# Patient Record
Sex: Female | Born: 1950
Health system: Southern US, Community
[De-identification: ages and names within clinical notes are randomized; demographics above are authoritative.]

## PROBLEM LIST (undated history)

## (undated) ENCOUNTER — Emergency Department (HOSPITAL_COMMUNITY): Admission: EM | Payer: Medicare PPO | Source: Home / Self Care

## (undated) DIAGNOSIS — R51 Headache: Secondary | ICD-10-CM

## (undated) DIAGNOSIS — I639 Cerebral infarction, unspecified: Secondary | ICD-10-CM

## (undated) DIAGNOSIS — I499 Cardiac arrhythmia, unspecified: Secondary | ICD-10-CM

## (undated) DIAGNOSIS — K219 Gastro-esophageal reflux disease without esophagitis: Secondary | ICD-10-CM

## (undated) DIAGNOSIS — N186 End stage renal disease: Secondary | ICD-10-CM

## (undated) DIAGNOSIS — K5900 Constipation, unspecified: Secondary | ICD-10-CM

## (undated) DIAGNOSIS — I10 Essential (primary) hypertension: Secondary | ICD-10-CM

## (undated) DIAGNOSIS — E785 Hyperlipidemia, unspecified: Secondary | ICD-10-CM

## (undated) DIAGNOSIS — N189 Chronic kidney disease, unspecified: Secondary | ICD-10-CM

## (undated) DIAGNOSIS — R519 Headache, unspecified: Secondary | ICD-10-CM

## (undated) DIAGNOSIS — E119 Type 2 diabetes mellitus without complications: Secondary | ICD-10-CM

## (undated) DIAGNOSIS — M1990 Unspecified osteoarthritis, unspecified site: Secondary | ICD-10-CM

## (undated) HISTORY — DX: Hyperlipidemia, unspecified: E78.5

## (undated) HISTORY — DX: Gastro-esophageal reflux disease without esophagitis: K21.9

## (undated) HISTORY — PX: ABDOMINAL HYSTERECTOMY: SHX81

## (undated) HISTORY — DX: Essential (primary) hypertension: I10

## (undated) HISTORY — DX: Type 2 diabetes mellitus without complications: E11.9

---

## 2003-11-24 HISTORY — PX: CATARACT EXTRACTION: SUR2

## 2008-05-04 ENCOUNTER — Ambulatory Visit: Payer: Self-pay | Admitting: Cardiology

## 2008-06-27 ENCOUNTER — Ambulatory Visit: Payer: Self-pay | Admitting: Cardiology

## 2008-07-04 ENCOUNTER — Ambulatory Visit: Payer: Self-pay | Admitting: Cardiology

## 2008-07-23 ENCOUNTER — Ambulatory Visit: Payer: Self-pay

## 2008-07-23 ENCOUNTER — Encounter: Payer: Self-pay | Admitting: Cardiology

## 2008-07-31 ENCOUNTER — Ambulatory Visit: Payer: Self-pay | Admitting: Cardiology

## 2008-08-07 ENCOUNTER — Inpatient Hospital Stay (HOSPITAL_BASED_OUTPATIENT_CLINIC_OR_DEPARTMENT_OTHER): Admission: RE | Admit: 2008-08-07 | Discharge: 2008-08-07 | Payer: Self-pay | Admitting: Cardiology

## 2008-08-07 ENCOUNTER — Ambulatory Visit: Payer: Self-pay | Admitting: Cardiology

## 2011-04-07 NOTE — Assessment & Plan Note (Signed)
[place] OFFICE NOTE   NAME:MCCORKLEConchita, [name]                     MRN:          [id number]  DATE:06/27/2008                            DOB:          11-21-1951    PRIMARY CARDIOLOGIST:  Ernestine Mcmurray, MD, [facility]   REASON FOR VISIT:  Scheduled followup.   [name] returns to our clinic, after an initial referral to Dr.  Dannielle Burn this past June.  She was referred for further evaluation of chest  discomfort and dyspnea, in the setting of multiple cardiac risk factors,  with no prior history of heart disease.   Initial recommendation was to pursue a noninvasive workup consisting of  an exercise stress Cardiolite, as well as a 2-D echocardiogram.  Medications were adjusted with addition of Coreg for aggressive blood  pressure control.  Dr. Dannielle Burn also noted renal insufficiency with a  creatinine of 1.75, and suggested future evaluation with a CO2  angiogram, during a possible cardiac catheterization, to rule out renal  artery stenosis.   Unfortunately, [name] was not able to proceed with the recommended  tests.  She reports that she was told by this hospital that she would  have to present with $2000 prior to undergoing the procedures.  Given  her inability to afford this payment up front, she declined the testing.   Clinically, [name] does continue to have occasional chest  discomfort.  This seems to be most pronounced when she is doing  household chores, consisting of cleaning and vacuuming.  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  She has not had any in the past few  weeks or so.  She denies any symptoms at rest.   CURRENT MEDICATIONS:  1. Carvedilol 3.125 b.i.d.  2. Aspirin 325 daily.  3. Pravastatin 20 at bedtime.  4. Hydrochlorothiazide 25 daily.  5. Benazepril 40 daily.  6. Glipizide 10 b.i.d.  7.  Amlodipine 5 daily.  8. Insulin 70/30 40 units b.i.d.   PHYSICAL EXAMINATION:  VITAL SIGNS:  Blood pressure to 200/110  initially, repeat systolic Q000111Q.  Pulse 89, weight 194.  GENERAL:  A 60 year old female, obese, sitting upright, in no distress.  HEENT:  Normocephalic, atraumatic.  NECK:  Palpable carotid pulse without bruits; unable to assess JVD  secondary to neck girth.  LUNGS:  Clear to auscultation in all fields.  HEART:  RRR.  (S1S2).  Positive S4, a soft grade [id number] systolic ejection  murmur at the base.  No diastolic blow.  No rubs.  ABDOMEN:  Protuberant, nontender, intact bowel sounds.  EXTREMITIES:  Palpable bilateral femoral pulse without bruits; intact  distal pulses with no significant edema.  NEURO:  No focal deficits.   IMPRESSION:  1. Exertional angina pectoris.      a.     Worrisome for significant underlying coronary artery       disease.  2. Hypertension, uncontrolled.  3. Insulin-dependent diabetes mellitus.  4. Dyslipidemia.  5. Status post resting sinus tachycardia, improved.  6. Renal  insufficiency.  7. Systolic murmur.   PLAN:  1. Continue aggressive blood pressure management with uptitration of      Coreg to 6.25 b.i.d., and uptitration of Norvasc to 10 daily.  2. Add Imdur 30 daily for antianginal benefit.  3. Reschedule 2-D echo for assessment of LVF and exclusion of      significant valvular disease.  4. Schedule renal ultrasound with Dopplers, to be performed in our [place], to exclude renal artery stenosis.  We prefer to do this      noninvasive study, as opposed to a CO2 angiogram in conjunction      with a possible cardiac catheterization in the near future.  5. Following stabilization of blood pressure, and rule out of      significant renal artery stenosis, then plan is to proceed with a      diagnostic coronary angiogram and possible percutaneous      intervention.  The patient does, in fact, present with symptoms      worrisome  for significant underlying CAD.  The risks/benefits of      such a procedure were discussed with the patient, who would be      willing to proceed.  Prior to scheduling this, however, we will      have the patient return for early followup, including a nurse's      visit in 1 week for a repeat BP/pulse check.  We will then arrange      for the patient to return to our clinic to follow up with myself      and Dr. Dannielle Burn in 1 month, for discussion and timing of recommended      cardiac catheterization.     Mannie Stabile, PA-C  Electronically Signed      Satira Sark, MD  Electronically Signed   GS/MedQ  DD: 06/27/2008  DT: 06/28/2008  Job #: [id number]   cc:   Consuello Masse

## 2011-04-07 NOTE — Assessment & Plan Note (Signed)
[place] OFFICE NOTE   NAME:[name]                     MRN:          [id number]  DATE:07/31/2008                            DOB:          1950/11/24    [name] is a very pleasant 60 year old female with past medical  history of hypertension, diabetes, strong family history, and  hyperlipidemia.  She has been seen by Dr. Dannielle Burn recently for chest  pain.  A noninvasive workup was scheduled, but was not performed  initially.  She was seen by Mannie Stabile on June 27, 2008, with  continuing episodes of chest pain.  Note, she does have renal  insufficiency and was concerned about this and she was also concerned  about the cause previously.  Gene did order an echocardiogram, which was  performed on July 23, 2008.  Her LV function was normal.  There was no  significant valvular disease noted.  There was mild left atrial  enlargement.  He also ordered renal Dopplers to exclude renal artery  stenosis.  Her kidney size was normal, and there was no significant  renal artery stenosis.  Since then, she has continued to have occasional  chest pain.  The pain is in the left breast area described as a  pressure.  It does not radiate.  There is no associated nausea,  vomiting, shortness of breath, or diaphoresis.  It typically occurs with  more extreme activities, but not with routine activities.  It does not  typically occur with rest.  Last episode was approximately 1 week ago.  It lasts in 5-10 minutes and resolves.  There has also been some  adjustment of her present medications.  The plan previously has been to  adjust her medical regimen and then proceed with cardiac  catheterization.   PRESENT MEDICATIONS:  1. Insulin 70/30, 40 units in the morning and 40 units in the evening.  2. Glipizide 10 mg p.o. b.i.d.  3. Benazepril 40 mg p.o. daily.  4. Hydrochlorothiazide 25 mg p.o. daily.  5. Pravachol 20  mg p.o. daily.  6. Aspirin 325 mg p.o. daily.  7. Amlodipine 10 mg p.o. daily.  8. Carvedilol 6.25 mg p.o. b.i.d.  9. Imdur 30 mg p.o. daily.   PHYSICAL EXAMINATION:  VITAL SIGNS:  Blood pressure 130/81 and her pulse  is 86.  She weighs 192 pounds.  HEENT:  Normal.  NECK:  Supple.  CHEST:  Clear.  CARDIOVASCULAR:  Regular rate and rhythm.  ABDOMEN:  No tenderness.  EXTREMITIES:  No edema.   DIAGNOSES:  1. Chest pain concerning for angina - [name] continues to have      occasional chest pain.  This typically occurs with more extreme      activities, but not at rest.  Note, her previous creatinine in      early August was 1.7.  We will plan to proceed with cardiac      catheterization.  The risks and benefits have been discussed and      she agrees to proceed.  I will hold her hydrochlorothiazide the day  after procedure and the day prior to the procedure.  We will hold      her ACE inhibitor the day of the procedure as well.  We will plan      to bring her to the [facility] lab at approximately 6 o'clock in the      morning of the procedure and begin normal saline.  Now, we will not      proceed with an left ventriculogram as previous echo showed normal      left ventricular function.  We will need to repeat her BMET the day      after her procedure.  We will make further recommendations once we      know her anatomy.  2. Hypertension - Her blood pressure is adequately control and we will      continue her present medications.  3. Diabetes mellitus - This will be managed per primary care      physician.  4. Hyperlipidemia - She will continue on her statin.  This needs to be      followed and goal LDL should be less than 70.  5. Renal insufficiency - As per chest pain concerning for angina, we      will need to follow this closely following her procedure.   We will see her back in approximately 4 weeks.  In the interim, she will  have her cardiac catheterization.      Denice Bors Stanford Breed, MD, [facility]  Electronically Signed    BSC/MedQ  DD: 07/31/2008  DT: 08/01/2008  Job #: [id number]   cc:   Consuello Masse

## 2011-04-07 NOTE — Assessment & Plan Note (Signed)
[place] CARDIOLOGY OFFICE NOTE   NAME:MCCORKLEPhilicia, [name]                     MRN:          [id number]  DATE:05/04/2008                            DOB:          Mar 05, 1951    REFERRING PHYSICIAN:  Consuello Masse   REASON FOR CONSULTATION:  Evaluation of episodes of right arm tightness  and shortness of breath and chest pain.   HISTORY OF PRESENT ILLNESS:  The patient is a very pleasant 60 year old  female with multiple cardiac risk factors including hypertension,  diabetes mellitus, strong family history, and dyslipidemia.  The  patient's blood pressure of note remains uncontrolled.  The patient  states today she is feeling well, but last Sunday she had an episode  where she awoke at 3:00 a.m. with sensation of feeling sick.  She stated  she felt bad throughout the whole day until later that evening.  Her  symptoms predominantly included sudden onset of sensation of nausea  associated with dizziness and heavy substernal chest pain with pain  radiating to the right arm.  There was also accompanying dyspnea.  She  still did go to work that day and had intermittent right arm tightness.  Later that night, eventually her symptoms resolved.  She then saw Dr.  Quintin Alto as an outpatient who urged her to have her see [name] in  consultation.  Her EKG in the office today is within normal limits, but  does show a sinus tachycardia, but also the patient state that she has  felt for quite some time that her heart has been going fast.  She does  get exhausted upon minimal exertion.  She complains of pain in both legs  upon exertion, some improvement at rest, but also occurring at rest.   ALLERGIES:  No known drug allergies.   PAST MEDICAL HISTORY:  See problem list below.   SOCIAL HISTORY:  The patient does not smoke or drink.  She lives in  [place], [place].   FAMILY HISTORY:  Notable for mother died from dysphagia and father from  cancer.  She has three sisters, which are in good health.   REVIEW OF SYSTEMS:  As per HPI.  The patient denies currently any  nausea, vomiting, fever, chills, melena, hematochezia, dysuria, or  frequency, palpitations or syncope.  The remainder of the review of  systems is negative.   PHYSICAL EXAMINATION:  VITAL SIGNS:  Blood pressure 171/97 with heart  rate of 111 beats per minute and weight is 196 pounds.  NECK:  Normal carotid upstroke and no carotid bruits.  Neck is supple.  There is no thyromegaly.  HEENT:  Oropharynx and nasopharynx are within normal limits.  LYMPHATICS:  No lymphadenopathy in the neck, axilla, or groin.  LUNGS:  Clear breath sounds bilaterally.  HEART:  Regular rate and rhythm with normal S1 and S2.  No murmurs,  rubs, or gallops, but tachycardic.  ABDOMEN:  Soft and nontender with no rebound or guarding.  Good bowel  sounds.  EXTREMITIES:  No cyanosis, clubbing, or edema.  NEURO:  The patient is alert and oriented, grossly nonfocal.  Dorsalis  pedis and posterior tibialis pulses appear to be intact bilaterally.   LIST OF MEDICATIONS:  1. Insulin 70/30, 40 units in the a.m. and 40 units in the p.m.  2. Amlodipine 5 mg p.o. daily.  3. Glipizide 10 mg p.o. b.i.d.  4. Benazepril 40 mg p.o. daily.  5. Hydrochlorothiazide 25 mg p.o. daily.  6. Pravastatin 20 mg p.o. daily.  7. Aspirin 325 mg p.o. daily.   PROBLEM LIST:  1. Dyspnea and chest pain.      a.     Rule out ischemic heart disease.      b.     Rule out hypertensive heart disease.  2. Hypertension, poorly controlled.  3. Resting tachycardia.  4. Rule out hyperthyroidism.  5. Diabetes mellitus.  6. Dyslipidemia on lipid-lowering therapy.   PLAN:  1. I do share Dr. Edythe Lynn concern that the patient could have      underlying coronary artery disease, as she has a coronary artery      disease equivalent in the setting of diabetes and other multiple      cardiac risk factors.  2. We will proceed  initially with an exercise Cardiolite stress study      and if positive, the patient will need a cardiac catheterization.  3. Further modification of risk factors is necessary, particularly      with attention to blood pressure control.  I added Coreg to the      patient's medical regimen, which should be rapidly uptitrated for      control of heart rate and hypertension.  4. The patient may need evaluation of renal artery stenosis      particularly in light of her creatinine of 1.75.  However, I did      not order any Doppler studies, but will hold off on this because      the patient still may require cardiac catheterization and renal      artery scan then be assessed at that time with a CO2 angiogram.  5. The patient reports pain in the lower extremity, but I do not think      she has claudication.  Her pulses are palpable.  Again, we will      defer any further evaluation, pending a decision whether we need to      proceed with cardiac catheterization.  6. I gave the patient a prescription of nitroglycerin p.r.n.  In case,      she has another episode as described above, she knows now to come      to the emergency room.     Ernestine Mcmurray, MD,FACC  Electronically Signed    GED/MedQ  DD: 05/04/2008  DT: 05/04/2008  Job #: [id number]   cc:   Consuello Masse

## 2011-04-07 NOTE — Cardiovascular Report (Signed)
[name], AO            ACCOUNT NO.:  [id number]   MEDICAL RECORD NO.:  [id number]          PATIENT TYPE:  OIB   LOCATION:  1967                         FACILITY:  [facility]   PHYSICIAN:  Minus Breeding, MD, FACCDATE OF BIRTH:  08-14-1951   DATE OF PROCEDURE:  08/07/2008  DATE OF DISCHARGE:  08/07/2008                            CARDIAC CATHETERIZATION   PRIMARY CARE PHYSICIAN:  Dr. Consuello Masse.   REASON FOR PRESENTATION:  The patient with coronary artery disease and  chest discomfort suggestive of unstable angina.   PROCEDURE NOTE:  Left heart catheterization was performed via the right  femoral artery.  The artery was cannulated using anterior wall puncture.  A #4-French arterial sheath was inserted via the modified Seldinger  technique.  Preformed Judkins and pigtail catheter were utilized.  The  patient tolerated the procedure well and left the lab in stable  condition.   RESULTS:  Hemodynamics; LV 124/22, A1 31/98.  Coronaries; the left main was normal and was long.  The LAD was normal.  There were 2 small diagonals that looked normal.  The circumflex in the  AV groove was normal.  The ramus intermediate was large and normal.  An  obtuse marginal 1 was large and normal.  The right coronary artery was a  dominant vessel.  There was a moderate-sized PDA, which was normal.  There was a posterolateral, which was moderate sized and normal.  The  left ventricle was not injected secondary to renal insufficiency.  We  did cross for pressures.   CONCLUSION:  Normal coronary arteries.   PLAN:  No further cardiac workup is suggested.  The patient will follow  with Dr. Quintin Alto and Dr. Stanford Breed.      Minus Breeding, MD, [facility]  Electronically Signed     JH/MEDQ  D:  11/08/2008  T:  11/08/2008  Job:  [id number]   cc:   Consuello Masse

## 2012-03-17 DIAGNOSIS — I43 Cardiomyopathy in diseases classified elsewhere: Secondary | ICD-10-CM | POA: Diagnosis not present

## 2012-03-17 DIAGNOSIS — IMO0001 Reserved for inherently not codable concepts without codable children: Secondary | ICD-10-CM | POA: Diagnosis not present

## 2012-03-17 DIAGNOSIS — E7800 Pure hypercholesterolemia, unspecified: Secondary | ICD-10-CM | POA: Diagnosis not present

## 2012-03-17 DIAGNOSIS — I10 Essential (primary) hypertension: Secondary | ICD-10-CM | POA: Diagnosis not present

## 2012-03-17 DIAGNOSIS — N189 Chronic kidney disease, unspecified: Secondary | ICD-10-CM | POA: Diagnosis not present

## 2012-03-18 DIAGNOSIS — E7800 Pure hypercholesterolemia, unspecified: Secondary | ICD-10-CM | POA: Diagnosis not present

## 2012-07-21 DIAGNOSIS — E7800 Pure hypercholesterolemia, unspecified: Secondary | ICD-10-CM | POA: Diagnosis not present

## 2012-07-21 DIAGNOSIS — I10 Essential (primary) hypertension: Secondary | ICD-10-CM | POA: Diagnosis not present

## 2012-07-21 DIAGNOSIS — N189 Chronic kidney disease, unspecified: Secondary | ICD-10-CM | POA: Diagnosis not present

## 2012-07-21 DIAGNOSIS — IMO0001 Reserved for inherently not codable concepts without codable children: Secondary | ICD-10-CM | POA: Diagnosis not present

## 2012-07-22 DIAGNOSIS — IMO0001 Reserved for inherently not codable concepts without codable children: Secondary | ICD-10-CM | POA: Diagnosis not present

## 2013-05-29 DIAGNOSIS — R404 Transient alteration of awareness: Secondary | ICD-10-CM | POA: Diagnosis not present

## 2013-05-29 DIAGNOSIS — I10 Essential (primary) hypertension: Secondary | ICD-10-CM | POA: Diagnosis not present

## 2013-05-29 DIAGNOSIS — Z794 Long term (current) use of insulin: Secondary | ICD-10-CM | POA: Diagnosis not present

## 2013-05-29 DIAGNOSIS — N289 Disorder of kidney and ureter, unspecified: Secondary | ICD-10-CM | POA: Diagnosis not present

## 2013-05-29 DIAGNOSIS — E1169 Type 2 diabetes mellitus with other specified complication: Secondary | ICD-10-CM | POA: Diagnosis not present

## 2013-07-27 DIAGNOSIS — IMO0001 Reserved for inherently not codable concepts without codable children: Secondary | ICD-10-CM | POA: Diagnosis not present

## 2013-07-27 DIAGNOSIS — I43 Cardiomyopathy in diseases classified elsewhere: Secondary | ICD-10-CM | POA: Diagnosis not present

## 2013-07-27 DIAGNOSIS — N189 Chronic kidney disease, unspecified: Secondary | ICD-10-CM | POA: Diagnosis not present

## 2013-07-27 DIAGNOSIS — Z23 Encounter for immunization: Secondary | ICD-10-CM | POA: Diagnosis not present

## 2013-07-27 DIAGNOSIS — I10 Essential (primary) hypertension: Secondary | ICD-10-CM | POA: Diagnosis not present

## 2013-07-27 DIAGNOSIS — E7800 Pure hypercholesterolemia, unspecified: Secondary | ICD-10-CM | POA: Diagnosis not present

## 2013-07-28 DIAGNOSIS — IMO0001 Reserved for inherently not codable concepts without codable children: Secondary | ICD-10-CM | POA: Diagnosis not present

## 2013-07-28 DIAGNOSIS — E119 Type 2 diabetes mellitus without complications: Secondary | ICD-10-CM | POA: Diagnosis not present

## 2013-07-28 DIAGNOSIS — N189 Chronic kidney disease, unspecified: Secondary | ICD-10-CM | POA: Diagnosis not present

## 2013-07-28 DIAGNOSIS — I10 Essential (primary) hypertension: Secondary | ICD-10-CM | POA: Diagnosis not present

## 2013-07-28 DIAGNOSIS — E7800 Pure hypercholesterolemia, unspecified: Secondary | ICD-10-CM | POA: Diagnosis not present

## 2013-11-21 DIAGNOSIS — E7800 Pure hypercholesterolemia, unspecified: Secondary | ICD-10-CM | POA: Diagnosis not present

## 2013-11-21 DIAGNOSIS — I43 Cardiomyopathy in diseases classified elsewhere: Secondary | ICD-10-CM | POA: Diagnosis not present

## 2013-11-21 DIAGNOSIS — IMO0001 Reserved for inherently not codable concepts without codable children: Secondary | ICD-10-CM | POA: Diagnosis not present

## 2013-11-21 DIAGNOSIS — I10 Essential (primary) hypertension: Secondary | ICD-10-CM | POA: Diagnosis not present

## 2013-11-21 DIAGNOSIS — N189 Chronic kidney disease, unspecified: Secondary | ICD-10-CM | POA: Diagnosis not present

## 2013-11-28 DIAGNOSIS — E7800 Pure hypercholesterolemia, unspecified: Secondary | ICD-10-CM | POA: Diagnosis not present

## 2013-11-28 DIAGNOSIS — I43 Cardiomyopathy in diseases classified elsewhere: Secondary | ICD-10-CM | POA: Diagnosis not present

## 2013-11-28 DIAGNOSIS — I10 Essential (primary) hypertension: Secondary | ICD-10-CM | POA: Diagnosis not present

## 2013-11-28 DIAGNOSIS — IMO0001 Reserved for inherently not codable concepts without codable children: Secondary | ICD-10-CM | POA: Diagnosis not present

## 2013-11-28 DIAGNOSIS — N189 Chronic kidney disease, unspecified: Secondary | ICD-10-CM | POA: Diagnosis not present

## 2014-03-23 DIAGNOSIS — N189 Chronic kidney disease, unspecified: Secondary | ICD-10-CM | POA: Diagnosis not present

## 2014-03-23 DIAGNOSIS — IMO0001 Reserved for inherently not codable concepts without codable children: Secondary | ICD-10-CM | POA: Diagnosis not present

## 2014-03-23 DIAGNOSIS — I10 Essential (primary) hypertension: Secondary | ICD-10-CM | POA: Diagnosis not present

## 2014-03-23 DIAGNOSIS — E7800 Pure hypercholesterolemia, unspecified: Secondary | ICD-10-CM | POA: Diagnosis not present

## 2014-03-29 DIAGNOSIS — E7800 Pure hypercholesterolemia, unspecified: Secondary | ICD-10-CM | POA: Diagnosis not present

## 2014-03-29 DIAGNOSIS — I10 Essential (primary) hypertension: Secondary | ICD-10-CM | POA: Diagnosis not present

## 2014-03-29 DIAGNOSIS — IMO0001 Reserved for inherently not codable concepts without codable children: Secondary | ICD-10-CM | POA: Diagnosis not present

## 2014-03-29 DIAGNOSIS — N189 Chronic kidney disease, unspecified: Secondary | ICD-10-CM | POA: Diagnosis not present

## 2014-03-29 DIAGNOSIS — I43 Cardiomyopathy in diseases classified elsewhere: Secondary | ICD-10-CM | POA: Diagnosis not present

## 2014-07-23 DIAGNOSIS — IMO0001 Reserved for inherently not codable concepts without codable children: Secondary | ICD-10-CM | POA: Diagnosis not present

## 2014-07-23 DIAGNOSIS — E7800 Pure hypercholesterolemia, unspecified: Secondary | ICD-10-CM | POA: Diagnosis not present

## 2014-07-23 DIAGNOSIS — I43 Cardiomyopathy in diseases classified elsewhere: Secondary | ICD-10-CM | POA: Diagnosis not present

## 2014-07-23 DIAGNOSIS — I10 Essential (primary) hypertension: Secondary | ICD-10-CM | POA: Diagnosis not present

## 2014-07-23 DIAGNOSIS — N189 Chronic kidney disease, unspecified: Secondary | ICD-10-CM | POA: Diagnosis not present

## 2014-07-31 DIAGNOSIS — I43 Cardiomyopathy in diseases classified elsewhere: Secondary | ICD-10-CM | POA: Diagnosis not present

## 2014-07-31 DIAGNOSIS — N189 Chronic kidney disease, unspecified: Secondary | ICD-10-CM | POA: Diagnosis not present

## 2014-07-31 DIAGNOSIS — I10 Essential (primary) hypertension: Secondary | ICD-10-CM | POA: Diagnosis not present

## 2014-07-31 DIAGNOSIS — E7800 Pure hypercholesterolemia, unspecified: Secondary | ICD-10-CM | POA: Diagnosis not present

## 2014-07-31 DIAGNOSIS — IMO0001 Reserved for inherently not codable concepts without codable children: Secondary | ICD-10-CM | POA: Diagnosis not present

## 2014-09-25 DIAGNOSIS — S161XXA Strain of muscle, fascia and tendon at neck level, initial encounter: Secondary | ICD-10-CM | POA: Diagnosis not present

## 2014-11-19 DIAGNOSIS — E780 Pure hypercholesterolemia: Secondary | ICD-10-CM | POA: Diagnosis not present

## 2014-11-19 DIAGNOSIS — E1165 Type 2 diabetes mellitus with hyperglycemia: Secondary | ICD-10-CM | POA: Diagnosis not present

## 2014-11-19 DIAGNOSIS — I10 Essential (primary) hypertension: Secondary | ICD-10-CM | POA: Diagnosis not present

## 2014-11-28 DIAGNOSIS — Z1389 Encounter for screening for other disorder: Secondary | ICD-10-CM | POA: Diagnosis not present

## 2014-11-28 DIAGNOSIS — E780 Pure hypercholesterolemia: Secondary | ICD-10-CM | POA: Diagnosis not present

## 2014-11-28 DIAGNOSIS — I43 Cardiomyopathy in diseases classified elsewhere: Secondary | ICD-10-CM | POA: Diagnosis not present

## 2014-11-28 DIAGNOSIS — I10 Essential (primary) hypertension: Secondary | ICD-10-CM | POA: Diagnosis not present

## 2014-11-28 DIAGNOSIS — N184 Chronic kidney disease, stage 4 (severe): Secondary | ICD-10-CM | POA: Diagnosis not present

## 2014-11-28 DIAGNOSIS — E1165 Type 2 diabetes mellitus with hyperglycemia: Secondary | ICD-10-CM | POA: Diagnosis not present

## 2015-04-01 DIAGNOSIS — I10 Essential (primary) hypertension: Secondary | ICD-10-CM | POA: Diagnosis not present

## 2015-04-01 DIAGNOSIS — E780 Pure hypercholesterolemia: Secondary | ICD-10-CM | POA: Diagnosis not present

## 2015-04-01 DIAGNOSIS — E1165 Type 2 diabetes mellitus with hyperglycemia: Secondary | ICD-10-CM | POA: Diagnosis not present

## 2015-04-01 DIAGNOSIS — N184 Chronic kidney disease, stage 4 (severe): Secondary | ICD-10-CM | POA: Diagnosis not present

## 2015-04-04 DIAGNOSIS — I10 Essential (primary) hypertension: Secondary | ICD-10-CM | POA: Diagnosis not present

## 2015-04-04 DIAGNOSIS — E780 Pure hypercholesterolemia: Secondary | ICD-10-CM | POA: Diagnosis not present

## 2015-04-04 DIAGNOSIS — N184 Chronic kidney disease, stage 4 (severe): Secondary | ICD-10-CM | POA: Diagnosis not present

## 2015-04-04 DIAGNOSIS — E1165 Type 2 diabetes mellitus with hyperglycemia: Secondary | ICD-10-CM | POA: Diagnosis not present

## 2015-04-04 DIAGNOSIS — Z23 Encounter for immunization: Secondary | ICD-10-CM | POA: Diagnosis not present

## 2015-04-04 DIAGNOSIS — I43 Cardiomyopathy in diseases classified elsewhere: Secondary | ICD-10-CM | POA: Diagnosis not present

## 2015-08-06 DIAGNOSIS — I10 Essential (primary) hypertension: Secondary | ICD-10-CM | POA: Diagnosis not present

## 2015-08-06 DIAGNOSIS — N184 Chronic kidney disease, stage 4 (severe): Secondary | ICD-10-CM | POA: Diagnosis not present

## 2015-08-06 DIAGNOSIS — E1165 Type 2 diabetes mellitus with hyperglycemia: Secondary | ICD-10-CM | POA: Diagnosis not present

## 2015-08-06 DIAGNOSIS — E780 Pure hypercholesterolemia: Secondary | ICD-10-CM | POA: Diagnosis not present

## 2015-08-13 DIAGNOSIS — I10 Essential (primary) hypertension: Secondary | ICD-10-CM | POA: Diagnosis not present

## 2015-08-13 DIAGNOSIS — E1122 Type 2 diabetes mellitus with diabetic chronic kidney disease: Secondary | ICD-10-CM | POA: Diagnosis not present

## 2015-08-13 DIAGNOSIS — N184 Chronic kidney disease, stage 4 (severe): Secondary | ICD-10-CM | POA: Diagnosis not present

## 2015-08-13 DIAGNOSIS — E1165 Type 2 diabetes mellitus with hyperglycemia: Secondary | ICD-10-CM | POA: Diagnosis not present

## 2015-08-13 DIAGNOSIS — I43 Cardiomyopathy in diseases classified elsewhere: Secondary | ICD-10-CM | POA: Diagnosis not present

## 2015-08-13 DIAGNOSIS — K210 Gastro-esophageal reflux disease with esophagitis: Secondary | ICD-10-CM | POA: Diagnosis not present

## 2015-08-13 DIAGNOSIS — H6983 Other specified disorders of Eustachian tube, bilateral: Secondary | ICD-10-CM | POA: Diagnosis not present

## 2015-08-13 DIAGNOSIS — E780 Pure hypercholesterolemia: Secondary | ICD-10-CM | POA: Diagnosis not present

## 2015-08-13 DIAGNOSIS — R1314 Dysphagia, pharyngoesophageal phase: Secondary | ICD-10-CM | POA: Diagnosis not present

## 2015-09-12 DIAGNOSIS — Z23 Encounter for immunization: Secondary | ICD-10-CM | POA: Diagnosis not present

## 2015-10-14 DIAGNOSIS — R1310 Dysphagia, unspecified: Secondary | ICD-10-CM | POA: Diagnosis not present

## 2015-11-11 DIAGNOSIS — R1310 Dysphagia, unspecified: Secondary | ICD-10-CM | POA: Diagnosis not present

## 2015-11-13 DIAGNOSIS — M1990 Unspecified osteoarthritis, unspecified site: Secondary | ICD-10-CM | POA: Diagnosis not present

## 2015-11-13 DIAGNOSIS — Z7982 Long term (current) use of aspirin: Secondary | ICD-10-CM | POA: Diagnosis not present

## 2015-11-13 DIAGNOSIS — T189XXA Foreign body of alimentary tract, part unspecified, initial encounter: Secondary | ICD-10-CM | POA: Diagnosis not present

## 2015-11-13 DIAGNOSIS — K449 Diaphragmatic hernia without obstruction or gangrene: Secondary | ICD-10-CM | POA: Diagnosis not present

## 2015-11-13 DIAGNOSIS — K222 Esophageal obstruction: Secondary | ICD-10-CM | POA: Diagnosis not present

## 2015-11-13 DIAGNOSIS — Z8249 Family history of ischemic heart disease and other diseases of the circulatory system: Secondary | ICD-10-CM | POA: Diagnosis not present

## 2015-11-13 DIAGNOSIS — G629 Polyneuropathy, unspecified: Secondary | ICD-10-CM | POA: Diagnosis not present

## 2015-11-13 DIAGNOSIS — I10 Essential (primary) hypertension: Secondary | ICD-10-CM | POA: Diagnosis not present

## 2015-11-13 DIAGNOSIS — K219 Gastro-esophageal reflux disease without esophagitis: Secondary | ICD-10-CM | POA: Diagnosis not present

## 2015-11-13 DIAGNOSIS — Z79899 Other long term (current) drug therapy: Secondary | ICD-10-CM | POA: Diagnosis not present

## 2015-11-13 DIAGNOSIS — E119 Type 2 diabetes mellitus without complications: Secondary | ICD-10-CM | POA: Diagnosis not present

## 2015-11-13 DIAGNOSIS — R1310 Dysphagia, unspecified: Secondary | ICD-10-CM | POA: Diagnosis not present

## 2015-11-13 DIAGNOSIS — E785 Hyperlipidemia, unspecified: Secondary | ICD-10-CM | POA: Diagnosis not present

## 2015-11-13 DIAGNOSIS — Z794 Long term (current) use of insulin: Secondary | ICD-10-CM | POA: Diagnosis not present

## 2015-11-13 DIAGNOSIS — Z833 Family history of diabetes mellitus: Secondary | ICD-10-CM | POA: Diagnosis not present

## 2015-11-13 DIAGNOSIS — Z7951 Long term (current) use of inhaled steroids: Secondary | ICD-10-CM | POA: Diagnosis not present

## 2018-03-03 DIAGNOSIS — N184 Chronic kidney disease, stage 4 (severe): Secondary | ICD-10-CM | POA: Diagnosis present

## 2018-03-03 DIAGNOSIS — I129 Hypertensive chronic kidney disease with stage 1 through stage 4 chronic kidney disease, or unspecified chronic kidney disease: Secondary | ICD-10-CM | POA: Diagnosis not present

## 2018-03-03 DIAGNOSIS — N189 Chronic kidney disease, unspecified: Secondary | ICD-10-CM | POA: Diagnosis not present

## 2018-03-03 DIAGNOSIS — E11649 Type 2 diabetes mellitus with hypoglycemia without coma: Secondary | ICD-10-CM | POA: Diagnosis not present

## 2018-03-03 DIAGNOSIS — N179 Acute kidney failure, unspecified: Secondary | ICD-10-CM | POA: Diagnosis not present

## 2018-03-03 DIAGNOSIS — Z794 Long term (current) use of insulin: Secondary | ICD-10-CM | POA: Diagnosis not present

## 2018-03-03 DIAGNOSIS — K219 Gastro-esophageal reflux disease without esophagitis: Secondary | ICD-10-CM | POA: Diagnosis not present

## 2018-03-03 DIAGNOSIS — R531 Weakness: Secondary | ICD-10-CM | POA: Diagnosis present

## 2018-03-03 DIAGNOSIS — R4182 Altered mental status, unspecified: Secondary | ICD-10-CM | POA: Diagnosis not present

## 2018-03-03 DIAGNOSIS — D649 Anemia, unspecified: Secondary | ICD-10-CM | POA: Diagnosis not present

## 2018-03-03 DIAGNOSIS — R809 Proteinuria, unspecified: Secondary | ICD-10-CM | POA: Diagnosis not present

## 2018-03-03 DIAGNOSIS — I10 Essential (primary) hypertension: Secondary | ICD-10-CM | POA: Diagnosis not present

## 2018-03-03 DIAGNOSIS — R42 Dizziness and giddiness: Secondary | ICD-10-CM | POA: Diagnosis not present

## 2018-03-03 DIAGNOSIS — M1990 Unspecified osteoarthritis, unspecified site: Secondary | ICD-10-CM | POA: Diagnosis present

## 2018-03-03 DIAGNOSIS — E1122 Type 2 diabetes mellitus with diabetic chronic kidney disease: Secondary | ICD-10-CM | POA: Diagnosis not present

## 2018-03-03 DIAGNOSIS — E785 Hyperlipidemia, unspecified: Secondary | ICD-10-CM | POA: Diagnosis present

## 2018-03-03 DIAGNOSIS — E1129 Type 2 diabetes mellitus with other diabetic kidney complication: Secondary | ICD-10-CM | POA: Diagnosis not present

## 2018-03-03 DIAGNOSIS — E1140 Type 2 diabetes mellitus with diabetic neuropathy, unspecified: Secondary | ICD-10-CM | POA: Diagnosis present

## 2018-03-03 DIAGNOSIS — Z79899 Other long term (current) drug therapy: Secondary | ICD-10-CM | POA: Diagnosis not present

## 2018-03-03 DIAGNOSIS — Z7982 Long term (current) use of aspirin: Secondary | ICD-10-CM | POA: Diagnosis not present

## 2018-03-03 DIAGNOSIS — I160 Hypertensive urgency: Secondary | ICD-10-CM | POA: Diagnosis present

## 2018-03-28 DIAGNOSIS — R809 Proteinuria, unspecified: Secondary | ICD-10-CM | POA: Diagnosis not present

## 2018-03-28 DIAGNOSIS — Z79899 Other long term (current) drug therapy: Secondary | ICD-10-CM | POA: Diagnosis not present

## 2018-03-28 DIAGNOSIS — E559 Vitamin D deficiency, unspecified: Secondary | ICD-10-CM | POA: Diagnosis not present

## 2018-03-28 DIAGNOSIS — N183 Chronic kidney disease, stage 3 (moderate): Secondary | ICD-10-CM | POA: Diagnosis not present

## 2018-03-28 DIAGNOSIS — D509 Iron deficiency anemia, unspecified: Secondary | ICD-10-CM | POA: Diagnosis not present

## 2018-03-29 DIAGNOSIS — R809 Proteinuria, unspecified: Secondary | ICD-10-CM | POA: Diagnosis not present

## 2018-03-29 DIAGNOSIS — M9080 Osteopathy in diseases classified elsewhere, unspecified site: Secondary | ICD-10-CM | POA: Diagnosis not present

## 2018-03-29 DIAGNOSIS — D631 Anemia in chronic kidney disease: Secondary | ICD-10-CM | POA: Diagnosis not present

## 2018-03-29 DIAGNOSIS — N185 Chronic kidney disease, stage 5: Secondary | ICD-10-CM | POA: Diagnosis not present

## 2018-03-29 DIAGNOSIS — E889 Metabolic disorder, unspecified: Secondary | ICD-10-CM | POA: Diagnosis not present

## 2018-03-29 DIAGNOSIS — E1121 Type 2 diabetes mellitus with diabetic nephropathy: Secondary | ICD-10-CM | POA: Diagnosis not present

## 2018-03-29 DIAGNOSIS — I10 Essential (primary) hypertension: Secondary | ICD-10-CM | POA: Diagnosis not present

## 2018-03-29 DIAGNOSIS — E875 Hyperkalemia: Secondary | ICD-10-CM | POA: Diagnosis not present

## 2018-04-03 DIAGNOSIS — I10 Essential (primary) hypertension: Secondary | ICD-10-CM | POA: Insufficient documentation

## 2018-04-03 DIAGNOSIS — E1121 Type 2 diabetes mellitus with diabetic nephropathy: Secondary | ICD-10-CM | POA: Insufficient documentation

## 2018-04-03 DIAGNOSIS — D631 Anemia in chronic kidney disease: Secondary | ICD-10-CM | POA: Insufficient documentation

## 2018-04-03 DIAGNOSIS — N189 Chronic kidney disease, unspecified: Secondary | ICD-10-CM | POA: Insufficient documentation

## 2018-04-06 DIAGNOSIS — E1122 Type 2 diabetes mellitus with diabetic chronic kidney disease: Secondary | ICD-10-CM | POA: Diagnosis not present

## 2018-04-06 DIAGNOSIS — E1165 Type 2 diabetes mellitus with hyperglycemia: Secondary | ICD-10-CM | POA: Diagnosis not present

## 2018-04-06 DIAGNOSIS — I10 Essential (primary) hypertension: Secondary | ICD-10-CM | POA: Diagnosis not present

## 2018-04-06 DIAGNOSIS — N184 Chronic kidney disease, stage 4 (severe): Secondary | ICD-10-CM | POA: Diagnosis not present

## 2018-04-06 DIAGNOSIS — Z6827 Body mass index (BMI) 27.0-27.9, adult: Secondary | ICD-10-CM | POA: Diagnosis not present

## 2018-04-11 DIAGNOSIS — N183 Chronic kidney disease, stage 3 (moderate): Secondary | ICD-10-CM | POA: Diagnosis not present

## 2018-04-11 DIAGNOSIS — D509 Iron deficiency anemia, unspecified: Secondary | ICD-10-CM | POA: Diagnosis not present

## 2018-04-11 DIAGNOSIS — R809 Proteinuria, unspecified: Secondary | ICD-10-CM | POA: Diagnosis not present

## 2018-04-11 DIAGNOSIS — E559 Vitamin D deficiency, unspecified: Secondary | ICD-10-CM | POA: Diagnosis not present

## 2018-04-11 DIAGNOSIS — Z79899 Other long term (current) drug therapy: Secondary | ICD-10-CM | POA: Diagnosis not present

## 2018-04-12 DIAGNOSIS — E1121 Type 2 diabetes mellitus with diabetic nephropathy: Secondary | ICD-10-CM | POA: Diagnosis not present

## 2018-04-12 DIAGNOSIS — D631 Anemia in chronic kidney disease: Secondary | ICD-10-CM | POA: Diagnosis not present

## 2018-04-12 DIAGNOSIS — R809 Proteinuria, unspecified: Secondary | ICD-10-CM | POA: Diagnosis not present

## 2018-04-12 DIAGNOSIS — E875 Hyperkalemia: Secondary | ICD-10-CM | POA: Diagnosis not present

## 2018-04-12 DIAGNOSIS — E889 Metabolic disorder, unspecified: Secondary | ICD-10-CM | POA: Diagnosis not present

## 2018-04-12 DIAGNOSIS — I10 Essential (primary) hypertension: Secondary | ICD-10-CM | POA: Diagnosis not present

## 2018-04-12 DIAGNOSIS — M9080 Osteopathy in diseases classified elsewhere, unspecified site: Secondary | ICD-10-CM | POA: Diagnosis not present

## 2018-04-12 DIAGNOSIS — N185 Chronic kidney disease, stage 5: Secondary | ICD-10-CM | POA: Diagnosis not present

## 2018-04-13 DIAGNOSIS — E875 Hyperkalemia: Secondary | ICD-10-CM | POA: Diagnosis not present

## 2018-05-03 ENCOUNTER — Other Ambulatory Visit: Payer: Self-pay

## 2018-05-03 DIAGNOSIS — N185 Chronic kidney disease, stage 5: Secondary | ICD-10-CM

## 2018-05-24 DIAGNOSIS — D638 Anemia in other chronic diseases classified elsewhere: Secondary | ICD-10-CM | POA: Diagnosis not present

## 2018-05-24 DIAGNOSIS — N185 Chronic kidney disease, stage 5: Secondary | ICD-10-CM | POA: Diagnosis not present

## 2018-05-24 DIAGNOSIS — E875 Hyperkalemia: Secondary | ICD-10-CM | POA: Diagnosis not present

## 2018-05-24 DIAGNOSIS — E889 Metabolic disorder, unspecified: Secondary | ICD-10-CM | POA: Diagnosis not present

## 2018-05-24 DIAGNOSIS — M9080 Osteopathy in diseases classified elsewhere, unspecified site: Secondary | ICD-10-CM | POA: Diagnosis not present

## 2018-06-07 ENCOUNTER — Ambulatory Visit (HOSPITAL_COMMUNITY)
Admission: RE | Admit: 2018-06-07 | Discharge: 2018-06-07 | Disposition: A | Discharge status: Home / Self Care | Payer: Medicare PPO | Source: Ambulatory Visit | Attending: Vascular Surgery | Admitting: Vascular Surgery

## 2018-06-07 ENCOUNTER — Ambulatory Visit (INDEPENDENT_AMBULATORY_CARE_PROVIDER_SITE_OTHER)
Admission: RE | Admit: 2018-06-07 | Discharge: 2018-06-07 | Disposition: A | Discharge status: Home / Self Care | Payer: Medicare PPO | Source: Ambulatory Visit | Attending: Vascular Surgery | Admitting: Vascular Surgery

## 2018-06-07 ENCOUNTER — Other Ambulatory Visit: Payer: Self-pay

## 2018-06-07 ENCOUNTER — Other Ambulatory Visit: Payer: Self-pay | Admitting: *Deleted

## 2018-06-07 ENCOUNTER — Encounter: Payer: Self-pay | Admitting: Vascular Surgery

## 2018-06-07 ENCOUNTER — Encounter: Payer: Self-pay | Admitting: *Deleted

## 2018-06-07 ENCOUNTER — Ambulatory Visit: Payer: Medicare PPO | Admitting: Vascular Surgery

## 2018-06-07 VITALS — BP 186/112 | HR 97 | Temp 97.9°F | Resp 16 | Ht 67.0 in | Wt 154.0 lb

## 2018-06-07 DIAGNOSIS — N185 Chronic kidney disease, stage 5: Secondary | ICD-10-CM

## 2018-06-07 DIAGNOSIS — Z01818 Encounter for other preprocedural examination: Secondary | ICD-10-CM | POA: Diagnosis not present

## 2018-06-07 DIAGNOSIS — I70208 Unspecified atherosclerosis of native arteries of extremities, other extremity: Secondary | ICD-10-CM | POA: Insufficient documentation

## 2018-06-07 DIAGNOSIS — I82713 Chronic embolism and thrombosis of superficial veins of upper extremity, bilateral: Secondary | ICD-10-CM | POA: Insufficient documentation

## 2018-06-07 DIAGNOSIS — I120 Hypertensive chronic kidney disease with stage 5 chronic kidney disease or end stage renal disease: Secondary | ICD-10-CM | POA: Insufficient documentation

## 2018-06-07 NOTE — Progress Notes (Signed)
Vascular and Vein Specialist of [facility]  Patient name: [name] MRN: [id number] DOB: May 15, 1951 Sex: female  REASON FOR CONSULT: Gus access for hemodialysis  HPI: [name] is a 67 y.o. female, who is here today for discussion of access for hemodialysis.  She is not currently on hemodialysis but is approaching need for this.  She is here today with her sister-in-law.  She has had no prior access placement and does not have a pacemaker.  She is right-handed.  She does not have any uremic symptoms currently.  Past Medical History:  Diagnosis Date  . Diabetes mellitus without complication ([place])   . GERD (gastroesophageal reflux disease)   . Hyperlipidemia   . Hypertension     History reviewed. No pertinent family history.  SOCIAL HISTORY: Social History   Socioeconomic History  . Marital status: Single    Spouse name: Not on file  . Number of children: Not on file  . Years of education: Not on file  . Highest education level: Not on file  Occupational History  . Not on file  Social Needs  . Financial resource strain: Not on file  . Food insecurity:    Worry: Not on file    Inability: Not on file  . Transportation needs:    Medical: Not on file    Non-medical: Not on file  Tobacco Use  . Smoking status: Never Smoker  . Smokeless tobacco: Never Used  Substance and Sexual Activity  . Alcohol use: Never    Frequency: Never  . Drug use: Never  . Sexual activity: Not on file  Lifestyle  . Physical activity:    Days per week: Not on file    Minutes per session: Not on file  . Stress: Not on file  Relationships  . Social connections:    Talks on phone: Not on file    Gets together: Not on file    Attends religious service: Not on file    Active member of club or organization: Not on file    Attends meetings of clubs or organizations: Not on file    Relationship status: Not on file  . Intimate partner violence:   Fear of current or ex partner: Not on file    Emotionally abused: Not on file    Physically abused: Not on file    Forced sexual activity: Not on file  Other Topics Concern  . Not on file  Social History Narrative  . Not on file    No Known Allergies  Current Outpatient Medications  Medication Sig Dispense Refill  . amlodipine-atorvastatin (CADUET) 10-10 MG tablet Take 1 tablet by mouth daily.    Marland Kitchen aspirin EC 81 MG tablet Take 81 mg by mouth daily.    . calcitRIOL (ROCALTROL) 0.25 MCG capsule Take 0.25 mcg by mouth daily.    . calcium-vitamin D (OSCAL WITH D) 500-200 MG-UNIT tablet Take 1 tablet by mouth 2 (two) times daily.    . fluticasone (FLONASE) 50 MCG/ACT nasal spray Place into both nostrils once as needed for allergies or rhinitis.    Marland Kitchen insulin NPH-regular Human (NOVOLIN 70/30) (70-30) 100 UNIT/ML injection Inject 40 Units into the skin at bedtime.    . isosorbide mononitrate (IMDUR) 30 MG 24 hr tablet Take 30 mg by mouth daily.    . pantoprazole (PROTONIX) 40 MG tablet Take 40 mg by mouth daily.    . VELTASSA 8.4 g packet Take by mouth daily.     No  current facility-administered medications for this visit.     REVIEW OF SYSTEMS:  [X]  denotes positive finding, [ ]  denotes negative finding Cardiac  Comments:  Chest pain or chest pressure: x   Shortness of breath upon exertion: x   Short of breath when lying flat:    Irregular heart rhythm:        Vascular    Pain in calf, thigh, or hip brought on by ambulation: x   Pain in feet at night that wakes you up from your sleep:     Blood clot in your veins:    Leg swelling:         Pulmonary    Oxygen at home:    Productive cough:     Wheezing:         Neurologic    Sudden weakness in arms or legs:  x   Sudden numbness in arms or legs:     Sudden onset of difficulty speaking or slurred speech:    Temporary loss of vision in one eye:     Problems with dizziness:         Gastrointestinal    Blood in stool:       Vomited blood:         Genitourinary    Burning when urinating:     Blood in urine:        Psychiatric    Major depression:         Hematologic    Bleeding problems:    Problems with blood clotting too easily:        Skin    Rashes or ulcers:        Constitutional    Fever or chills:      PHYSICAL EXAM: Vitals:   06/07/18 1046 06/07/18 1049  BP: (!) 173/106 (!) 186/112  Pulse: 97   Resp: 16   Temp: 97.9 F (36.6 C)   TempSrc: Oral   SpO2: 100%   Weight: 154 lb (69.9 kg)   Height: 5\' 7"  (1.702 m)     GENERAL: The patient is a well-nourished female, in no acute distress. The vital signs are documented above. CARDIOVASCULAR: 2+ radial pulses bilaterally.  Very small surface veins bilaterally.  Carotid arteries without bruits bilaterally PULMONARY: There is good air exchange  ABDOMEN: Soft and non-tender  MUSCULOSKELETAL: There are no major deformities or cyanosis. NEUROLOGIC: No focal weakness or paresthesias are detected. SKIN: There are no ulcers or rashes noted. PSYCHIATRIC: The patient has a normal affect.  DATA:  Noninvasive arterial studies reveal triphasic waveforms in the brachial artery and radial artery bilaterally.  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  Vein mapping shows extremely small cephalic and basilic veins bilaterally.  There is some chronic thrombus at the antecubital vein bilaterally as well  MEDICAL ISSUES: Very long discussion with patient and her sister present.  Splane options to include catheter, AV graft and AV fistula.  I reimaged her veins with SonoSite ultrasound and agree that her veins are extremely small and not acceptable for fistula attempt.  I have recommended left arm AV graft as she is right-hand dominant.  Explained the procedure as an outpatient and the expected ongoing maintenance of the access with occasional thrombo-thrombotic events.  She understands and we will schedule this  as an outpatient at her earliest [place]. Cyncere Sontag, MD FACS [facility] Specialists of [facility] Tel [phone])  [phone] Pager [phone]

## 2018-06-07 NOTE — H&P (View-Only) (Signed)
Vascular and Vein Specialist of [facility]  Patient name: [name] MRN: [id number] DOB: March 01, 1951 Sex: female  REASON FOR CONSULT: Gus access for hemodialysis  HPI: [name] is a 67 y.o. female, who is here today for discussion of access for hemodialysis.  She is not currently on hemodialysis but is approaching need for this.  She is here today with her sister-in-law.  She has had no prior access placement and does not have a pacemaker.  She is right-handed.  She does not have any uremic symptoms currently.  Past Medical History:  Diagnosis Date  . Diabetes mellitus without complication ([place])   . GERD (gastroesophageal reflux disease)   . Hyperlipidemia   . Hypertension     History reviewed. No pertinent family history.  SOCIAL HISTORY: Social History   Socioeconomic History  . Marital status: Single    Spouse name: Not on file  . Number of children: Not on file  . Years of education: Not on file  . Highest education level: Not on file  Occupational History  . Not on file  Social Needs  . Financial resource strain: Not on file  . Food insecurity:    Worry: Not on file    Inability: Not on file  . Transportation needs:    Medical: Not on file    Non-medical: Not on file  Tobacco Use  . Smoking status: Never Smoker  . Smokeless tobacco: Never Used  Substance and Sexual Activity  . Alcohol use: Never    Frequency: Never  . Drug use: Never  . Sexual activity: Not on file  Lifestyle  . Physical activity:    Days per week: Not on file    Minutes per session: Not on file  . Stress: Not on file  Relationships  . Social connections:    Talks on phone: Not on file    Gets together: Not on file    Attends religious service: Not on file    Active member of club or organization: Not on file    Attends meetings of clubs or organizations: Not on file    Relationship status: Not on file  . Intimate partner violence:   Fear of current or ex partner: Not on file    Emotionally abused: Not on file    Physically abused: Not on file    Forced sexual activity: Not on file  Other Topics Concern  . Not on file  Social History Narrative  . Not on file    No Known Allergies  Current Outpatient Medications  Medication Sig Dispense Refill  . amlodipine-atorvastatin (CADUET) 10-10 MG tablet Take 1 tablet by mouth daily.    Marland Kitchen aspirin EC 81 MG tablet Take 81 mg by mouth daily.    . calcitRIOL (ROCALTROL) 0.25 MCG capsule Take 0.25 mcg by mouth daily.    . calcium-vitamin D (OSCAL WITH D) 500-200 MG-UNIT tablet Take 1 tablet by mouth 2 (two) times daily.    . fluticasone (FLONASE) 50 MCG/ACT nasal spray Place into both nostrils once as needed for allergies or rhinitis.    Marland Kitchen insulin NPH-regular Human (NOVOLIN 70/30) (70-30) 100 UNIT/ML injection Inject 40 Units into the skin at bedtime.    . isosorbide mononitrate (IMDUR) 30 MG 24 hr tablet Take 30 mg by mouth daily.    . pantoprazole (PROTONIX) 40 MG tablet Take 40 mg by mouth daily.    . VELTASSA 8.4 g packet Take by mouth daily.     No  current facility-administered medications for this visit.     REVIEW OF SYSTEMS:  [X]  denotes positive finding, [ ]  denotes negative finding Cardiac  Comments:  Chest pain or chest pressure: x   Shortness of breath upon exertion: x   Short of breath when lying flat:    Irregular heart rhythm:        Vascular    Pain in calf, thigh, or hip brought on by ambulation: x   Pain in feet at night that wakes you up from your sleep:     Blood clot in your veins:    Leg swelling:         Pulmonary    Oxygen at home:    Productive cough:     Wheezing:         Neurologic    Sudden weakness in arms or legs:  x   Sudden numbness in arms or legs:     Sudden onset of difficulty speaking or slurred speech:    Temporary loss of vision in one eye:     Problems with dizziness:         Gastrointestinal    Blood in stool:       Vomited blood:         Genitourinary    Burning when urinating:     Blood in urine:        Psychiatric    Major depression:         Hematologic    Bleeding problems:    Problems with blood clotting too easily:        Skin    Rashes or ulcers:        Constitutional    Fever or chills:      PHYSICAL EXAM: Vitals:   06/07/18 1046 06/07/18 1049  BP: (!) 173/106 (!) 186/112  Pulse: 97   Resp: 16   Temp: 97.9 F (36.6 C)   TempSrc: Oral   SpO2: 100%   Weight: 154 lb (69.9 kg)   Height: 5\' 7"  (1.702 m)     GENERAL: The patient is a well-nourished female, in no acute distress. The vital signs are documented above. CARDIOVASCULAR: 2+ radial pulses bilaterally.  Very small surface veins bilaterally.  Carotid arteries without bruits bilaterally PULMONARY: There is good air exchange  ABDOMEN: Soft and non-tender  MUSCULOSKELETAL: There are no major deformities or cyanosis. NEUROLOGIC: No focal weakness or paresthesias are detected. SKIN: There are no ulcers or rashes noted. PSYCHIATRIC: The patient has a normal affect.  DATA:  Noninvasive arterial studies reveal triphasic waveforms in the brachial artery and radial artery bilaterally.  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  Vein mapping shows extremely small cephalic and basilic veins bilaterally.  There is some chronic thrombus at the antecubital vein bilaterally as well  MEDICAL ISSUES: Very long discussion with patient and her sister present.  Splane options to include catheter, AV graft and AV fistula.  I reimaged her veins with SonoSite ultrasound and agree that her veins are extremely small and not acceptable for fistula attempt.  I have recommended left arm AV graft as she is right-hand dominant.  Explained the procedure as an outpatient and the expected ongoing maintenance of the access with occasional thrombo-thrombotic events.  She understands and we will schedule this  as an outpatient at her earliest [place]. Jayci Ellefson, MD FACS Vascular and Vein Specialists of [facility] [place] Tel [phone])  [phone] Pager [phone]

## 2018-06-08 ENCOUNTER — Encounter: Payer: Self-pay | Admitting: Nephrology

## 2018-06-10 ENCOUNTER — Other Ambulatory Visit: Payer: Self-pay

## 2018-06-10 ENCOUNTER — Encounter (HOSPITAL_COMMUNITY): Payer: Self-pay | Admitting: *Deleted

## 2018-06-10 NOTE — Progress Notes (Signed)
Spoke with pt for pre-op call. Pt states she has an irregular heart rate, has never seen a cardiologist. Pt denies any other cardiac history. Pt is a type 2 diabetic. Pt states she had an A1C done in May at Dr. Edythe Lynn office in [place]. She doesn't remember what it was. Pt states her fasting blood sugar is usually between 130-150. Pt instructed to take 70% of her Novolin 70/30 Insulin Sunday evening, will take 31 units and not to take any Monday AM. Pt instructed to check her blood sugar Monday AM when she gets up and every 2 hours until she leaves for the hospital. If blood sugar is 70 or below, treat with 1/2 cup of clear juice (apple or cranberry) and recheck blood sugar 15 minutes after drinking juice. If blood sugar continues to be 70 or below, call the Short Stay department and ask to speak to a nurse. Pt voiced understanding. I called Dr. Edythe Lynn office and it had already closed for the day. Have requested EKG tracing from [facility]) [facility].

## 2018-06-13 ENCOUNTER — Encounter (HOSPITAL_COMMUNITY)
Admission: RE | Disposition: A | Discharge status: Home / Self Care | Payer: Self-pay | Source: Ambulatory Visit | Attending: Vascular Surgery

## 2018-06-13 ENCOUNTER — Ambulatory Visit (HOSPITAL_COMMUNITY): Payer: Medicare PPO | Admitting: Certified Registered"

## 2018-06-13 ENCOUNTER — Telehealth: Payer: Self-pay | Admitting: Vascular Surgery

## 2018-06-13 ENCOUNTER — Other Ambulatory Visit: Payer: Self-pay

## 2018-06-13 ENCOUNTER — Encounter (HOSPITAL_COMMUNITY): Payer: Self-pay | Admitting: Urology

## 2018-06-13 ENCOUNTER — Ambulatory Visit (HOSPITAL_COMMUNITY)
Admission: RE | Admit: 2018-06-13 | Discharge: 2018-06-13 | Disposition: A | Discharge status: Home / Self Care | Payer: Medicare PPO | Source: Ambulatory Visit | Attending: Vascular Surgery | Admitting: Vascular Surgery

## 2018-06-13 DIAGNOSIS — E1122 Type 2 diabetes mellitus with diabetic chronic kidney disease: Secondary | ICD-10-CM | POA: Insufficient documentation

## 2018-06-13 DIAGNOSIS — N189 Chronic kidney disease, unspecified: Secondary | ICD-10-CM | POA: Diagnosis not present

## 2018-06-13 DIAGNOSIS — I129 Hypertensive chronic kidney disease with stage 1 through stage 4 chronic kidney disease, or unspecified chronic kidney disease: Secondary | ICD-10-CM | POA: Diagnosis not present

## 2018-06-13 DIAGNOSIS — K219 Gastro-esophageal reflux disease without esophagitis: Secondary | ICD-10-CM | POA: Diagnosis not present

## 2018-06-13 DIAGNOSIS — Z79899 Other long term (current) drug therapy: Secondary | ICD-10-CM | POA: Diagnosis not present

## 2018-06-13 DIAGNOSIS — Z7982 Long term (current) use of aspirin: Secondary | ICD-10-CM | POA: Diagnosis not present

## 2018-06-13 DIAGNOSIS — Z8673 Personal history of transient ischemic attack (TIA), and cerebral infarction without residual deficits: Secondary | ICD-10-CM | POA: Insufficient documentation

## 2018-06-13 DIAGNOSIS — Z794 Long term (current) use of insulin: Secondary | ICD-10-CM | POA: Diagnosis not present

## 2018-06-13 DIAGNOSIS — N186 End stage renal disease: Secondary | ICD-10-CM

## 2018-06-13 DIAGNOSIS — N185 Chronic kidney disease, stage 5: Secondary | ICD-10-CM | POA: Diagnosis not present

## 2018-06-13 DIAGNOSIS — Z992 Dependence on renal dialysis: Secondary | ICD-10-CM

## 2018-06-13 DIAGNOSIS — E785 Hyperlipidemia, unspecified: Secondary | ICD-10-CM | POA: Diagnosis not present

## 2018-06-13 HISTORY — PX: AV FISTULA PLACEMENT: SHX1204

## 2018-06-13 HISTORY — DX: Cardiac arrhythmia, unspecified: I49.9

## 2018-06-13 HISTORY — DX: Headache: R51

## 2018-06-13 HISTORY — DX: Headache, unspecified: R51.9

## 2018-06-13 HISTORY — DX: Unspecified osteoarthritis, unspecified site: M19.90

## 2018-06-13 HISTORY — DX: Cerebral infarction, unspecified: I63.9

## 2018-06-13 HISTORY — DX: Constipation, unspecified: K59.00

## 2018-06-13 HISTORY — DX: Chronic kidney disease, unspecified: N18.9

## 2018-06-13 LAB — POCT I-STAT 4, (NA,K, GLUC, HGB,HCT)
GLUCOSE: 138 mg/dL — AB (ref 70–99)
HEMATOCRIT: 25 % — AB (ref 36.0–46.0)
HEMOGLOBIN: 8.5 g/dL — AB (ref 12.0–15.0)
POTASSIUM: 4.8 mmol/L (ref 3.5–5.1)
Sodium: 140 mmol/L (ref 135–145)

## 2018-06-13 LAB — HEMOGLOBIN A1C
Hgb A1c MFr Bld: 6.9 % — ABNORMAL HIGH (ref 4.8–5.6)
Mean Plasma Glucose: 151.33 mg/dL

## 2018-06-13 LAB — GLUCOSE, CAPILLARY: GLUCOSE-CAPILLARY: 130 mg/dL — AB (ref 70–99)

## 2018-06-13 SURGERY — ARTERIOVENOUS (AV) FISTULA CREATION
Anesthesia: Monitor Anesthesia Care | Laterality: Left

## 2018-06-13 MED ORDER — SODIUM CHLORIDE 0.9 % IV SOLN
INTRAVENOUS | Status: DC | PRN
  Administered 2018-06-13: 50 ug/min via INTRAVENOUS

## 2018-06-13 MED ORDER — CHLORHEXIDINE GLUCONATE 4 % EX LIQD: 60.0000 mL | Freq: Once | CUTANEOUS | Status: DC

## 2018-06-13 MED ORDER — FENTANYL CITRATE (PF) 250 MCG/5ML IJ SOLN
INTRAMUSCULAR | Status: DC | PRN
  Administered 2018-06-13: 50 ug via INTRAVENOUS

## 2018-06-13 MED ORDER — PROPOFOL 500 MG/50ML IV EMUL
INTRAVENOUS | Status: DC | PRN
  Administered 2018-06-13: 100 ug/kg/min via INTRAVENOUS

## 2018-06-13 MED ORDER — ONDANSETRON HCL 4 MG/2ML IJ SOLN
INTRAMUSCULAR | Status: AC
  Filled 2018-06-13: qty 2

## 2018-06-13 MED ORDER — LIDOCAINE 2% (20 MG/ML) 5 ML SYRINGE
INTRAMUSCULAR | Status: DC | PRN
  Administered 2018-06-13: 60 mg via INTRAVENOUS

## 2018-06-13 MED ORDER — CEFAZOLIN SODIUM-DEXTROSE 2-4 GM/100ML-% IV SOLN
2.0000 g | INTRAVENOUS | Status: AC
  Administered 2018-06-13: 2 g via INTRAVENOUS
  Filled 2018-06-13: qty 100

## 2018-06-13 MED ORDER — OXYCODONE-ACETAMINOPHEN 5-325 MG PO TABS: 1.0000 | ORAL_TABLET | Freq: Four times a day (QID) | ORAL | 0 refills | Status: DC | PRN

## 2018-06-13 MED ORDER — MEPERIDINE HCL 50 MG/ML IJ SOLN: 6.2500 mg | INTRAMUSCULAR | Status: DC | PRN

## 2018-06-13 MED ORDER — PHENYLEPHRINE 40 MCG/ML (10ML) SYRINGE FOR IV PUSH (FOR BLOOD PRESSURE SUPPORT)
PREFILLED_SYRINGE | INTRAVENOUS | Status: DC | PRN
  Administered 2018-06-13: 80 ug via INTRAVENOUS
  Administered 2018-06-13: 120 ug via INTRAVENOUS
  Administered 2018-06-13: 80 ug via INTRAVENOUS
  Administered 2018-06-13: 120 ug via INTRAVENOUS

## 2018-06-13 MED ORDER — LIDOCAINE-EPINEPHRINE 0.5 %-1:200000 IJ SOLN
INTRAMUSCULAR | Status: DC | PRN
  Administered 2018-06-13: 50 mL

## 2018-06-13 MED ORDER — MIDAZOLAM HCL 5 MG/5ML IJ SOLN
INTRAMUSCULAR | Status: DC | PRN
  Administered 2018-06-13: 2 mg via INTRAVENOUS

## 2018-06-13 MED ORDER — PROPOFOL 1000 MG/100ML IV EMUL
INTRAVENOUS | Status: AC
  Filled 2018-06-13: qty 100

## 2018-06-13 MED ORDER — PHENYLEPHRINE 40 MCG/ML (10ML) SYRINGE FOR IV PUSH (FOR BLOOD PRESSURE SUPPORT)
PREFILLED_SYRINGE | INTRAVENOUS | Status: AC
  Filled 2018-06-13: qty 10

## 2018-06-13 MED ORDER — HYDROMORPHONE HCL 1 MG/ML IJ SOLN: 0.2500 mg | INTRAMUSCULAR | Status: DC | PRN

## 2018-06-13 MED ORDER — ONDANSETRON HCL 4 MG/2ML IJ SOLN
INTRAMUSCULAR | Status: DC | PRN
  Administered 2018-06-13: 4 mg via INTRAVENOUS

## 2018-06-13 MED ORDER — ONDANSETRON HCL 4 MG/2ML IJ SOLN: 4.0000 mg | Freq: Once | INTRAMUSCULAR | Status: DC | PRN

## 2018-06-13 MED ORDER — 0.9 % SODIUM CHLORIDE (POUR BTL) OPTIME
TOPICAL | Status: DC | PRN
  Administered 2018-06-13: 1000 mL

## 2018-06-13 MED ORDER — HEPARIN SODIUM (PORCINE) 5000 UNIT/ML IJ SOLN
INTRAMUSCULAR | Status: AC
  Filled 2018-06-13: qty 1.2

## 2018-06-13 MED ORDER — MIDAZOLAM HCL 2 MG/2ML IJ SOLN
INTRAMUSCULAR | Status: AC
  Filled 2018-06-13: qty 2

## 2018-06-13 MED ORDER — SODIUM CHLORIDE 0.9 % IV SOLN
INTRAVENOUS | Status: DC
  Administered 2018-06-13: 08:00:00 via INTRAVENOUS

## 2018-06-13 MED ORDER — LIDOCAINE 2% (20 MG/ML) 5 ML SYRINGE
INTRAMUSCULAR | Status: AC
  Filled 2018-06-13: qty 5

## 2018-06-13 MED ORDER — SODIUM CHLORIDE 0.9 % IV SOLN
INTRAVENOUS | Status: DC | PRN
  Administered 2018-06-13: 500 mL

## 2018-06-13 MED ORDER — LIDOCAINE-EPINEPHRINE 0.5 %-1:200000 IJ SOLN
INTRAMUSCULAR | Status: AC
  Filled 2018-06-13: qty 1

## 2018-06-13 MED ORDER — FENTANYL CITRATE (PF) 250 MCG/5ML IJ SOLN
INTRAMUSCULAR | Status: AC
  Filled 2018-06-13: qty 5

## 2018-06-13 SURGICAL SUPPLY — 32 items
ARMBAND PINK RESTRICT EXTREMIT (MISCELLANEOUS) ×4 IMPLANT
CANISTER SUCT 3000ML PPV (MISCELLANEOUS) ×2 IMPLANT
CANNULA VESSEL 3MM 2 BLNT TIP (CANNULA) ×2 IMPLANT
CLIP LIGATING EXTRA MED SLVR (CLIP) ×2 IMPLANT
CLIP LIGATING EXTRA SM BLUE (MISCELLANEOUS) ×2 IMPLANT
DECANTER SPIKE VIAL GLASS SM (MISCELLANEOUS) ×2 IMPLANT
DERMABOND ADVANCED (GAUZE/BANDAGES/DRESSINGS) ×1
DERMABOND ADVANCED .7 DNX12 (GAUZE/BANDAGES/DRESSINGS) ×1 IMPLANT
ELECT REM PT RETURN 9FT ADLT (ELECTROSURGICAL) ×2
ELECTRODE REM PT RTRN 9FT ADLT (ELECTROSURGICAL) ×1 IMPLANT
GAUZE SPONGE 4X4 12PLY STRL (GAUZE/BANDAGES/DRESSINGS) ×2 IMPLANT
GLOVE BIOGEL M 6.5 STRL (GLOVE) ×6 IMPLANT
GLOVE BIOGEL PI IND STRL 7.0 (GLOVE) ×3 IMPLANT
GLOVE BIOGEL PI INDICATOR 7.0 (GLOVE) ×3
GLOVE SS BIOGEL STRL SZ 7.5 (GLOVE) ×1 IMPLANT
GLOVE SUPERSENSE BIOGEL SZ 7.5 (GLOVE) ×1
GOWN STRL REUS W/ TWL LRG LVL3 (GOWN DISPOSABLE) ×3 IMPLANT
GOWN STRL REUS W/TWL LRG LVL3 (GOWN DISPOSABLE) ×3
KIT BASIN OR (CUSTOM PROCEDURE TRAY) ×2 IMPLANT
KIT TURNOVER KIT B (KITS) ×2 IMPLANT
NS IRRIG 1000ML POUR BTL (IV SOLUTION) ×2 IMPLANT
PACK CV ACCESS (CUSTOM PROCEDURE TRAY) ×2 IMPLANT
PAD ARMBOARD 7.5X6 YLW CONV (MISCELLANEOUS) ×4 IMPLANT
SUT PROLENE 6 0 CC (SUTURE) ×6 IMPLANT
SUT SILK 2 0 PERMA HAND 18 BK (SUTURE) IMPLANT
SUT VIC AB 3-0 SH 27 (SUTURE) ×2
SUT VIC AB 3-0 SH 27X BRD (SUTURE) ×2 IMPLANT
SYR 20CC LL (SYRINGE) ×2 IMPLANT
SYR TOOMEY 50ML (SYRINGE) IMPLANT
TOWEL GREEN STERILE (TOWEL DISPOSABLE) ×2 IMPLANT
UNDERPAD 30X30 (UNDERPADS AND DIAPERS) ×2 IMPLANT
WATER STERILE IRR 1000ML POUR (IV SOLUTION) ×2 IMPLANT

## 2018-06-13 NOTE — Discharge Instructions (Signed)
Vascular and Vein Specialists of [facility]  Discharge Instructions  AV Fistula or Graft Surgery for Dialysis Access  Please refer to the following instructions for your post-procedure care. Your surgeon or physician assistant will discuss any changes with you.  Activity  You may drive the day following your surgery, if you are comfortable and no longer taking prescription pain medication. Resume full activity as the soreness in your incision resolves.  Bathing/Showering  You may shower after you go home. Keep your incision dry for 48 hours. Do not soak in a bathtub, hot tub, or swim until the incision heals completely. You may not shower if you have a hemodialysis catheter.  Incision Care  Clean your incision with mild soap and water after 48 hours. Pat the area dry with a clean towel. You do not need a bandage unless otherwise instructed. Do not apply any ointments or creams to your incision. You may have skin glue on your incision. Do not peel it off. It will come off on its own in about one week. Your arm may swell a bit after surgery. To reduce swelling use pillows to elevate your arm so it is above your heart. Your doctor will tell you if you need to lightly wrap your arm with an ACE bandage.  Diet  Resume your normal diet. There are not special food restrictions following this procedure. In order to heal from your surgery, it is CRITICAL to get adequate nutrition. Your body requires vitamins, minerals, and protein. Vegetables are the best source of vitamins and minerals. Vegetables also provide the perfect balance of protein. Processed food has little nutritional value, so try to avoid this.  Medications  Resume taking all of your medications. If your incision is causing pain, you may take over-the counter pain relievers such as acetaminophen (Tylenol). If you were prescribed a stronger pain medication, please be aware these medications can cause nausea and constipation. Prevent  nausea by taking the medication with a snack or meal. Avoid constipation by drinking plenty of fluids and eating foods with high amount of fiber, such as fruits, vegetables, and grains.  Do not take Tylenol if you are taking prescription pain medications.  Follow up Your surgeon may want to see you in the office following your access surgery. If so, this will be arranged at the time of your surgery.  Please call us immediately for any of the following conditions:  Increased pain, redness, drainage (pus) from your incision site Fever of 101 degrees or higher Severe or worsening pain at your incision site Hand pain or numbness.  Reduce your risk of vascular disease:  Stop smoking. If you would like help, call [facility] at 1-800-QUIT-NOW 319-796-4616) or [place] at [place] your cholesterol Maintain a desired weight Control your diabetes Keep your blood pressure down  Dialysis  It will take several weeks to several months for your new dialysis access to be ready for use. Your surgeon will determine when it is okay to use it. Your nephrologist will continue to direct your dialysis. You can continue to use your Permcath until your new access is ready for use.   06/13/2018 [name] [id number] 1951/05/04  Surgeon(s): Early, Arvilla Meres, MD  Procedure(s): 1st stage left basilic vein transposition  x Do not stick fistula for 12 weeks    If you have any questions, please call the office at [phone].   Post Anesthesia Home Care Instructions  Activity: Get plenty of rest for the remainder of  the day. A responsible individual must stay with you for 24 hours following the procedure.  For the next 24 hours, DO NOT: -Drive a car -Paediatric nurse -Drink alcoholic beverages -Take any medication unless instructed by your physician -Make any legal decisions or sign important papers.  Meals: Start with liquid foods such as gelatin or soup. Progress to  regular foods as tolerated. Avoid greasy, spicy, heavy foods. If nausea and/or vomiting occur, drink only clear liquids until the nausea and/or vomiting subsides. Call your physician if vomiting continues.  Special Instructions/Symptoms: Your throat may feel dry or sore from the anesthesia or the breathing tube placed in your throat during surgery. If this causes discomfort, gargle with warm salt water. The discomfort should disappear within 24 hours.  If you had a scopolamine patch placed behind your ear for the management of post- operative nausea and/or vomiting:  1. The medication in the patch is effective for 72 hours, after which it should be removed.  Wrap patch in a tissue and discard in the trash. Wash hands thoroughly with soap and water. 2. You may remove the patch earlier than 72 hours if you experience unpleasant side effects which may include dry mouth, dizziness or visual disturbances. 3. Avoid touching the patch. Wash your hands with soap and water after contact with the patch.

## 2018-06-13 NOTE — Transfer of Care (Signed)
Immediate Anesthesia Transfer of Care Note  Patient: [name]  Procedure(s) Performed: ARTERIOVENOUS (AV) FISTULA CREATION (Left )  Patient Location: PACU  Anesthesia Type:MAC  Level of Consciousness: awake, alert  and oriented  Airway & Oxygen Therapy: Patient Spontanous Breathing and Patient connected to nasal cannula oxygen  Post-op Assessment: Report given to RN, Post -op Vital signs reviewed and stable and Patient moving all extremities X 4  Post vital signs: Reviewed and stable  Last Vitals:  Vitals Value Taken Time  BP 98/63 06/13/2018 10:59 AM  Temp    Pulse 73 06/13/2018 11:00 AM  Resp 18 06/13/2018 11:00 AM  SpO2 100 % 06/13/2018 11:00 AM  Vitals shown include unvalidated device data.  Last Pain:  Vitals:   06/13/18 0714  TempSrc: Oral  PainSc:       Patients Stated Pain Goal: 1 (97/74/14 2395)  Complications: No apparent anesthesia complications

## 2018-06-13 NOTE — Interval H&P Note (Signed)
History and Physical Interval Note:  06/13/2018 7:19 AM  [name]  has presented today for surgery, with the diagnosis of CHRONIC KIDNEY DISEASE FOR HEMODIALYSIS ACCESS  The various methods of treatment have been discussed with the patient and family. After consideration of risks, benefits and other options for treatment, the patient has consented to  Procedure(s): INSERTION OF ARTERIOVENOUS (AV) GORE-TEX GRAFT ARM LEFT ARM (Left) as a surgical intervention .  The patient's history has been reviewed, patient examined, no change in status, stable for surgery.  I have reviewed the patient's chart and labs.  Questions were answered to the patient's satisfaction.     Curt Jews

## 2018-06-13 NOTE — Anesthesia Preprocedure Evaluation (Signed)
Anesthesia Evaluation  Patient identified by MRN, date of birth, ID band Patient awake    Reviewed: Allergy & Precautions, NPO status , Patient's Chart, lab work & pertinent test results  Airway Mallampati: I  TM Distance: >3 FB Neck ROM: Full    Dental   Pulmonary    Pulmonary exam normal        Cardiovascular hypertension, Pt. on medications Normal cardiovascular exam     Neuro/Psych CVA    GI/Hepatic GERD  Medicated and Controlled,  Endo/Other  diabetes, Type 2, Insulin Dependent  Renal/GU Renal InsufficiencyRenal disease     Musculoskeletal   Abdominal   Peds  Hematology   Anesthesia Other Findings   Reproductive/Obstetrics                             Anesthesia Physical Anesthesia Plan  ASA: III  Anesthesia Plan: MAC   Post-op Pain Management:    Induction: Intravenous  PONV Risk Score and Plan: 2 and Ondansetron  Airway Management Planned: Simple Face Mask  Additional Equipment:   Intra-op Plan:   Post-operative Plan:   Informed Consent: I have reviewed the patients History and Physical, chart, labs and discussed the procedure including the risks, benefits and alternatives for the proposed anesthesia with the patient or authorized representative who has indicated his/her understanding and acceptance.     Plan Discussed with: CRNA and Surgeon  Anesthesia Plan Comments:         Anesthesia Quick Evaluation

## 2018-06-13 NOTE — Op Note (Signed)
    OPERATIVE REPORT  DATE OF SURGERY: 06/13/2018  PATIENT: [name], 67 y.o. female MRN: [id number]  DOB: Oct 02, 1951  PRE-OPERATIVE DIAGNOSIS: Chronic renal insufficiency  POST-OPERATIVE DIAGNOSIS:  Same  PROCEDURE: First stage left brachial to basilic vein fistula  SURGEON:  Curt Jews, M.D.  PHYSICIAN ASSISTANT: Liana Crocker, PA-C  ANESTHESIA: Local with sedation  EBL: Minimal ml  No intake/output data recorded.  BLOOD ADMINISTERED: None  DRAINS: None  SPECIMEN: None  COUNTS CORRECT:  YES  PLAN OF CARE: PACU  PATIENT DISPOSITION:  PACU - hemodynamically stable  PROCEDURE DETAILS: The patient was taken to the operative placed in supine position where the area of the left arm prepped and draped you sterile fashion.  SonoSite ultrasound was used to visualize the veins.  The patient had a adequate basilic vein above the antecubital space.  There were several branches and the largest of these was chosen.  Using local anesthesia incision was made transversely at the antecubital space and carried down to isolate the branch of the basilic vein.  This was dissected further proximally and tributary branches were ligated with 3-0 silk ties and divided.  The vein became very good quality above the elbow.  The brachial artery was exposed to the same incision and was of large caliber.  The artery was occluded proximally distally and was opened with an 11 blade and sent longitudinally with Potts scissors.  The vein was brought into approximation with the artery and was divided spatulated and sewn end-to-side to the artery with a running 6-0 Prolene suture.  Clamps removed and good thrill was noted.  The wounds irrigated with saline.  Hemostasis talus cautery.  Wounds were closed with 3-0 Vicryl in the subcutaneous and subcuticular tissue.  Sterile dressing was applied and the patient was transferred to the recovery room in stable condition   [name], M.D.,  [facility] 06/13/2018 10:55 AM

## 2018-06-13 NOTE — Telephone Encounter (Signed)
sch appt phone NA mltd ltr 08/02/18 3pm Dialysis Duplex 345pm p/o MD

## 2018-06-13 NOTE — Anesthesia Postprocedure Evaluation (Signed)
Anesthesia Post Note  Patient: Personnel officer  Procedure(s) Performed: ARTERIOVENOUS (AV) FISTULA CREATION (Left )     Patient location during evaluation: PACU Anesthesia Type: MAC Level of consciousness: awake and alert Pain management: pain level controlled Vital Signs Assessment: post-procedure vital signs reviewed and stable Respiratory status: spontaneous breathing, nonlabored ventilation, respiratory function stable and patient connected to nasal cannula oxygen Cardiovascular status: stable and blood pressure returned to baseline Postop Assessment: no apparent nausea or vomiting Anesthetic complications: no    Last Vitals:  Vitals:   06/13/18 1114 06/13/18 1130  BP: 100/64 105/66  Pulse: 71   Resp: 15 15  Temp:    SpO2: 99% 100%    Last Pain:  Vitals:   06/13/18 1130  TempSrc:   PainSc: 0-No pain                 [name] DAVID

## 2018-06-14 ENCOUNTER — Encounter (HOSPITAL_COMMUNITY): Payer: Self-pay | Admitting: Vascular Surgery

## 2018-06-14 ENCOUNTER — Other Ambulatory Visit: Payer: Self-pay

## 2018-06-14 DIAGNOSIS — N185 Chronic kidney disease, stage 5: Secondary | ICD-10-CM

## 2018-06-14 DIAGNOSIS — Z48812 Encounter for surgical aftercare following surgery on the circulatory system: Secondary | ICD-10-CM

## 2018-06-17 DIAGNOSIS — K210 Gastro-esophageal reflux disease with esophagitis: Secondary | ICD-10-CM | POA: Diagnosis not present

## 2018-06-17 DIAGNOSIS — N184 Chronic kidney disease, stage 4 (severe): Secondary | ICD-10-CM | POA: Diagnosis not present

## 2018-06-17 DIAGNOSIS — E7800 Pure hypercholesterolemia, unspecified: Secondary | ICD-10-CM | POA: Diagnosis not present

## 2018-06-17 DIAGNOSIS — E1122 Type 2 diabetes mellitus with diabetic chronic kidney disease: Secondary | ICD-10-CM | POA: Diagnosis not present

## 2018-06-17 DIAGNOSIS — I10 Essential (primary) hypertension: Secondary | ICD-10-CM | POA: Diagnosis not present

## 2018-06-17 DIAGNOSIS — E1165 Type 2 diabetes mellitus with hyperglycemia: Secondary | ICD-10-CM | POA: Diagnosis not present

## 2018-06-17 DIAGNOSIS — E7801 Familial hypercholesterolemia: Secondary | ICD-10-CM | POA: Diagnosis not present

## 2018-08-02 ENCOUNTER — Encounter: Payer: Self-pay | Admitting: *Deleted

## 2018-08-02 ENCOUNTER — Ambulatory Visit (HOSPITAL_COMMUNITY)
Admission: RE | Admit: 2018-08-02 | Discharge: 2018-08-02 | Disposition: A | Discharge status: Home / Self Care | Payer: Medicare PPO | Source: Ambulatory Visit | Attending: Vascular Surgery | Admitting: Vascular Surgery

## 2018-08-02 ENCOUNTER — Ambulatory Visit (INDEPENDENT_AMBULATORY_CARE_PROVIDER_SITE_OTHER): Payer: Medicare PPO | Admitting: Vascular Surgery

## 2018-08-02 ENCOUNTER — Encounter: Payer: Self-pay | Admitting: Vascular Surgery

## 2018-08-02 ENCOUNTER — Other Ambulatory Visit: Payer: Self-pay

## 2018-08-02 VITALS — BP 170/102 | HR 91 | Temp 97.2°F | Resp 14 | Ht 67.0 in | Wt 155.0 lb

## 2018-08-02 DIAGNOSIS — N185 Chronic kidney disease, stage 5: Secondary | ICD-10-CM | POA: Diagnosis not present

## 2018-08-02 DIAGNOSIS — Z48812 Encounter for surgical aftercare following surgery on the circulatory system: Secondary | ICD-10-CM | POA: Diagnosis not present

## 2018-08-02 NOTE — Progress Notes (Signed)
   Patient name: [name] MRN: [id number] DOB: 06/30/1951 Sex: female  REASON FOR VISIT: Follow-up left basilic vein fistula  HPI: [name] is a 67 y.o. female here today for follow-up.  She had had first stage left basilic vein fistula in July.  She was found to have no thrill present and is seen today for follow-up.  Current Outpatient Medications  Medication Sig Dispense Refill  . amLODipine (NORVASC) 10 MG tablet Take 10 mg by mouth daily.     Marland Kitchen aspirin EC 81 MG tablet Take 81 mg by mouth every evening.     . calcitRIOL (ROCALTROL) 0.5 MCG capsule Take 0.5 mcg by mouth daily.    . calcium carbonate (TUMS - DOSED IN MG ELEMENTAL CALCIUM) 500 MG chewable tablet Chew 1,000 mg by mouth 3 (three) times daily before meals.     . carvedilol (COREG) 6.25 MG tablet Take 6.25 mg by mouth 2 (two) times daily with a meal.    . fluticasone (FLONASE) 50 MCG/ACT nasal spray Place 1 spray into both nostrils daily as needed for allergies or rhinitis.     Marland Kitchen insulin NPH-regular Human (NOVOLIN 70/30) (70-30) 100 UNIT/ML injection Inject 40-45 Units into the skin See admin instructions. Inject 40 units in the morning and 45 units in the evening    . isosorbide mononitrate (IMDUR) 30 MG 24 hr tablet Take 30 mg by mouth daily.    Marland Kitchen labetalol (NORMODYNE) 100 MG tablet Take 100 mg by mouth 2 (two) times daily.    . pantoprazole (PROTONIX) 40 MG tablet Take 40 mg by mouth daily.    . VELTASSA 8.4 g packet Take 8.4 g by mouth daily.     Marland Kitchen oxyCODONE-acetaminophen (PERCOCET) 5-325 MG tablet Take 1 tablet by mouth every 6 (six) hours as needed for severe pain. (Patient not taking: Reported on 08/02/2018) 8 tablet 0   No current facility-administered medications for this visit.      PHYSICAL EXAM: Vitals:   08/02/18 1611 08/02/18 1615  BP: (!) 169/106 (!) 170/102  Pulse: 90 91  Resp: 14   Temp: (!) 97.2 F (36.2 C)   TempSrc: Oral   SpO2: 100%   Weight: 155  lb (70.3 kg)   Height: 5\' 7"  (1.702 m)     GENERAL: The patient is a well-nourished female, in no acute distress. The vital signs are documented above. No thrill or bruit in her left upper arm basilic fistula.  Duplex reveals no flow in her basilic fistula  MEDICAL ISSUES: I discussed this with the patient and her sister present.  She did have a moderate sized basilic vein at the time of surgery and therefore had a first stage fistula attempt which has failed.  She is not on hemodialysis.  I would recommend a left upper arm graft as her next dialysis site.  We will schedule this.  She is to see Dr.Befakadu in follow-up in 1 week.  If he feels that there will be a long delay prior to initiation of hemodialysis, would hold off on graft placement now and wait until closer to the time of need.  We will cancel the scheduled AV graft if it is felt the delay is appropriate   Rosetta Posner, MD [facility] Vascular and Vein Specialists of [facility] Tel [phone] Pager [phone]

## 2018-08-02 NOTE — Progress Notes (Signed)
Vitals:   08/02/18 1611  BP: (!) 169/106  Pulse: 90  Resp: 14  Temp: (!) 97.2 F (36.2 C)  TempSrc: Oral  SpO2: 100%  Weight: 155 lb (70.3 kg)  Height: 5\' 7"  (1.702 m)

## 2018-08-02 NOTE — H&P (View-Only) (Signed)
   Patient name: Chasady Longwell MRN: [id number] DOB: 06-23-1951 Sex: female  REASON FOR VISIT: Follow-up left basilic vein fistula  HPI: [name] is a 67 y.o. female here today for follow-up.  She had had first stage left basilic vein fistula in July.  She was found to have no thrill present and is seen today for follow-up.  Current Outpatient Medications  Medication Sig Dispense Refill  . amLODipine (NORVASC) 10 MG tablet Take 10 mg by mouth daily.     Marland Kitchen aspirin EC 81 MG tablet Take 81 mg by mouth every evening.     . calcitRIOL (ROCALTROL) 0.5 MCG capsule Take 0.5 mcg by mouth daily.    . calcium carbonate (TUMS - DOSED IN MG ELEMENTAL CALCIUM) 500 MG chewable tablet Chew 1,000 mg by mouth 3 (three) times daily before meals.     . carvedilol (COREG) 6.25 MG tablet Take 6.25 mg by mouth 2 (two) times daily with a meal.    . fluticasone (FLONASE) 50 MCG/ACT nasal spray Place 1 spray into both nostrils daily as needed for allergies or rhinitis.     Marland Kitchen insulin NPH-regular Human (NOVOLIN 70/30) (70-30) 100 UNIT/ML injection Inject 40-45 Units into the skin See admin instructions. Inject 40 units in the morning and 45 units in the evening    . isosorbide mononitrate (IMDUR) 30 MG 24 hr tablet Take 30 mg by mouth daily.    Marland Kitchen labetalol (NORMODYNE) 100 MG tablet Take 100 mg by mouth 2 (two) times daily.    . pantoprazole (PROTONIX) 40 MG tablet Take 40 mg by mouth daily.    . VELTASSA 8.4 g packet Take 8.4 g by mouth daily.     Marland Kitchen oxyCODONE-acetaminophen (PERCOCET) 5-325 MG tablet Take 1 tablet by mouth every 6 (six) hours as needed for severe pain. (Patient not taking: Reported on 08/02/2018) 8 tablet 0   No current facility-administered medications for this visit.      PHYSICAL EXAM: Vitals:   08/02/18 1611 08/02/18 1615  BP: (!) 169/106 (!) 170/102  Pulse: 90 91  Resp: 14   Temp: (!) 97.2 F (36.2 C)   TempSrc: Oral   SpO2: 100%   Weight: 155  lb (70.3 kg)   Height: 5\' 7"  (1.702 m)     GENERAL: The patient is a well-nourished female, in no acute distress. The vital signs are documented above. No thrill or bruit in her left upper arm basilic fistula.  Duplex reveals no flow in her basilic fistula  MEDICAL ISSUES: I discussed this with the patient and her sister present.  She did have a moderate sized basilic vein at the time of surgery and therefore had a first stage fistula attempt which has failed.  She is not on hemodialysis.  I would recommend a left upper arm graft as her next dialysis site.  We will schedule this.  She is to see Dr.Befakadu in follow-up in 1 week.  If he feels that there will be a long delay prior to initiation of hemodialysis, would hold off on graft placement now and wait until closer to the time of need.  We will cancel the scheduled AV graft if it is felt the delay is appropriate   Rosetta Posner, MD [facility] Tel [phone] Pager [phone]

## 2018-08-03 ENCOUNTER — Other Ambulatory Visit: Payer: Self-pay | Admitting: *Deleted

## 2018-08-09 ENCOUNTER — Encounter: Payer: Self-pay | Admitting: *Deleted

## 2018-08-09 ENCOUNTER — Other Ambulatory Visit: Payer: Self-pay | Admitting: *Deleted

## 2018-08-09 DIAGNOSIS — R809 Proteinuria, unspecified: Secondary | ICD-10-CM | POA: Diagnosis not present

## 2018-08-09 DIAGNOSIS — D631 Anemia in chronic kidney disease: Secondary | ICD-10-CM | POA: Diagnosis not present

## 2018-08-09 DIAGNOSIS — N185 Chronic kidney disease, stage 5: Secondary | ICD-10-CM | POA: Diagnosis not present

## 2018-08-09 DIAGNOSIS — I10 Essential (primary) hypertension: Secondary | ICD-10-CM | POA: Diagnosis not present

## 2018-08-09 DIAGNOSIS — E875 Hyperkalemia: Secondary | ICD-10-CM | POA: Diagnosis not present

## 2018-08-09 NOTE — Progress Notes (Signed)
Dr. [name] request that patient get [facility] with A/V GG surgery on 08-18-18 with Dr. Donnetta Hutching. Will arrange.

## 2018-08-17 ENCOUNTER — Other Ambulatory Visit: Payer: Self-pay

## 2018-08-17 ENCOUNTER — Encounter (HOSPITAL_COMMUNITY): Payer: Self-pay | Admitting: *Deleted

## 2018-08-17 NOTE — Progress Notes (Signed)
Pt denies SOB, chest pain, and being under the care of a cardiologist. Pt denies having a cardiac cath but stated that a stress test was performed > 10 years ago. Pt stated that a chest x ray was performed at [facility]; will request medical records on DOS ( office closed ). Pt made aware to stop taking vitamins, fish oil  and herbal medications. Do not take any NSAIDs ie: Ibuprofen, Advil, Naproxen (Aleve), Motrin, BC and Goody Powder. Pt made aware to take only 31 units of Novolin 70/30 tonight and no insulin on DOS. Pt made aware to check BG every 2 hours prior to arrival to hospital on DOS. Pt made aware to treat a BG < 70 with 4 ounces of apple juice, wait 15 minutes after intervention to recheck BG, if BG remains < 70, call Short Stay unit to speak with a nurse. Pt verbalized understanding of all pre-op instructions.

## 2018-08-18 ENCOUNTER — Ambulatory Visit (HOSPITAL_COMMUNITY): Payer: Medicare PPO

## 2018-08-18 ENCOUNTER — Ambulatory Visit (HOSPITAL_COMMUNITY): Payer: Medicare PPO | Admitting: Anesthesiology

## 2018-08-18 ENCOUNTER — Encounter (HOSPITAL_COMMUNITY)
Admission: RE | Disposition: A | Discharge status: Home / Self Care | Payer: Self-pay | Source: Ambulatory Visit | Attending: Vascular Surgery

## 2018-08-18 ENCOUNTER — Encounter (HOSPITAL_COMMUNITY): Payer: Self-pay | Admitting: Anesthesiology

## 2018-08-18 ENCOUNTER — Ambulatory Visit (HOSPITAL_COMMUNITY)
Admission: RE | Admit: 2018-08-18 | Discharge: 2018-08-18 | Disposition: A | Discharge status: Home / Self Care | Payer: Medicare PPO | Source: Ambulatory Visit | Attending: Vascular Surgery | Admitting: Vascular Surgery

## 2018-08-18 DIAGNOSIS — E785 Hyperlipidemia, unspecified: Secondary | ICD-10-CM | POA: Diagnosis not present

## 2018-08-18 DIAGNOSIS — I120 Hypertensive chronic kidney disease with stage 5 chronic kidney disease or end stage renal disease: Secondary | ICD-10-CM | POA: Diagnosis not present

## 2018-08-18 DIAGNOSIS — Z992 Dependence on renal dialysis: Secondary | ICD-10-CM | POA: Diagnosis not present

## 2018-08-18 DIAGNOSIS — Z7982 Long term (current) use of aspirin: Secondary | ICD-10-CM | POA: Diagnosis not present

## 2018-08-18 DIAGNOSIS — Z794 Long term (current) use of insulin: Secondary | ICD-10-CM | POA: Diagnosis not present

## 2018-08-18 DIAGNOSIS — Z8673 Personal history of transient ischemic attack (TIA), and cerebral infarction without residual deficits: Secondary | ICD-10-CM | POA: Insufficient documentation

## 2018-08-18 DIAGNOSIS — E1122 Type 2 diabetes mellitus with diabetic chronic kidney disease: Secondary | ICD-10-CM | POA: Diagnosis not present

## 2018-08-18 DIAGNOSIS — R51 Headache: Secondary | ICD-10-CM | POA: Insufficient documentation

## 2018-08-18 DIAGNOSIS — M1990 Unspecified osteoarthritis, unspecified site: Secondary | ICD-10-CM | POA: Insufficient documentation

## 2018-08-18 DIAGNOSIS — Z9889 Other specified postprocedural states: Secondary | ICD-10-CM

## 2018-08-18 DIAGNOSIS — N186 End stage renal disease: Secondary | ICD-10-CM | POA: Diagnosis not present

## 2018-08-18 DIAGNOSIS — T82898A Other specified complication of vascular prosthetic devices, implants and grafts, initial encounter: Secondary | ICD-10-CM | POA: Insufficient documentation

## 2018-08-18 DIAGNOSIS — Z79899 Other long term (current) drug therapy: Secondary | ICD-10-CM | POA: Diagnosis not present

## 2018-08-18 DIAGNOSIS — Z419 Encounter for procedure for purposes other than remedying health state, unspecified: Secondary | ICD-10-CM

## 2018-08-18 DIAGNOSIS — X58XXXA Exposure to other specified factors, initial encounter: Secondary | ICD-10-CM | POA: Diagnosis not present

## 2018-08-18 DIAGNOSIS — N185 Chronic kidney disease, stage 5: Secondary | ICD-10-CM | POA: Diagnosis not present

## 2018-08-18 HISTORY — PX: INSERTION OF DIALYSIS CATHETER: SHX1324

## 2018-08-18 HISTORY — PX: AV FISTULA PLACEMENT: SHX1204

## 2018-08-18 LAB — POCT I-STAT 4, (NA,K, GLUC, HGB,HCT)
Glucose, Bld: 87 mg/dL (ref 70–99)
HEMATOCRIT: 27 % — AB (ref 36.0–46.0)
HEMOGLOBIN: 9.2 g/dL — AB (ref 12.0–15.0)
Potassium: 5 mmol/L (ref 3.5–5.1)
Sodium: 142 mmol/L (ref 135–145)

## 2018-08-18 LAB — GLUCOSE, CAPILLARY
GLUCOSE-CAPILLARY: 121 mg/dL — AB (ref 70–99)
Glucose-Capillary: 75 mg/dL (ref 70–99)
Glucose-Capillary: 113 mg/dL — ABNORMAL HIGH (ref 70–99)
Glucose-Capillary: 126 mg/dL — ABNORMAL HIGH (ref 70–99)

## 2018-08-18 SURGERY — INSERTION OF ARTERIOVENOUS (AV) GORE-TEX GRAFT ARM
Anesthesia: Monitor Anesthesia Care | Site: Arm Lower

## 2018-08-18 MED ORDER — MIDAZOLAM HCL 2 MG/2ML IJ SOLN
INTRAMUSCULAR | Status: AC
  Filled 2018-08-18: qty 2

## 2018-08-18 MED ORDER — ONDANSETRON HCL 4 MG/2ML IJ SOLN
INTRAMUSCULAR | Status: DC | PRN
  Administered 2018-08-18: 4 mg via INTRAVENOUS

## 2018-08-18 MED ORDER — CHLORHEXIDINE GLUCONATE 4 % EX LIQD: 60.0000 mL | Freq: Once | CUTANEOUS | Status: DC

## 2018-08-18 MED ORDER — LIDOCAINE 2% (20 MG/ML) 5 ML SYRINGE
INTRAMUSCULAR | Status: DC | PRN
  Administered 2018-08-18: 80 mg via INTRAVENOUS

## 2018-08-18 MED ORDER — LIDOCAINE-EPINEPHRINE 0.5 %-1:200000 IJ SOLN
INTRAMUSCULAR | Status: AC
  Filled 2018-08-18: qty 1

## 2018-08-18 MED ORDER — GLYCOPYRROLATE 0.2 MG/ML IJ SOLN
INTRAMUSCULAR | Status: DC | PRN
  Administered 2018-08-18 (×2): 0.1 mg via INTRAVENOUS

## 2018-08-18 MED ORDER — ONDANSETRON HCL 4 MG/2ML IJ SOLN
INTRAMUSCULAR | Status: AC
  Filled 2018-08-18: qty 2

## 2018-08-18 MED ORDER — CEFAZOLIN SODIUM-DEXTROSE 2-4 GM/100ML-% IV SOLN
2.0000 g | INTRAVENOUS | Status: AC
  Administered 2018-08-18: 2 g via INTRAVENOUS
  Filled 2018-08-18: qty 100

## 2018-08-18 MED ORDER — FENTANYL CITRATE (PF) 250 MCG/5ML IJ SOLN
INTRAMUSCULAR | Status: AC
  Filled 2018-08-18: qty 5

## 2018-08-18 MED ORDER — EPHEDRINE SULFATE-NACL 50-0.9 MG/10ML-% IV SOSY
PREFILLED_SYRINGE | INTRAVENOUS | Status: DC | PRN
  Administered 2018-08-18: 10 mg via INTRAVENOUS
  Administered 2018-08-18: 15 mg via INTRAVENOUS

## 2018-08-18 MED ORDER — KETAMINE HCL 50 MG/5ML IJ SOSY
PREFILLED_SYRINGE | INTRAMUSCULAR | Status: AC
  Filled 2018-08-18: qty 5

## 2018-08-18 MED ORDER — DEXTROSE 50 % IV SOLN
INTRAVENOUS | Status: AC
  Administered 2018-08-18: 25 mL via INTRAVENOUS
  Filled 2018-08-18: qty 50

## 2018-08-18 MED ORDER — SODIUM CHLORIDE 0.9 % IV SOLN
INTRAVENOUS | Status: AC
  Filled 2018-08-18: qty 1.2

## 2018-08-18 MED ORDER — 0.9 % SODIUM CHLORIDE (POUR BTL) OPTIME
TOPICAL | Status: DC | PRN
  Administered 2018-08-18: 1000 mL

## 2018-08-18 MED ORDER — SODIUM CHLORIDE 0.9 % IV SOLN
INTRAVENOUS | Status: DC | PRN
  Administered 2018-08-18: 09:00:00

## 2018-08-18 MED ORDER — HEPARIN SODIUM (PORCINE) 1000 UNIT/ML IJ SOLN
INTRAMUSCULAR | Status: DC | PRN
  Administered 2018-08-18: 3400 [IU]

## 2018-08-18 MED ORDER — DEXTROSE 50 % IV SOLN
25.0000 mL | Freq: Once | INTRAVENOUS | Status: AC
  Administered 2018-08-18: 25 mL via INTRAVENOUS
  Filled 2018-08-18: qty 50

## 2018-08-18 MED ORDER — FENTANYL CITRATE (PF) 250 MCG/5ML IJ SOLN
INTRAMUSCULAR | Status: DC | PRN
  Administered 2018-08-18: 50 ug via INTRAVENOUS
  Administered 2018-08-18: 25 ug via INTRAVENOUS

## 2018-08-18 MED ORDER — PROPOFOL 1000 MG/100ML IV EMUL
INTRAVENOUS | Status: AC
  Filled 2018-08-18: qty 100

## 2018-08-18 MED ORDER — GLYCOPYRROLATE PF 0.2 MG/ML IJ SOSY
PREFILLED_SYRINGE | INTRAMUSCULAR | Status: AC
  Filled 2018-08-18: qty 1

## 2018-08-18 MED ORDER — PHENYLEPHRINE 40 MCG/ML (10ML) SYRINGE FOR IV PUSH (FOR BLOOD PRESSURE SUPPORT)
PREFILLED_SYRINGE | INTRAVENOUS | Status: DC | PRN
  Administered 2018-08-18 (×2): 80 ug via INTRAVENOUS
  Administered 2018-08-18: 120 ug via INTRAVENOUS
  Administered 2018-08-18: 80 ug via INTRAVENOUS

## 2018-08-18 MED ORDER — HEPARIN SODIUM (PORCINE) 1000 UNIT/ML IJ SOLN
INTRAMUSCULAR | Status: AC
  Filled 2018-08-18: qty 1

## 2018-08-18 MED ORDER — PROPOFOL 10 MG/ML IV BOLUS
INTRAVENOUS | Status: DC | PRN
  Administered 2018-08-18: 50 mg via INTRAVENOUS

## 2018-08-18 MED ORDER — PROPOFOL 500 MG/50ML IV EMUL
INTRAVENOUS | Status: DC | PRN
  Administered 2018-08-18: 60 ug/kg/min via INTRAVENOUS

## 2018-08-18 MED ORDER — SODIUM CHLORIDE 0.9 % IV SOLN
INTRAVENOUS | Status: DC
  Administered 2018-08-18 (×2): via INTRAVENOUS

## 2018-08-18 MED ORDER — SODIUM CHLORIDE 0.9 % IV SOLN
INTRAVENOUS | Status: DC | PRN
  Administered 2018-08-18: 50 ug/min via INTRAVENOUS

## 2018-08-18 MED ORDER — LIDOCAINE 2% (20 MG/ML) 5 ML SYRINGE
INTRAMUSCULAR | Status: AC
  Filled 2018-08-18: qty 5

## 2018-08-18 MED ORDER — SODIUM CHLORIDE 0.9 % IV SOLN: INTRAVENOUS | Status: DC

## 2018-08-18 MED ORDER — FENTANYL CITRATE (PF) 100 MCG/2ML IJ SOLN: 25.0000 ug | INTRAMUSCULAR | Status: DC | PRN

## 2018-08-18 MED ORDER — LIDOCAINE-EPINEPHRINE 0.5 %-1:200000 IJ SOLN
INTRAMUSCULAR | Status: DC | PRN
  Administered 2018-08-18: 50 mL

## 2018-08-18 MED ORDER — KETAMINE HCL 10 MG/ML IJ SOLN
INTRAMUSCULAR | Status: DC | PRN
  Administered 2018-08-18: 15 mg via INTRAVENOUS

## 2018-08-18 MED ORDER — MIDAZOLAM HCL 5 MG/5ML IJ SOLN
INTRAMUSCULAR | Status: DC | PRN
  Administered 2018-08-18 (×2): 1 mg via INTRAVENOUS

## 2018-08-18 MED ORDER — OXYCODONE-ACETAMINOPHEN 5-325 MG PO TABS: 1.0000 | ORAL_TABLET | Freq: Four times a day (QID) | ORAL | 0 refills | Status: DC | PRN

## 2018-08-18 SURGICAL SUPPLY — 57 items
ARMBAND PINK RESTRICT EXTREMIT (MISCELLANEOUS) ×3 IMPLANT
BAG DECANTER FOR FLEXI CONT (MISCELLANEOUS) IMPLANT
BIOPATCH RED 1 DISK 7.0 (GAUZE/BANDAGES/DRESSINGS) ×3 IMPLANT
CANISTER SUCT 3000ML PPV (MISCELLANEOUS) ×3 IMPLANT
CANNULA VESSEL 3MM 2 BLNT TIP (CANNULA) ×3 IMPLANT
CATH PALINDROME RT-P 15FX23CM (CATHETERS) ×6 IMPLANT
CLIP LIGATING EXTRA MED SLVR (CLIP) ×3 IMPLANT
CLIP LIGATING EXTRA SM BLUE (MISCELLANEOUS) ×3 IMPLANT
COVER PROBE W GEL 5X96 (DRAPES) ×6 IMPLANT
COVER SURGICAL LIGHT HANDLE (MISCELLANEOUS) ×3 IMPLANT
DECANTER SPIKE VIAL GLASS SM (MISCELLANEOUS) IMPLANT
DERMABOND ADVANCED (GAUZE/BANDAGES/DRESSINGS) ×2
DERMABOND ADVANCED .7 DNX12 (GAUZE/BANDAGES/DRESSINGS) ×4 IMPLANT
DRAPE C-ARM 42X72 X-RAY (DRAPES) ×3 IMPLANT
DRAPE CHEST BREAST 15X10 FENES (DRAPES) ×3 IMPLANT
DRAPE HALF SHEET 40X57 (DRAPES) ×3 IMPLANT
ELECT REM PT RETURN 9FT ADLT (ELECTROSURGICAL) ×3
ELECTRODE REM PT RTRN 9FT ADLT (ELECTROSURGICAL) ×2 IMPLANT
GAUZE 4X4 16PLY RFD (DISPOSABLE) IMPLANT
GAUZE SPONGE 4X4 12PLY STRL (GAUZE/BANDAGES/DRESSINGS) IMPLANT
GLOVE BIO SURGEON STRL SZ7.5 (GLOVE) ×6 IMPLANT
GLOVE BIOGEL PI IND STRL 6.5 (GLOVE) ×4 IMPLANT
GLOVE BIOGEL PI IND STRL 7.0 (GLOVE) ×4 IMPLANT
GLOVE BIOGEL PI IND STRL 7.5 (GLOVE) ×6 IMPLANT
GLOVE BIOGEL PI INDICATOR 6.5 (GLOVE) ×2
GLOVE BIOGEL PI INDICATOR 7.0 (GLOVE) ×2
GLOVE BIOGEL PI INDICATOR 7.5 (GLOVE) ×3
GLOVE ECLIPSE 7.0 STRL STRAW (GLOVE) ×3 IMPLANT
GLOVE ECLIPSE STER SZ 5.5 (GLOVE) ×6 IMPLANT
GLOVE SS BIOGEL STRL SZ 7.5 (GLOVE) ×4 IMPLANT
GLOVE SUPERSENSE BIOGEL SZ 7.5 (GLOVE) ×2
GOWN STRL REUS W/ TWL LRG LVL3 (GOWN DISPOSABLE) ×12 IMPLANT
GOWN STRL REUS W/TWL LRG LVL3 (GOWN DISPOSABLE) ×6
GRAFT GORETEX STRT 4-7X45 (Vascular Products) ×3 IMPLANT
KIT BASIN OR (CUSTOM PROCEDURE TRAY) ×3 IMPLANT
KIT TURNOVER KIT B (KITS) ×3 IMPLANT
NEEDLE 18GX1X1/2 (RX/OR ONLY) (NEEDLE) ×3 IMPLANT
NEEDLE 22X1 1/2 (OR ONLY) (NEEDLE) IMPLANT
NEEDLE HYPO 25GX1X1/2 BEV (NEEDLE) ×3 IMPLANT
NS IRRIG 1000ML POUR BTL (IV SOLUTION) ×3 IMPLANT
PACK CV ACCESS (CUSTOM PROCEDURE TRAY) ×3 IMPLANT
PAD ARMBOARD 7.5X6 YLW CONV (MISCELLANEOUS) ×6 IMPLANT
SOAP 2 % CHG 4 OZ (WOUND CARE) ×3 IMPLANT
SUT ETHILON 3 0 PS 1 (SUTURE) ×3 IMPLANT
SUT PROLENE 6 0 CC (SUTURE) ×6 IMPLANT
SUT SILK 2 0 PERMA HAND 18 BK (SUTURE) IMPLANT
SUT VIC AB 3-0 SH 27 (SUTURE) ×2
SUT VIC AB 3-0 SH 27X BRD (SUTURE) ×4 IMPLANT
SUT VICRYL 4-0 PS2 18IN ABS (SUTURE) ×3 IMPLANT
SYR 10ML LL (SYRINGE) ×3 IMPLANT
SYR 20CC LL (SYRINGE) ×3 IMPLANT
SYR 5ML LL (SYRINGE) ×6 IMPLANT
SYR CONTROL 10ML LL (SYRINGE) IMPLANT
TOWEL GREEN STERILE (TOWEL DISPOSABLE) ×3 IMPLANT
TOWEL GREEN STERILE FF (TOWEL DISPOSABLE) ×3 IMPLANT
UNDERPAD 30X30 (UNDERPADS AND DIAPERS) ×3 IMPLANT
WATER STERILE IRR 1000ML POUR (IV SOLUTION) ×3 IMPLANT

## 2018-08-18 NOTE — Op Note (Signed)
    OPERATIVE REPORT  DATE OF SURGERY: 08/18/2018  PATIENT: [name], 67 y.o. female MRN: [id number]  DOB: [birth year]  PRE-OPERATIVE DIAGNOSIS: End-stage renal disease  POST-OPERATIVE DIAGNOSIS:  Same  PROCEDURE: #1 right IJ tunneled hemodialysis catheter with SonoSite visualization, #2 left forearm loop AV Gore-Tex graft  SURGEON:  Curt Jews, M.D.  PHYSICIAN ASSISTANT: Laurence Slate, PA-C  ANESTHESIA: Local with sedation  EBL: per anesthesia record  Total I/O In: -  Out: 20 [Blood:20]  BLOOD ADMINISTERED: none  DRAINS: none  SPECIMEN: none  COUNTS CORRECT:  YES  PATIENT DISPOSITION:  PACU - hemodynamically stable  PROCEDURE DETAILS: Patient was taken up in place to measure the area of the left and right neck were prepped draped you sterile fashion.  Using SonoSite visualization the right internal jugular vein was entered with an 18-gauge needle and a guidewire was passed centrally.  This was confirmed with fluoroscopy.  Dilator and peel-away sheath was passed over the guidewire and the dilator and guidewire removed.  A 23 cm catheter was positioned through the peel-away sheath.  This was positioned level distal right atrium.  The catheter was brought through a subcutaneous tunnel through a separate stab incision and the 2 lm ports were attached.  Both lumens flushed and aspirated easily were locked 1000/cc heparin.  Catheter secured to skin with a 3-0 nylon stitch and the entry site was closed with a 4 sub-particular Vicryl stitch  Next attention was turned to the left arm.  This prepped and draped you sterile fashion.  Vision was made at the antecubital space and could not isolate the brachial artery.  Patient also had a good caliber brachial vein at this position.  A separate incision was made over the distal forearm in a loop configuration tunnel was created.  A 4 x 7 mm taper graft was brought through the tunnel.  The brachial artery was occluded proximally  distally and a small arteriotomy was created.  The 4 mm portion of the graft was slightly spatulated and sewn end-to-side to the artery with a running 6-0 Prolene suture.  This anastomosis tested and found to be adequate.  The graft was flushed with heparinized saline and reoccluded.  Next the brachial vein was occluded proximally distally and was opened with 11 blade sent lost 20 with Potts scissors.  The graft was cut to the appropriate length and was spatulated and sewn end-to-side to the vein with a running 6-0 Prolene suture.  Clamps removed and good thrill was noted.  Patient maintain a radial pulse which was augmented when the graft was occluded.  The wounds irrigated with saline.  Hemostasis with cautery.  Wounds were closed with 3-0 Vicryl in the subcutaneous and subcuticular tissue.  Sterile dressing was applied.   [name], M.D., [facility] 08/18/2018 11:41 AM

## 2018-08-18 NOTE — Interval H&P Note (Signed)
History and Physical Interval Note:  08/18/2018 8:02 AM  [name]  has presented today for surgery, with the diagnosis of CHRONIC KIDNEY DISEASE FOR HEMODIALYSIS ACCESS  The various methods of treatment have been discussed with the patient and family. After consideration of risks, benefits and other options for treatment, the patient has consented to  Procedure(s): INSERTION OF ARTERIOVENOUS (AV) GORE-TEX GRAFT LEFT ARM (Left) INSERTION OF DIALYSIS CATHETER (N/A) as a surgical intervention .  The patient's history has been reviewed, patient examined, no change in status, stable for surgery.  I have reviewed the patient's chart and labs.  Questions were answered to the patient's satisfaction.     Curt Jews

## 2018-08-18 NOTE — Anesthesia Preprocedure Evaluation (Addendum)
Anesthesia Evaluation  Patient identified by MRN, date of birth, ID band Patient awake    Reviewed: Allergy & Precautions, NPO status , Patient's Chart, lab work & pertinent test results  Airway Mallampati: II  TM Distance: >3 FB Neck ROM: Full    Dental  (+) Edentulous Upper   Pulmonary neg pulmonary ROS,    breath sounds clear to auscultation       Cardiovascular hypertension, Pt. on medications and Pt. on home beta blockers  Rhythm:Regular Rate:Normal     Neuro/Psych  Headaches, CVA    GI/Hepatic GERD  Medicated,  Endo/Other  diabetes, Type 2, Insulin Dependent  Renal/GU ESRFRenal disease     Musculoskeletal  (+) Arthritis ,   Abdominal Normal abdominal exam  (+)   Peds  Hematology   Anesthesia Other Findings - HLD  Reproductive/Obstetrics                            Lab Results  Component Value Date   HGB 8.5 (L) 06/13/2018   HCT 25.0 (L) 06/13/2018   No results found for: INR, PROTIME  EKG: normal sinus rhythm.  Anesthesia Physical Anesthesia Plan  ASA: III  Anesthesia Plan: MAC   Post-op Pain Management:    Induction: Intravenous  PONV Risk Score and Plan: 3 and Ondansetron, Propofol infusion and Midazolam  Airway Management Planned: Simple Face Mask  Additional Equipment: None  Intra-op Plan:   Post-operative Plan:   Informed Consent: I have reviewed the patients History and Physical, chart, labs and discussed the procedure including the risks, benefits and alternatives for the proposed anesthesia with the patient or authorized representative who has indicated his/her understanding and acceptance.   Dental advisory given  Plan Discussed with: CRNA  Anesthesia Plan Comments:        Anesthesia Quick Evaluation

## 2018-08-18 NOTE — Discharge Instructions (Signed)
° °  [facility] [place] ° °Discharge Instructions ° °AV Fistula or Graft Surgery for Dialysis Access ° °Please refer to the following instructions for your post-procedure care. Your surgeon or physician assistant will discuss any changes with you. ° °Activity ° °You may drive the day following your surgery, if you are comfortable and no longer taking prescription pain medication. Resume full activity as the soreness in your incision resolves. ° °Bathing/Showering ° °You may shower after you go home. Keep your incision dry for 48 hours. Do not soak in a bathtub, hot tub, or swim until the incision heals completely. You may not shower if you have a hemodialysis catheter. ° °Incision Care ° °Clean your incision with mild soap and water after 48 hours. Pat the area dry with a clean towel. You do not need a bandage unless otherwise instructed. Do not apply any ointments or creams to your incision. You may have skin glue on your incision. Do not peel it off. It will come off on its own in about one week. Your arm may swell a bit after surgery. To reduce swelling use pillows to elevate your arm so it is above your heart. Your doctor will tell you if you need to lightly wrap your arm with an ACE bandage. ° °Diet ° °Resume your normal diet. There are not special food restrictions following this procedure. In order to heal from your surgery, it is CRITICAL to get adequate nutrition. Your body requires vitamins, minerals, and protein. Vegetables are the best source of vitamins and minerals. Vegetables also provide the perfect balance of protein. Processed food has little nutritional value, so try to avoid this. ° °Medications ° °Resume taking all of your medications. If your incision is causing pain, you may take over-the counter pain relievers such as acetaminophen (Tylenol). If you were prescribed a stronger pain medication, please be aware these medications can cause nausea and constipation. Prevent  nausea by taking the medication with a snack or meal. Avoid constipation by drinking plenty of fluids and eating foods with high amount of fiber, such as fruits, vegetables, and grains. Do not take Tylenol if you are taking prescription pain medications. ° ° ° ° °Follow up °Your surgeon may want to see you in the office following your access surgery. If so, this will be arranged at the time of your surgery. ° °Please call us immediately for any of the following conditions: ° °Increased pain, redness, drainage (pus) from your incision site °Fever of 101 degrees or higher °Severe or worsening pain at your incision site °Hand pain or numbness. ° °Reduce your risk of vascular disease: ° °Stop smoking. If you would like help, call [facility] at 1-800-QUIT-NOW (1-800-784-8669) or [place] at 336-586-4000 ° °Manage your cholesterol °Maintain a desired weight °Control your diabetes °Keep your blood pressure down ° °Dialysis ° °It will take several weeks to several months for your new dialysis access to be ready for use. Your surgeon will determine when it is OK to use it. Your nephrologist will continue to direct your dialysis. You can continue to use your Permcath until your new access is ready for use. ° °If you have any questions, please call the office at [phone]. ° °

## 2018-08-18 NOTE — Transfer of Care (Signed)
Immediate Anesthesia Transfer of Care Note  Patient: [name]  Procedure(s) Performed: INSERTION OF 4-7MM X 45CM ARTERIOVENOUS (AV) GORE-TEX GRAFT LEFT  FOREARM (Left Arm Lower) INSERTION OF DIALYSIS CATHETER (N/A )  Patient Location: PACU  Anesthesia Type:MAC  Level of Consciousness: awake, alert  and oriented  Airway & Oxygen Therapy: Patient Spontanous Breathing and Patient connected to nasal cannula oxygen  Post-op Assessment: Report given to RN and Post -op Vital signs reviewed and stable  Post vital signs: Reviewed and stable  Last Vitals:  Vitals Value Taken Time  BP 111/63 08/18/2018 11:32 AM  Temp    Pulse 86 08/18/2018 11:34 AM  Resp 21 08/18/2018 11:34 AM  SpO2 100 % 08/18/2018 11:34 AM  Vitals shown include unvalidated device data.  Last Pain:  Vitals:   08/18/18 0808  PainSc: 0-No pain      Patients Stated Pain Goal: 0 (01/60/10 9323)  Complications: No apparent anesthesia complications

## 2018-08-18 NOTE — Anesthesia Postprocedure Evaluation (Signed)
Anesthesia Post Note  Patient: Personnel officer  Procedure(s) Performed: INSERTION OF 4-7MM X 45CM ARTERIOVENOUS (AV) GORE-TEX GRAFT LEFT  FOREARM (Left Arm Lower) INSERTION OF DIALYSIS CATHETER (N/A )     Patient location during evaluation: PACU Anesthesia Type: MAC Level of consciousness: awake and alert Pain management: pain level controlled Vital Signs Assessment: post-procedure vital signs reviewed and stable Respiratory status: spontaneous breathing, nonlabored ventilation, respiratory function stable and patient connected to nasal cannula oxygen Cardiovascular status: stable and blood pressure returned to baseline Postop Assessment: no apparent nausea or vomiting Anesthetic complications: no    Last Vitals:  Vitals:   08/18/18 1210 08/18/18 1220  BP: 102/71 107/60  Pulse: 87 60  Resp: 14   Temp: (!) 36.2 C   SpO2: 100% 100%    Last Pain:  Vitals:   08/18/18 1220  PainSc: 0-No pain                 Effie Berkshire

## 2018-08-18 NOTE — Anesthesia Procedure Notes (Signed)
Procedure Name: MAC Date/Time: 08/18/2018 9:59 AM Performed by: Teressa Lower., CRNA Pre-anesthesia Checklist: Patient identified, Emergency Drugs available, Suction available, Patient being monitored and Timeout performed Patient Re-evaluated:Patient Re-evaluated prior to induction Oxygen Delivery Method: Simple face mask

## 2018-08-19 ENCOUNTER — Encounter (HOSPITAL_COMMUNITY): Payer: Self-pay | Admitting: Vascular Surgery

## 2018-08-24 DIAGNOSIS — Z23 Encounter for immunization: Secondary | ICD-10-CM | POA: Diagnosis not present

## 2018-08-24 DIAGNOSIS — Z992 Dependence on renal dialysis: Secondary | ICD-10-CM | POA: Diagnosis not present

## 2018-08-24 DIAGNOSIS — N186 End stage renal disease: Secondary | ICD-10-CM | POA: Diagnosis not present

## 2018-08-26 DIAGNOSIS — Z23 Encounter for immunization: Secondary | ICD-10-CM | POA: Diagnosis not present

## 2018-08-26 DIAGNOSIS — Z992 Dependence on renal dialysis: Secondary | ICD-10-CM | POA: Diagnosis not present

## 2018-08-26 DIAGNOSIS — N186 End stage renal disease: Secondary | ICD-10-CM | POA: Diagnosis not present

## 2018-08-29 DIAGNOSIS — N186 End stage renal disease: Secondary | ICD-10-CM | POA: Diagnosis not present

## 2018-08-29 DIAGNOSIS — Z23 Encounter for immunization: Secondary | ICD-10-CM | POA: Diagnosis not present

## 2018-08-29 DIAGNOSIS — Z992 Dependence on renal dialysis: Secondary | ICD-10-CM | POA: Diagnosis not present

## 2018-08-31 DIAGNOSIS — Z992 Dependence on renal dialysis: Secondary | ICD-10-CM | POA: Diagnosis not present

## 2018-08-31 DIAGNOSIS — N186 End stage renal disease: Secondary | ICD-10-CM | POA: Diagnosis not present

## 2018-08-31 DIAGNOSIS — Z23 Encounter for immunization: Secondary | ICD-10-CM | POA: Diagnosis not present

## 2018-09-02 DIAGNOSIS — N186 End stage renal disease: Secondary | ICD-10-CM | POA: Diagnosis not present

## 2018-09-02 DIAGNOSIS — Z992 Dependence on renal dialysis: Secondary | ICD-10-CM | POA: Diagnosis not present

## 2018-09-02 DIAGNOSIS — Z23 Encounter for immunization: Secondary | ICD-10-CM | POA: Diagnosis not present

## 2018-09-05 DIAGNOSIS — N186 End stage renal disease: Secondary | ICD-10-CM | POA: Diagnosis not present

## 2018-09-05 DIAGNOSIS — Z992 Dependence on renal dialysis: Secondary | ICD-10-CM | POA: Diagnosis not present

## 2018-09-05 DIAGNOSIS — Z23 Encounter for immunization: Secondary | ICD-10-CM | POA: Diagnosis not present

## 2018-09-07 DIAGNOSIS — N186 End stage renal disease: Secondary | ICD-10-CM | POA: Diagnosis not present

## 2018-09-07 DIAGNOSIS — Z23 Encounter for immunization: Secondary | ICD-10-CM | POA: Diagnosis not present

## 2018-09-07 DIAGNOSIS — Z992 Dependence on renal dialysis: Secondary | ICD-10-CM | POA: Diagnosis not present

## 2018-09-09 DIAGNOSIS — Z77098 Contact with and (suspected) exposure to other hazardous, chiefly nonmedicinal, chemicals: Secondary | ICD-10-CM | POA: Diagnosis not present

## 2018-09-09 DIAGNOSIS — N186 End stage renal disease: Secondary | ICD-10-CM | POA: Diagnosis not present

## 2018-09-09 DIAGNOSIS — E785 Hyperlipidemia, unspecified: Secondary | ICD-10-CM | POA: Diagnosis not present

## 2018-09-09 DIAGNOSIS — Z7982 Long term (current) use of aspirin: Secondary | ICD-10-CM | POA: Diagnosis not present

## 2018-09-09 DIAGNOSIS — Z23 Encounter for immunization: Secondary | ICD-10-CM | POA: Diagnosis not present

## 2018-09-09 DIAGNOSIS — W19XXXA Unspecified fall, initial encounter: Secondary | ICD-10-CM | POA: Diagnosis not present

## 2018-09-09 DIAGNOSIS — Z79899 Other long term (current) drug therapy: Secondary | ICD-10-CM | POA: Diagnosis not present

## 2018-09-09 DIAGNOSIS — R55 Syncope and collapse: Secondary | ICD-10-CM | POA: Diagnosis not present

## 2018-09-09 DIAGNOSIS — R42 Dizziness and giddiness: Secondary | ICD-10-CM | POA: Diagnosis not present

## 2018-09-09 DIAGNOSIS — I10 Essential (primary) hypertension: Secondary | ICD-10-CM | POA: Diagnosis not present

## 2018-09-09 DIAGNOSIS — Z794 Long term (current) use of insulin: Secondary | ICD-10-CM | POA: Diagnosis not present

## 2018-09-09 DIAGNOSIS — E876 Hypokalemia: Secondary | ICD-10-CM | POA: Diagnosis not present

## 2018-09-09 DIAGNOSIS — E119 Type 2 diabetes mellitus without complications: Secondary | ICD-10-CM | POA: Diagnosis not present

## 2018-09-09 DIAGNOSIS — S0990XA Unspecified injury of head, initial encounter: Secondary | ICD-10-CM | POA: Diagnosis not present

## 2018-09-09 DIAGNOSIS — Z992 Dependence on renal dialysis: Secondary | ICD-10-CM | POA: Diagnosis not present

## 2018-09-12 DIAGNOSIS — N186 End stage renal disease: Secondary | ICD-10-CM | POA: Diagnosis not present

## 2018-09-12 DIAGNOSIS — Z23 Encounter for immunization: Secondary | ICD-10-CM | POA: Diagnosis not present

## 2018-09-12 DIAGNOSIS — Z992 Dependence on renal dialysis: Secondary | ICD-10-CM | POA: Diagnosis not present

## 2018-09-14 DIAGNOSIS — Z23 Encounter for immunization: Secondary | ICD-10-CM | POA: Diagnosis not present

## 2018-09-14 DIAGNOSIS — Z992 Dependence on renal dialysis: Secondary | ICD-10-CM | POA: Diagnosis not present

## 2018-09-14 DIAGNOSIS — N186 End stage renal disease: Secondary | ICD-10-CM | POA: Diagnosis not present

## 2018-09-16 DIAGNOSIS — Z23 Encounter for immunization: Secondary | ICD-10-CM | POA: Diagnosis not present

## 2018-09-16 DIAGNOSIS — Z992 Dependence on renal dialysis: Secondary | ICD-10-CM | POA: Diagnosis not present

## 2018-09-16 DIAGNOSIS — N186 End stage renal disease: Secondary | ICD-10-CM | POA: Diagnosis not present

## 2018-09-19 DIAGNOSIS — N186 End stage renal disease: Secondary | ICD-10-CM | POA: Diagnosis not present

## 2018-09-19 DIAGNOSIS — Z992 Dependence on renal dialysis: Secondary | ICD-10-CM | POA: Diagnosis not present

## 2018-09-19 DIAGNOSIS — Z23 Encounter for immunization: Secondary | ICD-10-CM | POA: Diagnosis not present

## 2018-09-20 DIAGNOSIS — N184 Chronic kidney disease, stage 4 (severe): Secondary | ICD-10-CM | POA: Diagnosis not present

## 2018-09-20 DIAGNOSIS — E1122 Type 2 diabetes mellitus with diabetic chronic kidney disease: Secondary | ICD-10-CM | POA: Diagnosis not present

## 2018-09-20 DIAGNOSIS — Z7982 Long term (current) use of aspirin: Secondary | ICD-10-CM | POA: Diagnosis not present

## 2018-09-20 DIAGNOSIS — I129 Hypertensive chronic kidney disease with stage 1 through stage 4 chronic kidney disease, or unspecified chronic kidney disease: Secondary | ICD-10-CM | POA: Diagnosis not present

## 2018-09-20 DIAGNOSIS — M545 Low back pain: Secondary | ICD-10-CM | POA: Diagnosis not present

## 2018-09-20 DIAGNOSIS — I209 Angina pectoris, unspecified: Secondary | ICD-10-CM | POA: Diagnosis not present

## 2018-09-20 DIAGNOSIS — Z6824 Body mass index (BMI) 24.0-24.9, adult: Secondary | ICD-10-CM | POA: Diagnosis not present

## 2018-09-20 DIAGNOSIS — E875 Hyperkalemia: Secondary | ICD-10-CM | POA: Diagnosis not present

## 2018-09-20 DIAGNOSIS — K219 Gastro-esophageal reflux disease without esophagitis: Secondary | ICD-10-CM | POA: Diagnosis not present

## 2018-09-21 DIAGNOSIS — N186 End stage renal disease: Secondary | ICD-10-CM | POA: Diagnosis not present

## 2018-09-21 DIAGNOSIS — Z992 Dependence on renal dialysis: Secondary | ICD-10-CM | POA: Diagnosis not present

## 2018-09-21 DIAGNOSIS — Z23 Encounter for immunization: Secondary | ICD-10-CM | POA: Diagnosis not present

## 2018-09-22 DIAGNOSIS — Z992 Dependence on renal dialysis: Secondary | ICD-10-CM | POA: Diagnosis not present

## 2018-09-22 DIAGNOSIS — N186 End stage renal disease: Secondary | ICD-10-CM | POA: Diagnosis not present

## 2018-09-23 DIAGNOSIS — N186 End stage renal disease: Secondary | ICD-10-CM | POA: Diagnosis not present

## 2018-09-23 DIAGNOSIS — Z23 Encounter for immunization: Secondary | ICD-10-CM | POA: Diagnosis not present

## 2018-09-23 DIAGNOSIS — Z992 Dependence on renal dialysis: Secondary | ICD-10-CM | POA: Diagnosis not present

## 2018-09-26 ENCOUNTER — Other Ambulatory Visit: Payer: Self-pay

## 2018-09-26 DIAGNOSIS — N185 Chronic kidney disease, stage 5: Secondary | ICD-10-CM

## 2018-09-26 DIAGNOSIS — N186 End stage renal disease: Secondary | ICD-10-CM | POA: Diagnosis not present

## 2018-09-26 DIAGNOSIS — Z992 Dependence on renal dialysis: Secondary | ICD-10-CM | POA: Diagnosis not present

## 2018-09-26 DIAGNOSIS — Z23 Encounter for immunization: Secondary | ICD-10-CM | POA: Diagnosis not present

## 2018-09-27 ENCOUNTER — Other Ambulatory Visit: Payer: Self-pay | Admitting: *Deleted

## 2018-09-27 ENCOUNTER — Ambulatory Visit (INDEPENDENT_AMBULATORY_CARE_PROVIDER_SITE_OTHER)
Admission: RE | Admit: 2018-09-27 | Discharge: 2018-09-27 | Disposition: A | Discharge status: Home / Self Care | Payer: Medicare PPO | Source: Ambulatory Visit | Attending: Family | Admitting: Family

## 2018-09-27 ENCOUNTER — Ambulatory Visit (HOSPITAL_COMMUNITY)
Admission: RE | Admit: 2018-09-27 | Discharge: 2018-09-27 | Disposition: A | Discharge status: Home / Self Care | Payer: Medicare PPO | Source: Ambulatory Visit | Attending: Family | Admitting: Family

## 2018-09-27 ENCOUNTER — Other Ambulatory Visit: Payer: Self-pay

## 2018-09-27 ENCOUNTER — Encounter: Payer: Self-pay | Admitting: *Deleted

## 2018-09-27 ENCOUNTER — Encounter: Payer: Self-pay | Admitting: Family

## 2018-09-27 ENCOUNTER — Ambulatory Visit (INDEPENDENT_AMBULATORY_CARE_PROVIDER_SITE_OTHER): Payer: Self-pay | Admitting: Family

## 2018-09-27 VITALS — BP 152/87 | HR 97 | Temp 97.7°F | Resp 16 | Ht 67.0 in | Wt 150.0 lb

## 2018-09-27 DIAGNOSIS — T82868A Thrombosis of vascular prosthetic devices, implants and grafts, initial encounter: Secondary | ICD-10-CM

## 2018-09-27 DIAGNOSIS — N185 Chronic kidney disease, stage 5: Secondary | ICD-10-CM

## 2018-09-27 DIAGNOSIS — Z992 Dependence on renal dialysis: Secondary | ICD-10-CM

## 2018-09-27 DIAGNOSIS — N186 End stage renal disease: Secondary | ICD-10-CM

## 2018-09-27 NOTE — Progress Notes (Signed)
CC: no bruit x several days at left forearm loop graft  History of Present Illness  [name] is a 67 y.o. (1951/09/26) female who is s/p right IJ tunneled hemodialysis catheter with SonoSite visualization and left forearm loop AV Gore-Tex graft placement on 08-18-18 by Dr. Donnetta Romero.   She returns today, referred by Dr. Lowanda Romero with report of no bruit.  Pt denies any problems with her left UE.  She dialyzes MWF via [name] at right side of chest.    Past Medical History:  Diagnosis Date  . Arthritis    hands  . Chronic kidney disease    Stage 4  . Constipation   . Diabetes mellitus without complication ([name])   . GERD (gastroesophageal reflux disease)   . Headache   . Hyperlipidemia   . Hypertension   . Irregular heart rate   . Stroke [name])    TIA 10 years ago    Social History Social History   Tobacco Use  . Smoking status: Never Smoker  . Smokeless tobacco: Never Used  Substance Use Topics  . Alcohol use: Never    Frequency: Never  . Drug use: Never    Family History Family History  Problem Relation Age of Onset  . Heart murmur Mother   . Heart failure Mother   . Diabetes Mother   . Cirrhosis Father   . Alcoholism Father     Surgical History Past Surgical History:  Procedure Laterality Date  . ABDOMINAL HYSTERECTOMY    . AV FISTULA PLACEMENT Left 06/13/2018   Procedure: ARTERIOVENOUS (AV) FISTULA CREATION;  Surgeon: Rosetta Posner, MD;  Location: [name];  Service: Vascular;  Laterality: Left;  . AV FISTULA PLACEMENT Left 08/18/2018   Procedure: INSERTION OF 4-7MM X 45CM ARTERIOVENOUS (AV) GORE-TEX GRAFT LEFT  FOREARM;  Surgeon: Rosetta Posner, MD;  Location: [name];  Service: Vascular;  Laterality: Left;  . INSERTION OF DIALYSIS CATHETER N/A 08/18/2018   Procedure: INSERTION OF DIALYSIS CATHETER;  Surgeon: Rosetta Posner, MD;  Location: [name];  Service: Vascular;  Laterality: N/A;    No Known Allergies  Current Outpatient Medications  Medication Sig  Dispense Refill  . amLODipine (NORVASC) 10 MG tablet Take 10 mg by mouth daily.     Marland Kitchen aspirin EC 81 MG tablet Take 81 mg by mouth every evening.     . calcitRIOL (ROCALTROL) 0.5 MCG capsule Take 0.5 mcg by mouth daily.    . calcium carbonate (TUMS - DOSED IN MG ELEMENTAL CALCIUM) 500 MG chewable tablet Chew 1,000 mg by mouth 3 (three) times daily before meals.     . carvedilol (COREG) 6.25 MG tablet Take 6.25 mg by mouth 2 (two) times daily with a meal.    . fluticasone (FLONASE) 50 MCG/ACT nasal spray Place 1 spray into both nostrils daily as needed for allergies Romero rhinitis.     Marland Kitchen insulin NPH-regular Human (NOVOLIN 70/30) (70-30) 100 UNIT/ML injection Inject 40-45 Units into the skin See admin instructions. Inject 40 units in the morning and 45 units in the evening    . isosorbide mononitrate (IMDUR) 30 MG 24 hr tablet Take 30 mg by mouth daily.    Marland Kitchen labetalol (NORMODYNE) 100 MG tablet Take 100 mg by mouth 2 (two) times daily.    Marland Kitchen labetalol (NORMODYNE) 100 MG tablet TAKE ONE TABLET BY MOUTH TWICE DAILY.    . pantoprazole (PROTONIX) 40 MG tablet Take 40 mg by mouth daily.    [name]  8.4 g packet Take 8.4 g by mouth daily.     Marland Kitchen oxyCODONE-acetaminophen (PERCOCET/ROXICET) 5-325 MG tablet Take 1 tablet by mouth every 6 (six) hours as needed. (Patient not taking: Reported on 09/27/2018) 10 tablet 0   No current facility-administered medications for this visit.      REVIEW OF SYSTEMS: see HPI for pertinent positives and negatives    PHYSICAL EXAMINATION:  Vitals:   09/27/18 1316  BP: (!) 152/87  Pulse: 97  Resp: 16  Temp: 97.7 F (36.5 C)  TempSrc: Oral  SpO2: 100%  Weight: 150 lb (68 kg)  Height: 5\' 7"  (1.702 m)   Body mass index is 23.49 kg/m.  General: The patient appears her stated age.   HEENT:  No gross abnormalities Pulmonary: Respirations are non-labored Abdomen: Soft and non-tender Musculoskeletal: There are no major deformities.   Neurologic: No focal weakness Romero  paresthesias are detected Skin: There are no ulcer Romero rashes noted. Psychiatric: The patient has normal affect. Cardiovascular: There is a regular rate and rhythm without significant murmur appreciated.  2+ palpable bilateral radial pulses.  TDC at right side of chest. No palpable thrill at left forearm AV graft, pulse auscultated over AV graft, no bruit.    Medical Decision Making  [name] is a 67 y.o. female who is s/p right IJ tunneled hemodialysis catheter with SonoSite visualization and left forearm loop AV Gore-Tex graft placement on 08-18-18 by Dr. Donnetta Romero.   She returns today, referred by Dr. Lowanda Romero with report of no bruit.  Pt denies any problems with her left UE.  She dialyzes MWF via [name] at right side of chest.   Dr. Donnetta Romero spoke with and examined pt.  AC fossa, chronic thrombus. Will schedule for AV graft thrombectomy vs AV graft revision to upper left arm.    Clemon Chambers, RN, MSN, FNP-C [facility] [place] Office: [phone]  09/27/2018, 1:48 PM  Clinic MD: [facility] [place]

## 2018-09-28 ENCOUNTER — Other Ambulatory Visit: Payer: Self-pay

## 2018-09-28 ENCOUNTER — Encounter (HOSPITAL_COMMUNITY): Payer: Self-pay | Admitting: *Deleted

## 2018-09-28 DIAGNOSIS — N186 End stage renal disease: Secondary | ICD-10-CM | POA: Diagnosis not present

## 2018-09-28 DIAGNOSIS — Z23 Encounter for immunization: Secondary | ICD-10-CM | POA: Diagnosis not present

## 2018-09-28 DIAGNOSIS — Z992 Dependence on renal dialysis: Secondary | ICD-10-CM | POA: Diagnosis not present

## 2018-09-28 NOTE — Progress Notes (Signed)
Pt denies SOB, chest pain, and being under the care of a cardiologist. Pt denies having a cardiac cath but stated that a stress test was performed > 10 years ago. Pt made aware to stop taking vitamins, fish oil  and herbal medications. Do not take any NSAIDs ie: Ibuprofen, Advil, Naproxen (Aleve), Motrin, BC and Goody Powder. Pt made aware to take only 31 units of Novolin 70/30 tonight and no insulin on DOS. Pt made aware to check BG every 2 hours prior to arrival to hospital on DOS. Pt made aware to treat a BG < 70 with 4 ounces of apple juice, wait 15 minutes after intervention to recheck BG, if BG remains < 70, call Short Stay unit to speak with a nurse. Pt verbalized understanding of all pre-op instructions.

## 2018-09-29 ENCOUNTER — Ambulatory Visit (HOSPITAL_COMMUNITY): Payer: Medicare PPO | Admitting: Anesthesiology

## 2018-09-29 ENCOUNTER — Ambulatory Visit (HOSPITAL_COMMUNITY)
Admission: RE | Admit: 2018-09-29 | Discharge: 2018-09-29 | Disposition: A | Discharge status: Home / Self Care | Payer: Medicare PPO | Source: Ambulatory Visit | Attending: Vascular Surgery | Admitting: Vascular Surgery

## 2018-09-29 ENCOUNTER — Encounter (HOSPITAL_COMMUNITY): Payer: Self-pay | Admitting: General Practice

## 2018-09-29 ENCOUNTER — Encounter (HOSPITAL_COMMUNITY)
Admission: RE | Disposition: A | Discharge status: Home / Self Care | Payer: Self-pay | Source: Ambulatory Visit | Attending: Vascular Surgery

## 2018-09-29 DIAGNOSIS — I120 Hypertensive chronic kidney disease with stage 5 chronic kidney disease or end stage renal disease: Secondary | ICD-10-CM | POA: Diagnosis not present

## 2018-09-29 DIAGNOSIS — N186 End stage renal disease: Secondary | ICD-10-CM | POA: Diagnosis not present

## 2018-09-29 DIAGNOSIS — E1122 Type 2 diabetes mellitus with diabetic chronic kidney disease: Secondary | ICD-10-CM | POA: Diagnosis not present

## 2018-09-29 DIAGNOSIS — Z8673 Personal history of transient ischemic attack (TIA), and cerebral infarction without residual deficits: Secondary | ICD-10-CM | POA: Diagnosis not present

## 2018-09-29 DIAGNOSIS — Z992 Dependence on renal dialysis: Secondary | ICD-10-CM | POA: Diagnosis not present

## 2018-09-29 DIAGNOSIS — T82868A Thrombosis of vascular prosthetic devices, implants and grafts, initial encounter: Secondary | ICD-10-CM | POA: Diagnosis not present

## 2018-09-29 DIAGNOSIS — K219 Gastro-esophageal reflux disease without esophagitis: Secondary | ICD-10-CM | POA: Diagnosis not present

## 2018-09-29 DIAGNOSIS — Z794 Long term (current) use of insulin: Secondary | ICD-10-CM | POA: Insufficient documentation

## 2018-09-29 DIAGNOSIS — N185 Chronic kidney disease, stage 5: Secondary | ICD-10-CM | POA: Diagnosis not present

## 2018-09-29 HISTORY — PX: THROMBECTOMY AND REVISION OF ARTERIOVENTOUS (AV) GORETEX  GRAFT: SHX6120

## 2018-09-29 LAB — GLUCOSE, CAPILLARY
GLUCOSE-CAPILLARY: 59 mg/dL — AB (ref 70–99)
GLUCOSE-CAPILLARY: 117 mg/dL — AB (ref 70–99)
GLUCOSE-CAPILLARY: 79 mg/dL (ref 70–99)
Glucose-Capillary: 91 mg/dL (ref 70–99)
Glucose-Capillary: 76 mg/dL (ref 70–99)

## 2018-09-29 LAB — POCT I-STAT 4, (NA,K, GLUC, HGB,HCT)
Glucose, Bld: 66 mg/dL — ABNORMAL LOW (ref 70–99)
HCT: 30 % — ABNORMAL LOW (ref 36.0–46.0)
HEMOGLOBIN: 10.2 g/dL — AB (ref 12.0–15.0)
Potassium: 3.3 mmol/L — ABNORMAL LOW (ref 3.5–5.1)
Sodium: 143 mmol/L (ref 135–145)

## 2018-09-29 SURGERY — THROMBECTOMY AND REVISION OF ARTERIOVENTOUS (AV) GORETEX  GRAFT
Anesthesia: General | Site: Arm Upper | Laterality: Left

## 2018-09-29 MED ORDER — DEXTROSE 50 % IV SOLN
25.0000 mL | Freq: Once | INTRAVENOUS | Status: AC
  Administered 2018-09-29: 25 mL via INTRAVENOUS

## 2018-09-29 MED ORDER — MEPERIDINE HCL 50 MG/ML IJ SOLN: 6.2500 mg | INTRAMUSCULAR | Status: DC | PRN

## 2018-09-29 MED ORDER — DEXAMETHASONE SODIUM PHOSPHATE 10 MG/ML IJ SOLN
INTRAMUSCULAR | Status: DC | PRN
  Administered 2018-09-29: 5 mg via INTRAVENOUS

## 2018-09-29 MED ORDER — PROMETHAZINE HCL 25 MG/ML IJ SOLN: 6.2500 mg | INTRAMUSCULAR | Status: DC | PRN

## 2018-09-29 MED ORDER — CEFAZOLIN SODIUM-DEXTROSE 2-4 GM/100ML-% IV SOLN
INTRAVENOUS | Status: AC
  Filled 2018-09-29: qty 100

## 2018-09-29 MED ORDER — OXYCODONE-ACETAMINOPHEN 5-325 MG PO TABS: 1.0000 | ORAL_TABLET | Freq: Four times a day (QID) | ORAL | 0 refills | Status: DC | PRN

## 2018-09-29 MED ORDER — FENTANYL CITRATE (PF) 250 MCG/5ML IJ SOLN
INTRAMUSCULAR | Status: AC
  Filled 2018-09-29: qty 5

## 2018-09-29 MED ORDER — ONDANSETRON HCL 4 MG/2ML IJ SOLN
INTRAMUSCULAR | Status: DC | PRN
  Administered 2018-09-29: 4 mg via INTRAVENOUS

## 2018-09-29 MED ORDER — FENTANYL CITRATE (PF) 100 MCG/2ML IJ SOLN: 25.0000 ug | INTRAMUSCULAR | Status: DC | PRN

## 2018-09-29 MED ORDER — ONDANSETRON HCL 4 MG/2ML IJ SOLN
INTRAMUSCULAR | Status: AC
  Filled 2018-09-29: qty 2

## 2018-09-29 MED ORDER — DEXAMETHASONE SODIUM PHOSPHATE 10 MG/ML IJ SOLN
INTRAMUSCULAR | Status: AC
  Filled 2018-09-29: qty 1

## 2018-09-29 MED ORDER — LIDOCAINE-EPINEPHRINE (PF) 1 %-1:200000 IJ SOLN
INTRAMUSCULAR | Status: AC
  Filled 2018-09-29: qty 30

## 2018-09-29 MED ORDER — SODIUM CHLORIDE 0.9 % IV SOLN
INTRAVENOUS | Status: AC
  Filled 2018-09-29: qty 1.2

## 2018-09-29 MED ORDER — HEMOSTATIC AGENTS (NO CHARGE) OPTIME
TOPICAL | Status: DC | PRN
  Administered 2018-09-29: 1 via TOPICAL

## 2018-09-29 MED ORDER — PROPOFOL 10 MG/ML IV BOLUS
INTRAVENOUS | Status: AC
  Filled 2018-09-29: qty 20

## 2018-09-29 MED ORDER — 0.9 % SODIUM CHLORIDE (POUR BTL) OPTIME
TOPICAL | Status: DC | PRN
  Administered 2018-09-29: 1000 mL

## 2018-09-29 MED ORDER — EPHEDRINE 5 MG/ML INJ
INTRAVENOUS | Status: AC
  Filled 2018-09-29: qty 10

## 2018-09-29 MED ORDER — LIDOCAINE HCL (PF) 1 % IJ SOLN
INTRAMUSCULAR | Status: AC
  Filled 2018-09-29: qty 30

## 2018-09-29 MED ORDER — PHENYLEPHRINE 40 MCG/ML (10ML) SYRINGE FOR IV PUSH (FOR BLOOD PRESSURE SUPPORT)
PREFILLED_SYRINGE | INTRAVENOUS | Status: AC
  Filled 2018-09-29: qty 10

## 2018-09-29 MED ORDER — CEFAZOLIN SODIUM-DEXTROSE 2-4 GM/100ML-% IV SOLN
2.0000 g | INTRAVENOUS | Status: AC
  Administered 2018-09-29: 2 g via INTRAVENOUS

## 2018-09-29 MED ORDER — SODIUM CHLORIDE 0.9 % IV SOLN
INTRAVENOUS | Status: DC | PRN
  Administered 2018-09-29: 500 mL

## 2018-09-29 MED ORDER — MIDAZOLAM HCL 2 MG/2ML IJ SOLN: 0.5000 mg | Freq: Once | INTRAMUSCULAR | Status: DC | PRN

## 2018-09-29 MED ORDER — SODIUM CHLORIDE 0.9 % IV SOLN
INTRAVENOUS | Status: DC
  Administered 2018-09-29: 09:00:00 via INTRAVENOUS

## 2018-09-29 MED ORDER — LIDOCAINE 2% (20 MG/ML) 5 ML SYRINGE
INTRAMUSCULAR | Status: DC | PRN
  Administered 2018-09-29: 40 mg via INTRAVENOUS

## 2018-09-29 MED ORDER — PHENYLEPHRINE 40 MCG/ML (10ML) SYRINGE FOR IV PUSH (FOR BLOOD PRESSURE SUPPORT)
PREFILLED_SYRINGE | INTRAVENOUS | Status: DC | PRN
  Administered 2018-09-29 (×2): 120 ug via INTRAVENOUS
  Administered 2018-09-29 (×2): 80 ug via INTRAVENOUS

## 2018-09-29 MED ORDER — PROPOFOL 10 MG/ML IV BOLUS
INTRAVENOUS | Status: DC | PRN
  Administered 2018-09-29: 150 mg via INTRAVENOUS

## 2018-09-29 MED ORDER — LIDOCAINE 2% (20 MG/ML) 5 ML SYRINGE
INTRAMUSCULAR | Status: AC
  Filled 2018-09-29: qty 5

## 2018-09-29 MED ORDER — EPHEDRINE SULFATE-NACL 50-0.9 MG/10ML-% IV SOSY
PREFILLED_SYRINGE | INTRAVENOUS | Status: DC | PRN
  Administered 2018-09-29 (×4): 10 mg via INTRAVENOUS

## 2018-09-29 MED ORDER — DEXTROSE 50 % IV SOLN
INTRAVENOUS | Status: AC
  Filled 2018-09-29: qty 50

## 2018-09-29 MED ORDER — FENTANYL CITRATE (PF) 100 MCG/2ML IJ SOLN
INTRAMUSCULAR | Status: DC | PRN
  Administered 2018-09-29: 25 ug via INTRAVENOUS
  Administered 2018-09-29 (×2): 50 ug via INTRAVENOUS
  Administered 2018-09-29: 25 ug via INTRAVENOUS

## 2018-09-29 MED ORDER — MIDAZOLAM HCL 2 MG/2ML IJ SOLN
INTRAMUSCULAR | Status: AC
  Filled 2018-09-29: qty 2

## 2018-09-29 SURGICAL SUPPLY — 33 items
ARMBAND PINK RESTRICT EXTREMIT (MISCELLANEOUS) ×4 IMPLANT
CANISTER SUCT 3000ML PPV (MISCELLANEOUS) ×2 IMPLANT
CATH EMB 4FR 80CM (CATHETERS) ×2 IMPLANT
CLIP VESOCCLUDE MED 6/CT (CLIP) ×2 IMPLANT
CLIP VESOCCLUDE SM WIDE 6/CT (CLIP) ×2 IMPLANT
COVER WAND RF STERILE (DRAPES) ×2 IMPLANT
DERMABOND ADVANCED (GAUZE/BANDAGES/DRESSINGS) ×2
DERMABOND ADVANCED .7 DNX12 (GAUZE/BANDAGES/DRESSINGS) ×2 IMPLANT
ELECT REM PT RETURN 9FT ADLT (ELECTROSURGICAL) ×2
ELECTRODE REM PT RTRN 9FT ADLT (ELECTROSURGICAL) ×1 IMPLANT
GLOVE BIO SURGEON STRL SZ7.5 (GLOVE) ×2 IMPLANT
GOWN STRL REUS W/ TWL LRG LVL3 (GOWN DISPOSABLE) ×2 IMPLANT
GOWN STRL REUS W/ TWL XL LVL3 (GOWN DISPOSABLE) ×1 IMPLANT
GOWN STRL REUS W/TWL LRG LVL3 (GOWN DISPOSABLE) ×2
GOWN STRL REUS W/TWL XL LVL3 (GOWN DISPOSABLE) ×1
GRAFT GORETEX STRT 4-7X45 (Vascular Products) ×2 IMPLANT
HEMOSTAT SNOW SURGICEL 2X4 (HEMOSTASIS) ×2 IMPLANT
INSERT FOGARTY SM (MISCELLANEOUS) ×2 IMPLANT
KIT BASIN OR (CUSTOM PROCEDURE TRAY) ×2 IMPLANT
KIT TURNOVER KIT B (KITS) ×2 IMPLANT
NS IRRIG 1000ML POUR BTL (IV SOLUTION) ×2 IMPLANT
PACK CV ACCESS (CUSTOM PROCEDURE TRAY) ×2 IMPLANT
PAD ARMBOARD 7.5X6 YLW CONV (MISCELLANEOUS) ×4 IMPLANT
SUT GORETEX 6.0 TH-9 30 IN (SUTURE) ×2 IMPLANT
SUT GORETEX CV-6TTC-13 36IN (SUTURE) ×2 IMPLANT
SUT MNCRL AB 4-0 PS2 18 (SUTURE) ×2 IMPLANT
SUT PROLENE 5 0 C 1 24 (SUTURE) ×2 IMPLANT
SUT PROLENE 6 0 BV (SUTURE) ×2 IMPLANT
SUT VIC AB 3-0 SH 27 (SUTURE) ×2
SUT VIC AB 3-0 SH 27X BRD (SUTURE) ×2 IMPLANT
TOWEL GREEN STERILE (TOWEL DISPOSABLE) ×2 IMPLANT
UNDERPAD 30X30 (UNDERPADS AND DIAPERS) ×2 IMPLANT
WATER STERILE IRR 1000ML POUR (IV SOLUTION) ×2 IMPLANT

## 2018-09-29 NOTE — Transfer of Care (Signed)
Immediate Anesthesia Transfer of Care Note  Patient: [name]  Procedure(s) Performed: INSERTION OF ARTERIOVENTOUS (AV) GORETEX  GRAFT ARM (Left Arm Upper)  Patient Location: PACU  Anesthesia Type:General  Level of Consciousness: awake, oriented and drowsy  Airway & Oxygen Therapy: Patient Spontanous Breathing and Patient connected to nasal cannula oxygen  Post-op Assessment: Report given to RN and Post -op Vital signs reviewed and stable  Post vital signs: Reviewed and stable  Last Vitals:  Vitals Value Taken Time  BP 131/72 09/29/2018 11:42 AM  Temp    Pulse 87 09/29/2018 11:44 AM  Resp 15 09/29/2018 11:44 AM  SpO2 100 % 09/29/2018 11:44 AM  Vitals shown include unvalidated device data.  Last Pain:  Vitals:   09/29/18 0835  TempSrc:   PainSc: 0-No pain      Patients Stated Pain Goal: 2 (35/39/12 2583)  Complications: No apparent anesthesia complications

## 2018-09-29 NOTE — Anesthesia Procedure Notes (Signed)
Procedure Name: LMA Insertion Date/Time: 09/29/2018 10:33 AM Performed by: Trinna Post., CRNA Pre-anesthesia Checklist: Patient identified, Emergency Drugs available, Suction available, Patient being monitored and Timeout performed Patient Re-evaluated:Patient Re-evaluated prior to induction Oxygen Delivery Method: Circle system utilized Preoxygenation: Pre-oxygenation with 100% oxygen Induction Type: IV induction Ventilation: Mask ventilation without difficulty LMA: LMA inserted LMA Size: 4.0 Number of attempts: 1 Placement Confirmation: positive ETCO2 and breath sounds checked- equal and bilateral Tube secured with: Tape Dental Injury: Teeth and Oropharynx as per pre-operative assessment

## 2018-09-29 NOTE — H&P (Signed)
   History and Physical Update  The patient was interviewed and re-examined.  The patient's previous History and Physical has been reviewed and is unchanged from recent office visit. Plan for left arm av graft revision vs new graft.   Madisan Bice C. Donzetta Matters, MD [facility] [place] Office: [phone] Pager: [phone]   09/29/2018, 8:29 AM

## 2018-09-29 NOTE — Progress Notes (Signed)
Hypoglycemic Event  CBG: 59  Treatment: D50 IV 25 mL  Symptoms: None  Follow-up CBG: Time: 0848 CBG Result: 117  Possible Reasons for Event: Inadequate meal intake due to surgery  Comments/MD notified: notified Dr. Glennon Mac.  Received verbal order for 1/2 amp of D50.  Will continue to monitor patient.     [name] R

## 2018-09-29 NOTE — Discharge Instructions (Signed)
° °  [facility] ° °Discharge Instructions ° °AV Fistula or Graft Surgery for Dialysis Access ° °Please refer to the following instructions for your post-procedure care. Your surgeon or physician assistant will discuss any changes with you. ° °Activity ° °You may drive the day following your surgery, if you are comfortable and no longer taking prescription pain medication. Resume full activity as the soreness in your incision resolves. ° °Bathing/Showering ° °You may shower after you go home. Keep your incision dry for 48 hours. Do not soak in a bathtub, hot tub, or swim until the incision heals completely. You may not shower if you have a hemodialysis catheter. ° °Incision Care ° °Clean your incision with mild soap and water after 48 hours. Pat the area dry with a clean towel. You do not need a bandage unless otherwise instructed. Do not apply any ointments or creams to your incision. You may have skin glue on your incision. Do not peel it off. It will come off on its own in about one week. Your arm may swell a bit after surgery. To reduce swelling use pillows to elevate your arm so it is above your heart. Your doctor will tell you if you need to lightly wrap your arm with an ACE bandage. ° °Diet ° °Resume your normal diet. There are not special food restrictions following this procedure. In order to heal from your surgery, it is CRITICAL to get adequate nutrition. Your body requires vitamins, minerals, and protein. Vegetables are the best source of vitamins and minerals. Vegetables also provide the perfect balance of protein. Processed food has little nutritional value, so try to avoid this. ° °Medications ° °Resume taking all of your medications. If your incision is causing pain, you may take over-the counter pain relievers such as acetaminophen (Tylenol). If you were prescribed a stronger pain medication, please be aware these medications can cause nausea and constipation. Prevent  nausea by taking the medication with a snack or meal. Avoid constipation by drinking plenty of fluids and eating foods with high amount of fiber, such as fruits, vegetables, and grains. Do not take Tylenol if you are taking prescription pain medications. ° ° ° ° °Follow up °Your surgeon may want to see you in the office following your access surgery. If so, this will be arranged at the time of your surgery. ° °Please call us immediately for any of the following conditions: ° °Increased pain, redness, drainage (pus) from your incision site °Fever of 101 degrees or higher °Severe or worsening pain at your incision site °Hand pain or numbness. ° °Reduce your risk of vascular disease: ° °Stop smoking. If you would like help, call [facility] at 1-800-QUIT-NOW (1-800-784-8669) or [place] at 336-586-4000 ° °Manage your cholesterol °Maintain a desired weight °Control your diabetes °Keep your blood pressure down ° °Dialysis ° °It will take several weeks to several months for your new dialysis access to be ready for use. Your surgeon will determine when it is OK to use it. Your nephrologist will continue to direct your dialysis. You can continue to use your Permcath until your new access is ready for use. ° °If you have any questions, please call the office at [phone]. ° °

## 2018-09-29 NOTE — Anesthesia Postprocedure Evaluation (Signed)
Anesthesia Post Note  Patient: Personnel officer  Procedure(s) Performed: INSERTION OF ARTERIOVENTOUS (AV) GORETEX  GRAFT ARM (Left Arm Upper)     Patient location during evaluation: PACU Anesthesia Type: General Level of consciousness: awake and alert, oriented and patient cooperative Pain management: pain level controlled Vital Signs Assessment: post-procedure vital signs reviewed and stable Respiratory status: spontaneous breathing, nonlabored ventilation and respiratory function stable Cardiovascular status: blood pressure returned to baseline and stable Postop Assessment: no apparent nausea or vomiting Anesthetic complications: no    Last Vitals:  Vitals:   09/29/18 1215 09/29/18 1229  BP: 131/75 (!) 141/79  Pulse: 80 83  Resp: 16 16  Temp:    SpO2: 99% 97%    Last Pain:  Vitals:   09/29/18 1229  TempSrc:   PainSc: 0-No pain                 Yehuda Printup,E. Laurice Iglesia

## 2018-09-29 NOTE — Op Note (Signed)
    Patient name: [name] MRN: [id number] DOB: 03-09-1951 Sex: female  09/29/2018 Pre-operative Diagnosis: End-stage renal disease Post-operative diagnosis:  Same Surgeon:  Erlene Quan C. Donzetta Matters, MD Assistant: Laurence Slate, PA Procedure Performed: Left arm brachial to axillary AV graft with 4-7 mm PTFE  Indications: 67 year old female with history of end-stage renal disease on dialysis via catheter.  She recently underwent placement of a left arm AV graft and a forearm loop configuration.  That is subsequent thrombosed she is now indicated for possible thrombectomy versus conversion to upper arm graft.  Findings: Previous graft was thrombosed.  I did not see any robust brachial veins for revision there was a large axillary vein and at completion of the brachial to axillary vein AV graft there was a strong thrill in the graft as well as a palpable radial pulse the wrist.   Procedure:  The patient was identified in the holding area and taken to the operating room where she is placed supine operative table and LMA anesthesia was induced.  She was sterilely prepped draped the left upper extremity usual fashion antibiotics were administered and a timeout was called.  We began using ultrasound to evaluate the brachial veins which appeared quite diminutive relative to the axillary vein.  With this we made a longitudinal incision overlying the axillary vein where we had marked dissected down and marked the vein for orientation.  We then made an incision over lying the palpable brachial pulse above the antecubitum dissected down placed a vessel loop around this.  A 4-7 mm graft was tunneled.  We then clamped the vein proximally distally opened longitudinally.  The graft was beveled trimmed to size and sewn end-to-side with 5-0 Prolene suture.  Upon completion we then flushed through the graft.  Graft was then trimmed to the size at the arterial end.  This was not beveled was 4 mm in diameter.  The artery was  clamped distally proximally opened longitudinally flushed with heparinized saline both directions.  The graft was then sewn end-to-side with 6-0 Prolene suture.  Prior to the completion of the anastomosis we allowed flushing all directions.  After that we had a strong thrill in the graft confirmed with Doppler the runoff vein.  There is also palpable radial pulse also confirmed with Doppler.  Satisfied with irrigated both wounds obtained hemostasis closed in layers with Vicryl and Monocryl.  Dermabond was placed at the level of the skin.  She was then awakened from anesthesia having tolerated the procedure well without immediate complication.  All counts were correct at completion.  EBL 25 cc.    Arianna Delsanto C. Donzetta Matters, MD [facility] Office: [phone] Pager: [phone]

## 2018-09-29 NOTE — Anesthesia Preprocedure Evaluation (Signed)
Anesthesia Evaluation  Patient identified by MRN, date of birth, ID band Patient awake    Reviewed: Allergy & Precautions, NPO status , Patient's Chart, lab work & pertinent test results  History of Anesthesia Complications Negative for: history of anesthetic complications  Airway Mallampati: II  TM Distance: >3 FB Neck ROM: Full    Dental  (+) Edentulous Upper, Poor Dentition, Missing, Dental Advisory Given   Pulmonary neg pulmonary ROS,    breath sounds clear to auscultation       Cardiovascular hypertension, Pt. on medications (-) angina Rhythm:Regular Rate:Normal  '09 ECHO: EF 55-60%, valves OK   Neuro/Psych  Headaches, CVA, No Residual Symptoms    GI/Hepatic Neg liver ROS, GERD  Medicated and Controlled,  Endo/Other  diabetes (glu 117), Insulin Dependent  Renal/GU Dialysis and ESRFRenal disease (MWF, K+ 3.3)     Musculoskeletal   Abdominal   Peds  Hematology   Anesthesia Other Findings   Reproductive/Obstetrics                             Anesthesia Physical Anesthesia Plan  ASA: III  Anesthesia Plan: General   Post-op Pain Management:    Induction: Intravenous  PONV Risk Score and Plan: 3 and Ondansetron, Dexamethasone and Treatment may vary due to age or medical condition  Airway Management Planned: LMA  Additional Equipment:   Intra-op Plan:   Post-operative Plan:   Informed Consent: I have reviewed the patients History and Physical, chart, labs and discussed the procedure including the risks, benefits and alternatives for the proposed anesthesia with the patient or authorized representative who has indicated his/her understanding and acceptance.   Dental advisory given  Plan Discussed with: CRNA and Surgeon  Anesthesia Plan Comments: (Plan routine monitors, GA- LMA OK)        Anesthesia Quick Evaluation

## 2018-09-30 ENCOUNTER — Encounter (HOSPITAL_COMMUNITY): Payer: Self-pay | Admitting: Vascular Surgery

## 2018-09-30 DIAGNOSIS — Z23 Encounter for immunization: Secondary | ICD-10-CM | POA: Diagnosis not present

## 2018-09-30 DIAGNOSIS — Z992 Dependence on renal dialysis: Secondary | ICD-10-CM | POA: Diagnosis not present

## 2018-09-30 DIAGNOSIS — N186 End stage renal disease: Secondary | ICD-10-CM | POA: Diagnosis not present

## 2018-10-03 DIAGNOSIS — T68XXXA Hypothermia, initial encounter: Secondary | ICD-10-CM | POA: Diagnosis not present

## 2018-10-03 DIAGNOSIS — N186 End stage renal disease: Secondary | ICD-10-CM | POA: Diagnosis not present

## 2018-10-03 DIAGNOSIS — I120 Hypertensive chronic kidney disease with stage 5 chronic kidney disease or end stage renal disease: Secondary | ICD-10-CM | POA: Diagnosis not present

## 2018-10-03 DIAGNOSIS — Z992 Dependence on renal dialysis: Secondary | ICD-10-CM | POA: Diagnosis not present

## 2018-10-03 DIAGNOSIS — R404 Transient alteration of awareness: Secondary | ICD-10-CM | POA: Diagnosis not present

## 2018-10-03 DIAGNOSIS — Z7982 Long term (current) use of aspirin: Secondary | ICD-10-CM | POA: Diagnosis not present

## 2018-10-03 DIAGNOSIS — R4020 Unspecified coma: Secondary | ICD-10-CM | POA: Diagnosis not present

## 2018-10-03 DIAGNOSIS — E162 Hypoglycemia, unspecified: Secondary | ICD-10-CM | POA: Diagnosis not present

## 2018-10-03 DIAGNOSIS — Z23 Encounter for immunization: Secondary | ICD-10-CM | POA: Diagnosis not present

## 2018-10-03 DIAGNOSIS — D631 Anemia in chronic kidney disease: Secondary | ICD-10-CM | POA: Diagnosis not present

## 2018-10-03 DIAGNOSIS — E1122 Type 2 diabetes mellitus with diabetic chronic kidney disease: Secondary | ICD-10-CM | POA: Diagnosis not present

## 2018-10-03 DIAGNOSIS — E11649 Type 2 diabetes mellitus with hypoglycemia without coma: Secondary | ICD-10-CM | POA: Diagnosis not present

## 2018-10-03 DIAGNOSIS — Z79899 Other long term (current) drug therapy: Secondary | ICD-10-CM | POA: Diagnosis not present

## 2018-10-03 DIAGNOSIS — I10 Essential (primary) hypertension: Secondary | ICD-10-CM | POA: Diagnosis not present

## 2018-10-03 DIAGNOSIS — E161 Other hypoglycemia: Secondary | ICD-10-CM | POA: Diagnosis not present

## 2018-10-05 DIAGNOSIS — Z992 Dependence on renal dialysis: Secondary | ICD-10-CM | POA: Diagnosis not present

## 2018-10-05 DIAGNOSIS — Z23 Encounter for immunization: Secondary | ICD-10-CM | POA: Diagnosis not present

## 2018-10-05 DIAGNOSIS — N186 End stage renal disease: Secondary | ICD-10-CM | POA: Diagnosis not present

## 2018-10-07 DIAGNOSIS — Z992 Dependence on renal dialysis: Secondary | ICD-10-CM | POA: Diagnosis not present

## 2018-10-07 DIAGNOSIS — Z23 Encounter for immunization: Secondary | ICD-10-CM | POA: Diagnosis not present

## 2018-10-07 DIAGNOSIS — N186 End stage renal disease: Secondary | ICD-10-CM | POA: Diagnosis not present

## 2018-10-10 DIAGNOSIS — I10 Essential (primary) hypertension: Secondary | ICD-10-CM | POA: Diagnosis not present

## 2018-10-10 DIAGNOSIS — E1122 Type 2 diabetes mellitus with diabetic chronic kidney disease: Secondary | ICD-10-CM | POA: Diagnosis not present

## 2018-10-10 DIAGNOSIS — Z23 Encounter for immunization: Secondary | ICD-10-CM | POA: Diagnosis not present

## 2018-10-10 DIAGNOSIS — Z6824 Body mass index (BMI) 24.0-24.9, adult: Secondary | ICD-10-CM | POA: Diagnosis not present

## 2018-10-10 DIAGNOSIS — E162 Hypoglycemia, unspecified: Secondary | ICD-10-CM | POA: Diagnosis not present

## 2018-10-10 DIAGNOSIS — I43 Cardiomyopathy in diseases classified elsewhere: Secondary | ICD-10-CM | POA: Diagnosis not present

## 2018-10-10 DIAGNOSIS — Z992 Dependence on renal dialysis: Secondary | ICD-10-CM | POA: Diagnosis not present

## 2018-10-10 DIAGNOSIS — N186 End stage renal disease: Secondary | ICD-10-CM | POA: Diagnosis not present

## 2018-10-12 DIAGNOSIS — N186 End stage renal disease: Secondary | ICD-10-CM | POA: Diagnosis not present

## 2018-10-12 DIAGNOSIS — Z992 Dependence on renal dialysis: Secondary | ICD-10-CM | POA: Diagnosis not present

## 2018-10-12 DIAGNOSIS — Z23 Encounter for immunization: Secondary | ICD-10-CM | POA: Diagnosis not present

## 2018-10-14 DIAGNOSIS — Z23 Encounter for immunization: Secondary | ICD-10-CM | POA: Diagnosis not present

## 2018-10-14 DIAGNOSIS — Z992 Dependence on renal dialysis: Secondary | ICD-10-CM | POA: Diagnosis not present

## 2018-10-14 DIAGNOSIS — N186 End stage renal disease: Secondary | ICD-10-CM | POA: Diagnosis not present

## 2018-10-17 DIAGNOSIS — Z992 Dependence on renal dialysis: Secondary | ICD-10-CM | POA: Diagnosis not present

## 2018-10-17 DIAGNOSIS — N186 End stage renal disease: Secondary | ICD-10-CM | POA: Diagnosis not present

## 2018-10-17 DIAGNOSIS — Z23 Encounter for immunization: Secondary | ICD-10-CM | POA: Diagnosis not present

## 2018-10-19 DIAGNOSIS — Z23 Encounter for immunization: Secondary | ICD-10-CM | POA: Diagnosis not present

## 2018-10-19 DIAGNOSIS — N186 End stage renal disease: Secondary | ICD-10-CM | POA: Diagnosis not present

## 2018-10-19 DIAGNOSIS — Z992 Dependence on renal dialysis: Secondary | ICD-10-CM | POA: Diagnosis not present

## 2018-10-21 DIAGNOSIS — Z23 Encounter for immunization: Secondary | ICD-10-CM | POA: Diagnosis not present

## 2018-10-21 DIAGNOSIS — N186 End stage renal disease: Secondary | ICD-10-CM | POA: Diagnosis not present

## 2018-10-21 DIAGNOSIS — Z992 Dependence on renal dialysis: Secondary | ICD-10-CM | POA: Diagnosis not present

## 2018-10-22 DIAGNOSIS — Z992 Dependence on renal dialysis: Secondary | ICD-10-CM | POA: Diagnosis not present

## 2018-10-22 DIAGNOSIS — N186 End stage renal disease: Secondary | ICD-10-CM | POA: Diagnosis not present

## 2018-10-24 DIAGNOSIS — N186 End stage renal disease: Secondary | ICD-10-CM | POA: Diagnosis not present

## 2018-10-24 DIAGNOSIS — Z23 Encounter for immunization: Secondary | ICD-10-CM | POA: Diagnosis not present

## 2018-10-24 DIAGNOSIS — Z992 Dependence on renal dialysis: Secondary | ICD-10-CM | POA: Diagnosis not present

## 2018-10-26 ENCOUNTER — Telehealth: Payer: Self-pay | Admitting: *Deleted

## 2018-10-26 ENCOUNTER — Encounter: Payer: Self-pay | Admitting: *Deleted

## 2018-10-26 DIAGNOSIS — Z23 Encounter for immunization: Secondary | ICD-10-CM | POA: Diagnosis not present

## 2018-10-26 DIAGNOSIS — N186 End stage renal disease: Secondary | ICD-10-CM | POA: Diagnosis not present

## 2018-10-26 DIAGNOSIS — Z992 Dependence on renal dialysis: Secondary | ICD-10-CM | POA: Diagnosis not present

## 2018-10-26 NOTE — Progress Notes (Signed)
Request for shuntogram of A/V graft left arm due to poor access flow per [place]. Schedule 10/27/18 with Dr. Carlis Abbott per Zigmund Daniel.

## 2018-10-26 NOTE — Telephone Encounter (Signed)
Spoke with [name]  At [facility]. To review pre-procedure instructions as per letter with patient.Joy confirmed fax received.

## 2018-10-27 ENCOUNTER — Ambulatory Visit (HOSPITAL_COMMUNITY)
Admission: RE | Admit: 2018-10-27 | Discharge: 2018-10-27 | Disposition: A | Discharge status: Home / Self Care | Payer: Medicare PPO | Attending: Vascular Surgery | Admitting: Vascular Surgery

## 2018-10-27 ENCOUNTER — Encounter (HOSPITAL_COMMUNITY): Payer: Self-pay | Admitting: Vascular Surgery

## 2018-10-27 ENCOUNTER — Encounter (HOSPITAL_COMMUNITY)
Admission: RE | Disposition: A | Discharge status: Home / Self Care | Payer: Self-pay | Source: Home / Self Care | Attending: Vascular Surgery

## 2018-10-27 ENCOUNTER — Other Ambulatory Visit: Payer: Self-pay

## 2018-10-27 DIAGNOSIS — Z794 Long term (current) use of insulin: Secondary | ICD-10-CM | POA: Diagnosis not present

## 2018-10-27 DIAGNOSIS — E1122 Type 2 diabetes mellitus with diabetic chronic kidney disease: Secondary | ICD-10-CM | POA: Insufficient documentation

## 2018-10-27 DIAGNOSIS — Z833 Family history of diabetes mellitus: Secondary | ICD-10-CM | POA: Diagnosis not present

## 2018-10-27 DIAGNOSIS — T82868A Thrombosis of vascular prosthetic devices, implants and grafts, initial encounter: Secondary | ICD-10-CM | POA: Insufficient documentation

## 2018-10-27 DIAGNOSIS — Z992 Dependence on renal dialysis: Secondary | ICD-10-CM | POA: Insufficient documentation

## 2018-10-27 DIAGNOSIS — Z79899 Other long term (current) drug therapy: Secondary | ICD-10-CM | POA: Diagnosis not present

## 2018-10-27 DIAGNOSIS — Z8673 Personal history of transient ischemic attack (TIA), and cerebral infarction without residual deficits: Secondary | ICD-10-CM | POA: Insufficient documentation

## 2018-10-27 DIAGNOSIS — Y832 Surgical operation with anastomosis, bypass or graft as the cause of abnormal reaction of the patient, or of later complication, without mention of misadventure at the time of the procedure: Secondary | ICD-10-CM | POA: Diagnosis not present

## 2018-10-27 DIAGNOSIS — N186 End stage renal disease: Secondary | ICD-10-CM | POA: Insufficient documentation

## 2018-10-27 DIAGNOSIS — I120 Hypertensive chronic kidney disease with stage 5 chronic kidney disease or end stage renal disease: Secondary | ICD-10-CM | POA: Insufficient documentation

## 2018-10-27 DIAGNOSIS — Z7982 Long term (current) use of aspirin: Secondary | ICD-10-CM | POA: Insufficient documentation

## 2018-10-27 DIAGNOSIS — Z8249 Family history of ischemic heart disease and other diseases of the circulatory system: Secondary | ICD-10-CM | POA: Diagnosis not present

## 2018-10-27 DIAGNOSIS — K219 Gastro-esophageal reflux disease without esophagitis: Secondary | ICD-10-CM | POA: Insufficient documentation

## 2018-10-27 DIAGNOSIS — Z9889 Other specified postprocedural states: Secondary | ICD-10-CM | POA: Insufficient documentation

## 2018-10-27 DIAGNOSIS — M1990 Unspecified osteoarthritis, unspecified site: Secondary | ICD-10-CM | POA: Diagnosis not present

## 2018-10-27 DIAGNOSIS — E785 Hyperlipidemia, unspecified: Secondary | ICD-10-CM | POA: Insufficient documentation

## 2018-10-27 HISTORY — PX: VENOGRAM: SHX5497

## 2018-10-27 LAB — GLUCOSE, CAPILLARY: Glucose-Capillary: 144 mg/dL — ABNORMAL HIGH (ref 70–99)

## 2018-10-27 LAB — POCT I-STAT, CHEM 8
BUN: 19 mg/dL (ref 8–23)
Calcium, Ion: 0.79 mmol/L — CL (ref 1.15–1.40)
Chloride: 107 mmol/L (ref 98–111)
Creatinine, Ser: 3.7 mg/dL — ABNORMAL HIGH (ref 0.44–1.00)
Glucose, Bld: 160 mg/dL — ABNORMAL HIGH (ref 70–99)
HCT: 33 % — ABNORMAL LOW (ref 36.0–46.0)
Hemoglobin: 11.2 g/dL — ABNORMAL LOW (ref 12.0–15.0)
Potassium: 4 mmol/L (ref 3.5–5.1)
Sodium: 138 mmol/L (ref 135–145)
TCO2: 27 mmol/L (ref 22–32)

## 2018-10-27 SURGERY — VENOGRAM

## 2018-10-27 MED ORDER — SODIUM CHLORIDE 0.9% FLUSH: 3.0000 mL | INTRAVENOUS | Status: DC | PRN

## 2018-10-27 MED ORDER — IODIXANOL 320 MG/ML IV SOLN
INTRAVENOUS | Status: DC | PRN
  Administered 2018-10-27: 60 mL via INTRAVENOUS

## 2018-10-27 SURGICAL SUPPLY — 2 items
STOPCOCK MORSE 400PSI 3WAY (MISCELLANEOUS) ×3 IMPLANT
TUBING CIL FLEX 10 FLL-RA (TUBING) ×3 IMPLANT

## 2018-10-27 NOTE — Op Note (Signed)
    OPERATIVE NOTE   PROCEDURE: 1. Bilateral upper extremity venogram  PRE-OPERATIVE DIAGNOSIS: Thrombosed left brachial artery to axillary vein AV graft  POST-OPERATIVE DIAGNOSIS: same as above   SURGEON: Marty Heck, MD  ANESTHESIA: local  ESTIMATED BLOOD LOSS: 5 cc  FINDING(S): 1. The left upper extremity brach ax graft is thrombosed.  Contrast in the left upper extremity was very sluggish to empty from the left arm and became very stagnant in the central venous system.  Had difficulty identifying contrast emptying into the right atrium definitively although no overt stenosis was identified once contrast reached the axillary vein and further centrally.  There were no superficial usable veins in the left arm. 2. The right upper extremity venous system emptied much more briskly even in the setting of a right IJ tunneled catheter.  There was no evidence of central stenosis.  The brachial veins were stenosed in the mid upper arm but the proximal brachial vein and axillary vein were widely patent and no overt other central stenosis.  SPECIMEN(S):  None  INDICATIONS: [name] is a 67 y.o. female who  presents with thrombosed left brachial artery to axillary vein graft.  Patient was initially scheduled for left upper extremity fistula gram.  In holding it was noted that the patient has a thrombosed graft after 3 weeks and this is the second graft she is thrombosed in the left arm after 3 weeks each.  We subsequent recommended a venogram to further evaluate other options.  The patient is aware the risks include but are not limited to: bleeding, infection, thrombosis of the cannulated access, and possible anaphylactic reaction to the contrast.  The patient is aware of the risks of the procedure and elects to proceed forward.  DESCRIPTION: After full informed written consent was obtained, the patient was brought back to the angiography suite and placed supine upon the  angiography table.  The patient was connected to monitoring equipment.  Standard IVs were obtained in bilateral upper extremities.  Initially a left upper extremity venogram was obtained including evaluating the central venous system.  We then performed right upper extremity venogram also evaluating the central venous system the setting of a right IJ tunnel catheter.  Should be noted that contrast was a very sluggish emptying from the left arm and into the central system we could not safely identify contrast emptying into the right atrium from the left system even after multiple injections.  Contrast is much more brisk from the right upper extremity.  There are no usable superficial veins in either upper extremity.  COMPLICATIONS: None  CONDITION: Stable  Plan: Would favor right upper extremity graft.  Both the patient's left upper arm graft and left forearm graft have thrombosed after only 3 weeks and contrast was very sluggish emptying from the left upper extremity suggesting a severe outflow problem.  We did not identify a definitive central stenosis from the left but did have trouble confirming contrast was completely emptying into the right atrium from the left arm suggesting an underlying issue.  Marty Heck, MD [facility] [place] Office: [phone] Pager: [phone]  10/27/2018 8:03 AM

## 2018-10-27 NOTE — Discharge Instructions (Signed)
Venogram, Care After °This sheet gives you information about how to care for yourself after your procedure. Your health care provider may also give you more specific instructions. If you have problems or questions, contact your health care provider. °What can I expect after the procedure? °After the procedure, it is common to have: °· Bruising or mild discomfort in the area where the IV was inserted (insertion site). ° °Follow these instructions at home: °Eating and drinking °· Follow instructions from your health care provider about eating or drinking restrictions. °· Drink a lot of fluids for the first several days after the procedure, as directed by your health care provider. This helps to wash (flush) the contrast out of your body. Examples of healthy fluids include water or low-calorie drinks. °General instructions °· Check your IV insertion area every day for signs of infection. Check for: °? Redness, swelling, or pain. °? Fluid or blood. °? Warmth. °? Pus or a bad smell. °· Take over-the-counter and prescription medicines only as told by your health care provider. °· Rest and return to your normal activities as told by your health care provider. Ask your health care provider what activities are safe for you. °· Do not drive for 24 hours if you were given a medicine to help you relax (sedative), or until your health care provider approves. °· Keep all follow-up visits as told by your health care provider. This is important. °Contact a health care provider if: °· Your skin becomes itchy or you develop a rash or hives. °· You have a fever that does not get better with medicine. °· You feel nauseous. °· You vomit. °· You have redness, swelling, or pain around the insertion site. °· You have fluid or blood coming from the insertion site. °· Your insertion area feels warm to the touch. °· You have pus or a bad smell coming from the insertion site. °Get help right away if: °· You have difficulty breathing or  shortness of breath. °· You develop chest pain. °· You faint. °· You feel very dizzy. °These symptoms may represent a serious problem that is an emergency. Do not wait to see if the symptoms will go away. Get medical help right away. Call your local emergency services (911 in the [place] Do not drive yourself to the hospital. °Summary °· After your procedure, it is common to have bruising or mild discomfort in the area where the IV was inserted. °· You should check your IV insertion area every day for signs of infection. °· Take over-the-counter and prescription medicines only as told by your health care provider. °· You should drink a lot of fluids for the first several days after the procedure to help flush the contrast from your body. °This information is not intended to replace advice given to you by your health care provider. Make sure you discuss any questions you have with your health care provider. °Document Released: 08/30/2013 Document Revised: 10/03/2016 Document Reviewed: 10/03/2016 °Elsevier Interactive Patient Education © 2017 [employer]vier Inc. ° °

## 2018-10-27 NOTE — H&P (Signed)
H&P    History of Present Illness: This is a 67 y.o. female with ESRD and left brach ax graft placed by Dr. Donzetta Matters in early November that presents for fistulogram.  Unfortunately she states graft has had no flow since early week/weekend.  Has RIJ tunneled catheter for dialysis.    Past Medical History:  Diagnosis Date  . Arthritis    hands  . Chronic kidney disease    Stage 4  . Constipation   . Diabetes mellitus without complication ([place])   . GERD (gastroesophageal reflux disease)   . Headache   . Hyperlipidemia   . Hypertension   . Irregular heart rate   . Stroke [facility])    TIA 10 years ago    Past Surgical History:  Procedure Laterality Date  . ABDOMINAL HYSTERECTOMY    . AV FISTULA PLACEMENT Left 06/13/2018   Procedure: ARTERIOVENOUS (AV) FISTULA CREATION;  Surgeon: Rosetta Posner, MD;  Location: [facility] OR;  Service: Vascular;  Laterality: Left;  . AV FISTULA PLACEMENT Left 08/18/2018   Procedure: INSERTION OF 4-7MM X 45CM ARTERIOVENOUS (AV) GORE-TEX GRAFT LEFT  FOREARM;  Surgeon: Rosetta Posner, MD;  Location: [place];  Service: Vascular;  Laterality: Left;  . INSERTION OF DIALYSIS CATHETER N/A 08/18/2018   Procedure: INSERTION OF DIALYSIS CATHETER;  Surgeon: Rosetta Posner, MD;  Location: [place];  Service: Vascular;  Laterality: N/A;  . THROMBECTOMY AND REVISION OF ARTERIOVENTOUS (AV) GORETEX  GRAFT Left 09/29/2018   Procedure: INSERTION OF ARTERIOVENTOUS (AV) GORETEX  GRAFT ARM;  Surgeon: Waynetta Sandy, MD;  Location: [place];  Service: Vascular;  Laterality: Left;    No Known Allergies  Prior to Admission medications   Medication Sig Start Date End Date Taking? Authorizing Provider  amLODipine (NORVASC) 10 MG tablet Take 10 mg by mouth daily.    Yes [provider]  aspirin EC 81 MG tablet Take 81 mg by mouth daily.    Yes [provider]  calcium carbonate (TUMS - DOSED IN MG ELEMENTAL CALCIUM) 500 MG chewable tablet Chew 1 tablet by mouth daily at 12  noon.    Yes [provider]  fluticasone (FLONASE) 50 MCG/ACT nasal spray Place 1 spray into both nostrils daily as needed for allergies or rhinitis.    Yes [provider]  insulin NPH-regular Human (NOVOLIN 70/30) (70-30) 100 UNIT/ML injection Inject 40-45 Units into the skin See admin instructions. Inject 40 units in the morning and 45 units in the evening   Yes [provider]  isosorbide mononitrate (IMDUR) 30 MG 24 hr tablet Take 30 mg by mouth daily.   Yes [provider]  labetalol (NORMODYNE) 100 MG tablet Take 100 mg by mouth 2 (two) times daily.   Yes [provider]  oxyCODONE-acetaminophen (PERCOCET/ROXICET) 5-325 MG tablet Take 1 tablet by mouth every 6 (six) hours as needed. 09/29/18  Yes Ulyses Amor, PA-C  pantoprazole (PROTONIX) 40 MG tablet Take 40 mg by mouth daily.   Yes [provider]  VELTASSA 8.4 g packet Take 8.4 g by mouth daily.  06/01/18  Yes [provider]    Social History   Socioeconomic History  . Marital status: Single    Spouse name: Not on file  . Number of children: Not on file  . Years of education: Not on file  . Highest education level: Not on file  Occupational History  . Not on file  Social Needs  . Financial resource strain: Not  on file  . Food insecurity:    Worry: Not on file    Inability: Not on file  . Transportation needs:    Medical: Not on file    Non-medical: Not on file  Tobacco Use  . Smoking status: Never Smoker  . Smokeless tobacco: Never Used  Substance and Sexual Activity  . Alcohol use: Never    Frequency: Never  . Drug use: Never  . Sexual activity: Not on file  Lifestyle  . Physical activity:    Days per week: Not on file    Minutes per session: Not on file  . Stress: Not on file  Relationships  . Social connections:    Talks on phone: Not on file    Gets together: Not on file    Attends religious service: Not on file    Active member of club or  organization: Not on file    Attends meetings of clubs or organizations: Not on file    Relationship status: Not on file  . Intimate partner violence:    Fear of current or ex partner: Not on file    Emotionally abused: Not on file    Physically abused: Not on file    Forced sexual activity: Not on file  Other Topics Concern  . Not on file  Social History Narrative  . Not on file     Family History  Problem Relation Age of Onset  . Heart murmur Mother   . Heart failure Mother   . Diabetes Mother   . Cirrhosis Father   . Alcoholism Father     ROS: [x]  Positive   [ ]  Negative   [ ]  All sytems reviewed and are negative  Cardiovascular: []  chest pain/pressure []  palpitations []  SOB lying flat []  DOE []  pain in legs while walking []  pain in legs at rest []  pain in legs at night []  non-healing ulcers []  hx of DVT []  swelling in legs  Pulmonary: []  productive cough []  asthma/wheezing []  home O2  Neurologic: []  weakness in []  arms []  legs []  numbness in []  arms []  legs []  hx of CVA []  mini stroke [] difficulty speaking or slurred speech []  temporary loss of vision in one eye []  dizziness  Hematologic: []  hx of cancer []  bleeding problems []  problems with blood clotting easily  Endocrine:   []  diabetes []  thyroid disease  GI []  vomiting blood []  blood in stool  GU: []  CKD/renal failure []  HD--[]  M/W/F or []  T/T/S []  burning with urination []  blood in urine  Psychiatric: []  anxiety []  depression  Musculoskeletal: []  arthritis []  joint pain  Integumentary: []  rashes []  ulcers  Constitutional: []  fever []  chills   Physical Examination  Vitals:   10/27/18 0540  BP: (!) 144/88  Pulse: 89  Resp: 18  Temp: (!) 97.4 F (36.3 C)  SpO2: 93%   Body mass index is 23.96 kg/m.  General:  WDWN in NAD Gait: Not observed HENT: WNL, normocephalic Pulmonary: normal non-labored breathing, without Rales, rhonchi,  wheezing Cardiac: regular,  without  Murmurs, rubs or gallops;  Abdomen: soft, NT/ND, no masses Vascular Exam/Pulses: Palpable left radial pulse, thrombosed left forearm loop graft and upper arm graft Extremities: No ischemic changes to left hand  CBC    Component Value Date/Time   HGB 11.2 (L) 10/27/2018 0619   HCT 33.0 (L) 10/27/2018 0619    BMET    Component Value Date/Time   NA 138 10/27/2018 0619   K 4.0  10/27/2018 0619   CL 107 10/27/2018 0619   GLUCOSE 160 (H) 10/27/2018 0619   BUN 19 10/27/2018 0619   CREATININE 3.70 (H) 10/27/2018 0619    COAGS: No results found for: INR, PROTIME   Non-Invasive Vascular Imaging:   None   ASSESSMENT/PLAN: This is a 67 y.o. female with thrombosed left brach ax graft placed about 3 weeks ago by Dr. Donzetta Matters.  Plan for fistulogram today due to poor flow.  Unfortunately graft thrombosed and has been down since early week/weekend.  Will plan for bilateral upper extremity venogram given multiple graft failures in left arm and makes plans moving forward.   Marty Heck, MD [facility] [place] Office: [phone] Pager: [place]

## 2018-10-28 ENCOUNTER — Other Ambulatory Visit: Payer: Self-pay | Admitting: *Deleted

## 2018-10-28 DIAGNOSIS — Z992 Dependence on renal dialysis: Secondary | ICD-10-CM | POA: Diagnosis not present

## 2018-10-28 DIAGNOSIS — Z23 Encounter for immunization: Secondary | ICD-10-CM | POA: Diagnosis not present

## 2018-10-28 DIAGNOSIS — N186 End stage renal disease: Secondary | ICD-10-CM | POA: Diagnosis not present

## 2018-10-31 DIAGNOSIS — N186 End stage renal disease: Secondary | ICD-10-CM | POA: Diagnosis not present

## 2018-10-31 DIAGNOSIS — Z992 Dependence on renal dialysis: Secondary | ICD-10-CM | POA: Diagnosis not present

## 2018-10-31 DIAGNOSIS — Z23 Encounter for immunization: Secondary | ICD-10-CM | POA: Diagnosis not present

## 2018-11-02 DIAGNOSIS — Z992 Dependence on renal dialysis: Secondary | ICD-10-CM | POA: Diagnosis not present

## 2018-11-02 DIAGNOSIS — N186 End stage renal disease: Secondary | ICD-10-CM | POA: Diagnosis not present

## 2018-11-02 DIAGNOSIS — Z23 Encounter for immunization: Secondary | ICD-10-CM | POA: Diagnosis not present

## 2018-11-04 DIAGNOSIS — N186 End stage renal disease: Secondary | ICD-10-CM | POA: Diagnosis not present

## 2018-11-04 DIAGNOSIS — Z992 Dependence on renal dialysis: Secondary | ICD-10-CM | POA: Diagnosis not present

## 2018-11-04 DIAGNOSIS — Z23 Encounter for immunization: Secondary | ICD-10-CM | POA: Diagnosis not present

## 2018-11-07 ENCOUNTER — Other Ambulatory Visit: Payer: Self-pay | Admitting: *Deleted

## 2018-11-07 ENCOUNTER — Other Ambulatory Visit: Payer: Self-pay

## 2018-11-07 DIAGNOSIS — Z992 Dependence on renal dialysis: Secondary | ICD-10-CM | POA: Diagnosis not present

## 2018-11-07 DIAGNOSIS — Z23 Encounter for immunization: Secondary | ICD-10-CM | POA: Diagnosis not present

## 2018-11-07 DIAGNOSIS — N186 End stage renal disease: Secondary | ICD-10-CM | POA: Diagnosis not present

## 2018-11-09 DIAGNOSIS — Z23 Encounter for immunization: Secondary | ICD-10-CM | POA: Diagnosis not present

## 2018-11-09 DIAGNOSIS — Z992 Dependence on renal dialysis: Secondary | ICD-10-CM | POA: Diagnosis not present

## 2018-11-09 DIAGNOSIS — N186 End stage renal disease: Secondary | ICD-10-CM | POA: Diagnosis not present

## 2018-11-11 DIAGNOSIS — N186 End stage renal disease: Secondary | ICD-10-CM | POA: Diagnosis not present

## 2018-11-11 DIAGNOSIS — Z992 Dependence on renal dialysis: Secondary | ICD-10-CM | POA: Diagnosis not present

## 2018-11-11 DIAGNOSIS — Z23 Encounter for immunization: Secondary | ICD-10-CM | POA: Diagnosis not present

## 2018-11-13 DIAGNOSIS — Z992 Dependence on renal dialysis: Secondary | ICD-10-CM | POA: Diagnosis not present

## 2018-11-13 DIAGNOSIS — Z23 Encounter for immunization: Secondary | ICD-10-CM | POA: Diagnosis not present

## 2018-11-13 DIAGNOSIS — N186 End stage renal disease: Secondary | ICD-10-CM | POA: Diagnosis not present

## 2018-11-14 ENCOUNTER — Telehealth: Payer: Self-pay

## 2018-11-14 NOTE — Telephone Encounter (Signed)
Spoke to patient to confirm surgery date/time. Informed her on where to go and to remain NPO after midnight with a high protein snack the night before surgery. Pt. Verbalized understanding and had no further questions at this time.

## 2018-11-15 DIAGNOSIS — Z992 Dependence on renal dialysis: Secondary | ICD-10-CM | POA: Diagnosis not present

## 2018-11-15 DIAGNOSIS — N186 End stage renal disease: Secondary | ICD-10-CM | POA: Diagnosis not present

## 2018-11-15 DIAGNOSIS — Z23 Encounter for immunization: Secondary | ICD-10-CM | POA: Diagnosis not present

## 2018-11-18 DIAGNOSIS — E119 Type 2 diabetes mellitus without complications: Secondary | ICD-10-CM | POA: Diagnosis not present

## 2018-11-18 DIAGNOSIS — Z794 Long term (current) use of insulin: Secondary | ICD-10-CM | POA: Diagnosis not present

## 2018-11-18 DIAGNOSIS — Z992 Dependence on renal dialysis: Secondary | ICD-10-CM | POA: Diagnosis not present

## 2018-11-18 DIAGNOSIS — Z23 Encounter for immunization: Secondary | ICD-10-CM | POA: Diagnosis not present

## 2018-11-18 DIAGNOSIS — N186 End stage renal disease: Secondary | ICD-10-CM | POA: Diagnosis not present

## 2018-11-21 DIAGNOSIS — Z23 Encounter for immunization: Secondary | ICD-10-CM | POA: Diagnosis not present

## 2018-11-21 DIAGNOSIS — N186 End stage renal disease: Secondary | ICD-10-CM | POA: Diagnosis not present

## 2018-11-21 DIAGNOSIS — Z992 Dependence on renal dialysis: Secondary | ICD-10-CM | POA: Diagnosis not present

## 2018-11-22 DIAGNOSIS — N186 End stage renal disease: Secondary | ICD-10-CM | POA: Diagnosis not present

## 2018-11-22 DIAGNOSIS — Z992 Dependence on renal dialysis: Secondary | ICD-10-CM | POA: Diagnosis not present

## 2018-11-23 DIAGNOSIS — Z992 Dependence on renal dialysis: Secondary | ICD-10-CM | POA: Diagnosis not present

## 2018-11-23 DIAGNOSIS — N186 End stage renal disease: Secondary | ICD-10-CM | POA: Diagnosis not present

## 2018-11-25 DIAGNOSIS — N186 End stage renal disease: Secondary | ICD-10-CM | POA: Diagnosis not present

## 2018-11-25 DIAGNOSIS — Z992 Dependence on renal dialysis: Secondary | ICD-10-CM | POA: Diagnosis not present

## 2018-11-28 DIAGNOSIS — N186 End stage renal disease: Secondary | ICD-10-CM | POA: Diagnosis not present

## 2018-11-28 DIAGNOSIS — Z992 Dependence on renal dialysis: Secondary | ICD-10-CM | POA: Diagnosis not present

## 2018-11-29 ENCOUNTER — Other Ambulatory Visit: Payer: Self-pay | Admitting: *Deleted

## 2018-11-29 ENCOUNTER — Encounter (HOSPITAL_COMMUNITY): Payer: Self-pay | Admitting: *Deleted

## 2018-11-30 DIAGNOSIS — Z992 Dependence on renal dialysis: Secondary | ICD-10-CM | POA: Diagnosis not present

## 2018-11-30 DIAGNOSIS — N186 End stage renal disease: Secondary | ICD-10-CM | POA: Diagnosis not present

## 2018-12-02 ENCOUNTER — Other Ambulatory Visit (HOSPITAL_COMMUNITY): Payer: Self-pay | Admitting: Nephrology

## 2018-12-02 DIAGNOSIS — Z992 Dependence on renal dialysis: Secondary | ICD-10-CM | POA: Diagnosis not present

## 2018-12-02 DIAGNOSIS — N186 End stage renal disease: Secondary | ICD-10-CM | POA: Diagnosis not present

## 2018-12-05 ENCOUNTER — Other Ambulatory Visit: Payer: Self-pay | Admitting: Radiology

## 2018-12-05 DIAGNOSIS — N186 End stage renal disease: Secondary | ICD-10-CM | POA: Diagnosis not present

## 2018-12-05 DIAGNOSIS — Z992 Dependence on renal dialysis: Secondary | ICD-10-CM | POA: Diagnosis not present

## 2018-12-05 MED ORDER — DEXTROSE 5 % IV SOLN: 2.0000 g | INTRAVENOUS | Status: AC

## 2018-12-05 MED ORDER — SODIUM CHLORIDE 0.9 % IV SOLN
INTRAVENOUS | Status: DC
  Administered 2019-05-16: 12:00:00 via INTRAVENOUS

## 2018-12-06 ENCOUNTER — Ambulatory Visit (HOSPITAL_COMMUNITY)
Admission: RE | Admit: 2018-12-06 | Discharge: 2018-12-06 | Disposition: A | Discharge status: Home / Self Care | Payer: Medicare PPO | Source: Ambulatory Visit | Attending: Nephrology | Admitting: Nephrology

## 2018-12-06 ENCOUNTER — Encounter (HOSPITAL_COMMUNITY): Payer: Self-pay

## 2018-12-06 ENCOUNTER — Other Ambulatory Visit (HOSPITAL_COMMUNITY): Payer: Self-pay | Admitting: Nephrology

## 2018-12-06 ENCOUNTER — Other Ambulatory Visit: Payer: Self-pay

## 2018-12-06 DIAGNOSIS — K219 Gastro-esophageal reflux disease without esophagitis: Secondary | ICD-10-CM | POA: Diagnosis not present

## 2018-12-06 DIAGNOSIS — E1122 Type 2 diabetes mellitus with diabetic chronic kidney disease: Secondary | ICD-10-CM | POA: Diagnosis not present

## 2018-12-06 DIAGNOSIS — Z79899 Other long term (current) drug therapy: Secondary | ICD-10-CM | POA: Insufficient documentation

## 2018-12-06 DIAGNOSIS — Z794 Long term (current) use of insulin: Secondary | ICD-10-CM | POA: Insufficient documentation

## 2018-12-06 DIAGNOSIS — N186 End stage renal disease: Secondary | ICD-10-CM | POA: Diagnosis not present

## 2018-12-06 DIAGNOSIS — Y832 Surgical operation with anastomosis, bypass or graft as the cause of abnormal reaction of the patient, or of later complication, without mention of misadventure at the time of the procedure: Secondary | ICD-10-CM | POA: Insufficient documentation

## 2018-12-06 DIAGNOSIS — T8241XA Breakdown (mechanical) of vascular dialysis catheter, initial encounter: Secondary | ICD-10-CM | POA: Insufficient documentation

## 2018-12-06 DIAGNOSIS — Z992 Dependence on renal dialysis: Secondary | ICD-10-CM

## 2018-12-06 DIAGNOSIS — I120 Hypertensive chronic kidney disease with stage 5 chronic kidney disease or end stage renal disease: Secondary | ICD-10-CM | POA: Diagnosis not present

## 2018-12-06 DIAGNOSIS — E785 Hyperlipidemia, unspecified: Secondary | ICD-10-CM | POA: Diagnosis not present

## 2018-12-06 DIAGNOSIS — T82898A Other specified complication of vascular prosthetic devices, implants and grafts, initial encounter: Secondary | ICD-10-CM | POA: Diagnosis not present

## 2018-12-06 DIAGNOSIS — Z8673 Personal history of transient ischemic attack (TIA), and cerebral infarction without residual deficits: Secondary | ICD-10-CM | POA: Diagnosis not present

## 2018-12-06 HISTORY — PX: IR FLUORO GUIDE CV LINE RIGHT: IMG2283

## 2018-12-06 HISTORY — PX: IR PTA ADDL CENTRAL DIALYSIS SEG THRU DIALY CIRCUIT RIGHT: IMG6121

## 2018-12-06 LAB — GLUCOSE, CAPILLARY: GLUCOSE-CAPILLARY: 135 mg/dL — AB (ref 70–99)

## 2018-12-06 MED ORDER — LIDOCAINE HCL 1 % IJ SOLN
INTRAMUSCULAR | Status: AC
  Filled 2018-12-06: qty 20

## 2018-12-06 MED ORDER — IOPAMIDOL (ISOVUE-300) INJECTION 61%
INTRAVENOUS | Status: AC
  Administered 2018-12-06: 5 mL
  Filled 2018-12-06: qty 50

## 2018-12-06 MED ORDER — HEPARIN SODIUM (PORCINE) 1000 UNIT/ML IJ SOLN
INTRAMUSCULAR | Status: AC
  Administered 2018-12-06: 3800 [IU]
  Filled 2018-12-06: qty 1

## 2018-12-06 MED ORDER — SODIUM CHLORIDE 0.9 % IV SOLN: INTRAVENOUS | Status: DC

## 2018-12-06 MED ORDER — CEFAZOLIN SODIUM-DEXTROSE 2-4 GM/100ML-% IV SOLN
INTRAVENOUS | Status: AC
  Filled 2018-12-06: qty 100

## 2018-12-06 MED ORDER — CHLORHEXIDINE GLUCONATE 4 % EX LIQD
CUTANEOUS | Status: AC
  Filled 2018-12-06: qty 15

## 2018-12-06 MED ORDER — LIDOCAINE HCL (PF) 1 % IJ SOLN
INTRAMUSCULAR | Status: AC | PRN
  Administered 2018-12-06: 10 mL

## 2018-12-06 MED ORDER — CEFAZOLIN SODIUM-DEXTROSE 2-4 GM/100ML-% IV SOLN
2.0000 g | INTRAVENOUS | Status: AC
  Administered 2018-12-06: 2 g via INTRAVENOUS

## 2018-12-06 NOTE — Procedures (Signed)
  Procedure: Exchange R IJ tunneled HD cath, Venous PTA   EBL:   minimal Complications:  none immediate  See full dictation in [facility].  Dillard Cannon MD Main # [phone] Pager  [phone]

## 2018-12-06 NOTE — H&P (Signed)
Chief Complaint: Patient was seen in consultation today for exchange of tunneled HD catheter at the request of [place]  Referring Physician(s): Fran Lowes  Supervising Physician: Arne Cleveland  Patient Status: [facility] - Out-pt  History of Present Illness: [name] is a 68 y.o. female with ESRD. She gets HD on M-W-F via (R)IJ TDC that was placed by [facility] in 9/19. She has also had multiple (L)UE AVF/AVG that have failed and therefore has had to continue using the [facility]. Recently she reports some issues with flow pressures during treatment. She is referred for image guided exchange. PMHx, meds, labs, imaging, allergies reviewed. Feels well, no recent fevers, chills, illness. Has been NPO today as directed. Family at bedside.   Past Medical History:  Diagnosis Date  . Arthritis    hands  . Chronic kidney disease    Stage 5 Dialysis M/W/F  . Constipation   . Diabetes mellitus without complication ([place])   . GERD (gastroesophageal reflux disease)   . Headache   . Hyperlipidemia   . Hypertension   . Irregular heart rate   . Stroke [facility])    TIA 10 years ago    Past Surgical History:  Procedure Laterality Date  . ABDOMINAL HYSTERECTOMY    . AV FISTULA PLACEMENT Left 06/13/2018   Procedure: ARTERIOVENOUS (AV) FISTULA CREATION;  Surgeon: Rosetta Posner, MD;  Location: [facility] OR;  Service: Vascular;  Laterality: Left;  . AV FISTULA PLACEMENT Left 08/18/2018   Procedure: INSERTION OF 4-7MM X 45CM ARTERIOVENOUS (AV) GORE-TEX GRAFT LEFT  FOREARM;  Surgeon: Rosetta Posner, MD;  Location: [place];  Service: Vascular;  Laterality: Left;  . INSERTION OF DIALYSIS CATHETER N/A 08/18/2018   Procedure: INSERTION OF DIALYSIS CATHETER;  Surgeon: Rosetta Posner, MD;  Location: [place];  Service: Vascular;  Laterality: N/A;  . THROMBECTOMY AND REVISION OF ARTERIOVENTOUS (AV) GORETEX  GRAFT Left 09/29/2018   Procedure: INSERTION OF ARTERIOVENTOUS (AV) GORETEX  GRAFT ARM;  Surgeon: Waynetta Sandy, MD;  Location: [place];  Service: Vascular;  Laterality: Left;  Marland Kitchen VENOGRAM  10/27/2018   Procedure: Venogram;  Surgeon: Marty Heck, MD;  Location: [place] [facility];  Service: Cardiovascular;;  bilateral arm    Allergies: Patient has no known allergies.  Medications: Prior to Admission medications   Medication Sig Start Date End Date Taking? Authorizing Provider  amLODipine (NORVASC) 10 MG tablet Take 10 mg by mouth daily.     [provider]  aspirin EC 81 MG tablet Take 81 mg by mouth daily.     [provider]  calcium carbonate (TUMS - DOSED IN MG ELEMENTAL CALCIUM) 500 MG chewable tablet Chew 1 tablet by mouth daily at 12 noon.     [provider]  fluticasone (FLONASE) 50 MCG/ACT nasal spray Place 1 spray into both nostrils daily as needed for allergies or rhinitis.     [provider]  insulin NPH-regular Human (NOVOLIN 70/30) (70-30) 100 UNIT/ML injection Inject 40-45 Units into the skin See admin instructions. Inject 40 units in the morning and 45 units in the evening    [provider]  isosorbide mononitrate (IMDUR) 30 MG 24 hr tablet Take 30 mg by mouth daily.    [provider]  labetalol (NORMODYNE) 100 MG tablet Take 100 mg by mouth 2 (two) times daily.    [provider]  oxyCODONE-acetaminophen (PERCOCET/ROXICET) 5-325 MG tablet Take 1 tablet by mouth every 6 (six) hours as needed. 09/29/18  Laurence Slate M, PA-C  pantoprazole (PROTONIX) 40 MG tablet Take 40 mg by mouth daily.    [provider]  VELTASSA 8.4 g packet Take 8.4 g by mouth daily.  06/01/18   [provider]     Family History  Problem Relation Age of Onset  . Heart murmur Mother   . Heart failure Mother   . Diabetes Mother   . Cirrhosis Father   . Alcoholism Father     Social History   Socioeconomic History  . Marital status: Single    Spouse name: Not on file  . Number of children: Not on  file  . Years of education: Not on file  . Highest education level: Not on file  Occupational History  . Not on file  Social Needs  . Financial resource strain: Not on file  . Food insecurity:    Worry: Not on file    Inability: Not on file  . Transportation needs:    Medical: Not on file    Non-medical: Not on file  Tobacco Use  . Smoking status: Never Smoker  . Smokeless tobacco: Never Used  Substance and Sexual Activity  . Alcohol use: Never    Frequency: Never  . Drug use: Never  . Sexual activity: Not on file  Lifestyle  . Physical activity:    Days per week: Not on file    Minutes per session: Not on file  . Stress: Not on file  Relationships  . Social connections:    Talks on phone: Not on file    Gets together: Not on file    Attends religious service: Not on file    Active member of club or organization: Not on file    Attends meetings of clubs or organizations: Not on file    Relationship status: Not on file  Other Topics Concern  . Not on file  Social History Narrative  . Not on file     Review of Systems: A 12 point ROS discussed and pertinent positives are indicated in the HPI above.  All other systems are negative.  Review of Systems  Vital Signs: BP (!) 144/88   Pulse 91   Temp 98.3 F (36.8 C)   Resp 18   Ht 5\' 7"  (1.702 m)   Wt 71.2 kg   SpO2 99%   BMI 24.59 kg/m   Physical Exam Constitutional:      Appearance: Normal appearance.  HENT:     Mouth/Throat:     Mouth: Mucous membranes are moist.     Pharynx: Oropharynx is clear.  Neck:     Comments: (R)IJ TDC intact, site clean Cardiovascular:     Rate and Rhythm: Normal rate and regular rhythm.     Heart sounds: Normal heart sounds.  Pulmonary:     Effort: Pulmonary effort is normal. No respiratory distress.     Breath sounds: Normal breath sounds.  Skin:    General: Skin is warm and dry.  Neurological:     General: No focal deficit present.     Mental Status: She is alert and  oriented to person, place, and time.  Psychiatric:        Mood and Affect: Mood normal.        Judgment: Judgment normal.     Imaging: No results found.  Labs:  CBC: Recent Labs    06/13/18 0742 08/18/18 0821 09/29/18 0825 10/27/18 0619  HGB 8.5* 9.2* 10.2* 11.2*  HCT 25.0* 27.0*  30.0* 33.0*    COAGS: No results for input(s): INR, APTT in the last 8760 hours.  BMP: Recent Labs    06/13/18 0742 08/18/18 0821 09/29/18 0825 10/27/18 0619  NA 140 142 143 138  K 4.8 5.0 3.3* 4.0  CL  --   --   --  107  GLUCOSE 138* 87 66* 160*  BUN  --   --   --  19  CREATININE  --   --   --  3.70*    Assessment and Plan: ESRD Dysfunctional (R)IJ TDC Plan for exchange. D/w pt that sedation not usually necessary, but since she has been NPO and she has a ride, we could offer some sedation if needed. Risks and benefits of catheter exchange was discussed with the patient including, but not limited to bleeding, infection, fibrin sheath development and need for additional procedures.  All of the patient's questions were answered, patient is agreeable to proceed. Consent signed and in chart.     Thank you for this interesting consult.  I greatly enjoyed meeting [name] and look forward to participating in their care.  A copy of this report was sent to the requesting provider on this date.  Electronically Signed: Ascencion Dike, PA-C 12/06/2018, 10:39 AM   I spent a total of 20 minutes in face to face in clinical consultation, greater than 50% of which was counseling/coordinating care for Permcath exchange

## 2018-12-07 DIAGNOSIS — N186 End stage renal disease: Secondary | ICD-10-CM | POA: Diagnosis not present

## 2018-12-07 DIAGNOSIS — Z992 Dependence on renal dialysis: Secondary | ICD-10-CM | POA: Diagnosis not present

## 2018-12-08 ENCOUNTER — Encounter (HOSPITAL_COMMUNITY): Payer: Self-pay

## 2018-12-09 DIAGNOSIS — Z992 Dependence on renal dialysis: Secondary | ICD-10-CM | POA: Diagnosis not present

## 2018-12-09 DIAGNOSIS — N186 End stage renal disease: Secondary | ICD-10-CM | POA: Diagnosis not present

## 2018-12-12 DIAGNOSIS — N186 End stage renal disease: Secondary | ICD-10-CM | POA: Diagnosis not present

## 2018-12-12 DIAGNOSIS — Z992 Dependence on renal dialysis: Secondary | ICD-10-CM | POA: Diagnosis not present

## 2018-12-14 ENCOUNTER — Telehealth: Payer: Self-pay | Admitting: *Deleted

## 2018-12-14 DIAGNOSIS — Z992 Dependence on renal dialysis: Secondary | ICD-10-CM | POA: Diagnosis not present

## 2018-12-14 DIAGNOSIS — N186 End stage renal disease: Secondary | ICD-10-CM | POA: Diagnosis not present

## 2018-12-14 NOTE — Telephone Encounter (Signed)
Received a call from patient to cancel her RUE AVG placement scheduled for tomorrow with Dr. Carlis Abbott. Patient left message that she wanted to cancel due to "having BP taken in right arm is only option". When I tried to call her back at [phone], she did not answer and had no voicemail that was set up. I then called her HD center [facility] [phone]) and spoke to [place] who said the patient told her something about not being able to take future BP readings in her right arm. [name] told patient to discuss with Dr. Lowanda Foster. This is the 5th time this patient has canceled this surgery; I will place her paperwork in my pending file.

## 2018-12-15 ENCOUNTER — Encounter (HOSPITAL_COMMUNITY): Admission: RE | Payer: Self-pay | Source: Home / Self Care

## 2018-12-15 ENCOUNTER — Ambulatory Visit (HOSPITAL_COMMUNITY): Admission: RE | Admit: 2018-12-15 | Payer: Medicare PPO | Source: Home / Self Care | Admitting: Vascular Surgery

## 2018-12-15 SURGERY — INSERTION OF ARTERIOVENOUS (AV) GORE-TEX GRAFT ARM
Anesthesia: Choice | Laterality: Right

## 2018-12-16 DIAGNOSIS — Z992 Dependence on renal dialysis: Secondary | ICD-10-CM | POA: Diagnosis not present

## 2018-12-16 DIAGNOSIS — N186 End stage renal disease: Secondary | ICD-10-CM | POA: Diagnosis not present

## 2018-12-19 DIAGNOSIS — Z992 Dependence on renal dialysis: Secondary | ICD-10-CM | POA: Diagnosis not present

## 2018-12-19 DIAGNOSIS — N186 End stage renal disease: Secondary | ICD-10-CM | POA: Diagnosis not present

## 2018-12-21 DIAGNOSIS — N186 End stage renal disease: Secondary | ICD-10-CM | POA: Diagnosis not present

## 2018-12-21 DIAGNOSIS — Z992 Dependence on renal dialysis: Secondary | ICD-10-CM | POA: Diagnosis not present

## 2018-12-23 DIAGNOSIS — Z992 Dependence on renal dialysis: Secondary | ICD-10-CM | POA: Diagnosis not present

## 2018-12-23 DIAGNOSIS — N186 End stage renal disease: Secondary | ICD-10-CM | POA: Diagnosis not present

## 2018-12-26 DIAGNOSIS — Z992 Dependence on renal dialysis: Secondary | ICD-10-CM | POA: Diagnosis not present

## 2018-12-26 DIAGNOSIS — N186 End stage renal disease: Secondary | ICD-10-CM | POA: Diagnosis not present

## 2018-12-28 DIAGNOSIS — N186 End stage renal disease: Secondary | ICD-10-CM | POA: Diagnosis not present

## 2018-12-28 DIAGNOSIS — Z992 Dependence on renal dialysis: Secondary | ICD-10-CM | POA: Diagnosis not present

## 2018-12-30 DIAGNOSIS — N186 End stage renal disease: Secondary | ICD-10-CM | POA: Diagnosis not present

## 2018-12-30 DIAGNOSIS — Z992 Dependence on renal dialysis: Secondary | ICD-10-CM | POA: Diagnosis not present

## 2019-01-02 DIAGNOSIS — Z992 Dependence on renal dialysis: Secondary | ICD-10-CM | POA: Diagnosis not present

## 2019-01-02 DIAGNOSIS — N186 End stage renal disease: Secondary | ICD-10-CM | POA: Diagnosis not present

## 2019-01-04 DIAGNOSIS — N186 End stage renal disease: Secondary | ICD-10-CM | POA: Diagnosis not present

## 2019-01-04 DIAGNOSIS — Z992 Dependence on renal dialysis: Secondary | ICD-10-CM | POA: Diagnosis not present

## 2019-01-06 DIAGNOSIS — Z992 Dependence on renal dialysis: Secondary | ICD-10-CM | POA: Diagnosis not present

## 2019-01-06 DIAGNOSIS — N186 End stage renal disease: Secondary | ICD-10-CM | POA: Diagnosis not present

## 2019-01-09 DIAGNOSIS — N186 End stage renal disease: Secondary | ICD-10-CM | POA: Diagnosis not present

## 2019-01-09 DIAGNOSIS — Z992 Dependence on renal dialysis: Secondary | ICD-10-CM | POA: Diagnosis not present

## 2019-01-11 DIAGNOSIS — Z992 Dependence on renal dialysis: Secondary | ICD-10-CM | POA: Diagnosis not present

## 2019-01-11 DIAGNOSIS — N186 End stage renal disease: Secondary | ICD-10-CM | POA: Diagnosis not present

## 2019-01-13 DIAGNOSIS — N186 End stage renal disease: Secondary | ICD-10-CM | POA: Diagnosis not present

## 2019-01-13 DIAGNOSIS — Z992 Dependence on renal dialysis: Secondary | ICD-10-CM | POA: Diagnosis not present

## 2019-01-16 DIAGNOSIS — Z992 Dependence on renal dialysis: Secondary | ICD-10-CM | POA: Diagnosis not present

## 2019-01-16 DIAGNOSIS — N186 End stage renal disease: Secondary | ICD-10-CM | POA: Diagnosis not present

## 2019-01-18 DIAGNOSIS — Z992 Dependence on renal dialysis: Secondary | ICD-10-CM | POA: Diagnosis not present

## 2019-01-18 DIAGNOSIS — N186 End stage renal disease: Secondary | ICD-10-CM | POA: Diagnosis not present

## 2019-01-20 DIAGNOSIS — N186 End stage renal disease: Secondary | ICD-10-CM | POA: Diagnosis not present

## 2019-01-20 DIAGNOSIS — Z992 Dependence on renal dialysis: Secondary | ICD-10-CM | POA: Diagnosis not present

## 2019-01-21 DIAGNOSIS — Z992 Dependence on renal dialysis: Secondary | ICD-10-CM | POA: Diagnosis not present

## 2019-01-21 DIAGNOSIS — N186 End stage renal disease: Secondary | ICD-10-CM | POA: Diagnosis not present

## 2019-01-23 DIAGNOSIS — N186 End stage renal disease: Secondary | ICD-10-CM | POA: Diagnosis not present

## 2019-01-23 DIAGNOSIS — Z992 Dependence on renal dialysis: Secondary | ICD-10-CM | POA: Diagnosis not present

## 2019-01-24 ENCOUNTER — Encounter: Payer: Self-pay | Admitting: Vascular Surgery

## 2019-01-25 DIAGNOSIS — Z992 Dependence on renal dialysis: Secondary | ICD-10-CM | POA: Diagnosis not present

## 2019-01-25 DIAGNOSIS — N186 End stage renal disease: Secondary | ICD-10-CM | POA: Diagnosis not present

## 2019-01-27 DIAGNOSIS — Z992 Dependence on renal dialysis: Secondary | ICD-10-CM | POA: Diagnosis not present

## 2019-01-27 DIAGNOSIS — N186 End stage renal disease: Secondary | ICD-10-CM | POA: Diagnosis not present

## 2019-01-30 DIAGNOSIS — N186 End stage renal disease: Secondary | ICD-10-CM | POA: Diagnosis not present

## 2019-01-30 DIAGNOSIS — Z992 Dependence on renal dialysis: Secondary | ICD-10-CM | POA: Diagnosis not present

## 2019-02-01 DIAGNOSIS — N186 End stage renal disease: Secondary | ICD-10-CM | POA: Diagnosis not present

## 2019-02-01 DIAGNOSIS — Z992 Dependence on renal dialysis: Secondary | ICD-10-CM | POA: Diagnosis not present

## 2019-02-03 DIAGNOSIS — Z992 Dependence on renal dialysis: Secondary | ICD-10-CM | POA: Diagnosis not present

## 2019-02-03 DIAGNOSIS — N186 End stage renal disease: Secondary | ICD-10-CM | POA: Diagnosis not present

## 2019-02-06 DIAGNOSIS — N186 End stage renal disease: Secondary | ICD-10-CM | POA: Diagnosis not present

## 2019-02-06 DIAGNOSIS — Z992 Dependence on renal dialysis: Secondary | ICD-10-CM | POA: Diagnosis not present

## 2019-02-08 DIAGNOSIS — N186 End stage renal disease: Secondary | ICD-10-CM | POA: Diagnosis not present

## 2019-02-08 DIAGNOSIS — Z992 Dependence on renal dialysis: Secondary | ICD-10-CM | POA: Diagnosis not present

## 2019-02-10 DIAGNOSIS — N186 End stage renal disease: Secondary | ICD-10-CM | POA: Diagnosis not present

## 2019-02-10 DIAGNOSIS — Z992 Dependence on renal dialysis: Secondary | ICD-10-CM | POA: Diagnosis not present

## 2019-02-13 DIAGNOSIS — N186 End stage renal disease: Secondary | ICD-10-CM | POA: Diagnosis not present

## 2019-02-13 DIAGNOSIS — Z992 Dependence on renal dialysis: Secondary | ICD-10-CM | POA: Diagnosis not present

## 2019-02-13 DIAGNOSIS — E119 Type 2 diabetes mellitus without complications: Secondary | ICD-10-CM | POA: Diagnosis not present

## 2019-02-13 DIAGNOSIS — Z794 Long term (current) use of insulin: Secondary | ICD-10-CM | POA: Diagnosis not present

## 2019-02-15 DIAGNOSIS — N186 End stage renal disease: Secondary | ICD-10-CM | POA: Diagnosis not present

## 2019-02-15 DIAGNOSIS — Z992 Dependence on renal dialysis: Secondary | ICD-10-CM | POA: Diagnosis not present

## 2019-02-17 DIAGNOSIS — Z992 Dependence on renal dialysis: Secondary | ICD-10-CM | POA: Diagnosis not present

## 2019-02-17 DIAGNOSIS — N186 End stage renal disease: Secondary | ICD-10-CM | POA: Diagnosis not present

## 2019-02-20 ENCOUNTER — Other Ambulatory Visit: Payer: Self-pay

## 2019-02-20 ENCOUNTER — Encounter: Payer: Medicare PPO | Admitting: Family

## 2019-02-20 ENCOUNTER — Encounter (HOSPITAL_COMMUNITY): Payer: Medicare PPO

## 2019-02-20 ENCOUNTER — Ambulatory Visit (HOSPITAL_COMMUNITY)
Admission: RE | Admit: 2019-02-20 | Discharge: 2019-02-20 | Disposition: A | Discharge status: Home / Self Care | Payer: Medicare PPO | Source: Ambulatory Visit | Attending: Family | Admitting: Family

## 2019-02-20 ENCOUNTER — Ambulatory Visit: Payer: Medicare PPO | Admitting: Family

## 2019-02-20 ENCOUNTER — Encounter (HOSPITAL_COMMUNITY): Payer: Self-pay

## 2019-02-20 ENCOUNTER — Encounter: Payer: Self-pay | Admitting: Family

## 2019-02-20 VITALS — BP 161/100 | HR 104 | Temp 98.2°F | Resp 14 | Ht 67.0 in | Wt 155.0 lb

## 2019-02-20 DIAGNOSIS — N186 End stage renal disease: Secondary | ICD-10-CM

## 2019-02-20 DIAGNOSIS — N185 Chronic kidney disease, stage 5: Secondary | ICD-10-CM

## 2019-02-20 DIAGNOSIS — T82868A Thrombosis of vascular prosthetic devices, implants and grafts, initial encounter: Secondary | ICD-10-CM | POA: Diagnosis not present

## 2019-02-20 DIAGNOSIS — Z992 Dependence on renal dialysis: Secondary | ICD-10-CM | POA: Diagnosis not present

## 2019-02-20 NOTE — Progress Notes (Signed)
CC: need for blood pressures to be checked in left arm, s/p thrombosed AV grafts in left forearm and left upper arm, legs are very painful with blood pressure checks in legs, had TDC in place    History of Present Illness  [name] is a 68 y.o. (October 23, 1951) female who is s/p bilateral upper extremity venogram on 10-27-18 by Dr. Carlis Abbott for thrombosed left brachial artery to axillary vein AV graft.  FINDING(S): 1. The left upper extremity brach ax graft is thrombosed.  Contrast in the left upper extremity was very sluggish to empty from the left arm and became very stagnant in the central venous system.  Had difficulty identifying contrast emptying into the right atrium definitively although no overt stenosis was identified once contrast reached the axillary vein and further centrally.  There were no superficial usable veins in the left arm. 2. The right upper extremity venous system emptied much more briskly even in the setting of a right IJ tunneled catheter.  There was no evidence of central stenosis.  The brachial veins were stenosed in the mid upper arm but the proximal brachial vein and axillary vein were widely patent and no overt other central stenosis.  Plan: Would favor right upper extremity graft.  Both the patient's left upper arm graft and left forearm graft have thrombosed after only 3 weeks and contrast was very sluggish emptying from the left upper extremity suggesting a severe outflow problem.  We did not identify a definitive central stenosis from the left but did have trouble confirming contrast was completely emptying into the right atrium from the left arm suggesting an underlying issue.   Before the venogram she was s/p Left arm brachial to axillary AV graft with 4-7 mm PTFE on 09-29-18 by Dr. Donzetta Matters. She had previously and recently underwent placement of a left arm AV graft and a forearm loop configuration.  That had subsequent thrombosed.  On 12-14-18, triage nurse for [facility],  Diana Flake RN, received a call from patient to cancel her RUE AVG placement scheduled for the next day with Dr. Carlis Abbott. Patient left message that she wanted to cancel due to "having BP taken in right arm is only option". When nurse tried to call her back at [phone], she did not answer and had no voicemail that was set up. Zigmund Daniel then called her HD center [facility] [place] Rom[facility] [phone]) and spoke to [place] who said the patient told her something about not being able to take future BP readings in her right arm. [name] told patient to discuss with Dr. Lowanda Foster. This is the 5th time this patient has canceled this surgery;  her paperwork to be placed in pending file.  She dialyzes MWF via [place] at right side of chest, at [facility] in [place].     Pt returns today, it seems, at the request of Dr. [name], to ligate left arm AVG.  And pt has the question as to whether we may now check her blood pressure in her left arm since her legs hurt too much when blood pressures are checked in her legs, states her legs hurt so much after blood pressures are checked in her legs, that she cannot move for a while.  She states her legs, only her thighs, hurt all the time, but worse when blood pressures are checked in her lower legs.   Pt states indicates that her blood pressure is labile, too high, and also has orthostatic hypotension.   She reports pain in her thighs with  walking, denies pain in her calves or feet with walking, denies non healing wounds in her feet or legs.   Pt states that she has never had a dialysis access placed in her right arm. She is right hand dominant.    Past Medical History:  Diagnosis Date  . Arthritis    hands  . Chronic kidney disease    Stage 5 Dialysis M/W/F  . Constipation   . Diabetes mellitus without complication ([place])   . GERD (gastroesophageal reflux disease)   . Headache   . Hyperlipidemia   . Hypertension   . Irregular heart rate   . Stroke [facility])    TIA 10 years ago     Social History Social History   Tobacco Use  . Smoking status: Never Smoker  . Smokeless tobacco: Never Used  Substance Use Topics  . Alcohol use: Never    Frequency: Never  . Drug use: Never    Family History Family History  Problem Relation Age of Onset  . Heart murmur Mother   . Heart failure Mother   . Diabetes Mother   . Cirrhosis Father   . Alcoholism Father     Surgical History Past Surgical History:  Procedure Laterality Date  . ABDOMINAL HYSTERECTOMY    . AV FISTULA PLACEMENT Left 06/13/2018   Procedure: ARTERIOVENOUS (AV) FISTULA CREATION;  Surgeon: Rosetta Posner, MD;  Location: [facility] OR;  Service: Vascular;  Laterality: Left;  . AV FISTULA PLACEMENT Left 08/18/2018   Procedure: INSERTION OF 4-7MM X 45CM ARTERIOVENOUS (AV) GORE-TEX GRAFT LEFT  FOREARM;  Surgeon: Rosetta Posner, MD;  Location: [place];  Service: Vascular;  Laterality: Left;  . INSERTION OF DIALYSIS CATHETER N/A 08/18/2018   Procedure: INSERTION OF DIALYSIS CATHETER;  Surgeon: Rosetta Posner, MD;  Location: [place];  Service: Vascular;  Laterality: N/A;  . IR FLUORO GUIDE CV LINE RIGHT  12/06/2018  . IR PTA ADDL CENTRAL DIALYSIS SEG THRU DIALY CIRCUIT RIGHT Right 12/06/2018  . THROMBECTOMY AND REVISION OF ARTERIOVENTOUS (AV) GORETEX  GRAFT Left 09/29/2018   Procedure: INSERTION OF ARTERIOVENTOUS (AV) GORETEX  GRAFT ARM;  Surgeon: Waynetta Sandy, MD;  Location: [place];  Service: Vascular;  Laterality: Left;  Marland Kitchen VENOGRAM  10/27/2018   Procedure: Venogram;  Surgeon: Marty Heck, MD;  Location: [place] CV [facility];  Service: Cardiovascular;;  bilateral arm    No Known Allergies  Current Outpatient Medications  Medication Sig Dispense Refill  . aspirin EC 81 MG tablet Take 81 mg by mouth daily.     . calcium carbonate (TUMS - DOSED IN MG ELEMENTAL CALCIUM) 500 MG chewable tablet Chew 1 tablet by mouth daily at 12 noon.     . carvedilol (COREG) 6.25 MG tablet Take 6.25 mg by mouth 2 (two) times  daily.    . fluticasone (FLONASE) 50 MCG/ACT nasal spray Place 1 spray into both nostrils daily as needed for allergies or rhinitis.     Marland Kitchen insulin NPH-regular Human (NOVOLIN 70/30) (70-30) 100 UNIT/ML injection Inject 40-45 Units into the skin See admin instructions. Inject 40 units in the morning and 45 units in the evening    . isosorbide mononitrate (IMDUR) 30 MG 24 hr tablet Take 30 mg by mouth daily.    Marland Kitchen amLODipine (NORVASC) 10 MG tablet Take 10 mg by mouth daily.     Marland Kitchen labetalol (NORMODYNE) 100 MG tablet Take 100 mg by mouth 2 (two) times daily.    Marland Kitchen oxyCODONE-acetaminophen (PERCOCET/ROXICET) 5-325  MG tablet Take 1 tablet by mouth every 6 (six) hours as needed. (Patient not taking: Reported on 02/20/2019) 10 tablet 0  . pantoprazole (PROTONIX) 40 MG tablet Take 40 mg by mouth daily.    . VELTASSA 8.4 g packet Take 8.4 g by mouth daily.      No current facility-administered medications for this visit.    Facility-Administered Medications Ordered in Other Visits  Medication Dose Route Frequency Provider Last Rate Last Dose  . 0.9 %  sodium chloride infusion   Intravenous Continuous Monia Sabal, PA-C         REVIEW OF SYSTEMS: see HPI for pertinent positives and negatives    PHYSICAL EXAMINATION:  Vitals:   02/20/19 1020  BP: (!) 161/100  Pulse: (!) 104  Resp: 14  Temp: 98.2 F (36.8 C)  TempSrc: Oral  SpO2: 100%  Weight: 155 lb (70.3 kg)  Height: 5\' 7"  (1.702 m)   Body mass index is 24.28 kg/m.  General: The patient appears her stated age.   HEENT:  No gross abnormalities Pulmonary: Respirations are non-labored, limited air movement in all fields, no rales, rhonchi, or wheezes.  Abdomen: Soft and non-tender with normal bowel sounds.  Musculoskeletal: There are no major deformities.   Neurologic: No focal weakness or paresthesias are detected, Skin: There are no ulcer or rashes noted, no signs of ischemia in hands or feet. Psychiatric: The patient has normal affect.  Cardiovascular: There is a regular rate, tachycardic at 110/minute, with significant murmur appreciated. Bilateral radial pulses are 2+ palpable. Left upper arm and left lower arm AV grafts with no palpable thrill, no audible bruit.  Bilateral DP pulses are 1-2+ palpable. Bilateral femoral pulses are 2+ palpable. Right popliteal pulse is 1+ palpable, left popliteal pulse is faintly palpable (palpated through pt jeans). TDC at right IJ.     Medical Decision Making  Esmee Fallaw is a 68 y.o. female who is s/p thrombosed left upper arm and left forearm recent AV graft placements. She is currently dialyzed via [place] in her right IJ. She has need for a permanent access.  She has never had an access in her right arm.   I spoke with Dr. Trula Slade re pt HPI, physical exam results, and he had already reviewed pt venogram results and documented history.  Pt is concerned that she needs blood pressure checked in her left arm since legs are so painful, and since blood pressures cannot be checked in her right arm after AV graft is placed.  Since her left arm AV grafts are thrombosed, as confirmed on several studies, and on physical exam with not bruits and no thrills, there is no need for ligation of her AVG in the left arm, since they are thrombosed.  Therefore blood pressures may be freely checked in her left arm.   Will schedule pt for placement of right arm AV graft, pending scheduling of OR time due to pandemic COVID 19 restrictions.   Clemon Chambers, RN, MSN, FNP-C [facility] Office: [phone]  02/20/2019, 10:44 AM  Clinic MD: Trula Slade

## 2019-02-20 NOTE — H&P (View-Only) (Signed)
CC: need for blood pressures to be checked in left arm, s/p thrombosed AV grafts in left forearm and left upper arm, legs are very painful with blood pressure checks in legs, had TDC in place    History of Present Illness  [name] is a 68 y.o. (10/01/1951) female who is s/p bilateral upper extremity venogram on 10-27-18 by Dr. Carlis Abbott for thrombosed left brachial artery to axillary vein AV graft.  FINDING(S): 1. The left upper extremity brach ax graft is thrombosed.  Contrast in the left upper extremity was very sluggish to empty from the left arm and became very stagnant in the central venous system.  Had difficulty identifying contrast emptying into the right atrium definitively although no overt stenosis was identified once contrast reached the axillary vein and further centrally.  There were no superficial usable veins in the left arm. 2. The right upper extremity venous system emptied much more briskly even in the setting of a right IJ tunneled catheter.  There was no evidence of central stenosis.  The brachial veins were stenosed in the mid upper arm but the proximal brachial vein and axillary vein were widely patent and no overt other central stenosis.  Plan: Would favor right upper extremity graft.  Both the patient's left upper arm graft and left forearm graft have thrombosed after only 3 weeks and contrast was very sluggish emptying from the left upper extremity suggesting a severe outflow problem.  We did not identify a definitive central stenosis from the left but did have trouble confirming contrast was completely emptying into the right atrium from the left arm suggesting an underlying issue.   Before the venogram she was s/p Left arm brachial to axillary AV graft with 4-7 mm PTFE on 09-29-18 by Dr. Donzetta Matters. She had previously and recently underwent placement of a left arm AV graft and a forearm loop configuration.  That had subsequent thrombosed.  On 12-14-18, triage nurse for [facility],  [name] RN, received a call from patient to cancel her RUE AVG placement scheduled for the next day with Dr. Carlis Abbott. Patient left message that she wanted to cancel due to "having BP taken in right arm is only option". When nurse tried to call her back at [phone], she did not answer and had no voicemail that was set up. Zigmund Daniel then called her HD center [facility] [phone]) and spoke to [place] who said the patient told her something about not being able to take future BP readings in her right arm. [name] told patient to discuss with Dr. Lowanda Foster. This is the 5th time this patient has canceled this surgery;  her paperwork to be placed in pending file.  She dialyzes MWF via [place] at right side of chest, at [facility] in [place].     Pt returns today, it seems, at the request of Dr. [name], to ligate left arm AVG.  And pt has the question as to whether we may now check her blood pressure in her left arm since her legs hurt too much when blood pressures are checked in her legs, states her legs hurt so much after blood pressures are checked in her legs, that she cannot move for a while.  She states her legs, only her thighs, hurt all the time, but worse when blood pressures are checked in her lower legs.   Pt states indicates that her blood pressure is labile, too high, and also has orthostatic hypotension.   She reports pain in her thighs with  walking, denies pain in her calves or feet with walking, denies non healing wounds in her feet or legs.   Pt states that she has never had a dialysis access placed in her right arm. She is right hand dominant.    Past Medical History:  Diagnosis Date  . Arthritis    hands  . Chronic kidney disease    Stage 5 Dialysis M/W/F  . Constipation   . Diabetes mellitus without complication ([place])   . GERD (gastroesophageal reflux disease)   . Headache   . Hyperlipidemia   . Hypertension   . Irregular heart rate   . Stroke [facility])    TIA 10 years ago     Social History Social History   Tobacco Use  . Smoking status: Never Smoker  . Smokeless tobacco: Never Used  Substance Use Topics  . Alcohol use: Never    Frequency: Never  . Drug use: Never    Family History Family History  Problem Relation Age of Onset  . Heart murmur Mother   . Heart failure Mother   . Diabetes Mother   . Cirrhosis Father   . Alcoholism Father     Surgical History Past Surgical History:  Procedure Laterality Date  . ABDOMINAL HYSTERECTOMY    . AV FISTULA PLACEMENT Left 06/13/2018   Procedure: ARTERIOVENOUS (AV) FISTULA CREATION;  Surgeon: Rosetta Posner, MD;  Location: [facility] OR;  Service: Vascular;  Laterality: Left;  . AV FISTULA PLACEMENT Left 08/18/2018   Procedure: INSERTION OF 4-7MM X 45CM ARTERIOVENOUS (AV) GORE-TEX GRAFT LEFT  FOREARM;  Surgeon: Rosetta Posner, MD;  Location: [place];  Service: Vascular;  Laterality: Left;  . INSERTION OF DIALYSIS CATHETER N/A 08/18/2018   Procedure: INSERTION OF DIALYSIS CATHETER;  Surgeon: Rosetta Posner, MD;  Location: [place];  Service: Vascular;  Laterality: N/A;  . IR FLUORO GUIDE CV LINE RIGHT  12/06/2018  . IR PTA ADDL CENTRAL DIALYSIS SEG THRU DIALY CIRCUIT RIGHT Right 12/06/2018  . THROMBECTOMY AND REVISION OF ARTERIOVENTOUS (AV) GORETEX  GRAFT Left 09/29/2018   Procedure: INSERTION OF ARTERIOVENTOUS (AV) GORETEX  GRAFT ARM;  Surgeon: Waynetta Sandy, MD;  Location: [place];  Service: Vascular;  Laterality: Left;  Marland Kitchen VENOGRAM  10/27/2018   Procedure: Venogram;  Surgeon: Marty Heck, MD;  Location: [place] CV LAB;  Service: Cardiovascular;;  bilateral arm    No Known Allergies  Current Outpatient Medications  Medication Sig Dispense Refill  . aspirin EC 81 MG tablet Take 81 mg by mouth daily.     . calcium carbonate (TUMS - DOSED IN MG ELEMENTAL CALCIUM) 500 MG chewable tablet Chew 1 tablet by mouth daily at 12 noon.     . carvedilol (COREG) 6.25 MG tablet Take 6.25 mg by mouth 2 (two) times  daily.    . fluticasone (FLONASE) 50 MCG/ACT nasal spray Place 1 spray into both nostrils daily as needed for allergies or rhinitis.     Marland Kitchen insulin NPH-regular Human (NOVOLIN 70/30) (70-30) 100 UNIT/ML injection Inject 40-45 Units into the skin See admin instructions. Inject 40 units in the morning and 45 units in the evening    . isosorbide mononitrate (IMDUR) 30 MG 24 hr tablet Take 30 mg by mouth daily.    Marland Kitchen amLODipine (NORVASC) 10 MG tablet Take 10 mg by mouth daily.     Marland Kitchen labetalol (NORMODYNE) 100 MG tablet Take 100 mg by mouth 2 (two) times daily.    Marland Kitchen oxyCODONE-acetaminophen (PERCOCET/ROXICET) 5-325  MG tablet Take 1 tablet by mouth every 6 (six) hours as needed. (Patient not taking: Reported on 02/20/2019) 10 tablet 0  . pantoprazole (PROTONIX) 40 MG tablet Take 40 mg by mouth daily.    . VELTASSA 8.4 g packet Take 8.4 g by mouth daily.      No current facility-administered medications for this visit.    Facility-Administered Medications Ordered in Other Visits  Medication Dose Route Frequency Provider Last Rate Last Dose  . 0.9 %  sodium chloride infusion   Intravenous Continuous Monia Sabal, PA-C         REVIEW OF SYSTEMS: see HPI for pertinent positives and negatives    PHYSICAL EXAMINATION:  Vitals:   02/20/19 1020  BP: (!) 161/100  Pulse: (!) 104  Resp: 14  Temp: 98.2 F (36.8 C)  TempSrc: Oral  SpO2: 100%  Weight: 155 lb (70.3 kg)  Height: 5\' 7"  (1.702 m)   Body mass index is 24.28 kg/m.  General: The patient appears her stated age.   HEENT:  No gross abnormalities Pulmonary: Respirations are non-labored, limited air movement in all fields, no rales, rhonchi, or wheezes.  Abdomen: Soft and non-tender with normal bowel sounds.  Musculoskeletal: There are no major deformities.   Neurologic: No focal weakness or paresthesias are detected, Skin: There are no ulcer or rashes noted, no signs of ischemia in hands or feet. Psychiatric: The patient has normal affect.  Cardiovascular: There is a regular rate, tachycardic at 110/minute, with significant murmur appreciated. Bilateral radial pulses are 2+ palpable. Left upper arm and left lower arm AV grafts with no palpable thrill, no audible bruit.  Bilateral DP pulses are 1-2+ palpable. Bilateral femoral pulses are 2+ palpable. Right popliteal pulse is 1+ palpable, left popliteal pulse is faintly palpable (palpated through pt jeans). TDC at right IJ.     Medical Decision Making  Kristopher Delk is a 68 y.o. female who is s/p thrombosed left upper arm and left forearm recent AV graft placements. She is currently dialyzed via [place] in her right IJ. She has need for a permanent access.  She has never had an access in her right arm.   I spoke with Dr. Trula Slade re pt HPI, physical exam results, and he had already reviewed pt venogram results and documented history.  Pt is concerned that she needs blood pressure checked in her left arm since legs are so painful, and since blood pressures cannot be checked in her right arm after AV graft is placed.  Since her left arm AV grafts are thrombosed, as confirmed on several studies, and on physical exam with not bruits and no thrills, there is no need for ligation of her AVG in the left arm, since they are thrombosed.  Therefore blood pressures may be freely checked in her left arm.   Will schedule pt for placement of right arm AV graft, pending scheduling of OR time due to pandemic COVID 19 restrictions.   Clemon Chambers, RN, MSN, FNP-C [facility] [place] Office: [phone]  02/20/2019, 10:44 AM  Clinic MD: Trula Slade

## 2019-02-21 ENCOUNTER — Encounter: Payer: Medicare PPO | Admitting: Family

## 2019-02-21 ENCOUNTER — Inpatient Hospital Stay (HOSPITAL_COMMUNITY): Admission: RE | Admit: 2019-02-21 | Payer: Medicare PPO | Source: Ambulatory Visit

## 2019-02-22 DIAGNOSIS — Z23 Encounter for immunization: Secondary | ICD-10-CM | POA: Diagnosis not present

## 2019-02-22 DIAGNOSIS — N186 End stage renal disease: Secondary | ICD-10-CM | POA: Diagnosis not present

## 2019-02-22 DIAGNOSIS — Z992 Dependence on renal dialysis: Secondary | ICD-10-CM | POA: Diagnosis not present

## 2019-02-24 DIAGNOSIS — Z23 Encounter for immunization: Secondary | ICD-10-CM | POA: Diagnosis not present

## 2019-02-24 DIAGNOSIS — N186 End stage renal disease: Secondary | ICD-10-CM | POA: Diagnosis not present

## 2019-02-24 DIAGNOSIS — Z992 Dependence on renal dialysis: Secondary | ICD-10-CM | POA: Diagnosis not present

## 2019-02-27 DIAGNOSIS — N186 End stage renal disease: Secondary | ICD-10-CM | POA: Diagnosis not present

## 2019-02-27 DIAGNOSIS — Z23 Encounter for immunization: Secondary | ICD-10-CM | POA: Diagnosis not present

## 2019-02-27 DIAGNOSIS — Z992 Dependence on renal dialysis: Secondary | ICD-10-CM | POA: Diagnosis not present

## 2019-02-28 ENCOUNTER — Other Ambulatory Visit: Payer: Self-pay | Admitting: Surgery

## 2019-02-28 ENCOUNTER — Encounter: Payer: Self-pay | Admitting: Surgery

## 2019-03-01 DIAGNOSIS — Z992 Dependence on renal dialysis: Secondary | ICD-10-CM | POA: Diagnosis not present

## 2019-03-01 DIAGNOSIS — Z23 Encounter for immunization: Secondary | ICD-10-CM | POA: Diagnosis not present

## 2019-03-01 DIAGNOSIS — N186 End stage renal disease: Secondary | ICD-10-CM | POA: Diagnosis not present

## 2019-03-03 DIAGNOSIS — Z23 Encounter for immunization: Secondary | ICD-10-CM | POA: Diagnosis not present

## 2019-03-03 DIAGNOSIS — N186 End stage renal disease: Secondary | ICD-10-CM | POA: Diagnosis not present

## 2019-03-03 DIAGNOSIS — Z992 Dependence on renal dialysis: Secondary | ICD-10-CM | POA: Diagnosis not present

## 2019-03-06 ENCOUNTER — Telehealth: Payer: Self-pay | Admitting: *Deleted

## 2019-03-06 DIAGNOSIS — Z23 Encounter for immunization: Secondary | ICD-10-CM | POA: Diagnosis not present

## 2019-03-06 DIAGNOSIS — Z992 Dependence on renal dialysis: Secondary | ICD-10-CM | POA: Diagnosis not present

## 2019-03-06 DIAGNOSIS — N186 End stage renal disease: Secondary | ICD-10-CM | POA: Diagnosis not present

## 2019-03-06 NOTE — Anesthesia Preprocedure Evaluation (Addendum)
Anesthesia Evaluation  Patient identified by MRN, date of birth, ID band Patient awake    Reviewed: Allergy & Precautions, H&P , NPO status , Patient's Chart, lab work & pertinent test results, reviewed documented beta blocker date and time   Airway Mallampati: II  TM Distance: >3 FB Neck ROM: Full    Dental no notable dental hx. (+) Edentulous Upper, Partial Lower, Dental Advisory Given   Pulmonary neg pulmonary ROS,    Pulmonary exam normal breath sounds clear to auscultation       Cardiovascular Exercise Tolerance: Good hypertension, Pt. on medications and Pt. on home beta blockers  Rhythm:Regular Rate:Normal     Neuro/Psych  Headaches, CVA, No Residual Symptoms negative psych ROS   GI/Hepatic Neg liver ROS, GERD  Controlled,  Endo/Other  diabetes, Insulin Dependent  Renal/GU ESRF and DialysisRenal disease  negative genitourinary   Musculoskeletal  (+) Arthritis , Osteoarthritis,    Abdominal   Peds  Hematology negative hematology ROS (+)   Anesthesia Other Findings   Reproductive/Obstetrics negative OB ROS                            Anesthesia Physical Anesthesia Plan  ASA: III  Anesthesia Plan: General   Post-op Pain Management:    Induction: Intravenous  PONV Risk Score and Plan: 4 or greater and Ondansetron, Dexamethasone and Midazolam  Airway Management Planned: Oral ETT  Additional Equipment:   Intra-op Plan:   Post-operative Plan: Extubation in OR  Informed Consent: I have reviewed the patients History and Physical, chart, labs and discussed the procedure including the risks, benefits and alternatives for the proposed anesthesia with the patient or authorized representative who has indicated his/her understanding and acceptance.     Dental advisory given  Plan Discussed with: CRNA  Anesthesia Plan Comments:         Anesthesia Quick Evaluation

## 2019-03-06 NOTE — Progress Notes (Signed)
Attempted to call pt for pre-op call. No answer and she has no voicemail available. Tried calling her emergency contact person and her number was disconnected.  There is a note from the nurse at [facility] that pt knows the time of arrival and other instructions.

## 2019-03-06 NOTE — Telephone Encounter (Signed)
Call to patient with time change of 03/07/2019 procedure. [facility]. No other change in pre-op instructions. Verbalized understanding.

## 2019-03-07 ENCOUNTER — Encounter (HOSPITAL_COMMUNITY): Payer: Self-pay | Admitting: *Deleted

## 2019-03-07 ENCOUNTER — Ambulatory Visit (HOSPITAL_COMMUNITY): Payer: Medicare PPO | Admitting: Anesthesiology

## 2019-03-07 ENCOUNTER — Telehealth: Payer: Self-pay | Admitting: Surgery

## 2019-03-07 ENCOUNTER — Encounter (HOSPITAL_COMMUNITY)
Admission: RE | Disposition: A | Discharge status: Home / Self Care | Payer: Self-pay | Source: Home / Self Care | Attending: Surgery

## 2019-03-07 ENCOUNTER — Ambulatory Visit (HOSPITAL_COMMUNITY)
Admission: RE | Admit: 2019-03-07 | Discharge: 2019-03-07 | Disposition: A | Discharge status: Home / Self Care | Payer: Medicare PPO | Attending: Surgery | Admitting: Surgery

## 2019-03-07 ENCOUNTER — Other Ambulatory Visit: Payer: Self-pay

## 2019-03-07 DIAGNOSIS — E1122 Type 2 diabetes mellitus with diabetic chronic kidney disease: Secondary | ICD-10-CM | POA: Diagnosis not present

## 2019-03-07 DIAGNOSIS — T82868A Thrombosis of vascular prosthetic devices, implants and grafts, initial encounter: Secondary | ICD-10-CM | POA: Insufficient documentation

## 2019-03-07 DIAGNOSIS — Z8673 Personal history of transient ischemic attack (TIA), and cerebral infarction without residual deficits: Secondary | ICD-10-CM | POA: Insufficient documentation

## 2019-03-07 DIAGNOSIS — I70208 Unspecified atherosclerosis of native arteries of extremities, other extremity: Secondary | ICD-10-CM | POA: Insufficient documentation

## 2019-03-07 DIAGNOSIS — N185 Chronic kidney disease, stage 5: Secondary | ICD-10-CM | POA: Diagnosis not present

## 2019-03-07 DIAGNOSIS — Z7982 Long term (current) use of aspirin: Secondary | ICD-10-CM | POA: Insufficient documentation

## 2019-03-07 DIAGNOSIS — E1151 Type 2 diabetes mellitus with diabetic peripheral angiopathy without gangrene: Secondary | ICD-10-CM | POA: Diagnosis not present

## 2019-03-07 DIAGNOSIS — I120 Hypertensive chronic kidney disease with stage 5 chronic kidney disease or end stage renal disease: Secondary | ICD-10-CM | POA: Diagnosis not present

## 2019-03-07 DIAGNOSIS — N186 End stage renal disease: Secondary | ICD-10-CM | POA: Diagnosis not present

## 2019-03-07 DIAGNOSIS — Y832 Surgical operation with anastomosis, bypass or graft as the cause of abnormal reaction of the patient, or of later complication, without mention of misadventure at the time of the procedure: Secondary | ICD-10-CM | POA: Diagnosis not present

## 2019-03-07 DIAGNOSIS — Z7951 Long term (current) use of inhaled steroids: Secondary | ICD-10-CM | POA: Insufficient documentation

## 2019-03-07 DIAGNOSIS — Z794 Long term (current) use of insulin: Secondary | ICD-10-CM | POA: Insufficient documentation

## 2019-03-07 DIAGNOSIS — Z79899 Other long term (current) drug therapy: Secondary | ICD-10-CM | POA: Diagnosis not present

## 2019-03-07 DIAGNOSIS — Z992 Dependence on renal dialysis: Secondary | ICD-10-CM | POA: Insufficient documentation

## 2019-03-07 HISTORY — PX: BASCILIC VEIN TRANSPOSITION: SHX5742

## 2019-03-07 LAB — GLUCOSE, CAPILLARY
Glucose-Capillary: 113 mg/dL — ABNORMAL HIGH (ref 70–99)
Glucose-Capillary: 110 mg/dL — ABNORMAL HIGH (ref 70–99)

## 2019-03-07 LAB — POCT I-STAT 4, (NA,K, GLUC, HGB,HCT)
Glucose, Bld: 125 mg/dL — ABNORMAL HIGH (ref 70–99)
HCT: 42 % (ref 36.0–46.0)
Hemoglobin: 14.3 g/dL (ref 12.0–15.0)
Potassium: 4.4 mmol/L (ref 3.5–5.1)
Sodium: 138 mmol/L (ref 135–145)

## 2019-03-07 SURGERY — TRANSPOSITION, VEIN, BASILIC
Anesthesia: General | Laterality: Right

## 2019-03-07 MED ORDER — ROCURONIUM BROMIDE 50 MG/5ML IV SOSY
PREFILLED_SYRINGE | INTRAVENOUS | Status: AC
  Filled 2019-03-07: qty 5

## 2019-03-07 MED ORDER — FENTANYL CITRATE (PF) 250 MCG/5ML IJ SOLN
INTRAMUSCULAR | Status: DC | PRN
  Administered 2019-03-07: 100 ug via INTRAVENOUS
  Administered 2019-03-07 (×3): 50 ug via INTRAVENOUS

## 2019-03-07 MED ORDER — ONDANSETRON HCL 4 MG/2ML IJ SOLN
INTRAMUSCULAR | Status: AC
  Filled 2019-03-07: qty 2

## 2019-03-07 MED ORDER — SUCCINYLCHOLINE CHLORIDE 20 MG/ML IJ SOLN
INTRAMUSCULAR | Status: DC | PRN
  Administered 2019-03-07: 80 mg via INTRAVENOUS

## 2019-03-07 MED ORDER — FENTANYL CITRATE (PF) 250 MCG/5ML IJ SOLN
INTRAMUSCULAR | Status: AC
  Filled 2019-03-07: qty 5

## 2019-03-07 MED ORDER — PROPOFOL 10 MG/ML IV BOLUS
INTRAVENOUS | Status: AC
  Filled 2019-03-07: qty 20

## 2019-03-07 MED ORDER — HEPARIN SODIUM (PORCINE) 1000 UNIT/ML IJ SOLN
INTRAMUSCULAR | Status: DC | PRN
  Administered 2019-03-07: 3000 [IU] via INTRAVENOUS

## 2019-03-07 MED ORDER — LIDOCAINE HCL (CARDIAC) PF 100 MG/5ML IV SOSY
PREFILLED_SYRINGE | INTRAVENOUS | Status: DC | PRN
  Administered 2019-03-07: 60 mg via INTRAVENOUS

## 2019-03-07 MED ORDER — MIDAZOLAM HCL 5 MG/5ML IJ SOLN
INTRAMUSCULAR | Status: DC | PRN
  Administered 2019-03-07: 1 mg via INTRAVENOUS

## 2019-03-07 MED ORDER — LIDOCAINE 2% (20 MG/ML) 5 ML SYRINGE
INTRAMUSCULAR | Status: AC
  Filled 2019-03-07: qty 5

## 2019-03-07 MED ORDER — OXYCODONE-ACETAMINOPHEN 5-325 MG PO TABS
1.0000 | ORAL_TABLET | Freq: Once | ORAL | Status: AC
  Administered 2019-03-07: 1 via ORAL

## 2019-03-07 MED ORDER — CEFAZOLIN SODIUM-DEXTROSE 2-4 GM/100ML-% IV SOLN
2.0000 g | INTRAVENOUS | Status: AC
  Administered 2019-03-07: 2 g via INTRAVENOUS
  Filled 2019-03-07: qty 100

## 2019-03-07 MED ORDER — ALBUMIN HUMAN 5 % IV SOLN
INTRAVENOUS | Status: DC | PRN
  Administered 2019-03-07: 11:00:00 via INTRAVENOUS

## 2019-03-07 MED ORDER — SODIUM CHLORIDE 0.9 % IV SOLN
INTRAVENOUS | Status: DC
  Administered 2019-03-07 (×2): via INTRAVENOUS

## 2019-03-07 MED ORDER — HYDROMORPHONE HCL 1 MG/ML IJ SOLN: 0.2500 mg | INTRAMUSCULAR | Status: DC | PRN

## 2019-03-07 MED ORDER — DEXAMETHASONE SODIUM PHOSPHATE 10 MG/ML IJ SOLN
INTRAMUSCULAR | Status: DC | PRN
  Administered 2019-03-07: 10 mg via INTRAVENOUS

## 2019-03-07 MED ORDER — PHENYLEPHRINE HCL (PRESSORS) 10 MG/ML IV SOLN
INTRAVENOUS | Status: DC | PRN
  Administered 2019-03-07: 80 ug via INTRAVENOUS

## 2019-03-07 MED ORDER — GLYCOPYRROLATE PF 0.2 MG/ML IJ SOSY
PREFILLED_SYRINGE | INTRAMUSCULAR | Status: AC
  Filled 2019-03-07: qty 1

## 2019-03-07 MED ORDER — ONDANSETRON HCL 4 MG/2ML IJ SOLN
INTRAMUSCULAR | Status: DC | PRN
  Administered 2019-03-07: 4 mg via INTRAVENOUS

## 2019-03-07 MED ORDER — PHENYLEPHRINE 40 MCG/ML (10ML) SYRINGE FOR IV PUSH (FOR BLOOD PRESSURE SUPPORT)
PREFILLED_SYRINGE | INTRAVENOUS | Status: AC
  Filled 2019-03-07: qty 10

## 2019-03-07 MED ORDER — OXYCODONE-ACETAMINOPHEN 5-325 MG PO TABS: 1.0000 | ORAL_TABLET | Freq: Four times a day (QID) | ORAL | 0 refills | Status: DC | PRN

## 2019-03-07 MED ORDER — OXYCODONE-ACETAMINOPHEN 5-325 MG PO TABS
ORAL_TABLET | ORAL | Status: AC
  Filled 2019-03-07: qty 1

## 2019-03-07 MED ORDER — PROTAMINE SULFATE 10 MG/ML IV SOLN
INTRAVENOUS | Status: DC | PRN
  Administered 2019-03-07: 25 mg via INTRAVENOUS

## 2019-03-07 MED ORDER — SODIUM CHLORIDE 0.9 % IV SOLN
INTRAVENOUS | Status: AC
  Filled 2019-03-07: qty 1.2

## 2019-03-07 MED ORDER — 0.9 % SODIUM CHLORIDE (POUR BTL) OPTIME
TOPICAL | Status: DC | PRN
  Administered 2019-03-07: 11:00:00 1000 mL

## 2019-03-07 MED ORDER — SODIUM CHLORIDE 0.9 % IV SOLN
INTRAVENOUS | Status: DC | PRN
  Administered 2019-03-07: 11:00:00

## 2019-03-07 MED ORDER — SODIUM CHLORIDE 0.9 % IV SOLN
INTRAVENOUS | Status: DC | PRN
  Administered 2019-03-07: 20 ug/min via INTRAVENOUS

## 2019-03-07 MED ORDER — ACETAMINOPHEN 500 MG PO TABS
1000.0000 mg | ORAL_TABLET | Freq: Once | ORAL | Status: AC
  Administered 2019-03-07: 1000 mg via ORAL
  Filled 2019-03-07: qty 2

## 2019-03-07 MED ORDER — DEXAMETHASONE SODIUM PHOSPHATE 10 MG/ML IJ SOLN
INTRAMUSCULAR | Status: AC
  Filled 2019-03-07: qty 2

## 2019-03-07 MED ORDER — PROPOFOL 10 MG/ML IV BOLUS
INTRAVENOUS | Status: DC | PRN
  Administered 2019-03-07: 100 mg via INTRAVENOUS

## 2019-03-07 MED ORDER — MIDAZOLAM HCL 2 MG/2ML IJ SOLN
INTRAMUSCULAR | Status: AC
  Filled 2019-03-07: qty 2

## 2019-03-07 MED ORDER — SUCCINYLCHOLINE CHLORIDE 200 MG/10ML IV SOSY
PREFILLED_SYRINGE | INTRAVENOUS | Status: AC
  Filled 2019-03-07: qty 10

## 2019-03-07 MED ORDER — EPHEDRINE 5 MG/ML INJ
INTRAVENOUS | Status: AC
  Filled 2019-03-07: qty 10

## 2019-03-07 SURGICAL SUPPLY — 30 items
ARMBAND PINK RESTRICT EXTREMIT (MISCELLANEOUS) ×4 IMPLANT
CANISTER SUCT 3000ML PPV (MISCELLANEOUS) ×2 IMPLANT
CLIP VESOCCLUDE MED 6/CT (CLIP) ×2 IMPLANT
CLIP VESOCCLUDE SM WIDE 6/CT (CLIP) ×2 IMPLANT
COVER WAND RF STERILE (DRAPES) IMPLANT
DERMABOND ADVANCED (GAUZE/BANDAGES/DRESSINGS) ×1
DERMABOND ADVANCED .7 DNX12 (GAUZE/BANDAGES/DRESSINGS) ×1 IMPLANT
ELECT REM PT RETURN 9FT ADLT (ELECTROSURGICAL) ×2
ELECTRODE REM PT RTRN 9FT ADLT (ELECTROSURGICAL) ×1 IMPLANT
GLOVE BIOGEL PI IND STRL 7.5 (GLOVE) ×1 IMPLANT
GLOVE BIOGEL PI INDICATOR 7.5 (GLOVE) ×1
GLOVE SURG SS PI 7.5 STRL IVOR (GLOVE) ×2 IMPLANT
GOWN STRL REUS W/ TWL LRG LVL3 (GOWN DISPOSABLE) ×2 IMPLANT
GOWN STRL REUS W/ TWL XL LVL3 (GOWN DISPOSABLE) ×1 IMPLANT
GOWN STRL REUS W/TWL LRG LVL3 (GOWN DISPOSABLE) ×2
GOWN STRL REUS W/TWL XL LVL3 (GOWN DISPOSABLE) ×1
HEMOSTAT SNOW SURGICEL 2X4 (HEMOSTASIS) ×2 IMPLANT
KIT BASIN OR (CUSTOM PROCEDURE TRAY) ×2 IMPLANT
KIT TURNOVER KIT B (KITS) ×2 IMPLANT
NS IRRIG 1000ML POUR BTL (IV SOLUTION) ×2 IMPLANT
PACK CV ACCESS (CUSTOM PROCEDURE TRAY) ×2 IMPLANT
PAD ARMBOARD 7.5X6 YLW CONV (MISCELLANEOUS) ×4 IMPLANT
SUT PROLENE 6 0 BV (SUTURE) ×4 IMPLANT
SUT VIC AB 3-0 SH 27 (SUTURE) ×2
SUT VIC AB 3-0 SH 27X BRD (SUTURE) ×2 IMPLANT
SUT VICRYL 4-0 PS2 18IN ABS (SUTURE) ×4 IMPLANT
SYR TOOMEY 50ML (SYRINGE) IMPLANT
TOWEL GREEN STERILE (TOWEL DISPOSABLE) ×2 IMPLANT
UNDERPAD 30X30 (UNDERPADS AND DIAPERS) ×2 IMPLANT
WATER STERILE IRR 1000ML POUR (IV SOLUTION) ×2 IMPLANT

## 2019-03-07 NOTE — Telephone Encounter (Signed)
-----   Message from Gabriel Earing, [place] sent at 03/07/2019 11:12 AM EDT ----- S/p right 1st stage BVT 03/07/2019.  F/u with Dr. Trula Slade in 6 weeks with duplex.  Thanks

## 2019-03-07 NOTE — Anesthesia Postprocedure Evaluation (Signed)
Anesthesia Post Note  Patient: [name]  Procedure(s) Performed: First Stage Bascilic Vein Transposition Right Arm (Right )     Patient location during evaluation: PACU Anesthesia Type: General Level of consciousness: awake and alert Pain management: pain level controlled Vital Signs Assessment: post-procedure vital signs reviewed and stable Respiratory status: spontaneous breathing, nonlabored ventilation and respiratory function stable Cardiovascular status: blood pressure returned to baseline and stable Postop Assessment: no apparent nausea or vomiting Anesthetic complications: no    Last Vitals:  Vitals:   03/07/19 1217 03/07/19 1230  BP: (!) 142/74   Pulse: 83 81  Resp: 14 17  Temp:  (!) 36.2 C  SpO2: 96% 97%    Last Pain:  Vitals:   03/07/19 1230  TempSrc:   PainSc: 0-No pain                 [name],[name]

## 2019-03-07 NOTE — Telephone Encounter (Signed)
sch appt phone NA mld ltr 04/10/2019 10am Dialysis Duplex 1115am p/o MD

## 2019-03-07 NOTE — Interval H&P Note (Signed)
History and Physical Interval Note:  03/07/2019 7:29 AM  [name]  has presented today for surgery, with the diagnosis of CHRONIC KIDNEY DISEASE STAGE FIVE.  The various methods of treatment have been discussed with the patient and family. After consideration of risks, benefits and other options for treatment, the patient has consented to  Procedure(s): ARTERIOVENOUS GRAFT ON RIGHT ARM (Right) as a surgical intervention.  The patient's history has been reviewed, patient examined, no change in status, stable for surgery.  I have reviewed the patient's chart and labs.  Questions were answered to the patient's satisfaction.     Annamarie Major

## 2019-03-07 NOTE — Anesthesia Procedure Notes (Signed)
Procedure Name: Intubation Date/Time: 03/07/2019 10:01 AM Performed by: Shirlyn Goltz, CRNA Pre-anesthesia Checklist: Patient identified, Emergency Drugs available, Patient being monitored and Suction available Patient Re-evaluated:Patient Re-evaluated prior to induction Oxygen Delivery Method: Circle system utilized Preoxygenation: Pre-oxygenation with 100% oxygen Induction Type: IV induction and Rapid sequence Laryngoscope Size: Mac and 3 Grade View: Grade I Tube type: Oral Tube size: 7.0 mm Number of attempts: 1 Airway Equipment and Method: Stylet Placement Confirmation: ETT inserted through vocal cords under direct vision,  breath sounds checked- equal and bilateral and positive ETCO2 Secured at: 20 cm Tube secured with: Tape Dental Injury: Teeth and Oropharynx as per pre-operative assessment

## 2019-03-07 NOTE — Discharge Instructions (Signed)
° °  [facility]  Discharge Instructions  AV Fistula or Graft Surgery for Dialysis Access  Please refer to the following instructions for your post-procedure care. Your surgeon or physician assistant will discuss any changes with you.  Activity  You may drive the day following your surgery, if you are comfortable and no longer taking prescription pain medication. Resume full activity as the soreness in your incision resolves.  Bathing/Showering  You may shower after you go home. Keep your incision dry for 48 hours. Do not soak in a bathtub, hot tub, or swim until the incision heals completely. You may not shower if you have a hemodialysis catheter.  Incision Care  Clean your incision with mild soap and water after 48 hours. Pat the area dry with a clean towel. You do not need a bandage unless otherwise instructed. Do not apply any ointments or creams to your incision. You may have skin glue on your incision. Do not peel it off. It will come off on its own in about one week. Your arm may swell a bit after surgery. To reduce swelling use pillows to elevate your arm so it is above your heart. Your doctor will tell you if you need to lightly wrap your arm with an ACE bandage.  Diet  Resume your normal diet. There are not special food restrictions following this procedure. In order to heal from your surgery, it is CRITICAL to get adequate nutrition. Your body requires vitamins, minerals, and protein. Vegetables are the best source of vitamins and minerals. Vegetables also provide the perfect balance of protein. Processed food has little nutritional value, so try to avoid this.  Medications  Resume taking all of your medications. If your incision is causing pain, you may take over-the counter pain relievers such as acetaminophen (Tylenol). If you were prescribed a stronger pain medication, please be aware these medications can cause nausea and constipation. Prevent  nausea by taking the medication with a snack or meal. Avoid constipation by drinking plenty of fluids and eating foods with high amount of fiber, such as fruits, vegetables, and grains.  Do not take Tylenol if you are taking prescription pain medications.  Follow up Your surgeon may want to see you in the office following your access surgery. If so, this will be arranged at the time of your surgery.  Please call us immediately for any of the following conditions:  Increased pain, redness, drainage (pus) from your incision site Fever of 101 degrees or higher Severe or worsening pain at your incision site Hand pain or numbness.  Reduce your risk of vascular disease:  Stop smoking. If you would like help, call [facility] at 1-800-QUIT-NOW 519-789-4588) or [place] at [place] your cholesterol Maintain a desired weight Control your diabetes Keep your blood pressure down  Dialysis  It will take several weeks to several months for your new dialysis access to be ready for use. Your surgeon will determine when it is okay to use it. Your nephrologist will continue to direct your dialysis. You can continue to use your Permcath until your new access is ready for use.   03/07/2019 Corlette Ciano [id number] 04-18-1951  Surgeon(s): Serafina Mitchell, MD  Procedure(s): First Stage Basilic Vein Transposition Right Arm  x Do not stick fistula for 12 weeks    If you have any questions, please call the office at [phone].

## 2019-03-07 NOTE — Transfer of Care (Signed)
Immediate Anesthesia Transfer of Care Note  Patient: [name]  Procedure(s) Performed: First Stage Bascilic Vein Transposition Right Arm (Right )  Patient Location: PACU  Anesthesia Type:General  Level of Consciousness: awake, alert , oriented and patient cooperative  Airway & Oxygen Therapy: Patient Spontanous Breathing and Patient connected to face mask oxygen  Post-op Assessment: Report given to RN and Post -op Vital signs reviewed and stable  Post vital signs: Reviewed and stable  Last Vitals:  Vitals Value Taken Time  BP 139/71 03/07/2019 11:32 AM  Temp    Pulse 83 03/07/2019 11:32 AM  Resp 12 03/07/2019 11:32 AM  SpO2 100 % 03/07/2019 11:32 AM  Vitals shown include unvalidated device data.  Last Pain:  Vitals:   03/07/19 0751  TempSrc:   PainSc: 0-No pain      Patients Stated Pain Goal: 7 (30/73/54 3014)  Complications: No apparent anesthesia complications

## 2019-03-07 NOTE — Op Note (Signed)
    Patient name: [name] MRN: [id number] DOB: 1951/09/12 Sex: female  03/07/2019 Pre-operative Diagnosis: ESRD Post-operative diagnosis:  Same Surgeon:  Annamarie Major Assistants:  Leontine Locket Procedure:   First stage right basilic vein fistula creation Anesthesia: General Blood Loss: Minimal Specimens: None  Findings: The patient had a healthy appearing basilic vein on ultrasound until first days basilic vein fistula was created.  Brachial artery was heavily calcified  Indications: The patient has failed access in the left arm.  Preoperative venogram suggested no central stenosis on the right.  She comes in today for right-sided access.  I did discuss with the patient in the holding area that if she has a vein suitable for fistula creation that we would preferentially use her vein.  Procedure:  The patient was identified in the holding area and taken to [place] [facility]  The patient was then placed supine on the table. general anesthesia was administered.  The patient was prepped and draped in the usual sterile fashion.  A time out was called and antibiotics were administered.  Ultrasound was used to evaluate the veins in the upper arm.  She did have a 3-4 mm basilic vein.  A oblique incision was made at the antecubital crease.  I first dissected out the brachial artery.  This was circumferentially calcified and measured about 4 mm.  I then dissected out the basilic vein.  This was actually a healthy appearing vein.  It was marked for orientation.  Side branches were ligated between silk ties.  The vein was then transected distally.  It distended nicely to about a 4 mm vein.  Next, the patient was fully heparinized.  After the heparin circulated, the brachial artery was occluded with vascular clamps.  #11 blade was used to make a arteriotomy which extended longitudinally with Potts scissors.  The vein was then cut the appropriate length and spatulated to fit size the arteriotomy.  A running  anastomosis was created 6-0 Prolene.  Just prior to completion the appropriate flushing maneuvers were performed and the anastomosis was completed.  I inspected the course of the vein to make sure there were no kinks.  There was an excellent thrill within the vein.  There was also a brisk bi/triphasic radial artery Doppler signal.  25 mg of protamine were used to reverse the heparin.  Once hemostasis was satisfactory, the incision was closed with 2 layers of Vicryl followed by Dermabond.  There are no immediate complications.   Disposition: To PACU stable.   Theotis Burrow, M.D., [facility] [place] Office: [phone] Pager:  [phone]

## 2019-03-08 ENCOUNTER — Encounter (HOSPITAL_COMMUNITY): Payer: Self-pay | Admitting: Surgery

## 2019-03-08 DIAGNOSIS — N186 End stage renal disease: Secondary | ICD-10-CM | POA: Diagnosis not present

## 2019-03-08 DIAGNOSIS — Z23 Encounter for immunization: Secondary | ICD-10-CM | POA: Diagnosis not present

## 2019-03-08 DIAGNOSIS — Z992 Dependence on renal dialysis: Secondary | ICD-10-CM | POA: Diagnosis not present

## 2019-03-10 ENCOUNTER — Telehealth: Payer: Self-pay

## 2019-03-10 DIAGNOSIS — Z23 Encounter for immunization: Secondary | ICD-10-CM | POA: Diagnosis not present

## 2019-03-10 DIAGNOSIS — N186 End stage renal disease: Secondary | ICD-10-CM | POA: Diagnosis not present

## 2019-03-10 DIAGNOSIS — Z992 Dependence on renal dialysis: Secondary | ICD-10-CM | POA: Diagnosis not present

## 2019-03-10 NOTE — Telephone Encounter (Signed)
Erasmo Downer at the [facility] called to state that patient was there today and they listened to her AVF and there was no bruit or thrill. She had just had it placed on the 14th of this month and they found that concerning and wanted to know what we should do.   Spoke with Dr Donzetta Matters and given patient has catheter that can be used for her dialysis, pt has been scheduled to follow up in the office for UE art and Vein mapping as well as a visit.   Erasmo Downer was notified of the plan.  York Cerise, CMA

## 2019-03-13 DIAGNOSIS — Z23 Encounter for immunization: Secondary | ICD-10-CM | POA: Diagnosis not present

## 2019-03-13 DIAGNOSIS — N186 End stage renal disease: Secondary | ICD-10-CM | POA: Diagnosis not present

## 2019-03-13 DIAGNOSIS — Z992 Dependence on renal dialysis: Secondary | ICD-10-CM | POA: Diagnosis not present

## 2019-03-15 DIAGNOSIS — N186 End stage renal disease: Secondary | ICD-10-CM | POA: Diagnosis not present

## 2019-03-15 DIAGNOSIS — Z23 Encounter for immunization: Secondary | ICD-10-CM | POA: Diagnosis not present

## 2019-03-15 DIAGNOSIS — Z992 Dependence on renal dialysis: Secondary | ICD-10-CM | POA: Diagnosis not present

## 2019-03-17 DIAGNOSIS — N186 End stage renal disease: Secondary | ICD-10-CM | POA: Diagnosis not present

## 2019-03-17 DIAGNOSIS — Z23 Encounter for immunization: Secondary | ICD-10-CM | POA: Diagnosis not present

## 2019-03-17 DIAGNOSIS — Z992 Dependence on renal dialysis: Secondary | ICD-10-CM | POA: Diagnosis not present

## 2019-03-20 ENCOUNTER — Encounter (HOSPITAL_COMMUNITY): Payer: Self-pay | Admitting: Surgery

## 2019-03-20 ENCOUNTER — Other Ambulatory Visit: Payer: Self-pay

## 2019-03-20 ENCOUNTER — Encounter (HOSPITAL_COMMUNITY): Payer: Medicare PPO

## 2019-03-20 ENCOUNTER — Ambulatory Visit: Payer: Medicare PPO | Admitting: Family

## 2019-03-20 ENCOUNTER — Other Ambulatory Visit (HOSPITAL_COMMUNITY): Payer: Medicare PPO

## 2019-03-20 DIAGNOSIS — Z992 Dependence on renal dialysis: Secondary | ICD-10-CM | POA: Diagnosis not present

## 2019-03-20 DIAGNOSIS — N186 End stage renal disease: Secondary | ICD-10-CM | POA: Diagnosis not present

## 2019-03-20 DIAGNOSIS — N185 Chronic kidney disease, stage 5: Secondary | ICD-10-CM

## 2019-03-20 DIAGNOSIS — Z23 Encounter for immunization: Secondary | ICD-10-CM | POA: Diagnosis not present

## 2019-03-21 ENCOUNTER — Other Ambulatory Visit (HOSPITAL_COMMUNITY): Payer: Medicare PPO

## 2019-03-21 ENCOUNTER — Other Ambulatory Visit: Payer: Self-pay

## 2019-03-21 ENCOUNTER — Encounter (HOSPITAL_COMMUNITY): Payer: Medicare PPO

## 2019-03-21 ENCOUNTER — Ambulatory Visit: Payer: Medicare PPO | Admitting: Family

## 2019-03-21 DIAGNOSIS — N185 Chronic kidney disease, stage 5: Secondary | ICD-10-CM

## 2019-03-22 ENCOUNTER — Telehealth (HOSPITAL_COMMUNITY): Payer: Self-pay | Admitting: Rehabilitation

## 2019-03-22 DIAGNOSIS — N186 End stage renal disease: Secondary | ICD-10-CM | POA: Diagnosis not present

## 2019-03-22 DIAGNOSIS — Z23 Encounter for immunization: Secondary | ICD-10-CM | POA: Diagnosis not present

## 2019-03-22 DIAGNOSIS — Z992 Dependence on renal dialysis: Secondary | ICD-10-CM | POA: Diagnosis not present

## 2019-03-22 NOTE — Telephone Encounter (Signed)
The above patient or their representative was contacted and gave the following answers to these questions:         Do you have any of the following symptoms? No  Fever                    Cough                   Shortness of breath  Do  you have any of the following other symptoms? No   muscle pain         vomiting,        diarrhea        rash         weakness        red eye        abdominal pain         bruising          bruising or bleeding              joint pain           severe headache    Have you been in contact with someone who was or has been sick in the past 2 weeks? No  Yes                 Unsure                         Unable to assess   Does the person that you were in contact with have any of the following symptoms?   Cough         shortness of breath           muscle pain         vomiting,            diarrhea            rash            weakness           fever            red eye           abdominal pain           bruising  or  bleeding                joint pain                severe headache               Have you  or someone you have been in contact with traveled internationally in th last month? No        If yes, which countries?   Have you  or someone you have been in contact with traveled outside [place] in th last month? No         If yes, which state and city?   COMMENTS OR ACTION PLAN FOR THIS PATIENT:          

## 2019-03-23 ENCOUNTER — Other Ambulatory Visit (HOSPITAL_COMMUNITY): Payer: Medicare PPO

## 2019-03-23 ENCOUNTER — Other Ambulatory Visit: Payer: Self-pay

## 2019-03-23 ENCOUNTER — Ambulatory Visit (HOSPITAL_COMMUNITY)
Admission: RE | Admit: 2019-03-23 | Discharge: 2019-03-23 | Disposition: A | Discharge status: Home / Self Care | Payer: Medicare PPO | Source: Ambulatory Visit | Attending: Family | Admitting: Family

## 2019-03-23 ENCOUNTER — Ambulatory Visit (INDEPENDENT_AMBULATORY_CARE_PROVIDER_SITE_OTHER): Payer: Self-pay | Admitting: Family

## 2019-03-23 ENCOUNTER — Ambulatory Visit: Payer: Medicare PPO | Admitting: Family

## 2019-03-23 ENCOUNTER — Ambulatory Visit (INDEPENDENT_AMBULATORY_CARE_PROVIDER_SITE_OTHER)
Admission: RE | Admit: 2019-03-23 | Discharge: 2019-03-23 | Disposition: A | Discharge status: Home / Self Care | Payer: Medicare PPO | Source: Ambulatory Visit | Attending: Family | Admitting: Family

## 2019-03-23 ENCOUNTER — Other Ambulatory Visit: Payer: Self-pay | Admitting: *Deleted

## 2019-03-23 ENCOUNTER — Encounter (HOSPITAL_COMMUNITY): Payer: Medicare PPO

## 2019-03-23 ENCOUNTER — Encounter: Payer: Self-pay | Admitting: *Deleted

## 2019-03-23 ENCOUNTER — Encounter: Payer: Self-pay | Admitting: Family

## 2019-03-23 VITALS — BP 131/83 | HR 94 | Temp 98.3°F | Resp 14 | Ht 67.0 in | Wt 157.0 lb

## 2019-03-23 DIAGNOSIS — Z992 Dependence on renal dialysis: Secondary | ICD-10-CM | POA: Diagnosis not present

## 2019-03-23 DIAGNOSIS — N186 End stage renal disease: Secondary | ICD-10-CM

## 2019-03-23 DIAGNOSIS — N185 Chronic kidney disease, stage 5: Secondary | ICD-10-CM | POA: Diagnosis not present

## 2019-03-23 DIAGNOSIS — T82898A Other specified complication of vascular prosthetic devices, implants and grafts, initial encounter: Secondary | ICD-10-CM

## 2019-03-23 NOTE — Progress Notes (Signed)
CC: silent right UE AVF s/p first stage right basilic vein fistula creation on 03-07-19, hx thrombosed accesses in left UE  History of Present Illness  [name] is a 68 y.o. ([birth year]) female who is s/p first stage right basilic vein fistula creation on 03-07-19 by Dr. Trula Slade.  The patient had a healthy appearing basilic vein on ultrasound.Brachial artery was heavily calcified The patient had failed access in the left arm.  Preoperative venogram suggested no central stenosis on the right.   Pt returns today after phone call received from Erasmo Downer at the [facility] on 03-10-19 to state that patient was there that day and they listened to her AVF and there was no bruit or thrill. She had just had it placed on the 14th of this month and they found that concerning and wanted to know what we should do.  Dr. Donzetta Matters weighed in, and given patient has catheter that can be used for her dialysis, pt scheduled to follow up in the office for UE art and Vein mapping as well as a visit.   She denies any steal type sx's in either upper extremity. She does report "a throbbing tingling" in her left forearm, along a vein that she feels is in a vein; this started 1-2 days ago, is improving with the application of a heating pad or hot water. She denies any dyspnea.   Bilateral upper extremity venogram on 10-27-18 by Dr. Carlis Abbott for thrombosed left brachial arterytoaxillary vein AV graft.  FINDING(S): 1. The left upper extremity brachax graft is thrombosed. Contrast in the left upper extremity was very sluggish to empty from the left arm and became very stagnant in the central venous system. Had difficulty identifying contrast emptying into the right atrium definitivelyalthough no overt stenosis was identified once contrast reached the axillaryveinand further centrally. There were no superficial usable veins in the left arm. 2. The right upper extremity venous system emptiedmuch more briskly even in  the setting of a right IJ tunneledcatheter. There was no evidence of central stenosis. The brachial veinswere stenosed in the mid upper arm but the proximal brachial vein and axillary vein were widely patent and no overt other central stenosis.  Plan:Would favor right upper extremity graft. Both the patient's left upper arm graft and left forearm graft havethrombosed after only 3 weeks and contrast was very sluggish emptying from the left upper extremity suggesting a severe outflow problem. We did not identify adefinitive central stenosis from the left but did have trouble confirming contrast was completely emptying into the right atrium from the left armsuggesting an underlying issue.   Before the venogram she was s/p Left arm brachial to axillary AV graft with 4-7 mm PTFE on 09-29-18 by Dr. Donzetta Matters. She had previously underwent placement of a left arm AV graft and a forearm loop configuration. That had subsequent thrombosed.  She dialyzes MWF via [place] at right side of chest, at [facility] in [place].    She reports pain in her knees with walking and with a change in weather, denies pain in her calves or feet with walking, denies non healing wounds in her feet or legs.   She is right hand dominant.    Past Medical History:  Diagnosis Date  . Arthritis    hands  . Chronic kidney disease    Stage 5 Dialysis M/W/F  . Constipation   . Diabetes mellitus without complication ([place])   . GERD (gastroesophageal reflux disease)   . Headache   .  Hyperlipidemia   . Hypertension   . Irregular heart rate   . Stroke [facility])    TIA 10 years ago    Social History Social History   Tobacco Use  . Smoking status: Never Smoker  . Smokeless tobacco: Never Used  Substance Use Topics  . Alcohol use: Never    Frequency: Never  . Drug use: Never    Family History Family History  Problem Relation Age of Onset  . Heart murmur Mother   . Heart failure Mother   . Diabetes Mother   . Cirrhosis  Father   . Alcoholism Father     Surgical History Past Surgical History:  Procedure Laterality Date  . ABDOMINAL HYSTERECTOMY    . AV FISTULA PLACEMENT Left 06/13/2018   Procedure: ARTERIOVENOUS (AV) FISTULA CREATION;  Surgeon: Rosetta Posner, MD;  Location: [facility] OR;  Service: Vascular;  Laterality: Left;  . AV FISTULA PLACEMENT Left 08/18/2018   Procedure: INSERTION OF 4-7MM X 45CM ARTERIOVENOUS (AV) GORE-TEX GRAFT LEFT  FOREARM;  Surgeon: Rosetta Posner, MD;  Location: [place];  Service: Vascular;  Laterality: Left;  . BASCILIC VEIN TRANSPOSITION Right 03/07/2019   Procedure: First Stage Bascilic Vein Transposition Right Arm;  Surgeon: Serafina Mitchell, MD;  Location: [place];  Service: Vascular;  Laterality: Right;  . INSERTION OF DIALYSIS CATHETER N/A 08/18/2018   Procedure: INSERTION OF DIALYSIS CATHETER;  Surgeon: Rosetta Posner, MD;  Location: [place];  Service: Vascular;  Laterality: N/A;  . IR FLUORO GUIDE CV LINE RIGHT  12/06/2018  . IR PTA ADDL CENTRAL DIALYSIS SEG THRU DIALY CIRCUIT RIGHT Right 12/06/2018  . THROMBECTOMY AND REVISION OF ARTERIOVENTOUS (AV) GORETEX  GRAFT Left 09/29/2018   Procedure: INSERTION OF ARTERIOVENTOUS (AV) GORETEX  GRAFT ARM;  Surgeon: Waynetta Sandy, MD;  Location: [place];  Service: Vascular;  Laterality: Left;  Marland Kitchen VENOGRAM  10/27/2018   Procedure: Venogram;  Surgeon: Marty Heck, MD;  Location: [place] CV LAB;  Service: Cardiovascular;;  bilateral arm    No Known Allergies  Current Outpatient Medications  Medication Sig Dispense Refill  . amLODipine (NORVASC) 10 MG tablet Take 10 mg by mouth daily at 3 pm. In the afternoon.    Marland Kitchen aspirin EC 81 MG tablet Take 81 mg by mouth daily.     . calcium carbonate (TUMS - DOSED IN MG ELEMENTAL CALCIUM) 500 MG chewable tablet Chew 1-2 tablets by mouth 2 (two) times daily as needed (acid reflux/indigestion.).     Marland Kitchen carvedilol (COREG) 6.25 MG tablet Take 6.25 mg by mouth 2 (two) times daily.    .  fluticasone (FLONASE) 50 MCG/ACT nasal spray Place 1 spray into both nostrils daily as needed for allergies or rhinitis.     Marland Kitchen insulin NPH-regular Human (NOVOLIN 70/30) (70-30) 100 UNIT/ML injection Inject 12-20 Units into the skin See admin instructions. Use twice daily per sliding scale: Blood sugar less than 150=12 units & Blood sugar 200 or more=20 units    . isosorbide mononitrate (IMDUR) 30 MG 24 hr tablet Take 30 mg by mouth daily at 3 pm. In the afternoon.    Marland Kitchen oxyCODONE-acetaminophen (PERCOCET/ROXICET) 5-325 MG tablet Take 1 tablet by mouth every 6 (six) hours as needed. (Patient not taking: Reported on 03/23/2019) 6 tablet 0   No current facility-administered medications for this visit.    Facility-Administered Medications Ordered in Other Visits  Medication Dose Route Frequency Provider Last Rate Last Dose  . 0.9 %  sodium chloride  infusion   Intravenous Continuous Monia Sabal, PA-C         REVIEW OF SYSTEMS: see HPI for pertinent positives and negatives    PHYSICAL EXAMINATION:  Vitals:   03/23/19 0914  BP: 131/83  Pulse: 94  Resp: 14  Temp: 98.3 F (36.8 C)  TempSrc: Oral  SpO2: 98%  Weight: 157 lb (71.2 kg)  Height: 5\' 7"  (1.702 m)   Body mass index is 24.59 kg/m.  General: The patient appears her stated age.   HEENT:  No gross abnormalities Pulmonary: Respirations are non-labored, CTAB, no rales, rhonchi, or wheezes. Good air movement in all fields.  Abdomen: Soft and non-tender with normal bowel sounds. Musculoskeletal: There are no major deformities.   Neurologic: No focal weakness or paresthesias are detected Skin: There are no ulcer or rashes noted. Psychiatric: The patient has normal affect. Cardiovascular: There is a regular rate and rhythm without significant murmur appreciated.  Bilateral radial pulses are 2+ palpable. Right IJ TDC noted.   At right upper arm AVF: no thrill palpated, no bruit auscultated. Unable to palpate a pulse at right upper  arm. Incision at right [facility] is well healed, some surgical glue remaining, mild soft swelling that is non tender, no erythema, no drainage, no fluctuance.     Non-Invasive Vascular Imaging  Bilateral arm Access Duplex  (Date: 03/23/2019):   Right Pre-Dialysis Findings: +-----------------------+----------+--------------------+---------+--------+ Location               PSV (cm/s)Intralum. Diam. (cm)Waveform Comments +-----------------------+----------+--------------------+---------+--------+ Brachial Antecub. fossa55        0.47                triphasic         +-----------------------+----------+--------------------+---------+--------+ Radial Art at Wrist    56        0.26                triphasic         +-----------------------+----------+--------------------+---------+--------+ Ulnar Art at Wrist     41        0.13                biphasic          +-----------------------+----------+--------------------+---------+--------+  +------------+----------------+ [facility] Test[facility] procedures +------------+----------------+  Left Pre-Dialysis Findings: +-----------------------+----------+--------------------+----------+---------+ Location               PSV (cm/s)Intralum. Diam. (cm)Waveform  Comments  +-----------------------+----------+--------------------+----------+---------+ Brachial Antecub. fossa67        0.60                triphasic           +-----------------------+----------+--------------------+----------+---------+ Radial Art at Wrist    68        0.26 / .21          triphasic           +-----------------------+----------+--------------------+----------+---------+ Ulnar Art at Wrist     22                            monophasicToo small +-----------------------+----------+--------------------+----------+---------+  +------------+----------------+ [facility] Testprior procedures +------------+----------------+ Summary:    Measurements above.   RUE Vein Mapping  (Date: 03/23/2019):  +-----------------+-------------+----------+-------------------+ Right Cephalic   Diameter (cm)Depth (cm)     Findings       +-----------------+-------------+----------+-------------------+ Shoulder             0.29                                   +-----------------+-------------+----------+-------------------+  Prox upper arm       0.32                                   +-----------------+-------------+----------+-------------------+ Mid upper arm     0.12 / 0.30                               +-----------------+-------------+----------+-------------------+ Dist upper arm       0.17                                   +-----------------+-------------+----------+-------------------+ Antecubital fossa    0.40               partial compression +-----------------+-------------+----------+-------------------+ Prox forearm         0.24                                   +-----------------+-------------+----------+-------------------+ Mid forearm          0.20                                   +-----------------+-------------+----------+-------------------+ Dist forearm         0.18                                   +-----------------+-------------+----------+-------------------+ Wrist             0.19 / 0.21                               +-----------------+-------------+----------+-------------------+  +-----------------+-------------+----------+--------------+ Right Basilic    Diameter (cm)Depth (cm)   Findings    +-----------------+-------------+----------+--------------+ Prox upper arm                          not visualized +-----------------+-------------+----------+--------------+ Mid upper arm     0.29 / 0.19               joins      +-----------------+-------------+----------+--------------+ Dist upper arm       0.14                               +-----------------+-------------+----------+--------------+ Antecubital fossa                       not visualized +-----------------+-------------+----------+--------------+ Prox forearm                            not visualized +-----------------+-------------+----------+--------------+ Summary: Right: Measurements above.    Medical Decision Making  [name] is a 68 y.o. female who is s/p  first stage right basilic vein fistula creation on 03-07-19 by Dr. Trula Slade.    She is also s/p thrombosed left upper arm and left forearm recent AV graft placements. She is currently dialyzed via [place] in her right IJ.   I spoke with Dr. Oneida Alar re pt HPI, physical exam results, and duplex results from today reviewed.  Dr. Oneida Alar spoke with and examined pt.  Her right arm AVF is occluded.  Will schedule right UE arteriogram on non HD day next week; pending results of this, plan right UE AV graft.   Since her left arm AV grafts are thrombosed, as confirmed on several studies, and on physical exam with not bruits and no thrills, there is no need for ligation of her AVG in the left arm, since they are thrombosed.  Therefore blood pressures may be freely checked in her left arm.  Clemon Chambers, RN, MSN, FNP-C [facility] [place] Office: [phone]  03/23/2019, 9:40 AM  Clinic MD: Oneida Alar

## 2019-03-24 DIAGNOSIS — N186 End stage renal disease: Secondary | ICD-10-CM | POA: Diagnosis not present

## 2019-03-24 DIAGNOSIS — Z992 Dependence on renal dialysis: Secondary | ICD-10-CM | POA: Diagnosis not present

## 2019-03-27 DIAGNOSIS — Z992 Dependence on renal dialysis: Secondary | ICD-10-CM | POA: Diagnosis not present

## 2019-03-27 DIAGNOSIS — N186 End stage renal disease: Secondary | ICD-10-CM | POA: Diagnosis not present

## 2019-03-28 ENCOUNTER — Other Ambulatory Visit: Payer: Self-pay

## 2019-03-28 ENCOUNTER — Ambulatory Visit (HOSPITAL_COMMUNITY)
Admission: RE | Admit: 2019-03-28 | Discharge: 2019-03-28 | Disposition: A | Discharge status: Home / Self Care | Payer: Medicare PPO | Attending: Vascular Surgery | Admitting: Vascular Surgery

## 2019-03-28 ENCOUNTER — Encounter (HOSPITAL_COMMUNITY)
Admission: RE | Disposition: A | Discharge status: Home / Self Care | Payer: Self-pay | Source: Home / Self Care | Attending: Vascular Surgery

## 2019-03-28 ENCOUNTER — Other Ambulatory Visit: Payer: Self-pay | Admitting: *Deleted

## 2019-03-28 ENCOUNTER — Encounter: Payer: Self-pay | Admitting: Vascular Surgery

## 2019-03-28 ENCOUNTER — Encounter (HOSPITAL_COMMUNITY): Payer: Self-pay | Admitting: Vascular Surgery

## 2019-03-28 ENCOUNTER — Other Ambulatory Visit: Payer: Self-pay | Admitting: Vascular Surgery

## 2019-03-28 DIAGNOSIS — K219 Gastro-esophageal reflux disease without esophagitis: Secondary | ICD-10-CM | POA: Diagnosis not present

## 2019-03-28 DIAGNOSIS — Z90710 Acquired absence of both cervix and uterus: Secondary | ICD-10-CM | POA: Diagnosis not present

## 2019-03-28 DIAGNOSIS — Y832 Surgical operation with anastomosis, bypass or graft as the cause of abnormal reaction of the patient, or of later complication, without mention of misadventure at the time of the procedure: Secondary | ICD-10-CM | POA: Diagnosis not present

## 2019-03-28 DIAGNOSIS — M19041 Primary osteoarthritis, right hand: Secondary | ICD-10-CM | POA: Insufficient documentation

## 2019-03-28 DIAGNOSIS — E1122 Type 2 diabetes mellitus with diabetic chronic kidney disease: Secondary | ICD-10-CM | POA: Insufficient documentation

## 2019-03-28 DIAGNOSIS — Q2549 Other congenital malformations of aorta: Secondary | ICD-10-CM | POA: Insufficient documentation

## 2019-03-28 DIAGNOSIS — Z8249 Family history of ischemic heart disease and other diseases of the circulatory system: Secondary | ICD-10-CM | POA: Insufficient documentation

## 2019-03-28 DIAGNOSIS — T82898A Other specified complication of vascular prosthetic devices, implants and grafts, initial encounter: Secondary | ICD-10-CM | POA: Diagnosis not present

## 2019-03-28 DIAGNOSIS — E785 Hyperlipidemia, unspecified: Secondary | ICD-10-CM | POA: Insufficient documentation

## 2019-03-28 DIAGNOSIS — Z79899 Other long term (current) drug therapy: Secondary | ICD-10-CM | POA: Diagnosis not present

## 2019-03-28 DIAGNOSIS — Z992 Dependence on renal dialysis: Secondary | ICD-10-CM | POA: Insufficient documentation

## 2019-03-28 DIAGNOSIS — N186 End stage renal disease: Secondary | ICD-10-CM | POA: Diagnosis not present

## 2019-03-28 DIAGNOSIS — Z794 Long term (current) use of insulin: Secondary | ICD-10-CM | POA: Insufficient documentation

## 2019-03-28 DIAGNOSIS — Z8673 Personal history of transient ischemic attack (TIA), and cerebral infarction without residual deficits: Secondary | ICD-10-CM | POA: Insufficient documentation

## 2019-03-28 DIAGNOSIS — M19042 Primary osteoarthritis, left hand: Secondary | ICD-10-CM | POA: Insufficient documentation

## 2019-03-28 DIAGNOSIS — Z833 Family history of diabetes mellitus: Secondary | ICD-10-CM | POA: Diagnosis not present

## 2019-03-28 DIAGNOSIS — Z7982 Long term (current) use of aspirin: Secondary | ICD-10-CM | POA: Insufficient documentation

## 2019-03-28 DIAGNOSIS — I120 Hypertensive chronic kidney disease with stage 5 chronic kidney disease or end stage renal disease: Secondary | ICD-10-CM | POA: Insufficient documentation

## 2019-03-28 HISTORY — PX: UPPER EXTREMITY ANGIOGRAPHY: CATH118270

## 2019-03-28 HISTORY — PX: AORTIC ARCH ANGIOGRAPHY: CATH118224

## 2019-03-28 LAB — POCT I-STAT 4, (NA,K, GLUC, HGB,HCT)
Glucose, Bld: 119 mg/dL — ABNORMAL HIGH (ref 70–99)
Glucose, Bld: 118 mg/dL — ABNORMAL HIGH (ref 70–99)
HCT: 40 % (ref 36.0–46.0)
HCT: 41 % (ref 36.0–46.0)
Hemoglobin: 13.6 g/dL (ref 12.0–15.0)
Hemoglobin: 13.9 g/dL (ref 12.0–15.0)
Potassium: 5.2 mmol/L — ABNORMAL HIGH (ref 3.5–5.1)
Potassium: 6.5 mmol/L (ref 3.5–5.1)
Sodium: 139 mmol/L (ref 135–145)
Sodium: 132 mmol/L — ABNORMAL LOW (ref 135–145)

## 2019-03-28 SURGERY — UPPER EXTREMITY ANGIOGRAPHY
Anesthesia: LOCAL | Laterality: Right

## 2019-03-28 MED ORDER — SODIUM CHLORIDE 0.9% FLUSH: 3.0000 mL | INTRAVENOUS | Status: DC | PRN

## 2019-03-28 MED ORDER — SODIUM CHLORIDE 0.9 % IV SOLN: 250.0000 mL | INTRAVENOUS | Status: DC | PRN

## 2019-03-28 MED ORDER — HYDRALAZINE HCL 20 MG/ML IJ SOLN: 5.0000 mg | INTRAMUSCULAR | Status: DC | PRN

## 2019-03-28 MED ORDER — ONDANSETRON HCL 4 MG/2ML IJ SOLN: 4.0000 mg | Freq: Four times a day (QID) | INTRAMUSCULAR | Status: DC | PRN

## 2019-03-28 MED ORDER — LIDOCAINE HCL (PF) 1 % IJ SOLN
INTRAMUSCULAR | Status: DC | PRN
  Administered 2019-03-28: 20 mL

## 2019-03-28 MED ORDER — SODIUM CHLORIDE 0.9% FLUSH: 3.0000 mL | Freq: Two times a day (BID) | INTRAVENOUS | Status: DC

## 2019-03-28 MED ORDER — ACETAMINOPHEN 325 MG PO TABS: 650.0000 mg | ORAL_TABLET | ORAL | Status: DC | PRN

## 2019-03-28 MED ORDER — LABETALOL HCL 5 MG/ML IV SOLN: 10.0000 mg | INTRAVENOUS | Status: DC | PRN

## 2019-03-28 MED ORDER — IODIXANOL 320 MG/ML IV SOLN
INTRAVENOUS | Status: DC | PRN
  Administered 2019-03-28: 45 mL via INTRA_ARTERIAL

## 2019-03-28 MED ORDER — HEPARIN (PORCINE) IN NACL 1000-0.9 UT/500ML-% IV SOLN
INTRAVENOUS | Status: DC | PRN
  Administered 2019-03-28 (×2): 500 mL

## 2019-03-28 SURGICAL SUPPLY — 12 items
CATH ANGIO 5F BER2 100CM (CATHETERS) ×3 IMPLANT
CATH ANGIO 5F PIGTAIL 100CM (CATHETERS) ×3 IMPLANT
CLOSURE MYNX CONTROL 5F (Vascular Products) ×3 IMPLANT
GUIDEWIRE ANGLED .035X260CM (WIRE) ×3 IMPLANT
KIT MICROPUNCTURE NIT STIFF (SHEATH) ×3 IMPLANT
KIT PV (KITS) ×3 IMPLANT
SHEATH PINNACLE 5F 10CM (SHEATH) ×3 IMPLANT
SHEATH PROBE COVER 6X72 (BAG) ×3 IMPLANT
SYR MEDRAD MARK V 150ML (SYRINGE) ×3 IMPLANT
TRANSDUCER W/STOPCOCK (MISCELLANEOUS) ×3 IMPLANT
TRAY PV CATH (CUSTOM PROCEDURE TRAY) ×3 IMPLANT
WIRE BENTSON .035X145CM (WIRE) ×3 IMPLANT

## 2019-03-28 NOTE — H&P (Signed)
   History and Physical Update  The patient was interviewed and re-examined.  The patient's previous History and Physical has been reviewed and is unchanged from recent office visit. Plan for right upper extremity angiogram to plan future hd access.   Lizette Pazos C. Donzetta Matters, MD [facility] [place] Office: [phone] Pager: [phone]   03/28/2019, 7:14 AM

## 2019-03-28 NOTE — Discharge Instructions (Signed)
Femoral Site Care °This sheet gives you information about how to care for yourself after your procedure. Your health care provider may also give you more specific instructions. If you have problems or questions, contact your health care provider. °What can I expect after the procedure? °After the procedure, it is common to have: °· Bruising that usually fades within 1-2 weeks. °· Tenderness at the site. °Follow these instructions at home: °Wound care °· Follow instructions from your health care provider about how to take care of your insertion site. Make sure you: °? Wash your hands with soap and water before you change your bandage (dressing). If soap and water are not available, use hand sanitizer. °? Change your dressing as told by your health care provider. °? Leave stitches (sutures), skin glue, or adhesive strips in place. These skin closures may need to stay in place for 2 weeks or longer. If adhesive strip edges start to loosen and curl up, you may trim the loose edges. Do not remove adhesive strips completely unless your health care provider tells you to do that. °· Do not take baths, swim, or use a hot tub until your health care provider approves. °· You may shower 24-48 hours after the procedure or as told by your health care provider. °? Gently wash the site with plain soap and water. °? Pat the area dry with a clean towel. °? Do not rub the site. This may cause bleeding. °· Do not apply powder or lotion to the site. Keep the site clean and dry. °· Check your femoral site every day for signs of infection. Check for: °? Redness, swelling, or pain. °? Fluid or blood. °? Warmth. °? Pus or a bad smell. °Activity °· For the first 2-3 days after your procedure, or as long as directed: °? Avoid climbing stairs as much as possible. °? Do not squat. °· Do not lift anything that is heavier than 10 lb (4.5 kg), or the limit that you are told, until your health care provider says that it is safe. °· Rest as  directed. °? Avoid sitting for a long time without moving. Get up to take short walks every 1-2 hours. °· Do not drive for 24 hours if you were given a medicine to help you relax (sedative). °General instructions °· Take over-the-counter and prescription medicines only as told by your health care provider. °· Keep all follow-up visits as told by your health care provider. This is important. °Contact a health care provider if you have: °· A fever or chills. °· You have redness, swelling, or pain around your insertion site. °Get help right away if: °· The catheter insertion area swells very fast. °· You pass out. °· You suddenly start to sweat or your skin gets clammy. °· The catheter insertion area is bleeding, and the bleeding does not stop when you hold steady pressure on the area. °· The area near or just beyond the catheter insertion site becomes pale, cool, tingly, or numb. °These symptoms may represent a serious problem that is an emergency. Do not wait to see if the symptoms will go away. Get medical help right away. Call your local emergency services (911 in the [place] Do not drive yourself to the hospital. °Summary °· After the procedure, it is common to have bruising that usually fades within 1-2 weeks. °· Check your femoral site every day for signs of infection. °· Do not lift anything that is heavier than 10 lb (4.5 kg), or the   limit that you are told, until your health care provider says that it is safe. °This information is not intended to replace advice given to you by your health care provider. Make sure you discuss any questions you have with your health care provider. °Document Released: 07/13/2014 Document Revised: 11/22/2017 Document Reviewed: 11/22/2017 °Elsevier Interactive Patient Education © 2019 [employer] Inc[employer] ° °

## 2019-03-28 NOTE — Op Note (Signed)
    Patient name: [name] MRN: [id number] DOB: 1951/08/04 Sex: female  03/28/2019 Pre-operative Diagnosis: End-stage renal disease Post-operative diagnosis:  Same Surgeon:  Erlene Quan C. Donzetta Matters, MD Procedure Performed: 1.  Ultrasound-guided cannulation right common femoral artery 2.  Arch aortogram 3.  Selection of right axillary artery and right upper extremity angiogram 4.  Minx device closure right common femoral artery  Indications: 68 year old female with end-stage renal disease currently on dialysis via right IJ catheter.  She has had multiple failed accesses in her bilateral upper extremities.  Most recently a basilic vein fistula worked for only 2 days despite appearing to be a suitable vein with patent central vasculature.  She is now indicated for angiogram of her right upper extremity to plan future access.  Findings: Patient appears to have a bovine arch which is nondiseased.  There appears to be no subclavian or axillary disease to the level of the elbow.  She has runoff to the hand only via the radial artery which is heavily diseased.  Patient will be planned for right upper arm AV graft possibly with brachial artery or axillary artery anastomosis.   Procedure:  The patient was identified in the holding area and taken to room 8.  The patient was then placed supine on the table and prepped and draped in the usual sterile fashion.  A time out was called.  Ultrasound was used to evaluate the right common femoral artery which was noted to be patent free of disease.  The area was anesthetized 1% lidocaine.  Was cannulated micropuncture needle followed by wire sheath.  This was done under direct ultrasound visualization and an image was saved the permanent record.  B[place]on wire was placed followed by 5 [place] sheath.  We placed the pigtail catheter in the arch of the aorta and performed arch aortogram.  A bare catheter was then used to select the subclavian artery along with a Glidewire.   Performed right upper extremity angiography.  Catheter was removed over wire.  Minx device was deployed in the right common femoral artery.  She tolerated procedure without immediate complication.  Contrast: 45cc   Minas Bonser C. Donzetta Matters, MD [facility] [place] Office: [phone] Pager: [phone]

## 2019-03-28 NOTE — Progress Notes (Signed)
D/c instructions given to sister, Nene, via telephone due to [place] visitor restrictions. No questions at this time and Nene verbalized understanding of instructions

## 2019-03-28 NOTE — Progress Notes (Signed)
No bleeding or hematoma noted after ambulation 

## 2019-03-29 DIAGNOSIS — Z992 Dependence on renal dialysis: Secondary | ICD-10-CM | POA: Diagnosis not present

## 2019-03-29 DIAGNOSIS — N186 End stage renal disease: Secondary | ICD-10-CM | POA: Diagnosis not present

## 2019-03-31 ENCOUNTER — Other Ambulatory Visit: Payer: Self-pay | Admitting: *Deleted

## 2019-03-31 DIAGNOSIS — N186 End stage renal disease: Secondary | ICD-10-CM | POA: Diagnosis not present

## 2019-03-31 DIAGNOSIS — Z992 Dependence on renal dialysis: Secondary | ICD-10-CM | POA: Diagnosis not present

## 2019-04-03 DIAGNOSIS — N186 End stage renal disease: Secondary | ICD-10-CM | POA: Diagnosis not present

## 2019-04-03 DIAGNOSIS — Z992 Dependence on renal dialysis: Secondary | ICD-10-CM | POA: Diagnosis not present

## 2019-04-04 ENCOUNTER — Other Ambulatory Visit: Payer: Self-pay

## 2019-04-04 ENCOUNTER — Encounter (HOSPITAL_COMMUNITY): Payer: Self-pay | Admitting: *Deleted

## 2019-04-04 NOTE — Progress Notes (Signed)
Ms Mooers denies chest pain or shortness of breath. Patient denies that she or her family has experienced any of the following: Cough Fever >100.4 Runny Nose Sore Throat Difficulty breathing/ shortness of breath Travel in past 14 days.  I instructed patient to take 70% of scheduled 70/30 Insulinon Wednesday evening. I instructed patient to nottake any 70/30 Insulin the day of surgery. I instructed patient to check CBG after awaking and every 2 hours until arrival  to the hospital.  I Instructed patient if CBG is less than 70 to drink  1/2 cup of a clear juice. Recheck CBG in 15 minutes then call pre- op desk at [phone] for further instructions.

## 2019-04-05 ENCOUNTER — Other Ambulatory Visit (HOSPITAL_COMMUNITY)
Admission: RE | Admit: 2019-04-05 | Discharge: 2019-04-05 | Disposition: A | Discharge status: Home / Self Care | Payer: Medicare PPO | Source: Ambulatory Visit | Attending: Vascular Surgery | Admitting: Vascular Surgery

## 2019-04-05 DIAGNOSIS — Z1159 Encounter for screening for other viral diseases: Secondary | ICD-10-CM | POA: Diagnosis not present

## 2019-04-05 DIAGNOSIS — N186 End stage renal disease: Secondary | ICD-10-CM | POA: Diagnosis not present

## 2019-04-05 DIAGNOSIS — Z992 Dependence on renal dialysis: Secondary | ICD-10-CM | POA: Diagnosis not present

## 2019-04-05 LAB — SARS CORONAVIRUS 2 BY RT PCR (HOSPITAL ORDER, PERFORMED IN ~~LOC~~ HOSPITAL LAB): SARS Coronavirus 2: NEGATIVE

## 2019-04-06 ENCOUNTER — Encounter (HOSPITAL_COMMUNITY)
Admission: RE | Disposition: A | Discharge status: Home / Self Care | Payer: Self-pay | Source: Home / Self Care | Attending: Vascular Surgery

## 2019-04-06 ENCOUNTER — Ambulatory Visit (HOSPITAL_COMMUNITY): Payer: Medicare PPO | Admitting: Anesthesiology

## 2019-04-06 ENCOUNTER — Other Ambulatory Visit: Payer: Self-pay

## 2019-04-06 ENCOUNTER — Ambulatory Visit (HOSPITAL_COMMUNITY)
Admission: RE | Admit: 2019-04-06 | Discharge: 2019-04-06 | Disposition: A | Discharge status: Home / Self Care | Payer: Medicare PPO | Attending: Vascular Surgery | Admitting: Vascular Surgery

## 2019-04-06 ENCOUNTER — Encounter (HOSPITAL_COMMUNITY): Payer: Self-pay

## 2019-04-06 DIAGNOSIS — Z7982 Long term (current) use of aspirin: Secondary | ICD-10-CM | POA: Diagnosis not present

## 2019-04-06 DIAGNOSIS — N185 Chronic kidney disease, stage 5: Secondary | ICD-10-CM | POA: Diagnosis not present

## 2019-04-06 DIAGNOSIS — Z7951 Long term (current) use of inhaled steroids: Secondary | ICD-10-CM | POA: Diagnosis not present

## 2019-04-06 DIAGNOSIS — Z8673 Personal history of transient ischemic attack (TIA), and cerebral infarction without residual deficits: Secondary | ICD-10-CM | POA: Diagnosis not present

## 2019-04-06 DIAGNOSIS — M1990 Unspecified osteoarthritis, unspecified site: Secondary | ICD-10-CM | POA: Insufficient documentation

## 2019-04-06 DIAGNOSIS — Z992 Dependence on renal dialysis: Secondary | ICD-10-CM | POA: Insufficient documentation

## 2019-04-06 DIAGNOSIS — K219 Gastro-esophageal reflux disease without esophagitis: Secondary | ICD-10-CM | POA: Diagnosis not present

## 2019-04-06 DIAGNOSIS — E1122 Type 2 diabetes mellitus with diabetic chronic kidney disease: Secondary | ICD-10-CM | POA: Diagnosis not present

## 2019-04-06 DIAGNOSIS — Z79899 Other long term (current) drug therapy: Secondary | ICD-10-CM | POA: Insufficient documentation

## 2019-04-06 DIAGNOSIS — N186 End stage renal disease: Secondary | ICD-10-CM | POA: Diagnosis not present

## 2019-04-06 DIAGNOSIS — E785 Hyperlipidemia, unspecified: Secondary | ICD-10-CM | POA: Insufficient documentation

## 2019-04-06 DIAGNOSIS — Z794 Long term (current) use of insulin: Secondary | ICD-10-CM | POA: Diagnosis not present

## 2019-04-06 DIAGNOSIS — I120 Hypertensive chronic kidney disease with stage 5 chronic kidney disease or end stage renal disease: Secondary | ICD-10-CM | POA: Insufficient documentation

## 2019-04-06 HISTORY — DX: End stage renal disease: N18.6

## 2019-04-06 HISTORY — PX: AV FISTULA PLACEMENT: SHX1204

## 2019-04-06 LAB — BASIC METABOLIC PANEL
Anion gap: 13 (ref 5–15)
BUN: 33 mg/dL — ABNORMAL HIGH (ref 8–23)
CO2: 24 mmol/L (ref 22–32)
Calcium: 8.6 mg/dL — ABNORMAL LOW (ref 8.9–10.3)
Chloride: 101 mmol/L (ref 98–111)
Creatinine, Ser: 5.45 mg/dL — ABNORMAL HIGH (ref 0.44–1.00)
GFR calc Af Amer: 9 mL/min — ABNORMAL LOW (ref >60)
GFR calc non Af Amer: 7 mL/min — ABNORMAL LOW (ref >60)
Glucose, Bld: 138 mg/dL — ABNORMAL HIGH (ref 70–99)
Potassium: 4.9 mmol/L (ref 3.5–5.1)
Sodium: 138 mmol/L (ref 135–145)

## 2019-04-06 LAB — GLUCOSE, CAPILLARY
Glucose-Capillary: 130 mg/dL — ABNORMAL HIGH (ref 70–99)
Glucose-Capillary: 123 mg/dL — ABNORMAL HIGH (ref 70–99)

## 2019-04-06 SURGERY — INSERTION OF ARTERIOVENOUS (AV) GORE-TEX GRAFT ARM
Anesthesia: Choice | Site: Arm Upper | Laterality: Right

## 2019-04-06 MED ORDER — MEPERIDINE HCL 25 MG/ML IJ SOLN: 6.2500 mg | INTRAMUSCULAR | Status: DC | PRN

## 2019-04-06 MED ORDER — PROPOFOL 500 MG/50ML IV EMUL
INTRAVENOUS | Status: DC | PRN
  Administered 2019-04-06: 75 ug/kg/min via INTRAVENOUS

## 2019-04-06 MED ORDER — FENTANYL CITRATE (PF) 250 MCG/5ML IJ SOLN
INTRAMUSCULAR | Status: AC
  Filled 2019-04-06: qty 5

## 2019-04-06 MED ORDER — FENTANYL CITRATE (PF) 100 MCG/2ML IJ SOLN: 25.0000 ug | INTRAMUSCULAR | Status: DC | PRN

## 2019-04-06 MED ORDER — MIDAZOLAM HCL 2 MG/2ML IJ SOLN
INTRAMUSCULAR | Status: AC
  Filled 2019-04-06: qty 2

## 2019-04-06 MED ORDER — PROPOFOL 10 MG/ML IV BOLUS
INTRAVENOUS | Status: AC
  Filled 2019-04-06: qty 20

## 2019-04-06 MED ORDER — MIDAZOLAM HCL 5 MG/5ML IJ SOLN
INTRAMUSCULAR | Status: DC | PRN
  Administered 2019-04-06: 1 mg via INTRAVENOUS

## 2019-04-06 MED ORDER — CEFAZOLIN SODIUM-DEXTROSE 2-4 GM/100ML-% IV SOLN
2.0000 g | INTRAVENOUS | Status: AC
  Administered 2019-04-06: 2 g via INTRAVENOUS
  Filled 2019-04-06: qty 100

## 2019-04-06 MED ORDER — PROPOFOL 10 MG/ML IV BOLUS
INTRAVENOUS | Status: DC | PRN
  Administered 2019-04-06 (×2): 20 mg via INTRAVENOUS

## 2019-04-06 MED ORDER — FENTANYL CITRATE (PF) 100 MCG/2ML IJ SOLN
INTRAMUSCULAR | Status: DC | PRN
  Administered 2019-04-06 (×2): 50 ug via INTRAVENOUS

## 2019-04-06 MED ORDER — 0.9 % SODIUM CHLORIDE (POUR BTL) OPTIME
TOPICAL | Status: DC | PRN
  Administered 2019-04-06: 09:00:00 1000 mL

## 2019-04-06 MED ORDER — LIDOCAINE HCL (PF) 1 % IJ SOLN
INTRAMUSCULAR | Status: AC
  Filled 2019-04-06: qty 30

## 2019-04-06 MED ORDER — LIDOCAINE-EPINEPHRINE (PF) 1 %-1:200000 IJ SOLN
INTRAMUSCULAR | Status: DC | PRN
  Administered 2019-04-06: 21 mL

## 2019-04-06 MED ORDER — EPHEDRINE SULFATE-NACL 50-0.9 MG/10ML-% IV SOSY
PREFILLED_SYRINGE | INTRAVENOUS | Status: DC | PRN
  Administered 2019-04-06: 15 mg via INTRAVENOUS

## 2019-04-06 MED ORDER — OXYCODONE-ACETAMINOPHEN 5-325 MG PO TABS: 1.0000 | ORAL_TABLET | Freq: Four times a day (QID) | ORAL | 0 refills | Status: DC | PRN

## 2019-04-06 MED ORDER — SODIUM CHLORIDE 0.9 % IV SOLN
INTRAVENOUS | Status: DC | PRN
  Administered 2019-04-06: 09:00:00

## 2019-04-06 MED ORDER — SODIUM CHLORIDE 0.9 % IV SOLN
INTRAVENOUS | Status: DC
  Administered 2019-04-06: 08:00:00 via INTRAVENOUS

## 2019-04-06 MED ORDER — SODIUM CHLORIDE 0.9 % IV SOLN
INTRAVENOUS | Status: AC
  Filled 2019-04-06: qty 1.2

## 2019-04-06 MED ORDER — PROMETHAZINE HCL 25 MG/ML IJ SOLN: 6.2500 mg | INTRAMUSCULAR | Status: DC | PRN

## 2019-04-06 SURGICAL SUPPLY — 38 items
ARMBAND PINK RESTRICT EXTREMIT (MISCELLANEOUS) ×4 IMPLANT
CANISTER SUCT 3000ML PPV (MISCELLANEOUS) ×2 IMPLANT
CLIP VESOCCLUDE MED 6/CT (CLIP) ×2 IMPLANT
CLIP VESOCCLUDE SM WIDE 6/CT (CLIP) ×2 IMPLANT
COVER WAND RF STERILE (DRAPES) ×2 IMPLANT
DERMABOND ADHESIVE PROPEN (GAUZE/BANDAGES/DRESSINGS) ×1
DERMABOND ADVANCED (GAUZE/BANDAGES/DRESSINGS) ×1
DERMABOND ADVANCED .7 DNX12 (GAUZE/BANDAGES/DRESSINGS) ×1 IMPLANT
DERMABOND ADVANCED .7 DNX6 (GAUZE/BANDAGES/DRESSINGS) ×1 IMPLANT
ELECT REM PT RETURN 9FT ADLT (ELECTROSURGICAL) ×2
ELECTRODE REM PT RTRN 9FT ADLT (ELECTROSURGICAL) ×1 IMPLANT
GLOVE BIO SURGEON STRL SZ7.5 (GLOVE) ×2 IMPLANT
GLOVE BIOGEL PI IND STRL 6.5 (GLOVE) ×1 IMPLANT
GLOVE BIOGEL PI IND STRL 7.5 (GLOVE) ×1 IMPLANT
GLOVE BIOGEL PI INDICATOR 6.5 (GLOVE) ×1
GLOVE BIOGEL PI INDICATOR 7.5 (GLOVE) ×1
GOWN STRL NON-REIN LRG LVL3 (GOWN DISPOSABLE) ×2 IMPLANT
GOWN STRL REUS W/ TWL LRG LVL3 (GOWN DISPOSABLE) ×2 IMPLANT
GOWN STRL REUS W/ TWL XL LVL3 (GOWN DISPOSABLE) ×1 IMPLANT
GOWN STRL REUS W/TWL LRG LVL3 (GOWN DISPOSABLE) ×2
GOWN STRL REUS W/TWL XL LVL3 (GOWN DISPOSABLE) ×1
GRAFT GORETEX STRT 4-7X45 (Vascular Products) ×2 IMPLANT
HEMOSTAT SNOW SURGICEL 2X4 (HEMOSTASIS) IMPLANT
INSERT FOGARTY SM (MISCELLANEOUS) ×2 IMPLANT
KIT BASIN OR (CUSTOM PROCEDURE TRAY) ×2 IMPLANT
KIT TURNOVER KIT B (KITS) ×2 IMPLANT
NS IRRIG 1000ML POUR BTL (IV SOLUTION) ×2 IMPLANT
PACK CV ACCESS (CUSTOM PROCEDURE TRAY) ×2 IMPLANT
PAD ARMBOARD 7.5X6 YLW CONV (MISCELLANEOUS) ×4 IMPLANT
SUT MNCRL AB 4-0 PS2 18 (SUTURE) IMPLANT
SUT PROLENE 6 0 BV (SUTURE) IMPLANT
SUT SILK 2 0 SH (SUTURE) IMPLANT
SUT VIC AB 3-0 SH 27 (SUTURE) ×2
SUT VIC AB 3-0 SH 27X BRD (SUTURE) ×2 IMPLANT
SYR TOOMEY 50ML (SYRINGE) IMPLANT
TOWEL GREEN STERILE (TOWEL DISPOSABLE) ×2 IMPLANT
UNDERPAD 30X30 (UNDERPADS AND DIAPERS) ×2 IMPLANT
WATER STERILE IRR 1000ML POUR (IV SOLUTION) ×2 IMPLANT

## 2019-04-06 NOTE — H&P (Signed)
HP    History of Present Illness: This is a 68 y.o. female previous right bvt has failed. More recently undergone upper extremity angiogram.  Past Medical History:  Diagnosis Date  . Arthritis    hands  . Constipation   . Diabetes mellitus without complication (HCC)    Type II  . ESRD (end stage renal disease) ([place])     M/W/F- Hemodialysis  . GERD (gastroesophageal reflux disease)   . Headache   . Hyperlipidemia   . Hypertension   . Irregular heart rate   . Stroke [facility])    TIA - approx 2010- no residual    Past Surgical History:  Procedure Laterality Date  . ABDOMINAL HYSTERECTOMY    . AORTIC ARCH ANGIOGRAPHY N/A 03/28/2019   Procedure: AORTIC ARCH ANGIOGRAPHY;  Surgeon: Waynetta Sandy, MD;  Location: [place] CV LAB;  Service: Cardiovascular;  Laterality: N/A;  . AV FISTULA PLACEMENT Left 06/13/2018   Procedure: ARTERIOVENOUS (AV) FISTULA CREATION;  Surgeon: Rosetta Posner, MD;  Location: [facility] OR;  Service: Vascular;  Laterality: Left;  . AV FISTULA PLACEMENT Left 08/18/2018   Procedure: INSERTION OF 4-7MM X 45CM ARTERIOVENOUS (AV) GORE-TEX GRAFT LEFT  FOREARM;  Surgeon: Rosetta Posner, MD;  Location: [place];  Service: Vascular;  Laterality: Left;  . BASCILIC VEIN TRANSPOSITION Right 03/07/2019   Procedure: First Stage Bascilic Vein Transposition Right Arm;  Surgeon: Serafina Mitchell, MD;  Location: [place];  Service: Vascular;  Laterality: Right;  . CATARACT EXTRACTION Right 2005  . INSERTION OF DIALYSIS CATHETER N/A 08/18/2018   Procedure: INSERTION OF DIALYSIS CATHETER;  Surgeon: Rosetta Posner, MD;  Location: [place];  Service: Vascular;  Laterality: N/A;  . IR FLUORO GUIDE CV LINE RIGHT  12/06/2018  . IR PTA ADDL CENTRAL DIALYSIS SEG THRU DIALY CIRCUIT RIGHT Right 12/06/2018  . THROMBECTOMY AND REVISION OF ARTERIOVENTOUS (AV) GORETEX  GRAFT Left 09/29/2018   Procedure: INSERTION OF ARTERIOVENTOUS (AV) GORETEX  GRAFT ARM;  Surgeon: Waynetta Sandy, MD;  Location:  [place];  Service: Vascular;  Laterality: Left;  . UPPER EXTREMITY ANGIOGRAPHY Right 03/28/2019   Procedure: UPPER EXTREMITY ANGIOGRAPHY;  Surgeon: Waynetta Sandy, MD;  Location: [place] [facility] LAB;  Service: Cardiovascular;  Laterality: Right;  . VENOGRAM  10/27/2018   Procedure: Venogram;  Surgeon: Marty Heck, MD;  Location: [place] [facility];  Service: Cardiovascular;;  bilateral arm    No Known Allergies  Prior to Admission medications   Medication Sig Start Date End Date Taking? Authorizing Provider  amLODipine (NORVASC) 10 MG tablet Take 10 mg by mouth daily at 3 pm. In the afternoon.   Yes [provider]  aspirin EC 81 MG tablet Take 81 mg by mouth daily.    Yes [provider]  calcium carbonate (TUMS - DOSED IN MG ELEMENTAL CALCIUM) 500 MG chewable tablet Chew 1 tablet by mouth 2 (two) times daily.    Yes [provider]  carvedilol (COREG) 6.25 MG tablet Take 6.25 mg by mouth 2 (two) times daily. 02/01/19  Yes [provider]  fluticasone (FLONASE) 50 MCG/ACT nasal spray Place 1 spray into both nostrils daily as needed for allergies or rhinitis.    Yes [provider]  insulin NPH-regular Human (NOVOLIN 70/30) (70-30) 100 UNIT/ML injection Inject 12-20 Units into the skin See admin instructions. Use twice daily per sliding scale: Blood sugar less than 150=12 units & Blood sugar 200 or more=20 units   Yes [provider]  isosorbide mononitrate (IMDUR) 30 MG 24 hr tablet Take 30 mg by mouth daily at 3 pm. In the afternoon.   Yes [provider]  oxyCODONE-acetaminophen (PERCOCET/ROXICET) 5-325 MG tablet Take 1 tablet by mouth every 6 (six) hours as needed. Patient not taking: Reported on 03/23/2019 03/07/19   Gabriel Earing, PA-C    Social History   Socioeconomic History  . Marital status: Single    Spouse name: Not on file  . Number of children: Not on file  . Years of education: Not on file  .  Highest education level: Not on file  Occupational History  . Not on file  Social Needs  . Financial resource strain: Not on file  . Food insecurity:    Worry: Not on file    Inability: Not on file  . Transportation needs:    Medical: Not on file    Non-medical: Not on file  Tobacco Use  . Smoking status: Never Smoker  . Smokeless tobacco: Never Used  Substance and Sexual Activity  . Alcohol use: Never    Frequency: Never  . Drug use: Never  . Sexual activity: Not on file  Lifestyle  . Physical activity:    Days per week: Not on file    Minutes per session: Not on file  . Stress: Not on file  Relationships  . Social connections:    Talks on phone: Not on file    Gets together: Not on file    Attends religious service: Not on file    Active member of club or organization: Not on file    Attends meetings of clubs or organizations: Not on file    Relationship status: Not on file  . Intimate partner violence:    Fear of current or ex partner: Not on file    Emotionally abused: Not on file    Physically abused: Not on file    Forced sexual activity: Not on file  Other Topics Concern  . Not on file  Social History Narrative  . Not on file    Family History  Problem Relation Age of Onset  . Heart murmur Mother   . Heart failure Mother   . Diabetes Mother   . Cirrhosis Father   . Alcoholism Father     ROS: No complaints today   Physical Examination  Vitals:   04/06/19 0728  BP: (!) 150/84  Pulse: 82  Resp: 17  Temp: 98.7 F (37.1 C)  SpO2: 100%   Body mass index is 24.75 kg/m.  General:  nad Pulmonary: normal non-labored breathing Cardiac:palpable right radial and brachial pulses Extremities: healing right upper arm incision Neurologic: A&O X 3  CBC    Component Value Date/Time   HGB 12.9 04/06/2019 0748   HCT 38.0 04/06/2019 0748    BMET    Component Value Date/Time   NA 135 04/06/2019 0748   K 6.6 (HH) 04/06/2019 0748   CL 107  10/27/2018 0619   GLUCOSE 134 (H) 04/06/2019 0748   BUN 19 10/27/2018 0619   CREATININE 3.70 (H) 10/27/2018 0619    COAGS: No results found for: INR, PROTIME    ASSESSMENT/PLAN: This is a 68 y.o. female with esrd. Plan for right upper arm av graft today in OR. I have discussed risks and benefits and she agrees to proceed.    C. Donzetta Matters, MD [facility] [place] Office: [phone] Pager: [phone]

## 2019-04-06 NOTE — Progress Notes (Addendum)
BMP drawn: K+ 4.9  Dr. Lissa Hoard notified

## 2019-04-06 NOTE — Op Note (Signed)
° ° °  Patient name: [name] MRN: [id number] DOB: 1951/04/30 Sex: female  04/06/2019 Pre-operative Diagnosis: End-stage renal disease Post-operative diagnosis:  Same Surgeon:  Erlene Quan C. Donzetta Matters, MD Assistant: Leontine Locket, PA Procedure Performed:  Right upper extremity brachial to axillary 4-7 mm PTFE graft  Indications: 68 year old female with previous failed access procedures.  Most recently she had a right brachial to basilic vein fistula that failed.  She is currently on dialysis via catheter.  She has undergone right upper extremity angiogram without any fixable lesions.  She is now indicated for right upper extremity graft.  Findings: The right brachial artery was calcified but there was an area and soft for clamping.  Axillary vein was large amenable for anastomosis.  At completion there was a strong thrill in the graft palpable radial pulse at the wrist.   Procedure:  The patient was identified in the holding area and taken to the operating room was placed supine operative MAC anesthesia induced.  She was sterilely prepped in her right upper extremity given antibiotics and a timeout was called.  We began using ultrasound to identify her brachial artery which did appear to be soft enough at least to attempt anastomosis.  We also identified her axillary vein.  The skin and soft tissue overlying these 2 vessels was anesthetized 1% lidocaine plain as well as a tunnel tract.  We then made incision overlying the brachial artery dissected this out it did appear soft enough for anastomosis.  We then made an incision in the axilla dissected down identified the axillary vein placed a vessel loop around this.  A 4-7 mm graft was then tunneled between the 2.  Axillary vein was clamped distally proximally opened longitudinally.  The graft was trimmed to size and spatulated and sewn end-to-side with 5-0 Prolene suture.  On completion we then flushed through the graft.  Turned our attention towards the  arterial anastomosis.  The graft was trimmed to size probably about 5 mm at that level.  The artery was clamped distally and proximally and opened longitudinally flushed with heparinized saline both directions.  The graft was then sewn end-to-side with 6-0 Prolene suture.  This time prior to the anastomosis well flushing in all directions and irrigated with heparinized saline.  Upon completion there was a strong thrill in the graft.  There is palpable radial pulse.  Both these were confirmed with Doppler.  Satisfied we obtain hemostasis in the wounds.  We irrigated with saline closed in layers Vicryl Monocryl.  Dermabond placed to level skin.  She is awake and anesthesia having tolerated procedure without immediate complication.  Counts were correct at completion.  EBL: 50 cc   Marya Lowden C. Donzetta Matters, MD [facility] [place] Office: [phone] Pager: [phone]

## 2019-04-06 NOTE — Progress Notes (Signed)
Istat drawn  K+ 6.6  Dr. Lissa Hoard notified, draw BMP to repeat labs. Pt hooked up to monitor.   Possible cancellation, per Dr. Lissa Hoard.

## 2019-04-06 NOTE — Anesthesia Preprocedure Evaluation (Signed)
Anesthesia Evaluation  Patient identified by MRN, date of birth, ID band Patient awake    Reviewed: Allergy & Precautions, H&P , NPO status , Patient's Chart, lab work & pertinent test results, reviewed documented beta blocker date and time   Airway Mallampati: II  TM Distance: >3 FB Neck ROM: Full    Dental no notable dental hx. (+) Edentulous Upper, Partial Lower, Dental Advisory Given   Pulmonary neg pulmonary ROS,    Pulmonary exam normal breath sounds clear to auscultation       Cardiovascular Exercise Tolerance: Good hypertension, Pt. on medications and Pt. on home beta blockers  Rhythm:Regular Rate:Normal     Neuro/Psych  Headaches, CVA, No Residual Symptoms negative psych ROS   GI/Hepatic Neg liver ROS, GERD  Controlled,  Endo/Other  diabetes, Insulin Dependent  Renal/GU ESRF and DialysisRenal disease  negative genitourinary   Musculoskeletal  (+) Arthritis , Osteoarthritis,    Abdominal   Peds  Hematology negative hematology ROS (+)   Anesthesia Other Findings   Reproductive/Obstetrics negative OB ROS                             Anesthesia Physical  Anesthesia Plan  ASA: III  Anesthesia Plan:    Post-op Pain Management:    Induction: Intravenous  PONV Risk Score and Plan: 4 or greater and Ondansetron, Dexamethasone and Midazolam  Airway Management Planned:   Additional Equipment:   Intra-op Plan:   Post-operative Plan:   Informed Consent: I have reviewed the patients History and Physical, chart, labs and discussed the procedure including the risks, benefits and alternatives for the proposed anesthesia with the patient or authorized representative who has indicated his/her understanding and acceptance.     Dental advisory given  Plan Discussed with: CRNA  Anesthesia Plan Comments:         Anesthesia Quick Evaluation

## 2019-04-06 NOTE — Transfer of Care (Signed)
Immediate Anesthesia Transfer of Care Note  Patient: [name]  Procedure(s) Performed: INSERTION OF ARTERIOVENOUS (AV) GORE-TEX GRAFT RIGHT UPPER ARM (Right Arm Upper)  Patient Location: PACU  Anesthesia Type:MAC  Level of Consciousness: drowsy and patient cooperative  Airway & Oxygen Therapy: Patient Spontanous Breathing and Patient connected to nasal cannula oxygen  Post-op Assessment: Report given to RN, Post -op Vital signs reviewed and stable and Patient moving all extremities  Post vital signs: Reviewed and stable  Last Vitals:  Vitals Value Taken Time  BP 119/75 04/06/2019 10:00 AM  Temp    Pulse 100 04/06/2019 10:02 AM  Resp 22 04/06/2019 10:02 AM  SpO2 100 % 04/06/2019 10:02 AM  Vitals shown include unvalidated device data.  Last Pain:  Vitals:   04/06/19 0750  TempSrc:   PainSc: 0-No pain         Complications: No apparent anesthesia complications

## 2019-04-06 NOTE — Discharge Instructions (Signed)
° °  [facility]  Discharge Instructions  AV Fistula or Graft Surgery for Dialysis Access  Please refer to the following instructions for your post-procedure care. Your surgeon or physician assistant will discuss any changes with you.  Activity  You may drive the day following your surgery, if you are comfortable and no longer taking prescription pain medication. Resume full activity as the soreness in your incision resolves.  Bathing/Showering  You may shower after you go home. Keep your incision dry for 48 hours. Do not soak in a bathtub, hot tub, or swim until the incision heals completely. You may not shower if you have a hemodialysis catheter.  Incision Care  Clean your incision with mild soap and water after 48 hours. Pat the area dry with a clean towel. You do not need a bandage unless otherwise instructed. Do not apply any ointments or creams to your incision. You may have skin glue on your incision. Do not peel it off. It will come off on its own in about one week. Your arm may swell a bit after surgery. To reduce swelling use pillows to elevate your arm so it is above your heart. Your doctor will tell you if you need to lightly wrap your arm with an ACE bandage.  Diet  Resume your normal diet. There are not special food restrictions following this procedure. In order to heal from your surgery, it is CRITICAL to get adequate nutrition. Your body requires vitamins, minerals, and protein. Vegetables are the best source of vitamins and minerals. Vegetables also provide the perfect balance of protein. Processed food has little nutritional value, so try to avoid this.  Medications  Resume taking all of your medications. If your incision is causing pain, you may take over-the counter pain relievers such as acetaminophen (Tylenol). If you were prescribed a stronger pain medication, please be aware these medications can cause nausea and constipation. Prevent  nausea by taking the medication with a snack or meal. Avoid constipation by drinking plenty of fluids and eating foods with high amount of fiber, such as fruits, vegetables, and grains.  Do not take Tylenol if you are taking prescription pain medications.  Follow up Your surgeon may want to see you in the office following your access surgery. If so, this will be arranged at the time of your surgery.  Please call us immediately for any of the following conditions:  Increased pain, redness, drainage (pus) from your incision site Fever of 101 degrees or higher Severe or worsening pain at your incision site Hand pain or numbness.  Reduce your risk of vascular disease:  Stop smoking. If you would like help, call [facility] at 1-800-QUIT-NOW 959 847 4217) or [place] at [place] your cholesterol Maintain a desired weight Control your diabetes Keep your blood pressure down  Dialysis  It will take several weeks to several months for your new dialysis access to be ready for use. Your surgeon will determine when it is okay to use it. Your nephrologist will continue to direct your dialysis. You can continue to use your Permcath until your new access is ready for use.   04/06/2019 [name] [id number] 05-24-1951  Surgeon(s): Waynetta Sandy, MD  Procedure(s): INSERTION OF ARTERIOVENOUS (AV) GORE-TEX GRAFT RIGHT UPPER ARM  x Do not stick graft for 4 weeks    If you have any questions, please call the office at [phone].

## 2019-04-07 ENCOUNTER — Telehealth (HOSPITAL_COMMUNITY): Payer: Self-pay | Admitting: *Deleted

## 2019-04-07 ENCOUNTER — Encounter (HOSPITAL_COMMUNITY): Payer: Self-pay | Admitting: Vascular Surgery

## 2019-04-07 DIAGNOSIS — N186 End stage renal disease: Secondary | ICD-10-CM | POA: Diagnosis not present

## 2019-04-07 DIAGNOSIS — Z992 Dependence on renal dialysis: Secondary | ICD-10-CM | POA: Diagnosis not present

## 2019-04-07 NOTE — Telephone Encounter (Signed)
The above patient or their representative was contacted and gave the following answers to these questions:         Do you have any of the following symptoms?  Fever                    Cough                   Shortness of breath  Do  you have any of the following other symptoms?    muscle pain         vomiting,        diarrhea        rash         weakness        red eye        abdominal pain         bruising          bruising or bleeding              joint pain           severe headache    Have you been in contact with someone who was or has been sick in the past 2 weeks?  Yes                 Unsure                         Unable to assess   Does the person that you were in contact with have any of the following symptoms?   Cough         shortness of breath           muscle pain         vomiting,            diarrhea            rash            weakness           fever            red eye           abdominal pain           bruising  or  bleeding                joint pain                severe headache               Have you  or someone you have been in contact with traveled internationally in th last month?         If yes, which countries?   Have you  or someone you have been in contact with traveled outside [place] in th last month?         If yes, which state and city?   COMMENTS OR ACTION PLAN FOR THIS PATIENT:

## 2019-04-10 ENCOUNTER — Ambulatory Visit (INDEPENDENT_AMBULATORY_CARE_PROVIDER_SITE_OTHER): Payer: Self-pay | Admitting: Surgery

## 2019-04-10 ENCOUNTER — Encounter: Payer: Self-pay | Admitting: Surgery

## 2019-04-10 ENCOUNTER — Ambulatory Visit (HOSPITAL_COMMUNITY)
Admission: RE | Admit: 2019-04-10 | Discharge: 2019-04-10 | Disposition: A | Discharge status: Home / Self Care | Payer: Medicare PPO | Source: Ambulatory Visit | Attending: Surgery | Admitting: Surgery

## 2019-04-10 ENCOUNTER — Other Ambulatory Visit: Payer: Self-pay | Admitting: *Deleted

## 2019-04-10 ENCOUNTER — Other Ambulatory Visit: Payer: Self-pay

## 2019-04-10 ENCOUNTER — Encounter: Payer: Self-pay | Admitting: *Deleted

## 2019-04-10 VITALS — BP 127/89 | HR 96 | Temp 98.0°F | Resp 18 | Ht 67.0 in | Wt 158.7 lb

## 2019-04-10 DIAGNOSIS — N185 Chronic kidney disease, stage 5: Secondary | ICD-10-CM | POA: Insufficient documentation

## 2019-04-10 DIAGNOSIS — Z992 Dependence on renal dialysis: Secondary | ICD-10-CM | POA: Diagnosis not present

## 2019-04-10 DIAGNOSIS — N186 End stage renal disease: Secondary | ICD-10-CM | POA: Diagnosis not present

## 2019-04-10 LAB — POCT I-STAT 4, (NA,K, GLUC, HGB,HCT)
Glucose, Bld: 134 mg/dL — ABNORMAL HIGH (ref 70–99)
HCT: 38 % (ref 36.0–46.0)
Hemoglobin: 12.9 g/dL (ref 12.0–15.0)
Potassium: 6.6 mmol/L (ref 3.5–5.1)
Sodium: 135 mmol/L (ref 135–145)

## 2019-04-10 NOTE — H&P (View-Only) (Signed)
Patient name: [name] MRN: [id number] DOB: 10/09/1951 Sex: female  REASON FOR VISIT:     post op  HISTORY OF PRESENT ILLNESS:   [name] is a 68 y.o. female who has recently undergone access procedures in her right arm but have occluded.  She also had occlusions of multiple procedures in her left arm.  She is currently dialyzing through a catheter.  She has had a venogram as well as arterial evaluation with no obvious etiology for her occlusions.  She dialyzes on Monday Wednesday Friday  CURRENT MEDICATIONS:    Current Outpatient Medications  Medication Sig Dispense Refill  . amLODipine (NORVASC) 10 MG tablet Take 10 mg by mouth daily at 3 pm. In the afternoon.    Marland Kitchen aspirin EC 81 MG tablet Take 81 mg by mouth daily.     . calcium carbonate (TUMS - DOSED IN MG ELEMENTAL CALCIUM) 500 MG chewable tablet Chew 1 tablet by mouth 2 (two) times daily.     . carvedilol (COREG) 6.25 MG tablet Take 6.25 mg by mouth 2 (two) times daily.    . fluticasone (FLONASE) 50 MCG/ACT nasal spray Place 1 spray into both nostrils daily as needed for allergies or rhinitis.     Marland Kitchen insulin NPH-regular Human (NOVOLIN 70/30) (70-30) 100 UNIT/ML injection Inject 12-20 Units into the skin See admin instructions. Use twice daily per sliding scale: Blood sugar less than 150=12 units & Blood sugar 200 or more=20 units    . isosorbide mononitrate (IMDUR) 30 MG 24 hr tablet Take 30 mg by mouth daily at 3 pm. In the afternoon.    . multivitamin (RENA-VIT) TABS tablet Take 1 tablet by mouth See admin instructions.    Marland Kitchen oxyCODONE-acetaminophen (PERCOCET/ROXICET) 5-325 MG tablet Take 1 tablet by mouth every 6 (six) hours as needed. 20 tablet 0   No current facility-administered medications for this visit.    Facility-Administered Medications Ordered in Other Visits  Medication Dose Route Frequency Provider Last Rate Last Dose  . 0.9 %  sodium chloride infusion   Intravenous  Continuous Monia Sabal, PA-C        REVIEW OF SYSTEMS:   [X]  denotes positive finding, [ ]  denotes negative finding Cardiac  Comments:  Chest pain or chest pressure:    Shortness of breath upon exertion:    Short of breath when lying flat:    Irregular heart rhythm:    Constitutional    Fever or chills:      PHYSICAL EXAM:   Vitals:   04/10/19 1038  BP: 127/89  Pulse: 96  Resp: 18  Temp: 98 F (36.7 C)  SpO2: 98%  Weight: 72 kg  Height: 5\' 7"  (1.702 m)    GENERAL: The patient is a well-nourished female, in no acute distress. The vital signs are documented above. CARDIOVASCULAR: There is a regular rate and rhythm. PULMONARY: Non-labored respirations Occluded right arm access  STUDIES:   Right upper arm graft is occluded   MEDICAL ISSUES:   Given the challenges the patient has had with access, I am unsure as to the etiology.  Venogram shows the venous system to be widely patent on the right as well as angiographic evaluation of the arteries on the right which showed no stenosis.  I have recommended that we go back to the operating room for a redo right upper arm graft.  This time I would use a bovine graft.  I will also place the patient on 2.5 mg of Coumadin after  the procedure  Leia Alf, MD, FACS [facility] [phone] Pager [phone]

## 2019-04-10 NOTE — Progress Notes (Signed)
Patient agreed to self quarantine until surgery on 04/13/2019. Reviewed quarantine measures.

## 2019-04-10 NOTE — Progress Notes (Signed)
Patient name: [name] MRN: [id number] DOB: 1951/07/26 Sex: female  REASON FOR VISIT:     post op  HISTORY OF PRESENT ILLNESS:   [name] is a 68 y.o. female who has recently undergone access procedures in her right arm but have occluded.  She also had occlusions of multiple procedures in her left arm.  She is currently dialyzing through a catheter.  She has had a venogram as well as arterial evaluation with no obvious etiology for her occlusions.  She dialyzes on Monday Wednesday Friday  CURRENT MEDICATIONS:    Current Outpatient Medications  Medication Sig Dispense Refill  . amLODipine (NORVASC) 10 MG tablet Take 10 mg by mouth daily at 3 pm. In the afternoon.    Marland Kitchen aspirin EC 81 MG tablet Take 81 mg by mouth daily.     . calcium carbonate (TUMS - DOSED IN MG ELEMENTAL CALCIUM) 500 MG chewable tablet Chew 1 tablet by mouth 2 (two) times daily.     . carvedilol (COREG) 6.25 MG tablet Take 6.25 mg by mouth 2 (two) times daily.    . fluticasone (FLONASE) 50 MCG/ACT nasal spray Place 1 spray into both nostrils daily as needed for allergies or rhinitis.     Marland Kitchen insulin NPH-regular Human (NOVOLIN 70/30) (70-30) 100 UNIT/ML injection Inject 12-20 Units into the skin See admin instructions. Use twice daily per sliding scale: Blood sugar less than 150=12 units & Blood sugar 200 or more=20 units    . isosorbide mononitrate (IMDUR) 30 MG 24 hr tablet Take 30 mg by mouth daily at 3 pm. In the afternoon.    . multivitamin (RENA-VIT) TABS tablet Take 1 tablet by mouth See admin instructions.    Marland Kitchen oxyCODONE-acetaminophen (PERCOCET/ROXICET) 5-325 MG tablet Take 1 tablet by mouth every 6 (six) hours as needed. 20 tablet 0   No current facility-administered medications for this visit.    Facility-Administered Medications Ordered in Other Visits  Medication Dose Route Frequency Provider Last Rate Last Dose  . 0.9 %  sodium chloride infusion   Intravenous  Continuous Monia Sabal, PA-C        REVIEW OF SYSTEMS:   [X]  denotes positive finding, [ ]  denotes negative finding Cardiac  Comments:  Chest pain or chest pressure:    Shortness of breath upon exertion:    Short of breath when lying flat:    Irregular heart rhythm:    Constitutional    Fever or chills:      PHYSICAL EXAM:   Vitals:   04/10/19 1038  BP: 127/89  Pulse: 96  Resp: 18  Temp: 98 F (36.7 C)  SpO2: 98%  Weight: 72 kg  Height: 5\' 7"  (1.702 m)    GENERAL: The patient is a well-nourished female, in no acute distress. The vital signs are documented above. CARDIOVASCULAR: There is a regular rate and rhythm. PULMONARY: Non-labored respirations Occluded right arm access  STUDIES:   Right upper arm graft is occluded   MEDICAL ISSUES:   Given the challenges the patient has had with access, I am unsure as to the etiology.  Venogram shows the venous system to be widely patent on the right as well as angiographic evaluation of the arteries on the right which showed no stenosis.  I have recommended that we go back to the operating room for a redo right upper arm graft.  This time I would use a bovine graft.  I will also place the patient on 2.5 mg of Coumadin after  the procedure  Leia Alf, MD, FACS [facility] [phone] Pager [phone]

## 2019-04-12 ENCOUNTER — Other Ambulatory Visit: Payer: Self-pay

## 2019-04-12 ENCOUNTER — Encounter (HOSPITAL_COMMUNITY): Payer: Self-pay | Admitting: *Deleted

## 2019-04-12 DIAGNOSIS — N186 End stage renal disease: Secondary | ICD-10-CM | POA: Diagnosis not present

## 2019-04-12 DIAGNOSIS — Z992 Dependence on renal dialysis: Secondary | ICD-10-CM | POA: Diagnosis not present

## 2019-04-12 NOTE — Progress Notes (Signed)
Pt denies SOB, chest pain, and being under the care of a cardiologist.Pt denies having a cardiac cath but stated that a stress test was performed > 10 years ago. Pt made aware to stop taking vitamins, fish oiland herbal medications. Do not take any NSAIDs ie: Ibuprofen, Advil, Naproxen (Aleve), Motrin, BC and Goody Powder. Pt made aware to take only 70%  of Novolin 70/30 tonight (8 units for BG < 150 or 14 u for BG > 200) and no insulin on DOS.Pt made aware to check BG every 2 hours prior to arrival to hospital on DOS. Pt made aware to treat a BG < 70 with 4 ounces of apple juice, wait 15 minutes after intervention to recheck BG, if BG remains < 70, call Short Stay unit to speak with a nurse.Pt verbalized understanding of all pre-op instructions.  Pt denies that she and family members tested positive for COVID-19. Pt denies that she and family members experienced the following symptoms:  Cough yes/no: No Fever (>100.51F)  yes/no: No Runny nose yes/no: No Sore throat yes/no: No Difficulty breathing/shortness of breath  yes/no: No  Have you or a family member traveled in the last 14 days and where? yes/no: No  Pt reminded that hospital visitation restrictions are in effect and the importance of the restrictions.    Pt verbalized understanding of all pre-op instructions.

## 2019-04-13 ENCOUNTER — Encounter (HOSPITAL_COMMUNITY): Payer: Self-pay

## 2019-04-13 ENCOUNTER — Ambulatory Visit (HOSPITAL_COMMUNITY): Payer: Medicare PPO | Admitting: Anesthesiology

## 2019-04-13 ENCOUNTER — Encounter (HOSPITAL_COMMUNITY)
Admission: RE | Disposition: A | Discharge status: Home / Self Care | Payer: Self-pay | Source: Home / Self Care | Attending: Surgery

## 2019-04-13 ENCOUNTER — Ambulatory Visit (HOSPITAL_COMMUNITY)
Admission: RE | Admit: 2019-04-13 | Discharge: 2019-04-13 | Disposition: A | Discharge status: Home / Self Care | Payer: Medicare PPO | Attending: Surgery | Admitting: Surgery

## 2019-04-13 ENCOUNTER — Other Ambulatory Visit: Payer: Self-pay

## 2019-04-13 DIAGNOSIS — Z992 Dependence on renal dialysis: Secondary | ICD-10-CM | POA: Insufficient documentation

## 2019-04-13 DIAGNOSIS — L02818 Cutaneous abscess of other sites: Secondary | ICD-10-CM | POA: Insufficient documentation

## 2019-04-13 DIAGNOSIS — M1990 Unspecified osteoarthritis, unspecified site: Secondary | ICD-10-CM | POA: Diagnosis not present

## 2019-04-13 DIAGNOSIS — N186 End stage renal disease: Secondary | ICD-10-CM | POA: Diagnosis not present

## 2019-04-13 DIAGNOSIS — Z1159 Encounter for screening for other viral diseases: Secondary | ICD-10-CM | POA: Diagnosis not present

## 2019-04-13 DIAGNOSIS — Z79899 Other long term (current) drug therapy: Secondary | ICD-10-CM | POA: Insufficient documentation

## 2019-04-13 DIAGNOSIS — E1122 Type 2 diabetes mellitus with diabetic chronic kidney disease: Secondary | ICD-10-CM | POA: Insufficient documentation

## 2019-04-13 DIAGNOSIS — Z794 Long term (current) use of insulin: Secondary | ICD-10-CM | POA: Insufficient documentation

## 2019-04-13 DIAGNOSIS — T82898A Other specified complication of vascular prosthetic devices, implants and grafts, initial encounter: Secondary | ICD-10-CM | POA: Diagnosis not present

## 2019-04-13 DIAGNOSIS — I120 Hypertensive chronic kidney disease with stage 5 chronic kidney disease or end stage renal disease: Secondary | ICD-10-CM | POA: Insufficient documentation

## 2019-04-13 DIAGNOSIS — N185 Chronic kidney disease, stage 5: Secondary | ICD-10-CM | POA: Diagnosis not present

## 2019-04-13 DIAGNOSIS — Z7982 Long term (current) use of aspirin: Secondary | ICD-10-CM | POA: Diagnosis not present

## 2019-04-13 HISTORY — PX: AV FISTULA PLACEMENT: SHX1204

## 2019-04-13 LAB — POCT I-STAT 4, (NA,K, GLUC, HGB,HCT)
Glucose, Bld: 99 mg/dL (ref 70–99)
HCT: 32 % — ABNORMAL LOW (ref 36.0–46.0)
Hemoglobin: 10.9 g/dL — ABNORMAL LOW (ref 12.0–15.0)
Potassium: 3.9 mmol/L (ref 3.5–5.1)
Sodium: 136 mmol/L (ref 135–145)

## 2019-04-13 LAB — GLUCOSE, CAPILLARY
Glucose-Capillary: 97 mg/dL (ref 70–99)
Glucose-Capillary: 92 mg/dL (ref 70–99)
Glucose-Capillary: 117 mg/dL — ABNORMAL HIGH (ref 70–99)

## 2019-04-13 LAB — SARS CORONAVIRUS 2 BY RT PCR (HOSPITAL ORDER, PERFORMED IN ~~LOC~~ HOSPITAL LAB): SARS Coronavirus 2: NEGATIVE

## 2019-04-13 SURGERY — INSERTION OF ARTERIOVENOUS (AV) GORE-TEX GRAFT ARM
Anesthesia: Monitor Anesthesia Care | Site: Arm Lower | Laterality: Right

## 2019-04-13 MED ORDER — CHLORHEXIDINE GLUCONATE 4 % EX LIQD: 60.0000 mL | Freq: Once | CUTANEOUS | Status: DC

## 2019-04-13 MED ORDER — SODIUM CHLORIDE 0.9 % IV SOLN
INTRAVENOUS | Status: DC
  Administered 2019-04-13: 12:00:00 via INTRAVENOUS

## 2019-04-13 MED ORDER — HEPARIN SODIUM (PORCINE) 1000 UNIT/ML IJ SOLN
INTRAMUSCULAR | Status: DC | PRN
  Administered 2019-04-13: 3000 [IU] via INTRAVENOUS

## 2019-04-13 MED ORDER — ONDANSETRON HCL 4 MG/2ML IJ SOLN
INTRAMUSCULAR | Status: DC | PRN
  Administered 2019-04-13: 4 mg via INTRAVENOUS

## 2019-04-13 MED ORDER — PROPOFOL 10 MG/ML IV BOLUS
INTRAVENOUS | Status: DC | PRN
  Administered 2019-04-13: 120 mg via INTRAVENOUS

## 2019-04-13 MED ORDER — FENTANYL CITRATE (PF) 100 MCG/2ML IJ SOLN
25.0000 ug | INTRAMUSCULAR | Status: DC | PRN
  Administered 2019-04-13: 18:00:00 50 ug via INTRAVENOUS

## 2019-04-13 MED ORDER — SODIUM CHLORIDE 0.9 % IV SOLN
INTRAVENOUS | Status: AC
  Filled 2019-04-13: qty 1.2

## 2019-04-13 MED ORDER — HEMOSTATIC AGENTS (NO CHARGE) OPTIME
TOPICAL | Status: DC | PRN
  Administered 2019-04-13: 1 via TOPICAL

## 2019-04-13 MED ORDER — CLOPIDOGREL BISULFATE 75 MG PO TABS: 75.0000 mg | ORAL_TABLET | Freq: Every day | ORAL | 11 refills | Status: DC

## 2019-04-13 MED ORDER — ONDANSETRON HCL 4 MG/2ML IJ SOLN: 4.0000 mg | Freq: Once | INTRAMUSCULAR | Status: DC | PRN

## 2019-04-13 MED ORDER — MIDAZOLAM HCL 5 MG/5ML IJ SOLN
INTRAMUSCULAR | Status: DC | PRN
  Administered 2019-04-13: 1 mg via INTRAVENOUS

## 2019-04-13 MED ORDER — FENTANYL CITRATE (PF) 250 MCG/5ML IJ SOLN
INTRAMUSCULAR | Status: DC | PRN
  Administered 2019-04-13 (×3): 50 ug via INTRAVENOUS

## 2019-04-13 MED ORDER — DEXAMETHASONE SODIUM PHOSPHATE 10 MG/ML IJ SOLN
INTRAMUSCULAR | Status: AC
  Filled 2019-04-13: qty 2

## 2019-04-13 MED ORDER — ONDANSETRON HCL 4 MG/2ML IJ SOLN
INTRAMUSCULAR | Status: AC
  Filled 2019-04-13: qty 4

## 2019-04-13 MED ORDER — OXYCODONE HCL 5 MG PO TABS
ORAL_TABLET | ORAL | Status: AC
  Filled 2019-04-13: qty 1

## 2019-04-13 MED ORDER — OXYCODONE HCL 5 MG PO TABS
5.0000 mg | ORAL_TABLET | Freq: Once | ORAL | Status: AC | PRN
  Administered 2019-04-13: 5 mg via ORAL

## 2019-04-13 MED ORDER — CEFAZOLIN SODIUM-DEXTROSE 2-4 GM/100ML-% IV SOLN
2.0000 g | INTRAVENOUS | Status: AC
  Administered 2019-04-13: 2 g via INTRAVENOUS
  Filled 2019-04-13: qty 100

## 2019-04-13 MED ORDER — SODIUM CHLORIDE 0.9 % IV SOLN
INTRAVENOUS | Status: DC | PRN
  Administered 2019-04-13: 500 mL

## 2019-04-13 MED ORDER — FENTANYL CITRATE (PF) 250 MCG/5ML IJ SOLN
INTRAMUSCULAR | Status: AC
  Filled 2019-04-13: qty 5

## 2019-04-13 MED ORDER — 0.9 % SODIUM CHLORIDE (POUR BTL) OPTIME
TOPICAL | Status: DC | PRN
  Administered 2019-04-13: 1000 mL

## 2019-04-13 MED ORDER — EPHEDRINE 5 MG/ML INJ
INTRAVENOUS | Status: AC
  Filled 2019-04-13: qty 10

## 2019-04-13 MED ORDER — DEXAMETHASONE SODIUM PHOSPHATE 10 MG/ML IJ SOLN
INTRAMUSCULAR | Status: DC | PRN
  Administered 2019-04-13: 5 mg via INTRAVENOUS

## 2019-04-13 MED ORDER — SUCCINYLCHOLINE CHLORIDE 200 MG/10ML IV SOSY
PREFILLED_SYRINGE | INTRAVENOUS | Status: AC
  Filled 2019-04-13: qty 10

## 2019-04-13 MED ORDER — PROPOFOL 10 MG/ML IV BOLUS
INTRAVENOUS | Status: AC
  Filled 2019-04-13: qty 20

## 2019-04-13 MED ORDER — SODIUM CHLORIDE 0.9 % IV SOLN
INTRAVENOUS | Status: DC | PRN
  Administered 2019-04-13: 25 ug/min via INTRAVENOUS

## 2019-04-13 MED ORDER — LIDOCAINE-EPINEPHRINE (PF) 1 %-1:200000 IJ SOLN
INTRAMUSCULAR | Status: AC
  Filled 2019-04-13: qty 30

## 2019-04-13 MED ORDER — MIDAZOLAM HCL 2 MG/2ML IJ SOLN
INTRAMUSCULAR | Status: AC
  Filled 2019-04-13: qty 2

## 2019-04-13 MED ORDER — FENTANYL CITRATE (PF) 100 MCG/2ML IJ SOLN
INTRAMUSCULAR | Status: AC
  Filled 2019-04-13: qty 2

## 2019-04-13 MED ORDER — OXYCODONE HCL 5 MG/5ML PO SOLN: 5.0000 mg | Freq: Once | ORAL | Status: AC | PRN

## 2019-04-13 MED ORDER — LIDOCAINE HCL (CARDIAC) PF 100 MG/5ML IV SOSY
PREFILLED_SYRINGE | INTRAVENOUS | Status: DC | PRN
  Administered 2019-04-13: 60 mg via INTRAVENOUS

## 2019-04-13 MED ORDER — OXYCODONE-ACETAMINOPHEN 5-325 MG PO TABS: 1.0000 | ORAL_TABLET | Freq: Four times a day (QID) | ORAL | 0 refills | Status: DC | PRN

## 2019-04-13 SURGICAL SUPPLY — 40 items
ARMBAND PINK RESTRICT EXTREMIT (MISCELLANEOUS) ×2 IMPLANT
CANISTER SUCT 3000ML PPV (MISCELLANEOUS) ×2 IMPLANT
CATH EMB 4FR 80CM (CATHETERS) ×2 IMPLANT
CLIP VESOCCLUDE MED 6/CT (CLIP) ×2 IMPLANT
CLIP VESOCCLUDE SM WIDE 6/CT (CLIP) ×2 IMPLANT
COVER PROBE W GEL 5X96 (DRAPES) ×2 IMPLANT
COVER WAND RF STERILE (DRAPES) ×2 IMPLANT
DERMABOND ADVANCED (GAUZE/BANDAGES/DRESSINGS) ×2
DERMABOND ADVANCED .7 DNX12 (GAUZE/BANDAGES/DRESSINGS) ×2 IMPLANT
DRSG COVADERM 4X6 (GAUZE/BANDAGES/DRESSINGS) IMPLANT
DRSG TEGADERM 4X4.75 (GAUZE/BANDAGES/DRESSINGS) ×2 IMPLANT
ELECT REM PT RETURN 9FT ADLT (ELECTROSURGICAL) ×2
ELECTRODE REM PT RTRN 9FT ADLT (ELECTROSURGICAL) ×1 IMPLANT
GAUZE SPONGE 4X4 12PLY STRL LF (GAUZE/BANDAGES/DRESSINGS) ×2 IMPLANT
GLOVE BIOGEL PI IND STRL 7.5 (GLOVE) ×2 IMPLANT
GLOVE BIOGEL PI INDICATOR 7.5 (GLOVE) ×2
GLOVE ECLIPSE 7.0 STRL STRAW (GLOVE) ×2 IMPLANT
GLOVE SURG SS PI 7.5 STRL IVOR (GLOVE) ×2 IMPLANT
GOWN STRL REUS W/ TWL LRG LVL3 (GOWN DISPOSABLE) ×2 IMPLANT
GOWN STRL REUS W/ TWL XL LVL3 (GOWN DISPOSABLE) ×1 IMPLANT
GOWN STRL REUS W/TWL LRG LVL3 (GOWN DISPOSABLE) ×2
GOWN STRL REUS W/TWL XL LVL3 (GOWN DISPOSABLE) ×1
GRAFT COLLAGEN VASCULAR 7X40 (Vascular Products) ×2 IMPLANT
HEMOSTAT SNOW SURGICEL 2X4 (HEMOSTASIS) ×2 IMPLANT
KIT BASIN OR (CUSTOM PROCEDURE TRAY) ×2 IMPLANT
KIT TURNOVER KIT B (KITS) ×2 IMPLANT
NS IRRIG 1000ML POUR BTL (IV SOLUTION) ×2 IMPLANT
PACK CV ACCESS (CUSTOM PROCEDURE TRAY) ×2 IMPLANT
PAD ARMBOARD 7.5X6 YLW CONV (MISCELLANEOUS) ×4 IMPLANT
SPONGE LAP 18X18 RF (DISPOSABLE) ×2 IMPLANT
SUT ETHILON 3 0 PS 1 (SUTURE) ×2 IMPLANT
SUT PROLENE 5 0 C 1 24 (SUTURE) ×2 IMPLANT
SUT PROLENE 6 0 BV (SUTURE) ×4 IMPLANT
SUT VIC AB 3-0 SH 27 (SUTURE) ×2
SUT VIC AB 3-0 SH 27X BRD (SUTURE) ×2 IMPLANT
SUT VICRYL 4-0 PS2 18IN ABS (SUTURE) IMPLANT
SYR TOOMEY 50ML (SYRINGE) IMPLANT
TOWEL GREEN STERILE (TOWEL DISPOSABLE) ×2 IMPLANT
UNDERPAD 30X30 (UNDERPADS AND DIAPERS) ×2 IMPLANT
WATER STERILE IRR 1000ML POUR (IV SOLUTION) ×2 IMPLANT

## 2019-04-13 NOTE — Anesthesia Procedure Notes (Signed)
Procedure Name: LMA Insertion Date/Time: 04/13/2019 3:56 PM Performed by: Mariea Clonts, CRNA Pre-anesthesia Checklist: Patient identified, Emergency Drugs available, Suction available and Patient being monitored Patient Re-evaluated:Patient Re-evaluated prior to induction Oxygen Delivery Method: Circle System Utilized Preoxygenation: Pre-oxygenation with 100% oxygen Induction Type: IV induction Ventilation: Mask ventilation without difficulty LMA: LMA inserted LMA Size: 4.0 Number of attempts: 1 Airway Equipment and Method: Bite block Placement Confirmation: positive ETCO2 Tube secured with: Tape Dental Injury: Teeth and Oropharynx as per pre-operative assessment

## 2019-04-13 NOTE — Anesthesia Preprocedure Evaluation (Addendum)
Anesthesia Evaluation  Patient identified by MRN, date of birth, ID band Patient awake    Reviewed: Allergy & Precautions, NPO status , Patient's Chart, lab work & pertinent test results, reviewed documented beta blocker date and time   History of Anesthesia Complications Negative for: history of anesthetic complications  Airway Mallampati: II  TM Distance: >3 FB Neck ROM: Full    Dental  (+) Dental Advisory Given, Partial Upper   Pulmonary neg pulmonary ROS,    breath sounds clear to auscultation       Cardiovascular hypertension, Pt. on medications and Pt. on home beta blockers  Rhythm:Regular Rate:Normal     Neuro/Psych  Headaches, CVA, No Residual Symptoms negative psych ROS   GI/Hepatic Neg liver ROS, GERD  Medicated and Controlled,  Endo/Other  diabetes, Insulin Dependent  Renal/GU ESRF and DialysisRenal disease     Musculoskeletal  (+) Arthritis ,   Abdominal   Peds  Hematology negative hematology ROS (+)   Anesthesia Other Findings   Reproductive/Obstetrics                            Anesthesia Physical Anesthesia Plan  ASA: III  Anesthesia Plan: General   Post-op Pain Management:    Induction: Intravenous  PONV Risk Score and Plan: 3 and Treatment may vary due to age or medical condition, Ondansetron and Dexamethasone  Airway Management Planned: LMA  Additional Equipment: None  Intra-op Plan:   Post-operative Plan: Extubation in OR  Informed Consent: I have reviewed the patients History and Physical, chart, labs and discussed the procedure including the risks, benefits and alternatives for the proposed anesthesia with the patient or authorized representative who has indicated his/her understanding and acceptance.     Dental advisory given  Plan Discussed with: CRNA, Anesthesiologist and Surgeon  Anesthesia Plan Comments:       Anesthesia Quick  Evaluation

## 2019-04-13 NOTE — Interval H&P Note (Signed)
History and Physical Interval Note:  04/13/2019 9:45 PM  [name]  has presented today for surgery, with the diagnosis of END STAGE RENAL DISEASE FOR HEMODIALYSIS ACCESS.  The various methods of treatment have been discussed with the patient and family. After consideration of risks, benefits and other options for treatment, the patient has consented to  Procedure(s): INSERTION OF ARTERIOVENOUS (AV) BOVINE  ARTEGRAFT GRAFT RIGHT UPPER EXTREMITY (Right) as a surgical intervention.  The patient's history has been reviewed, patient examined, no change in status, stable for surgery.  I have reviewed the patient's chart and labs.  Questions were answered to the patient's satisfaction.     Annamarie Major

## 2019-04-13 NOTE — Op Note (Signed)
° ° °  Patient name: [name] MRN: [id number] DOB: [birth year] Sex: female  04/13/2019 Pre-operative Diagnosis: ESRD Post-operative diagnosis:  Same Surgeon:  Annamarie Major Assistants:  Laurence Slate Procedure:   #1: Placement of a new right upper extremity dialysis graft (6 mm Artegraft)   #2: Incision and drainage of incisional abscess (2 cm incision) Anesthesia: General Blood Loss: Minimal Specimens: None  Findings: The previous Gore-Tex graft was removed.  The venous anastomosis was end to end  Indications: The patient has had multiple early failures of dialysis grafts in both arms.  She is undergone venography as well as arterial angiography which did not show any explanation for premature failure.  This time, I have elected to use a Artegraft rather than Gore-Tex and in addition we will start her on Plavix, thinking that she may have some underlying coagulopathy.  Procedure:  The patient was identified in the holding area and taken to [address]  The patient was then placed supine on the table. general anesthesia was administered.  The patient was prepped and draped in the usual sterile fashion.  A time out was called and antibiotics were administered.  The patient's previous incision at the antecubital crease as well as the axilla were opened with 11 blade.  Sharp dissection was used to dissect out the brachial artery which was encircled proximally and distally to the existing Gore-Tex graft with Vesseloops.  In the axilla I dissected out the venous anastomosis as well.  A 6 mm Artegraft was then prepared on the back table.  The patient was given systemic heparinization.  After the heparin circulated, the brachial artery was occluded with vascular clamps.  I then removed the Gore-Tex to artery anastomosis with an 11 blade.  I then beveled the Artegraft and performed a end-to-side anastomosis with running 6-0 Prolene.  The appropriate flushing maneuvers were performed and the clamps were  released.  There was excellent flow through the Artegraft.  I then removed the previous Gore-Tex graft and brought the Artegraft through the same tunnel.  I then ligated the vein distally and then removed the Gore-Tex graft, by transecting the vein.  The vein was about 8 mm at this level.  I passed a Fogarty catheter centrally.  It went without resistance.  There was no thrombus evacuated.  The graft was then cut to the appropriate length and a end to end anastomosis was created with 5-0 Prolene.  Once anastomosis was completed the clamps were released.  There was an excellent thrill within the graft and she had a palpable radial pulse.  Protamine was then administered.  The wound was irrigated and hemostasis was achieved.  The incisions were closed with a layer of 3-0 and 4-0 Vicryl suture and Dermabond was placed.  I then probed her previous incision from her basilic vein fistula which appeared to have some fullness.  I did evacuate a small amount of purulent drainage.  The skin was then opened for a distance of about 2 cm.  There was no residual purulence.  I irrigated the wound and loosely reapproximated the skin edges with interrupted 3-0 nylon suture.   Disposition: To PACU stable   V. Annamarie Major, M.D., [facility] [place] Office: [phone] Pager:  [phone]

## 2019-04-13 NOTE — Transfer of Care (Signed)
Immediate Anesthesia Transfer of Care Note  Patient: [name]  Procedure(s) Performed: INSERTION OF ARTERIOVENOUS (AV) BOVINE  ARTEGRAFT GRAFT RIGHT UPPER EXTREMITY (Right Arm Lower)  Patient Location: PACU  Anesthesia Type:General  Level of Consciousness: drowsy and patient cooperative  Airway & Oxygen Therapy: Patient Spontanous Breathing and Patient connected to nasal cannula oxygen  Post-op Assessment: Report given to RN, Post -op Vital signs reviewed and stable and Patient moving all extremities  Post vital signs: Reviewed and stable  Last Vitals:  Vitals Value Taken Time  BP 126/70 04/13/2019  5:39 PM  Temp    Pulse 83 04/13/2019  5:39 PM  Resp 10 04/13/2019  5:39 PM  SpO2 100 % 04/13/2019  5:39 PM  Vitals shown include unvalidated device data.  Last Pain:  Vitals:   04/13/19 1209  TempSrc:   PainSc: 0-No pain      Patients Stated Pain Goal: 4 (26/33/35 4562)  Complications: No apparent anesthesia complications

## 2019-04-14 ENCOUNTER — Encounter (HOSPITAL_COMMUNITY): Payer: Self-pay | Admitting: Surgery

## 2019-04-14 DIAGNOSIS — N186 End stage renal disease: Secondary | ICD-10-CM | POA: Diagnosis not present

## 2019-04-14 DIAGNOSIS — Z992 Dependence on renal dialysis: Secondary | ICD-10-CM | POA: Diagnosis not present

## 2019-04-14 NOTE — Anesthesia Postprocedure Evaluation (Signed)
Anesthesia Post Note  Patient: Personnel officer  Procedure(s) Performed: INSERTION OF ARTERIOVENOUS (AV) BOVINE  ARTEGRAFT GRAFT RIGHT UPPER EXTREMITY (Right Arm Lower)     Patient location during evaluation: PACU Anesthesia Type: General Level of consciousness: awake and alert Pain management: pain level controlled Vital Signs Assessment: post-procedure vital signs reviewed and stable Respiratory status: spontaneous breathing, nonlabored ventilation and respiratory function stable Cardiovascular status: blood pressure returned to baseline and stable Postop Assessment: no apparent nausea or vomiting Anesthetic complications: no    Last Vitals:  Vitals:   04/13/19 1815 04/13/19 1828  BP:  126/74  Pulse: 77   Resp: 16   Temp:  (!) 36.4 C  SpO2: 97%     Last Pain:  Vitals:   04/13/19 1828  TempSrc:   PainSc: [place]

## 2019-04-17 DIAGNOSIS — N186 End stage renal disease: Secondary | ICD-10-CM | POA: Diagnosis not present

## 2019-04-17 DIAGNOSIS — Z992 Dependence on renal dialysis: Secondary | ICD-10-CM | POA: Diagnosis not present

## 2019-04-17 NOTE — Anesthesia Postprocedure Evaluation (Signed)
Anesthesia Post Note  Patient: [name]  Procedure(s) Performed: INSERTION OF ARTERIOVENOUS (AV) GORE-TEX GRAFT RIGHT UPPER ARM (Right Arm Upper)     Patient location during evaluation: PACU Anesthesia Type: MAC Level of consciousness: awake and alert Pain management: pain level controlled Vital Signs Assessment: post-procedure vital signs reviewed and stable Respiratory status: spontaneous breathing Cardiovascular status: stable Anesthetic complications: no    Last Vitals:  Vitals:   04/06/19 1030 04/06/19 1040  BP: 132/80 129/76  Pulse: 92 94  Resp: 18 18  Temp:  36.4 C  SpO2: 100% 99%    Last Pain:  Vitals:   04/06/19 1040  TempSrc:   PainSc: 0-No pain                 Nolon Nations

## 2019-04-19 DIAGNOSIS — Z992 Dependence on renal dialysis: Secondary | ICD-10-CM | POA: Diagnosis not present

## 2019-04-19 DIAGNOSIS — N186 End stage renal disease: Secondary | ICD-10-CM | POA: Diagnosis not present

## 2019-04-22 DIAGNOSIS — Z992 Dependence on renal dialysis: Secondary | ICD-10-CM | POA: Diagnosis not present

## 2019-04-22 DIAGNOSIS — N186 End stage renal disease: Secondary | ICD-10-CM | POA: Diagnosis not present

## 2019-04-23 DIAGNOSIS — Z992 Dependence on renal dialysis: Secondary | ICD-10-CM | POA: Diagnosis not present

## 2019-04-23 DIAGNOSIS — N186 End stage renal disease: Secondary | ICD-10-CM | POA: Diagnosis not present

## 2019-04-24 DIAGNOSIS — Z298 Encounter for other specified prophylactic measures: Secondary | ICD-10-CM | POA: Diagnosis not present

## 2019-04-24 DIAGNOSIS — N186 End stage renal disease: Secondary | ICD-10-CM | POA: Diagnosis not present

## 2019-04-24 DIAGNOSIS — Z992 Dependence on renal dialysis: Secondary | ICD-10-CM | POA: Diagnosis not present

## 2019-04-26 DIAGNOSIS — Z298 Encounter for other specified prophylactic measures: Secondary | ICD-10-CM | POA: Diagnosis not present

## 2019-04-26 DIAGNOSIS — N186 End stage renal disease: Secondary | ICD-10-CM | POA: Diagnosis not present

## 2019-04-26 DIAGNOSIS — Z992 Dependence on renal dialysis: Secondary | ICD-10-CM | POA: Diagnosis not present

## 2019-04-28 DIAGNOSIS — Z992 Dependence on renal dialysis: Secondary | ICD-10-CM | POA: Diagnosis not present

## 2019-04-28 DIAGNOSIS — N186 End stage renal disease: Secondary | ICD-10-CM | POA: Diagnosis not present

## 2019-04-28 DIAGNOSIS — Z298 Encounter for other specified prophylactic measures: Secondary | ICD-10-CM | POA: Diagnosis not present

## 2019-05-01 ENCOUNTER — Encounter: Payer: Self-pay | Admitting: Surgery

## 2019-05-01 ENCOUNTER — Ambulatory Visit (INDEPENDENT_AMBULATORY_CARE_PROVIDER_SITE_OTHER): Payer: Self-pay | Admitting: Surgery

## 2019-05-01 ENCOUNTER — Other Ambulatory Visit: Payer: Self-pay

## 2019-05-01 VITALS — BP 130/70 | HR 97 | Temp 98.3°F | Resp 14 | Ht 67.0 in | Wt 155.0 lb

## 2019-05-01 DIAGNOSIS — Z992 Dependence on renal dialysis: Secondary | ICD-10-CM

## 2019-05-01 DIAGNOSIS — N186 End stage renal disease: Secondary | ICD-10-CM | POA: Diagnosis not present

## 2019-05-01 DIAGNOSIS — Z298 Encounter for other specified prophylactic measures: Secondary | ICD-10-CM | POA: Diagnosis not present

## 2019-05-01 NOTE — Progress Notes (Signed)
   Patient name: [name] MRN: [id number] DOB: 13-Jun-1951 Sex: female  REASON FOR VISIT:     post op  HISTORY OF PRESENT ILLNESS:   [name] is a 68 y.o. female with end-stage renal disease.  She has had multiple access procedures in her arms which have occluded prematurely.  She underwent a venogram that showed the venous system on the right to be widely patent.  Arterial evaluation was also normal.  Most recently on 04/13/2019 a 6 mm right upper extremity Artegraft was placed.  She was also started on Plavix.  CURRENT MEDICATIONS:    Current Outpatient Medications  Medication Sig Dispense Refill  . amLODipine (NORVASC) 10 MG tablet Take 10 mg by mouth daily after lunch.     Marland Kitchen aspirin EC 81 MG tablet Take 81 mg by mouth daily after lunch.     . calcium carbonate (TUMS - DOSED IN MG ELEMENTAL CALCIUM) 500 MG chewable tablet Chew 1 tablet by mouth 2 (two) times daily. With lunch & supper    . carvedilol (COREG) 6.25 MG tablet Take 6.25 mg by mouth 2 (two) times daily.    . clopidogrel (PLAVIX) 75 MG tablet Take 1 tablet (75 mg total) by mouth daily. 30 tablet 11  . fluticasone (FLONASE) 50 MCG/ACT nasal spray Place 1 spray into both nostrils daily as needed for allergies or rhinitis.     Marland Kitchen insulin NPH-regular Human (NOVOLIN 70/30) (70-30) 100 UNIT/ML injection Inject 12-20 Units into the skin See admin instructions. Use twice daily per sliding scale: Blood sugar less than 150=12 units & Blood sugar 200 or more=20 units    . isosorbide mononitrate (IMDUR) 30 MG 24 hr tablet Take 30 mg by mouth daily after lunch.     . multivitamin (RENA-VIT) TABS tablet Take 1 tablet by mouth daily after lunch.     . oxyCODONE-acetaminophen (PERCOCET/ROXICET) 5-325 MG tablet Take 1 tablet by mouth every 6 (six) hours as needed. 20 tablet 0   No current facility-administered medications for this visit.    Facility-Administered Medications Ordered in Other  Visits  Medication Dose Route Frequency Provider Last Rate Last Dose  . 0.9 %  sodium chloride infusion   Intravenous Continuous Monia Sabal, PA-C        REVIEW OF SYSTEMS:   [X]  denotes positive finding, [ ]  denotes negative finding Cardiac  Comments:  Chest pain or chest pressure:    Shortness of breath upon exertion:    Short of breath when lying flat:    Irregular heart rhythm:    Constitutional    Fever or chills:      PHYSICAL EXAM:   There were no vitals filed for this visit.  GENERAL: The patient is a well-nourished female, in no acute distress. The vital signs are documented above. CARDIOVASCULAR: There is a regular rate and rhythm. PULMONARY: Non-labored respirations Excellent thrill in right UE AVGG Incisions healed  STUDIES:   none   MEDICAL ISSUES:   Graft will be ready for use on June 21,2020 Continue Plavix F/u prn  Leia Alf, MD, FACS [facility] [phone] Pager [phone]

## 2019-05-01 NOTE — H&P (View-Only) (Signed)
   Patient name: Deijah Spikes MRN: [id number] DOB: 07-26-1951 Sex: female  REASON FOR VISIT:     post op  HISTORY OF PRESENT ILLNESS:   Eda Magnussen is a 68 y.o. female with end-stage renal disease.  She has had multiple access procedures in her arms which have occluded prematurely.  She underwent a venogram that showed the venous system on the right to be widely patent.  Arterial evaluation was also normal.  Most recently on 04/13/2019 a 6 mm right upper extremity Artegraft was placed.  She was also started on Plavix.  CURRENT MEDICATIONS:    Current Outpatient Medications  Medication Sig Dispense Refill  . amLODipine (NORVASC) 10 MG tablet Take 10 mg by mouth daily after lunch.     Marland Kitchen aspirin EC 81 MG tablet Take 81 mg by mouth daily after lunch.     . calcium carbonate (TUMS - DOSED IN MG ELEMENTAL CALCIUM) 500 MG chewable tablet Chew 1 tablet by mouth 2 (two) times daily. With lunch & supper    . carvedilol (COREG) 6.25 MG tablet Take 6.25 mg by mouth 2 (two) times daily.    . clopidogrel (PLAVIX) 75 MG tablet Take 1 tablet (75 mg total) by mouth daily. 30 tablet 11  . fluticasone (FLONASE) 50 MCG/ACT nasal spray Place 1 spray into both nostrils daily as needed for allergies or rhinitis.     Marland Kitchen insulin NPH-regular Human (NOVOLIN 70/30) (70-30) 100 UNIT/ML injection Inject 12-20 Units into the skin See admin instructions. Use twice daily per sliding scale: Blood sugar less than 150=12 units & Blood sugar 200 or more=20 units    . isosorbide mononitrate (IMDUR) 30 MG 24 hr tablet Take 30 mg by mouth daily after lunch.     . multivitamin (RENA-VIT) TABS tablet Take 1 tablet by mouth daily after lunch.     . oxyCODONE-acetaminophen (PERCOCET/ROXICET) 5-325 MG tablet Take 1 tablet by mouth every 6 (six) hours as needed. 20 tablet 0   No current facility-administered medications for this visit.    Facility-Administered Medications Ordered in Other  Visits  Medication Dose Route Frequency Provider Last Rate Last Dose  . 0.9 %  sodium chloride infusion   Intravenous Continuous Monia Sabal, PA-C        REVIEW OF SYSTEMS:   [X]  denotes positive finding, [ ]  denotes negative finding Cardiac  Comments:  Chest pain or chest pressure:    Shortness of breath upon exertion:    Short of breath when lying flat:    Irregular heart rhythm:    Constitutional    Fever or chills:      PHYSICAL EXAM:   There were no vitals filed for this visit.  GENERAL: The patient is a well-nourished female, in no acute distress. The vital signs are documented above. CARDIOVASCULAR: There is a regular rate and rhythm. PULMONARY: Non-labored respirations Excellent thrill in right UE AVGG Incisions healed  STUDIES:   none   MEDICAL ISSUES:   Graft will be ready for use on June 21,2020 Continue Plavix F/u prn  Leia Alf, MD, FACS [facility] [phone] Pager [phone]

## 2019-05-03 DIAGNOSIS — Z298 Encounter for other specified prophylactic measures: Secondary | ICD-10-CM | POA: Diagnosis not present

## 2019-05-03 DIAGNOSIS — Z992 Dependence on renal dialysis: Secondary | ICD-10-CM | POA: Diagnosis not present

## 2019-05-03 DIAGNOSIS — N186 End stage renal disease: Secondary | ICD-10-CM | POA: Diagnosis not present

## 2019-05-05 DIAGNOSIS — Z992 Dependence on renal dialysis: Secondary | ICD-10-CM | POA: Diagnosis not present

## 2019-05-05 DIAGNOSIS — Z298 Encounter for other specified prophylactic measures: Secondary | ICD-10-CM | POA: Diagnosis not present

## 2019-05-05 DIAGNOSIS — N186 End stage renal disease: Secondary | ICD-10-CM | POA: Diagnosis not present

## 2019-05-08 DIAGNOSIS — N186 End stage renal disease: Secondary | ICD-10-CM | POA: Diagnosis not present

## 2019-05-08 DIAGNOSIS — Z298 Encounter for other specified prophylactic measures: Secondary | ICD-10-CM | POA: Diagnosis not present

## 2019-05-08 DIAGNOSIS — Z992 Dependence on renal dialysis: Secondary | ICD-10-CM | POA: Diagnosis not present

## 2019-05-10 DIAGNOSIS — Z992 Dependence on renal dialysis: Secondary | ICD-10-CM | POA: Diagnosis not present

## 2019-05-10 DIAGNOSIS — N186 End stage renal disease: Secondary | ICD-10-CM | POA: Diagnosis not present

## 2019-05-10 DIAGNOSIS — Z298 Encounter for other specified prophylactic measures: Secondary | ICD-10-CM | POA: Diagnosis not present

## 2019-05-12 DIAGNOSIS — Z298 Encounter for other specified prophylactic measures: Secondary | ICD-10-CM | POA: Diagnosis not present

## 2019-05-12 DIAGNOSIS — Z992 Dependence on renal dialysis: Secondary | ICD-10-CM | POA: Diagnosis not present

## 2019-05-12 DIAGNOSIS — N186 End stage renal disease: Secondary | ICD-10-CM | POA: Diagnosis not present

## 2019-05-15 ENCOUNTER — Other Ambulatory Visit: Payer: Self-pay

## 2019-05-15 ENCOUNTER — Other Ambulatory Visit: Payer: Self-pay | Admitting: *Deleted

## 2019-05-15 ENCOUNTER — Telehealth: Payer: Self-pay | Admitting: *Deleted

## 2019-05-15 ENCOUNTER — Ambulatory Visit (INDEPENDENT_AMBULATORY_CARE_PROVIDER_SITE_OTHER): Payer: Self-pay | Admitting: Physician Assistant

## 2019-05-15 ENCOUNTER — Ambulatory Visit (INDEPENDENT_AMBULATORY_CARE_PROVIDER_SITE_OTHER)
Admission: RE | Admit: 2019-05-15 | Discharge: 2019-05-15 | Disposition: A | Discharge status: Home / Self Care | Payer: Medicare PPO | Source: Ambulatory Visit | Attending: Family | Admitting: Family

## 2019-05-15 VITALS — BP 156/91 | HR 103 | Temp 100.1°F | Resp 12 | Ht 67.0 in | Wt 155.0 lb

## 2019-05-15 DIAGNOSIS — Z298 Encounter for other specified prophylactic measures: Secondary | ICD-10-CM | POA: Diagnosis not present

## 2019-05-15 DIAGNOSIS — Z833 Family history of diabetes mellitus: Secondary | ICD-10-CM | POA: Diagnosis not present

## 2019-05-15 DIAGNOSIS — E785 Hyperlipidemia, unspecified: Secondary | ICD-10-CM | POA: Diagnosis present

## 2019-05-15 DIAGNOSIS — Z794 Long term (current) use of insulin: Secondary | ICD-10-CM | POA: Diagnosis not present

## 2019-05-15 DIAGNOSIS — Z1159 Encounter for screening for other viral diseases: Secondary | ICD-10-CM | POA: Diagnosis not present

## 2019-05-15 DIAGNOSIS — T827XXA Infection and inflammatory reaction due to other cardiac and vascular devices, implants and grafts, initial encounter: Secondary | ICD-10-CM | POA: Diagnosis present

## 2019-05-15 DIAGNOSIS — M19041 Primary osteoarthritis, right hand: Secondary | ICD-10-CM | POA: Diagnosis present

## 2019-05-15 DIAGNOSIS — Z90710 Acquired absence of both cervix and uterus: Secondary | ICD-10-CM | POA: Diagnosis not present

## 2019-05-15 DIAGNOSIS — D62 Acute posthemorrhagic anemia: Secondary | ICD-10-CM | POA: Diagnosis not present

## 2019-05-15 DIAGNOSIS — Z992 Dependence on renal dialysis: Secondary | ICD-10-CM | POA: Diagnosis not present

## 2019-05-15 DIAGNOSIS — T82898A Other specified complication of vascular prosthetic devices, implants and grafts, initial encounter: Secondary | ICD-10-CM | POA: Diagnosis not present

## 2019-05-15 DIAGNOSIS — K219 Gastro-esophageal reflux disease without esophagitis: Secondary | ICD-10-CM | POA: Diagnosis present

## 2019-05-15 DIAGNOSIS — N186 End stage renal disease: Secondary | ICD-10-CM

## 2019-05-15 DIAGNOSIS — M19042 Primary osteoarthritis, left hand: Secondary | ICD-10-CM | POA: Diagnosis present

## 2019-05-15 DIAGNOSIS — D631 Anemia in chronic kidney disease: Secondary | ICD-10-CM | POA: Diagnosis present

## 2019-05-15 DIAGNOSIS — Z8673 Personal history of transient ischemic attack (TIA), and cerebral infarction without residual deficits: Secondary | ICD-10-CM | POA: Diagnosis not present

## 2019-05-15 DIAGNOSIS — Z811 Family history of alcohol abuse and dependence: Secondary | ICD-10-CM | POA: Diagnosis not present

## 2019-05-15 DIAGNOSIS — Z9841 Cataract extraction status, right eye: Secondary | ICD-10-CM | POA: Diagnosis not present

## 2019-05-15 DIAGNOSIS — Z79891 Long term (current) use of opiate analgesic: Secondary | ICD-10-CM | POA: Diagnosis not present

## 2019-05-15 DIAGNOSIS — E1122 Type 2 diabetes mellitus with diabetic chronic kidney disease: Secondary | ICD-10-CM | POA: Diagnosis present

## 2019-05-15 DIAGNOSIS — N250 Renal osteodystrophy: Secondary | ICD-10-CM | POA: Diagnosis present

## 2019-05-15 DIAGNOSIS — E119 Type 2 diabetes mellitus without complications: Secondary | ICD-10-CM | POA: Diagnosis not present

## 2019-05-15 DIAGNOSIS — Z7982 Long term (current) use of aspirin: Secondary | ICD-10-CM | POA: Diagnosis not present

## 2019-05-15 DIAGNOSIS — Z8249 Family history of ischemic heart disease and other diseases of the circulatory system: Secondary | ICD-10-CM | POA: Diagnosis not present

## 2019-05-15 DIAGNOSIS — Y832 Surgical operation with anastomosis, bypass or graft as the cause of abnormal reaction of the patient, or of later complication, without mention of misadventure at the time of the procedure: Secondary | ICD-10-CM | POA: Diagnosis present

## 2019-05-15 DIAGNOSIS — Z79899 Other long term (current) drug therapy: Secondary | ICD-10-CM | POA: Diagnosis not present

## 2019-05-15 DIAGNOSIS — I120 Hypertensive chronic kidney disease with stage 5 chronic kidney disease or end stage renal disease: Secondary | ICD-10-CM | POA: Diagnosis present

## 2019-05-15 DIAGNOSIS — Z7902 Long term (current) use of antithrombotics/antiplatelets: Secondary | ICD-10-CM | POA: Diagnosis not present

## 2019-05-15 NOTE — Telephone Encounter (Signed)
Received call from Ramiro Harvest, PA this morning; concerned about graft being inflammed and extremely red. Will work in with our PA this afternoon for wound check. She had Artegraft placed on 04-13-2019 by Dr. Trula Slade.

## 2019-05-15 NOTE — Progress Notes (Signed)
Established Dialysis Access   History of Present Illness   [name] is a 68 y.o. ([birth year]) female who presents with pain and swelling in right arm.  She is 4 weeks status post right arm AV graft placement with Artegraft by Dr. Trula Slade.  Prior to that she has had several dialysis access procedures in bilateral upper extremities.  She states over the past week she is noticed the area near her antecubitum incision has become larger, more painful, warm to touch, and red.  She denies any fevers, chills, nausea/vomiting.  She is dialyzing via right IJ tunneled dialysis catheter on a Monday Wednesday Friday schedule.  She is taking 2.5 mg of Coumadin daily which was prescribed by Dr. Trula Slade due to several failed dialysis access surgeries in the past.  Current Outpatient Medications  Medication Sig Dispense Refill  . amLODipine (NORVASC) 10 MG tablet Take 10 mg by mouth daily after lunch.     Marland Kitchen aspirin EC 81 MG tablet Take 81 mg by mouth daily after lunch.     . calcium carbonate (TUMS - DOSED IN MG ELEMENTAL CALCIUM) 500 MG chewable tablet Chew 1 tablet by mouth 2 (two) times daily. With lunch & supper    . carvedilol (COREG) 6.25 MG tablet Take 6.25 mg by mouth 2 (two) times daily.    . clopidogrel (PLAVIX) 75 MG tablet Take 1 tablet (75 mg total) by mouth daily. 30 tablet 11  . fluticasone (FLONASE) 50 MCG/ACT nasal spray Place 1 spray into both nostrils daily as needed for allergies or rhinitis.     Marland Kitchen insulin NPH-regular Human (NOVOLIN 70/30) (70-30) 100 UNIT/ML injection Inject 12-20 Units into the skin See admin instructions. Use twice daily per sliding scale: Blood sugar less than 150=12 units & Blood sugar 200 or more=20 units    . isosorbide mononitrate (IMDUR) 30 MG 24 hr tablet Take 30 mg by mouth daily after lunch.     . multivitamin (RENA-VIT) TABS tablet Take 1 tablet by mouth daily after lunch.     . oxyCODONE-acetaminophen (PERCOCET/ROXICET) 5-325 MG tablet Take 1 tablet  by mouth every 6 (six) hours as needed. 20 tablet 0   No current facility-administered medications for this visit.    Facility-Administered Medications Ordered in Other Visits  Medication Dose Route Frequency Provider Last Rate Last Dose  . 0.9 %  sodium chloride infusion   Intravenous Continuous Monia Sabal, PA-C        On ROS today: 10 system ROS is negative unless otherwise noted in HPI   Physical Examination   Vitals:   05/15/19 1439  BP: (!) 156/91  Pulse: (!) 103  Resp: 12  Temp: 100.1 F (37.8 C)  TempSrc: Temporal  SpO2: 98%  Weight: 155 lb (70.3 kg)  Height: 5\' 7"  (1.702 m)   Body mass index is 24.28 kg/m.  General Alert, O x 3, WD, NAD  Pulmonary Sym exp, good B air movt, CTA B  Cardiac RRR, Nl S1, S2  Vascular Vessel Right Left  Radial Palpable Palpable  Brachial Palpable Palpable  Ulnar Not palpable Not palpable    Musculo- skeletal  pulsatile 3 x 3 cm mass right antecubitum with surrounding erythema and overlying compromised tissue, no drainage from incision, palpable right radial pulse, unable to palpate flow through right arm AV graft  Neurologic A&O; CN grossly intact     Non-invasive Vascular Imaging   right Arm Access Duplex  (05/15/19):   Occluded right arm AV graft with  3 x 3 cm pseudoaneurysm    Medical Decision Making   [name] is a 68 y.o. female who presents with ESRD requiring hemodialysis.    Right arm dialysis duplex demonstrates an occluded right arm AV graft with a large 3 x 3 cm pseudoaneurysm at the arterial anastomosis  There is concern for spontaneous rupture as the overlying skin already appears to be compromised  Plan will be for removal of Artegraft and prior PTFE graft in right arm by Dr. Trula Slade tomorrow 05/16/2019  The patient will be admitted postoperatively  She will hold her Coumadin this evening  She has agreed to proceed with the above-mentioned surgery tomorrow   Dagoberto Ligas PA-C [facility] [place] Office: [phone]  Clinic MD: Dr. Trula Slade

## 2019-05-16 ENCOUNTER — Inpatient Hospital Stay (HOSPITAL_COMMUNITY): Payer: Medicare PPO | Admitting: Registered Nurse

## 2019-05-16 ENCOUNTER — Other Ambulatory Visit: Payer: Self-pay

## 2019-05-16 ENCOUNTER — Inpatient Hospital Stay (HOSPITAL_COMMUNITY)
Admission: RE | Admit: 2019-05-16 | Discharge: 2019-05-17 | DRG: 252 | Disposition: A | Discharge status: Home / Self Care | Payer: Medicare PPO | Attending: Surgery | Admitting: Surgery

## 2019-05-16 ENCOUNTER — Encounter (HOSPITAL_COMMUNITY): Payer: Self-pay

## 2019-05-16 ENCOUNTER — Inpatient Hospital Stay (HOSPITAL_COMMUNITY)
Admission: RE | Disposition: A | Discharge status: Home / Self Care | Payer: Self-pay | Source: Home / Self Care | Attending: Surgery

## 2019-05-16 DIAGNOSIS — E785 Hyperlipidemia, unspecified: Secondary | ICD-10-CM | POA: Diagnosis present

## 2019-05-16 DIAGNOSIS — Z811 Family history of alcohol abuse and dependence: Secondary | ICD-10-CM | POA: Diagnosis not present

## 2019-05-16 DIAGNOSIS — M19041 Primary osteoarthritis, right hand: Secondary | ICD-10-CM | POA: Diagnosis present

## 2019-05-16 DIAGNOSIS — Z7902 Long term (current) use of antithrombotics/antiplatelets: Secondary | ICD-10-CM | POA: Diagnosis not present

## 2019-05-16 DIAGNOSIS — Z79891 Long term (current) use of opiate analgesic: Secondary | ICD-10-CM

## 2019-05-16 DIAGNOSIS — Z8249 Family history of ischemic heart disease and other diseases of the circulatory system: Secondary | ICD-10-CM | POA: Diagnosis not present

## 2019-05-16 DIAGNOSIS — Z79899 Other long term (current) drug therapy: Secondary | ICD-10-CM | POA: Diagnosis not present

## 2019-05-16 DIAGNOSIS — Y832 Surgical operation with anastomosis, bypass or graft as the cause of abnormal reaction of the patient, or of later complication, without mention of misadventure at the time of the procedure: Secondary | ICD-10-CM | POA: Diagnosis present

## 2019-05-16 DIAGNOSIS — Z9841 Cataract extraction status, right eye: Secondary | ICD-10-CM

## 2019-05-16 DIAGNOSIS — Z992 Dependence on renal dialysis: Secondary | ICD-10-CM

## 2019-05-16 DIAGNOSIS — I728 Aneurysm of other specified arteries: Secondary | ICD-10-CM | POA: Diagnosis present

## 2019-05-16 DIAGNOSIS — Z90710 Acquired absence of both cervix and uterus: Secondary | ICD-10-CM

## 2019-05-16 DIAGNOSIS — Z7982 Long term (current) use of aspirin: Secondary | ICD-10-CM | POA: Diagnosis not present

## 2019-05-16 DIAGNOSIS — M19042 Primary osteoarthritis, left hand: Secondary | ICD-10-CM | POA: Diagnosis present

## 2019-05-16 DIAGNOSIS — Z1159 Encounter for screening for other viral diseases: Secondary | ICD-10-CM | POA: Diagnosis not present

## 2019-05-16 DIAGNOSIS — Z794 Long term (current) use of insulin: Secondary | ICD-10-CM | POA: Diagnosis not present

## 2019-05-16 DIAGNOSIS — E1122 Type 2 diabetes mellitus with diabetic chronic kidney disease: Secondary | ICD-10-CM | POA: Diagnosis present

## 2019-05-16 DIAGNOSIS — D631 Anemia in chronic kidney disease: Secondary | ICD-10-CM | POA: Diagnosis present

## 2019-05-16 DIAGNOSIS — N250 Renal osteodystrophy: Secondary | ICD-10-CM | POA: Diagnosis present

## 2019-05-16 DIAGNOSIS — N186 End stage renal disease: Secondary | ICD-10-CM | POA: Diagnosis present

## 2019-05-16 DIAGNOSIS — T82898A Other specified complication of vascular prosthetic devices, implants and grafts, initial encounter: Secondary | ICD-10-CM | POA: Diagnosis not present

## 2019-05-16 DIAGNOSIS — I120 Hypertensive chronic kidney disease with stage 5 chronic kidney disease or end stage renal disease: Secondary | ICD-10-CM | POA: Diagnosis present

## 2019-05-16 DIAGNOSIS — Z833 Family history of diabetes mellitus: Secondary | ICD-10-CM | POA: Diagnosis not present

## 2019-05-16 DIAGNOSIS — D62 Acute posthemorrhagic anemia: Secondary | ICD-10-CM | POA: Diagnosis not present

## 2019-05-16 DIAGNOSIS — I4891 Unspecified atrial fibrillation: Secondary | ICD-10-CM | POA: Diagnosis present

## 2019-05-16 DIAGNOSIS — T827XXA Infection and inflammatory reaction due to other cardiac and vascular devices, implants and grafts, initial encounter: Secondary | ICD-10-CM | POA: Diagnosis present

## 2019-05-16 DIAGNOSIS — Z8673 Personal history of transient ischemic attack (TIA), and cerebral infarction without residual deficits: Secondary | ICD-10-CM | POA: Diagnosis not present

## 2019-05-16 DIAGNOSIS — K219 Gastro-esophageal reflux disease without esophagitis: Secondary | ICD-10-CM | POA: Diagnosis present

## 2019-05-16 HISTORY — PX: AVGG REMOVAL: SHX5153

## 2019-05-16 LAB — PROTIME-INR
INR: 1.3 — ABNORMAL HIGH (ref 0.8–1.2)
Prothrombin Time: 16.3 seconds — ABNORMAL HIGH (ref 11.4–15.2)

## 2019-05-16 LAB — GLUCOSE, CAPILLARY
Glucose-Capillary: 145 mg/dL — ABNORMAL HIGH (ref 70–99)
Glucose-Capillary: 125 mg/dL — ABNORMAL HIGH (ref 70–99)
Glucose-Capillary: 108 mg/dL — ABNORMAL HIGH (ref 70–99)
Glucose-Capillary: 166 mg/dL — ABNORMAL HIGH (ref 70–99)

## 2019-05-16 LAB — POCT I-STAT 4, (NA,K, GLUC, HGB,HCT)
Glucose, Bld: 146 mg/dL — ABNORMAL HIGH (ref 70–99)
HCT: 29 % — ABNORMAL LOW (ref 36.0–46.0)
Hemoglobin: 9.9 g/dL — ABNORMAL LOW (ref 12.0–15.0)
Potassium: 2.8 mmol/L — ABNORMAL LOW (ref 3.5–5.1)
Sodium: 139 mmol/L (ref 135–145)

## 2019-05-16 LAB — SARS CORONAVIRUS 2 BY RT PCR (HOSPITAL ORDER, PERFORMED IN ~~LOC~~ HOSPITAL LAB): SARS Coronavirus 2: NEGATIVE

## 2019-05-16 LAB — HEMOGLOBIN A1C
Hgb A1c MFr Bld: 7.5 % — ABNORMAL HIGH (ref 4.8–5.6)
Mean Plasma Glucose: 168.55 mg/dL

## 2019-05-16 SURGERY — REMOVAL OF ARTERIOVENOUS GORETEX GRAFT (AVGG)
Anesthesia: General | Site: Arm Upper | Laterality: Right

## 2019-05-16 MED ORDER — LIDOCAINE 2% (20 MG/ML) 5 ML SYRINGE
INTRAMUSCULAR | Status: DC | PRN
  Administered 2019-05-16: 60 mg via INTRAVENOUS

## 2019-05-16 MED ORDER — SODIUM CHLORIDE 0.9 % IV SOLN
INTRAVENOUS | Status: DC | PRN
  Administered 2019-05-16: 35 ug/min via INTRAVENOUS

## 2019-05-16 MED ORDER — CARVEDILOL 6.25 MG PO TABS
6.2500 mg | ORAL_TABLET | Freq: Two times a day (BID) | ORAL | Status: DC
  Administered 2019-05-16 – 2019-05-17 (×2): 6.25 mg via ORAL
  Filled 2019-05-16 (×3): qty 1

## 2019-05-16 MED ORDER — AMLODIPINE BESYLATE 10 MG PO TABS
10.0000 mg | ORAL_TABLET | Freq: Every day | ORAL | Status: DC
  Administered 2019-05-16 – 2019-05-17 (×2): 10 mg via ORAL
  Filled 2019-05-16 (×2): qty 1

## 2019-05-16 MED ORDER — FENTANYL CITRATE (PF) 100 MCG/2ML IJ SOLN: 25.0000 ug | INTRAMUSCULAR | Status: DC | PRN

## 2019-05-16 MED ORDER — PHENOL 1.4 % MT LIQD: 1.0000 | OROMUCOSAL | Status: DC | PRN

## 2019-05-16 MED ORDER — ACETAMINOPHEN 325 MG RE SUPP: 325.0000 mg | RECTAL | Status: DC | PRN

## 2019-05-16 MED ORDER — INSULIN ASPART 100 UNIT/ML ~~LOC~~ SOLN: 0.0000 [IU] | Freq: Three times a day (TID) | SUBCUTANEOUS | Status: DC

## 2019-05-16 MED ORDER — PROPOFOL 500 MG/50ML IV EMUL
INTRAVENOUS | Status: DC | PRN
  Administered 2019-05-16: 75 ug/kg/min via INTRAVENOUS

## 2019-05-16 MED ORDER — METOPROLOL TARTRATE 5 MG/5ML IV SOLN: 2.0000 mg | INTRAVENOUS | Status: DC | PRN

## 2019-05-16 MED ORDER — PAPAVERINE HCL 30 MG/ML IJ SOLN
INTRAMUSCULAR | Status: AC
  Filled 2019-05-16: qty 2

## 2019-05-16 MED ORDER — EPHEDRINE SULFATE-NACL 50-0.9 MG/10ML-% IV SOSY
PREFILLED_SYRINGE | INTRAVENOUS | Status: DC | PRN
  Administered 2019-05-16 (×2): 10 mg via INTRAVENOUS

## 2019-05-16 MED ORDER — CARVEDILOL 3.125 MG PO TABS
6.2500 mg | ORAL_TABLET | Freq: Once | ORAL | Status: AC
  Administered 2019-05-16: 6.25 mg via ORAL
  Filled 2019-05-16: qty 2

## 2019-05-16 MED ORDER — SODIUM CHLORIDE 0.9 % IV SOLN
INTRAVENOUS | Status: DC
  Administered 2019-05-16: 11:00:00 via INTRAVENOUS

## 2019-05-16 MED ORDER — LABETALOL HCL 5 MG/ML IV SOLN: 10.0000 mg | INTRAVENOUS | Status: DC | PRN

## 2019-05-16 MED ORDER — CALCIUM CARBONATE ANTACID 500 MG PO CHEW
1.0000 | CHEWABLE_TABLET | Freq: Two times a day (BID) | ORAL | Status: DC
  Administered 2019-05-16 – 2019-05-17 (×3): 200 mg via ORAL
  Filled 2019-05-16 (×3): qty 1

## 2019-05-16 MED ORDER — ONDANSETRON HCL 4 MG/2ML IJ SOLN
INTRAMUSCULAR | Status: DC | PRN
  Administered 2019-05-16: 4 mg via INTRAVENOUS

## 2019-05-16 MED ORDER — CEFAZOLIN SODIUM-DEXTROSE 2-4 GM/100ML-% IV SOLN
2.0000 g | INTRAVENOUS | Status: AC
  Administered 2019-05-16: 2 g via INTRAVENOUS

## 2019-05-16 MED ORDER — PROPOFOL 10 MG/ML IV BOLUS
INTRAVENOUS | Status: DC | PRN
  Administered 2019-05-16: 100 mg via INTRAVENOUS
  Administered 2019-05-16 (×2): 20 mg via INTRAVENOUS

## 2019-05-16 MED ORDER — PHENYLEPHRINE 40 MCG/ML (10ML) SYRINGE FOR IV PUSH (FOR BLOOD PRESSURE SUPPORT)
PREFILLED_SYRINGE | INTRAVENOUS | Status: DC | PRN
  Administered 2019-05-16 (×3): 120 ug via INTRAVENOUS

## 2019-05-16 MED ORDER — SODIUM CHLORIDE 0.9 % IV SOLN
INTRAVENOUS | Status: DC | PRN
  Administered 2019-05-16: 500 mL

## 2019-05-16 MED ORDER — CHLORHEXIDINE GLUCONATE CLOTH 2 % EX PADS
6.0000 | MEDICATED_PAD | Freq: Every day | CUTANEOUS | Status: DC
  Administered 2019-05-17: 6 via TOPICAL

## 2019-05-16 MED ORDER — ONDANSETRON HCL 4 MG/2ML IJ SOLN
INTRAMUSCULAR | Status: AC
  Filled 2019-05-16: qty 2

## 2019-05-16 MED ORDER — ASPIRIN EC 81 MG PO TBEC
81.0000 mg | DELAYED_RELEASE_TABLET | Freq: Every day | ORAL | Status: DC
  Administered 2019-05-16 – 2019-05-17 (×2): 81 mg via ORAL
  Filled 2019-05-16 (×2): qty 1

## 2019-05-16 MED ORDER — OXYCODONE-ACETAMINOPHEN 5-325 MG PO TABS: 1.0000 | ORAL_TABLET | ORAL | Status: DC | PRN

## 2019-05-16 MED ORDER — PROTAMINE SULFATE 10 MG/ML IV SOLN
INTRAVENOUS | Status: DC | PRN
  Administered 2019-05-16: 40 mg via INTRAVENOUS

## 2019-05-16 MED ORDER — SODIUM CHLORIDE 0.9 % IV SOLN
INTRAVENOUS | Status: AC
  Filled 2019-05-16: qty 1.2

## 2019-05-16 MED ORDER — FENTANYL CITRATE (PF) 250 MCG/5ML IJ SOLN
INTRAMUSCULAR | Status: AC
  Filled 2019-05-16: qty 5

## 2019-05-16 MED ORDER — ACETAMINOPHEN 325 MG PO TABS: 325.0000 mg | ORAL_TABLET | ORAL | Status: DC | PRN

## 2019-05-16 MED ORDER — FENTANYL CITRATE (PF) 100 MCG/2ML IJ SOLN
INTRAMUSCULAR | Status: DC | PRN
  Administered 2019-05-16: 50 ug via INTRAVENOUS
  Administered 2019-05-16: 25 ug via INTRAVENOUS
  Administered 2019-05-16: 50 ug via INTRAVENOUS
  Administered 2019-05-16: 25 ug via INTRAVENOUS

## 2019-05-16 MED ORDER — THROMBIN (RECOMBINANT) 20000 UNITS EX SOLR
CUTANEOUS | Status: AC
  Filled 2019-05-16: qty 20000

## 2019-05-16 MED ORDER — HEPARIN SODIUM (PORCINE) 1000 UNIT/ML IJ SOLN
INTRAMUSCULAR | Status: DC | PRN
  Administered 2019-05-16: 7000 [IU] via INTRAVENOUS

## 2019-05-16 MED ORDER — PHENYLEPHRINE 40 MCG/ML (10ML) SYRINGE FOR IV PUSH (FOR BLOOD PRESSURE SUPPORT)
PREFILLED_SYRINGE | INTRAVENOUS | Status: AC
  Filled 2019-05-16: qty 10

## 2019-05-16 MED ORDER — LIDOCAINE HCL (PF) 1 % IJ SOLN
INTRAMUSCULAR | Status: AC
  Filled 2019-05-16: qty 30

## 2019-05-16 MED ORDER — BACITRACIN ZINC 500 UNIT/GM EX OINT
TOPICAL_OINTMENT | CUTANEOUS | Status: AC
  Filled 2019-05-16: qty 28.35

## 2019-05-16 MED ORDER — PROTAMINE SULFATE 10 MG/ML IV SOLN
INTRAVENOUS | Status: AC
  Filled 2019-05-16: qty 5

## 2019-05-16 MED ORDER — LIDOCAINE-EPINEPHRINE (PF) 1 %-1:200000 IJ SOLN
INTRAMUSCULAR | Status: AC
  Filled 2019-05-16: qty 30

## 2019-05-16 MED ORDER — OXYCODONE HCL 5 MG/5ML PO SOLN: 5.0000 mg | Freq: Once | ORAL | Status: DC | PRN

## 2019-05-16 MED ORDER — ALUM & MAG HYDROXIDE-SIMETH 200-200-20 MG/5ML PO SUSP: 15.0000 mL | ORAL | Status: DC | PRN

## 2019-05-16 MED ORDER — LIDOCAINE 2% (20 MG/ML) 5 ML SYRINGE
INTRAMUSCULAR | Status: AC
  Filled 2019-05-16: qty 5

## 2019-05-16 MED ORDER — HEPARIN SODIUM (PORCINE) 1000 UNIT/ML IJ SOLN
INTRAMUSCULAR | Status: AC
  Filled 2019-05-16: qty 1

## 2019-05-16 MED ORDER — SODIUM CHLORIDE 0.9 % IV SOLN
INTRAVENOUS | Status: AC
  Filled 2019-05-16: qty 500000

## 2019-05-16 MED ORDER — HYDRALAZINE HCL 20 MG/ML IJ SOLN: 5.0000 mg | INTRAMUSCULAR | Status: DC | PRN

## 2019-05-16 MED ORDER — OXYCODONE HCL 5 MG PO TABS: 5.0000 mg | ORAL_TABLET | Freq: Once | ORAL | Status: DC | PRN

## 2019-05-16 MED ORDER — LIDOCAINE HCL 1 % IJ SOLN
INTRAMUSCULAR | Status: DC | PRN
  Administered 2019-05-16: 10 mL

## 2019-05-16 MED ORDER — 0.9 % SODIUM CHLORIDE (POUR BTL) OPTIME
TOPICAL | Status: DC | PRN
  Administered 2019-05-16: 1000 mL

## 2019-05-16 MED ORDER — ISOSORBIDE MONONITRATE ER 30 MG PO TB24
30.0000 mg | ORAL_TABLET | Freq: Every day | ORAL | Status: DC
  Administered 2019-05-16 – 2019-05-17 (×2): 30 mg via ORAL
  Filled 2019-05-16 (×2): qty 1

## 2019-05-16 MED ORDER — POTASSIUM CHLORIDE 10 MEQ/100ML IV SOLN
10.0000 meq | INTRAVENOUS | Status: AC
  Administered 2019-05-16 (×2): 10 meq via INTRAVENOUS
  Filled 2019-05-16: qty 100

## 2019-05-16 MED ORDER — ONDANSETRON HCL 4 MG/2ML IJ SOLN: 4.0000 mg | Freq: Four times a day (QID) | INTRAMUSCULAR | Status: DC | PRN

## 2019-05-16 MED ORDER — MORPHINE SULFATE (PF) 2 MG/ML IV SOLN: 2.0000 mg | INTRAVENOUS | Status: DC | PRN

## 2019-05-16 MED ORDER — GUAIFENESIN-DM 100-10 MG/5ML PO SYRP: 15.0000 mL | ORAL_SOLUTION | ORAL | Status: DC | PRN

## 2019-05-16 MED ORDER — CEFAZOLIN SODIUM-DEXTROSE 2-4 GM/100ML-% IV SOLN
INTRAVENOUS | Status: AC
  Filled 2019-05-16: qty 100

## 2019-05-16 MED ORDER — THROMBIN 20000 UNITS EX SOLR
OROMUCOSAL | Status: DC | PRN
  Administered 2019-05-16: 20 mL via TOPICAL

## 2019-05-16 MED ORDER — PANTOPRAZOLE SODIUM 40 MG PO TBEC
40.0000 mg | DELAYED_RELEASE_TABLET | Freq: Every day | ORAL | Status: DC
  Administered 2019-05-16 – 2019-05-17 (×2): 40 mg via ORAL
  Filled 2019-05-16 (×2): qty 1

## 2019-05-16 MED ORDER — CEFAZOLIN SODIUM-DEXTROSE 2-4 GM/100ML-% IV SOLN
2.0000 g | Freq: Once | INTRAVENOUS | Status: AC
  Administered 2019-05-17: 2 g via INTRAVENOUS
  Filled 2019-05-16: qty 100

## 2019-05-16 MED ORDER — ONDANSETRON HCL 4 MG/2ML IJ SOLN: 4.0000 mg | Freq: Once | INTRAMUSCULAR | Status: DC | PRN

## 2019-05-16 SURGICAL SUPPLY — 54 items
BANDAGE ACE 4X5 VEL STRL LF (GAUZE/BANDAGES/DRESSINGS) ×1 IMPLANT
BANDAGE ELASTIC 4 VELCRO ST LF (GAUZE/BANDAGES/DRESSINGS) ×1 IMPLANT
BLADE SURG 15 STRL LF DISP TIS (BLADE) IMPLANT
BLADE SURG 15 STRL SS (BLADE) ×1
BNDG ESMARK 4X9 LF (GAUZE/BANDAGES/DRESSINGS) ×1 IMPLANT
CANISTER SUCT 3000ML PPV (MISCELLANEOUS) ×2 IMPLANT
CANNULA VESSEL 3MM 2 BLNT TIP (CANNULA) ×2 IMPLANT
CATH EMB 3FR 40CM (CATHETERS) ×1 IMPLANT
CLIP VESOCCLUDE MED 6/CT (CLIP) ×2 IMPLANT
CLIP VESOCCLUDE SM WIDE 6/CT (CLIP) ×2 IMPLANT
COVER WAND RF STERILE (DRAPES) ×1 IMPLANT
CUFF TOURN SGL QUICK 18X4 (TOURNIQUET CUFF) ×1 IMPLANT
DECANTER SPIKE VIAL GLASS SM (MISCELLANEOUS) ×2 IMPLANT
DERMABOND ADVANCED (GAUZE/BANDAGES/DRESSINGS) ×1
DERMABOND ADVANCED .7 DNX12 (GAUZE/BANDAGES/DRESSINGS) ×1 IMPLANT
DRSG COVADERM 4X6 (GAUZE/BANDAGES/DRESSINGS) ×3 IMPLANT
ELECT REM PT RETURN 9FT ADLT (ELECTROSURGICAL) ×2
ELECTRODE REM PT RTRN 9FT ADLT (ELECTROSURGICAL) ×1 IMPLANT
GAUZE 4X4 16PLY RFD (DISPOSABLE) ×1 IMPLANT
GAUZE SPONGE 4X4 12PLY STRL (GAUZE/BANDAGES/DRESSINGS) ×1 IMPLANT
GLOVE BIOGEL PI IND STRL 6 (GLOVE) IMPLANT
GLOVE BIOGEL PI IND STRL 7.5 (GLOVE) ×1 IMPLANT
GLOVE BIOGEL PI INDICATOR 6 (GLOVE) ×1
GLOVE BIOGEL PI INDICATOR 7.5 (GLOVE)
GLOVE INDICATOR 8.0 STRL GRN (GLOVE) ×1 IMPLANT
GLOVE SURG SS PI 7.5 STRL IVOR (GLOVE) ×1 IMPLANT
GOWN STRL REUS W/ TWL LRG LVL3 (GOWN DISPOSABLE) ×2 IMPLANT
GOWN STRL REUS W/ TWL XL LVL3 (GOWN DISPOSABLE) ×1 IMPLANT
GOWN STRL REUS W/TWL LRG LVL3 (GOWN DISPOSABLE) ×3
GOWN STRL REUS W/TWL XL LVL3 (GOWN DISPOSABLE)
HEMOSTAT SNOW SURGICEL 2X4 (HEMOSTASIS) IMPLANT
KIT BASIN OR (CUSTOM PROCEDURE TRAY) ×2 IMPLANT
KIT TURNOVER KIT B (KITS) ×2 IMPLANT
NDL HYPO 25GX1X1/2 BEV (NEEDLE) ×1 IMPLANT
NEEDLE HYPO 25GX1X1/2 BEV (NEEDLE) IMPLANT
NS IRRIG 1000ML POUR BTL (IV SOLUTION) ×2 IMPLANT
PACK CV ACCESS (CUSTOM PROCEDURE TRAY) ×2 IMPLANT
PAD ARMBOARD 7.5X6 YLW CONV (MISCELLANEOUS) ×4 IMPLANT
PAD CAST 4YDX4 CTTN HI CHSV (CAST SUPPLIES) IMPLANT
PADDING CAST COTTON 4X4 STRL (CAST SUPPLIES) ×1
SPONGE LAP 18X18 RF (DISPOSABLE) ×1 IMPLANT
SPONGE SURGIFOAM ABS GEL 100 (HEMOSTASIS) ×1 IMPLANT
SUT ETHILON 3 0 PS 1 (SUTURE) ×3 IMPLANT
SUT PROLENE 5 0 C 1 24 (SUTURE) ×2 IMPLANT
SUT PROLENE 6 0 BV (SUTURE) ×4 IMPLANT
SUT VIC AB 3-0 SH 27 (SUTURE) ×2
SUT VIC AB 3-0 SH 27X BRD (SUTURE) ×1 IMPLANT
SUT VICRYL 4-0 PS2 18IN ABS (SUTURE) ×2 IMPLANT
SWAB CULTURE LIQ STUART DBL (MISCELLANEOUS) ×1 IMPLANT
SWAB CULTURE LIQUID MINI MALE (MISCELLANEOUS) ×1 IMPLANT
SYR TB 1ML LUER SLIP (SYRINGE) ×1 IMPLANT
TOWEL GREEN STERILE (TOWEL DISPOSABLE) ×2 IMPLANT
UNDERPAD 30X30 (UNDERPADS AND DIAPERS) ×2 IMPLANT
WATER STERILE IRR 1000ML POUR (IV SOLUTION) ×2 IMPLANT

## 2019-05-16 NOTE — Anesthesia Preprocedure Evaluation (Signed)
Anesthesia Evaluation  Patient identified by MRN, date of birth, ID band Patient awake    Reviewed: Allergy & Precautions, NPO status , Patient's Chart, lab work & pertinent test results, reviewed documented beta blocker date and time   History of Anesthesia Complications Negative for: history of anesthetic complications  Airway Mallampati: II  TM Distance: >3 FB Neck ROM: Full    Dental  (+) Dental Advisory Given, Partial Upper   Pulmonary neg pulmonary ROS,    Pulmonary exam normal        Cardiovascular hypertension, Pt. on medications and Pt. on home beta blockers Normal cardiovascular exam     Neuro/Psych  Headaches, CVA, No Residual Symptoms negative psych ROS   GI/Hepatic Neg liver ROS, GERD  Medicated and Controlled,  Endo/Other  diabetes, Insulin Dependent  Renal/GU ESRF and DialysisRenal disease     Musculoskeletal  (+) Arthritis ,   Abdominal   Peds  Hematology negative hematology ROS (+)   Anesthesia Other Findings   Reproductive/Obstetrics                             Anesthesia Physical  Anesthesia Plan  ASA: IV  Anesthesia Plan: MAC   Post-op Pain Management:    Induction: Intravenous  PONV Risk Score and Plan: 2 and Treatment may vary due to age or medical condition, Ondansetron and Propofol infusion  Airway Management Planned: Natural Airway, Nasal Cannula and Simple Face Mask  Additional Equipment: None  Intra-op Plan:   Post-operative Plan: Extubation in OR  Informed Consent: I have reviewed the patients History and Physical, chart, labs and discussed the procedure including the risks, benefits and alternatives for the proposed anesthesia with the patient or authorized representative who has indicated his/her understanding and acceptance.     Dental advisory given  Plan Discussed with:   Anesthesia Plan Comments:         Anesthesia Quick  Evaluation

## 2019-05-16 NOTE — Interval H&P Note (Signed)
History and Physical Interval Note:  05/16/2019 10:10 AM  [name]  has presented today for surgery, with the diagnosis of INFECTED RIGHT ARM GRAFT.  The various methods of treatment have been discussed with the patient and family. After consideration of risks, benefits and other options for treatment, the patient has consented to  Procedure(s): REMOVAL OF ARTERIOVENOUS GORETEX GRAFT ([place]) RIGHT ARM (Right) as a surgical intervention.  The patient's history has been reviewed, patient examined, no change in status, stable for surgery.  I have reviewed the patient's chart and labs.  Questions were answered to the patient's satisfaction.     Deitra Mayo

## 2019-05-16 NOTE — Anesthesia Procedure Notes (Signed)
Procedure Name: MAC Date/Time: 05/16/2019 11:43 AM Performed by: Trinna Post., CRNA Pre-anesthesia Checklist: Patient identified, Emergency Drugs available, Suction available, Patient being monitored and Timeout performed Patient Re-evaluated:Patient Re-evaluated prior to induction Oxygen Delivery Method: Simple face mask Preoxygenation: Pre-oxygenation with 100% oxygen Induction Type: IV induction Placement Confirmation: positive ETCO2

## 2019-05-16 NOTE — Transfer of Care (Signed)
Immediate Anesthesia Transfer of Care Note  Patient: [name]  Procedure(s) Performed: REMOVAL OF ARTERIOVENOUS GORETEX GRAFT ([place]) RIGHT ARM (Right Arm Upper)  Patient Location: PACU  Anesthesia Type:General  Level of Consciousness: awake, alert  and oriented  Airway & Oxygen Therapy: Patient Spontanous Breathing and Patient connected to face mask oxygen  Post-op Assessment: Report given to RN and Post -op Vital signs reviewed and stable  Post vital signs: Reviewed and stable  Last Vitals:  Vitals Value Taken Time  BP 101/66 05/16/19 1405  Temp    Pulse 73 05/16/19 1407  Resp 30 05/16/19 1407  SpO2 97 % 05/16/19 1407  Vitals shown include unvalidated device data.  Last Pain:  Vitals:   05/16/19 1017  TempSrc:   PainSc: 0-No pain         Complications: No apparent anesthesia complications

## 2019-05-16 NOTE — Progress Notes (Signed)
Renal Navigator notified OP HD clinic/Davita Butler of patient's admission and negative COVID 19 rapid test result to provide continuity of care and safety.  Alphonzo Cruise, [place] Renal Navigator [phone]

## 2019-05-16 NOTE — Progress Notes (Signed)
Pt transferred to 4E-20 from PACU via bed. CHG bath given. Tele applied, CCMD notified. Pt oriented to call light, bed, and room. Call bell within reach. VSS. Will continue to monitor.  Amanda Cockayne, RN

## 2019-05-16 NOTE — Progress Notes (Signed)
Dr. Christella Hartigan notified patient's K+ was 2.8, Hgb 9.9 on Istat.  Received order to administer 2 runs of potassium.  Also notified Dr. Christella Hartigan that patient did not take coreg PTA.  Received order to give patient's home dose of 6.25mg  coreg.  Dr. Scot Dock notified of patient's labs.

## 2019-05-16 NOTE — Anesthesia Procedure Notes (Signed)
Procedure Name: LMA Insertion Date/Time: 05/16/2019 12:20 PM Performed by: Trinna Post., CRNA Pre-anesthesia Checklist: Patient identified, Emergency Drugs available, Suction available, Patient being monitored and Timeout performed Patient Re-evaluated:Patient Re-evaluated prior to induction Oxygen Delivery Method: Circle system utilized Preoxygenation: Pre-oxygenation with 100% oxygen Induction Type: IV induction LMA: LMA inserted LMA Size: 4.0 Number of attempts: 1 Placement Confirmation: positive ETCO2 and breath sounds checked- equal and bilateral Tube secured with: Tape Dental Injury: Teeth and Oropharynx as per pre-operative assessment

## 2019-05-16 NOTE — Interval H&P Note (Signed)
History and Physical Interval Note:  05/16/2019 10:10 AM  [place] Fun[place]  has presented today for surgery, with the diagnosis of INFECTED RIGHT ARM GRAFT.  The various methods of treatment have been discussed with the patient and family. After consideration of risks, benefits and other options for treatment, the patient has consented to  Procedure(s): REMOVAL OF ARTERIOVENOUS GORETEX GRAFT ([place]) RIGHT ARM (Right) as a surgical intervention.  The patient's history has been reviewed, patient examined, no change in status, stable for surgery.  I have reviewed the patient's chart and labs.  Questions were answered to the patient's satisfaction.     Deitra Mayo

## 2019-05-16 NOTE — Consult Note (Signed)
Reason for Consult: ESRD Referring Physician:  Dr. Scot Dock  Chief Complaint: Infected AVG  Assessment/Plan: 1. ESRD - Will plan on HD in the AM to resume MWF regimen. No acute indication for HD today. 2. Renal osteodystrophy - currently on TUMS 1 tab BID (lunch and dinner); will check a phos to facilitate management. 3. HTN - well controlled. Restart CCP and BBlr (home regimen). 4. CASHD 5. DM 6. Disrupted Artegraft w/ possible infection removed in its entirety by Dr. Scot Dock on 05/16/2019.    HPI: [name] is an 68 y.o. female 100mm RUA ARtegraft placed on 04/13/2019 after numerous failed accesses. She had a pusatile mass overlying the arterial anastomosis with associated pseudoaneurysm and overlying cellulitis. She was taken to the OR by Dr. Scot Dock who was able to repair the disrupted arterial anastomosis with a harvested axillary vein patch + removal of the Artegraft in its entirety. Pt has a medical history significant for DM HTN Afib CVA and ESRD currently dialyzing MWF w/ Dr. Carson Myrtle at [facility]. Her last treatment was on Monday and she reports no issues on dialysis. She denies dyspnea, CP, fever, chills, sore throat.    ROS Pertinent items are noted in HPI.  Chemistry and CBC: Creatinine, Ser  Date/Time Value Ref Range Status  04/06/2019 08:04 AM 5.45 (H) 0.44 - 1.00 mg/dL Final  10/27/2018 06:19 AM 3.70 (H) 0.44 - 1.00 mg/dL Final   Recent Labs  Lab 05/16/19 1014  NA 139  K 2.8*  GLUCOSE 146*   Recent Labs  Lab 05/16/19 1014  HGB 9.9*  HCT 29.0*   Liver Function Tests: No results for input(s): AST, ALT, ALKPHOS, BILITOT, PROT, ALBUMIN in the last 168 hours. No results for input(s): LIPASE, AMYLASE in the last 168 hours. No results for input(s): AMMONIA in the last 168 hours. Cardiac Enzymes: No results for input(s): CKTOTAL, CKMB, CKMBINDEX, TROPONINI in the last 168 hours. Iron Studies: No results for input(s): IRON, TIBC, TRANSFERRIN, FERRITIN in  the last 72 hours. PT/INR: @LABRCNTIP (inr:5)  Xrays/Other Studies: ) Results for orders placed or performed during the hospital encounter of 05/16/19 (from the past 48 hour(s))  Glucose, capillary     Status: Abnormal   Collection Time: 05/16/19  9:27 AM  Result Value Ref Range   Glucose-Capillary 145 (H) 70 - 99 mg/dL  SARS Coronavirus 2 (CEPHEID - Performed in [place] hospital lab), Hosp Order     Status: None   Collection Time: 05/16/19 10:00 AM   Specimen: Nasopharyngeal Swab  Result Value Ref Range   SARS Coronavirus 2 NEGATIVE NEGATIVE    Comment: (NOTE) If result is NEGATIVE SARS-CoV-2 target nucleic acids are NOT DETECTED. The SARS-CoV-2 RNA is generally detectable in upper and lower  respiratory specimens during the acute phase of infection. The lowest  concentration of SARS-CoV-2 viral copies this assay can detect is 250  copies / mL. A negative result does not preclude SARS-CoV-2 infection  and should not be used as the sole basis for treatment or other  patient management decisions.  A negative result may occur with  improper specimen collection / handling, submission of specimen other  than nasopharyngeal swab, presence of viral mutation(s) within the  areas targeted by this assay, and inadequate number of viral copies  (<250 copies / mL). A negative result must be combined with clinical  observations, patient history, and epidemiological information. If result is POSITIVE SARS-CoV-2 target nucleic acids are DETECTED. The SARS-CoV-2 RNA is generally detectable in upper and lower  respiratory specimens dur ing the acute phase of infection.  Positive  results are indicative of active infection with SARS-CoV-2.  Clinical  correlation with patient history and other diagnostic information is  necessary to determine patient infection status.  Positive results do  not rule out bacterial infection or co-infection with other viruses. If result is PRESUMPTIVE  POSTIVE SARS-CoV-2 nucleic acids MAY BE PRESENT.   A presumptive positive result was obtained on the submitted specimen  and confirmed on repeat testing.  While 2019 novel coronavirus  (SARS-CoV-2) nucleic acids may be present in the submitted sample  additional confirmatory testing may be necessary for epidemiological  and / or clinical management purposes  to differentiate between  SARS-CoV-2 and other Sarbecovirus currently known to infect humans.  If clinically indicated additional testing with an alternate test  methodology [phone]) is advised. The SARS-CoV-2 RNA is generally  detectable in upper and lower respiratory sp ecimens during the acute  phase of infection. The expected result is Negative. Fact Sheet for Patients:  StrictlyIdeas.no Fact Sheet for Healthcare Providers: BankingDealers.co.za This test is not yet approved or cleared by the [place] FDA and has been authorized for detection and/or diagnosis of SARS-CoV-2 by FDA under an Emergency Use Authorization (EUA).  This EUA will remain in effect (meaning this test can be used) for the duration of the COVID-19 declaration under Section 564(b)(1) of the Act, 21 U.S.C. section 360bbb-3(b)(1), unless the authorization is terminated or revoked sooner. Performed at [facility], [place] [address] [place], [place] [address]   I-STAT 4, (NA,K, GLUC, HGB,HCT)     Status: Abnormal   Collection Time: 05/16/19 10:14 AM  Result Value Ref Range   Sodium 139 135 - 145 mmol/L   Potassium 2.8 (L) 3.5 - 5.1 mmol/L   Glucose, Bld 146 (H) 70 - 99 mg/dL   HCT 29.0 (L) 36.0 - 46.0 %   Hemoglobin 9.9 (L) 12.0 - 15.0 g/dL  Glucose, capillary     Status: Abnormal   Collection Time: 05/16/19  2:07 PM  Result Value Ref Range   Glucose-Capillary 125 (H) 70 - 99 mg/dL   Vas US Duplex Dialysis Access (avf,avg)  Result Date: 05/15/2019 DIALYSIS ACCESS Reason for Exam: Infected access.  Access Site: Right Upper Extremity. Access Type: Upper arm straight graft. History: 04/13/2019: Placement of a new right upper extremity dialysis graft (6          mm Artegraft). Performing Technologist: Burley Saver RVT  Examination Guidelines: A complete evaluation includes B-mode imaging, spectral Doppler, color Doppler, and power Doppler as needed of all accessible portions of each vessel. Unilateral testing is considered an integral part of a complete examination. Limited examinations for reoccurring indications may be performed as noted.  Findings:    Summary: 3.18cm x 2.91 cm pseudoaneurysm noted at the distal upper arm anastomosis. Graft appears thrombosed from the anastomosis to the proximal axillary vein.  *See table(s) above for measurements and observations.  Diagnosing physician: Harold Barban MD Electronically signed by Harold Barban MD on 05/15/2019 at 4:33:00 PM.   --------------------------------------------------------------------------------   Final     PMH:   Past Medical History:  Diagnosis Date  . Arthritis    hands  . Constipation   . Diabetes mellitus without complication (HCC)    Type II  . ESRD (end stage renal disease) ([place])     M/W/F- Hemodialysis  . GERD (gastroesophageal reflux disease)   . Headache   . Hyperlipidemia   . Hypertension   .  Irregular heart rate   . Stroke [facility])    TIA - approx 2010- no residual    PSH:   Past Surgical History:  Procedure Laterality Date  . ABDOMINAL HYSTERECTOMY    . AORTIC ARCH ANGIOGRAPHY N/A 03/28/2019   Procedure: AORTIC ARCH ANGIOGRAPHY;  Surgeon: Waynetta Sandy, MD;  Location: [place] [facility];  Service: Cardiovascular;  Laterality: N/A;  . AV FISTULA PLACEMENT Left 06/13/2018   Procedure: ARTERIOVENOUS (AV) FISTULA CREATION;  Surgeon: Rosetta Posner, MD;  Location: [place];  Service: Vascular;  Laterality: Left;  . AV FISTULA PLACEMENT Left 08/18/2018   Procedure: INSERTION OF 4-7MM X 45CM ARTERIOVENOUS (AV)  GORE-TEX GRAFT LEFT  FOREARM;  Surgeon: Rosetta Posner, MD;  Location: [place];  Service: Vascular;  Laterality: Left;  . AV FISTULA PLACEMENT Right 04/06/2019   Procedure: INSERTION OF ARTERIOVENOUS (AV) GORE-TEX GRAFT RIGHT UPPER ARM;  Surgeon: Waynetta Sandy, MD;  Location: [place];  Service: Vascular;  Laterality: Right;  . AV FISTULA PLACEMENT Right 04/13/2019   Procedure: INSERTION OF ARTERIOVENOUS (AV) BOVINE  ARTEGRAFT GRAFT RIGHT UPPER EXTREMITY;  Surgeon: Serafina Mitchell, MD;  Location: [place];  Service: Vascular;  Laterality: Right;  . BASCILIC VEIN TRANSPOSITION Right 03/07/2019   Procedure: First Stage Bascilic Vein Transposition Right Arm;  Surgeon: Serafina Mitchell, MD;  Location: [place];  Service: Vascular;  Laterality: Right;  . CATARACT EXTRACTION Right 2005  . INSERTION OF DIALYSIS CATHETER N/A 08/18/2018   Procedure: INSERTION OF DIALYSIS CATHETER;  Surgeon: Rosetta Posner, MD;  Location: [place];  Service: Vascular;  Laterality: N/A;  . IR FLUORO GUIDE CV LINE RIGHT  12/06/2018  . IR PTA ADDL CENTRAL DIALYSIS SEG THRU DIALY CIRCUIT RIGHT Right 12/06/2018  . THROMBECTOMY AND REVISION OF ARTERIOVENTOUS (AV) GORETEX  GRAFT Left 09/29/2018   Procedure: INSERTION OF ARTERIOVENTOUS (AV) GORETEX  GRAFT ARM;  Surgeon: Waynetta Sandy, MD;  Location: [place];  Service: Vascular;  Laterality: Left;  . UPPER EXTREMITY ANGIOGRAPHY Right 03/28/2019   Procedure: UPPER EXTREMITY ANGIOGRAPHY;  Surgeon: Waynetta Sandy, MD;  Location: [place] [facility];  Service: Cardiovascular;  Laterality: Right;  . VENOGRAM  10/27/2018   Procedure: Venogram;  Surgeon: Marty Heck, MD;  Location: [place] [facility];  Service: Cardiovascular;;  bilateral arm    Allergies: No Known Allergies  Medications:   Prior to Admission medications   Medication Sig Start Date End Date Taking? Authorizing Provider  amLODipine (NORVASC) 10 MG tablet Take 10 mg by mouth daily after lunch.    Yes  [provider]  aspirin EC 81 MG tablet Take 81 mg by mouth daily after lunch.    Yes [provider]  calcium carbonate (TUMS - DOSED IN MG ELEMENTAL CALCIUM) 500 MG chewable tablet Chew 1 tablet by mouth 2 (two) times daily. With lunch & supper   Yes [provider]  carvedilol (COREG) 6.25 MG tablet Take 6.25 mg by mouth 2 (two) times daily. 02/01/19  Yes [provider]  clopidogrel (PLAVIX) 75 MG tablet Take 1 tablet (75 mg total) by mouth daily. 04/13/19  Yes Serafina Mitchell, MD  fluticasone (FLONASE) 50 MCG/ACT nasal spray Place 1 spray into both nostrils daily as needed for allergies or rhinitis.    Yes [provider]  insulin NPH-regular Human (NOVOLIN 70/30) (70-30) 100 UNIT/ML injection Inject 12-20 Units into the skin See admin instructions. Use twice daily per sliding scale: Blood sugar less  than 150=12 units & Blood sugar 200 or more=20 units   Yes [provider]  isosorbide mononitrate (IMDUR) 30 MG 24 hr tablet Take 30 mg by mouth daily after lunch.    Yes [provider]  multivitamin (RENA-VIT) TABS tablet Take 1 tablet by mouth daily after lunch.  03/31/19  Yes [provider]  oxyCODONE-acetaminophen (PERCOCET/ROXICET) 5-325 MG tablet Take 1 tablet by mouth every 6 (six) hours as needed. Patient taking differently: Take 1 tablet by mouth every 6 (six) hours as needed (pain).  04/13/19  Yes Ulyses Amor, PA-C    Discontinued Meds:   Medications Discontinued During This Encounter  Medication Reason  . Surgifoam 1 Gm with Thrombin 20,000 units (20 ml) topical solution Patient Discharge  . lidocaine (XYLOCAINE) 1 % (with pres) injection Patient Discharge  . heparin 6,000 Units in sodium chloride 0.9 % 500 mL irrigation Patient Discharge  . 0.9 % irrigation (POUR BTL) Patient Discharge  . 0.9 %  sodium chloride infusion   . oxyCODONE (Oxy IR/ROXICODONE) immediate release tablet 5 mg Patient Transfer  .  oxyCODONE (ROXICODONE) 5 MG/5ML solution 5 mg Patient Transfer  . fentaNYL (SUBLIMAZE) injection 25-50 mcg Patient Transfer  . ondansetron (ZOFRAN) injection 4 mg Patient Transfer    Social History:  reports that she has never smoked. She has never used smokeless tobacco. She reports that she does not drink alcohol or use drugs.  Family History:   Family History  Problem Relation Age of Onset  . Heart murmur Mother   . Heart failure Mother   . Diabetes Mother   . Cirrhosis Father   . Alcoholism Father     Blood pressure (!) 117/57, pulse 79, temperature 97.6 F (36.4 C), temperature source Oral, resp. rate (!) 27, height 5\' 7"  (1.702 m), weight 71.2 kg, SpO2 97 %. General appearance: alert, cooperative and appears stated age Head: Normocephalic, without obvious abnormality, atraumatic Eyes: negative Neck: no adenopathy, no carotid bruit, no JVD, supple, symmetrical, trachea midline and thyroid not enlarged, symmetric, no tenderness/mass/nodules Back: symmetric, no curvature. ROM normal. No CVA tenderness. Resp: clear to auscultation bilaterally Chest wall: no tenderness, Rt chest wall IJ TC Cardio: S1, S2 normal GI: soft, non-tender; bowel sounds normal; no masses,  no organomegaly Extremities: extremities normal, atraumatic, no cyanosis or edema Pulses: 2+ and symmetric Skin: Skin color, texture, turgor normal. No rashes or lesions Lymph nodes: Cervical, supraclavicular, and axillary nodes normal. Neurologic: Grossly normal Access: RIJ TC, no bruit in RUA       Dewell Monnier, Hunt Oris, MD 05/16/2019, 3:12 PM

## 2019-05-16 NOTE — Op Note (Signed)
NAME: [name]    MRN: [id number] DOB: Jun 15, 1951    DATE OF OPERATION: 05/16/2019  PREOP DIAGNOSIS:    Infected right upper arm AV graft  POSTOP DIAGNOSIS:    Same  PROCEDURE:    Removal of infected right upper arm AV graft Vein patch angioplasty right brachial artery  SURGEON: Judeth Cornfield. Scot Dock, MD, FACS  ASSIST: Arlee Muslim, PA  ANESTHESIA: General  EBL: Minimal  INDICATIONS:    [name] is a 68 y.o. female who is had multiple previous failed access attempts.  Most recently she had a right upper arm Artegraft placed by Dr. Annamarie Major.  She presented with a pulsatile mass overlying the proximal anastomosis and some cellulitis to suggest infection.  She presents for removal of the graft and to address the pseudoaneurysm proximally.  FINDINGS:   The arterial anastomosis was disrupted.  This was repaired with a vein patch using vein from the axillary vein.  The entire Artegraft was removed.  TECHNIQUE:   The patient was taken to the operating room and initially started with local with sedation.  However ultimately she required general anesthesia.  The right arm was prepped and draped in usual sterile fashion.  Longitudinal incision was made beneath the axilla.  And here through scar tissue I was able to identify the axillary vein above the anastomosis.  The graft was identified at the area of the anastomosis this this was densely scarred here.  The graft was divided here.  Once I had gotten further proximal access to the high axillary vein this was clamped with a Satinsky clamp and then the vein removed.  The vein was oversewn with a running 5-0 Prolene suture.  I then harvested the vein from the venous anastomosis.  This was saved and heparinized saline.  Patient had been heparinized.  Tourniquet was placed on the upper arm.  The arm was exsanguinated with an Esmarch bandage.  The tourniquet was inflated to 250 mmHg.  Under tourniquet control I opened  the area of swelling and entered the pseudoaneurysm.  Large amount of clot was removed.  In the depths of the wound the arterial anastomosis could be identified.  The graft to come off the anastomosis completely.  Technically the artery was deep but I was able to expose it well enough that I could sew a vein patch.  Segment of vein was taken from the axillary vein.  The anastomosis was sewn with continuous 6-0 Prolene suture.  Of note I did pass a 3 Fogarty catheter in both directions prior to completing anastomosis and there was no clot retrieved.  The anastomosis was completed.  At that point there was a palpable radial pulse on the right.  Hemostasis was obtained in the wound and the patient heparin was partially reversed.  Of note the patient had been on Coumadin.  Made an incision midway up the arm and the graft here was identified.  Through this incision and the 2 other incisions I was able to remove the graft entirely.   Hemostasis was obtained in the wounds.  The incision in the mid upper arm was closed with interrupted 3 oh nylons.  The axillary incision was closed with a deep layer of 3-0 Vicryl and the skin closed with interrupted 3 oh nylons.  Incision where the pseudoaneurysm was was closed with a deep layer of 3-0 Vicryl.  There was a large amount of dead space.  The skin was closed with interrupted 3-0 nylons.  A  4 inch Ace was applied.  Patient tolerated the procedure well was transferred to the recovery room in stable condition.  All needle and sponge counts were correct correct.  Deitra Mayo, MD, FACS [facility]  DATE OF DICTATION:   05/16/2019

## 2019-05-17 ENCOUNTER — Encounter (HOSPITAL_COMMUNITY): Payer: Self-pay | Admitting: Vascular Surgery

## 2019-05-17 LAB — CBC
HCT: 23.3 % — ABNORMAL LOW (ref 36.0–46.0)
Hemoglobin: 7.1 g/dL — ABNORMAL LOW (ref 12.0–15.0)
MCH: 26.6 pg (ref 26.0–34.0)
MCHC: 30.5 g/dL (ref 30.0–36.0)
MCV: 87.3 fL (ref 80.0–100.0)
Platelets: 195 10*3/uL (ref 150–400)
RBC: 2.67 MIL/uL — ABNORMAL LOW (ref 3.87–5.11)
RDW: 15.4 % (ref 11.5–15.5)
WBC: 8.9 10*3/uL (ref 4.0–10.5)
nRBC: 0 % (ref 0.0–0.2)

## 2019-05-17 LAB — GLUCOSE, CAPILLARY
Glucose-Capillary: 138 mg/dL — ABNORMAL HIGH (ref 70–99)
Glucose-Capillary: 94 mg/dL (ref 70–99)

## 2019-05-17 LAB — PREPARE RBC (CROSSMATCH)

## 2019-05-17 LAB — BASIC METABOLIC PANEL
Anion gap: 12 (ref 5–15)
BUN: 17 mg/dL (ref 8–23)
CO2: 24 mmol/L (ref 22–32)
Calcium: 7.5 mg/dL — ABNORMAL LOW (ref 8.9–10.3)
Chloride: 102 mmol/L (ref 98–111)
Creatinine, Ser: 5.81 mg/dL — ABNORMAL HIGH (ref 0.44–1.00)
GFR calc Af Amer: 8 mL/min — ABNORMAL LOW (ref >60)
GFR calc non Af Amer: 7 mL/min — ABNORMAL LOW (ref >60)
Glucose, Bld: 146 mg/dL — ABNORMAL HIGH (ref 70–99)
Potassium: 3.2 mmol/L — ABNORMAL LOW (ref 3.5–5.1)
Sodium: 138 mmol/L (ref 135–145)

## 2019-05-17 LAB — ABO/RH: ABO/RH(D): A POS

## 2019-05-17 LAB — HEPATITIS B SURFACE ANTIGEN: Hepatitis B Surface Ag: NEGATIVE

## 2019-05-17 LAB — ACID FAST SMEAR (AFB, MYCOBACTERIA): Acid Fast Smear: NEGATIVE

## 2019-05-17 LAB — PHOSPHORUS: Phosphorus: 3.7 mg/dL (ref 2.5–4.6)

## 2019-05-17 MED ORDER — LIDOCAINE-PRILOCAINE 2.5-2.5 % EX CREA: 1.0000 "application " | TOPICAL_CREAM | CUTANEOUS | Status: DC | PRN

## 2019-05-17 MED ORDER — HEPARIN SODIUM (PORCINE) 1000 UNIT/ML DIALYSIS: 1000.0000 [IU] | INTRAMUSCULAR | Status: DC | PRN

## 2019-05-17 MED ORDER — OXYCODONE-ACETAMINOPHEN 5-325 MG PO TABS: 1.0000 | ORAL_TABLET | Freq: Four times a day (QID) | ORAL | 0 refills | Status: DC | PRN

## 2019-05-17 MED ORDER — SODIUM CHLORIDE 0.9% IV SOLUTION
Freq: Once | INTRAVENOUS | Status: AC
  Administered 2019-05-17: 12:00:00 via INTRAVENOUS

## 2019-05-17 MED ORDER — SODIUM CHLORIDE 0.9 % IV SOLN: 100.0000 mL | INTRAVENOUS | Status: DC | PRN

## 2019-05-17 MED ORDER — SODIUM CHLORIDE 0.9% IV SOLUTION: Freq: Once | INTRAVENOUS | Status: AC

## 2019-05-17 MED ORDER — LIDOCAINE HCL (PF) 1 % IJ SOLN: 5.0000 mL | INTRAMUSCULAR | Status: DC | PRN

## 2019-05-17 MED ORDER — PENTAFLUOROPROP-TETRAFLUOROETH EX AERO: 1.0000 "application " | INHALATION_SPRAY | CUTANEOUS | Status: DC | PRN

## 2019-05-17 MED ORDER — HEPARIN SODIUM (PORCINE) 1000 UNIT/ML IJ SOLN
INTRAMUSCULAR | Status: AC
  Filled 2019-05-17: qty 2

## 2019-05-17 MED ORDER — ALTEPLASE 2 MG IJ SOLR: 2.0000 mg | Freq: Once | INTRAMUSCULAR | Status: DC | PRN

## 2019-05-17 MED ORDER — SULFAMETHOXAZOLE-TRIMETHOPRIM 400-80 MG PO TABS: 1.0000 | ORAL_TABLET | Freq: Every day | ORAL | 0 refills | Status: DC

## 2019-05-17 MED FILL — Thrombin (Recombinant) For Soln 20000 Unit: CUTANEOUS | Qty: 1 | Status: AC

## 2019-05-17 NOTE — Anesthesia Postprocedure Evaluation (Signed)
Anesthesia Post Note  Patient: Personnel officer  Procedure(s) Performed: REMOVAL OF ARTERIOVENOUS GORETEX GRAFT ([place]) RIGHT ARM (Right Arm Upper)     Patient location during evaluation: PACU Anesthesia Type: General Level of consciousness: awake and alert Pain management: pain level controlled Vital Signs Assessment: post-procedure vital signs reviewed and stable Respiratory status: spontaneous breathing, nonlabored ventilation and respiratory function stable Cardiovascular status: blood pressure returned to baseline and stable Postop Assessment: no apparent nausea or vomiting Anesthetic complications: no    Last Vitals:  Vitals:   05/17/19 1200 05/17/19 1238  BP: (!) 113/50 (!) 123/45  Pulse: 93 82  Resp: (!) 21 (!) 29  Temp: 37.3 C 37.3 C  SpO2: 96% 98%    Last Pain:  Vitals:   05/17/19 1238  TempSrc: Oral  PainSc:                  Lidia Collum

## 2019-05-17 NOTE — Progress Notes (Signed)
   VASCULAR SURGERY ASSESSMENT & PLAN:   POD 1: S/P REMOVAL OF RIGHT UPPER ARM INFECTED ARTEGRAFT: I removed her Ace bandage as this was on very tight.  She is currently on dialysis since I did not want to change her dressings which have a small amount of drainage on it.  She has a palpable right radial pulse.  She has moderate swelling in the right upper arm and we will need to keep an eye on that.   SUBJECTIVE:   Just got on dialysis.  PHYSICAL EXAM:   Vitals:   05/17/19 0659 05/17/19 0700 05/17/19 0730 05/17/19 0800  BP: (!) 145/62 (!) 138/59 (!) 118/54 (!) 118/55  Pulse: 84 84 84 82  Resp:      Temp:      TempSrc:      SpO2:      Weight:      Height:       Palpable right radial pulse. Moderate swelling right upper arm. She has a small amount of drainage on her dressings but these will have to be changed later as she is currently on dialysis.  LABS:   Lab Results  Component Value Date   WBC 8.9 05/17/2019   HGB 7.1 (L) 05/17/2019   HCT 23.3 (L) 05/17/2019   MCV 87.3 05/17/2019   PLT 195 05/17/2019   Lab Results  Component Value Date   CREATININE 5.81 (H) 05/17/2019   Lab Results  Component Value Date   INR 1.3 (H) 05/16/2019   CBG (last 3)  Recent Labs    05/16/19 1606 05/16/19 2138 05/17/19 0617  GLUCAP 108* 166* 138*    PROBLEM LIST:    Active Problems:   ESRD on dialysis ([place])   CURRENT MEDS:   . amLODipine  10 mg Oral QPC lunch  . aspirin EC  81 mg Oral QPC lunch  . calcium carbonate  1 tablet Oral BID  . carvedilol  6.25 mg Oral BID  . Chlorhexidine Gluconate Cloth  6 each Topical Q0600  . insulin aspart  0-15 Units Subcutaneous TID WC  . isosorbide mononitrate  30 mg Oral QPC lunch  . pantoprazole  40 mg Oral Daily    Deitra Mayo Beeper: [phone] Office: [phone] 05/17/2019

## 2019-05-17 NOTE — Progress Notes (Signed)
[facility] Progress Note  Name: [name] MRN: [id number] DOB: 10/10/1951  Chief Complaint:  Had graft resected   Subjective:  Seen on HD with BP 134/55 and HR 78.  Tolerating UF.  States that she will be going home later today.   Review of systems:  Denies shortness of breath or chest pain Denies n/v  Background;  HPI: [name] is an 68 y.o. female 45mm RUA ARtegraft placed on 04/13/2019 after numerous failed accesses. She had a pusatile mass overlying the arterial anastomosis with associated pseudoaneurysm and overlying cellulitis. She was taken to the OR by Dr. Scot Dock who was able to repair the disrupted arterial anastomosis with a harvested axillary vein patch + removal of the Artegraft in its entirety. Pt has a medical history significant for DM HTN Afib CVA and ESRD currently dialyzing MWF w/ Dr. Carson Myrtle at [facility]. Her last treatment was on Monday and she reports no issues on dialysis. She denies dyspnea, CP, fever, chills, sore throat.    Intake/Output Summary (Last 24 hours) at 05/17/2019 0903 Last data filed at 05/16/2019 1327 Gross per 24 hour  Intake 500 ml  Output 150 ml  Net 350 ml    Vitals:  Vitals:   05/17/19 0700 05/17/19 0730 05/17/19 0800 05/17/19 0830  BP: (!) 138/59 (!) 118/54 (!) 118/55 (!) 108/44  Pulse: 84 84 82 83  Resp:      Temp:      TempSrc:      SpO2:      Weight:      Height:         Physical Exam:  General adult female in bed in no acute distress HEENT normocephalic atraumatic extraocular movements intact sclera anicteric Neck supple trachea midline Lungs clear to auscultation bilaterally normal work of breathing at rest  Heart regular rate and rhythm no rubs or gallops appreciated Abdomen soft nontender nondistended Extremities no edema; RUE bandaged  Psych normal mood and affect Access right chest tunneled catheter   Medications reviewed   Labs:  BMP Latest Ref Rng & Units 05/17/2019 05/16/2019  04/13/2019  Glucose 70 - 99 mg/dL 146(H) 146(H) 99  BUN 8 - 23 mg/dL 17 - -  Creatinine 0.44 - 1.00 mg/dL 5.81(H) - -  Sodium 135 - 145 mmol/L 138 139 136  Potassium 3.5 - 5.1 mmol/L 3.2(L) 2.8(L) 3.9  Chloride 98 - 111 mmol/L 102 - -  CO2 22 - 32 mmol/L 24 - -  Calcium 8.9 - 10.3 mg/dL 7.5(L) - -     Assessment/Plan:   1. ESRD - HD today per MWF regimen.  High K bath.  2. Renal osteodystrophy - currently on TUMS 1 tab BID (lunch and dinner); renal profile in AM if she remains  3. HTN - well controlled  4. CASHD 5. DM - per primary team  6. Disrupted Artegraft w/ possible infection removed in its entirety by Dr. Scot Dock on 05/16/2019. 7. Anemia of CKD with acute post-op blood loss - would transfuse 1 unit PRBC's prior to discharge today     Claudia Desanctis, MD 05/17/2019 9:03 AM

## 2019-05-17 NOTE — Progress Notes (Signed)
Dressing changed after dialysis.  Incsions intact with minimal drainage  Plan for d/c today with 7 d abx  [facility]

## 2019-05-17 NOTE — Progress Notes (Signed)
Renal Navigator received notification from [facility] clinic that patient is a Therapist, music patient, not a [place] patient.  [name],  Renal Navigator [phone]

## 2019-05-17 NOTE — Discharge Summary (Signed)
Discharge Summary    [name] [birth year] 68 y.o. female  [id number]  Admission Date: 05/16/2019  Discharge Date: 05/17/2019  Physician: Serafina Mitchell, MD  Admission Diagnosis: INFECTED RIGHT ARM GRAFT   HPI:   This is a 68 y.o. female with end-stage renal disease.  She has had multiple access procedures in her arms which have occluded prematurely.  She underwent a venogram that showed the venous system on the right to be widely patent.  Arterial evaluation was also normal.  Most recently on 04/13/2019 a 6 mm right upper extremity Artegraft was placed.  She was also started on Plavix.  Hospital Course:  The patient was admitted to the hospital and taken to the operating room on 05/16/2019 and underwent: Removal of infected right upper arm AV graft Vein patch angioplasty right brachial artery    Findings: The arterial anastomosis was disrupted.  This was repaired with a vein patch using vein from the axillary vein.  The entire Artegraft was removed.  The pt tolerated the procedure well and was transported to the PACU in good condition.   By POD 1, pt doing well.  Dressing changed and incisions look good. ABD and ace wrap applied to arm.  Pt received dialysis and one unit PRBC's prior to discharge.    The remainder of the hospital course consisted of increasing mobilization and increasing intake of solids without difficulty.  CBC    Component Value Date/Time   WBC 8.9 05/17/2019 0440   RBC 2.67 (L) 05/17/2019 0440   HGB 7.1 (L) 05/17/2019 0440   HCT 23.3 (L) 05/17/2019 0440   PLT 195 05/17/2019 0440   MCV 87.3 05/17/2019 0440   MCH 26.6 05/17/2019 0440   MCHC 30.5 05/17/2019 0440   RDW 15.4 05/17/2019 0440    BMET    Component Value Date/Time   NA 138 05/17/2019 0440   K 3.2 (L) 05/17/2019 0440   CL 102 05/17/2019 0440   CO2 24 05/17/2019 0440   GLUCOSE 146 (H) 05/17/2019 0440   BUN 17 05/17/2019 0440   CREATININE 5.81 (H) 05/17/2019 0440   CALCIUM  7.5 (L) 05/17/2019 0440   GFRNONAA 7 (L) 05/17/2019 0440   GFRAA 8 (L) 05/17/2019 0440      Discharge Instructions    Discharge patient   Complete by: As directed    Discharge home after pt receives prbc's.   Discharge disposition: 01-Home or Self Care   Discharge patient date: 05/17/2019      Discharge Diagnosis:  INFECTED RIGHT ARM GRAFT  Secondary Diagnosis: Patient Active Problem List   Diagnosis Date Noted  . ESRD on dialysis [facility]) 05/15/2019   Past Medical History:  Diagnosis Date  . Arthritis    hands  . Constipation   . Diabetes mellitus without complication (HCC)    Type II  . ESRD (end stage renal disease) ([place])     M/W/F- Hemodialysis  . GERD (gastroesophageal reflux disease)   . Headache   . Hyperlipidemia   . Hypertension   . Irregular heart rate   . Stroke [facility])    TIA - approx 2010- no residual     Allergies as of 05/17/2019   No Known Allergies     Medication List    TAKE these medications   amLODipine 10 MG tablet Commonly known as: NORVASC Take 10 mg by mouth daily after lunch.   aspirin EC 81 MG tablet Take 81 mg by mouth daily after lunch.   calcium carbonate 500  MG chewable tablet Commonly known as: TUMS - dosed in mg elemental calcium Chew 1 tablet by mouth 2 (two) times daily. With lunch & supper   carvedilol 6.25 MG tablet Commonly known as: COREG Take 6.25 mg by mouth 2 (two) times daily.   clopidogrel 75 MG tablet Commonly known as: Plavix Take 1 tablet (75 mg total) by mouth daily.   fluticasone 50 MCG/ACT nasal spray Commonly known as: FLONASE Place 1 spray into both nostrils daily as needed for allergies or rhinitis.   insulin NPH-regular Human (70-30) 100 UNIT/ML injection Inject 12-20 Units into the skin See admin instructions. Use twice daily per sliding scale: Blood sugar less than 150=12 units & Blood sugar 200 or more=20 units   isosorbide mononitrate 30 MG 24 hr tablet Commonly known as: IMDUR Take 30 mg  by mouth daily after lunch.   multivitamin Tabs tablet Take 1 tablet by mouth daily after lunch.   oxyCODONE-acetaminophen 5-325 MG tablet Commonly known as: PERCOCET/ROXICET Take 1 tablet by mouth every 6 (six) hours as needed (pain).   sulfamethoxazole-trimethoprim 400-80 MG tablet Commonly known as: Bactrim Take 1 tablet by mouth daily. On dialysis days, take after your dialysis treatment.       Prescriptions given: 1.  Roxicet #20 No Refill 2.  Bactrim 400/80 one daily x 2 weeks (on dialysis days, take after HD) #14 NR  Instructions:   [facility]  Discharge Instructions  AV Fistula or Graft Surgery for Dialysis Access  Please refer to the following instructions for your post-procedure care. Your surgeon or physician assistant will discuss any changes with you.  Activity  You may drive the day following your surgery, if you are comfortable and no longer taking prescription pain medication. Resume full activity as the soreness in your incision resolves.  Bathing/Showering  You may shower after you go home. Keep your incision dry for 48 hours. Do not soak in a bathtub, hot tub, or swim until the incision heals completely. You may not shower if you have a hemodialysis catheter.  Incision Care  Clean your incision with mild soap and water after 48 hours. Pat the area dry with a clean towel. You do not need a bandage unless otherwise instructed. Do not apply any ointments or creams to your incision. You may have skin glue on your incision. Do not peel it off. It will come off on its own in about one week. Your arm may swell a bit after surgery. To reduce swelling use pillows to elevate your arm so it is above your heart. Your doctor will tell you if you need to lightly wrap your arm with an ACE bandage.  Diet  Resume your normal diet. There are not special food restrictions following this procedure. In order to heal from your surgery, it is  CRITICAL to get adequate nutrition. Your body requires vitamins, minerals, and protein. Vegetables are the best source of vitamins and minerals. Vegetables also provide the perfect balance of protein. Processed food has little nutritional value, so try to avoid this.  Medications  Resume taking all of your medications. If your incision is causing pain, you may take over-the counter pain relievers such as acetaminophen (Tylenol). If you were prescribed a stronger pain medication, please be aware these medications can cause nausea and constipation. Prevent nausea by taking the medication with a snack or meal. Avoid constipation by drinking plenty of fluids and eating foods with high amount of fiber, such as fruits,  vegetables, and grains.  Do not take Tylenol if you are taking prescription pain medications.  Follow up Your surgeon may want to see you in the office following your access surgery. If so, this will be arranged at the time of your surgery.  Please call us immediately for any of the following conditions:  Increased pain, redness, drainage (pus) from your incision site Fever of 101 degrees or higher Severe or worsening pain at your incision site Hand pain or numbness.  Reduce your risk of vascular disease:  Stop smoking. If you would like help, call [facility] at 1-800-QUIT-NOW 346-370-9069) or [place] at [place] your cholesterol Maintain a desired weight Control your diabetes Keep your blood pressure down  Dialysis  It will take several weeks to several months for your new dialysis access to be ready for use. Your surgeon will determine when it is okay to use it. Your nephrologist will continue to direct your dialysis. You can continue to use your Permcath until your new access is ready for use.   05/17/2019 [name] [id number] [birth year]  Surgeon(s): Angelia Mould, MD  Procedure(s): REMOVAL OF ARTERIOVENOUS GORETEX GRAFT ([place])  RIGHT ARM   If you have any questions, please call the office at [phone].   Disposition: home  Patient's condition: is Good  Follow up: 1. Dr. Trula Slade in 2 weeks   Leontine Locket, PA-C [facility] [phone] 05/17/2019  1:25 PM

## 2019-05-18 LAB — TYPE AND SCREEN
ABO/RH(D): A POS
Antibody Screen: NEGATIVE
Unit division: 0

## 2019-05-18 LAB — BPAM RBC
Blood Product Expiration Date: 202007122359
ISSUE DATE / TIME: 202006241209
Unit Type and Rh: 6200

## 2019-05-19 DIAGNOSIS — Z992 Dependence on renal dialysis: Secondary | ICD-10-CM | POA: Diagnosis not present

## 2019-05-19 DIAGNOSIS — N186 End stage renal disease: Secondary | ICD-10-CM | POA: Diagnosis not present

## 2019-05-19 DIAGNOSIS — Z298 Encounter for other specified prophylactic measures: Secondary | ICD-10-CM | POA: Diagnosis not present

## 2019-05-21 LAB — AEROBIC/ANAEROBIC CULTURE W GRAM STAIN (SURGICAL/DEEP WOUND)

## 2019-05-22 DIAGNOSIS — Z992 Dependence on renal dialysis: Secondary | ICD-10-CM | POA: Diagnosis not present

## 2019-05-22 DIAGNOSIS — N186 End stage renal disease: Secondary | ICD-10-CM | POA: Diagnosis not present

## 2019-05-22 DIAGNOSIS — Z298 Encounter for other specified prophylactic measures: Secondary | ICD-10-CM | POA: Diagnosis not present

## 2019-05-23 DIAGNOSIS — N186 End stage renal disease: Secondary | ICD-10-CM | POA: Diagnosis not present

## 2019-05-23 DIAGNOSIS — Z992 Dependence on renal dialysis: Secondary | ICD-10-CM | POA: Diagnosis not present

## 2019-05-24 DIAGNOSIS — Z992 Dependence on renal dialysis: Secondary | ICD-10-CM | POA: Diagnosis not present

## 2019-05-24 DIAGNOSIS — N186 End stage renal disease: Secondary | ICD-10-CM | POA: Diagnosis not present

## 2019-05-26 DIAGNOSIS — N186 End stage renal disease: Secondary | ICD-10-CM | POA: Diagnosis not present

## 2019-05-26 DIAGNOSIS — Z992 Dependence on renal dialysis: Secondary | ICD-10-CM | POA: Diagnosis not present

## 2019-05-29 DIAGNOSIS — N186 End stage renal disease: Secondary | ICD-10-CM | POA: Diagnosis not present

## 2019-05-29 DIAGNOSIS — Z992 Dependence on renal dialysis: Secondary | ICD-10-CM | POA: Diagnosis not present

## 2019-05-31 DIAGNOSIS — N186 End stage renal disease: Secondary | ICD-10-CM | POA: Diagnosis not present

## 2019-05-31 DIAGNOSIS — Z992 Dependence on renal dialysis: Secondary | ICD-10-CM | POA: Diagnosis not present

## 2019-06-02 DIAGNOSIS — N186 End stage renal disease: Secondary | ICD-10-CM | POA: Diagnosis not present

## 2019-06-02 DIAGNOSIS — Z992 Dependence on renal dialysis: Secondary | ICD-10-CM | POA: Diagnosis not present

## 2019-06-05 DIAGNOSIS — Z992 Dependence on renal dialysis: Secondary | ICD-10-CM | POA: Diagnosis not present

## 2019-06-05 DIAGNOSIS — N186 End stage renal disease: Secondary | ICD-10-CM | POA: Diagnosis not present

## 2019-06-07 DIAGNOSIS — Z992 Dependence on renal dialysis: Secondary | ICD-10-CM | POA: Diagnosis not present

## 2019-06-07 DIAGNOSIS — N186 End stage renal disease: Secondary | ICD-10-CM | POA: Diagnosis not present

## 2019-06-09 DIAGNOSIS — Z992 Dependence on renal dialysis: Secondary | ICD-10-CM | POA: Diagnosis not present

## 2019-06-09 DIAGNOSIS — N186 End stage renal disease: Secondary | ICD-10-CM | POA: Diagnosis not present

## 2019-06-12 DIAGNOSIS — Z992 Dependence on renal dialysis: Secondary | ICD-10-CM | POA: Diagnosis not present

## 2019-06-12 DIAGNOSIS — N186 End stage renal disease: Secondary | ICD-10-CM | POA: Diagnosis not present

## 2019-06-14 ENCOUNTER — Encounter: Payer: Self-pay | Admitting: Vascular Surgery

## 2019-06-14 ENCOUNTER — Encounter: Payer: Self-pay | Admitting: *Deleted

## 2019-06-14 ENCOUNTER — Other Ambulatory Visit: Payer: Self-pay

## 2019-06-14 ENCOUNTER — Ambulatory Visit (INDEPENDENT_AMBULATORY_CARE_PROVIDER_SITE_OTHER): Payer: Self-pay | Admitting: Vascular Surgery

## 2019-06-14 VITALS — BP 177/107 | HR 110 | Temp 97.8°F | Resp 20 | Ht 67.0 in | Wt 147.1 lb

## 2019-06-14 DIAGNOSIS — Z48812 Encounter for surgical aftercare following surgery on the circulatory system: Secondary | ICD-10-CM

## 2019-06-14 DIAGNOSIS — N186 End stage renal disease: Secondary | ICD-10-CM | POA: Diagnosis not present

## 2019-06-14 DIAGNOSIS — Z992 Dependence on renal dialysis: Secondary | ICD-10-CM | POA: Diagnosis not present
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
## 2019-06-16 DIAGNOSIS — Z992 Dependence on renal dialysis: Secondary | ICD-10-CM | POA: Diagnosis not present

## 2019-06-16 DIAGNOSIS — N186 End stage renal disease: Secondary | ICD-10-CM | POA: Diagnosis not present

## 2019-06-19 DIAGNOSIS — Z992 Dependence on renal dialysis: Secondary | ICD-10-CM | POA: Diagnosis not present

## 2019-06-19 DIAGNOSIS — N186 End stage renal disease: Secondary | ICD-10-CM | POA: Diagnosis not present

## 2019-06-21 DIAGNOSIS — Z992 Dependence on renal dialysis: Secondary | ICD-10-CM | POA: Diagnosis not present

## 2019-06-21 DIAGNOSIS — N186 End stage renal disease: Secondary | ICD-10-CM | POA: Diagnosis not present

## 2019-06-23 DIAGNOSIS — N186 End stage renal disease: Secondary | ICD-10-CM | POA: Diagnosis not present

## 2019-06-23 DIAGNOSIS — Z992 Dependence on renal dialysis: Secondary | ICD-10-CM | POA: Diagnosis not present

## 2019-06-26 DIAGNOSIS — Z992 Dependence on renal dialysis: Secondary | ICD-10-CM | POA: Diagnosis not present

## 2019-06-26 DIAGNOSIS — N186 End stage renal disease: Secondary | ICD-10-CM | POA: Diagnosis not present

## 2019-06-28 DIAGNOSIS — N186 End stage renal disease: Secondary | ICD-10-CM | POA: Diagnosis not present

## 2019-06-28 DIAGNOSIS — Z992 Dependence on renal dialysis: Secondary | ICD-10-CM | POA: Diagnosis not present

## 2019-06-28 LAB — ACID FAST CULTURE WITH REFLEXED SENSITIVITIES (MYCOBACTERIA): Acid Fast Culture: NEGATIVE

## 2019-06-30 DIAGNOSIS — Z992 Dependence on renal dialysis: Secondary | ICD-10-CM | POA: Diagnosis not present

## 2019-06-30 DIAGNOSIS — N186 End stage renal disease: Secondary | ICD-10-CM | POA: Diagnosis not present

## 2019-07-03 DIAGNOSIS — N186 End stage renal disease: Secondary | ICD-10-CM | POA: Diagnosis not present

## 2019-07-03 DIAGNOSIS — Z992 Dependence on renal dialysis: Secondary | ICD-10-CM | POA: Diagnosis not present

## 2019-07-05 DIAGNOSIS — Z992 Dependence on renal dialysis: Secondary | ICD-10-CM | POA: Diagnosis not present

## 2019-07-05 DIAGNOSIS — N186 End stage renal disease: Secondary | ICD-10-CM | POA: Diagnosis not present

## 2019-07-07 DIAGNOSIS — Z992 Dependence on renal dialysis: Secondary | ICD-10-CM | POA: Diagnosis not present

## 2019-07-07 DIAGNOSIS — N186 End stage renal disease: Secondary | ICD-10-CM | POA: Diagnosis not present

## 2019-07-09 DIAGNOSIS — E785 Hyperlipidemia, unspecified: Secondary | ICD-10-CM | POA: Diagnosis not present

## 2019-07-09 DIAGNOSIS — R000 Tachycardia, unspecified: Secondary | ICD-10-CM | POA: Diagnosis not present

## 2019-07-09 DIAGNOSIS — E1140 Type 2 diabetes mellitus with diabetic neuropathy, unspecified: Secondary | ICD-10-CM | POA: Diagnosis not present

## 2019-07-09 DIAGNOSIS — I10 Essential (primary) hypertension: Secondary | ICD-10-CM | POA: Diagnosis not present

## 2019-07-09 DIAGNOSIS — K219 Gastro-esophageal reflux disease without esophagitis: Secondary | ICD-10-CM | POA: Diagnosis not present

## 2019-07-09 DIAGNOSIS — M5441 Lumbago with sciatica, right side: Secondary | ICD-10-CM | POA: Diagnosis not present

## 2019-07-09 DIAGNOSIS — Z794 Long term (current) use of insulin: Secondary | ICD-10-CM | POA: Diagnosis not present

## 2019-07-09 DIAGNOSIS — K5900 Constipation, unspecified: Secondary | ICD-10-CM | POA: Diagnosis not present

## 2019-07-09 DIAGNOSIS — Z992 Dependence on renal dialysis: Secondary | ICD-10-CM | POA: Diagnosis not present

## 2019-07-09 DIAGNOSIS — M545 Low back pain: Secondary | ICD-10-CM | POA: Diagnosis not present

## 2019-07-10 DIAGNOSIS — N186 End stage renal disease: Secondary | ICD-10-CM | POA: Diagnosis not present

## 2019-07-10 DIAGNOSIS — Z992 Dependence on renal dialysis: Secondary | ICD-10-CM | POA: Diagnosis not present

## 2019-07-12 DIAGNOSIS — Z992 Dependence on renal dialysis: Secondary | ICD-10-CM | POA: Diagnosis not present

## 2019-07-12 DIAGNOSIS — N186 End stage renal disease: Secondary | ICD-10-CM | POA: Diagnosis not present

## 2019-07-13 DIAGNOSIS — M545 Low back pain: Secondary | ICD-10-CM | POA: Diagnosis not present

## 2019-07-13 DIAGNOSIS — Z6823 Body mass index (BMI) 23.0-23.9, adult: Secondary | ICD-10-CM | POA: Diagnosis not present

## 2019-07-14 ENCOUNTER — Other Ambulatory Visit: Payer: Self-pay

## 2019-07-14 DIAGNOSIS — N186 End stage renal disease: Secondary | ICD-10-CM

## 2019-07-14 DIAGNOSIS — Z992 Dependence on renal dialysis: Secondary | ICD-10-CM

## 2019-07-14 DIAGNOSIS — I739 Peripheral vascular disease, unspecified: Secondary | ICD-10-CM

## 2019-07-19 ENCOUNTER — Emergency Department (HOSPITAL_COMMUNITY)
Admission: EM | Admit: 2019-07-19 | Discharge: 2019-07-19 | Disposition: A | Discharge status: Home / Self Care | Payer: Medicare PPO | Source: Home / Self Care | Attending: Emergency Medicine | Admitting: Emergency Medicine

## 2019-07-19 ENCOUNTER — Other Ambulatory Visit: Payer: Self-pay

## 2019-07-19 ENCOUNTER — Emergency Department (HOSPITAL_COMMUNITY): Payer: Medicare PPO

## 2019-07-19 ENCOUNTER — Encounter (HOSPITAL_COMMUNITY): Payer: Self-pay

## 2019-07-19 ENCOUNTER — Encounter (HOSPITAL_COMMUNITY): Payer: Medicare PPO

## 2019-07-19 ENCOUNTER — Ambulatory Visit: Payer: Medicare PPO | Admitting: Vascular Surgery

## 2019-07-19 DIAGNOSIS — R0602 Shortness of breath: Secondary | ICD-10-CM | POA: Diagnosis not present

## 2019-07-19 DIAGNOSIS — R443 Hallucinations, unspecified: Secondary | ICD-10-CM | POA: Diagnosis present

## 2019-07-19 DIAGNOSIS — Z79899 Other long term (current) drug therapy: Secondary | ICD-10-CM | POA: Insufficient documentation

## 2019-07-19 DIAGNOSIS — Z20828 Contact with and (suspected) exposure to other viral communicable diseases: Secondary | ICD-10-CM | POA: Diagnosis present

## 2019-07-19 DIAGNOSIS — M4640 Discitis, unspecified, site unspecified: Secondary | ICD-10-CM | POA: Diagnosis present

## 2019-07-19 DIAGNOSIS — Z452 Encounter for adjustment and management of vascular access device: Secondary | ICD-10-CM | POA: Diagnosis not present

## 2019-07-19 DIAGNOSIS — B9689 Other specified bacterial agents as the cause of diseases classified elsewhere: Secondary | ICD-10-CM | POA: Diagnosis present

## 2019-07-19 DIAGNOSIS — Z794 Long term (current) use of insulin: Secondary | ICD-10-CM | POA: Diagnosis not present

## 2019-07-19 DIAGNOSIS — E785 Hyperlipidemia, unspecified: Secondary | ICD-10-CM | POA: Diagnosis present

## 2019-07-19 DIAGNOSIS — G9341 Metabolic encephalopathy: Secondary | ICD-10-CM | POA: Diagnosis present

## 2019-07-19 DIAGNOSIS — R404 Transient alteration of awareness: Secondary | ICD-10-CM

## 2019-07-19 DIAGNOSIS — E1121 Type 2 diabetes mellitus with diabetic nephropathy: Secondary | ICD-10-CM | POA: Diagnosis not present

## 2019-07-19 DIAGNOSIS — K449 Diaphragmatic hernia without obstruction or gangrene: Secondary | ICD-10-CM | POA: Insufficient documentation

## 2019-07-19 DIAGNOSIS — K219 Gastro-esophageal reflux disease without esophagitis: Secondary | ICD-10-CM | POA: Diagnosis present

## 2019-07-19 DIAGNOSIS — Z23 Encounter for immunization: Secondary | ICD-10-CM | POA: Diagnosis present

## 2019-07-19 DIAGNOSIS — R7881 Bacteremia: Secondary | ICD-10-CM | POA: Diagnosis present

## 2019-07-19 DIAGNOSIS — M1990 Unspecified osteoarthritis, unspecified site: Secondary | ICD-10-CM | POA: Diagnosis present

## 2019-07-19 DIAGNOSIS — M4626 Osteomyelitis of vertebra, lumbar region: Secondary | ICD-10-CM | POA: Diagnosis present

## 2019-07-19 DIAGNOSIS — E1122 Type 2 diabetes mellitus with diabetic chronic kidney disease: Secondary | ICD-10-CM | POA: Diagnosis present

## 2019-07-19 DIAGNOSIS — M48061 Spinal stenosis, lumbar region without neurogenic claudication: Secondary | ICD-10-CM | POA: Diagnosis not present

## 2019-07-19 DIAGNOSIS — F0390 Unspecified dementia without behavioral disturbance: Secondary | ICD-10-CM | POA: Diagnosis present

## 2019-07-19 DIAGNOSIS — D631 Anemia in chronic kidney disease: Secondary | ICD-10-CM | POA: Diagnosis not present

## 2019-07-19 DIAGNOSIS — I10 Essential (primary) hypertension: Secondary | ICD-10-CM | POA: Diagnosis not present

## 2019-07-19 DIAGNOSIS — R6520 Severe sepsis without septic shock: Secondary | ICD-10-CM | POA: Diagnosis present

## 2019-07-19 DIAGNOSIS — I313 Pericardial effusion (noninflammatory): Secondary | ICD-10-CM | POA: Diagnosis not present

## 2019-07-19 DIAGNOSIS — K5900 Constipation, unspecified: Secondary | ICD-10-CM | POA: Diagnosis present

## 2019-07-19 DIAGNOSIS — I120 Hypertensive chronic kidney disease with stage 5 chronic kidney disease or end stage renal disease: Secondary | ICD-10-CM | POA: Diagnosis present

## 2019-07-19 DIAGNOSIS — S3991XA Unspecified injury of abdomen, initial encounter: Secondary | ICD-10-CM | POA: Diagnosis not present

## 2019-07-19 DIAGNOSIS — R4182 Altered mental status, unspecified: Secondary | ICD-10-CM | POA: Diagnosis not present

## 2019-07-19 DIAGNOSIS — Z992 Dependence on renal dialysis: Secondary | ICD-10-CM | POA: Diagnosis not present

## 2019-07-19 DIAGNOSIS — J9811 Atelectasis: Secondary | ICD-10-CM | POA: Diagnosis not present

## 2019-07-19 DIAGNOSIS — A4152 Sepsis due to Pseudomonas: Secondary | ICD-10-CM | POA: Diagnosis present

## 2019-07-19 DIAGNOSIS — R000 Tachycardia, unspecified: Secondary | ICD-10-CM | POA: Diagnosis not present

## 2019-07-19 DIAGNOSIS — G9340 Encephalopathy, unspecified: Secondary | ICD-10-CM | POA: Diagnosis present

## 2019-07-19 DIAGNOSIS — R109 Unspecified abdominal pain: Secondary | ICD-10-CM | POA: Diagnosis not present

## 2019-07-19 DIAGNOSIS — M5441 Lumbago with sciatica, right side: Secondary | ICD-10-CM | POA: Diagnosis not present

## 2019-07-19 DIAGNOSIS — A419 Sepsis, unspecified organism: Secondary | ICD-10-CM | POA: Diagnosis not present

## 2019-07-19 DIAGNOSIS — Z8673 Personal history of transient ischemic attack (TIA), and cerebral infarction without residual deficits: Secondary | ICD-10-CM | POA: Diagnosis not present

## 2019-07-19 DIAGNOSIS — D649 Anemia, unspecified: Secondary | ICD-10-CM | POA: Diagnosis present

## 2019-07-19 DIAGNOSIS — E875 Hyperkalemia: Secondary | ICD-10-CM | POA: Diagnosis present

## 2019-07-19 DIAGNOSIS — E1169 Type 2 diabetes mellitus with other specified complication: Secondary | ICD-10-CM | POA: Diagnosis present

## 2019-07-19 DIAGNOSIS — N250 Renal osteodystrophy: Secondary | ICD-10-CM | POA: Diagnosis present

## 2019-07-19 DIAGNOSIS — N186 End stage renal disease: Secondary | ICD-10-CM | POA: Diagnosis present

## 2019-07-19 LAB — COMPREHENSIVE METABOLIC PANEL
ALT: 20 U/L (ref 0–44)
AST: 31 U/L (ref 15–41)
Albumin: 2.9 g/dL — ABNORMAL LOW (ref 3.5–5.0)
Alkaline Phosphatase: 104 U/L (ref 38–126)
Anion gap: 11 (ref 5–15)
BUN: 28 mg/dL — ABNORMAL HIGH (ref 8–23)
CO2: 25 mmol/L (ref 22–32)
Calcium: 8.1 mg/dL — ABNORMAL LOW (ref 8.9–10.3)
Chloride: 100 mmol/L (ref 98–111)
Creatinine, Ser: 4.44 mg/dL — ABNORMAL HIGH (ref 0.44–1.00)
GFR calc Af Amer: 11 mL/min — ABNORMAL LOW (ref >60)
GFR calc non Af Amer: 10 mL/min — ABNORMAL LOW (ref >60)
Glucose, Bld: 145 mg/dL — ABNORMAL HIGH (ref 70–99)
Potassium: 4.3 mmol/L (ref 3.5–5.1)
Sodium: 136 mmol/L (ref 135–145)
Total Bilirubin: 0.6 mg/dL (ref 0.3–1.2)
Total Protein: 7.4 g/dL (ref 6.5–8.1)

## 2019-07-19 LAB — PROTIME-INR
INR: 1.1 (ref 0.8–1.2)
Prothrombin Time: 13.6 seconds (ref 11.4–15.2)

## 2019-07-19 LAB — URINALYSIS, ROUTINE W REFLEX MICROSCOPIC
Bilirubin Urine: NEGATIVE
Glucose, UA: NEGATIVE mg/dL
Ketones, ur: NEGATIVE mg/dL
Leukocytes,Ua: NEGATIVE
Nitrite: NEGATIVE
Protein, ur: 100 mg/dL — AB
Specific Gravity, Urine: 1.027 (ref 1.005–1.030)
pH: 5 (ref 5.0–8.0)

## 2019-07-19 LAB — CBC WITH DIFFERENTIAL/PLATELET
Abs Immature Granulocytes: 0.27 10*3/uL — ABNORMAL HIGH (ref 0.00–0.07)
Basophils Absolute: 0.1 10*3/uL (ref 0.0–0.1)
Basophils Relative: 0 %
Eosinophils Absolute: 0.1 10*3/uL (ref 0.0–0.5)
Eosinophils Relative: 1 %
HCT: 37.5 % (ref 36.0–46.0)
Hemoglobin: 11.5 g/dL — ABNORMAL LOW (ref 12.0–15.0)
Immature Granulocytes: 1 %
Lymphocytes Relative: 4 %
Lymphs Abs: 0.8 10*3/uL (ref 0.7–4.0)
MCH: 27.6 pg (ref 26.0–34.0)
MCHC: 30.7 g/dL (ref 30.0–36.0)
MCV: 90.1 fL (ref 80.0–100.0)
Monocytes Absolute: 0.9 10*3/uL (ref 0.1–1.0)
Monocytes Relative: 4 %
Neutro Abs: 20.7 10*3/uL — ABNORMAL HIGH (ref 1.7–7.7)
Neutrophils Relative %: 90 %
Platelets: 213 10*3/uL (ref 150–400)
RBC: 4.16 MIL/uL (ref 3.87–5.11)
RDW: 15.6 % — ABNORMAL HIGH (ref 11.5–15.5)
WBC: 22.8 10*3/uL — ABNORMAL HIGH (ref 4.0–10.5)
nRBC: 0 % (ref 0.0–0.2)

## 2019-07-19 LAB — CBG MONITORING, ED: Glucose-Capillary: 148 mg/dL — ABNORMAL HIGH (ref 70–99)

## 2019-07-19 LAB — ETHANOL: Alcohol, Ethyl (B): <10 mg/dL (ref <10)

## 2019-07-19 LAB — RAPID URINE DRUG SCREEN, HOSP PERFORMED
Amphetamines: NOT DETECTED
Barbiturates: NOT DETECTED
Benzodiazepines: NOT DETECTED
Cocaine: NOT DETECTED
Opiates: NOT DETECTED
Tetrahydrocannabinol: NOT DETECTED

## 2019-07-19 LAB — MAGNESIUM: Magnesium: 1.6 mg/dL — ABNORMAL LOW (ref 1.7–2.4)

## 2019-07-19 LAB — LACTIC ACID, PLASMA: Lactic Acid, Venous: 1.2 mmol/L (ref 0.5–1.9)

## 2019-07-19 LAB — TSH: TSH: 1.028 u[IU]/mL (ref 0.350–4.500)

## 2019-07-19 NOTE — ED Triage Notes (Signed)
Sister reports that pt drove herself to dialysis this morning aprrox 5 am and she seemed fine. Pt returned for dialysis approx 9am and sat in car for 2 hours. When Pt made her way to house sister reports she was "loopy" wide eyes" and not acting right.. Pt reports that she couldn't get herself together  reports that she fell a couple weeks and is complaining of lower back pain into right hip and down leg

## 2019-07-19 NOTE — Discharge Instructions (Addendum)
There is not enough evidence at this time to say that you have an infection in your vertebrae of your back.  I discussed the case with the infectious disease doctor on call.  We have drawn blood cultures and you will be notified if they are positive for an infection.  Return here for any concerns.

## 2019-07-19 NOTE — ED Provider Notes (Signed)
Emergency Department Provider Note   I have reviewed the triage vital signs and the nursing notes.   HISTORY  Chief Complaint Altered Mental Status   HPI [name] is a 68 y.o. female with PMH of DM, HLD, HTN, and ESRD presents to the emergency department for evaluation of confusion.  Patient was last normal at 5 AM.  She spoke with her younger sister by phone who reports she seemed normal at that time.  Patient states that she went to dialysis and nearly completed her full treatment.  She states she did have to stop several minutes early because of lower back pain.  She describes pain in the middle of her back and radiating down her right leg.  She denies any abdominal or flank discomfort.  No urinary symptoms (patient does still make urine).  Denies fevers.  No numbness or weakness in the legs.  Denies chest pain or shortness of breath.  Patient states that she drove home after dialysis but "got confused" and apparently sat in her car, parked in her front lawn, for approximately 2 hours.  When family spoke with her they felt like she was "not acting right" and so drove her to the ED.   Past Medical History:  Diagnosis Date   Arthritis    hands   Constipation    Diabetes mellitus without complication ([place])    Type II   ESRD (end stage renal disease) ([place])     M/W/F- Hemodialysis   GERD (gastroesophageal reflux disease)    Headache    Hyperlipidemia    Hypertension    Irregular heart rate    Stroke ([place])    TIA - approx 2010- no residual    Patient Active Problem List   Diagnosis Date Noted   ESRD on dialysis ([place]) 05/15/2019   Anemia of chronic renal failure 04/03/2018   Essential hypertension 04/03/2018   Type 2 diabetes mellitus with diabetic nephropathy ([place]) 04/03/2018    Past Surgical History:  Procedure Laterality Date   ABDOMINAL HYSTERECTOMY     AORTIC ARCH ANGIOGRAPHY N/A 03/28/2019   Procedure: AORTIC ARCH ANGIOGRAPHY;  Surgeon: Waynetta Sandy, MD;  Location: [facility];  Service: Cardiovascular;  Laterality: N/A;   AV FISTULA PLACEMENT Left 06/13/2018   Procedure: ARTERIOVENOUS (AV) FISTULA CREATION;  Surgeon: Rosetta Posner, MD;  Location: [facility] [place];  Service: Vascular;  Laterality: Left;   AV FISTULA PLACEMENT Left 08/18/2018   Procedure: INSERTION OF 4-7MM X 45CM ARTERIOVENOUS (AV) GORE-TEX GRAFT LEFT  FOREARM;  Surgeon: Rosetta Posner, MD;  Location: [facility];  Service: Vascular;  Laterality: Left;   AV FISTULA PLACEMENT Right 04/06/2019   Procedure: INSERTION OF ARTERIOVENOUS (AV) GORE-TEX GRAFT RIGHT UPPER ARM;  Surgeon: Waynetta Sandy, MD;  Location: [facility];  Service: Vascular;  Laterality: Right;   AV FISTULA PLACEMENT Right 04/13/2019   Procedure: INSERTION OF ARTERIOVENOUS (AV) BOVINE  ARTEGRAFT GRAFT RIGHT UPPER EXTREMITY;  Surgeon: Serafina Mitchell, MD;  Location: [facility];  Service: Vascular;  Laterality: Right;   [place] REMOVAL Right 05/16/2019   Procedure: REMOVAL OF ARTERIOVENOUS GORETEX GRAFT ([place]) RIGHT ARM;  Surgeon: Angelia Mould, MD;  Location: [facility];  Service: Vascular;  Laterality: Right;   [place] Right 03/07/2019   Procedure: First Stage Bascilic Vein Transposition Right Arm;  Surgeon: Serafina Mitchell, MD;  Location: [facility];  Service: Vascular;  Laterality: Right;   CATARACT EXTRACTION Right 2005   INSERTION OF DIALYSIS  CATHETER N/A 08/18/2018   Procedure: INSERTION OF DIALYSIS CATHETER;  Surgeon: Rosetta Posner, MD;  [facility];  Service: Vascular;  Laterality: N/A;   IR FLUORO GUIDE CV LINE RIGHT  12/06/2018   IR PTA ADDL CENTRAL DIALYSIS SEG THRU DIALY CIRCUIT RIGHT Right 12/06/2018   THROMBECTOMY AND REVISION OF ARTERIOVENTOUS (AV) GORETEX  GRAFT Left 09/29/2018   Procedure: INSERTION OF ARTERIOVENTOUS (AV) GORETEX  GRAFT ARM;  Surgeon: Waynetta Sandy, MD;  Location: [facility];  Service: Vascular;  Laterality: Left;   UPPER EXTREMITY  ANGIOGRAPHY Right 03/28/2019   Procedure: UPPER EXTREMITY ANGIOGRAPHY;  Surgeon: Waynetta Sandy, MD;  Location: [facility];  Service: Cardiovascular;  Laterality: Right;   VENOGRAM  10/27/2018   Procedure: Venogram;  Surgeon: Marty Heck, MD;  Location: [facility];  Service: Cardiovascular;;  bilateral arm    Allergies Patient has no known allergies.  Family History  Problem Relation Age of Onset   Heart murmur Mother    Heart failure Mother    Diabetes Mother    Cirrhosis Father    Alcoholism Father     Social History Social History   Tobacco Use   Smoking status: Never Smoker   Smokeless tobacco: Never Used  Substance Use Topics   Alcohol use: Never    Frequency: Never   Drug use: Never    Review of Systems  Constitutional: No fever/chills. Feeling "out of it."  Eyes: No visual changes. ENT: No sore throat. Cardiovascular: Denies chest pain. Respiratory: Denies shortness of breath. Gastrointestinal: No abdominal pain.  No nausea, no vomiting.  No diarrhea.  No constipation. Genitourinary: Negative for dysuria. Musculoskeletal: Positive for back pain radiating down the right leg.  Skin: Negative for rash. Neurological: Negative for headaches, focal weakness or numbness.  10-point ROS otherwise negative.  ____________________________________________   PHYSICAL EXAM:  VITAL SIGNS: ED Triage Vitals  Enc Vitals Group     BP 07/19/19 1156 112/81     Pulse Rate 07/19/19 1156 (!) 119     Resp 07/19/19 1156 20     Temp 07/19/19 1156 98.4 F (36.9 C)     Temp Source 07/19/19 1156 Oral     SpO2 07/19/19 1156 98 %     Weight 07/19/19 1156 148 lb (67.1 kg)     Height 07/19/19 1156 5\' 7"  (1.702 m)   Constitutional: Alert. Able to provide a full history. Confused at times regarding the events of the AM. No acute distress.  Eyes: Conjunctivae are normal. PERRL.  Head: Atraumatic. Nose: No  congestion/rhinnorhea. Mouth/Throat: Mucous membranes are moist.  Neck: No stridor.   Cardiovascular: Tachycardia. Good peripheral circulation. Grossly normal heart sounds.   Respiratory: Normal respiratory effort.  No retractions. Lungs CTAB. Gastrointestinal: Soft and nontender. No distention.  Musculoskeletal: No lower extremity tenderness nor edema. No gross deformities of extremities. Neurologic:  Normal speech and language. No gross focal neurologic deficits are appreciated. Normal CN exam 2-12. No pronator drift. Normal sensation in the upper and lower extremities.  Skin:  Skin is warm, dry and intact. No rash noted.   ____________________________________________   LABS (all labs ordered are listed, but only abnormal results are displayed)  Labs Reviewed  COMPREHENSIVE METABOLIC PANEL - Abnormal; Notable for the following components:      Result Value   Glucose, Bld 145 (*)    BUN 28 (*)    Creatinine, Ser 4.44 (*)    Calcium 8.1 (*)    Albumin  2.9 (*)    GFR calc non Af Amer 10 (*)    GFR calc Af Amer 11 (*)    All other components within normal limits  URINALYSIS, ROUTINE W REFLEX MICROSCOPIC - Abnormal; Notable for the following components:   APPearance CLOUDY (*)    Hgb urine dipstick SMALL (*)    Protein, ur 100 (*)    Bacteria, UA RARE (*)    All other components within normal limits  MAGNESIUM - Abnormal; Notable for the following components:   Magnesium 1.6 (*)    All other components within normal limits  CBC WITH DIFFERENTIAL/PLATELET - Abnormal; Notable for the following components:   WBC 22.8 (*)    Hemoglobin 11.5 (*)    RDW 15.6 (*)    Neutro Abs 20.7 (*)    Abs Immature Granulocytes 0.27 (*)    All other components within normal limits  CULTURE, BLOOD (ROUTINE X 2)  CULTURE, BLOOD (ROUTINE X 2)  URINE CULTURE  LACTIC ACID, PLASMA  PROTIME-INR  TSH  ETHANOL  RAPID URINE DRUG SCREEN, HOSP PERFORMED  CBC WITH DIFFERENTIAL/PLATELET    ____________________________________________  EKG   EKG Interpretation  Date/Time:    Ventricular Rate:    PR Interval:    QRS Duration:   QT Interval:    QTC Calculation:   R Axis:     Text Interpretation:         ____________________________________________  RADIOLOGY  Dg Chest 2 View  Result Date: 07/19/2019 CLINICAL DATA:  Sepsis EXAM: CHEST - 2 VIEW COMPARISON:  August 18, 2018 FINDINGS: There is no edema or consolidation. Heart is upper normal in size with pulmonary vascularity normal. There is aortic atherosclerosis. No adenopathy. Central catheter tip is at the cavoatrial junction. No pneumothorax. No bone lesions. IMPRESSION: No edema or consolidation. Stable cardiac silhouette. No adenopathy. Central catheter tip at cavoatrial junction. No pneumothorax. Aortic Atherosclerosis (ICD10-I70.0). Electronically Signed   By: Lowella Grip III M.D.   On: 07/19/2019 14:05   Ct Head Wo Contrast  Result Date: 07/19/2019 CLINICAL DATA:  68 year old female with altered mental status after dialysis this morning. EXAM: CT HEAD WITHOUT CONTRAST TECHNIQUE: Contiguous axial images were obtained from the base of the skull through the vertex without intravenous contrast. COMPARISON:  Brain MRI 03/03/2018.  Head CT 08/22/2010. FINDINGS: Brain: Generalized cerebral volume loss since the 2011 CT. No ventriculomegaly. No midline shift, mass effect, or evidence of intracranial mass lesion. No acute intracranial hemorrhage identified. Stable small chronic infarcts in the left cerebellum and left thalamus. No definite cortical encephalomalacia. No cortically based acute infarct identified. Vascular: Extensive Calcified atherosclerosis at the skull base. No suspicious intracranial vascular hyperdensity. Skull: No acute osseous abnormality identified. Sinuses/Orbits: Visualized paranasal sinuses and mastoids are stable and well pneumatized. Other: Stable orbit and scalp soft tissues. IMPRESSION:  No acute intracranial abnormality identified. Chronic small vessel disease appears stable since the 2019 MRI. Electronically Signed   By: Genevie Ann M.D.   On: 07/19/2019 14:04   Mr Brain Wo Contrast  Result Date: 07/19/2019 CLINICAL DATA:  68 year old female with altered mental status after dialysis this morning. Back pain on the right radiating to the hip and down the right leg since this morning. Fall several weeks ago. EXAM: MRI HEAD WITHOUT CONTRAST TECHNIQUE: Multiplanar, multiecho pulse sequences of the brain and surrounding structures were obtained without intravenous contrast. COMPARISON:  Head CT and CT Abdomen and Pelvis earlier today. [facility] Brain MRI 03/03/2018. FINDINGS: Brain: No restricted  diffusion to suggest acute infarction. No midline shift, mass effect, evidence of mass lesion, ventriculomegaly, extra-axial collection or acute intracranial hemorrhage. Cervicomedullary junction and pituitary are within normal limits. Small chronic infarcts in the left cerebellum and left thalamus are stable. Widespread increased perivascular spaces are stable. Mild for age cerebral white matter T2 and FLAIR hyperintensity. No cortical encephalomalacia. No chronic blood products. Vascular: Major intracranial vascular flow voids are stable. Skull and upper cervical spine: Generalized decreased T1 marrow signal, probably related to renal osteodystrophy in this clinical setting. Otherwise negative visible cervical spine. Sinuses/Orbits: Stable, negative. Other: Mastoids remain clear. Grossly normal visible internal auditory structures. Scalp and face soft tissues appear negative. IMPRESSION: No acute intracranial abnormality. Stable chronic small vessel disease since 2019, primarily in the left thalamus and cerebellum. Electronically Signed   By: Genevie Ann M.D.   On: 07/19/2019 17:11   Mr Lumbar Spine Wo Contrast  Result Date: 07/19/2019 CLINICAL DATA:  68 year old female with altered mental  status after dialysis this morning. Back pain on the right radiating to the hip and down the right leg since this morning. Fall several weeks ago. EXAM: MRI LUMBAR SPINE WITHOUT CONTRAST TECHNIQUE: Multiplanar, multisequence MR imaging of the lumbar spine was performed. No intravenous contrast was administered. COMPARISON:  Head CT and CT Abdomen and Pelvis earlier today. [facility] Brain MRI 03/03/2018. FINDINGS: Segmentation:  Normal on the comparison CT. Alignment: Stable straightening of lumbar lordosis as seen by CT earlier today. Vertebrae: Patchy marrow edema in both the L3 and L4 vertebral bodies. The pedicles and posterior elements appear spared. And there appear to be defects about the L3-L4 disc, probably allowing for herniation of disc material into the bodies. There is faint associated edema in the medial psoas muscles at this level (more so the right series 16, image 3), but no other regional soft tissue inflammation. Bone marrow signal is highly heterogeneous throughout the remaining visible spine and pelvis, with geographic areas of decreased T1 marrow signal throughout, but no associated marrow edema. The visible SI joints appear intact. Conus medullaris and cauda equina: Conus extends to the L1-L2 level. No lower spinal cord or conus signal abnormality. Paraspinal and other soft tissues: Stable from the earlier CT. A degree of renal atrophy is noted. Disc levels: Mild for age disc degeneration. There is disc bulging at L2-L3 through L5-S1. There is mild multifactorial spinal stenosis at L3-L4 when combined with posterior element hypertrophy and endplate spurring. And there is similar multifactorial moderate bilateral L3 through L5 neural foraminal stenosis. IMPRESSION: 1. Abnormal L3 and L4 vertebral bodies and intervening disc as seen by CT. Although Infectious Discitis Osteomyelitis is possible, this could also be the sequelae of hemodialysis related spondyloarthropathy and/or  intravertebral disc herniations (Schmorl's nodes). Blood cultures today are pending, and if negative recommend a short interval repeat MRI without contrast (e.g. 3-4 weeks) to document stability. 2. Markedly heterogeneous marrow signal throughout the visible spine and pelvis, but absent marrow edema except #1. Favor renal osteodystrophy. 3. Degenerative mild spinal stenosis at L3-L4, and moderate bilateral L3 through L5 neural foraminal stenosis. Electronically Signed   By: Genevie Ann M.D.   On: 07/19/2019 17:29   Ct Renal Stone Study  Result Date: 07/19/2019 CLINICAL DATA:  Flank pain, also recent fall EXAM: CT ABDOMEN AND PELVIS WITHOUT CONTRAST TECHNIQUE: Multidetector CT imaging of the abdomen and pelvis was performed following the standard protocol without IV contrast. COMPARISON:  None FINDINGS: Lower chest: Dependent ground-glass opacity in the lung  bases likely reflective of atelectasis. Borderline cardiomegaly with trace pericardial effusion. Extensive atherosclerotic calcification of the coronary arteries. Calcification of the aortic leaflets. A dialysis catheter tip is partially visualized at the level of the right atrium. Hepatobiliary: No focal liver abnormality is seen. No gallstones, gallbladder wall thickening, or biliary dilatation. Pancreas: Unremarkable. No pancreatic ductal dilatation or surrounding inflammatory changes. Spleen: Normal in size without focal abnormality. Adrenals/Urinary Tract: Normal appearance of the adrenal glands. Both kidneys appear atrophic. A 14 13 mm fluid attenuation cysts is seen in the interpolar region right kidney. A smaller 5 mm hyperattenuating exophytic, potentially cystic lesion arising from the left upper pole. Similar 3 mm hyperattenuating lesion is seen in the posterior right upper pole. The smaller lesion is indeterminate on this noncontrast enhanced study. No visible urolithiasis or hydronephrosis. Mild left urothelial thickening at the renal pelvis is  noted. Bladder is decompressed with circumferential wall thickening though this is somewhat greater than expected with mild perivesicular haze. Stomach/Bowel: Moderate hiatal hernia. Distal stomach and duodenum are unremarkable. No small bowel dilatation or wall thickening. There is borderline dilation up to 8 mm of a fluid-filled appendix seen in the right lower quadrant without significant periappendiceal fat stranding or pericecal inflammation. Scattered colonic diverticula without focal pericolonic inflammation to suggest diverticulitis. Vascular/Lymphatic: Extensive atherosclerotic calcification is seen of the aorta and branch vessels. Evaluation for luminal stenosis is limited in absence of contrast media. No suspicious or enlarged lymph nodes in the included lymphatic chains. Reproductive: Uterus is surgically absent. No concerning adnexal lesions. Other: No abdominopelvic free fluid or free gas. No bowel containing hernias. Postsurgical changes of the anterior abdomen. Musculoskeletal: There is diffusely includes sclerosis of the L3 and L4 vertebral bodies with multilobulated lytic appearing lesions involving centered upon the disc space which also demonstrates central hypoattenuation. Please soft tissue thickening anteriorly at this level as well. Concerning lead there is an additional lucent lesion within the left sacral ala though this could reflect a separate process as the appearance is discs similar and is likely more compatible with irregular marrow heterogeneity in the setting of dialysis dependence. IMPRESSION: 1. Sclerosis of L3 and 4 with irregular lucencies in the end plates and hypoattenuation of the intervertebral disc space with adjacent hazy stranding. Findings are concerning for discitis/osteomyelitis. Recommend evaluation with a noncontrast MRI of the lumbar spine for further evaluation given patient's renal compromise. 2. Presence of an additional lucent lesion in the left sacral ala  however the appearance is dissimilar to the aforementioned lucency within L3 and L4. Finding could reflect irregular marrow heterogeneity in the setting of dialysis dependence the malignancy is excluded. Will be better evaluated on MR as well. 3. Mild left urothelial thickening at the left renal pelvis and questionable thickening of the urinary bladder, nonspecific, but can be seen with ascending infection. Correlate with urinalysis. 4. Borderline dilation of a fluid-filled appendix up to 8 mm without significant periappendiceal fat stranding or pericecal inflammation. Most likely normal for this patient unless additional clinical findings are present to suggest appendicitis. 5. Moderate hiatal hernia. 6. Two subcentimeter, hyperattenuating, potentially cystic lesion arises from the upper pole of both kidneys. Consider further evaluation with nonemergent renal ultrasound for further evaluation. 7. Aortic Atherosclerosis (ICD10-I70.0). These results and recommendations were called by telephone at the time of interpretation on 07/19/2019 at 2:25 pm to Dr. Nanda Quinton , who verbally acknowledged these results. Electronically Signed   By: Lovena Le M.D.   On: 07/19/2019 14:26    ____________________________________________  PROCEDURES  Procedure(s) performed:   Procedures  none ____________________________________________   INITIAL IMPRESSION / ASSESSMENT AND PLAN / ED COURSE  Pertinent labs & imaging results that were available during my care of the patient were reviewed by me and considered in my medical decision making (see chart for details).   Patient presents to the emergency department with altered mental status.  Last seen normal at 5 AM.  Patient is without focal neurologic deficits on my exam.  Patient more encephalopathic (mild) and confused regarding events of the morning.  Afebrile.  No headache.  Lower suspicion for infectious etiology.  No indication to activate code stroke. Plan  for broad w/u for AMS at this time.   02:13 PM  Spoke with radiologist regarding the CT renal scan.  Read is limited due to lack of contrast but patient does have some findings in the lumbar spine and S1.  Unclear if this represents malignancy versus discitis.  Given the patient's altered mental status I favor possible discitis.  I will send the patient for MRI of the lumbar spine without contrast to include S1 as well. If needed, can send back for contrast but will start without with ESRD per discussion with neuro radiology.  Given the AMS will also obtain MR brain w/o contrast to evaluate for CVA. Labs pending. Updated patient and family at bedside. Family report that the patient is acting more like herself and appears to have returned to her normal self.   Care transferred to Dr. Lacinda Axon pending MRI and remaining labs.  ____________________________________________  FINAL CLINICAL IMPRESSION(S) / ED DIAGNOSES  Final diagnoses:  Transient alteration of awareness  Acute midline low back pain with right-sided sciatica    Note:  This document was prepared using Dragon voice recognition software and may include unintentional dictation errors.  Nanda Quinton, MD Emergency Medicine    Latice Waitman, [facility], MD 07/19/19 [phone]

## 2019-07-19 NOTE — ED Provider Notes (Signed)
Discussed MRI lumbar spine results with infectious disease doctor on call.  Recommend waiting results for blood cultures.  These findings were discussed with the patient and her sister.  They understand that blood cultures are pending and that she can return anytime for further evaluation.  Nontoxic-appearing at discharge.   Nat Christen, MD 07/19/19 1946

## 2019-07-19 NOTE — ED Notes (Signed)
Pt transported to MRI 

## 2019-07-20 ENCOUNTER — Other Ambulatory Visit: Payer: Self-pay

## 2019-07-20 ENCOUNTER — Inpatient Hospital Stay (HOSPITAL_COMMUNITY)
Admission: EM | Admit: 2019-07-20 | Discharge: 2019-07-26 | DRG: 871 | Disposition: A | Discharge status: Home / Self Care | Payer: Medicare PPO | Attending: Family Medicine | Admitting: Family Medicine

## 2019-07-20 ENCOUNTER — Telehealth (HOSPITAL_BASED_OUTPATIENT_CLINIC_OR_DEPARTMENT_OTHER): Payer: Self-pay | Admitting: *Deleted

## 2019-07-20 ENCOUNTER — Emergency Department (HOSPITAL_COMMUNITY): Payer: Medicare PPO

## 2019-07-20 ENCOUNTER — Encounter (HOSPITAL_COMMUNITY): Payer: Self-pay | Admitting: Emergency Medicine

## 2019-07-20 DIAGNOSIS — R7881 Bacteremia: Secondary | ICD-10-CM

## 2019-07-20 DIAGNOSIS — B9689 Other specified bacterial agents as the cause of diseases classified elsewhere: Secondary | ICD-10-CM | POA: Diagnosis present

## 2019-07-20 DIAGNOSIS — Z992 Dependence on renal dialysis: Secondary | ICD-10-CM | POA: Diagnosis not present

## 2019-07-20 DIAGNOSIS — N186 End stage renal disease: Secondary | ICD-10-CM

## 2019-07-20 DIAGNOSIS — Z23 Encounter for immunization: Secondary | ICD-10-CM | POA: Diagnosis present

## 2019-07-20 DIAGNOSIS — E785 Hyperlipidemia, unspecified: Secondary | ICD-10-CM | POA: Diagnosis present

## 2019-07-20 DIAGNOSIS — A4152 Sepsis due to Pseudomonas: Secondary | ICD-10-CM | POA: Diagnosis present

## 2019-07-20 DIAGNOSIS — I10 Essential (primary) hypertension: Secondary | ICD-10-CM | POA: Diagnosis present

## 2019-07-20 DIAGNOSIS — E1169 Type 2 diabetes mellitus with other specified complication: Secondary | ICD-10-CM | POA: Diagnosis present

## 2019-07-20 DIAGNOSIS — Z794 Long term (current) use of insulin: Secondary | ICD-10-CM

## 2019-07-20 DIAGNOSIS — N250 Renal osteodystrophy: Secondary | ICD-10-CM | POA: Diagnosis present

## 2019-07-20 DIAGNOSIS — E1122 Type 2 diabetes mellitus with diabetic chronic kidney disease: Secondary | ICD-10-CM | POA: Diagnosis present

## 2019-07-20 DIAGNOSIS — G9341 Metabolic encephalopathy: Secondary | ICD-10-CM | POA: Diagnosis present

## 2019-07-20 DIAGNOSIS — E875 Hyperkalemia: Secondary | ICD-10-CM | POA: Diagnosis present

## 2019-07-20 DIAGNOSIS — I313 Pericardial effusion (noninflammatory): Secondary | ICD-10-CM | POA: Diagnosis not present

## 2019-07-20 DIAGNOSIS — M1990 Unspecified osteoarthritis, unspecified site: Secondary | ICD-10-CM | POA: Diagnosis present

## 2019-07-20 DIAGNOSIS — R443 Hallucinations, unspecified: Secondary | ICD-10-CM | POA: Diagnosis present

## 2019-07-20 DIAGNOSIS — E1121 Type 2 diabetes mellitus with diabetic nephropathy: Secondary | ICD-10-CM | POA: Diagnosis present

## 2019-07-20 DIAGNOSIS — F0390 Unspecified dementia without behavioral disturbance: Secondary | ICD-10-CM | POA: Diagnosis present

## 2019-07-20 DIAGNOSIS — Z7901 Long term (current) use of anticoagulants: Secondary | ICD-10-CM

## 2019-07-20 DIAGNOSIS — Z8249 Family history of ischemic heart disease and other diseases of the circulatory system: Secondary | ICD-10-CM

## 2019-07-20 DIAGNOSIS — M4640 Discitis, unspecified, site unspecified: Secondary | ICD-10-CM | POA: Diagnosis present

## 2019-07-20 DIAGNOSIS — K219 Gastro-esophageal reflux disease without esophagitis: Secondary | ICD-10-CM | POA: Diagnosis present

## 2019-07-20 DIAGNOSIS — K5900 Constipation, unspecified: Secondary | ICD-10-CM | POA: Diagnosis present

## 2019-07-20 DIAGNOSIS — Z7952 Long term (current) use of systemic steroids: Secondary | ICD-10-CM

## 2019-07-20 DIAGNOSIS — Z7982 Long term (current) use of aspirin: Secondary | ICD-10-CM

## 2019-07-20 DIAGNOSIS — Z20828 Contact with and (suspected) exposure to other viral communicable diseases: Secondary | ICD-10-CM | POA: Diagnosis present

## 2019-07-20 DIAGNOSIS — Z811 Family history of alcohol abuse and dependence: Secondary | ICD-10-CM

## 2019-07-20 DIAGNOSIS — B965 Pseudomonas (aeruginosa) (mallei) (pseudomallei) as the cause of diseases classified elsewhere: Secondary | ICD-10-CM | POA: Diagnosis present

## 2019-07-20 DIAGNOSIS — Z79899 Other long term (current) drug therapy: Secondary | ICD-10-CM

## 2019-07-20 DIAGNOSIS — I120 Hypertensive chronic kidney disease with stage 5 chronic kidney disease or end stage renal disease: Secondary | ICD-10-CM | POA: Diagnosis present

## 2019-07-20 DIAGNOSIS — Z8673 Personal history of transient ischemic attack (TIA), and cerebral infarction without residual deficits: Secondary | ICD-10-CM

## 2019-07-20 DIAGNOSIS — Z90710 Acquired absence of both cervix and uterus: Secondary | ICD-10-CM

## 2019-07-20 DIAGNOSIS — Z7902 Long term (current) use of antithrombotics/antiplatelets: Secondary | ICD-10-CM

## 2019-07-20 DIAGNOSIS — G9340 Encephalopathy, unspecified: Secondary | ICD-10-CM | POA: Diagnosis present

## 2019-07-20 DIAGNOSIS — M4626 Osteomyelitis of vertebra, lumbar region: Secondary | ICD-10-CM | POA: Diagnosis present

## 2019-07-20 DIAGNOSIS — Z833 Family history of diabetes mellitus: Secondary | ICD-10-CM

## 2019-07-20 DIAGNOSIS — R6520 Severe sepsis without septic shock: Secondary | ICD-10-CM | POA: Diagnosis present

## 2019-07-20 DIAGNOSIS — D649 Anemia, unspecified: Secondary | ICD-10-CM | POA: Diagnosis present

## 2019-07-20 DIAGNOSIS — Z791 Long term (current) use of non-steroidal anti-inflammatories (NSAID): Secondary | ICD-10-CM

## 2019-07-20 LAB — GLUCOSE, CAPILLARY
Glucose-Capillary: 107 mg/dL — ABNORMAL HIGH (ref 70–99)
Glucose-Capillary: 142 mg/dL — ABNORMAL HIGH (ref 70–99)

## 2019-07-20 LAB — URINALYSIS, ROUTINE W REFLEX MICROSCOPIC
Bilirubin Urine: NEGATIVE
Glucose, UA: NEGATIVE mg/dL
Ketones, ur: NEGATIVE mg/dL
Leukocytes,Ua: NEGATIVE
Nitrite: NEGATIVE
Protein, ur: >=300 mg/dL — AB
Specific Gravity, Urine: 1.012 (ref 1.005–1.030)
pH: 8 (ref 5.0–8.0)

## 2019-07-20 LAB — COMPREHENSIVE METABOLIC PANEL
ALT: 16 U/L (ref 0–44)
AST: 16 U/L (ref 15–41)
Albumin: 3.1 g/dL — ABNORMAL LOW (ref 3.5–5.0)
Alkaline Phosphatase: 116 U/L (ref 38–126)
Anion gap: 11 (ref 5–15)
BUN: 42 mg/dL — ABNORMAL HIGH (ref 8–23)
CO2: 28 mmol/L (ref 22–32)
Calcium: 8.5 mg/dL — ABNORMAL LOW (ref 8.9–10.3)
Chloride: 98 mmol/L (ref 98–111)
Creatinine, Ser: 6.39 mg/dL — ABNORMAL HIGH (ref 0.44–1.00)
GFR calc Af Amer: 7 mL/min — ABNORMAL LOW (ref >60)
GFR calc non Af Amer: 6 mL/min — ABNORMAL LOW (ref >60)
Glucose, Bld: 152 mg/dL — ABNORMAL HIGH (ref 70–99)
Potassium: 4.9 mmol/L (ref 3.5–5.1)
Sodium: 137 mmol/L (ref 135–145)
Total Bilirubin: 0.5 mg/dL (ref 0.3–1.2)
Total Protein: 8.3 g/dL — ABNORMAL HIGH (ref 6.5–8.1)

## 2019-07-20 LAB — CBC WITH DIFFERENTIAL/PLATELET
Abs Immature Granulocytes: 0.14 10*3/uL — ABNORMAL HIGH (ref 0.00–0.07)
Basophils Absolute: 0 10*3/uL (ref 0.0–0.1)
Basophils Relative: 0 %
Eosinophils Absolute: 0.1 10*3/uL (ref 0.0–0.5)
Eosinophils Relative: 0 %
HCT: 41.4 % (ref 36.0–46.0)
Hemoglobin: 12.2 g/dL (ref 12.0–15.0)
Immature Granulocytes: 1 %
Lymphocytes Relative: 11 %
Lymphs Abs: 2.2 10*3/uL (ref 0.7–4.0)
MCH: 27.2 pg (ref 26.0–34.0)
MCHC: 29.5 g/dL — ABNORMAL LOW (ref 30.0–36.0)
MCV: 92.2 fL (ref 80.0–100.0)
Monocytes Absolute: 1.3 10*3/uL — ABNORMAL HIGH (ref 0.1–1.0)
Monocytes Relative: 7 %
Neutro Abs: 15.4 10*3/uL — ABNORMAL HIGH (ref 1.7–7.7)
Neutrophils Relative %: 81 %
Platelets: 203 10*3/uL (ref 150–400)
RBC: 4.49 MIL/uL (ref 3.87–5.11)
RDW: 15.9 % — ABNORMAL HIGH (ref 11.5–15.5)
WBC: 19.1 10*3/uL — ABNORMAL HIGH (ref 4.0–10.5)
nRBC: 0 % (ref 0.0–0.2)

## 2019-07-20 LAB — BLOOD CULTURE ID PANEL (REFLEXED)

## 2019-07-20 LAB — PROTIME-INR
INR: 1 (ref 0.8–1.2)
Prothrombin Time: 13.4 seconds (ref 11.4–15.2)

## 2019-07-20 LAB — URINE CULTURE
Culture: <10000 — AB
Special Requests: NORMAL

## 2019-07-20 LAB — SARS CORONAVIRUS 2 BY RT PCR (HOSPITAL ORDER, PERFORMED IN ~~LOC~~ HOSPITAL LAB): SARS Coronavirus 2: NEGATIVE

## 2019-07-20 LAB — SEDIMENTATION RATE: Sed Rate: 75 mm/hr — ABNORMAL HIGH (ref 0–22)

## 2019-07-20 MED ORDER — ACETAMINOPHEN 650 MG RE SUPP: 650.0000 mg | Freq: Four times a day (QID) | RECTAL | Status: DC | PRN

## 2019-07-20 MED ORDER — SODIUM CHLORIDE 0.9 % IV SOLN
2.0000 g | Freq: Once | INTRAVENOUS | Status: AC
  Administered 2019-07-20: 15:00:00 2 g via INTRAVENOUS
  Filled 2019-07-20: qty 2

## 2019-07-20 MED ORDER — SODIUM CHLORIDE 0.9 % IV SOLN: 1.0000 g | INTRAVENOUS | Status: DC

## 2019-07-20 MED ORDER — AMLODIPINE BESYLATE 5 MG PO TABS
10.0000 mg | ORAL_TABLET | Freq: Every day | ORAL | Status: DC
  Administered 2019-07-23 – 2019-07-26 (×3): 10 mg via ORAL
  Filled 2019-07-20 (×3): qty 2

## 2019-07-20 MED ORDER — ASPIRIN EC 81 MG PO TBEC
81.0000 mg | DELAYED_RELEASE_TABLET | Freq: Every day | ORAL | Status: DC
  Administered 2019-07-21 – 2019-07-26 (×4): 81 mg via ORAL
  Filled 2019-07-20 (×4): qty 1

## 2019-07-20 MED ORDER — FLUTICASONE PROPIONATE 50 MCG/ACT NA SUSP: 1.0000 | Freq: Every day | NASAL | Status: DC | PRN

## 2019-07-20 MED ORDER — SODIUM CHLORIDE 0.9 % IV SOLN: 250.0000 mL | INTRAVENOUS | Status: DC | PRN

## 2019-07-20 MED ORDER — ACETAMINOPHEN 325 MG PO TABS
650.0000 mg | ORAL_TABLET | Freq: Four times a day (QID) | ORAL | Status: DC | PRN
  Administered 2019-07-20 – 2019-07-21 (×2): 650 mg via ORAL
  Filled 2019-07-20 (×2): qty 2

## 2019-07-20 MED ORDER — HYDRALAZINE HCL 20 MG/ML IJ SOLN
INTRAMUSCULAR | Status: AC
  Administered 2019-07-20: 17:00:00 10 mg via INTRAVENOUS
  Filled 2019-07-20: qty 1

## 2019-07-20 MED ORDER — RENA-VITE PO TABS
1.0000 | ORAL_TABLET | Freq: Every day | ORAL | Status: DC
  Administered 2019-07-23 – 2019-07-24 (×2): 1 via ORAL
  Filled 2019-07-20 (×3): qty 1

## 2019-07-20 MED ORDER — CARVEDILOL 3.125 MG PO TABS
6.2500 mg | ORAL_TABLET | Freq: Two times a day (BID) | ORAL | Status: DC
  Administered 2019-07-20 – 2019-07-22 (×3): 6.25 mg via ORAL
  Filled 2019-07-20 (×3): qty 2

## 2019-07-20 MED ORDER — SODIUM CHLORIDE 0.9 % IV SOLN: 2.0000 g | INTRAVENOUS | Status: DC

## 2019-07-20 MED ORDER — SODIUM CHLORIDE 0.9% FLUSH
3.0000 mL | Freq: Two times a day (BID) | INTRAVENOUS | Status: DC
  Administered 2019-07-20 – 2019-07-26 (×12): 3 mL via INTRAVENOUS

## 2019-07-20 MED ORDER — INSULIN ASPART 100 UNIT/ML ~~LOC~~ SOLN
0.0000 [IU] | Freq: Every day | SUBCUTANEOUS | Status: DC
  Administered 2019-07-24: 08:00:00 2 [IU] via SUBCUTANEOUS

## 2019-07-20 MED ORDER — PNEUMOCOCCAL VAC POLYVALENT 25 MCG/0.5ML IJ INJ
0.5000 mL | INJECTION | INTRAMUSCULAR | Status: AC
  Administered 2019-07-21: 13:00:00 0.5 mL via INTRAMUSCULAR
  Filled 2019-07-20: qty 0.5

## 2019-07-20 MED ORDER — HEPARIN SODIUM (PORCINE) 5000 UNIT/ML IJ SOLN
5000.0000 [IU] | Freq: Three times a day (TID) | INTRAMUSCULAR | Status: AC
  Administered 2019-07-20 – 2019-07-24 (×10): 5000 [IU] via SUBCUTANEOUS
  Filled 2019-07-20 (×10): qty 1

## 2019-07-20 MED ORDER — SODIUM CHLORIDE 0.9% FLUSH
3.0000 mL | INTRAVENOUS | Status: DC | PRN
  Administered 2019-07-24: 10:00:00 3 mL via INTRAVENOUS
  Filled 2019-07-20: qty 3

## 2019-07-20 MED ORDER — ISOSORBIDE MONONITRATE ER 60 MG PO TB24
30.0000 mg | ORAL_TABLET | Freq: Every day | ORAL | Status: DC
  Administered 2019-07-23 – 2019-07-26 (×3): 30 mg via ORAL
  Filled 2019-07-20 (×3): qty 1

## 2019-07-20 MED ORDER — HYDRALAZINE HCL 20 MG/ML IJ SOLN
10.0000 mg | Freq: Four times a day (QID) | INTRAMUSCULAR | Status: DC | PRN
  Administered 2019-07-20 – 2019-07-21 (×3): 10 mg via INTRAVENOUS
  Filled 2019-07-20 (×3): qty 1

## 2019-07-20 MED ORDER — NEPRO/CARBSTEADY PO LIQD
237.0000 mL | Freq: Two times a day (BID) | ORAL | Status: DC
  Administered 2019-07-23 (×2): 237 mL via ORAL

## 2019-07-20 MED ORDER — CLOPIDOGREL BISULFATE 75 MG PO TABS
75.0000 mg | ORAL_TABLET | Freq: Every day | ORAL | Status: DC
  Administered 2019-07-20 – 2019-07-24 (×4): 75 mg via ORAL
  Filled 2019-07-20 (×4): qty 1

## 2019-07-20 MED ORDER — NEPRO/CARBSTEADY PO LIQD: 237.0000 mL | Freq: Two times a day (BID) | ORAL | Status: DC

## 2019-07-20 MED ORDER — CALCIUM CARBONATE ANTACID 500 MG PO CHEW
1.0000 | CHEWABLE_TABLET | Freq: Two times a day (BID) | ORAL | Status: DC
  Administered 2019-07-20 – 2019-07-26 (×10): 200 mg via ORAL
  Filled 2019-07-20 (×11): qty 1

## 2019-07-20 MED ORDER — INSULIN ASPART 100 UNIT/ML ~~LOC~~ SOLN
0.0000 [IU] | Freq: Three times a day (TID) | SUBCUTANEOUS | Status: DC
  Administered 2019-07-21 (×2): 2 [IU] via SUBCUTANEOUS
  Administered 2019-07-21: 08:00:00 1 [IU] via SUBCUTANEOUS
  Administered 2019-07-23: 09:00:00 2 [IU] via SUBCUTANEOUS
  Administered 2019-07-23: 17:00:00 5 [IU] via SUBCUTANEOUS
  Administered 2019-07-23: 12:00:00 2 [IU] via SUBCUTANEOUS
  Administered 2019-07-24: 17:00:00 1 [IU] via SUBCUTANEOUS
  Administered 2019-07-24 – 2019-07-25 (×3): 2 [IU] via SUBCUTANEOUS
  Administered 2019-07-26: 1 [IU] via SUBCUTANEOUS
  Administered 2019-07-26: 2 [IU] via SUBCUTANEOUS

## 2019-07-20 NOTE — ED Triage Notes (Signed)
Patient states she was contacted by someone from the hospital and told to come back to the ED for abnormal lab.

## 2019-07-20 NOTE — ED Triage Notes (Signed)
Gram negative rods seen on blood culture. Patient instructed to return to ED for eval.

## 2019-07-20 NOTE — Progress Notes (Signed)
Pharmacy Antibiotic Note  [name] is a 68 y.o. female admitted on 07/20/2019 with gram negative rod bacteremia.  Pharmacy has been consulted for Cefepime dosing.  Plan: Cefepime 2000 mg IV every M-W-F with HD  Monitor labs, c/s, and patient improvement.  Height: 5\' 7"  (170.2 cm) Weight: 148 lb (67.1 kg) IBW/kg (Calculated) : 61.6  Temp (24hrs), Avg:98.2 F (36.8 C), Min:98.2 F (36.8 C), Max:98.2 F (36.8 C)  Recent Labs  Lab 07/19/19 1336 07/19/19 1454 07/20/19 1445  WBC  --  22.8* 19.1*  CREATININE 4.44*  --  6.39*  LATICACIDVEN 1.2  --   --     Estimated Creatinine Clearance: 8.2 mL/min (A) (by C-G formula based on SCr of 6.39 mg/dL (H)).    No Known Allergies  Antimicrobials this admission: Cefepime 8/27 >>     Dose adjustments this admission: Cefepime  Microbiology results: 8/26 BCx: gram neg rods 8/27 BCx: pending  Thank you for allowing pharmacy to be a part of this patient's care.  Ramond Craver 07/20/2019 4:19 PM

## 2019-07-20 NOTE — ED Provider Notes (Signed)
[facility] EMERGENCY DEPARTMENT Provider Note   CSN: [id number] Arrival date & time: 07/20/19  1251     History   Chief Complaint Chief Complaint  Patient presents with   Abnormal Lab    HPI [name] is a 68 y.o. female with a past medical history of ESRD on dialysis MWF, GERD, prior stroke, hypertension, type 2 diabetes returns to ED for positive blood cultures.  Patient was seen and evaluated yesterday for altered mental status.  Work-up revealed possible discitis seen on lumbar MRI.  Blood cultures were collected yesterday, she was told after consultation with infectious disease that we should wait on blood culture results before treating her.  Patient states that she is continued back pain in her lower back for the past 2 weeks.  She has not missed any dialysis sessions.  She reports shortness of breath which usually resolves with dialysis.  Denies any fever, cough although she does note having night sweats for the past several weeks.  Denies chest pain, leg swelling, prior back surgeries, numbness in arms or legs.     HPI  Past Medical History:  Diagnosis Date   Arthritis    hands   Constipation    Diabetes mellitus without complication ([place])    Type II   ESRD (end stage renal disease) ([place])     M/W/F- Hemodialysis   GERD (gastroesophageal reflux disease)    Headache    Hyperlipidemia    Hypertension    Irregular heart rate    Stroke ([place])    TIA - approx 2010- no residual    Patient Active Problem List   Diagnosis Date Noted   ESRD on dialysis ([place]) 05/15/2019   Anemia of chronic renal failure 04/03/2018   Essential hypertension 04/03/2018   Type 2 diabetes mellitus with diabetic nephropathy ([place]) 04/03/2018    Past Surgical History:  Procedure Laterality Date   ABDOMINAL HYSTERECTOMY     AORTIC ARCH ANGIOGRAPHY N/A 03/28/2019   Procedure: AORTIC ARCH ANGIOGRAPHY;  Surgeon: Waynetta Sandy, MD;  Location: [place] [facility];   Service: Cardiovascular;  Laterality: N/A;   AV FISTULA PLACEMENT Left 06/13/2018   Procedure: ARTERIOVENOUS (AV) FISTULA CREATION;  Surgeon: Rosetta Posner, MD;  Location: [place];  Service: Vascular;  Laterality: Left;   AV FISTULA PLACEMENT Left 08/18/2018   Procedure: INSERTION OF 4-7MM X 45CM ARTERIOVENOUS (AV) GORE-TEX GRAFT LEFT  FOREARM;  Surgeon: Rosetta Posner, MD;  Location: [place];  Service: Vascular;  Laterality: Left;   AV FISTULA PLACEMENT Right 04/06/2019   Procedure: INSERTION OF ARTERIOVENOUS (AV) GORE-TEX GRAFT RIGHT UPPER ARM;  Surgeon: Waynetta Sandy, MD;  Location: [place];  Service: Vascular;  Laterality: Right;   AV FISTULA PLACEMENT Right 04/13/2019   Procedure: INSERTION OF ARTERIOVENOUS (AV) BOVINE  ARTEGRAFT GRAFT RIGHT UPPER EXTREMITY;  Surgeon: Serafina Mitchell, MD;  Location: [place];  Service: Vascular;  Laterality: Right;   [place] REMOVAL Right 05/16/2019   Procedure: REMOVAL OF ARTERIOVENOUS GORETEX GRAFT ([place]) RIGHT ARM;  Surgeon: Angelia Mould, MD;  Location: [place];  Service: Vascular;  Laterality: Right;   [place] Right 03/07/2019   Procedure: First Stage Bascilic Vein Transposition Right Arm;  Surgeon: Serafina Mitchell, MD;  Location: [place];  Service: Vascular;  Laterality: Right;   CATARACT EXTRACTION Right 2005   INSERTION OF DIALYSIS CATHETER N/A 08/18/2018   Procedure: INSERTION OF DIALYSIS CATHETER;  Surgeon: Rosetta Posner, MD;  Location: [place];  Service: Vascular;  Laterality: N/A;   IR FLUORO GUIDE CV LINE RIGHT  12/06/2018   IR PTA ADDL CENTRAL DIALYSIS SEG THRU DIALY CIRCUIT RIGHT Right 12/06/2018   THROMBECTOMY AND REVISION OF ARTERIOVENTOUS (AV) GORETEX  GRAFT Left 09/29/2018   Procedure: INSERTION OF ARTERIOVENTOUS (AV) GORETEX  GRAFT ARM;  Surgeon: Waynetta Sandy, MD;  Location: [place];  Service: Vascular;  Laterality: Left;   UPPER EXTREMITY ANGIOGRAPHY Right 03/28/2019   Procedure: UPPER EXTREMITY  ANGIOGRAPHY;  Surgeon: Waynetta Sandy, MD;  Location: [place] [facility];  Service: Cardiovascular;  Laterality: Right;   VENOGRAM  10/27/2018   Procedure: Venogram;  Surgeon: Marty Heck, MD;  Location: [place] [facility];  Service: Cardiovascular;;  bilateral arm     OB History    Gravida  1   Para  1   Term  1   Preterm      AB      Living        SAB      TAB      Ectopic      Multiple      Live Births               Home Medications    Prior to Admission medications   Medication Sig Start Date End Date Taking? Authorizing Provider  amLODipine (NORVASC) 10 MG tablet Take 10 mg by mouth daily after lunch.    Yes [provider]  aspirin EC 81 MG tablet Take 81 mg by mouth daily after lunch.    Yes [provider]  calcium carbonate (TUMS - DOSED IN MG ELEMENTAL CALCIUM) 500 MG chewable tablet Chew 1 tablet by mouth 2 (two) times daily. With lunch & supper   Yes [provider]  carvedilol (COREG) 6.25 MG tablet Take 6.25 mg by mouth 2 (two) times daily. 02/01/19  Yes [provider]  clopidogrel (PLAVIX) 75 MG tablet Take 1 tablet (75 mg total) by mouth daily. 04/13/19  Yes Serafina Mitchell, MD  fluticasone (FLONASE) 50 MCG/ACT nasal spray Place 1 spray into both nostrils daily as needed for allergies or rhinitis.    Yes [provider]  insulin NPH-regular Human (NOVOLIN 70/30) (70-30) 100 UNIT/ML injection Inject 10-20 Units into the skin See admin instructions. Use twice daily per sliding scale: Blood sugar less than 150=12 units & Blood sugar 200 or more=20 units   Yes [provider]  isosorbide mononitrate (IMDUR) 30 MG 24 hr tablet Take 30 mg by mouth daily after lunch.    Yes [provider]  multivitamin (RENA-VIT) TABS tablet Take 1 tablet by mouth daily after lunch.  03/31/19  Yes [provider]  predniSONE (DELTASONE) 10 MG tablet Take 10-20 mg by mouth See admin  instructions. Starting on 07/13/2019 take 20mg  for 5 days, then take 10mg  until all taken as directed.   Yes [provider]  sulfamethoxazole-trimethoprim (BACTRIM) 400-80 MG tablet Take 1 tablet by mouth daily. On dialysis days, take after your dialysis treatment. Patient not taking: Reported on 07/20/2019 05/17/19   Gabriel Earing, PA-C    Family History Family History  Problem Relation Age of Onset   Heart murmur Mother    Heart failure Mother    Diabetes Mother    Cirrhosis Father    Alcoholism Father     Social History Social History   Tobacco Use   Smoking status: Never Smoker   Smokeless tobacco: Never Used  Substance  Use Topics   Alcohol use: Never    Frequency: Never   Drug use: Never     Allergies   Patient has no known allergies.   Review of Systems Review of Systems  Constitutional: Negative for appetite change, chills and fever.  HENT: Negative for ear pain, rhinorrhea, sneezing and sore throat.   Eyes: Negative for photophobia and visual disturbance.  Respiratory: Negative for cough, chest tightness, shortness of breath and wheezing.   Cardiovascular: Negative for chest pain and palpitations.  Gastrointestinal: Negative for abdominal pain, blood in stool, constipation, diarrhea, nausea and vomiting.  Genitourinary: Negative for dysuria, hematuria and urgency.  Musculoskeletal: Positive for back pain. Negative for myalgias.  Skin: Negative for rash.  Neurological: Negative for dizziness, weakness and light-headedness.     Physical Exam Updated Vital Signs BP (!) 176/126 (BP Location: Right Arm)    Pulse (!) 101    Temp 98.2 F (36.8 C) (Oral)    Ht 5\' 7"  (1.702 m)    Wt 67.1 kg    SpO2 97%    BMI 23.18 kg/m   Physical Exam Vitals signs and nursing note reviewed.  Constitutional:      General: She is not in acute distress.    Appearance: She is well-developed.  HENT:     Head: Normocephalic and atraumatic.     Nose: Nose  normal.  Eyes:     General: No scleral icterus.       Right eye: No discharge.        Left eye: No discharge.     Conjunctiva/sclera: Conjunctivae normal.  Neck:     Musculoskeletal: Normal range of motion and neck supple.  Cardiovascular:     Rate and Rhythm: Normal rate and regular rhythm.     Heart sounds: Normal heart sounds. No murmur. No friction rub. No gallop.   Pulmonary:     Effort: Pulmonary effort is normal. No respiratory distress.     Breath sounds: Normal breath sounds.  Abdominal:     General: Bowel sounds are normal. There is no distension.     Palpations: Abdomen is soft.     Tenderness: There is no abdominal tenderness. There is no guarding.  Musculoskeletal: Normal range of motion.        General: Tenderness present.  Skin:    General: Skin is warm and dry.     Findings: No rash.  Neurological:     General: No focal deficit present.     Mental Status: She is alert and oriented to person, place, and time.     Cranial Nerves: No cranial nerve deficit.     Motor: No weakness or abnormal muscle tone.     Coordination: Coordination normal.      ED Treatments / Results  Labs (all labs ordered are listed, but only abnormal results are displayed) Labs Reviewed  COMPREHENSIVE METABOLIC PANEL - Abnormal; Notable for the following components:      Result Value   Glucose, Bld 152 (*)    BUN 42 (*)    Creatinine, Ser 6.39 (*)    Calcium 8.5 (*)    Total Protein 8.3 (*)    Albumin 3.1 (*)    GFR calc non Af Amer 6 (*)    GFR calc Af Amer 7 (*)    All other components within normal limits  CBC WITH DIFFERENTIAL/PLATELET - Abnormal; Notable for the following components:   WBC 19.1 (*)    MCHC 29.5 (*)  RDW 15.9 (*)    Neutro Abs 15.4 (*)    Monocytes Absolute 1.3 (*)    Abs Immature Granulocytes 0.14 (*)    All other components within normal limits  CULTURE, BLOOD (ROUTINE X 2)  CULTURE, BLOOD (ROUTINE X 2)  SARS CORONAVIRUS 2 (TAT 6-12 HRS)    PROTIME-INR  URINALYSIS, ROUTINE W REFLEX MICROSCOPIC    EKG None  Radiology Dg Chest 2 View  Result Date: 07/20/2019 CLINICAL DATA:  Abnormal labs, suspected sepsis. Sob. EXAM: CHEST - 2 VIEW COMPARISON:  Chest radiograph 07/19/2019, 08/18/2018 FINDINGS: Stable cardiomediastinal contours. Atherosclerotic calcification in the aortic arch. Stable position of a right central venous catheter. Possible developing infiltrate in the right lower lobe. No pneumothorax or pleural effusion. No acute abnormality in the visualized skeleton or upper abdomen. IMPRESSION: Possible developing infiltrate in the right lower lobe. Electronically Signed   By: Audie Pinto M.D.   On: 07/20/2019 13:42   Dg Chest 2 View  Result Date: 07/19/2019 CLINICAL DATA:  Sepsis EXAM: CHEST - 2 VIEW COMPARISON:  August 18, 2018 FINDINGS: There is no edema or consolidation. Heart is upper normal in size with pulmonary vascularity normal. There is aortic atherosclerosis. No adenopathy. Central catheter tip is at the cavoatrial junction. No pneumothorax. No bone lesions. IMPRESSION: No edema or consolidation. Stable cardiac silhouette. No adenopathy. Central catheter tip at cavoatrial junction. No pneumothorax. Aortic Atherosclerosis (ICD10-I70.0). Electronically Signed   By: Lowella Grip III M.D.   On: 07/19/2019 14:05   Ct Head Wo Contrast  Result Date: 07/19/2019 CLINICAL DATA:  68 year old female with altered mental status after dialysis this morning. EXAM: CT HEAD WITHOUT CONTRAST TECHNIQUE: Contiguous axial images were obtained from the base of the skull through the vertex without intravenous contrast. COMPARISON:  Brain MRI 03/03/2018.  Head CT 08/22/2010. FINDINGS: Brain: Generalized cerebral volume loss since the 2011 CT. No ventriculomegaly. No midline shift, mass effect, or evidence of intracranial mass lesion. No acute intracranial hemorrhage identified. Stable small chronic infarcts in the left cerebellum  and left thalamus. No definite cortical encephalomalacia. No cortically based acute infarct identified. Vascular: Extensive Calcified atherosclerosis at the skull base. No suspicious intracranial vascular hyperdensity. Skull: No acute osseous abnormality identified. Sinuses/Orbits: Visualized paranasal sinuses and mastoids are stable and well pneumatized. Other: Stable orbit and scalp soft tissues. IMPRESSION: No acute intracranial abnormality identified. Chronic small vessel disease appears stable since the 2019 MRI. Electronically Signed   By: Genevie Ann M.D.   On: 07/19/2019 14:04   Mr Brain Wo Contrast  Result Date: 07/19/2019 CLINICAL DATA:  68 year old female with altered mental status after dialysis this morning. Back pain on the right radiating to the hip and down the right leg since this morning. Fall several weeks ago. EXAM: MRI HEAD WITHOUT CONTRAST TECHNIQUE: Multiplanar, multiecho pulse sequences of the brain and surrounding structures were obtained without intravenous contrast. COMPARISON:  Head CT and CT Abdomen and Pelvis earlier today. [facility] Brain MRI 03/03/2018. FINDINGS: Brain: No restricted diffusion to suggest acute infarction. No midline shift, mass effect, evidence of mass lesion, ventriculomegaly, extra-axial collection or acute intracranial hemorrhage. Cervicomedullary junction and pituitary are within normal limits. Small chronic infarcts in the left cerebellum and left thalamus are stable. Widespread increased perivascular spaces are stable. Mild for age cerebral white matter T2 and FLAIR hyperintensity. No cortical encephalomalacia. No chronic blood products. Vascular: Major intracranial vascular flow voids are stable. Skull and upper cervical spine: Generalized decreased T1 marrow signal, probably related to  renal osteodystrophy in this clinical setting. Otherwise negative visible cervical spine. Sinuses/Orbits: Stable, negative. Other: Mastoids remain clear. Grossly  normal visible internal auditory structures. Scalp and face soft tissues appear negative. IMPRESSION: No acute intracranial abnormality. Stable chronic small vessel disease since 2019, primarily in the left thalamus and cerebellum. Electronically Signed   By: Genevie Ann M.D.   On: 07/19/2019 17:11   Mr Lumbar Spine Wo Contrast  Result Date: 07/19/2019 CLINICAL DATA:  68 year old female with altered mental status after dialysis this morning. Back pain on the right radiating to the hip and down the right leg since this morning. Fall several weeks ago. EXAM: MRI LUMBAR SPINE WITHOUT CONTRAST TECHNIQUE: Multiplanar, multisequence MR imaging of the lumbar spine was performed. No intravenous contrast was administered. COMPARISON:  Head CT and CT Abdomen and Pelvis earlier today. [facility] Brain MRI 03/03/2018. FINDINGS: Segmentation:  Normal on the comparison CT. Alignment: Stable straightening of lumbar lordosis as seen by CT earlier today. Vertebrae: Patchy marrow edema in both the L3 and L4 vertebral bodies. The pedicles and posterior elements appear spared. And there appear to be defects about the L3-L4 disc, probably allowing for herniation of disc material into the bodies. There is faint associated edema in the medial psoas muscles at this level (more so the right series 16, image 3), but no other regional soft tissue inflammation. Bone marrow signal is highly heterogeneous throughout the remaining visible spine and pelvis, with geographic areas of decreased T1 marrow signal throughout, but no associated marrow edema. The visible SI joints appear intact. Conus medullaris and cauda equina: Conus extends to the L1-L2 level. No lower spinal cord or conus signal abnormality. Paraspinal and other soft tissues: Stable from the earlier CT. A degree of renal atrophy is noted. Disc levels: Mild for age disc degeneration. There is disc bulging at L2-L3 through L5-S1. There is mild multifactorial spinal stenosis  at L3-L4 when combined with posterior element hypertrophy and endplate spurring. And there is similar multifactorial moderate bilateral L3 through L5 neural foraminal stenosis. IMPRESSION: 1. Abnormal L3 and L4 vertebral bodies and intervening disc as seen by CT. Although Infectious Discitis Osteomyelitis is possible, this could also be the sequelae of hemodialysis related spondyloarthropathy and/or intravertebral disc herniations (Schmorl's nodes). Blood cultures today are pending, and if negative recommend a short interval repeat MRI without contrast (e.g. 3-4 weeks) to document stability. 2. Markedly heterogeneous marrow signal throughout the visible spine and pelvis, but absent marrow edema except #1. Favor renal osteodystrophy. 3. Degenerative mild spinal stenosis at L3-L4, and moderate bilateral L3 through L5 neural foraminal stenosis. Electronically Signed   By: Genevie Ann M.D.   On: 07/19/2019 17:29   Ct Renal Stone Study  Result Date: 07/19/2019 CLINICAL DATA:  Flank pain, also recent fall EXAM: CT ABDOMEN AND PELVIS WITHOUT CONTRAST TECHNIQUE: Multidetector CT imaging of the abdomen and pelvis was performed following the standard protocol without IV contrast. COMPARISON:  None FINDINGS: Lower chest: Dependent ground-glass opacity in the lung bases likely reflective of atelectasis. Borderline cardiomegaly with trace pericardial effusion. Extensive atherosclerotic calcification of the coronary arteries. Calcification of the aortic leaflets. A dialysis catheter tip is partially visualized at the level of the right atrium. Hepatobiliary: No focal liver abnormality is seen. No gallstones, gallbladder wall thickening, or biliary dilatation. Pancreas: Unremarkable. No pancreatic ductal dilatation or surrounding inflammatory changes. Spleen: Normal in size without focal abnormality. Adrenals/Urinary Tract: Normal appearance of the adrenal glands. Both kidneys appear atrophic. A 14 13 mm  fluid attenuation cysts is  seen in the interpolar region right kidney. A smaller 5 mm hyperattenuating exophytic, potentially cystic lesion arising from the left upper pole. Similar 3 mm hyperattenuating lesion is seen in the posterior right upper pole. The smaller lesion is indeterminate on this noncontrast enhanced study. No visible urolithiasis or hydronephrosis. Mild left urothelial thickening at the renal pelvis is noted. Bladder is decompressed with circumferential wall thickening though this is somewhat greater than expected with mild perivesicular haze. Stomach/Bowel: Moderate hiatal hernia. Distal stomach and duodenum are unremarkable. No small bowel dilatation or wall thickening. There is borderline dilation up to 8 mm of a fluid-filled appendix seen in the right lower quadrant without significant periappendiceal fat stranding or pericecal inflammation. Scattered colonic diverticula without focal pericolonic inflammation to suggest diverticulitis. Vascular/Lymphatic: Extensive atherosclerotic calcification is seen of the aorta and branch vessels. Evaluation for luminal stenosis is limited in absence of contrast media. No suspicious or enlarged lymph nodes in the included lymphatic chains. Reproductive: Uterus is surgically absent. No concerning adnexal lesions. Other: No abdominopelvic free fluid or free gas. No bowel containing hernias. Postsurgical changes of the anterior abdomen. Musculoskeletal: There is diffusely includes sclerosis of the L3 and L4 vertebral bodies with multilobulated lytic appearing lesions involving centered upon the disc space which also demonstrates central hypoattenuation. Please soft tissue thickening anteriorly at this level as well. Concerning lead there is an additional lucent lesion within the left sacral ala though this could reflect a separate process as the appearance is discs similar and is likely more compatible with irregular marrow heterogeneity in the setting of dialysis dependence.  IMPRESSION: 1. Sclerosis of L3 and 4 with irregular lucencies in the end plates and hypoattenuation of the intervertebral disc space with adjacent hazy stranding. Findings are concerning for discitis/osteomyelitis. Recommend evaluation with a noncontrast MRI of the lumbar spine for further evaluation given patient's renal compromise. 2. Presence of an additional lucent lesion in the left sacral ala however the appearance is dissimilar to the aforementioned lucency within L3 and L4. Finding could reflect irregular marrow heterogeneity in the setting of dialysis dependence the malignancy is excluded. Will be better evaluated on MR as well. 3. Mild left urothelial thickening at the left renal pelvis and questionable thickening of the urinary bladder, nonspecific, but can be seen with ascending infection. Correlate with urinalysis. 4. Borderline dilation of a fluid-filled appendix up to 8 mm without significant periappendiceal fat stranding or pericecal inflammation. Most likely normal for this patient unless additional clinical findings are present to suggest appendicitis. 5. Moderate hiatal hernia. 6. Two subcentimeter, hyperattenuating, potentially cystic lesion arises from the upper pole of both kidneys. Consider further evaluation with nonemergent renal ultrasound for further evaluation. 7. Aortic Atherosclerosis (ICD10-I70.0). These results and recommendations were called by telephone at the time of interpretation on 07/19/2019 at 2:25 pm to Dr. Nanda Quinton , who verbally acknowledged these results. Electronically Signed   By: Lovena Le M.D.   On: 07/19/2019 14:26    Procedures Procedures (including critical care time)  Medications Ordered in ED Medications  ceFEPIme (MAXIPIME) 2 g in sodium chloride 0.9 % 100 mL IVPB (2 g Intravenous New Bag/Given 07/20/19 1526)     Initial Impression / Assessment and Plan / ED Course  I have reviewed the triage vital signs and the nursing notes.  Pertinent labs &  imaging results that were available during my care of the patient were reviewed by me and considered in my medical decision making (see chart  for details).  Clinical Course as of Jul 19 1537  Thu Jul 20, 2019  1428 Spoke to Dr. Megan Salon of infectious disease.  He was able to review the imaging and culture results.  Due to her history of ESRD and prior cultures, feel it is appropriate to put her on cefepime until susceptibilities result.   [HK]    Clinical Course User Index [HK] Delia Heady, PA-C       68 year old female with a past medical history of ESRD on dialysis MWF, presents to ED for positive blood cultures.  Patient seen and evaluated yesterday for altered mental status, suspicious lumbar MRI showing possible discitis osteomyelitis of L3 and L4.  Patient was told by infectious disease consult that she should wait until blood cultures result.  Blood cultures returned today showing Gram negative rods.  Patient appears overall well just complaining of back pain for the past 2 weeks.  She is hemodynamically stable, in no acute distress.  Lab work is pending but will start cefepime per infectious disease consult. Will admit to hospitalist.  Final Clinical Impressions(s) / ED Diagnoses   Final diagnoses:  Bacteremia    ED Discharge Orders    None       Delia Heady, PA-C 07/20/19 1539    Long, [facility], MD 07/20/19 2018

## 2019-07-20 NOTE — H&P (Signed)
TRH H&P    Patient Demographics:    [name], is a 68 y.o. female  MRN: [id number]  DOB - [birth year]  Admit Date - 07/20/2019  Referring MD/NP/PA:  Delia Heady  Outpatient Primary MD for the patient is Sasser, Silvestre Moment, MD [facility] - nephrology  Patient coming from:  home  Chief complaint- bacteremia   HPI:    [name]  is a 68 y.o. female, w hypertension, hyperlipidemia, Dm2 w ESRD on HD (M,W, F), h/o CVA, Jerrye Bushy, apparently presents due to being called by ER for bacteremia, (gram negative rod), on blood culture 07/19/2019.  Pt was seen yesterday for AMS. Appears wnl today.    MRI Lumbar spine 07/19/2019 IMPRESSION: 1. Abnormal L3 and L4 vertebral bodies and intervening disc as seen by CT. Although Infectious Discitis Osteomyelitis is possible, this could also be the sequelae of hemodialysis related spondyloarthropathy and/or intravertebral disc herniations (Schmorl's nodes). Blood cultures today are pending, and if negative recommend a short interval repeat MRI without contrast (e.g. 3-4 weeks) to document stability. 2. Markedly heterogeneous marrow signal throughout the visible spine and pelvis, but absent marrow edema except #1. Favor renal osteodystrophy. 3. Degenerative mild spinal stenosis at L3-L4, and moderate bilateral L3 through L5 neural foraminal stenosis.  In ED,  T 98.2, P 101, repeat pulse 100, Bp 176/126 (pt states has not taken bp medication today), Pox 97% on RA Wt 67.1 kg  Wbc 19.1, hgb 12.2, Plt 203 Na 137, K 4.9, Bun 42, Creatinine 6.39 Ast 16, Alt 16 Glucose 152 IN 1.0  Blood culture x2 pending  ESR pending  Ed spoke with ID who recommended cefepime per ED.    Pt will be admitted for bacteremia.  Gram negative rods. ? Osteomyelitis     Review of systems:    In addition to the HPI above,  No Fever-chills, No Headache, No changes with Vision or  hearing, No problems swallowing food or Liquids, No Chest pain, Cough or Shortness of Breath, No Abdominal pain, No Nausea or Vomiting, bowel movements are regular, No Blood in stool or Urine, No dysuria, No new skin rashes or bruises, No new joints pains-aches,  No new weakness, tingling, numbness in any extremity, No recent weight gain or loss, No polyuria, polydypsia or polyphagia, No significant Mental Stressors.  All other systems reviewed and are negative.    Past History of the following :    Past Medical History:  Diagnosis Date   Arthritis    hands   Constipation    Diabetes mellitus without complication ([place])    Type II   ESRD (end stage renal disease) ([place])     M/W/F- Hemodialysis   GERD (gastroesophageal reflux disease)    Headache    Hyperlipidemia    Hypertension    Irregular heart rate    Stroke ([place])    TIA - approx 2010- no residual      Past Surgical History:  Procedure Laterality Date   ABDOMINAL HYSTERECTOMY     AORTIC ARCH ANGIOGRAPHY N/A 03/28/2019  Procedure: AORTIC ARCH ANGIOGRAPHY;  Surgeon: Waynetta Sandy, MD;  Location: [place] [facility];  Service: Cardiovascular;  Laterality: N/A;   AV FISTULA PLACEMENT Left 06/13/2018   Procedure: ARTERIOVENOUS (AV) FISTULA CREATION;  Surgeon: Rosetta Posner, MD;  Location: [place];  Service: Vascular;  Laterality: Left;   AV FISTULA PLACEMENT Left 08/18/2018   Procedure: INSERTION OF 4-7MM X 45CM ARTERIOVENOUS (AV) GORE-TEX GRAFT LEFT  FOREARM;  Surgeon: Rosetta Posner, MD;  Location: [place];  Service: Vascular;  Laterality: Left;   AV FISTULA PLACEMENT Right 04/06/2019   Procedure: INSERTION OF ARTERIOVENOUS (AV) GORE-TEX GRAFT RIGHT UPPER ARM;  Surgeon: Waynetta Sandy, MD;  Location: [place];  Service: Vascular;  Laterality: Right;   AV FISTULA PLACEMENT Right 04/13/2019   Procedure: INSERTION OF ARTERIOVENOUS (AV) BOVINE  ARTEGRAFT GRAFT RIGHT UPPER EXTREMITY;  Surgeon:  Serafina Mitchell, MD;  Location: [place];  Service: Vascular;  Laterality: Right;   [place] REMOVAL Right 05/16/2019   Procedure: REMOVAL OF ARTERIOVENOUS GORETEX GRAFT ([place]) RIGHT ARM;  Surgeon: Angelia Mould, MD;  Location: [place];  Service: Vascular;  Laterality: Right;   [place] Right 03/07/2019   Procedure: First Stage Bascilic Vein Transposition Right Arm;  Surgeon: Serafina Mitchell, MD;  Location: [place];  Service: Vascular;  Laterality: Right;   CATARACT EXTRACTION Right 2005   INSERTION OF DIALYSIS CATHETER N/A 08/18/2018   Procedure: INSERTION OF DIALYSIS CATHETER;  Surgeon: Rosetta Posner, MD;  Location: [place];  Service: Vascular;  Laterality: N/A;   IR FLUORO GUIDE CV LINE RIGHT  12/06/2018   IR PTA ADDL CENTRAL DIALYSIS SEG THRU DIALY CIRCUIT RIGHT Right 12/06/2018   THROMBECTOMY AND REVISION OF ARTERIOVENTOUS (AV) GORETEX  GRAFT Left 09/29/2018   Procedure: INSERTION OF ARTERIOVENTOUS (AV) GORETEX  GRAFT ARM;  Surgeon: Waynetta Sandy, MD;  Location: [place];  Service: Vascular;  Laterality: Left;   UPPER EXTREMITY ANGIOGRAPHY Right 03/28/2019   Procedure: UPPER EXTREMITY ANGIOGRAPHY;  Surgeon: Waynetta Sandy, MD;  Location: [place] [facility];  Service: Cardiovascular;  Laterality: Right;   VENOGRAM  10/27/2018   Procedure: Venogram;  Surgeon: Marty Heck, MD;  Location: [place] [facility];  Service: Cardiovascular;;  bilateral arm      Social History:      Social History   Tobacco Use   Smoking status: Never Smoker   Smokeless tobacco: Never Used  Substance Use Topics   Alcohol use: Never    Frequency: Never       Family History :     Family History  Problem Relation Age of Onset   Heart murmur Mother    Heart failure Mother    Diabetes Mother    Cirrhosis Father    Alcoholism Father        Home Medications:   Prior to Admission medications   Medication Sig Start Date End Date Taking?  Authorizing Provider  amLODipine (NORVASC) 10 MG tablet Take 10 mg by mouth daily after lunch.    Yes [provider]  aspirin EC 81 MG tablet Take 81 mg by mouth daily after lunch.    Yes [provider]  calcium carbonate (TUMS - DOSED IN MG ELEMENTAL CALCIUM) 500 MG chewable tablet Chew 1 tablet by mouth 2 (two) times daily. With lunch & supper   Yes [provider]  carvedilol (COREG) 6.25 MG tablet Take 6.25 mg by mouth 2 (two) times daily. 02/01/19  Yes [provider]  clopidogrel (PLAVIX) 75 MG tablet Take 1 tablet (75 mg total) by mouth daily. 04/13/19  Yes Serafina Mitchell, MD  fluticasone (FLONASE) 50 MCG/ACT nasal spray Place 1 spray into both nostrils daily as needed for allergies or rhinitis.    Yes [provider]  insulin NPH-regular Human (NOVOLIN 70/30) (70-30) 100 UNIT/ML injection Inject 10-20 Units into the skin See admin instructions. Use twice daily per sliding scale: Blood sugar less than 150=12 units & Blood sugar 200 or more=20 units   Yes [provider]  isosorbide mononitrate (IMDUR) 30 MG 24 hr tablet Take 30 mg by mouth daily after lunch.    Yes [provider]  multivitamin (RENA-VIT) TABS tablet Take 1 tablet by mouth daily after lunch.  03/31/19  Yes [provider]  predniSONE (DELTASONE) 10 MG tablet Take 10-20 mg by mouth See admin instructions. Starting on 07/13/2019 take 48m for 5 days, then take 156muntil all taken as directed.   Yes [provider]  sulfamethoxazole-trimethoprim (BACTRIM) 400-80 MG tablet Take 1 tablet by mouth daily. On dialysis days, take after your dialysis treatment. Patient not taking: Reported on 07/20/2019 05/17/19   RhGabriel EaringPA-C     Allergies:    No Known Allergies   Physical Exam:   Vitals  Blood pressure (!) 158/97, pulse (!) 107, temperature 98 F (36.7 C), temperature source Oral, resp. rate 20, height '5\' 7"'  (1.702 m), weight 67.1 kg,  SpO2 99 %.  1.  General: axoxo3  2. Psychiatric: euthymic  3. Neurologic: cn2-12 intact, reflexes 2+ symmetric, diffuse with downgoing toes bilaterally, motor 5/5 in all 4 ext  4. HEENMT:  Anicteric, negative for conjuctival hemorrhage, pupils 1.40m20mymmetric, direct, consensual, near intact Neck: no jvd  5. Respiratory : CTAB  6. Cardiovascular : rrr s1, s2, no m/g/r  7. Gastrointestinal:  Abd: soft, nt, nd, +bs  8. Skin:  Ext: no c/c/e, no rash, no janeway, no osler  9.Musculoskeletal:  Good ROM,      Data Review:    CBC Recent Labs  Lab 07/19/19 1454 07/20/19 1445  WBC 22.8* 19.1*  HGB 11.5* 12.2  HCT 37.5 41.4  PLT 213 203  MCV 90.1 92.2  MCH 27.6 27.2  MCHC 30.7 29.5*  RDW 15.6* 15.9*  LYMPHSABS 0.8 2.2  MONOABS 0.9 1.3*  EOSABS 0.1 0.1  BASOSABS 0.1 0.0   ------------------------------------------------------------------------------------------------------------------  Results for orders placed or performed during the hospital encounter of 07/20/19 (from the past 48 hour(s))  Comprehensive metabolic panel     Status: Abnormal   Collection Time: 07/20/19  2:45 PM  Result Value Ref Range   Sodium 137 135 - 145 mmol/L   Potassium 4.9 3.5 - 5.1 mmol/L   Chloride 98 98 - 111 mmol/L   CO2 28 22 - 32 mmol/L   Glucose, Bld 152 (H) 70 - 99 mg/dL   BUN 42 (H) 8 - 23 mg/dL   Creatinine, Ser 6.39 (H) 0.44 - 1.00 mg/dL   Calcium 8.5 (L) 8.9 - 10.3 mg/dL   Total Protein 8.3 (H) 6.5 - 8.1 g/dL   Albumin 3.1 (L) 3.5 - 5.0 g/dL   AST 16 15 - 41 U/L   ALT 16 0 - 44 U/L   Alkaline Phosphatase 116 38 - 126 U/L   Total Bilirubin 0.5 0.3 - 1.2 mg/dL   GFR calc non Af Amer 6 (L) >60 mL/min   GFR calc Af Amer 7 (L) >60 mL/min  Anion gap 11 5 - 15    Comment: Performed at [facility], [address] [place], [place] [address]  CBC with Differential     Status: Abnormal   Collection Time: 07/20/19  2:45 PM  Result Value Ref Range   WBC 19.1 (H) 4.0 - 10.5  K/uL   RBC 4.49 3.87 - 5.11 MIL/uL   Hemoglobin 12.2 12.0 - 15.0 g/dL   HCT 41.4 36.0 - 46.0 %   MCV 92.2 80.0 - 100.0 fL   MCH 27.2 26.0 - 34.0 pg   MCHC 29.5 (L) 30.0 - 36.0 g/dL   RDW 15.9 (H) 11.5 - 15.5 %   Platelets 203 150 - 400 K/uL   nRBC 0.0 0.0 - 0.2 %   Neutrophils Relative % 81 %   Neutro Abs 15.4 (H) 1.7 - 7.7 K/uL   Lymphocytes Relative 11 %   Lymphs Abs 2.2 0.7 - 4.0 K/uL   Monocytes Relative 7 %   Monocytes Absolute 1.3 (H) 0.1 - 1.0 K/uL   Eosinophils Relative 0 %   Eosinophils Absolute 0.1 0.0 - 0.5 K/uL   Basophils Relative 0 %   Basophils Absolute 0.0 0.0 - 0.1 K/uL   Immature Granulocytes 1 %   Abs Immature Granulocytes 0.14 (H) 0.00 - 0.07 K/uL    Comment: Performed at [facility], [address], [place], [place] [address]  Protime-INR     Status: None   Collection Time: 07/20/19  2:45 PM  Result Value Ref Range   Prothrombin Time 13.4 11.4 - 15.2 seconds   INR 1.0 0.8 - 1.2    Comment: (NOTE) INR goal varies based on device and disease states. Performed at [facility], [address] [place], [place] [address]   Culture, blood (Routine x 2)     Status: None (Preliminary result)   Collection Time: 07/20/19  2:45 PM   Specimen: BLOOD LEFT HAND  Result Value Ref Range   Specimen Description BLOOD LEFT HAND    Special Requests      BOTTLES DRAWN AEROBIC ONLY Blood Culture adequate volume Performed at [facility], [address] [place], [place] [address]    Culture PENDING    Report Status PENDING   Culture, blood (Routine x 2)     Status: None (Preliminary result)   Collection Time: 07/20/19  2:45 PM   Specimen: BLOOD RIGHT HAND  Result Value Ref Range   Specimen Description BLOOD RIGHT HAND    Special Requests      BOTTLES DRAWN AEROBIC ONLY Blood Culture adequate volume Performed at [facility], [address], [place],  [address]    Culture PENDING    Report Status PENDING     Chemistries  Recent Labs  Lab 07/19/19 1336  07/20/19 1445  NA 136 137  K 4.3 4.9  CL 100 98  CO2 25 28  GLUCOSE 145* 152*  BUN 28* 42*  CREATININE 4.44* 6.39*  CALCIUM 8.1* 8.5*  MG 1.6*  --   AST 31 16  ALT 20 16  ALKPHOS 104 116  BILITOT 0.6 0.5   ------------------------------------------------------------------------------------------------------------------  ------------------------------------------------------------------------------------------------------------------ GFR: Estimated Creatinine Clearance: 8.2 mL/min (A) (by C-G formula based on SCr of 6.39 mg/dL (H)). Liver Function Tests: Recent Labs  Lab 07/19/19 1336 07/20/19 1445  AST 31 16  ALT 20 16  ALKPHOS 104 116  BILITOT 0.6 0.5  PROT 7.4 8.3*  ALBUMIN 2.9* 3.1*   No results for input(s): LIPASE, AMYLASE in the last 168  hours. No results for input(s): AMMONIA in the last 168 hours. Coagulation Profile: Recent Labs  Lab 07/19/19 1336 07/20/19 1445  INR 1.1 1.0   Cardiac Enzymes: No results for input(s): CKTOTAL, CKMB, CKMBINDEX, TROPONINI in the last 168 hours. BNP (last 3 results) No results for input(s): PROBNP in the last 8760 hours. HbA1C: No results for input(s): HGBA1C in the last 72 hours. CBG: Recent Labs  Lab 07/19/19 1941  GLUCAP 148*   Lipid Profile: No results for input(s): CHOL, HDL, LDLCALC, TRIG, CHOLHDL, LDLDIRECT in the last 72 hours. Thyroid Function Tests: Recent Labs    07/19/19 1336  TSH 1.028   Anemia Panel: No results for input(s): VITAMINB12, FOLATE, FERRITIN, TIBC, IRON, RETICCTPCT in the last 72 hours.  --------------------------------------------------------------------------------------------------------------- Urine analysis:    Component Value Date/Time   COLORURINE YELLOW 07/19/2019 1417   APPEARANCEUR CLOUDY (A) 07/19/2019 1417   LABSPEC 1.027 07/19/2019 1417   PHURINE 5.0 07/19/2019 1417   GLUCOSEU NEGATIVE 07/19/2019 1417   HGBUR SMALL (A) 07/19/2019 1417   BILIRUBINUR NEGATIVE  07/19/2019 1417   KETONESUR NEGATIVE 07/19/2019 1417   PROTEINUR 100 (A) 07/19/2019 1417   NITRITE NEGATIVE 07/19/2019 1417   LEUKOCYTESUR NEGATIVE 07/19/2019 1417      Imaging Results:    Dg Chest 2 View  Result Date: 07/20/2019 CLINICAL DATA:  Abnormal labs, suspected sepsis. Sob. EXAM: CHEST - 2 VIEW COMPARISON:  Chest radiograph 07/19/2019, 08/18/2018 FINDINGS: Stable cardiomediastinal contours. Atherosclerotic calcification in the aortic arch. Stable position of a right central venous catheter. Possible developing infiltrate in the right lower lobe. No pneumothorax or pleural effusion. No acute abnormality in the visualized skeleton or upper abdomen. IMPRESSION: Possible developing infiltrate in the right lower lobe. Electronically Signed   By: Audie Pinto M.D.   On: 07/20/2019 13:42   Dg Chest 2 View  Result Date: 07/19/2019 CLINICAL DATA:  Sepsis EXAM: CHEST - 2 VIEW COMPARISON:  August 18, 2018 FINDINGS: There is no edema or consolidation. Heart is upper normal in size with pulmonary vascularity normal. There is aortic atherosclerosis. No adenopathy. Central catheter tip is at the cavoatrial junction. No pneumothorax. No bone lesions. IMPRESSION: No edema or consolidation. Stable cardiac silhouette. No adenopathy. Central catheter tip at cavoatrial junction. No pneumothorax. Aortic Atherosclerosis (ICD10-I70.0). Electronically Signed   By: Lowella Grip III M.D.   On: 07/19/2019 14:05   Ct Head Wo Contrast  Result Date: 07/19/2019 CLINICAL DATA:  68 year old female with altered mental status after dialysis this morning. EXAM: CT HEAD WITHOUT CONTRAST TECHNIQUE: Contiguous axial images were obtained from the base of the skull through the vertex without intravenous contrast. COMPARISON:  Brain MRI 03/03/2018.  Head CT 08/22/2010. FINDINGS: Brain: Generalized cerebral volume loss since the 2011 CT. No ventriculomegaly. No midline shift, mass effect, or evidence of intracranial  mass lesion. No acute intracranial hemorrhage identified. Stable small chronic infarcts in the left cerebellum and left thalamus. No definite cortical encephalomalacia. No cortically based acute infarct identified. Vascular: Extensive Calcified atherosclerosis at the skull base. No suspicious intracranial vascular hyperdensity. Skull: No acute osseous abnormality identified. Sinuses/Orbits: Visualized paranasal sinuses and mastoids are stable and well pneumatized. Other: Stable orbit and scalp soft tissues. IMPRESSION: No acute intracranial abnormality identified. Chronic small vessel disease appears stable since the 2019 MRI. Electronically Signed   By: Genevie Ann M.D.   On: 07/19/2019 14:04   Mr Brain Wo Contrast  Result Date: 07/19/2019 CLINICAL DATA:  68 year old female with altered mental status after dialysis this morning.  Back pain on the right radiating to the hip and down the right leg since this morning. Fall several weeks ago. EXAM: MRI HEAD WITHOUT CONTRAST TECHNIQUE: Multiplanar, multiecho pulse sequences of the brain and surrounding structures were obtained without intravenous contrast. COMPARISON:  Head CT and CT Abdomen and Pelvis earlier today. [facility] Brain MRI 03/03/2018. FINDINGS: Brain: No restricted diffusion to suggest acute infarction. No midline shift, mass effect, evidence of mass lesion, ventriculomegaly, extra-axial collection or acute intracranial hemorrhage. Cervicomedullary junction and pituitary are within normal limits. Small chronic infarcts in the left cerebellum and left thalamus are stable. Widespread increased perivascular spaces are stable. Mild for age cerebral white matter T2 and FLAIR hyperintensity. No cortical encephalomalacia. No chronic blood products. Vascular: Major intracranial vascular flow voids are stable. Skull and upper cervical spine: Generalized decreased T1 marrow signal, probably related to renal osteodystrophy in this clinical setting.  Otherwise negative visible cervical spine. Sinuses/Orbits: Stable, negative. Other: Mastoids remain clear. Grossly normal visible internal auditory structures. Scalp and face soft tissues appear negative. IMPRESSION: No acute intracranial abnormality. Stable chronic small vessel disease since 2019, primarily in the left thalamus and cerebellum. Electronically Signed   By: Genevie Ann M.D.   On: 07/19/2019 17:11   Mr Lumbar Spine Wo Contrast  Result Date: 07/19/2019 CLINICAL DATA:  68 year old female with altered mental status after dialysis this morning. Back pain on the right radiating to the hip and down the right leg since this morning. Fall several weeks ago. EXAM: MRI LUMBAR SPINE WITHOUT CONTRAST TECHNIQUE: Multiplanar, multisequence MR imaging of the lumbar spine was performed. No intravenous contrast was administered. COMPARISON:  Head CT and CT Abdomen and Pelvis earlier today. [facility] Brain MRI 03/03/2018. FINDINGS: Segmentation:  Normal on the comparison CT. Alignment: Stable straightening of lumbar lordosis as seen by CT earlier today. Vertebrae: Patchy marrow edema in both the L3 and L4 vertebral bodies. The pedicles and posterior elements appear spared. And there appear to be defects about the L3-L4 disc, probably allowing for herniation of disc material into the bodies. There is faint associated edema in the medial psoas muscles at this level (more so the right series 16, image 3), but no other regional soft tissue inflammation. Bone marrow signal is highly heterogeneous throughout the remaining visible spine and pelvis, with geographic areas of decreased T1 marrow signal throughout, but no associated marrow edema. The visible SI joints appear intact. Conus medullaris and cauda equina: Conus extends to the L1-L2 level. No lower spinal cord or conus signal abnormality. Paraspinal and other soft tissues: Stable from the earlier CT. A degree of renal atrophy is noted. Disc levels: Mild  for age disc degeneration. There is disc bulging at L2-L3 through L5-S1. There is mild multifactorial spinal stenosis at L3-L4 when combined with posterior element hypertrophy and endplate spurring. And there is similar multifactorial moderate bilateral L3 through L5 neural foraminal stenosis. IMPRESSION: 1. Abnormal L3 and L4 vertebral bodies and intervening disc as seen by CT. Although Infectious Discitis Osteomyelitis is possible, this could also be the sequelae of hemodialysis related spondyloarthropathy and/or intravertebral disc herniations (Schmorl's nodes). Blood cultures today are pending, and if negative recommend a short interval repeat MRI without contrast (e.g. 3-4 weeks) to document stability. 2. Markedly heterogeneous marrow signal throughout the visible spine and pelvis, but absent marrow edema except #1. Favor renal osteodystrophy. 3. Degenerative mild spinal stenosis at L3-L4, and moderate bilateral L3 through L5 neural foraminal stenosis. Electronically Signed   By: Lemmie Evens  [place] Crane M.D.   On: 07/19/2019 17:29   Ct Renal Stone Study  Result Date: 07/19/2019 CLINICAL DATA:  Flank pain, also recent fall EXAM: CT ABDOMEN AND PELVIS WITHOUT CONTRAST TECHNIQUE: Multidetector CT imaging of the abdomen and pelvis was performed following the standard protocol without IV contrast. COMPARISON:  None FINDINGS: Lower chest: Dependent ground-glass opacity in the lung bases likely reflective of atelectasis. Borderline cardiomegaly with trace pericardial effusion. Extensive atherosclerotic calcification of the coronary arteries. Calcification of the aortic leaflets. A dialysis catheter tip is partially visualized at the level of the right atrium. Hepatobiliary: No focal liver abnormality is seen. No gallstones, gallbladder wall thickening, or biliary dilatation. Pancreas: Unremarkable. No pancreatic ductal dilatation or surrounding inflammatory changes. Spleen: Normal in size without focal abnormality.  Adrenals/Urinary Tract: Normal appearance of the adrenal glands. Both kidneys appear atrophic. A 14 13 mm fluid attenuation cysts is seen in the interpolar region right kidney. A smaller 5 mm hyperattenuating exophytic, potentially cystic lesion arising from the left upper pole. Similar 3 mm hyperattenuating lesion is seen in the posterior right upper pole. The smaller lesion is indeterminate on this noncontrast enhanced study. No visible urolithiasis or hydronephrosis. Mild left urothelial thickening at the renal pelvis is noted. Bladder is decompressed with circumferential wall thickening though this is somewhat greater than expected with mild perivesicular haze. Stomach/Bowel: Moderate hiatal hernia. Distal stomach and duodenum are unremarkable. No small bowel dilatation or wall thickening. There is borderline dilation up to 8 mm of a fluid-filled appendix seen in the right lower quadrant without significant periappendiceal fat stranding or pericecal inflammation. Scattered colonic diverticula without focal pericolonic inflammation to suggest diverticulitis. Vascular/Lymphatic: Extensive atherosclerotic calcification is seen of the aorta and branch vessels. Evaluation for luminal stenosis is limited in absence of contrast media. No suspicious or enlarged lymph nodes in the included lymphatic chains. Reproductive: Uterus is surgically absent. No concerning adnexal lesions. Other: No abdominopelvic free fluid or free gas. No bowel containing hernias. Postsurgical changes of the anterior abdomen. Musculoskeletal: There is diffusely includes sclerosis of the L3 and L4 vertebral bodies with multilobulated lytic appearing lesions involving centered upon the disc space which also demonstrates central hypoattenuation. Please soft tissue thickening anteriorly at this level as well. Concerning lead there is an additional lucent lesion within the left sacral ala though this could reflect a separate process as the appearance  is discs similar and is likely more compatible with irregular marrow heterogeneity in the setting of dialysis dependence. IMPRESSION: 1. Sclerosis of L3 and 4 with irregular lucencies in the end plates and hypoattenuation of the intervertebral disc space with adjacent hazy stranding. Findings are concerning for discitis/osteomyelitis. Recommend evaluation with a noncontrast MRI of the lumbar spine for further evaluation given patient's renal compromise. 2. Presence of an additional lucent lesion in the left sacral ala however the appearance is dissimilar to the aforementioned lucency within L3 and L4. Finding could reflect irregular marrow heterogeneity in the setting of dialysis dependence the malignancy is excluded. Will be better evaluated on MR as well. 3. Mild left urothelial thickening at the left renal pelvis and questionable thickening of the urinary bladder, nonspecific, but can be seen with ascending infection. Correlate with urinalysis. 4. Borderline dilation of a fluid-filled appendix up to 8 mm without significant periappendiceal fat stranding or pericecal inflammation. Most likely normal for this patient unless additional clinical findings are present to suggest appendicitis. 5. Moderate hiatal hernia. 6. Two subcentimeter, hyperattenuating, potentially cystic lesion arises from the upper pole of  both kidneys. Consider further evaluation with nonemergent renal ultrasound for further evaluation. 7. Aortic Atherosclerosis (ICD10-I70.0). These results and recommendations were called by telephone at the time of interpretation on 07/19/2019 at 2:25 pm to Dr. Nanda Quinton , who verbally acknowledged these results. Electronically Signed   By: Lovena Le M.D.   On: 07/19/2019 14:26      Assessment & Plan:    Principal Problem:   Bacteremia Active Problems:   ESRD on dialysis [facility])   Essential hypertension   Type 2 diabetes mellitus with diabetic nephropathy (HCC)  Bacteremia (gram negative rods)   ? osteomyelitis L spine Blood culture x2 ESR Cefepime pharmacy to dose  Tachycardia Check TSH Check cardiac echo  Hypertension  Cont Norvasc 84m po qday Cont Carvedilol 6.268mpo bid Cont Imdur 3035mo qday  Dm2 fsbs ac and qhs, ISS  H/o stroke Cont Aspirin Cont Plavix Check lipid, unclear why not on statin  ESRD on HD (M, W, F) Cont Renavit Nephrology consult placed in computer  DVT Prophylaxis-   Heparin, SCDs   AM Labs Ordered, also please review Full Orders  Family Communication: Admission, patients condition and plan of care including tests being ordered have been discussed with the patient who indicate understanding and agree with the plan and Code Status.  Code Status:  FULL CODE, notified [facility]   Admission status: Inpatient: Based on patients clinical presentation and evaluation of above clinical data, I have made determination that patient meets Inpatient criteria at this time.  Pt has bacteremia, and possibly osteomyelitis of the L1-3 vertebra, pt will require culture results to tailor abx, pt will require prolonged abx treatment,  Pt will require > 2 nites stay and inpatient status  Time spent in minutes :  70   JamJani GravelD on 07/20/2019 at 5:34 PM

## 2019-07-21 ENCOUNTER — Inpatient Hospital Stay (HOSPITAL_COMMUNITY): Payer: Medicare PPO

## 2019-07-21 LAB — COMPREHENSIVE METABOLIC PANEL
ALT: 16 U/L (ref 0–44)
AST: 25 U/L (ref 15–41)
Albumin: 3.1 g/dL — ABNORMAL LOW (ref 3.5–5.0)
Alkaline Phosphatase: 116 U/L (ref 38–126)
Anion gap: 17 — ABNORMAL HIGH (ref 5–15)
BUN: 49 mg/dL — ABNORMAL HIGH (ref 8–23)
CO2: 20 mmol/L — ABNORMAL LOW (ref 22–32)
Calcium: 8.7 mg/dL — ABNORMAL LOW (ref 8.9–10.3)
Chloride: 101 mmol/L (ref 98–111)
Creatinine, Ser: 7 mg/dL — ABNORMAL HIGH (ref 0.44–1.00)
GFR calc Af Amer: 6 mL/min — ABNORMAL LOW (ref >60)
GFR calc non Af Amer: 6 mL/min — ABNORMAL LOW (ref >60)
Glucose, Bld: 144 mg/dL — ABNORMAL HIGH (ref 70–99)
Potassium: 5.6 mmol/L — ABNORMAL HIGH (ref 3.5–5.1)
Sodium: 138 mmol/L (ref 135–145)
Total Bilirubin: 0.6 mg/dL (ref 0.3–1.2)
Total Protein: 8 g/dL (ref 6.5–8.1)

## 2019-07-21 LAB — GLUCOSE, CAPILLARY
Glucose-Capillary: 136 mg/dL — ABNORMAL HIGH (ref 70–99)
Glucose-Capillary: 180 mg/dL — ABNORMAL HIGH (ref 70–99)
Glucose-Capillary: 176 mg/dL — ABNORMAL HIGH (ref 70–99)
Glucose-Capillary: 118 mg/dL — ABNORMAL HIGH (ref 70–99)

## 2019-07-21 LAB — CBC
HCT: 44.2 % (ref 36.0–46.0)
Hemoglobin: 13.5 g/dL (ref 12.0–15.0)
MCH: 27.2 pg (ref 26.0–34.0)
MCHC: 30.5 g/dL (ref 30.0–36.0)
MCV: 88.9 fL (ref 80.0–100.0)
Platelets: 230 K/uL (ref 150–400)
RBC: 4.97 MIL/uL (ref 3.87–5.11)
RDW: 15.5 % (ref 11.5–15.5)
WBC: 17.4 K/uL — ABNORMAL HIGH (ref 4.0–10.5)
nRBC: 0 % (ref 0.0–0.2)

## 2019-07-21 LAB — HIV ANTIBODY (ROUTINE TESTING W REFLEX): HIV Screen 4th Generation wRfx: NONREACTIVE

## 2019-07-21 MED ORDER — PROMETHAZINE HCL 25 MG/ML IJ SOLN
12.5000 mg | Freq: Four times a day (QID) | INTRAMUSCULAR | Status: DC | PRN
  Administered 2019-07-21: 18:00:00 12.5 mg via INTRAVENOUS
  Filled 2019-07-21: qty 1

## 2019-07-21 MED ORDER — CHLORHEXIDINE GLUCONATE CLOTH 2 % EX PADS
6.0000 | MEDICATED_PAD | Freq: Every day | CUTANEOUS | Status: DC
  Administered 2019-07-21 – 2019-07-26 (×5): 6 via TOPICAL

## 2019-07-21 MED ORDER — MORPHINE SULFATE (PF) 2 MG/ML IV SOLN
2.0000 mg | Freq: Once | INTRAVENOUS | Status: AC
  Administered 2019-07-21: 2 mg via INTRAVENOUS
  Filled 2019-07-21: qty 1

## 2019-07-21 MED ORDER — ALUM & MAG HYDROXIDE-SIMETH 200-200-20 MG/5ML PO SUSP
30.0000 mL | ORAL | Status: DC | PRN
  Administered 2019-07-21: 02:00:00 30 mL via ORAL
  Filled 2019-07-21: qty 30

## 2019-07-21 MED ORDER — ONDANSETRON HCL 4 MG/2ML IJ SOLN
4.0000 mg | Freq: Four times a day (QID) | INTRAMUSCULAR | Status: DC | PRN
  Administered 2019-07-21: 09:00:00 4 mg via INTRAVENOUS
  Filled 2019-07-21: qty 2

## 2019-07-21 MED ORDER — HYDROMORPHONE HCL 1 MG/ML IJ SOLN
0.5000 mg | INTRAMUSCULAR | Status: DC | PRN
  Administered 2019-07-21 – 2019-07-26 (×5): 0.5 mg via INTRAVENOUS
  Filled 2019-07-21 (×5): qty 0.5

## 2019-07-21 MED ORDER — LABETALOL HCL 5 MG/ML IV SOLN
10.0000 mg | Freq: Once | INTRAVENOUS | Status: AC
  Administered 2019-07-21: 09:00:00 10 mg via INTRAVENOUS
  Filled 2019-07-21: qty 4

## 2019-07-21 NOTE — Consult Note (Signed)
Reason for Consult: ESRD Referring Physician:  Dr. Jani Gravel  Chief Complaint: Bacteremia  Assessment/Plan: 1. ESRD - Plan on HD today and then will resume MWF regimen; she is on as the 3rd pt to be HD today. She usually dialyzes at [facility] with a EDW listed as 65.5kg and the last treatment was on Wed where she left at 66kg. Usually receives Heparin 1400 unit bolus and then 700/hr with RIJ TC (very long tunnel tract). Should get this changed out with bacteremia; best case scenario is to  Remove, leave cath free for a few days  and then place a new catheter in a different site. Will refer to VIR for the new catheter vs exchange depending on what sites she has for a tunneled catheter. 2. Renal osteodystrophy - can resume binders when she's eating but likely not anytime soon; she's nauseous and overall not feeling well. 3. Anemia - at goal. 4. Discitis with osteomyelitis + pseudomonas bacteremia - on Cefepime pending sensitivities.  Dr. Joelyn Oms and Dr. Justin Mend are oncall this weekend; HD orders written for Monday. Please call with any questions o/w pt will be seen again on Monday.    HPI: [name] is an 68 y.o. female ESRD with recent visit to the ED 2 days ago for AMS with lower back pain radiating to her right leg. MRI spine at that time showed abnormal L3 and L4 vertebral bodies and intervening disc as seen by CT appeared to be problematic at which time diagnosis of infectious discitis osteomyelitis was entertained vs sequelae of hemodialysis related spondyloarthropathy and/or intravertebral disc herniations (Schmorl's nodes). Blood cultures were sent and grew GNR with ID rec of  a short interval repeat MRI without contrast (e.g. 3-4 weeks) to document stability + initiating Cefepime. Blood cultures drawn on 8/26 grew out Pseudomonas Aeruginosa and pt admitted for treatment + evaluation.   Review of Systems  All other systems reviewed and are negative.  Pertinent items are noted in  HPI.  Chemistry and CBC: Creatinine, Ser  Date/Time Value Ref Range Status  07/21/2019 08:19 AM 7.00 (H) 0.44 - 1.00 mg/dL Final  07/20/2019 02:45 PM 6.39 (H) 0.44 - 1.00 mg/dL Final  07/19/2019 01:36 PM 4.44 (H) 0.44 - 1.00 mg/dL Final  05/17/2019 04:40 AM 5.81 (H) 0.44 - 1.00 mg/dL Final  04/06/2019 08:04 AM 5.45 (H) 0.44 - 1.00 mg/dL Final  10/27/2018 06:19 AM 3.70 (H) 0.44 - 1.00 mg/dL Final   Recent Labs  Lab 07/19/19 1336 07/20/19 1445 07/21/19 0819  NA 136 137 138  K 4.3 4.9 5.6*  CL 100 98 101  CO2 25 28 20*  GLUCOSE 145* 152* 144*  BUN 28* 42* 49*  CREATININE 4.44* 6.39* 7.00*  CALCIUM 8.1* 8.5* 8.7*   Recent Labs  Lab 07/19/19 1454 07/20/19 1445 07/21/19 0819  WBC 22.8* 19.1* 17.4*  NEUTROABS 20.7* 15.4*  --   HGB 11.5* 12.2 13.5  HCT 37.5 41.4 44.2  MCV 90.1 92.2 88.9  PLT 213 203 230   Liver Function Tests: Recent Labs  Lab 07/19/19 1336 07/20/19 1445 07/21/19 0819  AST 31 16 25   ALT 20 16 16   ALKPHOS 104 116 116  BILITOT 0.6 0.5 0.6  PROT 7.4 8.3* 8.0  ALBUMIN 2.9* 3.1* 3.1*   No results for input(s): LIPASE, AMYLASE in the last 168 hours. No results for input(s): AMMONIA in the last 168 hours. Cardiac Enzymes: No results for input(s): CKTOTAL, CKMB, CKMBINDEX, TROPONINI in the last 168 hours. Iron Studies:  No results for input(s): IRON, TIBC, TRANSFERRIN, FERRITIN in the last 72 hours. PT/INR: @LABRCNTIP (inr:5)  Xrays/Other Studies: ) Results for orders placed or performed during the hospital encounter of 07/20/19 (from the past 48 hour(s))  Comprehensive metabolic panel     Status: Abnormal   Collection Time: 07/20/19  2:45 PM  Result Value Ref Range   Sodium 137 135 - 145 mmol/L   Potassium 4.9 3.5 - 5.1 mmol/L   Chloride 98 98 - 111 mmol/L   CO2 28 22 - 32 mmol/L   Glucose, Bld 152 (H) 70 - 99 mg/dL   BUN 42 (H) 8 - 23 mg/dL   Creatinine, Ser 6.39 (H) 0.44 - 1.00 mg/dL   Calcium 8.5 (L) 8.9 - 10.3 mg/dL   Total Protein 8.3 (H)  6.5 - 8.1 g/dL   Albumin 3.1 (L) 3.5 - 5.0 g/dL   AST 16 15 - 41 U/L   ALT 16 0 - 44 U/L   Alkaline Phosphatase 116 38 - 126 U/L   Total Bilirubin 0.5 0.3 - 1.2 mg/dL   GFR calc non Af Amer 6 (L) >60 mL/min   GFR calc Af Amer 7 (L) >60 mL/min   Anion gap 11 5 - 15    Comment: Performed at [facility], [address] [place], [place] [address]  CBC with Differential     Status: Abnormal   Collection Time: 07/20/19  2:45 PM  Result Value Ref Range   WBC 19.1 (H) 4.0 - 10.5 K/uL   RBC 4.49 3.87 - 5.11 MIL/uL   Hemoglobin 12.2 12.0 - 15.0 g/dL   HCT 41.4 36.0 - 46.0 %   MCV 92.2 80.0 - 100.0 fL   MCH 27.2 26.0 - 34.0 pg   MCHC 29.5 (L) 30.0 - 36.0 g/dL   RDW 15.9 (H) 11.5 - 15.5 %   Platelets 203 150 - 400 K/uL   nRBC 0.0 0.0 - 0.2 %   Neutrophils Relative % 81 %   Neutro Abs 15.4 (H) 1.7 - 7.7 K/uL   Lymphocytes Relative 11 %   Lymphs Abs 2.2 0.7 - 4.0 K/uL   Monocytes Relative 7 %   Monocytes Absolute 1.3 (H) 0.1 - 1.0 K/uL   Eosinophils Relative 0 %   Eosinophils Absolute 0.1 0.0 - 0.5 K/uL   Basophils Relative 0 %   Basophils Absolute 0.0 0.0 - 0.1 K/uL   Immature Granulocytes 1 %   Abs Immature Granulocytes 0.14 (H) 0.00 - 0.07 K/uL    Comment: Performed at [facility], [address], [place], [place] [address]  Protime-INR     Status: None   Collection Time: 07/20/19  2:45 PM  Result Value Ref Range   Prothrombin Time 13.4 11.4 - 15.2 seconds   INR 1.0 0.8 - 1.2    Comment: (NOTE) INR goal varies based on device and disease states. Performed at [facility], [address] [place], [place] [address]   Culture, blood (Routine x 2)     Status: None (Preliminary result)   Collection Time: 07/20/19  2:45 PM   Specimen: BLOOD LEFT HAND  Result Value Ref Range   Specimen Description BLOOD LEFT HAND    Special Requests      BOTTLES DRAWN AEROBIC ONLY Blood Culture adequate volume   Culture      NO GROWTH < 24 HOURS Performed at [facility], [address]  [place],  [address]    Report Status PENDING   Culture, blood (Routine  x 2)     Status: None (Preliminary result)   Collection Time: 07/20/19  2:45 PM   Specimen: BLOOD RIGHT HAND  Result Value Ref Range   Specimen Description BLOOD RIGHT HAND    Special Requests      BOTTLES DRAWN AEROBIC ONLY Blood Culture adequate volume   Culture      NO GROWTH < 24 HOURS Performed at [facility], [address] [place],  [address]    Report Status PENDING   HIV antibody (Routine Testing)     Status: None   Collection Time: 07/20/19  2:45 PM  Result Value Ref Range   HIV Screen 4th Generation wRfx Non Reactive Non Reactive    Comment: (NOTE) Performed At: [facility] [address] [place], [place] [id number] Rush Farmer MD [id number]   SARS Coronavirus 2 [facility] order, Performed in [facility] hospital lab) Nasopharyngeal Nasopharyngeal Swab     Status: None   Collection Time: 07/20/19  5:11 PM   Specimen: Nasopharyngeal Swab  Result Value Ref Range   SARS Coronavirus 2 NEGATIVE NEGATIVE    Comment: (NOTE) If result is NEGATIVE SARS-CoV-2 target nucleic acids are NOT DETECTED. The SARS-CoV-2 RNA is generally detectable in upper and lower  respiratory specimens during the acute phase of infection. The lowest  concentration of SARS-CoV-2 viral copies this assay can detect is 250  copies / mL. A negative result does not preclude SARS-CoV-2 infection  and should not be used as the sole basis for treatment or other  patient management decisions.  A negative result may occur with  improper specimen collection / handling, submission of specimen other  than nasopharyngeal swab, presence of viral mutation(s) within the  areas targeted by this assay, and inadequate number of viral copies  (<250 copies / mL). A negative result must be combined with clinical  observations, patient history, and epidemiological information. If result is POSITIVE SARS-CoV-2 target nucleic  acids are DETECTED. The SARS-CoV-2 RNA is generally detectable in upper and lower  respiratory specimens dur ing the acute phase of infection.  Positive  results are indicative of active infection with SARS-CoV-2.  Clinical  correlation with patient history and other diagnostic information is  necessary to determine patient infection status.  Positive results do  not rule out bacterial infection or co-infection with other viruses. If result is PRESUMPTIVE POSTIVE SARS-CoV-2 nucleic acids MAY BE PRESENT.   A presumptive positive result was obtained on the submitted specimen  and confirmed on repeat testing.  While 2019 novel coronavirus  (SARS-CoV-2) nucleic acids may be present in the submitted sample  additional confirmatory testing may be necessary for epidemiological  and / or clinical management purposes  to differentiate between  SARS-CoV-2 and other Sarbecovirus currently known to infect humans.  If clinically indicated additional testing with an alternate test  methodology [phone]) is advised. The SARS-CoV-2 RNA is generally  detectable in upper and lower respiratory sp ecimens during the acute  phase of infection. The expected result is Negative. Fact Sheet for Patients:  StrictlyIdeas.no Fact Sheet for Healthcare Providers: BankingDealers.co.za This test is not yet approved or cleared by the [place] FDA and has been authorized for detection and/or diagnosis of SARS-CoV-2 by FDA under an Emergency Use Authorization (EUA).  This EUA will remain in effect (meaning this test can be used) for the duration of the COVID-19 declaration under Section 564(b)(1) of the Act, 21 U.S.C. section 360bbb-3(b)(1), unless the authorization is terminated or revoked sooner. Performed at  [facility], [address], [place], [place] [address]   Sedimentation rate     Status: Abnormal   Collection Time: 07/20/19  6:01 PM  Result Value Ref  Range   Sed Rate 75 (H) 0 - 22 mm/hr    Comment: Performed at [facility], [address] [place], [place] [address]  Glucose, capillary     Status: Abnormal   Collection Time: 07/20/19  6:12 PM  Result Value Ref Range   Glucose-Capillary 107 (H) 70 - 99 mg/dL  Glucose, capillary     Status: Abnormal   Collection Time: 07/20/19  8:41 PM  Result Value Ref Range   Glucose-Capillary 142 (H) 70 - 99 mg/dL   Comment 1 Notify RN    Comment 2 Document in Chart   Urinalysis, Routine w reflex microscopic     Status: Abnormal   Collection Time: 07/20/19  9:00 PM  Result Value Ref Range   Color, Urine YELLOW YELLOW   APPearance CLEAR CLEAR   Specific Gravity, Urine 1.012 1.005 - 1.030   pH 8.0 5.0 - 8.0   Glucose, UA NEGATIVE NEGATIVE mg/dL   Hgb urine dipstick SMALL (A) NEGATIVE   Bilirubin Urine NEGATIVE NEGATIVE   Ketones, ur NEGATIVE NEGATIVE mg/dL   Protein, ur >=300 (A) NEGATIVE mg/dL   Nitrite NEGATIVE NEGATIVE   Leukocytes,Ua NEGATIVE NEGATIVE   RBC / HPF 11-20 0 - 5 RBC/hpf   WBC, UA 0-5 0 - 5 WBC/hpf   Bacteria, UA RARE (A) NONE SEEN   Squamous Epithelial / LPF 0-5 0 - 5   Mucus PRESENT     Comment: Performed at [facility], [address], [place], [place] [address]  Glucose, capillary     Status: Abnormal   Collection Time: 07/21/19  7:24 AM  Result Value Ref Range   Glucose-Capillary 136 (H) 70 - 99 mg/dL  Comprehensive metabolic panel     Status: Abnormal   Collection Time: 07/21/19  8:19 AM  Result Value Ref Range   Sodium 138 135 - 145 mmol/L   Potassium 5.6 (H) 3.5 - 5.1 mmol/L   Chloride 101 98 - 111 mmol/L   CO2 20 (L) 22 - 32 mmol/L   Glucose, Bld 144 (H) 70 - 99 mg/dL   BUN 49 (H) 8 - 23 mg/dL   Creatinine, Ser 7.00 (H) 0.44 - 1.00 mg/dL   Calcium 8.7 (L) 8.9 - 10.3 mg/dL   Total Protein 8.0 6.5 - 8.1 g/dL   Albumin 3.1 (L) 3.5 - 5.0 g/dL   AST 25 15 - 41 U/L   ALT 16 0 - 44 U/L   Alkaline Phosphatase 116 38 - 126 U/L   Total Bilirubin 0.6 0.3 - 1.2  mg/dL   GFR calc non Af Amer 6 (L) >60 mL/min   GFR calc Af Amer 6 (L) >60 mL/min   Anion gap 17 (H) 5 - 15    Comment: Performed at [facility], [address], [place],  [address]  CBC     Status: Abnormal   Collection Time: 07/21/19  8:19 AM  Result Value Ref Range   WBC 17.4 (H) 4.0 - 10.5 K/uL   RBC 4.97 3.87 - 5.11 MIL/uL   Hemoglobin 13.5 12.0 - 15.0 g/dL   HCT 44.2 36.0 - 46.0 %   MCV 88.9 80.0 - 100.0 fL   MCH 27.2 26.0 - 34.0 pg   MCHC 30.5 30.0 - 36.0 g/dL   RDW 15.5 11.5 - 15.5 %  Platelets 230 150 - 400 K/uL   nRBC 0.0 0.0 - 0.2 %    Comment: Performed at [facility], [address], [place], [place] [address]   Dg Chest 2 View  Result Date: 07/20/2019 CLINICAL DATA:  Abnormal labs, suspected sepsis. Sob. EXAM: CHEST - 2 VIEW COMPARISON:  Chest radiograph 07/19/2019, 08/18/2018 FINDINGS: Stable cardiomediastinal contours. Atherosclerotic calcification in the aortic arch. Stable position of a right central venous catheter. Possible developing infiltrate in the right lower lobe. No pneumothorax or pleural effusion. No acute abnormality in the visualized skeleton or upper abdomen. IMPRESSION: Possible developing infiltrate in the right lower lobe. Electronically Signed   By: Audie Pinto M.D.   On: 07/20/2019 13:42   Dg Chest 2 View  Result Date: 07/19/2019 CLINICAL DATA:  Sepsis EXAM: CHEST - 2 VIEW COMPARISON:  August 18, 2018 FINDINGS: There is no edema or consolidation. Heart is upper normal in size with pulmonary vascularity normal. There is aortic atherosclerosis. No adenopathy. Central catheter tip is at the cavoatrial junction. No pneumothorax. No bone lesions. IMPRESSION: No edema or consolidation. Stable cardiac silhouette. No adenopathy. Central catheter tip at cavoatrial junction. No pneumothorax. Aortic Atherosclerosis (ICD10-I70.0). Electronically Signed   By: Lowella Grip III M.D.   On: 07/19/2019 14:05   Ct Head Wo Contrast  Result Date:  07/19/2019 CLINICAL DATA:  68 year old female with altered mental status after dialysis this morning. EXAM: CT HEAD WITHOUT CONTRAST TECHNIQUE: Contiguous axial images were obtained from the base of the skull through the vertex without intravenous contrast. COMPARISON:  Brain MRI 03/03/2018.  Head CT 08/22/2010. FINDINGS: Brain: Generalized cerebral volume loss since the 2011 CT. No ventriculomegaly. No midline shift, mass effect, or evidence of intracranial mass lesion. No acute intracranial hemorrhage identified. Stable small chronic infarcts in the left cerebellum and left thalamus. No definite cortical encephalomalacia. No cortically based acute infarct identified. Vascular: Extensive Calcified atherosclerosis at the skull base. No suspicious intracranial vascular hyperdensity. Skull: No acute osseous abnormality identified. Sinuses/Orbits: Visualized paranasal sinuses and mastoids are stable and well pneumatized. Other: Stable orbit and scalp soft tissues. IMPRESSION: No acute intracranial abnormality identified. Chronic small vessel disease appears stable since the 2019 MRI. Electronically Signed   By: Genevie Ann M.D.   On: 07/19/2019 14:04   Mr Brain Wo Contrast  Result Date: 07/19/2019 CLINICAL DATA:  68 year old female with altered mental status after dialysis this morning. Back pain on the right radiating to the hip and down the right leg since this morning. Fall several weeks ago. EXAM: MRI HEAD WITHOUT CONTRAST TECHNIQUE: Multiplanar, multiecho pulse sequences of the brain and surrounding structures were obtained without intravenous contrast. COMPARISON:  Head CT and CT Abdomen and Pelvis earlier today. [facility] Brain MRI 03/03/2018. FINDINGS: Brain: No restricted diffusion to suggest acute infarction. No midline shift, mass effect, evidence of mass lesion, ventriculomegaly, extra-axial collection or acute intracranial hemorrhage. Cervicomedullary junction and pituitary are within normal  limits. Small chronic infarcts in the left cerebellum and left thalamus are stable. Widespread increased perivascular spaces are stable. Mild for age cerebral white matter T2 and FLAIR hyperintensity. No cortical encephalomalacia. No chronic blood products. Vascular: Major intracranial vascular flow voids are stable. Skull and upper cervical spine: Generalized decreased T1 marrow signal, probably related to renal osteodystrophy in this clinical setting. Otherwise negative visible cervical spine. Sinuses/Orbits: Stable, negative. Other: Mastoids remain clear. Grossly normal visible internal auditory structures. Scalp and face soft tissues appear negative. IMPRESSION: No acute intracranial abnormality. Stable chronic  small vessel disease since 2019, primarily in the left thalamus and cerebellum. Electronically Signed   By: Genevie Ann M.D.   On: 07/19/2019 17:11   Mr Lumbar Spine Wo Contrast  Result Date: 07/19/2019 CLINICAL DATA:  68 year old female with altered mental status after dialysis this morning. Back pain on the right radiating to the hip and down the right leg since this morning. Fall several weeks ago. EXAM: MRI LUMBAR SPINE WITHOUT CONTRAST TECHNIQUE: Multiplanar, multisequence MR imaging of the lumbar spine was performed. No intravenous contrast was administered. COMPARISON:  Head CT and CT Abdomen and Pelvis earlier today. [facility] Brain MRI 03/03/2018. FINDINGS: Segmentation:  Normal on the comparison CT. Alignment: Stable straightening of lumbar lordosis as seen by CT earlier today. Vertebrae: Patchy marrow edema in both the L3 and L4 vertebral bodies. The pedicles and posterior elements appear spared. And there appear to be defects about the L3-L4 disc, probably allowing for herniation of disc material into the bodies. There is faint associated edema in the medial psoas muscles at this level (more so the right series 16, image 3), but no other regional soft tissue inflammation. Bone  marrow signal is highly heterogeneous throughout the remaining visible spine and pelvis, with geographic areas of decreased T1 marrow signal throughout, but no associated marrow edema. The visible SI joints appear intact. Conus medullaris and cauda equina: Conus extends to the L1-L2 level. No lower spinal cord or conus signal abnormality. Paraspinal and other soft tissues: Stable from the earlier CT. A degree of renal atrophy is noted. Disc levels: Mild for age disc degeneration. There is disc bulging at L2-L3 through L5-S1. There is mild multifactorial spinal stenosis at L3-L4 when combined with posterior element hypertrophy and endplate spurring. And there is similar multifactorial moderate bilateral L3 through L5 neural foraminal stenosis. IMPRESSION: 1. Abnormal L3 and L4 vertebral bodies and intervening disc as seen by CT. Although Infectious Discitis Osteomyelitis is possible, this could also be the sequelae of hemodialysis related spondyloarthropathy and/or intravertebral disc herniations (Schmorl's nodes). Blood cultures today are pending, and if negative recommend a short interval repeat MRI without contrast (e.g. 3-4 weeks) to document stability. 2. Markedly heterogeneous marrow signal throughout the visible spine and pelvis, but absent marrow edema except #1. Favor renal osteodystrophy. 3. Degenerative mild spinal stenosis at L3-L4, and moderate bilateral L3 through L5 neural foraminal stenosis. Electronically Signed   By: Genevie Ann M.D.   On: 07/19/2019 17:29   Ct Renal Stone Study  Result Date: 07/19/2019 CLINICAL DATA:  Flank pain, also recent fall EXAM: CT ABDOMEN AND PELVIS WITHOUT CONTRAST TECHNIQUE: Multidetector CT imaging of the abdomen and pelvis was performed following the standard protocol without IV contrast. COMPARISON:  None FINDINGS: Lower chest: Dependent ground-glass opacity in the lung bases likely reflective of atelectasis. Borderline cardiomegaly with trace pericardial effusion.  Extensive atherosclerotic calcification of the coronary arteries. Calcification of the aortic leaflets. A dialysis catheter tip is partially visualized at the level of the right atrium. Hepatobiliary: No focal liver abnormality is seen. No gallstones, gallbladder wall thickening, or biliary dilatation. Pancreas: Unremarkable. No pancreatic ductal dilatation or surrounding inflammatory changes. Spleen: Normal in size without focal abnormality. Adrenals/Urinary Tract: Normal appearance of the adrenal glands. Both kidneys appear atrophic. A 14 13 mm fluid attenuation cysts is seen in the interpolar region right kidney. A smaller 5 mm hyperattenuating exophytic, potentially cystic lesion arising from the left upper pole. Similar 3 mm hyperattenuating lesion is seen in the posterior right upper  pole. The smaller lesion is indeterminate on this noncontrast enhanced study. No visible urolithiasis or hydronephrosis. Mild left urothelial thickening at the renal pelvis is noted. Bladder is decompressed with circumferential wall thickening though this is somewhat greater than expected with mild perivesicular haze. Stomach/Bowel: Moderate hiatal hernia. Distal stomach and duodenum are unremarkable. No small bowel dilatation or wall thickening. There is borderline dilation up to 8 mm of a fluid-filled appendix seen in the right lower quadrant without significant periappendiceal fat stranding or pericecal inflammation. Scattered colonic diverticula without focal pericolonic inflammation to suggest diverticulitis. Vascular/Lymphatic: Extensive atherosclerotic calcification is seen of the aorta and branch vessels. Evaluation for luminal stenosis is limited in absence of contrast media. No suspicious or enlarged lymph nodes in the included lymphatic chains. Reproductive: Uterus is surgically absent. No concerning adnexal lesions. Other: No abdominopelvic free fluid or free gas. No bowel containing hernias. Postsurgical changes of  the anterior abdomen. Musculoskeletal: There is diffusely includes sclerosis of the L3 and L4 vertebral bodies with multilobulated lytic appearing lesions involving centered upon the disc space which also demonstrates central hypoattenuation. Please soft tissue thickening anteriorly at this level as well. Concerning lead there is an additional lucent lesion within the left sacral ala though this could reflect a separate process as the appearance is discs similar and is likely more compatible with irregular marrow heterogeneity in the setting of dialysis dependence. IMPRESSION: 1. Sclerosis of L3 and 4 with irregular lucencies in the end plates and hypoattenuation of the intervertebral disc space with adjacent hazy stranding. Findings are concerning for discitis/osteomyelitis. Recommend evaluation with a noncontrast MRI of the lumbar spine for further evaluation given patient's renal compromise. 2. Presence of an additional lucent lesion in the left sacral ala however the appearance is dissimilar to the aforementioned lucency within L3 and L4. Finding could reflect irregular marrow heterogeneity in the setting of dialysis dependence the malignancy is excluded. Will be better evaluated on MR as well. 3. Mild left urothelial thickening at the left renal pelvis and questionable thickening of the urinary bladder, nonspecific, but can be seen with ascending infection. Correlate with urinalysis. 4. Borderline dilation of a fluid-filled appendix up to 8 mm without significant periappendiceal fat stranding or pericecal inflammation. Most likely normal for this patient unless additional clinical findings are present to suggest appendicitis. 5. Moderate hiatal hernia. 6. Two subcentimeter, hyperattenuating, potentially cystic lesion arises from the upper pole of both kidneys. Consider further evaluation with nonemergent renal ultrasound for further evaluation. 7. Aortic Atherosclerosis (ICD10-I70.0). These results and  recommendations were called by telephone at the time of interpretation on 07/19/2019 at 2:25 pm to Dr. Nanda Quinton , who verbally acknowledged these results. Electronically Signed   By: Lovena Le M.D.   On: 07/19/2019 14:26    PMH:   Past Medical History:  Diagnosis Date  . Arthritis    hands  . Constipation   . Diabetes mellitus without complication (HCC)    Type II  . ESRD (end stage renal disease) ([place])     M/W/F- Hemodialysis  . GERD (gastroesophageal reflux disease)   . Headache   . Hyperlipidemia   . Hypertension   . Irregular heart rate   . Stroke [facility])    TIA - approx 2010- no residual    PSH:   Past Surgical History:  Procedure Laterality Date  . ABDOMINAL HYSTERECTOMY    . AORTIC ARCH ANGIOGRAPHY N/A 03/28/2019   Procedure: AORTIC ARCH ANGIOGRAPHY;  Surgeon: Waynetta Sandy, MD;  Location:  [place] [facility];  Service: Cardiovascular;  Laterality: N/A;  . AV FISTULA PLACEMENT Left 06/13/2018   Procedure: ARTERIOVENOUS (AV) FISTULA CREATION;  Surgeon: Rosetta Posner, MD;  Location: [place];  Service: Vascular;  Laterality: Left;  . AV FISTULA PLACEMENT Left 08/18/2018   Procedure: INSERTION OF 4-7MM X 45CM ARTERIOVENOUS (AV) GORE-TEX GRAFT LEFT  FOREARM;  Surgeon: Rosetta Posner, MD;  Location: [place];  Service: Vascular;  Laterality: Left;  . AV FISTULA PLACEMENT Right 04/06/2019   Procedure: INSERTION OF ARTERIOVENOUS (AV) GORE-TEX GRAFT RIGHT UPPER ARM;  Surgeon: Waynetta Sandy, MD;  Location: [place];  Service: Vascular;  Laterality: Right;  . AV FISTULA PLACEMENT Right 04/13/2019   Procedure: INSERTION OF ARTERIOVENOUS (AV) BOVINE  ARTEGRAFT GRAFT RIGHT UPPER EXTREMITY;  Surgeon: Serafina Mitchell, MD;  Location: [place];  Service: Vascular;  Laterality: Right;  . [place] REMOVAL Right 05/16/2019   Procedure: REMOVAL OF ARTERIOVENOUS GORETEX GRAFT ([place]) RIGHT ARM;  Surgeon: Angelia Mould, MD;  Location: [place];  Service: Vascular;  Laterality: Right;  .  BASCILIC VEIN TRANSPOSITION Right 03/07/2019   Procedure: First Stage Bascilic Vein Transposition Right Arm;  Surgeon: Serafina Mitchell, MD;  Location: [place];  Service: Vascular;  Laterality: Right;  . CATARACT EXTRACTION Right 2005  . INSERTION OF DIALYSIS CATHETER N/A 08/18/2018   Procedure: INSERTION OF DIALYSIS CATHETER;  Surgeon: Rosetta Posner, MD;  Location: [place];  Service: Vascular;  Laterality: N/A;  . IR FLUORO GUIDE CV LINE RIGHT  12/06/2018  . IR PTA ADDL CENTRAL DIALYSIS SEG THRU DIALY CIRCUIT RIGHT Right 12/06/2018  . THROMBECTOMY AND REVISION OF ARTERIOVENTOUS (AV) GORETEX  GRAFT Left 09/29/2018   Procedure: INSERTION OF ARTERIOVENTOUS (AV) GORETEX  GRAFT ARM;  Surgeon: Waynetta Sandy, MD;  Location: [place];  Service: Vascular;  Laterality: Left;  . UPPER EXTREMITY ANGIOGRAPHY Right 03/28/2019   Procedure: UPPER EXTREMITY ANGIOGRAPHY;  Surgeon: Waynetta Sandy, MD;  Location: [place] [facility];  Service: Cardiovascular;  Laterality: Right;  . VENOGRAM  10/27/2018   Procedure: Venogram;  Surgeon: Marty Heck, MD;  Location: [place] [facility];  Service: Cardiovascular;;  bilateral arm    Allergies: No Known Allergies  Medications:   Prior to Admission medications   Medication Sig Start Date End Date Taking? Authorizing Provider  amLODipine (NORVASC) 10 MG tablet Take 10 mg by mouth daily after lunch.    Yes [provider]  aspirin EC 81 MG tablet Take 81 mg by mouth daily after lunch.    Yes [provider]  calcium carbonate (TUMS - DOSED IN MG ELEMENTAL CALCIUM) 500 MG chewable tablet Chew 1 tablet by mouth 2 (two) times daily. With lunch & supper   Yes [provider]  carvedilol (COREG) 6.25 MG tablet Take 6.25 mg by mouth 2 (two) times daily. 02/01/19  Yes [provider]  clopidogrel (PLAVIX) 75 MG tablet Take 1 tablet (75 mg total) by mouth daily. 04/13/19  Yes Serafina Mitchell, MD  fluticasone (FLONASE) 50 MCG/ACT  nasal spray Place 1 spray into both nostrils daily as needed for allergies or rhinitis.    Yes [provider]  insulin NPH-regular Human (NOVOLIN 70/30) (70-30) 100 UNIT/ML injection Inject 10-20 Units into the skin See admin instructions. Use twice daily per sliding scale: Blood sugar less than 150=12 units & Blood sugar 200 or more=20 units   Yes [provider]  isosorbide mononitrate (IMDUR) 30 MG 24 hr  tablet Take 30 mg by mouth daily after lunch.    Yes [provider]  multivitamin (RENA-VIT) TABS tablet Take 1 tablet by mouth daily after lunch.  03/31/19  Yes [provider]  predniSONE (DELTASONE) 10 MG tablet Take 10-20 mg by mouth See admin instructions. Starting on 07/13/2019 take 20mg  for 5 days, then take 10mg  until all taken as directed.   Yes [provider]  sulfamethoxazole-trimethoprim (BACTRIM) 400-80 MG tablet Take 1 tablet by mouth daily. On dialysis days, take after your dialysis treatment. Patient not taking: Reported on 07/20/2019 05/17/19   Gabriel Earing, PA-C    Discontinued Meds:   Medications Discontinued During This Encounter  Medication Reason  . ceFEPIme (MAXIPIME) 1 g in sodium chloride 0.9 % 100 mL IVPB   . oxyCODONE-acetaminophen (PERCOCET/ROXICET) 5-325 MG tablet Completed Course  . feeding supplement (NEPRO CARB STEADY) liquid 237 mL     Social History:  reports that she has never smoked. She has never used smokeless tobacco. She reports that she does not drink alcohol or use drugs.  Family History:   Family History  Problem Relation Age of Onset  . Heart murmur Mother   . Heart failure Mother   . Diabetes Mother   . Cirrhosis Father   . Alcoholism Father     Blood pressure 135/89, pulse 93, temperature 98.2 F (36.8 C), temperature source Oral, resp. rate 18, height 5\' 7"  (1.702 m), weight 64.3 kg, SpO2 100 %. General appearance: alert and appears stated age Head: Normocephalic, without obvious  abnormality, atraumatic Eyes: negative Neck: no adenopathy, no carotid bruit, supple, symmetrical, trachea midline and thyroid not enlarged, symmetric, no tenderness/mass/nodules Back: Tender in lumbar spine Resp: clear to auscultation bilaterally Chest wall: no tenderness Cardio: Tachy GI: soft, non-tender; bowel sounds normal; no masses,  no organomegaly Extremities: edema None Pulses: 2+ and symmetric Skin: Skin color, texture, turgor normal. No rashes or lesions Access: RIJ TC with very long tunnel       Gissele Narducci, Hunt Oris, MD 07/21/2019, 11:10 AM

## 2019-07-21 NOTE — Procedures (Signed)
    NEPHROLOGY NURSING NOTE:  Per Dr. Pearson Grippe, hemodialysis treatment rescheduled for 07/22/19 due to high patient census on 07/21/19.   Rockwell Alexandria, RN

## 2019-07-21 NOTE — Progress Notes (Signed)
Checked patient twice this morning, constant drive heaves and vomiting. 3rd attempt patient was not vomiting, started test patient started vomiting. Will try again tomorrow, spoke with nurse.

## 2019-07-21 NOTE — Plan of Care (Signed)

## 2019-07-21 NOTE — Progress Notes (Signed)
High blood pressure this AM.  PRN Hydralazine given.  Will notify the on coming nurse so the pressure can be rechecked.

## 2019-07-21 NOTE — Progress Notes (Signed)
PROGRESS NOTE    Nikesha Kwasny  ZOX:[id number] DOB: [birth year] DOA: 07/20/2019 PCP: Manon Hilding, MD   Brief Narrative:  Per HPI: Dorthey Depace  is a 68 y.o. female, w hypertension, hyperlipidemia, Dm2 w ESRD on HD (M,W, F), h/o CVA, Jerrye Bushy, apparently presents due to being called by ER for bacteremia, (gram negative rod), on blood culture 07/19/2019.  Pt was seen yesterday for AMS. Appears wnl today.    8/28: Patient was admitted with gram-negative rod bacteremia which now is confirmed to be Pseudomonas bacteremia with concern for infectious discitis/osteomyelitis at L3/L4.  She has been started on cefepime with sensitivities still pending.  Nephrology has evaluated patient with plans for hemodialysis later today.  She was struggling with hypertension earlier this morning that improved with use of labetalol and continues to have some ongoing nausea with poor response to Zofran.  Patient will likely require new catheter placement versus exchange soon.  Assessment & Plan:   Principal Problem:   Bacteremia Active Problems:   ESRD on dialysis [facility])   Essential hypertension   Type 2 diabetes mellitus with diabetic nephropathy (HCC)   Pseudomonas bacteremia with discitis/osteomyelitis of L3/L4 -Sensitivities currently pending -Continue empirically on cefepime and consult further with ID once sensitivities returned -ESR elevation noted  Persistent nausea/vomiting -Zofran ordered as needed -We will add Phenergan to assist with improvement in symptoms  Tachycardia-improved -TSH 1.028 -2D echocardiogram pending  Severe hypertension -Continue on Norvasc, carvedilol, and Imdur -Given labetalol earlier with improvement -Hydralazine ordered as needed  Type 2 diabetes -SSI  History of prior CVA -Continue aspirin and Plavix -Lipid panel pending, uncertain why patient is not on statin  ESRD on HD M/W/F with hyperkalemia -Continue Rena-Vite -Appreciate nephrology evaluation  with plans for hemodialysis later today -Recheck BMP in a.m.   DVT prophylaxis: Heparin Code Status: Full Family Communication: None at bedside Disposition Plan: Continue on IV antibiotics and plan for hemodialysis today.  Will need catheter exchange soon and potentially require transfer for monitoring by ID as well as replacement/exchange of catheter.  Potentially may transfer to [facility] tomorrow.   Consultants:   Nephrology  Procedures:   None  Antimicrobials:  Anti-infectives (From admission, onward)   Start     Dose/Rate Route Frequency Ordered Stop   07/21/19 1600  ceFEPIme (MAXIPIME) 2 g in sodium chloride 0.9 % 100 mL IVPB     2 g 200 mL/hr over 30 Minutes Intravenous Once per day on Mon Wed Fri 07/20/19 1618     07/20/19 1500  ceFEPIme (MAXIPIME) 1 g in sodium chloride 0.9 % 100 mL IVPB  Status:  Discontinued     1 g 200 mL/hr over 30 Minutes Intravenous Every 24 hours 07/20/19 1430 07/20/19 1432   07/20/19 1500  ceFEPIme (MAXIPIME) 2 g in sodium chloride 0.9 % 100 mL IVPB     2 g 200 mL/hr over 30 Minutes Intravenous  Once 07/20/19 1432 07/20/19 1659       Subjective: Patient seen and evaluated today with ongoing dry heaving and nausea noted.  She is complaining of low back pain as well and has significant hypertension.  Objective: Vitals:   07/20/19 2217 07/21/19 0339 07/21/19 0629 07/21/19 0855  BP: (!) 142/83  (!) 192/111 135/89  Pulse: (!) 104  (!) 105 93  Resp:   18   Temp:   98.2 F (36.8 C)   TempSrc:   Oral   SpO2: 98%  100%   Weight:  64.3 kg  Height:        Intake/Output Summary (Last 24 hours) at 07/21/2019 1312 Last data filed at 07/21/2019 0906 Gross per 24 hour  Intake 240 ml  Output 1 ml  Net 239 ml   Filed Weights   07/20/19 1312 07/21/19 0339  Weight: 67.1 kg 64.3 kg    Examination:  General exam: Appears anxious and agitated, complaining of back pain and nausea Respiratory system: Clear to auscultation. Respiratory effort  normal. Cardiovascular system: S1 & S2 heard, RRR. No JVD, murmurs, rubs, gallops or clicks. No pedal edema. Gastrointestinal system: Abdomen is nondistended, soft and nontender. No organomegaly or masses felt. Normal bowel sounds heard. Central nervous system: Alert and awake Extremities: No significant edema Skin: No rashes, lesions or ulcers Psychiatry: Patient is distressed    Data Reviewed: I have personally reviewed following labs and imaging studies  CBC: Recent Labs  Lab 07/19/19 1454 07/20/19 1445 07/21/19 0819  WBC 22.8* 19.1* 17.4*  NEUTROABS 20.7* 15.4*  --   HGB 11.5* 12.2 13.5  HCT 37.5 41.4 44.2  MCV 90.1 92.2 88.9  PLT 213 203 884   Basic Metabolic Panel: Recent Labs  Lab 07/19/19 1336 07/20/19 1445 07/21/19 0819  NA 136 137 138  K 4.3 4.9 5.6*  CL 100 98 101  CO2 25 28 20*  GLUCOSE 145* 152* 144*  BUN 28* 42* 49*  CREATININE 4.44* 6.39* 7.00*  CALCIUM 8.1* 8.5* 8.7*  MG 1.6*  --   --    GFR: Estimated Creatinine Clearance: 7.5 mL/min (A) (by C-G formula based on SCr of 7 mg/dL (H)). Liver Function Tests: Recent Labs  Lab 07/19/19 1336 07/20/19 1445 07/21/19 0819  AST _0 ALT _1 ALKPHOS 104 116 116  BILITOT 0.6 0.5 0.6  PROT 7.4 8.3* 8.0  ALBUMIN 2.9* 3.1* 3.1*   No results for input(s): LIPASE, AMYLASE in the last 168 hours. No results for input(s): AMMONIA in the last 168 hours. Coagulation Profile: Recent Labs  Lab 07/19/19 1336 07/20/19 1445  INR 1.1 1.0   Cardiac Enzymes: No results for input(s): CKTOTAL, CKMB, CKMBINDEX, TROPONINI in the last 168 hours. BNP (last 3 results) No results for input(s): PROBNP in the last 8760 hours. HbA1C: No results for input(s): HGBA1C in the last 72 hours. CBG: Recent Labs  Lab 07/19/19 1941 07/20/19 1812 07/20/19 2041 07/21/19 0724 07/21/19 1116  GLUCAP 148* 107* 142* 136* 180*   Lipid Profile: No results for input(s): CHOL, HDL, LDLCALC, TRIG, CHOLHDL, LDLDIRECT in  the last 72 hours. Thyroid Function Tests: Recent Labs    07/19/19 1336  TSH 1.028   Anemia Panel: No results for input(s): VITAMINB12, FOLATE, FERRITIN, TIBC, IRON, RETICCTPCT in the last 72 hours. Sepsis Labs: Recent Labs  Lab 07/19/19 1336  LATICACIDVEN 1.2    Recent Results (from the past 240 hour(s))  Urine culture     Status: Abnormal   Collection Time: 07/19/19 12:19 PM   Specimen: Urine, Catheterized  Result Value Ref Range Status   Specimen Description   Final    URINE, CATHETERIZED Performed at [facility], [address] [place], [place] [address]    Special Requests   Final    Normal Performed at [facility], [address] [place], [place] [address]    Culture (A)  Final    <10,000 COLONIES/mL INSIGNIFICANT GROWTH Performed at [place] [address] [place], [place] [address]    Report Status 07/20/2019 FINAL  Final  Culture, blood (Routine x 2)     Status: Abnormal (Preliminary result)   Collection Time: 07/19/19  1:36 PM   Specimen: BLOOD RIGHT ARM  Result Value Ref Range Status   Specimen Description   Final    BLOOD RIGHT ARM Performed at [facility], [address], [place], [place] [address]    Special Requests   Final    BOTTLES DRAWN AEROBIC ONLY Blood Culture adequate volume Performed at [facility], [address] [place], [place] [address]    Culture  Setup Time   Final    GRAM NEGATIVE RODS AEROBIC BOTTLE ONLY Gram Stain Report Called to,Read Back By and Verified With: HARTLEY,A_0  @ 1100 BY MATTHEWS, B 8.27.2020 [facility] Organism ID to follow CRITICAL RESULT CALLED TO, READ BACK BY AND VERIFIED WITH: Marissa Nestle RN 07/20/19 1905    Culture (A)  Final    PSEUDOMONAS AERUGINOSA SUSCEPTIBILITIES TO FOLLOW Performed at [facility], [place] [address], [place], [place] [address]    Report Status PENDING  Incomplete  Culture, blood (Routine x 2)     Status: None (Preliminary result)   Collection Time: 07/19/19   1:36 PM   Specimen: BLOOD RIGHT HAND  Result Value Ref Range Status   Specimen Description   Final    BLOOD RIGHT HAND Performed at [facility], [address], [place], [place] [address]    Special Requests   Final    AEROBIC BOTTLE ONLY Blood Culture adequate volume Performed at [facility], [address] [place], [place] [address]    Culture  Setup Time   Final    AEROBIC BOTTLE ONLY GRAM NEGATIVE RODS CRITICAL VALUE NOTED.  VALUE IS CONSISTENT WITH PREVIOUSLY REPORTED AND CALLED VALUE. Performed at [facility], [place] [address], [place],  [address]    Culture GRAM NEGATIVE RODS  Final   Report Status PENDING  Incomplete  Blood Culture ID Panel (Reflexed)     Status: Abnormal   Collection Time: 07/19/19  1:36 PM  Result Value Ref Range Status   Enterococcus species NOT DETECTED NOT DETECTED Final   Listeria monocytogenes NOT DETECTED NOT DETECTED Final   Staphylococcus species NOT DETECTED NOT DETECTED Final   Staphylococcus aureus (BCID) NOT DETECTED NOT DETECTED Final   Streptococcus species NOT DETECTED NOT DETECTED Final   Streptococcus agalactiae NOT DETECTED NOT DETECTED Final   Streptococcus pneumoniae NOT DETECTED NOT DETECTED Final   Streptococcus pyogenes NOT DETECTED NOT DETECTED Final   Acinetobacter baumannii NOT DETECTED NOT DETECTED Final   Enterobacteriaceae species NOT DETECTED NOT DETECTED Final   Enterobacter cloacae complex NOT DETECTED NOT DETECTED Final   Escherichia coli NOT DETECTED NOT DETECTED Final   Klebsiella oxytoca NOT DETECTED NOT DETECTED Final   Klebsiella pneumoniae NOT DETECTED NOT DETECTED Final   Proteus species NOT DETECTED NOT DETECTED Final   Serratia marcescens NOT DETECTED NOT DETECTED Final   Carbapenem resistance NOT DETECTED NOT DETECTED Final   Haemophilus influenzae NOT DETECTED NOT DETECTED Final   Neisseria meningitidis NOT DETECTED NOT DETECTED Final   Pseudomonas aeruginosa DETECTED (A) NOT DETECTED Final     Comment: CRITICAL RESULT CALLED TO, READ BACK BY AND VERIFIED WITH: Marissa Nestle RN 07/20/19 1905 JDW    Candida albicans NOT DETECTED NOT DETECTED Final   Candida glabrata NOT DETECTED NOT DETECTED Final   Candida krusei NOT DETECTED NOT DETECTED Final   Candida parapsilosis NOT DETECTED NOT DETECTED Final  Candida tropicalis NOT DETECTED NOT DETECTED Final    Comment: Performed at [facility], [place] [address], [place], [place] [address]  Culture, blood (Routine x 2)     Status: None (Preliminary result)   Collection Time: 07/20/19  2:45 PM   Specimen: BLOOD LEFT HAND  Result Value Ref Range Status   Specimen Description BLOOD LEFT HAND  Final   Special Requests   Final    BOTTLES DRAWN AEROBIC ONLY Blood Culture adequate volume   Culture   Final    NO GROWTH < 24 HOURS Performed at [facility] [address] [place], [place] [address]    Report Status PENDING  Incomplete  Culture, blood (Routine x 2)     Status: None (Preliminary result)   Collection Time: 07/20/19  2:45 PM   Specimen: BLOOD RIGHT HAND  Result Value Ref Range Status   Specimen Description BLOOD RIGHT HAND  Final   Special Requests   Final    BOTTLES DRAWN AEROBIC ONLY Blood Culture adequate volume   Culture   Final    NO GROWTH < 24 HOURS Performed at [facility], [address] [place], [place] [address]    Report Status PENDING  Incomplete  SARS Coronavirus 2 [facility] order, Performed in [place] hospital lab) Nasopharyngeal Nasopharyngeal Swab     Status: None   Collection Time: 07/20/19  5:11 PM   Specimen: Nasopharyngeal Swab  Result Value Ref Range Status   SARS Coronavirus 2 NEGATIVE NEGATIVE Final    Comment: (NOTE) If result is NEGATIVE SARS-CoV-2 target nucleic acids are NOT DETECTED. The SARS-CoV-2 RNA is generally detectable in upper and lower  respiratory specimens during the acute phase of infection. The lowest  concentration of SARS-CoV-2 viral copies this assay can detect is 250   copies / mL. A negative result does not preclude SARS-CoV-2 infection  and should not be used as the sole basis for treatment or other  patient management decisions.  A negative result may occur with  improper specimen collection / handling, submission of specimen other  than nasopharyngeal swab, presence of viral mutation(s) within the  areas targeted by this assay, and inadequate number of viral copies  (<250 copies / mL). A negative result must be combined with clinical  observations, patient history, and epidemiological information. If result is POSITIVE SARS-CoV-2 target nucleic acids are DETECTED. The SARS-CoV-2 RNA is generally detectable in upper and lower  respiratory specimens dur ing the acute phase of infection.  Positive  results are indicative of active infection with SARS-CoV-2.  Clinical  correlation with patient history and other diagnostic information is  necessary to determine patient infection status.  Positive results do  not rule out bacterial infection or co-infection with other viruses. If result is PRESUMPTIVE POSTIVE SARS-CoV-2 nucleic acids MAY BE PRESENT.   A presumptive positive result was obtained on the submitted specimen  and confirmed on repeat testing.  While 2019 novel coronavirus  (SARS-CoV-2) nucleic acids may be present in the submitted sample  additional confirmatory testing may be necessary for epidemiological  and / or clinical management purposes  to differentiate between  SARS-CoV-2 and other Sarbecovirus currently known to infect humans.  If clinically indicated additional testing with an alternate test  methodology [phone]) is advised. The SARS-CoV-2 RNA is generally  detectable in upper and lower respiratory sp ecimens during the acute  phase of infection. The expected result is Negative. Fact Sheet for Patients:  StrictlyIdeas.no Fact Sheet for Healthcare  Providers: BankingDealers.co.za This test is not yet approved or cleared by the [place] and has been authorized for detection and/or diagnosis of SARS-CoV-2 by FDA under an Emergency Use Authorization (EUA).  This EUA will remain in effect (meaning this test can be used) for the duration of the COVID-19 declaration under Section 564(b)(1) of the Act, 21 U.S.C. section 360bbb-3(b)(1), unless the authorization is terminated or revoked sooner. Performed at [facility], [address], [place], [place] [address]          Radiology Studies: Dg Chest 2 View  Result Date: 07/20/2019 CLINICAL DATA:  Abnormal labs, suspected sepsis. Sob. EXAM: CHEST - 2 VIEW COMPARISON:  Chest radiograph 07/19/2019, 08/18/2018 FINDINGS: Stable cardiomediastinal contours. Atherosclerotic calcification in the aortic arch. Stable position of a right central venous catheter. Possible developing infiltrate in the right lower lobe. No pneumothorax or pleural effusion. No acute abnormality in the visualized skeleton or upper abdomen. IMPRESSION: Possible developing infiltrate in the right lower lobe. Electronically Signed   By: Audie Pinto M.D.   On: 07/20/2019 13:42   Dg Chest 2 View  Result Date: 07/19/2019 CLINICAL DATA:  Sepsis EXAM: CHEST - 2 VIEW COMPARISON:  August 18, 2018 FINDINGS: There is no edema or consolidation. Heart is upper normal in size with pulmonary vascularity normal. There is aortic atherosclerosis. No adenopathy. Central catheter tip is at the cavoatrial junction. No pneumothorax. No bone lesions. IMPRESSION: No edema or consolidation. Stable cardiac silhouette. No adenopathy. Central catheter tip at cavoatrial junction. No pneumothorax. Aortic Atherosclerosis (ICD10-I70.0). Electronically Signed   By: Lowella Grip III M.D.   On: 07/19/2019 14:05   Ct Head Wo Contrast  Result Date: 07/19/2019 CLINICAL DATA:  68 year old female with altered mental status  after dialysis this morning. EXAM: CT HEAD WITHOUT CONTRAST TECHNIQUE: Contiguous axial images were obtained from the base of the skull through the vertex without intravenous contrast. COMPARISON:  Brain MRI 03/03/2018.  Head CT 08/22/2010. FINDINGS: Brain: Generalized cerebral volume loss since the 2011 CT. No ventriculomegaly. No midline shift, mass effect, or evidence of intracranial mass lesion. No acute intracranial hemorrhage identified. Stable small chronic infarcts in the left cerebellum and left thalamus. No definite cortical encephalomalacia. No cortically based acute infarct identified. Vascular: Extensive Calcified atherosclerosis at the skull base. No suspicious intracranial vascular hyperdensity. Skull: No acute osseous abnormality identified. Sinuses/Orbits: Visualized paranasal sinuses and mastoids are stable and well pneumatized. Other: Stable orbit and scalp soft tissues. IMPRESSION: No acute intracranial abnormality identified. Chronic small vessel disease appears stable since the 2019 MRI. Electronically Signed   By: Genevie Ann M.D.   On: 07/19/2019 14:04   Mr Brain Wo Contrast  Result Date: 07/19/2019 CLINICAL DATA:  68 year old female with altered mental status after dialysis this morning. Back pain on the right radiating to the hip and down the right leg since this morning. Fall several weeks ago. EXAM: MRI HEAD WITHOUT CONTRAST TECHNIQUE: Multiplanar, multiecho pulse sequences of the brain and surrounding structures were obtained without intravenous contrast. COMPARISON:  Head CT and CT Abdomen and Pelvis earlier today. [facility] Brain MRI 03/03/2018. FINDINGS: Brain: No restricted diffusion to suggest acute infarction. No midline shift, mass effect, evidence of mass lesion, ventriculomegaly, extra-axial collection or acute intracranial hemorrhage. Cervicomedullary junction and pituitary are within normal limits. Small chronic infarcts in the left cerebellum and left thalamus  are stable. Widespread increased perivascular spaces are stable. Mild for age cerebral white matter T2 and FLAIR hyperintensity. No cortical encephalomalacia. No  chronic blood products. Vascular: Major intracranial vascular flow voids are stable. Skull and upper cervical spine: Generalized decreased T1 marrow signal, probably related to renal osteodystrophy in this clinical setting. Otherwise negative visible cervical spine. Sinuses/Orbits: Stable, negative. Other: Mastoids remain clear. Grossly normal visible internal auditory structures. Scalp and face soft tissues appear negative. IMPRESSION: No acute intracranial abnormality. Stable chronic small vessel disease since 2019, primarily in the left thalamus and cerebellum. Electronically Signed   By: Genevie Ann M.D.   On: 07/19/2019 17:11   Mr Lumbar Spine Wo Contrast  Result Date: 07/19/2019 CLINICAL DATA:  68 year old female with altered mental status after dialysis this morning. Back pain on the right radiating to the hip and down the right leg since this morning. Fall several weeks ago. EXAM: MRI LUMBAR SPINE WITHOUT CONTRAST TECHNIQUE: Multiplanar, multisequence MR imaging of the lumbar spine was performed. No intravenous contrast was administered. COMPARISON:  Head CT and CT Abdomen and Pelvis earlier today. [facility] Brain MRI 03/03/2018. FINDINGS: Segmentation:  Normal on the comparison CT. Alignment: Stable straightening of lumbar lordosis as seen by CT earlier today. Vertebrae: Patchy marrow edema in both the L3 and L4 vertebral bodies. The pedicles and posterior elements appear spared. And there appear to be defects about the L3-L4 disc, probably allowing for herniation of disc material into the bodies. There is faint associated edema in the medial psoas muscles at this level (more so the right series 16, image 3), but no other regional soft tissue inflammation. Bone marrow signal is highly heterogeneous throughout the remaining visible  spine and pelvis, with geographic areas of decreased T1 marrow signal throughout, but no associated marrow edema. The visible SI joints appear intact. Conus medullaris and cauda equina: Conus extends to the L1-L2 level. No lower spinal cord or conus signal abnormality. Paraspinal and other soft tissues: Stable from the earlier CT. A degree of renal atrophy is noted. Disc levels: Mild for age disc degeneration. There is disc bulging at L2-L3 through L5-S1. There is mild multifactorial spinal stenosis at L3-L4 when combined with posterior element hypertrophy and endplate spurring. And there is similar multifactorial moderate bilateral L3 through L5 neural foraminal stenosis. IMPRESSION: 1. Abnormal L3 and L4 vertebral bodies and intervening disc as seen by CT. Although Infectious Discitis Osteomyelitis is possible, this could also be the sequelae of hemodialysis related spondyloarthropathy and/or intravertebral disc herniations (Schmorl's nodes). Blood cultures today are pending, and if negative recommend a short interval repeat MRI without contrast (e.g. 3-4 weeks) to document stability. 2. Markedly heterogeneous marrow signal throughout the visible spine and pelvis, but absent marrow edema except #1. Favor renal osteodystrophy. 3. Degenerative mild spinal stenosis at L3-L4, and moderate bilateral L3 through L5 neural foraminal stenosis. Electronically Signed   By: Genevie Ann M.D.   On: 07/19/2019 17:29   Ct Renal Stone Study  Result Date: 07/19/2019 CLINICAL DATA:  Flank pain, also recent fall EXAM: CT ABDOMEN AND PELVIS WITHOUT CONTRAST TECHNIQUE: Multidetector CT imaging of the abdomen and pelvis was performed following the standard protocol without IV contrast. COMPARISON:  None FINDINGS: Lower chest: Dependent ground-glass opacity in the lung bases likely reflective of atelectasis. Borderline cardiomegaly with trace pericardial effusion. Extensive atherosclerotic calcification of the coronary arteries.  Calcification of the aortic leaflets. A dialysis catheter tip is partially visualized at the level of the right atrium. Hepatobiliary: No focal liver abnormality is seen. No gallstones, gallbladder wall thickening, or biliary dilatation. Pancreas: Unremarkable. No pancreatic ductal dilatation or surrounding inflammatory  changes. Spleen: Normal in size without focal abnormality. Adrenals/Urinary Tract: Normal appearance of the adrenal glands. Both kidneys appear atrophic. A 14 13 mm fluid attenuation cysts is seen in the interpolar region right kidney. A smaller 5 mm hyperattenuating exophytic, potentially cystic lesion arising from the left upper pole. Similar 3 mm hyperattenuating lesion is seen in the posterior right upper pole. The smaller lesion is indeterminate on this noncontrast enhanced study. No visible urolithiasis or hydronephrosis. Mild left urothelial thickening at the renal pelvis is noted. Bladder is decompressed with circumferential wall thickening though this is somewhat greater than expected with mild perivesicular haze. Stomach/Bowel: Moderate hiatal hernia. Distal stomach and duodenum are unremarkable. No small bowel dilatation or wall thickening. There is borderline dilation up to 8 mm of a fluid-filled appendix seen in the right lower quadrant without significant periappendiceal fat stranding or pericecal inflammation. Scattered colonic diverticula without focal pericolonic inflammation to suggest diverticulitis. Vascular/Lymphatic: Extensive atherosclerotic calcification is seen of the aorta and branch vessels. Evaluation for luminal stenosis is limited in absence of contrast media. No suspicious or enlarged lymph nodes in the included lymphatic chains. Reproductive: Uterus is surgically absent. No concerning adnexal lesions. Other: No abdominopelvic free fluid or free gas. No bowel containing hernias. Postsurgical changes of the anterior abdomen. Musculoskeletal: There is diffusely includes  sclerosis of the L3 and L4 vertebral bodies with multilobulated lytic appearing lesions involving centered upon the disc space which also demonstrates central hypoattenuation. Please soft tissue thickening anteriorly at this level as well. Concerning lead there is an additional lucent lesion within the left sacral ala though this could reflect a separate process as the appearance is discs similar and is likely more compatible with irregular marrow heterogeneity in the setting of dialysis dependence. IMPRESSION: 1. Sclerosis of L3 and 4 with irregular lucencies in the end plates and hypoattenuation of the intervertebral disc space with adjacent hazy stranding. Findings are concerning for discitis/osteomyelitis. Recommend evaluation with a noncontrast MRI of the lumbar spine for further evaluation given patient's renal compromise. 2. Presence of an additional lucent lesion in the left sacral ala however the appearance is dissimilar to the aforementioned lucency within L3 and L4. Finding could reflect irregular marrow heterogeneity in the setting of dialysis dependence the malignancy is excluded. Will be better evaluated on MR as well. 3. Mild left urothelial thickening at the left renal pelvis and questionable thickening of the urinary bladder, nonspecific, but can be seen with ascending infection. Correlate with urinalysis. 4. Borderline dilation of a fluid-filled appendix up to 8 mm without significant periappendiceal fat stranding or pericecal inflammation. Most likely normal for this patient unless additional clinical findings are present to suggest appendicitis. 5. Moderate hiatal hernia. 6. Two subcentimeter, hyperattenuating, potentially cystic lesion arises from the upper pole of both kidneys. Consider further evaluation with nonemergent renal ultrasound for further evaluation. 7. Aortic Atherosclerosis (ICD10-I70.0). These results and recommendations were called by telephone at the time of interpretation on  07/19/2019 at 2:25 pm to Dr. Nanda Quinton , who verbally acknowledged these results. Electronically Signed   By: Lovena Le M.D.   On: 07/19/2019 14:26        Scheduled Meds:  amLODipine  10 mg Oral QPC lunch   aspirin EC  81 mg Oral QPC lunch   calcium carbonate  1 tablet Oral BID   carvedilol  6.25 mg Oral BID   Chlorhexidine Gluconate Cloth  6 each Topical Q0600   clopidogrel  75 mg Oral Daily   feeding  supplement (NEPRO CARB STEADY)  237 mL Oral BID BM   heparin  5,000 Units Subcutaneous Q8H   insulin aspart  0-5 Units Subcutaneous QHS   insulin aspart  0-9 Units Subcutaneous TID WC   isosorbide mononitrate  30 mg Oral QPC lunch   multivitamin  1 tablet Oral QPC lunch   sodium chloride flush  3 mL Intravenous Q12H   Continuous Infusions:  sodium chloride     ceFEPime (MAXIPIME) IV       LOS: 1 day    Time spent: 30 minutes     Darleen Crocker, DO [facility] Pager [phone]  If 7PM-7AM, please contact night-coverage [email or web address] Password [email or web address] 07/21/2019, 1:12 PM

## 2019-07-21 NOTE — Progress Notes (Signed)
Initial Nutrition Assessment  DOCUMENTATION CODES:      INTERVENTION:  Nepro Shake po BID daily, each supplement provides 425 kcal and 19 grams protein    NUTRITION DIAGNOSIS:   Increased nutrient needs related to chronic illness(ESRD/HD) as evidenced by estimated needs.    GOAL:  Pt to meet >/= 90% of their estimated nutrition needs     MONITOR:  PO intake, Supplement acceptance, Labs, Weight trends(Tolerance of oral intake)   REASON FOR ASSESSMENT:   Malnutrition Screening Tool    ASSESSMENT: patient is a 68 yo female with ESRD/HD, GERD, DM-2, Constipation and Hypertension. Presents with altered mental status, c/o back pain.   Home diet is Renal. Her appetite is very poor currently. Upon RD arrival patient is complaining of feeling nauseated and vomiting. Her breakfast tray is here and untouched and lunch is on the way. She is unable to provide details of usual intake at this time due to her acute illness.   Patient dry wt in the community unknown. Hospital records show downward trend from May to August 7.4 kg (10%) which is significant for time indicated.     Medications reviewed and include: TUMS, Insulin, Rena-vite.   Labs: BMP Latest Ref Rng & Units 07/21/2019 07/20/2019 07/19/2019  Glucose 70 - 99 mg/dL 144(H) 152(H) 145(H)  BUN 8 - 23 mg/dL 49(H) 42(H) 28(H)  Creatinine 0.44 - 1.00 mg/dL 7.00(H) 6.39(H) 4.44(H)  Sodium 135 - 145 mmol/L 138 137 136  Potassium 3.5 - 5.1 mmol/L 5.6(H) 4.9 4.3  Chloride 98 - 111 mmol/L 101 98 100  CO2 22 - 32 mmol/L 20(L) 28 25  Calcium 8.9 - 10.3 mg/dL 8.7(L) 8.5(L) 8.1(L)     NUTRITION - FOCUSED PHYSICAL EXAM:  Unable to complete Nutrition-Focused physical exam at this time.  Patient is vomiting during visit.    Diet Order:   Diet Order            Diet renal/carb modified with fluid restriction Diet-HS Snack? Nothing; Fluid restriction: 1200 mL Fluid; Room service appropriate? Yes; Fluid consistency: Thin  Diet effective now               EDUCATION NEEDS: not addressed today  Not appropriate for education at this time Skin:  Skin Assessment: Reviewed RN Assessment  Last BM:  8/27  Height:   Ht Readings from Last 1 Encounters:  07/20/19 5\' 7"  (1.702 m)    Weight:   Wt Readings from Last 1 Encounters:  07/21/19 64.3 kg    Ideal Body Weight:  61 kg  BMI:  Body mass index is 22.2 kg/m.  Estimated Nutritional Needs:   Kcal:  1900-2000 (~30 kcal/kg/bw)  Protein:  83-90 gr (1.3-1.4 gr/kg/bw)  Fluid:  1200 ml daily   Colman Cater MS,RD,CSG,LDN Office: [phone] Pager: [phone]

## 2019-07-22 ENCOUNTER — Inpatient Hospital Stay (HOSPITAL_COMMUNITY): Payer: Medicare PPO

## 2019-07-22 DIAGNOSIS — I313 Pericardial effusion (noninflammatory): Secondary | ICD-10-CM

## 2019-07-22 LAB — CULTURE, BLOOD (ROUTINE X 2)
Special Requests: ADEQUATE
Special Requests: ADEQUATE

## 2019-07-22 LAB — BASIC METABOLIC PANEL
Anion gap: 17 — ABNORMAL HIGH (ref 5–15)
BUN: 58 mg/dL — ABNORMAL HIGH (ref 8–23)
CO2: 23 mmol/L (ref 22–32)
Calcium: 8.9 mg/dL (ref 8.9–10.3)
Chloride: 100 mmol/L (ref 98–111)
Creatinine, Ser: 8.33 mg/dL — ABNORMAL HIGH (ref 0.44–1.00)
GFR calc Af Amer: 5 mL/min — ABNORMAL LOW (ref >60)
GFR calc non Af Amer: 4 mL/min — ABNORMAL LOW (ref >60)
Glucose, Bld: 104 mg/dL — ABNORMAL HIGH (ref 70–99)
Potassium: 5.1 mmol/L (ref 3.5–5.1)
Sodium: 140 mmol/L (ref 135–145)

## 2019-07-22 LAB — GLUCOSE, CAPILLARY
Glucose-Capillary: 106 mg/dL — ABNORMAL HIGH (ref 70–99)
Glucose-Capillary: 127 mg/dL — ABNORMAL HIGH (ref 70–99)
Glucose-Capillary: 119 mg/dL — ABNORMAL HIGH (ref 70–99)

## 2019-07-22 LAB — CBC
HCT: 42.6 % (ref 36.0–46.0)
Hemoglobin: 12.7 g/dL (ref 12.0–15.0)
MCH: 27.5 pg (ref 26.0–34.0)
MCHC: 29.8 g/dL — ABNORMAL LOW (ref 30.0–36.0)
MCV: 92.2 fL (ref 80.0–100.0)
Platelets: 233 10*3/uL (ref 150–400)
RBC: 4.62 MIL/uL (ref 3.87–5.11)
RDW: 15.9 % — ABNORMAL HIGH (ref 11.5–15.5)
WBC: 14.3 10*3/uL — ABNORMAL HIGH (ref 4.0–10.5)
nRBC: 0 % (ref 0.0–0.2)

## 2019-07-22 LAB — ECHOCARDIOGRAM COMPLETE
Height: 67 in
Weight: 2211.65 oz

## 2019-07-22 MED ORDER — SODIUM CHLORIDE 0.9 % IV SOLN: 100.0000 mL | INTRAVENOUS | Status: DC | PRN

## 2019-07-22 MED ORDER — HALOPERIDOL LACTATE 5 MG/ML IJ SOLN
2.0000 mg | Freq: Four times a day (QID) | INTRAMUSCULAR | Status: DC | PRN
  Administered 2019-07-22: 12:00:00 1 mg via INTRAVENOUS
  Filled 2019-07-22: qty 1

## 2019-07-22 MED ORDER — LABETALOL HCL 5 MG/ML IV SOLN
10.0000 mg | Freq: Once | INTRAVENOUS | Status: AC
  Administered 2019-07-22: 16:00:00 5 mg via INTRAVENOUS
  Filled 2019-07-22: qty 4

## 2019-07-22 MED ORDER — HALOPERIDOL LACTATE 5 MG/ML IJ SOLN
1.0000 mg | Freq: Four times a day (QID) | INTRAMUSCULAR | Status: DC | PRN
  Administered 2019-07-22: 10:00:00 1 mg via INTRAVENOUS
  Filled 2019-07-22: qty 1

## 2019-07-22 MED ORDER — ALTEPLASE 2 MG IJ SOLR
2.0000 mg | Freq: Once | INTRAMUSCULAR | Status: AC | PRN
  Administered 2019-07-22: 15:00:00 4 mg

## 2019-07-22 MED ORDER — LEVETIRACETAM IN NACL 500 MG/100ML IV SOLN
500.0000 mg | INTRAVENOUS | Status: DC
  Filled 2019-07-22: qty 100

## 2019-07-22 MED ORDER — HEPARIN (PORCINE) 25000 UT/250ML-% IV SOLN
INTRAVENOUS | Status: AC
  Filled 2019-07-22: qty 1000

## 2019-07-22 MED ORDER — HEPARIN SODIUM (PORCINE) 1000 UNIT/ML DIALYSIS: 1000.0000 [IU] | INTRAMUSCULAR | Status: DC | PRN

## 2019-07-22 MED ORDER — SODIUM CHLORIDE 0.9 % IV SOLN: 2.0000 g | Freq: Once | INTRAVENOUS | Status: DC

## 2019-07-22 MED ORDER — HALOPERIDOL LACTATE 5 MG/ML IJ SOLN: 2.0000 mg | Freq: Four times a day (QID) | INTRAMUSCULAR | Status: DC | PRN

## 2019-07-22 MED ORDER — HEPARIN SODIUM (PORCINE) 1000 UNIT/ML DIALYSIS
3800.0000 [IU] | INTRAMUSCULAR | Status: DC | PRN
  Administered 2019-07-25: 3800 [IU] via INTRAVENOUS_CENTRAL
  Filled 2019-07-22 (×2): qty 4

## 2019-07-22 NOTE — Progress Notes (Signed)
Admit: 07/20/2019 LOS: 2  31F ESRD [facility] MWF via [facility] with pseudomal bacteremia and likely OM  Subjective:  . HD postponed to today .  Some confusion overnight . PsA is pansensitive, on cefepime  08/28 0701 - 08/29 0700 In: 480 [P.O.:480] Out: -   Filed Weights   07/20/19 1312 07/21/19 0339 07/22/19 0523  Weight: 67.1 kg 64.3 kg 62.7 kg    Scheduled Meds: . amLODipine  10 mg Oral QPC lunch  . aspirin EC  81 mg Oral QPC lunch  . calcium carbonate  1 tablet Oral BID  . carvedilol  6.25 mg Oral BID  . Chlorhexidine Gluconate Cloth  6 each Topical Q0600  . clopidogrel  75 mg Oral Daily  . feeding supplement (NEPRO CARB STEADY)  237 mL Oral BID BM  . heparin  5,000 Units Subcutaneous Q8H  . insulin aspart  0-5 Units Subcutaneous QHS  . insulin aspart  0-9 Units Subcutaneous TID WC  . isosorbide mononitrate  30 mg Oral QPC lunch  . multivitamin  1 tablet Oral QPC lunch  . sodium chloride flush  3 mL Intravenous Q12H   Continuous Infusions: . sodium chloride    . ceFEPime (MAXIPIME) IV    . ceFEPime (MAXIPIME) IV     PRN Meds:.sodium chloride, acetaminophen **OR** acetaminophen, alum & mag hydroxide-simeth, fluticasone, haloperidol lactate, hydrALAZINE, HYDROmorphone (DILAUDID) injection, ondansetron (ZOFRAN) IV, promethazine, sodium chloride flush  Current Labs: reviewed    Physical Exam:  Blood pressure (!) 177/92, pulse (!) 101, temperature 99.1 F (37.3 C), temperature source Oral, resp. rate 16, height 5\' 7"  (1.702 m), weight 62.7 kg, SpO2 98 %. RRR nl s1s2 R IJ TDC C/D/I No LEE CTAB S/nt/nd  A 1. ESRD MWF [facility] via [facility] [place] 2. Pseudomonal Bacteremia, pan sensitive on cefepime 3. ? L3 and L4 diskitis / OM 4. AMS 5. Anemia, Hb stable 6. Hyperkalemia, mild 7. CKDBMD  P . HD today, back on schedule next week . Needs HD cath removed post HD for holiday -- have d/w Dr. Arnoldo Morale who will address today or tomorrow . Medication Issues; o Preferred  narcotic agents for pain control are hydromorphone, fentanyl, and methadone. Morphine should not be used.  o Baclofen should be avoided o Avoid oral sodium phosphate and magnesium citrate based laxatives / bowel preps    Pearson Grippe MD 07/22/2019, 12:05 PM  Recent Labs  Lab 07/20/19 1445 07/21/19 0819 07/22/19 0617  NA 137 138 140  K 4.9 5.6* 5.1  CL 98 101 100  CO2 28 20* 23  GLUCOSE 152* 144* 104*  BUN 42* 49* 58*  CREATININE 6.39* 7.00* 8.33*  CALCIUM 8.5* 8.7* 8.9   Recent Labs  Lab 07/19/19 1454 07/20/19 1445 07/21/19 0819 07/22/19 0617  WBC 22.8* 19.1* 17.4* 14.3*  NEUTROABS 20.7* 15.4*  --   --   HGB 11.5* 12.2 13.5 12.7  HCT 37.5 41.4 44.2 42.6  MCV 90.1 92.2 88.9 92.2  PLT 213 203 230 233

## 2019-07-22 NOTE — Progress Notes (Signed)
PROGRESS NOTE    [name]  UEK:[id number] DOB: 05/03/1951 DOA: 07/20/2019 PCP: Manon Hilding, MD   Brief Narrative:  Per HPI: [name] a67 y.o.female,w hypertension, hyperlipidemia, Dm2 w ESRD on HD (M,W, F), h/o CVA, Jerrye Bushy, apparently presents due to being called by ER for bacteremia, (gram negative rod), on blood culture 07/19/2019. Pt was seen yesterday for AMS. Appears wnl today.   8/28: Patient was admitted with gram-negative rod bacteremia which now is confirmed to be Pseudomonas bacteremia with concern for infectious discitis/osteomyelitis at L3/L4.  She has been started on cefepime with sensitivities still pending.  Nephrology has evaluated patient with plans for hemodialysis later today.  She was struggling with hypertension earlier this morning that improved with use of labetalol and continues to have some ongoing nausea with poor response to Zofran.  Patient will likely require new catheter placement versus exchange soon.  8/29: Patient noted to have periods of agitation this morning with some hallucinations for which Haldol has been given.  She maintains a persistent tachycardia.  Plans for hemodialysis today per nephrology and likely catheter removal in a.m.  Continue on cefepime as patient is pansensitive.  2D echocardiogram without vegetations noted.  Assessment & Plan:   Principal Problem:   Bacteremia Active Problems:   ESRD on dialysis [facility])   Essential hypertension   Type 2 diabetes mellitus with diabetic nephropathy (HCC)   Pseudomonas bacteremia with discitis/osteomyelitis of L3/L4 -Noted to be pansensitive -Continue empirically on cefepime and will plan to discuss further with ID tomorrow -ESR elevation noted on admission  Acute encephalopathy with hallucinations secondary to dementia -Haldol ordered as needed for agitation -Continue to monitor carefully  Persistent nausea/vomiting-improved -Zofran ordered as needed -We will add  Phenergan to assist with improvement in symptoms  Tachycardia -TSH 1.028 -2D echocardiogram with no valvular vegetations or acute abnormalities -We will try 1 dose of labetalol today prior to dialysis  Severe hypertension -Continue on Norvasc, carvedilol, and Imdur -Given labetalol earlier with improvement -Hydralazine ordered as needed  Type 2 diabetes -SSI  History of prior CVA -Continue aspirin and Plavix -Lipid panel pending, uncertain why patient is not on statin  ESRD on HD M/W/F with hyperkalemia-improved -Continue Rena-Vite -Appreciate nephrology evaluation with plans for hemodialysis later today -Recheck BMP in a.m. -Plan to remove temporary dialysis catheter tomorrow per nephrology and general surgery   DVT prophylaxis: Heparin Code Status: Full Family Communication:  Sister at bedside Disposition Plan: Continue on IV antibiotics and plan for hemodialysis today.  Will need catheter removal soon and general surgery aware for potential removal in a.m.   Consultants:   Nephrology  General surgery  Procedures:   None  Antimicrobials:  Anti-infectives (From admission, onward)   Start     Dose/Rate Route Frequency Ordered Stop   07/22/19 1600  ceFEPIme (MAXIPIME) 2 g in sodium chloride 0.9 % 100 mL IVPB     2 g 200 mL/hr over 30 Minutes Intravenous Once in dialysis 07/22/19 1044     07/21/19 1600  ceFEPIme (MAXIPIME) 2 g in sodium chloride 0.9 % 100 mL IVPB     2 g 200 mL/hr over 30 Minutes Intravenous Once per day on Mon Wed Fri 07/20/19 1618     07/20/19 1500  ceFEPIme (MAXIPIME) 1 g in sodium chloride 0.9 % 100 mL IVPB  Status:  Discontinued     1 g 200 mL/hr over 30 Minutes Intravenous Every 24 hours 07/20/19 1430 07/20/19 1432   07/20/19 1500  ceFEPIme (MAXIPIME) 2  g in sodium chloride 0.9 % 100 mL IVPB     2 g 200 mL/hr over 30 Minutes Intravenous  Once 07/20/19 1432 07/20/19 1659       Subjective: Patient seen and evaluated today with  some ongoing agitation and hallucinations noted this morning.  She has remained somewhat tachycardic throughout the day.  Objective: Vitals:   07/21/19 2110 07/22/19 0523 07/22/19 0824 07/22/19 1430  BP: (!) 146/87 (!) 177/92  (!) 166/99  Pulse: 95 (!) 101  (!) 133  Resp: _0 Temp: 97.8 F (36.6 C) 99.1 F (37.3 C)  98.3 F (36.8 C)  TempSrc: Oral Oral  Oral  SpO2: 99% 99% 98% 97%  Weight:  62.7 kg  62.7 kg  Height:        Intake/Output Summary (Last 24 hours) at 07/22/2019 1515 Last data filed at 07/22/2019 1500 Gross per 24 hour  Intake 60 ml  Output -  Net 60 ml   Filed Weights   07/21/19 0339 07/22/19 0523 07/22/19 1430  Weight: 64.3 kg 62.7 kg 62.7 kg    Examination:  General exam: Appears minimally agitated and anxious Respiratory system: Clear to auscultation. Respiratory effort normal. Cardiovascular system: S1 & S2 heard, RRR, tachycardic. No JVD, murmurs, rubs, gallops or clicks. No pedal edema. Gastrointestinal system: Abdomen is nondistended, soft and nontender. No organomegaly or masses felt. Normal bowel sounds heard. Central nervous system: Alert and and in minimal distress Extremities: Symmetric 5 x 5 power. Skin: No rashes, lesions or ulcers Psychiatry: Cannot be assessed given patient condition.    Data Reviewed: I have personally reviewed following labs and imaging studies  CBC: Recent Labs  Lab 07/19/19 1454 07/20/19 1445 07/21/19 0819 07/22/19 0617  WBC 22.8* 19.1* 17.4* 14.3*  NEUTROABS 20.7* 15.4*  --   --   HGB 11.5* 12.2 13.5 12.7  HCT 37.5 41.4 44.2 42.6  MCV 90.1 92.2 88.9 92.2  PLT 213 203 230 850   Basic Metabolic Panel: Recent Labs  Lab 07/19/19 1336 07/20/19 1445 07/21/19 0819 07/22/19 0617  NA 136 137 138 140  K 4.3 4.9 5.6* 5.1  CL 100 98 101 100  CO2 25 28 20* 23  GLUCOSE 145* 152* 144* 104*  BUN 28* 42* 49* 58*  CREATININE 4.44* 6.39* 7.00* 8.33*  CALCIUM 8.1* 8.5* 8.7* 8.9  MG 1.6*  --   --   --     GFR: Estimated Creatinine Clearance: 6.3 mL/min (A) (by C-G formula based on SCr of 8.33 mg/dL (H)). Liver Function Tests: Recent Labs  Lab 07/19/19 1336 07/20/19 1445 07/21/19 0819  AST _1 ALT _2 ALKPHOS 104 116 116  BILITOT 0.6 0.5 0.6  PROT 7.4 8.3* 8.0  ALBUMIN 2.9* 3.1* 3.1*   No results for input(s): LIPASE, AMYLASE in the last 168 hours. No results for input(s): AMMONIA in the last 168 hours. Coagulation Profile: Recent Labs  Lab 07/19/19 1336 07/20/19 1445  INR 1.1 1.0   Cardiac Enzymes: No results for input(s): CKTOTAL, CKMB, CKMBINDEX, TROPONINI in the last 168 hours. BNP (last 3 results) No results for input(s): PROBNP in the last 8760 hours. HbA1C: No results for input(s): HGBA1C in the last 72 hours. CBG: Recent Labs  Lab 07/21/19 1116 07/21/19 1615 07/21/19 2111 07/22/19 0749 07/22/19 1118  GLUCAP 180* 176* 118* 106* 127*   Lipid Profile: No results for input(s): CHOL, HDL, LDLCALC, TRIG, CHOLHDL, LDLDIRECT in the last 72 hours. Thyroid Function  Tests: No results for input(s): TSH, T4TOTAL, FREET4, T3FREE, THYROIDAB in the last 72 hours. Anemia Panel: No results for input(s): VITAMINB12, FOLATE, FERRITIN, TIBC, IRON, RETICCTPCT in the last 72 hours. Sepsis Labs: Recent Labs  Lab 07/19/19 1336  LATICACIDVEN 1.2    Recent Results (from the past 240 hour(s))  Urine culture     Status: Abnormal   Collection Time: 07/19/19 12:19 PM   Specimen: Urine, Catheterized  Result Value Ref Range Status   Specimen Description   Final    URINE, CATHETERIZED Performed at [facility], [address] [place], [place] [address]    Special Requests   Final    Normal Performed at [facility], [address] [place], [place] [address]    Culture (A)  Final    <10,000 COLONIES/mL INSIGNIFICANT GROWTH Performed at [facility] [address], [place], [place] [address]    Report Status 07/20/2019 FINAL  Final  Culture, blood (Routine  x 2)     Status: Abnormal   Collection Time: 07/19/19  1:36 PM   Specimen: BLOOD RIGHT ARM  Result Value Ref Range Status   Specimen Description   Final    BLOOD RIGHT ARM Performed at [facility], [address], [place], [place] [address]    Special Requests   Final    BOTTLES DRAWN AEROBIC ONLY Blood Culture adequate volume Performed at [facility], [address], [place], [place] [address]    Culture  Setup Time   Final    GRAM NEGATIVE RODS AEROBIC BOTTLE ONLY Gram Stain Report Called to,Read Back By and Verified With: HARTLEY,A_0  @ 1100 BY MATTHEWS, B 8.27.2020 [facility] Organism ID to follow CRITICAL RESULT CALLED TO, READ BACK BY AND VERIFIED WITH: Marissa Nestle RN 07/20/19 1905 Performed at [facility], [place] [address] [place], [place] [address]    Culture PSEUDOMONAS AERUGINOSA (A)  Final   Report Status 07/22/2019 FINAL  Final   Organism ID, Bacteria PSEUDOMONAS AERUGINOSA  Final      Susceptibility   Pseudomonas aeruginosa - MIC*    CEFTAZIDIME 2 SENSITIVE Sensitive     CIPROFLOXACIN <=0.25 SENSITIVE Sensitive     GENTAMICIN <=1 SENSITIVE Sensitive     IMIPENEM 2 SENSITIVE Sensitive     PIP/TAZO <=4 SENSITIVE Sensitive     CEFEPIME <=1 SENSITIVE Sensitive     * PSEUDOMONAS AERUGINOSA  Culture, blood (Routine x 2)     Status: Abnormal   Collection Time: 07/19/19  1:36 PM   Specimen: BLOOD RIGHT HAND  Result Value Ref Range Status   Specimen Description   Final    BLOOD RIGHT HAND Performed at [facility], [address] [place], [place] [address]    Special Requests   Final    AEROBIC BOTTLE ONLY Blood Culture adequate volume Performed at [facility], [address] [place], [place] [address]    Culture  Setup Time   Final    AEROBIC BOTTLE ONLY GRAM NEGATIVE RODS CRITICAL VALUE NOTED.  VALUE IS CONSISTENT WITH PREVIOUSLY REPORTED AND CALLED VALUE.    Culture (A)  Final    PSEUDOMONAS AERUGINOSA SUSCEPTIBILITIES PERFORMED ON PREVIOUS  CULTURE WITHIN THE LAST 5 DAYS. Performed at [facility], [place] [address], [place], [place] [address]    Report Status 07/22/2019 FINAL  Final  Blood Culture ID Panel (Reflexed)     Status: Abnormal   Collection Time: 07/19/19  1:36 PM  Result Value Ref Range Status  Enterococcus species NOT DETECTED NOT DETECTED Final   Listeria monocytogenes NOT DETECTED NOT DETECTED Final   Staphylococcus species NOT DETECTED NOT DETECTED Final   Staphylococcus aureus (BCID) NOT DETECTED NOT DETECTED Final   Streptococcus species NOT DETECTED NOT DETECTED Final   Streptococcus agalactiae NOT DETECTED NOT DETECTED Final   Streptococcus pneumoniae NOT DETECTED NOT DETECTED Final   Streptococcus pyogenes NOT DETECTED NOT DETECTED Final   Acinetobacter baumannii NOT DETECTED NOT DETECTED Final   Enterobacteriaceae species NOT DETECTED NOT DETECTED Final   Enterobacter cloacae complex NOT DETECTED NOT DETECTED Final   Escherichia coli NOT DETECTED NOT DETECTED Final   Klebsiella oxytoca NOT DETECTED NOT DETECTED Final   Klebsiella pneumoniae NOT DETECTED NOT DETECTED Final   Proteus species NOT DETECTED NOT DETECTED Final   Serratia marcescens NOT DETECTED NOT DETECTED Final   Carbapenem resistance NOT DETECTED NOT DETECTED Final   Haemophilus influenzae NOT DETECTED NOT DETECTED Final   Neisseria meningitidis NOT DETECTED NOT DETECTED Final   Pseudomonas aeruginosa DETECTED (A) NOT DETECTED Final    Comment: CRITICAL RESULT CALLED TO, READ BACK BY AND VERIFIED WITH: Marissa Nestle RN 07/20/19 1905 JDW    Candida albicans NOT DETECTED NOT DETECTED Final   Candida glabrata NOT DETECTED NOT DETECTED Final   Candida krusei NOT DETECTED NOT DETECTED Final   Candida parapsilosis NOT DETECTED NOT DETECTED Final   Candida tropicalis NOT DETECTED NOT DETECTED Final    Comment: Performed at [facility], [address] [place], [place] [address]  Culture, blood (Routine x 2)     Status: None  (Preliminary result)   Collection Time: 07/20/19  2:45 PM   Specimen: BLOOD LEFT HAND  Result Value Ref Range Status   Specimen Description BLOOD LEFT HAND  Final   Special Requests   Final    BOTTLES DRAWN AEROBIC ONLY Blood Culture adequate volume   Culture   Final    NO GROWTH 2 DAYS Performed at [facility], [address] [place], [place] [address]    Report Status PENDING  Incomplete  Culture, blood (Routine x 2)     Status: None (Preliminary result)   Collection Time: 07/20/19  2:45 PM   Specimen: BLOOD RIGHT HAND  Result Value Ref Range Status   Specimen Description BLOOD RIGHT HAND  Final   Special Requests   Final    BOTTLES DRAWN AEROBIC ONLY Blood Culture adequate volume   Culture   Final    NO GROWTH 2 DAYS Performed at [facility], [address] [place] [address] [place], [place] [address]    Report Status PENDING  Incomplete  SARS Coronavirus 2 [facility] order, Performed in [place] hospital lab) Nasopharyngeal Nasopharyngeal Swab     Status: None   Collection Time: 07/20/19  5:11 PM   Specimen: Nasopharyngeal Swab  Result Value Ref Range Status   SARS Coronavirus 2 NEGATIVE NEGATIVE Final    Comment: (NOTE) If result is NEGATIVE SARS-CoV-2 target nucleic acids are NOT DETECTED. The SARS-CoV-2 RNA is generally detectable in upper and lower  respiratory specimens during the acute phase of infection. The lowest  concentration of SARS-CoV-2 viral copies this assay can detect is 250  copies / mL. A negative result does not preclude SARS-CoV-2 infection  and should not be used as the sole basis for treatment or other  patient management decisions.  A negative result may occur with  improper specimen collection / handling, submission of specimen other  than nasopharyngeal swab, presence of  viral mutation(s) within the  areas targeted by this assay, and inadequate number of viral copies  (<250 copies / mL). A negative result must be combined with clinical  observations, patient  history, and epidemiological information. If result is POSITIVE SARS-CoV-2 target nucleic acids are DETECTED. The SARS-CoV-2 RNA is generally detectable in upper and lower  respiratory specimens dur ing the acute phase of infection.  Positive  results are indicative of active infection with SARS-CoV-2.  Clinical  correlation with patient history and other diagnostic information is  necessary to determine patient infection status.  Positive results do  not rule out bacterial infection or co-infection with other viruses. If result is PRESUMPTIVE POSTIVE SARS-CoV-2 nucleic acids MAY BE PRESENT.   A presumptive positive result was obtained on the submitted specimen  and confirmed on repeat testing.  While 2019 novel coronavirus  (SARS-CoV-2) nucleic acids may be present in the submitted sample  additional confirmatory testing may be necessary for epidemiological  and / or clinical management purposes  to differentiate between  SARS-CoV-2 and other Sarbecovirus currently known to infect humans.  If clinically indicated additional testing with an alternate test  methodology [phone]) is advised. The SARS-CoV-2 RNA is generally  detectable in upper and lower respiratory sp ecimens during the acute  phase of infection. The expected result is Negative. Fact Sheet for Patients:  StrictlyIdeas.no Fact Sheet for Healthcare Providers: BankingDealers.co.za This test is not yet approved or cleared by the [place] FDA and has been authorized for detection and/or diagnosis of SARS-CoV-2 by FDA under an Emergency Use Authorization (EUA).  This EUA will remain in effect (meaning this test can be used) for the duration of the COVID-19 declaration under Section 564(b)(1) of the Act, 21 U.S.C. section 360bbb-3(b)(1), unless the authorization is terminated or revoked sooner. Performed at [facility], [address], [place], [place] [address]           Radiology Studies: No results found.      Scheduled Meds: . amLODipine  10 mg Oral QPC lunch  . aspirin EC  81 mg Oral QPC lunch  . calcium carbonate  1 tablet Oral BID  . carvedilol  6.25 mg Oral BID  . Chlorhexidine Gluconate Cloth  6 each Topical Q0600  . clopidogrel  75 mg Oral Daily  . feeding supplement (NEPRO CARB STEADY)  237 mL Oral BID BM  . heparin  5,000 Units Subcutaneous Q8H  . insulin aspart  0-5 Units Subcutaneous QHS  . insulin aspart  0-9 Units Subcutaneous TID WC  . isosorbide mononitrate  30 mg Oral QPC lunch  . labetalol  10 mg Intravenous Once  . multivitamin  1 tablet Oral QPC lunch  . sodium chloride flush  3 mL Intravenous Q12H   Continuous Infusions: . sodium chloride    . sodium chloride    . sodium chloride    . ceFEPime (MAXIPIME) IV    . ceFEPime (MAXIPIME) IV       LOS: 2 days    Time spent: 30 minutes    Shaliah Wann Darleen Crocker, DO [facility] Pager [phone]  If 7PM-7AM, please contact night-coverage [email or web address] Password [email or web address] 07/22/2019, 3:15 PM

## 2019-07-22 NOTE — Progress Notes (Signed)
*  PRELIMINARY RESULTS* Echocardiogram 2D Echocardiogram has been performed.  [name] 07/22/2019, 12:41 PM

## 2019-07-22 NOTE — Procedures (Signed)
    HEMODIALYSIS TREATMENT NOTE:  Pt confused and restless--not her baseline, according to her sister and son.  Received Haldol at noon for agitation.  Son states onset of "dementia" was quite sudden after HD on 8/26.  Pt normally performs ADLs and drives self to appointments.  Today, speech is nonsensical and repetitive.  She is unable to keep eye contact and has a blank stare.  Catheter occluded pre-HD.  Alteplase instilled x 60 minutes.    ST 130s sustained x 60 minutes during activase instillation.  After d/w Dr. Manuella Ghazi, Labetalol '5mg'$  IVP given.  UF, then, limited by hypotension. Pt unable to report adverse symptoms. UF was suspended for SBP <110.  NS bolus given twice.  Max Qb 335 with lines reversed.    Goal NOT met: UF interrupted x40 min for hypotension.  1.3 liters removed.  All blood was returned.   Rockwell Alexandria, RN

## 2019-07-23 ENCOUNTER — Telehealth: Payer: Self-pay | Admitting: Emergency Medicine

## 2019-07-23 LAB — BASIC METABOLIC PANEL
Anion gap: 22 — ABNORMAL HIGH (ref 5–15)
BUN: 27 mg/dL — ABNORMAL HIGH (ref 8–23)
CO2: 20 mmol/L — ABNORMAL LOW (ref 22–32)
Calcium: 9 mg/dL (ref 8.9–10.3)
Chloride: 95 mmol/L — ABNORMAL LOW (ref 98–111)
Creatinine, Ser: 4.97 mg/dL — ABNORMAL HIGH (ref 0.44–1.00)
GFR calc Af Amer: 10 mL/min — ABNORMAL LOW (ref >60)
GFR calc non Af Amer: 8 mL/min — ABNORMAL LOW (ref >60)
Glucose, Bld: 171 mg/dL — ABNORMAL HIGH (ref 70–99)
Potassium: 4.8 mmol/L (ref 3.5–5.1)
Sodium: 137 mmol/L (ref 135–145)

## 2019-07-23 LAB — CBC
HCT: 46.6 % — ABNORMAL HIGH (ref 36.0–46.0)
Hemoglobin: 13.5 g/dL (ref 12.0–15.0)
MCH: 26.4 pg (ref 26.0–34.0)
MCHC: 29 g/dL — ABNORMAL LOW (ref 30.0–36.0)
MCV: 91 fL (ref 80.0–100.0)
Platelets: 277 10*3/uL (ref 150–400)
RBC: 5.12 MIL/uL — ABNORMAL HIGH (ref 3.87–5.11)
RDW: 16.3 % — ABNORMAL HIGH (ref 11.5–15.5)
WBC: 15.3 10*3/uL — ABNORMAL HIGH (ref 4.0–10.5)
nRBC: 0 % (ref 0.0–0.2)

## 2019-07-23 LAB — GLUCOSE, CAPILLARY
Glucose-Capillary: 168 mg/dL — ABNORMAL HIGH (ref 70–99)
Glucose-Capillary: 193 mg/dL — ABNORMAL HIGH (ref 70–99)
Glucose-Capillary: 274 mg/dL — ABNORMAL HIGH (ref 70–99)
Glucose-Capillary: 104 mg/dL — ABNORMAL HIGH (ref 70–99)

## 2019-07-23 MED ORDER — SODIUM CHLORIDE 0.9 % IV SOLN
1.0000 g | Freq: Every day | INTRAVENOUS | Status: DC
  Administered 2019-07-23: 12:00:00 1 g via INTRAVENOUS
  Filled 2019-07-23 (×2): qty 1

## 2019-07-23 MED ORDER — LEVETIRACETAM IN NACL 500 MG/100ML IV SOLN
500.0000 mg | INTRAVENOUS | Status: DC
  Administered 2019-07-23 – 2019-07-24 (×2): 500 mg via INTRAVENOUS
  Filled 2019-07-23: qty 100

## 2019-07-23 MED ORDER — CARVEDILOL 12.5 MG PO TABS
12.5000 mg | ORAL_TABLET | Freq: Two times a day (BID) | ORAL | Status: DC
  Administered 2019-07-23 – 2019-07-24 (×3): 12.5 mg via ORAL
  Filled 2019-07-23 (×3): qty 1

## 2019-07-23 NOTE — Progress Notes (Signed)
Pharmacy Antibiotic Note  [name] is a 68 y.o. female admitted on 07/20/2019 with gram negative rod bacteremia.  Pharmacy has been consulted for Cefepime dosing.   Plan: Change cefepime to 1000 mg IV every 24 hours. Patient did not receive 2000 mg dose 8/29 due to HD stopped. Monitor labs, c/s, and patient improvement.  Height: 5\' 7"  (170.2 cm) Weight: 132 lb 15 oz (60.3 kg) IBW/kg (Calculated) : 61.6  Temp (24hrs), Avg:98.5 F (36.9 C), Min:98.2 F (36.8 C), Max:98.9 F (37.2 C)  Recent Labs  Lab 07/19/19 1336 07/19/19 1454 07/20/19 1445 07/21/19 0819 07/22/19 0617 07/23/19 0621  WBC  --  22.8* 19.1* 17.4* 14.3* 15.3*  CREATININE 4.44*  --  6.39* 7.00* 8.33* 4.97*  LATICACIDVEN 1.2  --   --   --   --   --     Estimated Creatinine Clearance: 10.3 mL/min (A) (by C-G formula based on SCr of 4.97 mg/dL (H)).    No Known Allergies  Antimicrobials this admission: Cefepime 8/27 >>     Dose adjustments this admission: Cefepime  Microbiology results: 8/26 BCx: gram neg rods 8/27 BCx: ngtd   Thank you for allowing pharmacy to be a part of this patient's care.  Ramond Craver 07/23/2019 12:10 PM

## 2019-07-23 NOTE — Consult Note (Signed)
Requested to remove permacath due to Pseudomonas bacteremia.  RIJ Permacath removed at bedside without difficulty.  Pressure held.  Catheter tip sent for culture.  Patient tolerated the procedure very well.

## 2019-07-23 NOTE — Progress Notes (Addendum)
PROGRESS NOTE    [name]  TDV:[id number] DOB: 03/10/1951 DOA: 07/20/2019 PCP: Manon Hilding, MD   Brief Narrative:  Per HPI: [name] a67 y.o.female,w hypertension, hyperlipidemia, Dm2 w ESRD on HD (M,W, F), h/o CVA, Jerrye Bushy, apparently presents due to being called by ER for bacteremia, (gram negative rod), on blood culture 07/19/2019. Pt was seen yesterday for AMS. Appears wnl today.  8/28:Patient was admitted with gram-negative rod bacteremia which now is confirmed to be Pseudomonas bacteremia with concern for infectious discitis/osteomyelitis at L3/L4. She has been started on cefepime with sensitivities still pending. Nephrology has evaluated patient with plans for hemodialysis later today. She was struggling with hypertension earlier this morning that improved with use of labetalol and continues to have some ongoing nausea with poor response to Zofran. Patient will likely require new catheter placement versus exchange soon.  8/29: Patient noted to have periods of agitation this morning with some hallucinations for which Haldol has been given.  She maintains a persistent tachycardia.  Plans for hemodialysis today per nephrology and likely catheter removal in a.m.  Continue on cefepime as patient is pansensitive.  2D echocardiogram without vegetations noted.  8/30: Patient has had brief hemodialysis on 8/29 which had to be discontinued early on account of low blood pressure readings.  She appears improved and mentation this morning and is more pleasant and less agitated, however she still having ongoing hallucinations.  EEG pending and patient will be started on Keppra this morning empirically.  Assessment & Plan:   Principal Problem:   Bacteremia Active Problems:   ESRD on dialysis [facility])   Essential hypertension   Type 2 diabetes mellitus with diabetic nephropathy (HCC)   Sepsis secondary to Pseudomonas bacteremia with discitis/osteomyelitis of L3/L4  -Noted to be pansensitive -Repeat blood cultures with no growth in the last 2 days -Continue empirically on cefepime and will plan to discuss further with ID tomorrow -Catheter removed by general surgery on 8/30 -ESR elevation noted on admission  Acute encephalopathy with hallucinations likely secondary to dementia in the setting of sepsis -Haldol ordered as needed for agitation -Started on Keppra empirically today and will check EEG to ensure no seizure activity -Continue to monitor carefully  Persistent nausea/vomiting- resolved -Zofran ordered as needed -Order vanilla Ensure at bedside to help with nutrition -We will add Phenergan to assist with improvement in symptoms  Tachycardia-persistent -TSH 1.028 -2D echocardiogram with no valvular vegetations or acute abnormalities -Likely related to ongoing hallucinations -Does not appear to have alcohol use or benzodiazepine use at home -Coreg increased to 12.5 mg twice daily  Severe hypertension-related to ongoing encephalopathy -Continue on Norvasc, carvedilol, and Imdur -Given labetalol earlier with improvement -Hydralazine ordered as needed  Type 2 diabetes -SSI  History of prior CVA -Continue aspirin and Plavix -Lipid panel pending, uncertain why patient is not on statin  ESRD on HD M/W/F with hyperkalemia-improved -Continue Rena-Vite -Appreciate nephrology evaluation with plans for further hemodialysis in the next 1 to 2 days -We will need replacement catheter prior to repeat hemodialysis   DVT prophylaxis:Heparin Code Status:Full Family Communication: Sister at bedside Disposition Plan:Continue on IV antibiotics and plan for hemodialysis today.  Will need catheter removal soon and general surgery aware for potential removal in a.m.   Consultants:  Nephrology  General surgery  Procedures:  None  Antimicrobials:   None   Subjective: Patient seen and evaluated today with ongoing  hallucinations.  She cannot complete all of her dialysis yesterday due to some hypotension.  She appears  to overall be slightly improved from yesterday.  She has had her dialysis catheter removed earlier.  Objective: Vitals:   07/22/19 2120 07/22/19 2121 07/23/19 0500 07/23/19 0536  BP: (!) 164/108 (!) 171/104  (!) 179/93  Pulse: (!) 127 (!) 126  (!) 118  Resp: (!) 22   (!) 22  Temp: 98.9 F (37.2 C)   98.2 F (36.8 C)  TempSrc: Oral   Oral  SpO2: 100%   99%  Weight:   60.3 kg   Height:        Intake/Output Summary (Last 24 hours) at 07/23/2019 1143 Last data filed at 07/22/2019 1930 Gross per 24 hour  Intake 60 ml  Output 1367 ml  Net -1307 ml   Filed Weights   07/22/19 0523 07/22/19 1430 07/23/19 0500  Weight: 62.7 kg 62.7 kg 60.3 kg    Examination:  General exam: Appears calm and comfortable, hallucinating Respiratory system: Clear to auscultation. Respiratory effort normal. Cardiovascular system: S1 & S2 heard, RRR. No JVD, murmurs, rubs, gallops or clicks. No pedal edema. Gastrointestinal system: Abdomen is nondistended, soft and nontender. No organomegaly or masses felt. Normal bowel sounds heard. Central nervous system: Actively hallucinating Extremities: Symmetric 5 x 5 power. Skin: No rashes, lesions or ulcers Psychiatry: Appears pleasant, but actively hallucinating    Data Reviewed: I have personally reviewed following labs and imaging studies  CBC: Recent Labs  Lab 07/19/19 1454 07/20/19 1445 07/21/19 0819 07/22/19 0617 07/23/19 0621  WBC 22.8* 19.1* 17.4* 14.3* 15.3*  NEUTROABS 20.7* 15.4*  --   --   --   HGB 11.5* 12.2 13.5 12.7 13.5  HCT 37.5 41.4 44.2 42.6 46.6*  MCV 90.1 92.2 88.9 92.2 91.0  PLT 213 203 230 233 191   Basic Metabolic Panel: Recent Labs  Lab 07/19/19 1336 07/20/19 1445 07/21/19 0819 07/22/19 0617 07/23/19 0621  NA 136 137 138 140 137  K 4.3 4.9 5.6* 5.1 4.8  CL 100 98 101 100 95*  CO2 25 28 20* 23 20*  GLUCOSE 145*  152* 144* 104* 171*  BUN 28* 42* 49* 58* 27*  CREATININE 4.44* 6.39* 7.00* 8.33* 4.97*  CALCIUM 8.1* 8.5* 8.7* 8.9 9.0  MG 1.6*  --   --   --   --    GFR: Estimated Creatinine Clearance: 10.3 mL/min (A) (by C-G formula based on SCr of 4.97 mg/dL (H)). Liver Function Tests: Recent Labs  Lab 07/19/19 1336 07/20/19 1445 07/21/19 0819  AST '31 16 25  ' ALT '20 16 16  ' ALKPHOS 104 116 116  BILITOT 0.6 0.5 0.6  PROT 7.4 8.3* 8.0  ALBUMIN 2.9* 3.1* 3.1*   No results for input(s): LIPASE, AMYLASE in the last 168 hours. No results for input(s): AMMONIA in the last 168 hours. Coagulation Profile: Recent Labs  Lab 07/19/19 1336 07/20/19 1445  INR 1.1 1.0   Cardiac Enzymes: No results for input(s): CKTOTAL, CKMB, CKMBINDEX, TROPONINI in the last 168 hours. BNP (last 3 results) No results for input(s): PROBNP in the last 8760 hours. HbA1C: No results for input(s): HGBA1C in the last 72 hours. CBG: Recent Labs  Lab 07/22/19 0749 07/22/19 1118 07/22/19 2126 07/23/19 0730 07/23/19 1124  GLUCAP 106* 127* 119* 168* 193*   Lipid Profile: No results for input(s): CHOL, HDL, LDLCALC, TRIG, CHOLHDL, LDLDIRECT in the last 72 hours. Thyroid Function Tests: No results for input(s): TSH, T4TOTAL, FREET4, T3FREE, THYROIDAB in the last 72 hours. Anemia Panel: No results for input(s): VITAMINB12, FOLATE, FERRITIN, TIBC,  IRON, RETICCTPCT in the last 72 hours. Sepsis Labs: Recent Labs  Lab 07/19/19 1336  LATICACIDVEN 1.2    Recent Results (from the past 240 hour(s))  Urine culture     Status: Abnormal   Collection Time: 07/19/19 12:19 PM   Specimen: Urine, Catheterized  Result Value Ref Range Status   Specimen Description   Final    URINE, CATHETERIZED Performed at [facility], [address] [place], [place] [address]    Special Requests   Final    Normal Performed at [facility], [address], [place], [place] [address]    Culture (A)  Final    <10,000 COLONIES/mL  INSIGNIFICANT GROWTH Performed at [place] [address], [place], [place] [address]    Report Status 07/20/2019 FINAL  Final  Culture, blood (Routine x 2)     Status: Abnormal   Collection Time: 07/19/19  1:36 PM   Specimen: BLOOD RIGHT ARM  Result Value Ref Range Status   Specimen Description   Final    BLOOD RIGHT ARM Performed at [facility], [address] [place], [place] [address]    Special Requests   Final    BOTTLES DRAWN AEROBIC ONLY Blood Culture adequate volume Performed at [facility], [address], [place], [place] [address]    Culture  Setup Time   Final    GRAM NEGATIVE RODS AEROBIC BOTTLE ONLY Gram Stain Report Called to,Read Back By and Verified With: HARTLEY,A'@[facility]'  @ 1100 BY MATTHEWS, B 8.27.2020 [facility] Organism ID to follow CRITICAL RESULT CALLED TO, READ BACK BY AND VERIFIED WITH: Marissa Nestle RN 07/20/19 1905 Performed at [facility], [place] [address] [place], [place] [address]    Culture PSEUDOMONAS AERUGINOSA (A)  Final   Report Status 07/22/2019 FINAL  Final   Organism ID, Bacteria PSEUDOMONAS AERUGINOSA  Final      Susceptibility   Pseudomonas aeruginosa - MIC*    CEFTAZIDIME 2 SENSITIVE Sensitive     CIPROFLOXACIN <=0.25 SENSITIVE Sensitive     GENTAMICIN <=1 SENSITIVE Sensitive     IMIPENEM 2 SENSITIVE Sensitive     PIP/TAZO <=4 SENSITIVE Sensitive     CEFEPIME <=1 SENSITIVE Sensitive     * PSEUDOMONAS AERUGINOSA  Culture, blood (Routine x 2)     Status: Abnormal   Collection Time: 07/19/19  1:36 PM   Specimen: BLOOD RIGHT HAND  Result Value Ref Range Status   Specimen Description   Final    BLOOD RIGHT HAND Performed at [facility], [address] [place], [place] [address]    Special Requests   Final    AEROBIC BOTTLE ONLY Blood Culture adequate volume Performed at [facility], [address], [place], [place] [address]    Culture  Setup Time   Final    AEROBIC BOTTLE ONLY GRAM NEGATIVE RODS CRITICAL VALUE  NOTED.  VALUE IS CONSISTENT WITH PREVIOUSLY REPORTED AND CALLED VALUE.    Culture (A)  Final    PSEUDOMONAS AERUGINOSA SUSCEPTIBILITIES PERFORMED ON PREVIOUS CULTURE WITHIN THE LAST 5 DAYS. Performed at [facility], [place] [address], [place],  [address]    Report Status 07/22/2019 FINAL  Final  Blood Culture ID Panel (Reflexed)     Status: Abnormal   Collection Time: 07/19/19  1:36 PM  Result Value Ref Range Status   Enterococcus species NOT DETECTED NOT DETECTED Final   Listeria monocytogenes NOT DETECTED NOT DETECTED Final   Staphylococcus species NOT DETECTED NOT  DETECTED Final   Staphylococcus aureus (BCID) NOT DETECTED NOT DETECTED Final   Streptococcus species NOT DETECTED NOT DETECTED Final   Streptococcus agalactiae NOT DETECTED NOT DETECTED Final   Streptococcus pneumoniae NOT DETECTED NOT DETECTED Final   Streptococcus pyogenes NOT DETECTED NOT DETECTED Final   Acinetobacter baumannii NOT DETECTED NOT DETECTED Final   Enterobacteriaceae species NOT DETECTED NOT DETECTED Final   Enterobacter cloacae complex NOT DETECTED NOT DETECTED Final   Escherichia coli NOT DETECTED NOT DETECTED Final   Klebsiella oxytoca NOT DETECTED NOT DETECTED Final   Klebsiella pneumoniae NOT DETECTED NOT DETECTED Final   Proteus species NOT DETECTED NOT DETECTED Final   Serratia marcescens NOT DETECTED NOT DETECTED Final   Carbapenem resistance NOT DETECTED NOT DETECTED Final   Haemophilus influenzae NOT DETECTED NOT DETECTED Final   Neisseria meningitidis NOT DETECTED NOT DETECTED Final   Pseudomonas aeruginosa DETECTED (A) NOT DETECTED Final    Comment: CRITICAL RESULT CALLED TO, READ BACK BY AND VERIFIED WITH: Marissa Nestle RN 07/20/19 1905 JDW    Candida albicans NOT DETECTED NOT DETECTED Final   Candida glabrata NOT DETECTED NOT DETECTED Final   Candida krusei NOT DETECTED NOT DETECTED Final   Candida parapsilosis NOT DETECTED NOT DETECTED Final   Candida tropicalis NOT DETECTED  NOT DETECTED Final    Comment: Performed at [facility], [place]. [address], [place], [place] [address]  Culture, blood (Routine x 2)     Status: None (Preliminary result)   Collection Time: 07/20/19  2:45 PM   Specimen: BLOOD LEFT HAND  Result Value Ref Range Status   Specimen Description BLOOD LEFT HAND  Final   Special Requests   Final    BOTTLES DRAWN AEROBIC ONLY Blood Culture adequate volume   Culture   Final    NO GROWTH 2 DAYS Performed at [facility], [address] [place], [place] [address]    Report Status PENDING  Incomplete  Culture, blood (Routine x 2)     Status: None (Preliminary result)   Collection Time: 07/20/19  2:45 PM   Specimen: BLOOD RIGHT HAND  Result Value Ref Range Status   Specimen Description BLOOD RIGHT HAND  Final   Special Requests   Final    BOTTLES DRAWN AEROBIC ONLY Blood Culture adequate volume   Culture   Final    NO GROWTH 2 DAYS Performed at [facility], [address], [place], [place] [address]    Report Status PENDING  Incomplete  SARS Coronavirus 2 [facility] order, Performed in [place] hospital lab) Nasopharyngeal Nasopharyngeal Swab     Status: None   Collection Time: 07/20/19  5:11 PM   Specimen: Nasopharyngeal Swab  Result Value Ref Range Status   SARS Coronavirus 2 NEGATIVE NEGATIVE Final    Comment: (NOTE) If result is NEGATIVE SARS-CoV-2 target nucleic acids are NOT DETECTED. The SARS-CoV-2 RNA is generally detectable in upper and lower  respiratory specimens during the acute phase of infection. The lowest  concentration of SARS-CoV-2 viral copies this assay can detect is 250  copies / mL. A negative result does not preclude SARS-CoV-2 infection  and should not be used as the sole basis for treatment or other  patient management decisions.  A negative result may occur with  improper specimen collection / handling, submission of specimen other  than nasopharyngeal swab, presence of viral mutation(s) within the  areas  targeted by this assay, and inadequate number of viral copies  (<250 copies / mL). A  negative result must be combined with clinical  observations, patient history, and epidemiological information. If result is POSITIVE SARS-CoV-2 target nucleic acids are DETECTED. The SARS-CoV-2 RNA is generally detectable in upper and lower  respiratory specimens dur ing the acute phase of infection.  Positive  results are indicative of active infection with SARS-CoV-2.  Clinical  correlation with patient history and other diagnostic information is  necessary to determine patient infection status.  Positive results do  not rule out bacterial infection or co-infection with other viruses. If result is PRESUMPTIVE POSTIVE SARS-CoV-2 nucleic acids MAY BE PRESENT.   A presumptive positive result was obtained on the submitted specimen  and confirmed on repeat testing.  While 2019 novel coronavirus  (SARS-CoV-2) nucleic acids may be present in the submitted sample  additional confirmatory testing may be necessary for epidemiological  and / or clinical management purposes  to differentiate between  SARS-CoV-2 and other Sarbecovirus currently known to infect humans.  If clinically indicated additional testing with an alternate test  methodology [phone]) is advised. The SARS-CoV-2 RNA is generally  detectable in upper and lower respiratory sp ecimens during the acute  phase of infection. The expected result is Negative. Fact Sheet for Patients:  StrictlyIdeas.no Fact Sheet for Healthcare Providers: BankingDealers.co.za This test is not yet approved or cleared by the [place] FDA and has been authorized for detection and/or diagnosis of SARS-CoV-2 by FDA under an Emergency Use Authorization (EUA).  This EUA will remain in effect (meaning this test can be used) for the duration of the COVID-19 declaration under Section 564(b)(1) of the Act, 21 U.S.C. section  360bbb-3(b)(1), unless the authorization is terminated or revoked sooner. Performed at [facility], [address], [place], [place] [address]          Radiology Studies: No results found.      Scheduled Meds: . amLODipine  10 mg Oral QPC lunch  . aspirin EC  81 mg Oral QPC lunch  . calcium carbonate  1 tablet Oral BID  . carvedilol  12.5 mg Oral BID  . Chlorhexidine Gluconate Cloth  6 each Topical Q0600  . clopidogrel  75 mg Oral Daily  . feeding supplement (NEPRO CARB STEADY)  237 mL Oral BID BM  . heparin  5,000 Units Subcutaneous Q8H  . insulin aspart  0-5 Units Subcutaneous QHS  . insulin aspart  0-9 Units Subcutaneous TID WC  . isosorbide mononitrate  30 mg Oral QPC lunch  . multivitamin  1 tablet Oral QPC lunch  . sodium chloride flush  3 mL Intravenous Q12H   Continuous Infusions: . sodium chloride    . sodium chloride    . sodium chloride    . ceFEPime (MAXIPIME) IV    . levETIRAcetam 500 mg (07/23/19 0836)     LOS: 3 days    Time spent: 30 minutes    Korena Nass Darleen Crocker, DO [facility] Pager [phone]  If 7PM-7AM, please contact night-coverage [email or web address] Password [email or web address] 07/23/2019, 11:43 AM

## 2019-07-24 ENCOUNTER — Inpatient Hospital Stay (HOSPITAL_COMMUNITY)
Admit: 2019-07-24 | Discharge: 2019-07-24 | Disposition: A | Discharge status: Home / Self Care | Payer: Medicare PPO | Attending: Internal Medicine | Admitting: Internal Medicine

## 2019-07-24 DIAGNOSIS — Z992 Dependence on renal dialysis: Secondary | ICD-10-CM | POA: Diagnosis not present

## 2019-07-24 DIAGNOSIS — N186 End stage renal disease: Secondary | ICD-10-CM | POA: Diagnosis not present

## 2019-07-24 LAB — CBC
HCT: 47 % — ABNORMAL HIGH (ref 36.0–46.0)
Hemoglobin: 13.9 g/dL (ref 12.0–15.0)
MCH: 27.3 pg (ref 26.0–34.0)
MCHC: 29.6 g/dL — ABNORMAL LOW (ref 30.0–36.0)
MCV: 92.2 fL (ref 80.0–100.0)
Platelets: 250 10*3/uL (ref 150–400)
RBC: 5.1 MIL/uL (ref 3.87–5.11)
RDW: 16.1 % — ABNORMAL HIGH (ref 11.5–15.5)
WBC: 11.5 10*3/uL — ABNORMAL HIGH (ref 4.0–10.5)
nRBC: 0 % (ref 0.0–0.2)

## 2019-07-24 LAB — BASIC METABOLIC PANEL
Anion gap: 14 (ref 5–15)
BUN: 47 mg/dL — ABNORMAL HIGH (ref 8–23)
CO2: 26 mmol/L (ref 22–32)
Calcium: 8.8 mg/dL — ABNORMAL LOW (ref 8.9–10.3)
Chloride: 100 mmol/L (ref 98–111)
Creatinine, Ser: 7.08 mg/dL — ABNORMAL HIGH (ref 0.44–1.00)
GFR calc Af Amer: 6 mL/min — ABNORMAL LOW (ref >60)
GFR calc non Af Amer: 5 mL/min — ABNORMAL LOW (ref >60)
Glucose, Bld: 129 mg/dL — ABNORMAL HIGH (ref 70–99)
Potassium: 4.5 mmol/L (ref 3.5–5.1)
Sodium: 140 mmol/L (ref 135–145)

## 2019-07-24 LAB — GLUCOSE, CAPILLARY
Glucose-Capillary: 154 mg/dL — ABNORMAL HIGH (ref 70–99)
Glucose-Capillary: 135 mg/dL — ABNORMAL HIGH (ref 70–99)
Glucose-Capillary: 194 mg/dL — ABNORMAL HIGH (ref 70–99)

## 2019-07-24 MED ORDER — CARVEDILOL 3.125 MG PO TABS
6.2500 mg | ORAL_TABLET | Freq: Two times a day (BID) | ORAL | Status: DC
  Administered 2019-07-25 – 2019-07-26 (×2): 6.25 mg via ORAL
  Filled 2019-07-24 (×2): qty 2

## 2019-07-24 MED ORDER — SODIUM CHLORIDE 0.9 % IV SOLN
1.0000 g | INTRAVENOUS | Status: DC
  Filled 2019-07-24 (×3): qty 1

## 2019-07-24 MED ORDER — CLOPIDOGREL BISULFATE 75 MG PO TABS
75.0000 mg | ORAL_TABLET | Freq: Every day | ORAL | Status: DC
  Administered 2019-07-26: 09:00:00 75 mg via ORAL
  Filled 2019-07-24 (×2): qty 1

## 2019-07-24 MED ORDER — CHLORHEXIDINE GLUCONATE CLOTH 2 % EX PADS: 6.0000 | MEDICATED_PAD | Freq: Once | CUTANEOUS | Status: AC

## 2019-07-24 MED ORDER — CHLORHEXIDINE GLUCONATE CLOTH 2 % EX PADS
6.0000 | MEDICATED_PAD | Freq: Once | CUTANEOUS | Status: AC
  Administered 2019-07-24: 23:00:00 6 via TOPICAL

## 2019-07-24 NOTE — H&P (View-Only) (Signed)
[facility] Consult  Reason for Consult: Dialysis catheter access Referring Physician:  Dr. Marval Regal   Chief Complaint    Abnormal Lab      HPI: [name] is a 68 y.o. female with a history of HTN, HLD, CVA, ESRD on HD MWF via prior right IJ tunneled catheter who presented to the hospital a few days ago with bacteremia with pseudomonas possibly from osteomyelitis of the lumbar spine.  She had her prior dialysis catheter placed by Dr. Donnetta Hutching 09/2018 and a fistula performed on the left arm. The fistulas have been revised and continue to clot, and the patient was placed on Plavix to aid with this issue. She has not had dialysis via a fistula due to these issues, and had a venogram with vascular 10/2018 with no overt central stenosis but a stagnant system on the left, and a brisk open system on the right.  She is being evaluated for possible future right AVF and also reported to be considering peritoneal dialysis per her son, [name].   The patient has been confused during her admission, and [name] is her POA.  She is oriented today.    Past Medical History:  Diagnosis Date  . Arthritis    hands  . Constipation   . Diabetes mellitus without complication (HCC)    Type II  . ESRD (end stage renal disease) ([place])     M/W/F- Hemodialysis  . GERD (gastroesophageal reflux disease)   . Headache   . Hyperlipidemia   . Hypertension   . Irregular heart rate   . Stroke [facility])    TIA - approx 2010- no residual    Past Surgical History:  Procedure Laterality Date  . ABDOMINAL HYSTERECTOMY    . AORTIC ARCH ANGIOGRAPHY N/A 03/28/2019   Procedure: AORTIC ARCH ANGIOGRAPHY;  Surgeon: Waynetta Sandy, MD;  Location: [place] [facility];  Service: Cardiovascular;  Laterality: N/A;  . AV FISTULA PLACEMENT Left 06/13/2018   Procedure: ARTERIOVENOUS (AV) FISTULA CREATION;  Surgeon: Rosetta Posner, MD;  Location: [place];  Service: Vascular;  Laterality: Left;  . AV FISTULA  PLACEMENT Left 08/18/2018   Procedure: INSERTION OF 4-7MM X 45CM ARTERIOVENOUS (AV) GORE-TEX GRAFT LEFT  FOREARM;  Surgeon: Rosetta Posner, MD;  Location: [place];  Service: Vascular;  Laterality: Left;  . AV FISTULA PLACEMENT Right 04/06/2019   Procedure: INSERTION OF ARTERIOVENOUS (AV) GORE-TEX GRAFT RIGHT UPPER ARM;  Surgeon: Waynetta Sandy, MD;  Location: [place];  Service: Vascular;  Laterality: Right;  . AV FISTULA PLACEMENT Right 04/13/2019   Procedure: INSERTION OF ARTERIOVENOUS (AV) BOVINE  ARTEGRAFT GRAFT RIGHT UPPER EXTREMITY;  Surgeon: Serafina Mitchell, MD;  Location: [place];  Service: Vascular;  Laterality: Right;  . Day REMOVAL Right 05/16/2019   Procedure: REMOVAL OF ARTERIOVENOUS GORETEX GRAFT ([place]) RIGHT ARM;  Surgeon: Angelia Mould, MD;  Location: [place];  Service: Vascular;  Laterality: Right;  . BASCILIC VEIN TRANSPOSITION Right 03/07/2019   Procedure: First Stage Bascilic Vein Transposition Right Arm;  Surgeon: Serafina Mitchell, MD;  Location: [place];  Service: Vascular;  Laterality: Right;  . CATARACT EXTRACTION Right 2005  . INSERTION OF DIALYSIS CATHETER N/A 08/18/2018   Procedure: INSERTION OF DIALYSIS CATHETER;  Surgeon: Rosetta Posner, MD;  Location: [place];  Service: Vascular;  Laterality: N/A;  . IR FLUORO GUIDE CV LINE RIGHT  12/06/2018  . IR PTA ADDL CENTRAL DIALYSIS SEG THRU DIALY CIRCUIT RIGHT Right 12/06/2018  . THROMBECTOMY AND  REVISION OF ARTERIOVENTOUS (AV) GORETEX  GRAFT Left 09/29/2018   Procedure: INSERTION OF ARTERIOVENTOUS (AV) GORETEX  GRAFT ARM;  Surgeon: Waynetta Sandy, MD;  Location: [place];  Service: Vascular;  Laterality: Left;  . UPPER EXTREMITY ANGIOGRAPHY Right 03/28/2019   Procedure: UPPER EXTREMITY ANGIOGRAPHY;  Surgeon: Waynetta Sandy, MD;  Location: [place] [facility];  Service: Cardiovascular;  Laterality: Right;  . VENOGRAM  10/27/2018   Procedure: Venogram;  Surgeon: Marty Heck, MD;  Location: [place] [facility]  [facility];  Service: Cardiovascular;;  bilateral arm    Family History  Problem Relation Age of Onset  . Heart murmur Mother   . Heart failure Mother   . Diabetes Mother   . Cirrhosis Father   . Alcoholism Father     Social History   Tobacco Use  . Smoking status: Never Smoker  . Smokeless tobacco: Never Used  Substance Use Topics  . Alcohol use: Never    Frequency: Never  . Drug use: Never    Medications:  I have reviewed the patient's current medications. Prior to Admission:  Medications Prior to Admission  Medication Sig Dispense Refill Last Dose  . amLODipine (NORVASC) 10 MG tablet Take 10 mg by mouth daily after lunch.    07/19/2019 at Unknown time  . aspirin EC 81 MG tablet Take 81 mg by mouth daily after lunch.    07/19/2019 at Unknown time  . calcium carbonate (TUMS - DOSED IN MG ELEMENTAL CALCIUM) 500 MG chewable tablet Chew 1 tablet by mouth 2 (two) times daily. With lunch & supper   07/19/2019 at Unknown time  . carvedilol (COREG) 6.25 MG tablet Take 6.25 mg by mouth 2 (two) times daily.   07/19/2019 at 2030  . clopidogrel (PLAVIX) 75 MG tablet Take 1 tablet (75 mg total) by mouth daily. 30 tablet 11 07/19/2019 at 2030  . fluticasone (FLONASE) 50 MCG/ACT nasal spray Place 1 spray into both nostrils daily as needed for allergies or rhinitis.    Past Week at Unknown time  . insulin NPH-regular Human (NOVOLIN 70/30) (70-30) 100 UNIT/ML injection Inject 10-20 Units into the skin See admin instructions. Use twice daily per sliding scale: Blood sugar less than 150=12 units & Blood sugar 200 or more=20 units   07/19/2019 at Unknown time  . isosorbide mononitrate (IMDUR) 30 MG 24 hr tablet Take 30 mg by mouth daily after lunch.    07/19/2019 at Unknown time  . multivitamin (RENA-VIT) TABS tablet Take 1 tablet by mouth daily after lunch.    07/19/2019 at Unknown time  . predniSONE (DELTASONE) 10 MG tablet Take 10-20 mg by mouth See admin instructions. Starting on 07/13/2019 take 20mg  for 5  days, then take 10mg  until all taken as directed.   07/18/2019  . sulfamethoxazole-trimethoprim (BACTRIM) 400-80 MG tablet Take 1 tablet by mouth daily. On dialysis days, take after your dialysis treatment. (Patient not taking: Reported on 07/20/2019) 14 tablet 0 Not Taking at Unknown time   Scheduled: . amLODipine  10 mg Oral QPC lunch  . aspirin EC  81 mg Oral QPC lunch  . calcium carbonate  1 tablet Oral BID  . carvedilol  6.25 mg Oral BID  . Chlorhexidine Gluconate Cloth  6 each Topical Q0600  . [START ON 07/25/2019] clopidogrel  75 mg Oral Daily  . feeding supplement (NEPRO CARB STEADY)  237 mL Oral BID BM  . heparin  5,000 Units Subcutaneous Q8H  . insulin aspart  0-5 Units Subcutaneous QHS  .  insulin aspart  0-9 Units Subcutaneous TID WC  . isosorbide mononitrate  30 mg Oral QPC lunch  . multivitamin  1 tablet Oral QPC lunch  . sodium chloride flush  3 mL Intravenous Q12H   Continuous: . sodium chloride    . sodium chloride    . sodium chloride    . cefTAZidime (FORTAZ)  IV    . levETIRAcetam 500 mg (07/24/19 1007)   [id number] chloride, sodium chloride, sodium chloride, acetaminophen **OR** acetaminophen, alum & mag hydroxide-simeth, fluticasone, haloperidol lactate, heparin, hydrALAZINE, HYDROmorphone (DILAUDID) injection, ondansetron (ZOFRAN) IV, promethazine, sodium chloride flush  No Known Allergies   ROS:  A comprehensive review of systems was negative except for: Musculoskeletal: positive for back pain  Blood pressure 122/84, pulse 79, temperature 98.3 F (36.8 C), temperature source Oral, resp. rate 18, height 5\' 7"  (1.702 m), weight 60.8 kg, SpO2 100 %. Physical Exam Vitals signs reviewed.  Constitutional:      Appearance: Normal appearance.  HENT:     Head: Normocephalic.     Nose: Nose normal.     Mouth/Throat:     Mouth: Mucous membranes are moist.  Eyes:     Extraocular Movements: Extraocular movements intact.  Neck:     Musculoskeletal: Normal range of  motion.  Cardiovascular:     Rate and Rhythm: Normal rate and regular rhythm.  Pulmonary:     Effort: Pulmonary effort is normal.     Breath sounds: Normal breath sounds.  Chest:     Comments: Bandage from prior RIJ tunneled line in place Abdominal:     General: There is no distension.     Palpations: Abdomen is soft.     Tenderness: There is no abdominal tenderness.  Musculoskeletal: Normal range of motion.        General: No swelling.  Skin:    General: Skin is warm and dry.  Neurological:     General: No focal deficit present.     Mental Status: She is alert and oriented to person, place, and time.  Psychiatric:        Mood and Affect: Mood normal.        Behavior: Behavior normal.        Thought Content: Thought content normal.     Results: Results for orders placed or performed during the hospital encounter of 07/20/19 (from the past 48 hour(s))  Glucose, capillary     Status: Abnormal   Collection Time: 07/22/19 11:18 AM  Result Value Ref Range   Glucose-Capillary 127 (H) 70 - 99 mg/dL  Glucose, capillary     Status: Abnormal   Collection Time: 07/22/19  9:26 PM  Result Value Ref Range   Glucose-Capillary 119 (H) 70 - 99 mg/dL   Comment 1 Notify RN    Comment 2 Document in Chart   Basic metabolic panel     Status: Abnormal   Collection Time: 07/23/19  6:21 AM  Result Value Ref Range   Sodium 137 135 - 145 mmol/L   Potassium 4.8 3.5 - 5.1 mmol/L   Chloride 95 (L) 98 - 111 mmol/L   CO2 20 (L) 22 - 32 mmol/L   Glucose, Bld 171 (H) 70 - 99 mg/dL   BUN 27 (H) 8 - 23 mg/dL   Creatinine, Ser 4.97 (H) 0.44 - 1.00 mg/dL   Calcium 9.0 8.9 - 10.3 mg/dL   GFR calc non Af Amer 8 (L) >60 mL/min   GFR calc Af Amer 10 (L) >60 mL/min  Anion gap 22 (H) 5 - 15    Comment: Performed at [facility], [address] [place], [place] [address]  CBC     Status: Abnormal   Collection Time: 07/23/19  6:21 AM  Result Value Ref Range   WBC 15.3 (H) 4.0 - 10.5 K/uL   RBC 5.12 (H)  3.87 - 5.11 MIL/uL   Hemoglobin 13.5 12.0 - 15.0 g/dL   HCT 46.6 (H) 36.0 - 46.0 %   MCV 91.0 80.0 - 100.0 fL   MCH 26.4 26.0 - 34.0 pg   MCHC 29.0 (L) 30.0 - 36.0 g/dL   RDW 16.3 (H) 11.5 - 15.5 %   Platelets 277 150 - 400 K/uL   nRBC 0.0 0.0 - 0.2 %    Comment: Performed at [facility], [address] [place], [place] [address]  Glucose, capillary     Status: Abnormal   Collection Time: 07/23/19  7:30 AM  Result Value Ref Range   Glucose-Capillary 168 (H) 70 - 99 mg/dL  Aerobic Culture (superficial specimen)     Status: None (Preliminary result)   Collection Time: 07/23/19  9:31 AM   Specimen: Catheter Tip  Result Value Ref Range   Specimen Description      CATH TIP Performed at [facility], [address] [place], [place] [address]    Special Requests      Immunocompromised Performed at [facility], [address], [place], [place] [address]    Gram Stain      NO WBC SEEN NO ORGANISMS SEEN Performed at [facility], [place] [address] [place], [place] [address]    Culture PENDING    Report Status PENDING   Glucose, capillary     Status: Abnormal   Collection Time: 07/23/19 11:24 AM  Result Value Ref Range   Glucose-Capillary 193 (H) 70 - 99 mg/dL  Glucose, capillary     Status: Abnormal   Collection Time: 07/23/19  4:15 PM  Result Value Ref Range   Glucose-Capillary 274 (H) 70 - 99 mg/dL  Glucose, capillary     Status: Abnormal   Collection Time: 07/23/19 10:51 PM  Result Value Ref Range   Glucose-Capillary 104 (H) 70 - 99 mg/dL   Comment 1 Notify RN    Comment 2 Document in Chart   Basic metabolic panel     Status: Abnormal   Collection Time: 07/24/19  5:22 AM  Result Value Ref Range   Sodium 140 135 - 145 mmol/L   Potassium 4.5 3.5 - 5.1 mmol/L   Chloride 100 98 - 111 mmol/L   CO2 26 22 - 32 mmol/L   Glucose, Bld 129 (H) 70 - 99 mg/dL   BUN 47 (H) 8 - 23 mg/dL   Creatinine, Ser 7.08 (H) 0.44 - 1.00 mg/dL   Calcium 8.8 (L) 8.9 - 10.3 mg/dL   GFR  calc non Af Amer 5 (L) >60 mL/min   GFR calc Af Amer 6 (L) >60 mL/min   Anion gap 14 5 - 15    Comment: Performed at [facility], [address] [place], [place] [address]  CBC     Status: Abnormal   Collection Time: 07/24/19  5:22 AM  Result Value Ref Range   WBC 11.5 (H) 4.0 - 10.5 K/uL   RBC 5.10 3.87 - 5.11 MIL/uL   Hemoglobin 13.9 12.0 - 15.0 g/dL   HCT 47.0 (H) 36.0 - 46.0 %   MCV 92.2 80.0 - 100.0 fL  MCH 27.3 26.0 - 34.0 pg   MCHC 29.6 (L) 30.0 - 36.0 g/dL   RDW 16.1 (H) 11.5 - 15.5 %   Platelets 250 150 - 400 K/uL   nRBC 0.0 0.0 - 0.2 %    Comment: Performed at [facility], [address], [place], [place] [address]  Glucose, capillary     Status: Abnormal   Collection Time: 07/24/19  8:04 AM  Result Value Ref Range   Glucose-Capillary 154 (H) 70 - 99 mg/dL    Personally reviewed CXR 8/27 with possible atelectasis/ infiltrate, line in place   EKG with sinus rhythm 8/27   Assessment & Plan:  Pamala Hitson is a 68 y.o. female with end stage renal disease in the need of dialysis access for longterm dialysis. She has had prior left AVF and AVG that have clotted, and is on Plavix due to this issue. She needs a new line and I have been asked to place a tunneled catheter. I discussed the risk of the catheter with her and her son, and the risk of additional bleeding, hematoma due to the Plavix.  We discussed the concern for possible narrowing/ stenosis on the left and will plan for a right sided catheter. We discussed the risk of infection, and the risk of injury to the other vascular structures, especially given her prior procedures on the neck on the right.  Discussed option of femoral line and holding Plavix, but risk of infection, still risk of bleeding, and delay in definitive care with this plan. The son would like to proceed with the tunneled catheter.   -Tunneled dialysis catheter - will do right sided -NPO midnight -Receiving bacteremia treatment now  -Discussed with  Dr. Marval Regal and Dr. Manuella Ghazi.   All questions were answered to the satisfaction of the patient and family.   Virl Cagey 07/24/2019, 11:00 AM

## 2019-07-24 NOTE — Progress Notes (Signed)
Pharmacy Antibiotic Note  [name] is a 68 y.o. female admitted on 07/20/2019 with gram negative rod bacteremia.  Pharmacy has been consulted for Ceftazidime dosing.   Plan: Ceftazidime 1000 mg IV every 24 hours. Can consider change to ceftazidime 2000 mg IV every MWF w/ HD on discharge. Monitor labs, c/s, and patient improvement.   Height: 5\' 7"  (170.2 cm) Weight: 134 lb 0.6 oz (60.8 kg) IBW/kg (Calculated) : 61.6  Temp (24hrs), Avg:97.8 F (36.6 C), Min:97.5 F (36.4 C), Max:98.3 F (36.8 C)  Recent Labs  Lab 07/19/19 1336  07/20/19 1445 07/21/19 0819 07/22/19 0617 07/23/19 0621 07/24/19 0522  WBC  --    < > 19.1* 17.4* 14.3* 15.3* 11.5*  CREATININE 4.44*  --  6.39* 7.00* 8.33* 4.97* 7.08*  LATICACIDVEN 1.2  --   --   --   --   --   --    < > = values in this interval not displayed.    Estimated Creatinine Clearance: 7.3 mL/min (A) (by C-G formula based on SCr of 7.08 mg/dL (H)).    No Known Allergies  Antimicrobials this admission: Cefepime 8/27 >> 8/31 Ceftazidime 8/31 >>   Dose adjustments this admission: Cefepime/ceftazidime  Microbiology results: 8/26 BCx: pseudomonas 8/27 BCx: ngtd   Thank you for allowing pharmacy to be a part of this patient's care.  Ramond Craver 07/24/2019 11:17 AM

## 2019-07-24 NOTE — TOC Initial Note (Signed)
Transition of Care [facility]) - Initial/Assessment Note    Patient Details  Name: [name] MRN: [id number] Date of Birth: 05/28/1951  [facility] CM/SW Contact:    Shade Flood, LCSW Phone Number: 07/24/2019, 11:45 AM  Clinical Narrative:                  Pt admitted from home. She receives outpatient HD. Pt established with a PCP and obtains medications without difficulty.   Per MD, pt will need IV cefapime at dc. She should be able to get this with her dialysis per MD. Pt to get a new tunnel catheter tomorrow at [facility] and then return to [facility].   TOC will follow and assist as needed.  Expected Discharge Plan: Home/Self Care Barriers to Discharge: Continued Medical Work up   Patient Goals and CMS Choice        Expected Discharge Plan and Services Expected Discharge Plan: Home/Self Care       Living arrangements for the past 2 months: Single Family Home                                      Prior Living Arrangements/Services Living arrangements for the past 2 months: Single Family Home Lives with:: Self Patient language and need for interpreter reviewed:: Yes Do you feel safe going back to the place where you live?: Yes      Need for Family Participation in Patient Care: No (Comment) Care giver support system in place?: Yes (comment)   Criminal Activity/Legal Involvement Pertinent to Current Situation/Hospitalization: No - Comment as needed  Activities of Daily Living Home Assistive Devices/Equipment: None ADL Screening (condition at time of admission) Patient's cognitive ability adequate to safely complete daily activities?: Yes Is the patient deaf or have difficulty hearing?: No Does the patient have difficulty seeing, even when wearing glasses/contacts?: No Does the patient have difficulty concentrating, remembering, or making decisions?: No Patient able to express need for assistance with ADLs?: Yes Does the patient have difficulty  dressing or bathing?: No Independently performs ADLs?: Yes (appropriate for developmental age) Does the patient have difficulty walking or climbing stairs?: Yes Weakness of Legs: None Weakness of Arms/Hands: None  Permission Sought/Granted                  Emotional Assessment Appearance:: Appears stated age Attitude/Demeanor/Rapport: Engaged Affect (typically observed): Pleasant Orientation: : Oriented to Self, Oriented to Place, Oriented to  Time, Oriented to Situation Alcohol / Substance Use: Not Applicable Psych Involvement: No (comment)  Admission diagnosis:  Bacteremia [R78.81] Patient Active Problem List   Diagnosis Date Noted  . Bacteremia 07/20/2019  . ESRD on dialysis ([place]) 05/15/2019  . Anemia of chronic renal failure 04/03/2018  . Essential hypertension 04/03/2018  . Type 2 diabetes mellitus with diabetic nephropathy ([place]) 04/03/2018   PCP:  Manon Hilding, MD Pharmacy:   [place], [place] [address] Phone: [phone] Fax: [phone]     Social Determinants of Health (SDOH) Interventions    Readmission Risk Interventions Readmission Risk Prevention Plan 07/24/2019  Transportation Screening Complete  Palliative Care Screening Not Applicable  Medication Review (RN Care Manager) Complete  Some recent data might be hidden

## 2019-07-24 NOTE — Progress Notes (Signed)
Telemetry called patient is having missed beats 1.5 sec or less.  MD made aware

## 2019-07-24 NOTE — Progress Notes (Addendum)
Patient ID: [name], female   DOB: [birth year], 68 y.o.   MRN: [id number]   Pt is scheduled at [facility] IR for tunneled dialysis catheter placement 07/25/19. Orders in place for pt to be at [facility] tomorrow via ambulance (RN to schedule ambulance) Pt will return to [facility] after procedure  Pt is on Plavix-- discussing with MD She has no access now.  Addendum IR placement has been canceled per Dr Marval Regal Pt is for [facility] tomorrow

## 2019-07-24 NOTE — Care Management Important Message (Signed)
Important Message  Patient Details  Name: [name] MRN: [id number] Date of Birth: Aug 02, 1951   Medicare Important Message Given:  Yes     Tommy Medal 07/24/2019, 3:31 PM

## 2019-07-24 NOTE — Progress Notes (Signed)
PROGRESS NOTE    Jayce Boyko  NWG:[id number] DOB: [birth year] DOA: 07/20/2019 PCP: Manon Hilding, MD   Brief Narrative:  Per HPI: [name] a67 y.o.female,w hypertension, hyperlipidemia, Dm2 w ESRD on HD (M,W, F), h/o CVA, Jerrye Bushy, apparently presents due to being called by ER for bacteremia, (gram negative rod), on blood culture 07/19/2019. Pt was seen yesterday for AMS. Appears wnl today.  8/28:Patient was admitted with gram-negative rod bacteremia which now is confirmed to be Pseudomonas bacteremia with concern for infectious discitis/osteomyelitis at L3/L4. She has been started on cefepime with sensitivities still pending. Nephrology has evaluated patient with plans for hemodialysis later today. She was struggling with hypertension earlier this morning that improved with use of labetalol and continues to have some ongoing nausea with poor response to Zofran. Patient will likely require new catheter placement versus exchange soon.  8/29:Patient noted to have periods of agitation this morning with some hallucinations for which Haldol has been given. She maintains a persistent tachycardia. Plans for hemodialysis today per nephrology and likely catheter removal in a.m. Continue on cefepime as patient is pansensitive. 2D echocardiogram without vegetations noted.  8/30: Patient has had brief hemodialysis on 8/29 which had to be discontinued early on account of low blood pressure readings.  She appears improved and mentation this morning and is more pleasant and less agitated, however she still having ongoing hallucinations.  EEG pending and patient will be started on Keppra this morning empirically.  8/31: Patient is overall much improved this morning.  EEG still pending.  Plans for placement of tunneled dialysis catheter in a.m. per general surgery.  Discussed case with ID who recommends 6 weeks of IV antibiotics with ceftazidime after hemodialysis sessions.  Likely  can be discharged in a.m. if stable.   Assessment & Plan:   Principal Problem:   Bacteremia Active Problems:   ESRD on dialysis [facility])   Essential hypertension   Type 2 diabetes mellitus with diabetic nephropathy (HCC)   Sepsis secondary to Pseudomonas bacteremia with discitis/osteomyelitis of L3/L4-improving -Noted to be pansensitive -Repeat blood cultures with no growth in the last 4 days -Switched to ceftazidime per ID recommendations which patient can continue after dialysis in outpatient setting-will need 6 weeks of IV antibiotics and will follow up with ID outpatient -Will need repeat lumbar MRI in 3 to 4 weeks to document stability -Catheter removed by general surgery on 8/30, catheter tip with no growth noted -ESR elevation notedon admission  Acute encephalopathy with hallucinations likely secondary to dementia in the setting of sepsis-resolved -Haldol ordered as needed for agitation -Started on Keppra empirically today and will check EEG to ensure no seizure activity prior to discharge -Continue to monitor carefully  Persistent nausea/vomiting- resolved -Zofran ordered as needed -Appetite is improving this morning -Order vanilla Ensure at bedside to help with nutrition -We will add Phenergan to assist with improvement in symptoms  Tachycardia- resolved -TSH 1.028 -2D echocardiogramwith no valvular vegetations or acute abnormalities -Likely related to ongoing hallucinations -Does not appear to have alcohol use or benzodiazepine use at home -Coreg increased to 12.5 mg twice daily  Severe hypertension- resolved -Continue on Norvasc, carvedilol, and Imdur -Given labetalol earlier with improvement -Hydralazine ordered as needed -Coreg increased yesterday and will decrease back to 6.25 twice daily  Type 2 diabetes-stable -SSI  History of prior CVA -Continue aspirin and Plavix -Lipid panel pending, uncertain why patient is not on statin  ESRD on HD M/W/F  with hyperkalemia-improved -Continue Rena-Vite -Appreciate nephrology evaluation with plans for  further hemodialysis in the next 1 to 2 days -We will need replacement catheter prior to repeat hemodialysis   DVT prophylaxis:Heparin Code Status:Full Family Communication:With family members on 8/30 Disposition Plan:Continue on IV antibiotics and switch to ceftazidime.  Plan for tunneled dialysis catheter placement in a.m. and resumption of hemodialysis thereafter.  EEG pending.   Consultants:  Nephrology  General surgery  Procedures:  None  Antimicrobials:  Anti-infectives (From admission, onward)   Start     Dose/Rate Route Frequency Ordered Stop   07/23/19 1200  ceFEPIme (MAXIPIME) 1 g in sodium chloride 0.9 % 100 mL IVPB  Status:  Discontinued     1 g 200 mL/hr over 30 Minutes Intravenous Daily 07/23/19 0859 07/24/19 1046   07/22/19 1600  ceFEPIme (MAXIPIME) 2 g in sodium chloride 0.9 % 100 mL IVPB  Status:  Discontinued     2 g 200 mL/hr over 30 Minutes Intravenous Once in dialysis 07/22/19 1044 07/23/19 0859   07/21/19 1600  ceFEPIme (MAXIPIME) 2 g in sodium chloride 0.9 % 100 mL IVPB  Status:  Discontinued     2 g 200 mL/hr over 30 Minutes Intravenous Once per day on Mon Wed Fri 07/20/19 1618 07/23/19 0859   07/20/19 1500  ceFEPIme (MAXIPIME) 1 g in sodium chloride 0.9 % 100 mL IVPB  Status:  Discontinued     1 g 200 mL/hr over 30 Minutes Intravenous Every 24 hours 07/20/19 1430 07/20/19 1432   07/20/19 1500  ceFEPIme (MAXIPIME) 2 g in sodium chloride 0.9 % 100 mL IVPB     2 g 200 mL/hr over 30 Minutes Intravenous  Once 07/20/19 1432 07/20/19 1659       Subjective: Patient seen and evaluated today with no new acute complaints or concerns. No acute concerns or events noted overnight.  She is alert and oriented this morning without any further hallucinations or agitations noted.  She is overall doing much better.  Objective: Vitals:   07/23/19 2107  07/23/19 2108 07/24/19 0500 07/24/19 0622  BP: (!) 89/71 (!) 89/71  119/89  Pulse: 92 93  81  Resp:  18  18  Temp: 97.6 F (36.4 C) 97.6 F (36.4 C)  (!) 97.5 F (36.4 C)  TempSrc: Oral Oral  Oral  SpO2: 100% 96%  99%  Weight:   60.8 kg   Height:        Intake/Output Summary (Last 24 hours) at 07/24/2019 1047 Last data filed at 07/23/2019 1700 Gross per 24 hour  Intake 189.9 ml  Output 500 ml  Net -310.1 ml   Filed Weights   07/22/19 1430 07/23/19 0500 07/24/19 0500  Weight: 62.7 kg 60.3 kg 60.8 kg    Examination:  General exam: Appears calm and comfortable  Respiratory system: Clear to auscultation. Respiratory effort normal. Cardiovascular system: S1 & S2 heard, RRR. No JVD, murmurs, rubs, gallops or clicks. No pedal edema. Gastrointestinal system: Abdomen is nondistended, soft and nontender. No organomegaly or masses felt. Normal bowel sounds heard. Central nervous system: Alert and oriented. No focal neurological deficits. Extremities: Symmetric 5 x 5 power. Skin: No rashes, lesions or ulcers Psychiatry: Judgement and insight appear normal. Mood & affect appropriate.     Data Reviewed: I have personally reviewed following labs and imaging studies  CBC: Recent Labs  Lab 07/19/19 1454 07/20/19 1445 07/21/19 0819 07/22/19 0617 07/23/19 0621 07/24/19 0522  WBC 22.8* 19.1* 17.4* 14.3* 15.3* 11.5*  NEUTROABS 20.7* 15.4*  --   --   --   --  HGB 11.5* 12.2 13.5 12.7 13.5 13.9  HCT 37.5 41.4 44.2 42.6 46.6* 47.0*  MCV 90.1 92.2 88.9 92.2 91.0 92.2  PLT 213 203 230 233 277 578   Basic Metabolic Panel: Recent Labs  Lab 07/19/19 1336 07/20/19 1445 07/21/19 0819 07/22/19 0617 07/23/19 0621 07/24/19 0522  NA 136 137 138 140 137 140  K 4.3 4.9 5.6* 5.1 4.8 4.5  CL 100 98 101 100 95* 100  CO2 25 28 20* 23 20* 26  GLUCOSE 145* 152* 144* 104* 171* 129*  BUN 28* 42* 49* 58* 27* 47*  CREATININE 4.44* 6.39* 7.00* 8.33* 4.97* 7.08*  CALCIUM 8.1* 8.5* 8.7* 8.9 9.0  8.8*  MG 1.6*  --   --   --   --   --    GFR: Estimated Creatinine Clearance: 7.3 mL/min (A) (by C-G formula based on SCr of 7.08 mg/dL (H)). Liver Function Tests: Recent Labs  Lab 07/19/19 1336 07/20/19 1445 07/21/19 0819  AST _0 ALT _1 ALKPHOS 104 116 116  BILITOT 0.6 0.5 0.6  PROT 7.4 8.3* 8.0  ALBUMIN 2.9* 3.1* 3.1*   No results for input(s): LIPASE, AMYLASE in the last 168 hours. No results for input(s): AMMONIA in the last 168 hours. Coagulation Profile: Recent Labs  Lab 07/19/19 1336 07/20/19 1445  INR 1.1 1.0   Cardiac Enzymes: No results for input(s): CKTOTAL, CKMB, CKMBINDEX, TROPONINI in the last 168 hours. BNP (last 3 results) No results for input(s): PROBNP in the last 8760 hours. HbA1C: No results for input(s): HGBA1C in the last 72 hours. CBG: Recent Labs  Lab 07/23/19 0730 07/23/19 1124 07/23/19 1615 07/23/19 2251 07/24/19 0804  GLUCAP 168* 193* 274* 104* 154*   Lipid Profile: No results for input(s): CHOL, HDL, LDLCALC, TRIG, CHOLHDL, LDLDIRECT in the last 72 hours. Thyroid Function Tests: No results for input(s): TSH, T4TOTAL, FREET4, T3FREE, THYROIDAB in the last 72 hours. Anemia Panel: No results for input(s): VITAMINB12, FOLATE, FERRITIN, TIBC, IRON, RETICCTPCT in the last 72 hours. Sepsis Labs: Recent Labs  Lab 07/19/19 1336  LATICACIDVEN 1.2    Recent Results (from the past 240 hour(s))  Urine culture     Status: Abnormal   Collection Time: 07/19/19 12:19 PM   Specimen: Urine, Catheterized  Result Value Ref Range Status   Specimen Description   Final    URINE, CATHETERIZED Performed at [facility], [address], [place], [place] [address]    Special Requests   Final    Normal Performed at [facility], [address] [place], [place] [address]    Culture (A)  Final    <10,000 COLONIES/mL INSIGNIFICANT GROWTH Performed at [facility] [address], [place], [place] [address]    Report Status  07/20/2019 FINAL  Final  Culture, blood (Routine x 2)     Status: Abnormal   Collection Time: 07/19/19  1:36 PM   Specimen: BLOOD RIGHT ARM  Result Value Ref Range Status   Specimen Description   Final    BLOOD RIGHT ARM Performed at [facility], [address], [place], [place] [address]    Special Requests   Final    BOTTLES DRAWN AEROBIC ONLY Blood Culture adequate volume Performed at [facility], [address], [place],  [address]    Culture  Setup Time   Final    GRAM NEGATIVE RODS AEROBIC BOTTLE ONLY Gram Stain Report Called to,Read Back By and Verified With: HARTLEY,A_2  @ 1100  BY MATTHEWS, B 8.27.2020 [facility] Organism ID to follow CRITICAL RESULT CALLED TO, READ BACK BY AND VERIFIED WITH: Marissa Nestle RN 07/20/19 1905 Performed at [facility], [place] [address] [place], [place] [address]    Culture PSEUDOMONAS AERUGINOSA (A)  Final   Report Status 07/22/2019 FINAL  Final   Organism ID, Bacteria PSEUDOMONAS AERUGINOSA  Final      Susceptibility   Pseudomonas aeruginosa - MIC*    CEFTAZIDIME 2 SENSITIVE Sensitive     CIPROFLOXACIN <=0.25 SENSITIVE Sensitive     GENTAMICIN <=1 SENSITIVE Sensitive     IMIPENEM 2 SENSITIVE Sensitive     PIP/TAZO <=4 SENSITIVE Sensitive     CEFEPIME <=1 SENSITIVE Sensitive     * PSEUDOMONAS AERUGINOSA  Culture, blood (Routine x 2)     Status: Abnormal   Collection Time: 07/19/19  1:36 PM   Specimen: BLOOD RIGHT HAND  Result Value Ref Range Status   Specimen Description   Final    BLOOD RIGHT HAND Performed at [facility], [address] [place], [place] [address]    Special Requests   Final    AEROBIC BOTTLE ONLY Blood Culture adequate volume Performed at [facility], [address] [place], [place] [address]    Culture  Setup Time   Final    AEROBIC BOTTLE ONLY GRAM NEGATIVE RODS CRITICAL VALUE NOTED.  VALUE IS CONSISTENT WITH PREVIOUSLY REPORTED AND CALLED VALUE.    Culture (A)  Final    PSEUDOMONAS  AERUGINOSA SUSCEPTIBILITIES PERFORMED ON PREVIOUS CULTURE WITHIN THE LAST 5 DAYS. Performed at [facility], [place] [address] [place], [place] [address]    Report Status 07/22/2019 FINAL  Final  Blood Culture ID Panel (Reflexed)     Status: Abnormal   Collection Time: 07/19/19  1:36 PM  Result Value Ref Range Status   Enterococcus species NOT DETECTED NOT DETECTED Final   Listeria monocytogenes NOT DETECTED NOT DETECTED Final   Staphylococcus species NOT DETECTED NOT DETECTED Final   Staphylococcus aureus (BCID) NOT DETECTED NOT DETECTED Final   Streptococcus species NOT DETECTED NOT DETECTED Final   Streptococcus agalactiae NOT DETECTED NOT DETECTED Final   Streptococcus pneumoniae NOT DETECTED NOT DETECTED Final   Streptococcus pyogenes NOT DETECTED NOT DETECTED Final   Acinetobacter baumannii NOT DETECTED NOT DETECTED Final   Enterobacteriaceae species NOT DETECTED NOT DETECTED Final   Enterobacter cloacae complex NOT DETECTED NOT DETECTED Final   Escherichia coli NOT DETECTED NOT DETECTED Final   Klebsiella oxytoca NOT DETECTED NOT DETECTED Final   Klebsiella pneumoniae NOT DETECTED NOT DETECTED Final   Proteus species NOT DETECTED NOT DETECTED Final   Serratia marcescens NOT DETECTED NOT DETECTED Final   Carbapenem resistance NOT DETECTED NOT DETECTED Final   Haemophilus influenzae NOT DETECTED NOT DETECTED Final   Neisseria meningitidis NOT DETECTED NOT DETECTED Final   Pseudomonas aeruginosa DETECTED (A) NOT DETECTED Final    Comment: CRITICAL RESULT CALLED TO, READ BACK BY AND VERIFIED WITH: Marissa Nestle RN 07/20/19 1905 JDW    Candida albicans NOT DETECTED NOT DETECTED Final   Candida glabrata NOT DETECTED NOT DETECTED Final   Candida krusei NOT DETECTED NOT DETECTED Final   Candida parapsilosis NOT DETECTED NOT DETECTED Final   Candida tropicalis NOT DETECTED NOT DETECTED Final    Comment: Performed at [facility], [address] [place], [place] [address]    Culture, blood (Routine x 2)     Status: None (Preliminary result)  Collection Time: 07/20/19  2:45 PM   Specimen: BLOOD LEFT HAND  Result Value Ref Range Status   Specimen Description BLOOD LEFT HAND  Final   Special Requests   Final    BOTTLES DRAWN AEROBIC ONLY Blood Culture adequate volume   Culture   Final    NO GROWTH 4 DAYS Performed at [facility], [address] [place], [place] [address]    Report Status PENDING  Incomplete  Culture, blood (Routine x 2)     Status: None (Preliminary result)   Collection Time: 07/20/19  2:45 PM   Specimen: BLOOD RIGHT HAND  Result Value Ref Range Status   Specimen Description BLOOD RIGHT HAND  Final   Special Requests   Final    BOTTLES DRAWN AEROBIC ONLY Blood Culture adequate volume   Culture   Final    NO GROWTH 4 DAYS Performed at [facility], [address] [place], [place] [address]    Report Status PENDING  Incomplete  SARS Coronavirus 2 [facility] order, Performed in [place] hospital lab) Nasopharyngeal Nasopharyngeal Swab     Status: None   Collection Time: 07/20/19  5:11 PM   Specimen: Nasopharyngeal Swab  Result Value Ref Range Status   SARS Coronavirus 2 NEGATIVE NEGATIVE Final    Comment: (NOTE) If result is NEGATIVE SARS-CoV-2 target nucleic acids are NOT DETECTED. The SARS-CoV-2 RNA is generally detectable in upper and lower  respiratory specimens during the acute phase of infection. The lowest  concentration of SARS-CoV-2 viral copies this assay can detect is 250  copies / mL. A negative result does not preclude SARS-CoV-2 infection  and should not be used as the sole basis for treatment or other  patient management decisions.  A negative result may occur with  improper specimen collection / handling, submission of specimen other  than nasopharyngeal swab, presence of viral mutation(s) within the  areas targeted by this assay, and inadequate number of viral copies  (<250 copies / mL). A negative result must be  combined with clinical  observations, patient history, and epidemiological information. If result is POSITIVE SARS-CoV-2 target nucleic acids are DETECTED. The SARS-CoV-2 RNA is generally detectable in upper and lower  respiratory specimens dur ing the acute phase of infection.  Positive  results are indicative of active infection with SARS-CoV-2.  Clinical  correlation with patient history and other diagnostic information is  necessary to determine patient infection status.  Positive results do  not rule out bacterial infection or co-infection with other viruses. If result is PRESUMPTIVE POSTIVE SARS-CoV-2 nucleic acids MAY BE PRESENT.   A presumptive positive result was obtained on the submitted specimen  and confirmed on repeat testing.  While 2019 novel coronavirus  (SARS-CoV-2) nucleic acids may be present in the submitted sample  additional confirmatory testing may be necessary for epidemiological  and / or clinical management purposes  to differentiate between  SARS-CoV-2 and other Sarbecovirus currently known to infect humans.  If clinically indicated additional testing with an alternate test  methodology [phone]) is advised. The SARS-CoV-2 RNA is generally  detectable in upper and lower respiratory sp ecimens during the acute  phase of infection. The expected result is Negative. Fact Sheet for Patients:  StrictlyIdeas.no Fact Sheet for Healthcare Providers: BankingDealers.co.za This test is not yet approved or cleared by the [place] FDA and has been authorized for detection and/or diagnosis of SARS-CoV-2 by FDA under an Emergency Use Authorization (EUA).  This EUA will remain in effect (meaning this  test can be used) for the duration of the COVID-19 declaration under Section 564(b)(1) of the Act, 21 U.S.C. section 360bbb-3(b)(1), unless the authorization is terminated or revoked sooner. Performed at [facility],  [address], [place], [place] [address]   Aerobic Culture (superficial specimen)     Status: None (Preliminary result)   Collection Time: 07/23/19  9:31 AM   Specimen: Catheter Tip  Result Value Ref Range Status   Specimen Description   Final    CATH TIP Performed at [facility], [address], [place], [place] [address]    Special Requests   Final    Immunocompromised Performed at [facility], [address], [place], [place] [address]    Gram Stain   Final    NO WBC SEEN NO ORGANISMS SEEN Performed at [facility], [place] [address], [place], [place] [address]    Culture PENDING  Incomplete   Report Status PENDING  Incomplete         Radiology Studies: No results found.      Scheduled Meds:  amLODipine  10 mg Oral QPC lunch   aspirin EC  81 mg Oral QPC lunch   calcium carbonate  1 tablet Oral BID   carvedilol  12.5 mg Oral BID   Chlorhexidine Gluconate Cloth  6 each Topical Q0600   clopidogrel  75 mg Oral Daily   feeding supplement (NEPRO CARB STEADY)  237 mL Oral BID BM   heparin  5,000 Units Subcutaneous Q8H   insulin aspart  0-5 Units Subcutaneous QHS   insulin aspart  0-9 Units Subcutaneous TID WC   isosorbide mononitrate  30 mg Oral QPC lunch   multivitamin  1 tablet Oral QPC lunch   sodium chloride flush  3 mL Intravenous Q12H   Continuous Infusions:  sodium chloride     sodium chloride     sodium chloride     levETIRAcetam 500 mg (07/24/19 1007)     LOS: 4 days    Time spent: 30 minutes    Leng Montesdeoca Darleen Crocker, DO [facility] Pager [phone]  If 7PM-7AM, please contact night-coverage [email or web address] Password [email or web address] 07/24/2019, 10:47 AM

## 2019-07-24 NOTE — Procedures (Addendum)
Patient Name: [name]  MRN: [id number]  Epilepsy Attending: Lora Havens  Referring Physician/Provider: Dr Heath Lark Date: 07/24/2019 Duration: 23.17 mins  Patient history: 68yo F with ams. EEG to evaluate for seizures.   Level of alertness: awake  AEDs during EEG study: keppra  Technical aspects: This EEG study was done with scalp electrodes positioned according to the 10-20 International system of electrode placement. Electrical activity was acquired at a sampling rate of 500Hz  and reviewed with a high frequency filter of 70Hz  and a low frequency filter of 1Hz . EEG data were recorded continuously and digitally stored.   DESCRIPTION: EEG showed continuous generalized 3-5Hz  theta-delta slowing as well as intermittent rhythmic slowing. No clear posterior dominant rhythm was seen. Hyperventilation and photic stimulation were not performed.  IMPRESSION: This study is suggestive of mild to moderate encephalopathy, non specific to etiology. No seizures or definite epileptiform discharges were seen throughout the recording.  Kimori Tartaglia [name]

## 2019-07-24 NOTE — Progress Notes (Addendum)
Patient ID: [name], female   DOB: Aug 16, 1951, 68 y.o.   MRN: [id number] S: More awake and alert this am.  No complaints O:BP 119/89 (BP Location: Left Arm)   Pulse 81   Temp (!) 97.5 F (36.4 C) (Oral)   Resp 18   Ht 5\' 7"  (1.702 m)   Wt 60.8 kg   SpO2 99%   BMI 20.99 kg/m   Intake/Output Summary (Last 24 hours) at 07/24/2019 [id number] Last data filed at 07/23/2019 1700 Gross per 24 hour  Intake 189.9 ml  Output 500 ml  Net -310.1 ml   Intake/Output: I/O last 3 completed shifts: In: 189.9 [IV Piggyback:189.9] Out: 1867 [Urine:500; [id number]  Intake/Output this shift:  No intake/output data recorded. Weight change: -1.9 kg Gen: NAD CVS: no rub Resp: cta Abd: +BS, soft, NT/ND Ext:no edema  Recent Labs  Lab 07/19/19 1336 07/20/19 1445 07/21/19 0819 07/22/19 0617 07/23/19 0621 07/24/19 0522  NA 136 137 138 140 137 140  K 4.3 4.9 5.6* 5.1 4.8 4.5  CL 100 98 101 100 95* 100  CO2 25 28 20* 23 20* 26  GLUCOSE 145* 152* 144* 104* 171* 129*  BUN 28* 42* 49* 58* 27* 47*  CREATININE 4.44* 6.39* 7.00* 8.33* 4.97* 7.08*  ALBUMIN 2.9* 3.1* 3.1*  --   --   --   CALCIUM 8.1* 8.5* 8.7* 8.9 9.0 8.8*  AST 31 16 25   --   --   --   ALT 20 16 16   --   --   --    Liver Function Tests: Recent Labs  Lab 07/19/19 1336 07/20/19 1445 07/21/19 0819  AST 31 16 25   ALT 20 16 16   ALKPHOS 104 116 116  BILITOT 0.6 0.5 0.6  PROT 7.4 8.3* 8.0  ALBUMIN 2.9* 3.1* 3.1*   No results for input(s): LIPASE, AMYLASE in the last 168 hours. No results for input(s): AMMONIA in the last 168 hours. CBC: Recent Labs  Lab 07/19/19 1454 07/20/19 1445 07/21/19 0819 07/22/19 0617 07/23/19 0621 07/24/19 0522  WBC 22.8* 19.1* 17.4* 14.3* 15.3* 11.5*  NEUTROABS 20.7* 15.4*  --   --   --   --   HGB 11.5* 12.2 13.5 12.7 13.5 13.9  HCT 37.5 41.4 44.2 42.6 46.6* 47.0*  MCV 90.1 92.2 88.9 92.2 91.0 92.2  PLT 213 203 230 233 277 250   Cardiac Enzymes: No results for input(s): CKTOTAL, CKMB,  CKMBINDEX, TROPONINI in the last 168 hours. CBG: Recent Labs  Lab 07/23/19 0730 07/23/19 1124 07/23/19 1615 07/23/19 2251 07/24/19 0804  GLUCAP 168* 193* 274* 104* 154*    Iron Studies: No results for input(s): IRON, TIBC, TRANSFERRIN, FERRITIN in the last 72 hours. Studies/Results: No results found. Marland Kitchen amLODipine  10 mg Oral QPC lunch  . aspirin EC  81 mg Oral QPC lunch  . calcium carbonate  1 tablet Oral BID  . carvedilol  12.5 mg Oral BID  . Chlorhexidine Gluconate Cloth  6 each Topical Q0600  . clopidogrel  75 mg Oral Daily  . feeding supplement (NEPRO CARB STEADY)  237 mL Oral BID BM  . heparin  5,000 Units Subcutaneous Q8H  . insulin aspart  0-5 Units Subcutaneous QHS  . insulin aspart  0-9 Units Subcutaneous TID WC  . isosorbide mononitrate  30 mg Oral QPC lunch  . multivitamin  1 tablet Oral QPC lunch  . sodium chloride flush  3 mL Intravenous Q12H    BMET    Component Value  Date/Time   NA 140 07/24/2019 0522   K 4.5 07/24/2019 0522   CL 100 07/24/2019 0522   CO2 26 07/24/2019 0522   GLUCOSE 129 (H) 07/24/2019 0522   BUN 47 (H) 07/24/2019 0522   CREATININE 7.08 (H) 07/24/2019 0522   CALCIUM 8.8 (L) 07/24/2019 0522   GFRNONAA 5 (L) 07/24/2019 0522   GFRAA 6 (L) 07/24/2019 0522   CBC    Component Value Date/Time   WBC 11.5 (H) 07/24/2019 0522   RBC 5.10 07/24/2019 0522   HGB 13.9 07/24/2019 0522   HCT 47.0 (H) 07/24/2019 0522   PLT 250 07/24/2019 0522   MCV 92.2 07/24/2019 0522   MCH 27.3 07/24/2019 0522   MCHC 29.6 (L) 07/24/2019 0522   RDW 16.1 (H) 07/24/2019 0522   LYMPHSABS 2.2 07/20/2019 1445   MONOABS 1.3 (H) 07/20/2019 1445   EOSABS 0.1 07/20/2019 1445   BASOSABS 0.0 07/20/2019 1445     Assessment/Plan:  1. Pseudomonas bacteremia- On cefepime and TDC removed 07/23/19.  Tip culture negative.  Still with back pain and concern for discitis/osteo of L3/L4.  At time of discharge will need to change abx to [facility] 2 grams with HD as an  outpatient 2. Vascular access- TDC removed 07/23/19.  Will consult Surgery to place [facility] tomorrow and plan for HD afterwards for line holiday.  3. ESRD- normally MWF, off schedule due to hospitalization and last HD 07/22/19.  Will plan for HD tomorrow after [facility] placement 4. Possible discitis of L3/L4- on abx.  For repeat MRI in 3-4 weeks to document stability.  5. Acute metabolic encephalopathy- improved with IV abx  Donetta Potts, MD [facility] [phone]

## 2019-07-24 NOTE — Consult Note (Signed)
[facility] Consult  Reason for Consult: Dialysis catheter access Referring Physician:  Dr. Marval Regal   Chief Complaint    Abnormal Lab      HPI: [name] is a 68 y.o. female with a history of HTN, HLD, CVA, ESRD on HD MWF via prior right IJ tunneled catheter who presented to the hospital a few days ago with bacteremia with pseudomonas possibly from osteomyelitis of the lumbar spine.  She had her prior dialysis catheter placed by Dr. Donnetta Hutching 09/2018 and a fistula performed on the left arm. The fistulas have been revised and continue to clot, and the patient was placed on Plavix to aid with this issue. She has not had dialysis via a fistula due to these issues, and had a venogram with vascular 10/2018 with no overt central stenosis but a stagnant system on the left, and a brisk open system on the right.  She is being evaluated for possible future right AVF and also reported to be considering peritoneal dialysis per her son, [name].   The patient has been confused during her admission, and [name] is her POA.  She is oriented today.    Past Medical History:  Diagnosis Date  . Arthritis    hands  . Constipation   . Diabetes mellitus without complication (HCC)    Type II  . ESRD (end stage renal disease) ([place])     M/W/F- Hemodialysis  . GERD (gastroesophageal reflux disease)   . Headache   . Hyperlipidemia   . Hypertension   . Irregular heart rate   . Stroke [facility])    TIA - approx 2010- no residual    Past Surgical History:  Procedure Laterality Date  . ABDOMINAL HYSTERECTOMY    . AORTIC ARCH ANGIOGRAPHY N/A 03/28/2019   Procedure: AORTIC ARCH ANGIOGRAPHY;  Surgeon: Waynetta Sandy, MD;  Location: [place] CV LAB;  Service: Cardiovascular;  Laterality: N/A;  . AV FISTULA PLACEMENT Left 06/13/2018   Procedure: ARTERIOVENOUS (AV) FISTULA CREATION;  Surgeon: Rosetta Posner, MD;  Location: [place];  Service: Vascular;  Laterality: Left;  . AV FISTULA  PLACEMENT Left 08/18/2018   Procedure: INSERTION OF 4-7MM X 45CM ARTERIOVENOUS (AV) GORE-TEX GRAFT LEFT  FOREARM;  Surgeon: Rosetta Posner, MD;  Location: [place];  Service: Vascular;  Laterality: Left;  . AV FISTULA PLACEMENT Right 04/06/2019   Procedure: INSERTION OF ARTERIOVENOUS (AV) GORE-TEX GRAFT RIGHT UPPER ARM;  Surgeon: Waynetta Sandy, MD;  Location: [place];  Service: Vascular;  Laterality: Right;  . AV FISTULA PLACEMENT Right 04/13/2019   Procedure: INSERTION OF ARTERIOVENOUS (AV) BOVINE  ARTEGRAFT GRAFT RIGHT UPPER EXTREMITY;  Surgeon: Serafina Mitchell, MD;  Location: [place];  Service: Vascular;  Laterality: Right;  . [place] REMOVAL Right 05/16/2019   Procedure: REMOVAL OF ARTERIOVENOUS GORETEX GRAFT ([place]) RIGHT ARM;  Surgeon: Angelia Mould, MD;  Location: [place];  Service: Vascular;  Laterality: Right;  . BASCILIC VEIN TRANSPOSITION Right 03/07/2019   Procedure: First Stage Bascilic Vein Transposition Right Arm;  Surgeon: Serafina Mitchell, MD;  Location: [place];  Service: Vascular;  Laterality: Right;  . CATARACT EXTRACTION Right 2005  . INSERTION OF DIALYSIS CATHETER N/A 08/18/2018   Procedure: INSERTION OF DIALYSIS CATHETER;  Surgeon: Rosetta Posner, MD;  Location: [place];  Service: Vascular;  Laterality: N/A;  . IR FLUORO GUIDE CV LINE RIGHT  12/06/2018  . IR PTA ADDL CENTRAL DIALYSIS SEG THRU DIALY CIRCUIT RIGHT Right 12/06/2018  . THROMBECTOMY AND  REVISION OF ARTERIOVENTOUS (AV) GORETEX  GRAFT Left 09/29/2018   Procedure: INSERTION OF ARTERIOVENTOUS (AV) GORETEX  GRAFT ARM;  Surgeon: Waynetta Sandy, MD;  Location: [place];  Service: Vascular;  Laterality: Left;  . UPPER EXTREMITY ANGIOGRAPHY Right 03/28/2019   Procedure: UPPER EXTREMITY ANGIOGRAPHY;  Surgeon: Waynetta Sandy, MD;  Location: [place] CV LAB;  Service: Cardiovascular;  Laterality: Right;  . VENOGRAM  10/27/2018   Procedure: Venogram;  Surgeon: Marty Heck, MD;  Location: [place] CV  LAB;  Service: Cardiovascular;;  bilateral arm    Family History  Problem Relation Age of Onset  . Heart murmur Mother   . Heart failure Mother   . Diabetes Mother   . Cirrhosis Father   . Alcoholism Father     Social History   Tobacco Use  . Smoking status: Never Smoker  . Smokeless tobacco: Never Used  Substance Use Topics  . Alcohol use: Never    Frequency: Never  . Drug use: Never    Medications:  I have reviewed the patient's current medications. Prior to Admission:  Medications Prior to Admission  Medication Sig Dispense Refill Last Dose  . amLODipine (NORVASC) 10 MG tablet Take 10 mg by mouth daily after lunch.    07/19/2019 at Unknown time  . aspirin EC 81 MG tablet Take 81 mg by mouth daily after lunch.    07/19/2019 at Unknown time  . calcium carbonate (TUMS - DOSED IN MG ELEMENTAL CALCIUM) 500 MG chewable tablet Chew 1 tablet by mouth 2 (two) times daily. With lunch & supper   07/19/2019 at Unknown time  . carvedilol (COREG) 6.25 MG tablet Take 6.25 mg by mouth 2 (two) times daily.   07/19/2019 at 2030  . clopidogrel (PLAVIX) 75 MG tablet Take 1 tablet (75 mg total) by mouth daily. 30 tablet 11 07/19/2019 at 2030  . fluticasone (FLONASE) 50 MCG/ACT nasal spray Place 1 spray into both nostrils daily as needed for allergies or rhinitis.    Past Week at Unknown time  . insulin NPH-regular Human (NOVOLIN 70/30) (70-30) 100 UNIT/ML injection Inject 10-20 Units into the skin See admin instructions. Use twice daily per sliding scale: Blood sugar less than 150=12 units & Blood sugar 200 or more=20 units   07/19/2019 at Unknown time  . isosorbide mononitrate (IMDUR) 30 MG 24 hr tablet Take 30 mg by mouth daily after lunch.    07/19/2019 at Unknown time  . multivitamin (RENA-VIT) TABS tablet Take 1 tablet by mouth daily after lunch.    07/19/2019 at Unknown time  . predniSONE (DELTASONE) 10 MG tablet Take 10-20 mg by mouth See admin instructions. Starting on 07/13/2019 take 20mg  for 5  days, then take 10mg  until all taken as directed.   07/18/2019  . sulfamethoxazole-trimethoprim (BACTRIM) 400-80 MG tablet Take 1 tablet by mouth daily. On dialysis days, take after your dialysis treatment. (Patient not taking: Reported on 07/20/2019) 14 tablet 0 Not Taking at Unknown time   Scheduled: . amLODipine  10 mg Oral QPC lunch  . aspirin EC  81 mg Oral QPC lunch  . calcium carbonate  1 tablet Oral BID  . carvedilol  6.25 mg Oral BID  . Chlorhexidine Gluconate Cloth  6 each Topical Q0600  . [START ON 07/25/2019] clopidogrel  75 mg Oral Daily  . feeding supplement (NEPRO CARB STEADY)  237 mL Oral BID BM  . heparin  5,000 Units Subcutaneous Q8H  . insulin aspart  0-5 Units Subcutaneous QHS  .  insulin aspart  0-9 Units Subcutaneous TID WC  . isosorbide mononitrate  30 mg Oral QPC lunch  . multivitamin  1 tablet Oral QPC lunch  . sodium chloride flush  3 mL Intravenous Q12H   Continuous: . sodium chloride    . sodium chloride    . sodium chloride    . cefTAZidime (FORTAZ)  IV    . levETIRAcetam 500 mg (07/24/19 1007)   [id number] chloride, sodium chloride, sodium chloride, acetaminophen **OR** acetaminophen, alum & mag hydroxide-simeth, fluticasone, haloperidol lactate, heparin, hydrALAZINE, HYDROmorphone (DILAUDID) injection, ondansetron (ZOFRAN) IV, promethazine, sodium chloride flush  No Known Allergies   ROS:  A comprehensive review of systems was negative except for: Musculoskeletal: positive for back pain  Blood pressure 122/84, pulse 79, temperature 98.3 F (36.8 C), temperature source Oral, resp. rate 18, height 5\' 7"  (1.702 m), weight 60.8 kg, SpO2 100 %. Physical Exam Vitals signs reviewed.  Constitutional:      Appearance: Normal appearance.  HENT:     Head: Normocephalic.     Nose: Nose normal.     Mouth/Throat:     Mouth: Mucous membranes are moist.  Eyes:     Extraocular Movements: Extraocular movements intact.  Neck:     Musculoskeletal: Normal range of  motion.  Cardiovascular:     Rate and Rhythm: Normal rate and regular rhythm.  Pulmonary:     Effort: Pulmonary effort is normal.     Breath sounds: Normal breath sounds.  Chest:     Comments: Bandage from prior RIJ tunneled line in place Abdominal:     General: There is no distension.     Palpations: Abdomen is soft.     Tenderness: There is no abdominal tenderness.  Musculoskeletal: Normal range of motion.        General: No swelling.  Skin:    General: Skin is warm and dry.  Neurological:     General: No focal deficit present.     Mental Status: She is alert and oriented to person, place, and time.  Psychiatric:        Mood and Affect: Mood normal.        Behavior: Behavior normal.        Thought Content: Thought content normal.     Results: Results for orders placed or performed during the hospital encounter of 07/20/19 (from the past 48 hour(s))  Glucose, capillary     Status: Abnormal   Collection Time: 07/22/19 11:18 AM  Result Value Ref Range   Glucose-Capillary 127 (H) 70 - 99 mg/dL  Glucose, capillary     Status: Abnormal   Collection Time: 07/22/19  9:26 PM  Result Value Ref Range   Glucose-Capillary 119 (H) 70 - 99 mg/dL   Comment 1 Notify RN    Comment 2 Document in Chart   Basic metabolic panel     Status: Abnormal   Collection Time: 07/23/19  6:21 AM  Result Value Ref Range   Sodium 137 135 - 145 mmol/L   Potassium 4.8 3.5 - 5.1 mmol/L   Chloride 95 (L) 98 - 111 mmol/L   CO2 20 (L) 22 - 32 mmol/L   Glucose, Bld 171 (H) 70 - 99 mg/dL   BUN 27 (H) 8 - 23 mg/dL   Creatinine, Ser 4.97 (H) 0.44 - 1.00 mg/dL   Calcium 9.0 8.9 - 10.3 mg/dL   GFR calc non Af Amer 8 (L) >60 mL/min   GFR calc Af Amer 10 (L) >60 mL/min  Anion gap 22 (H) 5 - 15    Comment: Performed at [facility], [address] [place], [place] [address]  CBC     Status: Abnormal   Collection Time: 07/23/19  6:21 AM  Result Value Ref Range   WBC 15.3 (H) 4.0 - 10.5 K/uL   RBC 5.12 (H)  3.87 - 5.11 MIL/uL   Hemoglobin 13.5 12.0 - 15.0 g/dL   HCT 46.6 (H) 36.0 - 46.0 %   MCV 91.0 80.0 - 100.0 fL   MCH 26.4 26.0 - 34.0 pg   MCHC 29.0 (L) 30.0 - 36.0 g/dL   RDW 16.3 (H) 11.5 - 15.5 %   Platelets 277 150 - 400 K/uL   nRBC 0.0 0.0 - 0.2 %    Comment: Performed at [facility], [address] [place], [place] [address]  Glucose, capillary     Status: Abnormal   Collection Time: 07/23/19  7:30 AM  Result Value Ref Range   Glucose-Capillary 168 (H) 70 - 99 mg/dL  Aerobic Culture (superficial specimen)     Status: None (Preliminary result)   Collection Time: 07/23/19  9:31 AM   Specimen: Catheter Tip  Result Value Ref Range   Specimen Description      CATH TIP Performed at [facility], [address], [place], [place] [address]    Special Requests      Immunocompromised Performed at [facility], [address] [place], [place] [address]    Gram Stain      NO WBC SEEN NO ORGANISMS SEEN Performed at [facility], [place] [address], [place], [place] [address]    Culture PENDING    Report Status PENDING   Glucose, capillary     Status: Abnormal   Collection Time: 07/23/19 11:24 AM  Result Value Ref Range   Glucose-Capillary 193 (H) 70 - 99 mg/dL  Glucose, capillary     Status: Abnormal   Collection Time: 07/23/19  4:15 PM  Result Value Ref Range   Glucose-Capillary 274 (H) 70 - 99 mg/dL  Glucose, capillary     Status: Abnormal   Collection Time: 07/23/19 10:51 PM  Result Value Ref Range   Glucose-Capillary 104 (H) 70 - 99 mg/dL   Comment 1 Notify RN    Comment 2 Document in Chart   Basic metabolic panel     Status: Abnormal   Collection Time: 07/24/19  5:22 AM  Result Value Ref Range   Sodium 140 135 - 145 mmol/L   Potassium 4.5 3.5 - 5.1 mmol/L   Chloride 100 98 - 111 mmol/L   CO2 26 22 - 32 mmol/L   Glucose, Bld 129 (H) 70 - 99 mg/dL   BUN 47 (H) 8 - 23 mg/dL   Creatinine, Ser 7.08 (H) 0.44 - 1.00 mg/dL   Calcium 8.8 (L) 8.9 - 10.3 mg/dL   GFR  calc non Af Amer 5 (L) >60 mL/min   GFR calc Af Amer 6 (L) >60 mL/min   Anion gap 14 5 - 15    Comment: Performed at [facility], [address] [place], [place] [address]  CBC     Status: Abnormal   Collection Time: 07/24/19  5:22 AM  Result Value Ref Range   WBC 11.5 (H) 4.0 - 10.5 K/uL   RBC 5.10 3.87 - 5.11 MIL/uL   Hemoglobin 13.9 12.0 - 15.0 g/dL   HCT 47.0 (H) 36.0 - 46.0 %   MCV 92.2 80.0 - 100.0 fL  MCH 27.3 26.0 - 34.0 pg   MCHC 29.6 (L) 30.0 - 36.0 g/dL   RDW 16.1 (H) 11.5 - 15.5 %   Platelets 250 150 - 400 K/uL   nRBC 0.0 0.0 - 0.2 %    Comment: Performed at [facility], [address], [place], [place] [address]  Glucose, capillary     Status: Abnormal   Collection Time: 07/24/19  8:04 AM  Result Value Ref Range   Glucose-Capillary 154 (H) 70 - 99 mg/dL    Personally reviewed CXR 8/27 with possible atelectasis/ infiltrate, line in place   EKG with sinus rhythm 8/27   Assessment & Plan:  Isabelita Bottari is a 68 y.o. female with end stage renal disease in the need of dialysis access for longterm dialysis. She has had prior left AVF and AVG that have clotted, and is on Plavix due to this issue. She needs a new line and I have been asked to place a tunneled catheter. I discussed the risk of the catheter with her and her son, and the risk of additional bleeding, hematoma due to the Plavix.  We discussed the concern for possible narrowing/ stenosis on the left and will plan for a right sided catheter. We discussed the risk of infection, and the risk of injury to the other vascular structures, especially given her prior procedures on the neck on the right.  Discussed option of femoral line and holding Plavix, but risk of infection, still risk of bleeding, and delay in definitive care with this plan. The son would like to proceed with the tunneled catheter.   -Tunneled dialysis catheter - will do right sided -NPO midnight -Receiving bacteremia treatment now  -Discussed with  Dr. Marval Regal and Dr. Manuella Ghazi.   All questions were answered to the satisfaction of the patient and family.   Virl Cagey 07/24/2019, 11:00 AM

## 2019-07-24 NOTE — Progress Notes (Signed)
EEG Completed; Results Pending  

## 2019-07-25 ENCOUNTER — Inpatient Hospital Stay (HOSPITAL_COMMUNITY): Payer: Medicare PPO | Admitting: Anesthesiology

## 2019-07-25 ENCOUNTER — Other Ambulatory Visit: Payer: Self-pay

## 2019-07-25 ENCOUNTER — Inpatient Hospital Stay (HOSPITAL_COMMUNITY): Payer: Medicare PPO

## 2019-07-25 ENCOUNTER — Encounter (HOSPITAL_COMMUNITY)
Admission: EM | Disposition: A | Discharge status: Home / Self Care | Payer: Self-pay | Source: Home / Self Care | Attending: Internal Medicine

## 2019-07-25 ENCOUNTER — Encounter (HOSPITAL_COMMUNITY): Payer: Self-pay

## 2019-07-25 HISTORY — PX: INSERTION OF DIALYSIS CATHETER: SHX1324

## 2019-07-25 LAB — CBC
HCT: 39.4 % (ref 36.0–46.0)
Hemoglobin: 11.8 g/dL — ABNORMAL LOW (ref 12.0–15.0)
MCH: 27 pg (ref 26.0–34.0)
MCHC: 29.9 g/dL — ABNORMAL LOW (ref 30.0–36.0)
MCV: 90.2 fL (ref 80.0–100.0)
Platelets: 250 10*3/uL (ref 150–400)
RBC: 4.37 MIL/uL (ref 3.87–5.11)
RDW: 15.6 % — ABNORMAL HIGH (ref 11.5–15.5)
WBC: 9 10*3/uL (ref 4.0–10.5)
nRBC: 0 % (ref 0.0–0.2)

## 2019-07-25 LAB — CULTURE, BLOOD (ROUTINE X 2)
Culture: NO GROWTH
Culture: NO GROWTH
Special Requests: ADEQUATE
Special Requests: ADEQUATE

## 2019-07-25 LAB — BASIC METABOLIC PANEL
Anion gap: 16 — ABNORMAL HIGH (ref 5–15)
BUN: 61 mg/dL — ABNORMAL HIGH (ref 8–23)
CO2: 23 mmol/L (ref 22–32)
Calcium: 8.4 mg/dL — ABNORMAL LOW (ref 8.9–10.3)
Chloride: 95 mmol/L — ABNORMAL LOW (ref 98–111)
Creatinine, Ser: 8.34 mg/dL — ABNORMAL HIGH (ref 0.44–1.00)
GFR calc Af Amer: 5 mL/min — ABNORMAL LOW (ref >60)
GFR calc non Af Amer: 4 mL/min — ABNORMAL LOW (ref >60)
Glucose, Bld: 128 mg/dL — ABNORMAL HIGH (ref 70–99)
Potassium: 4.7 mmol/L (ref 3.5–5.1)
Sodium: 134 mmol/L — ABNORMAL LOW (ref 135–145)

## 2019-07-25 LAB — SURGICAL PCR SCREEN
MRSA, PCR: NEGATIVE
Staphylococcus aureus: NEGATIVE

## 2019-07-25 LAB — GLUCOSE, CAPILLARY
Glucose-Capillary: 169 mg/dL — ABNORMAL HIGH (ref 70–99)
Glucose-Capillary: 128 mg/dL — ABNORMAL HIGH (ref 70–99)
Glucose-Capillary: 117 mg/dL — ABNORMAL HIGH (ref 70–99)
Glucose-Capillary: 149 mg/dL — ABNORMAL HIGH (ref 70–99)

## 2019-07-25 SURGERY — INSERTION OF DIALYSIS CATHETER
Anesthesia: Monitor Anesthesia Care | Laterality: Right

## 2019-07-25 MED ORDER — MUPIROCIN 2 % EX OINT
1.0000 "application " | TOPICAL_OINTMENT | Freq: Two times a day (BID) | CUTANEOUS | Status: DC
  Administered 2019-07-25 – 2019-07-26 (×3): 1 via NASAL
  Filled 2019-07-25: qty 22

## 2019-07-25 MED ORDER — PROPOFOL 10 MG/ML IV BOLUS
INTRAVENOUS | Status: AC
  Filled 2019-07-25: qty 40

## 2019-07-25 MED ORDER — HEPARIN 1000 UNIT/ML FOR PERITONEAL DIALYSIS
INTRAMUSCULAR | Status: DC | PRN
  Administered 2019-07-25: 4000 [IU] via INTRAVENOUS

## 2019-07-25 MED ORDER — SODIUM CHLORIDE 0.9 % IV SOLN
2.0000 g | Freq: Once | INTRAVENOUS | Status: AC
  Administered 2019-07-25: 20:00:00 2 g via INTRAVENOUS
  Filled 2019-07-25: qty 2

## 2019-07-25 MED ORDER — PHENYLEPHRINE HCL (PRESSORS) 10 MG/ML IV SOLN
INTRAVENOUS | Status: DC | PRN
  Administered 2019-07-25: 80 ug via INTRAVENOUS

## 2019-07-25 MED ORDER — LACTATED RINGERS IV SOLN
INTRAVENOUS | Status: DC
  Administered 2019-07-25: 12:00:00 via INTRAVENOUS

## 2019-07-25 MED ORDER — SODIUM CHLORIDE 0.9 % IV SOLN
2.0000 g | INTRAVENOUS | Status: DC
  Filled 2019-07-25: qty 2

## 2019-07-25 MED ORDER — LIDOCAINE 2% (20 MG/ML) 5 ML SYRINGE
INTRAMUSCULAR | Status: AC
  Filled 2019-07-25: qty 5

## 2019-07-25 MED ORDER — SODIUM CHLORIDE (PF) 0.9 % IJ SOLN
INTRAMUSCULAR | Status: DC | PRN
  Administered 2019-07-25: 500 mL

## 2019-07-25 MED ORDER — LIDOCAINE HCL (PF) 1 % IJ SOLN
INTRAMUSCULAR | Status: DC | PRN
  Administered 2019-07-25: 13.5 mL
  Administered 2019-07-25: 2.5 mL

## 2019-07-25 MED ORDER — PROPOFOL 500 MG/50ML IV EMUL
INTRAVENOUS | Status: DC | PRN
  Administered 2019-07-25: 75 ug/kg/min via INTRAVENOUS

## 2019-07-25 MED ORDER — HYDROCODONE-ACETAMINOPHEN 7.5-325 MG PO TABS: 1.0000 | ORAL_TABLET | Freq: Once | ORAL | Status: DC | PRN

## 2019-07-25 MED ORDER — NEPRO/CARBSTEADY PO LIQD: 237.0000 mL | Freq: Two times a day (BID) | ORAL | 0 refills | Status: AC

## 2019-07-25 MED ORDER — MIDAZOLAM HCL 2 MG/2ML IJ SOLN: 0.5000 mg | Freq: Once | INTRAMUSCULAR | Status: DC | PRN

## 2019-07-25 MED ORDER — CEFTAZIDIME IV (FOR PTA / DISCHARGE USE ONLY): 2.0000 g | INTRAVENOUS | 0 refills | Status: AC

## 2019-07-25 MED ORDER — LIDOCAINE HCL (PF) 1 % IJ SOLN
INTRAMUSCULAR | Status: AC
  Filled 2019-07-25: qty 30

## 2019-07-25 MED ORDER — HEPARIN SODIUM (PORCINE) 1000 UNIT/ML DIALYSIS
20.0000 [IU]/kg | INTRAMUSCULAR | Status: DC | PRN
  Filled 2019-07-25: qty 2

## 2019-07-25 SURGICAL SUPPLY — 48 items
APPLICATOR CHLORAPREP 10.5 ORG (MISCELLANEOUS) ×1 IMPLANT
BAG DECANTER FOR FLEXI CONT (MISCELLANEOUS) ×2 IMPLANT
BIOPATCH RED 1 DISK 7.0 (GAUZE/BANDAGES/DRESSINGS) ×2 IMPLANT
CATH PALINDROME-P 23CM W/VT (CATHETERS) ×1 IMPLANT
COVER LIGHT HANDLE STERIS (MISCELLANEOUS) ×4 IMPLANT
COVER PROBE U/S 5X48 (MISCELLANEOUS) ×2 IMPLANT
COVER WAND RF STERILE (DRAPES) ×2 IMPLANT
DECANTER SPIKE VIAL GLASS SM (MISCELLANEOUS) ×4 IMPLANT
DERMABOND ADVANCED (GAUZE/BANDAGES/DRESSINGS) ×1
DERMABOND ADVANCED .7 DNX12 (GAUZE/BANDAGES/DRESSINGS) ×1 IMPLANT
DRAPE C-ARM FOLDED MOBILE STRL (DRAPES) ×2 IMPLANT
DRAPE CHEST BREAST 15X10 FENES (DRAPES) ×2 IMPLANT
DRAPE HALF SHEET 40X57 (DRAPES) ×1 IMPLANT
DRSG SORBAVIEW 3.5X5-5/16 MED (GAUZE/BANDAGES/DRESSINGS) ×2 IMPLANT
ELECT REM PT RETURN 9FT ADLT (ELECTROSURGICAL) ×2
ELECTRODE REM PT RTRN 9FT ADLT (ELECTROSURGICAL) ×1 IMPLANT
GAUZE 4X4 16PLY RFD (DISPOSABLE) ×2 IMPLANT
GEL ULTRASOUND 20GR AQUASONIC (MISCELLANEOUS) ×2 IMPLANT
GLOVE BIO SURGEON STRL SZ 6.5 (GLOVE) ×2 IMPLANT
GLOVE BIOGEL PI IND STRL 6.5 (GLOVE) ×1 IMPLANT
GLOVE BIOGEL PI IND STRL 7.0 (GLOVE) ×2 IMPLANT
GLOVE BIOGEL PI INDICATOR 6.5 (GLOVE) ×1
GLOVE BIOGEL PI INDICATOR 7.0 (GLOVE) ×2
GLOVE ECLIPSE 6.5 STRL STRAW (GLOVE) ×1 IMPLANT
GOWN STRL REUS W/TWL LRG LVL3 (GOWN DISPOSABLE) ×4 IMPLANT
IV CONNECTOR ONE LINK NDLESS (IV SETS) IMPLANT
IV NS 500ML (IV SOLUTION) ×1
IV NS 500ML BAXH (IV SOLUTION) ×1 IMPLANT
KIT BLADEGUARD II DBL (SET/KITS/TRAYS/PACK) ×2 IMPLANT
KIT TURNOVER KIT A (KITS) ×2 IMPLANT
MARKER SKIN DUAL TIP RULER LAB (MISCELLANEOUS) ×2 IMPLANT
NDL HYPO 18GX1.5 BLUNT FILL (NEEDLE) ×1 IMPLANT
NDL HYPO 25X1 1.5 SAFETY (NEEDLE) ×1 IMPLANT
NEEDLE HYPO 18GX1.5 BLUNT FILL (NEEDLE) ×2 IMPLANT
NEEDLE HYPO 25X1 1.5 SAFETY (NEEDLE) ×2 IMPLANT
PACK BASIC III (CUSTOM PROCEDURE TRAY) ×1
PACK SRG BSC III STRL LF ECLPS (CUSTOM PROCEDURE TRAY) ×1 IMPLANT
PAD ARMBOARD 7.5X6 YLW CONV (MISCELLANEOUS) ×2 IMPLANT
PENCIL HANDSWITCHING (ELECTRODE) ×2 IMPLANT
SET BASIN LINEN APH (SET/KITS/TRAYS/PACK) ×2 IMPLANT
SUT MNCRL AB 4-0 PS2 18 (SUTURE) ×2 IMPLANT
SUT SILK 2 0 FSL 18 (SUTURE) ×2 IMPLANT
SUT VIC AB 3-0 SH 27 (SUTURE) ×1
SUT VIC AB 3-0 SH 27X BRD (SUTURE) IMPLANT
SYR 10ML LL (SYRINGE) ×2 IMPLANT
SYR 20ML LL LF (SYRINGE) ×1 IMPLANT
SYR 5ML LL (SYRINGE) ×2 IMPLANT
SYR CONTROL 10ML LL (SYRINGE) ×2 IMPLANT

## 2019-07-25 NOTE — Progress Notes (Signed)
Patient ID: [name], female   DOB: 10-May-1951, 68 y.o.   MRN: [id number] S: She feels well today and denies any back pain. O:BP 126/82 (BP Location: Left Arm)   Pulse 80   Temp 98.3 F (36.8 C) (Oral)   Resp 16   Ht 5\' 7"  (1.702 m)   Wt 63.3 kg   SpO2 99%   BMI 21.86 kg/m   Intake/Output Summary (Last 24 hours) at 07/25/2019 1028 Last data filed at 07/25/2019 0500 Gross per 24 hour  Intake -  Output 0 ml  Net 0 ml   Intake/Output: No intake/output data recorded.  Intake/Output this shift:  No intake/output data recorded. Weight change: 2.5 kg Gen:NAD CVS: no rub Resp: cta Abd: +BS, soft, NT/ND Ext: no edema  Recent Labs  Lab 07/19/19 1336 07/20/19 1445 07/21/19 0819 07/22/19 0617 07/23/19 0621 07/24/19 0522 07/25/19 0407  NA 136 137 138 140 137 140 134*  K 4.3 4.9 5.6* 5.1 4.8 4.5 4.7  CL 100 98 101 100 95* 100 95*  CO2 25 28 20* 23 20* 26 23  GLUCOSE 145* 152* 144* 104* 171* 129* 128*  BUN 28* 42* 49* 58* 27* 47* 61*  CREATININE 4.44* 6.39* 7.00* 8.33* 4.97* 7.08* 8.34*  ALBUMIN 2.9* 3.1* 3.1*  --   --   --   --   CALCIUM 8.1* 8.5* 8.7* 8.9 9.0 8.8* 8.4*  AST 31 16 25   --   --   --   --   ALT 20 16 16   --   --   --   --    Liver Function Tests: Recent Labs  Lab 07/19/19 1336 07/20/19 1445 07/21/19 0819  AST 31 16 25   ALT 20 16 16   ALKPHOS 104 116 116  BILITOT 0.6 0.5 0.6  PROT 7.4 8.3* 8.0  ALBUMIN 2.9* 3.1* 3.1*   No results for input(s): LIPASE, AMYLASE in the last 168 hours. No results for input(s): AMMONIA in the last 168 hours. CBC: Recent Labs  Lab 07/19/19 1454 07/20/19 1445 07/21/19 0819 07/22/19 0617 07/23/19 0621 07/24/19 0522 07/25/19 0407  WBC 22.8* 19.1* 17.4* 14.3* 15.3* 11.5* 9.0  NEUTROABS 20.7* 15.4*  --   --   --   --   --   HGB 11.5* 12.2 13.5 12.7 13.5 13.9 11.8*  HCT 37.5 41.4 44.2 42.6 46.6* 47.0* 39.4  MCV 90.1 92.2 88.9 92.2 91.0 92.2 90.2  PLT 213 203 230 233 277 250 250   Cardiac Enzymes: No results  for input(s): CKTOTAL, CKMB, CKMBINDEX, TROPONINI in the last 168 hours. CBG: Recent Labs  Lab 07/23/19 2251 07/24/19 0804 07/24/19 1627 07/24/19 2123 07/25/19 0741  GLUCAP 104* 154* 135* 194* 169*    Iron Studies: No results for input(s): IRON, TIBC, TRANSFERRIN, FERRITIN in the last 72 hours. Studies/Results: No results found. Marland Kitchen amLODipine  10 mg Oral QPC lunch  . aspirin EC  81 mg Oral QPC lunch  . calcium carbonate  1 tablet Oral BID  . carvedilol  6.25 mg Oral BID  . Chlorhexidine Gluconate Cloth  6 each Topical Q0600  . clopidogrel  75 mg Oral Daily  . feeding supplement (NEPRO CARB STEADY)  237 mL Oral BID BM  . insulin aspart  0-5 Units Subcutaneous QHS  . insulin aspart  0-9 Units Subcutaneous TID WC  . isosorbide mononitrate  30 mg Oral QPC lunch  . multivitamin  1 tablet Oral QPC lunch  . mupirocin ointment  1 application Nasal BID  .  sodium chloride flush  3 mL Intravenous Q12H    BMET    Component Value Date/Time   NA 134 (L) 07/25/2019 0407   K 4.7 07/25/2019 0407   CL 95 (L) 07/25/2019 0407   CO2 23 07/25/2019 0407   GLUCOSE 128 (H) 07/25/2019 0407   BUN 61 (H) 07/25/2019 0407   CREATININE 8.34 (H) 07/25/2019 0407   CALCIUM 8.4 (L) 07/25/2019 0407   GFRNONAA 4 (L) 07/25/2019 0407   GFRAA 5 (L) 07/25/2019 0407   CBC    Component Value Date/Time   WBC 9.0 07/25/2019 0407   RBC 4.37 07/25/2019 0407   HGB 11.8 (L) 07/25/2019 0407   HCT 39.4 07/25/2019 0407   PLT 250 07/25/2019 0407   MCV 90.2 07/25/2019 0407   MCH 27.0 07/25/2019 0407   MCHC 29.9 (L) 07/25/2019 0407   RDW 15.6 (H) 07/25/2019 0407   LYMPHSABS 2.2 07/20/2019 1445   MONOABS 1.3 (H) 07/20/2019 1445   EOSABS 0.1 07/20/2019 1445   BASOSABS 0.0 07/20/2019 1445    Assessment/Plan:  1. Pseudomonas bacteremia- On cefepime and TDC removed 07/23/19.  Tip culture negative.  Per ID she is to receive total of 6 weeks of IV antibiotics due to concern for discitis/osteo of L3/L4.  At time of  discharge will need to change abx to [facility] 2 grams with HD as an outpatient to complete the 6 week course.  WBC improving, afebrile, and back pain improved. 2. Vascular access- TDC removed 07/23/19.  Appreciate Dr. Constance Haw assistance with [facility] placement today.  Will plan for HD afterwards.  3. ESRD- normally MWF, off schedule due to hospitalization and last HD 07/22/19.  Will plan for HD today after [facility] placement and can get back on schedule tomorrow either here or as an outpatient.  [facility] 2 grams with HD qMWF for total of 6 weeks as above 4. Possible discitis of L3/L4- on abx.  For repeat MRI in 3-4 weeks to document stability and agree with prolonged course of IV Abx.  5. Acute metabolic encephalopathy- improving.  Neuro following and await recommendations. 6. Disposition- possible discharge to home today after HD if remains stable and she is able to ambulate on her own.  If she stays will plan for HD tomorrow to get back on schedule  Donetta Potts, MD [facility] [phone]

## 2019-07-25 NOTE — Interval H&P Note (Signed)
History and Physical Interval Note:  07/25/2019 12:28 PM  [name]  has presented today for surgery, with the diagnosis of end stage renal disease.  The various methods of treatment have been discussed with the patient and family. After consideration of risks, benefits and other options for treatment, the patient has consented to  Procedure(s): INSERTION OF DIALYSIS CATHETER (Right) as a surgical intervention.  The patient's history has been reviewed, patient examined, no change in status, stable for surgery.  I have reviewed the patient's chart and labs.  Questions were answered to the patient's satisfaction.    Right IJ found with [name] in Preop. Appears open and compressible. Discussed risk with patient of bleeding which is higher with Plavix, infection, pneumothorax, need for groin line temporarily and another attempt at tunneled line at [facility] if unsuccessful.   Virl Cagey

## 2019-07-25 NOTE — Transfer of Care (Signed)
Immediate Anesthesia Transfer of Care Note  Patient: [name]  Procedure(s) Performed: INSERTION OF DIALYSIS CATHETER (Right )  Patient Location: PACU  Anesthesia Type:MAC  Level of Consciousness: awake, alert , oriented and patient cooperative  Airway & Oxygen Therapy: Patient Spontanous Breathing and Patient connected to nasal cannula oxygen  Post-op Assessment: Report given to RN and Post -op Vital signs reviewed and stable  Post vital signs: Reviewed and stable  Last Vitals:  Vitals Value Taken Time  BP 103/72 07/25/19 1350  Temp    Pulse 86 07/25/19 1354  Resp 21 07/25/19 1354  SpO2 98 % 07/25/19 1354  Vitals shown include unvalidated device data.  Last Pain:  Vitals:   07/25/19 1121  TempSrc:   PainSc: 0-No pain      Patients Stated Pain Goal: 0 (21/22/48 2500)  Complications: No apparent anesthesia complications

## 2019-07-25 NOTE — Op Note (Addendum)
Operative Note 07/25/19   Preoperative Diagnosis: End Stage Renal Disease    Postoperative Diagnosis: Same   Procedure(s) Performed: Tunneled Dialysis Catheter Placement, Right Internal Jugular    Surgeon: Lanell Matar. Constance Haw, MD   Assistants: No qualified resident was available   Anesthesia: Monitored anesthesia care   Anesthesiologist: Lenice Llamas, MD    Specimens: None   Estimated Blood Loss: Minimal   Fluoroscopy time: 24 seconds   Blood Replacement: None    Complications: None    Operative Findings: Open jugular vein on right with some superficial scarring of the subcutaneous tissue   Indications: Ms. Mockus was admitted with pseudomonas bacteremia and had her tunneled right internal jugular catheter removed due to the bacteremia.  The patient was on a line holiday for several days. She still needs dialysis access. She has had failed left arm fistulas and grafts, and possible central stenosis on the left after prior venogram 10/2018. We discussed the placement of a right internal jugular tunneled catheter and the risk of bleeding, which was higher given the use of Plavix, the risk of infection, pneumothorax, or injury to vessels. She and her son opted to proceed with replacement of the right.     Procedure: The patient was brought into the operating room and monitored anesthesia care was induced.   The right chest and neck was prepped and draped in the usual sterile fashion.  Preoperative antibiotics were being given scheduled for the bacteremia and this was not re-dosed given the ESRD.   An Ultrasound was used to verify that the right internal jugular vein was patent.  One percent lidocaine was used for local anesthesia.  The patient was measured and a 23 cm Palindrome dual lumen dialysis catheter was used (this was the prior size she had from 07/2018).The needles advanced into the right internal jugular vein using the Seldinger technique without difficulty.  A guidewire was  then advanced into the right atrium under fluoroscopic guidance.  Ectopia was not noted.  The wire was secured.  An incision was made over the right chest and the catheter was tunneled to the neck.  The ultrasound again confirmed the wire was going into the vein only. Dilators were used over the wire to dilate the track.  An introducer and peel-away sheath were placed over the guidewire. The catheter was then inserted through the peel-away sheath and the peel-away sheath was removed.  A spot film was performed to confirm the position.  The catheter drew back and flushed easily. The lumens were packed with heparin. Hemostats were used to position the catheter in the neck incision. The neck incision was closed with 4-0 Monocryl and Dermabond. The catheter was secured with 2-0 silk suture and a sterile Biopatch and dressing was applied.  Hemostasis was confirmed.     All tape and needle counts were correct at the end of the procedure. The patient was transferred to PACU in stable condition. A chest x-ray will be performed at that time.  Curlene Labrum, MD [facility] [address] [place], [place] [address] [phone] (office)

## 2019-07-25 NOTE — Evaluation (Signed)
Physical Therapy Evaluation Patient Details Name: [name] MRN: [id number] DOB: 1951/02/20 Today's Date: 07/25/2019   History of Present Illness  [name]  is a 68 y.o. female, w hypertension, hyperlipidemia, Dm2 w ESRD on HD (M,W, F), h/o CVA, [name], apparently presents due to being called by ER for bacteremia, (gram negative rod), on blood culture 07/19/2019.  Pt was seen yesterday for AMS. Appears wnl today.    Clinical Impression  Patient presents slightly lethargic, limited use of LUE due to s/p procedure, unsteady on feet with frequent near loss of balance, had to use RW for safety, patient tends to veer to the left requiring frequent verbal/tactile cueing to prevent bumping into objects and making turns using RW.  Patient tolerated sitting up in chair after therapy with her son present in room.  Patient's son states he wants to take her home and will be able to take care of her and feels she will be doing a lot better after anesthesia wears off - RN/SW notified.  Patient will benefit from continued physical therapy in hospital and recommended venue below to increase strength, balance, endurance for safe ADLs and gait.    Follow Up Recommendations Home health PT;Supervision for mobility/OOB;Supervision/Assistance - 24 hour    Equipment Recommendations  Rolling walker with 5" wheels    Recommendations for Other Services       Precautions / Restrictions Precautions Precautions: Fall Restrictions Weight Bearing Restrictions: No      Mobility  Bed Mobility Overal bed mobility: Needs Assistance Bed Mobility: Supine to Sit     Supine to sit: Min guard;Min assist     General bed mobility comments: slow labored movement with limited use of LUE  Transfers Overall transfer level: Needs assistance Equipment used: None;Rolling walker (2 wheeled) Transfers: Sit to/from [facility] Sit to Stand: Min assist;Mod assist Stand pivot transfers: Min  assist;Mod assist       General transfer comment: requires much verbal tactile cueing to safely transfer to chair, limited use of LUE  Ambulation/Gait Ambulation/Gait assistance: Min assist;Mod assist Gait Distance (Feet): 35 Feet Assistive device: Rolling walker (2 wheeled) Gait Pattern/deviations: Decreased step length - right;Decreased step length - left;Decreased stride length Gait velocity: slow   General Gait Details: patient unsteady on feet and required the use of RW for safety, able to grip RW with left hand, but drifts to the left when walking and requires assistance to help turn RW around during ambulation  Stairs            Wheelchair Mobility    Modified Rankin (Stroke Patients Only)       Balance Overall balance assessment: Needs assistance Sitting-balance support: Feet supported;Bilateral upper extremity supported Sitting balance-Leahy Scale: Fair Sitting balance - Comments: seated at bedside   Standing balance support: During functional activity;No upper extremity supported Standing balance-Leahy Scale: Poor Standing balance comment: fair using RW                             Pertinent Vitals/Pain Pain Assessment: No/denies pain    Home Living Family/patient expects to be discharged to:: Private residence Living Arrangements: Other relatives Available Help at Discharge: Family Type of Home: House Home Access: Stairs to enter Entrance Stairs-Rails: Right;Left;Can reach both Technical brewer of Steps: 3 Home Layout: One level Home Equipment: Cane - single point      Prior Function Level of Independence: Independent  Comments: household and short distanced community ambulator, drives     Journalist, newspaper        Extremity/Trunk Assessment   Upper Extremity Assessment Upper Extremity Assessment: Overall WFL for tasks assessed;LUE deficits/detail LUE Deficits / Details: grossly -3/5, except hand grip grossly 4/5,  unable to lift arm against gravity, arm very cold to touch    Lower Extremity Assessment Lower Extremity Assessment: Generalized weakness    Cervical / Trunk Assessment Cervical / Trunk Assessment: Normal  Communication   Communication: No difficulties  Cognition Arousal/Alertness: Awake/alert Behavior During Therapy: WFL for tasks assessed/performed Overall Cognitive Status: Within Functional Limits for tasks assessed                                 General Comments: slightly lethargic possibly due to anesthesia after procedure today      General Comments      Exercises     Assessment/Plan    PT Assessment Patient needs continued PT services  PT Problem List Decreased strength;Decreased activity tolerance;Decreased balance;Decreased mobility       PT Treatment Interventions Gait training;Balance training;Stair training;Functional mobility training;Therapeutic activities;Therapeutic exercise;Patient/family education    PT Goals (Current goals can be found in the Care Plan section)  Acute Rehab PT Goals Patient Stated Goal: return home with family to assist PT Goal Formulation: With patient/family Time For Goal Achievement: 07/27/19 Potential to Achieve Goals: Good    Frequency Min 3X/week   Barriers to discharge        Co-evaluation               AM-PAC PT "6 Clicks" Mobility  Outcome Measure Help needed turning from your back to your side while in a flat bed without using bedrails?: A Little Help needed moving from lying on your back to sitting on the side of a flat bed without using bedrails?: A Little Help needed moving to and from a bed to a chair (including a wheelchair)?: A Lot Help needed standing up from a chair using your arms (e.g., wheelchair or bedside chair)?: A Little Help needed to walk in hospital room?: A Lot Help needed climbing 3-5 steps with a railing? : A Lot 6 Click Score: 15    End of Session Equipment Utilized  During Treatment: Gait belt Activity Tolerance: Patient tolerated treatment well;Patient limited by fatigue;Patient limited by lethargy Patient left: in chair;with call bell/phone within reach;with family/visitor present Nurse Communication: Mobility status PT Visit Diagnosis: Unsteadiness on feet (R26.81);Other abnormalities of gait and mobility (R26.89);Muscle weakness (generalized) (M62.81)    Time: [id number] PT Time Calculation (min) (ACUTE ONLY): 23 min   Charges:   PT Evaluation $PT Eval Moderate Complexity: 1 Mod PT Treatments $Therapeutic Activity: 23-37 mins        4:04 PM, 07/25/19 Lonell Grandchild, MPT Physical Therapist with [facility] [phone] office [phone] mobile phone

## 2019-07-25 NOTE — Progress Notes (Signed)
PHARMACY CONSULT NOTE FOR:  OUTPATIENT  PARENTERAL ANTIBIOTIC THERAPY (OPAT)  Indication: Pseudomonas Bacteremia Regimen: Ceftazidime 2gm IV after HD on MWF End date: 09/05/19  IV antibiotic discharge orders are pended. To discharging provider:  please sign these orders via discharge navigator,  Select New Orders & click on the button choice - Manage This Unsigned Work.     Thank you for allowing pharmacy to be a part of this patient's care.  Isac Sarna, BS Vena Austria, BCPS Clinical Pharmacist Pager [phone] 07/25/2019, 11:06 AM

## 2019-07-25 NOTE — Progress Notes (Signed)
CXR with line in good position and no pneumothorax.  Curlene Labrum, MD

## 2019-07-25 NOTE — Plan of Care (Signed)
  Problem: Acute Rehab PT Goals(only PT should resolve) Goal: Pt Will Go Supine/Side To Sit Outcome: Progressing Flowsheets (Taken 07/25/2019 1606) Pt will go Supine/Side to Sit: with modified independence Goal: Patient Will Transfer Sit To/From Stand Outcome: Progressing Flowsheets (Taken 07/25/2019 1606) Patient will transfer sit to/from stand: with supervision Goal: Pt Will Transfer Bed To Chair/Chair To Bed Outcome: Progressing Flowsheets (Taken 07/25/2019 1606) Pt will Transfer Bed to Chair/Chair to Bed: with supervision Goal: Pt Will Ambulate Outcome: Progressing Flowsheets (Taken 07/25/2019 1606) Pt will Ambulate:  50 feet  with min guard assist  with rolling walker  with cane   4:06 PM, 07/25/19 Lonell Grandchild, MPT Physical Therapist with [facility] [phone] office [phone] mobile phone

## 2019-07-25 NOTE — Anesthesia Preprocedure Evaluation (Signed)
Anesthesia Evaluation  Patient identified by MRN, date of birth, ID band Patient awake    Reviewed: Allergy & Precautions, NPO status , Patient's Chart, lab work & pertinent test results  Airway Mallampati: I  TM Distance: >3 FB Neck ROM: Full    Dental no notable dental hx. (+) Upper Dentures   Pulmonary neg pulmonary ROS,    Pulmonary exam normal breath sounds clear to auscultation       Cardiovascular Exercise Tolerance: Good hypertension, Pt. on medications negative cardio ROS Normal cardiovascular examI Rhythm:Regular Rate:Normal     Neuro/Psych  Headaches, CVA, No Residual Symptoms negative psych ROS   GI/Hepatic Neg liver ROS, GERD  Medicated and Controlled,  Endo/Other  negative endocrine ROSdiabetes  Renal/GU ESRF and DialysisRenal diseaseESRD  HD M,W, F  Labs ok this am  Here for Dialysis port today   negative genitourinary   Musculoskeletal  (+) Arthritis , Osteoarthritis,    Abdominal   Peds negative pediatric ROS (+)  Hematology negative hematology ROS (+) anemia ,   Anesthesia Other Findings   Reproductive/Obstetrics negative OB ROS                             Anesthesia Physical Anesthesia Plan  ASA: IV  Anesthesia Plan: MAC   Post-op Pain Management:    Induction: Intravenous  PONV Risk Score and Plan: 2 and TIVA and Propofol infusion  Airway Management Planned: Nasal Cannula and Simple Face Mask  Additional Equipment:   Intra-op Plan:   Post-operative Plan:   Informed Consent: I have reviewed the patients History and Physical, chart, labs and discussed the procedure including the risks, benefits and alternatives for the proposed anesthesia with the patient or authorized representative who has indicated his/her understanding and acceptance.     Dental advisory given  Plan Discussed with: CRNA  Anesthesia Plan Comments: (Plan Full PPE use  MAC as  tolerated GA vs GETA as needed d/w pt-WTP with same )        Anesthesia Quick Evaluation

## 2019-07-25 NOTE — Progress Notes (Signed)
[facility]  Notified [name], Son, that procedure went well and awaiting CXR. Will let HD RN know as well.  Curlene Labrum, MD [facility] [address] [place], [place] [address] [phone] (office)

## 2019-07-25 NOTE — TOC Transition Note (Signed)
Transition of Care [facility]) - CM/SW Discharge Note   Patient Details  Name: [name] MRN: [id number] Date of Birth: 1951-08-10  [facility]) CM/SW Contact:  Ihor Gully, LCSW Phone Number: 07/25/2019, 5:12 PM   Clinical Narrative:    Message left for PCP office in an attempt to schedule follow up visit.  HH ordered through [employer] [facility]. Orders and discharge information faxed. RW ordered through [facility]. Orders and discharge information faxed.  Discharge summary faxed to [facility] in [place] and RN at center was informed of patient's need for IV abx.  LCSW signing off.      Barriers to Discharge: Continued Medical Work up   Patient Goals and CMS Choice        Discharge Placement                       Discharge Plan and Services                                     Social Determinants of Health (SDOH) Interventions     Readmission Risk Interventions Readmission Risk Prevention Plan 07/24/2019  Transportation Screening Complete  Palliative Care Screening Not Applicable  Medication Review (RN Care Manager) Complete  Some recent data might be hidden

## 2019-07-25 NOTE — Discharge Summary (Signed)
Physician Discharge Summary  [name] MAU:[id number] DOB: 23-Nov-1951 DOA: 07/20/2019  PCP: Manon Hilding, MD  Admit date: 07/20/2019  Discharge date: 07/25/2019  Admitted From:Home  Disposition:  Home  Recommendations for Outpatient Follow-up:  1. Follow up with PCP in 1-2 weeks 2. Continue hemodialysis per routine schedule 3. Continue on Fortaz 2 g with each hemodialysis session until 09/05/2019 for total 6-week course of treatment as recommended by ID.  Will need close follow-up with ID as scheduled 4. Repeat lumbar MRI in 3 to 4 weeks to ensure stability and reevaluation of discitis  Home Health: We will set up with PT  Equipment/Devices: None  Discharge Condition: Stable  CODE STATUS: Full  Diet recommendation: Heart Healthy/carb modified  Brief/Interim Summary: Per HPI: [name] a68 y.o.female,w hypertension, hyperlipidemia, Dm2 w ESRD on HD (M,W, F), h/o CVA, Jerrye Bushy, apparently presents due to being called by ER for bacteremia, (gram negative rod), on blood culture 07/19/2019. Pt was seen yesterday for AMS. Appears wnl today.  8/28:Patient was admitted with gram-negative rod bacteremia which now is confirmed to be Pseudomonas bacteremia with concern for infectious discitis/osteomyelitis at L3/L4. She has been started on cefepime with sensitivities still pending. Nephrology has evaluated patient with plans for hemodialysis later today. She was struggling with hypertension earlier this morning that improved with use of labetalol and continues to have some ongoing nausea with poor response to Zofran. Patient will likely require new catheter placement versus exchange soon.  8/29:Patient noted to have periods of agitation this morning with some hallucinations for which Haldol has been given. She maintains a persistent tachycardia. Plans for hemodialysis today per nephrology and likely catheter removal in a.m. Continue on cefepime as patient is  pansensitive. 2D echocardiogram without vegetations noted.  8/30: Patient has had brief hemodialysis on 8/29 which had to be discontinued early on account of low blood pressure readings. She appears improved and mentation this morning and is more pleasant and less agitated, however she still having ongoing hallucinations. EEG pending and patient will be started on Keppra this morning empirically.  8/31: Patient is overall much improved this morning.  EEG still pending.  Plans for placement of tunneled dialysis catheter in a.m. per general surgery.  Discussed case with ID who recommends 6 weeks of IV antibiotics with ceftazidime after hemodialysis sessions.  Likely can be discharged in a.m. if stable.  9/1: Patient is overall much improved.  EEG with no signs of seizure activity and Keppra has been discontinued.  She is back to her baseline level of mentation and has undergone tunneled hemodialysis catheter placement of a right IJ with general surgery.  She is to remain on ceftazidime as prescribed after each hemodialysis session for a total of 6 weeks and have repeat MRI of the lumbar spine in 3 to 4 weeks.  She will have close follow-up with ID as well.  Resume usual hemodialysis sessions as prior.  No other acute events noted throughout the course of this admission.  Will have PT evaluation and set up for home health physical therapy as needed.  Discharge Diagnoses:  Principal Problem:   Bacteremia Active Problems:   ESRD needing dialysis [facility])   Essential hypertension   Type 2 diabetes mellitus with diabetic nephropathy ([place])  Principal discharge diagnosis: Sepsis secondary to Pseudomonas bacteremia with discitis/osteomyelitis of L3/L4.  Discharge Instructions  Discharge Instructions    Diet - low sodium heart healthy   Complete by: As directed    Home infusion instructions [place] May  follow [place] Dosing Protocol; May administer Cathflo as needed to maintain patency  of vascular access device.; [place]ushing of vascular access device: per [facility] Protocol: 0.9% NaCl pre/post medica...   Complete by: As directed    Instructions: May follow [place] Dosing Protocol   Instructions: May administer Cathflo as needed to maintain patency of vascular access device.   Instructions: [place]ushing of vascular access device: per [facility] Protocol: 0.9% NaCl pre/post medication administration and prn patency; Heparin 100 u/ml, 57m for implanted ports and Heparin 10u/ml, 545mfor all other central venous catheters.   Instructions: May follow [facility] Anaphylaxis Protocol for First Dose Administration in the home: 0.9% NaCl at 25-50 ml/hr to maintain IV access for protocol meds. Epinephrine 0.3 ml IV/IM PRN and Benadryl 25-50 IV/IM PRN s/s of anaphylaxis.   Instructions: [facility] Coordinator (RN) to assist per patient IV care needs in the home PRN.   Increase activity slowly   Complete by: As directed      Allergies as of 07/25/2019   No Known Allergies     Medication List    STOP taking these medications   predniSONE 10 MG tablet Commonly known as: DELTASONE   sulfamethoxazole-trimethoprim 400-80 MG tablet Commonly known as: Bactrim     TAKE these medications   amLODipine 10 MG tablet Commonly known as: NORVASC Take 10 mg by mouth daily after lunch.   aspirin EC 81 MG tablet Take 81 mg by mouth daily after lunch.   calcium carbonate 500 MG chewable tablet Commonly known as: TUMS - dosed in mg elemental calcium Chew 1 tablet by mouth 2 (two) times daily. With lunch & supper   carvedilol 6.25 MG tablet Commonly known as: COREG Take 6.25 mg by mouth 2 (two) times daily.   cefTAZidime  IVPB Commonly known as: FORTAZ Inject 2 g into the vein every Monday, Wednesday, and Friday with hemodialysis. Indication:  Pseudomonas Bacteremia Last Day of Therapy:  09/04/19 Labs - Once weekly:  CBC/D and BMP, Labs - Every other week:  ESR and CRP Start taking on:  July 26, 2019   clopidogrel 75 MG tablet Commonly known as: Plavix Take 1 tablet (75 mg total) by mouth daily.   feeding supplement (NEPRO CARB STEADY) Liqd Take 237 mLs by mouth 2 (two) times daily between meals.   fluticasone 50 MCG/ACT nasal spray Commonly known as: FLONASE Place 1 spray into both nostrils daily as needed for allergies or rhinitis.   insulin NPH-regular Human (70-30) 100 UNIT/ML injection Inject 10-20 Units into the skin See admin instructions. Use twice daily per sliding scale: Blood sugar less than 150=12 units & Blood sugar 200 or more=20 units   isosorbide mononitrate 30 MG 24 hr tablet Commonly known as: IMDUR Take 30 mg by mouth daily after lunch.   multivitamin Tabs tablet Take 1 tablet by mouth daily after lunch.            Home Infusion Instuctions  (From admission, onward)         Start     Ordered   07/25/19 0000  Home infusion instructions [facility] May follow [facility] Protocol; May administer Cathflo as needed to maintain patency of vascular access device.; Flushing of vascular access device: [facility]: 0.9% NaCl pre/post medica...    Question Answer Comment  Instructions [place] follow [facility] Protocol   Instructions May administer Cathflo as needed to maintain patency of vascular access device.   Instructions [place]ushing  of vascular access device: per [facility] Protocol: 0.9% NaCl pre/post medication administration and prn patency; Heparin 100 u/ml, 36m for implanted ports and Heparin 10u/ml, 558mfor all other central venous catheters.   Instructions May follow [facility] Anaphylaxis Protocol for First Dose Administration in the home: 0.9% NaCl at 25-50 ml/hr to maintain IV access for protocol meds. Epinephrine 0.3 ml IV/IM PRN and Benadryl 25-50 IV/IM PRN s/s of anaphylaxis.   Instructions [facility] Infusion Coordinator (RN) to assist per patient IV care needs in the home PRN.      07/25/19 1104          Follow-up Information    BrVirl CageyMD.   Specialty: General Surgery Why: As needed; [facility] will care for your line and any issues that arise.   Contact information: [address]eLinna HoffCTrinity Medical [phone]        SaQuintin AltoaSilvestre MomentMD Follow up in 1 week(s).   Specialty: Family Medicine Contact information: [address] [phone]        SnCarlyle BasquesMD Follow up in 4 week(s).   Specialty: Infectious Diseases Contact information: [address] [phone]        No Known Allergies  Consultations:  Nephrology  ID-Dr. SnBaxter Flatteryn phone  General surgery   Procedures/Studies: Dg Chest 2 View  Result Date: 07/20/2019 CLINICAL DATA:  Abnormal labs, suspected sepsis. Sob. EXAM: CHEST - 2 VIEW COMPARISON:  Chest radiograph 07/19/2019, 08/18/2018 FINDINGS: Stable cardiomediastinal contours. Atherosclerotic calcification in the aortic arch. Stable position of a right central venous catheter. Possible developing infiltrate in the right lower lobe. No pneumothorax or pleural effusion. No acute abnormality in the visualized skeleton or upper abdomen. IMPRESSION: Possible developing infiltrate in the right lower lobe. Electronically Signed   By: NaAudie Pinto.D.   On: 07/20/2019 13:42   Dg Chest 2 View  Result Date: 07/19/2019 CLINICAL DATA:  Sepsis EXAM: CHEST - 2 VIEW COMPARISON:  August 18, 2018 FINDINGS: There is no edema or consolidation. Heart is upper normal in size with pulmonary vascularity normal. There is aortic atherosclerosis. No adenopathy. Central catheter tip is at the cavoatrial junction. No pneumothorax. No bone lesions. IMPRESSION: No edema or consolidation. Stable cardiac silhouette. No adenopathy. Central catheter tip at cavoatrial junction. No pneumothorax. Aortic Atherosclerosis (ICD10-I70.0). Electronically Signed   By: WiLowella GripII M.D.    On: 07/19/2019 14:05   Ct Head Wo Contrast  Result Date: 07/19/2019 CLINICAL DATA:  89ear old female with altered mental status after dialysis this morning. EXAM: CT HEAD WITHOUT CONTRAST TECHNIQUE: Contiguous axial images were obtained from the base of the skull through the vertex without intravenous contrast. COMPARISON:  Brain MRI 03/03/2018.  Head CT 08/22/2010. FINDINGS: Brain: Generalized cerebral volume loss since the 2011 CT. No ventriculomegaly. No midline shift, mass effect, or evidence of intracranial mass lesion. No acute intracranial hemorrhage identified. Stable small chronic infarcts in the left cerebellum and left thalamus. No definite cortical encephalomalacia. No cortically based acute infarct identified. Vascular: Extensive Calcified atherosclerosis at the skull base. No suspicious intracranial vascular hyperdensity. Skull: No acute osseous abnormality identified. Sinuses/Orbits: Visualized paranasal sinuses and mastoids are stable and well pneumatized. Other: Stable orbit and scalp soft tissues. IMPRESSION: No acute intracranial abnormality identified. Chronic small vessel disease appears stable since the 2019 MRI. Electronically Signed   By: H Genevie Ann.D.   On: 07/19/2019 14:04  Mr Brain 72 Contrast  Result Date: 07/19/2019 CLINICAL DATA:  68 year old female with altered mental status after dialysis this morning. Back pain on the right radiating to the hip and down the right leg since this morning. Fall several weeks ago. EXAM: MRI HEAD WITHOUT CONTRAST TECHNIQUE: Multiplanar, multiecho pulse sequences of the brain and surrounding structures were obtained without intravenous contrast. COMPARISON:  Head CT and CT Abdomen and Pelvis earlier today. [facility] Brain MRI 03/03/2018. FINDINGS: Brain: No restricted diffusion to suggest acute infarction. No midline shift, mass effect, evidence of mass lesion, ventriculomegaly, extra-axial collection or acute intracranial  hemorrhage. Cervicomedullary junction and pituitary are within normal limits. Small chronic infarcts in the left cerebellum and left thalamus are stable. Widespread increased perivascular spaces are stable. Mild for age cerebral white matter T2 and FLAIR hyperintensity. No cortical encephalomalacia. No chronic blood products. Vascular: Major intracranial vascular flow voids are stable. Skull and upper cervical spine: Generalized decreased T1 marrow signal, probably related to renal osteodystrophy in this clinical setting. Otherwise negative visible cervical spine. Sinuses/Orbits: Stable, negative. Other: Mastoids remain clear. Grossly normal visible internal auditory structures. Scalp and face soft tissues appear negative. IMPRESSION: No acute intracranial abnormality. Stable chronic small vessel disease since 2019, primarily in the left thalamus and cerebellum. Electronically Signed   By: Genevie Ann M.D.   On: 07/19/2019 17:11   Mr Lumbar Spine Wo Contrast  Result Date: 07/19/2019 CLINICAL DATA:  68 year old female with altered mental status after dialysis this morning. Back pain on the right radiating to the hip and down the right leg since this morning. Fall several weeks ago. EXAM: MRI LUMBAR SPINE WITHOUT CONTRAST TECHNIQUE: Multiplanar, multisequence MR imaging of the lumbar spine was performed. No intravenous contrast was administered. COMPARISON:  Head CT and CT Abdomen and Pelvis earlier today. [facility] Brain MRI 03/03/2018. FINDINGS: Segmentation:  Normal on the comparison CT. Alignment: Stable straightening of lumbar lordosis as seen by CT earlier today. Vertebrae: Patchy marrow edema in both the L3 and L4 vertebral bodies. The pedicles and posterior elements appear spared. And there appear to be defects about the L3-L4 disc, probably allowing for herniation of disc material into the bodies. There is faint associated edema in the medial psoas muscles at this level (more so the right series  16, image 3), but no other regional soft tissue inflammation. Bone marrow signal is highly heterogeneous throughout the remaining visible spine and pelvis, with geographic areas of decreased T1 marrow signal throughout, but no associated marrow edema. The visible SI joints appear intact. Conus medullaris and cauda equina: Conus extends to the L1-L2 level. No lower spinal cord or conus signal abnormality. Paraspinal and other soft tissues: Stable from the earlier CT. A degree of renal atrophy is noted. Disc levels: Mild for age disc degeneration. There is disc bulging at L2-L3 through L5-S1. There is mild multifactorial spinal stenosis at L3-L4 when combined with posterior element hypertrophy and endplate spurring. And there is similar multifactorial moderate bilateral L3 through L5 neural foraminal stenosis. IMPRESSION: 1. Abnormal L3 and L4 vertebral bodies and intervening disc as seen by CT. Although Infectious Discitis Osteomyelitis is possible, this could also be the sequelae of hemodialysis related spondyloarthropathy and/or intravertebral disc herniations (Schmorl's nodes). Blood cultures today are pending, and if negative recommend a short interval repeat MRI without contrast (e.g. 3-4 weeks) to document stability. 2. Markedly heterogeneous marrow signal throughout the visible spine and pelvis, but absent marrow edema except #1. Favor renal osteodystrophy. 3.  Degenerative mild spinal stenosis at L3-L4, and moderate bilateral L3 through L5 neural foraminal stenosis. Electronically Signed   By: Genevie Ann M.D.   On: 07/19/2019 17:29   Dg Chest Port 1 View  Result Date: 07/25/2019 CLINICAL DATA:  Hemodialysis catheter insertion EXAM: PORTABLE CHEST 1 VIEW COMPARISON:  07/20/2019 FINDINGS: Right jugular central venous catheter tip at the cavoatrial junction unchanged in position. No pneumothorax. Mild left lower lobe atelectasis has developed in the interval. No edema or effusion. Right lung clear IMPRESSION:  Central venous catheter tip at the cavoatrial junction unchanged. Interval development of mild left lower lobe atelectasis.  No edema. Electronically Signed   By: Franchot Gallo M.D.   On: 07/25/2019 14:14   Dg C-arm 1-60 Min-no Report  Result Date: 07/25/2019 Fluoroscopy was utilized by the requesting physician.  No radiographic interpretation.   Ct Renal Stone Study  Result Date: 07/19/2019 CLINICAL DATA:  Flank pain, also recent fall EXAM: CT ABDOMEN AND PELVIS WITHOUT CONTRAST TECHNIQUE: Multidetector CT imaging of the abdomen and pelvis was performed following the standard protocol without IV contrast. COMPARISON:  None FINDINGS: Lower chest: Dependent ground-glass opacity in the lung bases likely reflective of atelectasis. Borderline cardiomegaly with trace pericardial effusion. Extensive atherosclerotic calcification of the coronary arteries. Calcification of the aortic leaflets. A dialysis catheter tip is partially visualized at the level of the right atrium. Hepatobiliary: No focal liver abnormality is seen. No gallstones, gallbladder wall thickening, or biliary dilatation. Pancreas: Unremarkable. No pancreatic ductal dilatation or surrounding inflammatory changes. Spleen: Normal in size without focal abnormality. Adrenals/Urinary Tract: Normal appearance of the adrenal glands. Both kidneys appear atrophic. A 14 13 mm fluid attenuation cysts is seen in the interpolar region right kidney. A smaller 5 mm hyperattenuating exophytic, potentially cystic lesion arising from the left upper pole. Similar 3 mm hyperattenuating lesion is seen in the posterior right upper pole. The smaller lesion is indeterminate on this noncontrast enhanced study. No visible urolithiasis or hydronephrosis. Mild left urothelial thickening at the renal pelvis is noted. Bladder is decompressed with circumferential wall thickening though this is somewhat greater than expected with mild perivesicular haze. Stomach/Bowel: Moderate  hiatal hernia. Distal stomach and duodenum are unremarkable. No small bowel dilatation or wall thickening. There is borderline dilation up to 8 mm of a fluid-filled appendix seen in the right lower quadrant without significant periappendiceal fat stranding or pericecal inflammation. Scattered colonic diverticula without focal pericolonic inflammation to suggest diverticulitis. Vascular/Lymphatic: Extensive atherosclerotic calcification is seen of the aorta and branch vessels. Evaluation for luminal stenosis is limited in absence of contrast media. No suspicious or enlarged lymph nodes in the included lymphatic chains. Reproductive: Uterus is surgically absent. No concerning adnexal lesions. Other: No abdominopelvic free fluid or free gas. No bowel containing hernias. Postsurgical changes of the anterior abdomen. Musculoskeletal: There is diffusely includes sclerosis of the L3 and L4 vertebral bodies with multilobulated lytic appearing lesions involving centered upon the disc space which also demonstrates central hypoattenuation. Please soft tissue thickening anteriorly at this level as well. Concerning lead there is an additional lucent lesion within the left sacral ala though this could reflect a separate process as the appearance is discs similar and is likely more compatible with irregular marrow heterogeneity in the setting of dialysis dependence. IMPRESSION: 1. Sclerosis of L3 and 4 with irregular lucencies in the end plates and hypoattenuation of the intervertebral disc space with adjacent hazy stranding. Findings are concerning for discitis/osteomyelitis. Recommend evaluation with a noncontrast MRI of the  lumbar spine for further evaluation given patient's renal compromise. 2. Presence of an additional lucent lesion in the left sacral ala however the appearance is dissimilar to the aforementioned lucency within L3 and L4. Finding could reflect irregular marrow heterogeneity in the setting of dialysis  dependence the malignancy is excluded. Will be better evaluated on MR as well. 3. Mild left urothelial thickening at the left renal pelvis and questionable thickening of the urinary bladder, nonspecific, but can be seen with ascending infection. Correlate with urinalysis. 4. Borderline dilation of a fluid-filled appendix up to 8 mm without significant periappendiceal fat stranding or pericecal inflammation. Most likely normal for this patient unless additional clinical findings are present to suggest appendicitis. 5. Moderate hiatal hernia. 6. Two subcentimeter, hyperattenuating, potentially cystic lesion arises from the upper pole of both kidneys. Consider further evaluation with nonemergent renal ultrasound for further evaluation. 7. Aortic Atherosclerosis (ICD10-I70.0). These results and recommendations were called by telephone at the time of interpretation on 07/19/2019 at 2:25 pm to Dr. Nanda Quinton , who verbally acknowledged these results. Electronically Signed   By: Lovena Le M.D.   On: 07/19/2019 14:26     Discharge Exam: Vitals:   07/25/19 1415 07/25/19 1439  BP: 135/85 (!) 142/84  Pulse: 84   Resp: 19 18  Temp: 98.2 F (36.8 C) 98.2 F (36.8 C)  SpO2: 96% 100%   Vitals:   07/25/19 1350 07/25/19 1400 07/25/19 1415 07/25/19 1439  BP: 103/72 125/90 135/85 (!) 142/84  Pulse: 86 63 84   Resp: '16 20 19 18  ' Temp: 98.3 F (36.8 C)  98.2 F (36.8 C) 98.2 F (36.8 C)  TempSrc:   Oral Oral  SpO2: 96% 100% 96% 100%  Weight:      Height:        General: Pt is alert, awake, not in acute distress Cardiovascular: RRR, S1/S2 +, no rubs, no gallops Respiratory: CTA bilaterally, no wheezing, no rhonchi Abdominal: Soft, NT, ND, bowel sounds + Extremities: no edema, no cyanosis    The results of significant diagnostics from this hospitalization (including imaging, microbiology, ancillary and laboratory) are listed below for reference.     Microbiology: Recent Results (from the past  240 hour(s))  Urine culture     Status: Abnormal   Collection Time: 07/19/19 12:19 PM   Specimen: Urine, Catheterized  Result Value Ref Range Status   Specimen Description   Final    URINE, CATHETERIZED Performed at [facility], [address] [place], [place] [address]    Special Requests   Final    Normal Performed at [facility], [address], [place], [place] [address]    Culture (A)  Final    <10,000 COLONIES/mL INSIGNIFICANT GROWTH Performed at [place] [address] [place], [place] [address]    Report Status 07/20/2019 FINAL  Final  Culture, blood (Routine x 2)     Status: Abnormal   Collection Time: 07/19/19  1:36 PM   Specimen: BLOOD RIGHT ARM  Result Value Ref Range Status   Specimen Description   Final    BLOOD RIGHT ARM Performed at [facility], [address] [place], [place] [address]    Special Requests   Final    BOTTLES DRAWN AEROBIC ONLY Blood Culture adequate volume Performed at [facility], [address] [place], [place] [address]    Culture  Setup Time   Final    GRAM NEGATIVE RODS AEROBIC BOTTLE ONLY Gram Stain Report Called to,Read  Back By and Verified With: HARTLEY,A'@[facility]'  @ 1100 BY MATTHEWS, B 8.27.2020 [facility] Organism ID to follow CRITICAL RESULT CALLED TO, READ BACK BY AND VERIFIED WITH: Marissa Nestle RN 07/20/19 1905 Performed at [facility], [place] [address], [place], [place] [address]    Culture PSEUDOMONAS AERUGINOSA (A)  Final   Report Status 07/22/2019 FINAL  Final   Organism ID, Bacteria PSEUDOMONAS AERUGINOSA  Final      Susceptibility   Pseudomonas aeruginosa - MIC*    CEFTAZIDIME 2 SENSITIVE Sensitive     CIPROFLOXACIN <=0.25 SENSITIVE Sensitive     GENTAMICIN <=1 SENSITIVE Sensitive     IMIPENEM 2 SENSITIVE Sensitive     PIP/TAZO <=4 SENSITIVE Sensitive     CEFEPIME <=1 SENSITIVE Sensitive     * PSEUDOMONAS AERUGINOSA  Culture, blood (Routine x 2)     Status: Abnormal   Collection Time: 07/19/19   1:36 PM   Specimen: BLOOD RIGHT HAND  Result Value Ref Range Status   Specimen Description   Final    BLOOD RIGHT HAND Performed at [facility], [address], [place], [place] [address]    Special Requests   Final    AEROBIC BOTTLE ONLY Blood Culture adequate volume Performed at [facility], [address], [place], [place] [address]    Culture  Setup Time   Final    AEROBIC BOTTLE ONLY GRAM NEGATIVE RODS CRITICAL VALUE NOTED.  VALUE IS CONSISTENT WITH PREVIOUSLY REPORTED AND CALLED VALUE.    Culture (A)  Final    PSEUDOMONAS AERUGINOSA SUSCEPTIBILITIES PERFORMED ON PREVIOUS CULTURE WITHIN THE LAST 5 DAYS. Performed at [facility], [place] [address] [place], [place] [address]    Report Status 07/22/2019 FINAL  Final  Blood Culture ID Panel (Reflexed)     Status: Abnormal   Collection Time: 07/19/19  1:36 PM  Result Value Ref Range Status   Enterococcus species NOT DETECTED NOT DETECTED Final   Listeria monocytogenes NOT DETECTED NOT DETECTED Final   Staphylococcus species NOT DETECTED NOT DETECTED Final   Staphylococcus aureus (BCID) NOT DETECTED NOT DETECTED Final   Streptococcus species NOT DETECTED NOT DETECTED Final   Streptococcus agalactiae NOT DETECTED NOT DETECTED Final   Streptococcus pneumoniae NOT DETECTED NOT DETECTED Final   Streptococcus pyogenes NOT DETECTED NOT DETECTED Final   Acinetobacter baumannii NOT DETECTED NOT DETECTED Final   Enterobacteriaceae species NOT DETECTED NOT DETECTED Final   Enterobacter cloacae complex NOT DETECTED NOT DETECTED Final   Escherichia coli NOT DETECTED NOT DETECTED Final   Klebsiella oxytoca NOT DETECTED NOT DETECTED Final   Klebsiella pneumoniae NOT DETECTED NOT DETECTED Final   Proteus species NOT DETECTED NOT DETECTED Final   Serratia marcescens NOT DETECTED NOT DETECTED Final   Carbapenem resistance NOT DETECTED NOT DETECTED Final   Haemophilus influenzae NOT DETECTED NOT DETECTED Final   Neisseria meningitidis  NOT DETECTED NOT DETECTED Final   Pseudomonas aeruginosa DETECTED (A) NOT DETECTED Final    Comment: CRITICAL RESULT CALLED TO, READ BACK BY AND VERIFIED WITH: Marissa Nestle RN 07/20/19 1905 JDW    Candida albicans NOT DETECTED NOT DETECTED Final   Candida glabrata NOT DETECTED NOT DETECTED Final   Candida krusei NOT DETECTED NOT DETECTED Final   Candida parapsilosis NOT DETECTED NOT DETECTED Final   Candida tropicalis NOT DETECTED NOT DETECTED Final    Comment: Performed at [facility], [address], [place], [place] [address]  Culture, blood (Routine x 2)  Status: None   Collection Time: 07/20/19  2:45 PM   Specimen: BLOOD LEFT HAND  Result Value Ref Range Status   Specimen Description BLOOD LEFT HAND  Final   Special Requests   Final    BOTTLES DRAWN AEROBIC ONLY Blood Culture adequate volume   Culture   Final    NO GROWTH 5 DAYS Performed at [facility], [address], [place], [place] [address]    Report Status 07/25/2019 FINAL  Final  Culture, blood (Routine x 2)     Status: None   Collection Time: 07/20/19  2:45 PM   Specimen: BLOOD RIGHT HAND  Result Value Ref Range Status   Specimen Description BLOOD RIGHT HAND  Final   Special Requests   Final    BOTTLES DRAWN AEROBIC ONLY Blood Culture adequate volume   Culture   Final    NO GROWTH 5 DAYS Performed at [facility], [address], [place], [place] [address]    Report Status 07/25/2019 FINAL  Final  SARS Coronavirus 2 [facility] order, Performed in [facility] hospital lab) Nasopharyngeal Nasopharyngeal Swab     Status: None   Collection Time: 07/20/19  5:11 PM   Specimen: Nasopharyngeal Swab  Result Value Ref Range Status   SARS Coronavirus 2 NEGATIVE NEGATIVE Final    Comment: (NOTE) If result is NEGATIVE SARS-CoV-2 target nucleic acids are NOT DETECTED. The SARS-CoV-2 RNA is generally detectable in upper and lower  respiratory specimens during the acute phase of infection. The lowest  concentration of  SARS-CoV-2 viral copies this assay can detect is 250  copies / mL. A negative result does not preclude SARS-CoV-2 infection  and should not be used as the sole basis for treatment or other  patient management decisions.  A negative result may occur with  improper specimen collection / handling, submission of specimen other  than nasopharyngeal swab, presence of viral mutation(s) within the  areas targeted by this assay, and inadequate number of viral copies  (<250 copies / mL). A negative result must be combined with clinical  observations, patient history, and epidemiological information. If result is POSITIVE SARS-CoV-2 target nucleic acids are DETECTED. The SARS-CoV-2 RNA is generally detectable in upper and lower  respiratory specimens dur ing the acute phase of infection.  Positive  results are indicative of active infection with SARS-CoV-2.  Clinical  correlation with patient history and other diagnostic information is  necessary to determine patient infection status.  Positive results do  not rule out bacterial infection or co-infection with other viruses. If result is PRESUMPTIVE POSTIVE SARS-CoV-2 nucleic acids MAY BE PRESENT.   A presumptive positive result was obtained on the submitted specimen  and confirmed on repeat testing.  While 2019 novel coronavirus  (SARS-CoV-2) nucleic acids may be present in the submitted sample  additional confirmatory testing may be necessary for epidemiological  and / or clinical management purposes  to differentiate between  SARS-CoV-2 and other Sarbecovirus currently known to infect humans.  If clinically indicated additional testing with an alternate test  methodology [phone]) is advised. The SARS-CoV-2 RNA is generally  detectable in upper and lower respiratory sp ecimens during the acute  phase of infection. The expected result is Negative. Fact Sheet for Patients:  StrictlyIdeas.no Fact Sheet for Healthcare  Providers: BankingDealers.co.za This test is not yet approved or cleared by the [place] FDA and has been authorized for detection and/or diagnosis of SARS-CoV-2 by FDA under an Emergency Use Authorization (EUA).  This EUA will  remain in effect (meaning this test can be used) for the duration of the COVID-19 declaration under Section 564(b)(1) of the Act, 21 U.S.C. section 360bbb-3(b)(1), unless the authorization is terminated or revoked sooner. Performed at [facility], [address], [place], [place] [address]   Aerobic Culture (superficial specimen)     Status: None (Preliminary result)   Collection Time: 07/23/19  9:31 AM   Specimen: Catheter Tip  Result Value Ref Range Status   Specimen Description   Final    CATH TIP Performed at [facility], [address], [place], [place] [address]    Special Requests   Final    Immunocompromised Performed at [facility], [address] [place], [place] [address]    Gram Stain NO WBC SEEN NO ORGANISMS SEEN   Final   Culture   Final    RARE PSEUDOMONAS AERUGINOSA SUSCEPTIBILITIES TO FOLLOW Performed at [facility], [place] [address] [place], [place] [address]    Report Status PENDING  Incomplete  Surgical PCR screen     Status: None   Collection Time: 07/25/19  5:47 AM   Specimen: Nasal Mucosa; Nasal Swab  Result Value Ref Range Status   MRSA, PCR NEGATIVE NEGATIVE Final   Staphylococcus aureus NEGATIVE NEGATIVE Final    Comment: (NOTE) The Xpert SA Assay (FDA approved for NASAL specimens in patients 41 years of age and older), is one component of a comprehensive surveillance program. It is not intended to diagnose infection nor to guide or monitor treatment. Performed at [facility], [address] [place] [address] [place], [place] [address]      Labs: BNP (last 3 results) No results for input(s): BNP in the last 8760 hours. Basic Metabolic Panel: Recent Labs  Lab 07/19/19 1336  07/21/19 0819  07/22/19 0617 07/23/19 0621 07/24/19 0522 07/25/19 0407  NA 136   < > 138 140 137 140 134*  K 4.3   < > 5.6* 5.1 4.8 4.5 4.7  CL 100   < > 101 100 95* 100 95*  CO2 25   < > 20* 23 20* 26 23  GLUCOSE 145*   < > 144* 104* 171* 129* 128*  BUN 28*   < > 49* 58* 27* 47* 61*  CREATININE 4.44*   < > 7.00* 8.33* 4.97* 7.08* 8.34*  CALCIUM 8.1*   < > 8.7* 8.9 9.0 8.8* 8.4*  MG 1.6*  --   --   --   --   --   --    < > = values in this interval not displayed.   Liver Function Tests: Recent Labs  Lab 07/19/19 1336 07/20/19 1445 07/21/19 0819  AST '31 16 25  ' ALT '20 16 16  ' ALKPHOS 104 116 116  BILITOT 0.6 0.5 0.6  PROT 7.4 8.3* 8.0  ALBUMIN 2.9* 3.1* 3.1*   No results for input(s): LIPASE, AMYLASE in the last 168 hours. No results for input(s): AMMONIA in the last 168 hours. CBC: Recent Labs  Lab 07/19/19 1454 07/20/19 1445 07/21/19 0819 07/22/19 0617 07/23/19 0621 07/24/19 0522 07/25/19 0407  WBC 22.8* 19.1* 17.4* 14.3* 15.3* 11.5* 9.0  NEUTROABS 20.7* 15.4*  --   --   --   --   --   HGB 11.5* 12.2 13.5 12.7 13.5 13.9 11.8*  HCT 37.5 41.4 44.2 42.6 46.6* 47.0* 39.4  MCV 90.1 92.2 88.9 92.2 91.0 92.2 90.2  PLT 213 203 230 233 277 250 250   Cardiac Enzymes: No results for  input(s): CKTOTAL, CKMB, CKMBINDEX, TROPONINI in the last 168 hours. BNP: Invalid input(s): POCBNP CBG: Recent Labs  Lab 07/24/19 0804 07/24/19 1627 07/24/19 2123 07/25/19 0741 07/25/19 1136  GLUCAP 154* 135* 194* 169* 128*   D-Dimer No results for input(s): DDIMER in the last 72 hours. Hgb A1c No results for input(s): HGBA1C in the last 72 hours. Lipid Profile No results for input(s): CHOL, HDL, LDLCALC, TRIG, CHOLHDL, LDLDIRECT in the last 72 hours. Thyroid function studies No results for input(s): TSH, T4TOTAL, T3FREE, THYROIDAB in the last 72 hours.  Invalid input(s): FREET3 Anemia work up No results for input(s): VITAMINB12, FOLATE, FERRITIN, TIBC, IRON, RETICCTPCT in the last 72  hours. Urinalysis    Component Value Date/Time   COLORURINE YELLOW 07/20/2019 2100   APPEARANCEUR CLEAR 07/20/2019 2100   LABSPEC 1.012 07/20/2019 2100   PHURINE 8.0 07/20/2019 2100   GLUCOSEU NEGATIVE 07/20/2019 2100   HGBUR SMALL (A) 07/20/2019 2100   BILIRUBINUR NEGATIVE 07/20/2019 2100   KETONESUR NEGATIVE 07/20/2019 2100   PROTEINUR >=300 (A) 07/20/2019 2100   NITRITE NEGATIVE 07/20/2019 2100   LEUKOCYTESUR NEGATIVE 07/20/2019 2100   Sepsis Labs Invalid input(s): PROCALCITONIN,  WBC,  LACTICIDVEN Microbiology Recent Results (from the past 240 hour(s))  Urine culture     Status: Abnormal   Collection Time: 07/19/19 12:19 PM   Specimen: Urine, Catheterized  Result Value Ref Range Status   Specimen Description   Final    URINE, CATHETERIZED Performed at [facility], [address] [place], [place] [address]    Special Requests   Final    Normal Performed at [facility], [address], [place], [place] [address]    Culture (A)  Final    <10,000 COLONIES/mL INSIGNIFICANT GROWTH Performed at [facility], [place] [address] [place], [place] [address]    Report Status 07/20/2019 FINAL  Final  Culture, blood (Routine x 2)     Status: Abnormal   Collection Time: 07/19/19  1:36 PM   Specimen: BLOOD RIGHT ARM  Result Value Ref Range Status   Specimen Description   Final    BLOOD RIGHT ARM Performed at [facility], [address], [place], [place] [address]    Special Requests   Final    BOTTLES DRAWN AEROBIC ONLY Blood Culture adequate volume Performed at [facility], [address], [place], [place] [address]    Culture  Setup Time   Final    GRAM NEGATIVE RODS AEROBIC BOTTLE ONLY Gram Stain Report Called to,Read Back By and Verified With: HARTLEY,A'@[facility]'  @ 1100 BY MATTHEWS, B 8.27.2020 [facility] Organism ID to follow CRITICAL RESULT CALLED TO, READ BACK BY AND VERIFIED WITH: Marissa Nestle RN 07/20/19 1905 Performed at [facility], [place] [address],  [place], [place] [address]    Culture PSEUDOMONAS AERUGINOSA (A)  Final   Report Status 07/22/2019 FINAL  Final   Organism ID, Bacteria PSEUDOMONAS AERUGINOSA  Final      Susceptibility   Pseudomonas aeruginosa - MIC*    CEFTAZIDIME 2 SENSITIVE Sensitive     CIPROFLOXACIN <=0.25 SENSITIVE Sensitive     GENTAMICIN <=1 SENSITIVE Sensitive     IMIPENEM 2 SENSITIVE Sensitive     PIP/TAZO <=4 SENSITIVE Sensitive     CEFEPIME <=1 SENSITIVE Sensitive     * PSEUDOMONAS AERUGINOSA  Culture, blood (Routine x 2)     Status: Abnormal   Collection Time: 07/19/19  1:36 PM   Specimen: BLOOD RIGHT HAND  Result Value Ref  Range Status   Specimen Description   Final    BLOOD RIGHT HAND Performed at [facility], [address], [place], [place] [address]    Special Requests   Final    AEROBIC BOTTLE ONLY Blood Culture adequate volume Performed at [facility], [address], [place], [place] [address]    Culture  Setup Time   Final    AEROBIC BOTTLE ONLY GRAM NEGATIVE RODS CRITICAL VALUE NOTED.  VALUE IS CONSISTENT WITH PREVIOUSLY REPORTED AND CALLED VALUE.    Culture (A)  Final    PSEUDOMONAS AERUGINOSA SUSCEPTIBILITIES PERFORMED ON PREVIOUS CULTURE WITHIN THE LAST 5 DAYS. Performed at [facility], [place] [address], [place], [place] [address]    Report Status 07/22/2019 FINAL  Final  Blood Culture ID Panel (Reflexed)     Status: Abnormal   Collection Time: 07/19/19  1:36 PM  Result Value Ref Range Status   Enterococcus species NOT DETECTED NOT DETECTED Final   Listeria monocytogenes NOT DETECTED NOT DETECTED Final   Staphylococcus species NOT DETECTED NOT DETECTED Final   Staphylococcus aureus (BCID) NOT DETECTED NOT DETECTED Final   Streptococcus species NOT DETECTED NOT DETECTED Final   Streptococcus agalactiae NOT DETECTED NOT DETECTED Final   Streptococcus pneumoniae NOT DETECTED NOT DETECTED Final   Streptococcus pyogenes NOT DETECTED NOT DETECTED Final   Acinetobacter baumannii NOT  DETECTED NOT DETECTED Final   Enterobacteriaceae species NOT DETECTED NOT DETECTED Final   Enterobacter cloacae complex NOT DETECTED NOT DETECTED Final   Escherichia coli NOT DETECTED NOT DETECTED Final   Klebsiella oxytoca NOT DETECTED NOT DETECTED Final   Klebsiella pneumoniae NOT DETECTED NOT DETECTED Final   Proteus species NOT DETECTED NOT DETECTED Final   Serratia marcescens NOT DETECTED NOT DETECTED Final   Carbapenem resistance NOT DETECTED NOT DETECTED Final   Haemophilus influenzae NOT DETECTED NOT DETECTED Final   Neisseria meningitidis NOT DETECTED NOT DETECTED Final   Pseudomonas aeruginosa DETECTED (A) NOT DETECTED Final    Comment: CRITICAL RESULT CALLED TO, READ BACK BY AND VERIFIED WITH: Marissa Nestle RN 07/20/19 1905 JDW    Candida albicans NOT DETECTED NOT DETECTED Final   Candida glabrata NOT DETECTED NOT DETECTED Final   Candida krusei NOT DETECTED NOT DETECTED Final   Candida parapsilosis NOT DETECTED NOT DETECTED Final   Candida tropicalis NOT DETECTED NOT DETECTED Final    Comment: Performed at [facility], [address] [place], [place] [address]  Culture, blood (Routine x 2)     Status: None   Collection Time: 07/20/19  2:45 PM   Specimen: BLOOD LEFT HAND  Result Value Ref Range Status   Specimen Description BLOOD LEFT HAND  Final   Special Requests   Final    BOTTLES DRAWN AEROBIC ONLY Blood Culture adequate volume   Culture   Final    NO GROWTH 5 DAYS Performed at [facility], [address], [place], [place] [address]    Report Status 07/25/2019 FINAL  Final  Culture, blood (Routine x 2)     Status: None   Collection Time: 07/20/19  2:45 PM   Specimen: BLOOD RIGHT HAND  Result Value Ref Range Status   Specimen Description BLOOD RIGHT HAND  Final   Special Requests   Final    BOTTLES DRAWN AEROBIC ONLY Blood Culture adequate volume   Culture   Final    NO GROWTH 5 DAYS Performed at [facility], [address] [place], [place] [address]  Report Status 07/25/2019 FINAL  Final  SARS Coronavirus 2 [facility] order, Performed in [facility] hospital lab) Nasopharyngeal Nasopharyngeal Swab     Status: None   Collection Time: 07/20/19  5:11 PM   Specimen: Nasopharyngeal Swab  Result Value Ref Range Status   SARS Coronavirus 2 NEGATIVE NEGATIVE Final    Comment: (NOTE) If result is NEGATIVE SARS-CoV-2 target nucleic acids are NOT DETECTED. The SARS-CoV-2 RNA is generally detectable in upper and lower  respiratory specimens during the acute phase of infection. The lowest  concentration of SARS-CoV-2 viral copies this assay can detect is 250  copies / mL. A negative result does not preclude SARS-CoV-2 infection  and should not be used as the sole basis for treatment or other  patient management decisions.  A negative result may occur with  improper specimen collection / handling, submission of specimen other  than nasopharyngeal swab, presence of viral mutation(s) within the  areas targeted by this assay, and inadequate number of viral copies  (<250 copies / mL). A negative result must be combined with clinical  observations, patient history, and epidemiological information. If result is POSITIVE SARS-CoV-2 target nucleic acids are DETECTED. The SARS-CoV-2 RNA is generally detectable in upper and lower  respiratory specimens dur ing the acute phase of infection.  Positive  results are indicative of active infection with SARS-CoV-2.  Clinical  correlation with patient history and other diagnostic information is  necessary to determine patient infection status.  Positive results do  not rule out bacterial infection or co-infection with other viruses. If result is PRESUMPTIVE POSTIVE SARS-CoV-2 nucleic acids MAY BE PRESENT.   A presumptive positive result was obtained on the submitted specimen  and confirmed on repeat testing.  While 2019 novel coronavirus  (SARS-CoV-2) nucleic acids may be present in the submitted sample   additional confirmatory testing may be necessary for epidemiological  and / or clinical management purposes  to differentiate between  SARS-CoV-2 and other Sarbecovirus currently known to infect humans.  If clinically indicated additional testing with an alternate test  methodology [phone]) is advised. The SARS-CoV-2 RNA is generally  detectable in upper and lower respiratory sp ecimens during the acute  phase of infection. The expected result is Negative. Fact Sheet for Patients:  StrictlyIdeas.no Fact Sheet for Healthcare Providers: BankingDealers.co.za This test is not yet approved or cleared by the [place] FDA and has been authorized for detection and/or diagnosis of SARS-CoV-2 by FDA under an Emergency Use Authorization (EUA).  This EUA will remain in effect (meaning this test can be used) for the duration of the COVID-19 declaration under Section 564(b)(1) of the Act, 21 U.S.C. section 360bbb-3(b)(1), unless the authorization is terminated or revoked sooner. Performed at [facility], [address], [place], [place] [address]   Aerobic Culture (superficial specimen)     Status: None (Preliminary result)   Collection Time: 07/23/19  9:31 AM   Specimen: Catheter Tip  Result Value Ref Range Status   Specimen Description   Final    CATH TIP Performed at [facility], [address], [place], [place] [address]    Special Requests   Final    Immunocompromised Performed at [facility], [address], [place], [place] [address]    Gram Stain NO WBC SEEN NO ORGANISMS SEEN   Final   Culture   Final    RARE PSEUDOMONAS AERUGINOSA SUSCEPTIBILITIES TO FOLLOW Performed at [facility], [place] [address], [place], [place] [address]  Report Status PENDING  Incomplete  Surgical PCR screen     Status: None   Collection Time: 07/25/19  5:47 AM   Specimen: Nasal Mucosa; Nasal Swab  Result Value Ref Range Status   MRSA, PCR  NEGATIVE NEGATIVE Final   Staphylococcus aureus NEGATIVE NEGATIVE Final    Comment: (NOTE) The Xpert SA Assay (FDA approved for NASAL specimens in patients 23 years of age and older), is one component of a comprehensive surveillance program. It is not intended to diagnose infection nor to guide or monitor treatment. Performed at [facility], [address] [place], [place] [address]      Time coordinating discharge: 40 minutes  SIGNED:   Rodena Goldmann, DO [facility] 07/25/2019, 2:53 PM  If 7PM-7AM, please contact night-coverage [email or web address] Password [email or web address]

## 2019-07-26 ENCOUNTER — Encounter (HOSPITAL_COMMUNITY): Payer: Self-pay | Admitting: General Surgery

## 2019-07-26 DIAGNOSIS — E1121 Type 2 diabetes mellitus with diabetic nephropathy: Secondary | ICD-10-CM

## 2019-07-26 DIAGNOSIS — Z992 Dependence on renal dialysis: Secondary | ICD-10-CM

## 2019-07-26 DIAGNOSIS — R7881 Bacteremia: Secondary | ICD-10-CM

## 2019-07-26 DIAGNOSIS — N186 End stage renal disease: Secondary | ICD-10-CM

## 2019-07-26 DIAGNOSIS — Z794 Long term (current) use of insulin: Secondary | ICD-10-CM

## 2019-07-26 DIAGNOSIS — I10 Essential (primary) hypertension: Secondary | ICD-10-CM

## 2019-07-26 LAB — AEROBIC CULTURE W GRAM STAIN (SUPERFICIAL SPECIMEN): Gram Stain: NONE SEEN

## 2019-07-26 LAB — GLUCOSE, CAPILLARY
Glucose-Capillary: 153 mg/dL — ABNORMAL HIGH (ref 70–99)
Glucose-Capillary: 157 mg/dL — ABNORMAL HIGH (ref 70–99)

## 2019-07-26 LAB — HEPATITIS B SURFACE ANTIGEN: Hepatitis B Surface Ag: NEGATIVE

## 2019-07-26 NOTE — Progress Notes (Signed)
Patient ID: [name], female   DOB: 08-16-1951, 68 y.o.   MRN: [id number] S: Feels better today, no back pain.  Received HD late last night O:BP 131/80 (BP Location: Left Arm)   Pulse 84   Temp 98.8 F (37.1 C) (Oral)   Resp 16   Ht 5\' 7"  (1.702 m)   Wt 65 kg   SpO2 100%   BMI 22.44 kg/m   Intake/Output Summary (Last 24 hours) at 07/26/2019 1045 Last data filed at 07/26/2019 [id number] Gross per 24 hour  Intake 340 ml  Output 1002 ml  Net -662 ml   Intake/Output: I/O last 3 completed shifts: In: 340 [P.O.:240; I.V.:100] Out: 1002 [Other:1000; Blood:2]  Intake/Output this shift:  No intake/output data recorded. Weight change: 0 kg Gen: NAD CVS: no ru b Resp: cta Abd:+BS, soft, NT/ND Ext: no edema  Recent Labs  Lab 07/19/19 1336 07/20/19 1445 07/21/19 0819 07/22/19 0617 07/23/19 0621 07/24/19 0522 07/25/19 0407  NA 136 137 138 140 137 140 134*  K 4.3 4.9 5.6* 5.1 4.8 4.5 4.7  CL 100 98 101 100 95* 100 95*  CO2 25 28 20* 23 20* 26 23  GLUCOSE 145* 152* 144* 104* 171* 129* 128*  BUN 28* 42* 49* 58* 27* 47* 61*  CREATININE 4.44* 6.39* 7.00* 8.33* 4.97* 7.08* 8.34*  ALBUMIN 2.9* 3.1* 3.1*  --   --   --   --   CALCIUM 8.1* 8.5* 8.7* 8.9 9.0 8.8* 8.4*  AST 31 16 25   --   --   --   --   ALT 20 16 16   --   --   --   --    Liver Function Tests: Recent Labs  Lab 07/19/19 1336 07/20/19 1445 07/21/19 0819  AST 31 16 25   ALT 20 16 16   ALKPHOS 104 116 116  BILITOT 0.6 0.5 0.6  PROT 7.4 8.3* 8.0  ALBUMIN 2.9* 3.1* 3.1*   No results for input(s): LIPASE, AMYLASE in the last 168 hours. No results for input(s): AMMONIA in the last 168 hours. CBC: Recent Labs  Lab 07/19/19 1454 07/20/19 1445 07/21/19 0819 07/22/19 0617 07/23/19 0621 07/24/19 0522 07/25/19 0407  WBC 22.8* 19.1* 17.4* 14.3* 15.3* 11.5* 9.0  NEUTROABS 20.7* 15.4*  --   --   --   --   --   HGB 11.5* 12.2 13.5 12.7 13.5 13.9 11.8*  HCT 37.5 41.4 44.2 42.6 46.6* 47.0* 39.4  MCV 90.1 92.2 88.9 92.2  91.0 92.2 90.2  PLT 213 203 230 233 277 250 250   Cardiac Enzymes: No results for input(s): CKTOTAL, CKMB, CKMBINDEX, TROPONINI in the last 168 hours. CBG: Recent Labs  Lab 07/25/19 0741 07/25/19 1136 07/25/19 1625 07/25/19 2114 07/26/19 0732  GLUCAP 169* 128* 117* 149* 153*    Iron Studies: No results for input(s): IRON, TIBC, TRANSFERRIN, FERRITIN in the last 72 hours. Studies/Results: Dg Chest Port 1 View  Result Date: 07/25/2019 CLINICAL DATA:  Hemodialysis catheter insertion EXAM: PORTABLE CHEST 1 VIEW COMPARISON:  07/20/2019 FINDINGS: Right jugular central venous catheter tip at the cavoatrial junction unchanged in position. No pneumothorax. Mild left lower lobe atelectasis has developed in the interval. No edema or effusion. Right lung clear IMPRESSION: Central venous catheter tip at the cavoatrial junction unchanged. Interval development of mild left lower lobe atelectasis.  No edema. Electronically Signed   By: Franchot Gallo M.D.   On: 07/25/2019 14:14   Dg C-arm 1-60 Min-no Report  Result Date: 07/25/2019  Fluoroscopy was utilized by the requesting physician.  No radiographic interpretation.   Marland Kitchen amLODipine  10 mg Oral QPC lunch  . aspirin EC  81 mg Oral QPC lunch  . calcium carbonate  1 tablet Oral BID  . carvedilol  6.25 mg Oral BID  . Chlorhexidine Gluconate Cloth  6 each Topical Q0600  . clopidogrel  75 mg Oral Daily  . feeding supplement (NEPRO CARB STEADY)  237 mL Oral BID BM  . insulin aspart  0-5 Units Subcutaneous QHS  . insulin aspart  0-9 Units Subcutaneous TID WC  . isosorbide mononitrate  30 mg Oral QPC lunch  . multivitamin  1 tablet Oral QPC lunch  . mupirocin ointment  1 application Nasal BID  . sodium chloride flush  3 mL Intravenous Q12H    BMET    Component Value Date/Time   NA 134 (L) 07/25/2019 0407   K 4.7 07/25/2019 0407   CL 95 (L) 07/25/2019 0407   CO2 23 07/25/2019 0407   GLUCOSE 128 (H) 07/25/2019 0407   BUN 61 (H) 07/25/2019 0407    CREATININE 8.34 (H) 07/25/2019 0407   CALCIUM 8.4 (L) 07/25/2019 0407   GFRNONAA 4 (L) 07/25/2019 0407   GFRAA 5 (L) 07/25/2019 0407   CBC    Component Value Date/Time   WBC 9.0 07/25/2019 0407   RBC 4.37 07/25/2019 0407   HGB 11.8 (L) 07/25/2019 0407   HCT 39.4 07/25/2019 0407   PLT 250 07/25/2019 0407   MCV 90.2 07/25/2019 0407   MCH 27.0 07/25/2019 0407   MCHC 29.9 (L) 07/25/2019 0407   RDW 15.6 (H) 07/25/2019 0407   LYMPHSABS 2.2 07/20/2019 1445   MONOABS 1.3 (H) 07/20/2019 1445   EOSABS 0.1 07/20/2019 1445   BASOSABS 0.0 07/20/2019 1445     Assessment/Plan:  1. Pseudomonas bacteremia- On cefepime and TDC removed8/30/20. Tip culture negative. Per ID she is to receive total of 6 weeks of IV antibiotics due to concern for discitis/osteo of L3/L4. At time of discharge will need to change abx to [facility] 2 grams with HD as an outpatient to complete the 6 week course.  WBC improving, afebrile, and back pain improved. 2. Vascular access- TDC removed 07/23/19. Appreciate Dr. Constance Haw assistance with [facility] placement 07/25/19. 3. ESRD- normally MWF, off schedule due to hospitalization.  Had HD 07/22/19 and again on 07/25/19. Will plan for discharge today and can follow up at her outpatient center for her regularly scheduled HD on Friday.  I spoke with Ramiro Harvest, PA-C regarding 6 weeks of fortaz and he will arrange for outpatient abx.   4. Possible discitis of L3/L4- on abx. For repeat MRI in 3-4 weeks to document stability and agree with prolonged course of IV Abx.  5. Acute metabolic encephalopathy- improving.  Neuro following and await recommendations. 6. Disposition- stable for discharge today and f/u with outpatient HD on Friday.   Donetta Potts, MD [employer] [phone]

## 2019-07-26 NOTE — Progress Notes (Signed)
07/26/2019 10:33 AM  Pt examined and feels much better and stable for discharge.    Murvin Natal MD

## 2019-07-26 NOTE — Anesthesia Postprocedure Evaluation (Signed)
Anesthesia Post Note  Patient: Personnel officer  Procedure(s) Performed: INSERTION OF DIALYSIS CATHETER RIGHT INTERNAL JUGULAR (TUNNELED) (Right )  Patient location during evaluation: PACU Anesthesia Type: MAC Level of consciousness: oriented and awake and alert Pain management: pain level controlled Vital Signs Assessment: post-procedure vital signs reviewed and stable Respiratory status: spontaneous breathing Cardiovascular status: stable Postop Assessment: no apparent nausea or vomiting Anesthetic complications: no     Last Vitals:  Vitals:   07/25/19 2112 07/26/19 0447  BP: 115/76 131/80  Pulse: 99 84  Resp: 16 16  Temp: 37.1 C 37.1 C  SpO2: 100% 100%    Last Pain:  Vitals:   07/26/19 0447  TempSrc: Oral  PainSc:                  [name]

## 2019-07-26 NOTE — Care Management Important Message (Signed)
Important Message  Patient Details  Name: [name] MRN: [id number] Date of Birth: 01-Jul-1951   Medicare Important Message Given:  Yes     Tommy Medal 07/26/2019, 11:25 AM

## 2019-07-26 NOTE — Progress Notes (Signed)
IV removed, d/c instructions reviewed with patient and son. Verbalized understating. Patient transported to private vehicle via wheelchair.

## 2019-07-26 NOTE — Discharge Instructions (Signed)
Bacteremia, Adult Bacteremia is the presence of bacteria in the blood. When bacteria enter the bloodstream, they can cause a life-threatening reaction called sepsis, which is a medical emergency. Bacteremia can spread to other parts of the body, including the heart, joints, and brain. What are the causes? This condition is caused by bacteria that get into the blood.  Bacteria can enter the blood: ? From a skin infection or injury, such as a burn or a cut. ? From a lung infection (pneumonia). ? From an infection in your stomach or intestines (gastrointestinal infection). ? From an infection in your bladder or urinary system (urinary tract infection). ? During a dental or medical procedure. ? From bleeding gums. ? When a bacterial infection in another part of your body spreads to your blood. ? Through an unclean (contaminated) needle. What increases the risk? This condition is more likely to develop in children, the elderly, and people who:  Have a long-term (chronic) disease or condition like diabetes or chronic kidney failure.  Have an artificial joint or heart valve.  Have heart valve disease.  Have a tube inserted to treat a medical condition, such as a urinary catheter or IV.  Have a weak disease-fighting system (immune system).  Inject illegal drugs.  Have been hospitalized for more than 10 days in a row. What are the signs or symptoms? Symptoms of this condition include:  Fever.  Chills.  Fast heartbeat.  Shortness of breath.  Dizziness.  Weakness.  Confusion.  Nausea or vomiting.  Diarrhea.  Low blood pressure.  Decreased urine output. Bacteremia that has spread to other parts of the body may cause symptoms in those areas. In some cases, there are no symptoms. How is this diagnosed? This condition may be diagnosed with a physical exam and tests, such as:  A complete blood count (CBC). This test checks for signs of infection.  Blood cultures. These  check for bacteria in your blood.  Tests of any tubes that you have had inserted. These tests check for a source of infection.  Urine tests, including urine cultures. These check for bacteria in the urine that could be a source of infection.  Imaging tests, such as an X-ray, CT scan, MRI, or heart ultrasound. These check for a source of infection in other parts of your body, such as your lungs, heart valves, or joints. How is this treated? This condition is usually treated in the hospital. Treatment may involve:  Antibiotic medicines. These may be given by mouth (orally) or directly into your blood through an IV (infusion through your vein). ? Depending on the source of infection, you may need antibiotics for several weeks. ? At first, you may be given an antibiotic to kill most types of blood bacteria (broad-spectrum antibiotic). If your test results show that a certain kind of bacteria is causing the problem, you may be given a different antibiotic to kill that specific bacteria.  IV fluids.  Removing any catheter or device that could be a source of infection.  Blood pressure and breathing support, if needed.  Surgery to control the source or the spread of infection, such as surgery to remove an infected device, abscess, or tissue.  Having follow-up visits for medicines, blood tests, and further evaluation. Follow these instructions at home: Medicines  Take over-the-counter and prescription medicines only as told by your health care provider.  If you were prescribed an antibiotic medicine, take it as told by your health care provider. Do not stop taking the   antibiotic even if you start to feel better. General instructions   Rest as needed. Ask your health care provider when you may return to normal activities.  Drink enough fluid to keep your urine pale yellow.  Do not use any products that contain nicotine or tobacco, such as cigarettes and e-cigarettes. If you need help  quitting, ask your health care provider.  Keep all follow-up visits as told by your health care provider. This is important. How is this prevented?   Wash your hands regularly with soap and water. If soap and water are not available, use hand sanitizer.  You should wash your hands: ? After using the toilet or changing a diaper. ? Before preparing, cooking, or serving food. ? While caring for a sick person or while visiting someone in a hospital. ? Before and after changing bandages (dressings) over wounds.  Clean any scrapes or cuts with soap and water and cover them with clean dressings.  Get vaccinations as recommended by your health care provider.  Practice good oral hygiene. Brush your teeth two times a day, and floss regularly.  Take good care of your skin. This includes bathing and moisturizing on a regular basis. Get help right away if you have:  Pain.  A fever or chills.  Trouble breathing.  A fast heart rate.  Skin that is blotchy, pale, or clammy.  Confusion.  Weakness.  Lack of energy (lethargy) or unusual sleepiness.  Diarrhea.  New symptoms that develop after treatment has started. These symptoms may represent a serious problem that is an emergency. Do not wait to see if the symptoms will go away. Get medical help right away. Call your local emergency services (911 in the [place] Do not drive yourself to the hospital. Summary  Bacteremia is the presence of bacteria in the blood. When bacteria enter the bloodstream, they can cause a life-threatening reaction called sepsis.  Some symptoms of bacteremia include fever, chills, shortness of breath, confusion, nausea or vomiting, and diarrhea.  Tests may be done to find the source of infection that led to bacteremia. These tests may include blood tests, urine tests, and imaging tests.  Bacteremia is usually treated with antibiotic medicines in the hospital.  Get help right away if you have any new symptoms  that develop after treatment has started. This information is not intended to replace advice given to you by your health care provider. Make sure you discuss any questions you have with your health care provider. Document Released: 08/23/2006 Document Revised: 03/21/2018 Document Reviewed: 03/21/2018 Elsevier Patient Education  2020 [employer]  

## 2019-07-26 NOTE — Clinical Social Work Note (Signed)
Notified Christy at [facility] that patient did not discharge yesterday due to finishing her dialysis after 8 p.m. Advised that patient will discharge today. Dialysis Center already has patient's antibiotic. Advised that they will do her next treatment on Friday.      Mattison Golay, Clydene Pugh, LCSW

## 2019-07-27 DIAGNOSIS — Z992 Dependence on renal dialysis: Secondary | ICD-10-CM | POA: Diagnosis not present

## 2019-07-27 DIAGNOSIS — N186 End stage renal disease: Secondary | ICD-10-CM | POA: Diagnosis not present

## 2019-07-27 DIAGNOSIS — M48061 Spinal stenosis, lumbar region without neurogenic claudication: Secondary | ICD-10-CM | POA: Diagnosis not present

## 2019-07-27 DIAGNOSIS — Z8673 Personal history of transient ischemic attack (TIA), and cerebral infarction without residual deficits: Secondary | ICD-10-CM | POA: Diagnosis not present

## 2019-07-27 DIAGNOSIS — E1122 Type 2 diabetes mellitus with diabetic chronic kidney disease: Secondary | ICD-10-CM | POA: Diagnosis not present

## 2019-07-27 DIAGNOSIS — I10 Essential (primary) hypertension: Secondary | ICD-10-CM | POA: Diagnosis not present

## 2019-07-27 DIAGNOSIS — R5381 Other malaise: Secondary | ICD-10-CM | POA: Diagnosis not present

## 2019-07-28 DIAGNOSIS — Z23 Encounter for immunization: Secondary | ICD-10-CM | POA: Diagnosis not present

## 2019-07-28 DIAGNOSIS — R7881 Bacteremia: Secondary | ICD-10-CM | POA: Diagnosis not present

## 2019-07-28 DIAGNOSIS — B9689 Other specified bacterial agents as the cause of diseases classified elsewhere: Secondary | ICD-10-CM | POA: Diagnosis not present

## 2019-07-28 DIAGNOSIS — Z992 Dependence on renal dialysis: Secondary | ICD-10-CM | POA: Diagnosis not present

## 2019-07-28 DIAGNOSIS — N186 End stage renal disease: Secondary | ICD-10-CM | POA: Diagnosis not present

## 2019-07-31 DIAGNOSIS — Z23 Encounter for immunization: Secondary | ICD-10-CM | POA: Diagnosis not present

## 2019-07-31 DIAGNOSIS — Z992 Dependence on renal dialysis: Secondary | ICD-10-CM | POA: Diagnosis not present

## 2019-07-31 DIAGNOSIS — B9689 Other specified bacterial agents as the cause of diseases classified elsewhere: Secondary | ICD-10-CM | POA: Diagnosis not present

## 2019-07-31 DIAGNOSIS — R7881 Bacteremia: Secondary | ICD-10-CM | POA: Diagnosis not present

## 2019-07-31 DIAGNOSIS — N186 End stage renal disease: Secondary | ICD-10-CM | POA: Diagnosis not present

## 2019-08-02 DIAGNOSIS — R7881 Bacteremia: Secondary | ICD-10-CM | POA: Diagnosis not present

## 2019-08-02 DIAGNOSIS — Z992 Dependence on renal dialysis: Secondary | ICD-10-CM | POA: Diagnosis not present

## 2019-08-02 DIAGNOSIS — B9689 Other specified bacterial agents as the cause of diseases classified elsewhere: Secondary | ICD-10-CM | POA: Diagnosis not present

## 2019-08-02 DIAGNOSIS — Z23 Encounter for immunization: Secondary | ICD-10-CM | POA: Diagnosis not present

## 2019-08-02 DIAGNOSIS — N186 End stage renal disease: Secondary | ICD-10-CM | POA: Diagnosis not present

## 2019-08-03 DIAGNOSIS — Z8673 Personal history of transient ischemic attack (TIA), and cerebral infarction without residual deficits: Secondary | ICD-10-CM | POA: Diagnosis not present

## 2019-08-03 DIAGNOSIS — M48061 Spinal stenosis, lumbar region without neurogenic claudication: Secondary | ICD-10-CM | POA: Diagnosis not present

## 2019-08-03 DIAGNOSIS — I10 Essential (primary) hypertension: Secondary | ICD-10-CM | POA: Diagnosis not present

## 2019-08-03 DIAGNOSIS — Z992 Dependence on renal dialysis: Secondary | ICD-10-CM | POA: Diagnosis not present

## 2019-08-03 DIAGNOSIS — N186 End stage renal disease: Secondary | ICD-10-CM | POA: Diagnosis not present

## 2019-08-03 DIAGNOSIS — E1122 Type 2 diabetes mellitus with diabetic chronic kidney disease: Secondary | ICD-10-CM | POA: Diagnosis not present

## 2019-08-04 DIAGNOSIS — R7881 Bacteremia: Secondary | ICD-10-CM | POA: Diagnosis not present

## 2019-08-04 DIAGNOSIS — Z992 Dependence on renal dialysis: Secondary | ICD-10-CM | POA: Diagnosis not present

## 2019-08-04 DIAGNOSIS — N186 End stage renal disease: Secondary | ICD-10-CM | POA: Diagnosis not present

## 2019-08-04 DIAGNOSIS — B9689 Other specified bacterial agents as the cause of diseases classified elsewhere: Secondary | ICD-10-CM | POA: Diagnosis not present

## 2019-08-04 DIAGNOSIS — Z23 Encounter for immunization: Secondary | ICD-10-CM | POA: Diagnosis not present

## 2019-08-07 DIAGNOSIS — B9689 Other specified bacterial agents as the cause of diseases classified elsewhere: Secondary | ICD-10-CM | POA: Diagnosis not present

## 2019-08-07 DIAGNOSIS — N184 Chronic kidney disease, stage 4 (severe): Secondary | ICD-10-CM | POA: Diagnosis not present

## 2019-08-07 DIAGNOSIS — R7881 Bacteremia: Secondary | ICD-10-CM | POA: Diagnosis not present

## 2019-08-07 DIAGNOSIS — Z992 Dependence on renal dialysis: Secondary | ICD-10-CM | POA: Diagnosis not present

## 2019-08-07 DIAGNOSIS — E7800 Pure hypercholesterolemia, unspecified: Secondary | ICD-10-CM | POA: Diagnosis not present

## 2019-08-07 DIAGNOSIS — Z23 Encounter for immunization: Secondary | ICD-10-CM | POA: Diagnosis not present

## 2019-08-07 DIAGNOSIS — E1122 Type 2 diabetes mellitus with diabetic chronic kidney disease: Secondary | ICD-10-CM | POA: Diagnosis not present

## 2019-08-07 DIAGNOSIS — K210 Gastro-esophageal reflux disease with esophagitis: Secondary | ICD-10-CM | POA: Diagnosis not present

## 2019-08-07 DIAGNOSIS — D638 Anemia in other chronic diseases classified elsewhere: Secondary | ICD-10-CM | POA: Diagnosis not present

## 2019-08-07 DIAGNOSIS — N186 End stage renal disease: Secondary | ICD-10-CM | POA: Diagnosis not present

## 2019-08-07 DIAGNOSIS — I10 Essential (primary) hypertension: Secondary | ICD-10-CM | POA: Diagnosis not present

## 2019-08-08 ENCOUNTER — Other Ambulatory Visit: Payer: Self-pay | Admitting: Family Medicine

## 2019-08-08 ENCOUNTER — Other Ambulatory Visit (HOSPITAL_COMMUNITY): Payer: Self-pay | Admitting: Family Medicine

## 2019-08-08 DIAGNOSIS — M545 Low back pain, unspecified: Secondary | ICD-10-CM

## 2019-08-09 DIAGNOSIS — N186 End stage renal disease: Secondary | ICD-10-CM | POA: Diagnosis not present

## 2019-08-09 DIAGNOSIS — Z23 Encounter for immunization: Secondary | ICD-10-CM | POA: Diagnosis not present

## 2019-08-09 DIAGNOSIS — R7881 Bacteremia: Secondary | ICD-10-CM | POA: Diagnosis not present

## 2019-08-09 DIAGNOSIS — B9689 Other specified bacterial agents as the cause of diseases classified elsewhere: Secondary | ICD-10-CM | POA: Diagnosis not present

## 2019-08-09 DIAGNOSIS — Z992 Dependence on renal dialysis: Secondary | ICD-10-CM | POA: Diagnosis not present

## 2019-08-10 DIAGNOSIS — Z8673 Personal history of transient ischemic attack (TIA), and cerebral infarction without residual deficits: Secondary | ICD-10-CM | POA: Diagnosis not present

## 2019-08-10 DIAGNOSIS — M48061 Spinal stenosis, lumbar region without neurogenic claudication: Secondary | ICD-10-CM | POA: Diagnosis not present

## 2019-08-10 DIAGNOSIS — E1122 Type 2 diabetes mellitus with diabetic chronic kidney disease: Secondary | ICD-10-CM | POA: Diagnosis not present

## 2019-08-10 DIAGNOSIS — N186 End stage renal disease: Secondary | ICD-10-CM | POA: Diagnosis not present

## 2019-08-10 DIAGNOSIS — Z992 Dependence on renal dialysis: Secondary | ICD-10-CM | POA: Diagnosis not present

## 2019-08-10 DIAGNOSIS — I10 Essential (primary) hypertension: Secondary | ICD-10-CM | POA: Diagnosis not present

## 2019-08-11 DIAGNOSIS — Z23 Encounter for immunization: Secondary | ICD-10-CM | POA: Diagnosis not present

## 2019-08-11 DIAGNOSIS — B9689 Other specified bacterial agents as the cause of diseases classified elsewhere: Secondary | ICD-10-CM | POA: Diagnosis not present

## 2019-08-11 DIAGNOSIS — R7881 Bacteremia: Secondary | ICD-10-CM | POA: Diagnosis not present

## 2019-08-11 DIAGNOSIS — N186 End stage renal disease: Secondary | ICD-10-CM | POA: Diagnosis not present

## 2019-08-11 DIAGNOSIS — Z992 Dependence on renal dialysis: Secondary | ICD-10-CM | POA: Diagnosis not present

## 2019-08-12 DIAGNOSIS — M48061 Spinal stenosis, lumbar region without neurogenic claudication: Secondary | ICD-10-CM | POA: Diagnosis not present

## 2019-08-12 DIAGNOSIS — E1122 Type 2 diabetes mellitus with diabetic chronic kidney disease: Secondary | ICD-10-CM | POA: Diagnosis not present

## 2019-08-12 DIAGNOSIS — I10 Essential (primary) hypertension: Secondary | ICD-10-CM | POA: Diagnosis not present

## 2019-08-12 DIAGNOSIS — N186 End stage renal disease: Secondary | ICD-10-CM | POA: Diagnosis not present

## 2019-08-12 DIAGNOSIS — Z992 Dependence on renal dialysis: Secondary | ICD-10-CM | POA: Diagnosis not present

## 2019-08-12 DIAGNOSIS — Z8673 Personal history of transient ischemic attack (TIA), and cerebral infarction without residual deficits: Secondary | ICD-10-CM | POA: Diagnosis not present

## 2019-08-14 DIAGNOSIS — N186 End stage renal disease: Secondary | ICD-10-CM | POA: Diagnosis not present

## 2019-08-14 DIAGNOSIS — B9689 Other specified bacterial agents as the cause of diseases classified elsewhere: Secondary | ICD-10-CM | POA: Diagnosis not present

## 2019-08-14 DIAGNOSIS — Z23 Encounter for immunization: Secondary | ICD-10-CM | POA: Diagnosis not present

## 2019-08-14 DIAGNOSIS — R7881 Bacteremia: Secondary | ICD-10-CM | POA: Diagnosis not present

## 2019-08-14 DIAGNOSIS — Z992 Dependence on renal dialysis: Secondary | ICD-10-CM | POA: Diagnosis not present

## 2019-08-16 DIAGNOSIS — Z992 Dependence on renal dialysis: Secondary | ICD-10-CM | POA: Diagnosis not present

## 2019-08-16 DIAGNOSIS — B9689 Other specified bacterial agents as the cause of diseases classified elsewhere: Secondary | ICD-10-CM | POA: Diagnosis not present

## 2019-08-16 DIAGNOSIS — N186 End stage renal disease: Secondary | ICD-10-CM | POA: Diagnosis not present

## 2019-08-16 DIAGNOSIS — Z23 Encounter for immunization: Secondary | ICD-10-CM | POA: Diagnosis not present

## 2019-08-16 DIAGNOSIS — R7881 Bacteremia: Secondary | ICD-10-CM | POA: Diagnosis not present

## 2019-08-17 DIAGNOSIS — M48061 Spinal stenosis, lumbar region without neurogenic claudication: Secondary | ICD-10-CM | POA: Diagnosis not present

## 2019-08-17 DIAGNOSIS — Z8673 Personal history of transient ischemic attack (TIA), and cerebral infarction without residual deficits: Secondary | ICD-10-CM | POA: Diagnosis not present

## 2019-08-17 DIAGNOSIS — N186 End stage renal disease: Secondary | ICD-10-CM | POA: Diagnosis not present

## 2019-08-17 DIAGNOSIS — I10 Essential (primary) hypertension: Secondary | ICD-10-CM | POA: Diagnosis not present

## 2019-08-17 DIAGNOSIS — E1122 Type 2 diabetes mellitus with diabetic chronic kidney disease: Secondary | ICD-10-CM | POA: Diagnosis not present

## 2019-08-17 DIAGNOSIS — Z992 Dependence on renal dialysis: Secondary | ICD-10-CM | POA: Diagnosis not present

## 2019-08-18 DIAGNOSIS — E119 Type 2 diabetes mellitus without complications: Secondary | ICD-10-CM | POA: Diagnosis not present

## 2019-08-18 DIAGNOSIS — Z23 Encounter for immunization: Secondary | ICD-10-CM | POA: Diagnosis not present

## 2019-08-18 DIAGNOSIS — Z992 Dependence on renal dialysis: Secondary | ICD-10-CM | POA: Diagnosis not present

## 2019-08-18 DIAGNOSIS — B9689 Other specified bacterial agents as the cause of diseases classified elsewhere: Secondary | ICD-10-CM | POA: Diagnosis not present

## 2019-08-18 DIAGNOSIS — N186 End stage renal disease: Secondary | ICD-10-CM | POA: Diagnosis not present

## 2019-08-18 DIAGNOSIS — Z794 Long term (current) use of insulin: Secondary | ICD-10-CM | POA: Diagnosis not present

## 2019-08-18 DIAGNOSIS — R7881 Bacteremia: Secondary | ICD-10-CM | POA: Diagnosis not present

## 2019-08-21 DIAGNOSIS — N186 End stage renal disease: Secondary | ICD-10-CM | POA: Diagnosis not present

## 2019-08-21 DIAGNOSIS — Z23 Encounter for immunization: Secondary | ICD-10-CM | POA: Diagnosis not present

## 2019-08-21 DIAGNOSIS — R7881 Bacteremia: Secondary | ICD-10-CM | POA: Diagnosis not present

## 2019-08-21 DIAGNOSIS — Z992 Dependence on renal dialysis: Secondary | ICD-10-CM | POA: Diagnosis not present

## 2019-08-21 DIAGNOSIS — B9689 Other specified bacterial agents as the cause of diseases classified elsewhere: Secondary | ICD-10-CM | POA: Diagnosis not present

## 2019-08-23 ENCOUNTER — Other Ambulatory Visit: Payer: Self-pay

## 2019-08-23 ENCOUNTER — Ambulatory Visit (HOSPITAL_COMMUNITY)
Admission: RE | Admit: 2019-08-23 | Discharge: 2019-08-23 | Disposition: A | Discharge status: Home / Self Care | Payer: Medicare PPO | Source: Ambulatory Visit | Attending: Family Medicine | Admitting: Family Medicine

## 2019-08-23 DIAGNOSIS — Z23 Encounter for immunization: Secondary | ICD-10-CM | POA: Diagnosis not present

## 2019-08-23 DIAGNOSIS — N186 End stage renal disease: Secondary | ICD-10-CM | POA: Diagnosis not present

## 2019-08-23 DIAGNOSIS — R7881 Bacteremia: Secondary | ICD-10-CM | POA: Diagnosis not present

## 2019-08-23 DIAGNOSIS — M545 Low back pain, unspecified: Secondary | ICD-10-CM

## 2019-08-23 DIAGNOSIS — B9689 Other specified bacterial agents as the cause of diseases classified elsewhere: Secondary | ICD-10-CM | POA: Diagnosis not present

## 2019-08-23 DIAGNOSIS — Z992 Dependence on renal dialysis: Secondary | ICD-10-CM | POA: Diagnosis not present

## 2019-08-23 DIAGNOSIS — M48061 Spinal stenosis, lumbar region without neurogenic claudication: Secondary | ICD-10-CM | POA: Diagnosis not present

## 2019-08-23 DIAGNOSIS — M549 Dorsalgia, unspecified: Secondary | ICD-10-CM | POA: Diagnosis not present

## 2019-08-24 DIAGNOSIS — Z8673 Personal history of transient ischemic attack (TIA), and cerebral infarction without residual deficits: Secondary | ICD-10-CM | POA: Diagnosis not present

## 2019-08-24 DIAGNOSIS — E1165 Type 2 diabetes mellitus with hyperglycemia: Secondary | ICD-10-CM | POA: Diagnosis not present

## 2019-08-24 DIAGNOSIS — N186 End stage renal disease: Secondary | ICD-10-CM | POA: Diagnosis not present

## 2019-08-24 DIAGNOSIS — R7881 Bacteremia: Secondary | ICD-10-CM | POA: Diagnosis not present

## 2019-08-24 DIAGNOSIS — M4640 Discitis, unspecified, site unspecified: Secondary | ICD-10-CM | POA: Diagnosis not present

## 2019-08-24 DIAGNOSIS — Z992 Dependence on renal dialysis: Secondary | ICD-10-CM | POA: Diagnosis not present

## 2019-08-24 DIAGNOSIS — E1122 Type 2 diabetes mellitus with diabetic chronic kidney disease: Secondary | ICD-10-CM | POA: Diagnosis not present

## 2019-08-24 DIAGNOSIS — M48061 Spinal stenosis, lumbar region without neurogenic claudication: Secondary | ICD-10-CM | POA: Diagnosis not present

## 2019-08-24 DIAGNOSIS — I10 Essential (primary) hypertension: Secondary | ICD-10-CM | POA: Diagnosis not present

## 2019-08-25 DIAGNOSIS — R7881 Bacteremia: Secondary | ICD-10-CM | POA: Diagnosis not present

## 2019-08-25 DIAGNOSIS — Z992 Dependence on renal dialysis: Secondary | ICD-10-CM | POA: Diagnosis not present

## 2019-08-25 DIAGNOSIS — B9689 Other specified bacterial agents as the cause of diseases classified elsewhere: Secondary | ICD-10-CM | POA: Diagnosis not present

## 2019-08-25 DIAGNOSIS — N186 End stage renal disease: Secondary | ICD-10-CM | POA: Diagnosis not present

## 2019-08-28 DIAGNOSIS — N186 End stage renal disease: Secondary | ICD-10-CM | POA: Diagnosis not present

## 2019-08-28 DIAGNOSIS — R7881 Bacteremia: Secondary | ICD-10-CM | POA: Diagnosis not present

## 2019-08-28 DIAGNOSIS — B9689 Other specified bacterial agents as the cause of diseases classified elsewhere: Secondary | ICD-10-CM | POA: Diagnosis not present

## 2019-08-28 DIAGNOSIS — Z992 Dependence on renal dialysis: Secondary | ICD-10-CM | POA: Diagnosis not present

## 2019-08-29 DIAGNOSIS — I120 Hypertensive chronic kidney disease with stage 5 chronic kidney disease or end stage renal disease: Secondary | ICD-10-CM | POA: Diagnosis not present

## 2019-08-29 DIAGNOSIS — I209 Angina pectoris, unspecified: Secondary | ICD-10-CM | POA: Diagnosis not present

## 2019-08-29 DIAGNOSIS — Z6820 Body mass index (BMI) 20.0-20.9, adult: Secondary | ICD-10-CM | POA: Diagnosis not present

## 2019-08-29 DIAGNOSIS — Z794 Long term (current) use of insulin: Secondary | ICD-10-CM | POA: Diagnosis not present

## 2019-08-29 DIAGNOSIS — E1122 Type 2 diabetes mellitus with diabetic chronic kidney disease: Secondary | ICD-10-CM | POA: Diagnosis not present

## 2019-08-29 DIAGNOSIS — N186 End stage renal disease: Secondary | ICD-10-CM | POA: Diagnosis not present

## 2019-08-29 DIAGNOSIS — Z7902 Long term (current) use of antithrombotics/antiplatelets: Secondary | ICD-10-CM | POA: Diagnosis not present

## 2019-08-29 DIAGNOSIS — D631 Anemia in chronic kidney disease: Secondary | ICD-10-CM | POA: Diagnosis not present

## 2019-08-29 DIAGNOSIS — E785 Hyperlipidemia, unspecified: Secondary | ICD-10-CM | POA: Diagnosis not present

## 2019-08-30 DIAGNOSIS — N186 End stage renal disease: Secondary | ICD-10-CM | POA: Diagnosis not present

## 2019-08-30 DIAGNOSIS — B9689 Other specified bacterial agents as the cause of diseases classified elsewhere: Secondary | ICD-10-CM | POA: Diagnosis not present

## 2019-08-30 DIAGNOSIS — R7881 Bacteremia: Secondary | ICD-10-CM | POA: Diagnosis not present

## 2019-08-30 DIAGNOSIS — Z992 Dependence on renal dialysis: Secondary | ICD-10-CM | POA: Diagnosis not present

## 2019-09-01 DIAGNOSIS — R7881 Bacteremia: Secondary | ICD-10-CM | POA: Diagnosis not present

## 2019-09-01 DIAGNOSIS — N186 End stage renal disease: Secondary | ICD-10-CM | POA: Diagnosis not present

## 2019-09-01 DIAGNOSIS — B9689 Other specified bacterial agents as the cause of diseases classified elsewhere: Secondary | ICD-10-CM | POA: Diagnosis not present

## 2019-09-01 DIAGNOSIS — Z992 Dependence on renal dialysis: Secondary | ICD-10-CM | POA: Diagnosis not present

## 2019-09-04 DIAGNOSIS — R7881 Bacteremia: Secondary | ICD-10-CM | POA: Diagnosis not present

## 2019-09-04 DIAGNOSIS — B9689 Other specified bacterial agents as the cause of diseases classified elsewhere: Secondary | ICD-10-CM | POA: Diagnosis not present

## 2019-09-04 DIAGNOSIS — N186 End stage renal disease: Secondary | ICD-10-CM | POA: Diagnosis not present

## 2019-09-04 DIAGNOSIS — Z992 Dependence on renal dialysis: Secondary | ICD-10-CM | POA: Diagnosis not present

## 2019-09-06 DIAGNOSIS — N186 End stage renal disease: Secondary | ICD-10-CM | POA: Diagnosis not present

## 2019-09-06 DIAGNOSIS — R7881 Bacteremia: Secondary | ICD-10-CM | POA: Diagnosis not present

## 2019-09-06 DIAGNOSIS — Z992 Dependence on renal dialysis: Secondary | ICD-10-CM | POA: Diagnosis not present

## 2019-09-06 DIAGNOSIS — B9689 Other specified bacterial agents as the cause of diseases classified elsewhere: Secondary | ICD-10-CM | POA: Diagnosis not present

## 2019-09-09 ENCOUNTER — Inpatient Hospital Stay (HOSPITAL_COMMUNITY)
Admission: EM | Admit: 2019-09-09 | Discharge: 2019-09-12 | DRG: 682 | Disposition: A | Discharge status: Home / Self Care | Payer: Medicare PPO | Attending: Family Medicine | Admitting: Family Medicine

## 2019-09-09 ENCOUNTER — Emergency Department (HOSPITAL_COMMUNITY): Payer: Medicare PPO

## 2019-09-09 ENCOUNTER — Other Ambulatory Visit: Payer: Self-pay

## 2019-09-09 ENCOUNTER — Encounter (HOSPITAL_COMMUNITY): Payer: Self-pay

## 2019-09-09 DIAGNOSIS — Z8249 Family history of ischemic heart disease and other diseases of the circulatory system: Secondary | ICD-10-CM

## 2019-09-09 DIAGNOSIS — Z794 Long term (current) use of insulin: Secondary | ICD-10-CM

## 2019-09-09 DIAGNOSIS — G9341 Metabolic encephalopathy: Secondary | ICD-10-CM | POA: Diagnosis present

## 2019-09-09 DIAGNOSIS — Z79899 Other long term (current) drug therapy: Secondary | ICD-10-CM

## 2019-09-09 DIAGNOSIS — Z833 Family history of diabetes mellitus: Secondary | ICD-10-CM | POA: Diagnosis not present

## 2019-09-09 DIAGNOSIS — E785 Hyperlipidemia, unspecified: Secondary | ICD-10-CM | POA: Diagnosis present

## 2019-09-09 DIAGNOSIS — Z90710 Acquired absence of both cervix and uterus: Secondary | ICD-10-CM

## 2019-09-09 DIAGNOSIS — E8889 Other specified metabolic disorders: Secondary | ICD-10-CM | POA: Diagnosis present

## 2019-09-09 DIAGNOSIS — N2581 Secondary hyperparathyroidism of renal origin: Secondary | ICD-10-CM | POA: Diagnosis present

## 2019-09-09 DIAGNOSIS — Z8673 Personal history of transient ischemic attack (TIA), and cerebral infarction without residual deficits: Secondary | ICD-10-CM

## 2019-09-09 DIAGNOSIS — M19041 Primary osteoarthritis, right hand: Secondary | ICD-10-CM | POA: Diagnosis present

## 2019-09-09 DIAGNOSIS — Z992 Dependence on renal dialysis: Secondary | ICD-10-CM | POA: Diagnosis not present

## 2019-09-09 DIAGNOSIS — N186 End stage renal disease: Secondary | ICD-10-CM

## 2019-09-09 DIAGNOSIS — M19042 Primary osteoarthritis, left hand: Secondary | ICD-10-CM | POA: Diagnosis present

## 2019-09-09 DIAGNOSIS — Z20828 Contact with and (suspected) exposure to other viral communicable diseases: Secondary | ICD-10-CM | POA: Diagnosis present

## 2019-09-09 DIAGNOSIS — R1110 Vomiting, unspecified: Secondary | ICD-10-CM | POA: Diagnosis not present

## 2019-09-09 DIAGNOSIS — Z9115 Patient's noncompliance with renal dialysis: Secondary | ICD-10-CM

## 2019-09-09 DIAGNOSIS — R112 Nausea with vomiting, unspecified: Secondary | ICD-10-CM | POA: Diagnosis present

## 2019-09-09 DIAGNOSIS — E1122 Type 2 diabetes mellitus with diabetic chronic kidney disease: Secondary | ICD-10-CM | POA: Diagnosis present

## 2019-09-09 DIAGNOSIS — E875 Hyperkalemia: Secondary | ICD-10-CM | POA: Diagnosis present

## 2019-09-09 DIAGNOSIS — R000 Tachycardia, unspecified: Secondary | ICD-10-CM | POA: Diagnosis not present

## 2019-09-09 DIAGNOSIS — I120 Hypertensive chronic kidney disease with stage 5 chronic kidney disease or end stage renal disease: Principal | ICD-10-CM | POA: Diagnosis present

## 2019-09-09 DIAGNOSIS — Z7982 Long term (current) use of aspirin: Secondary | ICD-10-CM | POA: Diagnosis not present

## 2019-09-09 DIAGNOSIS — R410 Disorientation, unspecified: Secondary | ICD-10-CM | POA: Diagnosis not present

## 2019-09-09 DIAGNOSIS — D631 Anemia in chronic kidney disease: Secondary | ICD-10-CM | POA: Diagnosis present

## 2019-09-09 DIAGNOSIS — K219 Gastro-esophageal reflux disease without esophagitis: Secondary | ICD-10-CM | POA: Diagnosis present

## 2019-09-09 DIAGNOSIS — Z7902 Long term (current) use of antithrombotics/antiplatelets: Secondary | ICD-10-CM

## 2019-09-09 DIAGNOSIS — K529 Noninfective gastroenteritis and colitis, unspecified: Secondary | ICD-10-CM | POA: Diagnosis present

## 2019-09-09 DIAGNOSIS — R739 Hyperglycemia, unspecified: Secondary | ICD-10-CM | POA: Diagnosis not present

## 2019-09-09 DIAGNOSIS — R29818 Other symptoms and signs involving the nervous system: Secondary | ICD-10-CM | POA: Diagnosis not present

## 2019-09-09 DIAGNOSIS — R197 Diarrhea, unspecified: Secondary | ICD-10-CM | POA: Diagnosis not present

## 2019-09-09 LAB — CBG MONITORING, ED: Glucose-Capillary: 259 mg/dL — ABNORMAL HIGH (ref 70–99)

## 2019-09-09 MED ORDER — IOHEXOL 300 MG/ML  SOLN
100.0000 mL | Freq: Once | INTRAMUSCULAR | Status: AC | PRN
  Administered 2019-09-10: 100 mL via INTRAVENOUS

## 2019-09-09 MED ORDER — SODIUM CHLORIDE 0.9 % IV SOLN: INTRAVENOUS | Status: DC

## 2019-09-09 MED ORDER — ONDANSETRON HCL 4 MG/2ML IJ SOLN
4.0000 mg | Freq: Once | INTRAMUSCULAR | Status: AC
  Administered 2019-09-10: 4 mg via INTRAVENOUS
  Filled 2019-09-09: qty 2

## 2019-09-09 NOTE — ED Triage Notes (Signed)
Pt brought in by son who states that her sugar was low. 104 on their glucometer. Said she ate some chinese to bring sugar up. and now she's vomiting. Son said that her sugar usually 140.

## 2019-09-09 NOTE — ED Notes (Signed)
Dialysis M-W-F. Missed appointment Friday. Because [birth year] was her [birth year]

## 2019-09-09 NOTE — ED Notes (Signed)
Pt receives dialysis on MWF and did not go yesterday. Pt started vomiting today.

## 2019-09-09 NOTE — ED Provider Notes (Addendum)
[facility] EMERGENCY DEPARTMENT Provider Note   CSN: [id number] Arrival date & time: 09/09/19  2044     History   Chief Complaint Chief Complaint  Patient presents with   Emesis    HPI [name] is a 68 y.o. female.     Patient is a dialysis patient.  Normally dialyzed Monday Wednesdays and Fridays.  Missed on Friday because it was her birthday.  Today patient's blood sugar got low at home family tested it it was like 104 110.  Patient then was given some [place] food to bring her sugar up and then she started with vomiting and then diarrhea no blood on either 1.  Patient stated her blood sugar is usually 140.  Patient also has crampy abdominal pain.  Patient's been on dialysis for over a year.  Patient was feeling fine earlier in the day.  Patient denying any shortness of breath or fevers.     Past Medical History:  Diagnosis Date   Arthritis    hands   Constipation    Diabetes mellitus without complication ([place])    Type II   ESRD (end stage renal disease) ([place])     M/W/F- Hemodialysis   GERD (gastroesophageal reflux disease)    Headache    Hyperlipidemia    Hypertension    Irregular heart rate    Stroke ([place])    TIA - approx 2010- no residual    Patient Active Problem List   Diagnosis Date Noted   Bacteremia 07/20/2019   ESRD needing dialysis ([place]) 05/15/2019   Anemia of chronic renal failure 04/03/2018   Essential hypertension 04/03/2018   Type 2 diabetes mellitus with diabetic nephropathy ([place]) 04/03/2018    Past Surgical History:  Procedure Laterality Date   ABDOMINAL HYSTERECTOMY     AORTIC ARCH ANGIOGRAPHY N/A 03/28/2019   Procedure: AORTIC ARCH ANGIOGRAPHY;  Surgeon: Waynetta Sandy, MD;  Location: [place] [facility];  Service: Cardiovascular;  Laterality: N/A;   AV FISTULA PLACEMENT Left 06/13/2018   Procedure: ARTERIOVENOUS (AV) FISTULA CREATION;  Surgeon: Rosetta Posner, MD;  Location: [place];  Service: Vascular;   Laterality: Left;   AV FISTULA PLACEMENT Left 08/18/2018   Procedure: INSERTION OF 4-7MM X 45CM ARTERIOVENOUS (AV) GORE-TEX GRAFT LEFT  FOREARM;  Surgeon: Rosetta Posner, MD;  Location: [place];  Service: Vascular;  Laterality: Left;   AV FISTULA PLACEMENT Right 04/06/2019   Procedure: INSERTION OF ARTERIOVENOUS (AV) GORE-TEX GRAFT RIGHT UPPER ARM;  Surgeon: Waynetta Sandy, MD;  Location: [place];  Service: Vascular;  Laterality: Right;   AV FISTULA PLACEMENT Right 04/13/2019   Procedure: INSERTION OF ARTERIOVENOUS (AV) BOVINE  ARTEGRAFT GRAFT RIGHT UPPER EXTREMITY;  Surgeon: Serafina Mitchell, MD;  Location: [place];  Service: Vascular;  Laterality: Right;   [place] REMOVAL Right 05/16/2019   Procedure: REMOVAL OF ARTERIOVENOUS GORETEX GRAFT ([place]) RIGHT ARM;  Surgeon: Angelia Mould, MD;  Location: [place];  Service: Vascular;  Laterality: Right;   [place] Right 03/07/2019   Procedure: First Stage Bascilic Vein Transposition Right Arm;  Surgeon: Serafina Mitchell, MD;  Location: [place];  Service: Vascular;  Laterality: Right;   CATARACT EXTRACTION Right 2005   INSERTION OF DIALYSIS CATHETER N/A 08/18/2018   Procedure: INSERTION OF DIALYSIS CATHETER;  Surgeon: Rosetta Posner, MD;  [facility];  Service: Vascular;  Laterality: N/A;   INSERTION OF DIALYSIS CATHETER Right 07/25/2019   Procedure: INSERTION OF DIALYSIS CATHETER RIGHT INTERNAL JUGULAR (TUNNELED);  Surgeon: Virl Cagey, MD;  Location: [facility];  Service: General;  Laterality: Right;   IR FLUORO GUIDE CV LINE RIGHT  12/06/2018   IR PTA ADDL CENTRAL DIALYSIS SEG THRU DIALY CIRCUIT RIGHT Right 12/06/2018   THROMBECTOMY AND REVISION OF ARTERIOVENTOUS (AV) GORETEX  GRAFT Left 09/29/2018   Procedure: INSERTION OF ARTERIOVENTOUS (AV) GORETEX  GRAFT ARM;  Surgeon: Waynetta Sandy, MD;  Location: [place];  Service: Vascular;  Laterality: Left;   UPPER EXTREMITY ANGIOGRAPHY Right 03/28/2019   Procedure:  UPPER EXTREMITY ANGIOGRAPHY;  Surgeon: Waynetta Sandy, MD;  Location: [place] [facility];  Service: Cardiovascular;  Laterality: Right;   VENOGRAM  10/27/2018   Procedure: Venogram;  Surgeon: Marty Heck, MD;  Location: [place] [facility];  Service: Cardiovascular;;  bilateral arm     OB History    Gravida  1   Para  1   Term  1   Preterm      AB      Living        SAB      TAB      Ectopic      Multiple      Live Births               Home Medications    Prior to Admission medications   Medication Sig Start Date End Date Taking? Authorizing Provider  amLODipine (NORVASC) 10 MG tablet Take 10 mg by mouth daily after lunch.    Yes [provider]  aspirin EC 81 MG tablet Take 81 mg by mouth daily after lunch.    Yes [provider]  calcium carbonate (TUMS - DOSED IN MG ELEMENTAL CALCIUM) 500 MG chewable tablet Chew 1 tablet by mouth 2 (two) times daily. With lunch & supper   Yes [provider]  carvedilol (COREG) 6.25 MG tablet Take 6.25 mg by mouth 2 (two) times daily. 02/01/19  Yes [provider]  clopidogrel (PLAVIX) 75 MG tablet Take 1 tablet (75 mg total) by mouth daily. 04/13/19  Yes Serafina Mitchell, MD  fluticasone (FLONASE) 50 MCG/ACT nasal spray Place 1 spray into both nostrils daily as needed for allergies or rhinitis.    Yes [provider]  insulin NPH-regular Human (NOVOLIN 70/30) (70-30) 100 UNIT/ML injection Inject 10-20 Units into the skin See admin instructions. Use twice daily per sliding scale: Blood sugar less than 150=12 units & Blood sugar 200 or more=20 units   Yes [provider]  multivitamin (RENA-VIT) TABS tablet Take 1 tablet by mouth daily after lunch.  03/31/19  Yes [provider]  isosorbide mononitrate (IMDUR) 30 MG 24 hr tablet Take 30 mg by mouth daily after lunch.     [provider]    Family History Family History  Problem Relation Age of  Onset   Heart murmur Mother    Heart failure Mother    Diabetes Mother    Cirrhosis Father    Alcoholism Father     Social History Social History   Tobacco Use   Smoking status: Never Smoker   Smokeless tobacco: Never Used  Substance Use Topics   Alcohol use: Never    Frequency: Never   Drug use: Never     Allergies   Patient has no known allergies.   Review of Systems Review of Systems  Constitutional: Negative for chills and fever.  HENT: Negative for congestion, rhinorrhea and sore throat.   Eyes: Negative for visual  disturbance.  Respiratory: Negative for cough and shortness of breath.   Cardiovascular: Negative for chest pain and leg swelling.  Gastrointestinal: Positive for abdominal pain, diarrhea, nausea and vomiting. Negative for blood in stool.  Musculoskeletal: Negative for back pain and neck pain.  Skin: Negative for rash.  Neurological: Negative for dizziness, light-headedness and headaches.  Hematological: Bruises/bleeds easily.  Psychiatric/Behavioral: Negative for confusion.     Physical Exam Updated Vital Signs BP (!) 184/105    Pulse (!) 129    Temp 98.4 F (36.9 C)    Resp 15    Ht 1.702 m (5\' 7" )    Wt 63.5 kg    SpO2 97%    BMI 21.93 kg/m   Physical Exam Vitals signs and nursing note reviewed.  Constitutional:      General: She is not in acute distress.    Appearance: Normal appearance. She is well-developed. She is ill-appearing.  HENT:     Head: Normocephalic and atraumatic.  Eyes:     Extraocular Movements: Extraocular movements intact.     Conjunctiva/sclera: Conjunctivae normal.     Pupils: Pupils are equal, round, and reactive to light.  Neck:     Musculoskeletal: Normal range of motion and neck supple.  Cardiovascular:     Rate and Rhythm: Normal rate and regular rhythm.     Heart sounds: No murmur.  Pulmonary:     Effort: Pulmonary effort is normal. No respiratory distress.     Breath sounds: Normal breath sounds.       Comments: Patient's dialysis catheter right anterior chest area. Abdominal:     Palpations: Abdomen is soft.     Tenderness: There is no abdominal tenderness.  Musculoskeletal: Normal range of motion.  Skin:    General: Skin is warm and dry.  Neurological:     General: No focal deficit present.     Mental Status: She is alert and oriented to person, place, and time.      ED Treatments / Results  Labs (all labs ordered are listed, but only abnormal results are displayed) Labs Reviewed  CBG MONITORING, ED - Abnormal; Notable for the following components:      Result Value   Glucose-Capillary 259 (*)    All other components within normal limits  SARS CORONAVIRUS 2 (TAT 6-24 HRS)  COMPREHENSIVE METABOLIC PANEL  LIPASE, BLOOD  CBC WITH DIFFERENTIAL/PLATELET    EKG EKG Interpretation  Date/Time:  Saturday September 09 2019 21:06:18 EDT Ventricular Rate:  126 PR Interval:    QRS Duration: 75 QT Interval:  314 QTC Calculation: 455 R Axis:   72 Text Interpretation:  Sinus tachycardia Anteroseptal infarct, old Confirmed by Fredia Sorrow [phone]) on 09/09/2019 10:12:54 PM   Radiology Dg Chest Port 1 View  Result Date: 09/09/2019 CLINICAL DATA:  68 year old female missed dialysis this week. Hyperglycemia and vomiting. EXAM: PORTABLE CHEST 1 VIEW COMPARISON:  Portable chest 07/25/2019 and earlier. FINDINGS: Portable AP upright view at 2247 hours. Larger lung volumes. Stable right chest tunneled type dual lumen dialysis catheter. Stable cardiac size and mediastinal contours. Cardiac size at the upper limits of normal. Visualized tracheal air column is within normal limits. Allowing for portable technique the lungs are clear. Paucity of bowel gas in the upper abdomen. No acute osseous abnormality identified. IMPRESSION: Improved lung volumes.  No acute cardiopulmonary abnormality. Electronically Signed   By: Genevie Ann M.D.   On: 09/09/2019 22:55    Procedures Procedures  (including critical care time)  Medications Ordered  in ED Medications  0.9 %  sodium chloride infusion (has no administration in time range)  ondansetron (ZOFRAN) injection 4 mg (has no administration in time range)  iohexol (OMNIPAQUE) 300 MG/ML solution 100 mL (has no administration in time range)     Initial Impression / Assessment and Plan / ED Course  I have reviewed the triage vital signs and the nursing notes.  Pertinent labs & imaging results that were available during my care of the patient were reviewed by me and considered in my medical decision making (see chart for details).        Patient symptoms suggestive of acute onset gastroenteritis.  She is a dialysis patient.  Normally dialyzed Monday Wednesdays Fridays.  Missed Friday.  This was acute onset here tonight.  The family reported a little bit of low blood sugars but it was never below 110.  She had [place] food and then the vomiting and diarrhea started no blood in either 1.  Patient is got a CT scan of the abdomen this will do direct disposition.  Labs are still pending.  Chest x-ray without any acute findings no evidence of any significant pulmonary edema.  Patient's oxygen saturations here are 97% on room air.  Patient with a little tachycardia with heart rate around 115.  Afebrile.  Blood pressure slightly elevated 180s.  Final Clinical Impressions(s) / ED Diagnoses   Final diagnoses:  Nausea vomiting and diarrhea  End-stage renal disease on hemodialysis [facility])    ED Discharge Orders    None       Fredia Sorrow, MD 09/10/19 [place]    Fredia Sorrow, MD 09/10/19 0004

## 2019-09-10 ENCOUNTER — Inpatient Hospital Stay (HOSPITAL_COMMUNITY): Payer: Medicare PPO

## 2019-09-10 DIAGNOSIS — M19042 Primary osteoarthritis, left hand: Secondary | ICD-10-CM | POA: Diagnosis present

## 2019-09-10 DIAGNOSIS — R112 Nausea with vomiting, unspecified: Secondary | ICD-10-CM | POA: Diagnosis present

## 2019-09-10 DIAGNOSIS — Z8249 Family history of ischemic heart disease and other diseases of the circulatory system: Secondary | ICD-10-CM | POA: Diagnosis not present

## 2019-09-10 DIAGNOSIS — N186 End stage renal disease: Secondary | ICD-10-CM

## 2019-09-10 DIAGNOSIS — Z8673 Personal history of transient ischemic attack (TIA), and cerebral infarction without residual deficits: Secondary | ICD-10-CM | POA: Diagnosis not present

## 2019-09-10 DIAGNOSIS — Z7902 Long term (current) use of antithrombotics/antiplatelets: Secondary | ICD-10-CM | POA: Diagnosis not present

## 2019-09-10 DIAGNOSIS — M19041 Primary osteoarthritis, right hand: Secondary | ICD-10-CM | POA: Diagnosis present

## 2019-09-10 DIAGNOSIS — E875 Hyperkalemia: Secondary | ICD-10-CM | POA: Diagnosis present

## 2019-09-10 DIAGNOSIS — Z79899 Other long term (current) drug therapy: Secondary | ICD-10-CM | POA: Diagnosis not present

## 2019-09-10 DIAGNOSIS — K219 Gastro-esophageal reflux disease without esophagitis: Secondary | ICD-10-CM | POA: Diagnosis present

## 2019-09-10 DIAGNOSIS — I120 Hypertensive chronic kidney disease with stage 5 chronic kidney disease or end stage renal disease: Secondary | ICD-10-CM | POA: Diagnosis present

## 2019-09-10 DIAGNOSIS — Z833 Family history of diabetes mellitus: Secondary | ICD-10-CM | POA: Diagnosis not present

## 2019-09-10 DIAGNOSIS — Z992 Dependence on renal dialysis: Secondary | ICD-10-CM | POA: Diagnosis not present

## 2019-09-10 DIAGNOSIS — Z20828 Contact with and (suspected) exposure to other viral communicable diseases: Secondary | ICD-10-CM | POA: Diagnosis present

## 2019-09-10 DIAGNOSIS — E8889 Other specified metabolic disorders: Secondary | ICD-10-CM | POA: Diagnosis present

## 2019-09-10 DIAGNOSIS — N2581 Secondary hyperparathyroidism of renal origin: Secondary | ICD-10-CM | POA: Diagnosis present

## 2019-09-10 DIAGNOSIS — E1122 Type 2 diabetes mellitus with diabetic chronic kidney disease: Secondary | ICD-10-CM | POA: Diagnosis present

## 2019-09-10 DIAGNOSIS — Z9115 Patient's noncompliance with renal dialysis: Secondary | ICD-10-CM | POA: Diagnosis not present

## 2019-09-10 DIAGNOSIS — R197 Diarrhea, unspecified: Secondary | ICD-10-CM

## 2019-09-10 DIAGNOSIS — Z7982 Long term (current) use of aspirin: Secondary | ICD-10-CM | POA: Diagnosis not present

## 2019-09-10 DIAGNOSIS — D631 Anemia in chronic kidney disease: Secondary | ICD-10-CM | POA: Diagnosis present

## 2019-09-10 DIAGNOSIS — E785 Hyperlipidemia, unspecified: Secondary | ICD-10-CM | POA: Diagnosis present

## 2019-09-10 DIAGNOSIS — Z794 Long term (current) use of insulin: Secondary | ICD-10-CM | POA: Diagnosis not present

## 2019-09-10 DIAGNOSIS — Z90710 Acquired absence of both cervix and uterus: Secondary | ICD-10-CM | POA: Diagnosis not present

## 2019-09-10 DIAGNOSIS — G9341 Metabolic encephalopathy: Secondary | ICD-10-CM | POA: Diagnosis present

## 2019-09-10 DIAGNOSIS — K529 Noninfective gastroenteritis and colitis, unspecified: Secondary | ICD-10-CM | POA: Diagnosis present

## 2019-09-10 LAB — CBC
HCT: 39 % (ref 36.0–46.0)
Hemoglobin: 11.8 g/dL — ABNORMAL LOW (ref 12.0–15.0)
MCH: 27.3 pg (ref 26.0–34.0)
MCHC: 30.3 g/dL (ref 30.0–36.0)
MCV: 90.3 fL (ref 80.0–100.0)
Platelets: 237 10*3/uL (ref 150–400)
RBC: 4.32 MIL/uL (ref 3.87–5.11)
RDW: 17.6 % — ABNORMAL HIGH (ref 11.5–15.5)
WBC: 7.5 10*3/uL (ref 4.0–10.5)
nRBC: 0 % (ref 0.0–0.2)

## 2019-09-10 LAB — COMPREHENSIVE METABOLIC PANEL
ALT: 21 U/L (ref 0–44)
ALT: 27 U/L (ref 0–44)
AST: 17 U/L (ref 15–41)
AST: 26 U/L (ref 15–41)
Albumin: 2.9 g/dL — ABNORMAL LOW (ref 3.5–5.0)
Albumin: 3.1 g/dL — ABNORMAL LOW (ref 3.5–5.0)
Alkaline Phosphatase: 106 U/L (ref 38–126)
Alkaline Phosphatase: 92 U/L (ref 38–126)
Anion gap: 14 (ref 5–15)
Anion gap: 16 — ABNORMAL HIGH (ref 5–15)
BUN: 55 mg/dL — ABNORMAL HIGH (ref 8–23)
BUN: 58 mg/dL — ABNORMAL HIGH (ref 8–23)
CO2: 21 mmol/L — ABNORMAL LOW (ref 22–32)
CO2: 23 mmol/L (ref 22–32)
Calcium: 8.7 mg/dL — ABNORMAL LOW (ref 8.9–10.3)
Calcium: 8.8 mg/dL — ABNORMAL LOW (ref 8.9–10.3)
Chloride: 102 mmol/L (ref 98–111)
Chloride: 102 mmol/L (ref 98–111)
Creatinine, Ser: 9.66 mg/dL — ABNORMAL HIGH (ref 0.44–1.00)
Creatinine, Ser: 9.95 mg/dL — ABNORMAL HIGH (ref 0.44–1.00)
GFR calc Af Amer: 4 mL/min — ABNORMAL LOW (ref >60)
GFR calc Af Amer: 4 mL/min — ABNORMAL LOW (ref >60)
GFR calc non Af Amer: 4 mL/min — ABNORMAL LOW (ref >60)
GFR calc non Af Amer: 4 mL/min — ABNORMAL LOW (ref >60)
Glucose, Bld: 142 mg/dL — ABNORMAL HIGH (ref 70–99)
Glucose, Bld: 260 mg/dL — ABNORMAL HIGH (ref 70–99)
Potassium: 6.1 mmol/L — ABNORMAL HIGH (ref 3.5–5.1)
Potassium: 6.4 mmol/L (ref 3.5–5.1)
Sodium: 139 mmol/L (ref 135–145)
Sodium: 139 mmol/L (ref 135–145)
Total Bilirubin: 0.2 mg/dL — ABNORMAL LOW (ref 0.3–1.2)
Total Bilirubin: 0.3 mg/dL (ref 0.3–1.2)
Total Protein: 7.5 g/dL (ref 6.5–8.1)
Total Protein: 8.7 g/dL — ABNORMAL HIGH (ref 6.5–8.1)

## 2019-09-10 LAB — CBC WITH DIFFERENTIAL/PLATELET
Abs Immature Granulocytes: 0.05 10*3/uL (ref 0.00–0.07)
Basophils Absolute: 0 10*3/uL (ref 0.0–0.1)
Basophils Relative: 0 %
Eosinophils Absolute: 0.1 10*3/uL (ref 0.0–0.5)
Eosinophils Relative: 1 %
HCT: 41 % (ref 36.0–46.0)
Hemoglobin: 12.3 g/dL (ref 12.0–15.0)
Immature Granulocytes: 1 %
Lymphocytes Relative: 9 %
Lymphs Abs: 0.9 10*3/uL (ref 0.7–4.0)
MCH: 26.9 pg (ref 26.0–34.0)
MCHC: 30 g/dL (ref 30.0–36.0)
MCV: 89.5 fL (ref 80.0–100.0)
Monocytes Absolute: 0.4 10*3/uL (ref 0.1–1.0)
Monocytes Relative: 4 %
Neutro Abs: 9.1 10*3/uL — ABNORMAL HIGH (ref 1.7–7.7)
Neutrophils Relative %: 85 %
Platelets: ADEQUATE 10*3/uL (ref 150–400)
RBC: 4.58 MIL/uL (ref 3.87–5.11)
RDW: 17.7 % — ABNORMAL HIGH (ref 11.5–15.5)
WBC: 10.5 10*3/uL (ref 4.0–10.5)
nRBC: 0 % (ref 0.0–0.2)

## 2019-09-10 LAB — GLUCOSE, CAPILLARY
Glucose-Capillary: 129 mg/dL — ABNORMAL HIGH (ref 70–99)
Glucose-Capillary: 74 mg/dL (ref 70–99)
Glucose-Capillary: 109 mg/dL — ABNORMAL HIGH (ref 70–99)
Glucose-Capillary: 122 mg/dL — ABNORMAL HIGH (ref 70–99)

## 2019-09-10 LAB — LIPASE, BLOOD: Lipase: 41 U/L (ref 11–51)

## 2019-09-10 LAB — SARS CORONAVIRUS 2 (TAT 6-24 HRS): SARS Coronavirus 2: NEGATIVE

## 2019-09-10 LAB — MRSA PCR SCREENING: MRSA by PCR: NEGATIVE

## 2019-09-10 LAB — HEPATITIS B SURFACE ANTIGEN: Hepatitis B Surface Ag: NONREACTIVE

## 2019-09-10 MED ORDER — SODIUM CHLORIDE 0.9% FLUSH: 3.0000 mL | INTRAVENOUS | Status: DC | PRN

## 2019-09-10 MED ORDER — ASPIRIN EC 81 MG PO TBEC
81.0000 mg | DELAYED_RELEASE_TABLET | Freq: Every day | ORAL | Status: DC
  Filled 2019-09-10: qty 1

## 2019-09-10 MED ORDER — CARVEDILOL 3.125 MG PO TABS
6.2500 mg | ORAL_TABLET | Freq: Two times a day (BID) | ORAL | Status: DC
  Administered 2019-09-10 – 2019-09-12 (×5): 6.25 mg via ORAL
  Filled 2019-09-10 (×5): qty 2

## 2019-09-10 MED ORDER — FLUTICASONE PROPIONATE 50 MCG/ACT NA SUSP: 1.0000 | Freq: Every day | NASAL | Status: DC | PRN

## 2019-09-10 MED ORDER — HEPARIN SODIUM (PORCINE) 1000 UNIT/ML DIALYSIS: 1000.0000 [IU] | INTRAMUSCULAR | Status: DC | PRN

## 2019-09-10 MED ORDER — CHLORHEXIDINE GLUCONATE CLOTH 2 % EX PADS
6.0000 | MEDICATED_PAD | Freq: Every day | CUTANEOUS | Status: DC
  Administered 2019-09-10 – 2019-09-11 (×2): 6 via TOPICAL

## 2019-09-10 MED ORDER — ACETAMINOPHEN 325 MG PO TABS
650.0000 mg | ORAL_TABLET | Freq: Four times a day (QID) | ORAL | Status: DC | PRN
  Administered 2019-09-11 (×2): 650 mg via ORAL
  Filled 2019-09-10 (×2): qty 2

## 2019-09-10 MED ORDER — ONDANSETRON HCL 4 MG PO TABS: 4.0000 mg | ORAL_TABLET | Freq: Four times a day (QID) | ORAL | Status: DC | PRN

## 2019-09-10 MED ORDER — AMLODIPINE BESYLATE 5 MG PO TABS
10.0000 mg | ORAL_TABLET | Freq: Every day | ORAL | Status: DC
  Filled 2019-09-10 (×2): qty 2

## 2019-09-10 MED ORDER — SODIUM CHLORIDE 0.9% FLUSH
3.0000 mL | Freq: Two times a day (BID) | INTRAVENOUS | Status: DC
  Administered 2019-09-10 – 2019-09-11 (×4): 3 mL via INTRAVENOUS

## 2019-09-10 MED ORDER — HEPARIN SODIUM (PORCINE) 5000 UNIT/ML IJ SOLN
5000.0000 [IU] | Freq: Three times a day (TID) | INTRAMUSCULAR | Status: DC
  Administered 2019-09-10 – 2019-09-11 (×5): 5000 [IU] via SUBCUTANEOUS
  Filled 2019-09-10 (×6): qty 1

## 2019-09-10 MED ORDER — INSULIN ASPART 100 UNIT/ML ~~LOC~~ SOLN
0.0000 [IU] | Freq: Three times a day (TID) | SUBCUTANEOUS | Status: DC
  Administered 2019-09-10: 1 [IU] via SUBCUTANEOUS

## 2019-09-10 MED ORDER — CALCIUM GLUCONATE-NACL 1-0.675 GM/50ML-% IV SOLN
1.0000 g | Freq: Once | INTRAVENOUS | Status: AC
  Administered 2019-09-10: 04:00:00 1000 mg via INTRAVENOUS
  Filled 2019-09-10: qty 50

## 2019-09-10 MED ORDER — CLOPIDOGREL BISULFATE 75 MG PO TABS
75.0000 mg | ORAL_TABLET | Freq: Every day | ORAL | Status: DC
  Administered 2019-09-10 – 2019-09-12 (×3): 75 mg via ORAL
  Filled 2019-09-10 (×3): qty 1

## 2019-09-10 MED ORDER — CHLORHEXIDINE GLUCONATE CLOTH 2 % EX PADS: 6.0000 | MEDICATED_PAD | Freq: Every day | CUTANEOUS | Status: DC

## 2019-09-10 MED ORDER — HEPARIN SODIUM (PORCINE) 1000 UNIT/ML DIALYSIS: 20.0000 [IU]/kg | INTRAMUSCULAR | Status: DC | PRN

## 2019-09-10 MED ORDER — CALCIUM GLUCONATE-NACL 1-0.675 GM/50ML-% IV SOLN
INTRAVENOUS | Status: AC
  Filled 2019-09-10: qty 50

## 2019-09-10 MED ORDER — SODIUM ZIRCONIUM CYCLOSILICATE 5 G PO PACK
10.0000 g | PACK | ORAL | Status: AC
  Administered 2019-09-10: 03:00:00 10 g via ORAL
  Filled 2019-09-10: qty 2

## 2019-09-10 MED ORDER — SODIUM POLYSTYRENE SULFONATE 15 GM/60ML PO SUSP
45.0000 g | Freq: Once | ORAL | Status: AC
  Administered 2019-09-10: 45 g via ORAL
  Filled 2019-09-10: qty 180

## 2019-09-10 MED ORDER — ONDANSETRON HCL 4 MG/2ML IJ SOLN
4.0000 mg | Freq: Four times a day (QID) | INTRAMUSCULAR | Status: DC | PRN
  Administered 2019-09-10 – 2019-09-11 (×2): 4 mg via INTRAVENOUS
  Filled 2019-09-10 (×2): qty 2

## 2019-09-10 MED ORDER — SODIUM CHLORIDE 0.9 % IV SOLN
1.0000 g | Freq: Once | INTRAVENOUS | Status: DC
  Filled 2019-09-10: qty 10

## 2019-09-10 MED ORDER — SODIUM CHLORIDE 0.9 % IV SOLN: 100.0000 mL | INTRAVENOUS | Status: DC | PRN

## 2019-09-10 MED ORDER — SODIUM CHLORIDE 0.9 % IV SOLN: 250.0000 mL | INTRAVENOUS | Status: DC | PRN

## 2019-09-10 MED ORDER — ACETAMINOPHEN 650 MG RE SUPP: 650.0000 mg | Freq: Four times a day (QID) | RECTAL | Status: DC | PRN

## 2019-09-10 MED ORDER — LABETALOL HCL 5 MG/ML IV SOLN: 10.0000 mg | INTRAVENOUS | Status: DC | PRN

## 2019-09-10 MED ORDER — ALTEPLASE 2 MG IJ SOLR
2.0000 mg | Freq: Once | INTRAMUSCULAR | Status: AC | PRN
  Administered 2019-09-11: 4 mg
  Filled 2019-09-10: qty 2

## 2019-09-10 MED ORDER — HYDRALAZINE HCL 20 MG/ML IJ SOLN
5.0000 mg | INTRAMUSCULAR | Status: DC | PRN
  Administered 2019-09-10: 5 mg via INTRAVENOUS
  Filled 2019-09-10: qty 1

## 2019-09-10 MED ORDER — CHLORHEXIDINE GLUCONATE CLOTH 2 % EX PADS
6.0000 | MEDICATED_PAD | Freq: Every day | CUTANEOUS | Status: DC
  Administered 2019-09-11: 6 via TOPICAL

## 2019-09-10 NOTE — Progress Notes (Signed)
CRITICAL VALUE ALERT  Critical Value:  Potassium 6.4  Date & Time Notied:  09/10/2019 0816  Provider Notified: Dr. Wynetta Emery  Orders Received/Actions taken: awaiting further orders

## 2019-09-10 NOTE — Progress Notes (Signed)
ASSUMPTION OF CARE NOTE   09/10/2019 10:55 AM  [name] was seen and examined.  The H&P by the admitting provider, orders, imaging was reviewed. Dr. Jonnie Finner was consulted and said patient to have HD at [facility] today.  Her K remains elevated.  I ordered a dose of kayexalate to be given this morning.  Continue to follow on telemetry.    Vitals:   09/10/19 0556 09/10/19 0807  BP: (!) 180/97   Pulse: (!) 116   Resp: 18   Temp:    SpO2: 93% 94%    Results for orders placed or performed during the hospital encounter of 09/09/19  MRSA PCR Screening   Specimen: Nasopharyngeal  Result Value Ref Range   MRSA by PCR NEGATIVE NEGATIVE  Comprehensive metabolic panel  Result Value Ref Range   Sodium 139 135 - 145 mmol/L   Potassium 6.1 (H) 3.5 - 5.1 mmol/L   Chloride 102 98 - 111 mmol/L   CO2 21 (L) 22 - 32 mmol/L   Glucose, Bld 260 (H) 70 - 99 mg/dL   BUN 55 (H) 8 - 23 mg/dL   Creatinine, Ser 9.66 (H) 0.44 - 1.00 mg/dL   Calcium 8.8 (L) 8.9 - 10.3 mg/dL   Total Protein 8.7 (H) 6.5 - 8.1 g/dL   Albumin 3.1 (L) 3.5 - 5.0 g/dL   AST 26 15 - 41 U/L   ALT 27 0 - 44 U/L   Alkaline Phosphatase 106 38 - 126 U/L   Total Bilirubin 0.2 (L) 0.3 - 1.2 mg/dL   GFR calc non Af Amer 4 (L) >60 mL/min   GFR calc Af Amer 4 (L) >60 mL/min   Anion gap 16 (H) 5 - 15  Lipase, blood  Result Value Ref Range   Lipase 41 11 - 51 U/L  CBC with Differential/Platelet  Result Value Ref Range   WBC 10.5 4.0 - 10.5 K/uL   RBC 4.58 3.87 - 5.11 MIL/uL   Hemoglobin 12.3 12.0 - 15.0 g/dL   HCT 41.0 36.0 - 46.0 %   MCV 89.5 80.0 - 100.0 fL   MCH 26.9 26.0 - 34.0 pg   MCHC 30.0 30.0 - 36.0 g/dL   RDW 17.7 (H) 11.5 - 15.5 %   Platelets  150 - 400 K/uL    PLATELET CLUMPS NOTED ON SMEAR, COUNT APPEARS ADEQUATE   nRBC 0.0 0.0 - 0.2 %   Neutrophils Relative % 85 %   Neutro Abs 9.1 (H) 1.7 - 7.7 K/uL   Lymphocytes Relative 9 %   Lymphs Abs 0.9 0.7 - 4.0 K/uL   Monocytes Relative 4 %   Monocytes Absolute 0.4  0.1 - 1.0 K/uL   Eosinophils Relative 1 %   Eosinophils Absolute 0.1 0.0 - 0.5 K/uL   Basophils Relative 0 %   Basophils Absolute 0.0 0.0 - 0.1 K/uL   Immature Granulocytes 1 %   Abs Immature Granulocytes 0.05 0.00 - 0.07 K/uL  CBC  Result Value Ref Range   WBC 7.5 4.0 - 10.5 K/uL   RBC 4.32 3.87 - 5.11 MIL/uL   Hemoglobin 11.8 (L) 12.0 - 15.0 g/dL   HCT 39.0 36.0 - 46.0 %   MCV 90.3 80.0 - 100.0 fL   MCH 27.3 26.0 - 34.0 pg   MCHC 30.3 30.0 - 36.0 g/dL   RDW 17.6 (H) 11.5 - 15.5 %   Platelets 237 150 - 400 K/uL   nRBC 0.0 0.0 - 0.2 %  Comprehensive metabolic panel  Result Value Ref Range   Sodium 139 135 - 145 mmol/L   Potassium 6.4 (HH) 3.5 - 5.1 mmol/L   Chloride 102 98 - 111 mmol/L   CO2 23 22 - 32 mmol/L   Glucose, Bld 142 (H) 70 - 99 mg/dL   BUN 58 (H) 8 - 23 mg/dL   Creatinine, Ser 9.95 (H) 0.44 - 1.00 mg/dL   Calcium 8.7 (L) 8.9 - 10.3 mg/dL   Total Protein 7.5 6.5 - 8.1 g/dL   Albumin 2.9 (L) 3.5 - 5.0 g/dL   AST 17 15 - 41 U/L   ALT 21 0 - 44 U/L   Alkaline Phosphatase 92 38 - 126 U/L   Total Bilirubin 0.3 0.3 - 1.2 mg/dL   GFR calc non Af Amer 4 (L) >60 mL/min   GFR calc Af Amer 4 (L) >60 mL/min   Anion gap 14 5 - 15  Glucose, capillary  Result Value Ref Range   Glucose-Capillary 129 (H) 70 - 99 mg/dL   Comment 1 Notify RN    Comment 2 Document in Chart   CBG monitoring, ED  Result Value Ref Range   Glucose-Capillary 259 (H) 70 - 99 mg/dL     Murvin Natal, MD [facility]   09/09/2019  8:49 PM How to contact the [facility] Attending or Consulting provider [place] or covering provider during after hours 7P -7A, for this patient?  1. Check the care team in [facility] and look for a) attending/consulting [facility] provider listed and b) the [facility] team listed 2. Log into [email or web address] and use [place]'s universal password to access. If you do not have the password, please contact the hospital operator. 3. Locate the [facility] provider you are looking for under [facility] and page to a number that you can be directly reached. 4. If you still have difficulty reaching the provider, please page the [facility] (Director on Call) for the Hospitalists listed on amion for assistance.

## 2019-09-10 NOTE — Progress Notes (Signed)
Patient is very sleepy at this time follows command and answer appropirately.

## 2019-09-10 NOTE — Progress Notes (Signed)
Patient sent from ED to floor with BP=179/105, P=110, R=20, O2=98% RA  Once on floor BO=197/115, P=111, R=18, O2=92% RA, given PRN 5mg  Apresoline waited 30 minutes rechecked vitals BP=180/97, P=116, R=18, O2= 93% RA.  Noftified Dr. Darrick Meigs was given order to give patient's 10 am dose of coreg.  Medication given will continue to monitor patient for further changes.

## 2019-09-10 NOTE — ED Provider Notes (Signed)
Patient signed out to me by Dr. Bobby Rumpf.  Patient seen with acute onset of nausea and vomiting after eating [place] food.  Patient has had persistent nausea and vomiting only partially controlled with meds here in the ER.  Patient is a dialysis patient, normally Monday Wednesday Friday, but skipped this Friday because it was her birthday.  Patient does not appear to be volume overloaded but does have an elevated potassium.  She is not due for dialysis for more than 24 hours, will not be able to be discharged.  Discussed with Dr. Burnett Sheng, on-call for nephrology.  Patient okay to be admitted at [facility], administers [place] twice daily and will be dialyzed in the morning.  CT results reviewed.  Question of possible colitis as well as cystitis.  Patient does continue to make urine 2-3 times a day.  Will perform urinalysis and urine culture.  Patient has been on [place] for the last 6 weeks secondary to a Pseudomonas bacteremia, therefore doubt urinary infection.   Orpah Greek, MD 09/10/19 [phone]

## 2019-09-10 NOTE — H&P (Signed)
TRH H&P    Patient Demographics:    [name], is a 68 y.o. female  MRN: [id number]  DOB - 10/21/1951  Admit Date - 09/09/2019  Referring MD/NP/PA: Dr. Adriana Mccallum  Outpatient Primary MD for the patient is Sasser, Silvestre Moment, MD  Patient coming from: Home  Chief complaint-nausea and vomiting   HPI:    [name]  is a 68 y.o. female, with history of ESRD on hemodialysis Monday Wednesday Friday, diabetes mellitus type 2, hypertension, stroke, Pseudomonas bacteremia with concern for infectious discitis/osteomyelitis at L3-L4, completed 6 weeks of antibiotics on Monday, 09/04/2019, came to ED today with complaints of nausea and vomiting.  As per family member, patient blood glucose was 110 which was low for her so they fed her [place] food after that patient started vomiting.  She had very minimal diarrhea.  In the ED CT scan of the abdomen showed equivocal colitis.  Patient denies abdominal pain.  Patient missed her dialysis on Friday as it was her birthday. Denies chest pain or shortness of breath. Denies fever chills She has been coughing occasionally  Lab work in the ED showed potassium 6.1, creatinine 9.66.  Nephrology, Dr. Delena Bali was consulted and he recommended observation for dialysis in a.m.  Patient received calcium gluconate 1 g IV, Lokelma in the ED.    Review of systems:    In addition to the HPI above,    All other systems reviewed and are negative.    Past History of the following :    Past Medical History:  Diagnosis Date   Arthritis    hands   Constipation    Diabetes mellitus without complication ([place])    Type II   ESRD (end stage renal disease) ([place])     M/W/F- Hemodialysis   GERD (gastroesophageal reflux disease)    Headache    Hyperlipidemia    Hypertension    Irregular heart rate    Stroke ([place])    TIA - approx 2010- no residual      Past  Surgical History:  Procedure Laterality Date   ABDOMINAL HYSTERECTOMY     AORTIC ARCH ANGIOGRAPHY N/A 03/28/2019   Procedure: AORTIC ARCH ANGIOGRAPHY;  Surgeon: Waynetta Sandy, MD;  Location: [facility];  Service: Cardiovascular;  Laterality: N/A;   AV FISTULA PLACEMENT Left 06/13/2018   Procedure: ARTERIOVENOUS (AV) FISTULA CREATION;  Surgeon: Rosetta Posner, MD;  Location: [facility];  Service: Vascular;  Laterality: Left;   AV FISTULA PLACEMENT Left 08/18/2018   Procedure: INSERTION OF 4-7MM X 45CM ARTERIOVENOUS (AV) GORE-TEX GRAFT LEFT  FOREARM;  Surgeon: Rosetta Posner, MD;  Location: [facility];  Service: Vascular;  Laterality: Left;   AV FISTULA PLACEMENT Right 04/06/2019   Procedure: INSERTION OF ARTERIOVENOUS (AV) GORE-TEX GRAFT RIGHT UPPER ARM;  Surgeon: Waynetta Sandy, MD;  Location: [facility];  Service: Vascular;  Laterality: Right;   AV FISTULA PLACEMENT Right 04/13/2019   Procedure: INSERTION OF ARTERIOVENOUS (AV) BOVINE  ARTEGRAFT GRAFT RIGHT UPPER EXTREMITY;  Surgeon: Serafina Mitchell, MD;  Location: [facility];  Service: Vascular;  Laterality: Right;   [place] Right 05/16/2019   Procedure: REMOVAL OF ARTERIOVENOUS GORETEX GRAFT ([place]) RIGHT ARM;  Surgeon: Angelia Mould, MD;  Location: [facility];  Service: Vascular;  Laterality: Right;   [place] Right 03/07/2019   Procedure: First Stage Bascilic Vein Transposition Right Arm;  Surgeon: Serafina Mitchell, MD;  Location: [facility];  Service: Vascular;  Laterality: Right;   CATARACT EXTRACTION Right 2005   INSERTION OF DIALYSIS CATHETER N/A 08/18/2018   Procedure: INSERTION OF DIALYSIS CATHETER;  Surgeon: Rosetta Posner, MD;  Location: [facility];  Service: Vascular;  Laterality: N/A;   INSERTION OF DIALYSIS CATHETER Right 07/25/2019   Procedure: INSERTION OF DIALYSIS CATHETER RIGHT INTERNAL JUGULAR (TUNNELED);  Surgeon: Virl Cagey, MD;  Location: [facility];  Service: General;  Laterality: Right;    IR FLUORO GUIDE CV LINE RIGHT  12/06/2018   IR PTA ADDL CENTRAL DIALYSIS SEG THRU DIALY CIRCUIT RIGHT Right 12/06/2018   THROMBECTOMY AND REVISION OF ARTERIOVENTOUS (AV) GORETEX  GRAFT Left 09/29/2018   Procedure: INSERTION OF ARTERIOVENTOUS (AV) GORETEX  GRAFT ARM;  Surgeon: Waynetta Sandy, MD;  Location: [facility];  Service: Vascular;  Laterality: Left;   UPPER EXTREMITY ANGIOGRAPHY Right 03/28/2019   Procedure: UPPER EXTREMITY ANGIOGRAPHY;  Surgeon: Waynetta Sandy, MD;  Location: [facility];  Service: Cardiovascular;  Laterality: Right;   VENOGRAM  10/27/2018   Procedure: Venogram;  Surgeon: Marty Heck, MD;  Location: [facility];  Service: Cardiovascular;;  bilateral arm      Social History:      Social History   Tobacco Use   Smoking status: Never Smoker   Smokeless tobacco: Never Used  Substance Use Topics   Alcohol use: Never    Frequency: Never       Family History :     Family History  Problem Relation Age of Onset   Heart murmur Mother    Heart failure Mother    Diabetes Mother    Cirrhosis Father    Alcoholism Father       Home Medications:   Prior to Admission medications   Medication Sig Start Date End Date Taking? Authorizing Provider  amLODipine (NORVASC) 10 MG tablet Take 10 mg by mouth daily after lunch.    Yes [provider]  aspirin EC 81 MG tablet Take 81 mg by mouth daily after lunch.    Yes [provider]  calcium carbonate (TUMS - DOSED IN MG ELEMENTAL CALCIUM) 500 MG chewable tablet Chew 1 tablet by mouth 2 (two) times daily. With lunch & supper   Yes [provider]  carvedilol (COREG) 6.25 MG tablet Take 6.25 mg by mouth 2 (two) times daily. 02/01/19  Yes [provider]  clopidogrel (PLAVIX) 75 MG tablet Take 1 tablet (75 mg total) by mouth daily. 04/13/19  Yes Serafina Mitchell, MD  fluticasone (FLONASE) 50 MCG/ACT nasal spray Place 1 spray into both nostrils  daily as needed for allergies or rhinitis.    Yes [provider]  insulin NPH-regular Human (NOVOLIN 70/30) (70-30) 100 UNIT/ML injection Inject 10-20 Units into the skin See admin instructions. Use twice daily per sliding scale: Blood sugar less than 150=12 units & Blood sugar 200 or more=20 units   Yes [provider]  multivitamin (RENA-VIT) TABS tablet Take 1 tablet by mouth daily after lunch.  03/31/19  Yes [provider]  isosorbide mononitrate (IMDUR)  30 MG 24 hr tablet Take 30 mg by mouth daily after lunch.     [provider]     Allergies:    No Known Allergies   Physical Exam:   Vitals  Blood pressure (!) 178/100, pulse (!) 114, temperature 98.4 F (36.9 C), resp. rate (!) 21, height 5\' 7"  (1.702 m), weight 63.5 kg, SpO2 96 %.  1.  General: Appears in no acute distress  2. Psychiatric: Alert, oriented x3, intact insight and judgment  3. Neurologic: Cranial nerves II to XII grossly intact, motor strength 5/5 in all extremities  4. HEENMT:  Atraumatic normocephalic, extraocular muscles intact  5. Respiratory : Clear to auscultation bilaterally  6. Cardiovascular : S1-S2, regular, no murmur auscultated  7. Gastrointestinal:  Abdomen is soft, nontender, no organomegaly      Data Review:    CBC Recent Labs  Lab 09/10/19 0015  WBC 10.5  HGB 12.3  HCT 41.0  PLT PLATELET CLUMPS NOTED ON SMEAR, COUNT APPEARS ADEQUATE  MCV 89.5  MCH 26.9  MCHC 30.0  RDW 17.7*  LYMPHSABS 0.9  MONOABS 0.4  EOSABS 0.1  BASOSABS 0.0   ------------------------------------------------------------------------------------------------------------------  Results for orders placed or performed during the hospital encounter of 09/09/19 (from the past 48 hour(s))  CBG monitoring, ED     Status: Abnormal   Collection Time: 09/09/19  9:03 PM  Result Value Ref Range   Glucose-Capillary 259 (H) 70 - 99 mg/dL  Comprehensive metabolic panel      Status: Abnormal   Collection Time: 09/10/19 12:15 AM  Result Value Ref Range   Sodium 139 135 - 145 mmol/L   Potassium 6.1 (H) 3.5 - 5.1 mmol/L   Chloride 102 98 - 111 mmol/L   CO2 21 (L) 22 - 32 mmol/L   Glucose, Bld 260 (H) 70 - 99 mg/dL   BUN 55 (H) 8 - 23 mg/dL   Creatinine, Ser 9.66 (H) 0.44 - 1.00 mg/dL   Calcium 8.8 (L) 8.9 - 10.3 mg/dL   Total Protein 8.7 (H) 6.5 - 8.1 g/dL   Albumin 3.1 (L) 3.5 - 5.0 g/dL   AST 26 15 - 41 U/L   ALT 27 0 - 44 U/L   Alkaline Phosphatase 106 38 - 126 U/L   Total Bilirubin 0.2 (L) 0.3 - 1.2 mg/dL   GFR calc non Af Amer 4 (L) >60 mL/min   GFR calc Af Amer 4 (L) >60 mL/min   Anion gap 16 (H) 5 - 15    Comment: Performed at [facility], [address], [place], [place] [address]  Lipase, blood     Status: None   Collection Time: 09/10/19 12:15 AM  Result Value Ref Range   Lipase 41 11 - 51 U/L    Comment: Performed at [facility], [address], [place], [place] [address]  CBC with Differential/Platelet     Status: Abnormal   Collection Time: 09/10/19 12:15 AM  Result Value Ref Range   WBC 10.5 4.0 - 10.5 K/uL   RBC 4.58 3.87 - 5.11 MIL/uL   Hemoglobin 12.3 12.0 - 15.0 g/dL   HCT 41.0 36.0 - 46.0 %   MCV 89.5 80.0 - 100.0 fL   MCH 26.9 26.0 - 34.0 pg   MCHC 30.0 30.0 - 36.0 g/dL   RDW 17.7 (H) 11.5 - 15.5 %   Platelets  150 - 400 K/uL    PLATELET CLUMPS NOTED ON SMEAR, COUNT APPEARS ADEQUATE   nRBC 0.0 0.0 -  0.2 %   Neutrophils Relative % 85 %   Neutro Abs 9.1 (H) 1.7 - 7.7 K/uL   Lymphocytes Relative 9 %   Lymphs Abs 0.9 0.7 - 4.0 K/uL   Monocytes Relative 4 %   Monocytes Absolute 0.4 0.1 - 1.0 K/uL   Eosinophils Relative 1 %   Eosinophils Absolute 0.1 0.0 - 0.5 K/uL   Basophils Relative 0 %   Basophils Absolute 0.0 0.0 - 0.1 K/uL   Immature Granulocytes 1 %   Abs Immature Granulocytes 0.05 0.00 - 0.07 K/uL    Comment: Performed at [facility], [address] [place], [place] [address]    Chemistries  Recent Labs  Lab  09/10/19 0015  NA 139  K 6.1*  CL 102  CO2 21*  GLUCOSE 260*  BUN 55*  CREATININE 9.66*  CALCIUM 8.8*  AST 26  ALT 27  ALKPHOS 106  BILITOT 0.2*   ------------------------------------------------------------------------------------------------------------------  ------------------------------------------------------------------------------------------------------------------ GFR: Estimated Creatinine Clearance: 5.3 mL/min (A) (by C-G formula based on SCr of 9.66 mg/dL (H)). Liver Function Tests: Recent Labs  Lab 09/10/19 0015  AST 26  ALT 27  ALKPHOS 106  BILITOT 0.2*  PROT 8.7*  ALBUMIN 3.1*   Recent Labs  Lab 09/10/19 0015  LIPASE 41   CBG: Recent Labs  Lab 09/09/19 2103  GLUCAP 259*    --------------------------------------------------------------------------------------------------------------- Urine analysis:    Component Value Date/Time   COLORURINE YELLOW 07/20/2019 2100   APPEARANCEUR CLEAR 07/20/2019 2100   LABSPEC 1.012 07/20/2019 2100   PHURINE 8.0 07/20/2019 2100   GLUCOSEU NEGATIVE 07/20/2019 2100   HGBUR SMALL (A) 07/20/2019 2100   BILIRUBINUR NEGATIVE 07/20/2019 2100   KETONESUR NEGATIVE 07/20/2019 2100   PROTEINUR >=300 (A) 07/20/2019 2100   NITRITE NEGATIVE 07/20/2019 2100   LEUKOCYTESUR NEGATIVE 07/20/2019 2100      Imaging Results:    Ct Abdomen Pelvis W Contrast  Result Date: 09/10/2019 CLINICAL DATA:  Acute abdominal pain. Nausea and vomiting. Missed dialysis yesterday. EXAM: CT ABDOMEN AND PELVIS WITH CONTRAST TECHNIQUE: Multidetector CT imaging of the abdomen and pelvis was performed using the standard protocol following bolus administration of intravenous contrast. CONTRAST:  115mL OMNIPAQUE IOHEXOL 300 MG/ML  SOLN COMPARISON:  CT 07/19/2019 FINDINGS: Lower chest: Cardiomegaly. Mitral annulus calcifications. Small pericardial effusion. Wall thickening of the distal esophagus. No pleural fluid. Hepatobiliary: No focal liver  abnormality is seen. No gallstones, gallbladder wall thickening, or biliary dilatation. Pancreas: No ductal dilatation or inflammation. Spleen: Normal in size without focal abnormality. Adrenals/Urinary Tract: No adrenal nodule. Bilateral renal parenchymal thinning. Small cysts as well as low-density lesions in both kidneys that are too small to accurately characterize. No hydronephrosis. Mild enhancement about the right renal pelvis, series 2, image 40. previous left proximal ureteral thickening has improved. Urinary bladder is partially distended with mild bladder wall thickening. Absent renal excretion on delayed phase imaging consistent with chronic renal disease. Stomach/Bowel: Small to moderate hiatal hernia with mild distal esophageal wall thickening. Stomach is otherwise decompressed which limits assessment. No small bowel obstruction or inflammatory change. Unchanged appearance of the pending suggest mildly dilated 8 mm, but no focal periappendiceal inflammation or fat stranding. Equivocal colonic thickening of the hepatic flexure, transverse, and descending colon versus nondistention. No definite pericolonic inflammation. Distal colonic diverticulosis without diverticulitis. Vascular/Lymphatic: Advanced aortic and branch atherosclerosis. No aneurysm. Patent portal vein. No enlarged lymph nodes in the abdomen or pelvis. Reproductive: Status post hysterectomy. No adnexal masses. Other: No free air, free fluid, or intra-abdominal fluid  collection. Musculoskeletal: Lucencies involving inferior endplate of L3 and superior endplate of L4 have slightly progressed from prior CT, however have been evaluated by MRI in the interim. IMPRESSION: 1. Equivocal colonic thickening/colitis of the hepatic flexure, transverse, and descending colon versus nondistention. 2. Small to moderate hiatal hernia with distal esophageal wall thickening, suggesting reflux or esophagitis. 3. Improved left ureteral thickening from prior  exam, however there is now enhancement to the right renal pelvis in addition to mild bladder wall thickening. Ascending urinary tract infection is considered. 4. Similar-appearing appendix which is mildly dilated and fluid-filled but without periappendiceal inflammation, likely reflecting patient's baseline. 5. Lucencies involving L3 and L4 have slightly progressed from prior CT, have been evaluated by MRI in the interim in felt to represent either large Schmorl's nodes or dialysis related embolized spondyloarthropathy. Aortic Atherosclerosis (ICD10-I70.0). Electronically Signed   By: Keith Rake M.D.   On: 09/10/2019 01:22   Dg Chest Port 1 View  Result Date: 09/09/2019 CLINICAL DATA:  68 year old female missed dialysis this week. Hyperglycemia and vomiting. EXAM: PORTABLE CHEST 1 VIEW COMPARISON:  Portable chest 07/25/2019 and earlier. FINDINGS: Portable AP upright view at 2247 hours. Larger lung volumes. Stable right chest tunneled type dual lumen dialysis catheter. Stable cardiac size and mediastinal contours. Cardiac size at the upper limits of normal. Visualized tracheal air column is within normal limits. Allowing for portable technique the lungs are clear. Paucity of bowel gas in the upper abdomen. No acute osseous abnormality identified. IMPRESSION: Improved lung volumes.  No acute cardiopulmonary abnormality. Electronically Signed   By: Genevie Ann M.D.   On: 09/09/2019 22:55    My personal review of EKG: Rhythm NSR, no ST changes.   Assessment & Plan:    Active Problems:   Hyperkalemia   1. Nausea vomiting-started after eating [place] food at home.  CT abdomen and pelvis showed equivocal colonic wall thickening, patient has completed 6 weeks of Fortaz for Pseudomonas bacteremia.  Would avoid giving any further antibiotics.  If patient starts having diarrhea, C. difficile PCR has been ordered.  Continue Zofran as needed for nausea and vomiting.  Vomiting has resolved at this time.  Will  withhold IV fluids, patient to undergo dialysis in a.m.  2. Diabetes mellitus type 2-we will start sliding scale insulin with NovoLog.  3. Hyperkalemia-potassium is 6.1, patient received calcium gluconate in the ED.  Also 1 dose of Lokelma given.  Plan for dialysis in a.m.  Nephrology was consulted by ED physician.  4. Recent Pseudomonas bacteremia-treated with Tressie Ellis for 6 weeks, completed treatment on 09/04/2019.  5. ESRD on hemodialysis-Monday Wednesday Friday.  Missed her dialysis on Friday as it was her birthday.  Dialysis planned for tomorrow morning.    DVT Prophylaxis-Heparin  AM Labs Ordered, also please review Full Orders  Family Communication: Admission, patients condition and plan of care including tests being ordered have been discussed with the patient who indicate understanding and agree with the plan and Code Status.  Code Status: Full code  Admission status: Observation: Based on patients clinical presentation and evaluation of above clinical data, I have made determination that patient will need less than 2 midnight stay in the hospital.  Time spent in minutes : 60 minutes   Oswald Hillock M.D on 09/10/2019 at 3:42 AM

## 2019-09-10 NOTE — Consult Note (Addendum)
[facility] Renal Consultation Note    Indication for Consultation:  Management of ESRD/hemodialysis; anemia, hypertension/volume and secondary hyperparathyroidism  HPI: [name] is a 68 y.o. female.  [name] has a PMH sig for DM, HTN, CVA, and ESRD on HD every MWF but missed HD on Friday because it was her birthday.  She did well until yesterday afternoon when she started having some N/V/D associated with crampy abdominal pain.  She presented to [facility] ED and labs revealed K of 6.1.  She was given lokelma and IV calcium and admitted for observation for presumed gastroenteritis.  Today her K was 6.4 and we were consulted to provide dialysis and manage her ESRD related disease processes.  Past Medical History:  Diagnosis Date  . Arthritis    hands  . Constipation   . Diabetes mellitus without complication (HCC)    Type II  . ESRD (end stage renal disease) ([place])     M/W/F- Hemodialysis  . GERD (gastroesophageal reflux disease)   . Headache   . Hyperlipidemia   . Hypertension   . Irregular heart rate   . Stroke [facility])    TIA - approx 2010- no residual   Past Surgical History:  Procedure Laterality Date  . ABDOMINAL HYSTERECTOMY    . AORTIC ARCH ANGIOGRAPHY N/A 03/28/2019   Procedure: AORTIC ARCH ANGIOGRAPHY;  Surgeon: Waynetta Sandy, MD;  Location: [place] [facility];  Service: Cardiovascular;  Laterality: N/A;  . AV FISTULA PLACEMENT Left 06/13/2018   Procedure: ARTERIOVENOUS (AV) FISTULA CREATION;  Surgeon: Rosetta Posner, MD;  Location: [place];  Service: Vascular;  Laterality: Left;  . AV FISTULA PLACEMENT Left 08/18/2018   Procedure: INSERTION OF 4-7MM X 45CM ARTERIOVENOUS (AV) GORE-TEX GRAFT LEFT  FOREARM;  Surgeon: Rosetta Posner, MD;  Location: [place];  Service: Vascular;  Laterality: Left;  . AV FISTULA PLACEMENT Right 04/06/2019   Procedure: INSERTION OF ARTERIOVENOUS (AV) GORE-TEX GRAFT RIGHT UPPER ARM;  Surgeon: Waynetta Sandy, MD;   Location: [place];  Service: Vascular;  Laterality: Right;  . AV FISTULA PLACEMENT Right 04/13/2019   Procedure: INSERTION OF ARTERIOVENOUS (AV) BOVINE  ARTEGRAFT GRAFT RIGHT UPPER EXTREMITY;  Surgeon: Serafina Mitchell, MD;  Location: [place];  Service: Vascular;  Laterality: Right;  . [place] REMOVAL Right 05/16/2019   Procedure: REMOVAL OF ARTERIOVENOUS GORETEX GRAFT ([place]) RIGHT ARM;  Surgeon: Angelia Mould, MD;  Location: [place];  Service: Vascular;  Laterality: Right;  . BASCILIC VEIN TRANSPOSITION Right 03/07/2019   Procedure: First Stage Bascilic Vein Transposition Right Arm;  Surgeon: Serafina Mitchell, MD;  Location: [place];  Service: Vascular;  Laterality: Right;  . CATARACT EXTRACTION Right 2005  . INSERTION OF DIALYSIS CATHETER N/A 08/18/2018   Procedure: INSERTION OF DIALYSIS CATHETER;  Surgeon: Rosetta Posner, MD;  Location: [place];  Service: Vascular;  Laterality: N/A;  . INSERTION OF DIALYSIS CATHETER Right 07/25/2019   Procedure: INSERTION OF DIALYSIS CATHETER RIGHT INTERNAL JUGULAR (TUNNELED);  Surgeon: Virl Cagey, MD;  Location: [facility];  Service: General;  Laterality: Right;  . IR FLUORO GUIDE CV LINE RIGHT  12/06/2018  . IR PTA ADDL CENTRAL DIALYSIS SEG THRU DIALY CIRCUIT RIGHT Right 12/06/2018  . THROMBECTOMY AND REVISION OF ARTERIOVENTOUS (AV) GORETEX  GRAFT Left 09/29/2018   Procedure: INSERTION OF ARTERIOVENTOUS (AV) GORETEX  GRAFT ARM;  Surgeon: Waynetta Sandy, MD;  Location: [place];  Service: Vascular;  Laterality: Left;  . UPPER EXTREMITY ANGIOGRAPHY Right 03/28/2019  Procedure: UPPER EXTREMITY ANGIOGRAPHY;  Surgeon: Waynetta Sandy, MD;  Location: [place] [facility];  Service: Cardiovascular;  Laterality: Right;  . VENOGRAM  10/27/2018   Procedure: Venogram;  Surgeon: Marty Heck, MD;  Location: [place] [facility];  Service: Cardiovascular;;  bilateral arm   Family History:   Family History  Problem Relation Age of Onset  . Heart murmur  Mother   . Heart failure Mother   . Diabetes Mother   . Cirrhosis Father   . Alcoholism Father    Social History:  reports that she has never smoked. She has never used smokeless tobacco. She reports that she does not drink alcohol or use drugs. No Known Allergies Prior to Admission medications   Medication Sig Start Date End Date Taking? Authorizing Provider  amLODipine (NORVASC) 10 MG tablet Take 10 mg by mouth daily after lunch.    Yes [provider]  aspirin EC 81 MG tablet Take 81 mg by mouth daily after lunch.    Yes [provider]  calcium carbonate (TUMS - DOSED IN MG ELEMENTAL CALCIUM) 500 MG chewable tablet Chew 1 tablet by mouth 2 (two) times daily. With lunch & supper   Yes [provider]  carvedilol (COREG) 6.25 MG tablet Take 6.25 mg by mouth 2 (two) times daily. 02/01/19  Yes [provider]  clopidogrel (PLAVIX) 75 MG tablet Take 1 tablet (75 mg total) by mouth daily. 04/13/19  Yes Serafina Mitchell, MD  fluticasone (FLONASE) 50 MCG/ACT nasal spray Place 1 spray into both nostrils daily as needed for allergies or rhinitis.    Yes [provider]  insulin NPH-regular Human (NOVOLIN 70/30) (70-30) 100 UNIT/ML injection Inject 10-20 Units into the skin See admin instructions. Use twice daily per sliding scale: Blood sugar less than 150=12 units & Blood sugar 200 or more=20 units   Yes [provider]  multivitamin (RENA-VIT) TABS tablet Take 1 tablet by mouth daily after lunch.  03/31/19  Yes [provider]  isosorbide mononitrate (IMDUR) 30 MG 24 hr tablet Take 30 mg by mouth daily after lunch.     [provider]   Current Facility-Administered Medications  Medication Dose Route Frequency Provider Last Rate Last Dose  . 0.9 %  sodium chloride infusion  250 mL Intravenous PRN Oswald Hillock, MD      . acetaminophen (TYLENOL) tablet 650 mg  650 mg Oral Q6H PRN Oswald Hillock, MD       Or  . acetaminophen  (TYLENOL) suppository 650 mg  650 mg Rectal Q6H PRN Oswald Hillock, MD      . amLODipine (NORVASC) tablet 10 mg  10 mg Oral QPC lunch Oswald Hillock, MD      . aspirin EC tablet 81 mg  81 mg Oral QPC lunch [place], Marge Duncans, MD      . carvedilol (COREG) tablet 6.25 mg  6.25 mg Oral BID Oswald Hillock, MD   6.25 mg at 09/10/19 [id number]  . Chlorhexidine Gluconate Cloth 2 % PADS 6 each  6 each Topical Daily Oswald Hillock, MD   6 each at 09/10/19 [phone]  . Chlorhexidine Gluconate Cloth 2 % PADS 6 each  6 each Topical Q0600 Loyd Salvador, Broadus John, MD      . Chlorhexidine Gluconate Cloth 2 % PADS 6 each  6 each Topical Q0600 Donato Heinz, MD      . clopidogrel (PLAVIX) tablet 75 mg  75 mg Oral Daily [place],  Marge Duncans, MD   75 mg at 09/10/19 0901  . fluticasone (FLONASE) 50 MCG/ACT nasal spray 1 spray  1 spray Each Nare Daily PRN Oswald Hillock, MD      . heparin injection 5,000 Units  5,000 Units Subcutaneous Q8H Oswald Hillock, MD   5,000 Units at 09/10/19 0511  . insulin aspart (novoLOG) injection 0-9 Units  0-9 Units Subcutaneous TID WC Oswald Hillock, MD   1 Units at 09/10/19 0900  . labetalol (NORMODYNE) injection 10 mg  10 mg Intravenous Q2H PRN Johnson, Clanford L, MD      . ondansetron (ZOFRAN) tablet 4 mg  4 mg Oral Q6H PRN Oswald Hillock, MD       Or  . ondansetron (ZOFRAN) injection 4 mg  4 mg Intravenous Q6H PRN Darrick Meigs, Marge Duncans, MD      . sodium chloride flush (NS) 0.9 % injection 3 mL  3 mL Intravenous Q12H Oswald Hillock, MD   3 mL at 09/10/19 0901  . sodium chloride flush (NS) 0.9 % injection 3 mL  3 mL Intravenous PRN Darrick Meigs Marge Duncans, MD       Facility-Administered Medications Ordered in Other Encounters  Medication Dose Route Frequency Provider Last Rate Last Dose  . 0.9 %  sodium chloride infusion   Intravenous Continuous Monia Sabal, PA-C       Labs: Basic Metabolic Panel: Recent Labs  Lab 09/10/19 0015 09/10/19 0705  NA 139 139  K 6.1* 6.4*  CL 102 102  CO2 21* 23  GLUCOSE 260* 142*  BUN  55* 58*  CREATININE 9.66* 9.95*  CALCIUM 8.8* 8.7*   Liver Function Tests: Recent Labs  Lab 09/10/19 0015 09/10/19 0705  AST 26 17  ALT 27 21  ALKPHOS 106 92  BILITOT 0.2* 0.3  PROT 8.7* 7.5  ALBUMIN 3.1* 2.9*   Recent Labs  Lab 09/10/19 0015  LIPASE 41   No results for input(s): AMMONIA in the last 168 hours. CBC: Recent Labs  Lab 09/10/19 0015 09/10/19 0705  WBC 10.5 7.5  NEUTROABS 9.1*  --   HGB 12.3 11.8*  HCT 41.0 39.0  MCV 89.5 90.3  PLT PLATELET CLUMPS NOTED ON SMEAR, COUNT APPEARS ADEQUATE 237   Cardiac Enzymes: No results for input(s): CKTOTAL, CKMB, CKMBINDEX, TROPONINI in the last 168 hours. CBG: Recent Labs  Lab 09/09/19 2103 09/10/19 0741 09/10/19 1118  GLUCAP 259* 129* 74   Iron Studies: No results for input(s): IRON, TIBC, TRANSFERRIN, FERRITIN in the last 72 hours. Studies/Results: Ct Abdomen Pelvis W Contrast  Result Date: 09/10/2019 CLINICAL DATA:  Acute abdominal pain. Nausea and vomiting. Missed dialysis yesterday. EXAM: CT ABDOMEN AND PELVIS WITH CONTRAST TECHNIQUE: Multidetector CT imaging of the abdomen and pelvis was performed using the standard protocol following bolus administration of intravenous contrast. CONTRAST:  171mL OMNIPAQUE IOHEXOL 300 MG/ML  SOLN COMPARISON:  CT 07/19/2019 FINDINGS: Lower chest: Cardiomegaly. Mitral annulus calcifications. Small pericardial effusion. Wall thickening of the distal esophagus. No pleural fluid. Hepatobiliary: No focal liver abnormality is seen. No gallstones, gallbladder wall thickening, or biliary dilatation. Pancreas: No ductal dilatation or inflammation. Spleen: Normal in size without focal abnormality. Adrenals/Urinary Tract: No adrenal nodule. Bilateral renal parenchymal thinning. Small cysts as well as low-density lesions in both kidneys that are too small to accurately characterize. No hydronephrosis. Mild enhancement about the right renal pelvis, series 2, image 40. previous left proximal  ureteral thickening has improved. Urinary bladder is partially distended with mild bladder  wall thickening. Absent renal excretion on delayed phase imaging consistent with chronic renal disease. Stomach/Bowel: Small to moderate hiatal hernia with mild distal esophageal wall thickening. Stomach is otherwise decompressed which limits assessment. No small bowel obstruction or inflammatory change. Unchanged appearance of the pending suggest mildly dilated 8 mm, but no focal periappendiceal inflammation or fat stranding. Equivocal colonic thickening of the hepatic flexure, transverse, and descending colon versus nondistention. No definite pericolonic inflammation. Distal colonic diverticulosis without diverticulitis. Vascular/Lymphatic: Advanced aortic and branch atherosclerosis. No aneurysm. Patent portal vein. No enlarged lymph nodes in the abdomen or pelvis. Reproductive: Status post hysterectomy. No adnexal masses. Other: No free air, free fluid, or intra-abdominal fluid collection. Musculoskeletal: Lucencies involving inferior endplate of L3 and superior endplate of L4 have slightly progressed from prior CT, however have been evaluated by MRI in the interim. IMPRESSION: 1. Equivocal colonic thickening/colitis of the hepatic flexure, transverse, and descending colon versus nondistention. 2. Small to moderate hiatal hernia with distal esophageal wall thickening, suggesting reflux or esophagitis. 3. Improved left ureteral thickening from prior exam, however there is now enhancement to the right renal pelvis in addition to mild bladder wall thickening. Ascending urinary tract infection is considered. 4. Similar-appearing appendix which is mildly dilated and fluid-filled but without periappendiceal inflammation, likely reflecting patient's baseline. 5. Lucencies involving L3 and L4 have slightly progressed from prior CT, have been evaluated by MRI in the interim in felt to represent either large Schmorl's nodes or  dialysis related embolized spondyloarthropathy. Aortic Atherosclerosis (ICD10-I70.0). Electronically Signed   By: Keith Rake M.D.   On: 09/10/2019 01:22   Dg Chest Port 1 View  Result Date: 09/09/2019 CLINICAL DATA:  68 year old female missed dialysis this week. Hyperglycemia and vomiting. EXAM: PORTABLE CHEST 1 VIEW COMPARISON:  Portable chest 07/25/2019 and earlier. FINDINGS: Portable AP upright view at 2247 hours. Larger lung volumes. Stable right chest tunneled type dual lumen dialysis catheter. Stable cardiac size and mediastinal contours. Cardiac size at the upper limits of normal. Visualized tracheal air column is within normal limits. Allowing for portable technique the lungs are clear. Paucity of bowel gas in the upper abdomen. No acute osseous abnormality identified. IMPRESSION: Improved lung volumes.  No acute cardiopulmonary abnormality. Electronically Signed   By: Genevie Ann M.D.   On: 09/09/2019 22:55    ROS: Pertinent items are noted in HPI. Physical Exam: Vitals:   09/10/19 0445 09/10/19 0508 09/10/19 0556 09/10/19 0807  BP: (!) 179/105 (!) 197/112 (!) 180/97   Pulse: (!) 110 (!) 111 (!) 116   Resp:  18 18   Temp:  99.2 F (37.3 C)    TempSrc:  Oral    SpO2: 98% 92% 93% 94%  Weight:  65.7 kg    Height:  5\' 7"  (1.702 m)        Weight change:   Intake/Output Summary (Last 24 hours) at 09/10/2019 1140 Last data filed at 09/10/2019 0438 Gross per 24 hour  Intake 43.98 ml  Output -  Net 43.98 ml   BP (!) 180/97 (BP Location: Right Arm)   Pulse (!) 116   Temp 99.2 F (37.3 C) (Oral)   Resp 18   Ht 5\' 7"  (1.702 m)   Wt 65.7 kg   SpO2 94%   BMI 22.69 kg/m  General appearance: fatigued and no distress Head: Normocephalic, without obvious abnormality, atraumatic Resp: clear to auscultation bilaterally Cardio: regular rate and rhythm, S1, S2 normal, no murmur, click, rub or gallop GI: soft, non-tender; bowel sounds  normal; no masses,  no  organomegaly Extremities: extremities normal, atraumatic, no cyanosis or edema Dialysis Access: [facility]  Dialysis Orders: Center: [place]  on MWF . EDW 64 kg Time 3.5 hrs. Access R[facility]  Assessment/Plan: 1.  Hyperkalemia- due to noncompliance with HD.  Will plan for HD today to help correct potassium.  Will follow K level tomorrow if she remains an inpatient.  2.  ESRD -  Stressed compliance with HD 3.  Hypertension/volume  - bp elevated, only slightly above edw.  Will challenge and follow 4.  Anemia  -  stable 5.  Metabolic bone disease -   Check labs and continue with outpatient meds 6.  Nutrition - renal diet 7. N/V/D- per primary svc.  She did have a history of pseudomonas bacteremia from diskitis +/- HD catheter in September and had last dose of abx on 09/04/19.  Will check blood cultures on HD and see if she needs longer abx course  Donetta Potts, MD [place] Pager [phone] 09/10/2019, 11:40 AM

## 2019-09-10 NOTE — Procedures (Addendum)
    HEMODIALYSIS TREATMENT NOTE:  Pt lethargic pre-treatment but responsive to voice and able to follow all commands.  She slept but was arouseable during the first two hours of dialysis. She was able to respond "I'm okay" to queries regarding her tolerance of ultrafiltration. UF rate was adjusted and interrupted in response to declining BP.  Lowest BP 116/57.    At 1604, she was awake but restless and fidgety, turning in ways that occluded catheter flow.  She was unable to follow simple requests and she was non-verbal.  BP 150/102, glucose 109.  Able to move all extremities, but not able to fully participate in neurological assessment. Primary nurse confirmed that patient had been more alert and responsive this morning.  Rapid response nurses and Dr. Wynetta Emery came to patient's bedside.  A code stroke was called at 60, dialysis was stopped/all blood was returned, and patient was transported for stat head CT.  Dr. Marval Regal was notified.   Patient's son arrived as patient was returning from CT and was informed of treatment events.  Patient was more alert but drowsy.        Total run time: 2 hours, 45 minutes with first 30 minutes on low-K bath. Net UF: 978 cc Rockwell Alexandria, RN

## 2019-09-10 NOTE — Significant Event (Signed)
Rapid Response Event Note  Overview:  Called to room by primary nurse to evaluate pt change of mental status.      Initial Focused Assessment: Pt very drowsy, unable to follow commands, answers questions but slow to respond, pupils equal, 3 in diameter, sluggish. CBG 109. Pt receiving dialysis which had started at 1345.   Per dialysis RN, pt had answered appropriately, but did seem sleepy at start of therapy. RN reported pt elevated bo at start of dialysis and during therapy bp did decrease.     Interventions: MD notified to come to bedside. Code stroke called. Pt transported to [place] (if not transferred): Neurochecks ordered by MD.  Event Summary:   at      at          [facility]

## 2019-09-10 NOTE — Significant Event (Signed)
09/10/2019 5:14 PM  Rapid Response Event   Called to see patient due to concerns of acute mental status changes during the last hour of HD session.  Pt was able to complete about 2.5 hours of HD treatment.  She progressively became more lethargic during her session today.  She became more lethargic and would not follow commands.   I found her at the bedside to be lethargic but responsive.  Her speech was normal and her neuro exam was unchanged from this morning when I examined her at bedside.  She was moving all extremities.  She was intermittently following commands.  Code stroke was called and CT brain without acute findings.  Will continue to monitor her and add neuro checks to treatment plan. I suspect she is having metabolic encephalopathy from missing HD treatments and needs time to get back to her baseline.  Follow closely. Aspiration precautions.    Critical care time spent 35 minutes   Murvin Natal MD  How to contact the [facility] Attending or Consulting provider [place] or covering provider during after hours [place], for this patient?  1. Check the care team in [facility] and look for a) attending/consulting TR[facility] provider listed and b) the [facility] team listed 2. Log into [email or web address] and use [place]'s universal password to access. If you do not have the password, please contact the hospital operator. 3. Locate the [facility] provider you are looking for under [facility] and page to a number that you can be directly reached. 4. If you still have difficulty reaching the provider, please page the [facility] (Director on Call) for the Hospitalists listed on amion for assistance.

## 2019-09-11 LAB — RENAL FUNCTION PANEL
Albumin: 2.7 g/dL — ABNORMAL LOW (ref 3.5–5.0)
Anion gap: 17 — ABNORMAL HIGH (ref 5–15)
BUN: 29 mg/dL — ABNORMAL HIGH (ref 8–23)
CO2: 26 mmol/L (ref 22–32)
Calcium: 8.6 mg/dL — ABNORMAL LOW (ref 8.9–10.3)
Chloride: 95 mmol/L — ABNORMAL LOW (ref 98–111)
Creatinine, Ser: 7.15 mg/dL — ABNORMAL HIGH (ref 0.44–1.00)
GFR calc Af Amer: 6 mL/min — ABNORMAL LOW (ref >60)
GFR calc non Af Amer: 5 mL/min — ABNORMAL LOW (ref >60)
Glucose, Bld: 90 mg/dL (ref 70–99)
Phosphorus: 5.4 mg/dL — ABNORMAL HIGH (ref 2.5–4.6)
Potassium: 5.4 mmol/L — ABNORMAL HIGH (ref 3.5–5.1)
Sodium: 138 mmol/L (ref 135–145)

## 2019-09-11 LAB — GLUCOSE, CAPILLARY
Glucose-Capillary: 97 mg/dL (ref 70–99)
Glucose-Capillary: 98 mg/dL (ref 70–99)
Glucose-Capillary: 99 mg/dL (ref 70–99)
Glucose-Capillary: 90 mg/dL (ref 70–99)
Glucose-Capillary: 94 mg/dL (ref 70–99)

## 2019-09-11 LAB — HEMOGLOBIN A1C
Hgb A1c MFr Bld: 6.7 % — ABNORMAL HIGH (ref 4.8–5.6)
Mean Plasma Glucose: 146 mg/dL

## 2019-09-11 MED ORDER — ALTEPLASE 2 MG IJ SOLR: 2.0000 mg | Freq: Once | INTRAMUSCULAR | Status: DC | PRN

## 2019-09-11 MED ORDER — CHLORHEXIDINE GLUCONATE CLOTH 2 % EX PADS
6.0000 | MEDICATED_PAD | Freq: Every day | CUTANEOUS | Status: DC
  Administered 2019-09-11: 6 via TOPICAL

## 2019-09-11 NOTE — Procedures (Signed)
    HEMODIALYSIS TREATMENT NOTE:  3 hour heparin-free dialysis completed via right IJ tunneled catheter.  Unable to tolerate UF due to unstable blood pressures.  Had syncopal episode yesterday with HD when SBP decreased to 116.  BP high at start of treatment (160/108) steadily declined then dropped from 154/98 to 129/76 in 15 minutes with a gentle 1 liter goal.  UF was stopped and BP remained soft until last 30 minutes of treatment.  Net UF 166cc.  All blood was returned.  Rockwell Alexandria, RN

## 2019-09-11 NOTE — Progress Notes (Signed)
[facility] ROUNDING NOTE   Subjective:   End-stage renal disease 68 year old lady with history of heart diabetes hypertension CVA she is generally Monday Wednesday Friday dialysis although did receive the treatment 09/10/2019 for hyperkalemia and a potassium of 6.1.  During her dialysis treatment there was an acute mental status change where she became increasingly lethargic hemodialysis was terminated code stroke called CT scan showed no acute findings.  Blood pressure 142/54 pulse 89 temperature 99.4 O2 sats 96% room air  Sodium 138 potassium 5.4 chloride 95 CO2 26 BUN 29 creatinine 7.15 glucose 90 calcium 8.6 phosphorus 5.4 albumin 2.7  Dialysis 09/10/2019 with 878 cc removed   Amlodipine 10 mg daily aspirin 81 mg daily Coreg 6.25 mg twice daily    Objective:  Vital signs in last 24 hours:  Temp:  [98.9 F (37.2 C)-99.4 F (37.4 C)] 99.4 F (37.4 C) (10/19 0409) Pulse Rate:  [68-112] 89 (10/19 0409) Resp:  [16-20] 20 (10/19 0409) BP: (98-200)/(54-107) 142/54 (10/19 0409) SpO2:  [95 %-99 %] 96 % (10/19 0747) Weight:  [65.8 kg] 65.8 kg (10/18 1330)  Weight change: 2.296 kg Filed Weights   09/09/19 2054 09/10/19 0508 09/10/19 1330  Weight: 63.5 kg 65.7 kg 65.8 kg    Intake/Output: I/O last 3 completed shifts: In: 67 [IV Piggyback:44] Out: 878 [Other:878]   Intake/Output this shift:  Total I/O In: 240 [P.O.:240] Out: -   General appearance: fatigued and no distress Head: Normocephalic, without obvious abnormality, atraumatic Resp: clear to auscultation bilaterally Cardio: regular rate and rhythm, S1, S2 normal, no murmur, click, rub or gallop GI: soft, non-tender; bowel sounds normal; no masses,  no organomegaly Extremities: extremities normal, atraumatic, no cyanosis or edema Dialysis Access: [facility]   Basic Metabolic Panel: Recent Labs  Lab 09/10/19 0015 09/10/19 0705 09/11/19 0608  NA 139 139 138  K 6.1* 6.4* 5.4*  CL 102 102 95*  CO2  21* 23 26  GLUCOSE 260* 142* 90  BUN 55* 58* 29*  CREATININE 9.66* 9.95* 7.15*  CALCIUM 8.8* 8.7* 8.6*  PHOS  --   --  5.4*    Liver Function Tests: Recent Labs  Lab 09/10/19 0015 09/10/19 0705 09/11/19 0608  AST 26 17  --   ALT 27 21  --   ALKPHOS 106 92  --   BILITOT 0.2* 0.3  --   PROT 8.7* 7.5  --   ALBUMIN 3.1* 2.9* 2.7*   Recent Labs  Lab 09/10/19 0015  LIPASE 41   No results for input(s): AMMONIA in the last 168 hours.  CBC: Recent Labs  Lab 09/10/19 0015 09/10/19 0705  WBC 10.5 7.5  NEUTROABS 9.1*  --   HGB 12.3 11.8*  HCT 41.0 39.0  MCV 89.5 90.3  PLT PLATELET CLUMPS NOTED ON SMEAR, COUNT APPEARS ADEQUATE 237    Cardiac Enzymes: No results for input(s): CKTOTAL, CKMB, CKMBINDEX, TROPONINI in the last 168 hours.  BNP: Invalid input(s): POCBNP  CBG: Recent Labs  Lab 09/10/19 1118 09/10/19 1615 09/10/19 2109 09/11/19 0315 09/11/19 0724  GLUCAP 74 109* 122* 97 98    Microbiology: Results for orders placed or performed during the hospital encounter of 09/09/19  SARS CORONAVIRUS 2 (TAT 6-24 HRS) Nasopharyngeal Nasopharyngeal Swab     Status: None   Collection Time: 09/10/19 12:17 AM   Specimen: Nasopharyngeal Swab  Result Value Ref Range Status   SARS Coronavirus 2 NEGATIVE NEGATIVE Final    Comment: (NOTE) SARS-CoV-2 target nucleic acids are NOT DETECTED.  The SARS-CoV-2 RNA is generally detectable in upper and lower respiratory specimens during the acute phase of infection. Negative results do not preclude SARS-CoV-2 infection, do not rule out co-infections with other pathogens, and should not be used as the sole basis for treatment or other patient management decisions. Negative results must be combined with clinical observations, patient history, and epidemiological information. The expected result is Negative. Fact Sheet for Patients: SugarRoll.be Fact Sheet for Healthcare  Providers: https://www.woods-mathews.com/ This test is not yet approved or cleared by the [place] FDA and  has been authorized for detection and/or diagnosis of SARS-CoV-2 by FDA under an Emergency Use Authorization (EUA). This EUA will remain  in effect (meaning this test can be used) for the duration of the COVID-19 declaration under Section 56 4(b)(1) of the Act, 21 U.S.C. section 360bbb-3(b)(1), unless the authorization is terminated or revoked sooner. Performed at [facility], [place] [address], [place], [place] [address]   MRSA PCR Screening     Status: None   Collection Time: 09/10/19  5:25 AM   Specimen: Nasopharyngeal  Result Value Ref Range Status   MRSA by PCR NEGATIVE NEGATIVE Final    Comment:        The GeneXpert MRSA Assay (FDA approved for NASAL specimens only), is one component of a comprehensive MRSA colonization surveillance program. It is not intended to diagnose MRSA infection nor to guide or monitor treatment for MRSA infections. Performed at [facility], [address] [place], [place] [address]   Culture, blood (Routine X 2) w Reflex to ID Panel     Status: None (Preliminary result)   Collection Time: 09/10/19  1:30 PM   Specimen: BLOOD  Result Value Ref Range Status   Specimen Description BLOOD  Final   Special Requests NONE  Final   Culture   Final    NO GROWTH < 24 HOURS Performed at [facility], [address] [place], [place] [address]    Report Status PENDING  Incomplete  Culture, blood (Routine X 2) w Reflex to ID Panel     Status: None (Preliminary result)   Collection Time: 09/10/19  1:50 PM   Specimen: BLOOD  Result Value Ref Range Status   Specimen Description BLOOD  Final   Special Requests NONE  Final   Culture   Final    NO GROWTH < 24 HOURS Performed at [facility], [address], [place], [place] [address]    Report Status PENDING  Incomplete    Coagulation Studies: No results for input(s): LABPROT,  INR in the last 72 hours.  Urinalysis: No results for input(s): COLORURINE, LABSPEC, PHURINE, GLUCOSEU, HGBUR, BILIRUBINUR, KETONESUR, PROTEINUR, UROBILINOGEN, NITRITE, LEUKOCYTESUR in the last 72 hours.  Invalid input(s): APPERANCEUR    Imaging: Ct Abdomen Pelvis W Contrast  Result Date: 09/10/2019 CLINICAL DATA:  Acute abdominal pain. Nausea and vomiting. Missed dialysis yesterday. EXAM: CT ABDOMEN AND PELVIS WITH CONTRAST TECHNIQUE: Multidetector CT imaging of the abdomen and pelvis was performed using the standard protocol following bolus administration of intravenous contrast. CONTRAST:  181mL OMNIPAQUE IOHEXOL 300 MG/ML  SOLN COMPARISON:  CT 07/19/2019 FINDINGS: Lower chest: Cardiomegaly. Mitral annulus calcifications. Small pericardial effusion. Wall thickening of the distal esophagus. No pleural fluid. Hepatobiliary: No focal liver abnormality is seen. No gallstones, gallbladder wall thickening, or biliary dilatation. Pancreas: No ductal dilatation or inflammation. Spleen: Normal in size without focal abnormality. Adrenals/Urinary Tract: No adrenal nodule. Bilateral renal parenchymal thinning. Small cysts as well as low-density lesions  in both kidneys that are too small to accurately characterize. No hydronephrosis. Mild enhancement about the right renal pelvis, series 2, image 40. previous left proximal ureteral thickening has improved. Urinary bladder is partially distended with mild bladder wall thickening. Absent renal excretion on delayed phase imaging consistent with chronic renal disease. Stomach/Bowel: Small to moderate hiatal hernia with mild distal esophageal wall thickening. Stomach is otherwise decompressed which limits assessment. No small bowel obstruction or inflammatory change. Unchanged appearance of the pending suggest mildly dilated 8 mm, but no focal periappendiceal inflammation or fat stranding. Equivocal colonic thickening of the hepatic flexure, transverse, and descending  colon versus nondistention. No definite pericolonic inflammation. Distal colonic diverticulosis without diverticulitis. Vascular/Lymphatic: Advanced aortic and branch atherosclerosis. No aneurysm. Patent portal vein. No enlarged lymph nodes in the abdomen or pelvis. Reproductive: Status post hysterectomy. No adnexal masses. Other: No free air, free fluid, or intra-abdominal fluid collection. Musculoskeletal: Lucencies involving inferior endplate of L3 and superior endplate of L4 have slightly progressed from prior CT, however have been evaluated by MRI in the interim. IMPRESSION: 1. Equivocal colonic thickening/colitis of the hepatic flexure, transverse, and descending colon versus nondistention. 2. Small to moderate hiatal hernia with distal esophageal wall thickening, suggesting reflux or esophagitis. 3. Improved left ureteral thickening from prior exam, however there is now enhancement to the right renal pelvis in addition to mild bladder wall thickening. Ascending urinary tract infection is considered. 4. Similar-appearing appendix which is mildly dilated and fluid-filled but without periappendiceal inflammation, likely reflecting patient's baseline. 5. Lucencies involving L3 and L4 have slightly progressed from prior CT, have been evaluated by MRI in the interim in felt to represent either large Schmorl's nodes or dialysis related embolized spondyloarthropathy. Aortic Atherosclerosis (ICD10-I70.0). Electronically Signed   By: Keith Rake M.D.   On: 09/10/2019 01:22   Dg Chest Port 1 View  Result Date: 09/09/2019 CLINICAL DATA:  68 year old female missed dialysis this week. Hyperglycemia and vomiting. EXAM: PORTABLE CHEST 1 VIEW COMPARISON:  Portable chest 07/25/2019 and earlier. FINDINGS: Portable AP upright view at 2247 hours. Larger lung volumes. Stable right chest tunneled type dual lumen dialysis catheter. Stable cardiac size and mediastinal contours. Cardiac size at the upper limits of normal.  Visualized tracheal air column is within normal limits. Allowing for portable technique the lungs are clear. Paucity of bowel gas in the upper abdomen. No acute osseous abnormality identified. IMPRESSION: Improved lung volumes.  No acute cardiopulmonary abnormality. Electronically Signed   By: Genevie Ann M.D.   On: 09/09/2019 22:55   Ct Head Code Stroke Wo Contrast  Result Date: 09/10/2019 CLINICAL DATA:  Code stroke. Acute onset of confusion, lethargy, and not responding to commands. Hyperkalemia. EXAM: CT HEAD WITHOUT CONTRAST TECHNIQUE: Contiguous axial images were obtained from the base of the skull through the vertex without intravenous contrast. COMPARISON:  CT head without contrast 07/19/2019 FINDINGS: Brain: Diffuse periventricular and subcortical white matter disease is stable. No acute infarct, hemorrhage, or mass lesion is present. Remote infarct of the left thalamus is stable the brainstem and cerebellum are within normal limits. The ventricles are of normal size. No significant extraaxial fluid collection is present. Vascular: Atherosclerotic changes are present within the cavernous internal carotid arteries bilaterally. There is no hyperdense vessel. Skull: Calvarium is intact. No focal lytic or blastic lesions are present. Sinuses/Orbits: The paranasal sinuses and mastoid air cells are clear. Right lens replacement is present. Globes and orbits are otherwise within normal limits. ASPECTS [facility] Stroke Program Early CT Score) -  Ganglionic level infarction (caudate, lentiform nuclei, internal capsule, insula, M1-M3 cortex): 7/7 - Supraganglionic infarction (M4-M6 cortex): 3/3 Total score (0-10 with 10 being normal): 10/10 IMPRESSION: 1. Stable atrophy and diffuse white matter disease. This likely reflects the sequela of chronic microvascular ischemia. 2. No acute intracranial abnormality or significant interval change. *ASPECTS is 10/10 * These results were called by telephone at the time of  interpretation on 09/10/2019 at 4:56 pm to provider [facility] , who verbally acknowledged these results. Electronically Signed   By: San Morelle M.D.   On: 09/10/2019 16:58     Medications:   . sodium chloride    . sodium chloride    . sodium chloride     . amLODipine  10 mg Oral QPC lunch  . aspirin EC  81 mg Oral QPC lunch  . carvedilol  6.25 mg Oral BID  . Chlorhexidine Gluconate Cloth  6 each Topical Daily  . Chlorhexidine Gluconate Cloth  6 each Topical Q0600  . Chlorhexidine Gluconate Cloth  6 each Topical Q0600  . clopidogrel  75 mg Oral Daily  . heparin injection (subcutaneous)  5,000 Units Subcutaneous Q8H  . insulin aspart  0-9 Units Subcutaneous TID WC  . sodium chloride flush  3 mL Intravenous Q12H   sodium chloride, sodium chloride, sodium chloride, acetaminophen **OR** acetaminophen, alteplase, fluticasone, heparin, heparin, labetalol, ondansetron **OR** ondansetron (ZOFRAN) IV, sodium chloride flush  Assessment/ Plan:  1.  Hyperkalemia- due to noncompliance with HD.  Will plan for HD today to help correct potassium.  Will follow K level tomorrow if she remains an inpatient.  2.  ESRD -  Stressed compliance with HD.  She appears to be usually Monday Wednesday Friday.  Will dialyze 09/11/2019 3.  Hypertension/volume  - bp elevated, only slightly above edw.  Will challenge and follow 4.  Anemia  -  stable 5.  Metabolic bone disease -   Check labs and continue with outpatient meds 6.  Nutrition - renal diet 7. History of discitis and Pseudomonas bacteremia.  Following blood cultures   LOS: 1 Sherril Croon @TODAY @9 :41 AM

## 2019-09-11 NOTE — TOC Initial Note (Signed)
Transition of Care [facility]) - Initial/Assessment Note    Patient Details  Name: [name] MRN: [id number] Date of Birth: [birth year]  Transition of Care [facility]) CM/SW Contact:    Kia Stavros, Chauncey [place], RN Phone Number: 09/11/2019, 11:24 AM  Clinical Narrative:      Patient presents with hyperkalemia, ? Missed HD session. From home, independent. High risk readmission score due to active RX's and ED visits (3 out of 6 months with two hospital admissions). Patient was admitted in June for removal of infected RUA graft and again in August for bacteremia.   She lives in [place]. She has HD sessions at [facility] in [place].           Expected Discharge Plan: Home/Self Care Barriers to Discharge: Continued Medical Work up Expected Discharge Plan and Services Expected Discharge Plan: Home/Self Care   Discharge Planning Services: CM Consult   Living arrangements for the past 2 months: Single Family Home                   Prior Living Arrangements/Services Living arrangements for the past 2 months: Single Family Home Lives with:: Self                   Activities of Daily Living Home Assistive Devices/Equipment: Environmental consultant (specify type) ADL Screening (condition at time of admission) Patient's cognitive ability adequate to safely complete daily activities?: Yes Is the patient deaf or have difficulty hearing?: No Does the patient have difficulty seeing, even when wearing glasses/contacts?: Yes Does the patient have difficulty concentrating, remembering, or making decisions?: No Patient able to express need for assistance with ADLs?: Yes Does the patient have difficulty dressing or bathing?: No Independently performs ADLs?: Yes (appropriate for developmental age) Does the patient have difficulty walking or climbing stairs?: No Weakness of Legs: None Weakness of Arms/Hands: None  Admission diagnosis:  End-stage renal disease on hemodialysis ([place]) [N18.6, Z99.2] Nausea vomiting and  diarrhea [R11.2, R19.7] Patient Active Problem List   Diagnosis Date Noted  . Hyperkalemia 09/10/2019  . End-stage renal disease on hemodialysis ([place])   . Nausea vomiting and diarrhea   . Bacteremia 07/20/2019  . ESRD needing dialysis ([place]) 05/15/2019  . Anemia of chronic renal failure 04/03/2018  . Essential hypertension 04/03/2018  . Type 2 diabetes mellitus with diabetic nephropathy ([place]) 04/03/2018   PCP:  Manon Hilding, MD Pharmacy:   [facility], [place] [address] Phone: [phone] Fax: [phone]     Social Determinants of Health ([place]) Interventions    Readmission Risk Interventions Readmission Risk Prevention Plan 09/11/2019 07/25/2019 07/24/2019  Transportation Screening Complete - Complete  PCP or Specialist Appt within 3-5 Days Complete Not Complete -  Not Complete comments - Message left for patient PCP office requesting follow up appointment. Return call not received. -  [place] or Home Care Consult Complete - -  Social Work Consult for [place] Planning/Counseling Complete - -  Palliative Care Screening Not Applicable - Not Applicable  Medication Review (RN Care Manager) Complete - Complete  Some recent data might be hidden

## 2019-09-11 NOTE — Progress Notes (Signed)
PROGRESS NOTE    [name]  [id number]  DOB: 29-Jul-1951  DOA: 09/09/2019 PCP: Manon Hilding, MD   Brief Admission Hx: 68 y/o female on MWF HD presented with AMS after missing HD on Friday 10/16 and presented with hyperkalemia and metabolic encephalopathy.   MDM/Assessment & Plan:   1. Metabolic encephalopathy likely secondary to uremia after missing HD on 09/08/2019.  Patient is being treated with hemodialysis.  She received HD on 09/10/2019 and is due to receive additional HD today to stay on schedule.  Appreciate nephrology assistance. 2. Type 2 diabetes mellitus-blood sugars are being monitored with frequent CBG testing and supplemental sliding scale insulin ordered. 3. Hyperkalemia-patient has been treated with Lokelma calcium gluconate and Kayexalate.  She had HD 09/10/2019 and will have HD today.  Recheck renal function panel in a.m. 4. Recent Pseudomonas bacteremia-she was treated with 6 weeks of Fortaz and repeat blood cultures done 09/10/2019 no growth to date. 5. ESRD - pt received HD 10/18 and 10/19.     DVT prophylaxis: subcut heparin Code Status: Full  Family Communication: daughter telephone  Disposition Plan: continue inpatient treatment   Consultants:  Nephrology   Procedures:    Antimicrobials:     Subjective: Pt less encephalopathic today appears to be approaching baseline  Objective: Vitals:   09/11/19 0008 09/11/19 0409 09/11/19 0747 09/11/19 0800  BP: (!) 126/97 (!) 142/54  (!) 110/95  Pulse: 96 89  (!) 101  Resp: 18 20    Temp: 99.3 F (37.4 C) 99.4 F (37.4 C)  98.5 F (36.9 C)  TempSrc: Oral Oral  Oral  SpO2: 99% 98% 96% 98%  Weight:      Height:        Intake/Output Summary (Last 24 hours) at 09/11/2019 1320 Last data filed at 09/11/2019 0900 Gross per 24 hour  Intake 240 ml  Output 878 ml  Net -638 ml   Filed Weights   09/09/19 2054 09/10/19 0508 09/10/19 1330  Weight: 63.5 kg 65.7 kg 65.8 kg   REVIEW OF  SYSTEMS  UTO due to confusion  Exam:  General exam: chronically ill appearing female sitting up in bed, more alert today and able to answer questions.  NAD. Cooperative for exam.  Respiratory system: No increased work of breathing. Cardiovascular system: normal S1 & S2 heard.  Gastrointestinal system: Abdomen is nondistended, soft and nontender. Normal bowel sounds heard. Central nervous system: Alert, slightly confused no focal neurological deficits. Extremities: no cyanosis.  Data Reviewed: Basic Metabolic Panel: Recent Labs  Lab 09/10/19 0015 09/10/19 0705 09/11/19 0608  NA 139 139 138  K 6.1* 6.4* 5.4*  CL 102 102 95*  CO2 21* 23 26  GLUCOSE 260* 142* 90  BUN 55* 58* 29*  CREATININE 9.66* 9.95* 7.15*  CALCIUM 8.8* 8.7* 8.6*  PHOS  --   --  5.4*   Liver Function Tests: Recent Labs  Lab 09/10/19 0015 09/10/19 0705 09/11/19 0608  AST 26 17  --   ALT 27 21  --   ALKPHOS 106 92  --   BILITOT 0.2* 0.3  --   PROT 8.7* 7.5  --   ALBUMIN 3.1* 2.9* 2.7*   Recent Labs  Lab 09/10/19 0015  LIPASE 41   No results for input(s): AMMONIA in the last 168 hours. CBC: Recent Labs  Lab 09/10/19 0015 09/10/19 0705  WBC 10.5 7.5  NEUTROABS 9.1*  --   HGB 12.3 11.8*  HCT 41.0 39.0  MCV 89.5 90.3  PLT PLATELET CLUMPS NOTED ON SMEAR, COUNT APPEARS ADEQUATE 237   Cardiac Enzymes: No results for input(s): CKTOTAL, CKMB, CKMBINDEX, TROPONINI in the last 168 hours. CBG (last 3)  Recent Labs    09/11/19 0315 09/11/19 0724 09/11/19 1111  GLUCAP 97 98 99   Recent Results (from the past 240 hour(s))  SARS CORONAVIRUS 2 (TAT 6-24 HRS) Nasopharyngeal Nasopharyngeal Swab     Status: None   Collection Time: 09/10/19 12:17 AM   Specimen: Nasopharyngeal Swab  Result Value Ref Range Status   SARS Coronavirus 2 NEGATIVE NEGATIVE Final    Comment: (NOTE) SARS-CoV-2 target nucleic acids are NOT DETECTED. The SARS-CoV-2 RNA is generally detectable in upper and lower respiratory  specimens during the acute phase of infection. Negative results do not preclude SARS-CoV-2 infection, do not rule out co-infections with other pathogens, and should not be used as the sole basis for treatment or other patient management decisions. Negative results must be combined with clinical observations, patient history, and epidemiological information. The expected result is Negative. Fact Sheet for Patients: SugarRoll.be Fact Sheet for Healthcare Providers: https://www.woods-mathews.com/ This test is not yet approved or cleared by the [place] FDA and  has been authorized for detection and/or diagnosis of SARS-CoV-2 by FDA under an Emergency Use Authorization (EUA). This EUA will remain  in effect (meaning this test can be used) for the duration of the COVID-19 declaration under Section 56 4(b)(1) of the Act, 21 U.S.C. section 360bbb-3(b)(1), unless the authorization is terminated or revoked sooner. Performed at [facility], [place] [address], [place], [place] [address]   MRSA PCR Screening     Status: None   Collection Time: 09/10/19  5:25 AM   Specimen: Nasopharyngeal  Result Value Ref Range Status   MRSA by PCR NEGATIVE NEGATIVE Final    Comment:        The GeneXpert MRSA Assay (FDA approved for NASAL specimens only), is one component of a comprehensive MRSA colonization surveillance program. It is not intended to diagnose MRSA infection nor to guide or monitor treatment for MRSA infections. Performed at [facility], [address], [place], [place] [address]   Culture, blood (Routine X 2) w Reflex to ID Panel     Status: None (Preliminary result)   Collection Time: 09/10/19  1:30 PM   Specimen: BLOOD  Result Value Ref Range Status   Specimen Description BLOOD  Final   Special Requests NONE  Final   Culture   Final    NO GROWTH < 24 HOURS Performed at [facility], [address] [place], [place] [address]     Report Status PENDING  Incomplete  Culture, blood (Routine X 2) w Reflex to ID Panel     Status: None (Preliminary result)   Collection Time: 09/10/19  1:50 PM   Specimen: BLOOD  Result Value Ref Range Status   Specimen Description BLOOD  Final   Special Requests NONE  Final   Culture   Final    NO GROWTH < 24 HOURS Performed at [facility], [address], [place], [place] [address]    Report Status PENDING  Incomplete     Studies: Ct Abdomen Pelvis W Contrast  Result Date: 09/10/2019 CLINICAL DATA:  Acute abdominal pain. Nausea and vomiting. Missed dialysis yesterday. EXAM: CT ABDOMEN AND PELVIS WITH CONTRAST TECHNIQUE: Multidetector CT imaging of the abdomen and pelvis was performed using the standard protocol following bolus administration of intravenous contrast. CONTRAST:  19mL OMNIPAQUE IOHEXOL 300 MG/ML  SOLN COMPARISON:  CT 07/19/2019 FINDINGS: Lower chest: Cardiomegaly. Mitral annulus calcifications. Small pericardial effusion. Wall thickening of the distal esophagus. No pleural fluid. Hepatobiliary: No focal liver abnormality is seen. No gallstones, gallbladder wall thickening, or biliary dilatation. Pancreas: No ductal dilatation or inflammation. Spleen: Normal in size without focal abnormality. Adrenals/Urinary Tract: No adrenal nodule. Bilateral renal parenchymal thinning. Small cysts as well as low-density lesions in both kidneys that are too small to accurately characterize. No hydronephrosis. Mild enhancement about the right renal pelvis, series 2, image 40. previous left proximal ureteral thickening has improved. Urinary bladder is partially distended with mild bladder wall thickening. Absent renal excretion on delayed phase imaging consistent with chronic renal disease. Stomach/Bowel: Small to moderate hiatal hernia with mild distal esophageal wall thickening. Stomach is otherwise decompressed which limits assessment. No small bowel obstruction or inflammatory change. Unchanged  appearance of the pending suggest mildly dilated 8 mm, but no focal periappendiceal inflammation or fat stranding. Equivocal colonic thickening of the hepatic flexure, transverse, and descending colon versus nondistention. No definite pericolonic inflammation. Distal colonic diverticulosis without diverticulitis. Vascular/Lymphatic: Advanced aortic and branch atherosclerosis. No aneurysm. Patent portal vein. No enlarged lymph nodes in the abdomen or pelvis. Reproductive: Status post hysterectomy. No adnexal masses. Other: No free air, free fluid, or intra-abdominal fluid collection. Musculoskeletal: Lucencies involving inferior endplate of L3 and superior endplate of L4 have slightly progressed from prior CT, however have been evaluated by MRI in the interim. IMPRESSION: 1. Equivocal colonic thickening/colitis of the hepatic flexure, transverse, and descending colon versus nondistention. 2. Small to moderate hiatal hernia with distal esophageal wall thickening, suggesting reflux or esophagitis. 3. Improved left ureteral thickening from prior exam, however there is now enhancement to the right renal pelvis in addition to mild bladder wall thickening. Ascending urinary tract infection is considered. 4. Similar-appearing appendix which is mildly dilated and fluid-filled but without periappendiceal inflammation, likely reflecting patient's baseline. 5. Lucencies involving L3 and L4 have slightly progressed from prior CT, have been evaluated by MRI in the interim in felt to represent either large Schmorl's nodes or dialysis related embolized spondyloarthropathy. Aortic Atherosclerosis (ICD10-I70.0). Electronically Signed   By: Keith Rake M.D.   On: 09/10/2019 01:22   Dg Chest Port 1 View  Result Date: 09/09/2019 CLINICAL DATA:  68 year old female missed dialysis this week. Hyperglycemia and vomiting. EXAM: PORTABLE CHEST 1 VIEW COMPARISON:  Portable chest 07/25/2019 and earlier. FINDINGS: Portable AP upright  view at 2247 hours. Larger lung volumes. Stable right chest tunneled type dual lumen dialysis catheter. Stable cardiac size and mediastinal contours. Cardiac size at the upper limits of normal. Visualized tracheal air column is within normal limits. Allowing for portable technique the lungs are clear. Paucity of bowel gas in the upper abdomen. No acute osseous abnormality identified. IMPRESSION: Improved lung volumes.  No acute cardiopulmonary abnormality. Electronically Signed   By: Genevie Ann M.D.   On: 09/09/2019 22:55   Ct Head Code Stroke Wo Contrast  Result Date: 09/10/2019 CLINICAL DATA:  Code stroke. Acute onset of confusion, lethargy, and not responding to commands. Hyperkalemia. EXAM: CT HEAD WITHOUT CONTRAST TECHNIQUE: Contiguous axial images were obtained from the base of the skull through the vertex without intravenous contrast. COMPARISON:  CT head without contrast 07/19/2019 FINDINGS: Brain: Diffuse periventricular and subcortical white matter disease is stable. No acute infarct, hemorrhage, or mass lesion is present. Remote infarct of the left thalamus is stable the brainstem and cerebellum are within normal limits. The ventricles are of normal  size. No significant extraaxial fluid collection is present. Vascular: Atherosclerotic changes are present within the cavernous internal carotid arteries bilaterally. There is no hyperdense vessel. Skull: Calvarium is intact. No focal lytic or blastic lesions are present. Sinuses/Orbits: The paranasal sinuses and mastoid air cells are clear. Right lens replacement is present. Globes and orbits are otherwise within normal limits. ASPECTS [facility] Stroke Program Early CT Score) - Ganglionic level infarction (caudate, lentiform nuclei, internal capsule, insula, M1-M3 cortex): 7/7 - Supraganglionic infarction (M4-M6 cortex): 3/3 Total score (0-10 with 10 being normal): 10/10 IMPRESSION: 1. Stable atrophy and diffuse white matter disease. This likely reflects the  sequela of chronic microvascular ischemia. 2. No acute intracranial abnormality or significant interval change. *ASPECTS is 10/10 * These results were called by telephone at the time of interpretation on 09/10/2019 at 4:56 pm to provider [facility] , who verbally acknowledged these results. Electronically Signed   By: San Morelle M.D.   On: 09/10/2019 16:58     Scheduled Meds:  amLODipine  10 mg Oral QPC lunch   aspirin EC  81 mg Oral QPC lunch   carvedilol  6.25 mg Oral BID   Chlorhexidine Gluconate Cloth  6 each Topical Daily   Chlorhexidine Gluconate Cloth  6 each Topical Q0600   Chlorhexidine Gluconate Cloth  6 each Topical Q0600   Chlorhexidine Gluconate Cloth  6 each Topical Q0600   clopidogrel  75 mg Oral Daily   heparin injection (subcutaneous)  5,000 Units Subcutaneous Q8H   insulin aspart  0-9 Units Subcutaneous TID WC   sodium chloride flush  3 mL Intravenous Q12H   Continuous Infusions:  sodium chloride     sodium chloride     sodium chloride      Active Problems:   Hyperkalemia   End-stage renal disease on hemodialysis (HCC)   Nausea vomiting and diarrhea   Time spent:   Irwin Brakeman, MD [facility] 09/11/2019, 1:20 PM    LOS: 1 day  How to contact the [facility] Attending or Consulting provider [place] or covering provider during after hours [place], for this patient?  1. Check the care team in [facility] and look for a) attending/consulting TR[facility] provider listed and b) the [facility] team listed 2. Log into [email or web address] and use [facility]'s universal password to access. If you do not have the password, please contact the hospital operator. 3. Locate the [facility] provider you are looking for under [facility] and page to a number that you can be directly reached. 4. If you still have difficulty reaching the provider, please page the [facility] (Director on Call) for the Hospitalists listed on amion for assistance.

## 2019-09-12 ENCOUNTER — Other Ambulatory Visit: Payer: Self-pay

## 2019-09-12 DIAGNOSIS — N186 End stage renal disease: Secondary | ICD-10-CM

## 2019-09-12 DIAGNOSIS — Z992 Dependence on renal dialysis: Secondary | ICD-10-CM

## 2019-09-12 LAB — RENAL FUNCTION PANEL
Albumin: 3.1 g/dL — ABNORMAL LOW (ref 3.5–5.0)
Anion gap: 17 — ABNORMAL HIGH (ref 5–15)
BUN: 20 mg/dL (ref 8–23)
CO2: 27 mmol/L (ref 22–32)
Calcium: 8.9 mg/dL (ref 8.9–10.3)
Chloride: 90 mmol/L — ABNORMAL LOW (ref 98–111)
Creatinine, Ser: 5.44 mg/dL — ABNORMAL HIGH (ref 0.44–1.00)
GFR calc Af Amer: 9 mL/min — ABNORMAL LOW (ref >60)
GFR calc non Af Amer: 7 mL/min — ABNORMAL LOW (ref >60)
Glucose, Bld: 89 mg/dL (ref 70–99)
Phosphorus: 5.9 mg/dL — ABNORMAL HIGH (ref 2.5–4.6)
Potassium: 4 mmol/L (ref 3.5–5.1)
Sodium: 134 mmol/L — ABNORMAL LOW (ref 135–145)

## 2019-09-12 LAB — GLUCOSE, CAPILLARY
Glucose-Capillary: 78 mg/dL (ref 70–99)
Glucose-Capillary: 76 mg/dL (ref 70–99)

## 2019-09-12 MED ORDER — CHLORHEXIDINE GLUCONATE CLOTH 2 % EX PADS
6.0000 | MEDICATED_PAD | Freq: Every day | CUTANEOUS | Status: DC
  Administered 2019-09-12: 6 via TOPICAL

## 2019-09-12 NOTE — Progress Notes (Signed)
Discharge teaching complete. Patient verbalize understanding. IV removed. 2 x 2 gauze applied. Patient tolerated well. Transported home by son via family vehicle.

## 2019-09-12 NOTE — TOC Transition Note (Signed)
Transition of Care [facility]) - CM/SW Discharge Note   Patient Details  Name: [name] MRN: [id number] Date of Birth: 03-22-1951  Transition of Care [facility]) CM/SW Contact:  Veldon Wager, Chauncey Reading, RN Phone Number: 09/12/2019, 10:42 AM   Clinical Narrative:   Patient discharging today. Reports no issues with HD transportation, states she did drive herself, but now her son takes her. No issues with obtaining medications.   F/u appt scheduled.     Final next level of care: Home/Self Care Barriers to Discharge: No Barriers Identified   Discharge Plan and Services   Discharge Planning Services: CM Consult                      HH Arranged: Patient Refused [facility]          Social Determinants of Health (SDOH) Interventions     Readmission Risk Interventions Readmission Risk Prevention Plan 09/12/2019 09/11/2019 07/25/2019  Transportation Screening - Complete -  PCP or Specialist Appt within 3-5 Days - Complete Not Complete  Not Complete comments - - Message left for patient PCP office requesting follow up appointment. Return call not received.  [place] or Home Care Consult Not Complete Complete -  HRI or Home Care Consult comments patient declined - -  Social Work Consult for [place] Planning/Counseling - Complete -  Palliative Care Screening - Not Applicable -  Medication Review (RN Care Manager) - Complete -  Some recent data might be hidden

## 2019-09-12 NOTE — Discharge Instructions (Signed)
Please resume regular HD schedule tomorrow.  Please do NOT miss any HD treatments.   IMPORTANT INFORMATION: PAY CLOSE ATTENTION   PHYSICIAN DISCHARGE INSTRUCTIONS  Follow with Primary care provider  Sasser, Silvestre Moment, MD  and other consultants as instructed by your Hospitalist Physician  [place] IF SYMPTOMS COME BACK, WORSEN OR NEW PROBLEM DEVELOPS   Please note: You were cared for by a hospitalist during your hospital stay. Every effort will be made to forward records to your primary care provider.  You can request that your primary care provider send for your hospital records if they have not received them.  Once you are discharged, your primary care physician will handle any further medical issues. Please note that NO REFILLS for any discharge medications will be authorized once you are discharged, as it is imperative that you return to your primary care physician (or establish a relationship with a primary care physician if you do not have one) for your post hospital discharge needs so that they can reassess your need for medications and monitor your lab values.  Please get a complete blood count and chemistry panel checked by your Primary MD at your next visit, and again as instructed by your Primary MD.  Get Medicines reviewed and adjusted: Please take all your medications with you for your next visit with your Primary MD  Laboratory/radiological data: Please request your Primary MD to go over all hospital tests and procedure/radiological results at the follow up, please ask your primary care provider to get all Hospital records sent to his/her office.  In some cases, they will be blood work, cultures and biopsy results pending at the time of your discharge. Please request that your primary care provider follow up on these results.  If you are diabetic, please bring your blood sugar readings with you to your follow up appointment with primary care.     Please call and make your follow up appointments as soon as possible.    Also Note the following: If you experience worsening of your admission symptoms, develop shortness of breath, life threatening emergency, suicidal or homicidal thoughts you must seek medical attention immediately by calling 911 or calling your MD immediately  if symptoms less severe.  You must read complete instructions/literature along with all the possible adverse reactions/side effects for all the Medicines you take and that have been prescribed to you. Take any new Medicines after you have completely understood and accpet all the possible adverse reactions/side effects.   Do not drive when taking Pain medications or sleeping medications (Benzodiazepines)  Do not take more than prescribed Pain, Sleep and Anxiety Medications. It is not advisable to combine anxiety,sleep and pain medications without talking with your primary care practitioner  Special Instructions: If you have smoked or chewed Tobacco  in the last 2 yrs please stop smoking, stop any regular Alcohol  and or any Recreational drug use.  Wear Seat belts while driving.  Do not drive if taking any narcotic, mind altering or controlled substances or recreational drugs or alcohol.

## 2019-09-12 NOTE — Discharge Summary (Signed)
Physician Discharge Summary  [name] [id number] DOB: 29-Oct-1951 DOA: 09/09/2019  PCP: Manon Hilding, MD  Admit date: 09/09/2019 Discharge date: 09/12/2019  Admitted From:  Home  Disposition: Home   Recommendations for Outpatient Follow-up:  1. Resume regular HD schedule tomorrow  2. Follow up with PCP as scheduled 3. Follow up with Dr. Baxter Flattery ID on 11/3 as scheduled   Discharge Condition: STABLE   CODE STATUS: FULL    Brief Hospitalization Summary: Please see all hospital notes, images, labs for full details of the hospitalization. HPI Dr. Darrick Meigs:   [name]  is a 68 y.o. female, with history of ESRD on hemodialysis Monday Wednesday Friday, diabetes mellitus type 2, hypertension, stroke, Pseudomonas bacteremia with concern for infectious discitis/osteomyelitis at L3-L4, completed 6 weeks of antibiotics on Monday, 09/04/2019, came to ED today with complaints of nausea and vomiting.  As per family member, patient blood glucose was 110 which was low for her so they fed her [place] food after that patient started vomiting.  She had very minimal diarrhea.  In the ED CT scan of the abdomen showed equivocal colitis.  Patient denies abdominal pain.  Patient missed her dialysis on Friday as it was her birthday. Denies chest pain or shortness of breath. Denies fever chills She has been coughing occasionally  Lab work in the ED showed potassium 6.1, creatinine 9.66.  Nephrology, Dr. Delena Bali was consulted and he recommended observation for dialysis in a.m.  Patient received calcium gluconate 1 g IV, Lokelma in the ED.  Brief Admission Hx: 68 y/o female on MWF HD presented with AMS after missing HD on Friday 10/16 and presented with hyperkalemia and metabolic encephalopathy.   MDM/Assessment & Plan:   1. Metabolic encephalopathy RESOLVED NOW, thought secondary to uremia after missing HD on 09/08/2019.  Patient was treated with hemodialysis and improved.  She received HD on  09/10/2019 and 09/11/19.   Appreciate nephrology assistance. 2. Type 2 diabetes mellitus-blood sugars are being monitored with frequent CBG testing and supplemental sliding scale insulin ordered.  Resume home treatment regimen at discharge.  3. Hyperkalemia-Resolved now.  Patient has been treated with Lokelma, calcium gluconate and Kayexalate.  She had HD 09/10/2019 and 09/11/19.   4. Recent Pseudomonas bacteremia-she was treated with 6 weeks of Fortaz and repeat blood cultures done 09/10/2019 no growth to date.  Pt has follow up with ID 11/3 with Dr. Baxter Flattery.  5. ESRD - pt received HD 10/18 and 10/19.  Resume regular HD scheduled 10/21.    DVT prophylaxis: subcut heparin Code Status: Full  Family Communication: daughter telephone  Disposition Plan: Home  Consultants:  Nephrology  Discharge Diagnoses:  Active Problems:   Hyperkalemia   End-stage renal disease on hemodialysis (HCC)   Nausea vomiting and diarrhea   Discharge Instructions:  Allergies as of 09/12/2019   No Known Allergies     Medication List    TAKE these medications   amLODipine 10 MG tablet Commonly known as: NORVASC Take 10 mg by mouth daily after lunch.   aspirin EC 81 MG tablet Take 81 mg by mouth daily after lunch.   calcium carbonate 500 MG chewable tablet Commonly known as: TUMS - dosed in mg elemental calcium Chew 1 tablet by mouth 2 (two) times daily. With lunch & supper   carvedilol 6.25 MG tablet Commonly known as: COREG Take 6.25 mg by mouth 2 (two) times daily.   clopidogrel 75 MG tablet Commonly known as: Plavix Take 1 tablet (75 mg total) by  mouth daily.   fluticasone 50 MCG/ACT nasal spray Commonly known as: FLONASE Place 1 spray into both nostrils daily as needed for allergies or rhinitis.   insulin NPH-regular Human (70-30) 100 UNIT/ML injection Inject 10-20 Units into the skin See admin instructions. Use twice daily per sliding scale: Blood sugar less than 150=12 units & Blood  sugar 200 or more=20 units   isosorbide mononitrate 30 MG 24 hr tablet Commonly known as: IMDUR Take 30 mg by mouth daily after lunch.   multivitamin Tabs tablet Take 1 tablet by mouth daily after lunch.      Follow-up Information    [facility] up on 09/19/2019.   Why: Please follow up with Dr. Quintin Alto on Tuesday, October 27th at 11:30am. Contact information: [place] [address] [phone]          No Known Allergies Allergies as of 09/12/2019   No Known Allergies     Medication List    TAKE these medications   amLODipine 10 MG tablet Commonly known as: NORVASC Take 10 mg by mouth daily after lunch.   aspirin EC 81 MG tablet Take 81 mg by mouth daily after lunch.   calcium carbonate 500 MG chewable tablet Commonly known as: TUMS - dosed in mg elemental calcium Chew 1 tablet by mouth 2 (two) times daily. With lunch & supper   carvedilol 6.25 MG tablet Commonly known as: COREG Take 6.25 mg by mouth 2 (two) times daily.   clopidogrel 75 MG tablet Commonly known as: Plavix Take 1 tablet (75 mg total) by mouth daily.   fluticasone 50 MCG/ACT nasal spray Commonly known as: FLONASE Place 1 spray into both nostrils daily as needed for allergies or rhinitis.   insulin NPH-regular Human (70-30) 100 UNIT/ML injection Inject 10-20 Units into the skin See admin instructions. Use twice daily per sliding scale: Blood sugar less than 150=12 units & Blood sugar 200 or more=20 units   isosorbide mononitrate 30 MG 24 hr tablet Commonly known as: IMDUR Take 30 mg by mouth daily after lunch.   multivitamin Tabs tablet Take 1 tablet by mouth daily after lunch.       Procedures/Studies: Mr Lumbar Spine Wo Contrast  Result Date: 08/23/2019 CLINICAL DATA:  Persistent back pain.  Follow-up prior abnormal MRI EXAM: MRI LUMBAR SPINE WITHOUT CONTRAST TECHNIQUE: Multiplanar, multisequence MR imaging of the lumbar spine was performed. No  intravenous contrast was administered. COMPARISON:  MRI 07/19/2019 FINDINGS: Segmentation: There are five lumbar type vertebral bodies. The last full intervertebral disc space is labeled L5-S1. This correlates with the prior MRI. Alignment:  Normal. Vertebrae: Diffuse abnormal marrow signal, unchanged when compared to prior study. Findings most likely due to renal osteodystrophy. Stable changes at L3 and L4, likely Schmorl's nodes or possibly hemodialysis related spondyloarthropathy. No disc space narrowing, endplate destructive changes or epidural abscess to suggest this is discitis/osteomyelitis. Conus medullaris and cauda equina: Conus extends to the L1 level. Conus and cauda equina appear normal. Paraspinal and other soft tissues: No significant findings. Disc levels: Stable mild spinal and lateral recess stenosis at L3-4 due to bulging annulus. No significant disc protrusions or foraminal stenosis. IMPRESSION: 1. Stable diffuse marrow signal abnormality likely due to renal osteodystrophy. 2. Stable changes at L3 and L4, likely large Schmorl's nodes or dialysis related amyloid spondyloarthropathy. No findings to suggest discitis/osteomyelitis. Electronically Signed   By: Marijo Sanes M.D.   On: 08/23/2019 16:16   Ct Abdomen Pelvis W Contrast  Result Date: 09/10/2019 CLINICAL DATA:  Acute abdominal pain. Nausea and vomiting. Missed dialysis yesterday. EXAM: CT ABDOMEN AND PELVIS WITH CONTRAST TECHNIQUE: Multidetector CT imaging of the abdomen and pelvis was performed using the standard protocol following bolus administration of intravenous contrast. CONTRAST:  178mL OMNIPAQUE IOHEXOL 300 MG/ML  SOLN COMPARISON:  CT 07/19/2019 FINDINGS: Lower chest: Cardiomegaly. Mitral annulus calcifications. Small pericardial effusion. Wall thickening of the distal esophagus. No pleural fluid. Hepatobiliary: No focal liver abnormality is seen. No gallstones, gallbladder wall thickening, or biliary dilatation. Pancreas: No  ductal dilatation or inflammation. Spleen: Normal in size without focal abnormality. Adrenals/Urinary Tract: No adrenal nodule. Bilateral renal parenchymal thinning. Small cysts as well as low-density lesions in both kidneys that are too small to accurately characterize. No hydronephrosis. Mild enhancement about the right renal pelvis, series 2, image 40. previous left proximal ureteral thickening has improved. Urinary bladder is partially distended with mild bladder wall thickening. Absent renal excretion on delayed phase imaging consistent with chronic renal disease. Stomach/Bowel: Small to moderate hiatal hernia with mild distal esophageal wall thickening. Stomach is otherwise decompressed which limits assessment. No small bowel obstruction or inflammatory change. Unchanged appearance of the pending suggest mildly dilated 8 mm, but no focal periappendiceal inflammation or fat stranding. Equivocal colonic thickening of the hepatic flexure, transverse, and descending colon versus nondistention. No definite pericolonic inflammation. Distal colonic diverticulosis without diverticulitis. Vascular/Lymphatic: Advanced aortic and branch atherosclerosis. No aneurysm. Patent portal vein. No enlarged lymph nodes in the abdomen or pelvis. Reproductive: Status post hysterectomy. No adnexal masses. Other: No free air, free fluid, or intra-abdominal fluid collection. Musculoskeletal: Lucencies involving inferior endplate of L3 and superior endplate of L4 have slightly progressed from prior CT, however have been evaluated by MRI in the interim. IMPRESSION: 1. Equivocal colonic thickening/colitis of the hepatic flexure, transverse, and descending colon versus nondistention. 2. Small to moderate hiatal hernia with distal esophageal wall thickening, suggesting reflux or esophagitis. 3. Improved left ureteral thickening from prior exam, however there is now enhancement to the right renal pelvis in addition to mild bladder wall  thickening. Ascending urinary tract infection is considered. 4. Similar-appearing appendix which is mildly dilated and fluid-filled but without periappendiceal inflammation, likely reflecting patient's baseline. 5. Lucencies involving L3 and L4 have slightly progressed from prior CT, have been evaluated by MRI in the interim in felt to represent either large Schmorl's nodes or dialysis related embolized spondyloarthropathy. Aortic Atherosclerosis (ICD10-I70.0). Electronically Signed   By: Keith Rake M.D.   On: 09/10/2019 01:22   Dg Chest Port 1 View  Result Date: 09/09/2019 CLINICAL DATA:  68 year old female missed dialysis this week. Hyperglycemia and vomiting. EXAM: PORTABLE CHEST 1 VIEW COMPARISON:  Portable chest 07/25/2019 and earlier. FINDINGS: Portable AP upright view at 2247 hours. Larger lung volumes. Stable right chest tunneled type dual lumen dialysis catheter. Stable cardiac size and mediastinal contours. Cardiac size at the upper limits of normal. Visualized tracheal air column is within normal limits. Allowing for portable technique the lungs are clear. Paucity of bowel gas in the upper abdomen. No acute osseous abnormality identified. IMPRESSION: Improved lung volumes.  No acute cardiopulmonary abnormality. Electronically Signed   By: Genevie Ann M.D.   On: 09/09/2019 22:55   Ct Head Code Stroke Wo Contrast  Result Date: 09/10/2019 CLINICAL DATA:  Code stroke. Acute onset of confusion, lethargy, and not responding to commands. Hyperkalemia. EXAM: CT HEAD WITHOUT CONTRAST TECHNIQUE: Contiguous axial images were obtained from the base of the skull through the vertex  without intravenous contrast. COMPARISON:  CT head without contrast 07/19/2019 FINDINGS: Brain: Diffuse periventricular and subcortical white matter disease is stable. No acute infarct, hemorrhage, or mass lesion is present. Remote infarct of the left thalamus is stable the brainstem and cerebellum are within normal limits. The  ventricles are of normal size. No significant extraaxial fluid collection is present. Vascular: Atherosclerotic changes are present within the cavernous internal carotid arteries bilaterally. There is no hyperdense vessel. Skull: Calvarium is intact. No focal lytic or blastic lesions are present. Sinuses/Orbits: The paranasal sinuses and mastoid air cells are clear. Right lens replacement is present. Globes and orbits are otherwise within normal limits. ASPECTS [facility] Stroke Program Early CT Score) - Ganglionic level infarction (caudate, lentiform nuclei, internal capsule, insula, M1-M3 cortex): 7/7 - Supraganglionic infarction (M4-M6 cortex): 3/3 Total score (0-10 with 10 being normal): 10/10 IMPRESSION: 1. Stable atrophy and diffuse white matter disease. This likely reflects the sequela of chronic microvascular ischemia. 2. No acute intracranial abnormality or significant interval change. *ASPECTS is 10/10 * These results were called by telephone at the time of interpretation on 09/10/2019 at 4:56 pm to provider [facility] , who verbally acknowledged these results. Electronically Signed   By: San Morelle M.D.   On: 09/10/2019 16:58      Subjective: Pt sitting up in bed eating breakfast, says she is feeling a lot better.  She wants to go home.   Discharge Exam: Vitals:   09/12/19 0121 09/12/19 0534  BP: (!) 143/48 140/75  Pulse: 79 83  Resp: 18 18  Temp: 99.1 F (37.3 C) 98.2 F (36.8 C)  SpO2: 97% 99%   Vitals:   09/11/19 2137 09/12/19 0121 09/12/19 0534 09/12/19 0618  BP: (!) 150/77 (!) 143/48 140/75   Pulse: 94 79 83   Resp: 20 18 18    Temp: 99.1 F (37.3 C) 99.1 F (37.3 C) 98.2 F (36.8 C)   TempSrc: Oral Oral Oral   SpO2: 98% 97% 99%   Weight:    59.6 kg  Height:        General exam: chronically ill appearing female, alert today and able to answer questions.  NAD. Cooperative for exam.  Respiratory system: No increased work of breathing. Cardiovascular  system: normal S1 & S2 heard.  Gastrointestinal system: Abdomen is nondistended, soft and nontender. Normal bowel sounds heard. Central nervous system: Alert, oriented, no focal neurological deficits. Extremities: no cyanosis.   The results of significant diagnostics from this hospitalization (including imaging, microbiology, ancillary and laboratory) are listed below for reference.     Microbiology: Recent Results (from the past 240 hour(s))  SARS CORONAVIRUS 2 (TAT 6-24 HRS) Nasopharyngeal Nasopharyngeal Swab     Status: None   Collection Time: 09/10/19 12:17 AM   Specimen: Nasopharyngeal Swab  Result Value Ref Range Status   SARS Coronavirus 2 NEGATIVE NEGATIVE Final    Comment: (NOTE) SARS-CoV-2 target nucleic acids are NOT DETECTED. The SARS-CoV-2 RNA is generally detectable in upper and lower respiratory specimens during the acute phase of infection. Negative results do not preclude SARS-CoV-2 infection, do not rule out co-infections with other pathogens, and should not be used as the sole basis for treatment or other patient management decisions. Negative results must be combined with clinical observations, patient history, and epidemiological information. The expected result is Negative. Fact Sheet for Patients: SugarRoll.be Fact Sheet for Healthcare Providers: https://www.woods-mathews.com/ This test is not yet approved or cleared by the [place] FDA and  has been authorized for  detection and/or diagnosis of SARS-CoV-2 by FDA under an Emergency Use Authorization (EUA). This EUA will remain  in effect (meaning this test can be used) for the duration of the COVID-19 declaration under Section 56 4(b)(1) of the Act, 21 U.S.C. section 360bbb-3(b)(1), unless the authorization is terminated or revoked sooner. Performed at [facility], [place] [address], [place], [place] [address]   MRSA PCR Screening     Status: None    Collection Time: 09/10/19  5:25 AM   Specimen: Nasopharyngeal  Result Value Ref Range Status   MRSA by PCR NEGATIVE NEGATIVE Final    Comment:        The GeneXpert MRSA Assay (FDA approved for NASAL specimens only), is one component of a comprehensive MRSA colonization surveillance program. It is not intended to diagnose MRSA infection nor to guide or monitor treatment for MRSA infections. Performed at [facility], [address] [place], [place] [address]   Culture, blood (Routine X 2) w Reflex to ID Panel     Status: None (Preliminary result)   Collection Time: 09/10/19  1:30 PM   Specimen: BLOOD  Result Value Ref Range Status   Specimen Description BLOOD DRAWN BY DIALYSIS RIGHT CHEST CATHETER  Final   Special Requests   Final    BOTTLES DRAWN AEROBIC AND ANAEROBIC Blood Culture adequate volume   Culture   Final    NO GROWTH 2 DAYS Performed at [facility], [address], [place], [place] [address]    Report Status PENDING  Incomplete  Culture, blood (Routine X 2) w Reflex to ID Panel     Status: None (Preliminary result)   Collection Time: 09/10/19  1:50 PM   Specimen: BLOOD  Result Value Ref Range Status   Specimen Description BLOOD DRAWN BY DIALYSIS HEMODIALYSIS CATHETER  Final   Special Requests   Final    BOTTLES DRAWN AEROBIC AND ANAEROBIC Blood Culture adequate volume   Culture   Final    NO GROWTH 2 DAYS Performed at [facility], [address], [place],  [address]    Report Status PENDING  Incomplete     Labs: BNP (last 3 results) No results for input(s): BNP in the last 8760 hours. Basic Metabolic Panel: Recent Labs  Lab 09/10/19 0015 09/10/19 0705 09/11/19 0608 09/12/19 0604  NA 139 139 138 134*  K 6.1* 6.4* 5.4* 4.0  CL 102 102 95* 90*  CO2 21* 23 26 27   GLUCOSE 260* 142* 90 89  BUN 55* 58* 29* 20  CREATININE 9.66* 9.95* 7.15* 5.44*  CALCIUM 8.8* 8.7* 8.6* 8.9  PHOS  --   --  5.4* 5.9*   Liver Function Tests: Recent Labs  Lab  09/10/19 0015 09/10/19 0705 09/11/19 0608 09/12/19 0604  AST 26 17  --   --   ALT 27 21  --   --   ALKPHOS 106 92  --   --   BILITOT 0.2* 0.3  --   --   PROT 8.7* 7.5  --   --   ALBUMIN 3.1* 2.9* 2.7* 3.1*   Recent Labs  Lab 09/10/19 0015  LIPASE 41   No results for input(s): AMMONIA in the last 168 hours. CBC: Recent Labs  Lab 09/10/19 0015 09/10/19 0705  WBC 10.5 7.5  NEUTROABS 9.1*  --   HGB 12.3 11.8*  HCT 41.0 39.0  MCV 89.5 90.3  PLT PLATELET CLUMPS NOTED ON SMEAR, COUNT APPEARS ADEQUATE 237   Cardiac Enzymes: No  results for input(s): CKTOTAL, CKMB, CKMBINDEX, TROPONINI in the last 168 hours. BNP: Invalid input(s): POCBNP CBG: Recent Labs  Lab 09/11/19 1111 09/11/19 1602 09/11/19 2102 09/12/19 0307 09/12/19 0802  GLUCAP 99 90 94 78 76   D-Dimer No results for input(s): DDIMER in the last 72 hours. Hgb A1c Recent Labs    09/10/19 0705  HGBA1C 6.7*   Lipid Profile No results for input(s): CHOL, HDL, LDLCALC, TRIG, CHOLHDL, LDLDIRECT in the last 72 hours. Thyroid function studies No results for input(s): TSH, T4TOTAL, T3FREE, THYROIDAB in the last 72 hours.  Invalid input(s): FREET3 Anemia work up No results for input(s): VITAMINB12, FOLATE, FERRITIN, TIBC, IRON, RETICCTPCT in the last 72 hours. Urinalysis    Component Value Date/Time   COLORURINE YELLOW 07/20/2019 2100   APPEARANCEUR CLEAR 07/20/2019 2100   LABSPEC 1.012 07/20/2019 2100   PHURINE 8.0 07/20/2019 2100   GLUCOSEU NEGATIVE 07/20/2019 2100   HGBUR SMALL (A) 07/20/2019 2100   BILIRUBINUR NEGATIVE 07/20/2019 2100   KETONESUR NEGATIVE 07/20/2019 2100   PROTEINUR >=300 (A) 07/20/2019 2100   NITRITE NEGATIVE 07/20/2019 2100   LEUKOCYTESUR NEGATIVE 07/20/2019 2100   Sepsis Labs Invalid input(s): PROCALCITONIN,  WBC,  LACTICIDVEN Microbiology Recent Results (from the past 240 hour(s))  SARS CORONAVIRUS 2 (TAT 6-24 HRS) Nasopharyngeal Nasopharyngeal Swab     Status: None    Collection Time: 09/10/19 12:17 AM   Specimen: Nasopharyngeal Swab  Result Value Ref Range Status   SARS Coronavirus 2 NEGATIVE NEGATIVE Final    Comment: (NOTE) SARS-CoV-2 target nucleic acids are NOT DETECTED. The SARS-CoV-2 RNA is generally detectable in upper and lower respiratory specimens during the acute phase of infection. Negative results do not preclude SARS-CoV-2 infection, do not rule out co-infections with other pathogens, and should not be used as the sole basis for treatment or other patient management decisions. Negative results must be combined with clinical observations, patient history, and epidemiological information. The expected result is Negative. Fact Sheet for Patients: SugarRoll.be Fact Sheet for Healthcare Providers: https://www.woods-mathews.com/ This test is not yet approved or cleared by the [place] FDA and  has been authorized for detection and/or diagnosis of SARS-CoV-2 by FDA under an Emergency Use Authorization (EUA). This EUA will remain  in effect (meaning this test can be used) for the duration of the COVID-19 declaration under Section 56 4(b)(1) of the Act, 21 U.S.C. section 360bbb-3(b)(1), unless the authorization is terminated or revoked sooner. Performed at [facility], [place] [address] [place], [place] [address]   MRSA PCR Screening     Status: None   Collection Time: 09/10/19  5:25 AM   Specimen: Nasopharyngeal  Result Value Ref Range Status   MRSA by PCR NEGATIVE NEGATIVE Final    Comment:        The GeneXpert MRSA Assay (FDA approved for NASAL specimens only), is one component of a comprehensive MRSA colonization surveillance program. It is not intended to diagnose MRSA infection nor to guide or monitor treatment for MRSA infections. Performed at [facility], [address], [place],  [address]   Culture, blood (Routine X 2) w Reflex to ID Panel     Status: None  (Preliminary result)   Collection Time: 09/10/19  1:30 PM   Specimen: BLOOD  Result Value Ref Range Status   Specimen Description BLOOD DRAWN BY DIALYSIS RIGHT CHEST CATHETER  Final   Special Requests   Final    BOTTLES DRAWN AEROBIC AND ANAEROBIC Blood Culture adequate volume  Culture   Final    NO GROWTH 2 DAYS Performed at [facility], [address] [place], [place] [address]    Report Status PENDING  Incomplete  Culture, blood (Routine X 2) w Reflex to ID Panel     Status: None (Preliminary result)   Collection Time: 09/10/19  1:50 PM   Specimen: BLOOD  Result Value Ref Range Status   Specimen Description BLOOD DRAWN BY DIALYSIS HEMODIALYSIS CATHETER  Final   Special Requests   Final    BOTTLES DRAWN AEROBIC AND ANAEROBIC Blood Culture adequate volume   Culture   Final    NO GROWTH 2 DAYS Performed at [facility], [address], [place], [place] [address]    Report Status PENDING  Incomplete    Time coordinating discharge: 31 minutes   SIGNED:  Irwin Brakeman, MD  [facility] 09/12/2019, 8:44 AM How to contact the [facility] Attending or Consulting provider [place] or covering provider during after hours [place], for this patient?  1. Check the care team in [facility] and look for a) attending/consulting [facility] provider listed and b) the [facility] team listed 2. Log into [email or web address] and use [place] universal password to access. If you do not have the password, please contact the hospital operator. 3. Locate the [facility] provider you are looking for under [facility] and page to a number that you can be directly reached. 4. If you still have difficulty reaching the provider, please page the [facility] (Director on Call) for the Hospitalists listed on amion for assistance.

## 2019-09-12 NOTE — Progress Notes (Signed)
[facility] ROUNDING NOTE   Subjective:   End-stage renal disease 68 year old lady with a history of heart disease, hypertension history of CVA receives dialysis Monday Wednesday Friday received treatment 09/10/2019 for hyperkalemia potassium 6.1.  She also received dialysis on a regular day 09/12/2019.  Net ultrafiltration of 166 cc soft blood pressure until the last 30 minutes of treatment.  Access through right IJ tunneled catheter  Blood pressure 140/75 pulse 83 temperature 98.2 O2 sats 99% room air  Sodium 134 potassium 4 chloride 90 CO2 27 BUN 20 creatinine 5.44 glucose 89 phosphorus 5.9 albumin 3.1.  WBC 15.3 hemoglobin 13.5 platelets 266  Amlodipine 10 mg daily, aspirin 81 mg a carvedilol 6.25 mg twice daily, Plavix 75 mg daily, insulin sliding scale  CT scan head stable atrophy diffuse white matter disease no intracranial abnormality  CT scan abdomen equivocal colonic thickening along the hepatic flexure, transverse and descending colon versus nondistention.  Small hiatal hernia left ureteral thickening 6 mL dilated appendix, lucencies L3 and L4.   Objective:  Vital signs in last 24 hours:  Temp:  [98.1 F (36.7 C)-99.3 F (37.4 C)] 98.2 F (36.8 C) (10/20 0534) Pulse Rate:  [51-103] 83 (10/20 0534) Resp:  [16-20] 18 (10/20 0534) BP: (113-171)/(48-110) 140/75 (10/20 0534) SpO2:  [97 %-99 %] 99 % (10/20 0534) Weight:  [59.6 kg-63.1 kg] 59.6 kg (10/20 0618)  Weight change: -2.7 kg Filed Weights   09/10/19 1330 09/11/19 1500 09/12/19 0618  Weight: 65.8 kg 63.1 kg 59.6 kg    Intake/Output: I/O last 3 completed shifts: In: 480 [P.O.:480] Out: 166 [Other:166]   Intake/Output this shift:  No intake/output data recorded.  CVS- RRR RS- CTA ABD- BS present soft non-distended EXT- no edema   Basic Metabolic Panel: Recent Labs  Lab 09/10/19 0015 09/10/19 0705 09/11/19 0608 09/12/19 0604  NA 139 139 138 134*  K 6.1* 6.4* 5.4* 4.0  CL 102 102 95*  90*  CO2 21* 23 26 27   GLUCOSE 260* 142* 90 89  BUN 55* 58* 29* 20  CREATININE 9.66* 9.95* 7.15* 5.44*  CALCIUM 8.8* 8.7* 8.6* 8.9  PHOS  --   --  5.4* 5.9*    Liver Function Tests: Recent Labs  Lab 09/10/19 0015 09/10/19 0705 09/11/19 0608 09/12/19 0604  AST 26 17  --   --   ALT 27 21  --   --   ALKPHOS 106 92  --   --   BILITOT 0.2* 0.3  --   --   PROT 8.7* 7.5  --   --   ALBUMIN 3.1* 2.9* 2.7* 3.1*   Recent Labs  Lab 09/10/19 0015  LIPASE 41   No results for input(s): AMMONIA in the last 168 hours.  CBC: Recent Labs  Lab 09/10/19 0015 09/10/19 0705  WBC 10.5 7.5  NEUTROABS 9.1*  --   HGB 12.3 11.8*  HCT 41.0 39.0  MCV 89.5 90.3  PLT PLATELET CLUMPS NOTED ON SMEAR, COUNT APPEARS ADEQUATE 237    Cardiac Enzymes: No results for input(s): CKTOTAL, CKMB, CKMBINDEX, TROPONINI in the last 168 hours.  BNP: Invalid input(s): POCBNP  CBG: Recent Labs  Lab 09/11/19 1111 09/11/19 1602 09/11/19 2102 09/12/19 0307 09/12/19 0802  GLUCAP 99 90 94 78 76    Microbiology: Results for orders placed or performed during the hospital encounter of 09/09/19  SARS CORONAVIRUS 2 (TAT 6-24 HRS) Nasopharyngeal Nasopharyngeal Swab     Status: None   Collection Time: 09/10/19 12:17 AM  Specimen: Nasopharyngeal Swab  Result Value Ref Range Status   SARS Coronavirus 2 NEGATIVE NEGATIVE Final    Comment: (NOTE) SARS-CoV-2 target nucleic acids are NOT DETECTED. The SARS-CoV-2 RNA is generally detectable in upper and lower respiratory specimens during the acute phase of infection. Negative results do not preclude SARS-CoV-2 infection, do not rule out co-infections with other pathogens, and should not be used as the sole basis for treatment or other patient management decisions. Negative results must be combined with clinical observations, patient history, and epidemiological information. The expected result is Negative. Fact Sheet for  Patients: SugarRoll.be Fact Sheet for Healthcare Providers: https://www.woods-mathews.com/ This test is not yet approved or cleared by the [place] FDA and  has been authorized for detection and/or diagnosis of SARS-CoV-2 by FDA under an Emergency Use Authorization (EUA). This EUA will remain  in effect (meaning this test can be used) for the duration of the COVID-19 declaration under Section 56 4(b)(1) of the Act, 21 U.S.C. section 360bbb-3(b)(1), unless the authorization is terminated or revoked sooner. Performed at [facility], [place] [address] [place], [place] [address]   MRSA PCR Screening     Status: None   Collection Time: 09/10/19  5:25 AM   Specimen: Nasopharyngeal  Result Value Ref Range Status   MRSA by PCR NEGATIVE NEGATIVE Final    Comment:        The GeneXpert MRSA Assay (FDA approved for NASAL specimens only), is one component of a comprehensive MRSA colonization surveillance program. It is not intended to diagnose MRSA infection nor to guide or monitor treatment for MRSA infections. Performed at [facility], [address] [place], [place] [address]   Culture, blood (Routine X 2) w Reflex to ID Panel     Status: None (Preliminary result)   Collection Time: 09/10/19  1:30 PM   Specimen: BLOOD  Result Value Ref Range Status   Specimen Description BLOOD DRAWN BY DIALYSIS RIGHT CHEST CATHETER  Final   Special Requests   Final    BOTTLES DRAWN AEROBIC AND ANAEROBIC Blood Culture adequate volume   Culture   Final    NO GROWTH 2 DAYS Performed at [facility], [address], [place], [place] [address]    Report Status PENDING  Incomplete  Culture, blood (Routine X 2) w Reflex to ID Panel     Status: None (Preliminary result)   Collection Time: 09/10/19  1:50 PM   Specimen: BLOOD  Result Value Ref Range Status   Specimen Description BLOOD DRAWN BY DIALYSIS HEMODIALYSIS CATHETER  Final   Special Requests    Final    BOTTLES DRAWN AEROBIC AND ANAEROBIC Blood Culture adequate volume   Culture   Final    NO GROWTH 2 DAYS Performed at [facility], [address] [place],  [address]    Report Status PENDING  Incomplete    Coagulation Studies: No results for input(s): LABPROT, INR in the last 72 hours.  Urinalysis: No results for input(s): COLORURINE, LABSPEC, PHURINE, GLUCOSEU, HGBUR, BILIRUBINUR, KETONESUR, PROTEINUR, UROBILINOGEN, NITRITE, LEUKOCYTESUR in the last 72 hours.  Invalid input(s): APPERANCEUR    Imaging: Ct Head Code Stroke Wo Contrast  Result Date: 09/10/2019 CLINICAL DATA:  Code stroke. Acute onset of confusion, lethargy, and not responding to commands. Hyperkalemia. EXAM: CT HEAD WITHOUT CONTRAST TECHNIQUE: Contiguous axial images were obtained from the base of the skull through the vertex without intravenous contrast. COMPARISON:  CT head without contrast 07/19/2019 FINDINGS: Brain: Diffuse periventricular and subcortical white  matter disease is stable. No acute infarct, hemorrhage, or mass lesion is present. Remote infarct of the left thalamus is stable the brainstem and cerebellum are within normal limits. The ventricles are of normal size. No significant extraaxial fluid collection is present. Vascular: Atherosclerotic changes are present within the cavernous internal carotid arteries bilaterally. There is no hyperdense vessel. Skull: Calvarium is intact. No focal lytic or blastic lesions are present. Sinuses/Orbits: The paranasal sinuses and mastoid air cells are clear. Right lens replacement is present. Globes and orbits are otherwise within normal limits. ASPECTS [facility] Stroke Program Early CT Score) - Ganglionic level infarction (caudate, lentiform nuclei, internal capsule, insula, M1-M3 cortex): 7/7 - Supraganglionic infarction (M4-M6 cortex): 3/3 Total score (0-10 with 10 being normal): 10/10 IMPRESSION: 1. Stable atrophy and diffuse white matter disease. This  likely reflects the sequela of chronic microvascular ischemia. 2. No acute intracranial abnormality or significant interval change. *ASPECTS is 10/10 * These results were called by telephone at the time of interpretation on 09/10/2019 at 4:56 pm to provider [facility] , who verbally acknowledged these results. Electronically Signed   By: San Morelle M.D.   On: 09/10/2019 16:58     Medications:   . sodium chloride    . sodium chloride    . sodium chloride     . amLODipine  10 mg Oral QPC lunch  . aspirin EC  81 mg Oral QPC lunch  . carvedilol  6.25 mg Oral BID  . Chlorhexidine Gluconate Cloth  6 each Topical Daily  . Chlorhexidine Gluconate Cloth  6 each Topical Q0600  . Chlorhexidine Gluconate Cloth  6 each Topical Q0600  . Chlorhexidine Gluconate Cloth  6 each Topical Q0600  . clopidogrel  75 mg Oral Daily  . heparin injection (subcutaneous)  5,000 Units Subcutaneous Q8H  . insulin aspart  0-9 Units Subcutaneous TID WC  . sodium chloride flush  3 mL Intravenous Q12H   sodium chloride, sodium chloride, sodium chloride, acetaminophen **OR** acetaminophen, alteplase, fluticasone, heparin, heparin, labetalol, ondansetron **OR** ondansetron (ZOFRAN) IV, sodium chloride flush  Assessment/ Plan:  1. Hyperkalemia- due to noncompliance with HD. Will plan for HD today to help correct potassium. Will follow K level tomorrow if she remains an inpatient. 2. ESRD- Stressed compliance with HD.  She appears to be usually Monday Wednesday Friday.    Tolerated dialysis without any 160 cc of ultrafiltration 09/11/2019.  Next dialysis treatment be 09/13/2019 3. Hypertension/volume- bp elevated, only slightly above edw. Will challenge and follow 4. Anemia- stable 5. Metabolic bone disease- Check labs and continue with outpatient meds 6. Nutrition- renal diet 7. History of discitis and Pseudomonas bacteremia.  Blood cultures negative 8. Metabolic encephalopathy most  likely related to poor compliance with dialysis.    LOS: [place] @TODAY @9 :01 AM

## 2019-09-13 DIAGNOSIS — R7881 Bacteremia: Secondary | ICD-10-CM | POA: Diagnosis not present

## 2019-09-13 DIAGNOSIS — B9689 Other specified bacterial agents as the cause of diseases classified elsewhere: Secondary | ICD-10-CM | POA: Diagnosis not present

## 2019-09-13 DIAGNOSIS — N186 End stage renal disease: Secondary | ICD-10-CM | POA: Diagnosis not present

## 2019-09-13 DIAGNOSIS — Z4902 Encounter for fitting and adjustment of peritoneal dialysis catheter: Secondary | ICD-10-CM | POA: Diagnosis not present

## 2019-09-13 DIAGNOSIS — Z992 Dependence on renal dialysis: Secondary | ICD-10-CM | POA: Diagnosis not present

## 2019-09-14 ENCOUNTER — Other Ambulatory Visit: Payer: Self-pay

## 2019-09-14 NOTE — Patient Outreach (Signed)
[facility]) Care Management  09/14/2019  Jian Lemarr 12/14/1950 [id number]     Transition of Care Referral  Referral Date:09/12/2019 Referral Source: [facility] Liaison  Date of Admission: 09/09/2019 Diagnosis: ESRD Date of Discharge: 09/12/2019 Facility: [place]: [facility]    Outreach attempt # 1 to patient. Recording stating "call can not be completed as dialed." No alternate numbers to attempt for patient.     Plan: RN CM will make outreach attempt to patient within 3-4 business days. RN CM will send unsuccessful outreach letter to patient.   Enzo Montgomery, RN,BSN,CCM [place] Management Telephonic Care Management Coordinator Direct Phone: [phone] Toll Free: [phone] Fax: [phone]

## 2019-09-15 ENCOUNTER — Other Ambulatory Visit: Payer: Self-pay

## 2019-09-15 DIAGNOSIS — B9689 Other specified bacterial agents as the cause of diseases classified elsewhere: Secondary | ICD-10-CM | POA: Diagnosis not present

## 2019-09-15 DIAGNOSIS — N186 End stage renal disease: Secondary | ICD-10-CM | POA: Diagnosis not present

## 2019-09-15 DIAGNOSIS — R7881 Bacteremia: Secondary | ICD-10-CM | POA: Diagnosis not present

## 2019-09-15 DIAGNOSIS — Z992 Dependence on renal dialysis: Secondary | ICD-10-CM | POA: Diagnosis not present

## 2019-09-15 LAB — CULTURE, BLOOD (ROUTINE X 2)
Culture: NO GROWTH
Culture: NO GROWTH
Special Requests: ADEQUATE
Special Requests: ADEQUATE

## 2019-09-15 NOTE — Patient Outreach (Signed)
[facility]) Care Management  09/15/2019  [name] [birth year] [id number]   Transition of Care Referral  Referral Date:09/12/2019 Referral Source: [facility] Liaison  Date of Admission: 09/09/2019 Diagnosis: ESRD Date of Discharge: 09/12/2019 Facility: [place]: [facility]    Outreach attempt #2 to patient. No answer after multiple rings and voicemail not set up.     Plan: RN CM will make outreach attempt to patient within 3-4 business days.  Enzo Montgomery, RN,BSN,CCM [place] Management Telephonic Care Management Coordinator Direct Phone: [phone] Toll Free: [phone] Fax: [phone]

## 2019-09-18 DIAGNOSIS — N186 End stage renal disease: Secondary | ICD-10-CM | POA: Diagnosis not present

## 2019-09-18 DIAGNOSIS — R7881 Bacteremia: Secondary | ICD-10-CM | POA: Diagnosis not present

## 2019-09-18 DIAGNOSIS — Z992 Dependence on renal dialysis: Secondary | ICD-10-CM | POA: Diagnosis not present

## 2019-09-18 DIAGNOSIS — B9689 Other specified bacterial agents as the cause of diseases classified elsewhere: Secondary | ICD-10-CM | POA: Diagnosis not present

## 2019-09-18 NOTE — Progress Notes (Signed)
Code Stroke CT times 1634 call time 1639 beeper time 1646 exam started 1648 exam finished 1648 images sent to [place] exam completed in [facility] called

## 2019-09-19 ENCOUNTER — Other Ambulatory Visit: Payer: Self-pay

## 2019-09-19 NOTE — Patient Outreach (Signed)
[facility]) Care Management  09/19/2019  [name] 20-Nov-1951 [id number]    Transition of Care Referral  Referral Date:09/12/2019 Referral [facility] Liaison Date of Admission:09/09/2019 Diagnosis:ESRD Date of Discharge:09/12/2019 [place] Medicare   Outreach attempt #3 to patient. No answer after multiple rings and unable to leave message.      Plan: RN CM will close case if no response from letter mailed to patient.   Enzo Montgomery, RN,BSN,CCM [place] Management Telephonic Care Management Coordinator Direct Phone: [phone] Toll Free: [phone] Fax: [phone]

## 2019-09-20 ENCOUNTER — Ambulatory Visit: Payer: Self-pay

## 2019-09-20 DIAGNOSIS — R7881 Bacteremia: Secondary | ICD-10-CM | POA: Diagnosis not present

## 2019-09-20 DIAGNOSIS — N186 End stage renal disease: Secondary | ICD-10-CM | POA: Diagnosis not present

## 2019-09-20 DIAGNOSIS — Z992 Dependence on renal dialysis: Secondary | ICD-10-CM | POA: Diagnosis not present

## 2019-09-20 DIAGNOSIS — B9689 Other specified bacterial agents as the cause of diseases classified elsewhere: Secondary | ICD-10-CM | POA: Diagnosis not present

## 2019-09-22 DIAGNOSIS — N186 End stage renal disease: Secondary | ICD-10-CM | POA: Diagnosis not present

## 2019-09-22 DIAGNOSIS — B9689 Other specified bacterial agents as the cause of diseases classified elsewhere: Secondary | ICD-10-CM | POA: Diagnosis not present

## 2019-09-22 DIAGNOSIS — Z992 Dependence on renal dialysis: Secondary | ICD-10-CM | POA: Diagnosis not present

## 2019-09-22 DIAGNOSIS — R7881 Bacteremia: Secondary | ICD-10-CM | POA: Diagnosis not present

## 2019-09-23 DIAGNOSIS — N186 End stage renal disease: Secondary | ICD-10-CM | POA: Diagnosis not present

## 2019-09-23 DIAGNOSIS — Z992 Dependence on renal dialysis: Secondary | ICD-10-CM | POA: Diagnosis not present

## 2019-09-25 DIAGNOSIS — N186 End stage renal disease: Secondary | ICD-10-CM | POA: Diagnosis not present

## 2019-09-25 DIAGNOSIS — Z992 Dependence on renal dialysis: Secondary | ICD-10-CM | POA: Diagnosis not present

## 2019-09-26 ENCOUNTER — Encounter: Payer: Self-pay | Admitting: Internal Medicine

## 2019-09-26 ENCOUNTER — Ambulatory Visit: Payer: Medicare PPO | Admitting: Internal Medicine

## 2019-09-26 ENCOUNTER — Other Ambulatory Visit: Payer: Self-pay

## 2019-09-26 VITALS — BP 189/112 | HR 105 | Temp 99.0°F | Wt 139.0 lb

## 2019-09-26 DIAGNOSIS — A498 Other bacterial infections of unspecified site: Secondary | ICD-10-CM | POA: Diagnosis not present

## 2019-09-26 DIAGNOSIS — M4640 Discitis, unspecified, site unspecified: Secondary | ICD-10-CM | POA: Diagnosis not present

## 2019-09-26 DIAGNOSIS — M5430 Sciatica, unspecified side: Secondary | ICD-10-CM

## 2019-09-26 MED ORDER — GABAPENTIN 100 MG PO CAPS: 100.0000 mg | ORAL_CAPSULE | Freq: Every day | ORAL | 2 refills | Status: DC

## 2019-09-26 NOTE — Progress Notes (Signed)
Patient ID: [name], female   DOB: [birth year], 68 y.o.   MRN: [id number]  HPI 68yo F with ESRD on HD, recently treated for prolonged course of iv abtx iwht fortaz for pseudomonal bacteremia +/- early discitis. At that time had back pain with mri not suggesting significant disease. No signs of osteomyelitis  1. Abnormal L3 and L4 vertebral bodies and intervening disc as seen by CT. Although Infectious Discitis Osteomyelitis is possible, this could also be the sequelae of hemodialysis related spondyloarthropathy and/or intravertebral disc herniations (Schmorl's nodes). Blood cultures today are pending, and if negative recommend a short interval repeat MRI without contrast (e.g. 3-4 weeks) to document stability. 2. Markedly heterogeneous marrow signal throughout the visible spine and pelvis, but absent marrow edema except #1. Favor renal osteodystrophy. 3. Degenerative mild spinal stenosis at L3-L4, and moderate bilateral L3 through L5 neural foraminal stenosis.  Sounds like right that radiates down legs  Outpatient Encounter Medications as of 09/26/2019  Medication Sig  . Accu-Chek Softclix Lancets lancets   . Alcohol Swabs (B-D SINGLE USE SWABS REGULAR) PADS   . amLODipine (NORVASC) 10 MG tablet Take 10 mg by mouth daily after lunch.   Marland Kitchen aspirin EC 81 MG tablet Take 81 mg by mouth daily after lunch.   . calcium carbonate (TUMS - DOSED IN MG ELEMENTAL CALCIUM) 500 MG chewable tablet Chew 1 tablet by mouth 2 (two) times daily. With lunch & supper  . carvedilol (COREG) 6.25 MG tablet Take 6.25 mg by mouth 2 (two) times daily.  . clopidogrel (PLAVIX) 75 MG tablet Take 1 tablet (75 mg total) by mouth daily.  . fluticasone (FLONASE) 50 MCG/ACT nasal spray Place 1 spray into both nostrils daily as needed for allergies or rhinitis.   Marland Kitchen insulin NPH-regular Human (NOVOLIN 70/30) (70-30) 100 UNIT/ML injection Inject 10-20 Units into the skin See admin instructions. Use twice daily  per sliding scale: Blood sugar less than 150=12 units & Blood sugar 200 or more=20 units  . isosorbide mononitrate (IMDUR) 30 MG 24 hr tablet Take 30 mg by mouth daily after lunch.   . multivitamin (RENA-VIT) TABS tablet Take 1 tablet by mouth daily after lunch.    Facility-Administered Encounter Medications as of 09/26/2019  Medication  . 0.9 %  sodium chloride infusion     Patient Active Problem List   Diagnosis Date Noted  . Hyperkalemia 09/10/2019  . End-stage renal disease on hemodialysis ([place])   . Nausea vomiting and diarrhea   . Bacteremia 07/20/2019  . ESRD needing dialysis ([place]) 05/15/2019  . Anemia of chronic renal failure 04/03/2018  . Essential hypertension 04/03/2018  . Type 2 diabetes mellitus with diabetic nephropathy ([place]) 04/03/2018     Health Maintenance Due  Topic Date Due  . Hepatitis C Screening  1951/05/24  . FOOT EXAM  09/07/1960  . OPHTHALMOLOGY EXAM  09/07/1960  . URINE MICROALBUMIN  09/07/1960  . TETANUS/TDAP  09/07/1969  . MAMMOGRAM  09/07/2000  . COLONOSCOPY  09/07/2000  . DEXA SCAN  09/08/2015  . INFLUENZA VACCINE  06/24/2019    Social History   Tobacco Use  . Smoking status: Never Smoker  . Smokeless tobacco: Never Used  Substance Use Topics  . Alcohol use: Never    Frequency: Never  . Drug use: Never  family history includes Alcoholism in her father; Cirrhosis in her father; Diabetes in her mother; Heart failure in her mother; Heart murmur in her mother. Review of Systems 12 point ros is negative except  for low back pain radiating to legs Physical Exam   BP (!) 189/112   Pulse (!) 105   Temp 99 F (37.2 C) (Oral)   Wt 139 lb (63 kg)   BMI 21.77 kg/m   Physical Exam  Constitutional:  oriented to person, place, and time. appears well-developed and well-nourished. No distress.  HENT: [place]/AT, PERRLA, no scleral icterus Mouth/Throat: Oropharynx is clear and moist. No oropharyngeal exudate.  Cardiovascular: Normal rate, regular rhythm  and normal heart sounds. Exam reveals no gallop and no friction rub.  No murmur heard.  Pulmonary/Chest: Effort normal and breath sounds normal. No respiratory distress.  has no wheezes.  Neck = supple, no nuchal rigidity Abdominal: Soft. Bowel sounds are normal.  exhibits no distension. There is no tenderness.  Lymphadenopathy: no cervical adenopathy. No axillary adenopathy Neurological: alert and oriented to person, place, and time.  Skin: Skin is warm and dry. No rash noted. No erythema.  Psychiatric: a normal mood and affect.  behavior is normal.   CBC Lab Results  Component Value Date   WBC 7.5 09/10/2019   RBC 4.32 09/10/2019   HGB 11.8 (L) 09/10/2019   HCT 39.0 09/10/2019   PLT 237 09/10/2019   MCV 90.3 09/10/2019   MCH 27.3 09/10/2019   MCHC 30.3 09/10/2019   RDW 17.6 (H) 09/10/2019   LYMPHSABS 0.9 09/10/2019   MONOABS 0.4 09/10/2019   EOSABS 0.1 09/10/2019    BMET Lab Results  Component Value Date   NA 134 (L) 09/12/2019   K 4.0 09/12/2019   CL 90 (L) 09/12/2019   CO2 27 09/12/2019   GLUCOSE 89 09/12/2019   BUN 20 09/12/2019   CREATININE 5.44 (H) 09/12/2019   CALCIUM 8.9 09/12/2019   GFRNONAA 7 (L) 09/12/2019   GFRAA 9 (L) 09/12/2019      Assessment and Plan  Imaging doesn't suggest osteomyelitis. Hx sounds possibly like sciatica but not described on her imaging  Will do a trial on gabapentin 100mg    Sees pcp in 4-6 wk

## 2019-09-27 DIAGNOSIS — Z992 Dependence on renal dialysis: Secondary | ICD-10-CM | POA: Diagnosis not present

## 2019-09-27 DIAGNOSIS — N186 End stage renal disease: Secondary | ICD-10-CM | POA: Diagnosis not present

## 2019-09-28 ENCOUNTER — Other Ambulatory Visit: Payer: Self-pay

## 2019-09-28 NOTE — Patient Outreach (Signed)
[facility]) Care Management  09/28/2019  [name] [birth year] [id number]   Transition of Care Referral  Referral Date:09/12/2019 Referral [facility] Liaison Date of Admission:09/09/2019 Diagnosis:ESRD Date of Discharge:09/12/2019 [place] Medicare   Multiple attempts to establish contact with patient without success. No response from letter mailed to patient. Case is being closed at this time.    Plan: RN CM will close case at this time.   Enzo Montgomery, RN,BSN,CCM [place] Management Telephonic Care Management Coordinator Direct Phone: [phone] Toll Free: [phone] Fax: [phone]

## 2019-09-29 DIAGNOSIS — Z992 Dependence on renal dialysis: Secondary | ICD-10-CM | POA: Diagnosis not present

## 2019-09-29 DIAGNOSIS — N186 End stage renal disease: Secondary | ICD-10-CM | POA: Diagnosis not present

## 2019-10-02 DIAGNOSIS — N186 End stage renal disease: Secondary | ICD-10-CM | POA: Diagnosis not present

## 2019-10-02 DIAGNOSIS — Z992 Dependence on renal dialysis: Secondary | ICD-10-CM | POA: Diagnosis not present

## 2019-10-04 ENCOUNTER — Telehealth: Payer: Self-pay | Admitting: *Deleted

## 2019-10-04 DIAGNOSIS — N186 End stage renal disease: Secondary | ICD-10-CM | POA: Diagnosis not present

## 2019-10-04 DIAGNOSIS — Z992 Dependence on renal dialysis: Secondary | ICD-10-CM | POA: Diagnosis not present

## 2019-10-04 NOTE — Telephone Encounter (Signed)
Patient sister called to report that she is still having sever pain and the Gabapentin is not working. She wants to know if she should be seen here again. She gave Bethena Roys [phone] as her call back. Advised patient that since we are not treating the patient for an infection and she is not following up her she should contact her PCP or Ortho if she is still having pain.

## 2019-10-06 DIAGNOSIS — N186 End stage renal disease: Secondary | ICD-10-CM | POA: Diagnosis not present

## 2019-10-06 DIAGNOSIS — Z992 Dependence on renal dialysis: Secondary | ICD-10-CM | POA: Diagnosis not present

## 2019-10-09 DIAGNOSIS — M5430 Sciatica, unspecified side: Secondary | ICD-10-CM | POA: Diagnosis not present

## 2019-10-09 DIAGNOSIS — M545 Low back pain: Secondary | ICD-10-CM | POA: Diagnosis not present

## 2019-10-09 DIAGNOSIS — Z992 Dependence on renal dialysis: Secondary | ICD-10-CM | POA: Diagnosis not present

## 2019-10-09 DIAGNOSIS — N186 End stage renal disease: Secondary | ICD-10-CM | POA: Diagnosis not present

## 2019-10-09 DIAGNOSIS — E1122 Type 2 diabetes mellitus with diabetic chronic kidney disease: Secondary | ICD-10-CM | POA: Diagnosis not present

## 2019-10-11 DIAGNOSIS — N186 End stage renal disease: Secondary | ICD-10-CM | POA: Diagnosis not present

## 2019-10-11 DIAGNOSIS — Z992 Dependence on renal dialysis: Secondary | ICD-10-CM | POA: Diagnosis not present

## 2019-10-12 DIAGNOSIS — A084 Viral intestinal infection, unspecified: Secondary | ICD-10-CM | POA: Diagnosis not present

## 2019-10-13 DIAGNOSIS — N186 End stage renal disease: Secondary | ICD-10-CM | POA: Diagnosis not present

## 2019-10-13 DIAGNOSIS — Z992 Dependence on renal dialysis: Secondary | ICD-10-CM | POA: Diagnosis not present

## 2019-10-16 DIAGNOSIS — Z992 Dependence on renal dialysis: Secondary | ICD-10-CM | POA: Diagnosis not present

## 2019-10-16 DIAGNOSIS — N186 End stage renal disease: Secondary | ICD-10-CM | POA: Diagnosis not present

## 2019-10-18 DIAGNOSIS — N186 End stage renal disease: Secondary | ICD-10-CM | POA: Diagnosis not present

## 2019-10-18 DIAGNOSIS — Z992 Dependence on renal dialysis: Secondary | ICD-10-CM | POA: Diagnosis not present

## 2019-10-20 ENCOUNTER — Inpatient Hospital Stay (HOSPITAL_COMMUNITY): Payer: Medicare PPO

## 2019-10-20 ENCOUNTER — Emergency Department (HOSPITAL_COMMUNITY): Payer: Medicare PPO

## 2019-10-20 ENCOUNTER — Inpatient Hospital Stay (HOSPITAL_COMMUNITY)
Admission: EM | Admit: 2019-10-20 | Discharge: 2019-11-01 | DRG: 064 | Disposition: A | Discharge status: Rehabilitation Facility | Payer: Medicare PPO | Attending: Internal Medicine | Admitting: Internal Medicine

## 2019-10-20 ENCOUNTER — Other Ambulatory Visit: Payer: Self-pay

## 2019-10-20 DIAGNOSIS — K7290 Hepatic failure, unspecified without coma: Secondary | ICD-10-CM | POA: Diagnosis not present

## 2019-10-20 DIAGNOSIS — I953 Hypotension of hemodialysis: Secondary | ICD-10-CM | POA: Diagnosis not present

## 2019-10-20 DIAGNOSIS — I82621 Acute embolism and thrombosis of deep veins of right upper extremity: Secondary | ICD-10-CM | POA: Diagnosis not present

## 2019-10-20 DIAGNOSIS — G061 Intraspinal abscess and granuloma: Secondary | ICD-10-CM | POA: Diagnosis not present

## 2019-10-20 DIAGNOSIS — M4626 Osteomyelitis of vertebra, lumbar region: Secondary | ICD-10-CM | POA: Diagnosis not present

## 2019-10-20 DIAGNOSIS — R531 Weakness: Secondary | ICD-10-CM | POA: Diagnosis not present

## 2019-10-20 DIAGNOSIS — R600 Localized edema: Secondary | ICD-10-CM | POA: Diagnosis not present

## 2019-10-20 DIAGNOSIS — M7062 Trochanteric bursitis, left hip: Secondary | ICD-10-CM | POA: Diagnosis not present

## 2019-10-20 DIAGNOSIS — Z833 Family history of diabetes mellitus: Secondary | ICD-10-CM

## 2019-10-20 DIAGNOSIS — R7881 Bacteremia: Secondary | ICD-10-CM

## 2019-10-20 DIAGNOSIS — R471 Dysarthria and anarthria: Secondary | ICD-10-CM | POA: Diagnosis not present

## 2019-10-20 DIAGNOSIS — E1122 Type 2 diabetes mellitus with diabetic chronic kidney disease: Secondary | ICD-10-CM | POA: Diagnosis present

## 2019-10-20 DIAGNOSIS — F329 Major depressive disorder, single episode, unspecified: Secondary | ICD-10-CM | POA: Diagnosis not present

## 2019-10-20 DIAGNOSIS — Z789 Other specified health status: Secondary | ICD-10-CM | POA: Diagnosis not present

## 2019-10-20 DIAGNOSIS — R0989 Other specified symptoms and signs involving the circulatory and respiratory systems: Secondary | ICD-10-CM | POA: Diagnosis not present

## 2019-10-20 DIAGNOSIS — M545 Low back pain: Secondary | ICD-10-CM | POA: Diagnosis not present

## 2019-10-20 DIAGNOSIS — G8190 Hemiplegia, unspecified affecting unspecified side: Secondary | ICD-10-CM | POA: Diagnosis not present

## 2019-10-20 DIAGNOSIS — I361 Nonrheumatic tricuspid (valve) insufficiency: Secondary | ICD-10-CM | POA: Diagnosis not present

## 2019-10-20 DIAGNOSIS — E785 Hyperlipidemia, unspecified: Secondary | ICD-10-CM | POA: Diagnosis present

## 2019-10-20 DIAGNOSIS — M19042 Primary osteoarthritis, left hand: Secondary | ICD-10-CM | POA: Diagnosis present

## 2019-10-20 DIAGNOSIS — E7220 Disorder of urea cycle metabolism, unspecified: Secondary | ICD-10-CM | POA: Diagnosis not present

## 2019-10-20 DIAGNOSIS — D631 Anemia in chronic kidney disease: Secondary | ICD-10-CM | POA: Diagnosis present

## 2019-10-20 DIAGNOSIS — B965 Pseudomonas (aeruginosa) (mallei) (pseudomallei) as the cause of diseases classified elsewhere: Secondary | ICD-10-CM

## 2019-10-20 DIAGNOSIS — I639 Cerebral infarction, unspecified: Principal | ICD-10-CM | POA: Diagnosis present

## 2019-10-20 DIAGNOSIS — Z79899 Other long term (current) drug therapy: Secondary | ICD-10-CM

## 2019-10-20 DIAGNOSIS — I8290 Acute embolism and thrombosis of unspecified vein: Secondary | ICD-10-CM | POA: Diagnosis not present

## 2019-10-20 DIAGNOSIS — R410 Disorientation, unspecified: Secondary | ICD-10-CM | POA: Diagnosis not present

## 2019-10-20 DIAGNOSIS — M549 Dorsalgia, unspecified: Secondary | ICD-10-CM | POA: Diagnosis present

## 2019-10-20 DIAGNOSIS — E119 Type 2 diabetes mellitus without complications: Secondary | ICD-10-CM | POA: Diagnosis not present

## 2019-10-20 DIAGNOSIS — R4182 Altered mental status, unspecified: Secondary | ICD-10-CM | POA: Diagnosis not present

## 2019-10-20 DIAGNOSIS — M19041 Primary osteoarthritis, right hand: Secondary | ICD-10-CM | POA: Diagnosis present

## 2019-10-20 DIAGNOSIS — E46 Unspecified protein-calorie malnutrition: Secondary | ICD-10-CM | POA: Diagnosis present

## 2019-10-20 DIAGNOSIS — I10 Essential (primary) hypertension: Secondary | ICD-10-CM | POA: Diagnosis not present

## 2019-10-20 DIAGNOSIS — D649 Anemia, unspecified: Secondary | ICD-10-CM | POA: Diagnosis not present

## 2019-10-20 DIAGNOSIS — Z66 Do not resuscitate: Secondary | ICD-10-CM | POA: Diagnosis not present

## 2019-10-20 DIAGNOSIS — G253 Myoclonus: Secondary | ICD-10-CM | POA: Diagnosis not present

## 2019-10-20 DIAGNOSIS — I38 Endocarditis, valve unspecified: Secondary | ICD-10-CM

## 2019-10-20 DIAGNOSIS — Z8739 Personal history of other diseases of the musculoskeletal system and connective tissue: Secondary | ICD-10-CM | POA: Diagnosis not present

## 2019-10-20 DIAGNOSIS — I120 Hypertensive chronic kidney disease with stage 5 chronic kidney disease or end stage renal disease: Secondary | ICD-10-CM | POA: Diagnosis present

## 2019-10-20 DIAGNOSIS — E1121 Type 2 diabetes mellitus with diabetic nephropathy: Secondary | ICD-10-CM | POA: Diagnosis not present

## 2019-10-20 DIAGNOSIS — R1011 Right upper quadrant pain: Secondary | ICD-10-CM | POA: Diagnosis not present

## 2019-10-20 DIAGNOSIS — I340 Nonrheumatic mitral (valve) insufficiency: Secondary | ICD-10-CM | POA: Diagnosis not present

## 2019-10-20 DIAGNOSIS — K6389 Other specified diseases of intestine: Secondary | ICD-10-CM | POA: Diagnosis not present

## 2019-10-20 DIAGNOSIS — L89150 Pressure ulcer of sacral region, unstageable: Secondary | ICD-10-CM | POA: Diagnosis not present

## 2019-10-20 DIAGNOSIS — Z8249 Family history of ischemic heart disease and other diseases of the circulatory system: Secondary | ICD-10-CM | POA: Diagnosis not present

## 2019-10-20 DIAGNOSIS — I330 Acute and subacute infective endocarditis: Secondary | ICD-10-CM | POA: Diagnosis not present

## 2019-10-20 DIAGNOSIS — K5981 Ogilvie syndrome: Secondary | ICD-10-CM | POA: Diagnosis not present

## 2019-10-20 DIAGNOSIS — Z20828 Contact with and (suspected) exposure to other viral communicable diseases: Secondary | ICD-10-CM | POA: Diagnosis present

## 2019-10-20 DIAGNOSIS — R64 Cachexia: Secondary | ICD-10-CM | POA: Diagnosis not present

## 2019-10-20 DIAGNOSIS — I808 Phlebitis and thrombophlebitis of other sites: Secondary | ICD-10-CM | POA: Diagnosis present

## 2019-10-20 DIAGNOSIS — G9341 Metabolic encephalopathy: Secondary | ICD-10-CM | POA: Diagnosis not present

## 2019-10-20 DIAGNOSIS — N185 Chronic kidney disease, stage 5: Secondary | ICD-10-CM | POA: Diagnosis not present

## 2019-10-20 DIAGNOSIS — Z7982 Long term (current) use of aspirin: Secondary | ICD-10-CM | POA: Diagnosis not present

## 2019-10-20 DIAGNOSIS — I6322 Cerebral infarction due to unspecified occlusion or stenosis of basilar arteries: Secondary | ICD-10-CM | POA: Diagnosis not present

## 2019-10-20 DIAGNOSIS — K5901 Slow transit constipation: Secondary | ICD-10-CM | POA: Diagnosis not present

## 2019-10-20 DIAGNOSIS — N179 Acute kidney failure, unspecified: Secondary | ICD-10-CM | POA: Diagnosis not present

## 2019-10-20 DIAGNOSIS — I058 Other rheumatic mitral valve diseases: Secondary | ICD-10-CM | POA: Diagnosis not present

## 2019-10-20 DIAGNOSIS — Z8619 Personal history of other infectious and parasitic diseases: Secondary | ICD-10-CM | POA: Diagnosis not present

## 2019-10-20 DIAGNOSIS — Z992 Dependence on renal dialysis: Secondary | ICD-10-CM

## 2019-10-20 DIAGNOSIS — N186 End stage renal disease: Secondary | ICD-10-CM | POA: Diagnosis not present

## 2019-10-20 DIAGNOSIS — B379 Candidiasis, unspecified: Secondary | ICD-10-CM | POA: Diagnosis not present

## 2019-10-20 DIAGNOSIS — Z7409 Other reduced mobility: Secondary | ICD-10-CM | POA: Diagnosis not present

## 2019-10-20 DIAGNOSIS — R7309 Other abnormal glucose: Secondary | ICD-10-CM | POA: Diagnosis not present

## 2019-10-20 DIAGNOSIS — I82C11 Acute embolism and thrombosis of right internal jugular vein: Secondary | ICD-10-CM | POA: Diagnosis present

## 2019-10-20 DIAGNOSIS — R011 Cardiac murmur, unspecified: Secondary | ICD-10-CM | POA: Diagnosis not present

## 2019-10-20 DIAGNOSIS — Z7902 Long term (current) use of antithrombotics/antiplatelets: Secondary | ICD-10-CM

## 2019-10-20 DIAGNOSIS — I76 Septic arterial embolism: Secondary | ICD-10-CM | POA: Diagnosis not present

## 2019-10-20 DIAGNOSIS — G8194 Hemiplegia, unspecified affecting left nondominant side: Secondary | ICD-10-CM | POA: Diagnosis present

## 2019-10-20 DIAGNOSIS — Z8673 Personal history of transient ischemic attack (TIA), and cerebral infarction without residual deficits: Secondary | ICD-10-CM | POA: Diagnosis not present

## 2019-10-20 DIAGNOSIS — G8929 Other chronic pain: Secondary | ICD-10-CM | POA: Diagnosis present

## 2019-10-20 DIAGNOSIS — N2581 Secondary hyperparathyroidism of renal origin: Secondary | ICD-10-CM | POA: Diagnosis not present

## 2019-10-20 DIAGNOSIS — I059 Rheumatic mitral valve disease, unspecified: Secondary | ICD-10-CM | POA: Diagnosis not present

## 2019-10-20 DIAGNOSIS — K6812 Psoas muscle abscess: Secondary | ICD-10-CM | POA: Diagnosis not present

## 2019-10-20 DIAGNOSIS — N250 Renal osteodystrophy: Secondary | ICD-10-CM | POA: Diagnosis not present

## 2019-10-20 DIAGNOSIS — I69354 Hemiplegia and hemiparesis following cerebral infarction affecting left non-dominant side: Secondary | ICD-10-CM | POA: Diagnosis not present

## 2019-10-20 DIAGNOSIS — G479 Sleep disorder, unspecified: Secondary | ICD-10-CM | POA: Diagnosis not present

## 2019-10-20 DIAGNOSIS — I748 Embolism and thrombosis of other arteries: Secondary | ICD-10-CM | POA: Diagnosis not present

## 2019-10-20 DIAGNOSIS — Z811 Family history of alcohol abuse and dependence: Secondary | ICD-10-CM

## 2019-10-20 DIAGNOSIS — Z6821 Body mass index (BMI) 21.0-21.9, adult: Secondary | ICD-10-CM

## 2019-10-20 DIAGNOSIS — Z794 Long term (current) use of insulin: Secondary | ICD-10-CM

## 2019-10-20 DIAGNOSIS — R569 Unspecified convulsions: Secondary | ICD-10-CM | POA: Diagnosis not present

## 2019-10-20 DIAGNOSIS — R29704 NIHSS score 4: Secondary | ICD-10-CM | POA: Diagnosis present

## 2019-10-20 DIAGNOSIS — R791 Abnormal coagulation profile: Secondary | ICD-10-CM | POA: Diagnosis not present

## 2019-10-20 DIAGNOSIS — K219 Gastro-esophageal reflux disease without esophagitis: Secondary | ICD-10-CM | POA: Diagnosis present

## 2019-10-20 DIAGNOSIS — I339 Acute and subacute endocarditis, unspecified: Secondary | ICD-10-CM | POA: Diagnosis not present

## 2019-10-20 DIAGNOSIS — T827XXA Infection and inflammatory reaction due to other cardiac and vascular devices, implants and grafts, initial encounter: Secondary | ICD-10-CM | POA: Diagnosis not present

## 2019-10-20 DIAGNOSIS — B3749 Other urogenital candidiasis: Secondary | ICD-10-CM | POA: Diagnosis not present

## 2019-10-20 DIAGNOSIS — Z4901 Encounter for fitting and adjustment of extracorporeal dialysis catheter: Secondary | ICD-10-CM | POA: Diagnosis not present

## 2019-10-20 DIAGNOSIS — I6389 Other cerebral infarction: Secondary | ICD-10-CM

## 2019-10-20 DIAGNOSIS — M4646 Discitis, unspecified, lumbar region: Secondary | ICD-10-CM | POA: Diagnosis not present

## 2019-10-20 LAB — COMPREHENSIVE METABOLIC PANEL
ALT: 16 U/L (ref 0–44)
AST: 12 U/L — ABNORMAL LOW (ref 15–41)
Albumin: 2.8 g/dL — ABNORMAL LOW (ref 3.5–5.0)
Alkaline Phosphatase: 75 U/L (ref 38–126)
Anion gap: 19 — ABNORMAL HIGH (ref 5–15)
BUN: 56 mg/dL — ABNORMAL HIGH (ref 8–23)
CO2: 23 mmol/L (ref 22–32)
Calcium: 8.7 mg/dL — ABNORMAL LOW (ref 8.9–10.3)
Chloride: 97 mmol/L — ABNORMAL LOW (ref 98–111)
Creatinine, Ser: 7.76 mg/dL — ABNORMAL HIGH (ref 0.44–1.00)
GFR calc Af Amer: 6 mL/min — ABNORMAL LOW (ref >60)
GFR calc non Af Amer: 5 mL/min — ABNORMAL LOW (ref >60)
Glucose, Bld: 139 mg/dL — ABNORMAL HIGH (ref 70–99)
Potassium: 4.4 mmol/L (ref 3.5–5.1)
Sodium: 139 mmol/L (ref 135–145)
Total Bilirubin: 0.7 mg/dL (ref 0.3–1.2)
Total Protein: 7.9 g/dL (ref 6.5–8.1)

## 2019-10-20 LAB — URINALYSIS, ROUTINE W REFLEX MICROSCOPIC
Bilirubin Urine: NEGATIVE
Glucose, UA: 50 mg/dL — AB
Hgb urine dipstick: NEGATIVE
Ketones, ur: NEGATIVE mg/dL
Leukocytes,Ua: NEGATIVE
Nitrite: NEGATIVE
Protein, ur: 100 mg/dL — AB
Specific Gravity, Urine: 1.01 (ref 1.005–1.030)
pH: 7 (ref 5.0–8.0)

## 2019-10-20 LAB — CBC WITH DIFFERENTIAL/PLATELET
Abs Immature Granulocytes: 0.2 10*3/uL — ABNORMAL HIGH (ref 0.00–0.07)
Basophils Absolute: 0 10*3/uL (ref 0.0–0.1)
Basophils Relative: 0 %
Eosinophils Absolute: 0 10*3/uL (ref 0.0–0.5)
Eosinophils Relative: 0 %
HCT: 32.8 % — ABNORMAL LOW (ref 36.0–46.0)
Hemoglobin: 9.7 g/dL — ABNORMAL LOW (ref 12.0–15.0)
Immature Granulocytes: 1 %
Lymphocytes Relative: 13 %
Lymphs Abs: 2.1 10*3/uL (ref 0.7–4.0)
MCH: 27.1 pg (ref 26.0–34.0)
MCHC: 29.6 g/dL — ABNORMAL LOW (ref 30.0–36.0)
MCV: 91.6 fL (ref 80.0–100.0)
Monocytes Absolute: 1 10*3/uL (ref 0.1–1.0)
Monocytes Relative: 6 %
Neutro Abs: 12.4 10*3/uL — ABNORMAL HIGH (ref 1.7–7.7)
Neutrophils Relative %: 80 %
Platelets: 348 10*3/uL (ref 150–400)
RBC: 3.58 MIL/uL — ABNORMAL LOW (ref 3.87–5.11)
RDW: 17.8 % — ABNORMAL HIGH (ref 11.5–15.5)
WBC: 15.7 10*3/uL — ABNORMAL HIGH (ref 4.0–10.5)
nRBC: 0 % (ref 0.0–0.2)

## 2019-10-20 LAB — SARS CORONAVIRUS 2 (TAT 6-24 HRS): SARS Coronavirus 2: NEGATIVE

## 2019-10-20 LAB — GLUCOSE, CAPILLARY
Glucose-Capillary: 123 mg/dL — ABNORMAL HIGH (ref 70–99)
Glucose-Capillary: 144 mg/dL — ABNORMAL HIGH (ref 70–99)

## 2019-10-20 LAB — PROTIME-INR
INR: 1 (ref 0.8–1.2)
Prothrombin Time: 13.2 seconds (ref 11.4–15.2)

## 2019-10-20 LAB — CBG MONITORING, ED: Glucose-Capillary: 122 mg/dL — ABNORMAL HIGH (ref 70–99)

## 2019-10-20 MED ORDER — SENNOSIDES-DOCUSATE SODIUM 8.6-50 MG PO TABS
1.0000 | ORAL_TABLET | Freq: Every evening | ORAL | Status: DC | PRN
  Filled 2019-10-20: qty 1

## 2019-10-20 MED ORDER — ASPIRIN EC 81 MG PO TBEC
81.0000 mg | DELAYED_RELEASE_TABLET | Freq: Every day | ORAL | Status: DC
  Administered 2019-10-21: 81 mg via ORAL
  Filled 2019-10-20: qty 1

## 2019-10-20 MED ORDER — SODIUM CHLORIDE 0.9 % IV SOLN
INTRAVENOUS | Status: DC
  Administered 2019-10-20: 08:00:00 via INTRAVENOUS

## 2019-10-20 MED ORDER — ISOSORBIDE MONONITRATE ER 30 MG PO TB24
30.0000 mg | ORAL_TABLET | Freq: Every day | ORAL | Status: DC
  Administered 2019-10-20 – 2019-11-01 (×12): 30 mg via ORAL
  Filled 2019-10-20 (×15): qty 1

## 2019-10-20 MED ORDER — RENA-VITE PO TABS
1.0000 | ORAL_TABLET | Freq: Every day | ORAL | Status: DC
  Administered 2019-10-20 – 2019-10-31 (×11): 1 via ORAL
  Filled 2019-10-20 (×16): qty 1

## 2019-10-20 MED ORDER — HYDRALAZINE HCL 20 MG/ML IJ SOLN: 10.0000 mg | Freq: Four times a day (QID) | INTRAMUSCULAR | Status: DC | PRN

## 2019-10-20 MED ORDER — ACETAMINOPHEN 650 MG RE SUPP: 650.0000 mg | RECTAL | Status: DC | PRN

## 2019-10-20 MED ORDER — ACETAMINOPHEN 160 MG/5ML PO SOLN: 650.0000 mg | ORAL | Status: DC | PRN

## 2019-10-20 MED ORDER — INSULIN ASPART 100 UNIT/ML ~~LOC~~ SOLN
0.0000 [IU] | Freq: Every day | SUBCUTANEOUS | Status: DC
  Administered 2019-10-25: 4 [IU] via SUBCUTANEOUS
  Administered 2019-10-30: 22:00:00 2 [IU] via SUBCUTANEOUS

## 2019-10-20 MED ORDER — HEPARIN SODIUM (PORCINE) 5000 UNIT/ML IJ SOLN
5000.0000 [IU] | Freq: Three times a day (TID) | INTRAMUSCULAR | Status: DC
  Administered 2019-10-20 – 2019-10-22 (×6): 5000 [IU] via SUBCUTANEOUS
  Filled 2019-10-20 (×5): qty 1

## 2019-10-20 MED ORDER — STROKE: EARLY STAGES OF RECOVERY BOOK
Freq: Once | Status: AC
  Administered 2019-10-20: 19:00:00
  Filled 2019-10-20 (×2): qty 1

## 2019-10-20 MED ORDER — ACETAMINOPHEN 325 MG PO TABS
650.0000 mg | ORAL_TABLET | ORAL | Status: DC | PRN
  Administered 2019-10-20 – 2019-10-31 (×15): 650 mg via ORAL
  Filled 2019-10-20 (×14): qty 2

## 2019-10-20 MED ORDER — ASPIRIN 81 MG PO CHEW
324.0000 mg | CHEWABLE_TABLET | Freq: Once | ORAL | Status: AC
  Administered 2019-10-20: 324 mg via ORAL
  Filled 2019-10-20: qty 4

## 2019-10-20 MED ORDER — INSULIN ASPART 100 UNIT/ML ~~LOC~~ SOLN
0.0000 [IU] | Freq: Three times a day (TID) | SUBCUTANEOUS | Status: DC
  Administered 2019-10-21: 2 [IU] via SUBCUTANEOUS
  Administered 2019-10-21 – 2019-10-22 (×3): 1 [IU] via SUBCUTANEOUS
  Administered 2019-10-22: 3 [IU] via SUBCUTANEOUS
  Administered 2019-10-23: 2 [IU] via SUBCUTANEOUS
  Administered 2019-10-24: 1 [IU] via SUBCUTANEOUS
  Administered 2019-10-25 (×2): 2 [IU] via SUBCUTANEOUS
  Administered 2019-10-26: 1 [IU] via SUBCUTANEOUS
  Administered 2019-10-27 (×2): 2 [IU] via SUBCUTANEOUS
  Administered 2019-10-28: 1 [IU] via SUBCUTANEOUS
  Administered 2019-10-29 (×2): 5 [IU] via SUBCUTANEOUS
  Administered 2019-10-30: 2 [IU] via SUBCUTANEOUS
  Administered 2019-10-30: 3 [IU] via SUBCUTANEOUS
  Administered 2019-10-31: 1 [IU] via SUBCUTANEOUS
  Administered 2019-10-31 (×2): 2 [IU] via SUBCUTANEOUS
  Administered 2019-11-01: 1 [IU] via SUBCUTANEOUS
  Filled 2019-10-20: qty 1

## 2019-10-20 MED ORDER — SODIUM CHLORIDE 0.9 % IV SOLN
INTRAVENOUS | Status: DC
  Administered 2019-10-31: 21:00:00 via INTRAVENOUS

## 2019-10-20 MED ORDER — FLUTICASONE PROPIONATE 50 MCG/ACT NA SUSP
1.0000 | Freq: Every day | NASAL | Status: DC | PRN
  Filled 2019-10-20: qty 16

## 2019-10-20 MED ORDER — ACETAMINOPHEN 500 MG PO TABS: 1000.0000 mg | ORAL_TABLET | Freq: Once | ORAL | Status: DC

## 2019-10-20 MED ORDER — CALCIUM CARBONATE ANTACID 500 MG PO CHEW: 1.0000 | CHEWABLE_TABLET | Freq: Two times a day (BID) | ORAL | Status: DC

## 2019-10-20 MED ORDER — CHLORHEXIDINE GLUCONATE CLOTH 2 % EX PADS
6.0000 | MEDICATED_PAD | Freq: Every day | CUTANEOUS | Status: DC
  Administered 2019-10-20 – 2019-10-24 (×4): 6 via TOPICAL

## 2019-10-20 MED ORDER — DOXERCALCIFEROL 4 MCG/2ML IV SOLN
1.5000 ug | INTRAVENOUS | Status: DC
  Administered 2019-10-23 – 2019-10-30 (×2): 1.5 ug via INTRAVENOUS
  Filled 2019-10-20 (×5): qty 2

## 2019-10-20 MED ORDER — CALCIUM CARBONATE ANTACID 500 MG PO CHEW
1.0000 | CHEWABLE_TABLET | ORAL | Status: DC
  Administered 2019-10-20 – 2019-10-31 (×17): 200 mg via ORAL
  Filled 2019-10-20 (×18): qty 1

## 2019-10-20 MED ORDER — GABAPENTIN 100 MG PO CAPS
100.0000 mg | ORAL_CAPSULE | Freq: Every day | ORAL | Status: DC
  Administered 2019-10-20 – 2019-10-31 (×11): 100 mg via ORAL
  Filled 2019-10-20 (×12): qty 1

## 2019-10-20 MED ORDER — CLOPIDOGREL BISULFATE 75 MG PO TABS
75.0000 mg | ORAL_TABLET | Freq: Every day | ORAL | Status: DC
  Administered 2019-10-20 – 2019-10-21 (×2): 75 mg via ORAL
  Filled 2019-10-20 (×3): qty 1

## 2019-10-20 MED ORDER — ASPIRIN EC 81 MG PO TBEC: 81.0000 mg | DELAYED_RELEASE_TABLET | Freq: Every day | ORAL | Status: DC

## 2019-10-20 NOTE — Consult Note (Signed)
[facility] Renal Consultation Note  Requesting MD: Orson Eva, MD  Indication for Consultation:  ESRD  Chief complaint: Left-sided weakness and difficulty ambulating  HPI:  [name] is a 68 y.o. female with a history of end-stage renal disease, hypertension, diabetes mellitus type 2, GERD, and prior CVA who presented from [facility] with acute ischemic stroke.  She had presented there with left-sided weakness and difficulty ambulating which she noted at 4 AM on 1127 when she awoke to get ready for dialysis she was transferred for neurology eval.  Nephrology is consulted for assistance with management of hemodialysis she is normally patient at [facility] with prescription as below.  Note that she had been considering a transition to peritoneal dialysis per the dialysis unit; pt confirms.  They also indicate that her blood pressure drops easily with ultrafiltration.  They indicate that she has had multiple failed accesses and is dialyzing currently with a tunneled catheter; pt confirms no access in arms.  She left at her dry weight her last treatment on 11/25.  MRI without contrast with acute right basal ganglia infarcts and chronic ischemia with multiple old infarcts.  She was outside the window for tPA.  She states leg weakness getting better.  Wasn't able to walk this AM.  Has had back pain last several months.  Denies shortness of breath or n/v/d.  Spoke with her son as well who is at bedside  PMHx:   Past Medical History:  Diagnosis Date  . Arthritis    hands  . Constipation   . Diabetes mellitus without complication (HCC)    Type II  . ESRD (end stage renal disease) ([place])     M/W/F- Hemodialysis  . GERD (gastroesophageal reflux disease)   . Headache   . Hyperlipidemia   . Hypertension   . Irregular heart rate   . Stroke [facility])    TIA - approx 2010- no residual    Past Surgical History:  Procedure Laterality Date  . ABDOMINAL HYSTERECTOMY    . AORTIC ARCH  ANGIOGRAPHY N/A 03/28/2019   Procedure: AORTIC ARCH ANGIOGRAPHY;  Surgeon: Waynetta Sandy, MD;  Location: [place] CV LAB;  Service: Cardiovascular;  Laterality: N/A;  . AV FISTULA PLACEMENT Left 06/13/2018   Procedure: ARTERIOVENOUS (AV) FISTULA CREATION;  Surgeon: Rosetta Posner, MD;  Location: [place];  Service: Vascular;  Laterality: Left;  . AV FISTULA PLACEMENT Left 08/18/2018   Procedure: INSERTION OF 4-7MM X 45CM ARTERIOVENOUS (AV) GORE-TEX GRAFT LEFT  FOREARM;  Surgeon: Rosetta Posner, MD;  Location: [place];  Service: Vascular;  Laterality: Left;  . AV FISTULA PLACEMENT Right 04/06/2019   Procedure: INSERTION OF ARTERIOVENOUS (AV) GORE-TEX GRAFT RIGHT UPPER ARM;  Surgeon: Waynetta Sandy, MD;  Location: [place];  Service: Vascular;  Laterality: Right;  . AV FISTULA PLACEMENT Right 04/13/2019   Procedure: INSERTION OF ARTERIOVENOUS (AV) BOVINE  ARTEGRAFT GRAFT RIGHT UPPER EXTREMITY;  Surgeon: Serafina Mitchell, MD;  Location: [place];  Service: Vascular;  Laterality: Right;  . [place] REMOVAL Right 05/16/2019   Procedure: REMOVAL OF ARTERIOVENOUS GORETEX GRAFT ([place]) RIGHT ARM;  Surgeon: Angelia Mould, MD;  Location: [place];  Service: Vascular;  Laterality: Right;  . BASCILIC VEIN TRANSPOSITION Right 03/07/2019   Procedure: First Stage Bascilic Vein Transposition Right Arm;  Surgeon: Serafina Mitchell, MD;  Location: [place];  Service: Vascular;  Laterality: Right;  . CATARACT EXTRACTION Right 2005  . INSERTION OF DIALYSIS CATHETER N/A 08/18/2018  Procedure: INSERTION OF DIALYSIS CATHETER;  Surgeon: Rosetta Posner, MD;  Location: [facility];  Service: Vascular;  Laterality: N/A;  . INSERTION OF DIALYSIS CATHETER Right 07/25/2019   Procedure: INSERTION OF DIALYSIS CATHETER RIGHT INTERNAL JUGULAR (TUNNELED);  Surgeon: Virl Cagey, MD;  [facility]: [facility];  Service: General;  Laterality: Right;  . IR FLUORO GUIDE CV LINE RIGHT  12/06/2018  . IR PTA ADDL CENTRAL DIALYSIS SEG THRU DIALY  CIRCUIT RIGHT Right 12/06/2018  . THROMBECTOMY AND REVISION OF ARTERIOVENTOUS (AV) GORETEX  GRAFT Left 09/29/2018   Procedure: INSERTION OF ARTERIOVENTOUS (AV) GORETEX  GRAFT ARM;  Surgeon: Waynetta Sandy, MD;  Location: [place];  Service: Vascular;  Laterality: Left;  . UPPER EXTREMITY ANGIOGRAPHY Right 03/28/2019   Procedure: UPPER EXTREMITY ANGIOGRAPHY;  Surgeon: Waynetta Sandy, MD;  Location: [place] CV LAB;  Service: Cardiovascular;  Laterality: Right;  . VENOGRAM  10/27/2018   Procedure: Venogram;  Surgeon: Marty Heck, MD;  Location: [place] CV LAB;  Service: Cardiovascular;;  bilateral arm    Family Hx:  Family History  Problem Relation Age of Onset  . Heart murmur Mother   . Heart failure Mother   . Diabetes Mother   . Cirrhosis Father   . Alcoholism Father     Social History:  reports that she has never smoked. She has never used smokeless tobacco. She reports that she does not drink alcohol or use drugs.  Allergies: No Known Allergies  Medications: Prior to Admission medications   Medication Sig Start Date End Date Taking? Authorizing Provider  Accu-Chek Softclix Lancets lancets  08/17/19  Yes [provider]  Alcohol Swabs (B-D SINGLE USE SWABS REGULAR) PADS  08/07/19  Yes [provider]  amLODipine (NORVASC) 10 MG tablet Take 10 mg by mouth daily after lunch.    Yes [provider]  aspirin EC 81 MG tablet Take 81 mg by mouth daily after lunch.    Yes [provider]  calcium carbonate (TUMS - DOSED IN MG ELEMENTAL CALCIUM) 500 MG chewable tablet Chew 1 tablet by mouth 2 (two) times daily. With lunch & supper   Yes [provider]  carvedilol (COREG) 6.25 MG tablet Take 6.25 mg by mouth 2 (two) times daily. 02/01/19  Yes [provider]  clopidogrel (PLAVIX) 75 MG tablet Take 1 tablet (75 mg total) by mouth daily. 04/13/19  Yes Serafina Mitchell, MD  fluticasone (FLONASE) 50 MCG/ACT nasal  spray Place 1 spray into both nostrils daily as needed for allergies or rhinitis.    Yes [provider]  gabapentin (NEURONTIN) 100 MG capsule Take 1 capsule (100 mg total) by mouth at bedtime. 09/26/19  Yes Carlyle Basques, MD  insulin NPH-regular Human (NOVOLIN 70/30) (70-30) 100 UNIT/ML injection Inject 10-20 Units into the skin See admin instructions. Use twice daily per sliding scale: Blood sugar less than 150=12 units & Blood sugar 200 or more=20 units   Yes [provider]  methocarbamol (ROBAXIN) 500 MG tablet Take 1 tablet by mouth 3 (three) times daily as needed. 07/10/19  Yes [provider]  multivitamin (RENA-VIT) TABS tablet Take 1 tablet by mouth daily after lunch.  03/31/19  Yes [provider]  pantoprazole (PROTONIX) 40 MG tablet Take 1 tablet by mouth daily as needed.    Yes [provider]  Polyethylene Glycol 3350 (PEG 3350) 17 GM/SCOOP POWD Take 17 g by mouth daily as needed.  07/10/19  Yes [provider]  traMADol (ULTRAM) 50 MG tablet Take 50 mg by mouth 4 (four) times daily as needed. 10/09/19  Yes [provider]  isosorbide mononitrate (IMDUR) 30 MG 24 hr tablet Take 30 mg by mouth daily after lunch.     [provider]  predniSONE (DELTASONE) 20 MG tablet Take 20-40 mg by mouth as directed. Take 40mg  by mouth daily for 7 days, then takes 20mg  by mouth daily for 7 days, then stop 10/09/19   [provider]    I have reviewed the patient's current and reported prior to admission medications.  Labs:  BMP Latest Ref Rng & Units 10/20/2019 09/12/2019 09/11/2019  Glucose 70 - 99 mg/dL 139(H) 89 90  BUN 8 - 23 mg/dL 56(H) 20 29(H)  Creatinine 0.44 - 1.00 mg/dL 7.76(H) 5.44(H) 7.15(H)  Sodium 135 - 145 mmol/L 139 134(L) 138  Potassium 3.5 - 5.1 mmol/L 4.4 4.0 5.4(H)  Chloride 98 - 111 mmol/L 97(L) 90(L) 95(L)  CO2 22 - 32 mmol/L 23 27 26   Calcium 8.9 - 10.3 mg/dL 8.7(L) 8.9 8.6(L)     Urinalysis    Component Value Date/Time   COLORURINE YELLOW 10/20/2019 0954   APPEARANCEUR HAZY (A) 10/20/2019 0954   LABSPEC 1.010 10/20/2019 0954   PHURINE 7.0 10/20/2019 0954   GLUCOSEU 50 (A) 10/20/2019 0954   HGBUR NEGATIVE 10/20/2019 [place] 10/20/2019 [place] 10/20/2019 0954   PROTEINUR 100 (A) 10/20/2019 0954   NITRITE NEGATIVE 10/20/2019 0954   LEUKOCYTESUR NEGATIVE 10/20/2019 0954     ROS:  Pertinent items noted in HPI and remainder of comprehensive ROS otherwise negative.   Physical Exam: Vitals:   10/20/19 1430 10/20/19 1430  BP: (!) 178/95 (!) 178/95  Pulse: 81 82  Resp: 11 14  Temp:  98.2 F (36.8 C)  SpO2: 96% 95%     General: Adult female in bed in no acute distress at rest HEENT: Normocephalic atraumatic Eyes: Extraocular movements intact sclera anicteric Neck: Supple trachea midline Heart: Regular rate and rhythm no rubs Lungs: Clear to auscultation bilaterally normal work of breathing at rest Abdomen: Soft nontender nondistended normal bowel sounds Extremities: No edema appreciated Skin: No rash on extremities exposed Neuro: Alert and oriented x3 provides a history and follows commands.  Strength intact upper extremities bilaterally and 4 out of 5?  On her left leg which she states is limited secondary part to back pain Access right IJ tunneled catheter   Dialysis Orders:  [facility]  Schedule: MWF Time 3.5 hours  Tunneled Catheter, multiple failed access RUE and she is considering going to PD  Profile 1 - pressure drops very easily;max is pull 1.1 an hour; do not pull rinseback revaclear 300 dialyzer BF 400  DF 600  Bath 2k/2.5 ca EDW 62.5 kg and last weight of 62.5 kg on 11/25 Anemia epogen 1200 units each tx; iron 50 mg once weekly (had this week already) Bone mineral 1.5 mcg hectoral each tx   Assessment/Plan:  # ESRD - Normally MWF schedule; last tx on 11/25 - Defer usual treatment for  HD today in the setting of acute stroke - Will assess dialysis needs daily.  Would also hope to hold HD on 11/28 unless urgent to avoid dropping her blood pressure  # Acute stroke - Defer usual treatment for HD today in the setting of acute stroke - neurology has consulted  # HTN  - BP management per primary in setting of acute stroke - Avoid hypotension  with HD and deferring HD today as above  # Anemia of CKD  - Defer ESA with acute CVA  # Secondary hyperparathyroidism  - continue hectorol   Claudia Desanctis 10/20/2019, 4:25 PM

## 2019-10-20 NOTE — H&P (Signed)
History and Physical  [name] [id number] DOB: Nov 20, 1951 DOA: 10/20/2019   PCP: Manon Hilding, MD   Patient coming from: Home  Chief Complaint: left sided weakness  HPI:  [name] is a 68 y.o. female with medical history of ESRD (MWF), diabetes mellitus type 2, stroke, hypertension, hyperlipidemia presenting with left-sided weakness.  The patient was her usual self around 9 PM when she went to bed on the evening of 10/19/2019.  However when she woke up to get ready for dialysis at 4 AM on 10/20/2019, the patient noted left-sided weakness and had difficulty ambulating.  She stated that she was leaning toward the left side.  The patient endorses compliance with all her medications including aspirin and Plavix.  She had denied any fevers, chills, chest pain, shortness of breath, coughing, hemoptysis, nausea, vomiting, diarrhea, abdominal pain.  She has been compliant with dialysis.  Her last dialysis was on 10/18/2019.  She denied any headaches, visual disturbance, word finding difficulties or dysarthria. In the emergency department, the patient was afebrile hemodynamically stable saturating 100% on room air.  BMP showed a potassium 4.4, serum creatinine 7.76.  LFTs were unremarkable.  WBC 15.7 with hemoglobin 9.7 and platelets 240,000.  CT of the brain shows an acute right basal ganglia infarct.  Neurology was consulted and agreed to see the patient in consult once the patient was transferred to [facility].  Nephrology was consulted also for maintenance dialysis.    Assessment/Plan: Acute ischemic stroke Neurology Consulted Leonel Ramsay) -PT/OT evaluation -Speech therapy eval -CT brain--acute right basal ganglia infarct -MRI brain--pending -MRA brain-- -Carotid Duplex--pending -Echo--pending -LDL--pending -HbA1C--pending -Antiplatelet--ASA 81, plavix  ESRD -Patient normally dialyzes Monday, Wednesday, Friday -Nephrology has been consulted--Foster  Diabetes  mellitus type 2 -Hemoglobin A1c -NovoLog sliding scale  Essential hypertension -Holding amlodipineand carvedilol to allow for permissive hypertension -hydralazine prn SBP >220            Past Medical History:  Diagnosis Date  . Arthritis    hands  . Constipation   . Diabetes mellitus without complication (HCC)    Type II  . ESRD (end stage renal disease) ([place])     M/W/F- Hemodialysis  . GERD (gastroesophageal reflux disease)   . Headache   . Hyperlipidemia   . Hypertension   . Irregular heart rate   . Stroke [facility])    TIA - approx 2010- no residual   Past Surgical History:  Procedure Laterality Date  . ABDOMINAL HYSTERECTOMY    . AORTIC ARCH ANGIOGRAPHY N/A 03/28/2019   Procedure: AORTIC ARCH ANGIOGRAPHY;  Surgeon: Waynetta Sandy, MD;  Location: [place] CV LAB;  Service: Cardiovascular;  Laterality: N/A;  . AV FISTULA PLACEMENT Left 06/13/2018   Procedure: ARTERIOVENOUS (AV) FISTULA CREATION;  Surgeon: Rosetta Posner, MD;  Location: [place];  Service: Vascular;  Laterality: Left;  . AV FISTULA PLACEMENT Left 08/18/2018   Procedure: INSERTION OF 4-7MM X 45CM ARTERIOVENOUS (AV) GORE-TEX GRAFT LEFT  FOREARM;  Surgeon: Rosetta Posner, MD;  Location: [place];  Service: Vascular;  Laterality: Left;  . AV FISTULA PLACEMENT Right 04/06/2019   Procedure: INSERTION OF ARTERIOVENOUS (AV) GORE-TEX GRAFT RIGHT UPPER ARM;  Surgeon: Waynetta Sandy, MD;  Location: [place];  Service: Vascular;  Laterality: Right;  . AV FISTULA PLACEMENT Right 04/13/2019   Procedure: INSERTION OF ARTERIOVENOUS (AV) BOVINE  ARTEGRAFT GRAFT RIGHT UPPER EXTREMITY;  Surgeon: Serafina Mitchell, MD;  Location: [place];  Service: Vascular;  Laterality:  Right;  Marland Kitchen [place] REMOVAL Right 05/16/2019   Procedure: REMOVAL OF ARTERIOVENOUS GORETEX GRAFT ([place]) RIGHT ARM;  Surgeon: Angelia Mould, MD;  Location: [place];  Service: Vascular;  Laterality: Right;  . BASCILIC VEIN TRANSPOSITION Right 03/07/2019    Procedure: First Stage Bascilic Vein Transposition Right Arm;  Surgeon: Serafina Mitchell, MD;  Location: [place];  Service: Vascular;  Laterality: Right;  . CATARACT EXTRACTION Right 2005  . INSERTION OF DIALYSIS CATHETER N/A 08/18/2018   Procedure: INSERTION OF DIALYSIS CATHETER;  Surgeon: Rosetta Posner, MD;  Location: [place];  Service: Vascular;  Laterality: N/A;  . INSERTION OF DIALYSIS CATHETER Right 07/25/2019   Procedure: INSERTION OF DIALYSIS CATHETER RIGHT INTERNAL JUGULAR (TUNNELED);  Surgeon: Virl Cagey, MD;  [facility];  Service: General;  Laterality: Right;  . IR FLUORO GUIDE CV LINE RIGHT  12/06/2018  . IR PTA ADDL CENTRAL DIALYSIS SEG THRU DIALY CIRCUIT RIGHT Right 12/06/2018  . THROMBECTOMY AND REVISION OF ARTERIOVENTOUS (AV) GORETEX  GRAFT Left 09/29/2018   Procedure: INSERTION OF ARTERIOVENTOUS (AV) GORETEX  GRAFT ARM;  Surgeon: Waynetta Sandy, MD;  Location: [place];  Service: Vascular;  Laterality: Left;  . UPPER EXTREMITY ANGIOGRAPHY Right 03/28/2019   Procedure: UPPER EXTREMITY ANGIOGRAPHY;  Surgeon: Waynetta Sandy, MD;  Location: [place] CV LAB;  Service: Cardiovascular;  Laterality: Right;  . VENOGRAM  10/27/2018   Procedure: Venogram;  Surgeon: Marty Heck, MD;  Location: [place] CV LAB;  Service: Cardiovascular;;  bilateral arm   Social History:  reports that she has never smoked. She has never used smokeless tobacco. She reports that she does not drink alcohol or use drugs.   Family History  Problem Relation Age of Onset  . Heart murmur Mother   . Heart failure Mother   . Diabetes Mother   . Cirrhosis Father   . Alcoholism Father      No Known Allergies   Prior to Admission medications   Medication Sig Start Date End Date Taking? Authorizing Provider  Accu-Chek Softclix Lancets lancets  08/17/19   [provider]  Alcohol Swabs (B-D SINGLE USE SWABS REGULAR) PADS  08/07/19   [provider]   amLODipine (NORVASC) 10 MG tablet Take 10 mg by mouth daily after lunch.     [provider]  aspirin EC 81 MG tablet Take 81 mg by mouth daily after lunch.     [provider]  calcium carbonate (TUMS - DOSED IN MG ELEMENTAL CALCIUM) 500 MG chewable tablet Chew 1 tablet by mouth 2 (two) times daily. With lunch & supper    [provider]  carvedilol (COREG) 6.25 MG tablet Take 6.25 mg by mouth 2 (two) times daily. 02/01/19   [provider]  clopidogrel (PLAVIX) 75 MG tablet Take 1 tablet (75 mg total) by mouth daily. 04/13/19   Serafina Mitchell, MD  fluticasone (FLONASE) 50 MCG/ACT nasal spray Place 1 spray into both nostrils daily as needed for allergies or rhinitis.     [provider]  gabapentin (NEURONTIN) 100 MG capsule Take 1 capsule (100 mg total) by mouth at bedtime. 09/26/19   Carlyle Basques, MD  insulin NPH-regular Human (NOVOLIN 70/30) (70-30) 100 UNIT/ML injection Inject 10-20 Units into the skin See admin instructions. Use twice daily per sliding scale: Blood sugar less than 150=12 units & Blood sugar 200 or more=20 units    [provider]  isosorbide mononitrate (IMDUR) 30 MG 24 hr tablet  Take 30 mg by mouth daily after lunch.     [provider]  multivitamin (RENA-VIT) TABS tablet Take 1 tablet by mouth daily after lunch.  03/31/19   [provider]    Review of Systems:  Constitutional:  No weight loss, night sweats, Fevers, chills, fatigue.  Head&Eyes: No headache.  No vision loss.  No eye pain or scotoma ENT:  No Difficulty swallowing,Tooth/dental problems,Sore throat,  No ear ache, post nasal drip,  Cardio-vascular:  No chest pain, Orthopnea, PND, swelling in lower extremities,  dizziness, palpitations  GI:  No  abdominal pain, nausea, vomiting, diarrhea, loss of appetite, hematochezia, melena, heartburn, indigestion, Resp:  No shortness of breath with exertion or at rest. No cough. No coughing up of  blood .No wheezing.No chest wall deformity  Skin:  no rash or lesions.  GU:  no dysuria, change in color of urine, no urgency or frequency. No flank pain.  Musculoskeletal:  No joint pain or swelling. No decreased range of motion. No back pain.  Psych:  No change in mood or affect. No depression or anxiety. Neurologic: No headache, no dysesthesia, no focal weakness, no vision loss. No syncope  Physical Exam: Vitals:   10/20/19 0529 10/20/19 0539 10/20/19 0720 10/20/19 0740  BP:  (!) 171/97 (!) 165/96 (!) 178/95  Pulse:  91 84 86  Resp:  18 14 19   Temp:  98.1 F (36.7 C)    TempSrc:  Oral    SpO2:  98% 100% 98%  Weight: 63.5 kg     Height: 5\' 7"  (1.702 m)      General:  A&O x 3, NAD, nontoxic, pleasant/cooperative Head/Eye: No conjunctival hemorrhage, no icterus, /AT, No nystagmus ENT:  No icterus,  No thrush, good dentition, no pharyngeal exudate Neck:  No masses, no lymphadenpathy, no bruits CV:  RRR, no rub, no gallop, no S3 Lung:  CTAB, good air movement, no wheeze, no rhonchi Abdomen: soft/NT, +BS, nondistended, no peritoneal signs Ext: No cyanosis, No rashes, No petechiae, No lymphangitis, No edema Neuro: CNII-XII intact, strength 4/5 strength RUE and RLE;  4-/5 strength LUE and LLE no dysmetria  Labs on Admission:  Basic Metabolic Panel: Recent Labs  Lab 10/20/19 0659  NA 139  K 4.4  CL 97*  CO2 23  GLUCOSE 139*  BUN 56*  CREATININE 7.76*  CALCIUM 8.7*   Liver Function Tests: Recent Labs  Lab 10/20/19 0659  AST 12*  ALT 16  ALKPHOS 75  BILITOT 0.7  PROT 7.9  ALBUMIN 2.8*   No results for input(s): LIPASE, AMYLASE in the last 168 hours. No results for input(s): AMMONIA in the last 168 hours. CBC: Recent Labs  Lab 10/20/19 0659  WBC 15.7*  NEUTROABS 12.4*  HGB 9.7*  HCT 32.8*  MCV 91.6  PLT 348   Coagulation Profile: Recent Labs  Lab 10/20/19 0659  INR 1.0   Cardiac Enzymes: No results for input(s): CKTOTAL, CKMB, CKMBINDEX,  TROPONINI in the last 168 hours. BNP: Invalid input(s): POCBNP CBG: No results for input(s): GLUCAP in the last 168 hours. Urine analysis:    Component Value Date/Time   COLORURINE YELLOW 07/20/2019 2100   APPEARANCEUR CLEAR 07/20/2019 2100   LABSPEC 1.012 07/20/2019 2100   PHURINE 8.0 07/20/2019 2100   GLUCOSEU NEGATIVE 07/20/2019 2100   HGBUR SMALL (A) 07/20/2019 2100   BILIRUBINUR NEGATIVE 07/20/2019 2100   KETONESUR NEGATIVE 07/20/2019 2100   PROTEINUR >=300 (A) 07/20/2019 2100   NITRITE NEGATIVE 07/20/2019 2100  LEUKOCYTESUR NEGATIVE 07/20/2019 2100   Sepsis Labs: @LABRCNTIP (procalcitonin:4,lacticidven:4) )No results found for this or any previous visit (from the past 240 hour(s)).   Radiological Exams on Admission: Ct Head Wo Contrast  Result Date: 10/20/2019 CLINICAL DATA:  Focal neuro deficit, stroke suspected. Dizziness and weakness. EXAM: CT HEAD WITHOUT CONTRAST TECHNIQUE: Contiguous axial images were obtained from the base of the skull through the vertex without intravenous contrast. COMPARISON:  09/10/2019 FINDINGS: Brain: There are acute infarcts involving the head and body of the right caudate nucleus and adjacent white matter. No intracranial hemorrhage, mass, midline shift, or extra-axial fluid collection is identified. The ventricles and sulci are within normal limits for age. Hypodensities in the cerebral white matter bilaterally are unchanged and nonspecific but compatible with mild chronic small vessel ischemic disease. Vascular: Calcified atherosclerosis at the skull base. No hyperdense vessel. Skull: No fracture or focal osseous lesion. Sinuses/Orbits: Visualized paranasal sinuses and mastoid air cells are clear. Right cataract extraction is noted. Other: None. IMPRESSION: 1. Acute right basal ganglia infarcts. 2. Mild chronic small vessel ischemic disease. Electronically Signed   By: Logan Bores M.D.   On: 10/20/2019 06:53    EKG: Independently reviewed.  Sinus, no STT change    Time spent:60 minutes Code Status:   FULL Family Communication:  No Family at bedside Disposition Plan: expect 1-2 day hospitalization Consults called: neuro--kirkpatrick, renal DVT Prophylaxis: [place] Heparin    Orson Eva, DO  [facility] Pager [phone]  If 7PM-7AM, please contact night-coverage [email or web address] Password [facility] 10/20/2019, 8:23 AM

## 2019-10-20 NOTE — ED Provider Notes (Signed)
[facility] EMERGENCY DEPARTMENT Provider Note   CSN: [id number] Arrival date & time: 10/20/19  [id number]     History   Chief Complaint Chief Complaint  Patient presents with  . Weakness  . Dizziness    HPI [name] is a 68 y.o. female.     Patient with history of end-stage renal disease on dialysis, diabetes, stroke, high blood pressure presents with left-sided weakness.  Patient went to bed around 9:00 feeling well and woke up and almost fell because the weakness on her left side.  This is new sometime throughout the evening and early morning.  Patient denies infectious symptoms.  No concerning headaches or vomiting.  Patient is on Plavix.     Past Medical History:  Diagnosis Date  . Arthritis    hands  . Constipation   . Diabetes mellitus without complication (HCC)    Type II  . ESRD (end stage renal disease) ([place])     M/W/F- Hemodialysis  . GERD (gastroesophageal reflux disease)   . Headache   . Hyperlipidemia   . Hypertension   . Irregular heart rate   . Stroke [facility])    TIA - approx 2010- no residual    Patient Active Problem List   Diagnosis Date Noted  . Hyperkalemia 09/10/2019  . End-stage renal disease on hemodialysis ([place])   . Nausea vomiting and diarrhea   . Bacteremia 07/20/2019  . ESRD needing dialysis ([place]) 05/15/2019  . Anemia of chronic renal failure 04/03/2018  . Essential hypertension 04/03/2018  . Type 2 diabetes mellitus with diabetic nephropathy ([place]) 04/03/2018    Past Surgical History:  Procedure Laterality Date  . ABDOMINAL HYSTERECTOMY    . AORTIC ARCH ANGIOGRAPHY N/A 03/28/2019   Procedure: AORTIC ARCH ANGIOGRAPHY;  Surgeon: Waynetta Sandy, MD;  Location: [place] CV LAB;  Service: Cardiovascular;  Laterality: N/A;  . AV FISTULA PLACEMENT Left 06/13/2018   Procedure: ARTERIOVENOUS (AV) FISTULA CREATION;  Surgeon: Rosetta Posner, MD;  Location: [place];  Service: Vascular;  Laterality: Left;  . AV FISTULA PLACEMENT  Left 08/18/2018   Procedure: INSERTION OF 4-7MM X 45CM ARTERIOVENOUS (AV) GORE-TEX GRAFT LEFT  FOREARM;  Surgeon: Rosetta Posner, MD;  Location: [place];  Service: Vascular;  Laterality: Left;  . AV FISTULA PLACEMENT Right 04/06/2019   Procedure: INSERTION OF ARTERIOVENOUS (AV) GORE-TEX GRAFT RIGHT UPPER ARM;  Surgeon: Waynetta Sandy, MD;  Location: [place];  Service: Vascular;  Laterality: Right;  . AV FISTULA PLACEMENT Right 04/13/2019   Procedure: INSERTION OF ARTERIOVENOUS (AV) BOVINE  ARTEGRAFT GRAFT RIGHT UPPER EXTREMITY;  Surgeon: Serafina Mitchell, MD;  Location: [place];  Service: Vascular;  Laterality: Right;  . [place] REMOVAL Right 05/16/2019   Procedure: REMOVAL OF ARTERIOVENOUS GORETEX GRAFT ([place]) RIGHT ARM;  Surgeon: Angelia Mould, MD;  Location: [place];  Service: Vascular;  Laterality: Right;  . BASCILIC VEIN TRANSPOSITION Right 03/07/2019   Procedure: First Stage Bascilic Vein Transposition Right Arm;  Surgeon: Serafina Mitchell, MD;  Location: [place];  Service: Vascular;  Laterality: Right;  . CATARACT EXTRACTION Right 2005  . INSERTION OF DIALYSIS CATHETER N/A 08/18/2018   Procedure: INSERTION OF DIALYSIS CATHETER;  Surgeon: Rosetta Posner, MD;  Location: [place];  Service: Vascular;  Laterality: N/A;  . INSERTION OF DIALYSIS CATHETER Right 07/25/2019   Procedure: INSERTION OF DIALYSIS CATHETER RIGHT INTERNAL JUGULAR (TUNNELED);  Surgeon: Virl Cagey, MD;  Location: [facility];  Service: General;  Laterality: Right;  .  IR FLUORO GUIDE CV LINE RIGHT  12/06/2018  . IR PTA ADDL CENTRAL DIALYSIS SEG THRU DIALY CIRCUIT RIGHT Right 12/06/2018  . THROMBECTOMY AND REVISION OF ARTERIOVENTOUS (AV) GORETEX  GRAFT Left 09/29/2018   Procedure: INSERTION OF ARTERIOVENTOUS (AV) GORETEX  GRAFT ARM;  Surgeon: Waynetta Sandy, MD;  Location: [place];  Service: Vascular;  Laterality: Left;  . UPPER EXTREMITY ANGIOGRAPHY Right 03/28/2019   Procedure: UPPER EXTREMITY ANGIOGRAPHY;  Surgeon: Waynetta Sandy, MD;  Location: [place] CV LAB;  Service: Cardiovascular;  Laterality: Right;  . VENOGRAM  10/27/2018   Procedure: Venogram;  Surgeon: Marty Heck, MD;  Location: [place] CV LAB;  Service: Cardiovascular;;  bilateral arm     OB History    Gravida  1   Para  1   Term  1   Preterm      AB      Living        SAB      TAB      Ectopic      Multiple      Live Births               Home Medications    Prior to Admission medications   Medication Sig Start Date End Date Taking? Authorizing Provider  Accu-Chek Softclix Lancets lancets  08/17/19   [provider]  Alcohol Swabs (B-D SINGLE USE SWABS REGULAR) PADS  08/07/19   [provider]  amLODipine (NORVASC) 10 MG tablet Take 10 mg by mouth daily after lunch.     [provider]  aspirin EC 81 MG tablet Take 81 mg by mouth daily after lunch.     [provider]  calcium carbonate (TUMS - DOSED IN MG ELEMENTAL CALCIUM) 500 MG chewable tablet Chew 1 tablet by mouth 2 (two) times daily. With lunch & supper    [provider]  carvedilol (COREG) 6.25 MG tablet Take 6.25 mg by mouth 2 (two) times daily. 02/01/19   [provider]  clopidogrel (PLAVIX) 75 MG tablet Take 1 tablet (75 mg total) by mouth daily. 04/13/19   Serafina Mitchell, MD  fluticasone (FLONASE) 50 MCG/ACT nasal spray Place 1 spray into both nostrils daily as needed for allergies or rhinitis.     [provider]  gabapentin (NEURONTIN) 100 MG capsule Take 1 capsule (100 mg total) by mouth at bedtime. 09/26/19   Carlyle Basques, MD  insulin NPH-regular Human (NOVOLIN 70/30) (70-30) 100 UNIT/ML injection Inject 10-20 Units into the skin See admin instructions. Use twice daily per sliding scale: Blood sugar less than 150=12 units & Blood sugar 200 or more=20 units    [provider]  isosorbide mononitrate (IMDUR) 30 MG 24 hr tablet Take 30 mg by mouth daily after  lunch.     [provider]  multivitamin (RENA-VIT) TABS tablet Take 1 tablet by mouth daily after lunch.  03/31/19   [provider]    Family History Family History  Problem Relation Age of Onset  . Heart murmur Mother   . Heart failure Mother   . Diabetes Mother   . Cirrhosis Father   . Alcoholism Father     Social History Social History   Tobacco Use  . Smoking status: Never Smoker  . Smokeless tobacco: Never Used  Substance Use Topics  . Alcohol use: Never    Frequency: Never  . Drug use: Never     Allergies   Patient has  no known allergies.   Review of Systems Review of Systems  Constitutional: Negative for chills and fever.  HENT: Negative for congestion.   Eyes: Negative for visual disturbance.  Respiratory: Negative for shortness of breath.   Cardiovascular: Negative for chest pain.  Gastrointestinal: Negative for abdominal pain and vomiting.  Genitourinary: Negative for dysuria and flank pain.  Musculoskeletal: Negative for back pain, neck pain and neck stiffness.  Skin: Negative for rash.  Neurological: Positive for weakness. Negative for syncope, speech difficulty, light-headedness, numbness and headaches.     Physical Exam Updated Vital Signs BP (!) 171/97 (BP Location: Left Arm)   Pulse 91   Temp 98.1 F (36.7 C) (Oral)   Resp 18   Ht 5\' 7"  (1.702 m)   Wt 63.5 kg   SpO2 98%   BMI 21.93 kg/m   Physical Exam Vitals signs and nursing note reviewed.  Constitutional:      Appearance: She is well-developed.  HENT:     Head: Normocephalic and atraumatic.  Eyes:     General:        Right eye: No discharge.        Left eye: No discharge.     Conjunctiva/sclera: Conjunctivae normal.  Neck:     Musculoskeletal: Normal range of motion and neck supple.     Trachea: No tracheal deviation.  Cardiovascular:     Rate and Rhythm: Normal rate and regular rhythm.  Pulmonary:     Effort: Pulmonary effort is normal.     Breath  sounds: Normal breath sounds.  Abdominal:     General: There is no distension.     Palpations: Abdomen is soft.     Tenderness: There is no abdominal tenderness. There is no guarding.  Skin:    General: Skin is warm.     Findings: No rash.  Neurological:     Mental Status: She is alert and oriented to person, place, and time.     Comments: Patient has weakness left arm and left leg.  Normal 5+ strength right arm and right leg.  Normal speech.  Extraocular muscle function intact.  No facial droop.  Sensation intact all extremities.      ED Treatments / Results  Labs (all labs ordered are listed, but only abnormal results are displayed) Labs Reviewed  COMPREHENSIVE METABOLIC PANEL - Abnormal; Notable for the following components:      Result Value   Chloride 97 (*)    Glucose, Bld 139 (*)    BUN 56 (*)    Creatinine, Ser 7.76 (*)    Calcium 8.7 (*)    Albumin 2.8 (*)    AST 12 (*)    GFR calc non Af Amer 5 (*)    GFR calc Af Amer 6 (*)    Anion gap 19 (*)    All other components within normal limits  CBC WITH DIFFERENTIAL/PLATELET - Abnormal; Notable for the following components:   WBC 15.7 (*)    RBC 3.58 (*)    Hemoglobin 9.7 (*)    HCT 32.8 (*)    MCHC 29.6 (*)    RDW 17.8 (*)    Neutro Abs 12.4 (*)    Abs Immature Granulocytes 0.20 (*)    All other components within normal limits  SARS CORONAVIRUS 2 (TAT 6-24 HRS)  PROTIME-INR  URINALYSIS, ROUTINE W REFLEX MICROSCOPIC    EKG None  Radiology Ct Head Wo Contrast  Result Date: 10/20/2019 CLINICAL DATA:  Focal neuro deficit, stroke  suspected. Dizziness and weakness. EXAM: CT HEAD WITHOUT CONTRAST TECHNIQUE: Contiguous axial images were obtained from the base of the skull through the vertex without intravenous contrast. COMPARISON:  09/10/2019 FINDINGS: Brain: There are acute infarcts involving the head and body of the right caudate nucleus and adjacent white matter. No intracranial hemorrhage, mass, midline shift,  or extra-axial fluid collection is identified. The ventricles and sulci are within normal limits for age. Hypodensities in the cerebral white matter bilaterally are unchanged and nonspecific but compatible with mild chronic small vessel ischemic disease. Vascular: Calcified atherosclerosis at the skull base. No hyperdense vessel. Skull: No fracture or focal osseous lesion. Sinuses/Orbits: Visualized paranasal sinuses and mastoid air cells are clear. Right cataract extraction is noted. Other: None. IMPRESSION: 1. Acute right basal ganglia infarcts. 2. Mild chronic small vessel ischemic disease. Electronically Signed   By: Logan Bores M.D.   On: 10/20/2019 06:53    Procedures Procedures (including critical care time)  Medications Ordered in ED Medications  0.9 %  sodium chloride infusion (has no administration in time range)     Initial Impression / Assessment and Plan / ED Course  I have reviewed the triage vital signs and the nursing notes.  Pertinent labs & imaging results that were available during my care of the patient were reviewed by me and considered in my medical decision making (see chart for details).       Patient presents with clinical concern for acute stroke that happened sometime since 9:00 yesterday evening.  Focal weakness discussed with neurology will perform teleevaluation.  CT scan of the head reviewed acute basal ganglia infarct.  Blood work reviewed mild leukocytosis 15, creatinine 7 which is chronic for her.  Potassium normal, hemoglobin 9.7.  Pending neurology formal recommendations however plan for admission/observation in the hospital.  Paged hospitalist and discussed admission.  The patients results and plan were reviewed and discussed.   Any x-rays performed were independently reviewed by myself.   Differential diagnosis were considered with the presenting HPI.  Medications  0.9 %  sodium chloride infusion ( Intravenous New Bag/Given 10/20/19 0742)  aspirin  chewable tablet 324 mg (has no administration in time range)    Vitals:   10/20/19 0529 10/20/19 0539 10/20/19 0720 10/20/19 0740  BP:  (!) 171/97 (!) 165/96 (!) 178/95  Pulse:  91 84 86  Resp:  18 14 19   Temp:  98.1 F (36.7 C)    TempSrc:  Oral    SpO2:  98% 100% 98%  Weight: 63.5 kg     Height: 5\' 7"  (1.702 m)       Final diagnoses:  Acute left-sided weakness  Acute ischemic stroke [facility])    Admission/ observation were discussed with the admitting physician, patient and/or family and they are comfortable with the plan.    Final Clinical Impressions(s) / ED Diagnoses   Final diagnoses:  Acute left-sided weakness  Acute ischemic stroke [facility])    ED Discharge Orders    None       Elnora Morrison, MD 10/20/19 [phone]

## 2019-10-20 NOTE — Progress Notes (Signed)
*  PRELIMINARY RESULTS* Echocardiogram 2D Echocardiogram LIMITED has been performed.  Leavy Cella 10/20/2019, 12:15 PM

## 2019-10-20 NOTE — ED Triage Notes (Addendum)
Pt was dizzyy and weak when she woke up at 4am. C/O "left side just won't work with me." Pt is scheduled to dialysis this morning. Pt reports she was last normal at 2100 last night.

## 2019-10-20 NOTE — Consult Note (Addendum)
NEURO HOSPITALIST  CONSULT   Requesting Physician: Dr. Carles Collet    Chief Complaint:  Dizzy left arm weakness  History obtained from:  Patient   HPI:                                                                                                                                         Sherol Cantarero is an 68 y.o. female  With [place] ESRD ( on dialysis M-W-F), TIA 2010, HTN, HLD, Ha, DM2 who presented to  [facility] with c/o dizziness and left arm weakness.  Per patient she went to bed about 2100 last night, at that time she was well. When she woke up about 0400 she almost fell from her left leg (" not doing what she told it to do" ,dizziness ( described as lightheaded sensation), and a HA.  Endorses plavix and ASA 81mg  daily without missing any doses. Denies CP, SOB, vision changes, ETOH use, smoking, drug abuse. Endorses numbness of the left leg.   Hospital course:  11/27/220: saw tele neurology at [facility]: acute right BG infarcts MRI: acute right basal ganglia infarcts MRA: no LVO   Date last known well: 10/19/2019 Time last known well: 2100 tPA Given: no; outside of window Modified Rankin: Rankin Score=1 NIHSS:4 left arm and leg drift, ataxia     Past Medical History:  Diagnosis Date  . Arthritis    hands  . Constipation   . Diabetes mellitus without complication (HCC)    Type II  . ESRD (end stage renal disease) ([place])     M/W/F- Hemodialysis  . GERD (gastroesophageal reflux disease)   . Headache   . Hyperlipidemia   . Hypertension   . Irregular heart rate   . Stroke [facility])    TIA - approx 2010- no residual    Past Surgical History:  Procedure Laterality Date  . ABDOMINAL HYSTERECTOMY    . AORTIC ARCH ANGIOGRAPHY N/A 03/28/2019   Procedure: AORTIC ARCH ANGIOGRAPHY;  Surgeon: Waynetta Sandy, MD;  Location: [place] [facility];  Service: Cardiovascular;  Laterality: N/A;  . AV FISTULA PLACEMENT Left 06/13/2018    Procedure: ARTERIOVENOUS (AV) FISTULA CREATION;  Surgeon: Rosetta Posner, MD;  Location: [place];  Service: Vascular;  Laterality: Left;  . AV FISTULA PLACEMENT Left 08/18/2018   Procedure: INSERTION OF 4-7MM X 45CM ARTERIOVENOUS (AV) GORE-TEX GRAFT LEFT  FOREARM;  Surgeon: Rosetta Posner, MD;  Location: [place];  Service: Vascular;  Laterality: Left;  . AV FISTULA PLACEMENT Right 04/06/2019   Procedure: INSERTION OF ARTERIOVENOUS (AV) GORE-TEX GRAFT RIGHT UPPER ARM;  Surgeon: Waynetta Sandy,  MD;  Location: [facility];  Service: Vascular;  Laterality: Right;  . AV FISTULA PLACEMENT Right 04/13/2019   Procedure: INSERTION OF ARTERIOVENOUS (AV) BOVINE  ARTEGRAFT GRAFT RIGHT UPPER EXTREMITY;  Surgeon: Serafina Mitchell, MD;  Location: [place];  Service: Vascular;  Laterality: Right;  . [place] REMOVAL Right 05/16/2019   Procedure: REMOVAL OF ARTERIOVENOUS GORETEX GRAFT ([place]) RIGHT ARM;  Surgeon: Angelia Mould, MD;  Location: [place];  Service: Vascular;  Laterality: Right;  . BASCILIC VEIN TRANSPOSITION Right 03/07/2019   Procedure: First Stage Bascilic Vein Transposition Right Arm;  Surgeon: Serafina Mitchell, MD;  Location: [place];  Service: Vascular;  Laterality: Right;  . CATARACT EXTRACTION Right 2005  . INSERTION OF DIALYSIS CATHETER N/A 08/18/2018   Procedure: INSERTION OF DIALYSIS CATHETER;  Surgeon: Rosetta Posner, MD;  Location: [place];  Service: Vascular;  Laterality: N/A;  . INSERTION OF DIALYSIS CATHETER Right 07/25/2019   Procedure: INSERTION OF DIALYSIS CATHETER RIGHT INTERNAL JUGULAR (TUNNELED);  Surgeon: Virl Cagey, MD;  Location: [facility];  Service: General;  Laterality: Right;  . IR FLUORO GUIDE CV LINE RIGHT  12/06/2018  . IR PTA ADDL CENTRAL DIALYSIS SEG THRU DIALY CIRCUIT RIGHT Right 12/06/2018  . THROMBECTOMY AND REVISION OF ARTERIOVENTOUS (AV) GORETEX  GRAFT Left 09/29/2018   Procedure: INSERTION OF ARTERIOVENTOUS (AV) GORETEX  GRAFT ARM;  Surgeon: Waynetta Sandy, MD;   Location: [place];  Service: Vascular;  Laterality: Left;  . UPPER EXTREMITY ANGIOGRAPHY Right 03/28/2019   Procedure: UPPER EXTREMITY ANGIOGRAPHY;  Surgeon: Waynetta Sandy, MD;  Location: [place] [facility];  Service: Cardiovascular;  Laterality: Right;  . VENOGRAM  10/27/2018   Procedure: Venogram;  Surgeon: Marty Heck, MD;  Location: [place] [facility];  Service: Cardiovascular;;  bilateral arm    Family History  Problem Relation Age of Onset  . Heart murmur Mother   . Heart failure Mother   . Diabetes Mother   . Cirrhosis Father   . Alcoholism Father       Social History:  reports that she has never smoked. She has never used smokeless tobacco. She reports that she does not drink alcohol or use drugs.  Allergies: No Known Allergies  Medications:                                                                                                                           Scheduled: .  stroke: mapping our early stages of recovery book   Does not apply Once  . acetaminophen  1,000 mg Oral Once  . [START ON 10/21/2019] aspirin EC  81 mg Oral QPC lunch  . calcium carbonate  1 tablet Oral 2 times per day  . clopidogrel  75 mg Oral Daily  . gabapentin  100 mg Oral QHS  . heparin  5,000 Units Subcutaneous Q8H  . insulin aspart  0-5 Units Subcutaneous QHS  . insulin aspart  0-9 Units Subcutaneous TID WC  .  isosorbide mononitrate  30 mg Oral QPC lunch  . multivitamin  1 tablet Oral QPC lunch   Continuous: . sodium chloride Stopped (10/20/19 1033)  . sodium chloride Stopped (10/20/19 1317)   [id number] **OR** acetaminophen (TYLENOL) oral liquid 160 mg/5 mL **OR** acetaminophen, fluticasone, hydrALAZINE, senna-docusate   ROS:                                                                                                                                       ROS was performed and is negative except as noted in HPI     General Examination:                                                                                                       Blood pressure (!) 178/95, pulse 82, temperature 98.2 F (36.8 C), temperature source Oral, resp. rate 14, height 5\' 7"  (1.702 m), weight 63.5 kg, SpO2 95 %.  Physical Exam  Constitutional: Appears well-developed and well-nourished.  Psych: Affect appropriate to situation Eyes: Normal external eye and conjunctiva. HENT: Normocephalic, no lesions, without obvious abnormality.   Musculoskeletal-no joint tenderness, deformity or swelling, lower back pain ( chronic)  Cardiovascular: Normal rate and regular rhythm.  Respiratory: Effort normal, non-labored breathing saturations WNL on RA GI: Soft.  No distension. There is no tenderness.  Skin: WDI  Neurological Examination Mental Status: Alert, oriented, thought content appropriate. Naming and repetition intact. Speech fluent without evidence of aphasia.  Able to follow  commands without difficulty. Cranial Nerves: II:  Visual fields grossly normal,  III,IV, VI: ptosis not present, extra-ocular motions intact bilaterally, pupils equal, round, reactive to light and accommodation V,VII: smile asymmetric with mild left weakness, facial light touch sensation normal bilaterally VIII: hearing normal bilaterally IX,X: uvula rises midline XI: bilateral shoulder shrug XII: midline tongue extension Motor: Right : Upper extremity   5/5  Left:     Upper extremity   4+/5  Lower extremity   5/5   Lower extremity   4/5 Tone and bulk:normal tone throughout; no atrophy noted Sensory: cool temp and light touch intact throughout, bilaterally Deep Tendon Reflexes: 2+ and symmetric biceps and patella Plantars: Right: downgoing   Left: downgoing Cerebellar: Poorly coordinated movements of the left consistent with weakness Gait:  deferred   Lab Results: Basic Metabolic Panel: Recent Labs  Lab 10/20/19 0659  NA 139  K 4.4  CL 97*  CO2 23  GLUCOSE 139*  BUN 56*   CREATININE 7.76*  CALCIUM 8.7*    CBC: Recent Labs  Lab 10/20/19 0659  WBC 15.7*  NEUTROABS 12.4*  HGB 9.7*  HCT 32.8*  MCV 91.6  PLT 348    CBG: Recent Labs  Lab 10/20/19 1153  GLUCAP 122*    Imaging: Dg Chest 1 View  Result Date: 10/20/2019 CLINICAL DATA:  Left-sided weakness EXAM: CHEST  1 VIEW COMPARISON:  September 09, 2019 FINDINGS: Right tunneled central line is unchanged. Low lung volumes. No new consolidation or edema. Possible trace left pleural effusion. Stable cardiomediastinal contours. IMPRESSION: Possible trace left pleural effusion. Electronically Signed   By: Macy Mis M.D.   On: 10/20/2019 11:45   Ct Head Wo Contrast  Result Date: 10/20/2019 CLINICAL DATA:  Focal neuro deficit, stroke suspected. Dizziness and weakness. EXAM: CT HEAD WITHOUT CONTRAST TECHNIQUE: Contiguous axial images were obtained from the base of the skull through the vertex without intravenous contrast. COMPARISON:  09/10/2019 FINDINGS: Brain: There are acute infarcts involving the head and body of the right caudate nucleus and adjacent white matter. No intracranial hemorrhage, mass, midline shift, or extra-axial fluid collection is identified. The ventricles and sulci are within normal limits for age. Hypodensities in the cerebral white matter bilaterally are unchanged and nonspecific but compatible with mild chronic small vessel ischemic disease. Vascular: Calcified atherosclerosis at the skull base. No hyperdense vessel. Skull: No fracture or focal osseous lesion. Sinuses/Orbits: Visualized paranasal sinuses and mastoid air cells are clear. Right cataract extraction is noted. Other: None. IMPRESSION: 1. Acute right basal ganglia infarcts. 2. Mild chronic small vessel ischemic disease. Electronically Signed   By: Logan Bores M.D.   On: 10/20/2019 06:53   Mr Angio Head Wo Contrast  Result Date: 10/20/2019 CLINICAL DATA:  Left-sided weakness. EXAM: MRI HEAD WITHOUT CONTRAST MRA HEAD  WITHOUT CONTRAST TECHNIQUE: Multiplanar, multiecho pulse sequences of the brain and surrounding structures were obtained without intravenous contrast. Angiographic images of the head were obtained using MRA technique without contrast. COMPARISON:  Head CT 10/20/2019 and MRI 07/19/2019 FINDINGS: MRI HEAD FINDINGS Brain: As seen on today's head CT, they are multiple acute infarcts involving the right basal ganglia and adjacent white matter. A few subcentimeter foci of mild trace diffusion weighted signal hyperintensity in the left parietal and posterior left frontal white matter are without reduced ADC and likely reflect subacute to chronic ischemia. A 4 mm cortical infarct in the left postcentral gyrus is new from the prior MRI but not acute. T2 hyperintensities elsewhere in the cerebral white matter bilaterally are unchanged and nonspecific but compatible with mild chronic small vessel ischemic disease. Small chronic infarcts in the left cerebellum and left thalamus are unchanged. Mild cerebral atrophy is within normal limits for age. No intracranial hemorrhage, mass, midline shift, or extra-axial fluid collection is identified. Vascular: Major intracranial vascular flow voids are preserved. Skull and upper cervical spine: No suspicious marrow lesion. Sinuses/Orbits: Right cataract extraction. No evidence of significant inflammatory disease in the paranasal sinuses. Left petrous apex effusion. Other: None. MRA HEAD FINDINGS The visualized distal vertebral arteries are patent to the basilar and codominant. Patent right PICA, bilateral AICA, and bilateral SCA origins are visualized. The basilar artery is widely patent. There is a large left posterior communicating artery with hypoplastic or absent left P1 segment, a normal variant. Both PCAs are patent without evidence of significant proximal stenosis. The internal carotid arteries are patent from skull base to carotid termini without definite stenosis. Focally  decreased signal in the right petrous ICA is likely  artifactual related to pneumatization of the right petrous apex. ACAs and MCAs are patent without evidence of proximal branch occlusion or significant proximal stenosis. No aneurysm is identified. IMPRESSION: 1. Acute right basal ganglia infarcts. 2. Chronic ischemia with multiple old infarcts as above. 3. Negative head MRA. Electronically Signed   By: Logan Bores M.D.   On: 10/20/2019 11:46   Mr Brain Wo Contrast  Result Date: 10/20/2019 CLINICAL DATA:  Left-sided weakness. EXAM: MRI HEAD WITHOUT CONTRAST MRA HEAD WITHOUT CONTRAST TECHNIQUE: Multiplanar, multiecho pulse sequences of the brain and surrounding structures were obtained without intravenous contrast. Angiographic images of the head were obtained using MRA technique without contrast. COMPARISON:  Head CT 10/20/2019 and MRI 07/19/2019 FINDINGS: MRI HEAD FINDINGS Brain: As seen on today's head CT, they are multiple acute infarcts involving the right basal ganglia and adjacent white matter. A few subcentimeter foci of mild trace diffusion weighted signal hyperintensity in the left parietal and posterior left frontal white matter are without reduced ADC and likely reflect subacute to chronic ischemia. A 4 mm cortical infarct in the left postcentral gyrus is new from the prior MRI but not acute. T2 hyperintensities elsewhere in the cerebral white matter bilaterally are unchanged and nonspecific but compatible with mild chronic small vessel ischemic disease. Small chronic infarcts in the left cerebellum and left thalamus are unchanged. Mild cerebral atrophy is within normal limits for age. No intracranial hemorrhage, mass, midline shift, or extra-axial fluid collection is identified. Vascular: Major intracranial vascular flow voids are preserved. Skull and upper cervical spine: No suspicious marrow lesion. Sinuses/Orbits: Right cataract extraction. No evidence of significant inflammatory disease in the  paranasal sinuses. Left petrous apex effusion. Other: None. MRA HEAD FINDINGS The visualized distal vertebral arteries are patent to the basilar and codominant. Patent right PICA, bilateral AICA, and bilateral SCA origins are visualized. The basilar artery is widely patent. There is a large left posterior communicating artery with hypoplastic or absent left P1 segment, a normal variant. Both PCAs are patent without evidence of significant proximal stenosis. The internal carotid arteries are patent from skull base to carotid termini without definite stenosis. Focally decreased signal in the right petrous ICA is likely artifactual related to pneumatization of the right petrous apex. ACAs and MCAs are patent without evidence of proximal branch occlusion or significant proximal stenosis. No aneurysm is identified. IMPRESSION: 1. Acute right basal ganglia infarcts. 2. Chronic ischemia with multiple old infarcts as above. 3. Negative head MRA. Electronically Signed   By: Logan Bores M.D.   On: 10/20/2019 11:46       Laurey Morale, MSN, NP-C [facility] [phone]  10/20/2019, 3:51 PM   Attending physician note to follow with Assessment and plan . I have seen the patient and reviewed the above note.  Assessment: Alaia Kapoor is an 68 y.o. female  With [place] ESRD ( on dialysis M-W-F), TIA 2010, HTN, HLD, DM2 who presents with right basal ganglia infarcts.  The distribution is unusual and she has been admitted for further work-up and physical therapy.  Stroke Risk Factors - diabetes mellitus, hyperlipidemia and hypertension   Recommendations: -- BP goal : Permissive HTN upto 220/110 mmHg (for 24-48 post admission )  -- high intensity statin for LDL > 70 --carotid dopplers --Echocardiogram -- ASA and plavix -- HgbA1c, fasting lipid panel -- PT consult, OT consult, Speech consult --Telemetry monitoring --Frequent neuro checks --Stroke swallow screen  --please page stroke NP  Or   PA  Or MD from 8am -  4 pm  as this patient from this time will be  followed by the stroke.   You can look them up on [email or web address]  Password [email or web address]  Roland Rack, MD [facility] [phone]  If 7pm- 7am, please page neurology on call as listed in [place].

## 2019-10-20 NOTE — ED Notes (Signed)
Report given to Lonnie with [facility] 

## 2019-10-20 NOTE — Consult Note (Signed)
[facility] TeleNeurology Consult Services  Stat Consult  Date of Service:   10/20/2019 07:08:37  Impression:     .  cva-right basal ganglia infarct,acute.  Comments/Sign-Out: She needs to be admitted for further workup with brain MRI, carotids and echocardiogram.antiplatelet and statin therapy may be started if no contraindications.  CT HEAD: Reviewed  Metrics: [facility] Notification Time: 10/20/2019 07:07:09 Stamp Time: 10/20/2019 07:08:37 Callback Response Time: 10/20/2019 07:10:54 Video Start Time: 10/20/2019 07:27:34 Video End Time: 10/20/2019 07:40:43  Our recommendations are outlined below.  Recommendations:     .  Antiplatelet Therapy  Imaging Studies:     .  MRI Head     .  Carotid Dopplers     .  Echocardiogram - Transthoracic Echocardiogram  Therapies:     .  Physical Therapy, Occupational Therapy, Speech Therapy Assessment When Applicable  Disposition: Neurology Follow Up Recommended  Sign Out:     .  Discussed with Emergency Department Provider  ----------------------------------------------------------------------------------------------------  Chief Complaint: left sided weakness  History of Present Illness: Patient is a 68 year old Female.  68 year old female with past medical history significant for hypertension, diabetes, end-stage renal disease, lower back pain, old stroke which affected the left side in the past currently on Plavix therapy. The patient was normal when she went to bed last night at about 9 PM. She woke up at 4 o'clock in the morning for dialysis and realize that she was weak on the left side. She subsequently came to the hospital for further workup. She has mild weakness in the left arm and leg with ataxia but no sensory deficits or vision problems. Her speech is intact. No facial asymmetry is noted. CAT scan of the head issuing an acute right basal ganglia infarct. She is outside the window for TPA. She does not have  any signs of large vessel occlusion. She needs to be admitted for further workup with brain MRI, carotids and echocardiogram.antiplatelet and statin therapy may be started if no contraindications.   Past Medical History:     . Hypertension     . Diabetes Mellitus     . Stroke     . There is NO history of Hyperlipidemia     . There is NO history of Atrial Fibrillation     . There is NO history of Coronary Artery Disease  Anticoagulant use:  No  Antiplatelet use: plavix Examination: BP(165/96), Pulse(91), Blood Glucose(89) 1A: Level of Consciousness - Alert; keenly responsive + 0 1B: Ask Month and Age - Both Questions Right + 0 1C: Blink Eyes & Squeeze Hands - Performs Both Tasks + 0 2: Test Horizontal Extraocular Movements - Normal + 0 3: Test Visual Fields - No Visual Loss + 0 4: Test Facial Palsy (Use Grimace if Obtunded) - Normal symmetry + 0 5A: Test Left Arm Motor Drift - Drift, but doesn't hit bed + 1 5B: Test Right Arm Motor Drift - No Drift for 10 Seconds + 0 6A: Test Left Leg Motor Drift - Drift, but doesn't hit bed + 1 6B: Test Right Leg Motor Drift - No Drift for 5 Seconds + 0 7: Test Limb Ataxia (FNF/Heel-Shin) - Ataxia in 1 Limb + 1 8: Test Sensation - Normal; No sensory loss + 0 9: Test Language/Aphasia - Normal; No aphasia + 0 10: Test Dysarthria - Normal + 0 11: Test Extinction/Inattention - No abnormality + 0  NIHSS Score: 3  Patient/Family was informed the Neurology Consult would happen via TeleHealth consult  by way of interactive audio and video telecommunications and consented to receiving care in this manner.  Due to the immediate potential for life-threatening deterioration due to underlying acute neurologic illness, I spent 35 minutes providing critical care. This time includes time for face to face visit via telemedicine, review of medical records, imaging studies and discussion of findings with providers, the patient and/or family.   Dr Lamount Cohen  [place] [facility]   [facility] [phone]   Case [id number]

## 2019-10-20 NOTE — ED Notes (Signed)
ED TO INPATIENT HANDOFF REPORT  ED Nurse Name and Phone #: Emiliano Dyer [phone]  S Name/Age/Gender [name] 68 y.o. female Room/Bed: APA11/APA11  Code Status   Code Status: Full Code  Home/SNF/Other Home Patient oriented to: self, place, time and situation Is this baseline? Yes   Triage Complete: Triage complete  Chief Complaint Dizzyness  Triage Note Pt was dizzyy and weak when she woke up at 4am. C/O "left side just won't work with me." Pt is scheduled to dialysis this morning. Pt reports she was last normal at 2100 last night.   Allergies No Known Allergies  Level of Care/Admitting Diagnosis ED Disposition    ED Disposition Condition [facility] Area: [place] [[id number]]  Level of Care: Telemetry Medical [104]  Covid Evaluation: N/A  [facility]: [id number]  Admitting Physician: TAT, DAVID [email or web address]  Attending Physician: TAT, DAVID [[id number]]  Estimated length of stay: past midnight tomorrow  Certification:: I certify this patient will need inpatient services for at least 2 midnights  PT Class (Do Not Modify): Inpatient [101]  PT Acc Code (Do Not Modify): Private [1]       B Medical/Surgery History Past Medical History:  Diagnosis Date  . Arthritis    hands  . Constipation   . Diabetes mellitus without complication (HCC)    Type II  . ESRD (end stage renal disease) ([place])     M/W/F- Hemodialysis  . GERD (gastroesophageal reflux disease)   . Headache   . Hyperlipidemia   . Hypertension   . Irregular heart rate   . Stroke [facility])    TIA - approx 2010- no residual   Past Surgical History:  Procedure Laterality Date  . ABDOMINAL HYSTERECTOMY    . AORTIC ARCH ANGIOGRAPHY N/A 03/28/2019   Procedure: AORTIC ARCH ANGIOGRAPHY;  Surgeon: Waynetta Sandy, MD;  Location: [place] [facility];  Service: Cardiovascular;  Laterality: N/A;  . AV FISTULA PLACEMENT Left 06/13/2018   Procedure:  ARTERIOVENOUS (AV) FISTULA CREATION;  Surgeon: Rosetta Posner, MD;  Location: [place];  Service: Vascular;  Laterality: Left;  . AV FISTULA PLACEMENT Left 08/18/2018   Procedure: INSERTION OF 4-7MM X 45CM ARTERIOVENOUS (AV) GORE-TEX GRAFT LEFT  FOREARM;  Surgeon: Rosetta Posner, MD;  Location: [place];  Service: Vascular;  Laterality: Left;  . AV FISTULA PLACEMENT Right 04/06/2019   Procedure: INSERTION OF ARTERIOVENOUS (AV) GORE-TEX GRAFT RIGHT UPPER ARM;  Surgeon: Waynetta Sandy, MD;  Location: [place];  Service: Vascular;  Laterality: Right;  . AV FISTULA PLACEMENT Right 04/13/2019   Procedure: INSERTION OF ARTERIOVENOUS (AV) BOVINE  ARTEGRAFT GRAFT RIGHT UPPER EXTREMITY;  Surgeon: Serafina Mitchell, MD;  Location: [place];  Service: Vascular;  Laterality: Right;  . [place] REMOVAL Right 05/16/2019   Procedure: REMOVAL OF ARTERIOVENOUS GORETEX GRAFT ([place]) RIGHT ARM;  Surgeon: Angelia Mould, MD;  Location: [place];  Service: Vascular;  Laterality: Right;  . BASCILIC VEIN TRANSPOSITION Right 03/07/2019   Procedure: First Stage Bascilic Vein Transposition Right Arm;  Surgeon: Serafina Mitchell, MD;  Location: [place];  Service: Vascular;  Laterality: Right;  . CATARACT EXTRACTION Right 2005  . INSERTION OF DIALYSIS CATHETER N/A 08/18/2018   Procedure: INSERTION OF DIALYSIS CATHETER;  Surgeon: Rosetta Posner, MD;  Location: [place];  Service: Vascular;  Laterality: N/A;  . INSERTION OF DIALYSIS CATHETER Right 07/25/2019   Procedure: INSERTION OF DIALYSIS CATHETER RIGHT INTERNAL JUGULAR (TUNNELED);  Surgeon: Constance Haw,  Lanell Matar, MD;  Location: [facility];  Service: General;  Laterality: Right;  . IR FLUORO GUIDE CV LINE RIGHT  12/06/2018  . IR PTA ADDL CENTRAL DIALYSIS SEG THRU DIALY CIRCUIT RIGHT Right 12/06/2018  . THROMBECTOMY AND REVISION OF ARTERIOVENTOUS (AV) GORETEX  GRAFT Left 09/29/2018   Procedure: INSERTION OF ARTERIOVENTOUS (AV) GORETEX  GRAFT ARM;  Surgeon: Waynetta Sandy, MD;  Location: [place];  Service: Vascular;  Laterality: Left;  . UPPER EXTREMITY ANGIOGRAPHY Right 03/28/2019   Procedure: UPPER EXTREMITY ANGIOGRAPHY;  Surgeon: Waynetta Sandy, MD;  Location: [place] [facility];  Service: Cardiovascular;  Laterality: Right;  . VENOGRAM  10/27/2018   Procedure: Venogram;  Surgeon: Marty Heck, MD;  Location: [place] [facility];  Service: Cardiovascular;;  bilateral arm     A IV Location/Drains/Wounds Patient Lines/Drains/Airways Status   Active Line/Drains/Airways    Name:   Placement date:   Placement time:   Site:   Days:   Peripheral IV 10/20/19 Right;Anterior Forearm   10/20/19    0608    Forearm   less than 1   Fistula / Graft Left Upper arm Arteriovenous fistula   06/13/18    1044    Upper arm   494   Fistula / Graft Left Forearm Arteriovenous vein graft   08/18/18    1046    Forearm   428   Fistula / Graft Left Upper arm Arteriovenous vein graft   09/29/18    1108    Upper arm   386   Fistula / Graft Right Upper arm Arteriovenous fistula   03/07/19    1045    Upper arm   227   Fistula / Graft Right Upper arm Arteriovenous vein graft   04/06/19    0926    Upper arm   197   Fistula / Graft Right Upper arm Arteriovenous vein graft   04/13/19    1634    Upper arm   190   Hemodialysis Catheter Right Internal jugular Double lumen Permanent (Tunneled)   07/25/19    1333    Internal jugular   87          Intake/Output Last 24 hours No intake or output data in the 24 hours ending 10/20/19 1437  Labs/Imaging Results for orders placed or performed during the hospital encounter of 10/20/19 (from the past 48 hour(s))  Comprehensive metabolic panel     Status: Abnormal   Collection Time: 10/20/19  6:59 AM  Result Value Ref Range   Sodium 139 135 - 145 mmol/L   Potassium 4.4 3.5 - 5.1 mmol/L   Chloride 97 (L) 98 - 111 mmol/L   CO2 23 22 - 32 mmol/L   Glucose, Bld 139 (H) 70 - 99 mg/dL   BUN 56 (H) 8 - 23 mg/dL   Creatinine, Ser 7.76 (H) 0.44 - 1.00 mg/dL    Calcium 8.7 (L) 8.9 - 10.3 mg/dL   Total Protein 7.9 6.5 - 8.1 g/dL   Albumin 2.8 (L) 3.5 - 5.0 g/dL   AST 12 (L) 15 - 41 U/L   ALT 16 0 - 44 U/L   Alkaline Phosphatase 75 38 - 126 U/L   Total Bilirubin 0.7 0.3 - 1.2 mg/dL   GFR calc non Af Amer 5 (L) >60 mL/min   GFR calc Af Amer 6 (L) >60 mL/min   Anion gap 19 (H) 5 - 15    Comment: Performed at [facility],  [address], [place], [place] [address]  CBC with Differential     Status: Abnormal   Collection Time: 10/20/19  6:59 AM  Result Value Ref Range   WBC 15.7 (H) 4.0 - 10.5 K/uL   RBC 3.58 (L) 3.87 - 5.11 MIL/uL   Hemoglobin 9.7 (L) 12.0 - 15.0 g/dL   HCT 32.8 (L) 36.0 - 46.0 %   MCV 91.6 80.0 - 100.0 fL   MCH 27.1 26.0 - 34.0 pg   MCHC 29.6 (L) 30.0 - 36.0 g/dL   RDW 17.8 (H) 11.5 - 15.5 %   Platelets 348 150 - 400 K/uL   nRBC 0.0 0.0 - 0.2 %   Neutrophils Relative % 80 %   Neutro Abs 12.4 (H) 1.7 - 7.7 K/uL   Lymphocytes Relative 13 %   Lymphs Abs 2.1 0.7 - 4.0 K/uL   Monocytes Relative 6 %   Monocytes Absolute 1.0 0.1 - 1.0 K/uL   Eosinophils Relative 0 %   Eosinophils Absolute 0.0 0.0 - 0.5 K/uL   Basophils Relative 0 %   Basophils Absolute 0.0 0.0 - 0.1 K/uL   Immature Granulocytes 1 %   Abs Immature Granulocytes 0.20 (H) 0.00 - 0.07 K/uL    Comment: Performed at [facility], [address], [place], [place] [address]  Protime-INR     Status: None   Collection Time: 10/20/19  6:59 AM  Result Value Ref Range   Prothrombin Time 13.2 11.4 - 15.2 seconds   INR 1.0 0.8 - 1.2    Comment: (NOTE) INR goal varies based on device and disease states. Performed at [facility], [address] [place], [place] [address]   Urinalysis, Routine w reflex microscopic     Status: Abnormal   Collection Time: 10/20/19  9:54 AM  Result Value Ref Range   Color, Urine YELLOW YELLOW   APPearance HAZY (A) CLEAR   Specific Gravity, Urine 1.010 1.005 - 1.030   pH 7.0 5.0 - 8.0   Glucose, UA 50 (A) NEGATIVE mg/dL   Hgb urine  dipstick NEGATIVE NEGATIVE   Bilirubin Urine NEGATIVE NEGATIVE   Ketones, ur NEGATIVE NEGATIVE mg/dL   Protein, ur 100 (A) NEGATIVE mg/dL   Nitrite NEGATIVE NEGATIVE   Leukocytes,Ua NEGATIVE NEGATIVE   RBC / HPF 0-5 0 - 5 RBC/hpf   WBC, UA 6-10 0 - 5 WBC/hpf   Bacteria, UA RARE (A) NONE SEEN   Squamous Epithelial / LPF 11-20 0 - 5    Comment: Performed at [facility], [address], [place], [place] [address]  CBG monitoring, ED     Status: Abnormal   Collection Time: 10/20/19 11:53 AM  Result Value Ref Range   Glucose-Capillary 122 (H) 70 - 99 mg/dL   Dg Chest 1 View  Result Date: 10/20/2019 CLINICAL DATA:  Left-sided weakness EXAM: CHEST  1 VIEW COMPARISON:  September 09, 2019 FINDINGS: Right tunneled central line is unchanged. Low lung volumes. No new consolidation or edema. Possible trace left pleural effusion. Stable cardiomediastinal contours. IMPRESSION: Possible trace left pleural effusion. Electronically Signed   By: Macy Mis M.D.   On: 10/20/2019 11:45   Ct Head Wo Contrast  Result Date: 10/20/2019 CLINICAL DATA:  Focal neuro deficit, stroke suspected. Dizziness and weakness. EXAM: CT HEAD WITHOUT CONTRAST TECHNIQUE: Contiguous axial images were obtained from the base of the skull through the vertex without intravenous contrast. COMPARISON:  09/10/2019 FINDINGS: Brain: There are acute infarcts involving the head and body of the right caudate  nucleus and adjacent white matter. No intracranial hemorrhage, mass, midline shift, or extra-axial fluid collection is identified. The ventricles and sulci are within normal limits for age. Hypodensities in the cerebral white matter bilaterally are unchanged and nonspecific but compatible with mild chronic small vessel ischemic disease. Vascular: Calcified atherosclerosis at the skull base. No hyperdense vessel. Skull: No fracture or focal osseous lesion. Sinuses/Orbits: Visualized paranasal sinuses and mastoid air cells are clear. Right  cataract extraction is noted. Other: None. IMPRESSION: 1. Acute right basal ganglia infarcts. 2. Mild chronic small vessel ischemic disease. Electronically Signed   By: Logan Bores M.D.   On: 10/20/2019 06:53   Mr Angio Head Wo Contrast  Result Date: 10/20/2019 CLINICAL DATA:  Left-sided weakness. EXAM: MRI HEAD WITHOUT CONTRAST MRA HEAD WITHOUT CONTRAST TECHNIQUE: Multiplanar, multiecho pulse sequences of the brain and surrounding structures were obtained without intravenous contrast. Angiographic images of the head were obtained using MRA technique without contrast. COMPARISON:  Head CT 10/20/2019 and MRI 07/19/2019 FINDINGS: MRI HEAD FINDINGS Brain: As seen on today's head CT, they are multiple acute infarcts involving the right basal ganglia and adjacent white matter. A few subcentimeter foci of mild trace diffusion weighted signal hyperintensity in the left parietal and posterior left frontal white matter are without reduced ADC and likely reflect subacute to chronic ischemia. A 4 mm cortical infarct in the left postcentral gyrus is new from the prior MRI but not acute. T2 hyperintensities elsewhere in the cerebral white matter bilaterally are unchanged and nonspecific but compatible with mild chronic small vessel ischemic disease. Small chronic infarcts in the left cerebellum and left thalamus are unchanged. Mild cerebral atrophy is within normal limits for age. No intracranial hemorrhage, mass, midline shift, or extra-axial fluid collection is identified. Vascular: Major intracranial vascular flow voids are preserved. Skull and upper cervical spine: No suspicious marrow lesion. Sinuses/Orbits: Right cataract extraction. No evidence of significant inflammatory disease in the paranasal sinuses. Left petrous apex effusion. Other: None. MRA HEAD FINDINGS The visualized distal vertebral arteries are patent to the basilar and codominant. Patent right PICA, bilateral AICA, and bilateral SCA origins are  visualized. The basilar artery is widely patent. There is a large left posterior communicating artery with hypoplastic or absent left P1 segment, a normal variant. Both PCAs are patent without evidence of significant proximal stenosis. The internal carotid arteries are patent from skull base to carotid termini without definite stenosis. Focally decreased signal in the right petrous ICA is likely artifactual related to pneumatization of the right petrous apex. ACAs and MCAs are patent without evidence of proximal branch occlusion or significant proximal stenosis. No aneurysm is identified. IMPRESSION: 1. Acute right basal ganglia infarcts. 2. Chronic ischemia with multiple old infarcts as above. 3. Negative head MRA. Electronically Signed   By: Logan Bores M.D.   On: 10/20/2019 11:46   Mr Brain Wo Contrast  Result Date: 10/20/2019 CLINICAL DATA:  Left-sided weakness. EXAM: MRI HEAD WITHOUT CONTRAST MRA HEAD WITHOUT CONTRAST TECHNIQUE: Multiplanar, multiecho pulse sequences of the brain and surrounding structures were obtained without intravenous contrast. Angiographic images of the head were obtained using MRA technique without contrast. COMPARISON:  Head CT 10/20/2019 and MRI 07/19/2019 FINDINGS: MRI HEAD FINDINGS Brain: As seen on today's head CT, they are multiple acute infarcts involving the right basal ganglia and adjacent white matter. A few subcentimeter foci of mild trace diffusion weighted signal hyperintensity in the left parietal and posterior left frontal white matter are without reduced ADC and likely reflect  subacute to chronic ischemia. A 4 mm cortical infarct in the left postcentral gyrus is new from the prior MRI but not acute. T2 hyperintensities elsewhere in the cerebral white matter bilaterally are unchanged and nonspecific but compatible with mild chronic small vessel ischemic disease. Small chronic infarcts in the left cerebellum and left thalamus are unchanged. Mild cerebral atrophy is  within normal limits for age. No intracranial hemorrhage, mass, midline shift, or extra-axial fluid collection is identified. Vascular: Major intracranial vascular flow voids are preserved. Skull and upper cervical spine: No suspicious marrow lesion. Sinuses/Orbits: Right cataract extraction. No evidence of significant inflammatory disease in the paranasal sinuses. Left petrous apex effusion. Other: None. MRA HEAD FINDINGS The visualized distal vertebral arteries are patent to the basilar and codominant. Patent right PICA, bilateral AICA, and bilateral SCA origins are visualized. The basilar artery is widely patent. There is a large left posterior communicating artery with hypoplastic or absent left P1 segment, a normal variant. Both PCAs are patent without evidence of significant proximal stenosis. The internal carotid arteries are patent from skull base to carotid termini without definite stenosis. Focally decreased signal in the right petrous ICA is likely artifactual related to pneumatization of the right petrous apex. ACAs and MCAs are patent without evidence of proximal branch occlusion or significant proximal stenosis. No aneurysm is identified. IMPRESSION: 1. Acute right basal ganglia infarcts. 2. Chronic ischemia with multiple old infarcts as above. 3. Negative head MRA. Electronically Signed   By: Logan Bores M.D.   On: 10/20/2019 11:46    Pending Labs Unresulted Labs (From admission, onward)    Start     Ordered   10/21/19 0500  Hemoglobin A1c  Tomorrow morning,   R     10/20/19 1032   10/21/19 0500  Lipid panel  Tomorrow morning,   R    Comments: Fasting    10/20/19 1032   10/20/19 0657  SARS CORONAVIRUS 2 (TAT 6-24 HRS) Nasopharyngeal Nasopharyngeal Swab  (Asymptomatic/Tier 3)  Once,   STAT    Question Answer Comment  Is this test for diagnosis or screening Screening   Symptomatic for COVID-19 as defined by CDC No   Hospitalized for COVID-19 No   Admitted to ICU for COVID-19 No    Previously tested for COVID-19 No   Resident in a congregate (group) care setting No   Employed in healthcare setting No   Pregnant No      10/20/19 0656          Vitals/Pain Today's Vitals   10/20/19 1330 10/20/19 1400 10/20/19 1429 10/20/19 1430  BP:  (!) 165/93  (!) 178/95  Pulse: 88 93  82  Resp: 19 14  14   Temp:    98.2 F (36.8 C)  TempSrc:    Oral  SpO2: 94% 99%  95%  Weight:      Height:      PainSc:   0-No pain     Isolation Precautions No active isolations  Medications Medications  0.9 %  sodium chloride infusion ( Intravenous Hold 10/20/19 1033)  isosorbide mononitrate (IMDUR) 24 hr tablet 30 mg (30 mg Oral Given 10/20/19 1358)  calcium carbonate (TUMS - dosed in mg elemental calcium) chewable tablet 200 mg of elemental calcium (has no administration in time range)  clopidogrel (PLAVIX) tablet 75 mg (has no administration in time range)  gabapentin (NEURONTIN) capsule 100 mg (has no administration in time range)  multivitamin (RENA-VIT) tablet 1 tablet (has no administration in time range)  fluticasone (FLONASE) 50 MCG/ACT nasal spray 1 spray (has no administration in time range)   stroke: mapping our early stages of recovery book (has no administration in time range)  0.9 %  sodium chloride infusion ( Intravenous Hold 10/20/19 1317)  acetaminophen (TYLENOL) tablet 650 mg (650 mg Oral Given 10/20/19 1328)    Or  acetaminophen (TYLENOL) 160 MG/5ML solution 650 mg ( Per Tube See Alternative 10/20/19 1328)    Or  acetaminophen (TYLENOL) suppository 650 mg ( Rectal See Alternative 10/20/19 1328)  senna-docusate (Senokot-S) tablet 1 tablet (has no administration in time range)  heparin injection 5,000 Units (5,000 Units Subcutaneous Given 10/20/19 1358)  hydrALAZINE (APRESOLINE) injection 10 mg (has no administration in time range)  insulin aspart (novoLOG) injection 0-9 Units (0 Units Subcutaneous Not Given 10/20/19 1333)  insulin aspart (novoLOG) injection  0-5 Units (has no administration in time range)  aspirin EC tablet 81 mg (has no administration in time range)  acetaminophen (TYLENOL) tablet 1,000 mg (1,000 mg Oral Not Given 10/20/19 1328)  aspirin chewable tablet 324 mg (324 mg Oral Given 10/20/19 1151)    Mobility walks with device Low fall risk   Focused Assessments    R Recommendations: See Admitting Provider Note  Report given to:   Additional Notes:

## 2019-10-21 ENCOUNTER — Inpatient Hospital Stay (HOSPITAL_COMMUNITY): Payer: Medicare PPO

## 2019-10-21 DIAGNOSIS — I639 Cerebral infarction, unspecified: Secondary | ICD-10-CM | POA: Diagnosis not present

## 2019-10-21 DIAGNOSIS — R531 Weakness: Secondary | ICD-10-CM

## 2019-10-21 LAB — GLUCOSE, CAPILLARY
Glucose-Capillary: 142 mg/dL — ABNORMAL HIGH (ref 70–99)
Glucose-Capillary: 117 mg/dL — ABNORMAL HIGH (ref 70–99)
Glucose-Capillary: 154 mg/dL — ABNORMAL HIGH (ref 70–99)
Glucose-Capillary: 142 mg/dL — ABNORMAL HIGH (ref 70–99)

## 2019-10-21 LAB — RENAL FUNCTION PANEL
Albumin: 2.1 g/dL — ABNORMAL LOW (ref 3.5–5.0)
Anion gap: 16 — ABNORMAL HIGH (ref 5–15)
BUN: 62 mg/dL — ABNORMAL HIGH (ref 8–23)
CO2: 24 mmol/L (ref 22–32)
Calcium: 8.4 mg/dL — ABNORMAL LOW (ref 8.9–10.3)
Chloride: 100 mmol/L (ref 98–111)
Creatinine, Ser: 8.86 mg/dL — ABNORMAL HIGH (ref 0.44–1.00)
GFR calc Af Amer: 5 mL/min — ABNORMAL LOW (ref >60)
GFR calc non Af Amer: 4 mL/min — ABNORMAL LOW (ref >60)
Glucose, Bld: 131 mg/dL — ABNORMAL HIGH (ref 70–99)
Phosphorus: 4.4 mg/dL (ref 2.5–4.6)
Potassium: 4.9 mmol/L (ref 3.5–5.1)
Sodium: 140 mmol/L (ref 135–145)

## 2019-10-21 LAB — ECHOCARDIOGRAM LIMITED
Height: 67 in
Weight: 2240 oz

## 2019-10-21 LAB — CBC
HCT: 27.4 % — ABNORMAL LOW (ref 36.0–46.0)
Hemoglobin: 8.6 g/dL — ABNORMAL LOW (ref 12.0–15.0)
MCH: 27.7 pg (ref 26.0–34.0)
MCHC: 31.4 g/dL (ref 30.0–36.0)
MCV: 88.4 fL (ref 80.0–100.0)
Platelets: 274 10*3/uL (ref 150–400)
RBC: 3.1 MIL/uL — ABNORMAL LOW (ref 3.87–5.11)
RDW: 17.7 % — ABNORMAL HIGH (ref 11.5–15.5)
WBC: 10.9 10*3/uL — ABNORMAL HIGH (ref 4.0–10.5)
nRBC: 0 % (ref 0.0–0.2)

## 2019-10-21 LAB — LIPID PANEL
Cholesterol: 150 mg/dL (ref 0–200)
HDL: 47 mg/dL (ref >40)
LDL Cholesterol: 65 mg/dL (ref 0–99)
Total CHOL/HDL Ratio: 3.2 RATIO
Triglycerides: 192 mg/dL — ABNORMAL HIGH (ref <150)
VLDL: 38 mg/dL (ref 0–40)

## 2019-10-21 LAB — HEMOGLOBIN A1C
Hgb A1c MFr Bld: 7.6 % — ABNORMAL HIGH (ref 4.8–5.6)
Mean Plasma Glucose: 171.42 mg/dL

## 2019-10-21 NOTE — Progress Notes (Signed)
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 Unsure of current anticoagulant level or if it is in therapeutic range/window (unable to find in labs section or pharmacy notes).  Holding for now, continue to await formal results of dopplers and anticoagulation status. Will attempt therapy when patient is medically appropriate.    Windell Norfolk, DPT, PN1   Supplemental Physical Therapist [facility]    Pager [phone] Acute Rehab Office [phone]

## 2019-10-21 NOTE — Progress Notes (Signed)
STROKE TEAM PROGRESS NOTE   INTERVAL HISTORY Her son and RN are at the bedside.  Pt lying in bed, about to have lunch. She is awake alert but lethargic, still has left hemiparesis. UE venous doppler showed acute right IJ and age-indetermined brachial vein DVT.    OBJECTIVE Vitals:   10/20/19 1600 10/20/19 1615 10/20/19 1645 10/20/19 1700  BP:  (!) 159/90 (!) 169/91 (!) 177/104  Pulse:  81 86 76  Resp: 14 15 20 13   Temp:  98.2 F (36.8 C)    TempSrc:  Oral    SpO2:  100% 100% 100%  Weight:      Height:        CBC:  Recent Labs  Lab 10/20/19 0659 10/21/19 0405  WBC 15.7* 10.9*  NEUTROABS 12.4*  --   HGB 9.7* 8.6*  HCT 32.8* 27.4*  MCV 91.6 88.4  PLT 348 [id number]    Basic Metabolic Panel:  Recent Labs  Lab 10/20/19 0659 10/21/19 0405  NA 139 140  K 4.4 4.9  CL 97* 100  CO2 23 24  GLUCOSE 139* 131*  BUN 56* 62*  CREATININE 7.76* 8.86*  CALCIUM 8.7* 8.4*  PHOS  --  4.4    Lipid Panel:     Component Value Date/Time   CHOL 150 10/21/2019 0405   TRIG 192 (H) 10/21/2019 0405   HDL 47 10/21/2019 0405   CHOLHDL 3.2 10/21/2019 0405   VLDL 38 10/21/2019 0405   LDLCALC 65 10/21/2019 0405   HgbA1c:  Lab Results  Component Value Date   HGBA1C 7.6 (H) 10/21/2019   Urine Drug Screen:     Component Value Date/Time   LABOPIA NONE DETECTED 07/19/2019 1220   COCAINSCRNUR NONE DETECTED 07/19/2019 1220   LABBENZ NONE DETECTED 07/19/2019 1220   AMPHETMU NONE DETECTED 07/19/2019 1220   THCU NONE DETECTED 07/19/2019 1220   LABBARB NONE DETECTED 07/19/2019 1220    Alcohol Level     Component Value Date/Time   ETH <10 07/19/2019 1405    IMAGING  Dg Chest 1 View 10/20/2019 IMPRESSION:  Possible trace left pleural effusion.   Ct Head Wo Contrast 10/20/2019 IMPRESSION:  1. Acute right basal ganglia infarcts.  2. Mild chronic small vessel ischemic disease.   Mr Brain 19 Contrast Mr Angio Head Wo Contrast 10/20/2019 IMPRESSION:  1. Acute right basal ganglia  infarcts.  2. Chronic ischemia with multiple old infarcts as above.  3. Negative head MRA.   Transthoracic Echocardiogram  00/00/2020 Pending  Bilateral Carotid Dopplers  Right Carotid: Velocities in the right ICA are consistent with a 1-39% stenosis. Left Carotid: Velocities in the left ICA are consistent with a 1-39% stenosis. Vertebrals:  Bilateral vertebral arteries demonstrate antegrade flow. Subclavians: Normal flow hemodynamics were seen in bilateral subclavian              arteries.  Bilateral Lower Extremity Venous Dopplers Right: There is no evidence of deep vein thrombosis in the lower extremity. Left: There is no evidence of deep vein thrombosis in the lower extremity. However, portions of this examination were limited- see technologist comments above.  Bilateral upper Extremity Venous Dopplers Right: No evidence of superficial vein thrombosis in the upper extremity. Findings consistent with acute deep vein thrombosis involving the right internal jugular vein. Findings consistent with age indeterminate deep vein thrombosis involving the one of the paired veins of the right brachial veins. Left: No evidence of thrombosis in the subclavian.  ECG - SR rate 83 BPM. (See cardiology  reading for complete details)    PHYSICAL EXAM  Temp:  [98.2 F (36.8 C)-100 F (37.8 C)] 100 F (37.8 C) (11/28 1216) Pulse Rate:  [76-94] 94 (11/28 1216) Resp:  [11-21] 21 (11/28 0746) BP: (149-178)/(79-104) 164/93 (11/28 1216) SpO2:  [93 %-100 %] 100 % (11/28 1216)  General - Well nourished, well developed, in no apparent distress, mild lethargic.  Ophthalmologic - fundi not visualized due to noncooperation.  Cardiovascular - Regular rhythm and rate.  Neuro - mildly lethargic, but awake alert, eyes open, mild abulia. Orientated to time place and people. No aphasia, following all simple commands, able to name and repeat. PERRL, visual field full, EOMI, no gaze preference. Mild  left facial asymmetry, tongue midline. LUE 3/5 with pronator drift, finger grip 3-/5. LLE proximal 3-/5, knee flexion 4/5, foot DF 3/5, PF 4/5. Sensation symmetrical bilaterally, FTN intact on the right but ataxia on the left not out of proportion to the weakness. DTR 1+ on the left and no babinski. Gait not tested.    ASSESSMENT/PLAN [name] is a 68 y.o. female with history of ESRD ( on dialysis M-W-F), TIA 2010, HTN, HLD, Ha, DM2 who presented to  [facility] with c/o dizziness, left arm weakness and left leg numbness. She did not receive IV t-PA due to late presentation (>4.5 hours from time of onset)  Stroke:  acute right BG, CR, caudate scattered infarcts, concerning for embolic stroke in the setting of right IJ acute DVT  CT head - Acute right basal ganglia infarcts.   MRI head -  Acute right basal ganglia infarcts. Chronic ischemia with multiple old infarcts as above.   MRA head - Negative   Carotid Doppler unremarkable  2D Echo - pending  Bilateral LE Venous Dopplers no DVT  Bilateral UE venous doppler - right IJ acute DVT, right brachial vein age-indetermined DVT  TCD bubble study pending to evaluate PFO  If PFO neg, will need to consider 30 day cardiac event monitoring  LDL - 65  HgbA1c - 7.6  VTE prophylaxis - [place] Heparin  aspirin 81 mg daily and clopidogrel 75 mg daily prior to admission, now on aspirin 81 mg daily and clopidogrel 75 mg daily. OK to start heparin IV from neuro standpoint. Once IV heparin started, DAPT can be discontinued  Patient counseled to be compliant with her antithrombotic medications  Ongoing aggressive stroke risk factor management  Therapy recommendations:  pending  Disposition:  Pending  Right IJ acute DVT  Found on UE venous Doppler  Could be related to dialysis catheter  Okay for heparin IV from neuro standpoint  Treatment as per primary team  ESRD on HD  04/2019 right arm AV graft removed  Nephrology  on board  Hx of bacteremia  06/2019 -admitted for altered mental status, blood culture showed positive gram negative rods, confirmed for Pseudomonas infection, concerning for coming from left 3-4 discitis/osteomyelitis.  Treated with cefepime initially and then transition to [facility] for 6 weeks.  08/2019 blood culture repeat negative.  Hypertension  Home BP meds: Coreg ; Norvasc ; Imdur  Current BP meds: Imdur  BP stable but on the higher end . Permissive hypertension (OK if <180/105) but gradually normalize in 5-7 days  . Long-term BP goal normotensive  Diabetes  Home diabetic meds: insulin  Current diabetic meds: insulin  HgbA1c 7.6, goal < 7.0  SSI  CBG monitoring  Close PCP follow-up  Other Stroke Risk Factors  Advanced age  Hx TIA in  2010  Other Active Problems  Mild Leukocytosis - 15.7->10.9 (afebrile)  Anemia due to ESRD - Hb - 9.7->8.6   Hospital day # 1  Rosalin Hawking, MD PhD Stroke Neurology 10/21/2019 3:10 PM  I spent  35 minutes in total face-to-face time with the patient, more than 50% of which was spent in counseling and coordination of care, reviewing test results, images and medication, and discussing the diagnosis of stroke, acute DVT, ESRD on dialysis, uncontrolled diabetes, treatment plan and potential prognosis. This patient's care requiresreview of multiple databases, neurological assessment, discussion with family, other specialists and medical decision making of high complexity. I had long discussion with son at bedside, updated pt current condition, treatment plan and potential prognosis, and answered all the questions. He expressed understanding and appreciation.  I also discussed with Dr. Broadus John over the phone.   To contact Stroke Continuity provider, please refer to [email or web address] After hours, contact General Neurology

## 2019-10-21 NOTE — Progress Notes (Signed)
OT Cancellation Note  Patient Details Name: Jaterria Nylander MRN: [id number] DOB: 1951-07-12   Cancelled Treatment:    Reason Eval/Treat Not Completed: Medical issues which prohibited therapy.  Pt with IJ DVT, and has not yet started IV Heparin.  Will defer OT today, and check back.  Lucille Passy, OTR/L Acute Rehabilitation Services Pager [phone] Office [phone]   Lucille Passy M 10/21/2019, 3:17 PM

## 2019-10-21 NOTE — Progress Notes (Signed)
[facility] Progress Note  Name: [name] MRN: [id number] DOB: 1951/11/08  Chief Complaint:  Acute left sided weakness  Subjective:  Bedside BP monitor with [id number] systolic on my exam.  States she thinks her left leg strength is coming back but not yet to baseline.  Doesn't feel like she's building up fluid.  Review of systems:  Denies shortness of breath  No cp No n/v -----------------  Background on consult:  [name] is a 68 y.o. female with a history of end-stage renal disease, hypertension, diabetes mellitus type 2, GERD, and prior CVA who presented from [facility] with acute ischemic stroke.  She had presented there with left-sided weakness and difficulty ambulating which she noted at 4 AM on 1127 when she awoke to get ready for dialysis she was transferred for neurology eval.  Nephrology is consulted for assistance with management of hemodialysis she is normally patient at [facility] with prescription as below.  Note that she had been considering a transition to peritoneal dialysis per the dialysis unit; pt confirms.  They also indicate that her blood pressure drops easily with ultrafiltration.  They indicate that she has had multiple failed accesses and is dialyzing currently with a tunneled catheter; pt confirms no access in arms.  She left at her dry weight her last treatment on 11/25.  MRI without contrast with acute right basal ganglia infarcts and chronic ischemia with multiple old infarcts.  She was outside the window for tPA.  She states leg weakness getting better.  Wasn't able to walk this AM.  Has had back pain last several months.  Denies shortness of breath or n/v/d.  Spoke with her son as well who is at bedside    Intake/Output Summary (Last 24 hours) at 10/21/2019 0953 Last data filed at 10/21/2019 [id number] Gross per 24 hour  Intake 120 ml  Output -  Net 120 ml    Vitals:  Vitals:   10/20/19 1645 10/20/19 1700 10/21/19 0746 10/21/19  0810  BP: (!) 169/91 (!) 177/104 (!) 149/79   Pulse: 86 76 94   Resp: 20 13 (!) 21   Temp:    100 F (37.8 C)  TempSrc:    Oral  SpO2: 100% 100% 99%   Weight:      Height:         Physical Exam:  General: Adult female in bed in no acute distress at rest HEENT: Normocephalic atraumatic Eyes: Extraocular movements intact sclera anicteric Neck: Supple trachea midline Heart:S1S2 no rub Lungs: Clear to auscultation bilaterally normal work of breathing at rest Abdomen: Soft nontender nondistended normal bowel sounds Extremities: No edema appreciated Skin: No rash on extremities exposed Neuro: alert and conversant; left leg subjectively weaker than right  psych calm mood and affect Access right IJ tunneled catheter  Medications reviewed   Labs:  BMP Latest Ref Rng & Units 10/21/2019 10/20/2019 09/12/2019  Glucose 70 - 99 mg/dL 131(H) 139(H) 89  BUN 8 - 23 mg/dL 62(H) 56(H) 20  Creatinine 0.44 - 1.00 mg/dL 8.86(H) 7.76(H) 5.44(H)  Sodium 135 - 145 mmol/L 140 139 134(L)  Potassium 3.5 - 5.1 mmol/L 4.9 4.4 4.0  Chloride 98 - 111 mmol/L 100 97(L) 90(L)  CO2 22 - 32 mmol/L 24 23 27   Calcium 8.9 - 10.3 mg/dL 8.4(L) 8.7(L) 8.9   Dialysis Orders:  [facility]  Schedule: MWF Time 3.5 hours  Tunneled Catheter, multiple failed access RUE and she is considering going to PD  Profile 1 -  pressure drops very easily;max is pull 1.1 an hour; do not pull rinseback revaclear 300 dialyzer BF 400  DF 600  Bath 2k/2.5 ca EDW 62.5 kg and last weight of 62.5 kg on 11/25 Anemia epogen 1200 units each tx; iron 50 mg once weekly (had this week already) Bone mineral 1.5 mcg hectoral each tx   Assessment/Plan:   # ESRD - Normally MWF schedule; last tx on 11/25.  Acute ischemic stroke 11/27 am  - HD deferred on 11/27 in the setting of acute stroke - Will assess dialysis needs daily.  Plan to hold HD today to avoid dropping her blood pressure - Plan at latest for tx on 11/30, Monday   #  Acute stroke - Defer usual treatment for HD today in the setting of acute stroke - neurology has consulted  # HTN  - BP management per primary in setting of acute stroke - Avoid hypotension with HD   # Anemia of CKD  - Defer ESA with acute CVA  # Secondary hyperparathyroidism  - continue hectorol   Claudia Desanctis, MD 10/21/2019 9:53 AM

## 2019-10-21 NOTE — Progress Notes (Signed)
PROGRESS NOTE    Adylyn Heiting  [id number] DOB: 10/12/1951 DOA: 10/20/2019 PCP: Manon Hilding, MD  Brief Narrative: Jayn Christophe is a 68 y.o. female with medical history of ESRD (MWF), diabetes mellitus type 2, stroke, hypertension, hyperlipidemia presenting with left-sided weakness.  The patient was her usual self around 9 PM when she went to bed on the evening of 10/19/2019.  However when she woke up to get ready for dialysis at 4 AM on 10/20/2019, the patient noted left-sided weakness and had difficulty ambulating  Assessment & Plan:   Acute ischemic stroke -MRI notes right basal ganglia infarcts -Complete stroke work-up today -Follow-up carotid duplex and echo -Hemoglobin A1c is 7.6, LDL is 65 -She is on aspirin Plavix at baseline this was continued -Neurology consulting -PT OT, SLP evaluation -Anticipate she will need short-term rehab  ESRD -Nephrology consulted, usually dialyzes Monday Wednesday Friday, did not get dialyzed yesterday appears euvolemic -She has a right IJ HD catheter  Diabetes mellitus type 2 -Hemoglobin A1c is 7.6 -NovoLog sliding scale  Essential hypertension -Holding amlodipine and carvedilol to allow for permissive hypertension -hydralazine prn SBP >220  Anemia of chronic disease -Per renal  DVT prophylaxis: Heparin cutaneous Code Status: Full code Family Communication: No family at bedside Disposition Plan: Will likely need short-term rehab  Consultants:   Neurology, renal   Procedures:   Antimicrobials:    Subjective: -Feels okay, continues to have left-sided weakness  Objective: Vitals:   10/20/19 1645 10/20/19 1700 10/21/19 0746 10/21/19 0810  BP: (!) 169/91 (!) 177/104 (!) 149/79   Pulse: 86 76 94   Resp: 20 13 (!) 21   Temp:    100 F (37.8 C)  TempSrc:    Oral  SpO2: 100% 100% 99%   Weight:      Height:        Intake/Output Summary (Last 24 hours) at 10/21/2019 1152 Last data filed at 10/21/2019  [id number] Gross per 24 hour  Intake 120 ml  Output --  Net 120 ml   Filed Weights   10/20/19 0529  Weight: 63.5 kg    Examination:  General exam: Appears calm and comfortable, AAO x3, no distress Respiratory system: Clear to auscultation, left chest HD catheter noted Cardiovascular system: S1 & S2 heard, RRR. Gastrointestinal system: Abdomen is nondistended, soft and nontender.Normal bowel sounds heard. Central nervous system: Alert and oriented, left upper and lower extremity is 3 out of 5 Extremities: No edema Skin: No rashes, lesions or ulcers Psychiatry: Judgement and insight appear normal. Mood & affect appropriate.     Data Reviewed:   CBC: Recent Labs  Lab 10/20/19 0659 10/21/19 0405  WBC 15.7* 10.9*  NEUTROABS 12.4*  --   HGB 9.7* 8.6*  HCT 32.8* 27.4*  MCV 91.6 88.4  PLT 348 [id number]   Basic Metabolic Panel: Recent Labs  Lab 10/20/19 0659 10/21/19 0405  NA 139 140  K 4.4 4.9  CL 97* 100  CO2 23 24  GLUCOSE 139* 131*  BUN 56* 62*  CREATININE 7.76* 8.86*  CALCIUM 8.7* 8.4*  PHOS  --  4.4   GFR: Estimated Creatinine Clearance: 5.8 mL/min (A) (by C-G formula based on SCr of 8.86 mg/dL (H)). Liver Function Tests: Recent Labs  Lab 10/20/19 0659 10/21/19 0405  AST 12*  --   ALT 16  --   ALKPHOS 75  --   BILITOT 0.7  --   PROT 7.9  --   ALBUMIN 2.8* 2.1*   No results for  input(s): LIPASE, AMYLASE in the last 168 hours. No results for input(s): AMMONIA in the last 168 hours. Coagulation Profile: Recent Labs  Lab 10/20/19 0659  INR 1.0   Cardiac Enzymes: No results for input(s): CKTOTAL, CKMB, CKMBINDEX, TROPONINI in the last 168 hours. BNP (last 3 results) No results for input(s): PROBNP in the last 8760 hours. HbA1C: Recent Labs    10/21/19 0405  HGBA1C 7.6*   CBG: Recent Labs  Lab 10/20/19 1153 10/20/19 1852 10/20/19 2141 10/21/19 0612  GLUCAP 122* 123* 144* 142*   Lipid Profile: Recent Labs    10/21/19 0405  CHOL 150  HDL 47   LDLCALC 65  TRIG 192*  CHOLHDL 3.2   Thyroid Function Tests: No results for input(s): TSH, T4TOTAL, FREET4, T3FREE, THYROIDAB in the last 72 hours. Anemia Panel: No results for input(s): VITAMINB12, FOLATE, FERRITIN, TIBC, IRON, RETICCTPCT in the last 72 hours. Urine analysis:    Component Value Date/Time   COLORURINE YELLOW 10/20/2019 0954   APPEARANCEUR HAZY (A) 10/20/2019 0954   LABSPEC 1.010 10/20/2019 0954   PHURINE 7.0 10/20/2019 0954   GLUCOSEU 50 (A) 10/20/2019 0954   HGBUR NEGATIVE 10/20/2019 0954   BILIRUBINUR NEGATIVE 10/20/2019 0954   KETONESUR NEGATIVE 10/20/2019 0954   PROTEINUR 100 (A) 10/20/2019 0954   NITRITE NEGATIVE 10/20/2019 0954   LEUKOCYTESUR NEGATIVE 10/20/2019 0954   Sepsis Labs: @LABRCNTIP (procalcitonin:4,lacticidven:4)  ) Recent Results (from the past 240 hour(s))  SARS CORONAVIRUS 2 (TAT 6-24 HRS) Nasopharyngeal Nasopharyngeal Swab     Status: None   Collection Time: 10/20/19  7:30 AM   Specimen: Nasopharyngeal Swab  Result Value Ref Range Status   SARS Coronavirus 2 NEGATIVE NEGATIVE Final    Comment: (NOTE) SARS-CoV-2 target nucleic acids are NOT DETECTED. The SARS-CoV-2 RNA is generally detectable in upper and lower respiratory specimens during the acute phase of infection. Negative results do not preclude SARS-CoV-2 infection, do not rule out co-infections with other pathogens, and should not be used as the sole basis for treatment or other patient management decisions. Negative results must be combined with clinical observations, patient history, and epidemiological information. The expected result is Negative. Fact Sheet for Patients: SugarRoll.be Fact Sheet for Healthcare Providers: https://www.woods-mathews.com/ This test is not yet approved or cleared by the [place] FDA and  has been authorized for detection and/or diagnosis of SARS-CoV-2 by FDA under an Emergency Use Authorization  (EUA). This EUA will remain  in effect (meaning this test can be used) for the duration of the COVID-19 declaration under Section 56 4(b)(1) of the Act, 21 U.S.C. section 360bbb-3(b)(1), unless the authorization is terminated or revoked sooner. Performed at [facility], [place] [address], [place],  [address]          Radiology Studies: Dg Chest 1 View  Result Date: 10/20/2019 CLINICAL DATA:  Left-sided weakness EXAM: CHEST  1 VIEW COMPARISON:  September 09, 2019 FINDINGS: Right tunneled central line is unchanged. Low lung volumes. No new consolidation or edema. Possible trace left pleural effusion. Stable cardiomediastinal contours. IMPRESSION: Possible trace left pleural effusion. Electronically Signed   By: Macy Mis M.D.   On: 10/20/2019 11:45   Ct Head Wo Contrast  Result Date: 10/20/2019 CLINICAL DATA:  Focal neuro deficit, stroke suspected. Dizziness and weakness. EXAM: CT HEAD WITHOUT CONTRAST TECHNIQUE: Contiguous axial images were obtained from the base of the skull through the vertex without intravenous contrast. COMPARISON:  09/10/2019 FINDINGS: Brain: There are acute infarcts involving the head and body of  the right caudate nucleus and adjacent white matter. No intracranial hemorrhage, mass, midline shift, or extra-axial fluid collection is identified. The ventricles and sulci are within normal limits for age. Hypodensities in the cerebral white matter bilaterally are unchanged and nonspecific but compatible with mild chronic small vessel ischemic disease. Vascular: Calcified atherosclerosis at the skull base. No hyperdense vessel. Skull: No fracture or focal osseous lesion. Sinuses/Orbits: Visualized paranasal sinuses and mastoid air cells are clear. Right cataract extraction is noted. Other: None. IMPRESSION: 1. Acute right basal ganglia infarcts. 2. Mild chronic small vessel ischemic disease. Electronically Signed   By: Logan Bores M.D.   On: 10/20/2019 06:53    Mr Angio Head Wo Contrast  Result Date: 10/20/2019 CLINICAL DATA:  Left-sided weakness. EXAM: MRI HEAD WITHOUT CONTRAST MRA HEAD WITHOUT CONTRAST TECHNIQUE: Multiplanar, multiecho pulse sequences of the brain and surrounding structures were obtained without intravenous contrast. Angiographic images of the head were obtained using MRA technique without contrast. COMPARISON:  Head CT 10/20/2019 and MRI 07/19/2019 FINDINGS: MRI HEAD FINDINGS Brain: As seen on today's head CT, they are multiple acute infarcts involving the right basal ganglia and adjacent white matter. A few subcentimeter foci of mild trace diffusion weighted signal hyperintensity in the left parietal and posterior left frontal white matter are without reduced ADC and likely reflect subacute to chronic ischemia. A 4 mm cortical infarct in the left postcentral gyrus is new from the prior MRI but not acute. T2 hyperintensities elsewhere in the cerebral white matter bilaterally are unchanged and nonspecific but compatible with mild chronic small vessel ischemic disease. Small chronic infarcts in the left cerebellum and left thalamus are unchanged. Mild cerebral atrophy is within normal limits for age. No intracranial hemorrhage, mass, midline shift, or extra-axial fluid collection is identified. Vascular: Major intracranial vascular flow voids are preserved. Skull and upper cervical spine: No suspicious marrow lesion. Sinuses/Orbits: Right cataract extraction. No evidence of significant inflammatory disease in the paranasal sinuses. Left petrous apex effusion. Other: None. MRA HEAD FINDINGS The visualized distal vertebral arteries are patent to the basilar and codominant. Patent right PICA, bilateral AICA, and bilateral SCA origins are visualized. The basilar artery is widely patent. There is a large left posterior communicating artery with hypoplastic or absent left P1 segment, a normal variant. Both PCAs are patent without evidence of significant  proximal stenosis. The internal carotid arteries are patent from skull base to carotid termini without definite stenosis. Focally decreased signal in the right petrous ICA is likely artifactual related to pneumatization of the right petrous apex. ACAs and MCAs are patent without evidence of proximal branch occlusion or significant proximal stenosis. No aneurysm is identified. IMPRESSION: 1. Acute right basal ganglia infarcts. 2. Chronic ischemia with multiple old infarcts as above. 3. Negative head MRA. Electronically Signed   By: Logan Bores M.D.   On: 10/20/2019 11:46   Mr Brain Wo Contrast  Result Date: 10/20/2019 CLINICAL DATA:  Left-sided weakness. EXAM: MRI HEAD WITHOUT CONTRAST MRA HEAD WITHOUT CONTRAST TECHNIQUE: Multiplanar, multiecho pulse sequences of the brain and surrounding structures were obtained without intravenous contrast. Angiographic images of the head were obtained using MRA technique without contrast. COMPARISON:  Head CT 10/20/2019 and MRI 07/19/2019 FINDINGS: MRI HEAD FINDINGS Brain: As seen on today's head CT, they are multiple acute infarcts involving the right basal ganglia and adjacent white matter. A few subcentimeter foci of mild trace diffusion weighted signal hyperintensity in the left parietal and posterior left frontal white matter are without reduced ADC  and likely reflect subacute to chronic ischemia. A 4 mm cortical infarct in the left postcentral gyrus is new from the prior MRI but not acute. T2 hyperintensities elsewhere in the cerebral white matter bilaterally are unchanged and nonspecific but compatible with mild chronic small vessel ischemic disease. Small chronic infarcts in the left cerebellum and left thalamus are unchanged. Mild cerebral atrophy is within normal limits for age. No intracranial hemorrhage, mass, midline shift, or extra-axial fluid collection is identified. Vascular: Major intracranial vascular flow voids are preserved. Skull and upper cervical  spine: No suspicious marrow lesion. Sinuses/Orbits: Right cataract extraction. No evidence of significant inflammatory disease in the paranasal sinuses. Left petrous apex effusion. Other: None. MRA HEAD FINDINGS The visualized distal vertebral arteries are patent to the basilar and codominant. Patent right PICA, bilateral AICA, and bilateral SCA origins are visualized. The basilar artery is widely patent. There is a large left posterior communicating artery with hypoplastic or absent left P1 segment, a normal variant. Both PCAs are patent without evidence of significant proximal stenosis. The internal carotid arteries are patent from skull base to carotid termini without definite stenosis. Focally decreased signal in the right petrous ICA is likely artifactual related to pneumatization of the right petrous apex. ACAs and MCAs are patent without evidence of proximal branch occlusion or significant proximal stenosis. No aneurysm is identified. IMPRESSION: 1. Acute right basal ganglia infarcts. 2. Chronic ischemia with multiple old infarcts as above. 3. Negative head MRA. Electronically Signed   By: Logan Bores M.D.   On: 10/20/2019 11:46        Scheduled Meds:  acetaminophen  1,000 mg Oral Once   aspirin EC  81 mg Oral QPC lunch   calcium carbonate  1 tablet Oral 2 times per day   Chlorhexidine Gluconate Cloth  6 each Topical Daily   clopidogrel  75 mg Oral Daily   [START ON 10/23/2019] doxercalciferol  1.5 mcg Intravenous Q M,W,F-HD   gabapentin  100 mg Oral QHS   heparin  5,000 Units Subcutaneous Q8H   insulin aspart  0-5 Units Subcutaneous QHS   insulin aspart  0-9 Units Subcutaneous TID WC   isosorbide mononitrate  30 mg Oral QPC lunch   multivitamin  1 tablet Oral QPC lunch   Continuous Infusions:  sodium chloride Stopped (10/20/19 1317)     LOS: 1 day    Time spent: 49min    Domenic Polite, MD [facility]  10/21/2019, 11:52 AM

## 2019-10-21 NOTE — Progress Notes (Signed)
Per chart review, patient receiving doppler to r/o DVT this morning. Will follow acutely and initiate PT eval as appropriate pending results of doppler.   Windell Norfolk, DPT, PN1   Supplemental Physical Therapist [facility]    Pager [phone] Acute Rehab Office [phone]

## 2019-10-21 NOTE — Progress Notes (Signed)
[facility] informed of pt's temp 101.17F.

## 2019-10-21 NOTE — Progress Notes (Addendum)
Carotid artery duplex, right upper extremity venous duplex, bilateral lower extremity venous duplex.  Critical results discussed with Dr. Erlinda Hong @ 11:01 10/21/2019   10/21/2019 11:03 AM Maudry Mayhew, MHA, RVT, RDCS, RDMS

## 2019-10-22 ENCOUNTER — Inpatient Hospital Stay (HOSPITAL_COMMUNITY): Payer: Medicare PPO

## 2019-10-22 DIAGNOSIS — I82621 Acute embolism and thrombosis of deep veins of right upper extremity: Secondary | ICD-10-CM

## 2019-10-22 DIAGNOSIS — I10 Essential (primary) hypertension: Secondary | ICD-10-CM

## 2019-10-22 DIAGNOSIS — I639 Cerebral infarction, unspecified: Secondary | ICD-10-CM

## 2019-10-22 LAB — CBC
HCT: 30 % — ABNORMAL LOW (ref 36.0–46.0)
Hemoglobin: 9.6 g/dL — ABNORMAL LOW (ref 12.0–15.0)
MCH: 28.2 pg (ref 26.0–34.0)
MCHC: 32 g/dL (ref 30.0–36.0)
MCV: 88.2 fL (ref 80.0–100.0)
Platelets: 274 10*3/uL (ref 150–400)
RBC: 3.4 MIL/uL — ABNORMAL LOW (ref 3.87–5.11)
RDW: 17.5 % — ABNORMAL HIGH (ref 11.5–15.5)
WBC: 13.3 10*3/uL — ABNORMAL HIGH (ref 4.0–10.5)
nRBC: 0 % (ref 0.0–0.2)

## 2019-10-22 LAB — RENAL FUNCTION PANEL
Albumin: 2.3 g/dL — ABNORMAL LOW (ref 3.5–5.0)
Anion gap: 18 — ABNORMAL HIGH (ref 5–15)
BUN: 74 mg/dL — ABNORMAL HIGH (ref 8–23)
CO2: 22 mmol/L (ref 22–32)
Calcium: 8.5 mg/dL — ABNORMAL LOW (ref 8.9–10.3)
Chloride: 98 mmol/L (ref 98–111)
Creatinine, Ser: 10.27 mg/dL — ABNORMAL HIGH (ref 0.44–1.00)
GFR calc Af Amer: 4 mL/min — ABNORMAL LOW (ref >60)
GFR calc non Af Amer: 3 mL/min — ABNORMAL LOW (ref >60)
Glucose, Bld: 148 mg/dL — ABNORMAL HIGH (ref 70–99)
Phosphorus: 4.3 mg/dL (ref 2.5–4.6)
Potassium: 4.8 mmol/L (ref 3.5–5.1)
Sodium: 138 mmol/L (ref 135–145)

## 2019-10-22 LAB — GLUCOSE, CAPILLARY
Glucose-Capillary: 143 mg/dL — ABNORMAL HIGH (ref 70–99)
Glucose-Capillary: 81 mg/dL (ref 70–99)
Glucose-Capillary: 209 mg/dL — ABNORMAL HIGH (ref 70–99)
Glucose-Capillary: 198 mg/dL — ABNORMAL HIGH (ref 70–99)

## 2019-10-22 LAB — HEPARIN LEVEL (UNFRACTIONATED): Heparin Unfractionated: 0.33 IU/mL (ref 0.30–0.70)

## 2019-10-22 MED ORDER — HEPARIN (PORCINE) 25000 UT/250ML-% IV SOLN
1300.0000 [IU]/h | INTRAVENOUS | Status: DC
  Administered 2019-10-22: 800 [IU]/h via INTRAVENOUS
  Administered 2019-10-24: 900 [IU]/h via INTRAVENOUS
  Administered 2019-10-25 – 2019-10-28 (×2): 1150 [IU]/h via INTRAVENOUS
  Administered 2019-10-29: 1300 [IU]/h via INTRAVENOUS
  Administered 2019-10-29: 1150 [IU]/h via INTRAVENOUS
  Administered 2019-10-29 – 2019-10-30 (×2): 1300 [IU]/h via INTRAVENOUS
  Filled 2019-10-22 (×10): qty 250

## 2019-10-22 MED ORDER — CHLORHEXIDINE GLUCONATE CLOTH 2 % EX PADS
6.0000 | MEDICATED_PAD | Freq: Every day | CUTANEOUS | Status: DC
  Administered 2019-10-23 – 2019-11-01 (×10): 6 via TOPICAL

## 2019-10-22 NOTE — Progress Notes (Signed)
Rehab Admissions Coordinator Note:  Per PT recommendation, patient was screened by Michel Santee for appropriateness for an Inpatient Acute Rehab Consult.  At this time, we are recommending Inpatient Rehab consult.  I will place a rehab consult order so that we are better able to assess pt for candidacy.   Michel Santee 10/22/2019, 12:26 PM  I can be reached at [phone].

## 2019-10-22 NOTE — Progress Notes (Signed)
STROKE TEAM PROGRESS NOTE   INTERVAL HISTORY Her vascular lab technician is at the bedside.  Pt lying in bed, awake alert, left hemiparesis is improving. UE venous doppler showed acute right IJ and age-indetermined brachial vein DVT. TCD bubble study negative but lack of effort on valsalva.   OBJECTIVE Vitals:   10/22/19 0719 10/22/19 0844 10/22/19 1059 10/22/19 1137  BP: (!) 164/88   (!) 156/79  Pulse: (!) 117   88  Resp: 18   20  Temp: (!) 101.9 F (38.8 C) (!) 100.7 F (38.2 C) 99 F (37.2 C) 99.4 F (37.4 C)  TempSrc: Oral Oral Oral Oral  SpO2: 100%   100%  Weight:      Height:        CBC:  Recent Labs  Lab 10/20/19 0659 10/21/19 0405 10/22/19 0735  WBC 15.7* 10.9* 13.3*  NEUTROABS 12.4*  --   --   HGB 9.7* 8.6* 9.6*  HCT 32.8* 27.4* 30.0*  MCV 91.6 88.4 88.2  PLT 348 274 [id number]    Basic Metabolic Panel:  Recent Labs  Lab 10/21/19 0405 10/22/19 0735  NA 140 138  K 4.9 4.8  CL 100 98  CO2 24 22  GLUCOSE 131* 148*  BUN 62* 74*  CREATININE 8.86* 10.27*  CALCIUM 8.4* 8.5*  PHOS 4.4 4.3    Lipid Panel:     Component Value Date/Time   CHOL 150 10/21/2019 0405   TRIG 192 (H) 10/21/2019 0405   HDL 47 10/21/2019 0405   CHOLHDL 3.2 10/21/2019 0405   VLDL 38 10/21/2019 0405   LDLCALC 65 10/21/2019 0405   HgbA1c:  Lab Results  Component Value Date   HGBA1C 7.6 (H) 10/21/2019   Urine Drug Screen:     Component Value Date/Time   LABOPIA NONE DETECTED 07/19/2019 1220   COCAINSCRNUR NONE DETECTED 07/19/2019 1220   LABBENZ NONE DETECTED 07/19/2019 1220   AMPHETMU NONE DETECTED 07/19/2019 1220   THCU NONE DETECTED 07/19/2019 1220   LABBARB NONE DETECTED 07/19/2019 1220    Alcohol Level     Component Value Date/Time   ETH <10 07/19/2019 1405    IMAGING  Dg Chest 1 View 10/20/2019 IMPRESSION:  Possible trace left pleural effusion.   Ct Head Wo Contrast 10/20/2019 IMPRESSION:  1. Acute right basal ganglia infarcts.  2. Mild chronic small  vessel ischemic disease.   Mr Brain 18 Contrast Mr Angio Head Wo Contrast 10/20/2019 IMPRESSION:  1. Acute right basal ganglia infarcts.  2. Chronic ischemia with multiple old infarcts as above.  3. Negative head MRA.   TTE 10/20/2019 Impression 1. Left ventricular ejection fraction, by visual estimation, is 60 to 65%. The left ventricle has normal function. There is mildly increased left ventricular hypertrophy. 2. Left ventricular diastolic parameters are consistent with Grade I diastolic dysfunction (impaired relaxation). 3. Global right ventricle has normal systolic function.The right ventricular size is normal. 4. Right atrial size was normal. 5. Trivial pericardial effusion is present. 6. Mild mitral annular calcification. 7. The mitral valve is normal in structure. Trace mitral valve regurgitation. No evidence of mitral stenosis. 8. The tricuspid valve is normal in structure. Tricuspid valve regurgitation is mild. 9. The aortic valve is tricuspid. Aortic valve regurgitation is not visualized. Mild to moderate aortic valve sclerosis/calcification without any evidence of aortic stenosis. 10. The pulmonic valve was normal in structure. Pulmonic valve regurgitation is not visualized. 11. The inferior vena cava is normal in size with greater than 50% respiratory variability, suggesting right  atrial pressure of 3 mmHg. 12. Normal LV systolic function; grade 1 diastolic dysfunction; mild LVH; mild LAE.   Bilateral Carotid Dopplers  Right Carotid: Velocities in the right ICA are consistent with a 1-39% stenosis. Left Carotid: Velocities in the left ICA are consistent with a 1-39% stenosis. Vertebrals:  Bilateral vertebral arteries demonstrate antegrade flow. Subclavians: Normal flow hemodynamics were seen in bilateral subclavian              arteries.  Bilateral Lower Extremity Venous Dopplers Right: There is no evidence of deep vein thrombosis in the lower extremity. Left:  There is no evidence of deep vein thrombosis in the lower extremity. However, portions of this examination were limited- see technologist comments above.  Bilateral upper Extremity Venous Dopplers Right: No evidence of superficial vein thrombosis in the upper extremity. Findings consistent with acute deep vein thrombosis involving the right internal jugular vein. Findings consistent with age indeterminate deep vein thrombosis involving the one of the paired veins of the right brachial veins. Left: No evidence of thrombosis in the subclavian.  ECG - SR rate 83 BPM. (See cardiology reading for complete details)  Transcranial Dopplers 10/21/19 Summary: A vascular evaluation was performed. The right ICA siphon was studied. An IV was inserted into the patient's right forearm. Verbal informed consent was obtained. No obvious evidence of high intensity transient signals (HITS), therefore no evidence of clinically significant patent foramen ovale (PFO).  PHYSICAL EXAM  Temp:  [98.6 F (37 C)-101.9 F (38.8 C)] 99.4 F (37.4 C) (11/29 1137) Pulse Rate:  [79-117] 88 (11/29 1137) Resp:  [18-24] 20 (11/29 1137) BP: (140-184)/(79-93) 156/79 (11/29 1137) SpO2:  [93 %-100 %] 100 % (11/29 1137) Weight:  [66.3 kg] 66.3 kg (11/29 0349)  General - Well nourished, well developed, in no apparent distress, mild lethargic.  Ophthalmologic - fundi not visualized due to noncooperation.  Cardiovascular - Regular rhythm and rate.  Neuro - mildly lethargic, but awake alert, eyes open, mild abulia. Orientated to time place and people. No aphasia, following all simple commands, able to name and repeat. PERRL, visual field full, EOMI, no gaze preference. Mild left facial asymmetry, tongue midline. LUE 4/5 with pronator drift, finger grip 4/5. LLE proximal 3/5, knee flexion 4/5, foot DF and PF 4/5. Sensation symmetrical bilaterally, FTN intact bilaterally although slow on the left. DTR 1+ on the left and no  babinski. Gait not tested.    ASSESSMENT/PLAN [name] is a 68 y.o. female with history of ESRD ( on dialysis M-W-F), TIA 2010, HTN, HLD, Ha, DM2 who presented to [facility] with c/o dizziness, left arm weakness and left leg numbness. She did not receive IV t-PA due to late presentation (>4.5 hours from time of onset)  Stroke:  acute right BG, CR, caudate scattered infarcts, embolic pattern, source unclear  CT head - Acute right basal ganglia infarcts.   MRI head -  Acute right BG, CR, caudate scattered infarcts. Chronic ischemia with multiple old infarcts as above.   MRA head - Negative   Carotid Doppler unremarkable  2D Echo - EF 60 - 65%  Bilateral LE Venous Dopplers no DVT  Bilateral UE venous doppler - right IJ acute DVT, right brachial vein age-indetermined DVT  TCD bubble study - no PFO but lack of effort on valsalva.  Recommend outpt 30 day cardiac event monitoring to rule out afib  LDL - 65  HgbA1c - 7.6  VTE prophylaxis - [name] Heparin  aspirin 81 mg daily  and clopidogrel 75 mg daily prior to admission, now on IV heparin.   Patient counseled to be compliant with her antithrombotic medications  Ongoing aggressive stroke risk factor management  Therapy recommendations:  CIR  Disposition:  Pending  Right IJ acute DVT  Found on UE venous Doppler  Could be related to dialysis catheter  On heparin IV   Treatment as per primary team  ESRD on HD  04/2019 right arm AV graft removed  Cre 7.76->8.86->10.27  MWF schedule  Nephrology on board  Hx of bacteremia  06/2019 -admitted for altered mental status, blood culture showed positive gram negative rods, confirmed for Pseudomonas infection, concerning for coming from left 3-4 discitis/osteomyelitis.  Treated with cefepime initially and then transition to [facility] for 6 weeks.  08/2019 blood culture repeat negative.  Hypertension  Home BP meds: Coreg ; Norvasc ; Imdur  Current  BP meds: Imdur  BP stable but on the higher end  Permissive hypertension (OK if <180/105) but gradually normalize in 5-7 days   Long-term BP goal normotensive  Diabetes  Home diabetic meds: insulin  Current diabetic meds: insulin  HgbA1c 7.6, goal < 7.0  SSI  CBG monitoring  Close PCP follow-up  Fever   Tmax 101.9->100.7  WBC 15.7->10.9->13.3   blood cultures pending  UA WBC 6-10  Not on Abx yet  Other Stroke Risk Factors  Advanced age  Hx TIA in 2010  Other Active Problems  Anemia due to ESRD - Hb - 9.7->8.6->9.6   Hospital day # 2   Rosalin Hawking, MD PhD Stroke Neurology 10/22/2019 3:34 PM   To contact Stroke Continuity provider, please refer to [email or web address] After hours, contact General Neurology

## 2019-10-22 NOTE — Progress Notes (Signed)
PROGRESS NOTE    Tigerlily Lumia  [id number] DOB: March 10, 1951 DOA: 10/20/2019 PCP: Manon Hilding, MD  Brief Narrative: Elody Gerhold is a 68 y.o. female with medical history of ESRD (MWF), diabetes mellitus type 2, stroke, hypertension, hyperlipidemia presenting with left-sided weakness.  The patient was her usual self around 9 PM when she went to bed on the evening of 10/19/2019.  However when she woke up to get ready for dialysis at 4 AM on 10/20/2019, the patient noted left-sided weakness and had difficulty ambulating -MRI noted right basal ganglia infarcts -Hospitalization complicated by fevers of 101, and finding of right IJ DVT on Dopplers  Assessment & Plan:    Acute ischemic stroke -MRI notes right basal ganglia infarcts, continues to have significant left-sided deficits -2D echocardiogram with preserved EF, no cardiac source of embolus -Carotid duplex is unremarkable, no PFO noted on TCD bubble study -Patient was on aspirin and Plavix at baseline which was continued however given concomitant new finding of acute DVT in her right IJ she will be started on IV heparin without bolus, discussed with neurology Dr. Erlinda Hong as well as nephrology Dr. Royce Macadamia -Stop aspirin and Plavix -Hemoglobin A1c is 7.6, LDL is 83 -Neurology consulting -PT OT, SLP evaluation -Anticipate she will need short-term rehab  Fever of 101 -Could be related to DVT, bacteremia is also possibility given HD catheter -Check blood cultures -If declines hemodynamically will start empiric antibiotics and monitor  Right IJ acute DVT -This is secondary to her HD catheter, discussed with renal, in the absence of bacteremia plan to continue her HD catheter given extremely limited dialysis access -Treat with anticoagulation, given ESRD options are limited, will start IV heparin without bolus and aim for a lower PTT, start Coumadin only once we are certain that she does not require any procedures and bacteremia is ruled  out  ESRD -Nephrology consulted, usually dialyzes Monday Wednesday Friday -She has a right IJ HD catheter, see discussion above  Diabetes mellitus type 2 -Hemoglobin A1c is 7.6 -NovoLog sliding scale  Essential hypertension -Holding amlodipine and carvedilol to allow for permissive hypertension -hydralazine prn SBP >220  Anemia of chronic disease -Per renal  DVT prophylaxis: Heparin IV Code Status: Full code Family Communication: No family at bedside Disposition Plan: Will likely need short-term rehab  Consultants:   Neurology, renal   Procedures:   Antimicrobials:    Subjective: -Feels weak, febrile to 101 last night and this morning  Objective: Vitals:   10/22/19 0719 10/22/19 0844 10/22/19 1059 10/22/19 1137  BP: (!) 164/88   (!) 156/79  Pulse: (!) 117   88  Resp: 18   20  Temp: (!) 101.9 F (38.8 C) (!) 100.7 F (38.2 C) 99 F (37.2 C) 99.4 F (37.4 C)  TempSrc: Oral Oral Oral Oral  SpO2: 100%   100%  Weight:      Height:        Intake/Output Summary (Last 24 hours) at 10/22/2019 1206 Last data filed at 10/22/2019 [id number] Gross per 24 hour  Intake 40 ml  Output 325 ml  Net -285 ml   Filed Weights   10/20/19 0529 10/22/19 0349  Weight: 63.5 kg 66.3 kg    Examination:  General exam: Elderly frail chronically ill female oriented x3, ill-appearing  respiratory system: Poor air movement bilaterally, left IJ HD catheter noted Cardiovascular system: S1-S2/regular rate rhythm Gastrointestinal system: Soft nontender, nondistended, bowel sounds present Central nervous system: Awake and alert, oriented x3, left upper and lower extremity  is 3/5  Extremities: No edema Skin: No rashes, lesions or ulcers Psychiatry: Insight and judgment appear normal    Data Reviewed:   CBC: Recent Labs  Lab 10/20/19 0659 10/21/19 0405 10/22/19 0735  WBC 15.7* 10.9* 13.3*  NEUTROABS 12.4*  --   --   HGB 9.7* 8.6* 9.6*  HCT 32.8* 27.4* 30.0*  MCV 91.6  88.4 88.2  PLT 348 274 [id number]   Basic Metabolic Panel: Recent Labs  Lab 10/20/19 0659 10/21/19 0405 10/22/19 0735  NA 139 140 138  K 4.4 4.9 4.8  CL 97* 100 98  CO2 23 24 22   GLUCOSE 139* 131* 148*  BUN 56* 62* 74*  CREATININE 7.76* 8.86* 10.27*  CALCIUM 8.7* 8.4* 8.5*  PHOS  --  4.4 4.3   GFR: Estimated Creatinine Clearance: 5 mL/min (A) (by C-G formula based on SCr of 10.27 mg/dL (H)). Liver Function Tests: Recent Labs  Lab 10/20/19 0659 10/21/19 0405 10/22/19 0735  AST 12*  --   --   ALT 16  --   --   ALKPHOS 75  --   --   BILITOT 0.7  --   --   PROT 7.9  --   --   ALBUMIN 2.8* 2.1* 2.3*   No results for input(s): LIPASE, AMYLASE in the last 168 hours. No results for input(s): AMMONIA in the last 168 hours. Coagulation Profile: Recent Labs  Lab 10/20/19 0659  INR 1.0   Cardiac Enzymes: No results for input(s): CKTOTAL, CKMB, CKMBINDEX, TROPONINI in the last 168 hours. BNP (last 3 results) No results for input(s): PROBNP in the last 8760 hours. HbA1C: Recent Labs    10/21/19 0405  HGBA1C 7.6*   CBG: Recent Labs  Lab 10/21/19 1246 10/21/19 1807 10/21/19 2122 10/22/19 0601 10/22/19 1140  GLUCAP 117* 154* 142* 143* 81   Lipid Profile: Recent Labs    10/21/19 0405  CHOL 150  HDL 47  LDLCALC 65  TRIG 192*  CHOLHDL 3.2   Thyroid Function Tests: No results for input(s): TSH, T4TOTAL, FREET4, T3FREE, THYROIDAB in the last 72 hours. Anemia Panel: No results for input(s): VITAMINB12, FOLATE, FERRITIN, TIBC, IRON, RETICCTPCT in the last 72 hours. Urine analysis:    Component Value Date/Time   COLORURINE YELLOW 10/20/2019 0954   APPEARANCEUR HAZY (A) 10/20/2019 0954   LABSPEC 1.010 10/20/2019 0954   PHURINE 7.0 10/20/2019 0954   GLUCOSEU 50 (A) 10/20/2019 0954   HGBUR NEGATIVE 10/20/2019 0954   BILIRUBINUR NEGATIVE 10/20/2019 0954   KETONESUR NEGATIVE 10/20/2019 0954   PROTEINUR 100 (A) 10/20/2019 0954   NITRITE NEGATIVE 10/20/2019 0954    LEUKOCYTESUR NEGATIVE 10/20/2019 0954   Sepsis Labs: @LABRCNTIP (procalcitonin:4,lacticidven:4)  ) Recent Results (from the past 240 hour(s))  SARS CORONAVIRUS 2 (TAT 6-24 HRS) Nasopharyngeal Nasopharyngeal Swab     Status: None   Collection Time: 10/20/19  7:30 AM   Specimen: Nasopharyngeal Swab  Result Value Ref Range Status   SARS Coronavirus 2 NEGATIVE NEGATIVE Final    Comment: (NOTE) SARS-CoV-2 target nucleic acids are NOT DETECTED. The SARS-CoV-2 RNA is generally detectable in upper and lower respiratory specimens during the acute phase of infection. Negative results do not preclude SARS-CoV-2 infection, do not rule out co-infections with other pathogens, and should not be used as the sole basis for treatment or other patient management decisions. Negative results must be combined with clinical observations, patient history, and epidemiological information. The expected result is Negative. Fact Sheet for Patients: SugarRoll.be Fact Sheet for Healthcare  Providers: https://www.woods-mathews.com/ This test is not yet approved or cleared by the [place] and  has been authorized for detection and/or diagnosis of SARS-CoV-2 by FDA under an Emergency Use Authorization (EUA). This EUA will remain  in effect (meaning this test can be used) for the duration of the COVID-19 declaration under Section 56 4(b)(1) of the Act, 21 U.S.C. section 360bbb-3(b)(1), unless the authorization is terminated or revoked sooner. Performed at [facility], [place] [address], [place], [place] [address]   Culture, blood (routine x 2)     Status: None (Preliminary result)   Collection Time: 10/22/19  7:35 AM   Specimen: BLOOD  Result Value Ref Range Status   Specimen Description BLOOD RIGHT ANTECUBITAL  Final   Special Requests   Final    BOTTLES DRAWN AEROBIC ONLY Blood Culture adequate volume   Culture   Final    NO GROWTH < 12 HOURS Performed  at [facility], [place] [address] [place], [place] [address]    Report Status PENDING  Incomplete  Culture, blood (routine x 2)     Status: None (Preliminary result)   Collection Time: 10/22/19  7:43 AM   Specimen: BLOOD LEFT HAND  Result Value Ref Range Status   Specimen Description BLOOD LEFT HAND  Final   Special Requests   Final    BOTTLES DRAWN AEROBIC ONLY Blood Culture adequate volume   Culture   Final    NO GROWTH < 12 HOURS Performed at [facility], [place] [address] [place], [place] [address]    Report Status PENDING  Incomplete         Radiology Studies: Vas [name] Transcranial Doppler W Bubbles  Result Date: 10/22/2019  Transcranial Doppler with Bubble Indications: Stroke. Comparison Study: No prior study. Performing Technologist: Maudry Mayhew MHA, RDMS, RVT, RDCS  Examination Guidelines: A complete evaluation includes B-mode imaging, spectral Doppler, color Doppler, and power Doppler as needed of all accessible portions of each vessel. Bilateral testing is considered an integral part of a complete examination. Limited examinations for reoccurring indications may be performed as noted.  Summary:  A vascular evaluation was performed. The right ICA siphon was studied. An IV was inserted into the patient's right forearm. Verbal informed consent was obtained.  No obvious evidence of high intensity transient signals (HITS), therefore no evidence of clinically significant patent foramen ovale (PFO). *See table(s) above for measurements and observations.    Preliminary    Vas US Carotid  Result Date: 10/21/2019 Carotid Arterial Duplex Study Indications:       CVA and Weakness. Risk Factors:      Hypertension, hyperlipidemia, Diabetes, prior CVA. Other Factors:     ESRD. Comparison Study:  No prior study on file for comparison Performing Technologist: Maudry Mayhew RDMS, RVT, RDCS  Examination Guidelines: A complete evaluation includes B-mode imaging, spectral  Doppler, color Doppler, and power Doppler as needed of all accessible portions of each vessel. Bilateral testing is considered an integral part of a complete examination. Limited examinations for reoccurring indications may be performed as noted.  Right Carotid Findings: +----------+--------+--------+--------+------------------+------------------+             PSV cm/s EDV cm/s Stenosis Plaque Description Comments            +----------+--------+--------+--------+------------------+------------------+  CCA Prox   78       14  intimal thickening  +----------+--------+--------+--------+------------------+------------------+  CCA Distal 70       11                                   intimal thickening  +----------+--------+--------+--------+------------------+------------------+  ICA Prox   54       13                calcific           Shadowing           +----------+--------+--------+--------+------------------+------------------+  ICA Distal 79       24                                                       +----------+--------+--------+--------+------------------+------------------+  ECA        74       4                                                        +----------+--------+--------+--------+------------------+------------------+ +----------+--------+-------+--------+-------------------+             PSV cm/s EDV cms Describe Arm Pressure (mmHG)  +----------+--------+-------+--------+-------------------+  Subclavian 100                                            +----------+--------+-------+--------+-------------------+ +---------+--------+--+--------+--+  Vertebral PSV cm/s 62 EDV cm/s 10  +---------+--------+--+--------+--+  Left Carotid Findings: +----------+--------+--------+--------+------------------+------------------+             PSV cm/s EDV cm/s Stenosis Plaque Description Comments            +----------+--------+--------+--------+------------------+------------------+   CCA Prox   111      9                                    intimal thickening  +----------+--------+--------+--------+------------------+------------------+  CCA Distal 76       12                                   intimal thickening  +----------+--------+--------+--------+------------------+------------------+  ICA Prox   55       15                heterogenous       Shadowing           +----------+--------+--------+--------+------------------+------------------+  ICA Distal 97       27                                                       +----------+--------+--------+--------+------------------+------------------+  ECA        67       9                                                        +----------+--------+--------+--------+------------------+------------------+ +----------+--------+--------+--------+-------------------+  PSV cm/s EDV cm/s Describe Arm Pressure (mmHG)  +----------+--------+--------+--------+-------------------+  Subclavian 145                                             +----------+--------+--------+--------+-------------------+ +---------+--------+--+--------+--+  Vertebral PSV cm/s 38 EDV cm/s 10  +---------+--------+--+--------+--+  Summary: Right Carotid: Velocities in the right ICA are consistent with a 1-39% stenosis. Left Carotid: Velocities in the left ICA are consistent with a 1-39% stenosis. Vertebrals:  Bilateral vertebral arteries demonstrate antegrade flow. Subclavians: Normal flow hemodynamics were seen in bilateral subclavian              arteries. *See table(s) above for measurements and observations.     Preliminary    Vas [name] Lower Extremity Venous (dvt)  Result Date: 10/22/2019  Lower Venous Study Indications: Stroke.  Limitations: Body habitus. Comparison Study: No prior study on file for comparison Performing Technologist: Maudry Mayhew RDMS, RVT, RDCS  Examination Guidelines: A complete evaluation includes B-mode imaging, spectral Doppler, color  Doppler, and power Doppler as needed of all accessible portions of each vessel. Bilateral testing is considered an integral part of a complete examination. Limited examinations for reoccurring indications may be performed as noted.  +---------+---------------+---------+-----------+----------+--------------+  RIGHT     Compressibility Phasicity Spontaneity Properties Thrombus Aging  +---------+---------------+---------+-----------+----------+--------------+  CFV       Full            Yes       Yes                                    +---------+---------------+---------+-----------+----------+--------------+  SFJ       Full                                                             +---------+---------------+---------+-----------+----------+--------------+  FV Prox   Full                                                             +---------+---------------+---------+-----------+----------+--------------+  FV Mid    Full                                                             +---------+---------------+---------+-----------+----------+--------------+  FV Distal Full                                                             +---------+---------------+---------+-----------+----------+--------------+  PFV       Full                                                             +---------+---------------+---------+-----------+----------+--------------+  POP       Full            No        No                                     +---------+---------------+---------+-----------+----------+--------------+  PTV       Full                                                             +---------+---------------+---------+-----------+----------+--------------+  PERO      Full                                                             +---------+---------------+---------+-----------+----------+--------------+   +---------+---------------+---------+-----------+----------+--------------+  LEFT       Compressibility Phasicity Spontaneity Properties Thrombus Aging  +---------+---------------+---------+-----------+----------+--------------+  CFV       Full            Yes       Yes                                    +---------+---------------+---------+-----------+----------+--------------+  SFJ       Full                                                             +---------+---------------+---------+-----------+----------+--------------+  FV Prox   Full                                                             +---------+---------------+---------+-----------+----------+--------------+  FV Mid    Full                                                             +---------+---------------+---------+-----------+----------+--------------+  FV Distal Full                                                             +---------+---------------+---------+-----------+----------+--------------+  PFV       Full                                                             +---------+---------------+---------+-----------+----------+--------------+  POP       Full            Yes       Yes                                    +---------+---------------+---------+-----------+----------+--------------+  PTV                                                        Not visualized  +---------+---------------+---------+-----------+----------+--------------+  PERO                                                       Not visualized  +---------+---------------+---------+-----------+----------+--------------+   Left Technical Findings: Not visualized segments include Posterior and peroneal veins.   Summary: Right: There is no evidence of deep vein thrombosis in the lower extremity. Left: There is no evidence of deep vein thrombosis in the lower extremity. However, portions of this examination were limited- see technologist comments above.  *See table(s) above for measurements and observations. Electronically signed by Curt Jews MD on  10/22/2019 at 7:49:26 AM.    Final    Vas [name] Upper Extremity Venous Duplex  Result Date: 10/22/2019 UPPER VENOUS STUDY  Indications: Incidental finding of IJ thrombus while performing carotid duplex. Stroke. Comparison Study: No prior study on file Performing Technologist: Maudry Mayhew RDMS, RVT, RDCS  Examination Guidelines: A complete evaluation includes B-mode imaging, spectral Doppler, color Doppler, and power Doppler as needed of all accessible portions of each vessel. Bilateral testing is considered an integral part of a complete examination. Limited examinations for reoccurring indications may be performed as noted.  Right Findings: +----------+------------+---------+-----------+----------+-----------------+  RIGHT      Compressible Phasicity Spontaneous Properties      Summary       +----------+------------+---------+-----------+----------+-----------------+  IJV            None        No         No                       Acute        +----------+------------+---------+-----------+----------+-----------------+  Subclavian     Full        Yes        Yes                                   +----------+------------+---------+-----------+----------+-----------------+  Axillary       Full        Yes        Yes                                   +----------+------------+---------+-----------+----------+-----------------+  Brachial     Partial       No         No                 Age Indeterminate  +----------+------------+---------+-----------+----------+-----------------+  Radial         Full                                                         +----------+------------+---------+-----------+----------+-----------------+  Ulnar          Full                                                         +----------+------------+---------+-----------+----------+-----------------+  Cephalic       Full                                                          +----------+------------+---------+-----------+----------+-----------------+  Basilic                                                   Not visualized    +----------+------------+---------+-----------+----------+-----------------+  Left Findings: +----------+------------+---------+-----------+----------+-------+  LEFT       Compressible Phasicity Spontaneous Properties Summary  +----------+------------+---------+-----------+----------+-------+  Subclavian                 Yes        Yes                         +----------+------------+---------+-----------+----------+-------+  Summary:  Right: No evidence of superficial vein thrombosis in the upper extremity. Findings consistent with acute deep vein thrombosis involving the right internal jugular vein. Findings consistent with age indeterminate deep vein thrombosis involving the one of the paired veins of the right brachial veins.  Left: No evidence of thrombosis in the subclavian.  *See table(s) above for measurements and observations.  Diagnosing physician: Curt Jews MD Electronically signed by Curt Jews MD on 10/22/2019 at 7:49:40 AM.    Final         Scheduled Meds:  acetaminophen  1,000 mg Oral Once   aspirin EC  81 mg Oral QPC lunch   calcium carbonate  1 tablet Oral 2 times per day   Chlorhexidine Gluconate Cloth  6 each Topical Daily   clopidogrel  75 mg Oral Daily   [START ON 10/23/2019] doxercalciferol  1.5 mcg Intravenous Q M,W,F-HD   gabapentin  100 mg Oral QHS   heparin  5,000 Units Subcutaneous Q8H   insulin aspart  0-5 Units Subcutaneous QHS   insulin aspart  0-9 Units Subcutaneous TID WC   isosorbide mononitrate  30 mg Oral QPC lunch   multivitamin  1 tablet Oral QPC lunch   Continuous Infusions:  sodium chloride Stopped (10/20/19 1317)     LOS: 2 days    Time spent: 33min    Domenic Polite, MD [facility]  10/22/2019, 12:06 PM

## 2019-10-22 NOTE — Plan of Care (Signed)
  Problem: Education: Goal: Knowledge of General Education information will improve Description Including pain rating scale, medication(s)/side effects and non-pharmacologic comfort measures Outcome: Progressing   Problem: Health Behavior/Discharge Planning: Goal: Ability to manage health-related needs will improve Outcome: Progressing   Problem: Clinical Measurements: Goal: Ability to maintain clinical measurements within normal limits will improve Outcome: Progressing Goal: Will remain free from infection Outcome: Progressing Goal: Diagnostic test results will improve Outcome: Progressing Goal: Respiratory complications will improve Outcome: Progressing Goal: Cardiovascular complication will be avoided Outcome: Progressing   Problem: Activity: Goal: Risk for activity intolerance will decrease Outcome: Progressing   Problem: Nutrition: Goal: Adequate nutrition will be maintained Outcome: Progressing   Problem: Coping: Goal: Level of anxiety will decrease Outcome: Progressing   Problem: Elimination: Goal: Will not experience complications related to bowel motility Outcome: Progressing Goal: Will not experience complications related to urinary retention Outcome: Progressing   Problem: Pain Managment: Goal: General experience of comfort will improve Outcome: Progressing   Problem: Safety: Goal: Ability to remain free from injury will improve Outcome: Progressing   Problem: Skin Integrity: Goal: Risk for impaired skin integrity will decrease Outcome: Progressing   Problem: Education: Goal: Knowledge of disease or condition will improve Outcome: Progressing Goal: Knowledge of secondary prevention will improve Outcome: Progressing Goal: Knowledge of patient specific risk factors addressed and post discharge goals established will improve Outcome: Progressing Goal: Individualized Educational Video(s) Outcome: Progressing   Problem: Coping: Goal: Will verbalize  positive feelings about self Outcome: Progressing Goal: Will identify appropriate support needs Outcome: Progressing   Problem: Health Behavior/Discharge Planning: Goal: Ability to manage health-related needs will improve Outcome: Progressing   Problem: Self-Care: Goal: Ability to participate in self-care as condition permits will improve Outcome: Progressing Goal: Verbalization of feelings and concerns over difficulty with self-care will improve Outcome: Progressing Goal: Ability to communicate needs accurately will improve Outcome: Progressing   Problem: Nutrition: Goal: Risk of aspiration will decrease Outcome: Progressing Goal: Dietary intake will improve Outcome: Progressing   Problem: Ischemic Stroke/TIA Tissue Perfusion: Goal: Complications of ischemic stroke/TIA will be minimized Outcome: Progressing  Laporche Martelle, BSN, RN 

## 2019-10-22 NOTE — Progress Notes (Addendum)
[facility] Progress Note  Name: [name] MRN: [id number] DOB: [birth year]  Chief Complaint:  Acute left sided weakness  Subjective:  Feels a little stronger - getting easier to move her left leg.  She doesn't think she's building up flud. Had 325 mL UOP and one unquantified void over 11/28.  Review of systems:   Denies shortness of breath  No cp No n/v -----------------  Background on consult:  [name] is a 68 y.o. female with a history of end-stage renal disease, hypertension, diabetes mellitus type 2, GERD, and prior CVA who presented from [facility] with acute ischemic stroke.  She had presented there with left-sided weakness and difficulty ambulating which she noted at 4 AM on 1127 when she awoke to get ready for dialysis she was transferred for neurology eval.  Nephrology is consulted for assistance with management of hemodialysis she is normally patient at [facility] with prescription as below.  Note that she had been considering a transition to peritoneal dialysis per the dialysis unit; pt confirms.  They also indicate that her blood pressure drops easily with ultrafiltration.  They indicate that she has had multiple failed accesses and is dialyzing currently with a tunneled catheter; pt confirms no access in arms.  She left at her dry weight her last treatment on 11/25.  MRI without contrast with acute right basal ganglia infarcts and chronic ischemia with multiple old infarcts.  She was outside the window for tPA.  She states leg weakness getting better.  Wasn't able to walk this AM.  Has had back pain last several months.  Denies shortness of breath or n/v/d.  Spoke with her son as well who is at bedside    Intake/Output Summary (Last 24 hours) at 10/22/2019 0826 Last data filed at 10/22/2019 [id number] Gross per 24 hour  Intake 160 ml  Output 325 ml  Net -165 ml    Vitals:  Vitals:   10/22/19 0349 10/22/19 0438 10/22/19 0449 10/22/19 0719   BP:   (!) 184/93 (!) 164/88  Pulse:  (!) 115  (!) 117  Resp:  20  18  Temp:  98.7 F (37.1 C)  (!) 101.9 F (38.8 C)  TempSrc:  Oral  Oral  SpO2:  97%  100%  Weight: 66.3 kg     Height:         Physical Exam:  General: Adult female in bed in no acute distress at rest  HEENT: Normocephalic atraumatic Eyes: Extraocular movements intact sclera anicteric Neck: Supple trachea midline Heart:S1S2 no rub Lungs: Clear to auscultation bilaterally normal work of breathing at rest Abdomen: Soft nontender nondistended normal bowel sounds Extremities: No edema appreciated Neuro: alert and conversant; left leg subjectively weaker than right  psych calm mood and affect Access right IJ tunneled catheter  Medications reviewed   Labs:  BMP Latest Ref Rng & Units 10/21/2019 10/20/2019 09/12/2019  Glucose 70 - 99 mg/dL 131(H) 139(H) 89  BUN 8 - 23 mg/dL 62(H) 56(H) 20  Creatinine 0.44 - 1.00 mg/dL 8.86(H) 7.76(H) 5.44(H)  Sodium 135 - 145 mmol/L 140 139 134(L)  Potassium 3.5 - 5.1 mmol/L 4.9 4.4 4.0  Chloride 98 - 111 mmol/L 100 97(L) 90(L)  CO2 22 - 32 mmol/L 24 23 27   Calcium 8.9 - 10.3 mg/dL 8.4(L) 8.7(L) 8.9   Dialysis Orders:  [facility]  Schedule: MWF Time 3.5 hours  Tunneled Catheter, multiple failed access RUE and she is considering going to PD  Profile 1 - pressure  drops very easily;max is pull 1.1 an hour; do not pull rinseback revaclear 300 dialyzer BF 400  DF 600  Bath 2k/2.5 ca EDW 62.5 kg and last weight of 62.5 kg on 11/25 Anemia epogen 1200 units each tx; iron 50 mg once weekly (had this week already) Bone mineral 1.5 mcg hectoral each tx   Assessment/Plan:   # ESRD - Normally MWF schedule; last tx on 11/25.  Acute ischemic stroke 11/27 am  - HD deferred on 11/27 in the setting of acute stroke - Will assess dialysis needs daily - labs today pending but no urgent need on exam  - Plan for HD tx on 11/30, Monday per MWF schedule  # Acute stroke - Defer usual  treatment for HD today in the setting of acute stroke - neurology has consulted  # HTN  - BP management per primary in setting of acute stroke - Avoid hypotension with HD   # Anemia of CKD  - Defer ESA with acute CVA  # Secondary hyperparathyroidism  - continue hectorol   Claudia Desanctis, MD 10/22/2019 8:26 AM   Addendum:  Damaris Schooner with primary team.  Patient with fevers and with catheter in place for HD.  Blood cultures pending.  She also had an acute DVT noted RIJ vein.  Per Chest guidelines continue catheter for now and team is initiating anticoagulation with heparin without bolus.  Follow cultures to determine need for catheter removal.   Claudia Desanctis

## 2019-10-22 NOTE — Progress Notes (Signed)
Persistent fever for 24 hours; PRN Tylenol given. MD notified.

## 2019-10-22 NOTE — Progress Notes (Signed)
ANTICOAGULATION CONSULT NOTE  Pharmacy Consult for Heparin Indication: RIJ DVT  No Known Allergies  Patient Measurements: Height: 5\' 7"  (170.2 cm) Weight: 146 lb 2.6 oz (66.3 kg) IBW/kg (Calculated) : 61.6 Heparin Dosing Weight: 66 kg  Vital Signs: Temp: 99.4 F (37.4 C) (11/29 1913) Temp Source: Oral (11/29 1913) BP: 147/81 (11/29 1913) Pulse Rate: 96 (11/29 1913)  Labs: Recent Labs    10/20/19 0659 10/21/19 0405 10/22/19 0735 10/22/19 2228  HGB 9.7* 8.6* 9.6*  --   HCT 32.8* 27.4* 30.0*  --   PLT 348 274 274  --   LABPROT 13.2  --   --   --   INR 1.0  --   --   --   HEPARINUNFRC  --   --   --  0.33  CREATININE 7.76* 8.86* 10.27*  --     Estimated Creatinine Clearance: 5 mL/min (A) (by C-G formula based on SCr of 10.27 mg/dL (H)).  Assessment: 25 YOF with ESRD who presented on 11/27 with new CVA and is now found to have RIJ DVT and pharmacy consulted to start Heparin anticoagulation - low goal and no bolus with concurrent CVA.   Heparin level 0.33 (therapeutic) on gtt at 800 units/hr. No bleeding noted.  Goal of Therapy:  Heparin level 0.3-0.5 units/ml Monitor platelets by anticoagulation protocol: Yes   Plan:  Continue Heparin at 800 units/hr F/u a.m. heparin level  Thank you for allowing pharmacy to be a part of this patient's care.  Sherlon Handing, PharmD, BCPS Please see amion for complete clinical pharmacist phone list 10/22/2019 11:16 PM

## 2019-10-22 NOTE — Evaluation (Signed)
Physical Therapy Evaluation Patient Details Name: [name] MRN: [id number] DOB: 09-04-1951 Today's Date: 10/22/2019   History of Present Illness  [name] is a 68 y.o. female with history of ESRD ( on dialysis M-W-F), TIA 2010, HTN, HLD, Ha, DM2 who presented to  [facility] with c/o dizziness, left arm weakness and left leg numbness. MRI revealed acute R basal ganglia infarct. In addition, she has R IJ DVT and spiked a fever 11/27 with concern for sepsis.   Clinical Impression  Pt admitted with above diagnosis. PT eval limited today due to pt fever and concern for sepsis and pt mildly lethargic. Needed mod A to come to EOB from supine with decreased strength and control of LLE. Needed mod A to maintain sitting EOB due to L lean with occasional push of RUE. Pt fatigued very quickly in sitting. Attempted one stand and pt unable with max A +2. Scooted EOB with max A +2 and needed max A +2 to return to supine.  Pt currently with functional limitations due to the deficits listed below (see PT Problem List). Pt will benefit from skilled PT to increase their independence and safety with mobility to allow discharge to the venue listed below.       Follow Up Recommendations CIR    Equipment Recommendations  Other (comment)(TBD)    Recommendations for Other Services Rehab consult;OT consult     Precautions / Restrictions Precautions Precautions: Fall Restrictions Weight Bearing Restrictions: No      Mobility  Bed Mobility Overal bed mobility: Needs Assistance Bed Mobility: Supine to Sit     Supine to sit: Mod assist     General bed mobility comments: mod A for LLE off bed, needed vc's to roll and grap R rail with L hand, had some difficulty with grasping. Initiated sit but then experienced LBP and fell bkwds with control given from behind. Mod A to sit up from this position. Max A +2 for return to supine  Transfers Overall transfer level: Needs  assistance Equipment used: None Transfers: Lateral/Scoot Transfers;Sit to/from Stand Sit to Stand: +2 physical assistance;Max assist        Lateral/Scoot Transfers: Max assist;+2 physical assistance General transfer comment: pt attempted to stand EOB but was unable with max A +2 due to weakness, LLE>RLE. Scooted to R to [facility] and was able to push with feet but still needed max A +2 to actually move hips  Ambulation/Gait             General Gait Details: unable  Stairs            Wheelchair Mobility    Modified Rankin (Stroke Patients Only) Modified Rankin (Stroke Patients Only) Pre-Morbid Rankin Score: No symptoms Modified Rankin: Severe disability     Balance Overall balance assessment: Needs assistance Sitting-balance support: Bilateral upper extremity supported Sitting balance-Leahy Scale: Poor Sitting balance - Comments: pt maintained initial sitting but fatigued very quickly and started pushing L with RUE. RUE placed in pt's lap but pt still needing mod support due to anterior and L lean Postural control: Left lateral lean Standing balance support: Bilateral upper extremity supported Standing balance-Leahy Scale: Zero                               Pertinent Vitals/Pain Pain Assessment: Faces Faces Pain Scale: Hurts even more Pain Location: low back in sitting Pain Descriptors / Indicators: Aching Pain Intervention(s): Limited activity  within patient's tolerance;Monitored during session;Repositioned    Home Living Family/patient expects to be discharged to:: Private residence Living Arrangements: Other relatives Available Help at Discharge: Family Type of Home: House Home Access: Stairs to enter Entrance Stairs-Rails: Right;Left;Can reach both Technical brewer of Steps: [place]: One level [place]: [place] - single point Additional Comments: pt states that she lives with her brother, chart states that she lives with her  husband?    Prior Function Level of Independence: Independent         Comments: household and short distanced community ambulator, drives     Hand Dominance   Dominant Hand: Right    Extremity/Trunk Assessment   Upper Extremity Assessment Upper Extremity Assessment: Defer to OT evaluation    Lower Extremity Assessment Lower Extremity Assessment: LLE deficits/detail LLE Deficits / Details: hip flex 2/5, knee ext 2-/5, L knee buckling with attempted stand LLE Sensation: (unable to test today) LLE Coordination: decreased gross motor    Cervical / Trunk Assessment Cervical / Trunk Assessment: Normal  Communication   Communication: No difficulties  Cognition Arousal/Alertness: Lethargic Behavior During Therapy: WFL for tasks assessed/performed Overall Cognitive Status: No family/caregiver present to determine baseline cognitive functioning                                 General Comments: lethargic and question accuracy of her history      General Comments General comments (skin integrity, edema, etc.): limited eval due to concern for sepsis    Exercises     Assessment/Plan    PT Assessment Patient needs continued PT services  PT Problem List Decreased strength;Decreased activity tolerance;Decreased balance;Decreased mobility;Decreased coordination;Decreased cognition;Decreased knowledge of precautions;Decreased safety awareness;Pain;Impaired sensation;Impaired tone;Decreased range of motion;Decreased knowledge of use of DME       PT Treatment Interventions DME instruction;Gait training;Stair training;Functional mobility training;Therapeutic activities;Therapeutic exercise;Balance training;Neuromuscular re-education;Cognitive remediation;Patient/family education    PT Goals (Current goals can be found in the Care Plan section)  Acute Rehab PT Goals Patient Stated Goal: get out of this bed PT Goal Formulation: With patient Time For Goal Achievement:  11/05/19 Potential to Achieve Goals: Good    Frequency Min 4X/week   Barriers to discharge        Co-evaluation               AM-PAC PT "6 Clicks" Mobility  Outcome Measure Help needed turning from your back to your side while in a flat bed without using bedrails?: A Lot Help needed moving from lying on your back to sitting on the side of a flat bed without using bedrails?: A Lot Help needed moving to and from a bed to a chair (including a wheelchair)?: Total Help needed standing up from a chair using your arms (e.g., wheelchair or bedside chair)?: Total Help needed to walk in hospital room?: Total Help needed climbing 3-5 steps with a railing? : Total 6 Click Score: 8    End of Session   Activity Tolerance: Patient limited by lethargy;Patient limited by fatigue Patient left: in bed;with call bell/phone within reach;with bed alarm set Nurse Communication: Mobility status PT Visit Diagnosis: Unsteadiness on feet (R26.81);Muscle weakness (generalized) (M62.81);Difficulty in walking, not elsewhere classified (R26.2);Pain Pain - part of body: (low back)    Time: 1019-1030 PT Time Calculation (min) (ACUTE ONLY): 11 min   Charges:   PT Evaluation $PT Eval Moderate Complexity: 1 Mod  [name], [place]  Pager [phone] Office [place] 10/22/2019, 11:33 AM

## 2019-10-22 NOTE — Progress Notes (Signed)
TCD bubble study completed. Refer to "CV Proc" under chart review to view preliminary results.  10/22/2019 11:58 AM Kelby Aline., MHA, RVT, RDCS, RDMS

## 2019-10-22 NOTE — Plan of Care (Signed)
  Problem: Education: Goal: Knowledge of General Education information will improve Description Including pain rating scale, medication(s)/side effects and non-pharmacologic comfort measures Outcome: Progressing   Problem: Health Behavior/Discharge Planning: Goal: Ability to manage health-related needs will improve Outcome: Progressing   Problem: Clinical Measurements: Goal: Ability to maintain clinical measurements within normal limits will improve Outcome: Progressing Goal: Will remain free from infection Outcome: Progressing Goal: Diagnostic test results will improve Outcome: Progressing Goal: Respiratory complications will improve Outcome: Progressing Goal: Cardiovascular complication will be avoided Outcome: Progressing   Problem: Activity: Goal: Risk for activity intolerance will decrease Outcome: Progressing   Problem: Nutrition: Goal: Adequate nutrition will be maintained Outcome: Progressing   Problem: Coping: Goal: Level of anxiety will decrease Outcome: Progressing   Problem: Elimination: Goal: Will not experience complications related to bowel motility Outcome: Progressing Goal: Will not experience complications related to urinary retention Outcome: Progressing   Problem: Pain Managment: Goal: General experience of comfort will improve Outcome: Progressing   Problem: Safety: Goal: Ability to remain free from injury will improve Outcome: Progressing   Problem: Skin Integrity: Goal: Risk for impaired skin integrity will decrease Outcome: Progressing   Problem: Education: Goal: Knowledge of disease or condition will improve Outcome: Progressing Goal: Knowledge of secondary prevention will improve Outcome: Progressing Goal: Knowledge of patient specific risk factors addressed and post discharge goals established will improve Outcome: Progressing Goal: Individualized Educational Video(s) Outcome: Progressing   Problem: Coping: Goal: Will verbalize  positive feelings about self Outcome: Progressing Goal: Will identify appropriate support needs Outcome: Progressing   Problem: Health Behavior/Discharge Planning: Goal: Ability to manage health-related needs will improve Outcome: Progressing   Problem: Self-Care: Goal: Ability to participate in self-care as condition permits will improve Outcome: Progressing Goal: Verbalization of feelings and concerns over difficulty with self-care will improve Outcome: Progressing Goal: Ability to communicate needs accurately will improve Outcome: Progressing   Problem: Nutrition: Goal: Risk of aspiration will decrease Outcome: Progressing Goal: Dietary intake will improve Outcome: Progressing   Problem: Ischemic Stroke/TIA Tissue Perfusion: Goal: Complications of ischemic stroke/TIA will be minimized Outcome: Progressing  Ilze Roselli, BSN, RN 

## 2019-10-22 NOTE — Progress Notes (Signed)
ANTICOAGULATION CONSULT NOTE - Initial Consult  Pharmacy Consult for Heparin Indication: RIJ DVT  No Known Allergies  Patient Measurements: Height: 5\' 7"  (170.2 cm) Weight: 146 lb 2.6 oz (66.3 kg) IBW/kg (Calculated) : 61.6 Heparin Dosing Weight: 66 kg  Vital Signs: Temp: 99.4 F (37.4 C) (11/29 1137) Temp Source: Oral (11/29 1137) BP: 156/79 (11/29 1137) Pulse Rate: 88 (11/29 1137)  Labs: Recent Labs    10/20/19 0659 10/21/19 0405 10/22/19 0735  HGB 9.7* 8.6* 9.6*  HCT 32.8* 27.4* 30.0*  PLT 348 274 274  LABPROT 13.2  --   --   INR 1.0  --   --   CREATININE 7.76* 8.86* 10.27*    Estimated Creatinine Clearance: 5 mL/min (A) (by C-G formula based on SCr of 10.27 mg/dL (H)).   Medical History: Past Medical History:  Diagnosis Date  . Arthritis    hands  . Constipation   . Diabetes mellitus without complication (HCC)    Type II  . ESRD (end stage renal disease) ([place])     M/W/F- Hemodialysis  . GERD (gastroesophageal reflux disease)   . Headache   . Hyperlipidemia   . Hypertension   . Irregular heart rate   . Stroke [facility])    TIA - approx 2010- no residual    Assessment: 47 YOF with ESRD who presented on 11/27 with new CVA and is now found to have RIJ DVT and pharmacy consulted to start Heparin anticoagulation - low goal and no bolus with concurrent CVA.   The patient received a dose of Hep SQ for VTE prophylaxis around 0530 this AM. The patient was on ASA and plavix for secondary CVA prophylaxis however this has now been held.   Goal of Therapy:  Heparin level 0.3-0.5 units/ml Monitor platelets by anticoagulation protocol: Yes   Plan:  - Start Heparin at 800 units/hr - Will continue to monitor for any signs/symptoms of bleeding and will follow up with heparin level in 8 hours   Thank you for allowing pharmacy to be a part of this patient's care.  Alycia Rossetti, PharmD, BCPS Clinical Pharmacist Clinical phone for 10/22/2019: [phone] 10/22/2019  12:17 PM   **Pharmacist phone directory can now be found on [email or web address] ([email or web address]).  Listed under [place].

## 2019-10-23 ENCOUNTER — Inpatient Hospital Stay (HOSPITAL_COMMUNITY): Payer: Medicare PPO

## 2019-10-23 DIAGNOSIS — R531 Weakness: Secondary | ICD-10-CM

## 2019-10-23 DIAGNOSIS — I82C11 Acute embolism and thrombosis of right internal jugular vein: Secondary | ICD-10-CM | POA: Diagnosis not present

## 2019-10-23 DIAGNOSIS — Z789 Other specified health status: Secondary | ICD-10-CM

## 2019-10-23 DIAGNOSIS — I639 Cerebral infarction, unspecified: Principal | ICD-10-CM

## 2019-10-23 DIAGNOSIS — G8194 Hemiplegia, unspecified affecting left nondominant side: Secondary | ICD-10-CM

## 2019-10-23 DIAGNOSIS — R7881 Bacteremia: Secondary | ICD-10-CM | POA: Diagnosis not present

## 2019-10-23 DIAGNOSIS — I120 Hypertensive chronic kidney disease with stage 5 chronic kidney disease or end stage renal disease: Secondary | ICD-10-CM

## 2019-10-23 DIAGNOSIS — E1122 Type 2 diabetes mellitus with diabetic chronic kidney disease: Secondary | ICD-10-CM

## 2019-10-23 DIAGNOSIS — B965 Pseudomonas (aeruginosa) (mallei) (pseudomallei) as the cause of diseases classified elsewhere: Secondary | ICD-10-CM

## 2019-10-23 DIAGNOSIS — Z992 Dependence on renal dialysis: Secondary | ICD-10-CM | POA: Diagnosis not present

## 2019-10-23 DIAGNOSIS — E785 Hyperlipidemia, unspecified: Secondary | ICD-10-CM

## 2019-10-23 DIAGNOSIS — Z8619 Personal history of other infectious and parasitic diseases: Secondary | ICD-10-CM

## 2019-10-23 DIAGNOSIS — I6322 Cerebral infarction due to unspecified occlusion or stenosis of basilar arteries: Secondary | ICD-10-CM

## 2019-10-23 DIAGNOSIS — N186 End stage renal disease: Secondary | ICD-10-CM | POA: Diagnosis not present

## 2019-10-23 DIAGNOSIS — Z7409 Other reduced mobility: Secondary | ICD-10-CM | POA: Diagnosis not present

## 2019-10-23 DIAGNOSIS — Z8739 Personal history of other diseases of the musculoskeletal system and connective tissue: Secondary | ICD-10-CM

## 2019-10-23 HISTORY — PX: IR REMOVAL TUN CV CATH W/O FL: IMG2289

## 2019-10-23 LAB — BLOOD CULTURE ID PANEL (REFLEXED)

## 2019-10-23 LAB — RENAL FUNCTION PANEL
Albumin: 1.9 g/dL — ABNORMAL LOW (ref 3.5–5.0)
Anion gap: 16 — ABNORMAL HIGH (ref 5–15)
BUN: 87 mg/dL — ABNORMAL HIGH (ref 8–23)
CO2: 22 mmol/L (ref 22–32)
Calcium: 8.1 mg/dL — ABNORMAL LOW (ref 8.9–10.3)
Chloride: 98 mmol/L (ref 98–111)
Creatinine, Ser: 12.23 mg/dL — ABNORMAL HIGH (ref 0.44–1.00)
GFR calc Af Amer: 3 mL/min — ABNORMAL LOW (ref >60)
GFR calc non Af Amer: 3 mL/min — ABNORMAL LOW (ref >60)
Glucose, Bld: 174 mg/dL — ABNORMAL HIGH (ref 70–99)
Phosphorus: 4.9 mg/dL — ABNORMAL HIGH (ref 2.5–4.6)
Potassium: 4.8 mmol/L (ref 3.5–5.1)
Sodium: 136 mmol/L (ref 135–145)

## 2019-10-23 LAB — CBC
HCT: 25.5 % — ABNORMAL LOW (ref 36.0–46.0)
Hemoglobin: 8.1 g/dL — ABNORMAL LOW (ref 12.0–15.0)
MCH: 27.9 pg (ref 26.0–34.0)
MCHC: 31.8 g/dL (ref 30.0–36.0)
MCV: 87.9 fL (ref 80.0–100.0)
Platelets: 210 10*3/uL (ref 150–400)
RBC: 2.9 MIL/uL — ABNORMAL LOW (ref 3.87–5.11)
RDW: 17.5 % — ABNORMAL HIGH (ref 11.5–15.5)
WBC: 11.1 10*3/uL — ABNORMAL HIGH (ref 4.0–10.5)
nRBC: 0 % (ref 0.0–0.2)

## 2019-10-23 LAB — HEPARIN LEVEL (UNFRACTIONATED)
Heparin Unfractionated: 0.24 IU/mL — ABNORMAL LOW (ref 0.30–0.70)
Heparin Unfractionated: <0.1 IU/mL — ABNORMAL LOW (ref 0.30–0.70)

## 2019-10-23 LAB — GLUCOSE, CAPILLARY
Glucose-Capillary: 178 mg/dL — ABNORMAL HIGH (ref 70–99)
Glucose-Capillary: 119 mg/dL — ABNORMAL HIGH (ref 70–99)
Glucose-Capillary: 158 mg/dL — ABNORMAL HIGH (ref 70–99)

## 2019-10-23 LAB — HEPATITIS B SURFACE ANTIGEN: Hepatitis B Surface Ag: NONREACTIVE

## 2019-10-23 LAB — HEPATITIS B CORE ANTIBODY, TOTAL: Hep B Core Total Ab: NONREACTIVE

## 2019-10-23 MED ORDER — CHLORHEXIDINE GLUCONATE 4 % EX LIQD
CUTANEOUS | Status: AC
  Filled 2019-10-23: qty 15

## 2019-10-23 MED ORDER — CARVEDILOL 6.25 MG PO TABS
6.2500 mg | ORAL_TABLET | Freq: Two times a day (BID) | ORAL | Status: DC
  Administered 2019-10-23 – 2019-10-31 (×17): 6.25 mg via ORAL
  Filled 2019-10-23 (×17): qty 1

## 2019-10-23 MED ORDER — HEPARIN SODIUM (PORCINE) 1000 UNIT/ML IJ SOLN
3800.0000 [IU] | Freq: Once | INTRAMUSCULAR | Status: AC
  Administered 2019-10-23: 13:00:00 3800 [IU]

## 2019-10-23 MED ORDER — SODIUM CHLORIDE 0.9 % IV SOLN
2.0000 g | INTRAVENOUS | Status: AC
  Administered 2019-10-23: 2 g via INTRAVENOUS
  Filled 2019-10-23: qty 2

## 2019-10-23 MED ORDER — HEPARIN SODIUM (PORCINE) 1000 UNIT/ML IJ SOLN
INTRAMUSCULAR | Status: AC
  Administered 2019-10-23: 3800 [IU]
  Filled 2019-10-23: qty 4

## 2019-10-23 MED ORDER — SODIUM CHLORIDE 0.9 % IV SOLN
2.0000 g | Freq: Once | INTRAVENOUS | Status: DC
  Filled 2019-10-23: qty 2

## 2019-10-23 MED ORDER — DOXERCALCIFEROL 4 MCG/2ML IV SOLN
INTRAVENOUS | Status: AC
  Filled 2019-10-23: qty 2

## 2019-10-23 MED ORDER — SODIUM CHLORIDE 0.9 % IV SOLN
1.0000 g | INTRAVENOUS | Status: DC
  Administered 2019-10-24 – 2019-10-31 (×8): 1 g via INTRAVENOUS
  Filled 2019-10-23 (×9): qty 1

## 2019-10-23 NOTE — Progress Notes (Signed)
PHARMACY - PHYSICIAN COMMUNICATION CRITICAL VALUE ALERT - BLOOD CULTURE IDENTIFICATION (BCID)  [name] is an 68 y.o. female who presented to [facility] on 10/20/2019 with stroke.  Assessment:  1 bottle from 4 sets returns growing Pseudomonas.  Name of physician (or Provider) Contacted: PJoseph MD  Current antibiotics: N/A  Changes to prescribed antibiotics recommended:  Will start cefepime 2g now and after each HD.  Results for orders placed or performed during the hospital encounter of 10/20/19  Blood Culture ID Panel (Reflexed) (Collected: 10/22/2019  7:43 AM)  Result Value Ref Range   Enterococcus species NOT DETECTED NOT DETECTED   Listeria monocytogenes NOT DETECTED NOT DETECTED   Staphylococcus species NOT DETECTED NOT DETECTED   Staphylococcus aureus (BCID) NOT DETECTED NOT DETECTED   Streptococcus species NOT DETECTED NOT DETECTED   Streptococcus agalactiae NOT DETECTED NOT DETECTED   Streptococcus pneumoniae NOT DETECTED NOT DETECTED   Streptococcus pyogenes NOT DETECTED NOT DETECTED   Acinetobacter baumannii NOT DETECTED NOT DETECTED   Enterobacteriaceae species NOT DETECTED NOT DETECTED   Enterobacter cloacae complex NOT DETECTED NOT DETECTED   Escherichia coli NOT DETECTED NOT DETECTED   Klebsiella oxytoca NOT DETECTED NOT DETECTED   Klebsiella pneumoniae NOT DETECTED NOT DETECTED   Proteus species NOT DETECTED NOT DETECTED   Serratia marcescens NOT DETECTED NOT DETECTED   Carbapenem resistance NOT DETECTED NOT DETECTED   Haemophilus influenzae NOT DETECTED NOT DETECTED   Neisseria meningitidis NOT DETECTED NOT DETECTED   Pseudomonas aeruginosa DETECTED (A) NOT DETECTED   Candida albicans NOT DETECTED NOT DETECTED   Candida glabrata NOT DETECTED NOT DETECTED   Candida krusei NOT DETECTED NOT DETECTED   Candida parapsilosis NOT DETECTED NOT DETECTED   Candida tropicalis NOT DETECTED NOT DETECTED    Wynona Neat, PharmD, BCPS  10/23/2019  7:41 AM

## 2019-10-23 NOTE — Progress Notes (Addendum)
PROGRESS NOTE    [name]  [id number] DOB: 1951/11/06 DOA: 10/20/2019 PCP: Manon Hilding, MD  Brief Narrative: [name] is a 68 y.o. female with medical history of ESRD (MWF), diabetes mellitus type 2, stroke, hypertension, hyperlipidemia presenting with left-sided weakness.  The patient was her usual self around 9 PM when she went to bed on the evening of 10/19/2019.  However when she woke up to get ready for dialysis at 4 AM on 10/20/2019, the patient noted left-sided weakness and had difficulty ambulating -MRI noted right basal ganglia infarcts -Hospitalization complicated by fevers of 101, and finding of right IJ DVT on Dopplers -now with recurrent Pseudomonas bacteremia   Assessment & Plan:    Acute ischemic stroke -MRI notes right basal ganglia infarcts, continues to have significant left-sided deficits -2D echocardiogram with preserved EF, no cardiac source of embolus -Carotid duplex is unremarkable, no PFO noted on TCD bubble study -Patient was on aspirin and Plavix at baseline which was continued however given concomitant new finding of acute DVT in her right IJ she was started on IV heparin without bolus, discussed with neurology Dr. Erlinda Hong as well as nephrology Dr. Royce Macadamia -Now given bacteremia, endocarditis may be a consideration as well -Aspirin and Plavix discontinued 11/29 -Hemoglobin A1c is 7.6, LDL is 65 -Neurology consulting -PT OT, SLP evaluation completed, CIR recommended -CIR consult  Recurrent Pseudomonas bacteremia -Likely secondary to HD catheter, previously treated with 6-week course of IV Tressie Ellis which was completed 10/15, previous HD catheter was removed and she had a new right IJ tunneled catheter placed then -Discussed with renal regarding HD catheter removal, they will try to do this after HD today -2D echocardiogram is unremarkable, may need TEE -Will request infectious disease input, discussed with Dr. Baxter Flattery -Start IV cefepime -Back in  the end of August when she had bacteremia and there was concern for possible discitis of L3-L4, this was later felt to be renal spondyloarthropathy on repeat imaging  Right IJ acute DVT -This is secondary to her HD catheter, discussed with renal, given concurrent bacteremia will need HD catheter removal -Treat with anticoagulation for 3 months after catheter removal, given ESRD options are limited,continue IV heparin without bolus and aim for a lower PTT, hold off on Coumadin until new dialysis access issues sorted out  ESRD -Nephrology consulted, usually dialyzes Monday Wednesday Friday -She has a right IJ HD catheter, see discussion above  Diabetes mellitus type 2 -Hemoglobin A1c is 7.6 -NovoLog sliding scale  Essential hypertension -Restart Coreg, amlodipine on hold -hydralazine prn SBP >220  Anemia of chronic disease -Per renal  DVT prophylaxis: Heparin IV Code Status: Full code Family Communication: No family at bedside called and left message for son Disposition Plan: Possibly CIR when above work-up completed  Consultants:   Neurology, renal  -Infectious disease   Procedures:   Antimicrobials:    Subjective: -Feels weak all over, no changes in her neurological deficits  Objective: Vitals:   10/23/19 1030 10/23/19 1100 10/23/19 1130 10/23/19 1200  BP: (!) 172/94 (!) 170/77 (!) 162/85 (!) 162/87  Pulse: 92 98 92 79  Resp:      Temp:      TempSrc:      SpO2:      Weight:      Height:        Intake/Output Summary (Last 24 hours) at 10/23/2019 1358 Last data filed at 10/23/2019 0446 Gross per 24 hour  Intake 100 ml  Output -  Net 100 ml  Filed Weights   10/22/19 0349 10/23/19 0447 10/23/19 0850  Weight: 66.3 kg 63.3 kg 63.3 kg    Examination:  Gen: Frail elderly female, chronically ill-appearing, AAO x3, no distress HEENT: PERRLA, Neck supple, no JVD Lungs: Poor air movement bilaterally, left IJ HD catheter CVS: S1-S2, regular rate and  rhythm Abd: Soft nontender, nondistended, bowel sounds present Extremities: No Cyanosis, Clubbing or edema Skin: no new rashes Central nervous system: Awake and alert, oriented x3, left upper and lower extremity is 3/5  Psychiatry: Insight and judgment appear normal    Data Reviewed:   CBC: Recent Labs  Lab 10/20/19 0659 10/21/19 0405 10/22/19 0735 10/23/19 0640  WBC 15.7* 10.9* 13.3* 11.1*  NEUTROABS 12.4*  --   --   --   HGB 9.7* 8.6* 9.6* 8.1*  HCT 32.8* 27.4* 30.0* 25.5*  MCV 91.6 88.4 88.2 87.9  PLT 348 274 274 [id number]   Basic Metabolic Panel: Recent Labs  Lab 10/20/19 0659 10/21/19 0405 10/22/19 0735 10/23/19 0640  NA 139 140 138 136  K 4.4 4.9 4.8 4.8  CL 97* 100 98 98  CO2 23 24 22 22   GLUCOSE 139* 131* 148* 174*  BUN 56* 62* 74* 87*  CREATININE 7.76* 8.86* 10.27* 12.23*  CALCIUM 8.7* 8.4* 8.5* 8.1*  PHOS  --  4.4 4.3 4.9*   GFR: Estimated Creatinine Clearance: 4.2 mL/min (A) (by C-G formula based on SCr of 12.23 mg/dL (H)). Liver Function Tests: Recent Labs  Lab 10/20/19 0659 10/21/19 0405 10/22/19 0735 10/23/19 0640  AST 12*  --   --   --   ALT 16  --   --   --   ALKPHOS 75  --   --   --   BILITOT 0.7  --   --   --   PROT 7.9  --   --   --   ALBUMIN 2.8* 2.1* 2.3* 1.9*   No results for input(s): LIPASE, AMYLASE in the last 168 hours. No results for input(s): AMMONIA in the last 168 hours. Coagulation Profile: Recent Labs  Lab 10/20/19 0659  INR 1.0   Cardiac Enzymes: No results for input(s): CKTOTAL, CKMB, CKMBINDEX, TROPONINI in the last 168 hours. BNP (last 3 results) No results for input(s): PROBNP in the last 8760 hours. HbA1C: Recent Labs    10/21/19 0405  HGBA1C 7.6*   CBG: Recent Labs  Lab 10/22/19 0601 10/22/19 1140 10/22/19 1748 10/22/19 2146 10/23/19 0626  GLUCAP 143* 81 209* 198* 178*   Lipid Profile: Recent Labs    10/21/19 0405  CHOL 150  HDL 47  LDLCALC 65  TRIG 192*  CHOLHDL 3.2   Thyroid Function  Tests: No results for input(s): TSH, T4TOTAL, FREET4, T3FREE, THYROIDAB in the last 72 hours. Anemia Panel: No results for input(s): VITAMINB12, FOLATE, FERRITIN, TIBC, IRON, RETICCTPCT in the last 72 hours. Urine analysis:    Component Value Date/Time   COLORURINE YELLOW 10/20/2019 0954   APPEARANCEUR HAZY (A) 10/20/2019 0954   LABSPEC 1.010 10/20/2019 0954   PHURINE 7.0 10/20/2019 0954   GLUCOSEU 50 (A) 10/20/2019 0954   HGBUR NEGATIVE 10/20/2019 0954   BILIRUBINUR NEGATIVE 10/20/2019 0954   KETONESUR NEGATIVE 10/20/2019 0954   PROTEINUR 100 (A) 10/20/2019 0954   NITRITE NEGATIVE 10/20/2019 0954   LEUKOCYTESUR NEGATIVE 10/20/2019 0954   Sepsis Labs: @LABRCNTIP (procalcitonin:4,lacticidven:4)  ) Recent Results (from the past 240 hour(s))  SARS CORONAVIRUS 2 (TAT 6-24 HRS) Nasopharyngeal Nasopharyngeal Swab     Status: None  Collection Time: 10/20/19  7:30 AM   Specimen: Nasopharyngeal Swab  Result Value Ref Range Status   SARS Coronavirus 2 NEGATIVE NEGATIVE Final    Comment: (NOTE) SARS-CoV-2 target nucleic acids are NOT DETECTED. The SARS-CoV-2 RNA is generally detectable in upper and lower respiratory specimens during the acute phase of infection. Negative results do not preclude SARS-CoV-2 infection, do not rule out co-infections with other pathogens, and should not be used as the sole basis for treatment or other patient management decisions. Negative results must be combined with clinical observations, patient history, and epidemiological information. The expected result is Negative. Fact Sheet for Patients: SugarRoll.be Fact Sheet for Healthcare Providers: https://www.woods-mathews.com/ This test is not yet approved or cleared by the [place] FDA and  has been authorized for detection and/or diagnosis of SARS-CoV-2 by FDA under an Emergency Use Authorization (EUA). This EUA will remain  in effect (meaning this test can  be used) for the duration of the COVID-19 declaration under Section 56 4(b)(1) of the Act, 21 U.S.C. section 360bbb-3(b)(1), unless the authorization is terminated or revoked sooner. Performed at [facility], [place] [address] [place], [place] [address]   Culture, blood (routine x 2)     Status: None (Preliminary result)   Collection Time: 10/22/19  7:35 AM   Specimen: BLOOD  Result Value Ref Range Status   Specimen Description BLOOD RIGHT ANTECUBITAL  Final   Special Requests   Final    BOTTLES DRAWN AEROBIC ONLY Blood Culture adequate volume   Culture  Setup Time   Final    GRAM NEGATIVE RODS AEROBIC BOTTLE ONLY CRITICAL VALUE NOTED.  VALUE IS CONSISTENT WITH PREVIOUSLY REPORTED AND CALLED VALUE. Performed at [facility], [place] [address] [place], [place] [address]    Culture GRAM NEGATIVE RODS  Final   Report Status PENDING  Incomplete  Culture, blood (routine x 2)     Status: None (Preliminary result)   Collection Time: 10/22/19  7:43 AM   Specimen: BLOOD LEFT HAND  Result Value Ref Range Status   Specimen Description BLOOD LEFT HAND  Final   Special Requests   Final    BOTTLES DRAWN AEROBIC ONLY Blood Culture adequate volume   Culture  Setup Time   Final    GRAM NEGATIVE RODS AEROBIC BOTTLE ONLY CRITICAL RESULT CALLED TO, READ BACK BY AND VERIFIED WITH: PHARMD V [place] [id number] AT 48 AM BY CM Performed at [facility], [place] [address], [place], [place] [address]    Culture GRAM NEGATIVE RODS  Final   Report Status PENDING  Incomplete  Blood Culture ID Panel (Reflexed)     Status: Abnormal   Collection Time: 10/22/19  7:43 AM  Result Value Ref Range Status   Enterococcus species NOT DETECTED NOT DETECTED Final   Listeria monocytogenes NOT DETECTED NOT DETECTED Final   Staphylococcus species NOT DETECTED NOT DETECTED Final   Staphylococcus aureus (BCID) NOT DETECTED NOT DETECTED Final   Streptococcus species NOT DETECTED NOT DETECTED Final   Streptococcus  agalactiae NOT DETECTED NOT DETECTED Final   Streptococcus pneumoniae NOT DETECTED NOT DETECTED Final   Streptococcus pyogenes NOT DETECTED NOT DETECTED Final   Acinetobacter baumannii NOT DETECTED NOT DETECTED Final   Enterobacteriaceae species NOT DETECTED NOT DETECTED Final   Enterobacter cloacae complex NOT DETECTED NOT DETECTED Final   Escherichia coli NOT DETECTED NOT DETECTED Final   Klebsiella oxytoca NOT DETECTED NOT DETECTED Final   Klebsiella pneumoniae NOT DETECTED NOT  DETECTED Final   Proteus species NOT DETECTED NOT DETECTED Final   Serratia marcescens NOT DETECTED NOT DETECTED Final   Carbapenem resistance NOT DETECTED NOT DETECTED Final   Haemophilus influenzae NOT DETECTED NOT DETECTED Final   Neisseria meningitidis NOT DETECTED NOT DETECTED Final   Pseudomonas aeruginosa DETECTED (A) NOT DETECTED Final    Comment: CRITICAL RESULT CALLED TO, READ BACK BY AND VERIFIED WITH: PHARMD V BRYK [id number] AT 725 AM BY CM    Candida albicans NOT DETECTED NOT DETECTED Final   Candida glabrata NOT DETECTED NOT DETECTED Final   Candida krusei NOT DETECTED NOT DETECTED Final   Candida parapsilosis NOT DETECTED NOT DETECTED Final   Candida tropicalis NOT DETECTED NOT DETECTED Final    Comment: Performed at [facility], [place] [address] [place], [place] [address]  Culture, blood (routine x 2)     Status: None (Preliminary result)   Collection Time: 10/22/19  5:03 PM   Specimen: BLOOD LEFT HAND  Result Value Ref Range Status   Specimen Description BLOOD LEFT HAND  Final   Special Requests   Final    BOTTLES DRAWN AEROBIC AND ANAEROBIC Blood Culture adequate volume   Culture   Final    NO GROWTH < 24 HOURS Performed at [facility], [place] [address], [place], [place] [address]    Report Status PENDING  Incomplete  Culture, blood (routine x 2)     Status: None (Preliminary result)   Collection Time: 10/22/19  5:08 PM   Specimen: BLOOD LEFT ARM  Result Value Ref Range  Status   Specimen Description BLOOD LEFT ARM  Final   Special Requests   Final    BOTTLES DRAWN AEROBIC ONLY Blood Culture results may not be optimal due to an inadequate volume of blood received in culture bottles   Culture   Final    NO GROWTH < 24 HOURS Performed at [facility], [place] [address], [place], [place] [address]    Report Status PENDING  Incomplete         Radiology Studies: Vas [name] Transcranial Doppler W Bubbles  Result Date: 10/22/2019  Transcranial Doppler with Bubble Indications: Stroke. Comparison Study: No prior study. Performing Technologist: Maudry Mayhew MHA, RDMS, RVT, RDCS  Examination Guidelines: A complete evaluation includes B-mode imaging, spectral Doppler, color Doppler, and power Doppler as needed of all accessible portions of each vessel. Bilateral testing is considered an integral part of a complete examination. Limited examinations for reoccurring indications may be performed as noted.  Summary:  A vascular evaluation was performed. The right ICA siphon was studied. An IV was inserted into the patient's right forearm. Verbal informed consent was obtained.  No obvious evidence of high intensity transient signals (HITS), therefore no evidence of clinically significant patent foramen ovale (PFO). *See table(s) above for measurements and observations.    Preliminary         Scheduled Meds: . acetaminophen  1,000 mg Oral Once  . calcium carbonate  1 tablet Oral 2 times per day  . carvedilol  6.25 mg Oral BID  . Chlorhexidine Gluconate Cloth  6 each Topical Daily  . Chlorhexidine Gluconate Cloth  6 each Topical Q0600  . doxercalciferol  1.5 mcg Intravenous Q M,W,F-HD  . gabapentin  100 mg Oral QHS  . insulin aspart  0-5 Units Subcutaneous QHS  . insulin aspart  0-9 Units Subcutaneous TID WC  . isosorbide mononitrate  30 mg Oral QPC lunch  .  multivitamin  1 tablet Oral QPC lunch   Continuous Infusions: . sodium chloride Stopped (10/20/19  1317)  . [START ON 10/24/2019] ceFEPime (MAXIPIME) IV    . heparin 900 Units/hr (10/23/19 1054)     LOS: 3 days    Time spent: 43min    Domenic Polite, MD [facility]  10/23/2019, 1:58 PM

## 2019-10-23 NOTE — Consult Note (Signed)
Physical Medicine and Rehabilitation Consult Reason for Consult: Left side weakness Referring Physician: [facility]   HPI: [name] is a 68 y.o. right-handed female with history of end-stage renal disease with hemodialysis Monday Wednesday Fridays, diabetes mellitus, TIA 2010 maintained on aspirin and Plavix, hypertension, hyperlipidemia.  Per chart review patient lives with her brother.  Independent prior to admission.  Presented 10/20/2019 with left-sided weakness and difficulty with ambulation.  Her last dialysis was 10/18/2019.  CT of the brain showed acute right basal ganglia infarction.  Patient did not receive TPA.  Admission chemistries with creatinine 7.76, BUN 56, WBC 15,700, hemoglobin 9.7, SARS coronavirus negative.  MRI/MRA showed acute right basal ganglia infarction chronic ischemia with multiple old infarcts.  Negative head MRA.  Carotid Dopplers unremarkable no PFO.  Echocardiogram with ejection fraction of 65% without emboli.  Hospital course complicated by low-grade fever of 101 findings of right IJ DVT on Dopplers.  Patient was placed on IV heparin and follow-up per neurology services.  Tolerating a regular consistency diet.  Hemodialysis ongoing as per renal services.  Therapy evaluations completed with recommendations of physical medicine rehab consult.   Review of Systems  Constitutional: Positive for fever. Negative for chills.  HENT: Negative for hearing loss.   Eyes: Negative for blurred vision and double vision.  Respiratory: Negative for cough and shortness of breath.   Cardiovascular: Positive for palpitations and leg swelling.  Gastrointestinal: Positive for constipation. Negative for heartburn, nausea and vomiting.       GERD  Genitourinary: Negative for dysuria, flank pain and hematuria.  Musculoskeletal: Positive for joint pain and myalgias.  Neurological: Positive for focal weakness and headaches.  All other systems reviewed and are negative.  Past  Medical History:  Diagnosis Date   Arthritis    hands   Constipation    Diabetes mellitus without complication (HCC)    Type II   ESRD (end stage renal disease) ([place])     M/W/F- Hemodialysis   GERD (gastroesophageal reflux disease)    Headache    Hyperlipidemia    Hypertension    Irregular heart rate    Stroke ([place])    TIA - approx 2010- no residual   Past Surgical History:  Procedure Laterality Date   ABDOMINAL HYSTERECTOMY     AORTIC ARCH ANGIOGRAPHY N/A 03/28/2019   Procedure: AORTIC ARCH ANGIOGRAPHY;  Surgeon: Waynetta Sandy, MD;  Location: [facility];  Service: Cardiovascular;  Laterality: N/A;   AV FISTULA PLACEMENT Left 06/13/2018   Procedure: ARTERIOVENOUS (AV) FISTULA CREATION;  Surgeon: Rosetta Posner, MD;  Location: [facility];  Service: Vascular;  Laterality: Left;   AV FISTULA PLACEMENT Left 08/18/2018   Procedure: INSERTION OF 4-7MM X 45CM ARTERIOVENOUS (AV) GORE-TEX GRAFT LEFT  FOREARM;  Surgeon: Rosetta Posner, MD;  Location: [facility];  Service: Vascular;  Laterality: Left;   AV FISTULA PLACEMENT Right 04/06/2019   Procedure: INSERTION OF ARTERIOVENOUS (AV) GORE-TEX GRAFT RIGHT UPPER ARM;  Surgeon: Waynetta Sandy, MD;  Location: [facility];  Service: Vascular;  Laterality: Right;   AV FISTULA PLACEMENT Right 04/13/2019   Procedure: INSERTION OF ARTERIOVENOUS (AV) BOVINE  ARTEGRAFT GRAFT RIGHT UPPER EXTREMITY;  Surgeon: Serafina Mitchell, MD;  Location: [facility];  Service: Vascular;  Laterality: Right;   [place] REMOVAL Right 05/16/2019   Procedure: REMOVAL OF ARTERIOVENOUS GORETEX GRAFT ([place]) RIGHT ARM;  Surgeon: Angelia Mould, MD;  Location: [facility];  Service: Vascular;  Laterality: Right;  BASCILIC VEIN TRANSPOSITION Right 03/07/2019   Procedure: First Stage Bascilic Vein Transposition Right Arm;  Surgeon: Serafina Mitchell, MD;  Location: [facility];  Service: Vascular;  Laterality: Right;   CATARACT EXTRACTION Right 2005   INSERTION OF  DIALYSIS CATHETER N/A 08/18/2018   Procedure: INSERTION OF DIALYSIS CATHETER;  Surgeon: Rosetta Posner, MD;  [facility];  Service: Vascular;  Laterality: N/A;   INSERTION OF DIALYSIS CATHETER Right 07/25/2019   Procedure: INSERTION OF DIALYSIS CATHETER RIGHT INTERNAL JUGULAR (TUNNELED);  Surgeon: Virl Cagey, MD;  Location: [facility];  Service: General;  Laterality: Right;   IR FLUORO GUIDE CV LINE RIGHT  12/06/2018   IR PTA ADDL CENTRAL DIALYSIS SEG THRU DIALY CIRCUIT RIGHT Right 12/06/2018   THROMBECTOMY AND REVISION OF ARTERIOVENTOUS (AV) GORETEX  GRAFT Left 09/29/2018   Procedure: INSERTION OF ARTERIOVENTOUS (AV) GORETEX  GRAFT ARM;  Surgeon: Waynetta Sandy, MD;  Location: [facility];  Service: Vascular;  Laterality: Left;   UPPER EXTREMITY ANGIOGRAPHY Right 03/28/2019   Procedure: UPPER EXTREMITY ANGIOGRAPHY;  Surgeon: Waynetta Sandy, MD;  Location: [facility];  Service: Cardiovascular;  Laterality: Right;   VENOGRAM  10/27/2018   Procedure: Venogram;  Surgeon: Marty Heck, MD;  Location: [facility];  Service: Cardiovascular;;  bilateral arm   Family History  Problem Relation Age of Onset   Heart murmur Mother    Heart failure Mother    Diabetes Mother    Cirrhosis Father    Alcoholism Father    Social History:  reports that she has never smoked. She has never used smokeless tobacco. She reports that she does not drink alcohol or use drugs. Allergies: No Known Allergies Medications Prior to Admission  Medication Sig Dispense Refill   Accu-Chek Softclix Lancets lancets      Alcohol Swabs (B-D SINGLE USE SWABS REGULAR) PADS      amLODipine (NORVASC) 10 MG tablet Take 10 mg by mouth daily after lunch.      aspirin EC 81 MG tablet Take 81 mg by mouth daily after lunch.      calcium carbonate (TUMS - DOSED IN MG ELEMENTAL CALCIUM) 500 MG chewable tablet Chew 1 tablet by mouth 2 (two) times daily. With lunch & supper      carvedilol (COREG) 6.25 MG tablet Take 6.25 mg by mouth 2 (two) times daily.     clopidogrel (PLAVIX) 75 MG tablet Take 1 tablet (75 mg total) by mouth daily. 30 tablet 11   fluticasone (FLONASE) 50 MCG/ACT nasal spray Place 1 spray into both nostrils daily as needed for allergies or rhinitis.      gabapentin (NEURONTIN) 100 MG capsule Take 1 capsule (100 mg total) by mouth at bedtime. 30 capsule 2   insulin NPH-regular Human (NOVOLIN 70/30) (70-30) 100 UNIT/ML injection Inject 10-20 Units into the skin See admin instructions. Use twice daily per sliding scale: Blood sugar less than 150=12 units & Blood sugar 200 or more=20 units     methocarbamol (ROBAXIN) 500 MG tablet Take 1 tablet by mouth 3 (three) times daily as needed.     multivitamin (RENA-VIT) TABS tablet Take 1 tablet by mouth daily after lunch.      pantoprazole (PROTONIX) 40 MG tablet Take 1 tablet by mouth daily as needed.      Polyethylene Glycol 3350 (PEG 3350) 17 GM/SCOOP POWD Take 17 g by mouth daily as needed.      traMADol (ULTRAM) 50 MG tablet Take 50  mg by mouth 4 (four) times daily as needed.     isosorbide mononitrate (IMDUR) 30 MG 24 hr tablet Take 30 mg by mouth daily after lunch.      predniSONE (DELTASONE) 20 MG tablet Take 20-40 mg by mouth as directed. Take 40mg  by mouth daily for 7 days, then takes 20mg  by mouth daily for 7 days, then stop      Home: [place] expects to be discharged to:: Private residence Living Arrangements: Other relatives Available Help at Discharge: Family Type of Home: House Home Access: Stairs to enter Technical brewer of Steps: 3 Entrance Stairs-Rails: Right, Left, Can reach both Home Layout: One level Home Equipment: [place] - single point Additional Comments: pt states that she lives with her brother, chart states that she lives with her husband?  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 Initiated sit but then experienced LBP and fell bkwds with control given from behind. Mod A to sit up from this position. Max A +2 for return to supine Transfers Overall transfer level: Needs assistance Equipment used: None Transfers: Lateral/Scoot Transfers, Sit to/from Stand Sit to Stand: +2 physical assistance, Max assist  Lateral/Scoot Transfers: Max assist, +2 physical assistance General transfer comment: pt attempted to stand EOB but was unable with max A +2 due to weakness, LLE>RLE. Scooted to R to [facility] and was able to push with feet but still needed max A +2 to actually move hips Ambulation/Gait General Gait Details: unable    ADL:    Cognition: Cognition Overall Cognitive Status: No family/caregiver present to determine baseline cognitive functioning Orientation Level: Oriented X4 Cognition Arousal/Alertness: Lethargic Behavior During Therapy: WFL for tasks assessed/performed Overall Cognitive Status: No family/caregiver present to determine baseline cognitive functioning General Comments: lethargic and question accuracy of her history  Blood pressure 126/82, pulse (!) 108, temperature 99.2 F (37.3 C), temperature source Oral, resp. rate 16, height 5\' 7"  (1.702 m), weight 63.3 kg, SpO2 99 %.  Physical Exam  Neurological:  Patient bit lethargic but arousable.  Follows commands provides her name and age.  Fair awareness of deficits . Gen: no distress, normal appearing HEENT: oral mucosa pink and moist, NCAT Cardio: Reg rate Chest: normal effort, normal rate of breathing Abd: soft, non-distended Ext: no edema Skin: intact Musculoskeletal: RUE: 5/5  throughout RLE: 5/5 throughout LUE: 4/5 throughout LLE: 3/5 HF, 4/5 KEE, DF, and PF Sensation intact.  Psych: pleasant, normal affect  Results for orders placed or performed during the hospital encounter of 10/20/19 (from the past 24 hour(s))  Glucose, capillary     Status: Abnormal   Collection Time: 10/22/19  6:01 AM  Result Value Ref Range   Glucose-Capillary 143 (H) 70 - 99 mg/dL   Comment 1 Notify RN   Renal function panel     Status: Abnormal   Collection Time: 10/22/19  7:35 AM  Result Value Ref Range   Sodium 138 135 - 145 mmol/L   Potassium 4.8 3.5 - 5.1 mmol/L   Chloride 98 98 - 111 mmol/L   CO2 22 22 - 32 mmol/L   Glucose, Bld 148 (H) 70 - 99 mg/dL   BUN 74 (H)  8 - 23 mg/dL   Creatinine, Ser 10.27 (H) 0.44 - 1.00 mg/dL   Calcium 8.5 (L) 8.9 - 10.3 mg/dL   Phosphorus 4.3 2.5 - 4.6 mg/dL   Albumin 2.3 (L) 3.5 - 5.0 g/dL   GFR calc non Af Amer 3 (L) >60 mL/min   GFR calc Af Amer 4 (L) >60 mL/min   Anion gap 18 (H) 5 - 15  CBC     Status: Abnormal   Collection Time: 10/22/19  7:35 AM  Result Value Ref Range   WBC 13.3 (H) 4.0 - 10.5 K/uL   RBC 3.40 (L) 3.87 - 5.11 MIL/uL   Hemoglobin 9.6 (L) 12.0 - 15.0 g/dL   HCT 30.0 (L) 36.0 - 46.0 %   MCV 88.2 80.0 - 100.0 fL   MCH 28.2 26.0 - 34.0 pg   MCHC 32.0 30.0 - 36.0 g/dL   RDW 17.5 (H) 11.5 - 15.5 %   Platelets 274 150 - 400 K/uL   nRBC 0.0 0.0 - 0.2 %  Culture, blood (routine x 2)     Status: None (Preliminary result)   Collection Time: 10/22/19  7:35 AM   Specimen: BLOOD  Result Value Ref Range   Specimen Description BLOOD RIGHT ANTECUBITAL    Special Requests      BOTTLES DRAWN AEROBIC ONLY Blood Culture adequate volume   Culture      NO GROWTH < 12 HOURS Performed at [facility], [address], [place], [place] [address]    Report Status PENDING   Culture, blood (routine x 2)     Status: None (Preliminary result)   Collection Time: 10/22/19  7:43 AM   Specimen: BLOOD LEFT HAND  Result Value Ref  Range   Specimen Description BLOOD LEFT HAND    Special Requests      BOTTLES DRAWN AEROBIC ONLY Blood Culture adequate volume   Culture      NO GROWTH < 12 HOURS Performed at [facility], [place] [address] [place], [place] [address]    Report Status PENDING   Glucose, capillary     Status: None   Collection Time: 10/22/19 11:40 AM  Result Value Ref Range   Glucose-Capillary 81 70 - 99 mg/dL  Glucose, capillary     Status: Abnormal   Collection Time: 10/22/19  5:48 PM  Result Value Ref Range   Glucose-Capillary 209 (H) 70 - 99 mg/dL   Comment 1 Notify RN    Comment 2 Document in Chart   Glucose, capillary     Status: Abnormal   Collection Time: 10/22/19  9:46 PM  Result Value Ref Range   Glucose-Capillary 198 (H) 70 - 99 mg/dL   Comment 1 Notify RN   Heparin level (unfractionated)     Status: None   Collection Time: 10/22/19 10:28 PM  Result Value Ref Range   Heparin Unfractionated 0.33 0.30 - 0.70 IU/mL   Vas [name] Transcranial Doppler W Bubbles  Result Date: 10/22/2019  Transcranial Doppler with Bubble Indications: Stroke. Comparison Study: No prior study. Performing Technologist: Maudry Mayhew MHA, RDMS, RVT, RDCS  Examination Guidelines: A complete evaluation includes B-mode imaging, spectral Doppler, color Doppler, and power Doppler as needed of all accessible portions of each vessel. Bilateral testing is considered an integral part of a complete examination. Limited examinations for reoccurring indications may be performed as noted.  Summary:  A vascular evaluation was performed. The right ICA siphon was studied. An IV was inserted into the patient's  right forearm. Verbal informed consent was obtained.  No obvious evidence of high intensity transient signals (HITS), therefore no evidence of clinically significant patent foramen ovale (PFO). *See table(s) above for measurements and observations.    Preliminary    Vas US Carotid  Result Date: 10/21/2019 Carotid  Arterial Duplex Study Indications:       CVA and Weakness. Risk Factors:      Hypertension, hyperlipidemia, Diabetes, prior CVA. Other Factors:     ESRD. Comparison Study:  No prior study on file for comparison Performing Technologist: Maudry Mayhew RDMS, RVT, RDCS  Examination Guidelines: A complete evaluation includes B-mode imaging, spectral Doppler, color Doppler, and power Doppler as needed of all accessible portions of each vessel. Bilateral testing is considered an integral part of a complete examination. Limited examinations for reoccurring indications may be performed as noted.  Right Carotid Findings: +----------+--------+--------+--------+------------------+------------------+             PSV cm/s EDV cm/s Stenosis Plaque Description Comments            +----------+--------+--------+--------+------------------+------------------+  CCA Prox   78       14                                   intimal thickening  +----------+--------+--------+--------+------------------+------------------+  CCA Distal 70       11                                   intimal thickening  +----------+--------+--------+--------+------------------+------------------+  ICA Prox   54       13                calcific           Shadowing           +----------+--------+--------+--------+------------------+------------------+  ICA Distal 79       24                                                       +----------+--------+--------+--------+------------------+------------------+  ECA        74       4                                                        +----------+--------+--------+--------+------------------+------------------+ +----------+--------+-------+--------+-------------------+             PSV cm/s EDV cms Describe Arm Pressure (mmHG)  +----------+--------+-------+--------+-------------------+  Subclavian 100                                            +----------+--------+-------+--------+-------------------+  +---------+--------+--+--------+--+  Vertebral PSV cm/s 62 EDV cm/s 10  +---------+--------+--+--------+--+  Left Carotid Findings: +----------+--------+--------+--------+------------------+------------------+             PSV cm/s EDV cm/s Stenosis Plaque Description Comments            +----------+--------+--------+--------+------------------+------------------+  CCA Prox   111  9                                    intimal thickening  +----------+--------+--------+--------+------------------+------------------+  CCA Distal 76       12                                   intimal thickening  +----------+--------+--------+--------+------------------+------------------+  ICA Prox   55       15                heterogenous       Shadowing           +----------+--------+--------+--------+------------------+------------------+  ICA Distal 97       27                                                       +----------+--------+--------+--------+------------------+------------------+  ECA        67       9                                                        +----------+--------+--------+--------+------------------+------------------+ +----------+--------+--------+--------+-------------------+             PSV cm/s EDV cm/s Describe Arm Pressure (mmHG)  +----------+--------+--------+--------+-------------------+  Subclavian 145                                             +----------+--------+--------+--------+-------------------+ +---------+--------+--+--------+--+  Vertebral PSV cm/s 38 EDV cm/s 10  +---------+--------+--+--------+--+  Summary: Right Carotid: Velocities in the right ICA are consistent with a 1-39% stenosis. Left Carotid: Velocities in the left ICA are consistent with a 1-39% stenosis. Vertebrals:  Bilateral vertebral arteries demonstrate antegrade flow. Subclavians: Normal flow hemodynamics were seen in bilateral subclavian              arteries. *See table(s) above for measurements and observations.      Preliminary    Vas [name] Lower Extremity Venous (dvt)  Result Date: 10/22/2019  Lower Venous Study Indications: Stroke.  Limitations: Body habitus. Comparison Study: No prior study on file for comparison Performing Technologist: Maudry Mayhew RDMS, RVT, RDCS  Examination Guidelines: A complete evaluation includes B-mode imaging, spectral Doppler, color Doppler, and power Doppler as needed of all accessible portions of each vessel. Bilateral testing is considered an integral part of a complete examination. Limited examinations for reoccurring indications may be performed as noted.  +---------+---------------+---------+-----------+----------+--------------+  RIGHT     Compressibility Phasicity Spontaneity Properties Thrombus Aging  +---------+---------------+---------+-----------+----------+--------------+  CFV       Full            Yes       Yes                                    +---------+---------------+---------+-----------+----------+--------------+  SFJ  Full                                                             +---------+---------------+---------+-----------+----------+--------------+  FV Prox   Full                                                             +---------+---------------+---------+-----------+----------+--------------+  FV Mid    Full                                                             +---------+---------------+---------+-----------+----------+--------------+  FV Distal Full                                                             +---------+---------------+---------+-----------+----------+--------------+  PFV       Full                                                             +---------+---------------+---------+-----------+----------+--------------+  POP       Full            No        No                                     +---------+---------------+---------+-----------+----------+--------------+  PTV       Full                                                              +---------+---------------+---------+-----------+----------+--------------+  PERO      Full                                                             +---------+---------------+---------+-----------+----------+--------------+   +---------+---------------+---------+-----------+----------+--------------+  LEFT      Compressibility Phasicity Spontaneity Properties Thrombus Aging  +---------+---------------+---------+-----------+----------+--------------+  CFV       Full            Yes       Yes                                    +---------+---------------+---------+-----------+----------+--------------+  SFJ       Full                                                             +---------+---------------+---------+-----------+----------+--------------+  FV Prox   Full                                                             +---------+---------------+---------+-----------+----------+--------------+  FV Mid    Full                                                             +---------+---------------+---------+-----------+----------+--------------+  FV Distal Full                                                             +---------+---------------+---------+-----------+----------+--------------+  PFV       Full                                                             +---------+---------------+---------+-----------+----------+--------------+  POP       Full            Yes       Yes                                    +---------+---------------+---------+-----------+----------+--------------+  PTV                                                        Not visualized  +---------+---------------+---------+-----------+----------+--------------+  PERO                                                       Not visualized  +---------+---------------+---------+-----------+----------+--------------+   Left Technical Findings: Not visualized segments include Posterior and peroneal veins.   Summary: Right:  There is no evidence of deep vein thrombosis in the lower extremity. Left: There is no evidence of deep vein thrombosis in the lower extremity. However, portions of this examination were limited- see technologist comments above.  *See table(s) above for measurements and observations. Electronically signed by Curt Jews MD on 10/22/2019 at 7:49:26 AM.  Final    Vas [name] Upper Extremity Venous Duplex  Result Date: 10/22/2019 UPPER VENOUS STUDY  Indications: Incidental finding of IJ thrombus while performing carotid duplex. Stroke. Comparison Study: No prior study on file Performing Technologist: Maudry Mayhew RDMS, RVT, RDCS  Examination Guidelines: A complete evaluation includes B-mode imaging, spectral Doppler, color Doppler, and power Doppler as needed of all accessible portions of each vessel. Bilateral testing is considered an integral part of a complete examination. Limited examinations for reoccurring indications may be performed as noted.  Right Findings: +----------+------------+---------+-----------+----------+-----------------+  RIGHT      Compressible Phasicity Spontaneous Properties      Summary       +----------+------------+---------+-----------+----------+-----------------+  IJV            None        No         No                       Acute        +----------+------------+---------+-----------+----------+-----------------+  Subclavian     Full        Yes        Yes                                   +----------+------------+---------+-----------+----------+-----------------+  Axillary       Full        Yes        Yes                                   +----------+------------+---------+-----------+----------+-----------------+  Brachial     Partial       No         No                 Age Indeterminate  +----------+------------+---------+-----------+----------+-----------------+  Radial         Full                                                          +----------+------------+---------+-----------+----------+-----------------+  Ulnar          Full                                                         +----------+------------+---------+-----------+----------+-----------------+  Cephalic       Full                                                         +----------+------------+---------+-----------+----------+-----------------+  Basilic                                                   Not visualized    +----------+------------+---------+-----------+----------+-----------------+  Left Findings: +----------+------------+---------+-----------+----------+-------+  LEFT       Compressible Phasicity Spontaneous Properties Summary  +----------+------------+---------+-----------+----------+-------+  Subclavian                 Yes        Yes                         +----------+------------+---------+-----------+----------+-------+  Summary:  Right: No evidence of superficial vein thrombosis in the upper extremity. Findings consistent with acute deep vein thrombosis involving the right internal jugular vein. Findings consistent with age indeterminate deep vein thrombosis involving the one of the paired veins of the right brachial veins.  Left: No evidence of thrombosis in the subclavian.  *See table(s) above for measurements and observations.  Diagnosing physician: Curt Jews MD Electronically signed by Curt Jews MD on 10/22/2019 at 7:49:40 AM.    Final      Assessment/Plan: Diagnosis: Impaired gait and mobility secondary to acute right basal ganglia infarct 1. Does the need for close, 24 hr/day medical supervision in concert with the patient's rehab needs make it unreasonable for this patient to be served in a less intensive setting? Yes 2. Co-Morbidities requiring supervision/potential complications: ESRD, HTN, HLD, DM2 3. Due to safety, skin/wound care, disease management, medication administration, pain management and patient education, does the patient  require 24 hr/day rehab nursing? Yes 4. Does the patient require coordinated care of a physician, rehab nurse, therapy disciplines of PT, OT, SLP to address physical and functional deficits in the context of the above medical diagnosis(es)? Yes Addressing deficits in the following areas: balance, endurance, locomotion, strength, transferring, bowel/bladder control, bathing, dressing, feeding, grooming, toileting and psychosocial support 5. Can the patient actively participate in an intensive therapy program of at least 3 hrs of therapy per day at least 5 days per week? Yes 6. The potential for patient to make measurable gains while on inpatient rehab is good 7. Anticipated functional outcomes upon discharge from inpatient rehab are modified independent  with PT, modified independent with OT, independent with SLP. 8. Estimated rehab length of stay to reach the above functional goals is: 10-14 days 9. Anticipated discharge destination: Home 10. Overall Rehab/Functional Prognosis: excellent  RECOMMENDATIONS: This patient's condition is appropriate for continued rehabilitative care in the following setting: CIR Patient has agreed to participate in recommended program. Yes Note that insurance prior authorization may be required for reimbursement for recommended care.  Comment: Mrs. Meas would be an excellent rehabilitation candidate and would be able to tolerate 3H of daily therapy.  Lavon Paganini Angiulli, PA-C 10/23/2019   I have personally performed a face to face diagnostic evaluation, including, but not limited to relevant history and physical exam findings, of this patient and developed relevant assessment and plan.  Additionally, I have reviewed and concur with the physician assistant's documentation above.  Leeroy Cha, MD

## 2019-10-23 NOTE — Progress Notes (Signed)
STROKE TEAM PROGRESS NOTE   INTERVAL HISTORY Pt is seen in HD unit. No acute event overnight. Awake alert, afebrile overnight. Blood culture 1/4 grew Pseudomonas so far. Now on cefepime. Continue heparin IV.   OBJECTIVE Vitals:   10/23/19 1030 10/23/19 1100 10/23/19 1130 10/23/19 1200  BP: (!) 172/94 (!) 170/77 (!) 162/85 (!) 162/87  Pulse: 92 98 92 79  Resp:      Temp:      TempSrc:      SpO2:      Weight:      Height:        CBC:  Recent Labs  Lab 10/20/19 0659  10/22/19 0735 10/23/19 0640  WBC 15.7*   < > 13.3* 11.1*  NEUTROABS 12.4*  --   --   --   HGB 9.7*   < > 9.6* 8.1*  HCT 32.8*   < > 30.0* 25.5*  MCV 91.6   < > 88.2 87.9  PLT 348   < > 274 210   < > = values in this interval not displayed.    Basic Metabolic Panel:  Recent Labs  Lab 10/22/19 0735 10/23/19 0640  NA 138 136  K 4.8 4.8  CL 98 98  CO2 22 22  GLUCOSE 148* 174*  BUN 74* 87*  CREATININE 10.27* 12.23*  CALCIUM 8.5* 8.1*  PHOS 4.3 4.9*    Lipid Panel:     Component Value Date/Time   CHOL 150 10/21/2019 0405   TRIG 192 (H) 10/21/2019 0405   HDL 47 10/21/2019 0405   CHOLHDL 3.2 10/21/2019 0405   VLDL 38 10/21/2019 0405   LDLCALC 65 10/21/2019 0405   HgbA1c:  Lab Results  Component Value Date   HGBA1C 7.6 (H) 10/21/2019   Urine Drug Screen:     Component Value Date/Time   LABOPIA NONE DETECTED 07/19/2019 1220   COCAINSCRNUR NONE DETECTED 07/19/2019 1220   LABBENZ NONE DETECTED 07/19/2019 1220   AMPHETMU NONE DETECTED 07/19/2019 1220   THCU NONE DETECTED 07/19/2019 1220   LABBARB NONE DETECTED 07/19/2019 1220    Alcohol Level     Component Value Date/Time   ETH <10 07/19/2019 1405    IMAGING  Dg Chest 1 View 10/20/2019 IMPRESSION:  Possible trace left pleural effusion.   Ct Head Wo Contrast 10/20/2019 IMPRESSION:  1. Acute right basal ganglia infarcts.  2. Mild chronic small vessel ischemic disease.   Mr Brain 86 Contrast Mr Angio Head Wo  Contrast 10/20/2019 IMPRESSION:  1. Acute right basal ganglia infarcts.  2. Chronic ischemia with multiple old infarcts as above.  3. Negative head MRA.   TTE 10/20/2019 Impression 1. Left ventricular ejection fraction, by visual estimation, is 60 to 65%. The left ventricle has normal function. There is mildly increased left ventricular hypertrophy. 2. Left ventricular diastolic parameters are consistent with Grade I diastolic dysfunction (impaired relaxation). 3. Global right ventricle has normal systolic function.The right ventricular size is normal. 4. Right atrial size was normal. 5. Trivial pericardial effusion is present. 6. Mild mitral annular calcification. 7. The mitral valve is normal in structure. Trace mitral valve regurgitation. No evidence of mitral stenosis. 8. The tricuspid valve is normal in structure. Tricuspid valve regurgitation is mild. 9. The aortic valve is tricuspid. Aortic valve regurgitation is not visualized. Mild to moderate aortic valve sclerosis/calcification without any evidence of aortic stenosis. 10. The pulmonic valve was normal in structure. Pulmonic valve regurgitation is not visualized. 11. The inferior vena cava is normal in size  with greater than 50% respiratory variability, suggesting right atrial pressure of 3 mmHg. 12. Normal LV systolic function; grade 1 diastolic dysfunction; mild LVH; mild LAE.   Bilateral Carotid Dopplers  Right Carotid: Velocities in the right ICA are consistent with a 1-39% stenosis. Left Carotid: Velocities in the left ICA are consistent with a 1-39% stenosis. Vertebrals:  Bilateral vertebral arteries demonstrate antegrade flow. Subclavians: Normal flow hemodynamics were seen in bilateral subclavian              arteries.  Bilateral Lower Extremity Venous Dopplers Right: There is no evidence of deep vein thrombosis in the lower extremity. Left: There is no evidence of deep vein thrombosis in the lower extremity.  However, portions of this examination were limited- see technologist comments above.  Bilateral upper Extremity Venous Dopplers Right: No evidence of superficial vein thrombosis in the upper extremity. Findings consistent with acute deep vein thrombosis involving the right internal jugular vein. Findings consistent with age indeterminate deep vein thrombosis involving the one of the paired veins of the right brachial veins. Left: No evidence of thrombosis in the subclavian.  ECG - SR rate 83 BPM. (See cardiology reading for complete details)  Transcranial Dopplers 10/21/19 Summary: A vascular evaluation was performed. The right ICA siphon was studied. An IV was inserted into the patient's right forearm. Verbal informed consent was obtained. No obvious evidence of high intensity transient signals (HITS), therefore no evidence of clinically significant patent foramen ovale (PFO).  PHYSICAL EXAM  Temp:  [98.2 F (36.8 C)-99.5 F (37.5 C)] 99.5 F (37.5 C) (11/30 0850) Pulse Rate:  [79-108] 79 (11/30 1200) Resp:  [16-24] 24 (11/30 0850) BP: (126-172)/(70-94) 162/87 (11/30 1200) SpO2:  [98 %-100 %] 100 % (11/30 0850) Weight:  [63.3 kg] 63.3 kg (11/30 0850)  General - Well nourished, well developed, in no apparent distress, in HD.  Ophthalmologic - fundi not visualized due to noncooperation.  Cardiovascular - Regular rhythm and rate.  Neuro - awake alert, eyes open, mild abulia. Orientated to time place and people. No aphasia, following all simple commands, able to name and repeat. PERRL, visual field full, EOMI, no gaze preference. Mild left facial asymmetry, tongue midline. LUE 4/5 with pronator drift, finger grip 4/5. LLE proximal 3/5, knee flexion 4/5, foot DF and PF 4/5. Sensation symmetrical bilaterally, FTN intact bilaterally although slow on the left. DTR 1+ on the left and no babinski. Gait not tested.    ASSESSMENT/PLAN [name] is a 68 y.o. female with  history of ESRD ( on dialysis M-W-F), TIA 2010, HTN, HLD, Ha, DM2 who presented to [facility] with c/o dizziness, left arm weakness and left leg numbness. She did not receive IV t-PA due to late presentation (>4.5 hours from time of onset)  Stroke:  acute right BG, CR, caudate scattered infarcts, embolic pattern, source unclear  CT head - Acute right basal ganglia infarcts.   MRI head -  Acute right BG, CR, caudate scattered infarcts. Chronic ischemia with multiple old infarcts as above.   MRA head - Negative   Carotid Doppler unremarkable  2D Echo - EF 60 - 65%  Bilateral LE Venous Dopplers no DVT  Bilateral UE venous doppler - right IJ acute DVT, right brachial vein age-indetermined DVT  TCD bubble study - no PFO but lack of effort on valsalva.  Given bacteremia, may consider TEE to rule out endocarditis  Recommend outpt 30 day cardiac event monitoring to rule out afib  LDL - 65  HgbA1c - 7.6  VTE prophylaxis - [place] Heparin  aspirin 81 mg daily and clopidogrel 75 mg daily prior to admission, now on IV heparin. May consider transition to oral AC once stable   Patient counseled to be compliant with her antithrombotic medications  Ongoing aggressive stroke risk factor management  Therapy recommendations:  CIR  Disposition:  Pending  Right IJ acute DVT  Found on UE venous Doppler  Could be related to dialysis catheter  On heparin IV - can consider transition to [facility] once stable  Treatment as per primary team  ESRD on HD  04/2019 right arm AV graft removed  Cre 7.76->8.86->10.27->12.23  MWF schedule  Nephrology on board  HD today  Bacteremia  06/2019 -admitted for altered mental status, blood culture showed positive gram negative rods, confirmed for Pseudomonas infection, concerning for coming from left 3-4 discitis/osteomyelitis.  Treated with cefepime initially and then transition to [facility] for 6 weeks.  08/2019 blood culture repeat  negative.  10/22/19 1/4 blood culture - pseudomonas - on cefepime now  May need to consider TEE to rule out endocarditis.  Hypertension  Home BP meds: Coreg ; Norvasc ; Imdur  Continue Imdur  Resume coreg today  BP stable but on the higher end  Long-term BP goal normotensive  Diabetes  Home diabetic meds: insulin  Current diabetic meds: insulin  HgbA1c 7.6, goal < 7.0  SSI  CBG monitoring  Close PCP follow-up  Fever   Tmax 101.9->100.7->afebrile  WBC 15.7->10.9->13.3 ->11.1  blood cultures 1/4 pseudomonas - on cefepime  UA WBC 6-10  Not on Abx yet  Other Stroke Risk Factors  Advanced age  Hx TIA in 2010  Other Active Problems  Anemia due to ESRD - Hb - 9.7->8.6->9.6   Hospital day # 3   Rosalin Hawking, MD PhD Stroke Neurology 10/23/2019 12:52 PM   To contact Stroke Continuity provider, please refer to [email or web address] After hours, contact General Neurology

## 2019-10-23 NOTE — Progress Notes (Signed)
Inpatient Rehabilitation-Admissions Coordinator   [facility] met with pt bedside as follow up from PM&R consult to discuss recommended rehab program. Pt tired but conversational during discussion with [facility] as she had just returned from HD. Pt reports Mod I with most functional tasks prior to CVA and is motivated to return to [place]. Pt is interested in CIR at this time. I have confirmed DC support from her son.   Noted PT changed their recommendation from CIR to SNF today but anticipate pt's low tolerance level was due to her recent HD treatment and pain. Will reassess pt appropriateness tomorrow on non-dialysis day and await OT evaluation.   Jhonnie Garner, OTR/L  Rehab Admissions Coordinator  [phone] 10/23/2019 3:32 PM

## 2019-10-23 NOTE — Progress Notes (Signed)
Pharmacy Antibiotic Note  [name] is a 68 y.o. female admitted on 10/20/2019 with Pseudomonas bacteremia.  Pharmacy has been consulted for Cefepime dosing.  The patient is ESRD-MWF with multiple failed accesses and currently dialyzing via RIJ-TDC. 2 of 8 BCx now growing GNR with BCID detecting Psuedomonas. The patient is receiving HD this AM - the HD-RN was contacted to give Cefepime at the end of the patient's HD session today, the medication is already up on the unit.  Will do daily Cefepime dosing for now since it is likely that the patient's [facility] will need removal/replacement.  Plan: - Cefepime 2g IV s/p HD today followed by 1g/24h at bedtime - Will continue to follow HD schedule/duration, culture results, LOT, and antibiotic de-escalation plans   Height: 5\' 7"  (170.2 cm) Weight: 139 lb 8.8 oz (63.3 kg) IBW/kg (Calculated) : 61.6  Temp (24hrs), Avg:99.1 F (37.3 C), Min:98.2 F (36.8 C), Max:99.5 F (37.5 C)  Recent Labs  Lab 10/20/19 0659 10/21/19 0405 10/22/19 0735 10/23/19 0640  WBC 15.7* 10.9* 13.3* 11.1*  CREATININE 7.76* 8.86* 10.27* 12.23*    Estimated Creatinine Clearance: 4.2 mL/min (A) (by C-G formula based on SCr of 12.23 mg/dL (H)).    No Known Allergies  Antimicrobials this admission: Cefepime 11/30 >>  Microbiology results: 11/27 COVID >> neg 11/29 BCx >> 2/8 GNR with BCID detecting PSA  Thank you for allowing pharmacy to be a part of this patient's care.  Alycia Rossetti, PharmD, BCPS Clinical Pharmacist Clinical phone for 10/23/2019: [phone] 10/23/2019 11:00 AM   **Pharmacist phone directory can now be found on [email or web address] ([email or web address]).  Listed under [place].

## 2019-10-23 NOTE — Progress Notes (Signed)
ANTICOAGULATION CONSULT NOTE - Follow-Up  Pharmacy Consult for Heparin Indication: RIJ DVT  No Known Allergies  Patient Measurements: Height: 5\' 7"  (170.2 cm) Weight: 139 lb 8.8 oz (63.3 kg) IBW/kg (Calculated) : 61.6 Heparin Dosing Weight: 63 kg  Vital Signs: Temp: 99.5 F (37.5 C) (11/30 0850) Temp Source: Oral (11/30 0850) BP: 172/94 (11/30 1030) Pulse Rate: 92 (11/30 1030)  Labs: Recent Labs    10/21/19 0405 10/22/19 0735 10/22/19 2228 10/23/19 0640  HGB 8.6* 9.6*  --  8.1*  HCT 27.4* 30.0*  --  25.5*  PLT 274 274  --  210  HEPARINUNFRC  --   --  0.33 0.24*  CREATININE 8.86* 10.27*  --  12.23*    Estimated Creatinine Clearance: 4.2 mL/min (A) (by C-G formula based on SCr of 12.23 mg/dL (H)).  Assessment: 47 YOF with ESRD who presented on 11/27 with new CVA and is now found to have RIJ DVT and pharmacy consulted to start Heparin anticoagulation - low goal and no bolus with concurrent CVA.   Heparin level this morning is slightly SUBtherapeutic (HL 0.24 < 0.33, goal of 0.3-0.5). Hgb/Hct slight drop - no bleeding noted.   Goal of Therapy:  Heparin level 0.3-0.5 units/ml Monitor platelets by anticoagulation protocol: Yes   Plan:  - Increase Heparin to 900 units/hr (9 ml/hr) - Will continue to monitor for any signs/symptoms of bleeding and will follow up with heparin level in 8 hours   Thank you for allowing pharmacy to be a part of this patient's care.  Alycia Rossetti, PharmD, BCPS Clinical Pharmacist Clinical phone for 10/23/2019: [phone] 10/23/2019 10:47 AM   **Pharmacist phone directory can now be found on [email or web address] ([email or web address]).  Listed under [place].

## 2019-10-23 NOTE — Evaluation (Signed)
Speech Language Pathology Evaluation Patient Details Name: Aremi Deaton MRN: [id number] DOB: Aug 14, 1951 Today's Date: 10/23/2019 Time: [id number] SLP Time Calculation (min) (ACUTE ONLY): 20 min  Problem List:  Patient Active Problem List   Diagnosis Date Noted  . Acute ischemic stroke ([place]) 10/20/2019  . Hyperkalemia 09/10/2019  . End-stage renal disease on hemodialysis ([place])   . Nausea vomiting and diarrhea   . Bacteremia 07/20/2019  . ESRD needing dialysis ([place]) 05/15/2019  . Anemia of chronic renal failure 04/03/2018  . Essential hypertension 04/03/2018  . Type 2 diabetes mellitus with diabetic nephropathy ([place]) 04/03/2018   Past Medical History:  Past Medical History:  Diagnosis Date  . Arthritis    hands  . Constipation   . Diabetes mellitus without complication (HCC)    Type II  . ESRD (end stage renal disease) ([place])     M/W/F- Hemodialysis  . GERD (gastroesophageal reflux disease)   . Headache   . Hyperlipidemia   . Hypertension   . Irregular heart rate   . Stroke [facility])    TIA - approx 2010- no residual   Past Surgical History:  Past Surgical History:  Procedure Laterality Date  . ABDOMINAL HYSTERECTOMY    . AORTIC ARCH ANGIOGRAPHY N/A 03/28/2019   Procedure: AORTIC ARCH ANGIOGRAPHY;  Surgeon: Waynetta Sandy, MD;  Location: [place] [facility];  Service: Cardiovascular;  Laterality: N/A;  . AV FISTULA PLACEMENT Left 06/13/2018   Procedure: ARTERIOVENOUS (AV) FISTULA CREATION;  Surgeon: Rosetta Posner, MD;  Location: [place];  Service: Vascular;  Laterality: Left;  . AV FISTULA PLACEMENT Left 08/18/2018   Procedure: INSERTION OF 4-7MM X 45CM ARTERIOVENOUS (AV) GORE-TEX GRAFT LEFT  FOREARM;  Surgeon: Rosetta Posner, MD;  Location: [place];  Service: Vascular;  Laterality: Left;  . AV FISTULA PLACEMENT Right 04/06/2019   Procedure: INSERTION OF ARTERIOVENOUS (AV) GORE-TEX GRAFT RIGHT UPPER ARM;  Surgeon: Waynetta Sandy, MD;  Location: [place];   Service: Vascular;  Laterality: Right;  . AV FISTULA PLACEMENT Right 04/13/2019   Procedure: INSERTION OF ARTERIOVENOUS (AV) BOVINE  ARTEGRAFT GRAFT RIGHT UPPER EXTREMITY;  Surgeon: Serafina Mitchell, MD;  Location: [place];  Service: Vascular;  Laterality: Right;  . [place] REMOVAL Right 05/16/2019   Procedure: REMOVAL OF ARTERIOVENOUS GORETEX GRAFT ([place]) RIGHT ARM;  Surgeon: Angelia Mould, MD;  Location: [place];  Service: Vascular;  Laterality: Right;  . BASCILIC VEIN TRANSPOSITION Right 03/07/2019   Procedure: First Stage Bascilic Vein Transposition Right Arm;  Surgeon: Serafina Mitchell, MD;  Location: [place];  Service: Vascular;  Laterality: Right;  . CATARACT EXTRACTION Right 2005  . INSERTION OF DIALYSIS CATHETER N/A 08/18/2018   Procedure: INSERTION OF DIALYSIS CATHETER;  Surgeon: Rosetta Posner, MD;  Location: [place];  Service: Vascular;  Laterality: N/A;  . INSERTION OF DIALYSIS CATHETER Right 07/25/2019   Procedure: INSERTION OF DIALYSIS CATHETER RIGHT INTERNAL JUGULAR (TUNNELED);  Surgeon: Virl Cagey, MD;  Location: [facility];  Service: General;  Laterality: Right;  . IR FLUORO GUIDE CV LINE RIGHT  12/06/2018  . IR PTA ADDL CENTRAL DIALYSIS SEG THRU DIALY CIRCUIT RIGHT Right 12/06/2018  . THROMBECTOMY AND REVISION OF ARTERIOVENTOUS (AV) GORETEX  GRAFT Left 09/29/2018   Procedure: INSERTION OF ARTERIOVENTOUS (AV) GORETEX  GRAFT ARM;  Surgeon: Waynetta Sandy, MD;  Location: [place];  Service: Vascular;  Laterality: Left;  . UPPER EXTREMITY ANGIOGRAPHY Right 03/28/2019   Procedure: UPPER EXTREMITY ANGIOGRAPHY;  Surgeon: Waynetta Sandy, MD;  Location: [place] [facility];  Service: Cardiovascular;  Laterality: Right;  . VENOGRAM  10/27/2018   Procedure: Venogram;  Surgeon: Marty Heck, MD;  Location: [place] [facility];  Service: Cardiovascular;;  bilateral arm   HPI:  [name] with history of ESRD ( on dialysis M-W-F), TIA 2010, HTN, HLD, Ha, DM2  who presented to  [facility] with c/o dizziness, left arm weakness and left leg numbness. MRI revealed acute R basal ganglia infarct. In addition, she has R IJ DVT and spiked a fever 11/27 with concern for sepsis.   Assessment / Plan / Recommendation Clinical Impression  Patient administered a cognitive-linguistic evaluation. Patient's overall auditory comprehension and verbal expression appears [address] for all tasks assessed. Patient is 100% intelligible without evidence of dysarthria. Patient demonstrates moderate cognitive impairments impacting sustained attention, short-term recall and functional problem solving which impacts her safety with functional and familiar tasks. Patient scored 12/22 points on the MoCA with a score of 19 or above considered normal. Patient's family not present to confirm baseline level of cognitive functioning but patient reports that her family provided assistance for most cognitive tasks. However, she also reports she feels her attention and memory deficits are new since admission. Patient would benefit from skilled SLP intervention to maximize her cognitive functioning and overall functional independence.     SLP Assessment  SLP Recommendation/Assessment: Patient needs continued Speech Lanaguage Pathology Services SLP Visit Diagnosis: Attention and concentration deficit Attention and concentration deficit following: Cerebral infarction    Follow Up Recommendations  CIR, 24 hour supervision/assistance   Frequency and Duration min 2x/week  2 weeks      SLP Evaluation Cognition  Overall Cognitive Status: No family/caregiver present to determine baseline cognitive functioning Orientation Level: Oriented X4 Attention: Sustained Sustained Attention: Impaired Sustained Attention Impairment: Verbal basic;Functional basic Memory: Impaired Memory Impairment: Decreased short term memory Decreased Short Term Memory: Verbal basic;Functional basic Problem Solving:  Impaired Problem Solving Impairment: Functional basic Safety/Judgment: Impaired       Comprehension  Auditory Comprehension Overall Auditory Comprehension: Appears within functional limits for tasks assessed Visual Recognition/Discrimination Discrimination: Not tested Reading Comprehension Reading Status: Not tested    Expression Expression Primary Mode of Expression: Verbal Verbal Expression Overall Verbal Expression: Appears within functional limits for tasks assessed Written Expression Dominant Hand: Right Written Expression: Not tested   Oral / Motor  Oral Motor/Sensory Function Overall Oral Motor/Sensory Function: Mild impairment Facial Symmetry: Abnormal symmetry left Motor Speech Overall Motor Speech: Appears within functional limits for tasks assessed   GO                    Breckin Zafar 10/23/2019, 2:38 PM   [place], [place], [place]

## 2019-10-23 NOTE — Procedures (Signed)
I was present at this dialysis session. I have reviewed the session itself and made appropriate changes.   Vital signs in last 24 hours:  Temp:  [98.2 F (36.8 C)-99.5 F (37.5 C)] 99.5 F (37.5 C) (11/30 0850) Pulse Rate:  [88-108] 89 (11/30 0850) Resp:  [16-24] 24 (11/30 0850) BP: (126-166)/(79-88) 161/88 (11/30 0850) SpO2:  [98 %-100 %] 100 % (11/30 0850) Weight:  [63.3 kg] 63.3 kg (11/30 0850) Weight change: -3 kg Filed Weights   10/22/19 0349 10/23/19 0447 10/23/19 0850  Weight: 66.3 kg 63.3 kg 63.3 kg    Recent Labs  Lab 10/23/19 0640  NA 136  K 4.8  CL 98  CO2 22  GLUCOSE 174*  BUN 87*  CREATININE 12.23*  CALCIUM 8.1*  PHOS 4.9*    Recent Labs  Lab 10/20/19 0659 10/21/19 0405 10/22/19 0735 10/23/19 0640  WBC 15.7* 10.9* 13.3* 11.1*  NEUTROABS 12.4*  --   --   --   HGB 9.7* 8.6* 9.6* 8.1*  HCT 32.8* 27.4* 30.0* 25.5*  MCV 91.6 88.4 88.2 87.9  PLT 348 274 274 210    Scheduled Meds: . acetaminophen  1,000 mg Oral Once  . calcium carbonate  1 tablet Oral 2 times per day  . Chlorhexidine Gluconate Cloth  6 each Topical Daily  . Chlorhexidine Gluconate Cloth  6 each Topical Q0600  . doxercalciferol  1.5 mcg Intravenous Q M,W,F-HD  . gabapentin  100 mg Oral QHS  . insulin aspart  0-5 Units Subcutaneous QHS  . insulin aspart  0-9 Units Subcutaneous TID WC  . isosorbide mononitrate  30 mg Oral QPC lunch  . multivitamin  1 tablet Oral QPC lunch   Continuous Infusions: . sodium chloride Stopped (10/20/19 1317)  . ceFEPime (MAXIPIME) IV    . heparin 800 Units/hr (10/22/19 1515)   PRN Meds:.acetaminophen **OR** acetaminophen (TYLENOL) oral liquid 160 mg/5 mL **OR** acetaminophen, fluticasone, hydrALAZINE, senna-docusate   Donetta Potts,  MD 10/23/2019, 9:25 AM

## 2019-10-23 NOTE — Procedures (Signed)
PROCEDURE SUMMARY:  Successful removal of tunneled right IJ HD catheter. No immediate complications.  EBL < 5 mL. Patient tolerated well.  Pressure dressing (gauze + Tegaderm) applied to site.  Please see imaging section of Epic for full dictation.   Earley Abide PA-C 10/23/2019 3:55 PM

## 2019-10-23 NOTE — Progress Notes (Signed)
OT Cancellation Note  Patient Details Name: [name] MRN: [id number] DOB: Jun 01, 1951   Cancelled Treatment:    Reason Eval/Treat Not Completed: Patient at procedure or test/ unavailable Attempted to initiate OT POC this AM, pt at HD. Will continue to follow as available and appropriate.  Zenovia Jarred, MSOT, OTR/L [place] OT/ Acute Relief OT [facility] Office: [place] 10/23/2019, 2:42 PM

## 2019-10-23 NOTE — Care Management Important Message (Signed)
Important Message  Patient Details  Name: [name] MRN: [id number] Date of Birth: 04-01-1951   Medicare Important Message Given:  Yes     Jerad Dunlap [address] 10/23/2019, 3:46 PM

## 2019-10-23 NOTE — Progress Notes (Signed)
Hep was off for IR procedure to remove tunneled HD cath No orders placed to resume  Spoke with Dr Anselm Pancoast who ok to resume heparin  Resume at previous dose 900 units/hr Will recheck lvls with am labs  Barth Kirks, PharmD, BCPS, BCCCP Clinical Pharmacist [phone]  Please check AMION for all [place] numbers  10/23/2019 8:29 PM

## 2019-10-23 NOTE — Progress Notes (Signed)
Physical Therapy Treatment Patient Details Name: Noy Lilla MRN: [id number] DOB: Feb 01, 1951 Today's Date: 10/23/2019    History of Present Illness Ms. Kanye Savidge is a 68 y.o. female with history of ESRD ( on dialysis M-W-F), TIA 2010, HTN, HLD, Ha, DM2 who presented to  [facility] with c/o dizziness, left arm weakness and left leg numbness. MRI revealed acute R basal ganglia infarct. In addition, she has R IJ DVT and spiked a fever 11/27 with concern for sepsis.     PT Comments    Patient received in bed after seeing SLP. Agreeable to PT, although reports L hip pain. Reports it started when she had CVA. Patient requires mod/max assist to go from supine to sitting at edge of bed. Unable to tolerate sitting up for more than a couple of minutes due to hip pain. Patient assisted back to supine and positioned on right side with pillow between legs. She reported that helped some. RN notified that patient would like pain medication. Patient will continue to benefit from skilled PT while here to improve functional mobility and independence.       Follow Up Recommendations  SNF- not sure if she could tolerate CIR     Equipment Recommendations  None recommended by PT;Other (comment)(TBD)    Recommendations for Other Services Rehab consult     Precautions / Restrictions Precautions Precautions: Fall Restrictions Weight Bearing Restrictions: No    Mobility  Bed Mobility Overal bed mobility: Needs Assistance Bed Mobility: Supine to Sit;Sit to Supine     Supine to sit: Max assist Sit to supine: Min assist   General bed mobility comments: max assist to raise trunk to sitting position due to pain in left hip. Unable to tolerate sitting for more than a couple of minutes. Started to lie back down. Required assist for positioning once in supine  Transfers                 General transfer comment: unable  Ambulation/Gait             General Gait Details:  unable   Stairs             Wheelchair Mobility    Modified Rankin (Stroke Patients Only) Modified Rankin (Stroke Patients Only) Pre-Morbid Rankin Score: No symptoms Modified Rankin: Severe disability     Balance Overall balance assessment: Needs assistance Sitting-balance support: Bilateral upper extremity supported;Feet supported Sitting balance-Leahy Scale: Poor                                      Cognition Arousal/Alertness: Awake/alert Behavior During Therapy: WFL for tasks assessed/performed Overall Cognitive Status: No family/caregiver present to determine baseline cognitive functioning                                        Exercises      General Comments        Pertinent Vitals/Pain Pain Assessment: 0-10 Pain Score: 5  Pain Location: left hip Pain Descriptors / Indicators: Aching;Grimacing;Guarding Pain Intervention(s): Limited activity within patient's tolerance;Monitored during session;Repositioned;Patient requesting pain meds-RN notified    Home Living       Type of Home: House              Prior Function  PT Goals (current goals can now be found in the care plan section) Acute Rehab PT Goals Patient Stated Goal: none stated PT Goal Formulation: With patient Time For Goal Achievement: 11/05/19 Potential to Achieve Goals: Good Progress towards PT goals: Not progressing toward goals - comment(limited by pain)    Frequency    Min 4X/week      PT Plan Current plan remains appropriate    Co-evaluation              AM-PAC PT "6 Clicks" Mobility   Outcome Measure  Help needed turning from your back to your side while in a flat bed without using bedrails?: A Lot Help needed moving from lying on your back to sitting on the side of a flat bed without using bedrails?: A Lot Help needed moving to and from a bed to a chair (including a wheelchair)?: Total Help needed standing up  from a chair using your arms (e.g., wheelchair or bedside chair)?: Total Help needed to walk in hospital room?: Total Help needed climbing 3-5 steps with a railing? : Total 6 Click Score: 8    End of Session   Activity Tolerance: Patient limited by pain Patient left: in bed;with bed alarm set;with call bell/phone within reach Nurse Communication: Mobility status;Patient requests pain meds PT Visit Diagnosis: Muscle weakness (generalized) (M62.81);Other abnormalities of gait and mobility (R26.89);Pain Pain - Right/Left: Left Pain - part of body: Hip     Time: 1430-1445 PT Time Calculation (min) (ACUTE ONLY): 15 min  Charges:  $Therapeutic Activity: 8-22 mins                     Syrita Dovel, PT, GCS 10/23/19,3:04 PM

## 2019-10-23 NOTE — Plan of Care (Signed)
  Problem: Education: Goal: Knowledge of General Education information will improve Description Including pain rating scale, medication(s)/side effects and non-pharmacologic comfort measures Outcome: Progressing   Problem: Health Behavior/Discharge Planning: Goal: Ability to manage health-related needs will improve Outcome: Progressing   Problem: Clinical Measurements: Goal: Ability to maintain clinical measurements within normal limits will improve Outcome: Progressing Goal: Will remain free from infection Outcome: Progressing Goal: Diagnostic test results will improve Outcome: Progressing Goal: Respiratory complications will improve Outcome: Progressing Goal: Cardiovascular complication will be avoided Outcome: Progressing   Problem: Activity: Goal: Risk for activity intolerance will decrease Outcome: Progressing   Problem: Nutrition: Goal: Adequate nutrition will be maintained Outcome: Progressing   Problem: Coping: Goal: Level of anxiety will decrease Outcome: Progressing   Problem: Elimination: Goal: Will not experience complications related to bowel motility Outcome: Progressing Goal: Will not experience complications related to urinary retention Outcome: Progressing   Problem: Pain Managment: Goal: General experience of comfort will improve Outcome: Progressing   Problem: Safety: Goal: Ability to remain free from injury will improve Outcome: Progressing   Problem: Skin Integrity: Goal: Risk for impaired skin integrity will decrease Outcome: Progressing   Problem: Education: Goal: Knowledge of disease or condition will improve Outcome: Progressing Goal: Knowledge of secondary prevention will improve Outcome: Progressing Goal: Knowledge of patient specific risk factors addressed and post discharge goals established will improve Outcome: Progressing Goal: Individualized Educational Video(s) Outcome: Progressing   Problem: Coping: Goal: Will verbalize  positive feelings about self Outcome: Progressing Goal: Will identify appropriate support needs Outcome: Progressing   Problem: Health Behavior/Discharge Planning: Goal: Ability to manage health-related needs will improve Outcome: Progressing   Problem: Self-Care: Goal: Ability to participate in self-care as condition permits will improve Outcome: Progressing Goal: Verbalization of feelings and concerns over difficulty with self-care will improve Outcome: Progressing Goal: Ability to communicate needs accurately will improve Outcome: Progressing   Problem: Nutrition: Goal: Risk of aspiration will decrease Outcome: Progressing Goal: Dietary intake will improve Outcome: Progressing   Problem: Ischemic Stroke/TIA Tissue Perfusion: Goal: Complications of ischemic stroke/TIA will be minimized Outcome: Progressing  Willmer Fellers, BSN, RN 

## 2019-10-23 NOTE — Consult Note (Signed)
[facility] for Infectious Disease  Total days of antibiotics 2 Reason for Consult:recurrent pseudomonal bacteremia    Referring Physician: Broadus John  Active Problems:   ESRD needing dialysis ([place])   Anemia of chronic renal failure   Type 2 diabetes mellitus with diabetic nephropathy (HCC)   Acute ischemic stroke (HCC)    HPI: [name] is a 68 y.o. female with ESRD, T2DM, HTN, HLD, admitted with new onset left sided weakness. Denies sob, ha, word finding difficulty, no fever chills or nightsweats. In the ED, found to have leukocytosis of 15.7K. stroke work up revealed new acute right basal ganglia infarct as well as chronic small vessel ischemic changes. Infectious work up revealed pseudomonas by BCID. Interestingly, she was treated with 6 wk of fortaz for pseudomonal bacteremia and early discitis in august 2020. She has a tunneled IJ that was placed on Sep 1st, 2020. Had HD catheter removed by IR this afternoon. Stroke work up also showing acute DVT in right IJ  Also, had been treated for vascular graft infection in June 2020.     Past Medical History:  Diagnosis Date  . Arthritis    hands  . Constipation   . Diabetes mellitus without complication (HCC)    Type II  . ESRD (end stage renal disease) ([place])     M/W/F- Hemodialysis  . GERD (gastroesophageal reflux disease)   . Headache   . Hyperlipidemia   . Hypertension   . Irregular heart rate   . Stroke [facility])    TIA - approx 2010- no residual    Allergies: No Known Allergies   MEDICATIONS: . acetaminophen  1,000 mg Oral Once  . calcium carbonate  1 tablet Oral 2 times per day  . carvedilol  6.25 mg Oral BID  . Chlorhexidine Gluconate Cloth  6 each Topical Daily  . Chlorhexidine Gluconate Cloth  6 each Topical Q0600  . doxercalciferol  1.5 mcg Intravenous Q M,W,F-HD  . gabapentin  100 mg Oral QHS  . insulin aspart  0-5 Units Subcutaneous QHS  . insulin aspart  0-9 Units Subcutaneous TID WC  . isosorbide  mononitrate  30 mg Oral QPC lunch  . multivitamin  1 tablet Oral QPC lunch    Social History   Tobacco Use  . Smoking status: Never Smoker  . Smokeless tobacco: Never Used  Substance Use Topics  . Alcohol use: Never    Frequency: Never  . Drug use: Never    Family History  Problem Relation Age of Onset  . Heart murmur Mother   . Heart failure Mother   . Diabetes Mother   . Cirrhosis Father   . Alcoholism Father      Review of Systems  Constitutional: Negative for fever, chills, diaphoresis, activity change, appetite change, fatigue and unexpected weight change.  HENT: Negative for congestion, sore throat, rhinorrhea, sneezing, trouble swallowing and sinus pressure.  Eyes: Negative for photophobia and visual disturbance.  Respiratory: Negative for cough, chest tightness, shortness of breath, wheezing and stridor.  Cardiovascular: Negative for chest pain, palpitations and leg swelling.  Gastrointestinal: Negative for nausea, vomiting, abdominal pain, diarrhea, constipation, blood in stool, abdominal distention and anal bleeding.  Genitourinary: Negative for dysuria, hematuria, flank pain and difficulty urinating.  Musculoskeletal: Negative for myalgias, back pain, joint swelling, arthralgias and gait problem.  Skin: Negative for color change, pallor, rash and wound.  Neurological: + left arm weakness Hematological: Negative for adenopathy. Does not bruise/bleed easily.  Psychiatric/Behavioral: Negative for behavioral problems,  confusion, sleep disturbance, dysphoric mood, decreased concentration and agitation.     OBJECTIVE: Temp:  [98.2 F (36.8 C)-99.5 F (37.5 C)] 99.5 F (37.5 C) (11/30 0850) Pulse Rate:  [79-108] 79 (11/30 1200) Resp:  [16-24] 24 (11/30 0850) BP: (126-172)/(70-94) 162/87 (11/30 1200) SpO2:  [98 %-100 %] 100 % (11/30 0850) Weight:  [63.3 kg] 63.3 kg (11/30 0850) Physical Exam  Constitutional:  oriented to person, place, and time. appears  chronically ill and well-nourished. No distress.  HENT: [place]/AT, PERRLA, no scleral icterus Mouth/Throat: Oropharynx is clear and moist. No oropharyngeal exudate.  Cardiovascular: Normal rate, regular rhythm and normal heart sounds. Exam reveals no gallop and no friction rub.  No murmur heard.  Pulmonary/Chest: Effort normal and breath sounds normal. No respiratory distress.  has no wheezes.  Neck = supple, no nuchal rigidity Abdominal: Soft. Bowel sounds are normal.  exhibits no distension. There is no tenderness.  Neurological: alert and oriented to person, place, and time. Left arm and leg weakness Skin: Skin is warm and dry. No rash noted. No erythema.  Psychiatric: a normal mood and affect.  behavior is normal.    LABS: Results for orders placed or performed during the hospital encounter of 10/20/19 (from the past 48 hour(s))  Glucose, capillary     Status: Abnormal   Collection Time: 10/21/19  6:07 PM  Result Value Ref Range   Glucose-Capillary 154 (H) 70 - 99 mg/dL   Comment 1 Notify RN    Comment 2 Document in Chart   Glucose, capillary     Status: Abnormal   Collection Time: 10/21/19  9:22 PM  Result Value Ref Range   Glucose-Capillary 142 (H) 70 - 99 mg/dL   Comment 1 Notify RN   Glucose, capillary     Status: Abnormal   Collection Time: 10/22/19  6:01 AM  Result Value Ref Range   Glucose-Capillary 143 (H) 70 - 99 mg/dL   Comment 1 Notify RN   Renal function panel     Status: Abnormal   Collection Time: 10/22/19  7:35 AM  Result Value Ref Range   Sodium 138 135 - 145 mmol/L   Potassium 4.8 3.5 - 5.1 mmol/L   Chloride 98 98 - 111 mmol/L   CO2 22 22 - 32 mmol/L   Glucose, Bld 148 (H) 70 - 99 mg/dL   BUN 74 (H) 8 - 23 mg/dL   Creatinine, Ser 10.27 (H) 0.44 - 1.00 mg/dL   Calcium 8.5 (L) 8.9 - 10.3 mg/dL   Phosphorus 4.3 2.5 - 4.6 mg/dL   Albumin 2.3 (L) 3.5 - 5.0 g/dL   GFR calc non Af Amer 3 (L) >60 mL/min   GFR calc Af Amer 4 (L) >60 mL/min   Anion gap 18 (H) 5 -  15    Comment: Performed at [facility], [address] [place], [place] [address]  CBC     Status: Abnormal   Collection Time: 10/22/19  7:35 AM  Result Value Ref Range   WBC 13.3 (H) 4.0 - 10.5 K/uL   RBC 3.40 (L) 3.87 - 5.11 MIL/uL   Hemoglobin 9.6 (L) 12.0 - 15.0 g/dL   HCT 30.0 (L) 36.0 - 46.0 %   MCV 88.2 80.0 - 100.0 fL   MCH 28.2 26.0 - 34.0 pg   MCHC 32.0 30.0 - 36.0 g/dL   RDW 17.5 (H) 11.5 - 15.5 %   Platelets 274 150 - 400 K/uL   nRBC 0.0 0.0 - 0.2 %  Comment: Performed at [facility], [place] [address] [place], [place] [address]  Culture, blood (routine x 2)     Status: None (Preliminary result)   Collection Time: 10/22/19  7:35 AM   Specimen: BLOOD  Result Value Ref Range   Specimen Description BLOOD RIGHT ANTECUBITAL    Special Requests      BOTTLES DRAWN AEROBIC ONLY Blood Culture adequate volume   Culture  Setup Time      GRAM NEGATIVE RODS AEROBIC BOTTLE ONLY CRITICAL VALUE NOTED.  VALUE IS CONSISTENT WITH PREVIOUSLY REPORTED AND CALLED VALUE. Performed at [facility], [place] [address], [place], [place] [address]    Culture GRAM NEGATIVE RODS    Report Status PENDING   Culture, blood (routine x 2)     Status: None (Preliminary result)   Collection Time: 10/22/19  7:43 AM   Specimen: BLOOD LEFT HAND  Result Value Ref Range   Specimen Description BLOOD LEFT HAND    Special Requests      BOTTLES DRAWN AEROBIC ONLY Blood Culture adequate volume   Culture  Setup Time      GRAM NEGATIVE RODS AEROBIC BOTTLE ONLY CRITICAL RESULT CALLED TO, READ BACK BY AND VERIFIED WITH: PHARMD V [place] [id number] AT 725 AM BY CM Performed at [facility], [place] [address], [place], [place] [address]    Culture GRAM NEGATIVE RODS    Report Status PENDING   Blood Culture ID Panel (Reflexed)     Status: Abnormal   Collection Time: 10/22/19  7:43 AM  Result Value Ref Range   Enterococcus species NOT DETECTED NOT DETECTED   Listeria monocytogenes NOT DETECTED  NOT DETECTED   Staphylococcus species NOT DETECTED NOT DETECTED   Staphylococcus aureus (BCID) NOT DETECTED NOT DETECTED   Streptococcus species NOT DETECTED NOT DETECTED   Streptococcus agalactiae NOT DETECTED NOT DETECTED   Streptococcus pneumoniae NOT DETECTED NOT DETECTED   Streptococcus pyogenes NOT DETECTED NOT DETECTED   Acinetobacter baumannii NOT DETECTED NOT DETECTED   Enterobacteriaceae species NOT DETECTED NOT DETECTED   Enterobacter cloacae complex NOT DETECTED NOT DETECTED   Escherichia coli NOT DETECTED NOT DETECTED   Klebsiella oxytoca NOT DETECTED NOT DETECTED   Klebsiella pneumoniae NOT DETECTED NOT DETECTED   Proteus species NOT DETECTED NOT DETECTED   Serratia marcescens NOT DETECTED NOT DETECTED   Carbapenem resistance NOT DETECTED NOT DETECTED   Haemophilus influenzae NOT DETECTED NOT DETECTED   Neisseria meningitidis NOT DETECTED NOT DETECTED   Pseudomonas aeruginosa DETECTED (A) NOT DETECTED    Comment: CRITICAL RESULT CALLED TO, READ BACK BY AND VERIFIED WITH: PHARMD V BRY[place] [id number] AT 725 AM BY CM    Candida albicans NOT DETECTED NOT DETECTED   Candida glabrata NOT DETECTED NOT DETECTED   Candida krusei NOT DETECTED NOT DETECTED   Candida parapsilosis NOT DETECTED NOT DETECTED   Candida tropicalis NOT DETECTED NOT DETECTED    Comment: Performed at [facility], [place] [address] [place], [place] [address]  Glucose, capillary     Status: None   Collection Time: 10/22/19 11:40 AM  Result Value Ref Range   Glucose-Capillary 81 70 - 99 mg/dL  Culture, blood (routine x 2)     Status: None (Preliminary result)   Collection Time: 10/22/19  5:03 PM   Specimen: BLOOD LEFT HAND  Result Value Ref Range   Specimen Description BLOOD LEFT HAND    Special Requests      BOTTLES DRAWN AEROBIC AND  ANAEROBIC Blood Culture adequate volume   Culture      NO GROWTH < 24 HOURS Performed at [facility] [address] [place], [place] [address]    Report Status  PENDING   Culture, blood (routine x 2)     Status: None (Preliminary result)   Collection Time: 10/22/19  5:08 PM   Specimen: BLOOD LEFT ARM  Result Value Ref Range   Specimen Description BLOOD LEFT ARM    Special Requests      BOTTLES DRAWN AEROBIC ONLY Blood Culture results may not be optimal due to an inadequate volume of blood received in culture bottles   Culture      NO GROWTH < 24 HOURS Performed at [facility] [address], [place], [place] [address]    Report Status PENDING   Glucose, capillary     Status: Abnormal   Collection Time: 10/22/19  5:48 PM  Result Value Ref Range   Glucose-Capillary 209 (H) 70 - 99 mg/dL   Comment 1 Notify RN    Comment 2 Document in Chart   Glucose, capillary     Status: Abnormal   Collection Time: 10/22/19  9:46 PM  Result Value Ref Range   Glucose-Capillary 198 (H) 70 - 99 mg/dL   Comment 1 Notify RN   Heparin level (unfractionated)     Status: None   Collection Time: 10/22/19 10:28 PM  Result Value Ref Range   Heparin Unfractionated 0.33 0.30 - 0.70 IU/mL    Comment: (NOTE) If heparin results are below expected values, and patient dosage has  been confirmed, suggest follow up testing of antithrombin III levels. Performed at [facility], [place] [address], [place], [place] [address]   Glucose, capillary     Status: Abnormal   Collection Time: 10/23/19  6:26 AM  Result Value Ref Range   Glucose-Capillary 178 (H) 70 - 99 mg/dL   Comment 1 Notify RN   Renal function panel     Status: Abnormal   Collection Time: 10/23/19  6:40 AM  Result Value Ref Range   Sodium 136 135 - 145 mmol/L   Potassium 4.8 3.5 - 5.1 mmol/L   Chloride 98 98 - 111 mmol/L   CO2 22 22 - 32 mmol/L   Glucose, Bld 174 (H) 70 - 99 mg/dL   BUN 87 (H) 8 - 23 mg/dL   Creatinine, Ser 12.23 (H) 0.44 - 1.00 mg/dL   Calcium 8.1 (L) 8.9 - 10.3 mg/dL   Phosphorus 4.9 (H) 2.5 - 4.6 mg/dL   Albumin 1.9 (L) 3.5 - 5.0 g/dL   GFR calc non Af Amer 3 (L) >60  mL/min   GFR calc Af Amer 3 (L) >60 mL/min   Anion gap 16 (H) 5 - 15    Comment: Performed at [facility] [address] [place], [place] [address]  CBC     Status: Abnormal   Collection Time: 10/23/19  6:40 AM  Result Value Ref Range   WBC 11.1 (H) 4.0 - 10.5 K/uL   RBC 2.90 (L) 3.87 - 5.11 MIL/uL   Hemoglobin 8.1 (L) 12.0 - 15.0 g/dL   HCT 25.5 (L) 36.0 - 46.0 %   MCV 87.9 80.0 - 100.0 fL   MCH 27.9 26.0 - 34.0 pg   MCHC 31.8 30.0 - 36.0 g/dL   RDW 17.5 (H) 11.5 - 15.5 %   Platelets 210 150 - 400 K/uL   nRBC 0.0 0.0 -  0.2 %    Comment: Performed at [facility], [place] [address], [place], [place] [address]  Heparin level (unfractionated)     Status: Abnormal   Collection Time: 10/23/19  6:40 AM  Result Value Ref Range   Heparin Unfractionated 0.24 (L) 0.30 - 0.70 IU/mL    Comment: (NOTE) If heparin results are below expected values, and patient dosage has  been confirmed, suggest follow up testing of antithrombin III levels. Performed at [facility], [place] [address], [place], [place] [address]   Hepatitis B surface antigen     Status: None   Collection Time: 10/23/19  9:06 AM  Result Value Ref Range   Hepatitis B Surface Ag NON REACTIVE NON REACTIVE    Comment: Performed at [facility] [address], [place], [place] [address]  Hepatitis B core antibody, total     Status: None   Collection Time: 10/23/19  9:06 AM  Result Value Ref Range   Hep B Core Total Ab NON REACTIVE NON REACTIVE    Comment: Performed at [facility] [address], [place], [place] [address]    MICRO: 11/29: PsA bacteremia IMAGING: Vas [name] Transcranial Doppler W Bubbles  Result Date: 10/22/2019  Transcranial Doppler with Bubble Indications: Stroke. Comparison Study: No prior study. Performing Technologist: Maudry Mayhew MHA, RDMS, RVT, RDCS  Examination Guidelines: A complete evaluation includes B-mode imaging, spectral Doppler, color Doppler, and power  Doppler as needed of all accessible portions of each vessel. Bilateral testing is considered an integral part of a complete examination. Limited examinations for reoccurring indications may be performed as noted.  Summary:  A vascular evaluation was performed. The right ICA siphon was studied. An IV was inserted into the patient's right forearm. Verbal informed consent was obtained.  No obvious evidence of high intensity transient signals (HITS), therefore no evidence of clinically significant patent foramen ovale (PFO). *See table(s) above for measurements and observations.    Preliminary     Assessment/Plan:  68yo F with ESRD with temp HD line admitted for left sided weakness found to have new acute right basal ganglia infarct, in the setting of recurrent pseudomonal bacteremia. Unclear if bacteremia only related to presence of foreign material/HD line vs. Having endocarditis   recommend to get TEE as part of stroke work up to see if evidence of endocarditis-though it is not common to have gram negative endocarditis in non-IVDU population  She is found to have right IJ thrombus- consider to treat as septic thrombophlebitis- with prolonged course of 6 wks. Please repeat blood cx after removal of temp HD line today  More recs to follow .

## 2019-10-24 ENCOUNTER — Encounter (HOSPITAL_COMMUNITY): Payer: Self-pay | Admitting: Student

## 2019-10-24 LAB — GLUCOSE, CAPILLARY
Glucose-Capillary: 96 mg/dL (ref 70–99)
Glucose-Capillary: 100 mg/dL — ABNORMAL HIGH (ref 70–99)
Glucose-Capillary: 142 mg/dL — ABNORMAL HIGH (ref 70–99)
Glucose-Capillary: 188 mg/dL — ABNORMAL HIGH (ref 70–99)

## 2019-10-24 LAB — RENAL FUNCTION PANEL
Albumin: 1.8 g/dL — ABNORMAL LOW (ref 3.5–5.0)
Anion gap: 14 (ref 5–15)
BUN: 40 mg/dL — ABNORMAL HIGH (ref 8–23)
CO2: 24 mmol/L (ref 22–32)
Calcium: 8.2 mg/dL — ABNORMAL LOW (ref 8.9–10.3)
Chloride: 98 mmol/L (ref 98–111)
Creatinine, Ser: 7.33 mg/dL — ABNORMAL HIGH (ref 0.44–1.00)
GFR calc Af Amer: 6 mL/min — ABNORMAL LOW (ref >60)
GFR calc non Af Amer: 5 mL/min — ABNORMAL LOW (ref >60)
Glucose, Bld: 104 mg/dL — ABNORMAL HIGH (ref 70–99)
Phosphorus: 4.9 mg/dL — ABNORMAL HIGH (ref 2.5–4.6)
Potassium: 4.6 mmol/L (ref 3.5–5.1)
Sodium: 136 mmol/L (ref 135–145)

## 2019-10-24 LAB — CBC
HCT: 26.5 % — ABNORMAL LOW (ref 36.0–46.0)
Hemoglobin: 8.4 g/dL — ABNORMAL LOW (ref 12.0–15.0)
MCH: 28.3 pg (ref 26.0–34.0)
MCHC: 31.7 g/dL (ref 30.0–36.0)
MCV: 89.2 fL (ref 80.0–100.0)
Platelets: 215 10*3/uL (ref 150–400)
RBC: 2.97 MIL/uL — ABNORMAL LOW (ref 3.87–5.11)
RDW: 17.6 % — ABNORMAL HIGH (ref 11.5–15.5)
WBC: 10.6 10*3/uL — ABNORMAL HIGH (ref 4.0–10.5)
nRBC: 0 % (ref 0.0–0.2)

## 2019-10-24 LAB — HEPARIN LEVEL (UNFRACTIONATED)
Heparin Unfractionated: 0.2 IU/mL — ABNORMAL LOW (ref 0.30–0.70)
Heparin Unfractionated: 0.32 IU/mL (ref 0.30–0.70)
Heparin Unfractionated: 0.26 IU/mL — ABNORMAL LOW (ref 0.30–0.70)

## 2019-10-24 LAB — HEPATITIS B SURFACE ANTIBODY, QUANTITATIVE: Hep B S AB Quant (Post): 217.7 m[IU]/mL (ref >9.9)

## 2019-10-24 MED ORDER — ONDANSETRON HCL 4 MG/2ML IJ SOLN
4.0000 mg | Freq: Four times a day (QID) | INTRAMUSCULAR | Status: DC | PRN
  Administered 2019-10-24 – 2019-10-31 (×2): 4 mg via INTRAVENOUS
  Filled 2019-10-24: qty 2

## 2019-10-24 MED ORDER — ACETAMINOPHEN 325 MG PO TABS
650.0000 mg | ORAL_TABLET | Freq: Once | ORAL | Status: AC
  Administered 2019-10-24: 650 mg via ORAL
  Filled 2019-10-24: qty 2

## 2019-10-24 MED ORDER — OXYCODONE HCL 5 MG PO TABS
5.0000 mg | ORAL_TABLET | Freq: Four times a day (QID) | ORAL | Status: DC | PRN
  Administered 2019-10-25 – 2019-11-01 (×6): 5 mg via ORAL
  Filled 2019-10-24 (×6): qty 1

## 2019-10-24 NOTE — Progress Notes (Addendum)
Initial Nutrition Assessment  DOCUMENTATION CODES:   Not applicable  INTERVENTION:  Ensure Enlive BID (each supplement provides 350 calories and 20 grams of protein)  Continue Rena-vite Daily  Recommend modifying TUMS regimen to be given with all meals and snacks as phosphate binder  NUTRITION DIAGNOSIS:   Increased nutrient needs related to chronic illness(ESRD) as evidenced by estimated needs.  GOAL:   Patient will meet greater than or equal to 90% of their needs  MONITOR:   PO intake, Supplement acceptance, Labs, Weight trends, Skin, I & O's  REASON FOR ASSESSMENT:   Diagnosis    ASSESSMENT:   68 y.o. female with medical history of ESRD (MWF), diabetes mellitus type 2, GERD, stroke, hypertension, hyperlipidemia admitted for acute ischemic stroke.  Pt and son present at time of assessment. Pt reports one instance of emesis overnight. States that she has a good appetite and ate 75% of her lunch. Denies any further N/V. Pt eating 25-60% of meals since admission with an average of 37%.   Pt reports having a good appetite PTA. She lives with her son at home and typical meal pattern includes Breakfast: grits; Lunch: hardees or mcdonalds; Dinner: subway.   Pt's son states that she has Glucerna supplements at home and is "supposed to drink two per day, but sometimes doesn't even drink any."   Pt states that she takes TUMS at home with her meals, and was "told to by my doctor." When asked if this was as a phosphate binder, she responded affirmatively with a head nod.   Pt is +3.3kg from EDW of 62.5kg, and per documented weight history is wt stable.   Labs reviewed include: CBG 104-174; A1c 7.6; Creat: 7.33; Ca 8.2 (adjusted=9.96); phos 4.9 Medications reviewed include: TUMS; Hectorol; insulin 0-9 Units; Rena-Vite; Heparin  NUTRITION - FOCUSED PHYSICAL EXAM:    Most Recent Value  Orbital Region  No depletion  Upper Arm Region  No depletion  Thoracic and Lumbar Region  No  depletion  Buccal Region  No depletion  Temple Region  No depletion  Clavicle Bone Region  No depletion  Clavicle and Acromion Bone Region  No depletion  Scapular Bone Region  No depletion  Dorsal Hand  Mild depletion  Patellar Region  Moderate depletion  Anterior Thigh Region  Moderate depletion  Posterior Calf Region  Moderate depletion  Edema (RD Assessment)  None  Hair  Reviewed  Eyes  Reviewed  Mouth  Reviewed  Skin  Reviewed  Nails  Reviewed       Diet Order:   Diet Order            Diet renal with fluid restriction Fluid restriction: 1200 mL Fluid; Room service appropriate? Yes; Fluid consistency: Thin  Diet effective now              EDUCATION NEEDS:   Education needs have been addressed  Skin:  Skin Assessment: Skin Integrity Issues: Skin Integrity Issues:: Incisions Incisions: R upper chest  Last BM:  11/30  Height:   Ht Readings from Last 1 Encounters:  10/20/19 5\' 7"  (1.702 m)    Weight:   Wt Readings from Last 1 Encounters:  10/24/19 65.8 kg    Ideal Body Weight:  61.4 kg  BMI:  Body mass index is 22.72 kg/m.  Estimated Nutritional Needs:   Kcal:  1600-1800  Protein:  80-95 grams  Fluid:  </= 1.2 L  Meda Klinefelter, Dietetic Intern

## 2019-10-24 NOTE — Progress Notes (Addendum)
PROGRESS NOTE    [name]  [id number] DOB: 13-Mar-1951 DOA: 10/20/2019 PCP: Manon Hilding, MD  Brief Narrative: [name] is a 68 y.o. female with medical history of ESRD (MWF), diabetes mellitus type 2, stroke, hypertension, hyperlipidemia presented with left-sided weakness,  when she woke up to get ready for dialysis at 4 AM on 10/20/2019, the patient noted left-sided weakness and had difficulty ambulating -MRI noted right basal ganglia infarcts -Hospitalization complicated by fevers of 101, and finding of  right IJ DVT on Dopplers -now with recurrent Pseudomonas bacteremia   Assessment & Plan:    Acute ischemic stroke -MRI notes right basal ganglia infarcts, continues to have significant left-sided deficits -2D echocardiogram with preserved EF, no cardiac source of embolus -Carotid duplex is unremarkable, no PFO noted on TCD bubble study -Patient was on aspirin and Plavix at baseline which was continued however given concomitant new finding of acute DVT in her right IJ she was started on IV heparin without bolus, after discussion with neurology Dr. Erlinda Hong -Now given bacteremia, endocarditis may be a consideration as well, requested TEE, per [facility], planned for Thursday 12/3 -Aspirin and Plavix discontinued 11/29 -Hemoglobin A1c is 7.6, LDL is 65 -Neurology consulting -PT OT, SLP evaluation completed, CIR recommended -CIR consulted  Recurrent Pseudomonas bacteremia -Likely secondary to HD catheter, previously treated with 6-week course of IV Tressie Ellis which was completed 10/15, previous HD catheter was removed and she had a new right IJ tunneled catheter placed then -After discussion with nephrology and infectious disease, HD catheter was removed 11/30 in IR for line holiday, repeat blood cultures today -Appreciate infectious disease consult -Continue IV cefepime day 2 -Discussed with [facility] for TEE, plan for Thursday 12/3 -Patient also complains of pain and  discomfort in her left hip since yesterday, will obtain an MRI of this area given recurrent bacteremia -Back in the end of August when she had Pseudomonas bacteremia and there was concern for possible discitis of L3-L4, this was later felt to be renal spondyloarthropathy on repeat imaging  Right IJ acute DVT -This is secondary to her HD catheter, discussed with renal, given concurrent bacteremia will need HD catheter removal -Treat with anticoagulation for 3 months after catheter removal, given ESRD options are limited,continue IV heparin without bolus and aim for a lower PTT, hold off on Coumadin until new dialysis access issues sorted out  ESRD -Nephrology consulted, usually dialyzes Monday Wednesday Friday -Right IJ HD catheter removed 11/30, last HD was yesterday 11/30  Diabetes mellitus type 2 -Hemoglobin A1c is 7.6 -NovoLog sliding scale  Essential hypertension -Restart Coreg, amlodipine on hold -hydralazine prn SBP >220  Anemia of chronic disease -Per renal  DVT prophylaxis: Heparin IV Code Status: DNR, discussed CODE STATUS with the patient she wishes to be DNR this was confirmed again today Family Communication: no family at bedside, called and d/w son [name] Disposition Plan: Possibly CIR when above work-up completed  Consultants:   Neurology, renal  -Infectious disease   Procedures: Right IJ HD catheter removed 11/30  Antimicrobials:    Subjective: -Feels better today, complains of left hip pain since yesterday -Fevers are better, no nausea or vomiting, tolerated HD catheter removal and dialysis yesterday  Objective: Vitals:   10/24/19 0358 10/24/19 0539 10/24/19 0800 10/24/19 1118  BP: 133/76     Pulse: 90     Resp: 17     Temp: 98.9 F (37.2 C)  99.5 F (37.5 C) 98.8 F (37.1 C)  TempSrc: Oral  Oral Oral  SpO2: 97%     Weight:  65.8 kg    Height:        Intake/Output Summary (Last 24 hours) at 10/24/2019 1130 Last data filed at 10/24/2019 0356  Gross per 24 hour  Intake 234.97 ml  Output 1500 ml  Net -1265.03 ml   Filed Weights   10/23/19 0850 10/23/19 1227 10/24/19 0539  Weight: 63.3 kg 63 kg 65.8 kg    Examination: Gen: chronically ill-appearing female appears older than stated age, AAO x3, no distress HEENT: PERRLA, Neck supple, no JVD, right IJ HD cath removed Lungs: Decreased breath sounds bases CVS: RRR,No Gallops,Rubs or new Murmurs Abd: soft, Non tender, non distended, BS present Extremities: No edema, right hip with mild discomfort and tenderness with full range of motion Skin: no new rashes Central nervous system: Awake and alert, oriented x3, left upper and lower extremity is 3/5  Psychiatry: Insight and judgment appear normal    Data Reviewed:   CBC: Recent Labs  Lab 10/20/19 0659 10/21/19 0405 10/22/19 0735 10/23/19 0640 10/24/19 0419  WBC 15.7* 10.9* 13.3* 11.1* 10.6*  NEUTROABS 12.4*  --   --   --   --   HGB 9.7* 8.6* 9.6* 8.1* 8.4*  HCT 32.8* 27.4* 30.0* 25.5* 26.5*  MCV 91.6 88.4 88.2 87.9 89.2  PLT 348 274 274 210 [id number]   Basic Metabolic Panel: Recent Labs  Lab 10/20/19 0659 10/21/19 0405 10/22/19 0735 10/23/19 0640 10/24/19 0419  NA 139 140 138 136 136  K 4.4 4.9 4.8 4.8 4.6  CL 97* 100 98 98 98  CO2 23 24 22 22 24   GLUCOSE 139* 131* 148* 174* 104*  BUN 56* 62* 74* 87* 40*  CREATININE 7.76* 8.86* 10.27* 12.23* 7.33*  CALCIUM 8.7* 8.4* 8.5* 8.1* 8.2*  PHOS  --  4.4 4.3 4.9* 4.9*   GFR: Estimated Creatinine Clearance: 7 mL/min (A) (by C-G formula based on SCr of 7.33 mg/dL (H)). Liver Function Tests: Recent Labs  Lab 10/20/19 0659 10/21/19 0405 10/22/19 0735 10/23/19 0640 10/24/19 0419  AST 12*  --   --   --   --   ALT 16  --   --   --   --   ALKPHOS 75  --   --   --   --   BILITOT 0.7  --   --   --   --   PROT 7.9  --   --   --   --   ALBUMIN 2.8* 2.1* 2.3* 1.9* 1.8*   No results for input(s): LIPASE, AMYLASE in the last 168 hours. No results for input(s): AMMONIA  in the last 168 hours. Coagulation Profile: Recent Labs  Lab 10/20/19 0659  INR 1.0   Cardiac Enzymes: No results for input(s): CKTOTAL, CKMB, CKMBINDEX, TROPONINI in the last 168 hours. BNP (last 3 results) No results for input(s): PROBNP in the last 8760 hours. HbA1C: No results for input(s): HGBA1C in the last 72 hours. CBG: Recent Labs  Lab 10/23/19 0626 10/23/19 1518 10/23/19 2121 10/24/19 0611 10/24/19 1120  GLUCAP 178* 119* 158* 96 100*   Lipid Profile: No results for input(s): CHOL, HDL, LDLCALC, TRIG, CHOLHDL, LDLDIRECT in the last 72 hours. Thyroid Function Tests: No results for input(s): TSH, T4TOTAL, FREET4, T3FREE, THYROIDAB in the last 72 hours. Anemia Panel: No results for input(s): VITAMINB12, FOLATE, FERRITIN, TIBC, IRON, RETICCTPCT in the last 72 hours. Urine analysis:    Component Value Date/Time   COLORURINE  YELLOW 10/20/2019 0954   APPEARANCEUR HAZY (A) 10/20/2019 0954   LABSPEC 1.010 10/20/2019 0954   PHURINE 7.0 10/20/2019 0954   GLUCOSEU 50 (A) 10/20/2019 0954   HGBUR NEGATIVE 10/20/2019 [employer] 10/20/2019 0954   KETONESUR NEGATIVE 10/20/2019 0954   PROTEINUR 100 (A) 10/20/2019 0954   NITRITE NEGATIVE 10/20/2019 0954   LEUKOCYTESUR NEGATIVE 10/20/2019 0954   Sepsis Labs: @LABRCNTIP (procalcitonin:4,lacticidven:4)  ) Recent Results (from the past 240 hour(s))  SARS CORONAVIRUS 2 (TAT 6-24 HRS) Nasopharyngeal Nasopharyngeal Swab     Status: None   Collection Time: 10/20/19  7:30 AM   Specimen: Nasopharyngeal Swab  Result Value Ref Range Status   SARS Coronavirus 2 NEGATIVE NEGATIVE Final    Comment: (NOTE) SARS-CoV-2 target nucleic acids are NOT DETECTED. The SARS-CoV-2 RNA is generally detectable in upper and lower respiratory specimens during the acute phase of infection. Negative results do not preclude SARS-CoV-2 infection, do not rule out co-infections with other pathogens, and should not be used as the sole  basis for treatment or other patient management decisions. Negative results must be combined with clinical observations, patient history, and epidemiological information. The expected result is Negative. Fact Sheet for Patients: SugarRoll.be Fact Sheet for Healthcare Providers: https://www.woods-mathews.com/ This test is not yet approved or cleared by the [place] FDA and  has been authorized for detection and/or diagnosis of SARS-CoV-2 by FDA under an Emergency Use Authorization (EUA). This EUA will remain  in effect (meaning this test can be used) for the duration of the COVID-19 declaration under Section 56 4(b)(1) of the Act, 21 U.S.C. section 360bbb-3(b)(1), unless the authorization is terminated or revoked sooner. Performed at [facility], [place] [address] [place], [place] [address]   Culture, blood (routine x 2)     Status: Abnormal (Preliminary result)   Collection Time: 10/22/19  7:35 AM   Specimen: BLOOD  Result Value Ref Range Status   Specimen Description BLOOD RIGHT ANTECUBITAL  Final   Special Requests   Final    BOTTLES DRAWN AEROBIC ONLY Blood Culture adequate volume   Culture  Setup Time   Final    GRAM NEGATIVE RODS AEROBIC BOTTLE ONLY CRITICAL VALUE NOTED.  VALUE IS CONSISTENT WITH PREVIOUSLY REPORTED AND CALLED VALUE. Performed at [facility], [place] [address], [place], [place] [address]    Culture PSEUDOMONAS AERUGINOSA (A)  Final   Report Status PENDING  Incomplete  Culture, blood (routine x 2)     Status: Abnormal (Preliminary result)   Collection Time: 10/22/19  7:43 AM   Specimen: BLOOD LEFT HAND  Result Value Ref Range Status   Specimen Description BLOOD LEFT HAND  Final   Special Requests   Final    BOTTLES DRAWN AEROBIC ONLY Blood Culture adequate volume   Culture  Setup Time   Final    GRAM NEGATIVE RODS AEROBIC BOTTLE ONLY CRITICAL RESULT CALLED TO, READ BACK BY AND VERIFIED WITH: PHARMD  V BRYK [id number] AT 65 AM BY CM    Culture (A)  Final    PSEUDOMONAS AERUGINOSA SUSCEPTIBILITIES TO FOLLOW Performed at [facility], [place] [address], [place], [place] [address]    Report Status PENDING  Incomplete  Blood Culture ID Panel (Reflexed)     Status: Abnormal   Collection Time: 10/22/19  7:43 AM  Result Value Ref Range Status   Enterococcus species NOT DETECTED NOT DETECTED Final   Listeria monocytogenes NOT DETECTED NOT DETECTED Final   Staphylococcus  species NOT DETECTED NOT DETECTED Final   Staphylococcus aureus (BCID) NOT DETECTED NOT DETECTED Final   Streptococcus species NOT DETECTED NOT DETECTED Final   Streptococcus agalactiae NOT DETECTED NOT DETECTED Final   Streptococcus pneumoniae NOT DETECTED NOT DETECTED Final   Streptococcus pyogenes NOT DETECTED NOT DETECTED Final   Acinetobacter baumannii NOT DETECTED NOT DETECTED Final   Enterobacteriaceae species NOT DETECTED NOT DETECTED Final   Enterobacter cloacae complex NOT DETECTED NOT DETECTED Final   Escherichia coli NOT DETECTED NOT DETECTED Final   Klebsiella oxytoca NOT DETECTED NOT DETECTED Final   Klebsiella pneumoniae NOT DETECTED NOT DETECTED Final   Proteus species NOT DETECTED NOT DETECTED Final   Serratia marcescens NOT DETECTED NOT DETECTED Final   Carbapenem resistance NOT DETECTED NOT DETECTED Final   Haemophilus influenzae NOT DETECTED NOT DETECTED Final   Neisseria meningitidis NOT DETECTED NOT DETECTED Final   Pseudomonas aeruginosa DETECTED (A) NOT DETECTED Final    Comment: CRITICAL RESULT CALLED TO, READ BACK BY AND VERIFIED WITH: PHARMD V BRYK 113020 AT 725 AM BY CM    Candida albicans NOT DETECTED NOT DETECTED Final   Candida glabrata NOT DETECTED NOT DETECTED Final   Candida krusei NOT DETECTED NOT DETECTED Final   Candida parapsilosis NOT DETECTED NOT DETECTED Final   Candida tropicalis NOT DETECTED NOT DETECTED Final    Comment: Performed at [facility], [address] [place], [place] [address]  Culture, blood (routine x 2)     Status: Abnormal (Preliminary result)   Collection Time: 10/22/19  5:03 PM   Specimen: BLOOD LEFT HAND  Result Value Ref Range Status   Specimen Description BLOOD LEFT HAND  Final   Special Requests   Final    BOTTLES DRAWN AEROBIC AND ANAEROBIC Blood Culture adequate volume   Culture  Setup Time   Final    AEROBIC BOTTLE ONLY GRAM NEGATIVE RODS CRITICAL VALUE NOTED.  VALUE IS CONSISTENT WITH PREVIOUSLY REPORTED AND CALLED VALUE. Performed at [facility], [place] [address] [place], [place] [address]    Culture PSEUDOMONAS AERUGINOSA (A)  Final   Report Status PENDING  Incomplete  Culture, blood (routine x 2)     Status: Abnormal (Preliminary result)   Collection Time: 10/22/19  5:08 PM   Specimen: BLOOD LEFT ARM  Result Value Ref Range Status   Specimen Description BLOOD LEFT ARM  Final   Special Requests   Final    BOTTLES DRAWN AEROBIC ONLY Blood Culture results may not be optimal due to an inadequate volume of blood received in culture bottles   Culture  Setup Time   Final    AEROBIC BOTTLE ONLY GRAM NEGATIVE RODS CRITICAL VALUE NOTED.  VALUE IS CONSISTENT WITH PREVIOUSLY REPORTED AND CALLED VALUE. Performed at [facility], [place] [address], [place], [place] [address]    Culture PSEUDOMONAS AERUGINOSA (A)  Final   Report Status PENDING  Incomplete         Radiology Studies: Ir Removal Tun Cv Cath W/o Fl  Result Date: 10/24/2019 INDICATION: Patient with history of ESRD on HD s/p tunneled right IJ HD catheter placement in OR 07/25/2019 by Dr. Constance Haw. Patient currently admitted to [facility] and found to have recurrence Pseudomonas bacteremia. Request is made for removal of tunneled hemodialysis catheter for line holiday. EXAM: REMOVAL OF TUNNELED HEMODIALYSIS CATHETER MEDICATIONS: None COMPLICATIONS: None immediate. PROCEDURE: Informed written consent was obtained from the patient following an explanation of the  procedure, risks, benefits  and alternatives to treatment. A time out was performed prior to the initiation of the procedure. Maximal barrier sterile technique was utilized including mask, sterile gloves, large sterile drape, hand hygiene, and Hibiclens. Utilizing gentle traction, the catheter was removed intact. Hemostasis was obtained with manual compression. A dressing was placed. The patient tolerated the procedure well without immediate post procedural complication. IMPRESSION: Successful removal of tunneled dialysis catheter. Read by: Earley Abide, PA-C Electronically Signed   By: Lucrezia Europe M.D.   On: 10/23/2019 16:01   Vas [name] Transcranial Doppler W Bubbles  Result Date: 10/24/2019  Transcranial Doppler with Bubble Indications: Stroke. Comparison Study: No prior study. Performing Technologist: Maudry Mayhew MHA, RDMS, RVT, RDCS  Examination Guidelines: A complete evaluation includes B-mode imaging, spectral Doppler, color Doppler, and power Doppler as needed of all accessible portions of each vessel. Bilateral testing is considered an integral part of a complete examination. Limited examinations for reoccurring indications may be performed as noted.  Summary:  A vascular evaluation was performed. The right ICA siphon was studied. An IV was inserted into the patient's right forearm. Verbal informed consent was obtained.  No obvious evidence of high intensity transient signals (HITS), therefore no evidence of clinically significant patent foramen ovale (PFO). Negative TCD Bubble study *See table(s) above for measurements and observations.  Diagnosing physician: Antony Contras MD Electronically signed by Antony Contras MD on 10/24/2019 at 8:15:53 AM.    Final         Scheduled Meds: . acetaminophen  1,000 mg Oral Once  . calcium carbonate  1 tablet Oral 2 times per day  . carvedilol  6.25 mg Oral BID  . Chlorhexidine Gluconate Cloth  6 each Topical Daily  . Chlorhexidine Gluconate Cloth  6 each  Topical Q0600  . doxercalciferol  1.5 mcg Intravenous Q M,W,F-HD  . gabapentin  100 mg Oral QHS  . insulin aspart  0-5 Units Subcutaneous QHS  . insulin aspart  0-9 Units Subcutaneous TID WC  . isosorbide mononitrate  30 mg Oral QPC lunch  . multivitamin  1 tablet Oral QPC lunch   Continuous Infusions: . sodium chloride Stopped (10/20/19 1317)  . ceFEPime (MAXIPIME) IV    . heparin 1,050 Units/hr (10/24/19 0520)     LOS: 4 days    Time spent: 39min  Domenic Polite, MD [facility]  10/24/2019, 11:30 AM

## 2019-10-24 NOTE — Progress Notes (Signed)
Orthopedic Tech Progress Note Patient Details:  [name] Sep 20, 1951 [id number] Applied a TLSO brace to patient while in hair. Patient ID: [name], female   DOB: May 17, 1951, 68 y.o.   MRN: [id number]   [name] 10/24/2019, 6:38 PM

## 2019-10-24 NOTE — Progress Notes (Addendum)
Physical Therapy Treatment Patient Details Name: [name] MRN: [id number] DOB: 1951-05-27 Today's Date: 10/24/2019    History of Present Illness [name] is a 68 y.o. female with history of ESRD ( on dialysis M-W-F), TIA 2010, HTN, HLD, Ha, DM2 who presented to  [facility] with c/o dizziness, left arm weakness and left leg numbness. MRI revealed acute R basal ganglia infarct. In addition, she has R IJ DVT and spiked a fever 11/27 with concern for sepsis.     PT Comments    Pt supine in bed on arrival and agreeable to mobilize with PTA.  She is making progress but it remains slow as her pain in her low back is not managed.  Spoke with MD post session and plans in place to adjust pain meds and get TLSO for comfort when mobilizing.  Pt is currently requiring moderate assistance to come to edge of bed and achieve standing.  Plan remains for SNF at this time but if pain is better managed then she could progress to CIR.      Follow Up Recommendations  SNF(if pain improves will consider CIR, at this time pain is poorly managed and she is unable to tolerate CIR.)     Equipment Recommendations  None recommended by PT;Other (comment)(TBD)    Recommendations for Other Services Rehab consult     Precautions / Restrictions Precautions Precautions: Fall Restrictions Weight Bearing Restrictions: No    Mobility  Bed Mobility Overal bed mobility: Needs Assistance Bed Mobility: Sidelying to Sit;Rolling Rolling: Min assist Sidelying to sit: Mod assist    General bed mobility comments: Pt able to roll into sidlying with min assistance and cues for hand placement.  She is slow and guarded due to pain and rolled back several times due to pain.  Pt once in sidelying required moderate assistance to elevate trunk into sitting.  She presents with forward flexed kyphotic posturing.  Transfers Overall transfer level: Needs assistance Equipment used: Ambulation equipment  used(sara stedy) Transfers: Sit to/from Stand Sit to Stand: Mod assist         General transfer comment: Pt performed sit to stand from bed into sara stedy frame x2 trials.  She continues to present with flexed posture in standing.  Pt only able to tolerate standing for 3-5 seconds due to pain.  She required cues for hand placement and moderate assistance to achieve standing and to lower into sitting.  Ambulation/Gait             General Gait Details: unable   Stairs             Wheelchair Mobility    Modified Rankin (Stroke Patients Only) Modified Rankin (Stroke Patients Only) Pre-Morbid Rankin Score: No symptoms Modified Rankin: Severe disability     Balance Overall balance assessment: Needs assistance Sitting-balance support: Bilateral upper extremity supported;Feet supported Sitting balance-Leahy Scale: Poor Sitting balance - Comments: required maxA to maintain upright sitting balance, with with L lateral lean and attempting to place head on therapist's shoulder Postural control: (anterior lean due to pain.)   Standing balance-Leahy Scale: Poor                              Cognition Arousal/Alertness: Awake/alert Behavior During Therapy: WFL for tasks assessed/performed Overall Cognitive Status: No family/caregiver present to determine baseline cognitive functioning  General Comments: lethargic and question accuracy of her history      Exercises      General Comments General comments (skin integrity, edema, etc.): pt's son present during session;son and pt really want pt to go to CIR      Pertinent Vitals/Pain Pain Assessment: 0-10 Faces Pain Scale: Hurts even more Pain Location: left hip Pain Descriptors / Indicators: Aching;Grimacing;Guarding Pain Intervention(s): Monitored during session;Repositioned    Home Living Family/patient expects to be discharged to:: Private residence Living  Arrangements: Other relatives Available Help at Discharge: Family Type of Home: House Home Access: Stairs to enter Entrance Stairs-Rails: Right;Left;Can reach both Home Layout: One level Home Equipment: Kasandra Knudsen - single point Additional Comments: pt states that she lives with her brother, chart states that she lives with her husband?    Prior Function Level of Independence: Independent      Comments: household and short distanced community ambulator, drives   PT Goals (current goals can now be found in the care plan section) Acute Rehab PT Goals Patient Stated Goal: to get back to her normal Potential to Achieve Goals: Good Progress towards PT goals: Progressing toward goals    Frequency    Min 4X/week      PT Plan Current plan remains appropriate    Co-evaluation              AM-PAC PT "6 Clicks" Mobility   Outcome Measure  Help needed turning from your back to your side while in a flat bed without using bedrails?: A Lot Help needed moving from lying on your back to sitting on the side of a flat bed without using bedrails?: A Lot Help needed moving to and from a bed to a chair (including a wheelchair)?: A Lot Help needed standing up from a chair using your arms (e.g., wheelchair or bedside chair)?: A Lot Help needed to walk in hospital room?: Total Help needed climbing 3-5 steps with a railing? : Total 6 Click Score: 10    End of Session Equipment Utilized During Treatment: Gait belt Activity Tolerance: Patient limited by pain Patient left: in bed;with bed alarm set;with call bell/phone within reach Nurse Communication: Mobility status;Patient requests pain meds PT Visit Diagnosis: Muscle weakness (generalized) (M62.81);Other abnormalities of gait and mobility (R26.89);Pain Pain - Right/Left: Left Pain - part of body: Hip     Time: [id number] PT Time Calculation (min) (ACUTE ONLY): 23 min  Charges:  $Therapeutic Activity: 23-37 mins                      Erasmo Leventhal , PTA Acute Rehabilitation Services Pager [phone] Office [phone]     Tywana Robotham Eli Hose 10/24/2019, 6:00 PM

## 2019-10-24 NOTE — Progress Notes (Signed)
ANTICOAGULATION CONSULT NOTE - Follow-Up  Pharmacy Consult for Heparin Indication: RIJ DVT  No Known Allergies  Patient Measurements: Height: 5\' 7"  (170.2 cm) Weight: 145 lb 1 oz (65.8 kg) IBW/kg (Calculated) : 61.6 Heparin Dosing Weight: 63 kg  Vital Signs: Temp: 98.8 F (37.1 C) (12/01 1118) Temp Source: Oral (12/01 1118) BP: 117/70 (12/01 1200) Pulse Rate: 80 (12/01 1200)  Labs: Recent Labs    10/22/19 0735  10/23/19 0640 10/23/19 1853 10/24/19 0419 10/24/19 1318  HGB 9.6*  --  8.1*  --  8.4*  --   HCT 30.0*  --  25.5*  --  26.5*  --   PLT 274  --  210  --  215  --   HEPARINUNFRC  --    < > 0.24* <0.10* 0.20* 0.32  CREATININE 10.27*  --  12.23*  --  7.33*  --    < > = values in this interval not displayed.    Estimated Creatinine Clearance: 7 mL/min (A) (by C-G formula based on SCr of 7.33 mg/dL (H)).  Assessment: 5 YOF with ESRD who presented on 11/27 with new CVA and is now found to have RIJ DVT and pharmacy consulted to start Heparin anticoagulation - low goal and no bolus with concurrent CVA.   Heparin level now therapeutic at 0.32. Hg low but stable, plt wnl. No active bleed issues documented.  Goal of Therapy:  Heparin level 0.3-0.5 units/ml Monitor platelets by anticoagulation protocol: Yes   Plan:  Continue heparin at 1050 units/hr 8h heparin level to confirm Monitor daily heparin level and CBC, s/sx bleeding   Elicia Lamp, PharmD, BCPS Please check AMION for all [place] contact numbers Clinical Pharmacist 10/24/2019 2:12 PM

## 2019-10-24 NOTE — Progress Notes (Addendum)
ANTICOAGULATION CONSULT NOTE - Follow Up Consult  Pharmacy Consult for heparin Indication: DVT  Labs: Recent Labs    10/22/19 0735  10/23/19 0640 10/23/19 1853 10/24/19 0419  HGB 9.6*  --  8.1*  --  8.4*  HCT 30.0*  --  25.5*  --  26.5*  PLT 274  --  210  --  215  HEPARINUNFRC  --    < > 0.24* <0.10* 0.20*  CREATININE 10.27*  --  12.23*  --   --    < > = values in this interval not displayed.    Assessment: 68yo female subtherapeutic on heparin after resumed; no gtt issues or signs of bleeding per RN.  Goal of Therapy:  Heparin level 0.3-0.5 units/ml   Plan:  Will increase heparin gtt by 2 units/kg/hr to 1050 units/hr and check level in 8 hours.    Wynona Neat, PharmD, BCPS  10/24/2019,5:17 AM

## 2019-10-24 NOTE — Progress Notes (Signed)
Patient ID: [name], female   DOB: 08-Aug-1951, 68 y.o.   MRN: [id number]  [facility] Progress Note    Subjective:    Feels better today.  TDC removed yesterday after HD.   Objective:   BP 117/70 (BP Location: Left Arm)   Pulse 80   Temp 98.8 F (37.1 C) (Oral)   Resp 11   Ht 5\' 7"  (1.702 m)   Wt 65.8 kg   SpO2 100%   BMI 22.72 kg/m   Intake/Output: I/O last 3 completed shifts: In: 335 [I.V.:235; IV Piggyback:100] Out: 1500 [Other:1500]   Intake/Output this shift:  Total I/O In: 50 [P.O.:50] Out: -  Weight change: 0 kg  Physical Exam: Gen: NAD CVS: no rub Resp: cta Abd: +BS, soft, NT/ND Ext: no edema  Labs: BMET Recent Labs  Lab 10/20/19 0659 10/21/19 0405 10/22/19 0735 10/23/19 0640 10/24/19 0419  NA 139 140 138 136 136  K 4.4 4.9 4.8 4.8 4.6  CL 97* 100 98 98 98  CO2 23 24 22 22 24   GLUCOSE 139* 131* 148* 174* 104*  BUN 56* 62* 74* 87* 40*  CREATININE 7.76* 8.86* 10.27* 12.23* 7.33*  ALBUMIN 2.8* 2.1* 2.3* 1.9* 1.8*  CALCIUM 8.7* 8.4* 8.5* 8.1* 8.2*  PHOS  --  4.4 4.3 4.9* 4.9*   CBC Recent Labs  Lab 10/20/19 0659 10/21/19 0405 10/22/19 0735 10/23/19 0640 10/24/19 0419  WBC 15.7* 10.9* 13.3* 11.1* 10.6*  NEUTROABS 12.4*  --   --   --   --   HGB 9.7* 8.6* 9.6* 8.1* 8.4*  HCT 32.8* 27.4* 30.0* 25.5* 26.5*  MCV 91.6 88.4 88.2 87.9 89.2  PLT 348 274 274 210 215    @IMGRELPRIORS @ Medications:    . acetaminophen  1,000 mg Oral Once  . calcium carbonate  1 tablet Oral 2 times per day  . carvedilol  6.25 mg Oral BID  . Chlorhexidine Gluconate Cloth  6 each Topical Daily  . Chlorhexidine Gluconate Cloth  6 each Topical Q0600  . doxercalciferol  1.5 mcg Intravenous Q M,W,F-HD  . gabapentin  100 mg Oral QHS  . insulin aspart  0-5 Units Subcutaneous QHS  . insulin aspart  0-9 Units Subcutaneous TID WC  . isosorbide mononitrate  30 mg Oral QPC lunch  . multivitamin  1 tablet Oral QPC lunch     Assessment/ Plan:    1. Recurrent Pseudomonas bacteremia- unclear source.  S/p removal of [facility] 10/23/19.  Appreciate ID consult who recommended TEE to r/o endocarditis.  Also has RIJ thrombus so septic thrombophlebitis also possible.  Recommended 6 weeks of abx and repeat culture today since HD cath removed.  Will wait for 24-48 hours to replace. 2. Acute ischemic CVA- MRI revealed R basal ganglia infarcts and she has left-sided deficits.  Neuro following.  CIR recommended. 3. ESRD normally MWF but may need to wait until Thursday for HD since [facility] removed for "line holiday" with recurrent pseudomonas bacteremia 4. Anemia: stable 5. CKD-MBD: cont with homemeds 6. Nutrition: renal diet 7. Hypertension: stable  Donetta Potts, MD [place] Pager [phone] 10/24/2019, 1:28 PM

## 2019-10-24 NOTE — Progress Notes (Signed)
STROKE TEAM PROGRESS NOTE   INTERVAL HISTORY Pt lying in bed, no complains.  Blood culture 4/4 pseudomonas. ID on board, her HD catheter was removed for line holiday. Put on cefepime. Continued on IV heparin. TEE recommended to rule out endocarditis. Still has fever 101  OBJECTIVE Vitals:   10/24/19 0539 10/24/19 0800 10/24/19 1118 10/24/19 1200  BP:    117/70  Pulse:    80  Resp:    11  Temp:  99.5 F (37.5 C) 98.8 F (37.1 C)   TempSrc:  Oral Oral   SpO2:    100%  Weight: 65.8 kg     Height:        CBC:  Recent Labs  Lab 10/20/19 0659  10/23/19 0640 10/24/19 0419  WBC 15.7*   < > 11.1* 10.6*  NEUTROABS 12.4*  --   --   --   HGB 9.7*   < > 8.1* 8.4*  HCT 32.8*   < > 25.5* 26.5*  MCV 91.6   < > 87.9 89.2  PLT 348   < > 210 215   < > = values in this interval not displayed.    Basic Metabolic Panel:  Recent Labs  Lab 10/23/19 0640 10/24/19 0419  NA 136 136  K 4.8 4.6  CL 98 98  CO2 22 24  GLUCOSE 174* 104*  BUN 87* 40*  CREATININE 12.23* 7.33*  CALCIUM 8.1* 8.2*  PHOS 4.9* 4.9*    Lipid Panel:     Component Value Date/Time   CHOL 150 10/21/2019 0405   TRIG 192 (H) 10/21/2019 0405   HDL 47 10/21/2019 0405   CHOLHDL 3.2 10/21/2019 0405   VLDL 38 10/21/2019 0405   LDLCALC 65 10/21/2019 0405   HgbA1c:  Lab Results  Component Value Date   HGBA1C 7.6 (H) 10/21/2019   Urine Drug Screen:     Component Value Date/Time   LABOPIA NONE DETECTED 07/19/2019 1220   COCAINSCRNUR NONE DETECTED 07/19/2019 1220   LABBENZ NONE DETECTED 07/19/2019 1220   AMPHETMU NONE DETECTED 07/19/2019 1220   THCU NONE DETECTED 07/19/2019 1220   LABBARB NONE DETECTED 07/19/2019 1220    Alcohol Level     Component Value Date/Time   ETH <10 07/19/2019 1405    IMAGING  Dg Chest 1 View 10/20/2019 IMPRESSION:  Possible trace left pleural effusion.   Ct Head Wo Contrast 10/20/2019 IMPRESSION:  1. Acute right basal ganglia infarcts.  2. Mild chronic small vessel  ischemic disease.   Mr Brain 25 Contrast Mr Angio Head Wo Contrast 10/20/2019 IMPRESSION:  1. Acute right basal ganglia infarcts.  2. Chronic ischemia with multiple old infarcts as above.  3. Negative head MRA.   TTE 10/20/2019 Impression 1. Left ventricular ejection fraction, by visual estimation, is 60 to 65%. The left ventricle has normal function. There is mildly increased left ventricular hypertrophy. 2. Left ventricular diastolic parameters are consistent with Grade I diastolic dysfunction (impaired relaxation). 3. Global right ventricle has normal systolic function.The right ventricular size is normal. 4. Right atrial size was normal. 5. Trivial pericardial effusion is present. 6. Mild mitral annular calcification. 7. The mitral valve is normal in structure. Trace mitral valve regurgitation. No evidence of mitral stenosis. 8. The tricuspid valve is normal in structure. Tricuspid valve regurgitation is mild. 9. The aortic valve is tricuspid. Aortic valve regurgitation is not visualized. Mild to moderate aortic valve sclerosis/calcification without any evidence of aortic stenosis. 10. The pulmonic valve was normal in structure. Pulmonic  valve regurgitation is not visualized. 11. The inferior vena cava is normal in size with greater than 50% respiratory variability, suggesting right atrial pressure of 3 mmHg. 12. Normal LV systolic function; grade 1 diastolic dysfunction; mild LVH; mild LAE.  Bilateral Carotid Dopplers  Right Carotid: Velocities in the right ICA are consistent with a 1-39% stenosis. Left Carotid: Velocities in the left ICA are consistent with a 1-39% stenosis. Vertebrals:  Bilateral vertebral arteries demonstrate antegrade flow. Subclavians: Normal flow hemodynamics were seen in bilateral subclavian arteries.  Bilateral Lower Extremity Venous Dopplers Right: There is no evidence of deep vein thrombosis in the lower extremity. Left: There is no evidence of deep  vein thrombosis in the lower extremity. However, portions of this examination were limited- see technologist comments above.  Bilateral upper Extremity Venous Dopplers Right: No evidence of superficial vein thrombosis in the upper extremity. Findings consistent with acute deep vein thrombosis involving the right internal jugular vein. Findings consistent with age indeterminate deep vein thrombosis involving the one of the paired veins of the right brachial veins. Left:  No evidence of thrombosis in the subclavian.  ECG - SR rate 83 BPM. (See cardiology reading for complete details)  Transcranial Dopplers 10/21/19 A vascular evaluation was performed. The right ICA siphon was studied. An IV was inserted into the patient's right forearm. Verbal informed consent was obtained. No obvious evidence of high intensity transient signals (HITS), therefore no evidence of clinically significant patent foramen ovale (PFO).  PHYSICAL EXAM   Temp:  [98.8 F (37.1 C)-99.7 F (37.6 C)] 98.8 F (37.1 C) (12/01 1118) Pulse Rate:  [80-112] 80 (12/01 1200) Resp:  [11-20] 11 (12/01 1200) BP: (113-178)/(70-95) 117/70 (12/01 1200) SpO2:  [97 %-100 %] 100 % (12/01 1200) Weight:  [65.8 kg] 65.8 kg (12/01 0539)  General - Well nourished, well developed, in no apparent distress, in HD.  Ophthalmologic - fundi not visualized due to noncooperation.  Cardiovascular - Regular rhythm and rate.  Neuro - awake alert, eyes open, mild abulia. Orientated to time place and people. No aphasia, following all simple commands, able to name and repeat. PERRL, visual field full, EOMI, no gaze preference. Mild left facial asymmetry, tongue midline. LUE 4/5 with pronator drift, finger grip 4/5. LLE proximal 3/5, knee flexion 4/5, foot DF and PF 4/5. Sensation symmetrical bilaterally, FTN intact bilaterally although slow on the left. DTR 1+ on the left and no babinski. Gait not tested.    ASSESSMENT/PLAN [name] is  a 68 y.o. female with history of ESRD ( on dialysis M-W-F), TIA 2010, HTN, HLD, Ha, DM2 who presented to [facility] with c/o dizziness, left arm weakness and left leg numbness. She did not receive IV t-PA due to late presentation (>4.5 hours from time of onset)  Stroke:  acute right BG, CR, caudate scattered infarcts, embolic pattern, source unclear  CT head - Acute right basal ganglia infarcts.   MRI head -  Acute right BG, CR, caudate scattered infarcts. Chronic ischemia with multiple old infarcts as above.   MRA head - Negative   Carotid Doppler unremarkable  2D Echo - EF 60 - 65%  Bilateral LE Venous Dopplers no DVT  Bilateral UE venous doppler - right IJ acute DVT, right brachial vein age-indetermined DVT  TCD bubble study - no PFO but lack of effort on valsalva.  Recommend TEE to rule out endocarditis  Recommend outpt 30 day cardiac event monitoring to rule out afib  LDL - 65  HgbA1c -  7.6  VTE prophylaxis - [place] Heparin  aspirin 81 mg daily and clopidogrel 75 mg daily prior to admission, now on IV heparin. May consider transition to oral AC once appropriate   Therapy recommendations:  CIR  Disposition:  Pending  Right IJ acute DVT  Found on UE venous Doppler  Could be related to dialysis catheter  On heparin IV - can consider transition to [facility] once appropriate  Treatment as per primary team  ESRD on HD  04/2019 right arm AV graft removed  Cre 7.76->8.86->10.27->12.23->7.33  MWF schedule  Nephrology on board  HD yesterday  Recurrent Pseudomonas Bacteremia  06/2019 -admitted for altered mental status, blood culture showed positive gram negative rods, confirmed for Pseudomonas infection, concerning for coming from left 3-4 discitis/osteomyelitis.  Treated with cefepime initially and then transition to [facility] for 6 weeks.  08/2019 blood culture repeat negative.  10/22/19 1/4 blood culture - pseudomonas 4/4 BCx- on cefepime now  Recommend  TEE to rule out endocarditis.  Hypertension  Home BP meds: Coreg ; Norvasc ; Imdur  Continue Imdur  Resumed coreg  BP stable  Long-term BP goal normotensive  Diabetes type II  Home diabetic meds: insulin  Current diabetic meds: insulin  HgbA1c 7.6, goal < 7.0  SSI  CBG monitoring  Close PCP follow-up  Fever   Tmax 101.9->100.7->afebrile->101  WBC 15.7->10.9->13.3 ->11.1->10.6  blood cultures 4/4 pseudomonas - on cefepime  UA WBC 6-10  Not on Abx yet  Other Stroke Risk Factors  Advanced age  Hx TIA in 2010  Other Active Problems  Anemia due to ESRD - Hb - 9.7->8.6->9.6->8.1->8.4  Hospital day # 4  Rosalin Hawking, MD PhD Stroke Neurology 10/24/2019 2:44 PM   To contact Stroke Continuity provider, please refer to [email or web address] After hours, contact General Neurology

## 2019-10-24 NOTE — Progress Notes (Signed)
ANTICOAGULATION CONSULT NOTE - Follow-Up  Pharmacy Consult for Heparin Indication: RIJ DVT  No Known Allergies  Patient Measurements: Height: 5\' 7"  (170.2 cm) Weight: 145 lb 1 oz (65.8 kg) IBW/kg (Calculated) : 61.6 Heparin Dosing Weight: 63 kg  Vital Signs: Temp: 99.7 F (37.6 C) (12/01 2120) Temp Source: Oral (12/01 2120) BP: 116/76 (12/01 1941) Pulse Rate: 93 (12/01 1941)  Labs: Recent Labs    10/22/19 0735  10/23/19 0640  10/24/19 0419 10/24/19 1318 10/24/19 2105  HGB 9.6*  --  8.1*  --  8.4*  --   --   HCT 30.0*  --  25.5*  --  26.5*  --   --   PLT 274  --  210  --  215  --   --   HEPARINUNFRC  --    < > 0.24*   < > 0.20* 0.32 0.26*  CREATININE 10.27*  --  12.23*  --  7.33*  --   --    < > = values in this interval not displayed.    Estimated Creatinine Clearance: 7 mL/min (A) (by C-G formula based on SCr of 7.33 mg/dL (H)).  Assessment: 52 YOF with ESRD who presented on 11/27 with new CVA and is now found to have RIJ DVT and pharmacy consulted to start Heparin anticoagulation - low goal and no bolus with concurrent CVA.   Heparin level now slightly slow at 0.26.  Goal of Therapy:  Heparin level 0.3-0.5 units/ml Monitor platelets by anticoagulation protocol: Yes   Plan:  Increase heparin to 1150 units/hr Daily hep lvl cbc  Barth Kirks, PharmD, BCPS, BCCCP Clinical Pharmacist [phone]  Please check AMION for all [place] numbers  10/24/2019 9:30 PM

## 2019-10-24 NOTE — Progress Notes (Signed)
Occupational Therapy Evaluation Patient Details Name: [name] MRN: [id number] DOB: 12/15/1950 Today's Date: 10/24/2019    History of Present Illness [name] is a 68 y.o. female with history of ESRD ( on dialysis M-W-F), TIA 2010, HTN, HLD, Ha, DM2 who presented to  [facility] with c/o dizziness, left arm weakness and left leg numbness. MRI revealed acute R basal ganglia infarct. In addition, she has R IJ DVT and spiked a fever 11/27 with concern for sepsis.    Clinical Impression   PTA, pt was living at home with her son, pt was independent with ADL/IADL and functional mobility and drives. Pt currently requires minA for grooming supported in bed, maxA for UB ADL and totalA for LB ADL. Pt was able to tolerate sitting EOB ~48min with maxA for stability. Due to decline in current level of function, pt would benefit from acute OT to address established goals to facilitate safe D/C to venue listed below. At this time, recommend CIR follow-up. Will continue to follow acutely.     Follow Up Recommendations  CIR;Supervision/Assistance - 24 hour    Equipment Recommendations  3 in 1 bedside commode    Recommendations for Other Services       Precautions / Restrictions Precautions Precautions: Fall Restrictions Weight Bearing Restrictions: No      Mobility Bed Mobility Overal bed mobility: Needs Assistance Bed Mobility: Supine to Sit;Sit to Supine     Supine to sit: Max assist;+2 for physical assistance;+2 for safety/equipment Sit to supine: Mod assist;+2 for safety/equipment;HOB elevated   General bed mobility comments: max assist to raise trunk to sitting position due to pain in left hip. Unable to tolerate sitting for more than a couple of minutes. Started to lie back down. Required assist for positioning once in supine  Transfers Overall transfer level: Needs assistance Equipment used: None Transfers: Lateral/Scoot Transfers;Sit to/from Stand Sit to  Stand: +2 physical assistance;Max assist        Lateral/Scoot Transfers: Max assist;+2 physical assistance General transfer comment: deferred    Balance Overall balance assessment: Needs assistance Sitting-balance support: Bilateral upper extremity supported;Feet supported Sitting balance-Leahy Scale: Zero Sitting balance - Comments: required maxA to maintain upright sitting balance, with with L lateral lean and attempting to place head on therapist's shoulder Postural control: Left lateral lean Standing balance support: Bilateral upper extremity supported Standing balance-Leahy Scale: Zero                             ADL either performed or assessed with clinical judgement   ADL Overall ADL's : Needs assistance/impaired Eating/Feeding: Moderate assistance;Sitting   Grooming: Minimal assistance;Sitting Grooming Details (indicate cue type and reason): bed level Upper Body Bathing: Maximal assistance   Lower Body Bathing: Total assistance   Upper Body Dressing : Maximal assistance   Lower Body Dressing: Total assistance   Toilet Transfer: Maximal assistance;+2 for physical assistance Toilet Transfer Details (indicate cue type and reason): pt unable to tolerate standing this session;poor balance sitting EOB Toileting- Clothing Manipulation and Hygiene: Total assistance         General ADL Comments: deferred OOB mobility secondary to pt decreased balance sitting EOB and reports of increased lightheadedness;pt required maxA for sitting balance OEB     Vision Baseline Vision/History: Wears glasses Wears Glasses: At all times Vision Assessment?: Vision impaired- to be further tested in functional context Additional Comments: difficult to assess, pt kept eyes shut majority of the  time;pt with decreased visual attention during pursuits     Perception     Praxis      Pertinent Vitals/Pain Pain Assessment: 0-10 Faces Pain Scale: Hurts even more Pain Location:  left hip Pain Descriptors / Indicators: Aching;Grimacing;Guarding Pain Intervention(s): Limited activity within patient's tolerance;Monitored during session     Hand Dominance Right   Extremity/Trunk Assessment Upper Extremity Assessment Upper Extremity Assessment: Generalized weakness;RUE deficits/detail;LUE deficits/detail RUE Deficits / Details: grossly 3/5 LUE Deficits / Details: LUE weaker than RUE;grossly 2/5 LUE Coordination: decreased fine motor;decreased gross motor   Lower Extremity Assessment Lower Extremity Assessment: Defer to PT evaluation LLE Deficits / Details: hip flex 2/5, knee ext 2-/5, L knee buckling with attempted stand LLE Sensation: (unable to test today) LLE Coordination: decreased gross motor   Cervical / Trunk Assessment Cervical / Trunk Assessment: Normal   Communication Communication Communication: No difficulties   Cognition Arousal/Alertness: Awake/alert Behavior During Therapy: WFL for tasks assessed/performed Overall Cognitive Status: No family/caregiver present to determine baseline cognitive functioning                                 General Comments: lethargic and question accuracy of her history   General Comments  pt's son present during session;son and pt really want pt to go to CIR    Exercises     Shoulder Instructions      Home Living Family/patient expects to be discharged to:: Private residence Living Arrangements: Other relatives Available Help at Discharge: Family Type of Home: House Home Access: Stairs to enter Technical brewer of Steps: 3 Entrance Stairs-Rails: Right;Left;Can reach both Home Layout: One level               Home Equipment: Cane - single point   Additional Comments: pt states that she lives with her brother, chart states that she lives with her husband?      Prior Functioning/Environment Level of Independence: Independent        Comments: household and short distanced  community ambulator, drives        OT Problem List: Decreased strength;Decreased range of motion;Impaired balance (sitting and/or standing);Decreased safety awareness;Decreased cognition;Impaired vision/perception;Decreased coordination;Decreased activity tolerance;Pain;Impaired UE functional use      OT Treatment/Interventions: Self-care/ADL training;Therapeutic exercise;DME and/or AE instruction;Neuromuscular education;Therapeutic activities;Cognitive remediation/compensation;Visual/perceptual remediation/compensation;Patient/family education;Balance training    OT Goals(Current goals can be found in the care plan section) Acute Rehab OT Goals Patient Stated Goal: to get back to her normal OT Goal Formulation: With patient Time For Goal Achievement: 11/07/19 Potential to Achieve Goals: Good ADL Goals Pt Will Perform Grooming: with min guard assist Pt Will Perform Lower Body Dressing: with min assist;sit to/from stand Pt Will Transfer to Toilet: with min assist;stand pivot transfer Additional ADL Goal #1: Pt will follow multi-step commands consistently during ADL completion. Additional ADL Goal #2: Pt will attend to ADL for 5 min without vc.  OT Frequency: Min 2X/week   Barriers to D/C: Decreased caregiver support          Co-evaluation              AM-PAC OT "6 Clicks" Daily Activity     Outcome Measure Help from another person eating meals?: A Little Help from another person taking care of personal grooming?: A Lot Help from another person toileting, which includes using toliet, bedpan, or urinal?: Total Help from another person bathing (including washing, rinsing, drying)?: A Lot Help from  another person to put on and taking off regular upper body clothing?: A Lot Help from another person to put on and taking off regular lower body clothing?: Total 6 Click Score: 11   End of Session Nurse Communication: Mobility status  Activity Tolerance: Patient limited by  pain;Patient tolerated treatment well Patient left: in bed;with call bell/phone within reach;with bed alarm set;with family/visitor present  OT Visit Diagnosis: Unsteadiness on feet (R26.81);Other abnormalities of gait and mobility (R26.89);Muscle weakness (generalized) (M62.81);Low vision, both eyes (H54.2);Other symptoms and signs involving cognitive function;Pain Pain - Right/Left: Left Pain - part of body: Hip                Time: 1411-1431 OT Time Calculation (min): 20 min Charges:  OT General Charges $OT Visit: 1 Visit OT Evaluation $OT Eval Moderate Complexity: [place] OTR/L Acute Rehabilitation Services Office: [place] 10/24/2019, 3:14 PM

## 2019-10-24 NOTE — Consult Note (Signed)
   [facility] CM Inpatient Consult   10/24/2019  [name] 02-19-1951 [id number]   Patient screened for extreme high risk score for unplanned readmission score of 38%  And for 4 hospitalizations in the past 6 months. Reviewed to check for restart of [facility] service needs.  Patient with [facility] Choice PPO noted.  Patient has had multiple outreach attempts by a [facility] Coordinator for a recent hospitalization.  Review of patient's medical record reveals that patient is being recommended for a skilled nursing facility stay verses CIR.  Also, noted that patient is from [place], [place]  Primary Care Provider is Consuello Masse MD, [facility], [place] and this office provides the transition of care [TOC] follow up for post hospital.   Will follow with inpatient [facility] team for disposition assessment needs.  Please place a [facility] Care Management consult as appropriate and for questions contact:   Natividad Brood, RN BSN [facility] Liaison  [phone] business mobile phone Toll free office [phone]  Fax number: [phone] [email or web address]

## 2019-10-25 ENCOUNTER — Inpatient Hospital Stay (HOSPITAL_COMMUNITY): Payer: Medicare PPO

## 2019-10-25 ENCOUNTER — Encounter (HOSPITAL_COMMUNITY): Payer: Self-pay | Admitting: *Deleted

## 2019-10-25 DIAGNOSIS — N186 End stage renal disease: Secondary | ICD-10-CM | POA: Diagnosis not present

## 2019-10-25 DIAGNOSIS — F329 Major depressive disorder, single episode, unspecified: Secondary | ICD-10-CM

## 2019-10-25 DIAGNOSIS — N185 Chronic kidney disease, stage 5: Secondary | ICD-10-CM

## 2019-10-25 DIAGNOSIS — I6322 Cerebral infarction due to unspecified occlusion or stenosis of basilar arteries: Secondary | ICD-10-CM | POA: Diagnosis not present

## 2019-10-25 DIAGNOSIS — D631 Anemia in chronic kidney disease: Secondary | ICD-10-CM

## 2019-10-25 DIAGNOSIS — B965 Pseudomonas (aeruginosa) (mallei) (pseudomallei) as the cause of diseases classified elsewhere: Secondary | ICD-10-CM | POA: Diagnosis not present

## 2019-10-25 DIAGNOSIS — R7881 Bacteremia: Secondary | ICD-10-CM | POA: Diagnosis not present

## 2019-10-25 LAB — GLUCOSE, CAPILLARY
Glucose-Capillary: 192 mg/dL — ABNORMAL HIGH (ref 70–99)
Glucose-Capillary: 168 mg/dL — ABNORMAL HIGH (ref 70–99)
Glucose-Capillary: 119 mg/dL — ABNORMAL HIGH (ref 70–99)
Glucose-Capillary: 177 mg/dL — ABNORMAL HIGH (ref 70–99)
Glucose-Capillary: 316 mg/dL — ABNORMAL HIGH (ref 70–99)

## 2019-10-25 LAB — CBC
HCT: 23.4 % — ABNORMAL LOW (ref 36.0–46.0)
Hemoglobin: 7.4 g/dL — ABNORMAL LOW (ref 12.0–15.0)
MCH: 28.7 pg (ref 26.0–34.0)
MCHC: 31.6 g/dL (ref 30.0–36.0)
MCV: 90.7 fL (ref 80.0–100.0)
Platelets: 200 10*3/uL (ref 150–400)
RBC: 2.58 MIL/uL — ABNORMAL LOW (ref 3.87–5.11)
RDW: 17.4 % — ABNORMAL HIGH (ref 11.5–15.5)
WBC: 10.2 10*3/uL (ref 4.0–10.5)
nRBC: 0 % (ref 0.0–0.2)

## 2019-10-25 LAB — CULTURE, BLOOD (ROUTINE X 2)
Special Requests: ADEQUATE
Special Requests: ADEQUATE
Special Requests: ADEQUATE

## 2019-10-25 LAB — RENAL FUNCTION PANEL
Albumin: 1.9 g/dL — ABNORMAL LOW (ref 3.5–5.0)
Anion gap: 18 — ABNORMAL HIGH (ref 5–15)
BUN: 53 mg/dL — ABNORMAL HIGH (ref 8–23)
CO2: 21 mmol/L — ABNORMAL LOW (ref 22–32)
Calcium: 8.4 mg/dL — ABNORMAL LOW (ref 8.9–10.3)
Chloride: 98 mmol/L (ref 98–111)
Creatinine, Ser: 9.14 mg/dL — ABNORMAL HIGH (ref 0.44–1.00)
GFR calc Af Amer: 5 mL/min — ABNORMAL LOW (ref >60)
GFR calc non Af Amer: 4 mL/min — ABNORMAL LOW (ref >60)
Glucose, Bld: 113 mg/dL — ABNORMAL HIGH (ref 70–99)
Phosphorus: 5.5 mg/dL — ABNORMAL HIGH (ref 2.5–4.6)
Potassium: 4.8 mmol/L (ref 3.5–5.1)
Sodium: 137 mmol/L (ref 135–145)

## 2019-10-25 LAB — TROPONIN I (HIGH SENSITIVITY)
Troponin I (High Sensitivity): 84 ng/L — ABNORMAL HIGH (ref <18)
Troponin I (High Sensitivity): 73 ng/L — ABNORMAL HIGH (ref <18)

## 2019-10-25 LAB — HEPARIN LEVEL (UNFRACTIONATED)
Heparin Unfractionated: 0.43 IU/mL (ref 0.30–0.70)
Heparin Unfractionated: 0.42 IU/mL (ref 0.30–0.70)

## 2019-10-25 NOTE — Progress Notes (Signed)
[facility]   Reason for visit: Follow up on recurrent Pseudomonal bacteremia  Interval History: Pt reports improvement in appetite to ~75% of trays eaten. Ambulation remains an issue as pt is in TLSO brace with efforts to stand. Permacath was removed Monday following HD. TEE postponed until tomorrow. Tagged WBC scan ordered for today but radiology unable to schedule pt for procedure today per nursing due to high census issues for our facility. No HD planned for today.  Fever curve, WBC & Cr trends, imaging, cx results, and ABX usage all independently reviewed    Current Facility-Administered Medications:  .  0.9 %  sodium chloride infusion, , Intravenous, Continuous, Tat, David, MD, Stopped at 10/20/19 1317 .  acetaminophen (TYLENOL) tablet 650 mg, 650 mg, Oral, Q4H PRN, 650 mg at 10/24/19 1739 **OR** acetaminophen (TYLENOL) 160 MG/5ML solution 650 mg, 650 mg, Per Tube, Q4H PRN **OR** acetaminophen (TYLENOL) suppository 650 mg, 650 mg, Rectal, Q4H PRN, Tat, David, MD .  acetaminophen (TYLENOL) tablet 1,000 mg, 1,000 mg, Oral, Once, Tat, David, MD .  calcium carbonate (TUMS - dosed in mg elemental calcium) chewable tablet 200 mg of elemental calcium, 1 tablet, Oral, 2 times per day, Tat, David, MD, 200 mg of elemental calcium at 10/25/19 1254 .  carvedilol (COREG) tablet 6.25 mg, 6.25 mg, Oral, BID, Rosalin Hawking, MD, 6.25 mg at 10/25/19 1004 .  ceFEPIme (MAXIPIME) 1 g in sodium chloride 0.9 % 100 mL IVPB, 1 g, Intravenous, Q24H, Rolla Flatten, [facility], Last Rate: 200 mL/hr at 10/24/19 2145, 1 g at 10/24/19 2145 .  Chlorhexidine Gluconate Cloth 2 % PADS 6 each, 6 each, Topical, Q0600, Claudia Desanctis, MD, 6 each at 10/25/19 0602 .  doxercalciferol (HECTOROL) injection 1.5 mcg, 1.5 mcg, Intravenous, Q M,W,F-HD, Claudia Desanctis, MD, 1.5 mcg at 10/23/19 1142 .  fluticasone (FLONASE) 50 MCG/ACT nasal spray 1 spray, 1 spray, Each Nare, Daily PRN, Tat, David, MD .   gabapentin (NEURONTIN) capsule 100 mg, 100 mg, Oral, QHS, Tat, David, MD, 100 mg at 10/24/19 2143 .  heparin ADULT infusion 100 units/mL (25000 units/231mL sodium chloride 0.45%), 1,150 Units/hr, Intravenous, Continuous, Domenic Polite, MD, Last Rate: 11.5 mL/hr at 10/24/19 2144, 1,150 Units/hr at 10/24/19 2144 .  hydrALAZINE (APRESOLINE) injection 10 mg, 10 mg, Intravenous, Q6H PRN, Tat, David, MD .  insulin aspart (novoLOG) injection 0-5 Units, 0-5 Units, Subcutaneous, QHS, Tat, David, MD .  insulin aspart (novoLOG) injection 0-9 Units, 0-9 Units, Subcutaneous, TID WC, Orson Eva, MD, 2 Units at 10/25/19 0830 .  isosorbide mononitrate (IMDUR) 24 hr tablet 30 mg, 30 mg, Oral, QPC lunch, Tat, David, MD, 30 mg at 10/25/19 1254 .  multivitamin (RENA-VIT) tablet 1 tablet, 1 tablet, Oral, QPC lunch, Tat, David, MD, 1 tablet at 10/24/19 2143 .  ondansetron (ZOFRAN) injection 4 mg, 4 mg, Intravenous, Q6H PRN, Omar Person, NP, 4 mg at 10/24/19 0615 .  oxyCODONE (Oxy IR/ROXICODONE) immediate release tablet 5 mg, 5 mg, Oral, Q6H PRN, Domenic Polite, MD, 5 mg at 10/25/19 0831 .  senna-docusate (Senokot-S) tablet 1 tablet, 1 tablet, Oral, QHS PRN, Tat, Shanon Brow, MD  Facility-Administered Medications Ordered in Other Encounters:  .  0.9 %  sodium chloride infusion, , Intravenous, Continuous, Monia Sabal, [place]   Physical Exam:   Vitals:   10/25/19 0806 10/25/19 1145  BP: 118/70 (!) 148/84  Pulse: 84 89  Resp: 19 19  Temp: 99.2 F (37.3 C) 99 F (37.2 C)  SpO2: 100%    Physical Exam Gen: pleasant, chronically ill, moderate distress secondary to back pain/discomfort from TLSO brace, A&Ox 3 Head: NCAT, no temporal wasting evident EENT: PERRL, EOMI, MMM, adequate dentition Neck: supple, no JVD CV: NRRR, no murmurs evident but heart sounds are distant Pulm: CTA bilaterally, no wheeze or retractions Abd: soft, obese, NTND, +BS Extrems: 1+ non-pitting LE edema, 1+ pulses MSK: TTP at lumbar  spine, TLSO brace intact Skin: no rashes, adequate skin turgor Neuro: CN II-XII grossly intact, no focal neurologic deficits appreciated, gait was not assessed, A&Ox 3   Review of Systems:  Review of Systems  Constitutional: Positive for malaise/fatigue. Negative for chills, fever and weight loss.  HENT: Negative for congestion, hearing loss, sinus pain and sore throat.   Eyes: Negative for blurred vision, photophobia and discharge.  Respiratory: Negative for cough, hemoptysis and shortness of breath.   Cardiovascular: Negative for chest pain, palpitations, orthopnea and leg swelling.  Gastrointestinal: Negative for abdominal pain, constipation, diarrhea, heartburn, nausea and vomiting.  Genitourinary: Negative for dysuria, flank pain, frequency and urgency.  Musculoskeletal: Positive for back pain and joint pain. Negative for myalgias.  Skin: Negative for itching and rash.  Neurological: Positive for weakness. Negative for tremors, seizures and headaches.  Endo/Heme/Allergies: Negative for polydipsia. Does not bruise/bleed easily.  Psychiatric/Behavioral: Positive for depression. Negative for substance abuse. The patient is not nervous/anxious and does not have insomnia.      Lab Results  Component Value Date   WBC 10.2 10/25/2019   HGB 7.4 (L) 10/25/2019   HCT 23.4 (L) 10/25/2019   MCV 90.7 10/25/2019   PLT 200 10/25/2019    Lab Results  Component Value Date   CREATININE 9.14 (H) 10/25/2019   BUN 53 (H) 10/25/2019   NA 137 10/25/2019   K 4.8 10/25/2019   CL 98 10/25/2019   CO2 21 (L) 10/25/2019    Lab Results  Component Value Date   ALT 16 10/20/2019   AST 12 (L) 10/20/2019   ALKPHOS 75 10/20/2019     Microbiology: Recent Results (from the past 240 hour(s))  SARS CORONAVIRUS 2 (TAT 6-24 HRS) Nasopharyngeal Nasopharyngeal Swab     Status: None   Collection Time: 10/20/19  7:30 AM   Specimen: Nasopharyngeal Swab  Result Value Ref Range Status   SARS Coronavirus 2  NEGATIVE NEGATIVE Final    Comment: (NOTE) SARS-CoV-2 target nucleic acids are NOT DETECTED. The SARS-CoV-2 RNA is generally detectable in upper and lower respiratory specimens during the acute phase of infection. Negative results do not preclude SARS-CoV-2 infection, do not rule out co-infections with other pathogens, and should not be used as the sole basis for treatment or other patient management decisions. Negative results must be combined with clinical observations, patient history, and epidemiological information. The expected result is Negative. Fact Sheet for Patients: SugarRoll.be Fact Sheet for Healthcare Providers: https://www.woods-mathews.com/ This test is not yet approved or cleared by the [place] FDA and  has been authorized for detection and/or diagnosis of SARS-CoV-2 by FDA under an Emergency Use Authorization (EUA). This EUA will remain  in effect (meaning this test can be used) for the duration of the COVID-19 declaration under Section 56 4(b)(1) of the Act, 21 U.S.C. section 360bbb-3(b)(1), unless the authorization is terminated or revoked sooner. Performed at [facility], [place] [address] [place], [place] [address]   Culture, blood (routine x 2)     Status: Abnormal   Collection Time: 10/22/19  7:35 AM  Specimen: BLOOD  Result Value Ref Range Status   Specimen Description BLOOD RIGHT ANTECUBITAL  Final   Special Requests   Final    BOTTLES DRAWN AEROBIC ONLY Blood Culture adequate volume   Culture  Setup Time   Final    GRAM NEGATIVE RODS AEROBIC BOTTLE ONLY CRITICAL VALUE NOTED.  VALUE IS CONSISTENT WITH PREVIOUSLY REPORTED AND CALLED VALUE.    Culture (A)  Final    PSEUDOMONAS AERUGINOSA SUSCEPTIBILITIES PERFORMED ON PREVIOUS CULTURE WITHIN THE LAST 5 DAYS. Performed at [facility], [place] [address] [place], [place] [address]    Report Status 10/25/2019 FINAL  Final  Culture, blood (routine  x 2)     Status: Abnormal   Collection Time: 10/22/19  7:43 AM   Specimen: BLOOD LEFT HAND  Result Value Ref Range Status   Specimen Description BLOOD LEFT HAND  Final   Special Requests   Final    BOTTLES DRAWN AEROBIC ONLY Blood Culture adequate volume   Culture  Setup Time   Final    GRAM NEGATIVE RODS AEROBIC BOTTLE ONLY CRITICAL RESULT CALLED TO, READ BACK BY AND VERIFIED WITH: PHARMD V BRYK [id number] AT 725 AM BY CM Performed at [facility], [place] [address] [place], [place] [address]    Culture PSEUDOMONAS AERUGINOSA (A)  Final   Report Status 10/25/2019 FINAL  Final   Organism ID, Bacteria PSEUDOMONAS AERUGINOSA  Final      Susceptibility   Pseudomonas aeruginosa - MIC*    CEFTAZIDIME 2 SENSITIVE Sensitive     CIPROFLOXACIN <=0.25 SENSITIVE Sensitive     GENTAMICIN <=1 SENSITIVE Sensitive     IMIPENEM 1 SENSITIVE Sensitive     PIP/TAZO <=4 SENSITIVE Sensitive     CEFEPIME <=1 SENSITIVE Sensitive     * PSEUDOMONAS AERUGINOSA  Blood Culture ID Panel (Reflexed)     Status: Abnormal   Collection Time: 10/22/19  7:43 AM  Result Value Ref Range Status   Enterococcus species NOT DETECTED NOT DETECTED Final   Listeria monocytogenes NOT DETECTED NOT DETECTED Final   Staphylococcus species NOT DETECTED NOT DETECTED Final   Staphylococcus aureus (BCID) NOT DETECTED NOT DETECTED Final   Streptococcus species NOT DETECTED NOT DETECTED Final   Streptococcus agalactiae NOT DETECTED NOT DETECTED Final   Streptococcus pneumoniae NOT DETECTED NOT DETECTED Final   Streptococcus pyogenes NOT DETECTED NOT DETECTED Final   Acinetobacter baumannii NOT DETECTED NOT DETECTED Final   Enterobacteriaceae species NOT DETECTED NOT DETECTED Final   Enterobacter cloacae complex NOT DETECTED NOT DETECTED Final   Escherichia coli NOT DETECTED NOT DETECTED Final   Klebsiella oxytoca NOT DETECTED NOT DETECTED Final   Klebsiella pneumoniae NOT DETECTED NOT DETECTED Final   Proteus species NOT  DETECTED NOT DETECTED Final   Serratia marcescens NOT DETECTED NOT DETECTED Final   Carbapenem resistance NOT DETECTED NOT DETECTED Final   Haemophilus influenzae NOT DETECTED NOT DETECTED Final   Neisseria meningitidis NOT DETECTED NOT DETECTED Final   Pseudomonas aeruginosa DETECTED (A) NOT DETECTED Final    Comment: CRITICAL RESULT CALLED TO, READ BACK BY AND VERIFIED WITH: PHARMD V BRYK [id number] AT 725 AM BY CM    Candida albicans NOT DETECTED NOT DETECTED Final   Candida glabrata NOT DETECTED NOT DETECTED Final   Candida krusei NOT DETECTED NOT DETECTED Final   Candida parapsilosis NOT DETECTED NOT DETECTED Final   Candida tropicalis NOT DETECTED NOT DETECTED Final    Comment: Performed at [facility],  [address], [place], [place] [address]  Culture, blood (routine x 2)     Status: Abnormal   Collection Time: 10/22/19  5:03 PM   Specimen: BLOOD LEFT HAND  Result Value Ref Range Status   Specimen Description BLOOD LEFT HAND  Final   Special Requests   Final    BOTTLES DRAWN AEROBIC AND ANAEROBIC Blood Culture adequate volume   Culture  Setup Time   Final    AEROBIC BOTTLE ONLY GRAM NEGATIVE RODS CRITICAL VALUE NOTED.  VALUE IS CONSISTENT WITH PREVIOUSLY REPORTED AND CALLED VALUE.    Culture (A)  Final    PSEUDOMONAS AERUGINOSA SUSCEPTIBILITIES PERFORMED ON PREVIOUS CULTURE WITHIN THE LAST 5 DAYS. Performed at [facility], [place] [address], [place], [place] [address]    Report Status 10/25/2019 FINAL  Final  Culture, blood (routine x 2)     Status: Abnormal   Collection Time: 10/22/19  5:08 PM   Specimen: BLOOD LEFT ARM  Result Value Ref Range Status   Specimen Description BLOOD LEFT ARM  Final   Special Requests   Final    BOTTLES DRAWN AEROBIC ONLY Blood Culture results may not be optimal due to an inadequate volume of blood received in culture bottles   Culture  Setup Time   Final    AEROBIC BOTTLE ONLY GRAM NEGATIVE RODS CRITICAL VALUE NOTED.  VALUE IS  CONSISTENT WITH PREVIOUSLY REPORTED AND CALLED VALUE.    Culture (A)  Final    PSEUDOMONAS AERUGINOSA SUSCEPTIBILITIES PERFORMED ON PREVIOUS CULTURE WITHIN THE LAST 5 DAYS. Performed at [facility], [place] [address], [place], [place] [address]    Report Status 10/25/2019 FINAL  Final  Culture, blood (routine x 2)     Status: None (Preliminary result)   Collection Time: 10/24/19  1:18 PM   Specimen: BLOOD LEFT HAND  Result Value Ref Range Status   Specimen Description BLOOD LEFT HAND  Final   Special Requests   Final    BOTTLES DRAWN AEROBIC ONLY Blood Culture adequate volume   Culture   Final    NO GROWTH < 24 HOURS Performed at [facility], [place] [address], [place], [place] [address]    Report Status PENDING  Incomplete  Culture, blood (routine x 2)     Status: None (Preliminary result)   Collection Time: 10/24/19  1:28 PM   Specimen: BLOOD RIGHT HAND  Result Value Ref Range Status   Specimen Description BLOOD RIGHT HAND  Final   Special Requests   Final    BOTTLES DRAWN AEROBIC AND ANAEROBIC Blood Culture results may not be optimal due to an excessive volume of blood received in culture bottles   Culture   Final    NO GROWTH < 24 HOURS Performed at [facility], [place] [address] [place], [place] [address]    Report Status PENDING  Incomplete    Impression/Plan: Patient is a 68 year old African-American female diabetic with end-stage renal disease dialyzing every Monday, Wednesday, Friday with a history of a vascular graft infection in June 2020, status post treatment for Pseudomonal bacteremia and discitis earlier this year who was admitted with new onset left-sided weakness found to have a basal ganglia CVA and recurrent Pseudomonal bacteremia.  1.  Recurrent Pseudomonal bacteremia -the patient's episode of a right arm graft infection in June of this year is worrisome that this may be a persistent source for this patient has Pseudomonas was grown from that site  initially.  Following treatment for the patient's graft infection, she had a permacath placed and developed a subsequent pseudomonal bacteremia with suspected discitis.  She received treatment with a prolonged 6-week course of Fortaz (presumably dosed only at dialysis) and now has been readmitted with another episode of pseudomonal bacteremia, now with a basal ganglia stroke worrisome for embolic event.  I agree with the patient's permacath being removed as this would be a secondary source at minimum now.  While this is a low risk pathogen in normal circumstances, the chronicity and recurrent nature of her infection does warrant a TEE which at present time is planned for tomorrow (Thursday).  Follow-up repeat blood cultures to establish clearance now that the patient's permacath has been removed and continue cefepime for the present time dosed daily rather than with dialysis as dialysis is currently being held.  It is unfortunate that the patient could not have a tagged white blood cell scan performed today to evaluate for a protected focus of infection that is allowing for her recurrences.  This would best be performed on a nondialysis day, so have asked the nursing staff to reach out to radiology to see if it can still be done tomorrow scheduled around her 1 PM TEE and prior to reinitiation of dialysis.  The patient's recently discovered subclavian DVT with may also be playing a role in her infection, and I am leaning towards a repeat 6-week course of treatment for an endovascular infection at this time.  2. ESRD -the patient normally dialyzes every Monday, Wednesday, and Friday.  Her dialysis is currently being held due to the need to remove her pre-existing permacath.  See comments above regarding TEE and tagged white blood cell scan timing.  If her tagged white blood cell scan is unable to be performed on Thursday, it may be best to have this study performed on Friday prior to initiation of hemodialysis as  this would all alter the results of radiotracer pooling.  Hopefully, the patient can tolerate 1 to 2 days off dialysis if further needed.  Will defer this issue to her nephrologist.  If possible, would place a new PermCath at a different site than the patient's last access, given concerns for endovascular/infected DVT at that position.  3. CVA -while the patient does have evidence of old infarcts on her most recent MRI,, she also has new acute right basal ganglia infarcts as well concerning for embolic events from her recurrent/active pseudomonal bacteremia.  Continue antibiotics as noted above for now.  Will defer efforts for rehab/PT to the patient's primary team.

## 2019-10-25 NOTE — Progress Notes (Signed)
  Speech Language Pathology Treatment: Cognitive-Linquistic  Patient Details Name: [name] MRN: [id number] DOB: 08/09/1951 Today's Date: 10/25/2019 Time: [id number] SLP Time Calculation (min) (ACUTE ONLY): 12 min  Assessment / Plan / Recommendation Clinical Impression  Skilled treatment session focused on cognition goals specifically attention, problem solving and memory. SLP recieved pt in bed with her son present for session. SLP facilitated session by providing Min A question cues regarding current plan of care. Pt initially confused and stated that she had HD today but son clarified that pt had PT this morning, not HD. SLP further facilitated sustained attention, memory and problem solving by providing deck of cards with request for pt to sort cards and set aside all of the hearts. Pt was 75% accurate with deficits in selective attention to task as pt able to recall suit and also look thru cords sequentially. SLP left cards with pt and her son. List given of potential actiivies that they could engage in: sorting by numbers, suits, playing WAR etc. All questions answered to their satisfaction.    HPI HPI: [name] with history of ESRD ( on dialysis M-W-F), TIA 2010, HTN, HLD, Ha, DM2 who presented to  [facility] with c/o dizziness, left arm weakness and left leg numbness. MRI revealed acute R basal ganglia infarct. In addition, she has R IJ DVT and spiked a fever 11/27 with concern for sepsis.      SLP Plan  Continue with current plan of care       Recommendations   CIR                General recommendations: Rehab consult Follow up Recommendations: Inpatient Rehab SLP Visit Diagnosis: Attention and concentration deficit Attention and concentration deficit following: Cerebral infarction Plan: Continue with current plan of care       GO                Grayling Schranz 10/25/2019, 4:38 PM

## 2019-10-25 NOTE — Progress Notes (Signed)
RN notified by Tele that pt had some slight ST elevation. This RN assessed pt and she is denying any chest pain. MD Benny Lennert has been notified. Nurse will continue to monitor.

## 2019-10-25 NOTE — Progress Notes (Signed)
ANTICOAGULATION CONSULT NOTE - Follow-Up  Pharmacy Consult for Heparin Indication: RIJ DVT  No Known Allergies  Patient Measurements: Height: 5\' 7"  (170.2 cm) Weight: 144 lb 2.9 oz (65.4 kg) IBW/kg (Calculated) : 61.6 Heparin Dosing Weight: 63 kg  Vital Signs: Temp: 99.2 F (37.3 C) (12/02 0806) Temp Source: Oral (12/02 0806) BP: 148/84 (12/02 1145) Pulse Rate: 84 (12/02 0806)  Labs: Recent Labs    10/23/19 0640  10/24/19 0419  10/24/19 2105 10/25/19 0314 10/25/19 1048  HGB 8.1*  --  8.4*  --   --  7.4*  --   HCT 25.5*  --  26.5*  --   --  23.4*  --   PLT 210  --  215  --   --  200  --   HEPARINUNFRC 0.24*   < > 0.20*   < > 0.26* 0.43 0.42  CREATININE 12.23*  --  7.33*  --   --  9.14*  --    < > = values in this interval not displayed.    Estimated Creatinine Clearance: 5.6 mL/min (A) (by C-G formula based on SCr of 9.14 mg/dL (H)).  Assessment: 42 YOF with ESRD who presented on 11/27 with new CVA and is now found to have RIJ DVT and pharmacy consulted to start Heparin anticoagulation - low goal and no bolus with concurrent CVA.   Heparin level therapeutic at 0.42. Watch CBC, Hgb trending down.  Goal of Therapy:  Heparin level 0.3-0.5 units/ml Monitor platelets by anticoagulation protocol: Yes   Plan:  Continue heparin drip at 1150 units/hr Daily hep lvl and cbc  Alanda Slim, PharmD, [facility] Clinical Pharmacist Please see AMION for all Pharmacists' Contact Phone Numbers 10/25/2019, 12:22 PM

## 2019-10-25 NOTE — Progress Notes (Signed)
STROKE TEAM PROGRESS NOTE   INTERVAL HISTORY Patient laying in bed, no complaints, awake alert, orientated.  She told me that she cannot have dialysis and TEE tomorrow.  Afebrile today.  OBJECTIVE Vitals:   10/24/19 2336 10/25/19 0316 10/25/19 0806 10/25/19 1145  BP: 111/73 131/78 118/70 (!) 148/84  Pulse: 84 77 84 89  Resp: 18 15 19 19   Temp: 98.7 F (37.1 C) 99.3 F (37.4 C) 99.2 F (37.3 C) 99 F (37.2 C)  TempSrc: Oral Oral Oral Oral  SpO2: 100% 100% 100%   Weight:  65.4 kg    Height:        CBC:  Recent Labs  Lab 10/20/19 0659  10/24/19 0419 10/25/19 0314  WBC 15.7*   < > 10.6* 10.2  NEUTROABS 12.4*  --   --   --   HGB 9.7*   < > 8.4* 7.4*  HCT 32.8*   < > 26.5* 23.4*  MCV 91.6   < > 89.2 90.7  PLT 348   < > 215 200   < > = values in this interval not displayed.    Basic Metabolic Panel:  Recent Labs  Lab 10/24/19 0419 10/25/19 0314  NA 136 137  K 4.6 4.8  CL 98 98  CO2 24 21*  GLUCOSE 104* 113*  BUN 40* 53*  CREATININE 7.33* 9.14*  CALCIUM 8.2* 8.4*  PHOS 4.9* 5.5*    Lipid Panel:     Component Value Date/Time   CHOL 150 10/21/2019 0405   TRIG 192 (H) 10/21/2019 0405   HDL 47 10/21/2019 0405   CHOLHDL 3.2 10/21/2019 0405   VLDL 38 10/21/2019 0405   LDLCALC 65 10/21/2019 0405   HgbA1c:  Lab Results  Component Value Date   HGBA1C 7.6 (H) 10/21/2019   Urine Drug Screen:     Component Value Date/Time   LABOPIA NONE DETECTED 07/19/2019 1220   COCAINSCRNUR NONE DETECTED 07/19/2019 1220   LABBENZ NONE DETECTED 07/19/2019 1220   AMPHETMU NONE DETECTED 07/19/2019 1220   THCU NONE DETECTED 07/19/2019 1220   LABBARB NONE DETECTED 07/19/2019 1220    Alcohol Level     Component Value Date/Time   ETH <10 07/19/2019 1405    IMAGING  Dg Chest 1 View 10/20/2019 IMPRESSION:  Possible trace left pleural effusion.   Ct Head Wo Contrast 10/20/2019 IMPRESSION:  1. Acute right basal ganglia infarcts.  2. Mild chronic small vessel ischemic  disease.   Mr Brain 45 Contrast Mr Angio Head Wo Contrast 10/20/2019 IMPRESSION:  1. Acute right basal ganglia infarcts.  2. Chronic ischemia with multiple old infarcts as above.  3. Negative head MRA.   TTE 10/20/2019 Impression 1. Left ventricular ejection fraction, by visual estimation, is 60 to 65%. The left ventricle has normal function. There is mildly increased left ventricular hypertrophy. 2. Left ventricular diastolic parameters are consistent with Grade I diastolic dysfunction (impaired relaxation). 3. Global right ventricle has normal systolic function.The right ventricular size is normal. 4. Right atrial size was normal. 5. Trivial pericardial effusion is present. 6. Mild mitral annular calcification. 7. The mitral valve is normal in structure. Trace mitral valve regurgitation. No evidence of mitral stenosis. 8. The tricuspid valve is normal in structure. Tricuspid valve regurgitation is mild. 9. The aortic valve is tricuspid. Aortic valve regurgitation is not visualized. Mild to moderate aortic valve sclerosis/calcification without any evidence of aortic stenosis. 10. The pulmonic valve was normal in structure. Pulmonic valve regurgitation is not visualized. 11. The inferior  vena cava is normal in size with greater than 50% respiratory variability, suggesting right atrial pressure of 3 mmHg. 12. Normal LV systolic function; grade 1 diastolic dysfunction; mild LVH; mild LAE.  Bilateral Carotid Dopplers  Right Carotid: Velocities in the right ICA are consistent with a 1-39% stenosis. Left Carotid: Velocities in the left ICA are consistent with a 1-39% stenosis. Vertebrals:  Bilateral vertebral arteries demonstrate antegrade flow. Subclavians: Normal flow hemodynamics were seen in bilateral subclavian arteries.  Bilateral Lower Extremity Venous Dopplers Right: There is no evidence of deep vein thrombosis in the lower extremity. Left: There is no evidence of deep vein  thrombosis in the lower extremity. However, portions of this examination were limited- see technologist comments above.  Bilateral upper Extremity Venous Dopplers Right: No evidence of superficial vein thrombosis in the upper extremity. Findings consistent with acute deep vein thrombosis involving the right internal jugular vein. Findings consistent with age indeterminate deep vein thrombosis involving the one of the paired veins of the right brachial veins. Left:  No evidence of thrombosis in the subclavian.  ECG - SR rate 83 BPM. (See cardiology reading for complete details)  Transcranial Dopplers 10/21/19 A vascular evaluation was performed. The right ICA siphon was studied. An IV was inserted into the patient's right forearm. Verbal informed consent was obtained. No obvious evidence of high intensity transient signals (HITS), therefore no evidence of clinically significant patent foramen ovale (PFO).  PHYSICAL EXAM   Temp:  [98.7 F (37.1 C)-101 F (38.3 C)] 99 F (37.2 C) (12/02 1145) Pulse Rate:  [77-93] 89 (12/02 1145) Resp:  [15-25] 19 (12/02 1145) BP: (111-148)/(70-84) 148/84 (12/02 1145) SpO2:  [99 %-100 %] 100 % (12/02 0806) Weight:  [65.4 kg] 65.4 kg (12/02 0316)  General - Well nourished, well developed, in no apparent distress, in HD.  Ophthalmologic - fundi not visualized due to noncooperation.  Cardiovascular - Regular rhythm and rate.  Neuro - awake alert, eyes open, mild abulia. Orientated to time place and people. No aphasia, following all simple commands, able to name and repeat. PERRL, visual field full, EOMI, no gaze preference. Mild left facial asymmetry, tongue midline. LUE 4/5 with pronator drift, finger grip 4/5. LLE proximal 3/5, knee flexion 4/5, foot DF and PF 4/5. Sensation symmetrical bilaterally, FTN intact bilaterally although slow on the left. DTR 1+ on the left and no babinski. Gait not tested.    ASSESSMENT/PLAN [name] is a 68 y.o.  female with history of ESRD ( on dialysis M-W-F), TIA 2010, HTN, HLD, Ha, DM2 who presented to [facility] with c/o dizziness, left arm weakness and left leg numbness. She did not receive IV t-PA due to late presentation (>4.5 hours from time of onset)  Stroke:  acute right BG, CR, caudate scattered infarcts, embolic pattern, source unclear  CT head - Acute right basal ganglia infarcts.   MRI head -  Acute right BG, CR, caudate scattered infarcts. Chronic ischemia with multiple old infarcts as above.   MRA head - Negative   Carotid Doppler unremarkable  2D Echo - EF 60 - 65%  Bilateral LE Venous Dopplers no DVT  Bilateral UE venous doppler - right IJ acute DVT, right brachial vein age-indetermined DVT  TCD bubble study - no PFO but lack of effort on valsalva.  TEE 12/3 at 1100 to rule out endocarditis  Recommend outpt 30 day cardiac event monitoring to rule out afib  LDL - 65  HgbA1c - 7.6  VTE prophylaxis -  [place] Heparin  aspirin 81 mg daily and clopidogrel 75 mg daily prior to admission, now on IV heparin. May consider transition to oral [facility] once appropriate   Therapy recommendations:  SNF. CIR if can tolerate  Disposition:  Pending  Right IJ acute DVT  Found on UE venous Doppler  Could be related to dialysis catheter  On heparin IV - can consider transition to [facility] once appropriate  Treatment as per primary team  ESRD on HD  04/2019 right arm AV graft removed  Cre 7.76->8.86->10.27->12.23->7.33->9.14  MWF schedule  Nephrology on board  Last HD Mon 11/30, Next HD likely Thurs 12/3 (line holiday)  Recurrent Pseudomonas Bacteremia  06/2019 -admitted for altered mental status, blood culture showed positive gram negative rods, confirmed for Pseudomonas infection, concerning for coming from left 3-4 discitis/osteomyelitis.  Treated with cefepime initially and then transition to [facility] for 6 weeks.  08/2019 blood culture repeat negative.  10/22/19 1/4  blood culture - pseudomonas 4/4 BCx- on cefepime now  TEE 12/3 at 1100 to rule out endocarditis.  Hypertension  Home BP meds: Coreg ; Norvasc ; Imdur  On  Imdur, coreg  BP stable  Long-term BP goal normotensive  Diabetes type II  Home diabetic meds: insulin  Current diabetic meds: insulin  HgbA1c 7.6, goal < 7.0  SSI  CBG monitoring  Close PCP follow-up  Fever   Tmax 101.9->100.7->afebrile->101  WBC 15.7->10.9->13.3 ->11.1->10.6->10.2  blood cultures 4/4 pseudomonas - on cefepime  UA WBC 6-10  On Cefepime   Other Stroke Risk Factors  Advanced age  Hx TIA in 2010  Other Active Problems  Anemia due to ESRD - Hb - 9.7->8.6->9.6->8.1->8.4->7.4  Hospital day # 5  Diana Hawking, MD PhD Stroke Neurology 10/25/2019 3:00 PM   To contact Stroke Continuity provider, please refer to [email or web address] After hours, contact General Neurology

## 2019-10-25 NOTE — Progress Notes (Signed)
ANTICOAGULATION CONSULT NOTE - Follow Up Consult  Pharmacy Consult for heparin Indication: DVT  Labs: Recent Labs    10/23/19 0640  10/24/19 0419 10/24/19 1318 10/24/19 2105 10/25/19 0314  HGB 8.1*  --  8.4*  --   --  7.4*  HCT 25.5*  --  26.5*  --   --  23.4*  PLT 210  --  215  --   --  200  HEPARINUNFRC 0.24*   < > 0.20* 0.32 0.26* 0.43  CREATININE 12.23*  --  7.33*  --   --  9.14*   < > = values in this interval not displayed.    Assessment/Plan:  68yo female therapeutic on heparin after rate change. Will continue gtt at current rate and confirm stable with additional level.    Wynona Neat, PharmD, BCPS  10/25/2019,4:59 AM

## 2019-10-25 NOTE — H&P (View-Only) (Signed)
PROGRESS NOTE  [name] [id number] DOB: 09-24-1951 DOA: 10/20/2019 PCP: Diana Hilding, MD  Brief History   Diana McCorkleis a 68 y.o.femalewith medical history ofESRD (MWF), diabetes mellitus type 2, stroke, hypertension, hyperlipidemia presented with left-sided weakness,  when she woke up to get ready for dialysis at 4 AM on 10/20/2019, the patient noted left-sided weakness and had difficulty ambulating. Shw was found to have right basal ganglia infarcts on MRI. Her hospitalization complicated by fevers of 101, and finding of  right IJ DVT on Dopplers. She is now found to have recurrent Pseudomonas bacteremia. HD catheter was removed on 10/23/2019.   Consultants  . Infectious disease . Nephrology . Neurology  Procedures  . Dialysis  Antibiotics   Anti-infectives (From admission, onward)   Start     Dose/Rate Route Frequency Ordered Stop   10/24/19 2000  ceFEPIme (MAXIPIME) 1 g in sodium chloride 0.9 % 100 mL IVPB     1 g 200 mL/hr over 30 Minutes Intravenous Every 24 hours 10/23/19 1100     10/23/19 1200  ceFEPIme (MAXIPIME) 2 g in sodium chloride 0.9 % 100 mL IVPB     2 g 200 mL/hr over 30 Minutes Intravenous Every Mon (Hemodialysis) 10/23/19 1044 10/23/19 1217   10/23/19 0745  ceFEPIme (MAXIPIME) 2 g in sodium chloride 0.9 % 100 mL IVPB  Status:  Discontinued     2 g 200 mL/hr over 30 Minutes Intravenous  Once 10/23/19 0736 10/23/19 1115    .  Subjective  The patient is resting comfortably. No new complaints.  Objective   Vitals:  Vitals:   10/25/19 1145 10/25/19 1600  BP: (!) 148/84 130/81  Pulse: 89 87  Resp: 19 18  Temp: 99 F (37.2 C) 98.9 F (37.2 C)  SpO2:  97%   Exam:  Constitutional:  . The patient is awake, alert, and oriented x 3. No acute distress. Eyes:  . pupils and irises appear normal . Normal lids and conjunctivae ENMT:  . grossly normal hearing  . Lips appear normal . external ears, nose appear normal . Oropharynx:  mucosa, tongue,posterior pharynx appear normal Neck:  . neck appears normal, no masses, normal ROM, supple . no thyromegaly Respiratory:  . No increased work of breathing. . No wheezes, rales, or rhonchi . No tactile fremitus Cardiovascular:  . Regular rate and rhythm . No murmurs, ectopy, or gallups. . No lateral PMI. No thrills. Abdomen:  . Abdomen is soft, non-tender, non-distended . No hernias, masses, or organomegaly . Normoactive bowel sounds.  Musculoskeletal:  . No cyanosis, clubbing, or edema Skin:  . No rashes, lesions, ulcers . palpation of skin: no induration or nodules Neurologic:  . CN 2-12 intact . Sensation all 4 extremities intact Psychiatric:  . Mental status o Mood, affect appropriate o Orientation to person, place, time  . judgment and insight appear intact   I have personally reviewed the following:   Today's Data  . Vitals, BMP, CBC, troponins .  Micro Data  . Blood cultures: 2/2 positive for pseudomonas aeruginosa . Surveillance cultures 2/2 (10/24/2019) Have had no growth.  Imaging  . MRI left hip:  o No findings suspicious for osteomyelitis or septic arthritis o Edema of the paraspinal and psoas muscles o osteodystrophy  Scheduled Meds: . acetaminophen  1,000 mg Oral Once  . calcium carbonate  1 tablet Oral 2 times per day  . carvedilol  6.25 mg Oral BID  . Chlorhexidine Gluconate Cloth  6 each Topical Q0600  .  doxercalciferol  1.5 mcg Intravenous Q M,W,F-HD  . gabapentin  100 mg Oral QHS  . insulin aspart  0-5 Units Subcutaneous QHS  . insulin aspart  0-9 Units Subcutaneous TID WC  . isosorbide mononitrate  30 mg Oral QPC lunch  . multivitamin  1 tablet Oral QPC lunch   Continuous Infusions: . sodium chloride Stopped (10/20/19 1317)  . ceFEPime (MAXIPIME) IV 1 g (10/24/19 2145)  . heparin 1,150 Units/hr (10/24/19 2144)    Active Problems:   ESRD needing dialysis ([place])   Anemia of chronic renal failure   Type 2 diabetes  mellitus with diabetic nephropathy (HCC)   Bacteremia due to Pseudomonas   Acute ischemic stroke ([place])   Acute left-sided weakness   LOS: 5 days   A & P  Acute ischemic stroke -MRI notes right basal ganglia infarcts, continues to have significant left-sided deficits -2D echocardiogram with preserved EF, no cardiac source of embolus -Carotid duplex is unremarkable, no PFO noted on TCD bubble study -Patient was on aspirin and Plavix at baseline which was continued however given concomitant new finding of acute DVT in her right IJ she was started on IV heparin without bolus, after discussion with neurology Dr. Erlinda Romero -Now given bacteremia, endocarditis may be a consideration as well, requested TEE, per [facility], planned for Thursday 12/3 -Aspirin and Plavix discontinued 11/29 -Hemoglobin A1c is 7.6, LDL is 65 -Neurology consulting -PT OT, SLP evaluation completed, CIR recommended -CIR consulted  Recurrent Pseudomonas bacteremia -Likely secondary to HD catheter, previously treated with 6-week course of IV [name] which was completed 10/15, previous HD catheter was removed and she had a new right IJ tunneled catheter placed then -After discussion with nephrology and infectious disease, HD catheter was removed 11/30 in IR for line holiday, repeat blood cultures today -Appreciate infectious disease consult -Continue IV cefepime day 2 -Discussed with [facility] heart care for TEE, plan for Thursday 12/3 -Patient also complains of pain and discomfort in her left hip since yesterday, will obtain an MRI of this area given recurrent bacteremia -Back in the end of August when she had Pseudomonas bacteremia and there was concern for possible discitis of L3-L4, this was later felt to be renal spondyloarthropathy on repeat imaging  Right IJ acute DVT -This is secondary to her HD catheter, discussed with renal, given concurrent bacteremia will need HD catheter removal -Treat with anticoagulation for 3 months after  catheter removal, given ESRD options are limited,continue IV heparin without bolus and aim for a lower PTT, hold off on Coumadin until new dialysis access issues sorted out  ESRD -Nephrology consulted, usually dialyzes Monday Wednesday Friday -Right IJ HD catheter removed 11/30, last HD was yesterday 11/30  Diabetes mellitus type 2 -Hemoglobin A1c is 7.6 -NovoLog sliding scale  Essential hypertension -Restart Coreg, amlodipine on hold -hydralazine prn SBP >220  Anemia of chronic disease -Per renal  DVT prophylaxis: Heparin IV Code Status: DNR Family Communication: no family at bedside Disposition Plan: Possibly CIR when above work-up completed  Diana Rehfeldt, DO [facility] Direct contact: see [email or web address]  7PM-7AM contact night coverage as above 10/25/2019, 6:15 PM  LOS: 5 days

## 2019-10-25 NOTE — Progress Notes (Signed)
Physical Therapy Treatment Patient Details Name: [name] MRN: [id number] DOB: [birth year] Today's Date: 10/25/2019    History of Present Illness [name] is a 68 y.o. female with history of ESRD ( on dialysis M-W-F), TIA 2010, HTN, HLD, Ha, DM2 who presented to  [facility] with c/o dizziness, left arm weakness and left leg numbness. MRI revealed acute R basal ganglia infarct. In addition, she has R IJ DVT and spiked a fever 11/27 with concern for sepsis.     PT Comments    Patient received in bed, agrees to PT session. Reports she is having decreased pain today. Patient requires mod assist with bed mobility. She is generally weak and unable to maintain sitting independently at edge of bed. Difficult to don back brace in sitting due to poor sitting balance. Patient able to stand from elevated surface with mod assist. Cues for hand placement, slightly impulsive and has poor safety awareness with mobility. She was able to take a few steps using RW and mod assist from bed to recliner. She was worn out after this task. Patient has definite improved tolerance to activity this session with decreased pain. She is very weak and will benefit from continued skilled PT to improve strength and functional mobility.        Follow Up Recommendations  SNF- CIR if can tolerate     Equipment Recommendations  None recommended by PT    Recommendations for Other Services       Precautions / Restrictions Precautions Precautions: Fall Restrictions Weight Bearing Restrictions: No    Mobility  Bed Mobility Overal bed mobility: Needs Assistance Bed Mobility: Sidelying to Sit Rolling: Min assist Sidelying to sit: Mod assist Supine to sit: Mod assist     General bed mobility comments: Requires constant hands on assist to maintain sitting balance once assisted to edge of bed. Difficult to don brace due to patients inability to maintain sitting balance  independently  Transfers Overall transfer level: Needs assistance Equipment used: Rolling walker (2 wheeled) Transfers: Sit to/from Stand Sit to Stand: Mod assist;From elevated surface            Ambulation/Gait Ambulation/Gait assistance: Mod assist Gait Distance (Feet): 3 Feet Assistive device: Rolling walker (2 wheeled) Gait Pattern/deviations: Step-to pattern;Shuffle;Trunk flexed;Narrow base of support;Decreased stride length Gait velocity: decreased   General Gait Details: patient able to take a few steps with RW and mod assist. Lethargic, weak, unsteady   Stairs             Wheelchair Mobility    Modified Rankin (Stroke Patients Only) Modified Rankin (Stroke Patients Only) Pre-Morbid Rankin Score: No symptoms Modified Rankin: Severe disability     Balance Overall balance assessment: Needs assistance Sitting-balance support: Feet supported Sitting balance-Leahy Scale: Poor Sitting balance - Comments: requires mod assist to maintain sitting balance, generally floppy Postural control: Posterior lean;Left lateral lean Standing balance support: Bilateral upper extremity supported;During functional activity Standing balance-Leahy Scale: Poor                              Cognition Arousal/Alertness: Awake/alert Behavior During Therapy: Flat affect Overall Cognitive Status: No family/caregiver present to determine baseline cognitive functioning                                 General Comments: lethargic      Exercises  General Comments        Pertinent Vitals/Pain Pain Assessment: Faces Faces Pain Scale: Hurts little more Pain Location: left hip Pain Descriptors / Indicators: Grimacing;Guarding;Sore Pain Intervention(s): Limited activity within patient's tolerance;Monitored during session;Repositioned    Home Living                      Prior Function            PT Goals (current goals can now be  found in the care plan section) Acute Rehab PT Goals Patient Stated Goal: to get back to her normal PT Goal Formulation: With patient Time For Goal Achievement: 11/05/19 Potential to Achieve Goals: Fair Progress towards PT goals: Progressing toward goals    Frequency    Min 4X/week      PT Plan Current plan remains appropriate    Co-evaluation              AM-PAC PT "6 Clicks" Mobility   Outcome Measure  Help needed turning from your back to your side while in a flat bed without using bedrails?: A Lot Help needed moving from lying on your back to sitting on the side of a flat bed without using bedrails?: A Lot Help needed moving to and from a bed to a chair (including a wheelchair)?: A Lot Help needed standing up from a chair using your arms (e.g., wheelchair or bedside chair)?: A Lot Help needed to walk in hospital room?: A Lot Help needed climbing 3-5 steps with a railing? : Total 6 Click Score: 11    End of Session Equipment Utilized During Treatment: Gait belt;Back brace Activity Tolerance: Patient limited by fatigue;Patient limited by lethargy Patient left: in chair;with call bell/phone within reach Nurse Communication: Mobility status;Need for lift equipment PT Visit Diagnosis: Muscle weakness (generalized) (M62.81);Unsteadiness on feet (R26.81);Other abnormalities of gait and mobility (R26.89) Pain - Right/Left: Left Pain - part of body: Hip     Time: 0900-0929 PT Time Calculation (min) (ACUTE ONLY): 29 min  Charges:  $Gait Training: 8-22 mins $Therapeutic Activity: 8-22 mins                     Zuriah Bordas, PT, GCS 10/25/19,11:05 AM

## 2019-10-25 NOTE — Progress Notes (Signed)
    [facility] has been requested to perform a transesophageal echocardiogram on 10/26/19 for Stroke.  After careful review of history and examination, the risks and benefits of transesophageal echocardiogram have been explained including risks of esophageal damage, perforation (1:10,000 risk), bleeding, pharyngeal hematoma as well as other potential complications associated with conscious sedation including aspiration, arrhythmia, respiratory failure and death. Alternatives to treatment were discussed, questions were answered. Patient is willing to proceed. Of note her Hgb is 7.4 today, slightly below her baseline of 8-8.5. Will need to recheck CBC in the morning.  Reino Bellis, NP-C 10/25/2019 3:40 PM

## 2019-10-25 NOTE — Progress Notes (Signed)
PROGRESS NOTE  [name] [id number] DOB: 1951/07/09 DOA: 10/20/2019 PCP: Manon Hilding, MD  Brief History   [name] a 68 y.o.femalewith medical history ofESRD (MWF), diabetes mellitus type 2, stroke, hypertension, hyperlipidemia presented with left-sided weakness,  when she woke up to get ready for dialysis at 4 AM on 10/20/2019, the patient noted left-sided weakness and had difficulty ambulating. Shw was found to have right basal ganglia infarcts on MRI. Her hospitalization complicated by fevers of 101, and finding of  right IJ DVT on Dopplers. She is now found to have recurrent Pseudomonas bacteremia. HD catheter was removed on 10/23/2019.   Consultants  . Infectious disease . Nephrology . Neurology  Procedures  . Dialysis  Antibiotics   Anti-infectives (From admission, onward)   Start     Dose/Rate Route Frequency Ordered Stop   10/24/19 2000  ceFEPIme (MAXIPIME) 1 g in sodium chloride 0.9 % 100 mL IVPB     1 g 200 mL/hr over 30 Minutes Intravenous Every 24 hours 10/23/19 1100     10/23/19 1200  ceFEPIme (MAXIPIME) 2 g in sodium chloride 0.9 % 100 mL IVPB     2 g 200 mL/hr over 30 Minutes Intravenous Every Mon (Hemodialysis) 10/23/19 1044 10/23/19 1217   10/23/19 0745  ceFEPIme (MAXIPIME) 2 g in sodium chloride 0.9 % 100 mL IVPB  Status:  Discontinued     2 g 200 mL/hr over 30 Minutes Intravenous  Once 10/23/19 0736 10/23/19 1115    .  Subjective  The patient is resting comfortably. No new complaints.  Objective   Vitals:  Vitals:   10/25/19 1145 10/25/19 1600  BP: (!) 148/84 130/81  Pulse: 89 87  Resp: 19 18  Temp: 99 F (37.2 C) 98.9 F (37.2 C)  SpO2:  97%   Exam:  Constitutional:  . The patient is awake, alert, and oriented x 3. No acute distress. Eyes:  . pupils and irises appear normal . Normal lids and conjunctivae ENMT:  . grossly normal hearing  . Lips appear normal . external ears, nose appear normal . Oropharynx:  mucosa, tongue,posterior pharynx appear normal Neck:  . neck appears normal, no masses, normal ROM, supple . no thyromegaly Respiratory:  . No increased work of breathing. . No wheezes, rales, or rhonchi . No tactile fremitus Cardiovascular:  . Regular rate and rhythm . No murmurs, ectopy, or gallups. . No lateral PMI. No thrills. Abdomen:  . Abdomen is soft, non-tender, non-distended . No hernias, masses, or organomegaly . Normoactive bowel sounds.  Musculoskeletal:  . No cyanosis, clubbing, or edema Skin:  . No rashes, lesions, ulcers . palpation of skin: no induration or nodules Neurologic:  . CN 2-12 intact . Sensation all 4 extremities intact Psychiatric:  . Mental status o Mood, affect appropriate o Orientation to person, place, time  . judgment and insight appear intact   I have personally reviewed the following:   Today's Data  . Vitals, BMP, CBC, troponins .  Micro Data  . Blood cultures: 2/2 positive for pseudomonas aeruginosa . Surveillance cultures 2/2 (10/24/2019) Have had no growth.  Imaging  . MRI left hip:  o No findings suspicious for osteomyelitis or septic arthritis o Edema of the paraspinal and psoas muscles o osteodystrophy  Scheduled Meds: . acetaminophen  1,000 mg Oral Once  . calcium carbonate  1 tablet Oral 2 times per day  . carvedilol  6.25 mg Oral BID  . Chlorhexidine Gluconate Cloth  6 each Topical Q0600  .  doxercalciferol  1.5 mcg Intravenous Q M,W,F-HD  . gabapentin  100 mg Oral QHS  . insulin aspart  0-5 Units Subcutaneous QHS  . insulin aspart  0-9 Units Subcutaneous TID WC  . isosorbide mononitrate  30 mg Oral QPC lunch  . multivitamin  1 tablet Oral QPC lunch   Continuous Infusions: . sodium chloride Stopped (10/20/19 1317)  . ceFEPime (MAXIPIME) IV 1 g (10/24/19 2145)  . heparin 1,150 Units/hr (10/24/19 2144)    Active Problems:   ESRD needing dialysis ([place])   Anemia of chronic renal failure   Type 2 diabetes  mellitus with diabetic nephropathy (HCC)   Bacteremia due to Pseudomonas   Acute ischemic stroke ([place])   Acute left-sided weakness   LOS: 5 days   A & P  Acute ischemic stroke -MRI notes right basal ganglia infarcts, continues to have significant left-sided deficits -2D echocardiogram with preserved EF, no cardiac source of embolus -Carotid duplex is unremarkable, no PFO noted on TCD bubble study -Patient was on aspirin and Plavix at baseline which was continued however given concomitant new finding of acute DVT in her right IJ she was started on IV heparin without bolus, after discussion with neurology Dr. Erlinda Hong -Now given bacteremia, endocarditis may be a consideration as well, requested TEE, per [facility], planned for Thursday 12/3 -Aspirin and Plavix discontinued 11/29 -Hemoglobin A1c is 7.6, LDL is 65 -Neurology consulting -PT OT, SLP evaluation completed, CIR recommended -CIR consulted  Recurrent Pseudomonas bacteremia -Likely secondary to HD catheter, previously treated with 6-week course of IV Tressie Ellis which was completed 10/15, previous HD catheter was removed and she had a new right IJ tunneled catheter placed then -After discussion with nephrology and infectious disease, HD catheter was removed 11/30 in IR for line holiday, repeat blood cultures today -Appreciate infectious disease consult -Continue IV cefepime day 2 -Discussed with [facility] heart care for TEE, plan for Thursday 12/3 -Patient also complains of pain and discomfort in her left hip since yesterday, will obtain an MRI of this area given recurrent bacteremia -Back in the end of August when she had Pseudomonas bacteremia and there was concern for possible discitis of L3-L4, this was later felt to be renal spondyloarthropathy on repeat imaging  Right IJ acute DVT -This is secondary to her HD catheter, discussed with renal, given concurrent bacteremia will need HD catheter removal -Treat with anticoagulation for 3 months after  catheter removal, given ESRD options are limited,continue IV heparin without bolus and aim for a lower PTT, hold off on Coumadin until new dialysis access issues sorted out  ESRD -Nephrology consulted, usually dialyzes Monday Wednesday Friday -Right IJ HD catheter removed 11/30, last HD was yesterday 11/30  Diabetes mellitus type 2 -Hemoglobin A1c is 7.6 -NovoLog sliding scale  Essential hypertension -Restart Coreg, amlodipine on hold -hydralazine prn SBP >220  Anemia of chronic disease -Per renal  DVT prophylaxis: Heparin IV Code Status: DNR Family Communication: no family at bedside Disposition Plan: Possibly CIR when above work-up completed  Jennifr Gaeta, DO [facility] Direct contact: see [email or web address]  7PM-7AM contact night coverage as above 10/25/2019, 6:15 PM  LOS: 5 days

## 2019-10-25 NOTE — Progress Notes (Signed)
Patient ID: [name], female   DOB: 01-04-1951, 68 y.o.   MRN: [id number] S: Feeling better O:BP (!) 148/84 (BP Location: Left Arm)   Pulse 89   Temp 99 F (37.2 C) (Oral)   Resp 19   Ht 5\' 7"  (1.702 m)   Wt 65.4 kg   SpO2 100%   BMI 22.58 kg/m   Intake/Output Summary (Last 24 hours) at 10/25/2019 1334 Last data filed at 10/25/2019 [id number] Gross per 24 hour  Intake 823.5 ml  Output -  Net 823.5 ml   Intake/Output: I/O last 3 completed shifts: In: 913.5 [P.O.:650; I.V.:163.5; IV Piggyback:100] Out: -   Intake/Output this shift:  No intake/output data recorded. Weight change: 2.1 kg Gen: NAD CVS: no rub Resp:cta Abd: benign Ext: no edema  Recent Labs  Lab 10/20/19 0659 10/21/19 0405 10/22/19 0735 10/23/19 0640 10/24/19 0419 10/25/19 0314  NA 139 140 138 136 136 137  K 4.4 4.9 4.8 4.8 4.6 4.8  CL 97* 100 98 98 98 98  CO2 23 24 22 22 24  21*  GLUCOSE 139* 131* 148* 174* 104* 113*  BUN 56* 62* 74* 87* 40* 53*  CREATININE 7.76* 8.86* 10.27* 12.23* 7.33* 9.14*  ALBUMIN 2.8* 2.1* 2.3* 1.9* 1.8* 1.9*  CALCIUM 8.7* 8.4* 8.5* 8.1* 8.2* 8.4*  PHOS  --  4.4 4.3 4.9* 4.9* 5.5*  AST 12*  --   --   --   --   --   ALT 16  --   --   --   --   --    Liver Function Tests: Recent Labs  Lab 10/20/19 0659  10/23/19 0640 10/24/19 0419 10/25/19 0314  AST 12*  --   --   --   --   ALT 16  --   --   --   --   ALKPHOS 75  --   --   --   --   BILITOT 0.7  --   --   --   --   PROT 7.9  --   --   --   --   ALBUMIN 2.8*   < > 1.9* 1.8* 1.9*   < > = values in this interval not displayed.   No results for input(s): LIPASE, AMYLASE in the last 168 hours. No results for input(s): AMMONIA in the last 168 hours. CBC: Recent Labs  Lab 10/20/19 0659 10/21/19 0405 10/22/19 0735 10/23/19 0640 10/24/19 0419 10/25/19 0314  WBC 15.7* 10.9* 13.3* 11.1* 10.6* 10.2  NEUTROABS 12.4*  --   --   --   --   --   HGB 9.7* 8.6* 9.6* 8.1* 8.4* 7.4*  HCT 32.8* 27.4* 30.0* 25.5* 26.5* 23.4*   MCV 91.6 88.4 88.2 87.9 89.2 90.7  PLT 348 274 274 210 215 200   Cardiac Enzymes: No results for input(s): CKTOTAL, CKMB, CKMBINDEX, TROPONINI in the last 168 hours. CBG: Recent Labs  Lab 10/24/19 1728 10/24/19 2124 10/25/19 0615 10/25/19 0817 10/25/19 1224  GLUCAP 142* 188* 192* 168* 119*    Iron Studies: No results for input(s): IRON, TIBC, TRANSFERRIN, FERRITIN in the last 72 hours. Studies/Results: Mr Hip Left Wo Contrast  Result Date: 10/25/2019 CLINICAL DATA:  Chronic left hip pain.  Pseudomonas bacteremia. EXAM: MR OF THE LEFT HIP WITHOUT CONTRAST TECHNIQUE: Multiplanar, multisequence MR imaging was performed. No intravenous contrast was administered. COMPARISON:  Prior MRI lumbar spines and CT scan abdomen/pelvis 09/10/2019 FINDINGS: Diffuse marrow abnormality as demonstrated on prior CT and  MR examinations most likely due to renal osteodystrophy. This is most notable in the spine. I do not see any findings suspicious for osteomyelitis. The pubic symphysis and SI joints are intact without findings suspicious for septic arthritis. Both hips are normally located. No stress fracture or AVN. Mild bilateral hip joint degenerative changes. Diffuse edema like signal changes involving the paraspinal and psoas muscles, likely nonspecific myositis. I do not see any muscle tears or muscle lesions. The hamstring tendons are intact. There is mild bilateral hip peritendinitis but no findings for trochanteric bursitis. No significant intrapelvic abnormalities are identified. No mass or adenopathy. IMPRESSION: 1. Diffuse marrow abnormality likely due to renal osteodystrophy. No worrisome bone lesions or findings suspicious for osteomyelitis or septic arthritis. 2. Nonspecific edema like signal changes in the paraspinal and psoas muscles. 3. No significant intrapelvic abnormalities. Electronically Signed   By: Marijo Sanes M.D.   On: 10/25/2019 07:39   Ir Removal Tun Cv Cath W/o Fl  Result Date:  10/24/2019 INDICATION: Patient with history of ESRD on HD s/p tunneled right IJ HD catheter placement in OR 07/25/2019 by Dr. Constance Haw. Patient currently admitted to [facility] and found to have recurrence Pseudomonas bacteremia. Request is made for removal of tunneled hemodialysis catheter for line holiday. EXAM: REMOVAL OF TUNNELED HEMODIALYSIS CATHETER MEDICATIONS: None COMPLICATIONS: None immediate. PROCEDURE: Informed written consent was obtained from the patient following an explanation of the procedure, risks, benefits and alternatives to treatment. A time out was performed prior to the initiation of the procedure. Maximal barrier sterile technique was utilized including mask, sterile gloves, large sterile drape, hand hygiene, and Hibiclens. Utilizing gentle traction, the catheter was removed intact. Hemostasis was obtained with manual compression. A dressing was placed. The patient tolerated the procedure well without immediate post procedural complication. IMPRESSION: Successful removal of tunneled dialysis catheter. Read by: Earley Abide, PA-C Electronically Signed   By: Lucrezia Europe M.D.   On: 10/23/2019 16:01   . acetaminophen  1,000 mg Oral Once  . calcium carbonate  1 tablet Oral 2 times per day  . carvedilol  6.25 mg Oral BID  . Chlorhexidine Gluconate Cloth  6 each Topical Q0600  . doxercalciferol  1.5 mcg Intravenous Q M,W,F-HD  . gabapentin  100 mg Oral QHS  . insulin aspart  0-5 Units Subcutaneous QHS  . insulin aspart  0-9 Units Subcutaneous TID WC  . isosorbide mononitrate  30 mg Oral QPC lunch  . multivitamin  1 tablet Oral QPC lunch    BMET    Component Value Date/Time   NA 137 10/25/2019 0314   K 4.8 10/25/2019 0314   CL 98 10/25/2019 0314   CO2 21 (L) 10/25/2019 0314   GLUCOSE 113 (H) 10/25/2019 0314   BUN 53 (H) 10/25/2019 0314   CREATININE 9.14 (H) 10/25/2019 0314   CALCIUM 8.4 (L) 10/25/2019 0314   GFRNONAA 4 (L) 10/25/2019 0314   GFRAA 5 (L) 10/25/2019 0314   CBC     Component Value Date/Time   WBC 10.2 10/25/2019 0314   RBC 2.58 (L) 10/25/2019 0314   HGB 7.4 (L) 10/25/2019 0314   HCT 23.4 (L) 10/25/2019 0314   PLT 200 10/25/2019 0314   MCV 90.7 10/25/2019 0314   MCH 28.7 10/25/2019 0314   MCHC 31.6 10/25/2019 0314   RDW 17.4 (H) 10/25/2019 0314   LYMPHSABS 2.1 10/20/2019 0659   MONOABS 1.0 10/20/2019 0659   EOSABS 0.0 10/20/2019 0659   BASOSABS 0.0 10/20/2019 [id number]  Assessment/ Plan:   1. Recurrent Pseudomonas bacteremia- unclear source.  S/p removal of [facility] 10/23/19.  Appreciate ID consult who recommended TEE to r/o endocarditis.  Also has RIJ thrombus so septic thrombophlebitis also possible.  Recommended 6 weeks of abx and repeat culture today since HD cath removed.   1. I have reconsulted IR for replacement of HD catheter tomorrow.  If still febrile would place temp HD cath, but if cultures negative and temp normal can place TDC 2. Continue to follow blood cultures drawn since [facility] removed. 2. Acute ischemic CVA- MRI revealed R basal ganglia infarcts and she has left-sided deficits.  Neuro following.  CIR recommended. 3. ESRD normally MWF but may need to wait until Thursday for HD since [facility] removed for "line holiday" with recurrent pseudomonas bacteremia.  Will get back on regular schedule Monday  4. Anemia: stable 5. CKD-MBD: cont with homemeds 6. Nutrition: renal diet 7. Hypertension: stable   Donetta Potts, MD [employer] [phone]

## 2019-10-26 ENCOUNTER — Encounter (HOSPITAL_COMMUNITY): Payer: Self-pay | Admitting: Emergency Medicine

## 2019-10-26 ENCOUNTER — Encounter (HOSPITAL_COMMUNITY)
Admission: EM | Disposition: A | Discharge status: Rehabilitation Facility | Payer: Self-pay | Source: Home / Self Care | Attending: Internal Medicine

## 2019-10-26 ENCOUNTER — Inpatient Hospital Stay (HOSPITAL_COMMUNITY): Payer: Medicare PPO

## 2019-10-26 DIAGNOSIS — I330 Acute and subacute infective endocarditis: Secondary | ICD-10-CM | POA: Diagnosis not present

## 2019-10-26 DIAGNOSIS — R011 Cardiac murmur, unspecified: Secondary | ICD-10-CM

## 2019-10-26 DIAGNOSIS — I361 Nonrheumatic tricuspid (valve) insufficiency: Secondary | ICD-10-CM

## 2019-10-26 DIAGNOSIS — M545 Low back pain: Secondary | ICD-10-CM

## 2019-10-26 DIAGNOSIS — I340 Nonrheumatic mitral (valve) insufficiency: Secondary | ICD-10-CM | POA: Diagnosis not present

## 2019-10-26 HISTORY — PX: TEE WITHOUT CARDIOVERSION: SHX5443

## 2019-10-26 HISTORY — PX: IR FLUORO GUIDE CV LINE LEFT: IMG2282

## 2019-10-26 HISTORY — PX: IR US GUIDE VASC ACCESS LEFT: IMG2389

## 2019-10-26 LAB — GLUCOSE, CAPILLARY
Glucose-Capillary: 109 mg/dL — ABNORMAL HIGH (ref 70–99)
Glucose-Capillary: 123 mg/dL — ABNORMAL HIGH (ref 70–99)
Glucose-Capillary: 142 mg/dL — ABNORMAL HIGH (ref 70–99)

## 2019-10-26 LAB — CBC
HCT: 24.5 % — ABNORMAL LOW (ref 36.0–46.0)
Hemoglobin: 8.2 g/dL — ABNORMAL LOW (ref 12.0–15.0)
MCH: 29.6 pg (ref 26.0–34.0)
MCHC: 33.5 g/dL (ref 30.0–36.0)
MCV: 88.4 fL (ref 80.0–100.0)
Platelets: 216 10*3/uL (ref 150–400)
RBC: 2.77 MIL/uL — ABNORMAL LOW (ref 3.87–5.11)
RDW: 16.9 % — ABNORMAL HIGH (ref 11.5–15.5)
WBC: 9.9 10*3/uL (ref 4.0–10.5)
nRBC: 0 % (ref 0.0–0.2)

## 2019-10-26 LAB — RENAL FUNCTION PANEL
Albumin: 1.8 g/dL — ABNORMAL LOW (ref 3.5–5.0)
Anion gap: 17 — ABNORMAL HIGH (ref 5–15)
BUN: 68 mg/dL — ABNORMAL HIGH (ref 8–23)
CO2: 23 mmol/L (ref 22–32)
Calcium: 8.1 mg/dL — ABNORMAL LOW (ref 8.9–10.3)
Chloride: 97 mmol/L — ABNORMAL LOW (ref 98–111)
Creatinine, Ser: 10.57 mg/dL — ABNORMAL HIGH (ref 0.44–1.00)
GFR calc Af Amer: 4 mL/min — ABNORMAL LOW (ref >60)
GFR calc non Af Amer: 3 mL/min — ABNORMAL LOW (ref >60)
Glucose, Bld: 111 mg/dL — ABNORMAL HIGH (ref 70–99)
Phosphorus: 6.4 mg/dL — ABNORMAL HIGH (ref 2.5–4.6)
Potassium: 4.9 mmol/L (ref 3.5–5.1)
Sodium: 137 mmol/L (ref 135–145)

## 2019-10-26 LAB — HEPARIN LEVEL (UNFRACTIONATED): Heparin Unfractionated: 0.35 IU/mL (ref 0.30–0.70)

## 2019-10-26 SURGERY — ECHOCARDIOGRAM, TRANSESOPHAGEAL
Anesthesia: Moderate Sedation

## 2019-10-26 MED ORDER — CIPROFLOXACIN HCL 500 MG PO TABS
500.0000 mg | ORAL_TABLET | Freq: Every day | ORAL | Status: DC
  Administered 2019-10-26 – 2019-10-31 (×6): 500 mg via ORAL
  Filled 2019-10-26 (×7): qty 1

## 2019-10-26 MED ORDER — FENTANYL CITRATE (PF) 100 MCG/2ML IJ SOLN
INTRAMUSCULAR | Status: DC | PRN
  Administered 2019-10-26 (×2): 25 ug via INTRAVENOUS

## 2019-10-26 MED ORDER — LIDOCAINE-EPINEPHRINE 1 %-1:100000 IJ SOLN
INTRAMUSCULAR | Status: AC | PRN
  Administered 2019-10-26: 20 mL via INTRADERMAL

## 2019-10-26 MED ORDER — CHLORHEXIDINE GLUCONATE 4 % EX LIQD
CUTANEOUS | Status: AC
  Filled 2019-10-26: qty 15

## 2019-10-26 MED ORDER — FENTANYL CITRATE (PF) 100 MCG/2ML IJ SOLN
INTRAMUSCULAR | Status: AC
  Filled 2019-10-26: qty 2

## 2019-10-26 MED ORDER — MIDAZOLAM HCL 2 MG/2ML IJ SOLN
INTRAMUSCULAR | Status: AC
  Filled 2019-10-26: qty 2

## 2019-10-26 MED ORDER — MIDAZOLAM HCL (PF) 5 MG/ML IJ SOLN
INTRAMUSCULAR | Status: AC
  Filled 2019-10-26: qty 2

## 2019-10-26 MED ORDER — HEPARIN SODIUM (PORCINE) 1000 UNIT/ML IJ SOLN
INTRAMUSCULAR | Status: AC
  Filled 2019-10-26: qty 1

## 2019-10-26 MED ORDER — MIDAZOLAM HCL (PF) 10 MG/2ML IJ SOLN
INTRAMUSCULAR | Status: DC | PRN
  Administered 2019-10-26 (×2): 2 mg via INTRAVENOUS

## 2019-10-26 MED ORDER — DIPHENHYDRAMINE HCL 50 MG/ML IJ SOLN
INTRAMUSCULAR | Status: AC
  Filled 2019-10-26: qty 1

## 2019-10-26 MED ORDER — BUTAMBEN-TETRACAINE-BENZOCAINE 2-2-14 % EX AERO
INHALATION_SPRAY | CUTANEOUS | Status: DC | PRN
  Administered 2019-10-26: 2 via TOPICAL

## 2019-10-26 MED ORDER — CEFAZOLIN SODIUM-DEXTROSE 2-4 GM/100ML-% IV SOLN
INTRAVENOUS | Status: AC
  Administered 2019-10-26: 2000 mg
  Filled 2019-10-26: qty 100

## 2019-10-26 MED ORDER — FENTANYL CITRATE (PF) 100 MCG/2ML IJ SOLN
INTRAMUSCULAR | Status: AC | PRN
  Administered 2019-10-26: 50 ug via INTRAVENOUS

## 2019-10-26 MED ORDER — ENSURE ENLIVE PO LIQD
237.0000 mL | Freq: Two times a day (BID) | ORAL | Status: DC
  Administered 2019-10-27 – 2019-11-01 (×7): 237 mL via ORAL

## 2019-10-26 MED ORDER — MIDAZOLAM HCL 2 MG/2ML IJ SOLN
INTRAMUSCULAR | Status: AC | PRN
  Administered 2019-10-26: 1 mg via INTRAVENOUS

## 2019-10-26 NOTE — Progress Notes (Signed)
Echocardiogram Echocardiogram Transesophageal has been performed.  Oneal Deputy Khristopher Kapaun 10/26/2019, 9:07 AM

## 2019-10-26 NOTE — Plan of Care (Signed)
Patient progressing towards plan of care goals. 

## 2019-10-26 NOTE — Progress Notes (Signed)
    Transesophageal Echocardiogram Note  [name] [id number] 09-Aug-1951  Procedure: Transesophageal Echocardiogram Indications: bacteremia  Procedure Details Consent: Obtained Time Out: Verified patient identification, verified procedure, site/side was marked, verified correct patient position, special equipment/implants available, Radiology Safety Procedures followed,  medications/allergies/relevent history reviewed, required imaging and test results available.  Performed  Medications:  During this procedure the patient is administered a total of Versed 4 mg and Fentanyl 50 mcg  to achieve and maintain moderate conscious sedation.  The patient's heart rate, blood pressure, and oxygen saturation are monitored continuously during the procedure. The period of conscious sedation is 30 minutes, of which I was present face-to-face 100% of this time.  Normal LV function; trace AI; large vegetation on anterior MV leaflet; moderate, eccentric, posterolaterally directed MR; mild TR.   Complications: No apparent complications Patient did tolerate procedure well.  Kirk Ruths, MD

## 2019-10-26 NOTE — Interval H&P Note (Signed)
History and Physical Interval Note:  10/26/2019 8:10 AM  [name]  has presented today for surgery, with the diagnosis of stroke.  The various methods of treatment have been discussed with the patient and family. After consideration of risks, benefits and other options for treatment, the patient has consented to  Procedure(s): TRANSESOPHAGEAL ECHOCARDIOGRAM (TEE) (N/A) as a surgical intervention.  The patient's history has been reviewed, patient examined, no change in status, stable for surgery.  I have reviewed the patient's chart and labs.  Questions were answered to the patient's satisfaction.     Kirk Ruths

## 2019-10-26 NOTE — Procedures (Signed)
Pre-procedure Diagnosis: ESRD Post-procedure Diagnosis: Same  Successful placement of tunneled HD catheter with tips terminating within the superior aspect of the right atrium.    Complications: None Immediate  EBL: Minimal   The catheter is ready for immediate use.   Jay Leigha Olberding, MD Pager #: [phone]   

## 2019-10-26 NOTE — Consult Note (Addendum)
Cardiology Consultation:   Patient ID: Dekeisha Casablanca MRN: [id number]; DOB: May 11, 1951  Admit date: 10/20/2019 Date of Consult: 10/26/2019  Primary Care Provider: Manon Hilding, MD Primary Cardiologist: No primary care provider on file.  Primary Electrophysiologist:  None    Patient Profile:   Mariaguadalupe Milles is a 68 y.o. female with a hx of ESRD on HD with multiple access procedure sites in her arms which have occluded, DM2, HTN, HLD who was admitted for stroke and fevers who is being evaluated for MV vegetation found on TEE at the request of Dr. Benny Lennert.  History of Present Illness:   Ms. Parchment has not seen [facility] in the past. No known history of CAD, MI, stent, heart failure, arrhythmia. She has been on amlodipine 10 mg, Imdur 30 mg,  and Coreg 6.25mg  for BP management. Patient was not on medications for Hyperlipidemia. She has a history of ESRD on HD (WMF). She has had multiple failed access sites, placed on Aspirin and Plavix. She was found to have pseudomonas bacteremia in August 2020 2/2 to left IJ cathter site, which was ultimately removed. Right IJ site was established for HD. She underwent 6 weeks abx treatment.   On 10/20/19 the patient woke up for her regular dialysis session. Upon waking up she noted left sided weakness and had difficulty ambulating. EMS was called and she was brought to the ED at [facility]. In the ED WBC was 15.7. Hgb 9.7 and platelets 240,000. She endorsed compliance with all her medications (aspirin and plavix). CT head showed an acute right basal ganglia infarct. Neurology was consulted and transferred the patient to [facility]. Nephrology was consulted for maintenance dialysis. Patent was outside the window for tPA.   Once at [facility] MRI head was ordered showing acute right basal ganglia infarcts. MR angiogram was unremarkable. UE dopplers showed acute right IJ and age-indeterminate brachial vein DVT. IV heparin was started. TCD Bubble  study was negative. 11/27 echo showed preserved EF, 60-65% with G1DD, mild LVH, and mild LAE. 11/29 patient was noted febrile and blood cultures grew pseudomonas. She was started in cefepime and ID was consulted. Right IJ catheter was removed 11/30. With persistent fevers the patient underwent TEE for possible endocarditis. TEE showed large MV vegetation with moderate MR and cardiology was consulted   Patient denies alcohol/tobacco/drug use. Family history positive for possible heart failure in her mother. She denies history of chest pain.   Heart Pathway Score:     Past Medical History:  Diagnosis Date   Arthritis    hands   Constipation    Diabetes mellitus without complication (HCC)    Type II   ESRD (end stage renal disease) ([place])     M/W/F- Hemodialysis   GERD (gastroesophageal reflux disease)    Headache    Hyperlipidemia    Hypertension    Irregular heart rate    Stroke ([place])    TIA - approx 2010- no residual    Past Surgical History:  Procedure Laterality Date   ABDOMINAL HYSTERECTOMY     AORTIC ARCH ANGIOGRAPHY N/A 03/28/2019   Procedure: AORTIC ARCH ANGIOGRAPHY;  Surgeon: Waynetta Sandy, MD;  Location: [place] CV LAB;  Service: Cardiovascular;  Laterality: N/A;   AV FISTULA PLACEMENT Left 06/13/2018   Procedure: ARTERIOVENOUS (AV) FISTULA CREATION;  Surgeon: Rosetta Posner, MD;  Location: [facility] OR;  Service: Vascular;  Laterality: Left;   AV FISTULA PLACEMENT Left 08/18/2018   Procedure: INSERTION OF 4-7MM X 45CM  ARTERIOVENOUS (AV) GORE-TEX GRAFT LEFT  FOREARM;  Surgeon: Rosetta Posner, MD;  Location: [place];  Service: Vascular;  Laterality: Left;   AV FISTULA PLACEMENT Right 04/06/2019   Procedure: INSERTION OF ARTERIOVENOUS (AV) GORE-TEX GRAFT RIGHT UPPER ARM;  Surgeon: Waynetta Sandy, MD;  Location: [place];  Service: Vascular;  Laterality: Right;   AV FISTULA PLACEMENT Right 04/13/2019   Procedure: INSERTION OF ARTERIOVENOUS (AV) BOVINE   ARTEGRAFT GRAFT RIGHT UPPER EXTREMITY;  Surgeon: Serafina Mitchell, MD;  Location: [place];  Service: Vascular;  Laterality: Right;   [place] REMOVAL Right 05/16/2019   Procedure: REMOVAL OF ARTERIOVENOUS GORETEX GRAFT ([place]) RIGHT ARM;  Surgeon: Angelia Mould, MD;  Location: [place];  Service: Vascular;  Laterality: Right;   [place] Right 03/07/2019   Procedure: First Stage Bascilic Vein Transposition Right Arm;  Surgeon: Serafina Mitchell, MD;  Location: [place];  Service: Vascular;  Laterality: Right;   CATARACT EXTRACTION Right 2005   INSERTION OF DIALYSIS CATHETER N/A 08/18/2018   Procedure: INSERTION OF DIALYSIS CATHETER;  Surgeon: Rosetta Posner, MD;  Location: [facility] OR;  Service: Vascular;  Laterality: N/A;   INSERTION OF DIALYSIS CATHETER Right 07/25/2019   Procedure: INSERTION OF DIALYSIS CATHETER RIGHT INTERNAL JUGULAR (TUNNELED);  Surgeon: Virl Cagey, MD;  Location: [facility];  Service: General;  Laterality: Right;   IR FLUORO GUIDE CV LINE RIGHT  12/06/2018   IR PTA ADDL CENTRAL DIALYSIS SEG THRU DIALY CIRCUIT RIGHT Right 12/06/2018   IR REMOVAL TUN CV CATH W/O FL  10/23/2019   THROMBECTOMY AND REVISION OF ARTERIOVENTOUS (AV) GORETEX  GRAFT Left 09/29/2018   Procedure: INSERTION OF ARTERIOVENTOUS (AV) GORETEX  GRAFT ARM;  Surgeon: Waynetta Sandy, MD;  Location: [place];  Service: Vascular;  Laterality: Left;   UPPER EXTREMITY ANGIOGRAPHY Right 03/28/2019   Procedure: UPPER EXTREMITY ANGIOGRAPHY;  Surgeon: Waynetta Sandy, MD;  Location: [place] CV [facility];  Service: Cardiovascular;  Laterality: Right;   VENOGRAM  10/27/2018   Procedure: Venogram;  Surgeon: Marty Heck, MD;  Location: [place] [facility];  Service: Cardiovascular;;  bilateral arm     Home Medications:  Prior to Admission medications   Medication Sig Start Date End Date Taking? Authorizing Provider  Accu-Chek Softclix Lancets lancets  08/17/19  Yes [provider]  Alcohol Swabs (B-D SINGLE USE SWABS REGULAR) PADS  08/07/19  Yes [provider]  amLODipine (NORVASC) 10 MG tablet Take 10 mg by mouth daily after lunch.    Yes [provider]  aspirin EC 81 MG tablet Take 81 mg by mouth daily after lunch.    Yes [provider]  calcium carbonate (TUMS - DOSED IN MG ELEMENTAL CALCIUM) 500 MG chewable tablet Chew 1 tablet by mouth 2 (two) times daily. With lunch & supper   Yes [provider]  carvedilol (COREG) 6.25 MG tablet Take 6.25 mg by mouth 2 (two) times daily. 02/01/19  Yes [provider]  clopidogrel (PLAVIX) 75 MG tablet Take 1 tablet (75 mg total) by mouth daily. 04/13/19  Yes Serafina Mitchell, MD  fluticasone (FLONASE) 50 MCG/ACT nasal spray Place 1 spray into both nostrils daily as needed for allergies or rhinitis.    Yes [provider]  gabapentin (NEURONTIN) 100 MG capsule Take 1 capsule (100 mg total) by mouth at bedtime. 09/26/19  Yes Carlyle Basques, MD  insulin NPH-regular Human (NOVOLIN 70/30) (70-30) 100 UNIT/ML injection Inject 10-20 Units into the  skin See admin instructions. Use twice daily per sliding scale: Blood sugar less than 150=12 units & Blood sugar 200 or more=20 units   Yes [provider]  methocarbamol (ROBAXIN) 500 MG tablet Take 1 tablet by mouth 3 (three) times daily as needed. 07/10/19  Yes [provider]  multivitamin (RENA-VIT) TABS tablet Take 1 tablet by mouth daily after lunch.  03/31/19  Yes [provider]  pantoprazole (PROTONIX) 40 MG tablet Take 1 tablet by mouth daily as needed.    Yes [provider]  Polyethylene Glycol 3350 (PEG 3350) 17 GM/SCOOP POWD Take 17 g by mouth daily as needed.  07/10/19  Yes [provider]  traMADol (ULTRAM) 50 MG tablet Take 50 mg by mouth 4 (four) times daily as needed. 10/09/19  Yes [provider]  isosorbide mononitrate (IMDUR) 30 MG 24 hr tablet Take 30 mg by mouth daily  after lunch.     [provider]  predniSONE (DELTASONE) 20 MG tablet Take 20-40 mg by mouth as directed. Take 40mg  by mouth daily for 7 days, then takes 20mg  by mouth daily for 7 days, then stop 10/09/19   [provider]    Inpatient Medications: Scheduled Meds:  acetaminophen  1,000 mg Oral Once   calcium carbonate  1 tablet Oral 2 times per day   carvedilol  6.25 mg Oral BID   Chlorhexidine Gluconate Cloth  6 each Topical Q0600   ciprofloxacin  500 mg Oral q1800   doxercalciferol  1.5 mcg Intravenous Q M,W,F-HD   feeding supplement (ENSURE ENLIVE)  237 mL Oral BID BM   gabapentin  100 mg Oral QHS   insulin aspart  0-5 Units Subcutaneous QHS   insulin aspart  0-9 Units Subcutaneous TID WC   isosorbide mononitrate  30 mg Oral QPC lunch   multivitamin  1 tablet Oral QPC lunch   Continuous Infusions:  sodium chloride Stopped (10/20/19 1317)   ceFEPime (MAXIPIME) IV 1 g (10/25/19 1953)   heparin 1,150 Units/hr (10/25/19 2323)   PRN Meds: acetaminophen **OR** acetaminophen (TYLENOL) oral liquid 160 mg/5 mL **OR** acetaminophen, fluticasone, hydrALAZINE, ondansetron (ZOFRAN) IV, oxyCODONE, senna-docusate  Allergies:   No Known Allergies  Social History:   Social History   Socioeconomic History   Marital status: Single    Spouse name: Not on file   Number of children: Not on file   Years of education: Not on file   Highest education level: Not on file  Occupational History   Not on file  Social Needs   Financial resource strain: Not on file   Food insecurity    Worry: Not on file    Inability: Not on file   Transportation needs    Medical: Not on file    Non-medical: Not on file  Tobacco Use   Smoking status: Never Smoker   Smokeless tobacco: Never Used  Substance and Sexual Activity   Alcohol use: Never    Frequency: Never   Drug use: Never   Sexual activity: Not on file  Lifestyle   Physical activity    Days per  week: Not on file    Minutes per session: Not on file   Stress: Not on file  Relationships   Social connections    Talks on phone: Not on file    Gets together: Not on file    Attends religious service: Not on file    Active member of club or organization: Not on file  Attends meetings of clubs or organizations: Not on file    Relationship status: Not on file   Intimate partner violence    Fear of current or ex partner: Not on file    Emotionally abused: Not on file    Physically abused: Not on file    Forced sexual activity: Not on file  Other Topics Concern   Not on file  Social History Narrative   Not on file    Family History:   Family History  Problem Relation Age of Onset   Heart murmur Mother    Heart failure Mother    Diabetes Mother    Cirrhosis Father    Alcoholism Father      ROS:  Please see the history of present illness.  All other ROS reviewed and negative.     Physical Exam/Data:   Vitals:   10/26/19 0905 10/26/19 0911 10/26/19 0921 10/26/19 1056  BP: (!) 160/78 (!) 153/76 (!) 153/73 137/81  Pulse: 87 85 84 83  Resp: 19 18 17 15   Temp: 97.8 F (36.6 C)   98.4 F (36.9 C)  TempSrc: Temporal   Oral  SpO2: 96% 96% 95% 97%  Weight:      Height:        Intake/Output Summary (Last 24 hours) at 10/26/2019 1235 Last data filed at 10/26/2019 0800 Gross per 24 hour  Intake 621.48 ml  Output --  Net 621.48 ml   Last 3 Weights 10/25/2019 10/24/2019 10/23/2019  Weight (lbs) 144 lb 2.9 oz 145 lb 1 oz 138 lb 14.2 oz  Weight (kg) 65.4 kg 65.8 kg 63 kg     Body mass index is 22.58 kg/m.  General:  Well nourished, well developed, in no acute distress HEENT: normal Lymph: no adenopathy Neck: no JVD Endocrine:  No thryomegaly Vascular: No carotid bruits; FA pulses 2+ bilaterally without bruits  Cardiac:  normal S1, S2; RRR; systolic murmur  Lungs:  clear to auscultation bilaterally, no wheezing, rhonchi or rales  Abd: soft, nontender, no  hepatomegaly  Ext: no edema Musculoskeletal:  No deformities, left sided weakness; back brace Skin: warm and dry  Neuro:  CNs 2-12 intact, no focal abnormalities noted Psych:  Normal affect   EKG:  The EKG was personally reviewed and demonstrates:  11/27/20NSR, 83 bpm, PAC; biphasic p waves  Telemetry:  Telemetry was personally reviewed and demonstrates:  NSR, HR 90s, no other arrhythmias noted  Relevant CV Studies:   TEE 10/26/19  1. Left ventricular ejection fraction, by visual estimation, is 55 to 60%. The left ventricle has normal function. There is mildly increased left ventricular hypertrophy.  2. Global right ventricle has normal systolic function.The right ventricular size is normal.  3. Left atrial size was normal.  4. Right atrial size was normal.  5. Trivial pericardial effusion is present.  6. Large vegetation on the mitral valve.  7. The mitral valve is abnormal. Moderate mitral valve regurgitation.  8. The tricuspid valve is grossly normal. Tricuspid valve regurgitation is mild.  9. The aortic valve is tricuspid. Aortic valve regurgitation is trivial. Mild aortic valve sclerosis without stenosis. 10. The pulmonic valve was grossly normal. Pulmonic valve regurgitation is mild. 11. Moderate plaque invoving the descending aorta. 12. Normal LV systolic function; large vegetation on anterior MV leaflet with moderate, eccentric, posterolaterally directed MR; mild TR.  Echo 10/20/19  1. Left ventricular ejection fraction, by visual estimation, is 60 to 65%. The left ventricle has normal function. There is mildly increased  left ventricular hypertrophy.  2. Left ventricular diastolic parameters are consistent with Grade I diastolic dysfunction (impaired relaxation).  3. Global right ventricle has normal systolic function.The right ventricular size is normal.  4. Right atrial size was normal.  5. Trivial pericardial effusion is present.  6. Mild mitral annular calcification.  7.  The mitral valve is normal in structure. Trace mitral valve regurgitation. No evidence of mitral stenosis.  8. The tricuspid valve is normal in structure. Tricuspid valve regurgitation is mild.  9. The aortic valve is tricuspid. Aortic valve regurgitation is not visualized. Mild to moderate aortic valve sclerosis/calcification without any evidence of aortic stenosis. 10. The pulmonic valve was normal in structure. Pulmonic valve regurgitation is not visualized. 11. The inferior vena cava is normal in size with greater than 50% respiratory variability, suggesting right atrial pressure of 3 mmHg. 12. Normal LV systolic function; grade 1 diastolic dysfunction; mild LVH; mild LAE.  Laboratory Data:  High Sensitivity Troponin:   Recent Labs  Lab 10/25/19 1340 10/25/19 1540  TROPONINIHS 84* 73*     Chemistry Recent Labs  Lab 10/24/19 0419 10/25/19 0314 10/26/19 0353  NA 136 137 137  K 4.6 4.8 4.9  CL 98 98 97*  CO2 24 21* 23  GLUCOSE 104* 113* 111*  BUN 40* 53* 68*  CREATININE 7.33* 9.14* 10.57*  CALCIUM 8.2* 8.4* 8.1*  GFRNONAA 5* 4* 3*  GFRAA 6* 5* 4*  ANIONGAP 14 18* 17*    Recent Labs  Lab 10/20/19 0659  10/24/19 0419 10/25/19 0314 10/26/19 0353  PROT 7.9  --   --   --   --   ALBUMIN 2.8*   < > 1.8* 1.9* 1.8*  AST 12*  --   --   --   --   ALT 16  --   --   --   --   ALKPHOS 75  --   --   --   --   BILITOT 0.7  --   --   --   --    < > = values in this interval not displayed.   Hematology Recent Labs  Lab 10/24/19 0419 10/25/19 0314 10/26/19 0353  WBC 10.6* 10.2 9.9  RBC 2.97* 2.58* 2.77*  HGB 8.4* 7.4* 8.2*  HCT 26.5* 23.4* 24.5*  MCV 89.2 90.7 88.4  MCH 28.3 28.7 29.6  MCHC 31.7 31.6 33.5  RDW 17.6* 17.4* 16.9*  PLT 215 200 216   BNPNo results for input(s): BNP, PROBNP in the last 168 hours.  DDimer No results for input(s): DDIMER in the last 168 hours.   Radiology/Studies:  Mr Hip Left Wo Contrast  Result Date: 10/25/2019 CLINICAL DATA:  Chronic  left hip pain.  Pseudomonas bacteremia. EXAM: MR OF THE LEFT HIP WITHOUT CONTRAST TECHNIQUE: Multiplanar, multisequence MR imaging was performed. No intravenous contrast was administered. COMPARISON:  Prior MRI lumbar spines and CT scan abdomen/pelvis 09/10/2019 FINDINGS: Diffuse marrow abnormality as demonstrated on prior CT and MR examinations most likely due to renal osteodystrophy. This is most notable in the spine. I do not see any findings suspicious for osteomyelitis. The pubic symphysis and SI joints are intact without findings suspicious for septic arthritis. Both hips are normally located. No stress fracture or AVN. Mild bilateral hip joint degenerative changes. Diffuse edema like signal changes involving the paraspinal and psoas muscles, likely nonspecific myositis. I do not see any muscle tears or muscle lesions. The hamstring tendons are intact. There is mild bilateral hip peritendinitis but  no findings for trochanteric bursitis. No significant intrapelvic abnormalities are identified. No mass or adenopathy. IMPRESSION: 1. Diffuse marrow abnormality likely due to renal osteodystrophy. No worrisome bone lesions or findings suspicious for osteomyelitis or septic arthritis. 2. Nonspecific edema like signal changes in the paraspinal and psoas muscles. 3. No significant intrapelvic abnormalities. Electronically Signed   By: Marijo Sanes M.D.   On: 10/25/2019 07:39   Ir Removal Tun Cv Cath W/o Fl  Result Date: 10/24/2019 INDICATION: Patient with history of ESRD on HD s/p tunneled right IJ HD catheter placement in OR 07/25/2019 by Dr. Constance Haw. Patient currently admitted to [facility] and found to have recurrence Pseudomonas bacteremia. Request is made for removal of tunneled hemodialysis catheter for line holiday. EXAM: REMOVAL OF TUNNELED HEMODIALYSIS CATHETER MEDICATIONS: None COMPLICATIONS: None immediate. PROCEDURE: Informed written consent was obtained from the patient following an explanation of the  procedure, risks, benefits and alternatives to treatment. A time out was performed prior to the initiation of the procedure. Maximal barrier sterile technique was utilized including mask, sterile gloves, large sterile drape, hand hygiene, and Hibiclens. Utilizing gentle traction, the catheter was removed intact. Hemostasis was obtained with manual compression. A dressing was placed. The patient tolerated the procedure well without immediate post procedural complication. IMPRESSION: Successful removal of tunneled dialysis catheter. Read by: Earley Abide, PA-C Electronically Signed   By: Lucrezia Europe M.D.   On: 10/23/2019 16:01    Assessment and Plan:   Stroke - Patient presented 10/19/29 for left sided weakness.  - MRI noted right basal ganglia infarcts for which Aspirin and plavix were continued.  - DVT was found in right IJ cathter and she was started in IV heparin. ASA and plavix discontinued - 2D echo showed preserved EF. No PFO on bubble study. Carotid duplex unremarkable. -  patient found to have bacteremia - TEE showed large MV vegetation - Neurology following  Recurrent Pseudomonas bacteremia - Likely 2/2 to HD catheter - On IV abx - Patient has a h/o of treatment for pseudomonas bactermia for 6 weeks. At that time HD catheter was removed and a new right IJ tunneled was placed.  - Given new bacteremia the right IJ catheter was removed 11/30 - With persistent fevers TEE was obtained.Endocarditis found on TEE - Repeat Blood cultures negative - Planned for new HD cath placement later today - ID following  MV vegetation/Moderate MR/Endocarditis - Seen on TEE today moderate MR - Not seen on 2D echo 10/20/19  - Murmur on exam - Patient denies shortness of breath or chest pain. Appears euvolemic on exam. Volume management per HD.  - Consider cardiac surgery consult. - Continue abx per ID recommendation  Right IJ DVT - likely secondary to HD catheter - Catheter was removed - plan  for treatment with anticoagulation for 3 months - on IV heparin with plan to transition to coumadin - per IM  ESRD  - MWF schedule - nephrology following - Plan for new tunneled HD cath later today  HTN - coreg - amlodipine on hold - permissive hypertension   For questions or updates, please contact [place] Please consult [email or web address] for contact info under     Signed, Cadence Ninfa Meeker, PA-C  10/26/2019 12:35 PM   ---------------------------------------------------------------------------------------------   History and all data above reviewed.  Patient examined.  I agree with the findings as above.  Shelva Ream is a very pleasant 68 year old female currently admitted for stroke and found to have fevers, on whom we were consulted for  TEE finding of mitral valve vegetation in the setting of stroke.  She has a history of recurrent pseudomonal infections related to dialysis catheter.in June she had a right arm graft infection with documented Pseudomonas and received Bactrim for several weeks.  In August 2020 she was found to have pseudomonal bacteremia she had her left IJ catheter removed and received 6 weeks of antibiotics.  She presented on 10/20/2019 with left-sided weakness and difficulty ambulating and was found to have a right basal ganglia infarct on MRI.  During her hospitalization she was found to have fevers, TEE was performed and demonstrated a masslike lesion on the mitral valve in the setting of pseudomonal bacteremia.  This is felt to be consistent with mitral valve endocarditis.  She is being followed by infectious diseases who recommends 6 weeks of antibiotics and recommends treatment with oral Cipro 500 mg daily as well as cefepime dosed at dialysis.  She has no other known cardiac disease.  Constitutional: No acute distress Eyes: sclera non-icteric, normal conjunctiva and lids ENMT: moist mucous membranes Cardiovascular: regular rhythm, normal rate, 2  out of 6 holosystolic murmur heard best in the left axilla.  S1 and S2 normal. Radial pulses normal bilaterally. No jugular venous distention.  Respiratory: clear to auscultation bilaterally GI : normal bowel sounds, soft and nontender. No distention.   MSK: extremities warm, well perfused. No edema.  NEURO: grossly nonfocal exam, moves all extremities. PSYCH: alert and oriented x 3, normal mood and affect.   All available labs, radiology testing, previous records reviewed. Agree with documented assessment and plan of my colleague as stated above with the following additions or changes:  Active Problems:   ESRD needing dialysis ([place])   Anemia of chronic renal failure   Type 2 diabetes mellitus with diabetic nephropathy (HCC)   Bacteremia due to Pseudomonas   Acute ischemic stroke ([place])   Acute left-sided weakness    Plan: I have independently reviewed the images from her transesophageal echocardiogram with the performing cardiologist.  It appears that she has a vegetation on the anterior mitral leaflet adjacent to the annulus.  Several images suggest a windsock deformity of the anterior mitral leaflet, this may be consistent with an acute on chronic endocarditic lesion, and there may have been endocarditis during one of her previous episodes of pseudomonal bacteremia.  I do not clearly see abscess formation along the aorto mitral curtain though there is some slight thickening in that region, nor does there appear to be definite pockets of gas in this region with known pseudomonal bacteremia.  However her telemetry suggests first-degree AV block.  I will repeat an ECG for comparison to the prior ECGs from this hospitalization to ensure we do not have developing AV block as a result of endocarditis. Mitral regurgitation appears to be eccentric and moderate. Pulmonary vein Dopplers not available for review and suboptimal on TTE.  I would recommend consultation with cardiac surgery.  In light of her  stroke, it is unlikely that she would be a candidate for surgical intervention during this hospitalization, however I would appreciate guidance from our surgical colleagues as to definitive management of the endocarditis that can perhaps be performed in time.  Length of Stay:  LOS: 7 days   Elouise Munroe, MD [facility]

## 2019-10-26 NOTE — Progress Notes (Signed)
STROKE TEAM PROGRESS NOTE   INTERVAL HISTORY Son at bedside.  Patient lying in bed, just had a TEE which showed large MV vegetation, consistent with endocarditis in the setting of bacteremia related to HD catheter.  ID on board, continue cefepime.  Nephrology will put tunneled catheter for continued HD.  Transition from heparin IV to Coumadin as per primary team.  OBJECTIVE Vitals:   10/26/19 0905 10/26/19 0911 10/26/19 0921 10/26/19 1056  BP: (!) 160/78 (!) 153/76 (!) 153/73 137/81  Pulse: 87 85 84 83  Resp: 19 18 17 15   Temp: 97.8 F (36.6 C)   98.4 F (36.9 C)  TempSrc: Temporal   Oral  SpO2: 96% 96% 95% 97%  Weight:      Height:        CBC:  Recent Labs  Lab 10/20/19 0659  10/25/19 0314 10/26/19 0353  WBC 15.7*   < > 10.2 9.9  NEUTROABS 12.4*  --   --   --   HGB 9.7*   < > 7.4* 8.2*  HCT 32.8*   < > 23.4* 24.5*  MCV 91.6   < > 90.7 88.4  PLT 348   < > 200 216   < > = values in this interval not displayed.    Basic Metabolic Panel:  Recent Labs  Lab 10/25/19 0314 10/26/19 0353  NA 137 137  K 4.8 4.9  CL 98 97*  CO2 21* 23  GLUCOSE 113* 111*  BUN 53* 68*  CREATININE 9.14* 10.57*  CALCIUM 8.4* 8.1*  PHOS 5.5* 6.4*    Lipid Panel:     Component Value Date/Time   CHOL 150 10/21/2019 0405   TRIG 192 (H) 10/21/2019 0405   HDL 47 10/21/2019 0405   CHOLHDL 3.2 10/21/2019 0405   VLDL 38 10/21/2019 0405   LDLCALC 65 10/21/2019 0405   HgbA1c:  Lab Results  Component Value Date   HGBA1C 7.6 (H) 10/21/2019   Urine Drug Screen:     Component Value Date/Time   LABOPIA NONE DETECTED 07/19/2019 1220   COCAINSCRNUR NONE DETECTED 07/19/2019 1220   LABBENZ NONE DETECTED 07/19/2019 1220   AMPHETMU NONE DETECTED 07/19/2019 1220   THCU NONE DETECTED 07/19/2019 1220   LABBARB NONE DETECTED 07/19/2019 1220    Alcohol Level     Component Value Date/Time   ETH <10 07/19/2019 1405    IMAGING  Dg Chest 1 View 10/20/2019 IMPRESSION:  Possible trace left  pleural effusion.   Ct Head Wo Contrast 10/20/2019 IMPRESSION:  1. Acute right basal ganglia infarcts.  2. Mild chronic small vessel ischemic disease.   Mr Brain 98 Contrast Mr Angio Head Wo Contrast 10/20/2019 IMPRESSION:  1. Acute right basal ganglia infarcts.  2. Chronic ischemia with multiple old infarcts as above.  3. Negative head MRA.   TTE 10/20/2019 Impression 1. Left ventricular ejection fraction, by visual estimation, is 60 to 65%. The left ventricle has normal function. There is mildly increased left ventricular hypertrophy. 2. Left ventricular diastolic parameters are consistent with Grade I diastolic dysfunction (impaired relaxation). 3. Global right ventricle has normal systolic function.The right ventricular size is normal. 4. Right atrial size was normal. 5. Trivial pericardial effusion is present. 6. Mild mitral annular calcification. 7. The mitral valve is normal in structure. Trace mitral valve regurgitation. No evidence of mitral stenosis. 8. The tricuspid valve is normal in structure. Tricuspid valve regurgitation is mild. 9. The aortic valve is tricuspid. Aortic valve regurgitation is not visualized. Mild to moderate  aortic valve sclerosis/calcification without any evidence of aortic stenosis. 10. The pulmonic valve was normal in structure. Pulmonic valve regurgitation is not visualized. 11. The inferior vena cava is normal in size with greater than 50% respiratory variability, suggesting right atrial pressure of 3 mmHg. 12. Normal LV systolic function; grade 1 diastolic dysfunction; mild LVH; mild LAE.  Bilateral Carotid Dopplers  Right Carotid: Velocities in the right ICA are consistent with a 1-39% stenosis. Left Carotid: Velocities in the left ICA are consistent with a 1-39% stenosis. Vertebrals:  Bilateral vertebral arteries demonstrate antegrade flow. Subclavians: Normal flow hemodynamics were seen in bilateral subclavian arteries.  Bilateral Lower  Extremity Venous Dopplers Right: There is no evidence of deep vein thrombosis in the lower extremity. Left: There is no evidence of deep vein thrombosis in the lower extremity. However, portions of this examination were limited- see technologist comments above.  Bilateral upper Extremity Venous Dopplers Right: No evidence of superficial vein thrombosis in the upper extremity. Findings consistent with acute deep vein thrombosis involving the right internal jugular vein. Findings consistent with age indeterminate deep vein thrombosis involving the one of the paired veins of the right brachial veins. Left:  No evidence of thrombosis in the subclavian.  ECG - SR rate 83 BPM. (See cardiology reading for complete details)  Transcranial Dopplers 10/21/19 A vascular evaluation was performed. The right ICA siphon was studied. An IV was inserted into the patient's right forearm. Verbal informed consent was obtained. No obvious evidence of high intensity transient signals (HITS), therefore no evidence of clinically significant patent foramen ovale (PFO).  TEE Normal LV function; trace AI; large vegetation on anterior MV leaflet; moderate, eccentric, posterolaterally directed MR; mild TR.  PHYSICAL EXAM  Temp:  [97.3 F (36.3 C)-98.9 F (37.2 C)] 98.4 F (36.9 C) (12/03 1056) Pulse Rate:  [82-91] 83 (12/03 1056) Resp:  [11-20] 15 (12/03 1056) BP: (108-200)/(68-91) 137/81 (12/03 1056) SpO2:  [95 %-100 %] 97 % (12/03 1056)  General - Well nourished, well developed, in no apparent distress, lethargic.  Ophthalmologic - fundi not visualized due to noncooperation.  Cardiovascular - Regular rhythm and rate.  Neuro - awake alert but lethargic, eyes open, mild abulia. Orientated to time place and people. No aphasia, following all simple commands, able to name and repeat. PERRL, visual field full, EOMI, no gaze preference. Mild left facial asymmetry, tongue midline. LUE 4/5 with pronator drift, finger  grip 4/5. LLE proximal 3/5, knee flexion 4/5, foot DF and PF 4/5. Sensation symmetrical bilaterally, FTN intact bilaterally although slow on the left. DTR 1+ on the left and no babinski. Gait not tested.    ASSESSMENT/PLAN [name] is a 68 y.o. female with history of ESRD ( on dialysis M-W-F), TIA 2010, HTN, HLD, Ha, DM2 who presented to [facility] with c/o dizziness, left arm weakness and left leg numbness. She did not receive IV t-PA due to late presentation (>4.5 hours from time of onset)  Stroke:  acute right BG, CR, caudate scattered infarcts, embolic pattern secondary to MV vegetation/endocarditis  CT head - Acute right basal ganglia infarcts.   MRI head -  Acute right BG, CR, caudate scattered infarcts. Chronic ischemia with multiple old infarcts as above.   MRA head - Negative   Carotid Doppler unremarkable  2D Echo - EF 60 - 65%  Bilateral LE Venous Dopplers no DVT  Bilateral UE venous doppler - right IJ acute DVT, right brachial vein age-indetermined DVT  TCD bubble study - no  PFO but lack of effort on valsalva.  TEE large vegetation of anterior MV leaflet  LDL - 65  HgbA1c - 7.6  VTE prophylaxis - [facility] Heparin  aspirin 81 mg daily and clopidogrel 75 mg daily prior to admission, now on IV heparin.  Schedule to transition to Coumadin as per primary team  Therapy recommendations:  SNF   Disposition:  Pending  Right IJ acute DVT  Found on UE venous Doppler  Could be related to dialysis catheter  On heparin IV - scheduled to transition to Coumadin as per primary team  R IJ removed for line holiday  ESRD on HD  04/2019 right arm AV graft removed  Cre 7.76->8.86->10.27->12.23->7.33->9.14  MWF schedule  Nephrology on board  HD today after tunnel catheter placement  Recurrent Pseudomonas Bacteremia MV endocarditis  06/2019 -admitted for altered mental status, blood culture showed positive gram negative rods, confirmed for  Pseudomonas infection, concerning for coming from left 3-4 discitis/osteomyelitis.  Treated with cefepime initially and then transition to [facility] for 6 weeks.  08/2019 blood culture repeat negative.  10/22/19 1/4 blood culture - pseudomonas 4/4 BCx- on cefepime  TEE 12/3 show large MV vegetation consistent with endocarditis.  R IJ out  Repeat BC neg  For new HD cath placement today followed by HD  ID on board, continue cefepime for 6 to 8 weeks  MV Endocarditis  TEE 12/3 show MV endocarditis  Not seen on echo  On abx per ID  Hypertension  Home BP meds: Coreg ; Norvasc ; Imdur  On  Imdur, coreg  BP stable  Long-term BP goal normotensive  Diabetes type II  Home diabetic meds: insulin  Current diabetic meds: insulin  HgbA1c 7.6, goal < 7.0  SSI  CBG monitoring  Close PCP follow-up  Fever   Tmax 101.9->100.7->afebrile->101->afebril  WBC 15.7->10.9->13.3 ->11.1->10.6->10.2->9.9  blood cultures 4/4 pseudomonas - on cefepime  UA WBC 6-10  Other Stroke Risk Factors  Advanced age  Hx TIA in 2010  Other Active Problems  Anemia due to ESRD - Hb - 9.7->8.6->9.6->8.1->8.4->7.4->8.2  Hospital day # 6  Neurology will sign off. Please call with questions. Pt will follow up with stroke clinic NP at [facility] in about 4 weeks. Thanks for the consult.   Rosalin Hawking, MD PhD Stroke Neurology 10/26/2019 2:20 PM   To contact Stroke Continuity provider, please refer to [email or web address] After hours, contact General Neurology

## 2019-10-26 NOTE — Progress Notes (Signed)
ANTICOAGULATION CONSULT NOTE - Follow-Up  Pharmacy Consult for Heparin Indication: RIJ DVT  No Known Allergies  Patient Measurements: Height: 5\' 7"  (170.2 cm) Weight: 144 lb 2.9 oz (65.4 kg) IBW/kg (Calculated) : 61.6 Heparin Dosing Weight: 63 kg  Vital Signs: BP: 108/68 (12/02 2000) Pulse Rate: 89 (12/02 2000)  Labs: Recent Labs    10/24/19 0419  10/25/19 0314 10/25/19 1048 10/25/19 1340 10/25/19 1540 10/26/19 0353  HGB 8.4*  --  7.4*  --   --   --  8.2*  HCT 26.5*  --  23.4*  --   --   --  24.5*  PLT 215  --  200  --   --   --  216  HEPARINUNFRC 0.20*   < > 0.43 0.42  --   --  0.35  CREATININE 7.33*  --  9.14*  --   --   --  10.57*  TROPONINIHS  --   --   --   --  84* 73*  --    < > = values in this interval not displayed.    Estimated Creatinine Clearance: 4.9 mL/min (A) (by C-G formula based on SCr of 10.57 mg/dL (H)).  Assessment: 35 YOF with ESRD who presented on 11/27 with new CVA and is now found to have RIJ DVT and pharmacy consulted to start Heparin anticoagulation - low goal and no bolus with concurrent CVA.   Heparin level remains therapeutic on 1150 units/hr, H/H stable, plts wnl.  Goal of Therapy:  Heparin level 0.3-0.5 units/ml Monitor platelets by anticoagulation protocol: Yes   Plan:  Continue heparin gtt at 1150 units/hr Daily heparin level, CBC, s/s bleeding  Bertis Ruddy, PharmD Clinical Pharmacist Please check AMION for all [place] numbers 10/26/2019 8:01 AM

## 2019-10-26 NOTE — Progress Notes (Signed)
PROGRESS NOTE  Auren Napp [id number] DOB: [birth year] DOA: 10/20/2019 PCP: Manon Hilding, MD  Brief History   Tanashia McCorkleis a 68 y.o.femalewith medical history ofESRD (MWF), diabetes mellitus type 2, stroke, hypertension, hyperlipidemia presented with left-sided weakness,  when she woke up to get ready for dialysis at 4 AM on 10/20/2019, the patient noted left-sided weakness and had difficulty ambulating. Shw was found to have right basal ganglia infarcts on MRI. Her hospitalization complicated by fevers of 101, and finding of  right IJ DVT on Dopplers. She will require 3 months of anticoagulation for this. She is now found to have recurrent Pseudomonas bacteremia. Right IJ  HD catheter was removed on 10/23/2019. TEE performed on 10/26/2019 demonstrates large vegetation of mitral valve. CTS consult. Recent history of stroke raises a possible barrier to near-term surgical intervention.  Permissive hypertension is in effect.  Pt had new tunneled catheter placed today for the continuation of HD.   Consultants   Infectious disease  Nephrology  Neurology  Cardiology  Procedures   Dialysis  Antibiotics   Anti-infectives (From admission, onward)   Start     Dose/Rate Route Frequency Ordered Stop   10/26/19 1800  ciprofloxacin (CIPRO) tablet 500 mg     500 mg Oral Daily-1800 10/26/19 0924     10/26/19 1541  ceFAZolin (ANCEF) 2-4 GM/100ML-% IVPB    Note to Pharmacy: Manuela Neptune   : cabinet override      10/26/19 1541 10/26/19 1642   10/24/19 2000  ceFEPIme (MAXIPIME) 1 g in sodium chloride 0.9 % 100 mL IVPB     1 g 200 mL/hr over 30 Minutes Intravenous Every 24 hours 10/23/19 1100     10/23/19 1200  ceFEPIme (MAXIPIME) 2 g in sodium chloride 0.9 % 100 mL IVPB     2 g 200 mL/hr over 30 Minutes Intravenous Every Mon (Hemodialysis) 10/23/19 1044 10/23/19 1217   10/23/19 0745  ceFEPIme (MAXIPIME) 2 g in sodium chloride 0.9 % 100 mL IVPB  Status:  Discontinued     2  g 200 mL/hr over 30 Minutes Intravenous  Once 10/23/19 0736 10/23/19 1115     Subjective  The patient is resting comfortably. No new complaints.  Objective   Vitals:  Vitals:   10/26/19 1630 10/26/19 1635  BP: (!) 161/89 (!) 148/85  Pulse: 88 84  Resp: 17 19  Temp:    SpO2: 100% 100%   Exam:  Constitutional:   The patient is awake, alert, and oriented x 3. No acute distress. Eyes:   pupils and irises appear normal  Normal lids and conjunctivae ENMT:   grossly normal hearing   Lips appear normal  external ears, nose appear normal  Oropharynx: mucosa, tongue,posterior pharynx appear normal Neck:   neck appears normal, no masses, normal ROM, supple  no thyromegaly Respiratory:   No increased work of breathing.  No wheezes, rales, or rhonchi  No tactile fremitus Cardiovascular:   Regular rate and rhythm  No murmurs, ectopy, or gallups.  No lateral PMI. No thrills. Abdomen:   Abdomen is soft, non-tender, non-distended  No hernias, masses, or organomegaly  Normoactive bowel sounds.  Musculoskeletal:   No cyanosis, clubbing, or edema Skin:   No rashes, lesions, ulcers  palpation of skin: no induration or nodules Neurologic:   CN 2-12 intact  Sensation all 4 extremities intact Psychiatric:   Mental status o Mood, affect appropriate o Orientation to person, place, time   judgment and insight appear intact   I  have personally reviewed the following:   Today's Data   Vitals, BMP, CBC, troponins   Micro Data   Blood cultures: 2/2 positive for pseudomonas aeruginosa  Surveillance cultures 2/2 (10/24/2019) Have had no growth.  Imaging   MRI left hip:  o No findings suspicious for osteomyelitis or septic arthritis o Edema of the paraspinal and psoas muscles o osteodystrophy  Scheduled Meds:  acetaminophen  1,000 mg Oral Once   calcium carbonate  1 tablet Oral 2 times per day   carvedilol  6.25 mg Oral BID   chlorhexidine        Chlorhexidine Gluconate Cloth  6 each Topical Q0600   ciprofloxacin  500 mg Oral q1800   doxercalciferol  1.5 mcg Intravenous Q M,W,F-HD   feeding supplement (ENSURE ENLIVE)  237 mL Oral BID BM   gabapentin  100 mg Oral QHS   heparin       insulin aspart  0-5 Units Subcutaneous QHS   insulin aspart  0-9 Units Subcutaneous TID WC   isosorbide mononitrate  30 mg Oral QPC lunch   multivitamin  1 tablet Oral QPC lunch   Continuous Infusions:  sodium chloride Stopped (10/20/19 1317)   ceFEPime (MAXIPIME) IV 1 g (10/25/19 1953)   heparin 1,150 Units/hr (10/25/19 2323)    Active Problems:   ESRD needing dialysis ([place])   Anemia of chronic renal failure   Type 2 diabetes mellitus with diabetic nephropathy ([place])   Bacteremia due to Pseudomonas   Acute ischemic stroke ([place])   Acute left-sided weakness   LOS: 6 days   A & P  Acute ischemic stroke: quite possibly due to septic emboli from large mitral valve vegetation.  MRI notes right basal ganglia infarcts, continues to have significant left-sided deficits. TEE performed today demonstrates a large vegetation on the mitral valve with possible destruction of the valve. Carotid duplex is unremarkable, no PFO noted on TCD bubble study. Patient was on aspirin and Plavix at baseline which was continued however given concomitant new finding of acute DVT in her right IJ she was started on IV heparin without bolus, after discussion with neurology Dr. Erlinda Hong. Aspirin and Plavix discontinued 11/29. Hemoglobin A1c is 7.6, LDL is 65. I appreciate the assistance of neurology. CIR has been recommended and they have been consulted.   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 It was replaced again this afternoon. Continue IV Cefepime. MRI of left hip demonstrated osteodystrophy, but no signs of osteomyelitis. There was some edema associated with the paraspinal myscles and the psoas on the left. Back in the end of August when she had the first episode of  Pseudomonas bacteremia and there was concern for possible discitis of L3-L4, this was later felt to be renal spondyloarthropathy on repeat imaging. Monitor. I appreciate infectious disease's assistance. Surveillance cultures have had no growth.   Right IJ acute DVT: This is secondary to her HD catheter, discussed with renal, given concurrent bacteremia will need HD catheter removal. Treat with anticoagulation for 3 months after catheter removal. As ESRD options are limited, we will continue IV heparin without bolus and aim for a lower PTT, and  hold off on Coumadin  until new dialysis access issues sorted out. Will restart coumadin in the morning.  ESRD: Nephrology consulted, usually dialyzes Monday Wednesday Friday. Right IJ HD catheter removed 11/30, last HD was yesterday 11/30. HD catheter placed again this afternoon.   Diabetes mellitus type 2: Hemoglobin A1c is 7.6. She is on a NovoLog sliding scale.  Essential hypertension: Restart Coreg, amlodipine on hold. Hydralazine prn SBP >220  Anemia of chronic disease: Per renal.  I have seen and examined this patient myself.   DVT prophylaxis: Heparin IV Code Status: DNR Family Communication: no family at bedside Disposition Plan: Possibly CIR when above work-up completed  Lowry Bala, DO [facility] Direct contact: see [email or web address]  7PM-7AM contact night coverage as above 10/26/2019, 5:40 PM  LOS: 5 days

## 2019-10-26 NOTE — Progress Notes (Signed)
Physical Therapy Treatment Patient Details Name: [name] MRN: [id number] DOB: 1951/07/22 Today's Date: 10/26/2019    History of Present Illness [name] is a 68 y.o. female with history of ESRD ( on dialysis M-W-F), TIA 2010, HTN, HLD, Ha, DM2 who presented to  [facility] with c/o dizziness, left arm weakness and left leg numbness. MRI revealed acute R basal ganglia infarct. In addition, she has R IJ DVT and spiked a fever 11/27 with concern for sepsis.     PT Comments    Patient received in bed, son present. Patient reports she is feeling okay while still in bed. Agrees to PT. Requires mod/max assist with supine to sit. Increased pain level to reported 10/10 in left hip with sitting. Patient jerking while seated on side of bed. Requires external support to maintain sitting and for safety. Supine bed exercises performed with min assist prior to mobility. Patient will benefit from continued skilled PT while here to improve strength and functional independence.        Follow Up Recommendations  SNF;Supervision/Assistance - 24 hour     Equipment Recommendations  None recommended by PT    Recommendations for Other Services       Precautions / Restrictions Precautions Precautions: Fall Required Braces or Orthoses: Spinal Brace Spinal Brace: Thoracolumbosacral orthotic;Other (comment)(not applied today as she was only able to sit on side of bed) Restrictions Weight Bearing Restrictions: No    Mobility  Bed Mobility Overal bed mobility: Needs Assistance Bed Mobility: Supine to Sit;Sit to Supine     Supine to sit: Mod assist;HOB elevated Sit to supine: Mod assist      Transfers                 General transfer comment: Not attempted this day as patient is tto painful once seated on side of bed.  Ambulation/Gait             General Gait Details: unable today   Stairs             Wheelchair Mobility    Modified Rankin  (Stroke Patients Only) Modified Rankin (Stroke Patients Only) Pre-Morbid Rankin Score: No symptoms Modified Rankin: Severe disability     Balance Overall balance assessment: Needs assistance Sitting-balance support: Feet supported;Bilateral upper extremity supported Sitting balance-Leahy Scale: Poor Sitting balance - Comments: requires mod assist to maintain sitting balance, generally floppy                                    Cognition Arousal/Alertness: Awake/alert Behavior During Therapy: WFL for tasks assessed/performed Overall Cognitive Status: Within Functional Limits for tasks assessed                                 General Comments: lethargic      Exercises Other Exercises Other Exercises: supine B LE exercises: AP, heel slides, hip abd x 10 ( slightly painful with L Le exercise)    General Comments        Pertinent Vitals/Pain Pain Assessment: 0-10 Pain Score: 10-Worst pain ever Pain Location: left hip Pain Descriptors / Indicators: Grimacing;Moaning;Aching Pain Intervention(s): Monitored during session;Limited activity within patient's tolerance;Repositioned    Home Living                      Prior Function  PT Goals (current goals can now be found in the care plan section) Acute Rehab PT Goals Patient Stated Goal: to get back to her normal PT Goal Formulation: With patient Time For Goal Achievement: 11/05/19 Potential to Achieve Goals: Fair Progress towards PT goals: Not progressing toward goals - comment(pain limited)    Frequency    Min 4X/week      PT Plan Current plan remains appropriate    Co-evaluation              AM-PAC PT "6 Clicks" Mobility   Outcome Measure  Help needed turning from your back to your side while in a flat bed without using bedrails?: A Lot Help needed moving from lying on your back to sitting on the side of a flat bed without using bedrails?: A Lot Help  needed moving to and from a bed to a chair (including a wheelchair)?: Total Help needed standing up from a chair using your arms (e.g., wheelchair or bedside chair)?: Total Help needed to walk in hospital room?: Total Help needed climbing 3-5 steps with a railing? : Total 6 Click Score: 8    End of Session   Activity Tolerance: Patient limited by pain Patient left: in bed;with bed alarm set Nurse Communication: Mobility status PT Visit Diagnosis: Muscle weakness (generalized) (M62.81);Other abnormalities of gait and mobility (R26.89);Pain Pain - Right/Left: Left Pain - part of body: Hip     Time: 1315-1346 PT Time Calculation (min) (ACUTE ONLY): 31 min  Charges:  $Therapeutic Exercise: 8-22 mins $Therapeutic Activity: 8-22 mins                     Alyze Lauf, PT, GCS 10/26/19,1:57 PM

## 2019-10-26 NOTE — Progress Notes (Signed)
[facility]   Reason for visit: Follow up on Pseudomonal mitral valve endocarditis  Antibiotics: Cefepime, day 4  Interval History: TEE earlier today showed a mitral valve vegetation. RT [place] permacath placed this afternoon for HD, planned for later this evening. Tagged WBC scan unable to be completed today due to other procedures (nursing confirms it remains scheduled for tomorrow, however). Pt reports lower appetite today, mostly due to fewer meals given multiple procedures. Fever curve, WBC & Cr trends, imaging, cx results, and ABX usage all independently reviewed    Current Facility-Administered Medications:  .  0.9 %  sodium chloride infusion, , Intravenous, Continuous, Tat, David, MD, Stopped at 10/20/19 1317 .  acetaminophen (TYLENOL) tablet 650 mg, 650 mg, Oral, Q4H PRN, 650 mg at 10/25/19 1959 **OR** acetaminophen (TYLENOL) 160 MG/5ML solution 650 mg, 650 mg, Per Tube, Q4H PRN **OR** acetaminophen (TYLENOL) suppository 650 mg, 650 mg, Rectal, Q4H PRN, Tat, David, MD .  acetaminophen (TYLENOL) tablet 1,000 mg, 1,000 mg, Oral, Once, Tat, David, MD .  calcium carbonate (TUMS - dosed in mg elemental calcium) chewable tablet 200 mg of elemental calcium, 1 tablet, Oral, 2 times per day, Tat, David, MD, 200 mg of elemental calcium at 10/25/19 1744 .  carvedilol (COREG) tablet 6.25 mg, 6.25 mg, Oral, BID, Rosalin Hawking, MD, 6.25 mg at 10/26/19 1123 .  ceFEPIme (MAXIPIME) 1 g in sodium chloride 0.9 % 100 mL IVPB, 1 g, Intravenous, Q24H, Rolla Flatten, [facility], Last Rate: 200 mL/hr at 10/25/19 1953, 1 g at 10/25/19 1953 .  chlorhexidine (HIBICLENS) 4 % liquid, , , ,  .  Chlorhexidine Gluconate Cloth 2 % PADS 6 each, 6 each, Topical, Q0600, Claudia Desanctis, MD, 6 each at 10/26/19 0535 .  ciprofloxacin (CIPRO) tablet 500 mg, 500 mg, Oral, q1800, Dixon, Melton Krebs, NP .  doxercalciferol (HECTOROL) injection 1.5 mcg, 1.5 mcg, Intravenous, Q M,W,F-HD, Claudia Desanctis, MD,  1.5 mcg at 10/23/19 1142 .  feeding supplement (ENSURE ENLIVE) (ENSURE ENLIVE) liquid 237 mL, 237 mL, Oral, BID BM, Swayze, Ava, DO .  fentaNYL (SUBLIMAZE) injection, , Intravenous, PRN, Sandi Mariscal, MD, 50 mcg at 10/26/19 1628 .  fluticasone (FLONASE) 50 MCG/ACT nasal spray 1 spray, 1 spray, Each Nare, Daily PRN, Tat, David, MD .  gabapentin (NEURONTIN) capsule 100 mg, 100 mg, Oral, QHS, Tat, David, MD, 100 mg at 10/25/19 2207 .  heparin 1000 UNIT/ML injection, , , ,  .  heparin ADULT infusion 100 units/mL (25000 units/256mL sodium chloride 0.45%), 1,150 Units/hr, Intravenous, Continuous, Domenic Polite, MD, Last Rate: 11.5 mL/hr at 10/25/19 2323, 1,150 Units/hr at 10/25/19 2323 .  hydrALAZINE (APRESOLINE) injection 10 mg, 10 mg, Intravenous, Q6H PRN, Tat, David, MD .  insulin aspart (novoLOG) injection 0-5 Units, 0-5 Units, Subcutaneous, Benay Pike, MD, 4 Units at 10/25/19 2207 .  insulin aspart (novoLOG) injection 0-9 Units, 0-9 Units, Subcutaneous, TID WC, Tat, Shanon Brow, MD, 1 Units at 10/26/19 1124 .  isosorbide mononitrate (IMDUR) 24 hr tablet 30 mg, 30 mg, Oral, QPC lunch, Tat, David, MD, 30 mg at 10/26/19 1248 .  lidocaine-EPINEPHrine (XYLOCAINE W/EPI) 1 %-1:100000 (with pres) injection, , , PRN, Sandi Mariscal, MD, 20 mL at 10/26/19 1631 .  midazolam (VERSED) injection, , Intravenous, PRN, Sandi Mariscal, MD, 1 mg at 10/26/19 1628 .  multivitamin (RENA-VIT) tablet 1 tablet, 1 tablet, Oral, QPC lunch, Tat, David, MD, 1 tablet at 10/25/19 2207 .  ondansetron (ZOFRAN) injection 4 mg, 4 mg, Intravenous,  Q6H PRN, Omar Person, NP, 4 mg at 10/24/19 [id number] .  oxyCODONE (Oxy IR/ROXICODONE) immediate release tablet 5 mg, 5 mg, Oral, Q6H PRN, Domenic Polite, MD, 5 mg at 10/25/19 0831 .  senna-docusate (Senokot-S) tablet 1 tablet, 1 tablet, Oral, QHS PRN, Tat, Shanon Brow, MD  Facility-Administered Medications Ordered in Other Encounters:  .  0.9 %  sodium chloride infusion, , Intravenous, Continuous,  Monia Sabal, [place]   Physical Exam:   Vitals:   10/26/19 1625 10/26/19 1630  BP: (!) 174/90 (!) 161/89  Pulse: 90 88  Resp: 10 17  Temp:    SpO2: 100% 100%   Physical Exam Lines: RT [place] permacath, PIV x 2  Gen: pleasant, chronically ill, mild distress secondary to low back pain, A&Ox 3 Head: NCAT, no temporal wasting evident EENT: PERRL, EOMI, MMM, adequate dentition Neck: supple, no JVD CV: NRRR, I/VI SEM at LLSB Pulm: CTA bilaterally, no wheeze or retractions Abd: soft, obese, NTND, +BS Extrems: 1+ non-pitting LE edema, 1+ pulses Skin: no rashes, adequate skin turgor Neuro: CN II-XII grossly intact, no focal neurologic deficits appreciated, gait was not assessed, A&Ox 3   Review of Systems:  Review of Systems  Constitutional: Positive for malaise/fatigue. Negative for chills, fever and weight loss.  HENT: Negative for congestion, hearing loss, sinus pain and sore throat.   Eyes: Negative for blurred vision, photophobia and discharge.  Respiratory: Negative for cough, hemoptysis and shortness of breath.   Cardiovascular: Negative for chest pain, palpitations, orthopnea and leg swelling.  Gastrointestinal: Negative for abdominal pain, constipation, diarrhea, heartburn, nausea and vomiting.  Genitourinary: Negative for dysuria, flank pain, frequency and urgency.  Musculoskeletal: Positive for back pain. Negative for joint pain and myalgias.  Skin: Negative for itching and rash.  Neurological: Positive for weakness. Negative for tremors, seizures and headaches.  Endo/Heme/Allergies: Negative for polydipsia. Does not bruise/bleed easily.  Psychiatric/Behavioral: Negative for depression and substance abuse. The patient is not nervous/anxious and does not have insomnia.      Lab Results  Component Value Date   WBC 9.9 10/26/2019   HGB 8.2 (L) 10/26/2019   HCT 24.5 (L) 10/26/2019   MCV 88.4 10/26/2019   PLT 216 10/26/2019    Lab Results  Component Value Date    CREATININE 10.57 (H) 10/26/2019   BUN 68 (H) 10/26/2019   NA 137 10/26/2019   K 4.9 10/26/2019   CL 97 (L) 10/26/2019   CO2 23 10/26/2019    Lab Results  Component Value Date   ALT 16 10/20/2019   AST 12 (L) 10/20/2019   ALKPHOS 75 10/20/2019     Microbiology: Recent Results (from the past 240 hour(s))  SARS CORONAVIRUS 2 (TAT 6-24 HRS) Nasopharyngeal Nasopharyngeal Swab     Status: None   Collection Time: 10/20/19  7:30 AM   Specimen: Nasopharyngeal Swab  Result Value Ref Range Status   SARS Coronavirus 2 NEGATIVE NEGATIVE Final    Comment: (NOTE) SARS-CoV-2 target nucleic acids are NOT DETECTED. The SARS-CoV-2 RNA is generally detectable in upper and lower respiratory specimens during the acute phase of infection. Negative results do not preclude SARS-CoV-2 infection, do not rule out co-infections with other pathogens, and should not be used as the sole basis for treatment or other patient management decisions. Negative results must be combined with clinical observations, patient history, and epidemiological information. The expected result is Negative. Fact Sheet for Patients: SugarRoll.be Fact Sheet for Healthcare Providers: https://www.woods-mathews.com/ This test is not yet approved or cleared by the  [place] [employer] and  has been authorized for detection and/or diagnosis of SARS-CoV-2 by FDA under an Print production planner (EUA). This EUA will remain  in effect (meaning this test can be used) for the duration of the COVID-19 declaration under Section 56 4(b)(1) of the Act, 21 U.S.C. section 360bbb-3(b)(1), unless the authorization is terminated or revoked sooner. Performed at [facility], [place] [address] [place], [place] [address]   Culture, blood (routine x 2)     Status: Abnormal   Collection Time: 10/22/19  7:35 AM   Specimen: BLOOD  Result Value Ref Range Status   Specimen Description BLOOD RIGHT  ANTECUBITAL  Final   Special Requests   Final    BOTTLES DRAWN AEROBIC ONLY Blood Culture adequate volume   Culture  Setup Time   Final    GRAM NEGATIVE RODS AEROBIC BOTTLE ONLY CRITICAL VALUE NOTED.  VALUE IS CONSISTENT WITH PREVIOUSLY REPORTED AND CALLED VALUE.    Culture (A)  Final    PSEUDOMONAS AERUGINOSA SUSCEPTIBILITIES PERFORMED ON PREVIOUS CULTURE WITHIN THE LAST 5 DAYS. Performed at [facility], [place] [address] [place], [place] [address]    Report Status 10/25/2019 FINAL  Final  Culture, blood (routine x 2)     Status: Abnormal   Collection Time: 10/22/19  7:43 AM   Specimen: BLOOD LEFT HAND  Result Value Ref Range Status   Specimen Description BLOOD LEFT HAND  Final   Special Requests   Final    BOTTLES DRAWN AEROBIC ONLY Blood Culture adequate volume   Culture  Setup Time   Final    GRAM NEGATIVE RODS AEROBIC BOTTLE ONLY CRITICAL RESULT CALLED TO, READ BACK BY AND VERIFIED WITH: PHARMD V BRYK [id number] AT 725 AM BY CM Performed at [facility], [place] [address], [place], [place] [address]    Culture PSEUDOMONAS AERUGINOSA (A)  Final   Report Status 10/25/2019 FINAL  Final   Organism ID, Bacteria PSEUDOMONAS AERUGINOSA  Final      Susceptibility   Pseudomonas aeruginosa - MIC*    CEFTAZIDIME 2 SENSITIVE Sensitive     CIPROFLOXACIN <=0.25 SENSITIVE Sensitive     GENTAMICIN <=1 SENSITIVE Sensitive     IMIPENEM 1 SENSITIVE Sensitive     PIP/TAZO <=4 SENSITIVE Sensitive     CEFEPIME <=1 SENSITIVE Sensitive     * PSEUDOMONAS AERUGINOSA  Blood Culture ID Panel (Reflexed)     Status: Abnormal   Collection Time: 10/22/19  7:43 AM  Result Value Ref Range Status   Enterococcus species NOT DETECTED NOT DETECTED Final   Listeria monocytogenes NOT DETECTED NOT DETECTED Final   Staphylococcus species NOT DETECTED NOT DETECTED Final   Staphylococcus aureus (BCID) NOT DETECTED NOT DETECTED Final   Streptococcus species NOT DETECTED NOT DETECTED Final   Streptococcus  agalactiae NOT DETECTED NOT DETECTED Final   Streptococcus pneumoniae NOT DETECTED NOT DETECTED Final   Streptococcus pyogenes NOT DETECTED NOT DETECTED Final   Acinetobacter baumannii NOT DETECTED NOT DETECTED Final   Enterobacteriaceae species NOT DETECTED NOT DETECTED Final   Enterobacter cloacae complex NOT DETECTED NOT DETECTED Final   Escherichia coli NOT DETECTED NOT DETECTED Final   Klebsiella oxytoca NOT DETECTED NOT DETECTED Final   Klebsiella pneumoniae NOT DETECTED NOT DETECTED Final   Proteus species NOT DETECTED NOT DETECTED Final   Serratia marcescens NOT DETECTED NOT DETECTED Final   Carbapenem resistance NOT DETECTED NOT DETECTED Final   Haemophilus influenzae NOT DETECTED NOT DETECTED Final  Neisseria meningitidis NOT DETECTED NOT DETECTED Final   Pseudomonas aeruginosa DETECTED (A) NOT DETECTED Final    Comment: CRITICAL RESULT CALLED TO, READ BACK BY AND VERIFIED WITH: PHARMD V BRYK [id number] AT 725 AM BY CM    Candida albicans NOT DETECTED NOT DETECTED Final   Candida glabrata NOT DETECTED NOT DETECTED Final   Candida krusei NOT DETECTED NOT DETECTED Final   Candida parapsilosis NOT DETECTED NOT DETECTED Final   Candida tropicalis NOT DETECTED NOT DETECTED Final    Comment: Performed at [facility], [place] [address], [place], [place] [address]  Culture, blood (routine x 2)     Status: Abnormal   Collection Time: 10/22/19  5:03 PM   Specimen: BLOOD LEFT HAND  Result Value Ref Range Status   Specimen Description BLOOD LEFT HAND  Final   Special Requests   Final    BOTTLES DRAWN AEROBIC AND ANAEROBIC Blood Culture adequate volume   Culture  Setup Time   Final    AEROBIC BOTTLE ONLY GRAM NEGATIVE RODS CRITICAL VALUE NOTED.  VALUE IS CONSISTENT WITH PREVIOUSLY REPORTED AND CALLED VALUE.    Culture (A)  Final    PSEUDOMONAS AERUGINOSA SUSCEPTIBILITIES PERFORMED ON PREVIOUS CULTURE WITHIN THE LAST 5 DAYS. Performed at [facility], [place] [address],  [place], [place] [address]    Report Status 10/25/2019 FINAL  Final  Culture, blood (routine x 2)     Status: Abnormal   Collection Time: 10/22/19  5:08 PM   Specimen: BLOOD LEFT ARM  Result Value Ref Range Status   Specimen Description BLOOD LEFT ARM  Final   Special Requests   Final    BOTTLES DRAWN AEROBIC ONLY Blood Culture results may not be optimal due to an inadequate volume of blood received in culture bottles   Culture  Setup Time   Final    AEROBIC BOTTLE ONLY GRAM NEGATIVE RODS CRITICAL VALUE NOTED.  VALUE IS CONSISTENT WITH PREVIOUSLY REPORTED AND CALLED VALUE.    Culture (A)  Final    PSEUDOMONAS AERUGINOSA SUSCEPTIBILITIES PERFORMED ON PREVIOUS CULTURE WITHIN THE LAST 5 DAYS. Performed at [facility], [place] [address], [place], [place] [address]    Report Status 10/25/2019 FINAL  Final  Culture, blood (routine x 2)     Status: None (Preliminary result)   Collection Time: 10/24/19  1:18 PM   Specimen: BLOOD LEFT HAND  Result Value Ref Range Status   Specimen Description BLOOD LEFT HAND  Final   Special Requests   Final    BOTTLES DRAWN AEROBIC ONLY Blood Culture adequate volume   Culture   Final    NO GROWTH 2 DAYS Performed at [facility], [place] [address], [place], [place] [address]    Report Status PENDING  Incomplete  Culture, blood (routine x 2)     Status: None (Preliminary result)   Collection Time: 10/24/19  1:28 PM   Specimen: BLOOD RIGHT HAND  Result Value Ref Range Status   Specimen Description BLOOD RIGHT HAND  Final   Special Requests   Final    BOTTLES DRAWN AEROBIC AND ANAEROBIC Blood Culture results may not be optimal due to an excessive volume of blood received in culture bottles   Culture   Final    NO GROWTH 2 DAYS Performed at [facility], [place] [address], [place], [place] [address]    Report Status PENDING  Incomplete    Impression/Plan: The patient is a 68 year old  African-American female diabetic with end-stage renal disease  on hemodialysis with a history of a Pseudomonal vascular graft infection of June 2020, status post debridement and ligation and treatment of complicated pseudomonal bacteremia now admitted with new onset left-sided weakness found to have basal ganglia CVA and 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  1.  Mitral valve endocarditis-the patient initially had an upper right arm graft infection in June of this year with documented Pseudomonas.  Unfortunately, she only received Bactrim following this procedure and several weeks later was readmitted in late August with a confirmed pseudomonal bacteremia.  Apparently, she received 6 weeks of IV Fortaz for treatment given at dialysis.  Several weeks after completion of this treatment, she was admitted again for another episode of pseudomonal bacteremia with basal ganglia CVA worrisome for embolic event.  Today's TEE confirmed a mitral valve vegetation and thus endocarditis.  While there is report the patient has had a subclavian DVT, I am uncertain which side or how recent this finding was.  I would proceed with a scheduled tagged white blood cell scan tomorrow in an effort to evaluate for other protected foci of infection that may benefit from surgical or percutaneous intervention to address any other hidden nidus of infection.  Given her confirmed endocarditis, she will require 6 weeks of antibiotic treatment.  Significant complexities for dosing of antibiotics for pseudomonal infection exist, so we will plan to treat the patient with daily oral Cipro 500 mg along with cefepime dosed at dialysis as noted below.  2. ESRD -the patient normally dialyzes every Monday, Wednesday, and Friday.  As her line had to be removed in order to clear her bacteremia, she will be dialyzed later tonight following the placement of a new right-sided permacath.  Will start the patient on oral Cipro 500 mg daily and while she remains in inpatient continue cefepime  dosed 1 g IV daily/nightly to ensure consistent dosing.  Provided no other source must be addressed/intervened upon, her cefepime will be likely transition to a dosing regimen of a 2 g IV q. Monday and Wednesday with a 3 g dose every Friday.   3. CVA -the patient's most recent MRI is suggestive of new acute right basal ganglia infarcts concerning for embolic events.  Given today's TEE findings, this most likely did occur as a result of her subacute endocarditis.  We will adjust antibiotics as noted above and defer efforts for rehab/physical therapy to the patient's primary team.  Of note, the patient's chronic back pain has been proven with recent imaging to not to be due to an active discitis.  1.

## 2019-10-26 NOTE — Progress Notes (Signed)
Referring Physician(s): Coladonato,J  Supervising Physician: Sandi Mariscal  Patient Status:  [facility] - In-pt  Chief Complaint:  Renal failure  Subjective: Pt s/p TEE this am; currently without new c/o- still has some back/left hip pain; son in room; request received for placement of new HD cath today; afebrile; WBC nl; latest blood cx neg to date  Past Medical History:  Diagnosis Date   Arthritis    hands   Constipation    Diabetes mellitus without complication ([place])    Type II   ESRD (end stage renal disease) ([place])     M/W/F- Hemodialysis   GERD (gastroesophageal reflux disease)    Headache    Hyperlipidemia    Hypertension    Irregular heart rate    Stroke ([place])    TIA - approx 2010- no residual   Past Surgical History:  Procedure Laterality Date   ABDOMINAL HYSTERECTOMY     AORTIC ARCH ANGIOGRAPHY N/A 03/28/2019   Procedure: AORTIC ARCH ANGIOGRAPHY;  Surgeon: Waynetta Sandy, MD;  Location: [place] [facility];  Service: Cardiovascular;  Laterality: N/A;   AV FISTULA PLACEMENT Left 06/13/2018   Procedure: ARTERIOVENOUS (AV) FISTULA CREATION;  Surgeon: Rosetta Posner, MD;  Location: [place];  Service: Vascular;  Laterality: Left;   AV FISTULA PLACEMENT Left 08/18/2018   Procedure: INSERTION OF 4-7MM X 45CM ARTERIOVENOUS (AV) GORE-TEX GRAFT LEFT  FOREARM;  Surgeon: Rosetta Posner, MD;  Location: [place];  Service: Vascular;  Laterality: Left;   AV FISTULA PLACEMENT Right 04/06/2019   Procedure: INSERTION OF ARTERIOVENOUS (AV) GORE-TEX GRAFT RIGHT UPPER ARM;  Surgeon: Waynetta Sandy, MD;  Location: [place];  Service: Vascular;  Laterality: Right;   AV FISTULA PLACEMENT Right 04/13/2019   Procedure: INSERTION OF ARTERIOVENOUS (AV) BOVINE  ARTEGRAFT GRAFT RIGHT UPPER EXTREMITY;  Surgeon: Serafina Mitchell, MD;  Location: [place];  Service: Vascular;  Laterality: Right;   [place] REMOVAL Right 05/16/2019   Procedure: REMOVAL OF ARTERIOVENOUS GORETEX GRAFT  ([place]) RIGHT ARM;  Surgeon: Angelia Mould, MD;  Location: [place];  Service: Vascular;  Laterality: Right;   [place] Right 03/07/2019   Procedure: First Stage Bascilic Vein Transposition Right Arm;  Surgeon: Serafina Mitchell, MD;  Location: [place];  Service: Vascular;  Laterality: Right;   CATARACT EXTRACTION Right 2005   INSERTION OF DIALYSIS CATHETER N/A 08/18/2018   Procedure: INSERTION OF DIALYSIS CATHETER;  Surgeon: Rosetta Posner, MD;  [facility];  Service: Vascular;  Laterality: N/A;   INSERTION OF DIALYSIS CATHETER Right 07/25/2019   Procedure: INSERTION OF DIALYSIS CATHETER RIGHT INTERNAL JUGULAR (TUNNELED);  Surgeon: Virl Cagey, MD;  Location: [facility];  Service: General;  Laterality: Right;   IR FLUORO GUIDE CV LINE RIGHT  12/06/2018   IR PTA ADDL CENTRAL DIALYSIS SEG THRU DIALY CIRCUIT RIGHT Right 12/06/2018   IR REMOVAL TUN CV CATH W/O FL  10/23/2019   THROMBECTOMY AND REVISION OF ARTERIOVENTOUS (AV) GORETEX  GRAFT Left 09/29/2018   Procedure: INSERTION OF ARTERIOVENTOUS (AV) GORETEX  GRAFT ARM;  Surgeon: Waynetta Sandy, MD;  Location: [place];  Service: Vascular;  Laterality: Left;   UPPER EXTREMITY ANGIOGRAPHY Right 03/28/2019   Procedure: UPPER EXTREMITY ANGIOGRAPHY;  Surgeon: Waynetta Sandy, MD;  Location: [place] [facility];  Service: Cardiovascular;  Laterality: Right;   VENOGRAM  10/27/2018   Procedure: Venogram;  Surgeon: Marty Heck, MD;  Location: [place] [facility];  Service: Cardiovascular;;  bilateral  arm    Allergies: Patient has no known allergies.  Medications: Prior to Admission medications   Medication Sig Start Date End Date Taking? Authorizing Provider  Accu-Chek Softclix Lancets lancets  08/17/19  Yes [provider]  Alcohol Swabs (B-D SINGLE USE SWABS REGULAR) PADS  08/07/19  Yes [provider]  amLODipine (NORVASC) 10 MG tablet Take 10 mg by mouth daily after lunch.    Yes  [provider]  aspirin EC 81 MG tablet Take 81 mg by mouth daily after lunch.    Yes [provider]  calcium carbonate (TUMS - DOSED IN MG ELEMENTAL CALCIUM) 500 MG chewable tablet Chew 1 tablet by mouth 2 (two) times daily. With lunch & supper   Yes [provider]  carvedilol (COREG) 6.25 MG tablet Take 6.25 mg by mouth 2 (two) times daily. 02/01/19  Yes [provider]  clopidogrel (PLAVIX) 75 MG tablet Take 1 tablet (75 mg total) by mouth daily. 04/13/19  Yes Serafina Mitchell, MD  fluticasone (FLONASE) 50 MCG/ACT nasal spray Place 1 spray into both nostrils daily as needed for allergies or rhinitis.    Yes [provider]  gabapentin (NEURONTIN) 100 MG capsule Take 1 capsule (100 mg total) by mouth at bedtime. 09/26/19  Yes Carlyle Basques, MD  insulin NPH-regular Human (NOVOLIN 70/30) (70-30) 100 UNIT/ML injection Inject 10-20 Units into the skin See admin instructions. Use twice daily per sliding scale: Blood sugar less than 150=12 units & Blood sugar 200 or more=20 units   Yes [provider]  methocarbamol (ROBAXIN) 500 MG tablet Take 1 tablet by mouth 3 (three) times daily as needed. 07/10/19  Yes [provider]  multivitamin (RENA-VIT) TABS tablet Take 1 tablet by mouth daily after lunch.  03/31/19  Yes [provider]  pantoprazole (PROTONIX) 40 MG tablet Take 1 tablet by mouth daily as needed.    Yes [provider]  Polyethylene Glycol 3350 (PEG 3350) 17 GM/SCOOP POWD Take 17 g by mouth daily as needed.  07/10/19  Yes [provider]  traMADol (ULTRAM) 50 MG tablet Take 50 mg by mouth 4 (four) times daily as needed. 10/09/19  Yes [provider]  isosorbide mononitrate (IMDUR) 30 MG 24 hr tablet Take 30 mg by mouth daily after lunch.     [provider]  predniSONE (DELTASONE) 20 MG tablet Take 20-40 mg by mouth as directed. Take 40mg  by mouth daily for 7 days, then takes 20mg  by mouth  daily for 7 days, then stop 10/09/19   [provider]     Vital Signs: BP (!) 153/73    Pulse 84    Temp 97.8 F (36.6 C) (Temporal)    Resp 17    Ht 5\' 7"  (1.702 m)    Wt 144 lb 2.9 oz (65.4 kg)    SpO2 95%    BMI 22.58 kg/m   Physical Exam awake/alert; chest- CTA bilat ant; heart- RRR; abd- soft,+BS, back brace in place; no LE edema  Imaging: Mr Hip Left Wo Contrast  Result Date: 10/25/2019 CLINICAL DATA:  Chronic left hip pain.  Pseudomonas bacteremia. EXAM: MR OF THE LEFT HIP WITHOUT CONTRAST TECHNIQUE: Multiplanar, multisequence MR imaging was performed. No intravenous contrast was administered. COMPARISON:  Prior MRI lumbar spines and CT scan abdomen/pelvis 09/10/2019 FINDINGS: Diffuse marrow abnormality as demonstrated on prior CT and MR examinations most likely due to renal osteodystrophy. This is most notable in the spine. I do not see any  findings suspicious for osteomyelitis. The pubic symphysis and SI joints are intact without findings suspicious for septic arthritis. Both hips are normally located. No stress fracture or AVN. Mild bilateral hip joint degenerative changes. Diffuse edema like signal changes involving the paraspinal and psoas muscles, likely nonspecific myositis. I do not see any muscle tears or muscle lesions. The hamstring tendons are intact. There is mild bilateral hip peritendinitis but no findings for trochanteric bursitis. No significant intrapelvic abnormalities are identified. No mass or adenopathy. IMPRESSION: 1. Diffuse marrow abnormality likely due to renal osteodystrophy. No worrisome bone lesions or findings suspicious for osteomyelitis or septic arthritis. 2. Nonspecific edema like signal changes in the paraspinal and psoas muscles. 3. No significant intrapelvic abnormalities. Electronically Signed   By: Marijo Sanes M.D.   On: 10/25/2019 07:39   Ir Removal Tun Cv Cath W/o Fl  Result Date: 10/24/2019 INDICATION: Patient with history of ESRD on HD  s/p tunneled right IJ HD catheter placement in OR 07/25/2019 by Dr. Constance Haw. Patient currently admitted to [facility] and found to have recurrence Pseudomonas bacteremia. Request is made for removal of tunneled hemodialysis catheter for line holiday. EXAM: REMOVAL OF TUNNELED HEMODIALYSIS CATHETER MEDICATIONS: None COMPLICATIONS: None immediate. PROCEDURE: Informed written consent was obtained from the patient following an explanation of the procedure, risks, benefits and alternatives to treatment. A time out was performed prior to the initiation of the procedure. Maximal barrier sterile technique was utilized including mask, sterile gloves, large sterile drape, hand hygiene, and Hibiclens. Utilizing gentle traction, the catheter was removed intact. Hemostasis was obtained with manual compression. A dressing was placed. The patient tolerated the procedure well without immediate post procedural complication. IMPRESSION: Successful removal of tunneled dialysis catheter. Read by: Earley Abide, PA-C Electronically Signed   By: Lucrezia Europe M.D.   On: 10/23/2019 16:01   Vas [name] Transcranial Doppler W Bubbles  Result Date: 10/24/2019  Transcranial Doppler with Bubble Indications: Stroke. Comparison Study: No prior study. Performing Technologist: Maudry Mayhew MHA, RDMS, RVT, RDCS  Examination Guidelines: A complete evaluation includes B-mode imaging, spectral Doppler, color Doppler, and power Doppler as needed of all accessible portions of each vessel. Bilateral testing is considered an integral part of a complete examination. Limited examinations for reoccurring indications may be performed as noted.  Summary:  A vascular evaluation was performed. The right ICA siphon was studied. An IV was inserted into the patient's right forearm. Verbal informed consent was obtained.  No obvious evidence of high intensity transient signals (HITS), therefore no evidence of clinically significant patent foramen ovale (PFO). Negative  TCD Bubble study *See table(s) above for measurements and observations.  Diagnosing physician: Antony Contras MD Electronically signed by Antony Contras MD on 10/24/2019 at 8:15:53 AM.    Final     Labs:  CBC: Recent Labs    10/23/19 0640 10/24/19 0419 10/25/19 0314 10/26/19 0353  WBC 11.1* 10.6* 10.2 9.9  HGB 8.1* 8.4* 7.4* 8.2*  HCT 25.5* 26.5* 23.4* 24.5*  PLT 210 215 200 216    COAGS: Recent Labs    05/16/19 1522 07/19/19 1336 07/20/19 1445 10/20/19 0659  INR 1.3* 1.1 1.0 1.0    BMP: Recent Labs    10/23/19 0640 10/24/19 0419 10/25/19 0314 10/26/19 0353  NA 136 136 137 137  K 4.8 4.6 4.8 4.9  CL 98 98 98 97*  CO2 22 24 21* 23  GLUCOSE 174* 104* 113* 111*  BUN 87* 40* 53* 68*  CALCIUM 8.1* 8.2* 8.4* 8.1*  CREATININE  12.23* 7.33* 9.14* 10.57*  GFRNONAA 3* 5* 4* 3*  GFRAA 3* 6* 5* 4*    LIVER FUNCTION TESTS: Recent Labs    07/21/19 0819 09/10/19 0015 09/10/19 0705  10/20/19 0659  10/23/19 0640 10/24/19 0419 10/25/19 0314 10/26/19 0353  BILITOT 0.6 0.2* 0.3  --  0.7  --   --   --   --   --   AST 25 26 17   --  12*  --   --   --   --   --   ALT 16 27 21   --  16  --   --   --   --   --   ALKPHOS 116 106 92  --  75  --   --   --   --   --   PROT 8.0 8.7* 7.5  --  7.9  --   --   --   --   --   ALBUMIN 3.1* 3.1* 2.9*   < > 2.8*   < > 1.9* 1.8* 1.9* 1.8*   < > = values in this interval not displayed.    Assessment and Plan: Pt with hx ESRD, DM, prior CVA, HTN, HLD, adm 11/27 with left sided weakness and rt basal ganglia infarcts; also with fevers and rt IJ DVT on doppler-on IV heparin; also recently treated for pseudomaonas bacteremia; HD cath removed 11/30; now afebrile with nl WBC and currently neg to date repeat blood cx; request received from nephrology for placement of new tunneled HD cath; details/risks of procedure, incl but not limited to, internal bleeding, infection, injury to adjacent structures d/w pt/son with their understanding and consent.  Procedure tent scheduled for today.    Electronically Signed: D. Rowe Robert, PA-C 10/26/2019, 10:50 AM   I spent a total of 20 minutes at the the patient's bedside AND on the patient's hospital floor or unit, greater than 50% of which was counseling/coordinating care for tunneled hemodialysis catheter placement    Patient ID: [name], female   DOB: Nov 29, 1950, 68 y.o.   MRN: [id number]

## 2019-10-26 NOTE — Progress Notes (Signed)
Patient ID: [name], female   DOB: 07/22/1951, 68 y.o.   MRN: [id number] S: Results of TEE revealing MV vegetation noted O:BP (!) 174/90   Pulse 90   Temp 98.4 F (36.9 C) (Oral)   Resp 10   Ht 5\' 7"  (1.702 m)   Wt 65.4 kg   SpO2 100%   BMI 22.58 kg/m   Intake/Output Summary (Last 24 hours) at 10/26/2019 1629 Last data filed at 10/26/2019 1500 Gross per 24 hour  Intake 621.67 ml  Output -  Net 621.67 ml   Intake/Output: I/O last 3 completed shifts: In: 1417 [P.O.:960; I.V.:257; IV Piggyback:200] Out: -   Intake/Output this shift:  Total I/O In: 28.2 [I.V.:28.2] Out: -  Weight change:  Gen: NAD CVS: no rub Resp: cta Abd: +BS, soft, NT/ND Ext: no edema  Recent Labs  Lab 10/20/19 0659 10/21/19 0405 10/22/19 0735 10/23/19 0640 10/24/19 0419 10/25/19 0314 10/26/19 0353  NA 139 140 138 136 136 137 137  K 4.4 4.9 4.8 4.8 4.6 4.8 4.9  CL 97* 100 98 98 98 98 97*  CO2 23 24 22 22 24  21* 23  GLUCOSE 139* 131* 148* 174* 104* 113* 111*  BUN 56* 62* 74* 87* 40* 53* 68*  CREATININE 7.76* 8.86* 10.27* 12.23* 7.33* 9.14* 10.57*  ALBUMIN 2.8* 2.1* 2.3* 1.9* 1.8* 1.9* 1.8*  CALCIUM 8.7* 8.4* 8.5* 8.1* 8.2* 8.4* 8.1*  PHOS  --  4.4 4.3 4.9* 4.9* 5.5* 6.4*  AST 12*  --   --   --   --   --   --   ALT 16  --   --   --   --   --   --    Liver Function Tests: Recent Labs  Lab 10/20/19 0659  10/24/19 0419 10/25/19 0314 10/26/19 0353  AST 12*  --   --   --   --   ALT 16  --   --   --   --   ALKPHOS 75  --   --   --   --   BILITOT 0.7  --   --   --   --   PROT 7.9  --   --   --   --   ALBUMIN 2.8*   < > 1.8* 1.9* 1.8*   < > = values in this interval not displayed.   No results for input(s): LIPASE, AMYLASE in the last 168 hours. No results for input(s): AMMONIA in the last 168 hours. CBC: Recent Labs  Lab 10/20/19 0659  10/22/19 0735 10/23/19 0640 10/24/19 0419 10/25/19 0314 10/26/19 0353  WBC 15.7*   < > 13.3* 11.1* 10.6* 10.2 9.9  NEUTROABS 12.4*  --    --   --   --   --   --   HGB 9.7*   < > 9.6* 8.1* 8.4* 7.4* 8.2*  HCT 32.8*   < > 30.0* 25.5* 26.5* 23.4* 24.5*  MCV 91.6   < > 88.2 87.9 89.2 90.7 88.4  PLT 348   < > 274 210 215 200 216   < > = values in this interval not displayed.   Cardiac Enzymes: No results for input(s): CKTOTAL, CKMB, CKMBINDEX, TROPONINI in the last 168 hours. CBG: Recent Labs  Lab 10/25/19 1224 10/25/19 1726 10/25/19 2115 10/26/19 0611 10/26/19 1102  GLUCAP 119* 177* 316* 142* 123*    Iron Studies: No results for input(s): IRON, TIBC, TRANSFERRIN, FERRITIN in the last 72 hours. Studies/Results:  Mr Hip Left Wo Contrast  Result Date: 10/25/2019 CLINICAL DATA:  Chronic left hip pain.  Pseudomonas bacteremia. EXAM: MR OF THE LEFT HIP WITHOUT CONTRAST TECHNIQUE: Multiplanar, multisequence MR imaging was performed. No intravenous contrast was administered. COMPARISON:  Prior MRI lumbar spines and CT scan abdomen/pelvis 09/10/2019 FINDINGS: Diffuse marrow abnormality as demonstrated on prior CT and MR examinations most likely due to renal osteodystrophy. This is most notable in the spine. I do not see any findings suspicious for osteomyelitis. The pubic symphysis and SI joints are intact without findings suspicious for septic arthritis. Both hips are normally located. No stress fracture or AVN. Mild bilateral hip joint degenerative changes. Diffuse edema like signal changes involving the paraspinal and psoas muscles, likely nonspecific myositis. I do not see any muscle tears or muscle lesions. The hamstring tendons are intact. There is mild bilateral hip peritendinitis but no findings for trochanteric bursitis. No significant intrapelvic abnormalities are identified. No mass or adenopathy. IMPRESSION: 1. Diffuse marrow abnormality likely due to renal osteodystrophy. No worrisome bone lesions or findings suspicious for osteomyelitis or septic arthritis. 2. Nonspecific edema like signal changes in the paraspinal and psoas  muscles. 3. No significant intrapelvic abnormalities. Electronically Signed   By: Marijo Sanes M.D.   On: 10/25/2019 07:39   . acetaminophen  1,000 mg Oral Once  . calcium carbonate  1 tablet Oral 2 times per day  . carvedilol  6.25 mg Oral BID  . chlorhexidine      . Chlorhexidine Gluconate Cloth  6 each Topical Q0600  . ciprofloxacin  500 mg Oral q1800  . doxercalciferol  1.5 mcg Intravenous Q M,W,F-HD  . feeding supplement (ENSURE ENLIVE)  237 mL Oral BID BM  . fentaNYL      . gabapentin  100 mg Oral QHS  . heparin      . insulin aspart  0-5 Units Subcutaneous QHS  . insulin aspart  0-9 Units Subcutaneous TID WC  . isosorbide mononitrate  30 mg Oral QPC lunch  . midazolam      . multivitamin  1 tablet Oral QPC lunch    BMET    Component Value Date/Time   NA 137 10/26/2019 0353   K 4.9 10/26/2019 0353   CL 97 (L) 10/26/2019 0353   CO2 23 10/26/2019 0353   GLUCOSE 111 (H) 10/26/2019 0353   BUN 68 (H) 10/26/2019 0353   CREATININE 10.57 (H) 10/26/2019 0353   CALCIUM 8.1 (L) 10/26/2019 0353   GFRNONAA 3 (L) 10/26/2019 0353   GFRAA 4 (L) 10/26/2019 0353   CBC    Component Value Date/Time   WBC 9.9 10/26/2019 0353   RBC 2.77 (L) 10/26/2019 0353   HGB 8.2 (L) 10/26/2019 0353   HCT 24.5 (L) 10/26/2019 0353   PLT 216 10/26/2019 0353   MCV 88.4 10/26/2019 0353   MCH 29.6 10/26/2019 0353   MCHC 33.5 10/26/2019 0353   RDW 16.9 (H) 10/26/2019 0353   LYMPHSABS 2.1 10/20/2019 0659   MONOABS 1.0 10/20/2019 0659   EOSABS 0.0 10/20/2019 0659   BASOSABS 0.0 10/20/2019 0659      Assessment/ Plan:  1. 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 S/p removal of [facility] 10/23/19. Appreciate ID consult who recommended TEE which revealed MV vegetation c/w endocarditis. Also has RIJ thrombus so septic thrombophlebitis also possible.   1. TEE reveals large MV vegetation consistent with SBE.  ID and Cardiology following 2. Has new TDC placed by IR  today 3. Continue  to follow blood cultures  4. Await new recommendations from Cardiology and ID. 2. Acute ischemic CVA- MRI revealed R basal ganglia infarcts and she has left-sided deficits. Neuro following. CIR recommended. 3. ESRDnormally MWF but delayed due to [facility] "holiday".  Plan for HD today after [facility] and again Saturday.  Will get back on regular schedule Monday  4. Anemia:stable 5. CKD-MBD:cont with homemeds 6. Nutrition:renal diet 7. Hypertension:stable  Donetta Potts, MD [employer] [phone]

## 2019-10-27 ENCOUNTER — Inpatient Hospital Stay (HOSPITAL_COMMUNITY): Payer: Medicare PPO

## 2019-10-27 ENCOUNTER — Encounter (HOSPITAL_COMMUNITY): Payer: Self-pay | Admitting: Interventional Radiology

## 2019-10-27 DIAGNOSIS — I339 Acute and subacute endocarditis, unspecified: Secondary | ICD-10-CM

## 2019-10-27 DIAGNOSIS — R471 Dysarthria and anarthria: Secondary | ICD-10-CM

## 2019-10-27 DIAGNOSIS — N186 End stage renal disease: Secondary | ICD-10-CM

## 2019-10-27 DIAGNOSIS — I340 Nonrheumatic mitral (valve) insufficiency: Secondary | ICD-10-CM

## 2019-10-27 DIAGNOSIS — Z992 Dependence on renal dialysis: Secondary | ICD-10-CM

## 2019-10-27 LAB — HEPARIN LEVEL (UNFRACTIONATED): Heparin Unfractionated: 0.33 IU/mL (ref 0.30–0.70)

## 2019-10-27 LAB — GLUCOSE, CAPILLARY
Glucose-Capillary: 108 mg/dL — ABNORMAL HIGH (ref 70–99)
Glucose-Capillary: 184 mg/dL — ABNORMAL HIGH (ref 70–99)
Glucose-Capillary: 183 mg/dL — ABNORMAL HIGH (ref 70–99)
Glucose-Capillary: 165 mg/dL — ABNORMAL HIGH (ref 70–99)

## 2019-10-27 LAB — RENAL FUNCTION PANEL
Albumin: 1.9 g/dL — ABNORMAL LOW (ref 3.5–5.0)
Anion gap: 15 (ref 5–15)
BUN: 20 mg/dL (ref 8–23)
CO2: 24 mmol/L (ref 22–32)
Calcium: 8 mg/dL — ABNORMAL LOW (ref 8.9–10.3)
Chloride: 95 mmol/L — ABNORMAL LOW (ref 98–111)
Creatinine, Ser: 4.76 mg/dL — ABNORMAL HIGH (ref 0.44–1.00)
GFR calc Af Amer: 10 mL/min — ABNORMAL LOW (ref >60)
GFR calc non Af Amer: 9 mL/min — ABNORMAL LOW (ref >60)
Glucose, Bld: 128 mg/dL — ABNORMAL HIGH (ref 70–99)
Phosphorus: 3.6 mg/dL (ref 2.5–4.6)
Potassium: 3.6 mmol/L (ref 3.5–5.1)
Sodium: 134 mmol/L — ABNORMAL LOW (ref 135–145)

## 2019-10-27 LAB — CBC
HCT: 25.6 % — ABNORMAL LOW (ref 36.0–46.0)
Hemoglobin: 8.2 g/dL — ABNORMAL LOW (ref 12.0–15.0)
MCH: 28.4 pg (ref 26.0–34.0)
MCHC: 32 g/dL (ref 30.0–36.0)
MCV: 88.6 fL (ref 80.0–100.0)
Platelets: 200 10*3/uL (ref 150–400)
RBC: 2.89 MIL/uL — ABNORMAL LOW (ref 3.87–5.11)
RDW: 16.5 % — ABNORMAL HIGH (ref 11.5–15.5)
WBC: 11.6 10*3/uL — ABNORMAL HIGH (ref 4.0–10.5)
nRBC: 0 % (ref 0.0–0.2)

## 2019-10-27 MED ORDER — WARFARIN SODIUM 2.5 MG PO TABS
5.0000 mg | ORAL_TABLET | Freq: Once | ORAL | Status: AC
  Administered 2019-10-27: 5 mg via ORAL
  Filled 2019-10-27: qty 2

## 2019-10-27 MED ORDER — MORPHINE SULFATE (PF) 2 MG/ML IV SOLN
2.0000 mg | Freq: Once | INTRAVENOUS | Status: AC
  Administered 2019-10-27: 2 mg via INTRAVENOUS
  Filled 2019-10-27: qty 1

## 2019-10-27 MED ORDER — WARFARIN - PHARMACIST DOSING INPATIENT
Freq: Every day | Status: DC
  Administered 2019-10-31: 19:00:00

## 2019-10-27 MED ORDER — TECHNETIUM TC 99M EXAMETAZIME IV KIT
11.6000 | PACK | Freq: Once | INTRAVENOUS | Status: AC | PRN
  Administered 2019-10-27: 11.6 via INTRAVENOUS

## 2019-10-27 MED ORDER — CHLORHEXIDINE GLUCONATE CLOTH 2 % EX PADS
6.0000 | MEDICATED_PAD | Freq: Every day | CUTANEOUS | Status: DC
  Administered 2019-10-27: 6 via TOPICAL

## 2019-10-27 NOTE — Progress Notes (Addendum)
ANTICOAGULATION CONSULT NOTE - Follow-Up  Pharmacy Consult for Transitioning From Heparin to Warfarin Indication: RIJ DVT  No Known Allergies  Patient Measurements: Height: 5\' 7"  (170.2 cm) Weight: 140 lb 14 oz (63.9 kg) IBW/kg (Calculated) : 61.6 Heparin Dosing Weight: 63 kg  Vital Signs: Temp: 98.9 F (37.2 C) (12/04 1628) Temp Source: Oral (12/04 1628) BP: 162/82 (12/04 1628) Pulse Rate: 100 (12/04 1628)  Labs: Recent Labs    10/25/19 0314 10/25/19 1048 10/25/19 1340 10/25/19 1540 10/26/19 0353 10/27/19 0255  HGB 7.4*  --   --   --  8.2* 8.2*  HCT 23.4*  --   --   --  24.5* 25.6*  PLT 200  --   --   --  216 200  HEPARINUNFRC 0.43 0.42  --   --  0.35 0.33  CREATININE 9.14*  --   --   --  10.57* 4.76*  TROPONINIHS  --   --  84* 73*  --   --     Estimated Creatinine Clearance: 10.8 mL/min (A) (by C-G formula based on SCr of 4.76 mg/dL (H)).  Assessment: 68 yr old female with ESRD who presented on 11/27 with new CVA and is now found to have RIJ DVT and pharmacy consulted to start heparin anticoagulation - low goal and no bolus with concurrent CVA. Pt rec'd new tunneled HD catheter on 12/3 for continuation of HD.  Heparin level remains therapeutic (heparin level was 0.33 units/ml this AM); H/H stable, platelets WNL. Last INR (on admission, 11/27) was 1.0.  Goal of Therapy:  Heparin level 0.3-0.5 units/ml  INR 2.0-3.0 Monitor platelets by anticoagulation protocol: Yes   Plan:  Continue heparin infusion at 1150 units/hr Warfarin 5 mg PO X 1 tonight Discontinue heparin infusion when INR therapeutic Monitor daily heparin level, CBC, INR Monitor for signs/symptoms of bleeding  Gillermina Hu, PharmD, BCPS, [facility] Clinical Pharmacist 10/27/2019 6:29 PM

## 2019-10-27 NOTE — Evaluation (Signed)
Clinical/Bedside Swallow Evaluation [name] Details  Name: [name] MRN: [id number] Date of Birth: 19-Oct-1951  Today's Date: 10/27/2019 Time: SLP Start Time (ACUTE ONLY): 0708 SLP Stop Time (ACUTE ONLY): 0726 SLP Time Calculation (min) (ACUTE ONLY): 18 min  Past Medical History:  Past Medical History:  Diagnosis Date  . Arthritis    hands  . Constipation   . Diabetes mellitus without complication (HCC)    Type II  . ESRD (end stage renal disease) ([place])     M/W/F- Hemodialysis  . GERD (gastroesophageal reflux disease)   . Headache   . Hyperlipidemia   . Hypertension   . Irregular heart rate   . Stroke [facility])    TIA - approx 2010- no residual   Past Surgical History:  Past Surgical History:  Procedure Laterality Date  . ABDOMINAL HYSTERECTOMY    . AORTIC ARCH ANGIOGRAPHY N/A 03/28/2019   Procedure: AORTIC ARCH ANGIOGRAPHY;  Surgeon: Waynetta Sandy, MD;  Location: [place] CV [facility];  Service: Cardiovascular;  Laterality: N/A;  . AV FISTULA PLACEMENT Left 06/13/2018   Procedure: ARTERIOVENOUS (AV) FISTULA CREATION;  Surgeon: Rosetta Posner, MD;  Location: [place];  Service: Vascular;  Laterality: Left;  . AV FISTULA PLACEMENT Left 08/18/2018   Procedure: INSERTION OF 4-7MM X 45CM ARTERIOVENOUS (AV) GORE-TEX GRAFT LEFT  FOREARM;  Surgeon: Rosetta Posner, MD;  Location: [place];  Service: Vascular;  Laterality: Left;  . AV FISTULA PLACEMENT Right 04/06/2019   Procedure: INSERTION OF ARTERIOVENOUS (AV) GORE-TEX GRAFT RIGHT UPPER ARM;  Surgeon: Waynetta Sandy, MD;  Location: [place];  Service: Vascular;  Laterality: Right;  . AV FISTULA PLACEMENT Right 04/13/2019   Procedure: INSERTION OF ARTERIOVENOUS (AV) BOVINE  ARTEGRAFT GRAFT RIGHT UPPER EXTREMITY;  Surgeon: Serafina Mitchell, MD;  Location: [place];  Service: Vascular;  Laterality: Right;  . [place] REMOVAL Right 05/16/2019   Procedure: REMOVAL OF ARTERIOVENOUS GORETEX GRAFT ([place]) RIGHT ARM;  Surgeon: Angelia Mould, MD;  Location: [place];  Service: Vascular;  Laterality: Right;  . BASCILIC VEIN TRANSPOSITION Right 03/07/2019   Procedure: First Stage Bascilic Vein Transposition Right Arm;  Surgeon: Serafina Mitchell, MD;  Location: [place];  Service: Vascular;  Laterality: Right;  . CATARACT EXTRACTION Right 2005  . INSERTION OF DIALYSIS CATHETER N/A 08/18/2018   Procedure: INSERTION OF DIALYSIS CATHETER;  Surgeon: Rosetta Posner, MD;  Location: [place];  Service: Vascular;  Laterality: N/A;  . INSERTION OF DIALYSIS CATHETER Right 07/25/2019   Procedure: INSERTION OF DIALYSIS CATHETER RIGHT INTERNAL JUGULAR (TUNNELED);  Surgeon: Virl Cagey, MD;  Location: [facility];  Service: General;  Laterality: Right;  . IR FLUORO GUIDE CV LINE LEFT  10/26/2019  . IR FLUORO GUIDE CV LINE RIGHT  12/06/2018  . IR PTA ADDL CENTRAL DIALYSIS SEG THRU DIALY CIRCUIT RIGHT Right 12/06/2018  . IR REMOVAL TUN CV CATH W/O FL  10/23/2019  . IR US GUIDE VASC ACCESS LEFT  10/26/2019  . TEE WITHOUT CARDIOVERSION N/A 10/26/2019   Procedure: TRANSESOPHAGEAL ECHOCARDIOGRAM (TEE);  Surgeon: Lelon Perla, MD;  Location: [place];  Service: Cardiovascular;  Laterality: N/A;  . THROMBECTOMY AND REVISION OF ARTERIOVENTOUS (AV) GORETEX  GRAFT Left 09/29/2018   Procedure: INSERTION OF ARTERIOVENTOUS (AV) GORETEX  GRAFT ARM;  Surgeon: Waynetta Sandy, MD;  Location: [place];  Service: Vascular;  Laterality: Left;  . UPPER EXTREMITY ANGIOGRAPHY Right 03/28/2019   Procedure: UPPER EXTREMITY ANGIOGRAPHY;  Surgeon: Waynetta Sandy, MD;  Location:  [place] [facility]AS[facility];  Service: Cardiovascular;  Laterality: Right;  . VENOGRAM  10/27/2018   Procedure: Venogram;  Surgeon: Marty Heck, MD;  Location: [place] [facility];  Service: Cardiovascular;;  bilateral arm   HPI:  [name] is a 68 y.o. female with history of ESRD ( on dialysis M-W-F), TIA 2010, HTN, HLD, Ha, DM2 who presented to  [facility] with c/o  dizziness, left arm weakness and left leg numbness. MRI revealed acute R basal ganglia infarct. In addition, she has R IJ DVT and spiked a fever 11/27 with concern for sepsis.   Assessment / Plan / Recommendation Clinical Impression  Bedside swallow evaluation requested d/t acute stroke like symptoms after HD on 10/26/19. CT head STAT revealed evidence of a new small left frontal lobe white matter infarct since the MRI on 10/20/2019, near the left frontal horn. Bedside swallow evaluation completed with no s/s of oropharyngeal dysphagia or aspiraiton when consuming puree and solid trials as well as wehn consuming thin liquids via cup and straw. AT this time, ST to follow for cognition only. Nursing aware of order to return pt to regular diet with thin liquids.  SLP Visit Diagnosis: Attention and concentration deficit;Dysphagia, unspecified (R13.10) Attention and concentration deficit following: Cerebral infarction    Aspiration Risk  No limitations    Diet Recommendation Regular;Thin liquid   Liquid Administration via: Cup;Straw Medication Administration: Whole meds with liquid Supervision: Staff to assist with self feeding Compensations: Minimize environmental distractions;Slow rate;Small sips/bites Postural Changes: Seated upright at 90 degrees    Other  Recommendations Oral Care Recommendations: Oral care BID   Follow up Recommendations Inpatient Rehab      Frequency and Duration            Prognosis        Swallow Study   General Date of Onset: 10/27/19 HPI: [name] is a 68 y.o. female with history of ESRD ( on dialysis M-W-F), TIA 2010, HTN, HLD, Ha, DM2 who presented to  [facility] with c/o dizziness, left arm weakness and left leg numbness. MRI revealed acute R basal ganglia infarct. In addition, she has R IJ DVT and spiked a fever 11/27 with concern for sepsis. Type of Study: Bedside Swallow Evaluation Previous Swallow Assessment: none in chart Diet Prior to this  Study: NPO Temperature Spikes Noted: No Respiratory Status: Room air History of Recent Intubation: No Behavior/Cognition: Pleasant mood;Cooperative Oral Cavity Assessment: Within Functional Limits Oral Care Completed by SLP: No Oral Cavity - Dentition: Edentulous Vision: Functional for self-feeding Self-Feeding Abilities: Needs assist(d/t fatigue) [name] Positioning: Upright in bed Baseline Vocal Quality: Normal Volitional Cough: Weak Volitional Swallow: Able to elicit    Oral/Motor/Sensory Function Overall Oral Motor/Sensory Function: Mild impairment   Ice Chips Ice chips: Not tested   Thin Liquid Thin Liquid: Within functional limits Presentation: Cup;Straw;Self Fed    Nectar Thick Nectar Thick Liquid: Not tested   Honey Thick Honey Thick Liquid: Not tested   Puree Puree: Within functional limits Presentation: Self Fed;Spoon   Solid     Solid: Within functional limits Presentation: Self Fed      [place]i Coupland 10/27/2019,5:26 PM

## 2019-10-27 NOTE — Progress Notes (Signed)
PROGRESS NOTE  [name] [id number] DOB: [birth year] DOA: 10/20/2019 PCP: Diana Hilding, MD  Brief History   Diana McCorkleis a 68 y.o.femalewith medical history ofESRD (MWF), diabetes mellitus type 2, stroke, hypertension, hyperlipidemia presented with left-sided weakness,  when she woke up to get ready for dialysis at 4 AM on 10/20/2019, the patient noted left-sided weakness and had difficulty ambulating. Shw was found to have right basal ganglia infarcts on MRI. Her hospitalization complicated by fevers of 101, and finding of  right IJ DVT on Dopplers. She will require 3 months of anticoagulation for this. She is now found to have recurrent Pseudomonas bacteremia. Right IJ  HD catheter was removed on 10/23/2019. TEE performed on 10/26/2019 demonstrates large vegetation of mitral valve. CTS consulted. Recent history of stroke raises a possible barrier to near-term surgical intervention.  Permissive hypertension is in effect.  Pt had new tunneled catheter placed on 10/26/2019 for the continuation of HD. She went to HD last night. Following HD she had altered mental status. MRI was performed. It demonstrated chronic multiple small ischemic strokes and interval development of a left 4 mm cortical infarct.   Dr. Prescott Romero for CTS feels that the best course at this time is continuation of IV antibiotics. He does not feel that there is a currently a role for mitral valve repair/replacement due to risk for recurrent infection of a new bioprosthetic valve. She will need to have negative blood cultures for 3 weeks before surgery could be considered. She will also need improvement in her functional/nutritional status.  Dental Panorex is pending. May need dental evaluation.  Consultants  . Infectious disease . Nephrology . Neurology . Cardiology . Cardiothoracic surgery  Procedures  . Dialysis . Placement of new HD Tunnelled catheter  Antibiotics   Anti-infectives (From admission,  onward)   Start     Dose/Rate Route Frequency Ordered Stop   10/26/19 1800  ciprofloxacin (CIPRO) tablet 500 mg     500 mg Oral Daily-1800 10/26/19 0924     10/26/19 1541  ceFAZolin (ANCEF) 2-4 GM/100ML-% IVPB    Note to Pharmacy: [name]   : cabinet override      10/26/19 1541 10/26/19 1642   10/24/19 2000  ceFEPIme (MAXIPIME) 1 g in sodium chloride 0.9 % 100 mL IVPB     1 g 200 mL/hr over 30 Minutes Intravenous Every 24 hours 10/23/19 1100     10/23/19 1200  ceFEPIme (MAXIPIME) 2 g in sodium chloride 0.9 % 100 mL IVPB     2 g 200 mL/hr over 30 Minutes Intravenous Every Mon (Hemodialysis) 10/23/19 1044 10/23/19 1217   10/23/19 0745  ceFEPIme (MAXIPIME) 2 g in sodium chloride 0.9 % 100 mL IVPB  Status:  Discontinued     2 g 200 mL/hr over 30 Minutes Intravenous  Once 10/23/19 0736 10/23/19 1115     Subjective  The patient is resting comfortably. No new complaints. Son, [name] is at bedside.  Objective   Vitals:  Vitals:   10/27/19 1158 10/27/19 1628  BP: (!) 142/96 (!) 162/82  Pulse: 99 100  Resp: 17 18  Temp: 99.5 F (37.5 C) 98.9 F (37.2 C)  SpO2: 97% 98%   Exam:  Constitutional:  . The patient is awake and alert. She is confused. No acute distress. Eyes:  . EOMBI . PERRLA . Normal lids and conjunctivae Respiratory:  . No increased work of breathing. . No wheezes, rales, or rhonchi . No tactile fremitus Cardiovascular:  . Regular  rate and rhythm . No murmurs, ectopy, or gallups. . No lateral PMI. No thrills. Abdomen:  . Abdomen is soft, non-tender, non-distended . No hernias, masses, or organomegaly . Normoactive bowel sounds.  Musculoskeletal:  . No cyanosis, clubbing, or edema Skin:  . No rashes, lesions, ulcers . palpation of skin: no induration or nodules Neurologic:  . CN 2-12 intact . Sensation all 4 extremities intact Psychiatric:  . Mental status: Confused  I have personally reviewed the following:   Today's Data  . Vitals, BMP, CBC  .  Micro Data  . Blood cultures: 2/2 positive for pseudomonas aeruginosa . Surveillance cultures 2/2 (10/24/2019) Have had no growth.  Imaging  . MRI left hip:  o No findings suspicious for osteomyelitis or septic arthritis o Edema of the paraspinal and psoas muscles o Osteodystrophy . MRI brain 10/26/2019: New left cortical infarct.  Scheduled Meds: . acetaminophen  1,000 mg Oral Once  . calcium carbonate  1 tablet Oral 2 times per day  . carvedilol  6.25 mg Oral BID  . Chlorhexidine Gluconate Cloth  6 each Topical Q0600  . ciprofloxacin  500 mg Oral q1800  . doxercalciferol  1.5 mcg Intravenous Q M,W,F-HD  . feeding supplement (ENSURE ENLIVE)  237 mL Oral BID BM  . gabapentin  100 mg Oral QHS  . insulin aspart  0-5 Units Subcutaneous QHS  . insulin aspart  0-9 Units Subcutaneous TID WC  . isosorbide mononitrate  30 mg Oral QPC lunch  . multivitamin  1 tablet Oral QPC lunch   Continuous Infusions: . sodium chloride Stopped (10/20/19 1317)  . ceFEPime (MAXIPIME) IV 1 g (10/27/19 0043)  . heparin 1,150 Units/hr (10/26/19 2326)    Active Problems:   ESRD needing dialysis ([place])   Anemia of chronic renal failure   Type 2 diabetes mellitus with diabetic nephropathy (HCC)   Bacteremia due to Pseudomonas   Acute ischemic stroke ([place])   Acute left-sided weakness   LOS: 7 days   A & P  Acute ischemic stroke: quite possibly due to septic emboli from large mitral valve vegetation.  MRI notes right basal ganglia infarcts, continues to have significant left-sided deficits. TEE performed today demonstrates a large vegetation on the mitral valve with possible destruction of the valve. Carotid duplex is unremarkable, no PFO noted on TCD bubble study. Patient was on aspirin and Plavix at baseline which was continued however given concomitant new finding of acute DVT in her right IJ she was started on IV heparin without bolus, after discussion with neurology Dr. Erlinda Romero. Aspirin and Plavix  discontinued 11/29. Hemoglobin A1c is 7.6, LDL is 65. I appreciate the assistance of neurology. CIR has been recommended and they have been consulted. New left cortical infarct identified on MRI last night.  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 It was replaced again this afternoon. Continue IV Cefepime. MRI of left hip demonstrated osteodystrophy, but no signs of osteomyelitis. There was some edema associated with the paraspinal myscles and the psoas on the left. Back in the end of August when she had the first episode of  Pseudomonas bacteremia and there was concern for possible discitis of L3-L4, this was  later felt to be renal spondyloarthropathy on repeat imaging. Monitor. I appreciate infectious disease's assistance. Surveillance cultures have had no growth.  Mitral Valve Vegetation: Dr. Prescott Romero for CTS feels that the best course at this time is continuation of IV antibiotics. He does not feel that there is a currently a role for mitral valve repair/replacement due to risk for recurrent infection of a new bioprosthetic valve. She will need to have negative blood cultures for 3 weeks before surgery could be considered. She will also need improvement in her functional/nutritional status. Continue cefepime.  Right IJ acute DVT: This is secondary to her HD catheter, discussed with renal, given concurrent bacteremia will need HD catheter removal. Treat with anticoagulation for 3 months after catheter removal. As ESRD options are limited, we will continue IV heparin without bolus and aim for a lower PTT, and  hold off on Coumadin until new  dialysis access issues sorted out. Coumadin has been restarted.  ESRD: Nephrology consulted, usually dialyzes Monday Wednesday Friday. Right IJ HD catheter removed 11/30, last HD was yesterday 11/30. HD catheter placed again on 10/26/2019. HD performed yesterday evening.  Diabetes mellitus type 2: Hemoglobin A1c is 7.6. She is on a NovoLog sliding scale.  Essential hypertension: Restart Coreg, amlodipine on hold. Hydralazine prn SBP >220  Anemia of chronic disease: Per renal.  I have seen and examined this patient myself. I have spent 35 minutes in her evaluation and care.  DVT prophylaxis: Heparin IV Code Status: DNR Family Communication: Son is at bedside. Disposition Plan: Possibly CIR when above work-up completed  Riggin Cuttino, DO [facility] Direct contact: see [email or web address]  7PM-7AM contact night coverage as above 10/27/2019, 5:53 PM  LOS: 5 days

## 2019-10-27 NOTE — Progress Notes (Signed)
Neurology called for stuttering speech, stat CT head was obtained to r.o hemorrhage as patient on heparin drip.  CT head unremarkable for hemorrhage, shows interval development of small cortical infarction in the left frontal lobe, likely in the setting of mitral valve vegetation. Continue treatment with antibiotics. Continue anticoagulation given benefit of prevention of embolic stroke would outweigh risk of bleeding.

## 2019-10-27 NOTE — Significant Event (Addendum)
Rapid Response Event Note  Overview:Called d/t AMS post HD treatment. Pt here with R basal ganglia infarcts, NIH originally 4, last NIH charted was 6. Time Called: 0027 Arrival Time: 0030 Event Type: Neurologic  Initial Focused Assessment: Pt laying in bed, confused, stuttering. Pt will follow simple commands, move all extremities just profoundly weak and inattentive. Pupils 4 and brisk. NIH-12 for weakness in all extremities, L gaze preference, and confusion. HR-106, BP-172/91, RR-14, SpO2-96% on RA, CBG-109.  Interventions: CBG-109 CT head STAT-1. Evidence of a new small left frontal lobe white matter infarct since the MRI on 10/20/2019, near the left frontal horn. No associated hemorrhage or mass effect. 2. Expected appearance of the recent right basal ganglia and right corona radiata infarcts. Dr. Lorraine Lax to bedside to assess pt.  Plan of Care (if not transferred): Alert NP of happenings. Continue to monitor pt. Call RRT if further assistance needed. Event Summary:   at    Name of Consulting Physician Notified: Dr. Lorraine Lax at [address] [place]

## 2019-10-27 NOTE — Progress Notes (Signed)
Patient ID: Quinley Jesko, female   DOB: June 10, 1951, 68 y.o.   MRN: [id number]  [facility] Progress Note   Assessment/ Plan:   1.  Recurrent Pseudomonas bacteremia with evidence of mitral valve endocarditis prompting exchange of hemodialysis catheter awaiting repeat blood cultures and recommendations by ID. 2. ESRD: We will continue hemodialysis on TTS schedule.  She is euvolemic on exam 3. Anemia: Without overt blood loss, continue to monitor on ESA 4. CKD-MBD: Phosphorus level within acceptable range, monitor on renal diet/binders 5.  Acute CVA with evidence of chronic ischemia/old infarcts: Ongoing management per neurology, evidence of mitral valve endocarditis. 6. Hypertension: Blood pressure elevated at baseline, status post CVA with judicious blood pressure lowering to limit extension of penumbra.   Subjective:   Acute events from overnight noted, altered mental status prompting repeat CT scan of the head that shows new small left frontal lobe infarct.  Went hemodialysis yesterday-has no recollection of this.   Objective:   BP (!) 163/86 (BP Location: Left Arm)   Pulse 100   Temp 99 F (37.2 C) (Oral)   Resp 18   Ht 5\' 7"  (1.702 m)   Wt 63.9 kg   SpO2 100%   BMI 22.06 kg/m   Physical Exam: Gen: Resting in bed, listening to television CVS: Regular tachycardia, S1 and S2 normal Resp: Diminished breath sounds over bases, no distinct rales Abd: Soft, flat, nontender Ext: No lower extremity edema.  Right IJ [facility]  Labs: BMET Recent Labs  Lab 10/21/19 0405 10/22/19 0735 10/23/19 0640 10/24/19 0419 10/25/19 0314 10/26/19 0353 10/27/19 0255  NA 140 138 136 136 137 137 134*  K 4.9 4.8 4.8 4.6 4.8 4.9 3.6  CL 100 98 98 98 98 97* 95*  CO2 24 22 22 24  21* 23 24  GLUCOSE 131* 148* 174* 104* 113* 111* 128*  BUN 62* 74* 87* 40* 53* 68* 20  CREATININE 8.86* 10.27* 12.23* 7.33* 9.14* 10.57* 4.76*  CALCIUM 8.4* 8.5* 8.1* 8.2* 8.4* 8.1* 8.0*  PHOS 4.4 4.3 4.9*  4.9* 5.5* 6.4* 3.6   CBC Recent Labs  Lab 10/24/19 0419 10/25/19 0314 10/26/19 0353 10/27/19 0255  WBC 10.6* 10.2 9.9 11.6*  HGB 8.4* 7.4* 8.2* 8.2*  HCT 26.5* 23.4* 24.5* 25.6*  MCV 89.2 90.7 88.4 88.6  PLT 215 200 216 200    Medications:    . acetaminophen  1,000 mg Oral Once  . calcium carbonate  1 tablet Oral 2 times per day  . carvedilol  6.25 mg Oral BID  . Chlorhexidine Gluconate Cloth  6 each Topical Q0600  . ciprofloxacin  500 mg Oral q1800  . doxercalciferol  1.5 mcg Intravenous Q M,W,F-HD  . feeding supplement (ENSURE ENLIVE)  237 mL Oral BID BM  . gabapentin  100 mg Oral QHS  . heparin      . insulin aspart  0-5 Units Subcutaneous QHS  . insulin aspart  0-9 Units Subcutaneous TID WC  . isosorbide mononitrate  30 mg Oral QPC lunch  . multivitamin  1 tablet Oral QPC lunch   Elmarie Shiley, MD 10/27/2019, 9:12 AM

## 2019-10-27 NOTE — Consult Note (Addendum)
[address]       [place] [address]             [phone]        Clea Meadowcroft [place] Medical Record #[id number] Date of Birth: 17-Mar-1951   Referring- Benny Lennert, MD Primary Care: Manon Hilding, MD Primary Cardiologist:No primary care provider on file.  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    History of Present Illness:     Patient examined, images of TEE and most recent chest x-ray personally reviewed as well as the report of the patient's recent brain MRI exams.  68 year old AA female on hemodialysis since September 2019.  She developed infected AV access graft and catheter recurrently since October with positive blood cultures.  Chest wall echo studies in early September were negative for vegetation as was a chest wall echo on the day of admission.  A subsequent TEE was performed yesterday showing a 8 mm vegetation on the mitral valve posterior leaflet with mild-moderate mitral regurgitation.  She has been treated with both IV antibiotics during dialysis and oral Cipro for Pseudomonas bacteremia.  She was admitted to the hospital 1 week ago with right-sided weakness and blood cultures recurrent positive for Pseudomonas.  Brain MRI showed multiple acute right basal ganglia infarcts.  The most recent dialysis catheter was removed from the left IJ.  Yesterday new tunneled right subclavian vein dialysis cath was placed and patient underwent hemodialysis last night.  After the HD session the patient had altered mental status confusion and underwent urgent brain MRI.  This shows chronic multiple small ischemic strokes but an interval development of a left 4 mm cortical infarct.  The patient is being treated with IV Maxipime and oral Cipro.  Blood cultures taken December 1 are no growth so far.  The  patient's past history is negative for heart disease, no history of murmur, arrhythmia, MI, or heart failure.  The patient denies prior history of thoracic trauma, pneumothorax, rib fractures.  The patient has no history of dental disease.  She is edentulous in her maxilla.  She has some left mandibular teeth with probable dental disease present.  Panorex x-ray is pending.  The patient has developed severe debility and weakness over the past 3 months with ongoing recurrent bacteremia, dialysis access procedures, and fever.  She has lost weight.  She has not ambulated in several days.  Current Activity/ Functional Status: Patient is a Monday Wednesday Friday hemodialysis patient. She is restricted to bed. She is having chronic back pain.  MR spine shows no evidence of osteomyelitis Patient is currently DNR  Zubrod Score: At the time of surgery this patients most appropriate activity status/level should be described as: _0     0    Normal activity, no symptoms _1     1    Restricted in physical strenuous activity but ambulatory, able to do out light work _2     2    Ambulatory and capable of self care, unable to do work activities, up and about                 more than 50%  Of the time                            _3   3    Only limited self care, in bed greater than 50% of waking hours _0     4    Completely disabled, no self care, confined to bed or chair _1     5    Moribund  Past Medical History:  Diagnosis Date   Arthritis    hands   Constipation    Diabetes mellitus without complication ([place])    Type II   ESRD (end stage renal disease) ([place])     M/W/F- Hemodialysis   GERD (gastroesophageal reflux disease)    Headache    Hyperlipidemia    Hypertension    Irregular heart rate    Stroke ([place])    TIA - approx 2010- no residual    Past Surgical History:  Procedure Laterality Date   ABDOMINAL HYSTERECTOMY     AORTIC ARCH ANGIOGRAPHY N/A 03/28/2019   Procedure: AORTIC  ARCH ANGIOGRAPHY;  Surgeon: Waynetta Sandy, MD;  Location: [facility];  Service: Cardiovascular;  Laterality: N/A;   AV FISTULA PLACEMENT Left 06/13/2018   Procedure: ARTERIOVENOUS (AV) FISTULA CREATION;  Surgeon: Rosetta Posner, MD;  Location: [facility];  Service: Vascular;  Laterality: Left;   AV FISTULA PLACEMENT Left 08/18/2018   Procedure: INSERTION OF 4-7MM X 45CM ARTERIOVENOUS (AV) GORE-TEX GRAFT LEFT  FOREARM;  Surgeon: Rosetta Posner, MD;  Location: [facility];  Service: Vascular;  Laterality: Left;   AV FISTULA PLACEMENT Right 04/06/2019   Procedure: INSERTION OF ARTERIOVENOUS (AV) GORE-TEX GRAFT RIGHT UPPER ARM;  Surgeon: Waynetta Sandy, MD;  Location: [facility];  Service: Vascular;  Laterality: Right;   AV FISTULA PLACEMENT Right 04/13/2019   Procedure: INSERTION OF ARTERIOVENOUS (AV) BOVINE  ARTEGRAFT GRAFT RIGHT UPPER EXTREMITY;  Surgeon: Serafina Mitchell, MD;  Location: [facility];  Service: Vascular;  Laterality: Right;   [place] REMOVAL Right 05/16/2019   Procedure: REMOVAL OF ARTERIOVENOUS GORETEX GRAFT ([place]) RIGHT ARM;  Surgeon: Angelia Mould, MD;  Location: [facility];  Service: Vascular;  Laterality: Right;   [place] Right 03/07/2019   Procedure: First Stage Bascilic Vein Transposition Right Arm;  Surgeon: Serafina Mitchell, MD;  Location: [facility];  Service: Vascular;  Laterality: Right;   CATARACT EXTRACTION Right 2005   INSERTION OF DIALYSIS CATHETER N/A 08/18/2018   Procedure: INSERTION OF DIALYSIS CATHETER;  Surgeon: Rosetta Posner, MD;  [facility];  Service: Vascular;  Laterality: N/A;   INSERTION OF DIALYSIS CATHETER Right 07/25/2019   Procedure: INSERTION OF DIALYSIS CATHETER RIGHT INTERNAL JUGULAR (TUNNELED);  Surgeon: Virl Cagey, MD;  Location: [facility];  Service: General;  Laterality: Right;   IR FLUORO GUIDE CV LINE LEFT  10/26/2019   IR FLUORO GUIDE CV LINE RIGHT  12/06/2018   IR PTA ADDL CENTRAL DIALYSIS SEG THRU DIALY  CIRCUIT RIGHT Right 12/06/2018   IR REMOVAL TUN CV CATH W/O FL  10/23/2019   IR US GUIDE VASC ACCESS LEFT  10/26/2019   TEE WITHOUT CARDIOVERSION N/A 10/26/2019   Procedure: TRANSESOPHAGEAL ECHOCARDIOGRAM (TEE);  Surgeon: Lelon Perla, MD;  Location: [facility];  Service: Cardiovascular;  Laterality: N/A;   THROMBECTOMY AND REVISION OF ARTERIOVENTOUS (AV) GORETEX  GRAFT Left 09/29/2018   Procedure: INSERTION OF ARTERIOVENTOUS (AV) GORETEX  GRAFT ARM;  Surgeon: Waynetta Sandy, MD;  Location: [facility];  Service: Vascular;  Laterality: Left;   UPPER EXTREMITY ANGIOGRAPHY Right 03/28/2019   Procedure: UPPER EXTREMITY ANGIOGRAPHY;  Surgeon: Waynetta Sandy, MD;  Location: [facility];  Service: Cardiovascular;  Laterality: Right;   VENOGRAM  10/27/2018   Procedure: Venogram;  Surgeon: Marty Heck, MD;  Location: [facility];  Service: Cardiovascular;;  bilateral arm    Social History   Tobacco Use  Smoking Status Never Smoker  Smokeless Tobacco Never Used    Social History   Substance and Sexual Activity  Alcohol Use Never   Frequency: Never     No Known Allergies  Current Facility-Administered Medications  Medication Dose Route Frequency Provider Last Rate Last Dose   0.9 %  sodium chloride infusion   Intravenous Continuous Lelon Perla, MD   Stopped at 10/20/19 1317   acetaminophen (TYLENOL) tablet 650 mg  650 mg Oral Q4H PRN Lelon Perla, MD   650 mg at 10/25/19 1959   Or   acetaminophen (TYLENOL) 160 MG/5ML solution 650 mg  650 mg Per Tube Q4H PRN Lelon Perla, MD       Or   acetaminophen (TYLENOL) suppository 650 mg  650 mg Rectal Q4H PRN Lelon Perla, MD       acetaminophen (TYLENOL) tablet 1,000 mg  1,000 mg Oral Once Lelon Perla, MD       calcium carbonate (TUMS - dosed in mg elemental calcium) chewable tablet 200 mg of elemental calcium  1 tablet Oral 2 times per day Lelon Perla, MD   200  mg of elemental calcium at 10/25/19 1744   carvedilol (COREG) tablet 6.25 mg  6.25 mg Oral BID Lelon Perla, MD   6.25 mg at 10/27/19 1048   ceFEPIme (MAXIPIME) 1 g in sodium chloride 0.9 % 100 mL IVPB  1 g Intravenous Q24H Lelon Perla, MD 200 mL/hr at 10/27/19 0043 1 g at 10/27/19 0043   Chlorhexidine Gluconate Cloth 2 % PADS 6 each  6 each Topical Q0600 Lelon Perla, MD   6 each at 10/27/19 2706   ciprofloxacin (CIPRO) tablet 500 mg  500 mg Oral q1800 Wiota Callas, NP   500 mg at 10/26/19 1724   doxercalciferol (HECTOROL) injection 1.5 mcg  1.5 mcg Intravenous Q M,W,F-HD Lelon Perla, MD   1.5 mcg at 10/23/19 1142   feeding supplement (ENSURE ENLIVE) (ENSURE ENLIVE) liquid 237 mL  237 mL Oral BID BM Swayze, Ava, DO   237 mL at 10/27/19 1048   fluticasone (FLONASE) 50 MCG/ACT nasal spray 1 spray  1 spray Each Nare Daily PRN Lelon Perla, MD       gabapentin (NEURONTIN) capsule 100 mg  100 mg Oral QHS Lelon Perla, MD   100 mg at 10/25/19 2207   heparin ADULT infusion 100 units/mL (25000 units/257m sodium chloride 0.45%)  1,150 Units/hr Intravenous Continuous CLelon Perla MD 11.5 mL/hr at 10/26/19 2326 1,150 Units/hr at 10/26/19 2326   hydrALAZINE (APRESOLINE) injection 10 mg  10 mg Intravenous Q6H PRN CLelon Perla MD       insulin aspart (novoLOG) injection 0-5 Units  0-5 Units Subcutaneous QHS CLelon Perla MD   4 Units at 10/25/19 2207   insulin aspart (novoLOG) injection 0-9 Units  0-9 Units Subcutaneous TID WC CLelon Perla MD   1 Units at 10/26/19 1124   isosorbide mononitrate (IMDUR) 24 hr tablet 30 mg  30 mg Oral QPC lunch CLelon Perla MD   30 mg at 10/26/19 1248   multivitamin (RENA-VIT) tablet 1 tablet  1 tablet Oral QPC lunch CLelon Perla MD  1 tablet at 10/25/19 2207   ondansetron (ZOFRAN) injection 4 mg  4 mg Intravenous Q6H PRN Lelon Perla, MD   4 mg at 10/24/19 4034   oxyCODONE (Oxy  IR/ROXICODONE) immediate release tablet 5 mg  5 mg Oral Q6H PRN Lelon Perla, MD   5 mg at 10/25/19 0831   senna-docusate (Senokot-S) tablet 1 tablet  1 tablet Oral QHS PRN Lelon Perla, MD       Facility-Administered Medications Ordered in Other Encounters  Medication Dose Route Frequency Provider Last Rate Last Dose   0.9 %  sodium chloride infusion   Intravenous Continuous Monia Sabal, PA-C        Medications Prior to Admission  Medication Sig Dispense Refill Last Dose   Accu-Chek Softclix Lancets lancets       Alcohol Swabs (B-D SINGLE USE SWABS REGULAR) PADS       amLODipine (NORVASC) 10 MG tablet Take 10 mg by mouth daily after lunch.    10/19/2019 at Unknown time   aspirin EC 81 MG tablet Take 81 mg by mouth daily after lunch.    10/19/2019 at 1200   calcium carbonate (TUMS - DOSED IN MG ELEMENTAL CALCIUM) 500 MG chewable tablet Chew 1 tablet by mouth 2 (two) times daily. With lunch & supper   10/19/2019 at Unknown time   carvedilol (COREG) 6.25 MG tablet Take 6.25 mg by mouth 2 (two) times daily.   10/20/2019 at 0400   clopidogrel (PLAVIX) 75 MG tablet Take 1 tablet (75 mg total) by mouth daily. 30 tablet 11 10/19/2019 at 1200   fluticasone (FLONASE) 50 MCG/ACT nasal spray Place 1 spray into both nostrils daily as needed for allergies or rhinitis.       gabapentin (NEURONTIN) 100 MG capsule Take 1 capsule (100 mg total) by mouth at bedtime. 30 capsule 2 10/19/2019 at Unknown time   insulin NPH-regular Human (NOVOLIN 70/30) (70-30) 100 UNIT/ML injection Inject 10-20 Units into the skin See admin instructions. Use twice daily per sliding scale: Blood sugar less than 150=12 units & Blood sugar 200 or more=20 units   10/19/2019 at 1700   methocarbamol (ROBAXIN) 500 MG tablet Take 1 tablet by mouth 3 (three) times daily as needed.   10/19/2019 at Unknown time   multivitamin (RENA-VIT) TABS tablet Take 1 tablet by mouth daily after lunch.    10/19/2019 at 1200    pantoprazole (PROTONIX) 40 MG tablet Take 1 tablet by mouth daily as needed.       Polyethylene Glycol 3350 (PEG 3350) 17 GM/SCOOP POWD Take 17 g by mouth daily as needed.    10/19/2019 at Unknown time   traMADol (ULTRAM) 50 MG tablet Take 50 mg by mouth 4 (four) times daily as needed.   10/19/2019 at Unknown time   isosorbide mononitrate (IMDUR) 30 MG 24 hr tablet Take 30 mg by mouth daily after lunch.    Not Taking at Unknown time   predniSONE (DELTASONE) 20 MG tablet Take 20-40 mg by mouth as directed. Take 50m by mouth daily for 7 days, then takes 214mby mouth daily for 7 days, then stop   10/18/2019    Family History  Problem Relation Age of Onset   Heart murmur Mother    Heart failure Mother    Diabetes Mother    Cirrhosis Father    Alcoholism Father      Review of Systems:   ROS      Cardiac Review of Systems: Y or  [    ]=  no  Chest Pain [    ]  Resting SOB [   ] Exertional SOB  [ y ]  Orthopnea [  ]   Pedal Edema [   ]    Palpitations [  ] Syncope  [  ]   Presyncope [   ]  General Review of Systems: [Y] = yes [  ]=no Constitional: recent weight change [  y]; anorexia [ y ]; fatigue Blue.Reese  ]; nausea [  ]; night sweats [  ]; fever Blue.Reese  ]; or chills [  ]                                                               Dental: Last Dentist visit: Unknowni  Eye : blurred vision [  ]; diplopia [   ]; vision changes [  ];  Amaurosis fugax[  ]; Resp: cough [  ];  wheezing[  ];  hemoptysis[  ]; shortness of breath[  ]; paroxysmal nocturnal dyspnea[  ]; dyspnea on exertion[  ]; or orthopnea[  ];  GI:  gallstones[  ], vomiting[  ];  dysphagia[  ]; melena[  ];  hematochezia [  ]; heartburn[  ];   Hx of  Colonoscopy[  ]; GU: kidney stones [  ]; hematuria[  ];   dysuria [  ];  nocturia[  ];  history of     obstruction [  ]; urinary frequency [  ]             Skin: rash, swelling[  ];, hair loss[  ];  peripheral edema[  ];  or itching[  ]; Musculosketetal: myalgias[  ];  joint  swelling[  ];  joint erythema[  ];  joint pain[  ];  back pain[y  ];  Heme/Lymph: bruising[  ];  bleeding[  ];  anemia[  ];  Neuro: TIA[  ];  headaches[  ];  stroke[  ];  vertigo[  ];  seizures[  ];   paresthesias[  ];  difficulty walking[  ];  Psych:depression[  ]; anxiety[  ];  Endocrine: diabetes[  ];  thyroid dysfunction[  ];            1}      Physical Exam: BP (!) 163/86 (BP Location: Left Arm)    Pulse 100    Temp 99 F (37.2 C) (Oral)    Resp 18    Ht _0  (1.702 m)    Wt 63.9 kg    SpO2 100%    BMI 22.06 kg/m       Physical Exam  General: elderly frail AA female breathing comfortable supine on room air, nsr HEENT: Normocephalic pupils equal , dentition adequate Neck: Supple without JVD, adenopathy, or bruit Chest: Clear to auscultation, symmetrical breath sounds, no rhonchi, no tenderness             or deformity Cardiovascular: Regular rate and rhythm, no murmur, no gallop, peripheral pulses            not palpable in all extremities Abdomen:  Soft, nontender, no palpable mass or organomegaly Extremities: Warm, well-perfused, no clubbing cyanosis edema or tenderness,multiple surgical scars on both upper extremities from HD access procedures.  no venous stasis changes of the legs Rectal/GU: Deferred Neuro: Grossly non--focal and symmetrical throughout Skin: Clean and dry without rash or ulceration.  Low skin turgor    Diagnostic Studies & Laboratory data:     Recent Radiology Findings:   Ct Head Wo Contrast  Result Date: 10/27/2019 CLINICAL DATA:  68 year old female with altered mental status. Acute right basal ganglia infarcts on recent MRI. EXAM: CT HEAD WITHOUT CONTRAST TECHNIQUE: Contiguous axial images were obtained from the base of the skull through the vertex without intravenous contrast. COMPARISON:  [facility] CT and brain MRI 10/20/2019. FINDINGS: Brain: Expected mild evolution of hypodensity in the right corona radiata and caudate  head since 10/20/2019. No associated hemorrhage or mass effect. New hypodensity in the anterior left corona radiata or centrum semiovale mere the left frontal horn on series 3, image 23. No regional mass effect. Elsewhere stable gray-white matter differentiation. No acute cortically based infarct. No ventriculomegaly. Normal basilar cisterns. Vascular: Calcified atherosclerosis at the skull base. No suspicious intracranial vascular hyperdensity. Skull: No acute osseous abnormality identified. Sinuses/Orbits: Stable periosteal thickening. Visualized paranasal sinuses and mastoids are stable and well pneumatized. Other: No acute orbit or scalp soft tissue finding. IMPRESSION: 1. Evidence of a new small left frontal lobe white matter infarct since the MRI on 10/20/2019, near the left frontal horn. No associated hemorrhage or mass effect. 2. Expected appearance of the recent right basal ganglia and right corona radiata infarcts. 3. These results were communicated to Dr. Lorraine Lax at 1:21 am on 12/4/2020by text page via the [facility] messaging system. Electronically Signed   By: Genevie Ann M.D.   On: 10/27/2019 01:22   Ir Fluoro Guide Cv Line Left  Result Date: 10/26/2019 INDICATION: End-stage renal disease. In need of durable intravenous access for continuation of dialysis. Previous dialysis catheter was removed on 10/23/2023 secondary to concern for infection or versus infectious symptoms have subsequently resolved. EXAM: TUNNELED CENTRAL VENOUS HEMODIALYSIS CATHETER PLACEMENT WITH ULTRASOUND AND FLUOROSCOPIC GUIDANCE MEDICATIONS: Ancef 2 gm IV . The antibiotic was given in an appropriate time interval prior to skin puncture. ANESTHESIA/SEDATION: Versed 1 mg IV; Fentanyl 50 mcg IV; Moderate Sedation Time:  14 minutes The patient was continuously monitored during the procedure by the interventional radiology nurse under my direct supervision. FLUOROSCOPY TIME:  24 seconds (3 mGy) COMPLICATIONS: None immediate. PROCEDURE:  Informed written consent was obtained from the patient after a discussion of the risks, benefits, and alternatives to treatment. Questions regarding the procedure were encouraged and answered. The right neck and chest were prepped with chlorhexidine in a sterile fashion, and a sterile drape was applied covering the operative field. Maximum barrier sterile technique with sterile gowns and gloves were used for the procedure. A timeout was performed prior to the initiation of the procedure. Ultrasound evaluation the right neck demonstrates nonocclusive thrombus within the right internal jugular vein however note was made of a prominent right external jugular vein as such the decision was made to place a right external jugular approach hemodialysis catheter. After creating a small venotomy incision, a micropuncture kit was utilized to access the external jugular vein. Real-time ultrasound guidance was utilized for vascular access including the acquisition of a permanent ultrasound image documenting patency of the accessed vessel. The microwire was utilized to measure appropriate catheter length. A stiff Glidewire was advanced to the level of the IVC and the micropuncture sheath was exchanged for a peel-away sheath. A palindrome tunneled hemodialysis catheter measuring 19 cm from tip to  cuff was tunneled in a retrograde fashion from the anterior chest wall to the venotomy incision. The catheter was then placed through the peel-away sheath with tips ultimately positioned within the superior aspect of the right atrium. Final catheter positioning was confirmed and documented with a spot radiographic image. The catheter aspirates and flushes normally. The catheter was flushed with appropriate volume heparin dwells. The catheter exit site was secured with a 0-Prolene retention suture. The venotomy incision was closed with Dermabond and Steri-strips. Dressings were applied. The patient tolerated the procedure well without  immediate post procedural complication. IMPRESSION: Successful placement of 19 cm tip to cuff tunneled hemodialysis catheter via the right external jugular vein with tips terminating within the superior aspect of the right atrium. The catheter is ready for immediate use. Electronically Signed   By: Sandi Mariscal M.D.   On: 10/26/2019 17:18   Ir US Guide Vasc Access Left  Result Date: 10/27/2019 INDICATION: End-stage renal disease. In need of durable intravenous access for continuation of dialysis. Previous dialysis catheter was removed on 10/23/2023 secondary to concern for infection or versus infectious symptoms have subsequently resolved. EXAM: TUNNELED CENTRAL VENOUS HEMODIALYSIS CATHETER PLACEMENT WITH ULTRASOUND AND FLUOROSCOPIC GUIDANCE MEDICATIONS: Ancef 2 gm IV . The antibiotic was given in an appropriate time interval prior to skin puncture. ANESTHESIA/SEDATION: Versed 1 mg IV; Fentanyl 50 mcg IV; Moderate Sedation Time:  14 minutes The patient was continuously monitored during the procedure by the interventional radiology nurse under my direct supervision. FLUOROSCOPY TIME:  24 seconds (3 mGy) COMPLICATIONS: None immediate. PROCEDURE: Informed written consent was obtained from the patient after a discussion of the risks, benefits, and alternatives to treatment. Questions regarding the procedure were encouraged and answered. The right neck and chest were prepped with chlorhexidine in a sterile fashion, and a sterile drape was applied covering the operative field. Maximum barrier sterile technique with sterile gowns and gloves were used for the procedure. A timeout was performed prior to the initiation of the procedure. Ultrasound evaluation the right neck demonstrates nonocclusive thrombus within the right internal jugular vein however note was made of a prominent right external jugular vein as such the decision was made to place a right external jugular approach hemodialysis catheter. After creating a  small venotomy incision, a micropuncture kit was utilized to access the external jugular vein. Real-time ultrasound guidance was utilized for vascular access including the acquisition of a permanent ultrasound image documenting patency of the accessed vessel. The microwire was utilized to measure appropriate catheter length. A stiff Glidewire was advanced to the level of the IVC and the micropuncture sheath was exchanged for a peel-away sheath. A palindrome tunneled hemodialysis catheter measuring 19 cm from tip to cuff was tunneled in a retrograde fashion from the anterior chest wall to the venotomy incision. The catheter was then placed through the peel-away sheath with tips ultimately positioned within the superior aspect of the right atrium. Final catheter positioning was confirmed and documented with a spot radiographic image. The catheter aspirates and flushes normally. The catheter was flushed with appropriate volume heparin dwells. The catheter exit site was secured with a 0-Prolene retention suture. The venotomy incision was closed with Dermabond and Steri-strips. Dressings were applied. The patient tolerated the procedure well without immediate post procedural complication. IMPRESSION: Successful placement of 19 cm tip to cuff tunneled hemodialysis catheter via the right external jugular vein with tips terminating within the superior aspect of the right atrium. The catheter is ready for immediate use. Electronically Signed  By: Sandi Mariscal M.D.   On: 10/26/2019 17:18     I have independently reviewed the above radiologic studies and discussed with the patient   Recent Lab Findings: Lab Results  Component Value Date   WBC 11.6 (H) 10/27/2019   HGB 8.2 (L) 10/27/2019   HCT 25.6 (L) 10/27/2019   PLT 200 10/27/2019   GLUCOSE 128 (H) 10/27/2019   CHOL 150 10/21/2019   TRIG 192 (H) 10/21/2019   HDL 47 10/21/2019   LDLCALC 65 10/21/2019   ALT 16 10/20/2019   AST 12 (L) 10/20/2019   NA 134 (L)  10/27/2019   K 3.6 10/27/2019   CL 95 (L) 10/27/2019   CREATININE 4.76 (H) 10/27/2019   BUN 20 10/27/2019   CO2 24 10/27/2019   TSH 1.028 07/19/2019   INR 1.0 10/20/2019   HGBA1C 7.6 (H) 10/21/2019      Assessment / Plan:      Pseudomonas bacteremia with mitral valve endocarditis, mild-moderate MR with normal LV function  History of recent cortical infarct with right sided weakness and dysarthria  Infected hemodialysis catheter removed and replaced with a clean transvenous tunneled catheter 12-3  Possible  dental- peridental disease  The patient currently does not meet criteria for urgent mitral valve replacement for endocarditis.  She has no heart failure or hemodynamic instability from the mitral valve disease.  She has had significant difficulty with recurrent bacteremia over the past 3 months and her blood cultures have only been negative for 5 days.  A new HD catheter was recently placed.   The patient is at risk for recurrent infection of a new bioprosthetic valve under the current conditions and the patient should be treated aggressively with IV antibiotics and have negative blood cultures for at least  3 weeks before considering surgery.  Additionally,  the patient's current general condition is not adequate for sternotomy cardiopulmonary bypass and mitral valve  repair-replacement.  She has lost weight, she is nonambulatory, and she is malnourished.  Best course at this time is antibiotic therapy of the mitral endocarditis.  She would not benefit from mitral valve repair-replacement currently.  I have ordered a Panorex study of her remaining teeth and if that is abnormal I would recommend dental evaluation as well.  I have discussed these recommendations with the patient and her family and they understand and agree.  _0 @ 10/27/2019 11:53 AM

## 2019-10-27 NOTE — Progress Notes (Signed)
Pt returned from HD @approx . 2326. At that time, pt oriented to person, place, and situation but not time. As pt settled in, VS obtained, and assessment completed, pt found to be markedly different from report given by Day RN (this RN did not see pt during shift change due to pt being in HD.) @0027  RR RN paged to obtain second opinion on pt's cognitive changes. RR RN promptly arrived at bedside, and agreed with new NIH of 12 (previously 6). Neuro paged, CT ordered, and subsequently Attending on-call notified. Pt made NPO and additional orders received as indicated by Order Review. Will continue to monitor. See RR RN note for additional details.

## 2019-10-27 NOTE — Progress Notes (Signed)
[facility] for Infectious Disease   Reason for visit: Follow up on recurrent Pseudomonal BSI/MVIE  Antibiotics: Cefepime, day 5  Interval History: Pt completed HD last PM. Assessed by CTS this AM and felt to be poor operative candidate for MVR. Tagged WBC scan performed today showed no focal areas of suspected occult infection. Nursing reports pt somewhat altered last PM after HD, so CT head repeated that confirmed new small LT frontal lobe WM infarct. Nursing states MS has gradually improved throughout the day today. Her son is present at bedside today. Fever curve, WBC & Cr trends, imaging, cx results, and ABX usage all independently reviewed    Current Facility-Administered Medications:    0.9 %  sodium chloride infusion, , Intravenous, Continuous, Crenshaw, Denice Bors, MD, Stopped at 10/20/19 1317   acetaminophen (TYLENOL) tablet 650 mg, 650 mg, Oral, Q4H PRN, 650 mg at 10/25/19 1959 **OR** acetaminophen (TYLENOL) 160 MG/5ML solution 650 mg, 650 mg, Per Tube, Q4H PRN **OR** acetaminophen (TYLENOL) suppository 650 mg, 650 mg, Rectal, Q4H PRN, Stanford Breed, Denice Bors, MD   acetaminophen (TYLENOL) tablet 1,000 mg, 1,000 mg, Oral, Once, Crenshaw, Denice Bors, MD   calcium carbonate (TUMS - dosed in mg elemental calcium) chewable tablet 200 mg of elemental calcium, 1 tablet, Oral, 2 times per day, Lelon Perla, MD, 200 mg of elemental calcium at 10/25/19 1744   carvedilol (COREG) tablet 6.25 mg, 6.25 mg, Oral, BID, Lelon Perla, MD, 6.25 mg at 10/27/19 1048   ceFEPIme (MAXIPIME) 1 g in sodium chloride 0.9 % 100 mL IVPB, 1 g, Intravenous, Q24H, Crenshaw, Denice Bors, MD, Last Rate: 200 mL/hr at 10/27/19 0043, 1 g at 10/27/19 0043   Chlorhexidine Gluconate Cloth 2 % PADS 6 each, 6 each, Topical, Q0600, Lelon Perla, MD, 6 each at 10/27/19 [id number]   ciprofloxacin (CIPRO) tablet 500 mg, 500 mg, Oral, q1800, [place] Callas, NP, 500 mg at 10/26/19 1724   doxercalciferol (HECTOROL)  injection 1.5 mcg, 1.5 mcg, Intravenous, Q M,W,F-HD, Lelon Perla, MD, 1.5 mcg at 10/23/19 1142   feeding supplement (ENSURE ENLIVE) (ENSURE ENLIVE) liquid 237 mL, 237 mL, Oral, BID BM, Swayze, Ava, DO, 237 mL at 10/27/19 1048   fluticasone (FLONASE) 50 MCG/ACT nasal spray 1 spray, 1 spray, Each Nare, Daily PRN, Lelon Perla, MD   gabapentin (NEURONTIN) capsule 100 mg, 100 mg, Oral, QHS, Crenshaw, Brian S, MD, 100 mg at 10/25/19 2207   heparin ADULT infusion 100 units/mL (25000 units/241mL sodium chloride 0.45%), 1,150 Units/hr, Intravenous, Continuous, Lelon Perla, MD, Last Rate: 11.5 mL/hr at 10/26/19 2326, 1,150 Units/hr at 10/26/19 2326   hydrALAZINE (APRESOLINE) injection 10 mg, 10 mg, Intravenous, Q6H PRN, Stanford Breed, Denice Bors, MD   insulin aspart (novoLOG) injection 0-5 Units, 0-5 Units, Subcutaneous, QHS, Lelon Perla, MD, 4 Units at 10/25/19 2207   insulin aspart (novoLOG) injection 0-9 Units, 0-9 Units, Subcutaneous, TID WC, Lelon Perla, MD, 2 Units at 10/27/19 1232   isosorbide mononitrate (IMDUR) 24 hr tablet 30 mg, 30 mg, Oral, QPC lunch, Lelon Perla, MD, 30 mg at 10/27/19 1233   multivitamin (RENA-VIT) tablet 1 tablet, 1 tablet, Oral, QPC lunch, Lelon Perla, MD, 1 tablet at 10/25/19 2207   ondansetron [facility]) injection 4 mg, 4 mg, Intravenous, Q6H PRN, Lelon Perla, MD, 4 mg at 10/24/19 [id number]   oxyCODONE (Oxy IR/ROXICODONE) immediate release tablet 5 mg, 5 mg, Oral, Q6H PRN, Lelon Perla, MD, 5 mg at 10/25/19 [phone]  senna-docusate (Senokot-S) tablet 1 tablet, 1 tablet, Oral, QHS PRN, Stanford Breed, Denice Bors, MD  Facility-Administered Medications Ordered in Other Encounters:    0.9 %  sodium chloride infusion, , Intravenous, Continuous, Monia Sabal, [place]   Physical Exam:   Vitals:   10/27/19 0744 10/27/19 1158  BP: (!) 163/86 (!) 142/96  Pulse: 100 99  Resp: 18 17  Temp:  99.5 F (37.5 C)  SpO2: 100% 97%   Physical  Exam Lines: RT [place] permacath, PIV x 2 Gen: pleasant, chronically ill, worsened dysarthria today, A&Ox 3 Head: NCAT, no temporal wasting evident EENT: PERRL, EOMI, MMM, adequate dentition Neck: supple, no JVD CV: tachycardic RR, I/VI SEM at LLSB Pulm: CTA bilaterally, no wheeze or retractions Abd: soft, obese, NTND, +BS Extrems: 1+ non-pitting LE edema, 1+ pulses Skin: no rashes, adequate skin turgor Neuro: CN II-XII grossly intact except speech slightly slurred today, no focal neurologic deficits appreciated but gait was not assessed, A&Ox 3   Review of Systems:  Review of Systems  Constitutional: Positive for malaise/fatigue. Negative for chills, fever and weight loss.  HENT: Negative for congestion, hearing loss, sinus pain and sore throat.   Eyes: Negative for blurred vision, photophobia and discharge.  Respiratory: Negative for cough, hemoptysis and shortness of breath.   Cardiovascular: Negative for chest pain, palpitations, orthopnea and leg swelling.  Gastrointestinal: Negative for abdominal pain, constipation, diarrhea, heartburn, nausea and vomiting.  Genitourinary: Negative for dysuria, flank pain, frequency and urgency.  Musculoskeletal: Positive for back pain. Negative for joint pain and myalgias.  Skin: Negative for itching and rash.  Neurological: Positive for weakness. Negative for tremors, seizures and headaches.  Endo/Heme/Allergies: Negative for polydipsia. Does not bruise/bleed easily.  Psychiatric/Behavioral: Positive for depression. Negative for substance abuse. The patient is not nervous/anxious and does not have insomnia.      Lab Results  Component Value Date   WBC 11.6 (H) 10/27/2019   HGB 8.2 (L) 10/27/2019   HCT 25.6 (L) 10/27/2019   MCV 88.6 10/27/2019   PLT 200 10/27/2019    Lab Results  Component Value Date   CREATININE 4.76 (H) 10/27/2019   BUN 20 10/27/2019   NA 134 (L) 10/27/2019   K 3.6 10/27/2019   CL 95 (L) 10/27/2019   CO2 24 10/27/2019     Lab Results  Component Value Date   ALT 16 10/20/2019   AST 12 (L) 10/20/2019   ALKPHOS 75 10/20/2019     Microbiology: Recent Results (from the past 240 hour(s))  SARS CORONAVIRUS 2 (TAT 6-24 HRS) Nasopharyngeal Nasopharyngeal Swab     Status: None   Collection Time: 10/20/19  7:30 AM   Specimen: Nasopharyngeal Swab  Result Value Ref Range Status   SARS Coronavirus 2 NEGATIVE NEGATIVE Final    Comment: (NOTE) SARS-CoV-2 target nucleic acids are NOT DETECTED. The SARS-CoV-2 RNA is generally detectable in upper and lower respiratory specimens during the acute phase of infection. Negative results do not preclude SARS-CoV-2 infection, do not rule out co-infections with other pathogens, and should not be used as the sole basis for treatment or other patient management decisions. Negative results must be combined with clinical observations, patient history, and epidemiological information. The expected result is Negative. Fact Sheet for Patients: SugarRoll.be Fact Sheet for Healthcare Providers: https://www.woods-mathews.com/ This test is not yet approved or cleared by the [place] FDA and  has been authorized for detection and/or diagnosis of SARS-CoV-2 by FDA under an Emergency Use Authorization (EUA). This EUA will remain  in  effect (meaning this test can be used) for the duration of the COVID-19 declaration under Section 56 4(b)(1) of the Act, 21 U.S.C. section 360bbb-3(b)(1), unless the authorization is terminated or revoked sooner. Performed at [facility], [place] [address], [place], [place] [address]   Culture, blood (routine x 2)     Status: Abnormal   Collection Time: 10/22/19  7:35 AM   Specimen: BLOOD  Result Value Ref Range Status   Specimen Description BLOOD RIGHT ANTECUBITAL  Final   Special Requests   Final    BOTTLES DRAWN AEROBIC ONLY Blood Culture adequate volume   Culture  Setup Time   Final    GRAM  NEGATIVE RODS AEROBIC BOTTLE ONLY CRITICAL VALUE NOTED.  VALUE IS CONSISTENT WITH PREVIOUSLY REPORTED AND CALLED VALUE.    Culture (A)  Final    PSEUDOMONAS AERUGINOSA SUSCEPTIBILITIES PERFORMED ON PREVIOUS CULTURE WITHIN THE LAST 5 DAYS. Performed at [facility], [place] [address] [place], [place] [address]    Report Status 10/25/2019 FINAL  Final  Culture, blood (routine x 2)     Status: Abnormal   Collection Time: 10/22/19  7:43 AM   Specimen: BLOOD LEFT HAND  Result Value Ref Range Status   Specimen Description BLOOD LEFT HAND  Final   Special Requests   Final    BOTTLES DRAWN AEROBIC ONLY Blood Culture adequate volume   Culture  Setup Time   Final    GRAM NEGATIVE RODS AEROBIC BOTTLE ONLY CRITICAL RESULT CALLED TO, READ BACK BY AND VERIFIED WITH: PHARMD V BRYK [id number] AT 725 AM BY CM Performed at [facility], [place] [address] [place],  [address]    Culture PSEUDOMONAS AERUGINOSA (A)  Final   Report Status 10/25/2019 FINAL  Final   Organism ID, Bacteria PSEUDOMONAS AERUGINOSA  Final      Susceptibility   Pseudomonas aeruginosa - MIC*    CEFTAZIDIME 2 SENSITIVE Sensitive     CIPROFLOXACIN <=0.25 SENSITIVE Sensitive     GENTAMICIN <=1 SENSITIVE Sensitive     IMIPENEM 1 SENSITIVE Sensitive     PIP/TAZO <=4 SENSITIVE Sensitive     CEFEPIME <=1 SENSITIVE Sensitive     * PSEUDOMONAS AERUGINOSA  Blood Culture ID Panel (Reflexed)     Status: Abnormal   Collection Time: 10/22/19  7:43 AM  Result Value Ref Range Status   Enterococcus species NOT DETECTED NOT DETECTED Final   Listeria monocytogenes NOT DETECTED NOT DETECTED Final   Staphylococcus species NOT DETECTED NOT DETECTED Final   Staphylococcus aureus (BCID) NOT DETECTED NOT DETECTED Final   Streptococcus species NOT DETECTED NOT DETECTED Final   Streptococcus agalactiae NOT DETECTED NOT DETECTED Final   Streptococcus pneumoniae NOT DETECTED NOT DETECTED Final   Streptococcus pyogenes NOT DETECTED NOT  DETECTED Final   Acinetobacter baumannii NOT DETECTED NOT DETECTED Final   Enterobacteriaceae species NOT DETECTED NOT DETECTED Final   Enterobacter cloacae complex NOT DETECTED NOT DETECTED Final   Escherichia coli NOT DETECTED NOT DETECTED Final   Klebsiella oxytoca NOT DETECTED NOT DETECTED Final   Klebsiella pneumoniae NOT DETECTED NOT DETECTED Final   Proteus species NOT DETECTED NOT DETECTED Final   Serratia marcescens NOT DETECTED NOT DETECTED Final   Carbapenem resistance NOT DETECTED NOT DETECTED Final   Haemophilus influenzae NOT DETECTED NOT DETECTED Final   Neisseria meningitidis NOT DETECTED NOT DETECTED Final   Pseudomonas aeruginosa DETECTED (A) NOT DETECTED Final    Comment: CRITICAL RESULT CALLED TO, READ BACK  BY AND VERIFIED WITH: PHARMD V BRYK [id number] AT 725 AM BY CM    Candida albicans NOT DETECTED NOT DETECTED Final   Candida glabrata NOT DETECTED NOT DETECTED Final   Candida krusei NOT DETECTED NOT DETECTED Final   Candida parapsilosis NOT DETECTED NOT DETECTED Final   Candida tropicalis NOT DETECTED NOT DETECTED Final    Comment: Performed at [facility], [place] [address], [place], [place] [address]  Culture, blood (routine x 2)     Status: Abnormal   Collection Time: 10/22/19  5:03 PM   Specimen: BLOOD LEFT HAND  Result Value Ref Range Status   Specimen Description BLOOD LEFT HAND  Final   Special Requests   Final    BOTTLES DRAWN AEROBIC AND ANAEROBIC Blood Culture adequate volume   Culture  Setup Time   Final    AEROBIC BOTTLE ONLY GRAM NEGATIVE RODS CRITICAL VALUE NOTED.  VALUE IS CONSISTENT WITH PREVIOUSLY REPORTED AND CALLED VALUE.    Culture (A)  Final    PSEUDOMONAS AERUGINOSA SUSCEPTIBILITIES PERFORMED ON PREVIOUS CULTURE WITHIN THE LAST 5 DAYS. Performed at [facility], [place] [address], [place], [place] [address]    Report Status 10/25/2019 FINAL  Final  Culture, blood (routine x 2)     Status: Abnormal   Collection Time: 10/22/19   5:08 PM   Specimen: BLOOD LEFT ARM  Result Value Ref Range Status   Specimen Description BLOOD LEFT ARM  Final   Special Requests   Final    BOTTLES DRAWN AEROBIC ONLY Blood Culture results may not be optimal due to an inadequate volume of blood received in culture bottles   Culture  Setup Time   Final    AEROBIC BOTTLE ONLY GRAM NEGATIVE RODS CRITICAL VALUE NOTED.  VALUE IS CONSISTENT WITH PREVIOUSLY REPORTED AND CALLED VALUE.    Culture (A)  Final    PSEUDOMONAS AERUGINOSA SUSCEPTIBILITIES PERFORMED ON PREVIOUS CULTURE WITHIN THE LAST 5 DAYS. Performed at [facility], [place] [address] [place], [place] [address]    Report Status 10/25/2019 FINAL  Final  Culture, blood (routine x 2)     Status: None (Preliminary result)   Collection Time: 10/24/19  1:18 PM   Specimen: BLOOD LEFT HAND  Result Value Ref Range Status   Specimen Description BLOOD LEFT HAND  Final   Special Requests   Final    BOTTLES DRAWN AEROBIC ONLY Blood Culture adequate volume   Culture   Final    NO GROWTH 3 DAYS Performed at [facility], [place] [address] [place], [place] [address]    Report Status PENDING  Incomplete  Culture, blood (routine x 2)     Status: None (Preliminary result)   Collection Time: 10/24/19  1:28 PM   Specimen: BLOOD RIGHT HAND  Result Value Ref Range Status   Specimen Description BLOOD RIGHT HAND  Final   Special Requests   Final    BOTTLES DRAWN AEROBIC AND ANAEROBIC Blood Culture results may not be optimal due to an excessive volume of blood received in culture bottles   Culture   Final    NO GROWTH 3 DAYS Performed at [facility], [place] [address] [place], [place] [address]    Report Status PENDING  Incomplete    Impression/Plan: Patient is a 68 year old African-American female diabetic with end-stage renal disease on hemodialysis with history of relapsing pseudomonal infection that began with a vascular graft in June 2020, status post debridement  and ligation  admitted with new onset left-sided weakness found to have basal ganglia CVA and 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  1.  Mitral valve endocarditis -the patient's recurrent nature of her pseudomonal infection began with an upper right arm graft infection in June 2020 at which time she had documented Pseudomonas from her vascular wound.  Unfortunately, she only received Bactrim following this procedure and several weeks later was readmitted in late August with a confirmed pseudomonal bacteremia.  Per report, she received 6 weeks of IV Fortaz for treatment presumably dosed only at dialysis.  Several weeks after completion of this treatment she was admitted again for another episode of pseudomonal bacteremia and with basal ganglia CVA worrisome for embolic event.  Yesterday, her TEE confirmed a mitral valve vegetation/endocarditis.  Overnight, she developed confusion prompting a repeat CT scan to be performed which shows a new frontal lobe CVA concerning for further CNS emboli.  As there was a report that the patient had a subclavian DVT at some point in the past 6 months, a tagged white blood cell scan was performed, which showed no occult hidden/accessory focus of infection.  She was further evaluated by the CT surgery service for consideration of valve replacement today, but felt to be a poor surgical candidate and medical management was recommended.  Significant complexities for dosing of the patient's antibiotics for symptomatic's for pseudomonal infection exist, so we will plan to treat the patient with daily oral Cipro 500 mg along with cefepime 1 g IV daily for now with intention to dose at dialysis as noted below upon discharge.  I would anticipate a further 6-week course of combined antibiotic treatment in total.  2. ESRD -the patient normally dialyzes every Monday, Wednesday, and Friday.  Earlier this week, she had removal of her prior tunneled hemodialysis access with a line  free period while her blood cxs cleared.  A new permacath was placed to her right subclavian vein yesterday.  We will plan to continue cefepime dosed 1 g IV daily/nightly to ensure consistent dosing while she remains in inpatient along with oral Cipro 500 mg daily as well.  Once she has been transitioned back to her anticipated outpatient dialysis schedule, she will continue on daily oral Cipro but have her cefepime likely transition to a dosing regimen of 2 g IV q. Monday and Wednesdays with a 3 g dose every Friday.  3. CVA/CNS septic emboli -the patient's MRI on presentation showed new acute right basal ganglia infarcts concerning for embolic events.  Repeat imaging overnight showed a new frontal lobe infarct and her TEE yesterday showed mitral valve vegetation, confirming endocarditis.  Antibiotics have been adjusted as noted above.  She was assessed by the CT surgery service in light of her new CVA who felt that the patient was a poor surgical candidate and recommended medical monotherapy instead.  Anticipate a skilled nursing facility/rehab stay at the time of hospital discharge given her transient neurological issues.  Dr. Baxter Flattery is covering our infectious disease service over this weekend should any assistance be needed.  1.

## 2019-10-27 NOTE — Progress Notes (Signed)
OT Cancellation Note  Patient Details Name: [name] MRN: [id number] DOB: 20-Apr-1951   Cancelled Treatment:    Reason Eval/Treat Not Completed: Patient at procedure or test/ unavailable  Pt off unit/at nuclear medicine.   [name] 10/27/2019, 3:37 PM

## 2019-10-27 NOTE — Progress Notes (Signed)
ANTICOAGULATION CONSULT NOTE - Follow-Up  Pharmacy Consult for Heparin Indication: RIJ DVT  No Known Allergies  Patient Measurements: Height: 5\' 7"  (170.2 cm) Weight: 140 lb 14 oz (63.9 kg) IBW/kg (Calculated) : 61.6 Heparin Dosing Weight: 63 kg  Vital Signs: Temp: 99 F (37.2 C) (12/03 2326) Temp Source: Oral (12/03 2326) BP: 152/93 (12/04 0325) Pulse Rate: 102 (12/04 0325)  Labs: Recent Labs    10/25/19 0314 10/25/19 1048 10/25/19 1340 10/25/19 1540 10/26/19 0353 10/27/19 0255  HGB 7.4*  --   --   --  8.2* 8.2*  HCT 23.4*  --   --   --  24.5* 25.6*  PLT 200  --   --   --  216 200  HEPARINUNFRC 0.43 0.42  --   --  0.35 0.33  CREATININE 9.14*  --   --   --  10.57* 4.76*  TROPONINIHS  --   --  84* 73*  --   --     Estimated Creatinine Clearance: 10.8 mL/min (A) (by C-G formula based on SCr of 4.76 mg/dL (H)).  Assessment: 52 YOF with ESRD who presented on 11/27 with new CVA and is now found to have RIJ DVT and pharmacy consulted to start Heparin anticoagulation - low goal and no bolus with concurrent CVA.   Heparin level remains therapeutic, H/H stable, plts wnl, now s/p tunneled HD cath placement.    Goal of Therapy:  Heparin level 0.3-0.5 units/ml Monitor platelets by anticoagulation protocol: Yes   Plan:  Continue heparin gtt at 1150 units/hr Daily heparin level, CBC, s/s bleeding F/u plan to switch back to PO [facility]  Bertis Ruddy, PharmD Clinical Pharmacist Please check AMION for all [place] numbers 10/27/2019 7:40 AM

## 2019-10-27 NOTE — Progress Notes (Signed)
Updated pt's son, [name], on plan of care and pt's current condition. All questions answered and son endorsed he would be here at the beginning of visiting hours in AM.

## 2019-10-28 DIAGNOSIS — I330 Acute and subacute infective endocarditis: Secondary | ICD-10-CM

## 2019-10-28 LAB — RENAL FUNCTION PANEL
Albumin: 1.7 g/dL — ABNORMAL LOW (ref 3.5–5.0)
Anion gap: 13 (ref 5–15)
BUN: 38 mg/dL — ABNORMAL HIGH (ref 8–23)
CO2: 25 mmol/L (ref 22–32)
Calcium: 8.4 mg/dL — ABNORMAL LOW (ref 8.9–10.3)
Chloride: 98 mmol/L (ref 98–111)
Creatinine, Ser: 6.7 mg/dL — ABNORMAL HIGH (ref 0.44–1.00)
GFR calc Af Amer: 7 mL/min — ABNORMAL LOW (ref >60)
GFR calc non Af Amer: 6 mL/min — ABNORMAL LOW (ref >60)
Glucose, Bld: 146 mg/dL — ABNORMAL HIGH (ref 70–99)
Phosphorus: 5 mg/dL — ABNORMAL HIGH (ref 2.5–4.6)
Potassium: 4.1 mmol/L (ref 3.5–5.1)
Sodium: 136 mmol/L (ref 135–145)

## 2019-10-28 LAB — GLUCOSE, CAPILLARY
Glucose-Capillary: 124 mg/dL — ABNORMAL HIGH (ref 70–99)
Glucose-Capillary: 119 mg/dL — ABNORMAL HIGH (ref 70–99)
Glucose-Capillary: 137 mg/dL — ABNORMAL HIGH (ref 70–99)

## 2019-10-28 LAB — HEPARIN LEVEL (UNFRACTIONATED)
Heparin Unfractionated: <0.1 IU/mL — ABNORMAL LOW (ref 0.30–0.70)
Heparin Unfractionated: 0.32 IU/mL (ref 0.30–0.70)

## 2019-10-28 LAB — CBC
HCT: 21.9 % — ABNORMAL LOW (ref 36.0–46.0)
Hemoglobin: 7 g/dL — ABNORMAL LOW (ref 12.0–15.0)
MCH: 28.6 pg (ref 26.0–34.0)
MCHC: 32 g/dL (ref 30.0–36.0)
MCV: 89.4 fL (ref 80.0–100.0)
Platelets: 201 10*3/uL (ref 150–400)
RBC: 2.45 MIL/uL — ABNORMAL LOW (ref 3.87–5.11)
RDW: 16.3 % — ABNORMAL HIGH (ref 11.5–15.5)
WBC: 12.5 10*3/uL — ABNORMAL HIGH (ref 4.0–10.5)
nRBC: 0 % (ref 0.0–0.2)

## 2019-10-28 LAB — PROTIME-INR
INR: 1.2 (ref 0.8–1.2)
Prothrombin Time: 15 seconds (ref 11.4–15.2)

## 2019-10-28 MED ORDER — WARFARIN SODIUM 5 MG PO TABS
5.0000 mg | ORAL_TABLET | Freq: Once | ORAL | Status: AC
  Administered 2019-10-28: 5 mg via ORAL
  Filled 2019-10-28: qty 1

## 2019-10-28 MED ORDER — HEPARIN SODIUM (PORCINE) 1000 UNIT/ML IJ SOLN
INTRAMUSCULAR | Status: AC
  Administered 2019-10-28: 3200 [IU]
  Filled 2019-10-28: qty 4

## 2019-10-28 NOTE — Procedures (Signed)
Patient seen on Hemodialysis. BP 139/80 (BP Location: Left Arm)   Pulse 88   Temp 99.5 F (37.5 C) (Oral)   Resp 13   Ht 5\' 7"  (1.702 m)   Wt 63.9 kg   SpO2 99%   BMI 22.06 kg/m   QB 400, UF goal 2L Tolerating treatment without complaints at this time.   Elmarie Shiley MD [facility]. Office # [phone] Pager # [phone] 10:45 AM

## 2019-10-28 NOTE — Progress Notes (Signed)
ANTICOAGULATION CONSULT NOTE - Follow-Up  Pharmacy Consult for Transitioning From Heparin to Warfarin Indication: RIJ DVT  No Known Allergies  Patient Measurements: Height: 5\' 7"  (170.2 cm) Weight: 140 lb 14 oz (63.9 kg) IBW/kg (Calculated) : 61.6 Heparin Dosing Weight: 63 kg  Vital Signs: Temp: 99.5 F (37.5 C) (12/05 0836) Temp Source: Oral (12/05 0836) BP: 139/80 (12/05 0836) Pulse Rate: 88 (12/05 0836)  Labs: Recent Labs    10/25/19 1340 10/25/19 1540  10/26/19 0353 10/27/19 0255 10/28/19 0314 10/28/19 1211  HGB  --   --    < > 8.2* 8.2* 7.0*  --   HCT  --   --   --  24.5* 25.6* 21.9*  --   PLT  --   --   --  216 200 201  --   LABPROT  --   --   --   --   --  15.0  --   INR  --   --   --   --   --  1.2  --   HEPARINUNFRC  --   --    < > 0.35 0.33 <0.10* 0.32  CREATININE  --   --   --  10.57* 4.76* 6.70*  --   TROPONINIHS 84* 73*  --   --   --   --   --    < > = values in this interval not displayed.    Estimated Creatinine Clearance: 7.7 mL/min (A) (by C-G formula based on SCr of 6.7 mg/dL (H)).  Assessment: 68 yr old female with ESRD who presented on 11/27 with new CVA and is now found to have RIJ DVT and pharmacy consulted to start heparin anticoagulation - low goal and no bolus with concurrent CVA. Pt rec'd new tunneled HD catheter on 12/3 for continuation of HD.  Heparin level therapeutic  Warfarin started 12/4  INR now 1.2  Goal of Therapy:  Heparin level 0.3-0.5 units/ml  INR 2.0-3.0 Monitor platelets by anticoagulation protocol: Yes   Plan:  Continue heparin infusion at 1150 units/hr Warfarin 5 mg PO X 1 tonight Discontinue heparin infusion when INR therapeutic Daily INR HL CBC  Barth Kirks, PharmD, BCPS, BCCCP Clinical Pharmacist [phone]  Please check AMION for all [place] numbers  10/28/2019 12:56 PM

## 2019-10-28 NOTE — NC FL2 (Signed)
[place] LEVEL OF CARE SCREENING TOOL     IDENTIFICATION  Patient Name: [name] Birthdate: 1951-07-02 Sex: female Admission Date (Current Location): 10/20/2019  [facility] and [place] Number:  Herbalist and Address:  [facility]. [facility], [place] [address] [place], [place] [address]      Provider Number: [id number]  Attending Physician Name and Address:  Karie Kirks, DO  Relative Name and Phone Number:  [name] Clark, Son, [phone]    Current Level of Care: Hospital Recommended Level of Care: [place] Prior Approval Number:    Date Approved/Denied:   PASRR Number:    Discharge Plan: SNF    Current Diagnoses: Patient Active Problem List   Diagnosis Date Noted  . Acute bacterial endocarditis   . Acute left-sided weakness   . Acute ischemic stroke ([place]) 10/20/2019  . Hyperkalemia 09/10/2019  . End-stage renal disease on hemodialysis ([place])   . Nausea vomiting and diarrhea   . Bacteremia due to Pseudomonas 07/20/2019  . ESRD needing dialysis ([place]) 05/15/2019  . Anemia of chronic renal failure 04/03/2018  . Essential hypertension 04/03/2018  . Type 2 diabetes mellitus with diabetic nephropathy ([place]) 04/03/2018    Orientation RESPIRATION BLADDER Height & Weight     Self  Normal Continent Weight: 140 lb 14 oz (63.9 kg) Height:  5\' 7"  (170.2 cm)  BEHAVIORAL SYMPTOMS/MOOD NEUROLOGICAL BOWEL NUTRITION STATUS      Continent Diet(Regular diet, thin liquids)  AMBULATORY STATUS COMMUNICATION OF NEEDS Skin   Limited Assist Verbally Surgical wounds                       Personal Care Assistance Level of Assistance  Bathing, Dressing, Feeding, Total care Bathing Assistance: Maximum assistance Feeding assistance: Maximum assistance Dressing Assistance: Maximum assistance Total Care Assistance: Maximum assistance   Functional Limitations Info  Sight, Speech, Hearing Sight Info: Impaired Hearing Info:  Adequate Speech Info: Adequate    SPECIAL CARE FACTORS FREQUENCY  PT (By licensed PT), OT (By licensed OT), Speech therapy     PT Frequency: 5x/wk OT Frequency: 5x/wk     Speech Therapy Frequency: 2x/wk      Contractures Contractures Info: Not present    Additional Factors Info  Code Status, Allergies, Insulin Sliding Scale Code Status Info: DNR Allergies Info: No Known Allergies   Insulin Sliding Scale Info: insulin aspart novolog 0-5 units daily at bedtime and insulin aspart novolog 0-9 units 3x daily w/meals       Current Medications (10/28/2019):  This is the current hospital active medication list Current Facility-Administered Medications  Medication Dose Route Frequency Provider Last Rate Last Dose  . 0.9 %  sodium chloride infusion   Intravenous Continuous Lelon Perla, MD   Stopped at 10/20/19 1317  . acetaminophen (TYLENOL) tablet 650 mg  650 mg Oral Q4H PRN Lelon Perla, MD   650 mg at 10/25/19 1959   Or  . acetaminophen (TYLENOL) 160 MG/5ML solution 650 mg  650 mg Per Tube Q4H PRN Lelon Perla, MD       Or  . acetaminophen (TYLENOL) suppository 650 mg  650 mg Rectal Q4H PRN Lelon Perla, MD      . acetaminophen (TYLENOL) tablet 1,000 mg  1,000 mg Oral Once Lelon Perla, MD      . calcium carbonate (TUMS - dosed in mg elemental calcium) chewable tablet 200 mg of elemental calcium  1 tablet Oral 2  times per day Lelon Perla, MD   200 mg of elemental calcium at 10/27/19 1716  . carvedilol (COREG) tablet 6.25 mg  6.25 mg Oral BID Lelon Perla, MD   6.25 mg at 10/28/19 0936  . ceFEPIme (MAXIPIME) 1 g in sodium chloride 0.9 % 100 mL IVPB  1 g Intravenous Q24H Lelon Perla, MD   Stopped at 10/28/19 [phone]  . Chlorhexidine Gluconate Cloth 2 % PADS 6 each  6 each Topical Q0600 Lelon Perla, MD   6 each at 10/28/19 0351  . ciprofloxacin (CIPRO) tablet 500 mg  500 mg Oral q1800 Puryear Callas, NP   500 mg at 10/27/19 1716  .  doxercalciferol (HECTOROL) injection 1.5 mcg  1.5 mcg Intravenous Q M,W,F-HD Lelon Perla, MD   1.5 mcg at 10/23/19 1142  . feeding supplement (ENSURE ENLIVE) (ENSURE ENLIVE) liquid 237 mL  237 mL Oral BID BM Swayze, Ava, DO   237 mL at 10/28/19 0944  . fluticasone (FLONASE) 50 MCG/ACT nasal spray 1 spray  1 spray Each Nare Daily PRN Lelon Perla, MD      . gabapentin (NEURONTIN) capsule 100 mg  100 mg Oral QHS Lelon Perla, MD   100 mg at 10/27/19 2141  . heparin ADULT infusion 100 units/mL (25000 units/240mL sodium chloride 0.45%)  1,150 Units/hr Intravenous Continuous Lelon Perla, MD 11.5 mL/hr at 10/28/19 0343 1,150 Units/hr at 10/28/19 0343  . hydrALAZINE (APRESOLINE) injection 10 mg  10 mg Intravenous Q6H PRN Lelon Perla, MD      . insulin aspart (novoLOG) injection 0-5 Units  0-5 Units Subcutaneous QHS Lelon Perla, MD   4 Units at 10/25/19 2207  . insulin aspart (novoLOG) injection 0-9 Units  0-9 Units Subcutaneous TID WC Lelon Perla, MD   1 Units at 10/28/19 [phone]  . isosorbide mononitrate (IMDUR) 24 hr tablet 30 mg  30 mg Oral QPC lunch Lelon Perla, MD   30 mg at 10/27/19 1233  . multivitamin (RENA-VIT) tablet 1 tablet  1 tablet Oral QPC lunch Lelon Perla, MD   1 tablet at 10/27/19 2141  . ondansetron (ZOFRAN) injection 4 mg  4 mg Intravenous Q6H PRN Lelon Perla, MD   4 mg at 10/24/19 0615  . oxyCODONE (Oxy IR/ROXICODONE) immediate release tablet 5 mg  5 mg Oral Q6H PRN Lelon Perla, MD   5 mg at 10/25/19 0831  . senna-docusate (Senokot-S) tablet 1 tablet  1 tablet Oral QHS PRN Lelon Perla, MD      . warfarin (COUMADIN) tablet 5 mg  5 mg Oral ONCE-1800 Swayze, Ava, DO      . Warfarin - Pharmacist Dosing Inpatient   Does not apply q1800 Swayze, Ava, DO       Facility-Administered Medications Ordered in Other Encounters  Medication Dose Route Frequency Provider Last Rate Last Dose  . 0.9 %  sodium chloride infusion    Intravenous Continuous Monia Sabal, PA-C         Discharge Medications: Please see discharge summary for a list of discharge medications.  Relevant Imaging Results:  Relevant Lab Results:   Additional Information SSN: [id number]; HD Javier Docker; T, TH, S, COVID negative  Haley Roza B Lucita Montoya, LCSWA

## 2019-10-28 NOTE — Progress Notes (Signed)
PROGRESS NOTE  Mairin Brower [id number] DOB: [birth year] DOA: 10/20/2019 PCP: Manon Hilding, MD  Brief History   Neya McCorkleis a 68 y.o.femalewith medical history ofESRD (MWF), diabetes mellitus type 2, stroke, hypertension, hyperlipidemia presented with left-sided weakness,  when she woke up to get ready for dialysis at 4 AM on 10/20/2019, the patient noted left-sided weakness and had difficulty ambulating. Shw was found to have right basal ganglia infarcts on MRI. Her hospitalization complicated by fevers of 101, and finding of  right IJ DVT on Dopplers. She will require 3 months of anticoagulation for this. She is now found to have recurrent Pseudomonas bacteremia. Right IJ  HD catheter was removed on 10/23/2019. TEE performed on 10/26/2019 demonstrates large vegetation of mitral valve. CTS consulted. Recent history of stroke raises a possible barrier to near-term surgical intervention.  Permissive hypertension is in effect.  Pt had new tunneled catheter placed on 10/26/2019 for the continuation of HD. She went to HD last night. Following HD she had altered mental status. MRI was performed. It demonstrated chronic multiple small ischemic strokes and interval development of a left 4 mm cortical infarct.   Dr. Prescott Gum for CTS feels that the best course at this time is continuation of IV antibiotics. He does not feel that there is a currently a role for mitral valve repair/replacement due to risk for recurrent infection of a new bioprosthetic valve. She will need to have negative blood cultures for 3 weeks before surgery could be considered. She will also need improvement in her functional/nutritional status.  Dental Panorex is pending. May need dental evaluation.  Consultants  . Infectious disease . Nephrology . Neurology . Cardiology . Cardiothoracic surgery  Procedures  . Dialysis . Placement of new HD Tunnelled catheter  Antibiotics   Anti-infectives (From admission,  onward)   Start     Dose/Rate Route Frequency Ordered Stop   10/26/19 1800  ciprofloxacin (CIPRO) tablet 500 mg     500 mg Oral Daily-1800 10/26/19 0924     10/26/19 1541  ceFAZolin (ANCEF) 2-4 GM/100ML-% IVPB    Note to Pharmacy: Manuela Neptune   : cabinet override      10/26/19 1541 10/26/19 1642   10/24/19 2000  ceFEPIme (MAXIPIME) 1 g in sodium chloride 0.9 % 100 mL IVPB     1 g 200 mL/hr over 30 Minutes Intravenous Every 24 hours 10/23/19 1100     10/23/19 1200  ceFEPIme (MAXIPIME) 2 g in sodium chloride 0.9 % 100 mL IVPB     2 g 200 mL/hr over 30 Minutes Intravenous Every Mon (Hemodialysis) 10/23/19 1044 10/23/19 1217   10/23/19 0745  ceFEPIme (MAXIPIME) 2 g in sodium chloride 0.9 % 100 mL IVPB  Status:  Discontinued     2 g 200 mL/hr over 30 Minutes Intravenous  Once 10/23/19 0736 10/23/19 1115     Subjective  The patient is resting comfortably. No new complaints. Son, [name] is at bedside.  Objective   Vitals:  Vitals:   10/28/19 1513 10/28/19 1715  BP: (!) 169/79   Pulse: (!) 103   Resp: 15 19  Temp: 98.1 F (36.7 C)   SpO2: 100%    Exam:  Constitutional:  . The patient is awake and alert. She is confused. No acute distress. Eyes:  . EOMBI . PERRLA . Normal lids and conjunctivae Respiratory:  . No increased work of breathing. . No wheezes, rales, or rhonchi . No tactile fremitus Cardiovascular:  . Regular rate and rhythm .  No murmurs, ectopy, or gallups. . No lateral PMI. No thrills. Abdomen:  . Abdomen is soft, non-tender, non-distended . No hernias, masses, or organomegaly . Normoactive bowel sounds.  Musculoskeletal:  . No cyanosis, clubbing, or edema Skin:  . No rashes, lesions, ulcers . palpation of skin: no induration or nodules Neurologic:  . CN 2-12 intact . Sensation all 4 extremities intact Psychiatric:  . Mental status: Confused  I have personally reviewed the following:   Today's Data  . Vitals, BMP, CBC .  Micro Data  . Blood  cultures: 2/2 positive for pseudomonas aeruginosa . Surveillance cultures 2/2 (10/24/2019) Have had no growth.  Imaging  . MRI left hip:  o No findings suspicious for osteomyelitis or septic arthritis o Edema of the paraspinal and psoas muscles o Osteodystrophy . MRI brain 10/26/2019: New left cortical infarct.  Scheduled Meds: . acetaminophen  1,000 mg Oral Once  . calcium carbonate  1 tablet Oral 2 times per day  . carvedilol  6.25 mg Oral BID  . Chlorhexidine Gluconate Cloth  6 each Topical Q0600  . ciprofloxacin  500 mg Oral q1800  . doxercalciferol  1.5 mcg Intravenous Q M,W,F-HD  . feeding supplement (ENSURE ENLIVE)  237 mL Oral BID BM  . gabapentin  100 mg Oral QHS  . insulin aspart  0-5 Units Subcutaneous QHS  . insulin aspart  0-9 Units Subcutaneous TID WC  . isosorbide mononitrate  30 mg Oral QPC lunch  . multivitamin  1 tablet Oral QPC lunch  . Warfarin - Pharmacist Dosing Inpatient   Does not apply q1800   Continuous Infusions: . sodium chloride Stopped (10/20/19 1317)  . ceFEPime (MAXIPIME) IV Stopped (10/28/19 [id number])  . heparin 1,150 Units/hr (10/28/19 0343)    Active Problems:   ESRD needing dialysis ([place])   Anemia of chronic renal failure   Type 2 diabetes mellitus with diabetic nephropathy ([facility])   Bacteremia due to Pseudomonas   Acute ischemic stroke ([place])   Acute left-sided weakness   Acute bacterial endocarditis   LOS: 8 days   A & P  Acute ischemic stroke: quite possibly due to septic emboli from large mitral valve vegetation.  MRI notes right basal ganglia infarcts, continues to have significant left-sided deficits. TEE performed today demonstrates a large vegetation on the mitral valve with possible destruction of the valve. Carotid duplex is unremarkable, no PFO noted on TCD bubble study. Patient was on aspirin and Plavix at baseline which was continued however given concomitant new finding of acute DVT in her right IJ she was started on IV heparin  without bolus, after discussion with neurology Dr. Erlinda Hong. Aspirin and Plavix discontinued 11/29. Hemoglobin A1c is 7.6, LDL is 65. I appreciate the assistance of neurology. CIR has been recommended and they have been consulted. New left cortical infarct identified on MRI last night.  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 It was replaced again this afternoon. Continue IV Cefepime. MRI of left hip demonstrated osteodystrophy, but no signs of osteomyelitis. There was some edema associated with the paraspinal myscles and the psoas on the left. Back in the end of August when she had the first episode of  Pseudomonas bacteremia and there was concern for possible discitis of L3-L4, this was later felt to be renal spondyloarthropathy on repeat imaging. Monitor. I appreciate infectious disease's assistance. Surveillance cultures have had no growth. She will require a further 6 weeks of antibiotics.  Mitral Valve Vegetation: Dr. Prescott Gum for CTS feels that the best course at this time is continuation of IV antibiotics. He does not feel that there is a currently a role for mitral valve repair/replacement due to risk for recurrent infection of a new bioprosthetic valve. She will need to have negative blood cultures for 3 weeks before surgery could be considered. She will also need improvement in her functional/nutritional status. Continue cefepime.  Right IJ acute DVT: This is secondary to her HD catheter, discussed with renal, given concurrent bacteremia will need HD catheter removal. Treat with anticoagulation for 3 months after catheter removal. As ESRD  options are limited, we will continue IV heparin without bolus and aim for a lower PTT, and  hold off on Coumadin until new dialysis access issues sorted out. Coumadin has been restarted.  ESRD: Nephrology consulted, usually dialyzes Monday Wednesday Friday. Right IJ HD catheter removed 11/30, last HD was yesterday 11/30. HD catheter placed again on 10/26/2019. HD performed yesterday evening.  Diabetes mellitus type 2: Hemoglobin A1c is 7.6. She is on a NovoLog sliding scale.  Essential hypertension: Restart Coreg, amlodipine on hold. Hydralazine prn SBP >220  Anemia of chronic disease: Per renal.  I have seen and examined this patient myself. I have spent 35 minutes in her evaluation and care. More than 50% of this was spent in consultation with her son, [name]. All questions answered to the best of my ability.  DVT prophylaxis: Heparin IV Code Status: DNR Family Communication: Son, [name] was  at bedside. Disposition Plan: Possibly CIR when above work-up completed  Thomasina Housley, DO [facility] Direct contact: see [email or web address]  7PM-7AM contact night coverage as above 10/28/2019, 5:31 PM  LOS: 5 days

## 2019-10-28 NOTE — Progress Notes (Signed)
Patient ID: [name], female   DOB: Mar 29, 1951, 68 y.o.   MRN: [id number]  [facility] Progress Note   Assessment/ Plan:   1.  Recurrent Pseudomonas bacteremia (suspected HD CRB) with evidence of mitral valve endocarditis prompting exchange of hemodialysis catheter awaiting repeat blood cultures and recommendations by ID.  Seen yesterday by cardiothoracic surgery with recommendations in place for medical management at this time as well as Panorex. 2. ESRD: We will continue hemodialysis on TTS schedule with dialysis today.  She is euvolemic on exam and does not have any acute electrolyte abnormality. 3. Anemia: Without overt blood loss, continue to monitor on ESA 4. CKD-MBD: Phosphorus level within acceptable range, monitor on renal diet/binders 5.  Acute CVA with evidence of chronic ischemia/old infarcts: Ongoing management per neurology, evidence of mitral valve endocarditis. 6. Hypertension: Improving blood pressure noted status post recent CVA.  Subjective:   Denies any acute events overnight, does not recall being seen by cardiothoracic surgery yesterday.   Objective:   BP 139/80 (BP Location: Left Arm)   Pulse 88   Temp 99.5 F (37.5 C) (Oral)   Resp 13   Ht 5\' 7"  (1.702 m)   Wt 63.9 kg   SpO2 96%   BMI 22.06 kg/m   Physical Exam: Gen: Sleeping comfortably in bed, easy to awaken/engage in conversation CVS: Regular rhythm, normal rate, S1 and S2 normal Resp: Diminished breath sounds over bases, no audible rales or rhonchi Abd: Soft, flat, nontender Ext: No lower extremity edema.  Right IJ [facility]  Labs: BMET Recent Labs  Lab 10/22/19 0735 10/23/19 0640 10/24/19 0419 10/25/19 0314 10/26/19 0353 10/27/19 0255 10/28/19 0314  NA 138 136 136 137 137 134* 136  K 4.8 4.8 4.6 4.8 4.9 3.6 4.1  CL 98 98 98 98 97* 95* 98  CO2 22 22 24  21* 23 24 25   GLUCOSE 148* 174* 104* 113* 111* 128* 146*  BUN 74* 87* 40* 53* 68* 20 38*  CREATININE 10.27* 12.23* 7.33*  9.14* 10.57* 4.76* 6.70*  CALCIUM 8.5* 8.1* 8.2* 8.4* 8.1* 8.0* 8.4*  PHOS 4.3 4.9* 4.9* 5.5* 6.4* 3.6 5.0*   CBC Recent Labs  Lab 10/25/19 0314 10/26/19 0353 10/27/19 0255 10/28/19 0314  WBC 10.2 9.9 11.6* 12.5*  HGB 7.4* 8.2* 8.2* 7.0*  HCT 23.4* 24.5* 25.6* 21.9*  MCV 90.7 88.4 88.6 89.4  PLT 200 216 200 201    Medications:    . acetaminophen  1,000 mg Oral Once  . calcium carbonate  1 tablet Oral 2 times per day  . carvedilol  6.25 mg Oral BID  . Chlorhexidine Gluconate Cloth  6 each Topical Q0600  . ciprofloxacin  500 mg Oral q1800  . doxercalciferol  1.5 mcg Intravenous Q M,W,F-HD  . feeding supplement (ENSURE ENLIVE)  237 mL Oral BID BM  . gabapentin  100 mg Oral QHS  . insulin aspart  0-5 Units Subcutaneous QHS  . insulin aspart  0-9 Units Subcutaneous TID WC  . isosorbide mononitrate  30 mg Oral QPC lunch  . multivitamin  1 tablet Oral QPC lunch  . Warfarin - Pharmacist Dosing Inpatient   Does not apply [id number]   Elmarie Shiley, MD 10/28/2019, 8:52 AM

## 2019-10-29 DIAGNOSIS — T827XXA Infection and inflammatory reaction due to other cardiac and vascular devices, implants and grafts, initial encounter: Secondary | ICD-10-CM

## 2019-10-29 LAB — CBC
HCT: 21.6 % — ABNORMAL LOW (ref 36.0–46.0)
Hemoglobin: 6.7 g/dL — CL (ref 12.0–15.0)
MCH: 28.8 pg (ref 26.0–34.0)
MCHC: 31 g/dL (ref 30.0–36.0)
MCV: 92.7 fL (ref 80.0–100.0)
Platelets: 201 10*3/uL (ref 150–400)
RBC: 2.33 MIL/uL — ABNORMAL LOW (ref 3.87–5.11)
RDW: 16.4 % — ABNORMAL HIGH (ref 11.5–15.5)
WBC: 14 10*3/uL — ABNORMAL HIGH (ref 4.0–10.5)
nRBC: 0 % (ref 0.0–0.2)

## 2019-10-29 LAB — PROTIME-INR
INR: 1.6 — ABNORMAL HIGH (ref 0.8–1.2)
Prothrombin Time: 18.6 seconds — ABNORMAL HIGH (ref 11.4–15.2)

## 2019-10-29 LAB — CULTURE, BLOOD (ROUTINE X 2)
Culture: NO GROWTH
Culture: NO GROWTH
Special Requests: ADEQUATE

## 2019-10-29 LAB — RENAL FUNCTION PANEL
Albumin: 1.7 g/dL — ABNORMAL LOW (ref 3.5–5.0)
Anion gap: 11 (ref 5–15)
BUN: 12 mg/dL (ref 8–23)
CO2: 27 mmol/L (ref 22–32)
Calcium: 8.6 mg/dL — ABNORMAL LOW (ref 8.9–10.3)
Chloride: 100 mmol/L (ref 98–111)
Creatinine, Ser: 3.67 mg/dL — ABNORMAL HIGH (ref 0.44–1.00)
GFR calc Af Amer: 14 mL/min — ABNORMAL LOW (ref >60)
GFR calc non Af Amer: 12 mL/min — ABNORMAL LOW (ref >60)
Glucose, Bld: 108 mg/dL — ABNORMAL HIGH (ref 70–99)
Phosphorus: 3.6 mg/dL (ref 2.5–4.6)
Potassium: 4.2 mmol/L (ref 3.5–5.1)
Sodium: 138 mmol/L (ref 135–145)

## 2019-10-29 LAB — GLUCOSE, CAPILLARY
Glucose-Capillary: 105 mg/dL — ABNORMAL HIGH (ref 70–99)
Glucose-Capillary: 107 mg/dL — ABNORMAL HIGH (ref 70–99)
Glucose-Capillary: 120 mg/dL — ABNORMAL HIGH (ref 70–99)
Glucose-Capillary: 131 mg/dL — ABNORMAL HIGH (ref 70–99)
Glucose-Capillary: 253 mg/dL — ABNORMAL HIGH (ref 70–99)
Glucose-Capillary: 255 mg/dL — ABNORMAL HIGH (ref 70–99)

## 2019-10-29 LAB — PREPARE RBC (CROSSMATCH)

## 2019-10-29 LAB — HEPARIN LEVEL (UNFRACTIONATED)
Heparin Unfractionated: 0.26 IU/mL — ABNORMAL LOW (ref 0.30–0.70)
Heparin Unfractionated: 0.44 IU/mL (ref 0.30–0.70)

## 2019-10-29 MED ORDER — WARFARIN SODIUM 5 MG PO TABS
5.0000 mg | ORAL_TABLET | Freq: Once | ORAL | Status: AC
  Administered 2019-10-29: 5 mg via ORAL
  Filled 2019-10-29: qty 1

## 2019-10-29 MED ORDER — SODIUM CHLORIDE 0.9% IV SOLUTION
Freq: Once | INTRAVENOUS | Status: AC
  Administered 2019-10-29: 12:00:00 via INTRAVENOUS

## 2019-10-29 MED ORDER — SODIUM CHLORIDE 0.9% IV SOLUTION: Freq: Once | INTRAVENOUS | Status: DC

## 2019-10-29 NOTE — Progress Notes (Signed)
Patient ID: [name], female   DOB: 05-21-1951, 68 y.o.   MRN: [id number]   [facility] Progress Note   Assessment/ Plan:   1.  Recurrent Pseudomonas bacteremia (suspected HD CRB) with evidence of mitral valve endocarditis prompting exchange of hemodialysis catheter awaiting repeat blood cultures and recommendations by ID.  No surgical indications for repair/replacement at this time; medical management amended with reevaluation post completion of antibiotics for bacteremia. 2. ESRD: While in the hospital, she was switched to a TTS schedule for dialysis-we will get her back onto her usual outpatient Monday/Wednesday/Friday schedule with hemodialysis tomorrow 3. Anemia: Without overt blood loss, continue to monitor on ESA 4. CKD-MBD: Phosphorus level within acceptable range, monitor on renal diet/binders 5.  Acute CVA with evidence of chronic ischemia/old infarcts: Ongoing management per neurology, evidence of mitral valve endocarditis. 6. Hypertension: Improving blood pressure noted status post recent CVA.  Subjective:   Denies any complaints at this time-went for dialysis yesterday.   Objective:   BP (!) 160/82 (BP Location: Left Arm)   Pulse 85   Temp 99.1 F (37.3 C) (Oral)   Resp 15   Ht 5\' 7"  (1.702 m)   Wt 63.9 kg   SpO2 100%   BMI 22.06 kg/m   Physical Exam: Gen: Sleeping comfortably in bed, easy to awaken/engage in conversation CVS: Regular rhythm, normal rate, S1 and S2 normal Resp: Diminished breath sounds over bases, no audible rales or rhonchi Abd: Soft, flat, nontender Ext: No lower extremity edema.  Right IJ [facility]  Labs: BMET Recent Labs  Lab 10/23/19 0640 10/24/19 0419 10/25/19 0314 10/26/19 0353 10/27/19 0255 10/28/19 0314 10/29/19 0319  NA 136 136 137 137 134* 136 138  K 4.8 4.6 4.8 4.9 3.6 4.1 4.2  CL 98 98 98 97* 95* 98 100  CO2 22 24 21* 23 24 25 27   GLUCOSE 174* 104* 113* 111* 128* 146* 108*  BUN 87* 40* 53* 68* 20 38* 12   CREATININE 12.23* 7.33* 9.14* 10.57* 4.76* 6.70* 3.67*  CALCIUM 8.1* 8.2* 8.4* 8.1* 8.0* 8.4* 8.6*  PHOS 4.9* 4.9* 5.5* 6.4* 3.6 5.0* 3.6   CBC Recent Labs  Lab 10/26/19 0353 10/27/19 0255 10/28/19 0314 10/29/19 0319  WBC 9.9 11.6* 12.5* 14.0*  HGB 8.2* 8.2* 7.0* 6.7*  HCT 24.5* 25.6* 21.9* 21.6*  MCV 88.4 88.6 89.4 92.7  PLT 216 200 201 201    Medications:    . sodium chloride   Intravenous Once  . acetaminophen  1,000 mg Oral Once  . calcium carbonate  1 tablet Oral 2 times per day  . carvedilol  6.25 mg Oral BID  . Chlorhexidine Gluconate Cloth  6 each Topical Q0600  . ciprofloxacin  500 mg Oral q1800  . doxercalciferol  1.5 mcg Intravenous Q M,W,F-HD  . feeding supplement (ENSURE ENLIVE)  237 mL Oral BID BM  . gabapentin  100 mg Oral QHS  . insulin aspart  0-5 Units Subcutaneous QHS  . insulin aspart  0-9 Units Subcutaneous TID WC  . isosorbide mononitrate  30 mg Oral QPC lunch  . multivitamin  1 tablet Oral QPC lunch  . warfarin  5 mg Oral ONCE-1800  . Warfarin - Pharmacist Dosing Inpatient   Does not apply [id number]   Elmarie Shiley, MD 10/29/2019, 8:47 AM

## 2019-10-29 NOTE — Progress Notes (Signed)
PROGRESS NOTE  [name] [id number] DOB: [birth year] DOA: 10/20/2019 PCP: Manon Hilding, MD  Brief History   Diana McCorkleis a 68 y.o.femalewith medical history ofESRD (MWF), diabetes mellitus type 2, stroke, hypertension, hyperlipidemia presented with left-sided weakness,  when she woke up to get ready for dialysis at 4 AM on 10/20/2019, the patient noted left-sided weakness and had difficulty ambulating. Shw was found to have right basal ganglia infarcts on MRI. Her hospitalization complicated by fevers of 101, and finding of  right IJ DVT on Dopplers. She will require 3 months of anticoagulation for this. She is now found to have recurrent Pseudomonas bacteremia. Right IJ  HD catheter was removed on 10/23/2019. TEE performed on 10/26/2019 demonstrates large vegetation of mitral valve. CTS consulted. Recent history of stroke raises a possible barrier to near-term surgical intervention.  Permissive hypertension is in effect.  Pt had new tunneled catheter placed on 10/26/2019 for the continuation of HD. She went to HD last night. Following HD she had altered mental status. MRI was performed. It demonstrated chronic multiple small ischemic strokes and interval development of a left 4 mm cortical infarct.   Dr. Prescott Gum for CTS feels that the best course at this time is continuation of IV antibiotics. He does not feel that there is a currently a role for mitral valve repair/replacement due to risk for recurrent infection of a new bioprosthetic valve. She will need to have negative blood cultures for 3 weeks before surgery could be considered. She will also need improvement in her functional/nutritional status.  Dental Panorex could not be performed due to patient's ability to comply with exam.  Consultants  . Infectious disease . Nephrology . Neurology . Cardiology . Cardiothoracic surgery  Procedures  . Dialysis . Placement of new HD Tunnelled catheter  Antibiotics    Anti-infectives (From admission, onward)   Start     Dose/Rate Route Frequency Ordered Stop   10/26/19 1800  ciprofloxacin (CIPRO) tablet 500 mg     500 mg Oral Daily-1800 10/26/19 0924     10/26/19 1541  ceFAZolin (ANCEF) 2-4 GM/100ML-% IVPB    Note to Pharmacy: Manuela Neptune   : cabinet override      10/26/19 1541 10/26/19 1642   10/24/19 2000  ceFEPIme (MAXIPIME) 1 g in sodium chloride 0.9 % 100 mL IVPB     1 g 200 mL/hr over 30 Minutes Intravenous Every 24 hours 10/23/19 1100     10/23/19 1200  ceFEPIme (MAXIPIME) 2 g in sodium chloride 0.9 % 100 mL IVPB     2 g 200 mL/hr over 30 Minutes Intravenous Every Mon (Hemodialysis) 10/23/19 1044 10/23/19 1217   10/23/19 0745  ceFEPIme (MAXIPIME) 2 g in sodium chloride 0.9 % 100 mL IVPB  Status:  Discontinued     2 g 200 mL/hr over 30 Minutes Intravenous  Once 10/23/19 0736 10/23/19 1115     Subjective  The patient is resting comfortably. No new complaints.  Objective   Vitals:  Vitals:   10/29/19 1215 10/29/19 1258  BP: 128/75 135/64  Pulse: 88 87  Resp: 20 18  Temp: 98.9 F (37.2 C) 99.4 F (37.4 C)  SpO2: 96% 98%   Exam:  Constitutional:  . The patient is awake and alert. Her sensorium is unclear today. No acute distress. Eyes:  . EOMBI . PERRLA . Normal lids and conjunctivae Respiratory:  . No increased work of breathing. . No wheezes, rales, or rhonchi . No tactile fremitus Cardiovascular:  .  Regular rate and rhythm . No murmurs, ectopy, or gallups. . No lateral PMI. No thrills. Abdomen:  . Abdomen is soft, non-tender, non-distended . No hernias, masses, or organomegaly . Normoactive bowel sounds.  Musculoskeletal:  . No cyanosis, clubbing, or edema Skin:  . No rashes, lesions, ulcers . palpation of skin: no induration or nodules Neurologic:  . CN 2-12 intact . Sensation all 4 extremities intact Psychiatric:  . Mental status: Confused  I have personally reviewed the following:   Today's Data  .  Vitals, BMP, CBC, INR. .  Micro Data  . Blood cultures: 2/2 positive for pseudomonas aeruginosa . Surveillance cultures 2/2 (10/24/2019) Have had no growth.  Imaging  . MRI left hip:  o No findings suspicious for osteomyelitis or septic arthritis o Edema of the paraspinal and psoas muscles o Osteodystrophy . MRI brain 10/26/2019: New left cortical infarct.  Scheduled Meds: . sodium chloride   Intravenous Once  . acetaminophen  1,000 mg Oral Once  . calcium carbonate  1 tablet Oral 2 times per day  . carvedilol  6.25 mg Oral BID  . Chlorhexidine Gluconate Cloth  6 each Topical Q0600  . ciprofloxacin  500 mg Oral q1800  . doxercalciferol  1.5 mcg Intravenous Q M,W,F-HD  . feeding supplement (ENSURE ENLIVE)  237 mL Oral BID BM  . gabapentin  100 mg Oral QHS  . insulin aspart  0-5 Units Subcutaneous QHS  . insulin aspart  0-9 Units Subcutaneous TID WC  . isosorbide mononitrate  30 mg Oral QPC lunch  . multivitamin  1 tablet Oral QPC lunch  . warfarin  5 mg Oral ONCE-1800  . Warfarin - Pharmacist Dosing Inpatient   Does not apply q1800   Continuous Infusions: . sodium chloride Stopped (10/20/19 1317)  . ceFEPime (MAXIPIME) IV 1 g (10/28/19 2054)  . heparin 1,300 Units/hr (10/29/19 0935)    Active Problems:   ESRD needing dialysis ([place])   Anemia of chronic renal failure   Type 2 diabetes mellitus with diabetic nephropathy (HCC)   Bacteremia due to Pseudomonas   Acute ischemic stroke ([place])   Acute left-sided weakness   Acute bacterial endocarditis   LOS: 9 days   A & P  Acute ischemic stroke: quite possibly due to septic emboli from large mitral valve vegetation.  MRI notes right basal ganglia infarcts, continues to have significant left-sided deficits. TEE performed today demonstrates a large vegetation on the mitral valve with possible destruction of the valve. Carotid duplex is unremarkable, no PFO noted on TCD bubble study. Patient was on aspirin and Plavix at baseline  which was continued however given concomitant new finding of acute DVT in her right IJ she was started on IV heparin without bolus, after discussion with neurology Dr. Erlinda Hong. Aspirin and Plavix discontinued 11/29. Hemoglobin A1c is 7.6, LDL is 65. I appreciate the assistance of neurology. CIR has been recommended and they have been consulted. New left cortical infarct identified on MRI last night. Pt unable to tolerate panorex dental exam. It was cancelled.  Recurrent Pseudomonas bacteremia with large mitral valve vegetation: Likely secondary to HD catheter, previously treated with 6-week course of IV [name] which was completed 10/15, previous HD catheter was removed and she had a new right IJ tunneled catheter placed then after discussion with nephrology and infectious disease, HD catheter was removed 11/30 in IR for line holiday, repeat blood cultures today. It was replaced again this afternoon. Continue IV Cefepime. MRI of left hip demonstrated osteodystrophy, but  no signs of osteomyelitis. There was some edema associated with the paraspinal myscles and the psoas on the left. Back in the end of August when she had the first episode of  Pseudomonas bacteremia and there was concern for possible discitis of L3-L4, this was later felt to be renal spondyloarthropathy on repeat imaging. Monitor. I appreciate infectious disease's assistance. Surveillance cultures have had no growth. She will require a further 6 weeks of antibiotics.  Mitral Valve Vegetation: Dr. Prescott Gum for CTS feels that the best course at this time is continuation of IV antibiotics. He does not feel that there is a currently a role for mitral valve repair/replacement due to risk for recurrent infection of a new bioprosthetic valve. She will need to have negative blood cultures for 3 weeks before surgery could be considered. She will also need improvement in her functional/nutritional status. Continue cefepime.  Right IJ acute DVT: This is  secondary to her HD catheter, discussed with renal, given concurrent bacteremia will need HD catheter removal. Treat with anticoagulation for 3 months after catheter removal. As ESRD options are limited, we will continue IV heparin without bolus and aim for a lower PTT, and  hold off on Coumadin until new dialysis access issues sorted out. Coumadin has been restarted.  ESRD: Nephrology consulted, usually dialyzes Monday Wednesday Friday. Right IJ HD catheter removed 11/30, last HD was yesterday 11/30. HD catheter placed again on 10/26/2019. HD performed yesterday evening.  Diabetes mellitus type 2: Hemoglobin A1c is 7.6. She is on a NovoLog sliding scale.  Essential hypertension: Restart Coreg, amlodipine on hold. Hydralazine prn SBP >220  Anemia of chronic disease: Per renal.  I have seen and examined this patient myself. I have spent 32 minutes in her evaluation and care.  DVT prophylaxis: Heparin IV Code Status: DNR Family Communication: Son, [name] was  at bedside. Disposition Plan: Possibly CIR when above work-up completed  Aarav Burgett, DO [facility] Direct contact: see [email or web address]  7PM-7AM contact night coverage as above 10/29/2019, 2:44 PM  LOS: 5 days

## 2019-10-29 NOTE — Progress Notes (Signed)
[facility] for Infectious Disease    Date of Admission:  10/20/2019   Total days of antibiotics 7 cefepime/cipro          ID: [name] is a 68 y.o. female with recurrent pseudomonas bacteremia found to have gram negative native MV endocarditis c/b septic CNS emboli, and left sided weakness , previously treated for PsA bacteremia and early discitis Active Problems:   ESRD needing dialysis ([place])   Anemia of chronic renal failure   Type 2 diabetes mellitus with diabetic nephropathy (HCC)   Bacteremia due to Pseudomonas   Acute ischemic stroke ([place])   Acute left-sided weakness   Acute bacterial endocarditis    Subjective: Having fevers of 100.31F tmax yesterday and increasing leukocytosis noted on lab. Had tagged WBC scan that did not find further focal uptake  Medications:  . sodium chloride   Intravenous Once  . acetaminophen  1,000 mg Oral Once  . calcium carbonate  1 tablet Oral 2 times per day  . carvedilol  6.25 mg Oral BID  . Chlorhexidine Gluconate Cloth  6 each Topical Q0600  . ciprofloxacin  500 mg Oral q1800  . doxercalciferol  1.5 mcg Intravenous Q M,W,F-HD  . feeding supplement (ENSURE ENLIVE)  237 mL Oral BID BM  . gabapentin  100 mg Oral QHS  . insulin aspart  0-5 Units Subcutaneous QHS  . insulin aspart  0-9 Units Subcutaneous TID WC  . isosorbide mononitrate  30 mg Oral QPC lunch  . multivitamin  1 tablet Oral QPC lunch  . Warfarin - Pharmacist Dosing Inpatient   Does not apply q1800    Objective: Vital signs in last 24 hours: Temp:  [97.7 F (36.5 C)-100.8 F (38.2 C)] 99.7 F (37.6 C) (12/06 1635) Pulse Rate:  [83-99] 93 (12/06 1635) Resp:  [15-20] 16 (12/06 1635) BP: (107-160)/(64-83) 138/74 (12/06 1635) SpO2:  [96 %-100 %] 99 % (12/06 1635)     Lab Results Recent Labs    10/28/19 0314 10/29/19 0319  WBC 12.5* 14.0*  HGB 7.0* 6.7*  HCT 21.9* 21.6*  NA 136 138  K 4.1 4.2  CL 98 100  CO2 25 27  BUN 38* 12  CREATININE 6.70*  3.67*   Liver Panel Recent Labs    10/28/19 0314 10/29/19 0319  ALBUMIN 1.7* 1.7*    Microbiology: 12/1 blood cx ngtd Studies/Results: Nm Wbc Scan Tumor Loc Limited  Result Date: 10/27/2019 CLINICAL DATA:  Evaluate for occult infection EXAM: NUCLEAR MEDICINE LEUKOCYTE SCAN TECHNIQUE: Following intravenous administration of radiolabeled white blood cells, images of the head, neck, trunk, and extremities were obtained on subsequent days. RADIOPHARMACEUTICALS:  [id number] millicuries Tc 99 Ceretec labeled autologous leukocytes IV COMPARISON:  None. FINDINGS: There is a normal physiologic douche profusion of the radiopharmaceutical with radiotracer activity noted within the bone marrow, liver and spleen. There is increased radiotracer uptake within both lungs which may be due to poor cardiac output. Speckled areas of increased uptake in the right lower quadrant of the abdomen and in the region of the distal aspect of the right lower extremity noted, favor contamination artifact. No abnormal increased radiotracer uptake to identify suspected site of occult infection. IMPRESSION: 1. No focal areas of increased uptake identified to indicate site of suspected occult infection. Electronically Signed   By: Kerby Moors M.D.   On: 10/27/2019 20:59     Assessment/Plan: Concern for ongoing fevers, leukocytosis = will repeat blood cx to ensure that bacteremia has resolved. Continue on  dual therapy with cefepime plus cipro for the time being.  PsA native MV endocarditis with CNS septic emboli = continue on abtx plan for 6 wk. Day 1 depends on if furhter evidence of bacteremia.  Will provide further [facility] for Infectious Diseases Cell: [phone] Pager: [phone]

## 2019-10-29 NOTE — Progress Notes (Addendum)
Occupational Therapy Treatment Patient Details Name: [name] MRN: [id number] DOB: 1951-11-10 Today's Date: 10/29/2019    History of present illness [name] is a 68 y.o. female with history of ESRD ( on dialysis M-W-F), TIA 2010, HTN, HLD, Ha, DM2 who presented to  [facility] with c/o dizziness, left arm weakness and left leg numbness. MRI revealed acute R basal ganglia infarct. In addition, she has R IJ DVT and spiked a fever 11/27 with concern for sepsis.    OT comments  Pt. Seen for skilled OT treatment session.  Note: lower hemoglobin so session kept at bed level today.   Able to complete rolling and sidelying to sit and sit to sidelying mod a.  Sitting eob approx. 3-5 min. With max a for safety and stability.  Pt. Unable to maintain supported or un supported sitting to don brace.  States "im tired I have to sleep you gave me a workout"  Assisted back to bed per pt. Request.   Follow Up Recommendations  CIR;Supervision/Assistance - 24 hour    Equipment Recommendations  3 in 1 bedside commode    Recommendations for Other Services      Precautions / Restrictions Precautions Precautions: Fall Required Braces or Orthoses: Spinal Brace Spinal Brace: Thoracolumbosacral orthotic;Other (comment) Restrictions Weight Bearing Restrictions: No       Mobility Bed Mobility Overal bed mobility: Needs Assistance Bed Mobility: Rolling;Sidelying to Sit;Sit to Sidelying Rolling: Min assist Sidelying to sit: Mod assist     Sit to sidelying: Mod assist General bed mobility comments: Requires constant hands on assist to maintain sitting balance once assisted to edge of bed. Difficult to don brace due to patients inability to maintain sitting balance independently. could not complete donnign of brace. assisted back to bed. after approx. 3-5 min. of eob pt. closed eyes during return to bed and said she was sleeping.  was able to assist with minor repositioning once  back in bed  Transfers                      Balance                                           ADL either performed or assessed with clinical judgement   ADL Overall ADL's : Needs assistance/impaired Eating/Feeding: Set up;Bed level Eating/Feeding Details (indicate cue type and reason): eating sherbert upon arrival                                   General ADL Comments: deferred OOB mobility secondary to pt decreased balance sitting EOB and reports of increased lightheadedness;pt required maxA for sitting balance EOB     Vision       Perception     Praxis      Cognition Arousal/Alertness: Awake/alert Behavior During Therapy: WFL for tasks assessed/performed Overall Cognitive Status: Within Functional Limits for tasks assessed                                          Exercises     Shoulder Instructions       General Comments      Pertinent Vitals/ Pain  Pain Assessment: Faces Faces Pain Scale: Hurts even more Pain Location: left hip Pain Descriptors / Indicators: Grimacing;Moaning;Aching Pain Intervention(s): Limited activity within patient's tolerance;Repositioned  Home Living                                          Prior Functioning/Environment              Frequency  Min 2X/week        Progress Toward Goals  OT Goals(current goals can now be found in the care plan section)  Progress towards OT goals: Progressing toward goals     Plan      Co-evaluation                 AM-PAC OT "6 Clicks" Daily Activity     Outcome Measure   Help from another person eating meals?: A Little Help from another person taking care of personal grooming?: A Lot Help from another person toileting, which includes using toliet, bedpan, or urinal?: Total Help from another person bathing (including washing, rinsing, drying)?: A Lot Help from another person to put on and  taking off regular upper body clothing?: A Lot Help from another person to put on and taking off regular lower body clothing?: Total 6 Click Score: 11    End of Session    OT Visit Diagnosis: Unsteadiness on feet (R26.81);Other abnormalities of gait and mobility (R26.89);Muscle weakness (generalized) (M62.81);Low vision, both eyes (H54.2);Other symptoms and signs involving cognitive function;Pain Pain - Right/Left: Left Pain - part of body: Hip   Activity Tolerance Patient limited by pain   Patient Left in bed;with call bell/phone within reach;with bed alarm set   Nurse Communication  spoke with rn regarding lower hemoglobin so tx. Session limited to bed level activities secondary to pt. Reported fatigue receiving blood later today.         Time: [id number] OT Time Calculation (min): 16 min  Charges: OT General Charges $OT Visit: 1 Visit OT Treatments $Self Care/Home Management : 8-22 mins   Janice Coffin, COTA/L 10/29/2019, 11:26 AM

## 2019-10-29 NOTE — Progress Notes (Signed)
ANTICOAGULATION CONSULT NOTE - Follow-Up  Pharmacy Consult for Transitioning From Heparin to Warfarin Indication: RIJ DVT  No Known Allergies  Patient Measurements: Height: 5\' 7"  (170.2 cm) Weight: 140 lb 14 oz (63.9 kg) IBW/kg (Calculated) : 61.6 Heparin Dosing Weight: 63 kg  Vital Signs: Temp: 99.6 F (37.6 C) (12/06 0339) Temp Source: Oral (12/06 0339) BP: 107/64 (12/06 0339) Pulse Rate: 87 (12/06 0339)  Labs: Recent Labs    10/27/19 0255 10/28/19 0314 10/28/19 1211 10/29/19 0319  HGB 8.2* 7.0*  --  6.7*  HCT 25.6* 21.9*  --  21.6*  PLT 200 201  --  201  LABPROT  --  15.0  --  18.6*  INR  --  1.2  --  1.6*  HEPARINUNFRC 0.33 <0.10* 0.32 0.26*  CREATININE 4.76* 6.70*  --  3.67*    Estimated Creatinine Clearance: 14.1 mL/min (A) (by C-G formula based on SCr of 3.67 mg/dL (H)).  Assessment: 68 yr old female with ESRD who presented on 11/27 with new CVA and is now found to have RIJ DVT and pharmacy consulted to start heparin anticoagulation - low goal and no bolus with concurrent CVA. Pt rec'd new tunneled HD catheter on 12/3 for continuation of HD.  Heparin level slightly low 0.26 Warfarin started 12/4  INR now 1.6  hgb low today at 6.7, but seems to be hanging around 7 past couple days  Goal of Therapy:  Heparin level 0.3-0.5 units/ml  INR 2.0-3.0 Monitor platelets by anticoagulation protocol: Yes   Plan:  Increase heparin to 1300 units/hr Recheck at 1600 Warfarin 5 mg PO X 1 tonight Discontinue heparin infusion when INR therapeutic Daily INR HL CBC  Barth Kirks, PharmD, BCPS, BCCCP Clinical Pharmacist [phone]  Please check AMION for all [place] numbers  10/29/2019 8:00 AM

## 2019-10-29 NOTE — Progress Notes (Signed)
ANTICOAGULATION CONSULT NOTE - Follow-Up  Pharmacy Consult for Transitioning From Heparin to Warfarin Indication: RIJ DVT  No Known Allergies  Patient Measurements: Height: 5\' 7"  (170.2 cm) Weight: 140 lb 14 oz (63.9 kg) IBW/kg (Calculated) : 61.6 Heparin Dosing Weight: 63 kg  Vital Signs: Temp: 99.7 F (37.6 C) (12/06 1635) Temp Source: Oral (12/06 1635) BP: 138/74 (12/06 1635) Pulse Rate: 93 (12/06 1635)  Labs: Recent Labs    10/27/19 0255 10/28/19 0314 10/28/19 1211 10/29/19 0319 10/29/19 1702  HGB 8.2* 7.0*  --  6.7*  --   HCT 25.6* 21.9*  --  21.6*  --   PLT 200 201  --  201  --   LABPROT  --  15.0  --  18.6*  --   INR  --  1.2  --  1.6*  --   HEPARINUNFRC 0.33 <0.10* 0.32 0.26* 0.44  CREATININE 4.76* 6.70*  --  3.67*  --     Estimated Creatinine Clearance: 14.1 mL/min (A) (by C-G formula based on SCr of 3.67 mg/dL (H)).  Assessment: 68 yr old female with ESRD who presented on 11/27 with new CVA and is now found to have RIJ DVT and pharmacy consulted to start heparin anticoagulation - low goal and no bolus with concurrent CVA. Pt rec'd new tunneled HD catheter on 12/3 for continuation of HD.  Heparin level now at goal. No bleeding noted.   Goal of Therapy:  Heparin level 0.3-0.5 units/ml  Monitor platelets by anticoagulation protocol: Yes   Plan:  Continue heparin gtt 1300 units/hr Daily heparin level and CBC  Salome Arnt, PharmD, BCPS Clinical Pharmacist Please see AMION for all pharmacy numbers 10/29/2019 5:42 PM

## 2019-10-30 LAB — CBC
HCT: 26.2 % — ABNORMAL LOW (ref 36.0–46.0)
Hemoglobin: 8.3 g/dL — ABNORMAL LOW (ref 12.0–15.0)
MCH: 28.9 pg (ref 26.0–34.0)
MCHC: 31.7 g/dL (ref 30.0–36.0)
MCV: 91.3 fL (ref 80.0–100.0)
Platelets: 205 10*3/uL (ref 150–400)
RBC: 2.87 MIL/uL — ABNORMAL LOW (ref 3.87–5.11)
RDW: 16.2 % — ABNORMAL HIGH (ref 11.5–15.5)
WBC: 13.2 10*3/uL — ABNORMAL HIGH (ref 4.0–10.5)
nRBC: 0 % (ref 0.0–0.2)

## 2019-10-30 LAB — TYPE AND SCREEN
ABO/RH(D): A POS
Antibody Screen: NEGATIVE
Unit division: 0

## 2019-10-30 LAB — RENAL FUNCTION PANEL
Albumin: 1.7 g/dL — ABNORMAL LOW (ref 3.5–5.0)
Anion gap: 13 (ref 5–15)
BUN: 32 mg/dL — ABNORMAL HIGH (ref 8–23)
CO2: 25 mmol/L (ref 22–32)
Calcium: 8.5 mg/dL — ABNORMAL LOW (ref 8.9–10.3)
Chloride: 97 mmol/L — ABNORMAL LOW (ref 98–111)
Creatinine, Ser: 5.81 mg/dL — ABNORMAL HIGH (ref 0.44–1.00)
GFR calc Af Amer: 8 mL/min — ABNORMAL LOW (ref >60)
GFR calc non Af Amer: 7 mL/min — ABNORMAL LOW (ref >60)
Glucose, Bld: 175 mg/dL — ABNORMAL HIGH (ref 70–99)
Phosphorus: 4.7 mg/dL — ABNORMAL HIGH (ref 2.5–4.6)
Potassium: 4.5 mmol/L (ref 3.5–5.1)
Sodium: 135 mmol/L (ref 135–145)

## 2019-10-30 LAB — GLUCOSE, CAPILLARY
Glucose-Capillary: 182 mg/dL — ABNORMAL HIGH (ref 70–99)
Glucose-Capillary: 205 mg/dL — ABNORMAL HIGH (ref 70–99)
Glucose-Capillary: 236 mg/dL — ABNORMAL HIGH (ref 70–99)

## 2019-10-30 LAB — BPAM RBC
Blood Product Expiration Date: 202012282359
ISSUE DATE / TIME: 202012061236
Unit Type and Rh: 6200

## 2019-10-30 LAB — HEPARIN LEVEL (UNFRACTIONATED): Heparin Unfractionated: 0.34 IU/mL (ref 0.30–0.70)

## 2019-10-30 LAB — PROTIME-INR
INR: 2.3 — ABNORMAL HIGH (ref 0.8–1.2)
Prothrombin Time: 24.8 seconds — ABNORMAL HIGH (ref 11.4–15.2)

## 2019-10-30 MED ORDER — HEPARIN SODIUM (PORCINE) 1000 UNIT/ML IJ SOLN
INTRAMUSCULAR | Status: AC
  Administered 2019-10-30: 3200 [IU]
  Filled 2019-10-30: qty 4

## 2019-10-30 MED ORDER — DOXERCALCIFEROL 4 MCG/2ML IV SOLN
INTRAVENOUS | Status: AC
  Administered 2019-10-30: 1.5 ug via INTRAVENOUS
  Filled 2019-10-30: qty 2

## 2019-10-30 MED ORDER — WARFARIN SODIUM 2.5 MG PO TABS
2.5000 mg | ORAL_TABLET | Freq: Once | ORAL | Status: AC
  Administered 2019-10-30: 2.5 mg via ORAL
  Filled 2019-10-30: qty 1

## 2019-10-30 NOTE — Progress Notes (Signed)
PROGRESS NOTE  [name] [id number] DOB: 1951/01/16 DOA: 10/20/2019 PCP: Manon Hilding, MD  Brief History   [name] a 68 y.o.femalewith medical history ofESRD (MWF), diabetes mellitus type 2, stroke, hypertension, hyperlipidemia presented with left-sided weakness,  when she woke up to get ready for dialysis at 4 AM on 10/20/2019, the patient noted left-sided weakness and had difficulty ambulating. Shw was found to have right basal ganglia infarcts on MRI. Her hospitalization complicated by fevers of 101, and finding of  right IJ DVT on Dopplers. She will require 3 months of anticoagulation for this. She is now found to have recurrent Pseudomonas bacteremia. Right IJ  HD catheter was removed on 10/23/2019. TEE performed on 10/26/2019 demonstrates large vegetation of mitral valve. CTS consulted. Recent history of stroke raises a possible barrier to near-term surgical intervention.  Permissive hypertension is in effect.  Pt had new tunneled catheter placed on 10/26/2019 for the continuation of HD. She went to HD last night. Following HD she had altered mental status. MRI was performed. It demonstrated chronic multiple small ischemic strokes and interval development of a left 4 mm cortical infarct.   Dr. Prescott Gum for CTS feels that the best course at this time is continuation of IV antibiotics. He does not feel that there is a currently a role for mitral valve repair/replacement due to risk for recurrent infection of a new bioprosthetic valve. She will need to have negative blood cultures for 3 weeks before surgery could be considered. She will also need improvement in her functional/nutritional status.  Dental Panorex could not be performed due to patient's ability to comply with exam. Tagged RBC scan negative on 10/27/2019.  Consultants   Infectious disease  Nephrology  Neurology  Cardiology  Cardiothoracic surgery  Procedures   Dialysis  Placement of new HD  Tunnelled catheter  Antibiotics   Anti-infectives (From admission, onward)   Start     Dose/Rate Route Frequency Ordered Stop   10/26/19 1800  ciprofloxacin (CIPRO) tablet 500 mg     500 mg Oral Daily-1800 10/26/19 0924     10/26/19 1541  ceFAZolin (ANCEF) 2-4 GM/100ML-% IVPB    Note to Pharmacy: Manuela Neptune   : cabinet override      10/26/19 1541 10/26/19 1642   10/24/19 2000  ceFEPIme (MAXIPIME) 1 g in sodium chloride 0.9 % 100 mL IVPB     1 g 200 mL/hr over 30 Minutes Intravenous Every 24 hours 10/23/19 1100     10/23/19 1200  ceFEPIme (MAXIPIME) 2 g in sodium chloride 0.9 % 100 mL IVPB     2 g 200 mL/hr over 30 Minutes Intravenous Every Mon (Hemodialysis) 10/23/19 1044 10/23/19 1217   10/23/19 0745  ceFEPIme (MAXIPIME) 2 g in sodium chloride 0.9 % 100 mL IVPB  Status:  Discontinued     2 g 200 mL/hr over 30 Minutes Intravenous  Once 10/23/19 0736 10/23/19 1115     Subjective  The patient is resting comfortably. T max of 99.7 in the last 24 hours. No new complaints.  Objective   Vitals:  Vitals:   10/30/19 1700 10/30/19 1730  BP: 122/71 (!) 131/49  Pulse: 85 61  Resp:    Temp:    SpO2:     Exam:  Constitutional:   The patient is awake and alert. Her sensorium is improved today. No acute distress. Eyes:   EOMBI  PERRLA  Normal lids and conjunctivae Respiratory:   No increased work of breathing.  No wheezes,  rales, or rhonchi  No tactile fremitus Cardiovascular:   Regular rate and rhythm  No murmurs, ectopy, or gallups.  No lateral PMI. No thrills. Abdomen:   Abdomen is soft, non-tender, non-distended  No hernias, masses, or organomegaly  Normoactive bowel sounds.  Musculoskeletal:   No cyanosis, clubbing, or edema Skin:   No rashes, lesions, ulcers  palpation of skin: no induration or nodules Neurologic:   CN 2-12 intact  Sensation all 4 extremities intact Psychiatric:   Mental status: Confused  I have personally reviewed the  following:   Today's Data   Vitals, BMP, CBC, INR.   Micro Data   Blood cultures: 2/2 positive for pseudomonas aeruginosa  Surveillance cultures 2/2 (10/24/2019) Have had no growth.  Imaging   MRI left hip:  o No findings suspicious for osteomyelitis or septic arthritis o Edema of the paraspinal and psoas muscles o Osteodystrophy  MRI brain 10/26/2019: New left cortical infarct  Tagged WBC scan 10/27/2019  Scheduled Meds:  sodium chloride   Intravenous Once   acetaminophen  1,000 mg Oral Once   calcium carbonate  1 tablet Oral 2 times per day   carvedilol  6.25 mg Oral BID   Chlorhexidine Gluconate Cloth  6 each Topical Q0600   ciprofloxacin  500 mg Oral q1800   doxercalciferol  1.5 mcg Intravenous Q M,W,F-HD   feeding supplement (ENSURE ENLIVE)  237 mL Oral BID BM   gabapentin  100 mg Oral QHS   insulin aspart  0-5 Units Subcutaneous QHS   insulin aspart  0-9 Units Subcutaneous TID WC   isosorbide mononitrate  30 mg Oral QPC lunch   multivitamin  1 tablet Oral QPC lunch   warfarin  2.5 mg Oral ONCE-1800   Warfarin - Pharmacist Dosing Inpatient   Does not apply q1800   Continuous Infusions:  sodium chloride Stopped (10/20/19 1317)   ceFEPime (MAXIPIME) IV Stopped (10/29/19 2030)   heparin 1,300 Units/hr (10/30/19 1757)    Active Problems:   ESRD needing dialysis ([place])   Anemia of chronic renal failure   Type 2 diabetes mellitus with diabetic nephropathy (HCC)   Bacteremia due to Pseudomonas   Acute ischemic stroke ([place])   Acute left-sided weakness   Acute bacterial endocarditis   LOS: 10 days   A & P  Acute ischemic stroke: quite possibly due to septic emboli from large mitral valve vegetation.  MRI notes right basal ganglia infarcts, continues to have significant left-sided deficits. TEE performed today demonstrates a large vegetation on the mitral valve with possible destruction of the valve. Carotid duplex is unremarkable, no PFO noted on  TCD bubble study. Patient was on aspirin and Plavix at baseline which was continued however given concomitant new finding of acute DVT in her right IJ she was started on IV heparin without bolus, after discussion with neurology Dr. Erlinda Hong. Aspirin and Plavix discontinued 11/29. Hemoglobin A1c is 7.6, LDL is 65. I appreciate the assistance of neurology. CIR has been recommended and they have been consulted. New left cortical infarct identified on MRI last night. Pt unable to tolerate panorex dental exam. It was cancelled. Tagged WBC scan performed on 10/27/2019 did not demonstrate focal consolidation consistent with occult site of infection.  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 It was replaced again this afternoon. Continue IV Cefepime. MRI of left hip demonstrated osteodystrophy, but no signs of osteomyelitis. There was some edema associated with the paraspinal myscles and the psoas on the left. Back in the end of August when she had the first episode of  Pseudomonas bacteremia and there was concern for possible discitis of L3-L4, this was later felt to be renal spondyloarthropathy on repeat imaging. Monitor. I appreciate infectious disease's assistance. Surveillance cultures have had no growth. She will require a further 6 weeks of antibiotics. Day one determination is still pending.  Mitral Valve Vegetation: Dr. Prescott Gum for CTS feels that the best course at this time is continuation of IV antibiotics. He does not feel that there is a currently a role for mitral valve repair/replacement due to risk for recurrent infection of a new bioprosthetic  valve. She will need to have negative blood cultures for 3 weeks before surgery could be considered. She will also need improvement in her functional/nutritional status. Continue cefepime.  Right IJ acute DVT: This is secondary to her HD catheter, discussed with renal, given concurrent bacteremia will need HD catheter removal. Treat with anticoagulation for 3 months after catheter removal. As ESRD options are limited, we will continue IV heparin without bolus and aim for a lower PTT, and  hold off on Coumadin until new dialysis access issues sorted out. Coumadin has been restarted.  ESRD: Nephrology consulted, usually dialyzes Monday Wednesday Friday. Right IJ HD catheter removed 11/30, last HD was yesterday 11/30. HD catheter placed again on 10/26/2019. HD performed yesterday evening.  Diabetes mellitus type 2: Hemoglobin A1c is 7.6. She is on a NovoLog sliding scale.  Essential hypertension: Restart Coreg, amlodipine on hold. Hydralazine prn SBP >220  Anemia of chronic disease: Per renal.  I have seen and examined this patient myself. I have spent 30 minutes in her evaluation and care.  DVT prophylaxis: Heparin IV Code Status: DNR Family Communication: Son, [name] was  at bedside. Disposition Plan: Possibly CIR when above work-up completed  Esmirna Ravan, DO [facility] Direct contact: see [email or web address]  7PM-7AM contact night coverage as above 10/30/2019, 6:04 PM  LOS: 5 days

## 2019-10-30 NOTE — Progress Notes (Signed)
Occupational Therapy Treatment Patient Details Name: [name] MRN: [id number] DOB: August 25, 1951 Today's Date: 10/30/2019    History of present illness [name] is a 68 y.o. female with history of ESRD ( on dialysis M-W-F), TIA 2010, HTN, HLD, Ha, DM2 who presented to  [facility] with c/o dizziness, left arm weakness and left leg numbness. MRI revealed acute R basal ganglia infarct. In addition, she has R IJ DVT and spiked a fever 11/27 with concern for sepsis.    OT comments  Pt making progress towards goals but limited by pain this session. Pt with no c/o pain when found laying in bed. Pt needing max cuing for hand positioning and technique with bed mobility. Pt sitting on EOB with min - mod A sitting balance and needing total A to don brace. Pt standing with mod lifting assistance for standard height bed into stedy but only able to maintain upright position for 10 seconds before returning to sitting and reporting 9/10 pain in lower back. RN arrived with medication. OT doffed brace and pt requesting to return to supine. This therapist later returned to obtain equipment in the room and caregiver present with no mask on but was agreeable to don when asked. OT discussed how morning session went with caregiver and answered questions. Pt would continue to benefit from OT intervention.    Follow Up Recommendations  CIR;Supervision/Assistance - 24 hour    Equipment Recommendations  Other (comment)(defer to next venue of care)    Recommendations for Other Services      Precautions / Restrictions Precautions Precautions: Fall Required Braces or Orthoses: Spinal Brace Spinal Brace: Thoracolumbosacral orthotic;Other (comment) Restrictions Weight Bearing Restrictions: No       Mobility Bed Mobility Overal bed mobility: Needs Assistance Bed Mobility: Rolling;Sidelying to Sit;Sit to Sidelying Rolling: Min assist   Supine to sit: Mod assist;HOB elevated Sit to  supine: Mod assist   General bed mobility comments: Pt required max cuing for hand placement and technique  Transfers Overall transfer level: Needs assistance Equipment used: None(stedy) Transfers: Sit to/from Stand Sit to Stand: Mod assist         General transfer comment: mod A to stand within stedy but able to maintain for 10 seconds before returning to bed    Balance Overall balance assessment: Needs assistance Sitting-balance support: Feet supported;Bilateral upper extremity supported Sitting balance-Leahy Scale: Poor Sitting balance - Comments: required min - mod A for sitting balance while therapist attempting to don back brace on EOB Postural control: Posterior lean;Left lateral lean Standing balance support: Bilateral upper extremity supported;During functional activity Standing balance-Leahy Scale: Poor                             ADL either performed or assessed with clinical judgement     Cognition Arousal/Alertness: Awake/alert Behavior During Therapy: WFL for tasks assessed/performed Overall Cognitive Status: Within Functional Limits for tasks assessed                        Pertinent Vitals/ Pain       Pain Assessment: 0-10 Pain Score: 9  Pain Location: lower back Pain Descriptors / Indicators: Grimacing;Moaning;Aching Pain Intervention(s): Limited activity within patient's tolerance;Monitored during session;Repositioned;RN gave pain meds during session;Patient requesting pain meds-RN notified         Frequency  Min 2X/week        Progress Toward Goals  OT Goals(current goals  can now be found in the care plan section)  Progress towards OT goals: Progressing toward goals  Acute Rehab OT Goals Patient Stated Goal: to move better OT Goal Formulation: With patient Time For Goal Achievement: 11/13/19 Potential to Achieve Goals: Good  Plan Discharge plan remains appropriate       AM-PAC OT "6 Clicks" Daily Activity      Outcome Measure   Help from another person eating meals?: A Little Help from another person taking care of personal grooming?: A Lot Help from another person toileting, which includes using toliet, bedpan, or urinal?: Total Help from another person bathing (including washing, rinsing, drying)?: A Lot Help from another person to put on and taking off regular upper body clothing?: A Lot Help from another person to put on and taking off regular lower body clothing?: Total 6 Click Score: 11    End of Session Equipment Utilized During Treatment: (stedy)  OT Visit Diagnosis: Unsteadiness on feet (R26.81);Other abnormalities of gait and mobility (R26.89);Muscle weakness (generalized) (M62.81);Low vision, both eyes (H54.2);Other symptoms and signs involving cognitive function;Pain Pain - part of body: (back)   Activity Tolerance Patient limited by pain   Patient Left in bed;with call bell/phone within reach;with bed alarm set   Nurse Communication Mobility status;Patient requests pain meds        Time: [id number] OT Time Calculation (min): 19 min  Charges: OT General Charges $OT Visit: 1 Visit OT Treatments $Therapeutic Activity: 8-22 mins   Gypsy Decant MS, OTR/L 10/30/2019, 11:27 AM

## 2019-10-30 NOTE — Progress Notes (Signed)
[facility]   Reason for visit: Follow up on recurrent Pseudomonal BSI/MVIE  Antibiotics: Cefepime, day 8 Cipro, day 5  Interval History: Last fever was late Friday/early Saturday morning. Appetite and ambulatory efforts continue to improve. She tolerated HD well on Saturday. HD planned for later today per son who is present at bedside. She has been approached re: further care at [facility] once she is deemed as stable for hospital d/c. Fever curve, WBC & Cr trends, imaging, cx results, and ABX usage all independently reviewed    Current Facility-Administered Medications:    0.9 %  sodium chloride infusion (Manually program via Guardrails IV Fluids), , Intravenous, Once, Blount, Xenia T, NP   0.9 %  sodium chloride infusion, , Intravenous, Continuous, Crenshaw, Denice Bors, MD, Stopped at 10/20/19 1317   acetaminophen (TYLENOL) tablet 650 mg, 650 mg, Oral, Q4H PRN, 650 mg at 10/29/19 1706 **OR** acetaminophen (TYLENOL) 160 MG/5ML solution 650 mg, 650 mg, Per Tube, Q4H PRN **OR** acetaminophen (TYLENOL) suppository 650 mg, 650 mg, Rectal, Q4H PRN, Stanford Breed, Denice Bors, MD   acetaminophen (TYLENOL) tablet 1,000 mg, 1,000 mg, Oral, Once, Crenshaw, Denice Bors, MD   calcium carbonate (TUMS - dosed in mg elemental calcium) chewable tablet 200 mg of elemental calcium, 1 tablet, Oral, 2 times per day, Lelon Perla, MD, 200 mg of elemental calcium at 10/30/19 1210   carvedilol (COREG) tablet 6.25 mg, 6.25 mg, Oral, BID, Lelon Perla, MD, 6.25 mg at 10/30/19 0856   ceFEPIme (MAXIPIME) 1 g in sodium chloride 0.9 % 100 mL IVPB, 1 g, Intravenous, Q24H, Crenshaw, Denice Bors, MD, Stopped at 10/29/19 2030   Chlorhexidine Gluconate Cloth 2 % PADS 6 each, 6 each, Topical, Q0600, Lelon Perla, MD, 6 each at 10/30/19 0534   ciprofloxacin (CIPRO) tablet 500 mg, 500 mg, Oral, q1800, [place] Callas, NP, 500 mg at 10/29/19 1700   doxercalciferol (HECTOROL) injection 1.5 mcg,  1.5 mcg, Intravenous, Q M,W,F-HD, Lelon Perla, MD, 1.5 mcg at 10/23/19 1142   feeding supplement (ENSURE ENLIVE) (ENSURE ENLIVE) liquid 237 mL, 237 mL, Oral, BID BM, Swayze, Ava, DO, 237 mL at 10/30/19 0857   fluticasone (FLONASE) 50 MCG/ACT nasal spray 1 spray, 1 spray, Each Nare, Daily PRN, Lelon Perla, MD   gabapentin (NEURONTIN) capsule 100 mg, 100 mg, Oral, QHS, Crenshaw, Denice Bors, MD, 100 mg at 10/29/19 2114   heparin ADULT infusion 100 units/mL (25000 units/225mL sodium chloride 0.45%), 1,300 Units/hr, Intravenous, Continuous, Swayze, Ava, DO, Last Rate: 13 mL/hr at 10/29/19 2343, 1,300 Units/hr at 10/29/19 2343   hydrALAZINE (APRESOLINE) injection 10 mg, 10 mg, Intravenous, Q6H PRN, Stanford Breed, Denice Bors, MD   insulin aspart (novoLOG) injection 0-5 Units, 0-5 Units, Subcutaneous, QHS, Lelon Perla, MD, 4 Units at 10/25/19 2207   insulin aspart (novoLOG) injection 0-9 Units, 0-9 Units, Subcutaneous, TID WC, Lelon Perla, MD, 3 Units at 10/30/19 1210   isosorbide mononitrate (IMDUR) 24 hr tablet 30 mg, 30 mg, Oral, QPC lunch, Lelon Perla, MD, 30 mg at 10/30/19 1210   multivitamin (RENA-VIT) tablet 1 tablet, 1 tablet, Oral, QPC lunch, Lelon Perla, MD, 1 tablet at 10/29/19 2114   ondansetron [facility]) injection 4 mg, 4 mg, Intravenous, Q6H PRN, Lelon Perla, MD, 4 mg at 10/24/19 [id number]   oxyCODONE (Oxy IR/ROXICODONE) immediate release tablet 5 mg, 5 mg, Oral, Q6H PRN, Lelon Perla, MD, 5 mg at 10/30/19 0856   senna-docusate (Senokot-S) tablet 1  tablet, 1 tablet, Oral, QHS PRN, Stanford Breed, Denice Bors, MD   warfarin (COUMADIN) tablet 2.5 mg, 2.5 mg, Oral, ONCE-1800, Bertis Ruddy, RPH   Warfarin - Pharmacist Dosing Inpatient, , Does not apply, q1800, Swayze, Ava, DO  Facility-Administered Medications Ordered in Other Encounters:    0.9 %  sodium chloride infusion, , Intravenous, Continuous, Monia Sabal, [place]   Physical Exam:   Vitals:    10/30/19 0801 10/30/19 1148  BP: (!) 159/83 134/77  Pulse: 82 78  Resp: 15 16  Temp: 98.5 F (36.9 C) 98.2 F (36.8 C)  SpO2: 100% 98%   Physical Exam Lines: RT Hayden permacath, PIV Gen: pleasant, chronically ill, NAD, A&Ox 3 Head: NCAT, no temporal wasting evident EENT: PERRL, EOMI, MMM, adequate dentition Neck: supple, no JVD CV: NRRR, I/VI SEM at LLSB Pulm: CTA bilaterally, no wheeze or retractions Abd: soft, NTND, +BS Extrems: 1+ non-pitting LE edema, 1+ pulses Skin: no rashes, adequate skin turgor Neuro: CN II-XII grossly intact, slight LT sided weakness noted w/o facial droop, gait was not assessed, A&Ox 3   Review of Systems:  Review of Systems  Constitutional: Positive for fever and malaise/fatigue. Negative for chills and weight loss.  HENT: Negative for congestion, hearing loss, sinus pain and sore throat.   Eyes: Negative for blurred vision, photophobia and discharge.  Respiratory: Negative for cough, hemoptysis and shortness of breath.   Cardiovascular: Negative for chest pain, palpitations, orthopnea and leg swelling.  Gastrointestinal: Negative for abdominal pain, constipation, diarrhea, heartburn, nausea and vomiting.  Genitourinary: Negative for dysuria, flank pain, frequency and urgency.  Musculoskeletal: Positive for back pain. Negative for joint pain and myalgias.  Skin: Negative for itching and rash.  Neurological: Positive for weakness. Negative for tremors, seizures and headaches.  Endo/Heme/Allergies: Negative for polydipsia. Does not bruise/bleed easily.  Psychiatric/Behavioral: Negative for depression and substance abuse. The patient is not nervous/anxious and does not have insomnia.      Lab Results  Component Value Date   WBC 13.2 (H) 10/30/2019   HGB 8.3 (L) 10/30/2019   HCT 26.2 (L) 10/30/2019   MCV 91.3 10/30/2019   PLT 205 10/30/2019    Lab Results  Component Value Date   CREATININE 5.81 (H) 10/30/2019   BUN 32 (H) 10/30/2019   NA 135  10/30/2019   K 4.5 10/30/2019   CL 97 (L) 10/30/2019   CO2 25 10/30/2019    Lab Results  Component Value Date   ALT 16 10/20/2019   AST 12 (L) 10/20/2019   ALKPHOS 75 10/20/2019     Microbiology: Recent Results (from the past 240 hour(s))  Culture, blood (routine x 2)     Status: Abnormal   Collection Time: 10/22/19  7:35 AM   Specimen: BLOOD  Result Value Ref Range Status   Specimen Description BLOOD RIGHT ANTECUBITAL  Final   Special Requests   Final    BOTTLES DRAWN AEROBIC ONLY Blood Culture adequate volume   Culture  Setup Time   Final    GRAM NEGATIVE RODS AEROBIC BOTTLE ONLY CRITICAL VALUE NOTED.  VALUE IS CONSISTENT WITH PREVIOUSLY REPORTED AND CALLED VALUE.    Culture (A)  Final    PSEUDOMONAS AERUGINOSA SUSCEPTIBILITIES PERFORMED ON PREVIOUS CULTURE WITHIN THE LAST 5 DAYS. Performed at [facility], [address] [place], [place] [address]    Report Status 10/25/2019 FINAL  Final  Culture, blood (routine x 2)     Status: Abnormal   Collection Time: 10/22/19  7:43 AM  Specimen: BLOOD LEFT HAND  Result Value Ref Range Status   Specimen Description BLOOD LEFT HAND  Final   Special Requests   Final    BOTTLES DRAWN AEROBIC ONLY Blood Culture adequate volume   Culture  Setup Time   Final    GRAM NEGATIVE RODS AEROBIC BOTTLE ONLY CRITICAL RESULT CALLED TO, READ BACK BY AND VERIFIED WITH: PHARMD V [place] [id number] AT 725 AM BY CM Performed at [facility], [place] [address], [place], [place] [address]    Culture PSEUDOMONAS AERUGINOSA (A)  Final   Report Status 10/25/2019 FINAL  Final   Organism ID, Bacteria PSEUDOMONAS AERUGINOSA  Final      Susceptibility   Pseudomonas aeruginosa - MIC*    CEFTAZIDIME 2 SENSITIVE Sensitive     CIPROFLOXACIN <=0.25 SENSITIVE Sensitive     GENTAMICIN <=1 SENSITIVE Sensitive     IMIPENEM 1 SENSITIVE Sensitive     PIP/TAZO <=4 SENSITIVE Sensitive     CEFEPIME <=1 SENSITIVE Sensitive     * PSEUDOMONAS AERUGINOSA  Blood  Culture ID Panel (Reflexed)     Status: Abnormal   Collection Time: 10/22/19  7:43 AM  Result Value Ref Range Status   Enterococcus species NOT DETECTED NOT DETECTED Final   Listeria monocytogenes NOT DETECTED NOT DETECTED Final   Staphylococcus species NOT DETECTED NOT DETECTED Final   Staphylococcus aureus (BCID) NOT DETECTED NOT DETECTED Final   Streptococcus species NOT DETECTED NOT DETECTED Final   Streptococcus agalactiae NOT DETECTED NOT DETECTED Final   Streptococcus pneumoniae NOT DETECTED NOT DETECTED Final   Streptococcus pyogenes NOT DETECTED NOT DETECTED Final   Acinetobacter baumannii NOT DETECTED NOT DETECTED Final   Enterobacteriaceae species NOT DETECTED NOT DETECTED Final   Enterobacter cloacae complex NOT DETECTED NOT DETECTED Final   Escherichia coli NOT DETECTED NOT DETECTED Final   Klebsiella oxytoca NOT DETECTED NOT DETECTED Final   Klebsiella pneumoniae NOT DETECTED NOT DETECTED Final   Proteus species NOT DETECTED NOT DETECTED Final   Serratia marcescens NOT DETECTED NOT DETECTED Final   Carbapenem resistance NOT DETECTED NOT DETECTED Final   Haemophilus influenzae NOT DETECTED NOT DETECTED Final   Neisseria meningitidis NOT DETECTED NOT DETECTED Final   Pseudomonas aeruginosa DETECTED (A) NOT DETECTED Final    Comment: CRITICAL RESULT CALLED TO, READ BACK BY AND VERIFIED WITH: PHARMD V BRYK [id number] AT 725 AM BY CM    Candida albicans NOT DETECTED NOT DETECTED Final   Candida glabrata NOT DETECTED NOT DETECTED Final   Candida krusei NOT DETECTED NOT DETECTED Final   Candida parapsilosis NOT DETECTED NOT DETECTED Final   Candida tropicalis NOT DETECTED NOT DETECTED Final    Comment: Performed at [facility], [place] [address] [place], [place] [address]  Culture, blood (routine x 2)     Status: Abnormal   Collection Time: 10/22/19  5:03 PM   Specimen: BLOOD LEFT HAND  Result Value Ref Range Status   Specimen Description BLOOD LEFT HAND  Final   Special  Requests   Final    BOTTLES DRAWN AEROBIC AND ANAEROBIC Blood Culture adequate volume   Culture  Setup Time   Final    AEROBIC BOTTLE ONLY GRAM NEGATIVE RODS CRITICAL VALUE NOTED.  VALUE IS CONSISTENT WITH PREVIOUSLY REPORTED AND CALLED VALUE.    Culture (A)  Final    PSEUDOMONAS AERUGINOSA SUSCEPTIBILITIES PERFORMED ON PREVIOUS CULTURE WITHIN THE LAST 5 DAYS. Performed at [facility], [place] [address], [place], [place]  [id number]    Report Status 10/25/2019 FINAL  Final  Culture, blood (routine x 2)     Status: Abnormal   Collection Time: 10/22/19  5:08 PM   Specimen: BLOOD LEFT ARM  Result Value Ref Range Status   Specimen Description BLOOD LEFT ARM  Final   Special Requests   Final    BOTTLES DRAWN AEROBIC ONLY Blood Culture results may not be optimal due to an inadequate volume of blood received in culture bottles   Culture  Setup Time   Final    AEROBIC BOTTLE ONLY GRAM NEGATIVE RODS CRITICAL VALUE NOTED.  VALUE IS CONSISTENT WITH PREVIOUSLY REPORTED AND CALLED VALUE.    Culture (A)  Final    PSEUDOMONAS AERUGINOSA SUSCEPTIBILITIES PERFORMED ON PREVIOUS CULTURE WITHIN THE LAST 5 DAYS. Performed at [facility], [place] [address] [place], [place] [address]    Report Status 10/25/2019 FINAL  Final  Culture, blood (routine x 2)     Status: None   Collection Time: 10/24/19  1:18 PM   Specimen: BLOOD LEFT HAND  Result Value Ref Range Status   Specimen Description BLOOD LEFT HAND  Final   Special Requests   Final    BOTTLES DRAWN AEROBIC ONLY Blood Culture adequate volume   Culture   Final    NO GROWTH 5 DAYS Performed at [facility], [place] [address] [place], [place] [address]    Report Status 10/29/2019 FINAL  Final  Culture, blood (routine x 2)     Status: None   Collection Time: 10/24/19  1:28 PM   Specimen: BLOOD RIGHT HAND  Result Value Ref Range Status   Specimen Description BLOOD RIGHT HAND  Final   Special Requests   Final    BOTTLES DRAWN AEROBIC  AND ANAEROBIC Blood Culture results may not be optimal due to an excessive volume of blood received in culture bottles   Culture   Final    NO GROWTH 5 DAYS Performed at [facility], [place] [address] [place], [place] [address]    Report Status 10/29/2019 FINAL  Final    Impression/Plan: The patient is a 68 year old African-American female diabetic with end-stage renal disease on hemodialysis and a history of relapsing pseudomonal infection beginning with a vascular graft infection in June 2020, status post debridement and ligation who was admitted with new onset left-sided weakness subsequently found to have new mitral valve endocarditis with pseudomonal bacteremia and multiple new CVAs including to basal ganglia and frontal lobes.  1.  Mitral valve endocarditis -this is quite a rare condition as Pseudomonas is rarely found to be the cause of heart valve infections.  The patient underwent debridement and ligation to a right upper arm AV graft in June 2020 with documented Pseudomonas from that wound.  She received only Bactrim postoperatively which would not have treated Pseudomonas.  She was admitted to the hospital in late August with a confirmed pseudomonal bacteremia and received 6 weeks of IV Fortaz at dialysis.  Several weeks after completion of treatment, she was admitted for another episode of pseudomonal bacteremia now with basal ganglia and frontal lobe CVAs concerning for embolic events.  A TEE last week confirmed mitral valve vegetation and endocarditis.  Her most recent fever was likely due to a new frontal lobe CVA.  A tagged white blood cell scan last Friday failed to show any remnant source of infection, so no further surgical intervention should be needed at this time.  Given the patient's  recurrence, will treat with dual antibiotics with cefepime dosed daily for now and oral cipro given as an adjunct to ensure consistent bioavailability of an antibiotic for the duration of her  treatment.  Patient was assessed by CT surgery last week but felt to be a poor surgical candidate so medical management has been recommended for completion of treatment.  The tentative DC date for her antibiotics is December 06, 2019.  As her hospital or rehab discharge nears, her cefepime may be transitioned as noted below to avoid placement of an invasive line.  2. ESRD -as the patient's dialysis day is currently unsettled, will continue cefepime dosing at 1 g IV daily given after dialysis on dialysis days.  Patient normally dialyzes every Monday Wednesday and Friday, but she is currently not on the schedule here.  Her Cipro dosing will be 500 mg p.o. daily.  But at the time of her discharge from rehab, her cefepime dosing may be transitioned to 2 g IV q. Monday and Wednesday and 3 g IV q. Friday following dialysis sessions.  Until that time, I would continue cefepime as traditionally dosed at 1 g IV daily (given after hemodialysis when concurrent with hemodialysis days).  The patient's previous permacath was removed on this admission, she was given a line free period of 3 days, and a new right subclavian permacath was placed last week for resumption of hemodialysis.  3. CVA/CNS emboli -patient was confirmed to have acute right basal ganglia infarcts on her initial MRI here.  Repeat imaging last Friday confirmed a new frontal lobe infarct and her TEE showed mitral valve vegetation, confirming endocarditis.  Continue antibiotics as noted above I would remain vigilant for further embolic events given the position of her infected valve.

## 2019-10-30 NOTE — Progress Notes (Signed)
Physical Therapy Treatment Patient Details Name: [name] MRN: [id number] DOB: Nov 01, 1951 Today's Date: 10/30/2019    History of Present Illness [name] is a 68 y.o. female with history of ESRD ( on dialysis M-W-F), TIA 2010, HTN, HLD, Ha, DM2 who presented to  [facility] with c/o dizziness, left arm weakness and left leg numbness. MRI revealed acute R basal ganglia infarct. In addition, she has R IJ DVT and spiked a fever 11/27 with concern for sepsis.     PT Comments    Pt supine in bed on arrival.  She is slow to process but able to follow commands for tasks during PT tx.  Pt is improving slowly and tolerating more activity.  She was able to ambulate 8 ft with max assistance +1.  Based on her functional gains will update recommendations to CIR at this time.   She has 24 hr assistance and a family member has moved from out of state for assist in her care when she returns home.  Pt would be an excellent candidate for aggressive CIR therapies to improve strength and function to decrease care giver burden before returning home. Will inform supervising PT of need for change in recommendations at this time.   Follow Up Recommendations  CIR;Supervision/Assistance - 24 hour     Equipment Recommendations  None recommended by PT    Recommendations for Other Services Rehab consult     Precautions / Restrictions Precautions Precautions: Fall Required Braces or Orthoses: Spinal Brace Spinal Brace: Thoracolumbosacral orthotic;Other (comment) Restrictions Weight Bearing Restrictions: No    Mobility  Bed Mobility Overal bed mobility: Needs Assistance Bed Mobility: Rolling;Sidelying to Sit;Sit to Supine Rolling: Min assist Sidelying to sit: Mod assist   Sit to supine: Mod assist   General bed mobility comments: Pt required max cuing for hand placement and technique, min assistance to roll and moderate assistance to elevate trunk into a seated position.  She  presents with imbalance sitting edge of bed and required constant cueing for weight shifting and scooting forward to allow for support from B LEs.  To return to be she required moderate assistance to lift B LEs back to bed against gravity.  Once in bed +2 max to boost to [facility] due tot fatigue.  Transfers Overall transfer level: Needs assistance Equipment used: Rolling walker (2 wheeled) Transfers: Sit to/from Stand Sit to Stand: Mod assist         General transfer comment: Cues for hand placement to and from seated surface.  Pt wearing TLSO for comfort during functional transfers.  PT attempts to pull on RW to achieve standing and sit impulsively with poor eccentric load when fatigued.  Ambulation/Gait Ambulation/Gait assistance: Max assist Gait Distance (Feet): 8 Feet Assistive device: Rolling walker (2 wheeled) Gait Pattern/deviations: Step-to pattern;Shuffle;Trunk flexed;Narrow base of support;Decreased stride length Gait velocity: decreased   General Gait Details: Pt with decreased B quad strength and slow buckling noted.  Max assistance to support with heavy reliance on B UEs.  Pt required cues for upper trunk control and B knee extension.  Close chair follow used for safety. When patient is ready to sit it happens quick.   Stairs             Wheelchair Mobility    Modified Rankin (Stroke Patients Only) Modified Rankin (Stroke Patients Only) Pre-Morbid Rankin Score: No symptoms Modified Rankin: Moderately severe disability     Balance Overall balance assessment: Needs assistance Sitting-balance support: Feet supported;Bilateral upper extremity  supported Sitting balance-Leahy Scale: Poor Sitting balance - Comments: B Lateral LOB required B UE support and constant cueing to maintain midline.  Noted posterior lean as well.  Cues to weight shift forward to place weight in B feet. Postural control: Posterior lean;Left lateral lean;Right lateral lean   Standing  balance-Leahy Scale: Poor Standing balance comment: B buckling with max assistance to maintain standing and heavy UE assistance. Pt sits impulsively when fatigued.                            Cognition Arousal/Alertness: Awake/alert Behavior During Therapy: WFL for tasks assessed/performed Overall Cognitive Status: Within Functional Limits for tasks assessed                                 General Comments: Pt is a little slow to process but able to answer questions and follow commands during session.  Cognition not formally assessed.      Exercises      General Comments        Pertinent Vitals/Pain Pain Assessment: 0-10 Pain Score: 6  Pain Location: lower back Pain Descriptors / Indicators: Grimacing;Moaning;Aching Pain Intervention(s): Repositioned    Home Living                      Prior Function            PT Goals (current goals can now be found in the care plan section) Acute Rehab PT Goals Patient Stated Goal: to move better Potential to Achieve Goals: Fair Progress towards PT goals: Progressing toward goals    Frequency    Min 4X/week      PT Plan Discharge plan needs to be updated    Co-evaluation              AM-PAC PT "6 Clicks" Mobility   Outcome Measure  Help needed turning from your back to your side while in a flat bed without using bedrails?: A Lot Help needed moving from lying on your back to sitting on the side of a flat bed without using bedrails?: A Lot Help needed moving to and from a bed to a chair (including a wheelchair)?: A Lot Help needed standing up from a chair using your arms (e.g., wheelchair or bedside chair)?: A Lot Help needed to walk in hospital room?: Total Help needed climbing 3-5 steps with a railing? : Total 6 Click Score: 10    End of Session Equipment Utilized During Treatment: Gait belt;Back brace Activity Tolerance: Patient limited by pain Patient left: in bed(with  transport tech to leave for HD.) Nurse Communication: Mobility status PT Visit Diagnosis: Muscle weakness (generalized) (M62.81);Other abnormalities of gait and mobility (R26.89);Pain Pain - Right/Left: Left Pain - part of body: Hip     Time: [id number] PT Time Calculation (min) (ACUTE ONLY): 19 min  Charges:  $Gait Training: 8-22 mins                     Erasmo Leventhal , PTA Acute Rehabilitation Services Pager [phone] Office [phone]     Raeya Merritts Eli Hose 10/30/2019, 4:41 PM

## 2019-10-30 NOTE — Progress Notes (Signed)
Patient ID: [name], female   DOB: 10-Jul-1951, 68 y.o.   MRN: [id number]  [facility] Progress Note   Assessment/ Plan:   1.  Recurrent Pseudomonas bacteremia (suspected HD CRB) with evidence of mitral valve endocarditis prompting exchange of hemodialysis catheter awaiting repeat blood cultures and recommendations by ID.  No surgical indications for repair/replacement at this time; medical management amended with reevaluation post completion of antibiotics for bacteremia. 2. ESRD: She will undertake hemodialysis today to return her back to her usual outpatient dialysis schedule of Monday/Wednesday/Friday anticipating she will continue that upon discharge to her skilled nursing facility/OP HD. 3. Anemia: Without overt blood loss, continue to monitor on ESA 4. CKD-MBD: Phosphorus level within acceptable range, monitor on renal diet/binders 5.  Acute CVA with evidence of chronic ischemia/old infarcts: Ongoing management per neurology, evidence of mitral valve endocarditis. 6. Hypertension: Improving blood pressure noted status post recent CVA.  Subjective:   Reports to be feeling well, inquires about disposition/dialysis.   Objective:   BP (!) 159/83 (BP Location: Right Arm)   Pulse 82   Temp 98.5 F (36.9 C) (Oral)   Resp 15   Ht 5\' 7"  (1.702 m)   Wt 63.9 kg   SpO2 100%   BMI 22.06 kg/m   Physical Exam: Gen: Resting comfortably in bed, oriented to time, person and place CVS: Regular rhythm, normal rate, S1 and S2 normal Resp: Diminished breath sounds over bases, no audible rales or rhonchi Abd: Soft, flat, nontender Ext: No lower extremity edema.  Right IJ [facility]  Labs: BMET Recent Labs  Lab 10/24/19 0419 10/25/19 0314 10/26/19 0353 10/27/19 0255 10/28/19 0314 10/29/19 0319 10/30/19 0438  NA 136 137 137 134* 136 138 135  K 4.6 4.8 4.9 3.6 4.1 4.2 4.5  CL 98 98 97* 95* 98 100 97*  CO2 24 21* 23 24 25 27 25   GLUCOSE 104* 113* 111* 128* 146* 108* 175*   BUN 40* 53* 68* 20 38* 12 32*  CREATININE 7.33* 9.14* 10.57* 4.76* 6.70* 3.67* 5.81*  CALCIUM 8.2* 8.4* 8.1* 8.0* 8.4* 8.6* 8.5*  PHOS 4.9* 5.5* 6.4* 3.6 5.0* 3.6 4.7*   CBC Recent Labs  Lab 10/27/19 0255 10/28/19 0314 10/29/19 0319 10/30/19 0438  WBC 11.6* 12.5* 14.0* 13.2*  HGB 8.2* 7.0* 6.7* 8.3*  HCT 25.6* 21.9* 21.6* 26.2*  MCV 88.6 89.4 92.7 91.3  PLT 200 201 201 205    Medications:    . sodium chloride   Intravenous Once  . acetaminophen  1,000 mg Oral Once  . calcium carbonate  1 tablet Oral 2 times per day  . carvedilol  6.25 mg Oral BID  . Chlorhexidine Gluconate Cloth  6 each Topical Q0600  . ciprofloxacin  500 mg Oral q1800  . doxercalciferol  1.5 mcg Intravenous Q M,W,F-HD  . feeding supplement (ENSURE ENLIVE)  237 mL Oral BID BM  . gabapentin  100 mg Oral QHS  . insulin aspart  0-5 Units Subcutaneous QHS  . insulin aspart  0-9 Units Subcutaneous TID WC  . isosorbide mononitrate  30 mg Oral QPC lunch  . multivitamin  1 tablet Oral QPC lunch  . Warfarin - Pharmacist Dosing Inpatient   Does not apply [id number]   Elmarie Shiley, MD 10/30/2019, 8:28 AM

## 2019-10-30 NOTE — Progress Notes (Signed)
ANTICOAGULATION CONSULT NOTE - Follow-Up  Pharmacy Consult for Transitioning From Heparin to Warfarin Indication: RIJ DVT  No Known Allergies  Patient Measurements: Height: 5\' 7"  (170.2 cm) Weight: 140 lb 14 oz (63.9 kg) IBW/kg (Calculated) : 61.6 Heparin Dosing Weight: 63 kg  Vital Signs: Temp: 98.5 F (36.9 C) (12/07 0801) Temp Source: Oral (12/07 0801) BP: 159/83 (12/07 0801) Pulse Rate: 82 (12/07 0801)  Labs: Recent Labs    10/28/19 0314  10/29/19 0319 10/29/19 1702 10/30/19 0438  HGB 7.0*  --  6.7*  --  8.3*  HCT 21.9*  --  21.6*  --  26.2*  PLT 201  --  201  --  205  LABPROT 15.0  --  18.6*  --  24.8*  INR 1.2  --  1.6*  --  2.3*  HEPARINUNFRC <0.10*   < > 0.26* 0.44 0.34  CREATININE 6.70*  --  3.67*  --  5.81*   < > = values in this interval not displayed.    Estimated Creatinine Clearance: 8.9 mL/min (A) (by C-G formula based on SCr of 5.81 mg/dL (H)).  Assessment: 68 yr old female with ESRD who presented on 11/27 with new CVA and is now found to have RIJ DVT and pharmacy consulted to start heparin anticoagulation - low goal and no bolus with concurrent CVA. Pt rec'd new tunneled HD catheter on 12/3 for continuation of HD.  Warfarin started 12/4, also on cipro  Heparin level therapeutic s/p rate change x2, INR now therapeutic at 2.3 three days after start.  H/H improved, plts 205.  Goal of Therapy:  Heparin level 0.3-0.5 units/ml  INR 2.0-3.0 Monitor platelets by anticoagulation protocol: Yes   Plan:  Continue heparin gtt at 1300 units/hr Warfarin 2.5mg  PO x1 Daily INR, heparin level, s/s bleeding  Bertis Ruddy, PharmD Clinical Pharmacist Please check AMION for all [place] numbers 10/30/2019 8:27 AM

## 2019-10-31 LAB — GLUCOSE, CAPILLARY
Glucose-Capillary: 146 mg/dL — ABNORMAL HIGH (ref 70–99)
Glucose-Capillary: 189 mg/dL — ABNORMAL HIGH (ref 70–99)
Glucose-Capillary: 182 mg/dL — ABNORMAL HIGH (ref 70–99)
Glucose-Capillary: 170 mg/dL — ABNORMAL HIGH (ref 70–99)

## 2019-10-31 LAB — CBC
HCT: 25.6 % — ABNORMAL LOW (ref 36.0–46.0)
Hemoglobin: 8.1 g/dL — ABNORMAL LOW (ref 12.0–15.0)
MCH: 29 pg (ref 26.0–34.0)
MCHC: 31.6 g/dL (ref 30.0–36.0)
MCV: 91.8 fL (ref 80.0–100.0)
Platelets: 197 10*3/uL (ref 150–400)
RBC: 2.79 MIL/uL — ABNORMAL LOW (ref 3.87–5.11)
RDW: 15.7 % — ABNORMAL HIGH (ref 11.5–15.5)
WBC: 10.9 10*3/uL — ABNORMAL HIGH (ref 4.0–10.5)
nRBC: 0 % (ref 0.0–0.2)

## 2019-10-31 LAB — RENAL FUNCTION PANEL
Albumin: 1.6 g/dL — ABNORMAL LOW (ref 3.5–5.0)
Anion gap: 12 (ref 5–15)
BUN: 13 mg/dL (ref 8–23)
CO2: 26 mmol/L (ref 22–32)
Calcium: 8.4 mg/dL — ABNORMAL LOW (ref 8.9–10.3)
Chloride: 95 mmol/L — ABNORMAL LOW (ref 98–111)
Creatinine, Ser: 3.41 mg/dL — ABNORMAL HIGH (ref 0.44–1.00)
GFR calc Af Amer: 15 mL/min — ABNORMAL LOW (ref >60)
GFR calc non Af Amer: 13 mL/min — ABNORMAL LOW (ref >60)
Glucose, Bld: 166 mg/dL — ABNORMAL HIGH (ref 70–99)
Phosphorus: 3.3 mg/dL (ref 2.5–4.6)
Potassium: 3.8 mmol/L (ref 3.5–5.1)
Sodium: 133 mmol/L — ABNORMAL LOW (ref 135–145)

## 2019-10-31 LAB — HEPARIN LEVEL (UNFRACTIONATED): Heparin Unfractionated: 0.68 IU/mL (ref 0.30–0.70)

## 2019-10-31 LAB — PROTIME-INR
INR: 2.8 — ABNORMAL HIGH (ref 0.8–1.2)
Prothrombin Time: 29.3 seconds — ABNORMAL HIGH (ref 11.4–15.2)

## 2019-10-31 MED ORDER — WARFARIN 0.5 MG HALF TABLET
0.5000 mg | ORAL_TABLET | Freq: Once | ORAL | Status: AC
  Administered 2019-10-31: 0.5 mg via ORAL
  Filled 2019-10-31 (×2): qty 1

## 2019-10-31 MED ORDER — DOCUSATE SODIUM 100 MG PO CAPS
200.0000 mg | ORAL_CAPSULE | Freq: Once | ORAL | Status: AC
  Administered 2019-10-31: 200 mg via ORAL
  Filled 2019-10-31: qty 2

## 2019-10-31 NOTE — Progress Notes (Signed)
ANTICOAGULATION CONSULT NOTE - Follow-Up  Pharmacy Consult for Transitioning From Heparin to Warfarin Indication: RIJ DVT  No Known Allergies  Patient Measurements: Height: 5\' 7"  (170.2 cm) Weight: 135 lb 12.9 oz (61.6 kg) IBW/kg (Calculated) : 61.6 Heparin Dosing Weight: 63 kg  Vital Signs: Temp: 98.6 F (37 C) (12/08 0350) Temp Source: Oral (12/08 0350) BP: 135/75 (12/08 0750) Pulse Rate: 79 (12/08 0750)  Labs: Recent Labs    10/29/19 0319 10/29/19 1702 10/30/19 0438 10/31/19 0358  HGB 6.7*  --  8.3* 8.1*  HCT 21.6*  --  26.2* 25.6*  PLT 201  --  205 197  LABPROT 18.6*  --  24.8* 29.3*  INR 1.6*  --  2.3* 2.8*  HEPARINUNFRC 0.26* 0.44 0.34 0.68  CREATININE 3.67*  --  5.81* 3.41*    Estimated Creatinine Clearance: 15.1 mL/min (A) (by C-G formula based on SCr of 3.41 mg/dL (H)).  Assessment: 68 yr old female with ESRD who presented on 11/27 with new CVA and is now found to have RIJ DVT and pharmacy consulted to start heparin anticoagulation - low goal and no bolus with concurrent CVA. Pt rec'd new tunneled HD catheter on 12/3 for continuation of HD.  Warfarin started 12/4, also on cipro (day #6)  Heparin level elevated above goal at 0.68 on 1300 units/hr -disccused with Dr. Benny Lennert and discontinuing today with INR therapeutic x2 days and overlap day 5.  INR therapeutic at 2.8 (day 5 of overlap and therapeutic x2 days)- large jump in INR today (by 0.5) likely being impacted by drug interaction with Cipro so will give very low dose of Warfarin this PM. H/H improved, plts 205>>197.  Goal of Therapy:  Heparin level 0.3-0.5 units/ml  INR 2.0-3.0 Monitor platelets by anticoagulation protocol: Yes   Plan:  Discontinue IV Heparin and levels.  Warfarin 0.5mg  po x1. Daily INR and monitor for bleeding.  Sloan Leiter, PharmD, BCPS, BCCCP Clinical Pharmacist Please refer to [facility] for [place] numbers 10/31/2019 8:37 AM

## 2019-10-31 NOTE — Progress Notes (Signed)
Inpatient Rehabilitation-Admissions Coordinator   Met with pt bedside. She remains interested in CIR program. Noted improved in participation and tolerance for therapies. I will begin insurance authorization process for possible admit to CIR.   Will update once there has been a determination.   Please call if questions.   Jhonnie Garner, OTR/L  Rehab Admissions Coordinator  [phone] 10/31/2019 9:45 AM

## 2019-10-31 NOTE — PMR Pre-admission (Signed)
PMR Admission Coordinator Pre-Admission Assessment  Patient: [name] is an 68 y.o., female MRN: [id number] DOB: 06/04/1951 Height: _0  (170.2 cm) Weight: 62.6 kg             Insurance Information HMO:     PPO: yes     PCP:      IPA:      80/20:      OTHER:  PRIMARY: [employer]      Policy#: [id number]      Subscriber: Patient CM Name: Mortimer Fries      Phone#: [phone]     Fax#: [phone] Pre-Cert#: [id number]      Employer:  Josem Kaufmann [phone] by Mortimer Fries for admit to CIR. Pt is approved for admit date of 12/9 with follow up clinicals due 12/16 and every week til DC. Follow up CM is Lestine Box (p): [phone] (f): [phone] Benefits:  Phone #: online     Name: [email or web address], transaction [id number] Eff. Date: 04/23/2018- still active     Deduct: does not have one      Out of Pocket Max: $6,700 ($6,700 met)      Life Max: NA CIR: $345/day co-pay for days 1-4, $0/day co-pay for days 5-90      SNF: $0/day copay for days 1-20, $178/day co-pay for days 21-100; limited to 100 days/cal yr Outpatient: limited by medical necessity     Co-Pay: $10-$40/visit co-pay, pending service provider Home Health: 100% coverage; limited by medical necessity      Co-Pay: 0% co-insurance DME: 80% coverage     Co-Pay: 20% co-insurance Providers:  SECONDARY: None      Policy#:       Subscriber:  CM Name:       Phone#:      Fax#:  Pre-Cert#:       Employer:  Benefits:  Phone #:      Name:  Eff. Date:      Deduct:       Out of Pocket Max:       Life Max: CIR:       SNF:  Outpatient:      Co-Pay:  Home Health:      Co-Pay:  DME:      Co-Pay:   Medicaid Application Date:       Case Manager:  Disability Application Date:       Case Worker:   The "Data Collection Information Summary" for patients in Inpatient Rehabilitation Facilities with attached "Privacy Act [place] Records" was provided and verbally reviewed with: Family  Emergency Contact  Information Contact Information    Name Relation Home Work [place] Sister [phone]     Lars Pinks Sister   [phone]   [place], [name] Son   [phone]     Current Medical History  Patient Admitting Diagnosis: Acute ischemic stroke + recurrent pseudomonas bacteremia with large mitral valve vegetation.   History of Present Illness:[name] is a 68 year old female with history of end-stage renal disease with hemodialysis Monday Wednesday Fridays, diet controlled diabetes mellitus, pseudomonal bacteremia and early discitis August 2020 completing a 6-week course of Tressie Ellis as well as history of infected AV graft, TIA 2010 maintained on aspirin and Plavix, hypertension, hyperlipidemia.  Per chart review she lives with her brother.  Independent prior to admission.  Presented 10/20/2019 with left-sided weakness and difficulty with ambulation.  Her last dialysis was 10/18/2019.  CT of the brain showed acute right basal ganglia infarction.  Patient did  not receive TPA.  Admission chemistries with creatinine 7.76, BUN 56, WBC 15,700, hemoglobin 9.7, SARS coronavirus negative.  MRI/MRA showed acute right basal ganglia infarction chronic ischemia with multiple old infarctions.  Negative head MRA.  Carotid Dopplers unremarkable.  Echocardiogram with ejection fraction of 65% without emboli or PFO.  Hospital course complicated by low-grade fever of 101 with findings of right IJ DVT on Dopplers.  Patient was placed on intravenous heparin and placed on Coumadin that she would remain on for approximately 3 months.  Infectious disease work-up during fever revealed Pseudomonas by BCID.  TEE performed on 10/26/2019 demonstrates large vegetation of mitral valve.  CTS consulted Dr. Darcey Nora felt the best course of action was continued IV antibiotics no current plan for mitral valve repair or replacement due to risk of recurrent infection at this time and current recommendations are to continue  antibiotics 6 weeks with cefepime as well as Cipro with end date to be established.  Latest follow-up blood culture showed no growth.  Hemodialysis remains ongoing as per renal services.  Right IJ hemodialysis catheter was removed 10/23/2019 with placement of new tunneled catheter 10/26/2019 for continued hemodialysis per renal services.  Noted on 10/27/2019 patient with altered mental status follow-up cranial CT scan completed that showed evidence of a new small frontal lobe white matter infarction new since MRI of 10/20/2019 near the left frontal horn.  No associated hemorrhage or mass-effect with follow-up neurology services recommending continued plan of care with ongoing Coumadin.  Patient did have some nonspecific back pain MRI of the left hip as well as lumbar spine no findings suspicious for osteomyelitis or septic arthritis.  There was some edema of the paraspinal and psoas muscles as well as osteodystrophy.  She was fitted with a TLSO back brace only for comfort.  Therapy evaluations have been completed and patient participating with therapies.  Patient is to admit to CIR on 11/01/2019.    Complete NIHSS TOTAL: 4 Glasgow Coma Scale Score: 15  Past Medical History  Past Medical History:  Diagnosis Date  . Arthritis    hands  . Constipation   . Diabetes mellitus without complication (HCC)    Type II  . ESRD (end stage renal disease) ([place])     M/W/F- Hemodialysis  . GERD (gastroesophageal reflux disease)   . Headache   . Hyperlipidemia   . Hypertension   . Irregular heart rate   . Stroke [facility])    TIA - approx 2010- no residual    Family History  family history includes Alcoholism in her father; Cirrhosis in her father; Diabetes in her mother; Heart failure in her mother; Heart murmur in her mother.  Prior Rehab/Hospitalizations:  Has the patient had prior rehab or hospitalizations prior to admission? Yes  Has the patient had major surgery during 100 days prior to admission?  Yes  Current Medications   Current Facility-Administered Medications:  .  0.9 %  sodium chloride infusion (Manually program via Guardrails IV Fluids), , Intravenous, Once, Blount, Xenia T, NP .  0.9 %  sodium chloride infusion, , Intravenous, Continuous, Crenshaw, Denice Bors, MD, Last Rate: 10 mL/hr at 10/31/19 2059 .  acetaminophen (TYLENOL) tablet 650 mg, 650 mg, Oral, Q4H PRN, 650 mg at 10/31/19 1022 **OR** acetaminophen (TYLENOL) 160 MG/5ML solution 650 mg, 650 mg, Per Tube, Q4H PRN **OR** acetaminophen (TYLENOL) suppository 650 mg, 650 mg, Rectal, Q4H PRN, Lelon Perla, MD .  acetaminophen (TYLENOL) tablet 1,000 mg, 1,000 mg, Oral, Once, Crenshaw, Brian S,  MD .  calcium carbonate (TUMS - dosed in mg elemental calcium) chewable tablet 200 mg of elemental calcium, 1 tablet, Oral, 2 times per day, Lelon Perla, MD, 200 mg of elemental calcium at 10/31/19 1836 .  carvedilol (COREG) tablet 6.25 mg, 6.25 mg, Oral, BID, Lelon Perla, MD, 6.25 mg at 10/31/19 2225 .  ceFEPIme (MAXIPIME) 1 g in sodium chloride 0.9 % 100 mL IVPB, 1 g, Intravenous, Q24H, Crenshaw, Denice Bors, MD, Stopped at 10/31/19 2139 .  Chlorhexidine Gluconate Cloth 2 % PADS 6 each, 6 each, Topical, Q0600, Lelon Perla, MD, 6 each at 11/01/19 0533 .  ciprofloxacin (CIPRO) tablet 500 mg, 500 mg, Oral, q1800, London Callas, NP, 500 mg at 10/31/19 1836 .  Darbepoetin Alfa (ARANESP) injection 100 mcg, 100 mcg, Intravenous, Q Wed-HD, Elmarie Shiley, MD .  doxercalciferol (HECTOROL) injection 1.5 mcg, 1.5 mcg, Intravenous, Q M,W,F-HD, Lelon Perla, MD, 1.5 mcg at 10/30/19 1837 .  feeding supplement (ENSURE ENLIVE) (ENSURE ENLIVE) liquid 237 mL, 237 mL, Oral, BID BM, Swayze, Ava, DO, 237 mL at 10/30/19 0857 .  fluticasone (FLONASE) 50 MCG/ACT nasal spray 1 spray, 1 spray, Each Nare, Daily PRN, Lelon Perla, MD .  gabapentin (NEURONTIN) capsule 100 mg, 100 mg, Oral, QHS, Crenshaw, Denice Bors, MD, 100 mg at 10/31/19  2225 .  heparin 1000 UNIT/ML injection, , , ,  .  heparin injection 2,500 Units, 40 Units/kg, Dialysis, PRN, Elmarie Shiley, MD .  heparin injection 2,600 Units, 40 Units/kg, Dialysis, PRN, Elmarie Shiley, MD .  hydrALAZINE (APRESOLINE) injection 10 mg, 10 mg, Intravenous, Q6H PRN, Lelon Perla, MD .  insulin aspart (novoLOG) injection 0-5 Units, 0-5 Units, Subcutaneous, QHS, Lelon Perla, MD, 2 Units at 10/30/19 2150 .  insulin aspart (novoLOG) injection 0-9 Units, 0-9 Units, Subcutaneous, TID WC, Lelon Perla, MD, 2 Units at 10/31/19 1836 .  isosorbide mononitrate (IMDUR) 24 hr tablet 30 mg, 30 mg, Oral, QPC lunch, Lelon Perla, MD, 30 mg at 10/31/19 1208 .  multivitamin (RENA-VIT) tablet 1 tablet, 1 tablet, Oral, QPC lunch, Lelon Perla, MD, 1 tablet at 10/31/19 2224 .  ondansetron (ZOFRAN) injection 4 mg, 4 mg, Intravenous, Q6H PRN, Lelon Perla, MD, 4 mg at 10/31/19 0220 .  oxyCODONE (Oxy IR/ROXICODONE) immediate release tablet 5 mg, 5 mg, Oral, Q6H PRN, Lelon Perla, MD, 5 mg at 10/31/19 2225 .  senna-docusate (Senokot-S) tablet 1 tablet, 1 tablet, Oral, QHS PRN, Lelon Perla, MD .  warfarin (COUMADIN) tablet 2.5 mg, 2.5 mg, Oral, q1800, Swayze, Ava, DO .  Warfarin - Pharmacist Dosing Inpatient, , Does not apply, q1800, Swayze, Ava, DO  Facility-Administered Medications Ordered in Other Encounters:  .  0.9 %  sodium chloride infusion, , Intravenous, Continuous, Monia Sabal, PA-C  Patients Current Diet:  Diet Order            Diet regular Room service appropriate? Yes; Fluid consistency: Thin  Diet effective now              Precautions / Restrictions Precautions Precautions: Fall Spinal Brace: Thoracolumbosacral orthotic, Other (comment) Spinal Brace Comments: for comfort with mobility. Restrictions Weight Bearing Restrictions: No   Has the patient had 2 or more falls or a fall with injury in the past year?No  Prior Activity  Level Community (5-7x/wk): retired (from [employer]), did drive PTA, Independent PTA.   Prior Functional Level Prior Function Level of Independence: Independent Comments: household and short distanced community  ambulator, drives  Self Care: Did the patient need help bathing, dressing, using the toilet or eating?  Independent  Indoor Mobility: Did the patient need assistance with walking from room to room (with or without device)? Independent  Stairs: Did the patient need assistance with internal or external stairs (with or without device)? Independent  Functional Cognition: Did the patient need help planning regular tasks such as shopping or remembering to take medications? Needed some help  Home Assistive Devices / [place] Devices/Equipment: CBG Meter Home Equipment: Kasandra Knudsen - single point  Prior Device Use: Indicate devices/aids used by the patient prior to current illness, exacerbation or injury? Walker  Current Functional Level Cognition  Overall Cognitive Status: Within Functional Limits for tasks assessed Orientation Level: Oriented X4 General Comments: Pt is a little slow to process but able to answer questions and follow commands during session.  Cognition not formally assessed. Attention: Sustained Sustained Attention: Impaired Sustained Attention Impairment: Verbal basic, Functional basic Memory: Impaired Memory Impairment: Decreased short term memory Decreased Short Term Memory: Verbal basic, Functional basic Problem Solving: Impaired Problem Solving Impairment: Functional basic Safety/Judgment: Impaired    Extremity Assessment (includes Sensation/Coordination)  Upper Extremity Assessment: Generalized weakness, RUE deficits/detail, LUE deficits/detail RUE Deficits / Details: grossly 3/5 LUE Deficits / Details: LUE weaker than RUE;grossly 2/5 LUE Coordination: decreased fine motor, decreased gross motor  Lower Extremity Assessment: Defer to PT  evaluation LLE Deficits / Details: hip flex 2/5, knee ext 2-/5, L knee buckling with attempted stand LLE Sensation: (unable to test today) LLE Coordination: decreased gross motor    ADLs  Overall ADL's : Needs assistance/impaired Eating/Feeding: Set up, Bed level Eating/Feeding Details (indicate cue type and reason): eating sherbert upon arrival Grooming: Minimal assistance, Sitting Grooming Details (indicate cue type and reason): bed level Upper Body Bathing: Maximal assistance Lower Body Bathing: Total assistance Upper Body Dressing : Maximal assistance Lower Body Dressing: Total assistance Toilet Transfer: Maximal assistance, +2 for physical assistance Toilet Transfer Details (indicate cue type and reason): pt unable to tolerate standing this session;poor balance sitting EOB Toileting- Clothing Manipulation and Hygiene: Total assistance General ADL Comments: deferred OOB mobility secondary to pt decreased balance sitting EOB and reports of increased lightheadedness;pt required maxA for sitting balance EOB    Mobility  Overal bed mobility: Needs Assistance Bed Mobility: Rolling, Sidelying to Sit Rolling: Min assist Sidelying to sit: Min assist Supine to sit: Mod assist, HOB elevated Sit to supine: Mod assist Sit to sidelying: Mod assist General bed mobility comments: Pt required assistance for rolling and trunk elevation, heavy use of railing but better ability to move into sitting.  Pt with heavy reliance on BUE support in sitting and total assistance to donn brace in sitting.    Transfers  Overall transfer level: Needs assistance Equipment used: Rolling walker (2 wheeled) Transfers: Sit to/from Stand Sit to Stand: Mod assist  Lateral/Scoot Transfers: Max assist, +2 physical assistance General transfer comment: Cues for hand placement to and from seated surface.  Bed placed in elevated height for improved ease, poor eccentric load returning to chair due to weakness.     Ambulation / Gait / Stairs / Wheelchair Mobility  Ambulation/Gait Ambulation/Gait assistance: Mod assist, +2 safety/equipment Gait Distance (Feet): 20 Feet Assistive device: Rolling walker (2 wheeled) Gait Pattern/deviations: Shuffle, Trunk flexed, Narrow base of support, Decreased stride length, Step-through pattern General Gait Details: Cues for upper trunk control and RW position.  Cues for B stride increase.  Close chair follow due to slow  buckling last session.  Good progression of gt distance. Gait velocity: decreased    Posture / Balance Dynamic Sitting Balance Sitting balance - Comments: Noted posterior lean as well.  Cues to weight shift forward to place weight in B feet.  Heavy B UE support in sitting. Balance Overall balance assessment: Needs assistance Sitting-balance support: Feet supported, Bilateral upper extremity supported Sitting balance-Leahy Scale: Poor Sitting balance - Comments: Noted posterior lean as well.  Cues to weight shift forward to place weight in B feet.  Heavy B UE support in sitting. Postural control: Left lateral lean Standing balance support: Bilateral upper extremity supported, During functional activity Standing balance-Leahy Scale: Poor Standing balance comment: Heavy reliance on RW in standing.    Special needs/care consideration BiPAP/CPAP: no CPM: no Continuous Drip IV: 1.9% sodium chloride infusion  Dialysis: yes        Days: MWF Life Vest: no Oxygen: no Special Bed: no Trach Size: no Wound Vac (area): no      Location: no Skin: catheter location to right chest                Bowel mgmt: last BM: 10/27/2019, continent Bladder mgmt: continent, does still make urine Diabetic mgmt: yes Behavioral consideration : no Chemo/radiation : no     Previous Home Environment (from acute therapy documentation) Living Arrangements: Other relatives Available Help at Discharge: Family Type of Home: House Home Layout: One level Home Access: Stairs  to enter Entrance Stairs-Rails: Right, Left, Can reach both Entrance Stairs-Number of Steps: 3 Home Care Services: No Additional Comments: pt states that she lives with her brother, chart states that she lives with her husband?  Discharge Living Setting Plans for Discharge Living Setting: House, Other (Comment)(Pt's son, [name], will move in (he can work from home). ) Type of Home at Discharge: House Discharge Home Layout: One level Discharge Home Access: Stairs to enter Entrance Stairs-Rails: Can reach both Entrance Stairs-Number of Steps: 3 Discharge Bathroom Shower/Tub: Tub/shower unit, Walk-in shower Discharge Bathroom Toilet: Standard Discharge Bathroom Accessibility: Yes How Accessible: Accessible via walker Does the patient have any problems obtaining your medications?: No  Social/Family/Support Systems Patient Roles: Other (Comment)(has family close by; sister helps organize meds.) Contact Information: son: [name]: [phone]; Anderson [place] (sister): [phone] Anticipated Caregiver: son + sister Anticipated Caregiver's Contact Information: see above Ability/Limitations of Caregiver: Min/Mod A Caregiver Availability: 24/7 Discharge Plan Discussed with Primary Caregiver: Yes(pt and son, [name]) Is Caregiver In Agreement with Plan?: Yes Does Caregiver/Family have Issues with Lodging/Transportation while Pt is in Rehab?: No   Goals/Additional Needs Patient/Family Goal for Rehab: PT/OT: Min A, SLP: NA Expected length of stay: 12-16 days Cultural Considerations: NA Dietary Needs: regular diet, thin liquids Equipment Needs: TBD Special Service Needs: HD M/W/F Pt/Family Agrees to Admission and willing to participate: Yes Program Orientation Provided & Reviewed with Pt/Caregiver Including Roles  & Responsibilities: Yes(pt and her son, [name])  Barriers to Discharge: Medical stability, Home environment access/layout, IV antibiotics, Hemodialysis  Barriers to Discharge Comments: has  great physical assist at DC. Will need to negotiate stairs, plan is for antibiotics til Jan 13th, this is a relaspsing infection.    Decrease burden of Care through IP rehab admission: NA   Possible need for SNF placement upon discharge: Not anticipated. Pt has great social support from her son and sister who plan to physically assist at DC. Pt is highly motivated and based on her Independent PLOF and current progression with therapies, anticipate pt will make measurable gains  in a reasonable time and return home safely.    Patient Condition: This patient's medical and functional status has changed since the consult dated: 10/23/2019 in which the Rehabilitation Physician determined and documented that the patient's condition is appropriate for intensive rehabilitative care in an inpatient rehabilitation facility. See "History of Present Illness" (above) for medical update. Functional changes are: improvement in functional transfers from Max A +2 for lateral scoot tranfers to Mod A transfers and initiation of gait of up to Mod A +2 20 feet with RW. Patient's medical and functional status update has been discussed with the Rehabilitation physician and patient remains appropriate for inpatient rehabilitation. Will admit to inpatient rehab today.  Preadmission Screen Completed By:  Raechel Ache, OT, 11/01/2019 10:12 AM ______________________________________________________________________   Discussed status with Dr. Ranell Patrick on 11/01/2019 at 10:12AM and received approval for admission today.  Admission Coordinator:  Raechel Ache, time 10:12AM/Date 11/01/2019.

## 2019-10-31 NOTE — Progress Notes (Signed)
Pt BP 178/87. Dr. Benny Lennert notified. Nurse will continue to monitor. [place]

## 2019-10-31 NOTE — Progress Notes (Signed)
PROGRESS NOTE  [name] [id number] DOB: [birth year] DOA: 10/20/2019 PCP: Manon Hilding, MD  Brief History   [name] a 68 y.o.femalewith medical history ofESRD (MWF), diabetes mellitus type 2, stroke, hypertension, hyperlipidemia presented with left-sided weakness,  when she woke up to get ready for dialysis at 4 AM on 10/20/2019, the patient noted left-sided weakness and had difficulty ambulating. Shw was found to have right basal ganglia infarcts on MRI. Her hospitalization complicated by fevers of 101, and finding of  right IJ DVT on Dopplers. She will require 3 months of anticoagulation for this. She is now found to have recurrent Pseudomonas bacteremia. Right IJ  HD catheter was removed on 10/23/2019. TEE performed on 10/26/2019 demonstrates large vegetation of mitral valve. CTS consulted. Recent history of stroke raises a possible barrier to near-term surgical intervention.  Permissive hypertension is in effect.  Pt had new tunneled catheter placed on 10/26/2019 for the continuation of HD. She went to HD last night. Following HD she had altered mental status. MRI was performed. It demonstrated chronic multiple small ischemic strokes and interval development of a left 4 mm cortical infarct.   [name] for CTS feels that the best course at this time is continuation of IV antibiotics. He does not feel that there is a currently a role for mitral valve repair/replacement due to risk for recurrent infection of a new bioprosthetic valve. She will need to have negative blood cultures for 3 weeks before surgery could be considered. She will also need improvement in her functional/nutritional status.  Dental Panorex could not be performed due to patient's ability to comply with exam. Tagged RBC scan negative on 10/27/2019.  She is awaiting insurance authorization for rehab.  Consultants   Infectious disease  Nephrology  Neurology  Cardiology  Cardiothoracic  surgery  Procedures   Dialysis  Placement of new HD Tunnelled catheter  Antibiotics   Anti-infectives (From admission, onward)   Start     Dose/Rate Route Frequency Ordered Stop   10/26/19 1800  ciprofloxacin (CIPRO) tablet 500 mg     500 mg Oral Daily-1800 10/26/19 0924     10/26/19 1541  ceFAZolin (ANCEF) 2-4 GM/100ML-% IVPB    Note to Pharmacy: [name]   : cabinet override      10/26/19 1541 10/26/19 1642   10/24/19 2000  ceFEPIme (MAXIPIME) 1 g in sodium chloride 0.9 % 100 mL IVPB     1 g 200 mL/hr over 30 Minutes Intravenous Every 24 hours 10/23/19 1100     10/23/19 1200  ceFEPIme (MAXIPIME) 2 g in sodium chloride 0.9 % 100 mL IVPB     2 g 200 mL/hr over 30 Minutes Intravenous Every Mon (Hemodialysis) 10/23/19 1044 10/23/19 1217   10/23/19 0745  ceFEPIme (MAXIPIME) 2 g in sodium chloride 0.9 % 100 mL IVPB  Status:  Discontinued     2 g 200 mL/hr over 30 Minutes Intravenous  Once 10/23/19 0736 10/23/19 1115     Subjective  The patient is resting comfortably. T max of 99 in the last 24 hours. No new complaints.  Objective   Vitals:  Vitals:   10/31/19 0750 10/31/19 1248  BP: 135/75 (!) 178/87  Pulse: 79 84  Resp: 13 18  Temp: 99 F (37.2 C) 98.8 F (37.1 C)  SpO2: 98% 100%   Exam:  Constitutional:   The patient is awake and alert and oriented x 3. No acute distress. Eyes:   EOMBI  PERRLA  Normal lids  and conjunctivae Respiratory:   No increased work of breathing.  No wheezes, rales, or rhonchi  No tactile fremitus Cardiovascular:   Regular rate and rhythm  No murmurs, ectopy, or gallups.  No lateral PMI. No thrills. Abdomen:   Abdomen is soft, non-tender, non-distended  No hernias, masses, or organomegaly  Normoactive bowel sounds.  Musculoskeletal:   No cyanosis, clubbing, or edema Skin:   No rashes, lesions, ulcers  palpation of skin: no induration or nodules Neurologic:   CN 2-12 intact  Sensation all 4 extremities  intact Psychiatric:   Mental status: Confused  I have personally reviewed the following:   Today's Data   Vitals, BMP, CBC, INR.   Micro Data   Blood cultures: 2/2 positive for pseudomonas aeruginosa  Surveillance cultures 2/2 (10/24/2019) Have had no growth.  Imaging   MRI left hip:  o No findings suspicious for osteomyelitis or septic arthritis o Edema of the paraspinal and psoas muscles o Osteodystrophy  MRI brain 10/26/2019: New left cortical infarct  Tagged WBC scan 10/27/2019  Scheduled Meds:  sodium chloride   Intravenous Once   acetaminophen  1,000 mg Oral Once   calcium carbonate  1 tablet Oral 2 times per day   carvedilol  6.25 mg Oral BID   Chlorhexidine Gluconate Cloth  6 each Topical Q0600   ciprofloxacin  500 mg Oral q1800   doxercalciferol  1.5 mcg Intravenous Q M,W,F-HD   feeding supplement (ENSURE ENLIVE)  237 mL Oral BID BM   gabapentin  100 mg Oral QHS   insulin aspart  0-5 Units Subcutaneous QHS   insulin aspart  0-9 Units Subcutaneous TID WC   isosorbide mononitrate  30 mg Oral QPC lunch   multivitamin  1 tablet Oral QPC lunch   warfarin  0.5 mg Oral ONCE-1800   Warfarin - Pharmacist Dosing Inpatient   Does not apply q1800   Continuous Infusions:  sodium chloride Stopped (10/20/19 1317)   ceFEPime (MAXIPIME) IV 1 g (10/30/19 2040)    Active Problems:   ESRD needing dialysis (HCC)   Anemia of chronic renal failure   Type 2 diabetes mellitus with diabetic nephropathy (HCC)   Bacteremia due to Pseudomonas   Acute ischemic stroke ([place])   Acute left-sided weakness   Acute bacterial endocarditis   LOS: 11 days   A & P  Acute ischemic stroke: quite possibly due to septic emboli from large mitral valve vegetation.  MRI notes right basal ganglia infarcts, continues to have significant left-sided deficits. TEE performed today demonstrates a large vegetation on the mitral valve with possible destruction of the valve. Carotid  duplex is unremarkable, no PFO noted on TCD bubble study. Patient was on aspirin and Plavix at baseline which was continued however given concomitant new finding of acute DVT in her right IJ she was started on IV heparin without bolus, after discussion with neurology [name]. Aspirin and Plavix discontinued 11/29. Hemoglobin A1c is 7.6, LDL is 65. I appreciate the assistance of neurology. CIR has been recommended and they have been consulted. New left cortical infarct identified on MRI last night. Pt unable to tolerate panorex dental exam. It was cancelled. Tagged WBC scan performed on 10/27/2019 did not demonstrate focal consolidation consistent with occult site of infection.  Recurrent Pseudomonas bacteremia with large mitral valve vegetation: Likely secondary to HD catheter, previously treated with 6-week course of IV [name] which was completed 10/15, previous HD catheter was removed and she had a new right IJ tunneled catheter placed  then after discussion with nephrology and infectious disease, HD catheter was removed 11/30 in IR for line holiday, repeat blood cultures today. It was replaced again this afternoon. Continue IV Cefepime. MRI of left hip demonstrated osteodystrophy, but no signs of osteomyelitis. There was some edema associated with the paraspinal myscles and the psoas on the left. Back in the end of August when she had the first episode of  Pseudomonas bacteremia and there was concern for possible discitis of L3-L4, this was later felt to be renal spondyloarthropathy on repeat imaging. Monitor. I appreciate infectious disease's assistance. Surveillance cultures have had no growth. She will require a further 6 weeks of antibiotics. Day one determination is still pending.  Mitral Valve Vegetation: [name] for CTS feels that the best course at this time is continuation of IV antibiotics. He does not feel that there is a currently a role for mitral valve repair/replacement due to risk for  recurrent infection of a new bioprosthetic valve. She will need to have negative blood cultures for 3 weeks before surgery could be considered. She will also need improvement in her functional/nutritional status. Continue cefepime.  Right IJ acute DVT: This is secondary to her HD catheter, discussed with renal, given concurrent bacteremia will need HD catheter removal. Treat with anticoagulation for 3 months after catheter removal. As ESRD options are limited, we will continue IV heparin without bolus and aim for a lower PTT, and  hold off on Coumadin until new dialysis access issues sorted out. Coumadin has been restarted.  ESRD: Nephrology consulted, usually dialyzes Monday Wednesday Friday. Right IJ HD catheter removed 11/30, last HD was yesterday 11/30. HD catheter placed again on 10/26/2019. HD performed yesterday evening.  Diabetes mellitus type 2: Hemoglobin A1c is 7.6. She is on a NovoLog sliding scale.  Essential hypertension: Restart Coreg, amlodipine on hold. Hydralazine prn SBP >220  Anemia of chronic disease: Per renal.  I have seen and examined this patient myself. I have spent 32 minutes in her evaluation and care.  DVT prophylaxis: Heparin IV Code Status: DNR Family Communication: No family at bedside. Disposition Plan: Possibly CIR when above work-up completed  Diana Warshaw, DO [facility] Direct contact: see [email or web address]  7PM-7AM contact night coverage as above 10/31/2019, 4:21 PM  LOS: 5 days

## 2019-10-31 NOTE — Progress Notes (Signed)
Physical Therapy Treatment Patient Details Name: [name] MRN: [id number] DOB: 12-Apr-1951 Today's Date: 10/31/2019    History of Present Illness [name] is a 68 y.o. female with history of ESRD ( on dialysis M-W-F), TIA 2010, HTN, HLD, Ha, DM2 who presented to  [facility] with c/o dizziness, left arm weakness and left leg numbness. MRI revealed acute R basal ganglia infarct. In addition, she has R IJ DVT and spiked a fever 11/27 with concern for sepsis.     PT Comments    Pt remains very motivated to progress and improve so she can return home.  She continues to require external assistance of min-moderate assistance.  Close chair follow provided as she fatigues with activity.  Pt able to progress gt from 8 ft yesterday to 20 ft today.  Pain is better managed today.  Will continue to progress patient with in her tolerance.  She is an excellent candidate for intensive therapies in post acute setting.     Follow Up Recommendations  CIR;Supervision/Assistance - 24 hour     Equipment Recommendations  None recommended by PT    Recommendations for Other Services Rehab consult     Precautions / Restrictions Precautions Precautions: Fall Required Braces or Orthoses: Spinal Brace Spinal Brace: Thoracolumbosacral orthotic;Other (comment) Spinal Brace Comments: for comfort with mobility. Restrictions Weight Bearing Restrictions: No    Mobility  Bed Mobility Overal bed mobility: Needs Assistance Bed Mobility: Rolling;Sidelying to Sit Rolling: Min assist Sidelying to sit: Min assist       General bed mobility comments: Pt required assistance for rolling and trunk elevation, heavy use of railing but better ability to move into sitting.  Pt with heavy reliance on BUE support in sitting and total assistance to donn brace in sitting.  Transfers Overall transfer level: Needs assistance Equipment used: Rolling walker (2 wheeled) Transfers: Sit to/from  Stand Sit to Stand: Mod assist         General transfer comment: Cues for hand placement to and from seated surface.  Bed placed in elevated height for improved ease, poor eccentric load returning to chair due to weakness.  Ambulation/Gait Ambulation/Gait assistance: Mod assist;+2 safety/equipment Gait Distance (Feet): 20 Feet Assistive device: Rolling walker (2 wheeled) Gait Pattern/deviations: Shuffle;Trunk flexed;Narrow base of support;Decreased stride length;Step-through pattern     General Gait Details: Cues for upper trunk control and RW position.  Cues for B stride increase.  Close chair follow due to slow buckling last session.  Good progression of gt distance.   Stairs             Wheelchair Mobility    Modified Rankin (Stroke Patients Only)       Balance Overall balance assessment: Needs assistance Sitting-balance support: Feet supported;Bilateral upper extremity supported Sitting balance-Leahy Scale: Poor Sitting balance - Comments: Noted posterior lean as well.  Cues to weight shift forward to place weight in B feet.  Heavy B UE support in sitting. Postural control: Left lateral lean Standing balance support: Bilateral upper extremity supported;During functional activity Standing balance-Leahy Scale: Poor Standing balance comment: Heavy reliance on RW in standing.                            Cognition Arousal/Alertness: Awake/alert Behavior During Therapy: WFL for tasks assessed/performed Overall Cognitive Status: Within Functional Limits for tasks assessed  Exercises      General Comments        Pertinent Vitals/Pain Pain Assessment: 0-10 Pain Score: 5  Pain Location: lower back Pain Descriptors / Indicators: Grimacing;Moaning;Aching Pain Intervention(s): Monitored during session;Repositioned    Home Living                      Prior Function            PT  Goals (current goals can now be found in the care plan section) Acute Rehab PT Goals Patient Stated Goal: to move better Potential to Achieve Goals: Fair Progress towards PT goals: Progressing toward goals    Frequency    Min 4X/week      PT Plan Current plan remains appropriate    Co-evaluation              AM-PAC PT "6 Clicks" Mobility   Outcome Measure  Help needed turning from your back to your side while in a flat bed without using bedrails?: A Little Help needed moving from lying on your back to sitting on the side of a flat bed without using bedrails?: A Little Help needed moving to and from a bed to a chair (including a wheelchair)?: A Lot Help needed standing up from a chair using your arms (e.g., wheelchair or bedside chair)?: A Lot Help needed to walk in hospital room?: A Lot Help needed climbing 3-5 steps with a railing? : Total 6 Click Score: 13    End of Session Equipment Utilized During Treatment: Gait belt;Back brace Activity Tolerance: Patient limited by pain Patient left: in chair;with call bell/phone within reach;with family/visitor present Nurse Communication: Mobility status PT Visit Diagnosis: Muscle weakness (generalized) (M62.81);Other abnormalities of gait and mobility (R26.89);Pain Pain - Right/Left: Left Pain - part of body: Hip     Time: [id number] PT Time Calculation (min) (ACUTE ONLY): 19 min  Charges:  $Gait Training: 8-22 mins                     Erasmo Leventhal , PTA Acute Rehabilitation Services Pager [phone] Office [phone]     Quanika Solem Eli Hose 10/31/2019, 5:26 PM

## 2019-10-31 NOTE — Progress Notes (Signed)
Nutrition Follow-up  DOCUMENTATION CODES:   Not applicable  INTERVENTION:  Continue Ensure Enlive po BID, each supplement provides 350 kcal and 20 grams of protein  Encourage adequate PO intake.   NUTRITION DIAGNOSIS:   Increased nutrient needs related to chronic illness(ESRD) as evidenced by estimated needs; ongoing  GOAL:   Patient will meet greater than or equal to 90% of their needs; met  MONITOR:   PO intake, Supplement acceptance, Labs, Weight trends, Skin, I & O's  REASON FOR ASSESSMENT:   Diagnosis    ASSESSMENT:   68 y.o. female with medical history of ESRD (MWF), diabetes mellitus type 2, GERD, stroke, hypertension, hyperlipidemia admitted for acute ischemic stroke. Recurrent Pseudomonas bacteremia with large mitral valve vegetation likely secondary to HD catheterRight IJ HD catheter removed 11/30. HD catheter placed again on 10/26/2019.   Meal completion has been 80%. Pt currently has Ensure ordered and has been consuming them. RD to continue with current orders to aid in caloric and protein needs. Pt encouraged to eat her food at meals and to drink her supplements.    Labs and medications reviewed.   Diet Order:   Diet Order            Diet regular Room service appropriate? Yes; Fluid consistency: Thin  Diet effective now              EDUCATION NEEDS:   Education needs have been addressed  Skin:  Skin Assessment: Reviewed RN Assessment Skin Integrity Issues:: Incisions Incisions: N/A  Last BM:  12/4  Height:   Ht Readings from Last 1 Encounters:  10/20/19 '5\' 7"'  (1.702 m)    Weight:   Wt Readings from Last 1 Encounters:  10/30/19 61.6 kg    Ideal Body Weight:  61.4 kg  BMI:  Body mass index is 21.27 kg/m.  Estimated Nutritional Needs:   Kcal:  1800-2000  Protein:  85-100 grams  Fluid:  </= 1.2 L    Corrin Parker, MS, RD, LDN Pager # [phone] After hours/ weekend pager # [phone]

## 2019-10-31 NOTE — Progress Notes (Signed)
Patient ID: [name], female   DOB: 18-Dec-1950, 68 y.o.   MRN: [id number]   [facility] Progress Note   Assessment/ Plan:   1.  Recurrent Pseudomonas bacteremia (Likely HD CRB) with evidence of mitral valve endocarditis prompting exchange of hemodialysis catheter awaiting repeat blood cultures and recommendations by ID.  No surgical indications for repair/replacement at this time per CT surgery; medical management recommended with reevaluation post completion of antibiotics for bacteremia. 2. ESRD: She is now back on her usual outpatient Monday/Wednesday/Friday dialysis schedule as we await disposition following recommended work-up for SBE. 3. Anemia: Without overt blood loss, continue to monitor on ESA 4. CKD-MBD: Phosphorus level within acceptable range, monitor on renal diet/binders 5.  Acute CVA with evidence of chronic ischemia/old infarcts: Ongoing management per neurology, evidence of mitral valve endocarditis.  On intravenous cefepime and oral ciprofloxacin. 6. Hypertension: Improving blood pressure noted status post recent CVA.  Subjective:   Reports to have had a rough night because of headache and poor sleep.   Objective:   BP 135/75 (BP Location: Left Arm)   Pulse 79   Temp 98.6 F (37 C) (Oral)   Resp 13   Ht 5\' 7"  (1.702 m)   Wt 61.6 kg   SpO2 98%   BMI 21.27 kg/m   Physical Exam: Gen: Appears comfortable resting in bed, breakfast tray untouched CVS: Regular rhythm, normal rate, S1 and S2 normal Resp: Clear to auscultation bilaterally, no audible rales or rhonchi Abd: Soft, flat, nontender Ext: No lower extremity edema.  Right IJ [facility]  Labs: BMET Recent Labs  Lab 10/25/19 0314 10/26/19 0353 10/27/19 0255 10/28/19 0314 10/29/19 0319 10/30/19 0438 10/31/19 0358  NA 137 137 134* 136 138 135 133*  K 4.8 4.9 3.6 4.1 4.2 4.5 3.8  CL 98 97* 95* 98 100 97* 95*  CO2 21* 23 24 25 27 25 26   GLUCOSE 113* 111* 128* 146* 108* 175* 166*  BUN 53*  68* 20 38* 12 32* 13  CREATININE 9.14* 10.57* 4.76* 6.70* 3.67* 5.81* 3.41*  CALCIUM 8.4* 8.1* 8.0* 8.4* 8.6* 8.5* 8.4*  PHOS 5.5* 6.4* 3.6 5.0* 3.6 4.7* 3.3   CBC Recent Labs  Lab 10/28/19 0314 10/29/19 0319 10/30/19 0438 10/31/19 0358  WBC 12.5* 14.0* 13.2* 10.9*  HGB 7.0* 6.7* 8.3* 8.1*  HCT 21.9* 21.6* 26.2* 25.6*  MCV 89.4 92.7 91.3 91.8  PLT 201 201 205 197    Medications:    . sodium chloride   Intravenous Once  . acetaminophen  1,000 mg Oral Once  . calcium carbonate  1 tablet Oral 2 times per day  . carvedilol  6.25 mg Oral BID  . Chlorhexidine Gluconate Cloth  6 each Topical Q0600  . ciprofloxacin  500 mg Oral q1800  . doxercalciferol  1.5 mcg Intravenous Q M,W,F-HD  . feeding supplement (ENSURE ENLIVE)  237 mL Oral BID BM  . gabapentin  100 mg Oral QHS  . insulin aspart  0-5 Units Subcutaneous QHS  . insulin aspart  0-9 Units Subcutaneous TID WC  . isosorbide mononitrate  30 mg Oral QPC lunch  . multivitamin  1 tablet Oral QPC lunch  . Warfarin - Pharmacist Dosing Inpatient   Does not apply [id number]   Elmarie Shiley, MD 10/31/2019, 8:33 AM

## 2019-11-01 ENCOUNTER — Inpatient Hospital Stay (HOSPITAL_COMMUNITY)
Admission: RE | Admit: 2019-11-01 | Discharge: 2019-12-05 | DRG: 056 | Disposition: A | Discharge status: Home Health | Payer: Medicare PPO | Source: Intra-hospital | Attending: Physical Medicine & Rehabilitation | Admitting: Physical Medicine & Rehabilitation

## 2019-11-01 ENCOUNTER — Other Ambulatory Visit: Payer: Self-pay

## 2019-11-01 ENCOUNTER — Encounter (HOSPITAL_COMMUNITY): Payer: Self-pay

## 2019-11-01 DIAGNOSIS — R791 Abnormal coagulation profile: Secondary | ICD-10-CM | POA: Diagnosis not present

## 2019-11-01 DIAGNOSIS — I6381 Other cerebral infarction due to occlusion or stenosis of small artery: Secondary | ICD-10-CM

## 2019-11-01 DIAGNOSIS — M4626 Osteomyelitis of vertebra, lumbar region: Secondary | ICD-10-CM | POA: Diagnosis not present

## 2019-11-01 DIAGNOSIS — I69354 Hemiplegia and hemiparesis following cerebral infarction affecting left non-dominant side: Secondary | ICD-10-CM | POA: Diagnosis not present

## 2019-11-01 DIAGNOSIS — Z992 Dependence on renal dialysis: Secondary | ICD-10-CM | POA: Diagnosis not present

## 2019-11-01 DIAGNOSIS — G061 Intraspinal abscess and granuloma: Secondary | ICD-10-CM | POA: Diagnosis not present

## 2019-11-01 DIAGNOSIS — M7062 Trochanteric bursitis, left hip: Secondary | ICD-10-CM

## 2019-11-01 DIAGNOSIS — R109 Unspecified abdominal pain: Secondary | ICD-10-CM | POA: Diagnosis not present

## 2019-11-01 DIAGNOSIS — M4646 Discitis, unspecified, lumbar region: Secondary | ICD-10-CM | POA: Diagnosis not present

## 2019-11-01 DIAGNOSIS — R1084 Generalized abdominal pain: Secondary | ICD-10-CM | POA: Diagnosis not present

## 2019-11-01 DIAGNOSIS — E1169 Type 2 diabetes mellitus with other specified complication: Secondary | ICD-10-CM | POA: Diagnosis not present

## 2019-11-01 DIAGNOSIS — I639 Cerebral infarction, unspecified: Secondary | ICD-10-CM | POA: Diagnosis not present

## 2019-11-01 DIAGNOSIS — I10 Essential (primary) hypertension: Secondary | ICD-10-CM | POA: Diagnosis not present

## 2019-11-01 DIAGNOSIS — I953 Hypotension of hemodialysis: Secondary | ICD-10-CM | POA: Diagnosis not present

## 2019-11-01 DIAGNOSIS — I059 Rheumatic mitral valve disease, unspecified: Secondary | ICD-10-CM

## 2019-11-01 DIAGNOSIS — B965 Pseudomonas (aeruginosa) (mallei) (pseudomallei) as the cause of diseases classified elsewhere: Secondary | ICD-10-CM | POA: Diagnosis present

## 2019-11-01 DIAGNOSIS — I340 Nonrheumatic mitral (valve) insufficiency: Secondary | ICD-10-CM | POA: Diagnosis not present

## 2019-11-01 DIAGNOSIS — B379 Candidiasis, unspecified: Secondary | ICD-10-CM | POA: Diagnosis not present

## 2019-11-01 DIAGNOSIS — K219 Gastro-esophageal reflux disease without esophagitis: Secondary | ICD-10-CM | POA: Diagnosis present

## 2019-11-01 DIAGNOSIS — E785 Hyperlipidemia, unspecified: Secondary | ICD-10-CM | POA: Diagnosis present

## 2019-11-01 DIAGNOSIS — E7220 Disorder of urea cycle metabolism, unspecified: Secondary | ICD-10-CM | POA: Diagnosis not present

## 2019-11-01 DIAGNOSIS — T45515A Adverse effect of anticoagulants, initial encounter: Secondary | ICD-10-CM | POA: Diagnosis not present

## 2019-11-01 DIAGNOSIS — M48061 Spinal stenosis, lumbar region without neurogenic claudication: Secondary | ICD-10-CM | POA: Diagnosis not present

## 2019-11-01 DIAGNOSIS — R7309 Other abnormal glucose: Secondary | ICD-10-CM | POA: Diagnosis not present

## 2019-11-01 DIAGNOSIS — R0689 Other abnormalities of breathing: Secondary | ICD-10-CM

## 2019-11-01 DIAGNOSIS — R569 Unspecified convulsions: Secondary | ICD-10-CM | POA: Diagnosis not present

## 2019-11-01 DIAGNOSIS — K639 Disease of intestine, unspecified: Secondary | ICD-10-CM | POA: Diagnosis not present

## 2019-11-01 DIAGNOSIS — I82C11 Acute embolism and thrombosis of right internal jugular vein: Secondary | ICD-10-CM | POA: Diagnosis present

## 2019-11-01 DIAGNOSIS — B3749 Other urogenital candidiasis: Secondary | ICD-10-CM | POA: Diagnosis not present

## 2019-11-01 DIAGNOSIS — R112 Nausea with vomiting, unspecified: Secondary | ICD-10-CM

## 2019-11-01 DIAGNOSIS — I8290 Acute embolism and thrombosis of unspecified vein: Secondary | ICD-10-CM | POA: Diagnosis not present

## 2019-11-01 DIAGNOSIS — K5901 Slow transit constipation: Secondary | ICD-10-CM

## 2019-11-01 DIAGNOSIS — G253 Myoclonus: Secondary | ICD-10-CM | POA: Diagnosis not present

## 2019-11-01 DIAGNOSIS — Z833 Family history of diabetes mellitus: Secondary | ICD-10-CM | POA: Diagnosis not present

## 2019-11-01 DIAGNOSIS — M25552 Pain in left hip: Secondary | ICD-10-CM | POA: Diagnosis present

## 2019-11-01 DIAGNOSIS — L89150 Pressure ulcer of sacral region, unstageable: Secondary | ICD-10-CM | POA: Diagnosis not present

## 2019-11-01 DIAGNOSIS — I330 Acute and subacute infective endocarditis: Secondary | ICD-10-CM | POA: Diagnosis not present

## 2019-11-01 DIAGNOSIS — G8190 Hemiplegia, unspecified affecting unspecified side: Secondary | ICD-10-CM | POA: Diagnosis not present

## 2019-11-01 DIAGNOSIS — Z8249 Family history of ischemic heart disease and other diseases of the circulatory system: Secondary | ICD-10-CM

## 2019-11-01 DIAGNOSIS — I748 Embolism and thrombosis of other arteries: Secondary | ICD-10-CM | POA: Diagnosis not present

## 2019-11-01 DIAGNOSIS — R0989 Other specified symptoms and signs involving the circulatory and respiratory systems: Secondary | ICD-10-CM | POA: Diagnosis not present

## 2019-11-01 DIAGNOSIS — R64 Cachexia: Secondary | ICD-10-CM | POA: Diagnosis not present

## 2019-11-01 DIAGNOSIS — M549 Dorsalgia, unspecified: Secondary | ICD-10-CM

## 2019-11-01 DIAGNOSIS — N186 End stage renal disease: Secondary | ICD-10-CM | POA: Diagnosis not present

## 2019-11-01 DIAGNOSIS — I82401 Acute embolism and thrombosis of unspecified deep veins of right lower extremity: Secondary | ICD-10-CM | POA: Diagnosis not present

## 2019-11-01 DIAGNOSIS — Z7982 Long term (current) use of aspirin: Secondary | ICD-10-CM

## 2019-11-01 DIAGNOSIS — N2581 Secondary hyperparathyroidism of renal origin: Secondary | ICD-10-CM | POA: Diagnosis not present

## 2019-11-01 DIAGNOSIS — G8929 Other chronic pain: Secondary | ICD-10-CM | POA: Diagnosis present

## 2019-11-01 DIAGNOSIS — I120 Hypertensive chronic kidney disease with stage 5 chronic kidney disease or end stage renal disease: Secondary | ICD-10-CM | POA: Diagnosis present

## 2019-11-01 DIAGNOSIS — K7682 Hepatic encephalopathy: Secondary | ICD-10-CM

## 2019-11-01 DIAGNOSIS — G479 Sleep disorder, unspecified: Secondary | ICD-10-CM | POA: Diagnosis not present

## 2019-11-01 DIAGNOSIS — D649 Anemia, unspecified: Secondary | ICD-10-CM | POA: Diagnosis not present

## 2019-11-01 DIAGNOSIS — Z66 Do not resuscitate: Secondary | ICD-10-CM | POA: Diagnosis not present

## 2019-11-01 DIAGNOSIS — Z811 Family history of alcohol abuse and dependence: Secondary | ICD-10-CM | POA: Diagnosis not present

## 2019-11-01 DIAGNOSIS — I6389 Other cerebral infarction: Secondary | ICD-10-CM | POA: Diagnosis not present

## 2019-11-01 DIAGNOSIS — M19042 Primary osteoarthritis, left hand: Secondary | ICD-10-CM | POA: Diagnosis present

## 2019-11-01 DIAGNOSIS — G9341 Metabolic encephalopathy: Secondary | ICD-10-CM | POA: Diagnosis not present

## 2019-11-01 DIAGNOSIS — M19041 Primary osteoarthritis, right hand: Secondary | ICD-10-CM | POA: Diagnosis present

## 2019-11-01 DIAGNOSIS — R102 Pelvic and perineal pain: Secondary | ICD-10-CM | POA: Diagnosis not present

## 2019-11-01 DIAGNOSIS — D631 Anemia in chronic kidney disease: Secondary | ICD-10-CM | POA: Diagnosis present

## 2019-11-01 DIAGNOSIS — R410 Disorientation, unspecified: Secondary | ICD-10-CM | POA: Diagnosis not present

## 2019-11-01 DIAGNOSIS — R932 Abnormal findings on diagnostic imaging of liver and biliary tract: Secondary | ICD-10-CM | POA: Diagnosis not present

## 2019-11-01 DIAGNOSIS — Z789 Other specified health status: Secondary | ICD-10-CM | POA: Diagnosis not present

## 2019-11-01 DIAGNOSIS — E1122 Type 2 diabetes mellitus with diabetic chronic kidney disease: Secondary | ICD-10-CM | POA: Diagnosis not present

## 2019-11-01 DIAGNOSIS — R933 Abnormal findings on diagnostic imaging of other parts of digestive tract: Secondary | ICD-10-CM | POA: Diagnosis not present

## 2019-11-01 DIAGNOSIS — I058 Other rheumatic mitral valve diseases: Secondary | ICD-10-CM | POA: Diagnosis not present

## 2019-11-01 DIAGNOSIS — K6389 Other specified diseases of intestine: Secondary | ICD-10-CM

## 2019-11-01 DIAGNOSIS — Z7409 Other reduced mobility: Secondary | ICD-10-CM | POA: Diagnosis not present

## 2019-11-01 DIAGNOSIS — I081 Rheumatic disorders of both mitral and tricuspid valves: Secondary | ICD-10-CM | POA: Diagnosis present

## 2019-11-01 DIAGNOSIS — I76 Septic arterial embolism: Secondary | ICD-10-CM | POA: Diagnosis not present

## 2019-11-01 DIAGNOSIS — R0600 Dyspnea, unspecified: Secondary | ICD-10-CM | POA: Diagnosis not present

## 2019-11-01 DIAGNOSIS — I618 Other nontraumatic intracerebral hemorrhage: Secondary | ICD-10-CM | POA: Diagnosis not present

## 2019-11-01 DIAGNOSIS — N250 Renal osteodystrophy: Secondary | ICD-10-CM | POA: Diagnosis not present

## 2019-11-01 DIAGNOSIS — Z452 Encounter for adjustment and management of vascular access device: Secondary | ICD-10-CM | POA: Diagnosis not present

## 2019-11-01 DIAGNOSIS — I361 Nonrheumatic tricuspid (valve) insufficiency: Secondary | ICD-10-CM | POA: Diagnosis not present

## 2019-11-01 DIAGNOSIS — E119 Type 2 diabetes mellitus without complications: Secondary | ICD-10-CM

## 2019-11-01 DIAGNOSIS — R7881 Bacteremia: Secondary | ICD-10-CM | POA: Diagnosis not present

## 2019-11-01 DIAGNOSIS — I6340 Cerebral infarction due to embolism of unspecified cerebral artery: Secondary | ICD-10-CM | POA: Diagnosis not present

## 2019-11-01 DIAGNOSIS — I517 Cardiomegaly: Secondary | ICD-10-CM | POA: Diagnosis not present

## 2019-11-01 DIAGNOSIS — R4182 Altered mental status, unspecified: Secondary | ICD-10-CM | POA: Diagnosis not present

## 2019-11-01 DIAGNOSIS — K5981 Ogilvie syndrome: Secondary | ICD-10-CM | POA: Diagnosis not present

## 2019-11-01 DIAGNOSIS — Z7901 Long term (current) use of anticoagulants: Secondary | ICD-10-CM

## 2019-11-01 DIAGNOSIS — K7290 Hepatic failure, unspecified without coma: Secondary | ICD-10-CM | POA: Diagnosis not present

## 2019-11-01 DIAGNOSIS — N281 Cyst of kidney, acquired: Secondary | ICD-10-CM | POA: Diagnosis not present

## 2019-11-01 DIAGNOSIS — K6812 Psoas muscle abscess: Secondary | ICD-10-CM | POA: Diagnosis not present

## 2019-11-01 DIAGNOSIS — G3189 Other specified degenerative diseases of nervous system: Secondary | ICD-10-CM | POA: Diagnosis not present

## 2019-11-01 LAB — BASIC METABOLIC PANEL
Anion gap: 13 (ref 5–15)
BUN: 31 mg/dL — ABNORMAL HIGH (ref 8–23)
CO2: 27 mmol/L (ref 22–32)
Calcium: 8.9 mg/dL (ref 8.9–10.3)
Chloride: 96 mmol/L — ABNORMAL LOW (ref 98–111)
Creatinine, Ser: 5.59 mg/dL — ABNORMAL HIGH (ref 0.44–1.00)
GFR calc Af Amer: 8 mL/min — ABNORMAL LOW (ref >60)
GFR calc non Af Amer: 7 mL/min — ABNORMAL LOW (ref >60)
Glucose, Bld: 158 mg/dL — ABNORMAL HIGH (ref 70–99)
Potassium: 4.7 mmol/L (ref 3.5–5.1)
Sodium: 136 mmol/L (ref 135–145)

## 2019-11-01 LAB — GLUCOSE, CAPILLARY
Glucose-Capillary: 153 mg/dL — ABNORMAL HIGH (ref 70–99)
Glucose-Capillary: 126 mg/dL — ABNORMAL HIGH (ref 70–99)
Glucose-Capillary: 218 mg/dL — ABNORMAL HIGH (ref 70–99)
Glucose-Capillary: 199 mg/dL — ABNORMAL HIGH (ref 70–99)

## 2019-11-01 LAB — CBC
HCT: 24.6 % — ABNORMAL LOW (ref 36.0–46.0)
Hemoglobin: 7.7 g/dL — ABNORMAL LOW (ref 12.0–15.0)
MCH: 28.6 pg (ref 26.0–34.0)
MCHC: 31.3 g/dL (ref 30.0–36.0)
MCV: 91.4 fL (ref 80.0–100.0)
Platelets: 206 10*3/uL (ref 150–400)
RBC: 2.69 MIL/uL — ABNORMAL LOW (ref 3.87–5.11)
RDW: 15.9 % — ABNORMAL HIGH (ref 11.5–15.5)
WBC: 8.7 10*3/uL (ref 4.0–10.5)
nRBC: 0 % (ref 0.0–0.2)

## 2019-11-01 LAB — PROTIME-INR
INR: 2.6 — ABNORMAL HIGH (ref 0.8–1.2)
Prothrombin Time: 28 seconds — ABNORMAL HIGH (ref 11.4–15.2)

## 2019-11-01 MED ORDER — CIPROFLOXACIN HCL 500 MG PO TABS
500.0000 mg | ORAL_TABLET | Freq: Every day | ORAL | Status: DC
  Administered 2019-11-01 – 2019-11-06 (×6): 500 mg via ORAL
  Filled 2019-11-01 (×6): qty 1

## 2019-11-01 MED ORDER — RENA-VITE PO TABS
1.0000 | ORAL_TABLET | Freq: Every day | ORAL | Status: DC
  Administered 2019-11-01 – 2019-12-04 (×32): 1 via ORAL
  Filled 2019-11-01 (×33): qty 1

## 2019-11-01 MED ORDER — DOXERCALCIFEROL 4 MCG/2ML IV SOLN
1.5000 ug | INTRAVENOUS | Status: DC
  Administered 2019-11-03 – 2019-12-04 (×7): 1.5 ug via INTRAVENOUS
  Filled 2019-11-01 (×14): qty 2

## 2019-11-01 MED ORDER — HEPARIN SODIUM (PORCINE) 1000 UNIT/ML DIALYSIS: 40.0000 [IU]/kg | INTRAMUSCULAR | Status: DC | PRN

## 2019-11-01 MED ORDER — SORBITOL 70 % SOLN
30.0000 mL | Freq: Every day | Status: DC | PRN
  Administered 2019-11-02 – 2019-12-02 (×3): 30 mL via ORAL
  Filled 2019-11-01 (×6): qty 30

## 2019-11-01 MED ORDER — CEFEPIME IV (FOR PTA / DISCHARGE USE ONLY): 1.0000 g | INTRAVENOUS | 0 refills | Status: DC

## 2019-11-01 MED ORDER — DOXERCALCIFEROL 4 MCG/2ML IV SOLN: 1.5000 ug | INTRAVENOUS | 0 refills | Status: DC

## 2019-11-01 MED ORDER — ACETAMINOPHEN 325 MG PO TABS
650.0000 mg | ORAL_TABLET | ORAL | Status: DC | PRN
  Administered 2019-11-03 – 2019-11-04 (×2): 650 mg via ORAL
  Filled 2019-11-01 (×2): qty 2

## 2019-11-01 MED ORDER — SODIUM CHLORIDE 0.9 % IV SOLN
1.0000 g | INTRAVENOUS | Status: DC
  Administered 2019-11-01 – 2019-11-16 (×16): 1 g via INTRAVENOUS
  Filled 2019-11-01 (×18): qty 1

## 2019-11-01 MED ORDER — HEPARIN SODIUM (PORCINE) 1000 UNIT/ML DIALYSIS
40.0000 [IU]/kg | INTRAMUSCULAR | Status: DC | PRN
  Filled 2019-11-01: qty 2.5

## 2019-11-01 MED ORDER — DARBEPOETIN ALFA 100 MCG/0.5ML IJ SOSY
PREFILLED_SYRINGE | INTRAMUSCULAR | Status: AC
  Filled 2019-11-01: qty 0.5

## 2019-11-01 MED ORDER — FLUTICASONE PROPIONATE 50 MCG/ACT NA SUSP
1.0000 | Freq: Every day | NASAL | Status: DC | PRN
  Filled 2019-11-01: qty 16

## 2019-11-01 MED ORDER — SENNOSIDES-DOCUSATE SODIUM 8.6-50 MG PO TABS: 1.0000 | ORAL_TABLET | Freq: Every evening | ORAL | Status: DC | PRN

## 2019-11-01 MED ORDER — ACETAMINOPHEN 650 MG RE SUPP: 650.0000 mg | RECTAL | Status: DC | PRN

## 2019-11-01 MED ORDER — INSULIN ASPART 100 UNIT/ML ~~LOC~~ SOLN
0.0000 [IU] | Freq: Three times a day (TID) | SUBCUTANEOUS | Status: DC
  Administered 2019-11-02: 7 [IU] via SUBCUTANEOUS
  Administered 2019-11-02: 2 [IU] via SUBCUTANEOUS
  Administered 2019-11-02: 07:00:00 1 [IU] via SUBCUTANEOUS
  Administered 2019-11-03 (×2): 2 [IU] via SUBCUTANEOUS
  Administered 2019-11-03: 08:00:00 1 [IU] via SUBCUTANEOUS
  Administered 2019-11-04: 13:00:00 2 [IU] via SUBCUTANEOUS
  Administered 2019-11-04: 1 [IU] via SUBCUTANEOUS
  Administered 2019-11-04 – 2019-11-06 (×4): 2 [IU] via SUBCUTANEOUS
  Administered 2019-11-07: 1 [IU] via SUBCUTANEOUS
  Administered 2019-11-07: 2 [IU] via SUBCUTANEOUS
  Administered 2019-11-08 (×2): 1 [IU] via SUBCUTANEOUS
  Administered 2019-11-09 (×2): 2 [IU] via SUBCUTANEOUS
  Administered 2019-11-09 – 2019-11-10 (×2): 1 [IU] via SUBCUTANEOUS
  Administered 2019-11-10 – 2019-11-11 (×3): 2 [IU] via SUBCUTANEOUS
  Administered 2019-11-11: 12:00:00 3 [IU] via SUBCUTANEOUS
  Administered 2019-11-12 (×2): 1 [IU] via SUBCUTANEOUS
  Administered 2019-11-12 – 2019-11-14 (×2): 2 [IU] via SUBCUTANEOUS
  Administered 2019-11-14: 3 [IU] via SUBCUTANEOUS
  Administered 2019-11-15 – 2019-11-16 (×2): 1 [IU] via SUBCUTANEOUS
  Administered 2019-11-17 (×2): 2 [IU] via SUBCUTANEOUS
  Administered 2019-11-18 – 2019-11-20 (×3): 1 [IU] via SUBCUTANEOUS
  Administered 2019-11-21 – 2019-11-23 (×3): 2 [IU] via SUBCUTANEOUS
  Administered 2019-11-23 – 2019-11-26 (×3): 1 [IU] via SUBCUTANEOUS
  Administered 2019-11-27: 2 [IU] via SUBCUTANEOUS
  Administered 2019-11-28: 5 [IU] via SUBCUTANEOUS
  Administered 2019-11-29: 1 [IU] via SUBCUTANEOUS
  Administered 2019-11-30 – 2019-12-02 (×5): 2 [IU] via SUBCUTANEOUS
  Administered 2019-12-03 – 2019-12-04 (×3): 1 [IU] via SUBCUTANEOUS

## 2019-11-01 MED ORDER — DOCUSATE SODIUM 100 MG PO CAPS
200.0000 mg | ORAL_CAPSULE | Freq: Once | ORAL | Status: AC
  Administered 2019-11-01: 200 mg via ORAL
  Filled 2019-11-01: qty 2

## 2019-11-01 MED ORDER — WARFARIN SODIUM 2.5 MG PO TABS: 2.5000 mg | ORAL_TABLET | Freq: Every day | ORAL | 0 refills | Status: DC

## 2019-11-01 MED ORDER — ENSURE ENLIVE PO LIQD
237.0000 mL | Freq: Two times a day (BID) | ORAL | Status: DC
  Administered 2019-11-02 – 2019-11-27 (×27): 237 mL via ORAL

## 2019-11-01 MED ORDER — OXYCODONE HCL 5 MG PO TABS: 5.0000 mg | ORAL_TABLET | Freq: Four times a day (QID) | ORAL | 0 refills | Status: DC | PRN

## 2019-11-01 MED ORDER — DARBEPOETIN ALFA 100 MCG/0.5ML IJ SOSY
100.0000 ug | PREFILLED_SYRINGE | INTRAMUSCULAR | Status: DC
  Administered 2019-11-01 (×2): 100 ug via INTRAVENOUS

## 2019-11-01 MED ORDER — WARFARIN - PHARMACIST DOSING INPATIENT
Freq: Every day | Status: DC
  Administered 2019-11-01 – 2019-11-05 (×2)

## 2019-11-01 MED ORDER — WARFARIN SODIUM 2.5 MG PO TABS
2.5000 mg | ORAL_TABLET | Freq: Once | ORAL | Status: AC
  Administered 2019-11-01: 2.5 mg via ORAL
  Filled 2019-11-01: qty 1

## 2019-11-01 MED ORDER — CARVEDILOL 6.25 MG PO TABS
6.2500 mg | ORAL_TABLET | Freq: Two times a day (BID) | ORAL | Status: DC
  Administered 2019-11-01 – 2019-11-07 (×10): 6.25 mg via ORAL
  Filled 2019-11-01 (×10): qty 1

## 2019-11-01 MED ORDER — ACETAMINOPHEN 160 MG/5ML PO SOLN: 650.0000 mg | ORAL | Status: DC | PRN

## 2019-11-01 MED ORDER — HEPARIN SODIUM (PORCINE) 1000 UNIT/ML IJ SOLN
INTRAMUSCULAR | Status: AC
  Administered 2019-11-01: 3200 [IU] via INTRAVENOUS_CENTRAL
  Filled 2019-11-01: qty 4

## 2019-11-01 MED ORDER — ENSURE ENLIVE PO LIQD: 237.0000 mL | Freq: Two times a day (BID) | ORAL | 12 refills | Status: DC

## 2019-11-01 MED ORDER — WARFARIN SODIUM 2.5 MG PO TABS
2.5000 mg | ORAL_TABLET | Freq: Every day | ORAL | Status: DC
  Filled 2019-11-01: qty 1

## 2019-11-01 MED ORDER — CALCIUM CARBONATE ANTACID 500 MG PO CHEW
1.0000 | CHEWABLE_TABLET | ORAL | Status: DC
  Administered 2019-11-02 – 2019-12-04 (×48): 200 mg via ORAL
  Filled 2019-11-01 (×51): qty 1

## 2019-11-01 MED ORDER — INSULIN NPH ISOPHANE & REGULAR (70-30) 100 UNIT/ML ~~LOC~~ SUSP: 4.0000 [IU] | Freq: Two times a day (BID) | SUBCUTANEOUS | 11 refills | Status: DC

## 2019-11-01 MED ORDER — SENNOSIDES-DOCUSATE SODIUM 8.6-50 MG PO TABS: 1.0000 | ORAL_TABLET | Freq: Every evening | ORAL | 0 refills | Status: DC | PRN

## 2019-11-01 MED ORDER — OXYCODONE HCL 5 MG PO TABS
5.0000 mg | ORAL_TABLET | Freq: Four times a day (QID) | ORAL | Status: DC | PRN
  Administered 2019-11-01 – 2019-12-05 (×22): 5 mg via ORAL
  Filled 2019-11-01 (×23): qty 1

## 2019-11-01 MED ORDER — GABAPENTIN 100 MG PO CAPS
100.0000 mg | ORAL_CAPSULE | Freq: Every day | ORAL | Status: DC
  Administered 2019-11-01 – 2019-11-06 (×6): 100 mg via ORAL
  Filled 2019-11-01 (×6): qty 1

## 2019-11-01 MED ORDER — ISOSORBIDE MONONITRATE ER 30 MG PO TB24
30.0000 mg | ORAL_TABLET | Freq: Every day | ORAL | Status: DC
  Administered 2019-11-02 – 2019-12-04 (×24): 30 mg via ORAL
  Filled 2019-11-01 (×27): qty 1

## 2019-11-01 MED ORDER — CIPROFLOXACIN HCL 500 MG PO TABS: 500.0000 mg | ORAL_TABLET | Freq: Every day | ORAL | 1 refills | Status: DC

## 2019-11-01 NOTE — Progress Notes (Signed)
Patient ID: [name], female   DOB: 22-Jun-1951, 68 y.o.   MRN: [id number]  [facility] Progress Note   Assessment/ Plan:   1.  Recurrent Pseudomonas bacteremia (Likely HD CRB) with evidence of mitral valve endocarditis prompting exchange of hemodialysis catheter awaiting repeat blood cultures and recommendations by ID.  No surgical indications for repair/replacement at this time per CT surgery; medical management recommended with reevaluation post completion of antibiotics for bacteremia. 2. ESRD: Continue HD on Monday/Wednesday/Friday dialysis schedule as we await disposition following work-up for SBE. 3. Anemia: Without overt blood loss, redose ESA, no indications for PRBC transfusion.  4. CKD-MBD: Phosphorus level within acceptable range, monitor on renal diet/binders 5.  Acute CVA with evidence of chronic ischemia/old infarcts: Ongoing management per neurology, evidence of mitral valve endocarditis.  On intravenous cefepime and oral ciprofloxacin. 6. Hypertension: Improving blood pressure noted status post recent CVA.  Subjective:   No acute events overnight.   Objective:   BP (!) 180/90 (BP Location: Right Arm)   Pulse 82   Temp 98 F (36.7 C) (Oral)   Resp 15   Ht 5\' 7"  (1.702 m)   Wt 62.6 kg   SpO2 100%   BMI 21.62 kg/m   Physical Exam: Gen: Appears comfortable resting in dialysis CVS: Regular rhythm, normal rate, S1 and S2 normal Resp: Clear to auscultation bilaterally, no audible rales or rhonchi Abd: Soft, flat, nontender Ext: No lower extremity edema.  Right IJ [facility]  Labs: BMET Recent Labs  Lab 10/26/19 0353 10/27/19 0255 10/28/19 0314 10/29/19 0319 10/30/19 0438 10/31/19 0358 11/01/19 0421  NA 137 134* 136 138 135 133* 136  K 4.9 3.6 4.1 4.2 4.5 3.8 4.7  CL 97* 95* 98 100 97* 95* 96*  CO2 23 24 25 27 25 26 27   GLUCOSE 111* 128* 146* 108* 175* 166* 158*  BUN 68* 20 38* 12 32* 13 31*  CREATININE 10.57* 4.76* 6.70* 3.67* 5.81* 3.41*  5.59*  CALCIUM 8.1* 8.0* 8.4* 8.6* 8.5* 8.4* 8.9  PHOS 6.4* 3.6 5.0* 3.6 4.7* 3.3  --    CBC Recent Labs  Lab 10/29/19 0319 10/30/19 0438 10/31/19 0358 11/01/19 0421  WBC 14.0* 13.2* 10.9* 8.7  HGB 6.7* 8.3* 8.1* 7.7*  HCT 21.6* 26.2* 25.6* 24.6*  MCV 92.7 91.3 91.8 91.4  PLT 201 205 197 206    Medications:    . sodium chloride   Intravenous Once  . acetaminophen  1,000 mg Oral Once  . calcium carbonate  1 tablet Oral 2 times per day  . carvedilol  6.25 mg Oral BID  . Chlorhexidine Gluconate Cloth  6 each Topical Q0600  . ciprofloxacin  500 mg Oral q1800  . doxercalciferol  1.5 mcg Intravenous Q M,W,F-HD  . feeding supplement (ENSURE ENLIVE)  237 mL Oral BID BM  . gabapentin  100 mg Oral QHS  . insulin aspart  0-5 Units Subcutaneous QHS  . insulin aspart  0-9 Units Subcutaneous TID WC  . isosorbide mononitrate  30 mg Oral QPC lunch  . multivitamin  1 tablet Oral QPC lunch  . Warfarin - Pharmacist Dosing Inpatient   Does not apply [id number]   Elmarie Shiley, MD 11/01/2019, 8:05 AM

## 2019-11-01 NOTE — Progress Notes (Signed)
Inpatient Rehabilitation-Admissions Coordinator   I have receive insurance approval and medical clearance from Dr. Benny Lennert for admit to CIR today. Pt and her son are in agreement for CIR. I have reviewed insurance benefit letter and consent forms signed. RN aware of plan for transition today. I will notify TOC team.   Please call if questions.   Jhonnie Garner, OTR/L  Rehab Admissions Coordinator  [phone] 11/01/2019 10:04 AM

## 2019-11-01 NOTE — H&P (Signed)
Physical Medicine and Rehabilitation Admission H&P    Chief Complaint  Patient presents with   Weakness   Dizziness  : HPI: [name] is a 68 year old right-handed female with history of end-stage renal disease with hemodialysis Monday Wednesday Fridays, diet controlled diabetes mellitus, pseudomonal bacteremia and early discitis August 2020 completing a 6-week course of Tressie Ellis as well as history of infected AV graft, TIA 2010 maintained on aspirin and Plavix, hypertension, hyperlipidemia.  Per chart review she lives with her brother.  Independent prior to admission.  Presented 10/20/2019 with left-sided weakness and difficulty with ambulation.  Her last dialysis was 10/18/2019.  CT of the brain showed acute right basal ganglia infarction.  Patient did not receive TPA.  Admission chemistries with creatinine 7.76, BUN 56, WBC 15,700, hemoglobin 9.7, SARS coronavirus negative.  MRI/MRA showed acute right basal ganglia infarction chronic ischemia with multiple old infarctions.  Negative head MRA.  Carotid Dopplers unremarkable.  Echocardiogram with ejection fraction of 65% without emboli or PFO.  Hospital course complicated by low-grade fever of 101 with findings of right IJ DVT on Dopplers.  Patient was placed on intravenous heparin and placed on Coumadin that she would remain on for approximately 3 months.  Infectious disease work-up during fever revealed Pseudomonas by BCID.  TEE performed on 10/26/2019 demonstrates large vegetation of mitral valve.  CTS consulted Dr. Darcey Nora felt the best course of action was continued IV antibiotics no current plan for mitral valve repair or replacement due to risk of recurrent infection at this time and current recommendations are to continue antibiotics 6 weeks with cefepime as well as Cipro with end date to be established.  Latest follow-up blood culture showed no growth.  Hemodialysis remains ongoing as per renal services.  Right IJ hemodialysis catheter  was removed 10/23/2019 with placement of new tunneled catheter 10/26/2019 for continued hemodialysis per renal services.  Noted on 10/27/2019 patient with altered mental status follow-up cranial CT scan completed that showed evidence of a new small frontal lobe white matter infarction new since MRI of 10/20/2019 near the left frontal horn.  No associated hemorrhage or mass-effect with follow-up neurology services recommending continued plan of care with ongoing Coumadin.  Patient did have some nonspecific back pain MRI of the left hip as well as lumbar spine no findings suspicious for osteomyelitis or septic arthritis.  There was some edema of the paraspinal and psoas muscles as well as osteodystrophy.  She was fitted with a TLSO back brace only for comfort.  Therapy evaluations have been completed and patient participating with therapies.  Patient was admitted for a comprehensive rehab program.  Review of Systems  Constitutional: Positive for fever.  HENT: Negative for hearing loss.   Eyes: Negative for blurred vision and double vision.  Respiratory: Positive for shortness of breath.   Cardiovascular: Positive for palpitations and leg swelling. Negative for chest pain.  Gastrointestinal: Positive for constipation. Negative for heartburn, nausea and vomiting.       GERD  Genitourinary: Negative for flank pain and hematuria.  Musculoskeletal: Positive for back pain.  Skin: Negative for rash.  Neurological: Positive for weakness.  All other systems reviewed and are negative.  Past Medical History:  Diagnosis Date   Arthritis    hands   Constipation    Diabetes mellitus without complication ([place])    Type II   ESRD (end stage renal disease) ([place])     M/W/F- Hemodialysis   GERD (gastroesophageal reflux disease)    Headache  Hyperlipidemia    Hypertension    Irregular heart rate    Stroke ([place])    TIA - approx 2010- no residual   Past Surgical History:  Procedure Laterality Date    ABDOMINAL HYSTERECTOMY     AORTIC ARCH ANGIOGRAPHY N/A 03/28/2019   Procedure: AORTIC ARCH ANGIOGRAPHY;  Surgeon: Waynetta Sandy, MD;  Location: [facility];  Service: Cardiovascular;  Laterality: N/A;   AV FISTULA PLACEMENT Left 06/13/2018   Procedure: ARTERIOVENOUS (AV) FISTULA CREATION;  Surgeon: Rosetta Posner, MD;  Location: [facility];  Service: Vascular;  Laterality: Left;   AV FISTULA PLACEMENT Left 08/18/2018   Procedure: INSERTION OF 4-7MM X 45CM ARTERIOVENOUS (AV) GORE-TEX GRAFT LEFT  FOREARM;  Surgeon: Rosetta Posner, MD;  Location: [facility];  Service: Vascular;  Laterality: Left;   AV FISTULA PLACEMENT Right 04/06/2019   Procedure: INSERTION OF ARTERIOVENOUS (AV) GORE-TEX GRAFT RIGHT UPPER ARM;  Surgeon: Waynetta Sandy, MD;  Location: [facility];  Service: Vascular;  Laterality: Right;   AV FISTULA PLACEMENT Right 04/13/2019   Procedure: INSERTION OF ARTERIOVENOUS (AV) BOVINE  ARTEGRAFT GRAFT RIGHT UPPER EXTREMITY;  Surgeon: Serafina Mitchell, MD;  Location: [facility];  Service: Vascular;  Laterality: Right;   [place] REMOVAL Right 05/16/2019   Procedure: REMOVAL OF ARTERIOVENOUS GORETEX GRAFT ([place]) RIGHT ARM;  Surgeon: Angelia Mould, MD;  Location: [facility];  Service: Vascular;  Laterality: Right;   [place] Right 03/07/2019   Procedure: First Stage Bascilic Vein Transposition Right Arm;  Surgeon: Serafina Mitchell, MD;  Location: [facility];  Service: Vascular;  Laterality: Right;   CATARACT EXTRACTION Right 2005   INSERTION OF DIALYSIS CATHETER N/A 08/18/2018   Procedure: INSERTION OF DIALYSIS CATHETER;  Surgeon: Rosetta Posner, MD;  [facility];  Service: Vascular;  Laterality: N/A;   INSERTION OF DIALYSIS CATHETER Right 07/25/2019   Procedure: INSERTION OF DIALYSIS CATHETER RIGHT INTERNAL JUGULAR (TUNNELED);  Surgeon: Virl Cagey, MD;  Location: [facility];  Service: General;  Laterality: Right;   IR FLUORO GUIDE CV LINE LEFT  10/26/2019   IR  FLUORO GUIDE CV LINE RIGHT  12/06/2018   IR PTA ADDL CENTRAL DIALYSIS SEG THRU DIALY CIRCUIT RIGHT Right 12/06/2018   IR REMOVAL TUN CV CATH W/O FL  10/23/2019   IR US GUIDE VASC ACCESS LEFT  10/26/2019   TEE WITHOUT CARDIOVERSION N/A 10/26/2019   Procedure: TRANSESOPHAGEAL ECHOCARDIOGRAM (TEE);  Surgeon: Lelon Perla, MD;  Location: [facility];  Service: Cardiovascular;  Laterality: N/A;   THROMBECTOMY AND REVISION OF ARTERIOVENTOUS (AV) GORETEX  GRAFT Left 09/29/2018   Procedure: INSERTION OF ARTERIOVENTOUS (AV) GORETEX  GRAFT ARM;  Surgeon: Waynetta Sandy, MD;  Location: [facility];  Service: Vascular;  Laterality: Left;   UPPER EXTREMITY ANGIOGRAPHY Right 03/28/2019   Procedure: UPPER EXTREMITY ANGIOGRAPHY;  Surgeon: Waynetta Sandy, MD;  Location: [facility];  Service: Cardiovascular;  Laterality: Right;   VENOGRAM  10/27/2018   Procedure: Venogram;  Surgeon: Marty Heck, MD;  Location: [facility];  Service: Cardiovascular;;  bilateral arm   Family History  Problem Relation Age of Onset   Heart murmur Mother    Heart failure Mother    Diabetes Mother    Cirrhosis Father    Alcoholism Father    Social History:  reports that she has never smoked. She has never used smokeless tobacco. She reports that she does not drink alcohol or use drugs. Allergies: No  Known Allergies Medications Prior to Admission  Medication Sig Dispense Refill   Accu-Chek Softclix Lancets lancets      Alcohol Swabs (B-D SINGLE USE SWABS REGULAR) PADS      amLODipine (NORVASC) 10 MG tablet Take 10 mg by mouth daily after lunch.      aspirin EC 81 MG tablet Take 81 mg by mouth daily after lunch.      calcium carbonate (TUMS - DOSED IN MG ELEMENTAL CALCIUM) 500 MG chewable tablet Chew 1 tablet by mouth 2 (two) times daily. With lunch & supper     carvedilol (COREG) 6.25 MG tablet Take 6.25 mg by mouth 2 (two) times daily.     clopidogrel (PLAVIX) 75 MG  tablet Take 1 tablet (75 mg total) by mouth daily. 30 tablet 11   fluticasone (FLONASE) 50 MCG/ACT nasal spray Place 1 spray into both nostrils daily as needed for allergies or rhinitis.      gabapentin (NEURONTIN) 100 MG capsule Take 1 capsule (100 mg total) by mouth at bedtime. 30 capsule 2   insulin NPH-regular Human (NOVOLIN 70/30) (70-30) 100 UNIT/ML injection Inject 10-20 Units into the skin See admin instructions. Use twice daily per sliding scale: Blood sugar less than 150=12 units & Blood sugar 200 or more=20 units     methocarbamol (ROBAXIN) 500 MG tablet Take 1 tablet by mouth 3 (three) times daily as needed.     multivitamin (RENA-VIT) TABS tablet Take 1 tablet by mouth daily after lunch.      pantoprazole (PROTONIX) 40 MG tablet Take 1 tablet by mouth daily as needed.      Polyethylene Glycol 3350 (PEG 3350) 17 GM/SCOOP POWD Take 17 g by mouth daily as needed.      traMADol (ULTRAM) 50 MG tablet Take 50 mg by mouth 4 (four) times daily as needed.     isosorbide mononitrate (IMDUR) 30 MG 24 hr tablet Take 30 mg by mouth daily after lunch.      predniSONE (DELTASONE) 20 MG tablet Take 20-40 mg by mouth as directed. Take 40mg  by mouth daily for 7 days, then takes 20mg  by mouth daily for 7 days, then stop      Drug Regimen Review Drug regimen was reviewed and remains appropriate with no significant issues identified.  Home: Home Living Family/patient expects to be discharged to:: Private residence Living Arrangements: Other relatives Available Help at Discharge: Family Type of Home: House Home Access: Stairs to enter Technical brewer of Steps: 3 Entrance Stairs-Rails: Right, Left, Can reach both Home Layout: One level [place] - single point Additional Comments: pt states that she lives with her brother, chart states that she lives with her husband?   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  Pt with heavy reliance on BUE support in sitting and total assistance to donn brace in sitting. Transfers Overall transfer level: Needs assistance Equipment used: Rolling walker (2 wheeled) Transfers: Sit to/from Stand Sit to Stand: Mod assist  Lateral/Scoot Transfers: Max assist, +2 physical assistance General transfer comment: Cues for hand  placement to and from seated surface.  Bed placed in elevated height for improved ease, poor eccentric load returning to chair due to weakness. Ambulation/Gait Ambulation/Gait assistance: Mod assist, +2 safety/equipment Gait Distance (Feet): 20 Feet Assistive device: Rolling walker (2 wheeled) Gait Pattern/deviations: Shuffle, Trunk flexed, Narrow base of support, Decreased stride length, Step-through pattern General Gait Details: Cues for upper trunk control and RW position.  Cues for B stride increase.  Close chair follow due to slow buckling last session.  Good progression of gt distance. Gait velocity: decreased    ADL: ADL Overall ADL's : Needs assistance/impaired Eating/Feeding: Set up, Bed level Eating/Feeding Details (indicate cue type and reason): eating sherbert upon arrival Grooming: Minimal assistance, Sitting Grooming Details (indicate cue type and reason): bed level Upper Body Bathing: Maximal assistance Lower Body Bathing: Total assistance Upper Body Dressing : Maximal assistance Lower Body Dressing: Total assistance Toilet Transfer: Maximal assistance, +2 for physical  assistance Toilet Transfer Details (indicate cue type and reason): pt unable to tolerate standing this session;poor balance sitting EOB Toileting- Clothing Manipulation and Hygiene: Total assistance General ADL Comments: deferred OOB mobility secondary to pt decreased balance sitting EOB and reports of increased lightheadedness;pt required maxA for sitting balance EOB  Cognition: Cognition Overall Cognitive Status: Within Functional Limits for tasks assessed Orientation Level: Oriented X4 Attention: Sustained Sustained Attention: Impaired Sustained Attention Impairment: Verbal basic, Functional basic Memory: Impaired Memory Impairment: Decreased short term memory Decreased Short Term Memory: Verbal basic, Functional basic Problem Solving: Impaired Problem Solving Impairment: Functional basic Safety/Judgment: Impaired Cognition Arousal/Alertness: Awake/alert Behavior During Therapy: WFL for tasks assessed/performed Overall Cognitive Status: Within Functional Limits for tasks assessed General Comments: Pt is a little slow to process but able to answer questions and follow commands during session.  Cognition not formally assessed.  Physical Exam: Blood pressure 137/71, pulse 89, temperature 98.9 F (37.2 C), temperature source Oral, resp. rate 18, height 5\' 7"  (1.702 m), weight 62 kg, SpO2 100 %.  Physical Exam   Gen: no distress, normal appearing HEENT: oral mucosa pink and moist, NCAT Cardio: Reg rate Chest: normal effort, normal rate of breathing Abd: soft, non-distended Ext: no edema Skin: intact Neurological: 4+/5 strength throughout. Sensation intact.  Patient is sitting up in bed.  She is a bit lethargic but easily arousable.  Makes good eye contact with examiner follows full commands.  She provides her name and age.  Fair insight to her deficits.  Musculoskeletal: Psych: pleasant, normal affect  Results for orders placed or performed during the hospital encounter of  10/20/19 (from the past 48 hour(s))  Glucose, capillary     Status: Abnormal   Collection Time: 10/30/19  6:43 AM  Result Value Ref Range   Glucose-Capillary 182 (H) 70 - 99 mg/dL  Glucose, capillary     Status: Abnormal   Collection Time: 10/30/19 11:51 AM  Result Value Ref Range   Glucose-Capillary 205 (H) 70 - 99 mg/dL   Comment 1 Notify RN    Comment 2 Document in Chart   Glucose, capillary     Status: Abnormal   Collection Time: 10/30/19  9:25 PM  Result Value Ref Range   Glucose-Capillary 236 (H) 70 - 99 mg/dL   Comment 1 Notify RN   Renal function panel     Status: Abnormal   Collection Time: 10/31/19  3:58 AM  Result Value Ref Range   Sodium 133 (L) 135 - 145 mmol/L   Potassium 3.8 3.5 - 5.1 mmol/L   Chloride 95 (  L) 98 - 111 mmol/L   CO2 26 22 - 32 mmol/L   Glucose, Bld 166 (H) 70 - 99 mg/dL   BUN 13 8 - 23 mg/dL   Creatinine, Ser 3.41 (H) 0.44 - 1.00 mg/dL    Comment: DELTA CHECK NOTED   Calcium 8.4 (L) 8.9 - 10.3 mg/dL   Phosphorus 3.3 2.5 - 4.6 mg/dL   Albumin 1.6 (L) 3.5 - 5.0 g/dL   GFR calc non Af Amer 13 (L) >60 mL/min   GFR calc Af Amer 15 (L) >60 mL/min   Anion gap 12 5 - 15    Comment: Performed at [facility] [address], [place], [place] [address]  Heparin level (unfractionated)     Status: None   Collection Time: 10/31/19  3:58 AM  Result Value Ref Range   Heparin Unfractionated 0.68 0.30 - 0.70 IU/mL    Comment: (NOTE) If heparin results are below expected values, and patient dosage has  been confirmed, suggest follow up testing of antithrombin III levels. Performed at [facility], [place] [address], [place], [place] [address]   CBC     Status: Abnormal   Collection Time: 10/31/19  3:58 AM  Result Value Ref Range   WBC 10.9 (H) 4.0 - 10.5 K/uL   RBC 2.79 (L) 3.87 - 5.11 MIL/uL   Hemoglobin 8.1 (L) 12.0 - 15.0 g/dL   HCT 25.6 (L) 36.0 - 46.0 %   MCV 91.8 80.0 - 100.0 fL   MCH 29.0 26.0 - 34.0 pg   MCHC 31.6 30.0 - 36.0 g/dL    RDW 15.7 (H) 11.5 - 15.5 %   Platelets 197 150 - 400 K/uL   nRBC 0.0 0.0 - 0.2 %    Comment: Performed at [facility], [place] [address], [place], [place] [address]  Protime-INR     Status: Abnormal   Collection Time: 10/31/19  3:58 AM  Result Value Ref Range   Prothrombin Time 29.3 (H) 11.4 - 15.2 seconds   INR 2.8 (H) 0.8 - 1.2    Comment: (NOTE) INR goal varies based on device and disease states. Performed at [facility], [place] [address] [place], [place] [address]   Glucose, capillary     Status: Abnormal   Collection Time: 10/31/19  6:15 AM  Result Value Ref Range   Glucose-Capillary 146 (H) 70 - 99 mg/dL  Glucose, capillary     Status: Abnormal   Collection Time: 10/31/19 11:53 AM  Result Value Ref Range   Glucose-Capillary 189 (H) 70 - 99 mg/dL   Comment 1 Notify RN    Comment 2 Document in Chart   Glucose, capillary     Status: Abnormal   Collection Time: 10/31/19  3:57 PM  Result Value Ref Range   Glucose-Capillary 182 (H) 70 - 99 mg/dL   Comment 1 Notify RN    Comment 2 Document in Chart   Glucose, capillary     Status: Abnormal   Collection Time: 10/31/19  9:23 PM  Result Value Ref Range   Glucose-Capillary 170 (H) 70 - 99 mg/dL   Comment 1 Notify RN   CBC     Status: Abnormal   Collection Time: 11/01/19  4:21 AM  Result Value Ref Range   WBC 8.7 4.0 - 10.5 K/uL   RBC 2.69 (L) 3.87 - 5.11 MIL/uL   Hemoglobin 7.7 (L) 12.0 - 15.0 g/dL   HCT 24.6 (L) 36.0 - 46.0 %  MCV 91.4 80.0 - 100.0 fL   MCH 28.6 26.0 - 34.0 pg   MCHC 31.3 30.0 - 36.0 g/dL   RDW 15.9 (H) 11.5 - 15.5 %   Platelets 206 150 - 400 K/uL   nRBC 0.0 0.0 - 0.2 %    Comment: Performed at [facility] [address] [place], [place] [address]  Protime-INR     Status: Abnormal   Collection Time: 11/01/19  4:21 AM  Result Value Ref Range   Prothrombin Time 28.0 (H) 11.4 - 15.2 seconds   INR 2.6 (H) 0.8 - 1.2    Comment: (NOTE) INR goal varies based on device and disease  states. Performed at [facility], [place] [address], [place], [place] [id number]   Basic metabolic panel     Status: Abnormal   Collection Time: 11/01/19  4:21 AM  Result Value Ref Range   Sodium 136 135 - 145 mmol/L   Potassium 4.7 3.5 - 5.1 mmol/L   Chloride 96 (L) 98 - 111 mmol/L   CO2 27 22 - 32 mmol/L   Glucose, Bld 158 (H) 70 - 99 mg/dL   BUN 31 (H) 8 - 23 mg/dL   Creatinine, Ser 5.59 (H) 0.44 - 1.00 mg/dL    Comment: DELTA CHECK NOTED   Calcium 8.9 8.9 - 10.3 mg/dL   GFR calc non Af Amer 7 (L) >60 mL/min   GFR calc Af Amer 8 (L) >60 mL/min   Anion gap 13 5 - 15    Comment: Performed at [facility] [address], [place], [place] [address]   No results found.     Medical Problem List and Plan: 1.  Impaired gait with left side weakness secondary to right basal ganglia infarction as well as small cortical infarct left frontal lobe  -patient may shower  -ELOS/Goals: modI in PT, OT, I in SLP 2.  Antithrombotics: -DVT/anticoagulation: Chronic Coumadin for right IJ acute DVT.  Plan anticoagulation x3 months  -antiplatelet therapy: N/A 3. Pain Management chronic back and left hip pain: Neurontin 100 mg nightly, oxycodone as needed.  TLSO back brace for comfort 4. Mood: Provide emotional support  -antipsychotic agents: N/A 5. Neuropsych: This patient is capable of making decisions on her own behalf. 6. Skin/Wound Care: Routine skin checks 7. Fluids/Electrolytes/Nutrition: Routine in and outs with follow-up chemistries 8.  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  New HD tunneled catheter placed 10/26/2019. 9.  ID/recurrent Pseudomonas bacteremia.  Continue Maxipime and Cipro as directed with infectious disease to discuss duration of antibiotic therapies anticipate 6 weeks. 10.  Hemodialysis MWF.  Follow-up per renal services 11.  Diabetes mellitus.  Hemoglobin A1c 7.6.  SSI. 12.   Hypertension.  Imdur 30 mg daily, Coreg 6.25 mg twice daily.  Monitor with increased mobility 13.  Acute on chronic anemia.  Follow-up CBC 14. Disposition: Upon discharge, patient should have follow-up with PCP, physiatry, infectious disease, and neurology.   Lavon Paganini Angiulli, PA-C 11/01/2019   I have personally performed a face to face diagnostic evaluation, including, but not limited to relevant history and physical exam findings, of this patient and developed relevant assessment and plan.  Additionally, I have reviewed and concur with the physician assistant's documentation above.  Leeroy Cha, MD

## 2019-11-01 NOTE — Progress Notes (Signed)
Physical Therapy Treatment Patient Details Name: [name] MRN: [id number] DOB: 1951/02/28 Today's Date: 11/01/2019    History of Present Illness [name] is a 68 y.o. female with history of ESRD ( on dialysis M-W-F), TIA 2010, HTN, HLD, Ha, DM2 who presented to  [facility] with c/o dizziness, left arm weakness and left leg numbness. MRI revealed acute R basal ganglia infarct. In addition, she has R IJ DVT and spiked a fever 11/27 with concern for sepsis.     PT Comments    Pt performed limited mobility during session this pm.  Pt reports continued pain after pre med and presents more lethargic post dialysis.  She required increased assistance but did ambulate to the sink to perfom ADLs.  Pt continues to benefit from aggressive rehab in a post acute setting.  Plan to d/c to CIR today.     Follow Up Recommendations  CIR;Supervision/Assistance - 24 hour     Equipment Recommendations  None recommended by PT    Recommendations for Other Services       Precautions / Restrictions Precautions Precautions: Fall Required Braces or Orthoses: Spinal Brace Spinal Brace: Thoracolumbosacral orthotic;Other (comment) Spinal Brace Comments: for comfort with mobility. Restrictions Weight Bearing Restrictions: No    Mobility  Bed Mobility Overal bed mobility: Needs Assistance Bed Mobility: Rolling Rolling: Mod assist Sidelying to sit: Mod assist Supine to sit: Mod assist Sit to supine: Max assist Sit to sidelying: Mod assist General bed mobility comments: Pt needing increased assistance this session secondary to pain with movement  Transfers Overall transfer level: Needs assistance Equipment used: Rolling walker (2 wheeled) Transfers: Sit to/from Stand Sit to Stand: Mod assist;+2 physical assistance         General transfer comment: lifting assistance to stand from EOB and cuing for hand placement.  Ambulation/Gait Ambulation/Gait assistance: Mod  assist;+2 physical assistance Gait Distance (Feet): 8 Feet(from bed to sink and then requested to sit.) Assistive device: Rolling walker (2 wheeled) Gait Pattern/deviations: Shuffle;Trunk flexed;Narrow base of support;Decreased stride length;Step-through pattern Gait velocity: decreased   General Gait Details: Pt continues to require cues for upper trunk control and B stride length.  She fatigues quickly this session due to pain.  Pt also fatigued from dialysis session.   Stairs             Wheelchair Mobility    Modified Rankin (Stroke Patients Only) Modified Rankin (Stroke Patients Only) Pre-Morbid Rankin Score: No symptoms Modified Rankin: Moderately severe disability     Balance Overall balance assessment: Needs assistance Sitting-balance support: Feet supported;Bilateral upper extremity supported Sitting balance-Leahy Scale: Poor Sitting balance - Comments: heavy UE support on EOB   Standing balance support: Bilateral upper extremity supported;During functional activity Standing balance-Leahy Scale: Poor Standing balance comment: Heavy reliance on RW in standing and physical assistance needed                            Cognition Arousal/Alertness: Awake/alert Behavior During Therapy: WFL for tasks assessed/performed Overall Cognitive Status: Within Functional Limits for tasks assessed                                 General Comments: Pt reports feeling painful, pain meds on board but very resistive to movement initially but as tx progressed she quickly wanted to return back to bed.      Exercises  General Comments        Pertinent Vitals/Pain Pain Assessment: Faces Pain Score: 5  Faces Pain Scale: Hurts whole lot Pain Location: lower back Pain Descriptors / Indicators: Grimacing;Moaning;Aching Pain Intervention(s): Monitored during session;Repositioned    Home Living                      Prior Function             PT Goals (current goals can now be found in the care plan section) Acute Rehab PT Goals Patient Stated Goal: to go to rehab Potential to Achieve Goals: Fair Progress towards PT goals: Progressing toward goals    Frequency    Min 4X/week      PT Plan Current plan remains appropriate    Co-evaluation PT/OT/SLP Co-Evaluation/Treatment: Yes Reason for Co-Treatment: Complexity of the patient's impairments (multi-system involvement);For patient/therapist safety PT goals addressed during session: Mobility/safety with mobility OT goals addressed during session: ADL's and self-care      AM-PAC PT "6 Clicks" Mobility   Outcome Measure  Help needed turning from your back to your side while in a flat bed without using bedrails?: A Little Help needed moving from lying on your back to sitting on the side of a flat bed without using bedrails?: A Lot Help needed moving to and from a bed to a chair (including a wheelchair)?: A Lot Help needed standing up from a chair using your arms (e.g., wheelchair or bedside chair)?: A Lot Help needed to walk in hospital room?: A Lot Help needed climbing 3-5 steps with a railing? : A Lot 6 Click Score: 13    End of Session Equipment Utilized During Treatment: Gait belt;Back brace Activity Tolerance: Patient limited by pain Patient left: in chair;with call bell/phone within reach;with family/visitor present Nurse Communication: Mobility status PT Visit Diagnosis: Muscle weakness (generalized) (M62.81);Other abnormalities of gait and mobility (R26.89);Pain Pain - Right/Left: Left Pain - part of body: Hip     Time: [id number] PT Time Calculation (min) (ACUTE ONLY): 32 min  Charges:  $Gait Training: 8-22 mins                     Erasmo Leventhal , PTA Acute Rehabilitation Services Pager [phone] Office [phone]     Josphine Laffey Eli Hose 11/01/2019, 4:33 PM

## 2019-11-01 NOTE — Progress Notes (Signed)
ANTICOAGULATION CONSULT NOTE - Follow-Up  Pharmacy Consult for Transitioning From Heparin to Warfarin Indication: RIJ DVT  No Known Allergies  Patient Measurements: Height: 5\' 7"  (170.2 cm) Weight: 138 lb 0.1 oz (62.6 kg) IBW/kg (Calculated) : 61.6 Heparin Dosing Weight: 63 kg  Vital Signs: Temp: 98 F (36.7 C) (12/09 0740) Temp Source: Oral (12/09 0740) BP: 149/80 (12/09 0845) Pulse Rate: 85 (12/09 0845)  Labs: Recent Labs    10/29/19 1702  10/30/19 0438 10/31/19 0358 11/01/19 0421  HGB  --    < > 8.3* 8.1* 7.7*  HCT  --   --  26.2* 25.6* 24.6*  PLT  --   --  205 197 206  LABPROT  --   --  24.8* 29.3* 28.0*  INR  --   --  2.3* 2.8* 2.6*  HEPARINUNFRC 0.44  --  0.34 0.68  --   CREATININE  --   --  5.81* 3.41* 5.59*   < > = values in this interval not displayed.    Estimated Creatinine Clearance: 9.2 mL/min (A) (by C-G formula based on SCr of 5.59 mg/dL (H)).  Assessment: 68 yr old female with ESRD who presented on 11/27 with new CVA and is now found to have RIJ DVT and pharmacy consulted to start heparin anticoagulation - low goal and no bolus with concurrent CVA. Pt rec'd new tunneled HD catheter on 12/3 for continuation of HD.  Warfarin started 12/4. On Cipro + Cefepime dual coverage for endocarditis x6 weeks - noted drug interaction with Warfarin.   INR therapeutic at 2.6- stable after low dose given yesterday. H/H 7.7/24.6, plts stable.  Goal of Therapy:  Heparin level 0.3-0.5 units/ml  INR 2.0-3.0 Monitor platelets by anticoagulation protocol: Yes   Plan:  Warfarin 2.5mg  po x1 tonight If discharging, recommend 2.5mg  po daily with INR check on or by Friday if possible due to drug interactions with antibiotics.  If remains inpatient, daily INR and monitor for bleeding.  Sloan Leiter, PharmD, BCPS, BCCCP Clinical Pharmacist Please refer to [facility] for [place] numbers 11/01/2019 9:52 AM

## 2019-11-01 NOTE — Procedures (Signed)
Patient seen on Hemodialysis. BP (!) 149/80 (BP Location: Right Arm)   Pulse 85   Temp 98 F (36.7 C) (Oral)   Resp 15   Ht 5\' 7"  (1.702 m)   Wt 62.6 kg   SpO2 100%   BMI 21.62 kg/m   QB 400, UF goal 2L Tolerating treatment without complaints at this time.   Elmarie Shiley MD [facility]. Office # [phone] Pager # [phone] 9:40 AM

## 2019-11-01 NOTE — Progress Notes (Signed)
Diana Ribas, MD  Physician  Physical Medicine and Rehabilitation  Consult Note  Signed  Date of Service:  10/23/2019  5:21 AM      Related encounter: ED to Hosp-Admission (Discharged) from 10/20/2019 in [facility]      Signed      Expand All Collapse All            Physical Medicine and Rehabilitation Consult Reason for Consult: Left side weakness Referring Physician: [facility]     HPI: [name] is a 68 y.o. right-handed female with history of end-stage renal disease with hemodialysis Monday Wednesday Fridays, diabetes mellitus, TIA 2010 maintained on aspirin and Plavix, hypertension, hyperlipidemia.  Per chart review patient lives with her brother.  Independent prior to admission.  Presented 10/20/2019 with left-sided weakness and difficulty with ambulation.  Her last dialysis was 10/18/2019.  CT of the brain showed acute right basal ganglia infarction.  Patient did not receive TPA.  Admission chemistries with creatinine 7.76, BUN 56, WBC 15,700, hemoglobin 9.7, SARS coronavirus negative.  MRI/MRA showed acute right basal ganglia infarction chronic ischemia with multiple old infarcts.  Negative head MRA.  Carotid Dopplers unremarkable no PFO.  Echocardiogram with ejection fraction of 65% without emboli.  Hospital course complicated by low-grade fever of 101 findings of right IJ DVT on Dopplers.  Patient was placed on IV heparin and follow-up per neurology services.  Tolerating a regular consistency diet.  Hemodialysis ongoing as per renal services.  Therapy evaluations completed with recommendations of physical medicine rehab consult.     Review of Systems  Constitutional: Positive for fever. Negative for chills.  HENT: Negative for hearing loss.   Eyes: Negative for blurred vision and double vision.  Respiratory: Negative for cough and shortness of breath.   Cardiovascular: Positive for palpitations and leg swelling.  Gastrointestinal: Positive  for constipation. Negative for heartburn, nausea and vomiting.       GERD  Genitourinary: Negative for dysuria, flank pain and hematuria.  Musculoskeletal: Positive for joint pain and myalgias.  Neurological: Positive for focal weakness and headaches.  All other systems reviewed and are negative.       Past Medical History:  Diagnosis Date   Arthritis      hands   Constipation     Diabetes mellitus without complication (HCC)      Type II   ESRD (end stage renal disease) ([place])       M/W/F- Hemodialysis   GERD (gastroesophageal reflux disease)     Headache     Hyperlipidemia     Hypertension     Irregular heart rate     Stroke ([place])      TIA - approx 2010- no residual         Past Surgical History:  Procedure Laterality Date   ABDOMINAL HYSTERECTOMY       AORTIC ARCH ANGIOGRAPHY N/A 03/28/2019    Procedure: AORTIC ARCH ANGIOGRAPHY;  Surgeon: Waynetta Sandy, MD;  Location: [facility];  Service: Cardiovascular;  Laterality: N/A;   AV FISTULA PLACEMENT Left 06/13/2018    Procedure: ARTERIOVENOUS (AV) FISTULA CREATION;  Surgeon: Rosetta Posner, MD;  Location: [facility];  Service: Vascular;  Laterality: Left;   AV FISTULA PLACEMENT Left 08/18/2018    Procedure: INSERTION OF 4-7MM X 45CM ARTERIOVENOUS (AV) GORE-TEX GRAFT LEFT  FOREARM;  Surgeon: Rosetta Posner, MD;  Location: [facility];  Service: Vascular;  Laterality: Left;   AV FISTULA PLACEMENT Right 04/06/2019  Procedure: INSERTION OF ARTERIOVENOUS (AV) GORE-TEX GRAFT RIGHT UPPER ARM;  Surgeon: Waynetta Sandy, MD;  Location: [facility];  Service: Vascular;  Laterality: Right;   AV FISTULA PLACEMENT Right 04/13/2019    Procedure: INSERTION OF ARTERIOVENOUS (AV) BOVINE  ARTEGRAFT GRAFT RIGHT UPPER EXTREMITY;  Surgeon: Serafina Mitchell, MD;  Location: [facility];  Service: Vascular;  Laterality: Right;   [place] REMOVAL Right 05/16/2019    Procedure: REMOVAL OF ARTERIOVENOUS GORETEX GRAFT ([place]) RIGHT ARM;   Surgeon: Angelia Mould, MD;  Location: [facility];  Service: Vascular;  Laterality: Right;   [place] Right 03/07/2019    Procedure: First Stage Bascilic Vein Transposition Right Arm;  Surgeon: Serafina Mitchell, MD;  Location: [facility];  Service: Vascular;  Laterality: Right;   CATARACT EXTRACTION Right 2005   INSERTION OF DIALYSIS CATHETER N/A 08/18/2018    Procedure: INSERTION OF DIALYSIS CATHETER;  Surgeon: Rosetta Posner, MD;  Location: [facility];  Service: Vascular;  Laterality: N/A;   INSERTION OF DIALYSIS CATHETER Right 07/25/2019    Procedure: INSERTION OF DIALYSIS CATHETER RIGHT INTERNAL JUGULAR (TUNNELED);  Surgeon: Virl Cagey, MD;  Location: [facility];  Service: General;  Laterality: Right;   IR FLUORO GUIDE CV LINE RIGHT   12/06/2018   IR PTA ADDL CENTRAL DIALYSIS SEG THRU DIALY CIRCUIT RIGHT Right 12/06/2018   THROMBECTOMY AND REVISION OF ARTERIOVENTOUS (AV) GORETEX  GRAFT Left 09/29/2018    Procedure: INSERTION OF ARTERIOVENTOUS (AV) GORETEX  GRAFT ARM;  Surgeon: Waynetta Sandy, MD;  Location: [facility];  Service: Vascular;  Laterality: Left;   UPPER EXTREMITY ANGIOGRAPHY Right 03/28/2019    Procedure: UPPER EXTREMITY ANGIOGRAPHY;  Surgeon: Waynetta Sandy, MD;  Location: [facility];  Service: Cardiovascular;  Laterality: Right;   VENOGRAM   10/27/2018    Procedure: Venogram;  Surgeon: Marty Heck, MD;  Location: [facility];  Service: Cardiovascular;;  bilateral arm         Family History  Problem Relation Age of Onset   Heart murmur Mother     Heart failure Mother     Diabetes Mother     Cirrhosis Father     Alcoholism Father      Social History:  reports that she has never smoked. She has never used smokeless tobacco. She reports that she does not drink alcohol or use drugs. Allergies: No Known Allergies       Medications Prior to Admission  Medication Sig Dispense Refill   Accu-Chek Softclix Lancets  lancets         Alcohol Swabs (B-D SINGLE USE SWABS REGULAR) PADS         amLODipine (NORVASC) 10 MG tablet Take 10 mg by mouth daily after lunch.        aspirin EC 81 MG tablet Take 81 mg by mouth daily after lunch.        calcium carbonate (TUMS - DOSED IN MG ELEMENTAL CALCIUM) 500 MG chewable tablet Chew 1 tablet by mouth 2 (two) times daily. With lunch & supper       carvedilol (COREG) 6.25 MG tablet Take 6.25 mg by mouth 2 (two) times daily.       clopidogrel (PLAVIX) 75 MG tablet Take 1 tablet (75 mg total) by mouth daily. 30 tablet 11   fluticasone (FLONASE) 50 MCG/ACT nasal spray Place 1 spray into both nostrils daily as needed for allergies or rhinitis.        gabapentin (NEURONTIN)  100 MG capsule Take 1 capsule (100 mg total) by mouth at bedtime. 30 capsule 2   insulin NPH-regular Human (NOVOLIN 70/30) (70-30) 100 UNIT/ML injection Inject 10-20 Units into the skin See admin instructions. Use twice daily per sliding scale: Blood sugar less than 150=12 units & Blood sugar 200 or more=20 units       methocarbamol (ROBAXIN) 500 MG tablet Take 1 tablet by mouth 3 (three) times daily as needed.       multivitamin (RENA-VIT) TABS tablet Take 1 tablet by mouth daily after lunch.        pantoprazole (PROTONIX) 40 MG tablet Take 1 tablet by mouth daily as needed.        Polyethylene Glycol 3350 (PEG 3350) 17 GM/SCOOP POWD Take 17 g by mouth daily as needed.        traMADol (ULTRAM) 50 MG tablet Take 50 mg by mouth 4 (four) times daily as needed.       isosorbide mononitrate (IMDUR) 30 MG 24 hr tablet Take 30 mg by mouth daily after lunch.        predniSONE (DELTASONE) 20 MG tablet Take 20-40 mg by mouth as directed. Take 40mg  by mouth daily for 7 days, then takes 20mg  by mouth daily for 7 days, then stop          Home: [place] expects to be discharged to:: Private residence Living Arrangements: Other relatives Available Help at Discharge: Family Type of  Home: House Home Access: Stairs to enter Technical brewer of Steps: 3 Entrance Stairs-Rails: Right, Left, Can reach both Home Layout: One level Home Equipment: [place] - single point Additional Comments: pt states that she lives with her brother, chart states that she lives with her husband?  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 Initiated sit but then experienced LBP and fell bkwds with control given from behind. Mod A to sit up from this position. Max A +2 for return to supine Transfers Overall transfer level: Needs assistance Equipment used: None Transfers: Lateral/Scoot Transfers, Sit to/from Stand Sit to Stand: +2 physical assistance, Max assist  Lateral/Scoot Transfers: Max assist, +2 physical assistance General transfer comment: pt attempted to stand EOB but was unable with max A +2 due to weakness, LLE>RLE. Scooted to R to [facility] and was able to push with feet but still needed max A +2 to actually move hips Ambulation/Gait General Gait Details: unable   ADL:   Cognition: Cognition Overall Cognitive Status: No family/caregiver present to determine baseline cognitive functioning Orientation Level: Oriented X4 Cognition Arousal/Alertness: Lethargic Behavior During Therapy: WFL for tasks assessed/performed Overall Cognitive Status: No family/caregiver present to determine baseline cognitive functioning General Comments: lethargic and question accuracy of her history   Blood pressure 126/82, pulse (!) 108, temperature 99.2 F (37.3 C), temperature source Oral, resp. rate 16, height 5\' 7"  (1.702 m), weight  63.3 kg, SpO2 99 %.   Physical Exam  Neurological:  Patient bit lethargic but arousable.  Follows commands provides her name and age.  Fair awareness of deficits . Gen: no distress, normal appearing HEENT: oral mucosa pink and moist, NCAT Cardio: Reg rate Chest: normal effort, normal rate of  breathing Abd: soft, non-distended Ext: no edema Skin: intact Musculoskeletal: RUE: 5/5 throughout RLE: 5/5 throughout LUE: 4/5 throughout LLE: 3/5 HF, 4/5 KEE, DF, and PF Sensation intact.  Psych: pleasant, normal affect   Lab Results Last 24 Hours        Results for orders placed or performed during the hospital encounter of 10/20/19 (from the past 24 hour(s))  Glucose, capillary     Status: Abnormal    Collection Time: 10/22/19  6:01 AM  Result Value Ref Range    Glucose-Capillary 143 (H) 70 - 99 mg/dL    Comment 1 Notify RN    Renal function panel     Status: Abnormal    Collection Time: 10/22/19  7:35 AM  Result Value Ref Range    Sodium 138 135 - 145 mmol/L    Potassium 4.8 3.5 - 5.1 mmol/L    Chloride 98 98 - 111 mmol/L    CO2 22 22 - 32 mmol/L    Glucose, Bld 148 (H) 70 - 99 mg/dL    BUN 74 (H) 8 - 23 mg/dL    Creatinine, Ser 10.27 (H) 0.44 - 1.00 mg/dL    Calcium 8.5 (L) 8.9 - 10.3 mg/dL    Phosphorus 4.3 2.5 - 4.6 mg/dL    Albumin 2.3 (L) 3.5 - 5.0 g/dL    GFR calc non Af Amer 3 (L) >60 mL/min    GFR calc Af Amer 4 (L) >60 mL/min    Anion gap 18 (H) 5 - 15  CBC     Status: Abnormal    Collection Time: 10/22/19  7:35 AM  Result Value Ref Range    WBC 13.3 (H) 4.0 - 10.5 K/uL    RBC 3.40 (L) 3.87 - 5.11 MIL/uL    Hemoglobin 9.6 (L) 12.0 - 15.0 g/dL    HCT 30.0 (L) 36.0 - 46.0 %    MCV 88.2 80.0 - 100.0 fL    MCH 28.2 26.0 - 34.0 pg    MCHC 32.0 30.0 - 36.0 g/dL    RDW 17.5 (H) 11.5 - 15.5 %    Platelets 274 150 - 400 K/uL    nRBC 0.0 0.0 - 0.2 %  Culture, blood (routine x 2)     Status: None (Preliminary result)    Collection Time: 10/22/19  7:35 AM    Specimen:  BLOOD  Result Value Ref Range    Specimen Description BLOOD RIGHT ANTECUBITAL      Special Requests          BOTTLES DRAWN AEROBIC ONLY Blood Culture adequate volume    Culture          NO GROWTH < 12 HOURS Performed at [facility], [address] [place], [place] [address]      Report Status PENDING    Culture, blood (routine x 2)     Status: None (Preliminary result)    Collection Time: 10/22/19  7:43 AM    Specimen: BLOOD LEFT HAND  Result Value Ref Range    Specimen Description BLOOD LEFT HAND      Special Requests          BOTTLES DRAWN AEROBIC ONLY Blood Culture adequate volume    Culture          NO GROWTH < 12 HOURS Performed at [facility], [place] [address], [place], [place] [address]      Report Status PENDING    Glucose, capillary  Status: None    Collection Time: 10/22/19 11:40 AM  Result Value Ref Range    Glucose-Capillary 81 70 - 99 mg/dL  Glucose, capillary     Status: Abnormal    Collection Time: 10/22/19  5:48 PM  Result Value Ref Range    Glucose-Capillary 209 (H) 70 - 99 mg/dL    Comment 1 Notify RN      Comment 2 Document in Chart    Glucose, capillary     Status: Abnormal    Collection Time: 10/22/19  9:46 PM  Result Value Ref Range    Glucose-Capillary 198 (H) 70 - 99 mg/dL    Comment 1 Notify RN    Heparin level (unfractionated)     Status: None    Collection Time: 10/22/19 10:28 PM  Result Value Ref Range    Heparin Unfractionated 0.33 0.30 - 0.70 IU/mL      Imaging Results (Last 48 hours)  Vas [name] Transcranial Doppler W Bubbles   Result Date: 10/22/2019  Transcranial Doppler with Bubble Indications: Stroke. Comparison Study: No prior study. Performing Technologist: Maudry Mayhew MHA, RDMS, RVT, RDCS  Examination Guidelines: A complete evaluation includes B-mode imaging, spectral Doppler, color Doppler, and power Doppler as needed of all accessible portions of each vessel. Bilateral testing is considered an integral part of  a complete examination. Limited examinations for reoccurring indications may be performed as noted.  Summary:  A vascular evaluation was performed. The right ICA siphon was studied. An IV was inserted into the patient's right forearm. Verbal informed consent was obtained.  No obvious evidence of high intensity transient signals (HITS), therefore no evidence of clinically significant patent foramen ovale (PFO). *See table(s) above for measurements and observations.    Preliminary     Vas US Carotid   Result Date: 10/21/2019 Carotid Arterial Duplex Study Indications:       CVA and Weakness. Risk Factors:      Hypertension, hyperlipidemia, Diabetes, prior CVA. Other Factors:     ESRD. Comparison Study:  No prior study on file for comparison Performing Technologist: Maudry Mayhew RDMS, RVT, RDCS  Examination Guidelines: A complete evaluation includes B-mode imaging, spectral Doppler, color Doppler, and power Doppler as needed of all accessible portions of each vessel. Bilateral testing is considered an integral part of a complete examination. Limited examinations for reoccurring indications may be performed as noted.  Right Carotid Findings: +----------+--------+--------+--------+------------------+------------------+             PSV cm/s EDV cm/s Stenosis Plaque Description Comments            +----------+--------+--------+--------+------------------+------------------+  CCA Prox   78       14                                   intimal thickening  +----------+--------+--------+--------+------------------+------------------+  CCA Distal 70       11                                   intimal thickening  +----------+--------+--------+--------+------------------+------------------+  ICA Prox   54       13                calcific           Shadowing           +----------+--------+--------+--------+------------------+------------------+  ICA Distal 79  24                                                        +----------+--------+--------+--------+------------------+------------------+  ECA        74       4                                                        +----------+--------+--------+--------+------------------+------------------+ +----------+--------+-------+--------+-------------------+             PSV cm/s EDV cms Describe Arm Pressure (mmHG)  +----------+--------+-------+--------+-------------------+  Subclavian 100                                            +----------+--------+-------+--------+-------------------+ +---------+--------+--+--------+--+  Vertebral PSV cm/s 62 EDV cm/s 10  +---------+--------+--+--------+--+  Left Carotid Findings: +----------+--------+--------+--------+------------------+------------------+             PSV cm/s EDV cm/s Stenosis Plaque Description Comments            +----------+--------+--------+--------+------------------+------------------+  CCA Prox   111      9                                    intimal thickening  +----------+--------+--------+--------+------------------+------------------+  CCA Distal 76       12                                   intimal thickening  +----------+--------+--------+--------+------------------+------------------+  ICA Prox   55       15                heterogenous       Shadowing           +----------+--------+--------+--------+------------------+------------------+  ICA Distal 97       27                                                       +----------+--------+--------+--------+------------------+------------------+  ECA        67       9                                                        +----------+--------+--------+--------+------------------+------------------+ +----------+--------+--------+--------+-------------------+             PSV cm/s EDV cm/s Describe Arm Pressure (mmHG)  +----------+--------+--------+--------+-------------------+  Subclavian 145                                              +----------+--------+--------+--------+-------------------+ +---------+--------+--+--------+--+  Vertebral PSV cm/s 38 EDV cm/s 10  +---------+--------+--+--------+--+  Summary: Right Carotid: Velocities in the right ICA are consistent with a 1-39% stenosis. Left Carotid: Velocities in the left ICA are consistent with a 1-39% stenosis. Vertebrals:  Bilateral vertebral arteries demonstrate antegrade flow. Subclavians: Normal flow hemodynamics were seen in bilateral subclavian              arteries. *See table(s) above for measurements and observations.     Preliminary     Vas [name] Lower Extremity Venous (dvt)   Result Date: 10/22/2019  Lower Venous Study Indications: Stroke.  Limitations: Body habitus. Comparison Study: No prior study on file for comparison Performing Technologist: Maudry Mayhew RDMS, RVT, RDCS  Examination Guidelines: A complete evaluation includes B-mode imaging, spectral Doppler, color Doppler, and power Doppler as needed of all accessible portions of each vessel. Bilateral testing is considered an integral part of a complete examination. Limited examinations for reoccurring indications may be performed as noted.  +---------+---------------+---------+-----------+----------+--------------+  RIGHT     Compressibility Phasicity Spontaneity Properties Thrombus Aging  +---------+---------------+---------+-----------+----------+--------------+  CFV       Full            Yes       Yes                                    +---------+---------------+---------+-----------+----------+--------------+  SFJ       Full                                                             +---------+---------------+---------+-----------+----------+--------------+  FV Prox   Full                                                             +---------+---------------+---------+-----------+----------+--------------+  FV Mid    Full                                                              +---------+---------------+---------+-----------+----------+--------------+  FV Distal Full                                                             +---------+---------------+---------+-----------+----------+--------------+  PFV       Full                                                             +---------+---------------+---------+-----------+----------+--------------+  POP       Full  No        No                                     +---------+---------------+---------+-----------+----------+--------------+  PTV       Full                                                             +---------+---------------+---------+-----------+----------+--------------+  PERO      Full                                                             +---------+---------------+---------+-----------+----------+--------------+   +---------+---------------+---------+-----------+----------+--------------+  LEFT      Compressibility Phasicity Spontaneity Properties Thrombus Aging  +---------+---------------+---------+-----------+----------+--------------+  CFV       Full            Yes       Yes                                    +---------+---------------+---------+-----------+----------+--------------+  SFJ       Full                                                             +---------+---------------+---------+-----------+----------+--------------+  FV Prox   Full                                                             +---------+---------------+---------+-----------+----------+--------------+  FV Mid    Full                                                             +---------+---------------+---------+-----------+----------+--------------+  FV Distal Full                                                             +---------+---------------+---------+-----------+----------+--------------+  PFV       Full                                                              +---------+---------------+---------+-----------+----------+--------------+  POP       Full            Yes       Yes                                    +---------+---------------+---------+-----------+----------+--------------+  PTV                                                        Not visualized  +---------+---------------+---------+-----------+----------+--------------+  PERO                                                       Not visualized  +---------+---------------+---------+-----------+----------+--------------+   Left Technical Findings: Not visualized segments include Posterior and peroneal veins.   Summary: Right: There is no evidence of deep vein thrombosis in the lower extremity. Left: There is no evidence of deep vein thrombosis in the lower extremity. However, portions of this examination were limited- see technologist comments above.  *See table(s) above for measurements and observations. Electronically signed by Curt Jews MD on 10/22/2019 at 7:49:26 AM.    Final     Vas [name] Upper Extremity Venous Duplex   Result Date: 10/22/2019 UPPER VENOUS STUDY  Indications: Incidental finding of IJ thrombus while performing carotid duplex. Stroke. Comparison Study: No prior study on file Performing Technologist: Maudry Mayhew RDMS, RVT, RDCS  Examination Guidelines: A complete evaluation includes B-mode imaging, spectral Doppler, color Doppler, and power Doppler as needed of all accessible portions of each vessel. Bilateral testing is considered an integral part of a complete examination. Limited examinations for reoccurring indications may be performed as noted.  Right Findings: +----------+------------+---------+-----------+----------+-----------------+  RIGHT      Compressible Phasicity Spontaneous Properties      Summary       +----------+------------+---------+-----------+----------+-----------------+  IJV            None        No         No                       Acute         +----------+------------+---------+-----------+----------+-----------------+  Subclavian     Full        Yes        Yes                                   +----------+------------+---------+-----------+----------+-----------------+  Axillary       Full        Yes        Yes                                   +----------+------------+---------+-----------+----------+-----------------+  Brachial     Partial       No         No                 Age Indeterminate  +----------+------------+---------+-----------+----------+-----------------+  Radial         Full                                                         +----------+------------+---------+-----------+----------+-----------------+  Ulnar          Full                                                         +----------+------------+---------+-----------+----------+-----------------+  Cephalic       Full                                                         +----------+------------+---------+-----------+----------+-----------------+  Basilic                                                   Not visualized    +----------+------------+---------+-----------+----------+-----------------+  Left Findings: +----------+------------+---------+-----------+----------+-------+  LEFT       Compressible Phasicity Spontaneous Properties Summary  +----------+------------+---------+-----------+----------+-------+  Subclavian                 Yes        Yes                         +----------+------------+---------+-----------+----------+-------+  Summary:  Right: No evidence of superficial vein thrombosis in the upper extremity. Findings consistent with acute deep vein thrombosis involving the right internal jugular vein. Findings consistent with age indeterminate deep vein thrombosis involving the one of the paired veins of the right brachial veins.  Left: No evidence of thrombosis in the subclavian.  *See table(s) above for measurements and observations.  Diagnosing physician:  Curt Jews MD Electronically signed by Curt Jews MD on 10/22/2019 at 7:49:40 AM.    Final         Assessment/Plan: Diagnosis: Impaired gait and mobility secondary to acute right basal ganglia infarct 1. Does the need for close, 24 hr/day medical supervision in concert with the patient's rehab needs make it unreasonable for this patient to be served in a less intensive setting? Yes 2. Co-Morbidities requiring supervision/potential complications: ESRD, HTN, HLD, DM2 3. Due to safety, skin/wound care, disease management, medication administration, pain management and patient education, does the patient require 24 hr/day rehab nursing? Yes 4. Does the patient require coordinated care of a physician, rehab nurse, therapy disciplines of PT, OT, SLP to address physical and functional deficits in the context of the above medical diagnosis(es)? Yes Addressing deficits in the following areas: balance, endurance, locomotion, strength, transferring, bowel/bladder control, bathing, dressing, feeding, grooming, toileting and psychosocial support 5. Can the patient actively participate in an intensive therapy program of at least 3 hrs of therapy per day at least 5 days per week? Yes 6. The potential for patient to make measurable gains while on inpatient rehab is good 7.  Anticipated functional outcomes upon discharge from inpatient rehab are modified independent  with PT, modified independent with OT, independent with SLP. 8. Estimated rehab length of stay to reach the above functional goals is: 10-14 days 9. Anticipated discharge destination: Home 10. Overall Rehab/Functional Prognosis: excellent   RECOMMENDATIONS: This patient's condition is appropriate for continued rehabilitative care in the following setting: CIR Patient has agreed to participate in recommended program. Yes Note that insurance prior authorization may be required for reimbursement for recommended care.   Comment: Mrs. Cornelio would be  an excellent rehabilitation candidate and would be able to tolerate 3H of daily therapy.   Lavon Paganini Angiulli, PA-C 10/23/2019    I have personally performed a face to face diagnostic evaluation, including, but not limited to relevant history and physical exam findings, of this patient and developed relevant assessment and plan.  Additionally, I have reviewed and concur with the physician assistant's documentation above.   Leeroy Cha, MD          Revision History Date/Time User Provider Type Action  10/23/2019  1:55 PM Ranell Patrick Clide Deutscher, MD Physician Sign  10/23/2019  5:27 AM Angiulli, Lavon Paganini, PA-C Physician Assistant Pend   View Details Report     Routing History

## 2019-11-01 NOTE — Progress Notes (Signed)
Patient arrived to room 4W12 via bed from 3W32. Patient is alert and oriented times 4. Bed is in the lowest and locked position with bed rails up times 3. Belongings and call bell within reach. VSS. NAD.

## 2019-11-01 NOTE — Progress Notes (Signed)
Occupational Therapy Treatment Patient Details Name: [name] MRN: [id number] DOB: Sep 26, 1951 Today's Date: 11/01/2019    History of present illness Ms. Everlee Keiter is a 68 y.o. female with history of ESRD ( on dialysis M-W-F), TIA 2010, HTN, HLD, Ha, DM2 who presented to  [facility] with c/o dizziness, left arm weakness and left leg numbness. MRI revealed acute R basal ganglia infarct. In addition, she has R IJ DVT and spiked a fever 11/27 with concern for sepsis.    OT comments  Pt needing increased encouragement for session secondary to back pain. Medications given prior to OT intervention. Pt requiring +2 assistance with mobility this session secondary to increased pain and weakness. Pt ambulating to sink and having to sit immediately with chair pushed under her. Pt engaged in grooming tasks while seated and then performing squat pivot with max A onto [facility] with second person assisting with steady equipment and clothing management/hygiene. Pt continues to benefit from OT intervention. CIR recommendation remains appropriate to address functional deficits.   Follow Up Recommendations  CIR;Supervision/Assistance - 24 hour    Equipment Recommendations  Other (comment)(defer to next venue of care)       Precautions / Restrictions Precautions Precautions: Fall Required Braces or Orthoses: Spinal Brace Spinal Brace: Thoracolumbosacral orthotic;Other (comment) Spinal Brace Comments: for comfort with mobility. Restrictions Weight Bearing Restrictions: No       Mobility Bed Mobility Overal bed mobility: Needs Assistance Bed Mobility: Rolling Rolling: Mod assist   Supine to sit: Mod assist Sit to supine: Max assist   General bed mobility comments: Pt needing increased assistance this session secondary to pain with movement  Transfers Overall transfer level: Needs assistance Equipment used: Rolling walker (2 wheeled) Transfers: Sit to/from Stand Sit to Stand:  Mod assist;+2 physical assistance         General transfer comment: lifting assistance to stand from EOB and cuing for hand placement.    Balance Overall balance assessment: Needs assistance Sitting-balance support: Feet supported;Bilateral upper extremity supported Sitting balance-Leahy Scale: Poor Sitting balance - Comments: heavy UE support on EOB   Standing balance support: Bilateral upper extremity supported;During functional activity Standing balance-Leahy Scale: Poor Standing balance comment: Heavy reliance on RW in standing and physical assistance needed            ADL either performed or assessed with clinical judgement   ADL Overall ADL's : Needs assistance/impaired     Grooming: Sitting;Set up Grooming Details (indicate cue type and reason): Pt unable to remain standing and sitting with set up A to obtain needed items for self care                     Cognition Arousal/Alertness: Awake/alert Behavior During Therapy: [facility] for tasks assessed/performed Overall Cognitive Status: Within Functional Limits for tasks assessed              General Comments: Pt reports feeling painful, pain meds on board but very resistive to movement initially but as tx progressed she quickly wanted to return back to bed.                   Pertinent Vitals/ Pain       Pain Assessment: Faces Pain Score: 5  Faces Pain Scale: Hurts whole lot Pain Location: lower back Pain Descriptors / Indicators: Grimacing;Moaning;Aching Pain Intervention(s): Monitored during session;Repositioned         Frequency  Min 2X/week        Progress  Toward Goals  OT Goals(current goals can now be found in the care plan section)  Progress towards OT goals: Progressing toward goals  Acute Rehab OT Goals Patient Stated Goal: to go to rehab OT Goal Formulation: With patient Time For Goal Achievement: 11/15/19 Potential to Achieve Goals: Good  Plan Discharge plan remains  appropriate    Co-evaluation    PT/OT/SLP Co-Evaluation/Treatment: Yes Reason for Co-Treatment: Complexity of the patient's impairments (multi-system involvement);For patient/therapist safety PT goals addressed during session: Mobility/safety with mobility OT goals addressed during session: ADL's and self-care      AM-PAC OT "6 Clicks" Daily Activity     Outcome Measure   Help from another person eating meals?: A Little Help from another person taking care of personal grooming?: A Lot Help from another person toileting, which includes using toliet, bedpan, or urinal?: Total Help from another person bathing (including washing, rinsing, drying)?: A Lot Help from another person to put on and taking off regular upper body clothing?: A Lot Help from another person to put on and taking off regular lower body clothing?: Total 6 Click Score: 11    End of Session Equipment Utilized During Treatment: Gait belt  OT Visit Diagnosis: Unsteadiness on feet (R26.81);Other abnormalities of gait and mobility (R26.89);Muscle weakness (generalized) (M62.81);Low vision, both eyes (H54.2);Other symptoms and signs involving cognitive function;Pain Pain - part of body: (back)   Activity Tolerance Patient limited by pain   Patient Left in bed;with call bell/phone within reach;with bed alarm set;with family/visitor present   Nurse Communication Mobility status;Patient requests pain meds        Time: 1457-1520 OT Time Calculation (min): 23 min  Charges: OT General Charges $OT Visit: 1 Visit OT Treatments $Self Care/Home Management : 8-22 mins  Gypsy Decant MS, OTR/L 11/01/2019, 4:28 PM

## 2019-11-01 NOTE — Progress Notes (Signed)
Diana Ribas, MD  Physician  Physical Medicine and Rehabilitation  PMR Pre-admission  Signed  Date of Service:  10/31/2019 10:00 AM      Related encounter: ED to Hosp-Admission (Discharged) from 10/20/2019 in [facility]      Signed         PMR Admission Coordinator Pre-Admission Assessment  Patient: [name] is an 68 y.o., female MRN: [id number] DOB: [birth year] Height: '5\' 7"'  (170.2 cm) Weight: 62.6 kg                                                                                                                                      Insurance Information HMO:     PPO: yes     PCP:      IPA:      80/20:      OTHER:  PRIMARY: [employer]      Policy#: [id number]      Subscriber: Patient CM Name: Mortimer Fries      Phone#: [phone]     Fax#: [phone] Pre-Cert#: [id number]      Employer:  Josem Kaufmann [phone] by Mortimer Fries for admit to CIR. Pt is approved for admit date of 12/9 with follow up clinicals due 12/16 and every week til DC. Follow up CM is Lestine Box (p): [phone] (f): [phone] Benefits:  Phone #: online     Name: [email or web address], transaction [id number] Eff. Date: 04/23/2018- still active     Deduct: does not have one      Out of Pocket Max: $6,700 ($6,700 met)      Life Max: NA CIR: $345/day co-pay for days 1-4, $0/day co-pay for days 5-90      SNF: $0/day copay for days 1-20, $178/day co-pay for days 21-100; limited to 100 days/cal yr Outpatient: limited by medical necessity     Co-Pay: $10-$40/visit co-pay, pending service provider Home Health: 100% coverage; limited by medical necessity      Co-Pay: 0% co-insurance DME: 80% coverage     Co-Pay: 20% co-insurance Providers:  SECONDARY: None      Policy#:       Subscriber:  CM Name:       Phone#:      Fax#:  Pre-Cert#:       Employer:  Benefits:  Phone #:      Name:  Eff. Date:      Deduct:       Out of Pocket Max:       Life Max: CIR:       SNF:   Outpatient:      Co-Pay:  Home Health:      Co-Pay:  DME:      Co-Pay:   Medicaid Application Date:       Case Manager:  Disability Application Date:       Case Worker:   The "Data Collection Information Summary" for patients  in Inpatient Rehabilitation Facilities with attached "Privacy Act [place] Records" was provided and verbally reviewed with: Family  Emergency Contact Information         Contact Information    Name Relation Home Work [place] Sister [phone]     Lars Pinks Sister   [phone]   [place], [name] Son   [phone]     Current Medical History  Patient Admitting Diagnosis: Acute ischemic stroke + recurrent pseudomonas bacteremia with large mitral valve vegetation.   History of Present Illness:[name] is a 68 year old female with history of end-stage renal disease with hemodialysis Monday Wednesday Fridays, diet controlled diabetes mellitus, pseudomonal bacteremia and early discitis August 2020 completing a 6-week course of Tressie Ellis as well as history of infected AV graft, TIA 2010 maintained on aspirin and Plavix, hypertension, hyperlipidemia. Per chart review she lives with her brother. Independent prior to admission. Presented 10/20/2019 with left-sided weakness and difficulty with ambulation. Her last dialysis was 10/18/2019. CT of the brain showed acute right basal ganglia infarction. Patient did not receive TPA. Admission chemistries with creatinine 7.76, BUN 56, WBC 15,700, hemoglobin 9.7, SARS coronavirus negative. MRI/MRA showed acute right basal ganglia infarction chronic ischemia with multiple old infarctions. Negative head MRA. Carotid Dopplers unremarkable. Echocardiogram with ejection fraction of 65% without emboli or PFO. Hospital course complicated by low-grade fever of 101 with findings of right IJ DVT on Dopplers. Patient was placed on intravenous heparin and placed on Coumadin that she  would remain on for approximately 3 months. Infectious disease work-up during fever revealed Pseudomonas by BCID. TEE performed on 10/26/2019 demonstrates large vegetation of mitral valve. CTS consulted Dr. Darcey Nora felt the best course of action was continued IV antibiotics no current plan for mitral valve repair or replacement due to risk of recurrent infection at this time and current recommendations are to continue antibiotics 6 weeks with cefepime as well as Cipro with end date to be established. Latest follow-up blood culture showed no growth. Hemodialysis remains ongoing as per renal services. Right IJ hemodialysis catheter was removed 10/23/2019 with placement of new tunneled catheter 10/26/2019 for continued hemodialysis per renal services. Noted on 10/27/2019 patient with altered mental status follow-up cranial CT scan completed that showed evidence of a new small frontal lobe white matter infarction new since MRI of 10/20/2019 near the left frontal horn. No associated hemorrhage or mass-effect with follow-up neurology services recommending continued plan of care with ongoing Coumadin. Patient did have some nonspecific back pain MRI of the left hip as well as lumbar spine no findings suspicious for osteomyelitis or septic arthritis. There was some edema of the paraspinal and psoas muscles as well as osteodystrophy. She was fitted with a TLSO back brace only for comfort. Therapy evaluations have been completed and patient participating with therapies. Patient is to admit to CIR on 11/01/2019.    Complete NIHSS TOTAL: 4 Glasgow Coma Scale Score: 15  Past Medical History      Past Medical History:  Diagnosis Date  . Arthritis    hands  . Constipation   . Diabetes mellitus without complication (HCC)    Type II  . ESRD (end stage renal disease) ([place])     M/W/F- Hemodialysis  . GERD (gastroesophageal reflux disease)   . Headache   . Hyperlipidemia   . Hypertension   .  Irregular heart rate   . Stroke [facility])    TIA - approx 2010- no residual    Family History  family history includes  Alcoholism in her father; Cirrhosis in her father; Diabetes in her mother; Heart failure in her mother; Heart murmur in her mother.  Prior Rehab/Hospitalizations:  Has the patient had prior rehab or hospitalizations prior to admission? Yes  Has the patient had major surgery during 100 days prior to admission? Yes  Current Medications   Current Facility-Administered Medications:  .  0.9 %  sodium chloride infusion (Manually program via Guardrails IV Fluids), , Intravenous, Once, Blount, Xenia T, NP .  0.9 %  sodium chloride infusion, , Intravenous, Continuous, Crenshaw, Denice Bors, MD, Last Rate: 10 mL/hr at 10/31/19 2059 .  acetaminophen (TYLENOL) tablet 650 mg, 650 mg, Oral, Q4H PRN, 650 mg at 10/31/19 1022 **OR** acetaminophen (TYLENOL) 160 MG/5ML solution 650 mg, 650 mg, Per Tube, Q4H PRN **OR** acetaminophen (TYLENOL) suppository 650 mg, 650 mg, Rectal, Q4H PRN, Lelon Perla, MD .  acetaminophen (TYLENOL) tablet 1,000 mg, 1,000 mg, Oral, Once, Lelon Perla, MD .  calcium carbonate (TUMS - dosed in mg elemental calcium) chewable tablet 200 mg of elemental calcium, 1 tablet, Oral, 2 times per day, Lelon Perla, MD, 200 mg of elemental calcium at 10/31/19 1836 .  carvedilol (COREG) tablet 6.25 mg, 6.25 mg, Oral, BID, Lelon Perla, MD, 6.25 mg at 10/31/19 2225 .  ceFEPIme (MAXIPIME) 1 g in sodium chloride 0.9 % 100 mL IVPB, 1 g, Intravenous, Q24H, Crenshaw, Denice Bors, MD, Stopped at 10/31/19 2139 .  Chlorhexidine Gluconate Cloth 2 % PADS 6 each, 6 each, Topical, Q0600, Lelon Perla, MD, 6 each at 11/01/19 0533 .  ciprofloxacin (CIPRO) tablet 500 mg, 500 mg, Oral, q1800, [place] Callas, NP, 500 mg at 10/31/19 1836 .  Darbepoetin Alfa (ARANESP) injection 100 mcg, 100 mcg, Intravenous, Q Wed-HD, Elmarie Shiley, MD .  doxercalciferol (HECTOROL)  injection 1.5 mcg, 1.5 mcg, Intravenous, Q M,W,F-HD, Lelon Perla, MD, 1.5 mcg at 10/30/19 1837 .  feeding supplement (ENSURE ENLIVE) (ENSURE ENLIVE) liquid 237 mL, 237 mL, Oral, BID BM, Swayze, Ava, DO, 237 mL at 10/30/19 0857 .  fluticasone (FLONASE) 50 MCG/ACT nasal spray 1 spray, 1 spray, Each Nare, Daily PRN, Lelon Perla, MD .  gabapentin (NEURONTIN) capsule 100 mg, 100 mg, Oral, QHS, Crenshaw, Denice Bors, MD, 100 mg at 10/31/19 2225 .  heparin 1000 UNIT/ML injection, , , ,  .  heparin injection 2,500 Units, 40 Units/kg, Dialysis, PRN, Elmarie Shiley, MD .  heparin injection 2,600 Units, 40 Units/kg, Dialysis, PRN, Elmarie Shiley, MD .  hydrALAZINE (APRESOLINE) injection 10 mg, 10 mg, Intravenous, Q6H PRN, Lelon Perla, MD .  insulin aspart (novoLOG) injection 0-5 Units, 0-5 Units, Subcutaneous, QHS, Lelon Perla, MD, 2 Units at 10/30/19 2150 .  insulin aspart (novoLOG) injection 0-9 Units, 0-9 Units, Subcutaneous, TID WC, Lelon Perla, MD, 2 Units at 10/31/19 1836 .  isosorbide mononitrate (IMDUR) 24 hr tablet 30 mg, 30 mg, Oral, QPC lunch, Lelon Perla, MD, 30 mg at 10/31/19 1208 .  multivitamin (RENA-VIT) tablet 1 tablet, 1 tablet, Oral, QPC lunch, Lelon Perla, MD, 1 tablet at 10/31/19 2224 .  ondansetron (ZOFRAN) injection 4 mg, 4 mg, Intravenous, Q6H PRN, Lelon Perla, MD, 4 mg at 10/31/19 0220 .  oxyCODONE (Oxy IR/ROXICODONE) immediate release tablet 5 mg, 5 mg, Oral, Q6H PRN, Lelon Perla, MD, 5 mg at 10/31/19 2225 .  senna-docusate (Senokot-S) tablet 1 tablet, 1 tablet, Oral, QHS PRN, Lelon Perla, MD .  warfarin (COUMADIN) tablet 2.5  mg, 2.5 mg, Oral, q1800, Swayze, Ava, DO .  Warfarin - Pharmacist Dosing Inpatient, , Does not apply, q1800, Swayze, Ava, DO  Facility-Administered Medications Ordered in Other Encounters:  .  0.9 %  sodium chloride infusion, , Intravenous, Continuous, Monia Sabal, PA-C  Patients Current Diet:     Diet  Order                  Diet regular Room service appropriate? Yes; Fluid consistency: Thin  Diet effective now               Precautions / Restrictions Precautions Precautions: Fall Spinal Brace: Thoracolumbosacral orthotic, Other (comment) Spinal Brace Comments: for comfort with mobility. Restrictions Weight Bearing Restrictions: No   Has the patient had 2 or more falls or a fall with injury in the past year?No  Prior Activity Level Community (5-7x/wk): retired (from [employer]), did drive PTA, Independent PTA.   Prior Functional Level Prior Function Level of Independence: Independent Comments: household and short distanced community ambulator, drives  Self Care: Did the patient need help bathing, dressing, using the toilet or eating?  Independent  Indoor Mobility: Did the patient need assistance with walking from room to room (with or without device)? Independent  Stairs: Did the patient need assistance with internal or external stairs (with or without device)? Independent  Functional Cognition: Did the patient need help planning regular tasks such as shopping or remembering to take medications? Needed some help  Home Assistive Devices / [place] Devices/Equipment: CBG Meter Home Equipment: Kasandra Knudsen - single point  Prior Device Use: Indicate devices/aids used by the patient prior to current illness, exacerbation or injury? Walker  Current Functional Level Cognition  Overall Cognitive Status: Within Functional Limits for tasks assessed Orientation Level: Oriented X4 General Comments: Pt is a little slow to process but able to answer questions and follow commands during session.  Cognition not formally assessed. Attention: Sustained Sustained Attention: Impaired Sustained Attention Impairment: Verbal basic, Functional basic Memory: Impaired Memory Impairment: Decreased short term memory Decreased Short Term Memory: Verbal basic,  Functional basic Problem Solving: Impaired Problem Solving Impairment: Functional basic Safety/Judgment: Impaired    Extremity Assessment (includes Sensation/Coordination)  Upper Extremity Assessment: Generalized weakness, RUE deficits/detail, LUE deficits/detail RUE Deficits / Details: grossly 3/5 LUE Deficits / Details: LUE weaker than RUE;grossly 2/5 LUE Coordination: decreased fine motor, decreased gross motor  Lower Extremity Assessment: Defer to PT evaluation LLE Deficits / Details: hip flex 2/5, knee ext 2-/5, L knee buckling with attempted stand LLE Sensation: (unable to test today) LLE Coordination: decreased gross motor    ADLs  Overall ADL's : Needs assistance/impaired Eating/Feeding: Set up, Bed level Eating/Feeding Details (indicate cue type and reason): eating sherbert upon arrival Grooming: Minimal assistance, Sitting Grooming Details (indicate cue type and reason): bed level Upper Body Bathing: Maximal assistance Lower Body Bathing: Total assistance Upper Body Dressing : Maximal assistance Lower Body Dressing: Total assistance Toilet Transfer: Maximal assistance, +2 for physical assistance Toilet Transfer Details (indicate cue type and reason): pt unable to tolerate standing this session;poor balance sitting EOB Toileting- Clothing Manipulation and Hygiene: Total assistance General ADL Comments: deferred OOB mobility secondary to pt decreased balance sitting EOB and reports of increased lightheadedness;pt required maxA for sitting balance EOB    Mobility  Overal bed mobility: Needs Assistance Bed Mobility: Rolling, Sidelying to Sit Rolling: Min assist Sidelying to sit: Min assist Supine to sit: Mod assist, HOB elevated Sit to supine: Mod assist Sit to sidelying: Mod  assist General bed mobility comments: Pt required assistance for rolling and trunk elevation, heavy use of railing but better ability to move into sitting.  Pt with heavy reliance on BUE support  in sitting and total assistance to donn brace in sitting.    Transfers  Overall transfer level: Needs assistance Equipment used: Rolling walker (2 wheeled) Transfers: Sit to/from Stand Sit to Stand: Mod assist  Lateral/Scoot Transfers: Max assist, +2 physical assistance General transfer comment: Cues for hand placement to and from seated surface.  Bed placed in elevated height for improved ease, poor eccentric load returning to chair due to weakness.    Ambulation / Gait / Stairs / Wheelchair Mobility  Ambulation/Gait Ambulation/Gait assistance: Mod assist, +2 safety/equipment Gait Distance (Feet): 20 Feet Assistive device: Rolling walker (2 wheeled) Gait Pattern/deviations: Shuffle, Trunk flexed, Narrow base of support, Decreased stride length, Step-through pattern General Gait Details: Cues for upper trunk control and RW position.  Cues for B stride increase.  Close chair follow due to slow buckling last session.  Good progression of gt distance. Gait velocity: decreased    Posture / Balance Dynamic Sitting Balance Sitting balance - Comments: Noted posterior lean as well.  Cues to weight shift forward to place weight in B feet.  Heavy B UE support in sitting. Balance Overall balance assessment: Needs assistance Sitting-balance support: Feet supported, Bilateral upper extremity supported Sitting balance-Leahy Scale: Poor Sitting balance - Comments: Noted posterior lean as well.  Cues to weight shift forward to place weight in B feet.  Heavy B UE support in sitting. Postural control: Left lateral lean Standing balance support: Bilateral upper extremity supported, During functional activity Standing balance-Leahy Scale: Poor Standing balance comment: Heavy reliance on RW in standing.    Special needs/care consideration BiPAP/CPAP: no CPM: no Continuous Drip IV: 1.9% sodium chloride infusion  Dialysis: yes        Days: MWF Life Vest: no Oxygen: no Special Bed: no Trach Size:  no Wound Vac (area): no      Location: no Skin: catheter location to right chest                Bowel mgmt: last BM: 10/27/2019, continent Bladder mgmt: continent, does still make urine Diabetic mgmt: yes Behavioral consideration : no Chemo/radiation : no     Previous Home Environment (from acute therapy documentation) Living Arrangements: Other relatives Available Help at Discharge: Family Type of Home: House Home Layout: One level Home Access: Stairs to enter Entrance Stairs-Rails: Right, Left, Can reach both Entrance Stairs-Number of Steps: 3 Home Care Services: No Additional Comments: pt states that she lives with her brother, chart states that she lives with her husband?  Discharge Living Setting Plans for Discharge Living Setting: House, Other (Comment)(Pt's son, [name], will move in (he can work from home). ) Type of Home at Discharge: House Discharge Home Layout: One level Discharge Home Access: Stairs to enter Entrance Stairs-Rails: Can reach both Entrance Stairs-Number of Steps: 3 Discharge Bathroom Shower/Tub: Tub/shower unit, Walk-in shower Discharge Bathroom Toilet: Standard Discharge Bathroom Accessibility: Yes How Accessible: Accessible via walker Does the patient have any problems obtaining your medications?: No  Social/Family/Support Systems Patient Roles: Other (Comment)(has family close by; sister helps organize meds.) Contact Information: son: [name]: [phone]; Anderson [place] (sister): [phone] Anticipated Caregiver: son + sister Anticipated Caregiver's Contact Information: see above Ability/Limitations of Caregiver: Min/Mod A Caregiver Availability: 24/7 Discharge Plan Discussed with Primary Caregiver: Yes(pt and son, [name]) Is Caregiver In Agreement with Plan?: Yes Does Caregiver/Family  have Issues with Lodging/Transportation while Pt is in Rehab?: No   Goals/Additional Needs Patient/Family Goal for Rehab: PT/OT: Min A, SLP: NA Expected length  of stay: 12-16 days Cultural Considerations: NA Dietary Needs: regular diet, thin liquids Equipment Needs: TBD Special Service Needs: HD M/W/F Pt/Family Agrees to Admission and willing to participate: Yes Program Orientation Provided & Reviewed with Pt/Caregiver Including Roles  & Responsibilities: Yes(pt and her son, [name])  Barriers to Discharge: Medical stability, Home environment access/layout, IV antibiotics, Hemodialysis  Barriers to Discharge Comments: has great physical assist at DC. Will need to negotiate stairs, plan is for antibiotics til Jan 13th, this is a relaspsing infection.    Decrease burden of Care through IP rehab admission: NA   Possible need for SNF placement upon discharge: Not anticipated. Pt has great social support from her son and sister who plan to physically assist at DC. Pt is highly motivated and based on her Independent PLOF and current progression with therapies, anticipate pt will make measurable gains in a reasonable time and return home safely.    Patient Condition: This patient's medical and functional status has changed since the consult dated: 10/23/2019 in which the Rehabilitation Physician determined and documented that the patient's condition is appropriate for intensive rehabilitative care in an inpatient rehabilitation facility. See "History of Present Illness" (above) for medical update. Functional changes are: improvement in functional transfers from Max A +2 for lateral scoot tranfers to Mod A transfers and initiation of gait of up to Mod A +2 20 feet with RW. Patient's medical and functional status update has been discussed with the Rehabilitation physician and patient remains appropriate for inpatient rehabilitation. Will admit to inpatient rehab today.  Preadmission Screen Completed By:  Raechel Ache, OT, 11/01/2019 10:12 AM ______________________________________________________________________   Discussed status with Dr. Ranell Patrick on  11/01/2019 at 10:12AM and received approval for admission today.  Admission Coordinator:  Raechel Ache, time 10:12AM/Date 11/01/2019.           Revision History Date/Time User Provider Type Action  11/01/2019 10:15 AM Ranell Patrick, Clide Deutscher, MD Physician Sign  11/01/2019 10:13 AM Raechel Ache, OT Rehab Admission Coordinator Share  View Details Report

## 2019-11-01 NOTE — Progress Notes (Signed)
PHARMACY CONSULT NOTE FOR:  OUTPATIENT  PARENTERAL ANTIBIOTIC THERAPY (OPAT)  Indication: Pseudomonas endocarditis Regimen: Cefepime 1 gm Q 24 hours + Cipro 500 mg daily  End date: 12/06/19  Patient is going to CIR inpatient and will be monitored by pharmacy during her admission.   On discharge, her cefepime will be switched to dosing with dialysis. ID pharmacist will follow  IV antibiotic discharge orders are pended. To discharging provider:  please sign these orders via discharge navigator,  Select New Orders & click on the button choice - Manage This Unsigned Work.     Thank you for allowing pharmacy to be a part of this patient's care.  Jimmy Footman, PharmD, BCPS, [place] Clinical Pharmacist Phone: [phone] 11/01/2019, 11:42 AM

## 2019-11-01 NOTE — H&P (Signed)
Physical Medicine and Rehabilitation Admission H&P    No chief complaint on file. : HPI: [name] is a 68 year old right-handed female with history of end-stage renal disease with hemodialysis Monday Wednesday Fridays, diet controlled diabetes mellitus, pseudomonal bacteremia and early discitis August 2020 completing a 6-week course of Tressie Ellis as well as history of infected AV graft, TIA 2010 maintained on aspirin and Plavix, hypertension, hyperlipidemia.  Per chart review she lives with her brother.  Independent prior to admission.  Presented 10/20/2019 with left-sided weakness and difficulty with ambulation.  Her last dialysis was 10/18/2019.  CT of the brain showed acute right basal ganglia infarction.  Patient did not receive TPA.  Admission chemistries with creatinine 7.76, BUN 56, WBC 15,700, hemoglobin 9.7, SARS coronavirus negative.  MRI/MRA showed acute right basal ganglia infarction chronic ischemia with multiple old infarctions.  Negative head MRA.  Carotid Dopplers unremarkable.  Echocardiogram with ejection fraction of 65% without emboli or PFO.  Hospital course complicated by low-grade fever of 101 with findings of right IJ DVT on Dopplers.  Patient was placed on intravenous heparin and placed on Coumadin that she would remain on for approximately 3 months.  Infectious disease work-up during fever revealed Pseudomonas by BCID.  TEE performed on 10/26/2019 demonstrates large vegetation of mitral valve.  CTS consulted Dr. Darcey Nora felt the best course of action was continued IV antibiotics no current plan for mitral valve repair or replacement due to risk of recurrent infection at this time and current recommendations are to continue antibiotics 6 weeks with cefepime as well as Cipro with end date to be established.  Latest follow-up blood culture showed no growth.  Hemodialysis remains ongoing as per renal services.  Right IJ hemodialysis catheter was removed 10/23/2019 with placement  of new tunneled catheter 10/26/2019 for continued hemodialysis per renal services.  Noted on 10/27/2019 patient with altered mental status follow-up cranial CT scan completed that showed evidence of a new small frontal lobe white matter infarction new since MRI of 10/20/2019 near the left frontal horn.  No associated hemorrhage or mass-effect with follow-up neurology services recommending continued plan of care with ongoing Coumadin.  Patient did have some nonspecific back pain MRI of the left hip as well as lumbar spine no findings suspicious for osteomyelitis or septic arthritis.  There was some edema of the paraspinal and psoas muscles as well as osteodystrophy.  She was fitted with a TLSO back brace only for comfort.  Therapy evaluations have been completed and patient participating with therapies.  Patient was admitted for a comprehensive rehab program.  Review of Systems  Constitutional: Positive for fever.  HENT: Negative for hearing loss.   Eyes: Negative for blurred vision and double vision.  Respiratory: Positive for shortness of breath.   Cardiovascular: Positive for palpitations and leg swelling. Negative for chest pain.  Gastrointestinal: Positive for constipation. Negative for heartburn, nausea and vomiting.       GERD  Genitourinary: Negative for flank pain and hematuria.  Musculoskeletal: Positive for back pain.  Skin: Negative for rash.  Neurological: Positive for weakness.  All other systems reviewed and are negative.  Past Medical History:  Diagnosis Date   Arthritis    hands   Constipation    Diabetes mellitus without complication ([place])    Type II   ESRD (end stage renal disease) ([place])     M/W/F- Hemodialysis   GERD (gastroesophageal reflux disease)    Headache    Hyperlipidemia    Hypertension  Irregular heart rate    Stroke ([facility])    TIA - approx 2010- no residual   Past Surgical History:  Procedure Laterality Date   ABDOMINAL HYSTERECTOMY      AORTIC ARCH ANGIOGRAPHY N/A 03/28/2019   Procedure: AORTIC ARCH ANGIOGRAPHY;  Surgeon: Waynetta Sandy, MD;  Location: [facility];  Service: Cardiovascular;  Laterality: N/A;   AV FISTULA PLACEMENT Left 06/13/2018   Procedure: ARTERIOVENOUS (AV) FISTULA CREATION;  Surgeon: Rosetta Posner, MD;  Location: [facility];  Service: Vascular;  Laterality: Left;   AV FISTULA PLACEMENT Left 08/18/2018   Procedure: INSERTION OF 4-7MM X 45CM ARTERIOVENOUS (AV) GORE-TEX GRAFT LEFT  FOREARM;  Surgeon: Rosetta Posner, MD;  Location: [facility];  Service: Vascular;  Laterality: Left;   AV FISTULA PLACEMENT Right 04/06/2019   Procedure: INSERTION OF ARTERIOVENOUS (AV) GORE-TEX GRAFT RIGHT UPPER ARM;  Surgeon: Waynetta Sandy, MD;  Location: [facility];  Service: Vascular;  Laterality: Right;   AV FISTULA PLACEMENT Right 04/13/2019   Procedure: INSERTION OF ARTERIOVENOUS (AV) BOVINE  ARTEGRAFT GRAFT RIGHT UPPER EXTREMITY;  Surgeon: Serafina Mitchell, MD;  Location: [facility];  Service: Vascular;  Laterality: Right;   [place] REMOVAL Right 05/16/2019   Procedure: REMOVAL OF ARTERIOVENOUS GORETEX GRAFT ([place]) RIGHT ARM;  Surgeon: Angelia Mould, MD;  Location: [facility];  Service: Vascular;  Laterality: Right;   [place] Right 03/07/2019   Procedure: First Stage Bascilic Vein Transposition Right Arm;  Surgeon: Serafina Mitchell, MD;  Location: [facility];  Service: Vascular;  Laterality: Right;   CATARACT EXTRACTION Right 2005   INSERTION OF DIALYSIS CATHETER N/A 08/18/2018   Procedure: INSERTION OF DIALYSIS CATHETER;  Surgeon: Rosetta Posner, MD;  [facility];  Service: Vascular;  Laterality: N/A;   INSERTION OF DIALYSIS CATHETER Right 07/25/2019   Procedure: INSERTION OF DIALYSIS CATHETER RIGHT INTERNAL JUGULAR (TUNNELED);  Surgeon: Virl Cagey, MD;  Location: [facility];  Service: General;  Laterality: Right;   IR FLUORO GUIDE CV LINE LEFT  10/26/2019   IR FLUORO GUIDE CV LINE RIGHT   12/06/2018   IR PTA ADDL CENTRAL DIALYSIS SEG THRU DIALY CIRCUIT RIGHT Right 12/06/2018   IR REMOVAL TUN CV CATH W/O FL  10/23/2019   IR US GUIDE VASC ACCESS LEFT  10/26/2019   TEE WITHOUT CARDIOVERSION N/A 10/26/2019   Procedure: TRANSESOPHAGEAL ECHOCARDIOGRAM (TEE);  Surgeon: Lelon Perla, MD;  Location: [facility];  Service: Cardiovascular;  Laterality: N/A;   THROMBECTOMY AND REVISION OF ARTERIOVENTOUS (AV) GORETEX  GRAFT Left 09/29/2018   Procedure: INSERTION OF ARTERIOVENTOUS (AV) GORETEX  GRAFT ARM;  Surgeon: Waynetta Sandy, MD;  Location: [facility];  Service: Vascular;  Laterality: Left;   UPPER EXTREMITY ANGIOGRAPHY Right 03/28/2019   Procedure: UPPER EXTREMITY ANGIOGRAPHY;  Surgeon: Waynetta Sandy, MD;  Location: [facility];  Service: Cardiovascular;  Laterality: Right;   VENOGRAM  10/27/2018   Procedure: Venogram;  Surgeon: Marty Heck, MD;  Location: [facility];  Service: Cardiovascular;;  bilateral arm   Family History  Problem Relation Age of Onset   Heart murmur Mother    Heart failure Mother    Diabetes Mother    Cirrhosis Father    Alcoholism Father    Social History:  reports that she has never smoked. She has never used smokeless tobacco. She reports that she does not drink alcohol or use drugs. Allergies: No Known Allergies Medications Prior to Admission  Medication  Sig Dispense Refill   Accu-Chek Softclix Lancets lancets      Alcohol Swabs (B-D SINGLE USE SWABS REGULAR) PADS      aspirin EC 81 MG tablet Take 81 mg by mouth daily after lunch.      calcium carbonate (TUMS - DOSED IN MG ELEMENTAL CALCIUM) 500 MG chewable tablet Chew 1 tablet by mouth 2 (two) times daily. With lunch & supper     carvedilol (COREG) 6.25 MG tablet Take 6.25 mg by mouth 2 (two) times daily.     ceFEPime (MAXIPIME) IVPB Inject 1 g into the vein daily. Cefepime should always be given in the afternoon and ALWAYS after  dialysis. Indication:  Mitral valve endocarditis Last Day of Therapy:  12/06/2019 Labs - Once weekly:  CBC/D and BMP, Labs - Every other week:  ESR and CRP 1 Units 0   ciprofloxacin (CIPRO) 500 MG tablet Take 1 tablet (500 mg total) by mouth daily at 6 PM. 30 tablet 1   doxercalciferol (HECTOROL) 4 MCG/2ML injection Inject 0.75 mLs (1.5 mcg total) into the vein every Monday, Wednesday, and Friday with hemodialysis. 2 mL 0   feeding supplement, ENSURE ENLIVE, (ENSURE ENLIVE) LIQD Take 237 mLs by mouth 2 (two) times daily between meals. 237 mL 12   fluticasone (FLONASE) 50 MCG/ACT nasal spray Place 1 spray into both nostrils daily as needed for allergies or rhinitis.      gabapentin (NEURONTIN) 100 MG capsule Take 1 capsule (100 mg total) by mouth at bedtime. 30 capsule 2   insulin NPH-regular Human (70-30) 100 UNIT/ML injection Inject 4 Units into the skin 2 (two) times daily with a meal. 10 mL 11   isosorbide mononitrate (IMDUR) 30 MG 24 hr tablet Take 30 mg by mouth daily after lunch.      multivitamin (RENA-VIT) TABS tablet Take 1 tablet by mouth daily after lunch.      oxyCODONE (OXY IR/ROXICODONE) 5 MG immediate release tablet Take 1 tablet (5 mg total) by mouth every 6 (six) hours as needed for moderate pain. 20 tablet 0   pantoprazole (PROTONIX) 40 MG tablet Take 1 tablet by mouth daily as needed.      Polyethylene Glycol 3350 (PEG 3350) 17 GM/SCOOP POWD Take 17 g by mouth daily as needed.      senna-docusate (SENOKOT-S) 8.6-50 MG tablet Take 1 tablet by mouth at bedtime as needed for mild constipation. 10 tablet 0   warfarin (COUMADIN) 2.5 MG tablet Take 1 tablet (2.5 mg total) by mouth daily at 6 PM. 30 tablet 0    Drug Regimen Review Drug regimen was reviewed and remains appropriate with no significant issues identified.    Home: Home Living Family/patient expects to be discharged to:: Private residence Living Arrangements: Other relatives Available Help at Discharge:  Family Type of Home: House Home Access: Stairs to enter Technical brewer of Steps: 3 Entrance Stairs-Rails: Right, Left, Can reach both Home Layout: One level [place] - single point Additional Comments: pt states that she lives with her brother, chart states that she lives with her husband?   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  Pt with heavy reliance on BUE support in sitting and total assistance to donn brace in sitting. Transfers Overall transfer level: Needs assistance Equipment used: Rolling walker (2 wheeled) Transfers: Sit to/from Stand Sit to Stand: Mod assist  Lateral/Scoot Transfers: Max assist, +2 physical assistance General transfer comment: Cues for hand placement to and from seated surface.  Bed placed in elevated height for improved ease, poor eccentric load returning to chair due to weakness. Ambulation/Gait Ambulation/Gait assistance: Mod assist, +2 safety/equipment Gait Distance (Feet): 20 Feet Assistive device: Rolling walker (2 wheeled) Gait Pattern/deviations: Shuffle, Trunk flexed, Narrow base of support, Decreased stride length, Step-through pattern General Gait Details: Cues for upper trunk control and RW position.  Cues for B stride increase.  Close chair follow due to slow buckling last session.  Good progression of gt distance. Gait velocity: decreased  ADL: ADL Overall ADL's : Needs assistance/impaired Eating/Feeding:  Set up, Bed level Eating/Feeding Details (indicate cue type and reason): eating sherbert upon arrival Grooming: Minimal assistance, Sitting Grooming Details (indicate cue type and reason): bed level Upper Body Bathing: Maximal assistance Lower Body Bathing: Total assistance Upper Body Dressing : Maximal assistance Lower Body Dressing: Total assistance Toilet Transfer: Maximal assistance, +2 for physical assistance Toilet Transfer Details (indicate cue type and reason): pt unable to tolerate standing this session;poor balance sitting EOB Toileting- Clothing Manipulation and Hygiene: Total assistance General ADL Comments: deferred OOB mobility secondary to pt decreased balance sitting EOB and reports of increased lightheadedness;pt required maxA for sitting balance EOB  Cognition: Cognition Overall Cognitive Status: Within Functional Limits for tasks assessed Orientation Level: Oriented X4 Attention: Sustained Sustained Attention: Impaired Sustained Attention Impairment: Verbal basic, Functional basic Memory: Impaired Memory Impairment: Decreased short term memory Decreased Short Term Memory: Verbal basic, Functional basic Problem Solving: Impaired Problem Solving Impairment: Functional basic Safety/Judgment: Impaired Cognition Arousal/Alertness: Awake/alert Behavior During Therapy: WFL for tasks assessed/performed Overall Cognitive Status: Within Functional Limits for tasks assessed General Comments: Pt is a little slow to process but able to answer questions and follow commands during session.  Cognition not formally assessed  Physical Exam: Blood pressure (!) 147/72, pulse 94, temperature 99.3 F (37.4 C), resp. rate 18, height '5\' 7"'  (1.702 m), weight 64.2 kg, SpO2 100 %.  Physical Exam   Gen: no distress, normal appearing HEENT: oral mucosa pink and moist, NCAT Cardio: Reg rate Chest: normal effort, normal rate of breathing Abd: soft, non-distended Ext: no edema Skin:  intact Neurological: 4+/5 strength throughout. Sensation intact.  Patient is sitting up in bed.  She is a bit lethargic but easily arousable.  Makes good eye contact with examiner follows full commands.  She provides her name and age.  Fair insight to her deficits.  Musculoskeletal: Psych: pleasant, normal affect  Results for orders placed or performed during the hospital encounter of 10/20/19 (from the past 48 hour(s))  Glucose, capillary     Status: Abnormal   Collection Time: 10/30/19  9:25 PM  Result Value Ref Range   Glucose-Capillary 236 (H) 70 - 99 mg/dL   Comment 1 Notify RN   Renal function panel     Status: Abnormal   Collection Time: 10/31/19  3:58 AM  Result Value Ref Range   Sodium 133 (L) 135 - 145 mmol/L   Potassium 3.8 3.5 - 5.1 mmol/L   Chloride 95 (L) 98 - 111 mmol/L   CO2 26 22 - 32 mmol/L  Glucose, Bld 166 (H) 70 - 99 mg/dL   BUN 13 8 - 23 mg/dL   Creatinine, Ser 3.41 (H) 0.44 - 1.00 mg/dL    Comment: DELTA CHECK NOTED   Calcium 8.4 (L) 8.9 - 10.3 mg/dL   Phosphorus 3.3 2.5 - 4.6 mg/dL   Albumin 1.6 (L) 3.5 - 5.0 g/dL   GFR calc non Af Amer 13 (L) >60 mL/min   GFR calc Af Amer 15 (L) >60 mL/min   Anion gap 12 5 - 15    Comment: Performed at [facility] [address] [place], [place] [address]  Heparin level (unfractionated)     Status: None   Collection Time: 10/31/19  3:58 AM  Result Value Ref Range   Heparin Unfractionated 0.68 0.30 - 0.70 IU/mL    Comment: (NOTE) If heparin results are below expected values, and patient dosage has  been confirmed, suggest follow up testing of antithrombin III levels. Performed at [facility], [place] [address], [place], [place] [address]   CBC     Status: Abnormal   Collection Time: 10/31/19  3:58 AM  Result Value Ref Range   WBC 10.9 (H) 4.0 - 10.5 K/uL   RBC 2.79 (L) 3.87 - 5.11 MIL/uL   Hemoglobin 8.1 (L) 12.0 - 15.0 g/dL   HCT 25.6 (L) 36.0 - 46.0 %   MCV 91.8 80.0 - 100.0 fL   MCH 29.0 26.0 -  34.0 pg   MCHC 31.6 30.0 - 36.0 g/dL   RDW 15.7 (H) 11.5 - 15.5 %   Platelets 197 150 - 400 K/uL   nRBC 0.0 0.0 - 0.2 %    Comment: Performed at [facility], [place] [address], [place], [place] [address]  Protime-INR     Status: Abnormal   Collection Time: 10/31/19  3:58 AM  Result Value Ref Range   Prothrombin Time 29.3 (H) 11.4 - 15.2 seconds   INR 2.8 (H) 0.8 - 1.2    Comment: (NOTE) INR goal varies based on device and disease states. Performed at [facility], [place] [address], [place], [place] [address]   Glucose, capillary     Status: Abnormal   Collection Time: 10/31/19  6:15 AM  Result Value Ref Range   Glucose-Capillary 146 (H) 70 - 99 mg/dL  Glucose, capillary     Status: Abnormal   Collection Time: 10/31/19 11:53 AM  Result Value Ref Range   Glucose-Capillary 189 (H) 70 - 99 mg/dL   Comment 1 Notify RN    Comment 2 Document in Chart   Glucose, capillary     Status: Abnormal   Collection Time: 10/31/19  3:57 PM  Result Value Ref Range   Glucose-Capillary 182 (H) 70 - 99 mg/dL   Comment 1 Notify RN    Comment 2 Document in Chart   Glucose, capillary     Status: Abnormal   Collection Time: 10/31/19  9:23 PM  Result Value Ref Range   Glucose-Capillary 170 (H) 70 - 99 mg/dL   Comment 1 Notify RN   CBC     Status: Abnormal   Collection Time: 11/01/19  4:21 AM  Result Value Ref Range   WBC 8.7 4.0 - 10.5 K/uL   RBC 2.69 (L) 3.87 - 5.11 MIL/uL   Hemoglobin 7.7 (L) 12.0 - 15.0 g/dL   HCT 24.6 (L) 36.0 - 46.0 %   MCV 91.4 80.0 - 100.0 fL   MCH 28.6 26.0 - 34.0  pg   MCHC 31.3 30.0 - 36.0 g/dL   RDW 15.9 (H) 11.5 - 15.5 %   Platelets 206 150 - 400 K/uL   nRBC 0.0 0.0 - 0.2 %    Comment: Performed at [facility], [place] [address], [place], [place] [address]  Protime-INR     Status: Abnormal   Collection Time: 11/01/19  4:21 AM  Result Value Ref Range   Prothrombin Time 28.0 (H) 11.4 - 15.2 seconds   INR 2.6 (H) 0.8 - 1.2    Comment: (NOTE) INR goal  varies based on device and disease states. Performed at [facility], [place] [address] [place], [place] [address]   Basic metabolic panel     Status: Abnormal   Collection Time: 11/01/19  4:21 AM  Result Value Ref Range   Sodium 136 135 - 145 mmol/L   Potassium 4.7 3.5 - 5.1 mmol/L   Chloride 96 (L) 98 - 111 mmol/L   CO2 27 22 - 32 mmol/L   Glucose, Bld 158 (H) 70 - 99 mg/dL   BUN 31 (H) 8 - 23 mg/dL   Creatinine, Ser 5.59 (H) 0.44 - 1.00 mg/dL    Comment: DELTA CHECK NOTED   Calcium 8.9 8.9 - 10.3 mg/dL   GFR calc non Af Amer 7 (L) >60 mL/min   GFR calc Af Amer 8 (L) >60 mL/min   Anion gap 13 5 - 15    Comment: Performed at [facility] [address], [place], [place] [address]  Glucose, capillary     Status: Abnormal   Collection Time: 11/01/19  6:10 AM  Result Value Ref Range   Glucose-Capillary 153 (H) 70 - 99 mg/dL  Glucose, capillary     Status: Abnormal   Collection Time: 11/01/19  1:05 PM  Result Value Ref Range   Glucose-Capillary 126 (H) 70 - 99 mg/dL  Glucose, capillary     Status: Abnormal   Collection Time: 11/01/19  4:26 PM  Result Value Ref Range   Glucose-Capillary 218 (H) 70 - 99 mg/dL   No results found.     Medical Problem List and Plan: 1.  Impaired gait with left side weakness secondary to right basal ganglia infarction as well as small cortical infarct left frontal lobe  -patient may shower  -ELOS/Goals: modI in PT, OT, I in SLP 2.  Antithrombotics: -DVT/anticoagulation: Chronic Coumadin for right IJ acute DVT.  Plan anticoagulation x3 months  -antiplatelet therapy: N/A 3. Pain Management chronic back and left hip pain: Neurontin 100 mg nightly, oxycodone as needed.  TLSO back brace for comfort 4. Mood: Provide emotional support  -antipsychotic agents: N/A 5. Neuropsych: This patient is capable of making decisions on her own behalf. 6. Skin/Wound Care: Routine skin checks 7. Fluids/Electrolytes/Nutrition: Routine in and outs with  follow-up chemistries 8.  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  New HD tunneled catheter placed 10/26/2019. 9.  ID/recurrent Pseudomonas bacteremia.  Continue Maxipime and Cipro as directed with infectious disease to discuss duration of antibiotic therapies anticipate 6 weeks. 10.  Hemodialysis MWF.  Follow-up per renal services 11.  Diabetes mellitus.  Hemoglobin A1c 7.6.  SSI. 12.  Hypertension.  Imdur 30 mg daily, Coreg 6.25 mg twice daily.  Monitor with increased mobility 13.  Acute on chronic anemia.  Follow-up CBC 14. Disposition: Upon discharge, patient should have follow-up with PCP, physiatry, infectious disease, and neurology.    Lavon Paganini  [place], PA-C 11/01/2019   I have personally performed a face to face diagnostic evaluation, including, but not limited to relevant history and physical exam findings, of this patient and developed relevant assessment and plan.  Additionally, I have reviewed and concur with the physician assistant's documentation above.  The patient's status has not changed. The original post admission physician evaluation remains appropriate, and any changes from the pre-admission screening or documentation from the acute chart are noted above.   Leeroy Cha, MD

## 2019-11-01 NOTE — Discharge Summary (Signed)
Physician Discharge Summary  [name] XFG:[id number] DOB: 1951-06-15 DOA: 10/20/2019  PCP: Manon Hilding, MD  Admit date: 10/20/2019 Discharge date: 11/01/2019  Recommendations for Outpatient Follow-up:  1. Follow up with infectious disease upon discharge from rehab. 2. Continue dialysis MWF. 3. PT/OT per rehab 4. Follow up with PCP upon discharge from rehab. 5. Follow up with Dr. Prescott Gum in one month  Follow-up Information    [facility]. Schedule an appointment as soon as possible for a visit in 4 week(s).   Specialty: Neurology Contact information: [address] [place] [phone]         Discharge Diagnoses: Principal diagnosis is #1 1. Mitral Valve Endocarditis and Vegetation,  2. Pseudomonas bacteremia 3. Acute right basal ganglia infarct 4. Acute left frontal lobe infarct 5. Right IJ DVT 6. ESRD on HD 7. DM II 8. Hypertension 9. Septic thrombophlebitis  Discharge Condition: Fair  Disposition: Inpatient Rehab  Diet recommendation: Heart healthy/Carbohydrate controlled.  Filed Weights   10/30/19 1938 11/01/19 0405 11/01/19 0740  Weight: 61.6 kg 62 kg 62.6 kg   History of present illness:  [name] is a 68 y.o. female with medical history of ESRD (MWF), diabetes mellitus type 2, stroke, hypertension, hyperlipidemia presenting with left-sided weakness.  The patient was her usual self around 9 PM when she went to bed on the evening of 10/19/2019.  However when she woke up to get ready for dialysis at 4 AM on 10/20/2019, the patient noted left-sided weakness and had difficulty ambulating.  She stated that she was leaning toward the left side.  The patient endorses compliance with all her medications including aspirin and Plavix.  She had denied any fevers, chills, chest pain, shortness of breath, coughing, hemoptysis, nausea, vomiting, diarrhea, abdominal pain.  She has been compliant with  dialysis.  Her last dialysis was on 10/18/2019.  She denied any headaches, visual disturbance, word finding difficulties or dysarthria. In the emergency department, the patient was afebrile hemodynamically stable saturating 100% on room air.  BMP showed a potassium 4.4, serum creatinine 7.76.  LFTs were unremarkable.  WBC 15.7 with hemoglobin 9.7 and platelets 240,000.  CT of the brain shows an acute right basal ganglia infarct.  Neurology was consulted and agreed to see the patient in consult once the patient was transferred to [facility].  Nephrology was consulted also for maintenance dialysis.  Hospital Course:  Diana McCorkleis a 68 y.o.femalewith medical history ofESRD (MWF), diabetes mellitus type 2, stroke, hypertension, hyperlipidemia presentedwith left-sided weakness,when she woke up to get ready for dialysis at 4 AM on 10/20/2019, the patient noted left-sided weakness and had difficulty ambulating. [name] was found to have right basal ganglia infarcts on MRI. Her hospitalization complicated by fevers of 101, and finding of right IJ DVT on Dopplers. She will require 3 months of anticoagulation for this. She is now found to have recurrent Pseudomonas bacteremia. Right IJ  HD catheter was removed on 10/23/2019. TEE performed on 10/26/2019 demonstrates large vegetation of mitral valve. CTS consulted. Recent history of stroke raises a possible barrier to near-term surgical intervention.  Permissive hypertension is in effect.  Pt had new tunneled catheter placed on 10/26/2019 for the continuation of HD. She went to HD last night. Following HD she had altered mental status. MRI was performed. It demonstrated chronic multiple small ischemic strokes and interval development of a left 4 mm cortical infarct.   Dr. Prescott Gum for CTS feels that the best course  at this time is continuation of IV antibiotics. He does not feel that there is a currently a role for mitral valve repair/replacement due to risk  for recurrent infection of a new bioprosthetic valve. She will need to have negative blood cultures for 3 weeks before surgery could be considered. She will also need improvement in her functional/nutritional status.  Currently Infectious Disease is recommending that the patient continue on daily cefepime and oral cipro while in rehab. Following rehab they recommend that the patient continue oral cipro daily with change in dosing of cefepime to 2 gm IV after dialysis on Monday and Wednesday with 3 gm IV after HD on Fridays. Right now they are suggesting 12/06/2019 as the last day of antibiotic therapy. Pharmacy will follow the patient's antibiotics and the patient will follow up with infectious disease after discharge from rehab.  Dental Panorex could not be performed due to patient's ability to comply with exam. Tagged RBC scan negative on 10/27/2019.  She has been cleared for progression to inpatient rehab. She will be discharged there today.  Today's assessment: S: The patient is seen following dialysis. She is without new complaints. O: Vitals:  Vitals:   11/01/19 1212 11/01/19 1308  BP: (!) 173/82 (!) 141/90  Pulse: 91 87  Resp: 15 18  Temp: 98.2 F (36.8 C) 99.3 F (37.4 C)  SpO2:  98%   Constitutional:   The patient is awake and alert and oriented x 3. No acute distress. Eyes:   EOMBI  PERRLA  Normal lids and conjunctivae Respiratory:   No increased work of breathing.  No wheezes, rales, or rhonchi  No tactile fremitus Cardiovascular:   Regular rate and rhythm  No murmurs, ectopy, or gallups.  No lateral PMI. No thrills. Abdomen:   Abdomen is soft, non-tender, non-distended  No hernias, masses, or organomegaly  Normoactive bowel sounds.  Musculoskeletal:   No cyanosis, clubbing, or edema Skin:   No rashes, lesions, ulcers  palpation of skin: no induration or nodules Neurologic:   CN 2-12 intact  Sensation all 4 extremities intact Psychiatric:     Mental status: Confused   Discharge Instructions  Discharge Instructions    Activity as tolerated - No restrictions   Complete by: As directed    Call MD for:  difficulty breathing, headache or visual disturbances   Complete by: As directed    Call MD for:  extreme fatigue   Complete by: As directed    Call MD for:  temperature >100.4   Complete by: As directed    Diet - low sodium heart healthy   Complete by: As directed    Discharge instructions   Complete by: As directed    Follow up with infectious disease upon discharge from rehab. Continue dialysis MWF. PT/OT per rehab Follow up with PCP upon discharge from rehab.   Home infusion instructions [facility] May follow [place] Dosing Protocol; May administer Cathflo as needed to maintain patency of vascular access device.; Flushing of vascular access device: per [facility] Protocol: 0.9% NaCl pre/post medica...   Complete by: As directed    Instructions: May follow [place] Dosing Protocol   Instructions: May administer Cathflo as needed to maintain patency of vascular access device.   Instructions: Flushing of vascular access device: per [facility] Protocol: 0.9% NaCl pre/post medication administration and prn patency; Heparin 100 u/ml, 54m for implanted ports and Heparin 10u/ml, 541mfor all other central venous catheters.   Instructions: May follow [facility] Anaphylaxis Protocol  for First Dose Administration in the home: 0.9% NaCl at 25-50 ml/hr to maintain IV access for protocol meds. Epinephrine 0.3 ml IV/IM PRN and Benadryl 25-50 IV/IM PRN s/s of anaphylaxis.   Instructions: [facility] Infusion Coordinator (RN) to assist per patient IV care needs in the home PRN.   Increase activity slowly   Complete by: As directed      Allergies as of 11/01/2019   No Known Allergies     Medication List    STOP taking these medications   amLODipine 10 MG tablet Commonly known as: NORVASC   clopidogrel 75 MG tablet Commonly  known as: Plavix   methocarbamol 500 MG tablet Commonly known as: ROBAXIN   predniSONE 20 MG tablet Commonly known as: DELTASONE   traMADol 50 MG tablet Commonly known as: ULTRAM     TAKE these medications   Accu-Chek Softclix Lancets lancets   aspirin EC 81 MG tablet Take 81 mg by mouth daily after lunch.   B-D SINGLE USE SWABS REGULAR Pads   calcium carbonate 500 MG chewable tablet Commonly known as: TUMS - dosed in mg elemental calcium Chew 1 tablet by mouth 2 (two) times daily. With lunch & supper   carvedilol 6.25 MG tablet Commonly known as: COREG Take 6.25 mg by mouth 2 (two) times daily.   ceFEPime  IVPB Commonly known as: MAXIPIME Inject 1 g into the vein daily. Cefepime should always be given in the afternoon and ALWAYS after dialysis. Indication:  Mitral valve endocarditis Last Day of Therapy:  12/06/2019 Labs - Once weekly:  CBC/D and BMP, Labs - Every other week:  ESR and CRP   ciprofloxacin 500 MG tablet Commonly known as: CIPRO Take 1 tablet (500 mg total) by mouth daily at 6 PM.   doxercalciferol 4 MCG/2ML injection Commonly known as: HECTOROL Inject 0.75 mLs (1.5 mcg total) into the vein every Monday, Wednesday, and Friday with hemodialysis.   feeding supplement (ENSURE ENLIVE) Liqd Take 237 mLs by mouth 2 (two) times daily between meals.   fluticasone 50 MCG/ACT nasal spray Commonly known as: FLONASE Place 1 spray into both nostrils daily as needed for allergies or rhinitis.   gabapentin 100 MG capsule Commonly known as: NEURONTIN Take 1 capsule (100 mg total) by mouth at bedtime.   insulin NPH-regular Human (70-30) 100 UNIT/ML injection Inject 4 Units into the skin 2 (two) times daily with a meal. What changed:   how much to take  when to take this  additional instructions   isosorbide mononitrate 30 MG 24 hr tablet Commonly known as: IMDUR Take 30 mg by mouth daily after lunch.   multivitamin Tabs tablet Take 1 tablet by mouth  daily after lunch.   oxyCODONE 5 MG immediate release tablet Commonly known as: Oxy IR/ROXICODONE Take 1 tablet (5 mg total) by mouth every 6 (six) hours as needed for moderate pain.   pantoprazole 40 MG tablet Commonly known as: PROTONIX Take 1 tablet by mouth daily as needed.   PEG 3350 17 GM/SCOOP Powd Take 17 g by mouth daily as needed.   senna-docusate 8.6-50 MG tablet Commonly known as: Senokot-S Take 1 tablet by mouth at bedtime as needed for mild constipation.   warfarin 2.5 MG tablet Commonly known as: COUMADIN Take 1 tablet (2.5 mg total) by mouth daily at 6 PM.            Home Infusion Instuctions  (From admission, onward)         Start  Ordered   11/01/19 0000  Home infusion instructions [facility] May follow [place] Dosing Protocol; May administer Cathflo as needed to maintain patency of vascular access device.; Flushing of vascular access device: per [facility] Protocol: 0.9% NaCl pre/post medica...    Question Answer Comment  Instructions May follow [place] Dosing Protocol   Instructions May administer Cathflo as needed to maintain patency of vascular access device.   Instructions Flushing of vascular access device: per [facility] Protocol: 0.9% NaCl pre/post medication administration and prn patency; Heparin 100 u/ml, 35m for implanted ports and Heparin 10u/ml, 567mfor all other central venous catheters.   Instructions May follow [facility] Anaphylaxis Protocol for First Dose Administration in the home: 0.9% NaCl at 25-50 ml/hr to maintain IV access for protocol meds. Epinephrine 0.3 ml IV/IM PRN and Benadryl 25-50 IV/IM PRN s/s of anaphylaxis.   Instructions [facility] Infusion Coordinator (RN) to assist per patient IV care needs in the home PRN.      11/01/19 1024         No Known Allergies  The results of significant diagnostics from this hospitalization (including imaging, microbiology, ancillary and laboratory) are listed below for  reference.    Significant Diagnostic Studies: Dg Chest 1 View  Result Date: 10/20/2019 CLINICAL DATA:  Left-sided weakness EXAM: CHEST  1 VIEW COMPARISON:  September 09, 2019 FINDINGS: Right tunneled central line is unchanged. Low lung volumes. No new consolidation or edema. Possible trace left pleural effusion. Stable cardiomediastinal contours. IMPRESSION: Possible trace left pleural effusion. Electronically Signed   By: PrMacy Mis.D.   On: 10/20/2019 11:45   Ct Head Wo Contrast  Result Date: 10/27/2019 CLINICAL DATA:  [age]ear old female with altered mental status. Acute right basal ganglia infarcts on recent MRI. EXAM: CT HEAD WITHOUT CONTRAST TECHNIQUE: Contiguous axial images were obtained from the base of the skull through the vertex without intravenous contrast. [facility] FINDINGS: Brain: Expected mild evolution of hypodensity in the right corona radiata and caudate head since 10/20/2019. No associated hemorrhage or mass effect. New hypodensity in the anterior left corona radiata or centrum semiovale mere the left frontal horn on series 3, image 23. No regional mass effect. Elsewhere stable gray-white matter differentiation. No acute cortically based infarct. No ventriculomegaly. Normal basilar cisterns. Vascular: Calcified atherosclerosis at the skull base. No suspicious intracranial vascular hyperdensity. Skull: No acute osseous abnormality identified. Sinuses/Orbits: Stable periosteal thickening. Visualized paranasal sinuses and mastoids are stable and well pneumatized. Other: No acute orbit or scalp soft tissue finding. IMPRESSION: 1. Evidence of a new small left frontal lobe white matter infarct since the MRI on 10/20/2019, near the left frontal horn. No associated hemorrhage or mass effect. 2. Expected appearance of the recent right basal ganglia and right corona radiata infarcts. 3. These results were communicated to Dr. ArLorraine Laxt  1:21 am on 12/4/2020by text page via the [facility] system. Electronically Signed   By: H Genevie Ann.D.   On: 10/27/2019 01:22   Ct Head Wo Contrast  Result Date: 10/20/2019 CLINICAL DATA:  Focal neuro deficit, stroke suspected. Dizziness and weakness. EXAM: CT HEAD WITHOUT CONTRAST TECHNIQUE: Contiguous axial images were obtained from the base of the skull through the vertex without intravenous contrast. COMPARISON:  09/10/2019 FINDINGS: Brain: There are acute infarcts involving the head and body of the right caudate nucleus and adjacent white matter. No intracranial hemorrhage, mass, midline shift, or extra-axial fluid collection is identified.  The ventricles and sulci are within normal limits for age. Hypodensities in the cerebral white matter bilaterally are unchanged and nonspecific but compatible with mild chronic small vessel ischemic disease. Vascular: Calcified atherosclerosis at the skull base. No hyperdense vessel. Skull: No fracture or focal osseous lesion. Sinuses/Orbits: Visualized paranasal sinuses and mastoid air cells are clear. Right cataract extraction is noted. Other: None. IMPRESSION: 1. Acute right basal ganglia infarcts. 2. Mild chronic small vessel ischemic disease. Electronically Signed   By: Logan Bores M.D.   On: 10/20/2019 06:53   Mr Angio Head Wo Contrast  Result Date: 10/20/2019 CLINICAL DATA:  Left-sided weakness. EXAM: MRI HEAD WITHOUT CONTRAST MRA HEAD WITHOUT CONTRAST TECHNIQUE: Multiplanar, multiecho pulse sequences of the brain and surrounding structures were obtained without intravenous contrast. Angiographic images of the head were obtained using MRA technique without contrast. COMPARISON:  Head CT 10/20/2019 and MRI 07/19/2019 FINDINGS: MRI HEAD FINDINGS Brain: As seen on today's head CT, they are multiple acute infarcts involving the right basal ganglia and adjacent white matter. A few subcentimeter foci of mild trace diffusion weighted signal hyperintensity in  the left parietal and posterior left frontal white matter are without reduced ADC and likely reflect subacute to chronic ischemia. A 4 mm cortical infarct in the left postcentral gyrus is new from the prior MRI but not acute. T2 hyperintensities elsewhere in the cerebral white matter bilaterally are unchanged and nonspecific but compatible with mild chronic small vessel ischemic disease. Small chronic infarcts in the left cerebellum and left thalamus are unchanged. Mild cerebral atrophy is within normal limits for age. No intracranial hemorrhage, mass, midline shift, or extra-axial fluid collection is identified. Vascular: Major intracranial vascular flow voids are preserved. Skull and upper cervical spine: No suspicious marrow lesion. Sinuses/Orbits: Right cataract extraction. No evidence of significant inflammatory disease in the paranasal sinuses. Left petrous apex effusion. Other: None. MRA HEAD FINDINGS The visualized distal vertebral arteries are patent to the basilar and codominant. Patent right PICA, bilateral AICA, and bilateral SCA origins are visualized. The basilar artery is widely patent. There is a large left posterior communicating artery with hypoplastic or absent left P1 segment, a normal variant. Both PCAs are patent without evidence of significant proximal stenosis. The internal carotid arteries are patent from skull base to carotid termini without definite stenosis. Focally decreased signal in the right petrous ICA is likely artifactual related to pneumatization of the right petrous apex. ACAs and MCAs are patent without evidence of proximal branch occlusion or significant proximal stenosis. No aneurysm is identified. IMPRESSION: 1. Acute right basal ganglia infarcts. 2. Chronic ischemia with multiple old infarcts as above. 3. Negative head MRA. Electronically Signed   By: Logan Bores M.D.   On: 10/20/2019 11:46   Mr Brain Wo Contrast  Result Date: 10/20/2019 CLINICAL DATA:  Left-sided  weakness. EXAM: MRI HEAD WITHOUT CONTRAST MRA HEAD WITHOUT CONTRAST TECHNIQUE: Multiplanar, multiecho pulse sequences of the brain and surrounding structures were obtained without intravenous contrast. Angiographic images of the head were obtained using MRA technique without contrast. COMPARISON:  Head CT 10/20/2019 and MRI 07/19/2019 FINDINGS: MRI HEAD FINDINGS Brain: As seen on today's head CT, they are multiple acute infarcts involving the right basal ganglia and adjacent white matter. A few subcentimeter foci of mild trace diffusion weighted signal hyperintensity in the left parietal and posterior left frontal white matter are without reduced ADC and likely reflect subacute to chronic ischemia. A 4 mm cortical infarct in the left postcentral gyrus is new from  the prior MRI but not acute. T2 hyperintensities elsewhere in the cerebral white matter bilaterally are unchanged and nonspecific but compatible with mild chronic small vessel ischemic disease. Small chronic infarcts in the left cerebellum and left thalamus are unchanged. Mild cerebral atrophy is within normal limits for age. No intracranial hemorrhage, mass, midline shift, or extra-axial fluid collection is identified. Vascular: Major intracranial vascular flow voids are preserved. Skull and upper cervical spine: No suspicious marrow lesion. Sinuses/Orbits: Right cataract extraction. No evidence of significant inflammatory disease in the paranasal sinuses. Left petrous apex effusion. Other: None. MRA HEAD FINDINGS The visualized distal vertebral arteries are patent to the basilar and codominant. Patent right PICA, bilateral AICA, and bilateral SCA origins are visualized. The basilar artery is widely patent. There is a large left posterior communicating artery with hypoplastic or absent left P1 segment, a normal variant. Both PCAs are patent without evidence of significant proximal stenosis. The internal carotid arteries are patent from skull base to  carotid termini without definite stenosis. Focally decreased signal in the right petrous ICA is likely artifactual related to pneumatization of the right petrous apex. ACAs and MCAs are patent without evidence of proximal branch occlusion or significant proximal stenosis. No aneurysm is identified. IMPRESSION: 1. Acute right basal ganglia infarcts. 2. Chronic ischemia with multiple old infarcts as above. 3. Negative head MRA. Electronically Signed   By: Logan Bores M.D.   On: 10/20/2019 11:46   Mr Hip Left Wo Contrast  Result Date: 10/25/2019 CLINICAL DATA:  Chronic left hip pain.  Pseudomonas bacteremia. EXAM: MR OF THE LEFT HIP WITHOUT CONTRAST TECHNIQUE: Multiplanar, multisequence MR imaging was performed. No intravenous contrast was administered. COMPARISON:  Prior MRI lumbar spines and CT scan abdomen/pelvis 09/10/2019 FINDINGS: Diffuse marrow abnormality as demonstrated on prior CT and MR examinations most likely due to renal osteodystrophy. This is most notable in the spine. I do not see any findings suspicious for osteomyelitis. The pubic symphysis and SI joints are intact without findings suspicious for septic arthritis. Both hips are normally located. No stress fracture or AVN. Mild bilateral hip joint degenerative changes. Diffuse edema like signal changes involving the paraspinal and psoas muscles, likely nonspecific myositis. I do not see any muscle tears or muscle lesions. The hamstring tendons are intact. There is mild bilateral hip peritendinitis but no findings for trochanteric bursitis. No significant intrapelvic abnormalities are identified. No mass or adenopathy. IMPRESSION: 1. Diffuse marrow abnormality likely due to renal osteodystrophy. No worrisome bone lesions or findings suspicious for osteomyelitis or septic arthritis. 2. Nonspecific edema like signal changes in the paraspinal and psoas muscles. 3. No significant intrapelvic abnormalities. Electronically Signed   By: Marijo Sanes  M.D.   On: 10/25/2019 07:39   Ir Fluoro Guide Cv Line Left  Result Date: 10/26/2019 INDICATION: End-stage renal disease. In need of durable intravenous access for continuation of dialysis. Previous dialysis catheter was removed on 10/23/2023 secondary to concern for infection or versus infectious symptoms have subsequently resolved. EXAM: TUNNELED CENTRAL VENOUS HEMODIALYSIS CATHETER PLACEMENT WITH ULTRASOUND AND FLUOROSCOPIC GUIDANCE MEDICATIONS: Ancef 2 gm IV . The antibiotic was given in an appropriate time interval prior to skin puncture. ANESTHESIA/SEDATION: Versed 1 mg IV; Fentanyl 50 mcg IV; Moderate Sedation Time:  14 minutes The patient was continuously monitored during the procedure by the interventional radiology nurse under my direct supervision. FLUOROSCOPY TIME:  24 seconds (3 mGy) COMPLICATIONS: None immediate. PROCEDURE: Informed written consent was obtained from the patient after a discussion of the risks,  benefits, and alternatives to treatment. Questions regarding the procedure were encouraged and answered. The right neck and chest were prepped with chlorhexidine in a sterile fashion, and a sterile drape was applied covering the operative field. Maximum barrier sterile technique with sterile gowns and gloves were used for the procedure. A timeout was performed prior to the initiation of the procedure. Ultrasound evaluation the right neck demonstrates nonocclusive thrombus within the right internal jugular vein however note was made of a prominent right external jugular vein as such the decision was made to place a right external jugular approach hemodialysis catheter. After creating a small venotomy incision, a micropuncture kit was utilized to access the external jugular vein. Real-time ultrasound guidance was utilized for vascular access including the acquisition of a permanent ultrasound image documenting patency of the accessed vessel. The microwire was utilized to measure appropriate  catheter length. A stiff Glidewire was advanced to the level of the IVC and the micropuncture sheath was exchanged for a peel-away sheath. A palindrome tunneled hemodialysis catheter measuring 19 cm from tip to cuff was tunneled in a retrograde fashion from the anterior chest wall to the venotomy incision. The catheter was then placed through the peel-away sheath with tips ultimately positioned within the superior aspect of the right atrium. Final catheter positioning was confirmed and documented with a spot radiographic image. The catheter aspirates and flushes normally. The catheter was flushed with appropriate volume heparin dwells. The catheter exit site was secured with a 0-Prolene retention suture. The venotomy incision was closed with Dermabond and Steri-strips. Dressings were applied. The patient tolerated the procedure well without immediate post procedural complication. IMPRESSION: Successful placement of 19 cm tip to cuff tunneled hemodialysis catheter via the right external jugular vein with tips terminating within the superior aspect of the right atrium. The catheter is ready for immediate use. Electronically Signed   By: Sandi Mariscal M.D.   On: 10/26/2019 17:18   Ir Removal Tun Cv Cath W/o Fl  Result Date: 10/24/2019 INDICATION: Patient with history of ESRD on HD s/p tunneled right IJ HD catheter placement in OR 07/25/2019 by Dr. Constance Haw. Patient currently admitted to [facility] and found to have recurrence Pseudomonas bacteremia. Request is made for removal of tunneled hemodialysis catheter for line holiday. EXAM: REMOVAL OF TUNNELED HEMODIALYSIS CATHETER MEDICATIONS: None COMPLICATIONS: None immediate. PROCEDURE: Informed written consent was obtained from the patient following an explanation of the procedure, risks, benefits and alternatives to treatment. A time out was performed prior to the initiation of the procedure. Maximal barrier sterile technique was utilized including mask, sterile gloves, large  sterile drape, hand hygiene, and Hibiclens. Utilizing gentle traction, the catheter was removed intact. Hemostasis was obtained with manual compression. A dressing was placed. The patient tolerated the procedure well without immediate post procedural complication. IMPRESSION: Successful removal of tunneled dialysis catheter. Read by: Earley Abide, PA-C Electronically Signed   By: Lucrezia Europe M.D.   On: 10/23/2019 16:01   Ir US Guide Vasc Access Left  Result Date: 10/27/2019 INDICATION: End-stage renal disease. In need of durable intravenous access for continuation of dialysis. Previous dialysis catheter was removed on 10/23/2023 secondary to concern for infection or versus infectious symptoms have subsequently resolved. EXAM: TUNNELED CENTRAL VENOUS HEMODIALYSIS CATHETER PLACEMENT WITH ULTRASOUND AND FLUOROSCOPIC GUIDANCE MEDICATIONS: Ancef 2 gm IV . The antibiotic was given in an appropriate time interval prior to skin puncture. ANESTHESIA/SEDATION: Versed 1 mg IV; Fentanyl 50 mcg IV; Moderate Sedation Time:  14 minutes The patient was continuously  monitored during the procedure by the interventional radiology nurse under my direct supervision. FLUOROSCOPY TIME:  24 seconds (3 mGy) COMPLICATIONS: None immediate. PROCEDURE: Informed written consent was obtained from the patient after a discussion of the risks, benefits, and alternatives to treatment. Questions regarding the procedure were encouraged and answered. The right neck and chest were prepped with chlorhexidine in a sterile fashion, and a sterile drape was applied covering the operative field. Maximum barrier sterile technique with sterile gowns and gloves were used for the procedure. A timeout was performed prior to the initiation of the procedure. Ultrasound evaluation the right neck demonstrates nonocclusive thrombus within the right internal jugular vein however note was made of a prominent right external jugular vein as such the decision was made  to place a right external jugular approach hemodialysis catheter. After creating a small venotomy incision, a micropuncture kit was utilized to access the external jugular vein. Real-time ultrasound guidance was utilized for vascular access including the acquisition of a permanent ultrasound image documenting patency of the accessed vessel. The microwire was utilized to measure appropriate catheter length. A stiff Glidewire was advanced to the level of the IVC and the micropuncture sheath was exchanged for a peel-away sheath. A palindrome tunneled hemodialysis catheter measuring 19 cm from tip to cuff was tunneled in a retrograde fashion from the anterior chest wall to the venotomy incision. The catheter was then placed through the peel-away sheath with tips ultimately positioned within the superior aspect of the right atrium. Final catheter positioning was confirmed and documented with a spot radiographic image. The catheter aspirates and flushes normally. The catheter was flushed with appropriate volume heparin dwells. The catheter exit site was secured with a 0-Prolene retention suture. The venotomy incision was closed with Dermabond and Steri-strips. Dressings were applied. The patient tolerated the procedure well without immediate post procedural complication. IMPRESSION: Successful placement of 19 cm tip to cuff tunneled hemodialysis catheter via the right external jugular vein with tips terminating within the superior aspect of the right atrium. The catheter is ready for immediate use. Electronically Signed   By: Sandi Mariscal M.D.   On: 10/26/2019 17:18   Vas [name] Transcranial Doppler W Bubbles  Result Date: 10/24/2019  Transcranial Doppler with Bubble Indications: Stroke. Comparison Study: No prior study. Performing Technologist: Maudry Mayhew MHA, RDMS, RVT, RDCS  Examination Guidelines: A complete evaluation includes B-mode imaging, spectral Doppler, color Doppler, and power Doppler as needed of all  accessible portions of each vessel. Bilateral testing is considered an integral part of a complete examination. Limited examinations for reoccurring indications may be performed as noted.  Summary:  A vascular evaluation was performed. The right ICA siphon was studied. An IV was inserted into the patient's right forearm. Verbal informed consent was obtained.  No obvious evidence of high intensity transient signals (HITS), therefore no evidence of clinically significant patent foramen ovale (PFO). Negative TCD Bubble study *See table(s) above for measurements and observations.  Diagnosing physician: Antony Contras MD Electronically signed by Antony Contras MD on 10/24/2019 at 8:15:53 AM.    Final    Nm Wbc Scan Tumor Loc Limited  Result Date: 10/27/2019 CLINICAL DATA:  Evaluate for occult infection EXAM: NUCLEAR MEDICINE LEUKOCYTE SCAN TECHNIQUE: Following intravenous administration of radiolabeled white blood cells, images of the head, neck, trunk, and extremities were obtained on subsequent days. RADIOPHARMACEUTICALS:  35.3 millicuries Tc 99 Ceretec labeled autologous leukocytes IV COMPARISON:  None. FINDINGS: There is a normal physiologic douche profusion of the radiopharmaceutical with  radiotracer activity noted within the bone marrow, liver and spleen. There is increased radiotracer uptake within both lungs which may be due to poor cardiac output. Speckled areas of increased uptake in the right lower quadrant of the abdomen and in the region of the distal aspect of the right lower extremity noted, favor contamination artifact. No abnormal increased radiotracer uptake to identify suspected site of occult infection. IMPRESSION: 1. No focal areas of increased uptake identified to indicate site of suspected occult infection. Electronically Signed   By: Kerby Moors M.D.   On: 10/27/2019 20:59   Vas US Carotid  Result Date: 10/24/2019 Carotid Arterial Duplex Study Indications:       CVA and Weakness. Risk  Factors:      Hypertension, hyperlipidemia, Diabetes, prior CVA. Other Factors:     ESRD. Comparison Study:  No prior study on file for comparison Performing Technologist: Maudry Mayhew RDMS, RVT, RDCS  Examination Guidelines: A complete evaluation includes B-mode imaging, spectral Doppler, color Doppler, and power Doppler as needed of all accessible portions of each vessel. Bilateral testing is considered an integral part of a complete examination. Limited examinations for reoccurring indications may be performed as noted.  Right Carotid Findings: +----------+--------+--------+--------+------------------+------------------+             PSV cm/s EDV cm/s Stenosis Plaque Description Comments            +----------+--------+--------+--------+------------------+------------------+  CCA Prox   78       14                                   intimal thickening  +----------+--------+--------+--------+------------------+------------------+  CCA Distal 70       11                                   intimal thickening  +----------+--------+--------+--------+------------------+------------------+  ICA Prox   54       13                calcific           Shadowing           +----------+--------+--------+--------+------------------+------------------+  ICA Distal 79       24                                                       +----------+--------+--------+--------+------------------+------------------+  ECA        74       4                                                        +----------+--------+--------+--------+------------------+------------------+ +----------+--------+-------+--------+-------------------+             PSV cm/s EDV cms Describe Arm Pressure (mmHG)  +----------+--------+-------+--------+-------------------+  Subclavian 100                                            +----------+--------+-------+--------+-------------------+ +---------+--------+--+--------+--+  Vertebral PSV cm/s 62 EDV cm/s 10   +---------+--------+--+--------+--+  Left Carotid Findings: +----------+--------+--------+--------+------------------+------------------+             PSV cm/s EDV cm/s Stenosis Plaque Description Comments            +----------+--------+--------+--------+------------------+------------------+  CCA Prox   111      9                                    intimal thickening  +----------+--------+--------+--------+------------------+------------------+  CCA Distal 76       12                                   intimal thickening  +----------+--------+--------+--------+------------------+------------------+  ICA Prox   55       15                heterogenous       Shadowing           +----------+--------+--------+--------+------------------+------------------+  ICA Distal 97       27                                                       +----------+--------+--------+--------+------------------+------------------+  ECA        67       9                                                        +----------+--------+--------+--------+------------------+------------------+ +----------+--------+--------+--------+-------------------+             PSV cm/s EDV cm/s Describe Arm Pressure (mmHG)  +----------+--------+--------+--------+-------------------+  Subclavian 145                                             +----------+--------+--------+--------+-------------------+ +---------+--------+--+--------+--+  Vertebral PSV cm/s 38 EDV cm/s 10  +---------+--------+--+--------+--+  Summary: Right Carotid: Velocities in the right ICA are consistent with a 1-39% stenosis. Left Carotid: Velocities in the left ICA are consistent with a 1-39% stenosis. Vertebrals:  Bilateral vertebral arteries demonstrate antegrade flow. Subclavians: Normal flow hemodynamics were seen in bilateral subclavian              arteries. *See table(s) above for measurements and observations.  Electronically signed by Antony Contras MD on 10/24/2019 at 8:14:07 AM.     Final    Vas [name] Lower Extremity Venous (dvt)  Result Date: 10/22/2019  Lower Venous Study Indications: Stroke.  Limitations: Body habitus. Comparison Study: No prior study on file for comparison Performing Technologist: Maudry Mayhew RDMS, RVT, RDCS  Examination Guidelines: A complete evaluation includes B-mode imaging, spectral Doppler, color Doppler, and power Doppler as needed of all accessible portions of each vessel. Bilateral testing is considered an integral part of a complete examination. Limited examinations for reoccurring indications may be performed as noted.  +---------+---------------+---------+-----------+----------+--------------+  RIGHT     Compressibility Phasicity Spontaneity Properties Thrombus Aging  +---------+---------------+---------+-----------+----------+--------------+  CFV       Full            Yes       Yes                                    +---------+---------------+---------+-----------+----------+--------------+  SFJ       Full                                                             +---------+---------------+---------+-----------+----------+--------------+  FV Prox   Full                                                             +---------+---------------+---------+-----------+----------+--------------+  FV Mid    Full                                                             +---------+---------------+---------+-----------+----------+--------------+  FV Distal Full                                                             +---------+---------------+---------+-----------+----------+--------------+  PFV       Full                                                             +---------+---------------+---------+-----------+----------+--------------+  POP       Full            No        No                                     +---------+---------------+---------+-----------+----------+--------------+  PTV       Full                                                              +---------+---------------+---------+-----------+----------+--------------+  PERO      Full                                                             +---------+---------------+---------+-----------+----------+--------------+   +---------+---------------+---------+-----------+----------+--------------+  LEFT      Compressibility Phasicity Spontaneity Properties Thrombus Aging  +---------+---------------+---------+-----------+----------+--------------+  CFV       Full            Yes       Yes                                    +---------+---------------+---------+-----------+----------+--------------+  SFJ       Full                                                             +---------+---------------+---------+-----------+----------+--------------+  FV Prox   Full                                                             +---------+---------------+---------+-----------+----------+--------------+  FV Mid    Full                                                             +---------+---------------+---------+-----------+----------+--------------+  FV Distal Full                                                             +---------+---------------+---------+-----------+----------+--------------+  PFV       Full                                                             +---------+---------------+---------+-----------+----------+--------------+  POP       Full            Yes       Yes                                    +---------+---------------+---------+-----------+----------+--------------+  PTV                                                        Not visualized  +---------+---------------+---------+-----------+----------+--------------+  PERO                                                       Not  visualized  +---------+---------------+---------+-----------+----------+--------------+   Left Technical Findings: Not visualized segments include Posterior and peroneal veins.   Summary: Right: There is  no evidence of deep vein thrombosis in the lower extremity. Left: There is no evidence of deep vein thrombosis in the lower extremity. However, portions of this examination were limited- see technologist comments above.  *See table(s) above for measurements and observations. Electronically signed by Curt Jews MD on 10/22/2019 at 7:49:26 AM.    Final    Vas [name] Upper Extremity Venous Duplex  Result Date: 10/22/2019 UPPER VENOUS STUDY  Indications: Incidental finding of IJ thrombus while performing carotid duplex. Stroke. Comparison Study: No prior study on file Performing Technologist: Maudry Mayhew RDMS, RVT, RDCS  Examination Guidelines: A complete evaluation includes B-mode imaging, spectral Doppler, color Doppler, and power Doppler as needed of all accessible portions of each vessel. Bilateral testing is considered an integral part of a complete examination. Limited examinations for reoccurring indications may be performed as noted.  Right Findings: +----------+------------+---------+-----------+----------+-----------------+  RIGHT      Compressible Phasicity Spontaneous Properties      Summary       +----------+------------+---------+-----------+----------+-----------------+  IJV            None        No         No                       Acute        +----------+------------+---------+-----------+----------+-----------------+  Subclavian     Full        Yes        Yes                                   +----------+------------+---------+-----------+----------+-----------------+  Axillary       Full        Yes        Yes                                   +----------+------------+---------+-----------+----------+-----------------+  Brachial     Partial       No         No                 Age Indeterminate  +----------+------------+---------+-----------+----------+-----------------+  Radial         Full                                                          +----------+------------+---------+-----------+----------+-----------------+  Ulnar          Full                                                         +----------+------------+---------+-----------+----------+-----------------+  Cephalic       Full                                                         +----------+------------+---------+-----------+----------+-----------------+  Basilic                                                   Not visualized    +----------+------------+---------+-----------+----------+-----------------+  Left Findings: +----------+------------+---------+-----------+----------+-------+  LEFT       Compressible Phasicity Spontaneous Properties Summary  +----------+------------+---------+-----------+----------+-------+  Subclavian                 Yes        Yes                         +----------+------------+---------+-----------+----------+-------+  Summary:  Right: No evidence of superficial vein thrombosis in the upper extremity. Findings consistent with acute deep vein thrombosis involving the right internal jugular vein. Findings consistent with age indeterminate deep vein thrombosis involving the one of the paired veins of the right brachial veins.  Left: No evidence of thrombosis in the subclavian.  *See table(s) above for measurements and observations.  Diagnosing physician: Curt Jews MD Electronically signed by Curt Jews MD on 10/22/2019 at 7:49:40 AM.    Final     Microbiology: Recent Results (from the past 240 hour(s))  Culture, blood (routine x 2)     Status: Abnormal   Collection Time: 10/22/19  5:03 PM   Specimen: BLOOD LEFT HAND  Result Value Ref Range Status   Specimen Description BLOOD LEFT HAND  Final   Special Requests   Final    BOTTLES DRAWN AEROBIC AND ANAEROBIC Blood Culture adequate volume   Culture  Setup Time   Final    AEROBIC BOTTLE ONLY GRAM NEGATIVE RODS CRITICAL VALUE NOTED.  VALUE IS CONSISTENT WITH PREVIOUSLY REPORTED AND CALLED VALUE.     Culture (A)  Final    PSEUDOMONAS AERUGINOSA SUSCEPTIBILITIES PERFORMED ON PREVIOUS CULTURE WITHIN THE LAST 5 DAYS. Performed at [facility], [place] [address], [place], [place] [address]    Report Status 10/25/2019 FINAL  Final  Culture, blood (routine x 2)     Status: Abnormal   Collection Time: 10/22/19  5:08 PM   Specimen: BLOOD LEFT ARM  Result Value Ref Range Status   Specimen Description BLOOD LEFT ARM  Final   Special Requests   Final    BOTTLES DRAWN AEROBIC ONLY Blood Culture results may not be optimal due to an inadequate volume of blood received in culture bottles   Culture  Setup Time   Final    AEROBIC BOTTLE ONLY GRAM NEGATIVE RODS CRITICAL VALUE NOTED.  VALUE IS CONSISTENT WITH PREVIOUSLY REPORTED AND CALLED VALUE.    Culture (A)  Final    PSEUDOMONAS AERUGINOSA SUSCEPTIBILITIES PERFORMED ON PREVIOUS CULTURE WITHIN THE LAST 5 DAYS. Performed at [facility], [place] [address] [place], [place] [address]    Report Status 10/25/2019 FINAL  Final  Culture, blood (routine x 2)     Status: None   Collection Time: 10/24/19  1:18 PM   Specimen: BLOOD LEFT HAND  Result Value Ref Range Status   Specimen Description BLOOD LEFT HAND  Final   Special Requests   Final    BOTTLES DRAWN AEROBIC ONLY Blood Culture adequate volume   Culture   Final    NO GROWTH 5 DAYS Performed at [facility], [place] [address] [place], [place] [address]    Report Status 10/29/2019  FINAL  Final  Culture, blood (routine x 2)     Status: None   Collection Time: 10/24/19  1:28 PM   Specimen: BLOOD RIGHT HAND  Result Value Ref Range Status   Specimen Description BLOOD RIGHT HAND  Final   Special Requests   Final    BOTTLES DRAWN AEROBIC AND ANAEROBIC Blood Culture results may not be optimal due to an excessive volume of blood received in culture bottles   Culture   Final    NO GROWTH 5 DAYS Performed at [facility], [place] [address], [place], [place] [address]    Report  Status 10/29/2019 FINAL  Final  Culture, blood (routine x 2)     Status: None (Preliminary result)   Collection Time: 10/29/19  9:25 PM   Specimen: BLOOD  Result Value Ref Range Status   Specimen Description BLOOD LEFT HAND  Final   Special Requests   Final    BOTTLES DRAWN AEROBIC ONLY Blood Culture results may not be optimal due to an inadequate volume of blood received in culture bottles   Culture   Final    NO GROWTH 3 DAYS Performed at [facility], [place] [address] [place], [place] [address]    Report Status PENDING  Incomplete  Culture, blood (routine x 2)     Status: None (Preliminary result)   Collection Time: 10/29/19  9:27 PM   Specimen: BLOOD  Result Value Ref Range Status   Specimen Description BLOOD RIGHT HAND  Final   Special Requests   Final    BOTTLES DRAWN AEROBIC ONLY Blood Culture adequate volume   Culture   Final    NO GROWTH 3 DAYS Performed at [facility], [place] [address], [place], [place] [address]    Report Status PENDING  Incomplete     Labs: Basic Metabolic Panel: Recent Labs  Lab 10/27/19 0255 10/28/19 0314 10/29/19 0319 10/30/19 0438 10/31/19 0358 11/01/19 0421  NA 134* 136 138 135 133* 136  K 3.6 4.1 4.2 4.5 3.8 4.7  CL 95* 98 100 97* 95* 96*  CO2 '24 25 27 25 26 27  ' GLUCOSE 128* 146* 108* 175* 166* 158*  BUN 20 38* 12 32* 13 31*  CREATININE 4.76* 6.70* 3.67* 5.81* 3.41* 5.59*  CALCIUM 8.0* 8.4* 8.6* 8.5* 8.4* 8.9  PHOS 3.6 5.0* 3.6 4.7* 3.3  --    Liver Function Tests: Recent Labs  Lab 10/27/19 0255 10/28/19 0314 10/29/19 0319 10/30/19 0438 10/31/19 0358  ALBUMIN 1.9* 1.7* 1.7* 1.7* 1.6*   No results for input(s): LIPASE, AMYLASE in the last 168 hours. No results for input(s): AMMONIA in the last 168 hours. CBC: Recent Labs  Lab 10/28/19 0314 10/29/19 0319 10/30/19 0438 10/31/19 0358 11/01/19 0421  WBC 12.5* 14.0* 13.2* 10.9* 8.7  HGB 7.0* 6.7* 8.3* 8.1* 7.7*  HCT 21.9* 21.6* 26.2* 25.6* 24.6*  MCV 89.4  92.7 91.3 91.8 91.4  PLT 201 201 205 197 206   Cardiac Enzymes: No results for input(s): CKTOTAL, CKMB, CKMBINDEX, TROPONINI in the last 168 hours. BNP: BNP (last 3 results) No results for input(s): BNP in the last 8760 hours.  ProBNP (last 3 results) No results for input(s): PROBNP in the last 8760 hours.  CBG: Recent Labs  Lab 10/31/19 1153 10/31/19 1557 10/31/19 2123 11/01/19 0610 11/01/19 1305  GLUCAP 189* 182* 170* 153* 126*    Active Problems:   ESRD needing dialysis (HCC)   Anemia of chronic renal failure  Type 2 diabetes mellitus with diabetic nephropathy (HCC)   Bacteremia due to Pseudomonas   Acute ischemic stroke ([place])   Acute left-sided weakness   Acute bacterial endocarditis   Time coordinating discharge: 38 minutes.  Signed:        Estes Lehner, DO [facility]  11/01/2019, 1:12 PM

## 2019-11-02 ENCOUNTER — Inpatient Hospital Stay (HOSPITAL_COMMUNITY): Payer: Medicare PPO | Admitting: Occupational Therapy

## 2019-11-02 ENCOUNTER — Inpatient Hospital Stay (HOSPITAL_COMMUNITY): Payer: Medicare PPO

## 2019-11-02 DIAGNOSIS — I639 Cerebral infarction, unspecified: Secondary | ICD-10-CM

## 2019-11-02 LAB — CBC
HCT: 28.3 % — ABNORMAL LOW (ref 36.0–46.0)
Hemoglobin: 8.8 g/dL — ABNORMAL LOW (ref 12.0–15.0)
MCH: 28.9 pg (ref 26.0–34.0)
MCHC: 31.1 g/dL (ref 30.0–36.0)
MCV: 93.1 fL (ref 80.0–100.0)
Platelets: 197 10*3/uL (ref 150–400)
RBC: 3.04 MIL/uL — ABNORMAL LOW (ref 3.87–5.11)
RDW: 15.8 % — ABNORMAL HIGH (ref 11.5–15.5)
WBC: 9.2 10*3/uL (ref 4.0–10.5)
nRBC: 0 % (ref 0.0–0.2)

## 2019-11-02 LAB — GLUCOSE, CAPILLARY
Glucose-Capillary: 145 mg/dL — ABNORMAL HIGH (ref 70–99)
Glucose-Capillary: 179 mg/dL — ABNORMAL HIGH (ref 70–99)
Glucose-Capillary: 308 mg/dL — ABNORMAL HIGH (ref 70–99)
Glucose-Capillary: 146 mg/dL — ABNORMAL HIGH (ref 70–99)

## 2019-11-02 LAB — PROTIME-INR
INR: 2.1 — ABNORMAL HIGH (ref 0.8–1.2)
Prothrombin Time: 23.5 seconds — ABNORMAL HIGH (ref 11.4–15.2)

## 2019-11-02 MED ORDER — SENNOSIDES-DOCUSATE SODIUM 8.6-50 MG PO TABS
1.0000 | ORAL_TABLET | Freq: Every day | ORAL | Status: DC
  Administered 2019-11-03 – 2019-11-08 (×6): 1 via ORAL
  Filled 2019-11-02 (×7): qty 1

## 2019-11-02 MED ORDER — WARFARIN SODIUM 4 MG PO TABS
4.0000 mg | ORAL_TABLET | Freq: Once | ORAL | Status: AC
  Administered 2019-11-02: 4 mg via ORAL
  Filled 2019-11-02: qty 1

## 2019-11-02 MED ORDER — CHLORHEXIDINE GLUCONATE CLOTH 2 % EX PADS
6.0000 | MEDICATED_PAD | Freq: Every day | CUTANEOUS | Status: DC
  Administered 2019-11-02 – 2019-11-05 (×4): 6 via TOPICAL

## 2019-11-02 NOTE — Progress Notes (Signed)
Social Work Assessment and Plan   Patient Details  Name: [name] MRN: [id number] Date of Birth: 04/29/1951  Today's Date: 11/02/2019  Problem List:  Patient Active Problem List   Diagnosis Date Noted  . Cerebrovascular accident (CVA) of right basal ganglia ([place]) 11/01/2019  . Acute bacterial endocarditis   . Acute left-sided weakness   . Acute ischemic stroke ([place]) 10/20/2019  . Hyperkalemia 09/10/2019  . End-stage renal disease on hemodialysis ([place])   . Nausea vomiting and diarrhea   . Bacteremia due to Pseudomonas 07/20/2019  . ESRD needing dialysis ([place]) 05/15/2019  . Anemia of chronic renal failure 04/03/2018  . Essential hypertension 04/03/2018  . Type 2 diabetes mellitus with diabetic nephropathy ([place]) 04/03/2018   Past Medical History:  Past Medical History:  Diagnosis Date  . Arthritis    hands  . Constipation   . Diabetes mellitus without complication (HCC)    Type II  . ESRD (end stage renal disease) ([place])     M/W/F- Hemodialysis  . GERD (gastroesophageal reflux disease)   . Headache   . Hyperlipidemia   . Hypertension   . Irregular heart rate   . Stroke [facility])    TIA - approx 2010- no residual   Past Surgical History:  Past Surgical History:  Procedure Laterality Date  . ABDOMINAL HYSTERECTOMY    . AORTIC ARCH ANGIOGRAPHY N/A 03/28/2019   Procedure: AORTIC ARCH ANGIOGRAPHY;  Surgeon: Waynetta Sandy, MD;  Location: [facility];  Service: Cardiovascular;  Laterality: N/A;  . AV FISTULA PLACEMENT Left 06/13/2018   Procedure: ARTERIOVENOUS (AV) FISTULA CREATION;  Surgeon: Rosetta Posner, MD;  Location: [facility];  Service: Vascular;  Laterality: Left;  . AV FISTULA PLACEMENT Left 08/18/2018   Procedure: INSERTION OF 4-7MM X 45CM ARTERIOVENOUS (AV) GORE-TEX GRAFT LEFT  FOREARM;  Surgeon: Rosetta Posner, MD;  Location: [place];  Service: Vascular;  Laterality: Left;  . AV FISTULA PLACEMENT Right 04/06/2019   Procedure: INSERTION OF ARTERIOVENOUS  (AV) GORE-TEX GRAFT RIGHT UPPER ARM;  Surgeon: Waynetta Sandy, MD;  Location: [place];  Service: Vascular;  Laterality: Right;  . AV FISTULA PLACEMENT Right 04/13/2019   Procedure: INSERTION OF ARTERIOVENOUS (AV) BOVINE  ARTEGRAFT GRAFT RIGHT UPPER EXTREMITY;  Surgeon: Serafina Mitchell, MD;  Location: [place];  Service: Vascular;  Laterality: Right;  . [place] REMOVAL Right 05/16/2019   Procedure: REMOVAL OF ARTERIOVENOUS GORETEX GRAFT ([place]) RIGHT ARM;  Surgeon: Angelia Mould, MD;  Location: [place];  Service: Vascular;  Laterality: Right;  . BASCILIC VEIN TRANSPOSITION Right 03/07/2019   Procedure: First Stage Bascilic Vein Transposition Right Arm;  Surgeon: Serafina Mitchell, MD;  Location: [place];  Service: Vascular;  Laterality: Right;  . CATARACT EXTRACTION Right 2005  . INSERTION OF DIALYSIS CATHETER N/A 08/18/2018   Procedure: INSERTION OF DIALYSIS CATHETER;  Surgeon: Rosetta Posner, MD;  Location: [place];  Service: Vascular;  Laterality: N/A;  . INSERTION OF DIALYSIS CATHETER Right 07/25/2019   Procedure: INSERTION OF DIALYSIS CATHETER RIGHT INTERNAL JUGULAR (TUNNELED);  Surgeon: Virl Cagey, MD;  Location: [facility];  Service: General;  Laterality: Right;  . IR FLUORO GUIDE CV LINE LEFT  10/26/2019  . IR FLUORO GUIDE CV LINE RIGHT  12/06/2018  . IR PTA ADDL CENTRAL DIALYSIS SEG THRU DIALY CIRCUIT RIGHT Right 12/06/2018  . IR REMOVAL TUN CV CATH W/O FL  10/23/2019  . IR US GUIDE VASC ACCESS LEFT  10/26/2019  . TEE WITHOUT CARDIOVERSION  N/A 10/26/2019   Procedure: TRANSESOPHAGEAL ECHOCARDIOGRAM (TEE);  Surgeon: Lelon Perla, MD;  Location: [place];  Service: Cardiovascular;  Laterality: N/A;  . THROMBECTOMY AND REVISION OF ARTERIOVENTOUS (AV) GORETEX  GRAFT Left 09/29/2018   Procedure: INSERTION OF ARTERIOVENTOUS (AV) GORETEX  GRAFT ARM;  Surgeon: Waynetta Sandy, MD;  Location: [place];  Service: Vascular;  Laterality: Left;  . UPPER EXTREMITY ANGIOGRAPHY Right 03/28/2019    Procedure: UPPER EXTREMITY ANGIOGRAPHY;  Surgeon: Waynetta Sandy, MD;  Location: [facility];  Service: Cardiovascular;  Laterality: Right;  . VENOGRAM  10/27/2018   Procedure: Venogram;  Surgeon: Marty Heck, MD;  Location: [facility];  Service: Cardiovascular;;  bilateral arm   Social History:  reports that she has never smoked. She has never used smokeless tobacco. She reports that she does not drink alcohol or use drugs.  Family / Support Systems Marital Status: Widow/Widower Patient Roles: Parent, Other (Comment)(sibling) Children: [name] Clark-son [phone] Other Supports: Jennifer-sister [id number]  [place] [phone] Anticipated Caregiver: Son and sister Ability/Limitations of Caregiver: Can provide assist son working from home and sister was coming to stay the night every night prior to admission-didn't want to be alone Caregiver Availability: 24/7 Family Dynamics: Close knit with son and sister's, she voiced they all help each other and will do whatever is needed for each other. Son working from home due to [employer] and can provide assist, along with her sister.  Social History Preferred language: English Religion: Holiness Cultural Background: No issues Education: HS Read: Yes Write: Yes Employment Status: Retired Public relations account executive Issues: No issues Guardian/Conservator: none-according to MD pt is capable of making her own decisions while here. Son is here daily and wants to be included in any decisions while here   Abuse/Neglect Abuse/Neglect Assessment Can Be Completed: Yes Physical Abuse: Denies Verbal Abuse: Denies Sexual Abuse: Denies Exploitation of patient/patient's resources: Denies Self-Neglect: Denies  Emotional Status Pt's affect, behavior and adjustment status: Pt is motivated but having much back pain after her stroke. She feels if this was lessen she could do more for herself, but will do  her best. She has always been very independent and driven herself to HD. She knows she can't do that now but wants to be mobile and taking care of herself. Recent Psychosocial Issues: other health issues HD, back issues, endocarditis, etc Psychiatric History: No history deferred depression screen due to adjusting to the new unit and trying to get used to the therapies. She has been through a lot and may benefit from seeing neuro-psych while here, will await therapy evaluations. Substance Abuse History: No issues  Patient / Family Perceptions, Expectations & Goals Pt/Family understanding of illness & functional limitations: Pt has a good understanding of her illness and CVA's and talks with the MD daily. She is not one to shy away from questions and wanting the truthful answer. Her son is here daily and provides support and also talks with the MD rounding. Premorbid pt/family roles/activities: Mom, sister, retiree, HD pt, church member, etc Anticipated changes in roles/activities/participation: resume Pt/family expectations/goals: Pt states: " I want to get back to where I was before this and do for myself."  Son states: " I will do what she needs done just like she would for me, but I know how independent she is."  [employer]: Other (Comment)([facility] in [place] M-W-F) Premorbid Home Care/DME Agencies: Other (Comment)(used a rw prior to admission) Transportation available at discharge: Pt drove self  to HD but will need son or sister to drive her now Resource referrals recommended: Neuropsychology  Discharge Planning Living Arrangements: Children Support Systems: Children, Other relatives, Friends/neighbors, Church/faith community Type of Residence: Private residence Insurance Resources: Multimedia programmer (specify)([employer] Medicare) Financial Resources: Family Support, Social Security Financial Screen Referred: No Living Expenses: Own Money Management: Patient,  Family Does the patient have any problems obtaining your medications?: No Home Management: Son and pt Patient/Family Preliminary Plans: Return home with son and sister to assist if needed. Son lives with pt and is currently working from home due to [employer]. He is here in the hospital daily providing support to Mom. Pt's sister-jennifer was styaing the night with her at home prior to admission due to did not want to be alone since their aunt died. Will await therapy evaluations and work on discharge needs. Social Work Anticipated Follow Up Needs: HH/OP  Clinical Impression Pleasant female who is motivated to do well and get back to her independent level before leaving here. Her son and sister can assist at discharge if needed, one of them will be transporting pt to HD since she will not be able to drive when she leaves here. May benefit from seeing neuro-psych while here will get input from team. MD is aware of pt's back pain and is addressing.  Elease Hashimoto 11/02/2019, 10:34 AM

## 2019-11-02 NOTE — Progress Notes (Signed)
Pharmacy Antibiotic Note  [name] is a 68 y.o. female admitted on 11/01/2019 with Pseudomonas bacteremia and MV IE.  Pharmacy has been consulted for Cefepime dosing.  The patient is ESRD-MWF. Noted ID plans for dual Cefepime + Cipro thru 12/06/19. Will keep Cefepime daily dosing with in CIR and plan to switch to dosing with HD upon discharge.   Plan: - Continue Cefepime 1g IV every 24 hours - Continue Cipro 500 mg po every 24 hours - Antibiotics thru 12/06/19 per ID - Will continue to follow HD schedule/duration  Height: 5\' 7"  (170.2 cm) Weight: 141 lb 8.6 oz (64.2 kg) IBW/kg (Calculated) : 61.6  Temp (24hrs), Avg:99.2 F (37.3 C), Min:98.9 F (37.2 C), Max:99.3 F (37.4 C)  Recent Labs  Lab 10/28/19 0314 10/29/19 0319 10/30/19 0438 10/31/19 0358 11/01/19 0421 11/02/19 0712  WBC 12.5* 14.0* 13.2* 10.9* 8.7 9.2  CREATININE 6.70* 3.67* 5.81* 3.41* 5.59*  --     Estimated Creatinine Clearance: 9.2 mL/min (A) (by C-G formula based on SCr of 5.59 mg/dL (H)).    No Known Allergies  Antimicrobials this admission: Cefepime 11/30 >> Cipro 12/3 >>  Microbiology results: 11/27 COVID >> neg 11/29 BCx >> pseudomonas 4/8 (pan sens) 12/1 Bcx>>ngtd Thank you for allowing pharmacy to be a part of this patient's care.  Alycia Rossetti, PharmD, BCPS Clinical Pharmacist Clinical phone for 11/02/2019: [phone] 11/02/2019 2:22 PM   **Pharmacist phone directory can now be found on [email or web address] ([email or web address]).  Listed under [place].

## 2019-11-02 NOTE — Progress Notes (Signed)
Patient ID: [name], female   DOB: 24-Sep-1951, 68 y.o.   MRN: [id number]   [facility] Progress Note   Assessment/ Plan:   1.  Recurrent Pseudomonas bacteremia (Likely HD CRB) with evidence of mitral valve endocarditis prompting exchange of hemodialysis catheter awaiting repeat blood cultures and recommendations by ID.  No surgical indications for repair/replacement at this time per CT surgery; medical management recommended with reevaluation post completion of Cefepime/ciprofloxacin. 2. ESRD: Continue HD on Monday/Wednesday/Friday dialysis schedule with next HD tomorrow. 3. Anemia: Without overt blood loss, redose ESA, no indications for PRBC transfusion.  4. CKD-MBD: Phosphorus level within acceptable range, monitor on renal diet/binders 5.  Acute CVA with evidence of chronic ischemia/old infarcts: Ongoing management per neurology, evidence of mitral valve endocarditis.  On intravenous cefepime and oral ciprofloxacin. 6. Hypertension: Improving blood pressure noted status post recent CVA.  Subjective:   No acute events overnight--transferred into CIR yesterday.    Objective:   BP 122/73   Pulse 86   Temp 98.9 F (37.2 C) (Oral)   Resp 17   Ht 5\' 7"  (1.702 m)   Wt 64.2 kg   SpO2 100%   BMI 22.17 kg/m   Physical Exam: Gen: Working with PT in room CVS: Regular rhythm, normal rate, S1 and S2 normal Resp: Clear to auscultation bilaterally, no audible rales or rhonchi Abd: Soft, flat, nontender Ext: No lower extremity edema.  Right IJ [facility]  Labs: BMET Recent Labs  Lab 10/27/19 0255 10/28/19 0314 10/29/19 0319 10/30/19 0438 10/31/19 0358 11/01/19 0421  NA 134* 136 138 135 133* 136  K 3.6 4.1 4.2 4.5 3.8 4.7  CL 95* 98 100 97* 95* 96*  CO2 24 25 27 25 26 27   GLUCOSE 128* 146* 108* 175* 166* 158*  BUN 20 38* 12 32* 13 31*  CREATININE 4.76* 6.70* 3.67* 5.81* 3.41* 5.59*  CALCIUM 8.0* 8.4* 8.6* 8.5* 8.4* 8.9  PHOS 3.6 5.0* 3.6 4.7* 3.3  --     CBC Recent Labs  Lab 10/30/19 0438 10/31/19 0358 11/01/19 0421 11/02/19 0712  WBC 13.2* 10.9* 8.7 9.2  HGB 8.3* 8.1* 7.7* 8.8*  HCT 26.2* 25.6* 24.6* 28.3*  MCV 91.3 91.8 91.4 93.1  PLT 205 197 206 197    Medications:    . calcium carbonate  1 tablet Oral 2 times per day  . carvedilol  6.25 mg Oral BID  . Chlorhexidine Gluconate Cloth  6 each Topical Daily  . ciprofloxacin  500 mg Oral q1800  . [START ON 11/03/2019] doxercalciferol  1.5 mcg Intravenous Q M,W,F-HD  . feeding supplement (ENSURE ENLIVE)  237 mL Oral BID BM  . gabapentin  100 mg Oral QHS  . insulin aspart  0-9 Units Subcutaneous TID WC  . isosorbide mononitrate  30 mg Oral QPC lunch  . multivitamin  1 tablet Oral QPC lunch  . Warfarin - Pharmacist Dosing Inpatient   Does not apply [id number]   Elmarie Shiley, MD 11/02/2019, 9:26 AM

## 2019-11-02 NOTE — Progress Notes (Signed)
Pt c/o of increased lower back pain. Requesting heating pad and medication. Pt was given 5 mg Oxy, pt has rested through out the night. No signs of distress.  Call light in reach,

## 2019-11-02 NOTE — Progress Notes (Signed)
Currently infectious diseases is recommending that the patient continue on daily cefepime and oral Cipro while in rehab.  Following rehab discharge they recommended the patient continue oral Cipro 500 mg daily through 12/06/2019 and stop with change in dosing of cefepime to 2 g intravenously after hemodialysis on Mondays and Wednesdays and 3 g intravenously after hemodialysis on Fridays through 12/06/2019 and stop for mitral valve endocarditis

## 2019-11-02 NOTE — Evaluation (Signed)
Occupational Therapy Assessment and Plan  Patient Details  Name: [name] MRN: [id number] Date of Birth: 1951-03-29  OT Diagnosis: abnormal posture, acute pain, hemiplegia affecting non-dominant side, muscle weakness (generalized) and coordination disorder Rehab Potential: Rehab Potential (ACUTE ONLY): Good ELOS: 2- 2.5 wks   Today's Date: 11/02/2019 OT Individual Time: 7829-5621 OT Individual Time Calculation (min): 55 min     Problem List:  Patient Active Problem List   Diagnosis Date Noted  . Cerebrovascular accident (CVA) of right basal ganglia ([place]) 11/01/2019  . Acute bacterial endocarditis   . Acute left-sided weakness   . Acute ischemic stroke ([place]) 10/20/2019  . Hyperkalemia 09/10/2019  . End-stage renal disease on hemodialysis ([place])   . Nausea vomiting and diarrhea   . Bacteremia due to Pseudomonas 07/20/2019  . ESRD needing dialysis ([place]) 05/15/2019  . Anemia of chronic renal failure 04/03/2018  . Essential hypertension 04/03/2018  . Type 2 diabetes mellitus with diabetic nephropathy ([place]) 04/03/2018    Past Medical History:  Past Medical History:  Diagnosis Date  . Arthritis    hands  . Constipation   . Diabetes mellitus without complication (HCC)    Type II  . ESRD (end stage renal disease) ([place])     M/W/F- Hemodialysis  . GERD (gastroesophageal reflux disease)   . Headache   . Hyperlipidemia   . Hypertension   . Irregular heart rate   . Stroke [facility])    TIA - approx 2010- no residual   Past Surgical History:  Past Surgical History:  Procedure Laterality Date  . ABDOMINAL HYSTERECTOMY    . AORTIC ARCH ANGIOGRAPHY N/A 03/28/2019   Procedure: AORTIC ARCH ANGIOGRAPHY;  Surgeon: Waynetta Sandy, MD;  Location: [place] [facility];  Service: Cardiovascular;  Laterality: N/A;  . AV FISTULA PLACEMENT Left 06/13/2018   Procedure: ARTERIOVENOUS (AV) FISTULA CREATION;  Surgeon: Rosetta Posner, MD;  Location: [place];  Service: Vascular;   Laterality: Left;  . AV FISTULA PLACEMENT Left 08/18/2018   Procedure: INSERTION OF 4-7MM X 45CM ARTERIOVENOUS (AV) GORE-TEX GRAFT LEFT  FOREARM;  Surgeon: Rosetta Posner, MD;  Location: [place];  Service: Vascular;  Laterality: Left;  . AV FISTULA PLACEMENT Right 04/06/2019   Procedure: INSERTION OF ARTERIOVENOUS (AV) GORE-TEX GRAFT RIGHT UPPER ARM;  Surgeon: Waynetta Sandy, MD;  Location: [place];  Service: Vascular;  Laterality: Right;  . AV FISTULA PLACEMENT Right 04/13/2019   Procedure: INSERTION OF ARTERIOVENOUS (AV) BOVINE  ARTEGRAFT GRAFT RIGHT UPPER EXTREMITY;  Surgeon: Serafina Mitchell, MD;  Location: [place];  Service: Vascular;  Laterality: Right;  . [place] REMOVAL Right 05/16/2019   Procedure: REMOVAL OF ARTERIOVENOUS GORETEX GRAFT ([place]) RIGHT ARM;  Surgeon: Angelia Mould, MD;  Location: [place];  Service: Vascular;  Laterality: Right;  . BASCILIC VEIN TRANSPOSITION Right 03/07/2019   Procedure: First Stage Bascilic Vein Transposition Right Arm;  Surgeon: Serafina Mitchell, MD;  Location: [place];  Service: Vascular;  Laterality: Right;  . CATARACT EXTRACTION Right 2005  . INSERTION OF DIALYSIS CATHETER N/A 08/18/2018   Procedure: INSERTION OF DIALYSIS CATHETER;  Surgeon: Rosetta Posner, MD;  Location: [place];  Service: Vascular;  Laterality: N/A;  . INSERTION OF DIALYSIS CATHETER Right 07/25/2019   Procedure: INSERTION OF DIALYSIS CATHETER RIGHT INTERNAL JUGULAR (TUNNELED);  Surgeon: Virl Cagey, MD;  Location: [facility];  Service: General;  Laterality: Right;  . IR FLUORO GUIDE CV LINE LEFT  10/26/2019  . IR FLUORO GUIDE CV LINE  RIGHT  12/06/2018  . IR PTA ADDL CENTRAL DIALYSIS SEG THRU DIALY CIRCUIT RIGHT Right 12/06/2018  . IR REMOVAL TUN CV CATH W/O FL  10/23/2019  . IR US GUIDE VASC ACCESS LEFT  10/26/2019  . TEE WITHOUT CARDIOVERSION N/A 10/26/2019   Procedure: TRANSESOPHAGEAL ECHOCARDIOGRAM (TEE);  Surgeon: Lelon Perla, MD;  Location: [place];  Service:  Cardiovascular;  Laterality: N/A;  . THROMBECTOMY AND REVISION OF ARTERIOVENTOUS (AV) GORETEX  GRAFT Left 09/29/2018   Procedure: INSERTION OF ARTERIOVENTOUS (AV) GORETEX  GRAFT ARM;  Surgeon: Waynetta Sandy, MD;  Location: [place];  Service: Vascular;  Laterality: Left;  . UPPER EXTREMITY ANGIOGRAPHY Right 03/28/2019   Procedure: UPPER EXTREMITY ANGIOGRAPHY;  Surgeon: Waynetta Sandy, MD;  Location: [place] [facility];  Service: Cardiovascular;  Laterality: Right;  . VENOGRAM  10/27/2018   Procedure: Venogram;  Surgeon: Marty Heck, MD;  Location: [place] [facility];  Service: Cardiovascular;;  bilateral arm    Assessment & Plan Clinical Impression: Patient is a 68 y.o. year old female with history of end-stage renal disease with hemodialysis Monday Wednesday Fridays, diet controlled diabetes mellitus, pseudomonal bacteremia and early discitis August 2020 completing a 6-week course of Tressie Ellis as well as history of infected AV graft, TIA 2010 maintained on aspirin and Plavix, hypertension, hyperlipidemia.  Per chart review she lives with her brother.  Independent prior to admission.  Presented 10/20/2019 with left-sided weakness and difficulty with ambulation.  Her last dialysis was 10/18/2019.  CT of the brain showed acute right basal ganglia infarction.  Patient did not receive TPA.  Admission chemistries with creatinine 7.76, BUN 56, WBC 15,700, hemoglobin 9.7, SARS coronavirus negative.  MRI/MRA showed acute right basal ganglia infarction chronic ischemia with multiple old infarctions.  Negative head MRA.  Carotid Dopplers unremarkable.  Echocardiogram with ejection fraction of 65% without emboli or PFO.  Hospital course complicated by low-grade fever of 101 with findings of right IJ DVT on Dopplers.  Patient was placed on intravenous heparin and placed on Coumadin that she would remain on for approximately 3 months.  Infectious disease work-up during fever revealed Pseudomonas  by BCID.  TEE performed on 10/26/2019 demonstrates large vegetation of mitral valve.  CTS consulted Dr. Darcey Nora felt the best course of action was continued IV antibiotics no current plan for mitral valve repair or replacement due to risk of recurrent infection at this time and current recommendations are to continue antibiotics 6 weeks with cefepime as well as Cipro with end date to be established.  Latest follow-up blood culture showed no growth.  Hemodialysis remains ongoing as per renal services.  Right IJ hemodialysis catheter was removed 10/23/2019 with placement of new tunneled catheter 10/26/2019 for continued hemodialysis per renal services.  Noted on 10/27/2019 patient with altered mental status follow-up cranial CT scan completed that showed evidence of a new small frontal lobe white matter infarction new since MRI of 10/20/2019 near the left frontal horn.  No associated hemorrhage or mass-effect with follow-up neurology services recommending continued plan of care with ongoing Coumadin.  Patient did have some nonspecific back pain MRI of the left hip as well as lumbar spine no findings suspicious for osteomyelitis or septic arthritis.  There was some edema of the paraspinal and psoas muscles as well as osteodystrophy.  She was fitted with a TLSO back brace only for comfort.  .  Patient transferred to CIR on 11/01/2019 .    Patient currently requires max- total  with basic  self-care skills secondary to muscle weakness, decreased cardiorespiratoy endurance and decreased sitting balance, decreased standing balance and decreased balance strategies.  Prior to hospitalization, patient could complete ADLs and IADLs with modified independent .  Patient will benefit from skilled intervention to decrease level of assist with basic self-care skills prior to discharge home with care partner.  Anticipate patient will require 24 hour supervision and follow up home health.  OT - End of Session Activity Tolerance:  Decreased this session Endurance Deficit: Yes Endurance Deficit Description: fatigues easily and with increased pain OT Assessment Rehab Potential (ACUTE ONLY): Good OT Barriers to Discharge: Other (comments) OT Barriers to Discharge Comments: none known at this time OT Patient demonstrates impairments in the following area(s): Balance;Endurance;Pain;Safety OT Basic ADL's Functional Problem(s): Bathing;Dressing;Toileting;Grooming OT Transfers Functional Problem(s): Toilet;Tub/Shower OT Additional Impairment(s): Fuctional Use of Upper Extremity OT Plan OT Intensity: Minimum of 1-2 x/day, 45 to 90 minutes OT Frequency: 5 out of 7 days OT Duration/Estimated Length of Stay: 2- 2.5 wks OT Treatment/Interventions: Balance/vestibular training;Self Care/advanced ADL retraining;UE/LE Coordination activities;Functional mobility training;Neuromuscular re-education;Community reintegration;Wheelchair propulsion/positioning;Discharge planning;Pain management;Therapeutic Activities;Patient/family education;Therapeutic Exercise;DME/adaptive equipment instruction;UE/LE Strength taining/ROM OT Self Feeding Anticipated Outcome(s): n/a OT Basic Self-Care Anticipated Outcome(s): S OT Toileting Anticipated Outcome(s): S OT Bathroom Transfers Anticipated Outcome(s): S OT Recommendation Recommendations for Other Services: Neuropsych consult;Therapeutic Recreation consult Therapeutic Recreation Interventions: Other (comment)(leisure tasks) Patient destination: Home Follow Up Recommendations: Home health OT;24 hour supervision/assistance Equipment Recommended: To be determined   Skilled Therapeutic Intervention Upon entering the room, pt supine in bed and awaiting OT arrival. Pt was agreeable to OT intervention and requesting to shower. OT educated pt on OT purpose, POC, and goals with pt verbalizing understanding and agreement. Pt performed supine >sit with mod A to EOB. Stand pivot transfer into wheelchair with  max A. OT assisted pt into bathroom and transfer onto shower chair with max squat pivot transfer. OT covering PICC and cath and pt began vomiting multiple times with reports of increased back pain. RN notified. Once pt stopped vomiting OT was able to assist pt back into wheelchair and then back to bed with pt needing max A squat pivot transfer and total A to don clean hospital gown.  OT assisted pt with repositioning and calling RN for pain medication. Call bell and all needed items within reach.   OT Evaluation Precautions/Restrictions  Precautions Precautions: Fall Required Braces or Orthoses: Spinal Brace Spinal Brace: Thoracolumbosacral orthotic;Other (comment) Spinal Brace Comments: for comfort with mobility. Restrictions Weight Bearing Restrictions: No Vital Signs Therapy Vitals Temp: 98.9 F (37.2 C) Temp Source: Oral Pulse Rate: 82 Resp: 17 BP: 140/74 Patient Position (if appropriate): Lying Oxygen Therapy SpO2: 100 % O2 Device: Room Air Pain Pain Assessment Pain Scale: 0-10 Pain Score: 6  Pain Type: Chronic pain Pain Location: Back Pain Orientation: Lower Pain Descriptors / Indicators: Aching;Discomfort;Grimacing;Guarding Pain Onset: Gradual Patients Stated Pain Goal: 2 Pain Intervention(s): RN made aware;Repositioned Multiple Pain Sites: No Home Living/Prior Functioning Home Living Family/patient expects to be discharged to:: Private residence Living Arrangements: Other relatives Available Help at Discharge: Family, Available 24 hours/day Type of Home: House Home Access: Stairs to enter Technical brewer of Steps: 3 Entrance Stairs-Rails: Right, Left, Can reach both Home Layout: One level Bathroom Shower/Tub: Multimedia programmer: Standard Bathroom Accessibility: Yes  Lives With: Son Prior Function Level of Independence: Requires assistive device for independence Comments: household and short distanced community ambulator,  drives Vision Baseline Vision/History: Wears glasses Wears Glasses: At all times Patient Visual  Report: No change from baseline;Other (comment) Additional Comments: pt reports  its difficult to see out of L eye at baseline Cognition Overall Cognitive Status: Within Functional Limits for tasks assessed Arousal/Alertness: Awake/alert Orientation Level: Person;Place;Situation Person: Oriented Place: Oriented Situation: Oriented Year: 2020 Month: December Day of Week: Correct Memory: Impaired Memory Impairment: Decreased short term memory Decreased Short Term Memory: Verbal basic;Functional basic Memory Recall Sock: Without Cue Memory Recall Blue: Without Cue Memory Recall Bed: Without Cue Safety/Judgment: Impaired Sensation Sensation Light Touch: Appears Intact Hot/Cold: Appears Intact Proprioception: Appears Intact Stereognosis: Not tested Coordination Gross Motor Movements are Fluid and Coordinated: No Fine Motor Movements are Fluid and Coordinated: No Finger Nose Finger Test: decreased speed and dexterity on LUE Motor  Motor Motor: Abnormal postural alignment and control;Hemiplegia Motor - Skilled Clinical Observations: mild L hemiparesis, L lateral lean, grossly uncoordinated due to bilateral LE weakness Mobility  Bed Mobility Bed Mobility: Rolling Right;Rolling Left;Supine to Sit;Sit to Supine Rolling Right: Moderate Assistance - Patient 50-74% Rolling Left: Moderate Assistance - Patient 50-74% Supine to Sit: Moderate Assistance - Patient 50-74% Sit to Supine: Moderate Assistance - Patient 50-74% Transfers Sit to Stand: Maximal Assistance - Patient 25-49% Stand to Sit: Maximal Assistance - Patient 25-49%  Trunk/Postural Assessment  Cervical Assessment Cervical Assessment: Exceptions to WFL(forward head) Thoracic Assessment Thoracic Assessment: Exceptions to WFL(kypthoic) Lumbar Assessment Lumbar Assessment: Exceptions to [facility] Postural Control Postural Control:  Deficits on evaluation  Balance Balance Balance Assessed: Yes Static Sitting Balance Static Sitting - Balance Support: Bilateral upper extremity supported Static Sitting - Level of Assistance: 3: Mod assist Dynamic Sitting Balance Dynamic Sitting - Balance Support: Bilateral upper extremity supported Dynamic Sitting - Level of Assistance: 3: Mod assist Static Standing Balance Static Standing - Balance Support: Bilateral upper extremity supported Static Standing - Level of Assistance: 2: Max assist;1: +1 Total assist Dynamic Standing Balance Dynamic Standing - Balance Support: Bilateral upper extremity supported Dynamic Standing - Level of Assistance: 1: +1 Total assist;2: Max assist Extremity/Trunk Assessment RUE Assessment RUE Assessment: Within Functional Limits LUE Assessment LUE Assessment: Exceptions to [facility] Passive Range of Motion (PROM) Comments: WFLs Active Range of Motion (AROM) Comments: WFLs General Strength Comments: 3-/5     Refer to Care Plan for Long Term Goals  Recommendations for other services: Neuropsych and Therapeutic Recreation  Other leisure interest   Discharge Criteria: Patient will be discharged from OT if patient refuses treatment 3 consecutive times without medical reason, if treatment goals not met, if there is a change in medical status, if patient makes no progress towards goals or if patient is discharged from hospital.  The above assessment, treatment plan, treatment alternatives and goals were discussed and mutually agreed upon: by patient  Gypsy Decant 11/02/2019, 7:58 AM

## 2019-11-02 NOTE — Progress Notes (Addendum)
[name] PHYSICAL MEDICINE & REHABILITATION PROGRESS NOTE   Subjective/Complaints: No complaints this morning. Denies pain. Slept well last night. Has not yet had BM.  Cr elevated this morning; appreciate nephrology follow-up   Objective:   No results found. Recent Labs    11/01/19 0421 11/02/19 0712  WBC 8.7 9.2  HGB 7.7* 8.8*  HCT 24.6* 28.3*  PLT 206 197   Recent Labs    10/31/19 0358 11/01/19 0421  NA 133* 136  K 3.8 4.7  CL 95* 96*  CO2 26 27  GLUCOSE 166* 158*  BUN 13 31*  CREATININE 3.41* 5.59*  CALCIUM 8.4* 8.9    Intake/Output Summary (Last 24 hours) at 11/02/2019 1141 Last data filed at 11/02/2019 0837 Gross per 24 hour  Intake 341.63 ml  Output -  Net 341.63 ml     Physical Exam: Vital Signs Blood pressure 122/73, pulse 86, temperature 98.9 F (37.2 C), temperature source Oral, resp. rate 17, height 5\' 7"  (1.702 m), weight 64.2 kg, SpO2 100 %.  Gen: no distress, normal appearing, lying in bed HEENT: oral mucosa pink and moist, NCAT Cardio: Reg rate Chest: normal effort, normal rate of breathing Abd: soft, non-distended Ext: no edema Skin: intact Neurological: 4+/5 strength throughout. Sensation intact.  Patient is sitting up in bed.  Easily arousable.  Makes good eye contact with examiner follows full commands.  She provides her name and age.  Fair insight to her deficits.  Psych: pleasant, normal affect   Assessment/Plan: 1. Functional deficits secondary to right basal ganglia stroke which require 3+ hours per day of interdisciplinary therapy in a comprehensive inpatient rehab setting.  Physiatrist is providing close team supervision and 24 hour management of active medical problems listed below.  Physiatrist and rehab team continue to assess barriers to discharge/monitor patient progress toward functional and medical goals  Care Tool:  Bathing              Bathing assist       Upper Body Dressing/Undressing Upper body  dressing        Upper body assist      Lower Body Dressing/Undressing Lower body dressing      What is the patient wearing?: Pants     Lower body assist Assist for lower body dressing: Moderate Assistance - Patient 50 - 74%     Toileting Toileting    Toileting assist       Transfers Chair/bed transfer  Transfers assist     Chair/bed transfer assist level: 2 Helpers(with RW)     Locomotion Ambulation   Ambulation assist   Ambulation activity did not occur: Safety/medical concerns(back pain, bilateral LE weakness, poor bilateral knee control)          Walk 10 feet activity   Assist  Walk 10 feet activity did not occur: Safety/medical concerns(back pain, bilateral LE weakness, poor bilateral knee control)        Walk 50 feet activity   Assist Walk 50 feet with 2 turns activity did not occur: Safety/medical concerns(back pain, bilateral LE weakness, poor bilateral knee control)         Walk 150 feet activity   Assist Walk 150 feet activity did not occur: Safety/medical concerns(back pain, bilateral LE weakness, poor bilateral knee control)         Walk 10 feet on uneven surface  activity   Assist Walk 10 feet on uneven surfaces activity did not occur: Safety/medical concerns(back pain, bilateral LE weakness, poor bilateral knee  control)         Wheelchair     Assist Will patient use wheelchair at discharge?: Yes Type of Wheelchair: Manual Wheelchair activity did not occur: Safety/medical concerns(Pt could not tolerate sitting in [facility] for long enough to complete activity due to low back pain)         Wheelchair 50 feet with 2 turns activity    Assist    Wheelchair 50 feet with 2 turns activity did not occur: Safety/medical concerns(Pt could not tolerate sitting in [facility] for long enough to complete activity due to low back pain)       Wheelchair 150 feet activity     Assist  Wheelchair 150 feet activity did not occur:  Safety/medical concerns(Pt could not tolerate sitting in [facility] for long enough to complete activity due to low back pain)       Blood pressure 122/73, pulse 86, temperature 98.9 F (37.2 C), temperature source Oral, resp. rate 17, height 5\' 7"  (1.702 m), weight 64.2 kg, SpO2 100 %.    Medical Problem List and Plan: 1.  Impaired gait with left side weakness secondary to right basal ganglia infarction as well as small cortical infarct left frontal lobe             -patient may shower             -ELOS/Goals: modI in PT, OT, I in SLP 2.  Antithrombotics: -DVT/anticoagulation: Chronic Coumadin for right IJ acute DVT.  Plan anticoagulation x3 months             -antiplatelet therapy: N/A 3. Pain Management chronic back and left hip pain: Neurontin 100 mg nightly, oxycodone as needed.  TLSO back brace for comfort  12/10: Pain well controlled 4. Mood: Provide emotional support             -antipsychotic agents: N/A 5. Neuropsych: This patient is capable of making decisions on her own behalf. 6. Skin/Wound Care: Routine skin checks 7. Fluids/Electrolytes/Nutrition: Routine in and outs with follow-up chemistries 8.  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  New HD tunneled catheter placed 10/26/2019. 9.  ID/recurrent Pseudomonas bacteremia.  Continue Maxipime and Cipro as directed with infectious disease to discuss duration of antibiotic therapies anticipate 6 weeks. 10.  Hemodialysis MWF.  Follow-up per renal services.   12/10: Cr elevated this morning; appreciate nephrology follow-up 11.  Diabetes mellitus.  Hemoglobin A1c 7.6.  SSI. 12.  Hypertension.  Imdur 30 mg daily, Coreg 6.25 mg twice daily.  Monitor with increased mobility.  12/10: BP stable 13.  Acute on chronic anemia.  Follow-up CBC  12/10: hgb improved 14. Constipation:  12/10: No BM yet. Will make prn senna-docusate scheduled.  15. Disposition: Upon  discharge, patient should have follow-up with PCP, physiatry, infectious disease, and neurology.    LOS: 1 days A FACE TO FACE EVALUATION WAS PERFORMED  [name] 11/02/2019, 11:41 AM

## 2019-11-02 NOTE — Progress Notes (Signed)
Inpatient Rehabilitation  Patient information reviewed and entered into eRehab system by Americo Vallery M. Shaquela Weichert, M.A., CCC/SLP, PPS Coordinator.  Information including medical coding, functional ability and quality indicators will be reviewed and updated through discharge.    

## 2019-11-02 NOTE — Evaluation (Signed)
Physical Therapy Assessment and Plan  Patient Details  Name: [name] MRN: [id number] Date of Birth: [birth year]  PT Diagnosis: Abnormal posture, Abnormality of gait, Coordination disorder, Difficulty walking, Hemiparesis non-dominant, Low back pain and Muscle weakness Rehab Potential: Good ELOS: 16-18 days   Today's Date: 11/02/2019 PT Individual Time: 4098-1191 PT Individual Time Calculation (min): 71 min    Problem List:  Patient Active Problem List   Diagnosis Date Noted  . Cerebrovascular accident (CVA) of right basal ganglia ([place]) 11/01/2019  . Acute bacterial endocarditis   . Acute left-sided weakness   . Acute ischemic stroke ([place]) 10/20/2019  . Hyperkalemia 09/10/2019  . End-stage renal disease on hemodialysis ([place])   . Nausea vomiting and diarrhea   . Bacteremia due to Pseudomonas 07/20/2019  . ESRD needing dialysis ([place]) 05/15/2019  . Anemia of chronic renal failure 04/03/2018  . Essential hypertension 04/03/2018  . Type 2 diabetes mellitus with diabetic nephropathy ([place]) 04/03/2018    Past Medical History:  Past Medical History:  Diagnosis Date  . Arthritis    hands  . Constipation   . Diabetes mellitus without complication (HCC)    Type II  . ESRD (end stage renal disease) ([place])     M/W/F- Hemodialysis  . GERD (gastroesophageal reflux disease)   . Headache   . Hyperlipidemia   . Hypertension   . Irregular heart rate   . Stroke [facility])    TIA - approx 2010- no residual   Past Surgical History:  Past Surgical History:  Procedure Laterality Date  . ABDOMINAL HYSTERECTOMY    . AORTIC ARCH ANGIOGRAPHY N/A 03/28/2019   Procedure: AORTIC ARCH ANGIOGRAPHY;  Surgeon: Waynetta Sandy, MD;  Location: [facility];  Service: Cardiovascular;  Laterality: N/A;  . AV FISTULA PLACEMENT Left 06/13/2018   Procedure: ARTERIOVENOUS (AV) FISTULA CREATION;  Surgeon: Rosetta Posner, MD;  Location: [place];  Service: Vascular;  Laterality: Left;  . AV  FISTULA PLACEMENT Left 08/18/2018   Procedure: INSERTION OF 4-7MM X 45CM ARTERIOVENOUS (AV) GORE-TEX GRAFT LEFT  FOREARM;  Surgeon: Rosetta Posner, MD;  Location: [place];  Service: Vascular;  Laterality: Left;  . AV FISTULA PLACEMENT Right 04/06/2019   Procedure: INSERTION OF ARTERIOVENOUS (AV) GORE-TEX GRAFT RIGHT UPPER ARM;  Surgeon: Waynetta Sandy, MD;  Location: [place];  Service: Vascular;  Laterality: Right;  . AV FISTULA PLACEMENT Right 04/13/2019   Procedure: INSERTION OF ARTERIOVENOUS (AV) BOVINE  ARTEGRAFT GRAFT RIGHT UPPER EXTREMITY;  Surgeon: Serafina Mitchell, MD;  Location: [place];  Service: Vascular;  Laterality: Right;  . [place] REMOVAL Right 05/16/2019   Procedure: REMOVAL OF ARTERIOVENOUS GORETEX GRAFT ([place]) RIGHT ARM;  Surgeon: Angelia Mould, MD;  Location: [place];  Service: Vascular;  Laterality: Right;  . BASCILIC VEIN TRANSPOSITION Right 03/07/2019   Procedure: First Stage Bascilic Vein Transposition Right Arm;  Surgeon: Serafina Mitchell, MD;  Location: [place];  Service: Vascular;  Laterality: Right;  . CATARACT EXTRACTION Right 2005  . INSERTION OF DIALYSIS CATHETER N/A 08/18/2018   Procedure: INSERTION OF DIALYSIS CATHETER;  Surgeon: Rosetta Posner, MD;  Location: [place];  Service: Vascular;  Laterality: N/A;  . INSERTION OF DIALYSIS CATHETER Right 07/25/2019   Procedure: INSERTION OF DIALYSIS CATHETER RIGHT INTERNAL JUGULAR (TUNNELED);  Surgeon: Virl Cagey, MD;  Location: [facility];  Service: General;  Laterality: Right;  . IR FLUORO GUIDE CV LINE LEFT  10/26/2019  . IR FLUORO GUIDE CV LINE RIGHT  12/06/2018  .  IR PTA ADDL CENTRAL DIALYSIS SEG THRU DIALY CIRCUIT RIGHT Right 12/06/2018  . IR REMOVAL TUN CV CATH W/O FL  10/23/2019  . IR US GUIDE VASC ACCESS LEFT  10/26/2019  . TEE WITHOUT CARDIOVERSION N/A 10/26/2019   Procedure: TRANSESOPHAGEAL ECHOCARDIOGRAM (TEE);  Surgeon: Lelon Perla, MD;  Location: [place];  Service: Cardiovascular;  Laterality: N/A;  .  THROMBECTOMY AND REVISION OF ARTERIOVENTOUS (AV) GORETEX  GRAFT Left 09/29/2018   Procedure: INSERTION OF ARTERIOVENTOUS (AV) GORETEX  GRAFT ARM;  Surgeon: Waynetta Sandy, MD;  Location: ;  Service: Vascular;  Laterality: Left;  . UPPER EXTREMITY ANGIOGRAPHY Right 03/28/2019   Procedure: UPPER EXTREMITY ANGIOGRAPHY;  Surgeon: Waynetta Sandy, MD;  Location: [place];  Service: Cardiovascular;  Laterality: Right;  . VENOGRAM  10/27/2018   Procedure: Venogram;  Surgeon: Marty Heck, MD;  Location: [facility];  Service: Cardiovascular;;  bilateral arm    Assessment & Plan Clinical Impression: Patient is a 68 y.o. year old female with history of end-stage renal disease with hemodialysis Monday Wednesday Fridays, diet controlled diabetes mellitus, pseudomonal bacteremia and early discitis August 2020 completing a 6-week course of Tressie Ellis as well as history of infected AV graft, TIA 2010 maintained on aspirin and Plavix, hypertension, hyperlipidemia. Per chart review she lives with her brother. Independent prior to admission. Presented 10/20/2019 with left-sided weakness and difficulty with ambulation. Her last dialysis was 10/18/2019. CT of the brain showed acute right basal ganglia infarction. Patient did not receive TPA. Admission chemistries with creatinine 7.76, BUN 56, WBC 15,700, hemoglobin 9.7, SARS coronavirus negative. MRI/MRA showed acute right basal ganglia infarction chronic ischemia with multiple old infarctions. Negative head MRA. Carotid Dopplers unremarkable. Echocardiogram with ejection fraction of 65% without emboli or PFO. Hospital course complicated by low-grade fever of 101 with findings of right IJ DVT on Dopplers. Patient was placed on intravenous heparin and placed on Coumadin that she would remain on for approximately 3 months. Infectious disease work-up during fever revealed Pseudomonas by BCID. TEE performed on 10/26/2019  demonstrates large vegetation of mitral valve. CTS consulted Dr. Darcey Nora felt the best course of action was continued IV antibiotics no current plan for mitral valve repair or replacement due to risk of recurrent infection at this time and current recommendations are to continue antibiotics 6 weeks with cefepime as well as Cipro with end date to be established. Latest follow-up blood culture showed no growth. Hemodialysis remains ongoing as per renal services. Right IJ hemodialysis catheter was removed 10/23/2019 with placement of new tunneled catheter 10/26/2019 for continued hemodialysis per renal services. Noted on 10/27/2019 patient with altered mental status follow-up cranial CT scan completed that showed evidence of a new small frontal lobe white matter infarction new since MRI of 10/20/2019 near the left frontal horn. No associated hemorrhage or mass-effect with follow-up neurology services recommending continued plan of care with ongoing Coumadin. Patient did have some nonspecific back pain MRI of the left hip as well as lumbar spine no findings suspicious for osteomyelitis or septic arthritis. There was some edema of the paraspinal and psoas muscles as well as osteodystrophy. She was fitted with a TLSO back brace only for comfort. Therapy evaluations have been completed and patient participating with therapies. Patient is to admit to CIR on 11/01/2019.   Patient currently requires max with mobility secondary to muscle weakness, impaired timing and sequencing, decreased coordination and decreased motor planning and decreased sitting balance, decreased standing balance, decreased postural control,  hemiplegia and decreased balance strategies. Prior to hospitalization, patient was modified independent  with mobility and lived with Son in a House home. Home access is 3Stairs to enter.  Patient will benefit from skilled PT intervention to maximize safe functional mobility, minimize fall risk and  decrease caregiver burden for planned discharge home with 24 hour assist. Anticipate patient will benefit from follow up [facility] at discharge.  PT - End of Session Activity Tolerance: Tolerates 30+ min activity with multiple rests Endurance Deficit: Yes Endurance Deficit Description: Fatigues easily with mild activity, required multiple rest breaks, verbal cues to keep eyes open PT Assessment Rehab Potential (ACUTE/IP ONLY): Good PT Barriers to Discharge: Medical stability PT Barriers to Discharge Comments: bilateral LE weakness L>R PT Patient demonstrates impairments in the following area(s): Balance;Endurance;Motor;Pain;Safety PT Transfers Functional Problem(s): Bed Mobility;Bed to Chair;Car PT Locomotion Functional Problem(s): Ambulation;Wheelchair Mobility;Stairs PT Plan PT Intensity: Minimum of 1-2 x/day ,45 to 90 minutes PT Frequency: 5 out of 7 days PT Duration Estimated Length of Stay: 16-18 days PT Treatment/Interventions: Ambulation/gait training;Discharge planning;DME/adaptive equipment instruction;Functional mobility training;Pain management;Psychosocial support;Splinting/orthotics;Therapeutic Activities;UE/LE Strength taining/ROM;Balance/vestibular training;Community reintegration;Disease management/prevention;Functional electrical stimulation;Neuromuscular re-education;Patient/family education;Stair training;Therapeutic Exercise;UE/LE Coordination activities;Wheelchair propulsion/positioning PT Transfers Anticipated Outcome(s): Supervision with LRAD PT Locomotion Anticipated Outcome(s): Supervision with LRAD PT Recommendation Follow Up Recommendations: Home health PT Patient destination: Home Equipment Recommended: To be determined Equipment Details: Has RW and rollator at home  Skilled Therapeutic Intervention Evaluation completed (see details above and below) with education on PT POC and goals and individual treatment initiated with focus on functional mobility/transfers, LE  strength, midline orientation and postural alignment, sitting and standing balance, pre-gait activities, and improved endurance with activity.   Received pt supine in bed, pt educated on PT evaluation and agreed. Pt initially denied any pain but reported increased low back pain with mobility but did not state number. Therapist assisted with tightening incontinence brief and pt donned pants in supine with mod A for LE management and to get pants over hips by rolling. Pt rolled to L and R x 4 trials throughout session with min A and using bedrails with HOB flat. Pt transferred supine<>sit mod A with heavy reliance on bedrails. In sitting pt relied heavily on bilateral UE's for support and demonstrated strong L lateral learn requiring max A to correct. Pt required max verbal cues to lean to R but unable to maintain midline orientation. Pt stated that her back hurt too much to remain sitting and requested to lay down to rest. Transferred sit<>supine mod A. Trial 2 pt transferred supine<>sit mod A, therapist donned TLSO total assist +2 to maintain midline orientation. Pt transferred sit<>stand with RW +2 assist and stand<>pivot to WC with RW +2 assist. Pt demonstrated bilateral LE knee flexion and was unable to extend knees despite max verbal cues. Pt with flexed trunk and narrow BOS during transfer. Pt repositioned in [facility] but reported severe increase in low back pain and requested to sit in recliner. Pt transferred stand<>pivot with RW +2 assist to recliner. Therapist provided repositioning with pillows for pt comfort. Concluded session with pt sitting in recliner, needs within reach, and seatbelt alarm on. RN aware of pt status.   PT Evaluation Precautions/Restrictions Precautions Precautions: Fall Required Braces or Orthoses: Spinal Brace Spinal Brace: Thoracolumbosacral orthotic;Other (comment) Spinal Brace Comments: for comfort with mobility. Restrictions Weight Bearing Restrictions: No Home Living/Prior  Functioning Home Living Living Arrangements: Children Available Help at Discharge: Family;Available 24 hours/day Type of Home: House Home Access: Stairs to enter Entrance  Stairs-Number of Steps: 3 Entrance Stairs-Rails: Right;Left;Can reach both Home Layout: One level Bathroom Shower/Tub: Multimedia programmer: Standard Bathroom Accessibility: No (has grab bars in bathroom to hold onto) Additional Comments: Pt lives with son who works from [employer] lives next door but spends the night every night and will be available 24/7  Lives With: Son Prior Function Level of Independence: Requires assistive device for independence  Able to Take Stairs?: Yes Driving: Yes 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, drives Cognition Overall Cognitive Status: Within Functional Limits for tasks assessed Arousal/Alertness: Awake/alert Orientation Level: Oriented X4 Awareness: Impaired Problem Solving: Impaired Safety/Judgment: Impaired Risk analyst Light Touch: Appears Intact Proprioception: Appears Intact Coordination Gross Motor Movements are Fluid and Coordinated: No Fine Motor Movements are Fluid and Coordinated: No Coordination and Movement Description: Strong L lateral lean, grossly uncoordinated Finger Nose Finger Test: decreased speed on LUE Heel Shin Test: decreased L>R used RUE to help lift LLE to shin Motor  Motor Motor: Abnormal postural alignment and control;Hemiplegia Motor - Skilled Clinical Observations: mild L hemiparesis, L lateral lean, grossly uncoordinated due to bilateral LE weakness  Mobility Bed Mobility Bed Mobility: Rolling Right;Rolling Left;Supine to Sit;Sit to Supine Rolling Right: Minimal Assistance - Patient > 75% Rolling Left: Minimal Assistance - Patient > 75% Supine to Sit: Moderate Assistance - Patient 50-74% Sit to Supine: Moderate Assistance - Patient  50-74% Transfers Transfers: Sit to Stand;Stand to Sit;Stand Pivot Transfers Sit to Stand: 2 Helpers(and RW) Stand to Sit: 2 Helpers(and RW) Stand Pivot Transfers: 2 Helpers(and RW) Transfer (Assistive device): Rolling walker Trunk/Postural Assessment  Cervical Assessment Cervical Assessment: Exceptions to WFL(forward head posture) Thoracic Assessment Thoracic Assessment: Exceptions to WFL(kyphosis) Lumbar Assessment Lumbar Assessment: Exceptions to WFL(decreased rotation bilaterally) Postural Control Postural Control: Deficits on evaluation  Balance Balance Balance Assessed: Yes Static Sitting Balance Static Sitting - Balance Support: Bilateral upper extremity supported Static Sitting - Level of Assistance: 2: Max assist Dynamic Sitting Balance Dynamic Sitting - Balance Support: Bilateral upper extremity supported Dynamic Sitting - Level of Assistance: 2: Max Insurance risk surveyor Standing - Balance Support: Bilateral upper extremity supported(RW) Static Standing - Level of Assistance: 1: +2 Total assist Dynamic Standing Balance Dynamic Standing - Balance Support: Bilateral upper extremity supported(RW) Dynamic Standing - Level of Assistance: 1: +2 Total assist Extremity Assessment  RLE Assessment RLE Assessment: Exceptions to [facility] General Strength Comments: Grossly generalized to 4-/5 LLE Assessment LLE Assessment: Exceptions to [facility] General Strength Comments: Grossly generalized to 3+/5   Refer to Care Plan for Long Term Goals  Recommendations for other services: None   Discharge Criteria: Patient will be discharged from PT if patient refuses treatment 3 consecutive times without medical reason, if treatment goals not met, if there is a change in medical status, if patient makes no progress towards goals or if patient is discharged from hospital.  The above assessment, treatment plan, treatment alternatives and goals were discussed and mutually agreed  upon: by patient  Alfonse Alpers PT, DPT  11/02/2019, 7:45 AM

## 2019-11-02 NOTE — Progress Notes (Signed)
ANTICOAGULATION CONSULT NOTE - Follow-Up  Pharmacy Consult for Warfarin Indication: RIJ DVT, CVA  No Known Allergies  Patient Measurements: Height: 5\' 7"  (170.2 cm) Weight: 141 lb 8.6 oz (64.2 kg) IBW/kg (Calculated) : 61.6 Heparin Dosing Weight: 63 kg  Vital Signs: Temp: 98.9 F (37.2 C) (12/10 0522) Temp Source: Oral (12/10 0522) BP: 122/73 (12/10 0833) Pulse Rate: 86 (12/10 0833)  Labs: Recent Labs    10/31/19 0358 11/01/19 0421 11/02/19 0712  HGB 8.1* 7.7* 8.8*  HCT 25.6* 24.6* 28.3*  PLT 197 206 197  LABPROT 29.3* 28.0* 23.5*  INR 2.8* 2.6* 2.1*  HEPARINUNFRC 0.68  --   --   CREATININE 3.41* 5.59*  --     Estimated Creatinine Clearance: 9.2 mL/min (A) (by C-G formula based on SCr of 5.59 mg/dL (H)).  Assessment: 68 yr old female with ESRD who presented on 11/27 with new CVA and is now found to have RIJ DVT and pharmacy consulted to dose Warfarin.   Warfarin started 12/4. On Cipro + Cefepime dual coverage for endocarditis x6 weeks - noted drug interaction with Warfarin.   INR today is therapeutic however trended down (INR 2.1 << 2.6, goal of 2-3). Hgb up to 8.8, plts wnl. No bleeding noted.   Goal of Therapy:  Heparin level 0.3-0.5 units/ml  INR 2.0-3.0 Monitor platelets by anticoagulation protocol: Yes   Plan:  - Warfarin 4 mg x 1 dose at 1800 today - Daily PT/INR, CBC q72h - Will continue to monitor for any signs/symptoms of bleeding and will follow up with PT/INR in the a.m.    Thank you for allowing pharmacy to be a part of this patient's care.  Alycia Rossetti, PharmD, BCPS Clinical Pharmacist Clinical phone for 11/02/2019: [phone] 11/02/2019 2:39 PM   **Pharmacist phone directory can now be found on [email or web address] ([email or web address]).  Listed under [place].

## 2019-11-02 NOTE — Progress Notes (Signed)
Occupational Therapy Session Note  Patient Details  Name: [name] MRN: [id number] Date of Birth: 05-Jan-1951  Today's Date: 11/02/2019 OT Individual Time: 1350-1415 OT Individual Time Calculation (min): 25 min    Short Term Goals: Week 1:  OT Short Term Goal 1 (Week 1): Pt will perform shower transfer with mod A. OT Short Term Goal 2 (Week 1): Pt will perform toileting with mod A. OT Short Term Goal 3 (Week 1): Pt will don back brace with min A overall. OT Short Term Goal 4 (Week 1): Pt will perform LB dressing with max A overall.  Skilled Therapeutic Interventions/Progress Updates:  Balance/vestibular training;Self Care/advanced ADL retraining;UE/LE Coordination activities;Functional mobility training;Neuromuscular re-education;Community reintegration;Wheelchair propulsion/positioning;Discharge planning;Pain management;Therapeutic Activities;Patient/family education;Therapeutic Exercise;DME/adaptive equipment instruction;UE/LE Strength taining/ROM   1:1 Pt in bed when arrived. Pt encouraged to try to participate this pm after reporting had pain with earlier mobility and fatigued.   Pt able to come to EOB on the left side of the bed with min A with extra time. A needed for trunk support to come into sitting. Pt required min to mod A for sitting balance due to lateral lean to the left. Pt able to verbalize she leans to the left and could self correct when asked but difficulty maintaining it. Pt recalled walking the other day and willing to try again but wasn't sure if her pain would let her. Pt requested to don her TLSO prior to getting up. Pt came into standing with min A (with 2nd person for safety)- however unable to maintain standing position for longer than 20 seconds and unable to take a step forward. Pt reports difficulty with full weight bearing through left LE. Pt able to scoot to the [facility] with min A and return to supine with min A (with ability to pull both LEs in the bed). See  below for response to her pain.   Left her in bed resting with call bell.    Therapy Documentation Precautions:  Precautions Precautions: Fall Precaution Comments: Strong L lateral lean, TLSO for mobility Required Braces or Orthoses: Other Brace(TLSO) Spinal Brace: Thoracolumbosacral orthotic Spinal Brace Comments: for comfort with mobility Restrictions Weight Bearing Restrictions: No General: General OT Amount of Missed Time: 35 Minutes Vital Signs: Therapy Vitals Temp: 100 F (37.8 C) Pulse Rate: 97 Resp: 15 BP: 120/75 Patient Position (if appropriate): Lying Oxygen Therapy SpO2: 96 % O2 Device: Room Air Pain: Pt reports pain in left hip and down through anterior portion of thigh. Pt able to participate in therapy minimally before requesting to return to supine.   Applied kpad on left buttock around to left thigh and pt also placed vibrating pillow on left LE. Pt left resting in bed and reported pain decr with laying down. RN made aware  Therapy/Group: Individual Therapy  Willeen Cass [facility] 11/02/2019, 8:06 PM

## 2019-11-02 NOTE — Care Management Note (Signed)
Inpatient [facility] Individual Statement of Services  Patient Name:  [name]  Date:  11/02/2019  Welcome to the [facility].  Our goal is to provide you with an individualized program based on your diagnosis and situation, designed to meet your specific needs.  With this comprehensive rehabilitation program, you will be expected to participate in at least 3 hours of rehabilitation therapies Monday-Friday, with modified therapy programming on the weekends.  Your rehabilitation program will include the following services:  Physical Therapy (PT), Occupational Therapy (OT), 24 hour per day rehabilitation nursing, Neuropsychology, Case Management (Social Worker), Rehabilitation Medicine, Nutrition Services and Pharmacy Services  Weekly team conferences will be held on Wednesday to discuss your progress.  Your Social Worker will talk with you frequently to get your input and to update you on team discussions.  Team conferences with you and your family in attendance may also be held.  Expected length of stay: 16-18 days  Overall anticipated outcome: supervision-CGA level  Depending on your progress and recovery, your program may change. Your Social Worker will coordinate services and will keep you informed of any changes. Your Social Worker's name and contact numbers are listed  below.  The following services may also be recommended but are not provided by the [facility] will be made to provide these services after discharge if needed.  Arrangements include referral to agencies that provide these services.  Your insurance has been verified to be:  [employer] Your primary doctor is:  Consuello Masse  Pertinent information will be shared with your doctor and your insurance company.  Social Worker:  Ovidio Kin, [place] or [phone]  Information discussed with and copy given to patient by: Elease Hashimoto, 11/02/2019, 10:18 AM

## 2019-11-03 ENCOUNTER — Encounter (HOSPITAL_COMMUNITY): Payer: Self-pay | Admitting: Physical Medicine & Rehabilitation

## 2019-11-03 ENCOUNTER — Inpatient Hospital Stay (HOSPITAL_COMMUNITY): Payer: Medicare PPO | Admitting: Speech Pathology

## 2019-11-03 ENCOUNTER — Inpatient Hospital Stay (HOSPITAL_COMMUNITY): Payer: Medicare PPO

## 2019-11-03 ENCOUNTER — Inpatient Hospital Stay (HOSPITAL_COMMUNITY): Payer: Medicare PPO | Admitting: Occupational Therapy

## 2019-11-03 DIAGNOSIS — I639 Cerebral infarction, unspecified: Secondary | ICD-10-CM | POA: Diagnosis not present

## 2019-11-03 LAB — CBC
HCT: 26.7 % — ABNORMAL LOW (ref 36.0–46.0)
Hemoglobin: 8.2 g/dL — ABNORMAL LOW (ref 12.0–15.0)
MCH: 28.5 pg (ref 26.0–34.0)
MCHC: 30.7 g/dL (ref 30.0–36.0)
MCV: 92.7 fL (ref 80.0–100.0)
Platelets: 182 10*3/uL (ref 150–400)
RBC: 2.88 MIL/uL — ABNORMAL LOW (ref 3.87–5.11)
RDW: 15.9 % — ABNORMAL HIGH (ref 11.5–15.5)
WBC: 9 10*3/uL (ref 4.0–10.5)
nRBC: 0 % (ref 0.0–0.2)

## 2019-11-03 LAB — PROTIME-INR
INR: 2.5 — ABNORMAL HIGH (ref 0.8–1.2)
Prothrombin Time: 27.1 seconds — ABNORMAL HIGH (ref 11.4–15.2)

## 2019-11-03 LAB — CULTURE, BLOOD (ROUTINE X 2)
Culture: NO GROWTH
Culture: NO GROWTH
Special Requests: ADEQUATE

## 2019-11-03 LAB — RENAL FUNCTION PANEL
Albumin: 1.9 g/dL — ABNORMAL LOW (ref 3.5–5.0)
Anion gap: 12 (ref 5–15)
BUN: 38 mg/dL — ABNORMAL HIGH (ref 8–23)
CO2: 28 mmol/L (ref 22–32)
Calcium: 9.3 mg/dL (ref 8.9–10.3)
Chloride: 93 mmol/L — ABNORMAL LOW (ref 98–111)
Creatinine, Ser: 6.71 mg/dL — ABNORMAL HIGH (ref 0.44–1.00)
GFR calc Af Amer: 7 mL/min — ABNORMAL LOW (ref >60)
GFR calc non Af Amer: 6 mL/min — ABNORMAL LOW (ref >60)
Glucose, Bld: 254 mg/dL — ABNORMAL HIGH (ref 70–99)
Phosphorus: 2.8 mg/dL (ref 2.5–4.6)
Potassium: 4.8 mmol/L (ref 3.5–5.1)
Sodium: 133 mmol/L — ABNORMAL LOW (ref 135–145)

## 2019-11-03 LAB — GLUCOSE, CAPILLARY
Glucose-Capillary: 150 mg/dL — ABNORMAL HIGH (ref 70–99)
Glucose-Capillary: 182 mg/dL — ABNORMAL HIGH (ref 70–99)
Glucose-Capillary: 194 mg/dL — ABNORMAL HIGH (ref 70–99)
Glucose-Capillary: 203 mg/dL — ABNORMAL HIGH (ref 70–99)

## 2019-11-03 MED ORDER — HEPARIN SODIUM (PORCINE) 1000 UNIT/ML IJ SOLN
INTRAMUSCULAR | Status: AC
  Administered 2019-11-03: 3200 [IU]
  Filled 2019-11-03: qty 4

## 2019-11-03 MED ORDER — WARFARIN SODIUM 2.5 MG PO TABS
2.5000 mg | ORAL_TABLET | Freq: Once | ORAL | Status: AC
  Administered 2019-11-03: 2.5 mg via ORAL
  Filled 2019-11-03: qty 1

## 2019-11-03 MED ORDER — DOXERCALCIFEROL 4 MCG/2ML IV SOLN
INTRAVENOUS | Status: AC
  Filled 2019-11-03: qty 2

## 2019-11-03 NOTE — Evaluation (Addendum)
Speech Language Pathology Assessment and Plan  Patient Details  Name: [name] MRN: [id number] Date of Birth: 1951-05-22  SLP Diagnosis: Cognitive Impairments  Rehab Potential: Good ELOS: 16-18 days    Today's Date: 11/03/2019 SLP Individual Time: 1100-1157 SLP Individual Time Calculation (min): 57 min   Problem List:  Patient Active Problem List   Diagnosis Date Noted  . Cerebrovascular accident (CVA) of right basal ganglia ([place]) 11/01/2019  . Acute bacterial endocarditis   . Acute left-sided weakness   . Acute ischemic stroke ([place]) 10/20/2019  . Hyperkalemia 09/10/2019  . End-stage renal disease on hemodialysis ([place])   . Nausea vomiting and diarrhea   . Bacteremia due to Pseudomonas 07/20/2019  . ESRD needing dialysis ([place]) 05/15/2019  . Anemia of chronic renal failure 04/03/2018  . Essential hypertension 04/03/2018  . Type 2 diabetes mellitus with diabetic nephropathy ([place]) 04/03/2018   Past Medical History:  Past Medical History:  Diagnosis Date  . Arthritis    hands  . Constipation   . Diabetes mellitus without complication (HCC)    Type II  . ESRD (end stage renal disease) ([place])     M/W/F- Hemodialysis  . GERD (gastroesophageal reflux disease)   . Headache   . Hyperlipidemia   . Hypertension   . Irregular heart rate   . Stroke [facility])    TIA - approx 2010- no residual   Past Surgical History:  Past Surgical History:  Procedure Laterality Date  . ABDOMINAL HYSTERECTOMY    . AORTIC ARCH ANGIOGRAPHY N/A 03/28/2019   Procedure: AORTIC ARCH ANGIOGRAPHY;  Surgeon: Waynetta Sandy, MD;  Location: [place] [facility];  Service: Cardiovascular;  Laterality: N/A;  . AV FISTULA PLACEMENT Left 06/13/2018   Procedure: ARTERIOVENOUS (AV) FISTULA CREATION;  Surgeon: Rosetta Posner, MD;  Location: [place];  Service: Vascular;  Laterality: Left;  . AV FISTULA PLACEMENT Left 08/18/2018   Procedure: INSERTION OF 4-7MM X 45CM ARTERIOVENOUS (AV) GORE-TEX GRAFT LEFT   FOREARM;  Surgeon: Rosetta Posner, MD;  Location: [place];  Service: Vascular;  Laterality: Left;  . AV FISTULA PLACEMENT Right 04/06/2019   Procedure: INSERTION OF ARTERIOVENOUS (AV) GORE-TEX GRAFT RIGHT UPPER ARM;  Surgeon: Waynetta Sandy, MD;  Location: [place];  Service: Vascular;  Laterality: Right;  . AV FISTULA PLACEMENT Right 04/13/2019   Procedure: INSERTION OF ARTERIOVENOUS (AV) BOVINE  ARTEGRAFT GRAFT RIGHT UPPER EXTREMITY;  Surgeon: Serafina Mitchell, MD;  Location: [place];  Service: Vascular;  Laterality: Right;  . [place] REMOVAL Right 05/16/2019   Procedure: REMOVAL OF ARTERIOVENOUS GORETEX GRAFT ([place]) RIGHT ARM;  Surgeon: Angelia Mould, MD;  Location: [place];  Service: Vascular;  Laterality: Right;  . BASCILIC VEIN TRANSPOSITION Right 03/07/2019   Procedure: First Stage Bascilic Vein Transposition Right Arm;  Surgeon: Serafina Mitchell, MD;  Location: [place];  Service: Vascular;  Laterality: Right;  . CATARACT EXTRACTION Right 2005  . INSERTION OF DIALYSIS CATHETER N/A 08/18/2018   Procedure: INSERTION OF DIALYSIS CATHETER;  Surgeon: Rosetta Posner, MD;  Location: [place];  Service: Vascular;  Laterality: N/A;  . INSERTION OF DIALYSIS CATHETER Right 07/25/2019   Procedure: INSERTION OF DIALYSIS CATHETER RIGHT INTERNAL JUGULAR (TUNNELED);  Surgeon: Virl Cagey, MD;  Location: [facility];  Service: General;  Laterality: Right;  . IR FLUORO GUIDE CV LINE LEFT  10/26/2019  . IR FLUORO GUIDE CV LINE RIGHT  12/06/2018  . IR PTA ADDL CENTRAL DIALYSIS SEG THRU DIALY CIRCUIT RIGHT Right 12/06/2018  .  IR REMOVAL TUN CV CATH W/O FL  10/23/2019  . IR US GUIDE VASC ACCESS LEFT  10/26/2019  . TEE WITHOUT CARDIOVERSION N/A 10/26/2019   Procedure: TRANSESOPHAGEAL ECHOCARDIOGRAM (TEE);  Surgeon: Lelon Perla, MD;  Location: [place];  Service: Cardiovascular;  Laterality: N/A;  . THROMBECTOMY AND REVISION OF ARTERIOVENTOUS (AV) GORETEX  GRAFT Left 09/29/2018   Procedure: INSERTION OF  ARTERIOVENTOUS (AV) GORETEX  GRAFT ARM;  Surgeon: Waynetta Sandy, MD;  Location: [place];  Service: Vascular;  Laterality: Left;  . UPPER EXTREMITY ANGIOGRAPHY Right 03/28/2019   Procedure: UPPER EXTREMITY ANGIOGRAPHY;  Surgeon: Waynetta Sandy, MD;  Location: [place] [facility];  Service: Cardiovascular;  Laterality: Right;  . VENOGRAM  10/27/2018   Procedure: Venogram;  Surgeon: Marty Heck, MD;  Location: [place] [facility];  Service: Cardiovascular;;  bilateral arm    Assessment / Plan / Recommendation Clinical Impression   HPI: [name] is a 68 year old right-handed female with history of end-stage renal disease with hemodialysis Monday Wednesday Fridays, diet controlled diabetes mellitus, pseudomonal bacteremia and early discitis August 2020 completing a 6-week course of Tressie Ellis as well as history of infected AV graft, TIA 2010 maintained on aspirin and Plavix, hypertension, hyperlipidemia.  Per chart review she lives with her brother.  Independent prior to admission.  Presented 10/20/2019 with left-sided weakness and difficulty with ambulation.  Her last dialysis was 10/18/2019.  CT of the brain showed acute right basal ganglia infarction.  Patient did not receive TPA.  Admission chemistries with creatinine 7.76, BUN 56, WBC 15,700, hemoglobin 9.7, SARS coronavirus negative.  MRI/MRA showed acute right basal ganglia infarction chronic ischemia with multiple old infarctions.  Negative head MRA.  Carotid Dopplers unremarkable.  Echocardiogram with ejection fraction of 65% without emboli or PFO.  Hospital course complicated by low-grade fever of 101 with findings of right IJ DVT on Dopplers.  Patient was placed on intravenous heparin and placed on Coumadin that she would remain on for approximately 3 months.  Infectious disease work-up during fever revealed Pseudomonas by BCID.  TEE performed on 10/26/2019 demonstrates large vegetation of mitral valve.  CTS consulted Dr.  Darcey Nora felt the best course of action was continued IV antibiotics no current plan for mitral valve repair or replacement due to risk of recurrent infection at this time and current recommendations are to continue antibiotics 6 weeks with cefepime as well as Cipro with end date to be established.  Latest follow-up blood culture showed no growth.  Hemodialysis remains ongoing as per renal services.  Right IJ hemodialysis catheter was removed 10/23/2019 with placement of new tunneled catheter 10/26/2019 for continued hemodialysis per renal services.  Noted on 10/27/2019 patient with altered mental status follow-up cranial CT scan completed that showed evidence of a new small frontal lobe white matter infarction new since MRI of 10/20/2019 near the left frontal horn.  No associated hemorrhage or mass-effect with follow-up neurology services recommending continued plan of care with ongoing Coumadin.  Patient did have some nonspecific back pain MRI of the left hip as well as lumbar spine no findings suspicious for osteomyelitis or septic arthritis.  There was some edema of the paraspinal and psoas muscles as well as osteodystrophy.  She was fitted with a TLSO back brace only for comfort.  Therapy evaluations have been completed and patient participating with therapies.  Patient was admitted for a comprehensive rehab program 11/01/19 and SLP evaluations were completed 11/03/19 with results as follows:  Bedside swallow evaluation: Pt  presents with oropharyngeal swallow function WFL. She consumed thin liquids via straw and regular texture solid snack with efficient mastication, oral clearance, and no overt s/sx aspiration. Pt denies any difficulty consuming meals prior to SLP's arrival. Recommend continue current regular texture diet, thin liquids, meds whole, no supervision required. No ST is indicated for swallowing.  Cognitive-linguistic evaluation: Pt presents with mild-moderate functional cognitive deficits  primarily impacting problem solving, anticipatory awareness, short term recall, and selective attention. Although on family present to confirm, pt reported her short term memory and ability to "focus" are different from baseline. When questioned, pt's safety awareness appeared in tact, however she was unable to anticipate any ADLs/general areas she will need assistance with upon discharge. She reports family helped with medication management at baseline, however she kept track of due dates for her finances. Scores on the Cognistat below normal limits were as follows: Attention: 2 (moderate impairment) Visual construction: 1 (moderate-severe impairment) Short term memory: 6 (moderate impairment) Calculations: 2 (mild impairment) Similarities/abstract reasoning: 3 (moderate impairment)  Judgement: 3 (mild impairment)  Recommend pt receive skilled ST to address cognitive deficits detailed above in order to maximize safety, functional independence, and reduce burden of care upon discharge.   Skilled Therapeutic Interventions          Bedside swallow and cognitive linguistic evaluation completed and results were reviewed with pt (see above for details).    SLP Assessment  Patient will need skilled Speech Lanaguage Pathology Services during CIR admission    Recommendations  SLP Diet Recommendations: Thin;Age appropriate regular solids Liquid Administration via: Cup;Straw Medication Administration: Whole meds with liquid Supervision: Patient able to self feed Compensations: Minimize environmental distractions;Slow rate;Small sips/bites Postural Changes and/or Swallow Maneuvers: Seated upright 90 degrees;Upright 30-60 min after meal Oral Care Recommendations: Oral care BID Patient destination: Home Follow up Recommendations: 24 hour supervision/assistance;Home Health SLP Equipment Recommended: None recommended by SLP    SLP Frequency 3 to 5 out of 7 days   SLP Duration  SLP Intensity  SLP  Treatment/Interventions 16-18 days  Minumum of 1-2 x/day, 30 to 90 minutes  Functional tasks;Cueing hierarchy;Environmental controls;Internal/external aids;Patient/family education;Cognitive remediation/compensation    Pain Pain Assessment Pain Scale: Faces Pain Score: Asleep Faces Pain Scale: Hurts little more Pain Type: Chronic pain Pain Location: Back Pain Orientation: Lower Pain Radiating Towards: leg Pain Descriptors / Indicators: Grimacing Pain Frequency: Constant Pain Onset: On-going Patients Stated Pain Goal: 4 Pain Intervention(s): Distraction;Repositioned Multiple Pain Sites: No      SLP Evaluation Cognition Overall Cognitive Status: Impaired/Different from baseline Arousal/Alertness: Awake/alert Orientation Level: Oriented X4 Attention: Selective Sustained Attention: Appears intact Selective Attention: Impaired Selective Attention Impairment: Verbal basic Memory: Impaired Memory Impairment: Decreased short term memory Decreased Short Term Memory: Verbal basic;Functional basic Awareness Impairment: Anticipatory impairment Problem Solving: Impaired Problem Solving Impairment: Functional basic;Verbal basic Executive Function: Reasoning Reasoning: Impaired Reasoning Impairment: Verbal basic Safety/Judgment: Appears intact  Comprehension Auditory Comprehension Overall Auditory Comprehension: Appears within functional limits for tasks assessed Commands: Within Functional Limits Conversation: Simple Visual Recognition/Discrimination Discrimination: Not tested Reading Comprehension Reading Status: Not tested Expression Expression Primary Mode of Expression: Verbal Verbal Expression Overall Verbal Expression: Appears within functional limits for tasks assessed Initiation: No impairment Level of Generative/Spontaneous Verbalization: Sentence Repetition: No impairment Naming: No impairment Pragmatics: No impairment Non-Verbal Means of Communication: Not  applicable Written Expression Dominant Hand: Right Written Expression: Not tested Oral Motor Oral Motor/Sensory Function Overall Oral Motor/Sensory Function: Mild impairment Facial Symmetry: Abnormal symmetry left Velum: Within Functional Limits Mandible:  Within Functional Limits Motor Speech Overall Motor Speech: Appears within functional limits for tasks assessed Respiration: Within functional limits Phonation: Normal Resonance: Within functional limits Articulation: Within functional limitis Intelligibility: Intelligible Motor Planning: Witnin functional limits Motor Speech Errors: Not applicable    Bedside Swallowing Assessment General Date of Onset: 10/27/19 Previous Swallow Assessment: BSE 10/27/19 Diet Prior to this Study: Regular;Thin liquids Temperature Spikes Noted: N/A Respiratory Status: Room air History of Recent Intubation: No Behavior/Cognition: Lethargic/Drowsy;Cooperative Oral Cavity - Dentition: Dentures, top;Dentures, bottom Self-Feeding Abilities: Needs set up Vision: Functional for self-feeding Volitional Cough: Strong Volitional Swallow: Able to elicit  Oral Care Assessment Does patient have any of the following "high(er) risk" factors?: None of the above Does patient have any of the following "at risk" factors?: None of the above Patient is LOW RISK: Follow universal precautions (see row information) Ice Chips Ice chips: Not tested Thin Liquid Thin Liquid: Within functional limits Presentation: Cup;Straw Nectar Thick Nectar Thick Liquid: Not tested Honey Thick Honey Thick Liquid: Not tested Puree Puree: Not tested Solid Solid: Within functional limits BSE Assessment Suspected Esophageal Findings Suspected Esophageal Findings: Belching Risk for Aspiration Impact on safety and function: No limitations Other Related Risk Factors: History of GERD  Short Term Goals: Week 1: SLP Short Term Goal 1 (Week 1): Pt will demonstrate functional  problem solving during basic tasks with Min A verbal/visual cues SLP Short Term Goal 2 (Week 1): Pt will demonstrate recall of day to day/new information with Min A verbal cues for use of compensatory memory strategies SLP Short Term Goal 3 (Week 1): Pt will selectively attend to functional tasks with Min A verbal cues for redirection SLP Short Term Goal 4 (Week 1): Pt will demonstrate anticipatory awareness by stating 2 ADLs she will need assistance with upon her return home with Min A verbal/visual cues  Refer to Care Plan for Long Term Goals  Recommendations for other services: None   Discharge Criteria: Patient will be discharged from SLP if patient refuses treatment 3 consecutive times without medical reason, if treatment goals not met, if there is a change in medical status, if patient makes no progress towards goals or if patient is discharged from hospital.  The above assessment, treatment plan, treatment alternatives and goals were discussed and mutually agreed upon: by patient  Arbutus Leas 11/03/2019, 12:12 PM

## 2019-11-03 NOTE — Procedures (Signed)
Patient seen on Hemodialysis. BP 116/74   Pulse 91   Temp 98.6 F (37 C) (Oral)   Resp 18   Ht 5\' 7"  (1.702 m)   Wt 63.1 kg   SpO2 96%   BMI 21.79 kg/m   QB 400, UF goal 2L Tolerating treatment without complaints at this time.   Elmarie Shiley MD [facility]. Office # [phone] Pager # [phone] 2:27 PM

## 2019-11-03 NOTE — Progress Notes (Signed)
Patient ID: [name], female   DOB: 06-29-1951, 68 y.o.   MRN: [id number]  [facility] Progress Note   Assessment/ Plan:   1.  Recurrent Pseudomonas bacteremia (Likely HD CRB) with evidence of mitral valve endocarditis prompting exchange of hemodialysis catheter awaiting repeat blood cultures and recommendations by ID.  No surgical indications for repair/replacement at this time per CT surgery; medical management recommended with reevaluation post completion of Cefepime/ciprofloxacin. 2. ESRD: Continue HD on Monday/Wednesday/Friday dialysis schedule with next HD today.  3. Anemia: Hgb lower without obvious loss (likely significant MIC), redose ESA, no indications for PRBC transfusion.  4. CKD-MBD: Phosphorus level within acceptable range, monitor on renal diet/binders 5.  Acute CVA with evidence of chronic ischemia/old infarcts: Ongoing management per neurology, evidence of mitral valve endocarditis.  On intravenous cefepime and oral ciprofloxacin. 6. Hypertension:Blood pressure currently at goal status post recent CVA.  Subjective:   Reports to be feeling tired this morning from therapies earlier.    Objective:   BP 120/75 (BP Location: Left Arm)   Pulse 97   Temp 100 F (37.8 C) (Oral)   Resp 15   Ht 5\' 7"  (1.702 m)   Wt 64.2 kg   SpO2 96%   BMI 22.17 kg/m   Physical Exam: Gen: Sleeping comfortably in bed.  CVS: Regular rhythm, normal rate, S1 and S2 normal Resp: Clear to auscultation bilaterally, no audible rales or rhonchi Abd: Soft, flat, nontender Ext: No lower extremity edema.  Right IJ [facility]  Labs: BMET Recent Labs  Lab 10/28/19 0314 10/29/19 0319 10/30/19 0438 10/31/19 0358 11/01/19 0421  NA 136 138 135 133* 136  K 4.1 4.2 4.5 3.8 4.7  CL 98 100 97* 95* 96*  CO2 25 27 25 26 27   GLUCOSE 146* 108* 175* 166* 158*  BUN 38* 12 32* 13 31*  CREATININE 6.70* 3.67* 5.81* 3.41* 5.59*  CALCIUM 8.4* 8.6* 8.5* 8.4* 8.9  PHOS 5.0* 3.6 4.7* 3.3  --     CBC Recent Labs  Lab 10/31/19 0358 11/01/19 0421 11/02/19 0712 11/03/19 0612  WBC 10.9* 8.7 9.2 9.0  HGB 8.1* 7.7* 8.8* 8.2*  HCT 25.6* 24.6* 28.3* 26.7*  MCV 91.8 91.4 93.1 92.7  PLT 197 206 197 182    Medications:    . calcium carbonate  1 tablet Oral 2 times per day  . carvedilol  6.25 mg Oral BID  . Chlorhexidine Gluconate Cloth  6 each Topical Daily  . ciprofloxacin  500 mg Oral q1800  . doxercalciferol  1.5 mcg Intravenous Q M,W,F-HD  . feeding supplement (ENSURE ENLIVE)  237 mL Oral BID BM  . gabapentin  100 mg Oral QHS  . insulin aspart  0-9 Units Subcutaneous TID WC  . isosorbide mononitrate  30 mg Oral QPC lunch  . multivitamin  1 tablet Oral QPC lunch  . senna-docusate  1 tablet Oral QHS  . warfarin  2.5 mg Oral ONCE-1800  . Warfarin - Pharmacist Dosing Inpatient   Does not apply [id number]   Elmarie Shiley, MD 11/03/2019, 9:54 AM

## 2019-11-03 NOTE — Progress Notes (Signed)
Physical Therapy Session Note  Patient Details  Name: [name] MRN: [id number] Date of Birth: 09-02-1951  Today's Date: 11/03/2019 PT Individual Time: 1003-1033 PT Individual Time Calculation (min): 30 min   Short Term Goals: Week 1:  PT Short Term Goal 1 (Week 1): pt will transfer supine<>sit and sit<>supine min A PT Short Term Goal 2 (Week 1): Pt will transfer stand<>pivot with LRAD mod A PT Short Term Goal 3 (Week 1): Pt will perform car transfer with LRAD mod A Week 2:    Week 3:     Skilled Therapeutic Interventions/Progress Updates:      Therapy Documentation Precautions:  Precautions Precautions: Fall Precaution Comments: Strong L lateral lean, TLSO for mobility Required Braces or Orthoses: Other Brace(TLSO) Spinal Brace: Thoracolumbosacral orthotic Spinal Brace Comments: for comfort with mobility Restrictions Weight Bearing Restrictions: No  PAIN  10/10 back radiating to L leg/knee, treatment session limited due to inability to relieve pain w/repositioining, TLSO, mult efforts.  Pain severity increased rather than decreased until returned sidelying L.  Pt seen bedside for rx   Initially sidelying and agreeable to treatment.  Sidelying to sit w/mod assist but leans L/pushes w/ RUE.  Pt stated increased back pain in sitting , min to mod assist for balance due to pushing/pain,  therapist donned TLSO for comfort, adjusted/tightened repeatedly w/no relief.  Attempted sitting balance/reaching w/RUE but pt could not tolerate unsupported sitting, progressively increasing c/o back pain.  Max assist SPT to wc and attempted repositioning for comfort but pt complaining of increasing pain radiating into LLE.  Attempted wc propulsion w/bilat LE's pt pt could no longer tolerate activity or OOB/sitting.   Max assist wc to bed squat pvt and returned to L sidelying w/cga, some relief of pain.  Nursing notified and pt provided w/tylenol.     Therapy/Group: Individual Therapy   Callie Fielding, [place] 11/03/2019, 12:35 PM

## 2019-11-03 NOTE — Progress Notes (Addendum)
[place] PHYSICAL MEDICINE & REHABILITATION PROGRESS NOTE   Subjective/Complaints: No complaints this morning. Denies pain. Slept well last night. Still sleepy.   Objective:   No results found. Recent Labs    11/02/19 0712 11/03/19 0612  WBC 9.2 9.0  HGB 8.8* 8.2*  HCT 28.3* 26.7*  PLT 197 182   Recent Labs    11/01/19 0421  NA 136  K 4.7  CL 96*  CO2 27  GLUCOSE 158*  BUN 31*  CREATININE 5.59*  CALCIUM 8.9    Intake/Output Summary (Last 24 hours) at 11/03/2019 0920 Last data filed at 11/02/2019 1845 Gross per 24 hour  Intake 320 ml  Output --  Net 320 ml     Physical Exam: Vital Signs Blood pressure 120/75, pulse 97, temperature 100 F (37.8 C), temperature source Oral, resp. rate 15, height 5\' 7"  (1.702 m), weight 64.2 kg, SpO2 96 %.  Gen: no distress, normal appearing, lying in bed HEENT: oral mucosa pink and moist, NCAT Cardio: Reg rate Chest: normal effort, normal rate of breathing Abd: soft, non-distended Ext: no edema Skin: intact Neurological: 4+/5 strength throughout. Sensation intact.  Patient is sitting up in bed.  Easily arousable.  Makes good eye contact with examiner follows full commands.  She provides her name and age.  Fair insight to her deficits.  Psych: pleasant, normal affect   Assessment/Plan: 1. Functional deficits secondary to right basal ganglia stroke which require 3+ hours per day of interdisciplinary therapy in a comprehensive inpatient rehab setting.  Physiatrist is providing close team supervision and 24 hour management of active medical problems listed below.  Physiatrist and rehab team continue to assess barriers to discharge/monitor patient progress toward functional and medical goals  Care Tool:  Bathing    Body parts bathed by patient: Left arm, Chest, Abdomen, Front perineal area, Right upper leg, Left upper leg, Face   Body parts bathed by helper: Right arm, Buttocks, Right lower leg, Left lower leg      Bathing assist Assist Level: Moderate Assistance - Patient 50 - 74%     Upper Body Dressing/Undressing Upper body dressing   What is the patient wearing?: Pull over shirt    Upper body assist Assist Level: Moderate Assistance - Patient 50 - 74%    Lower Body Dressing/Undressing Lower body dressing      What is the patient wearing?: Incontinence brief, Pants     Lower body assist Assist for lower body dressing: Total Assistance - Patient < 25%     Toileting Toileting    Toileting assist Assist for toileting: Moderate Assistance - Patient 50 - 74%     Transfers Chair/bed transfer  Transfers assist     Chair/bed transfer assist level: Maximal Assistance - Patient 25 - 49%     Locomotion Ambulation   Ambulation assist   Ambulation activity did not occur: Safety/medical concerns(back pain, bilateral LE weakness, poor bilateral knee control)          Walk 10 feet activity   Assist  Walk 10 feet activity did not occur: Safety/medical concerns(back pain, bilateral LE weakness, poor bilateral knee control)        Walk 50 feet activity   Assist Walk 50 feet with 2 turns activity did not occur: Safety/medical concerns(back pain, bilateral LE weakness, poor bilateral knee control)         Walk 150 feet activity   Assist Walk 150 feet activity did not occur: Safety/medical concerns(back pain, bilateral LE weakness, poor  bilateral knee control)         Walk 10 feet on uneven surface  activity   Assist Walk 10 feet on uneven surfaces activity did not occur: Safety/medical concerns(back pain, bilateral LE weakness, poor bilateral knee control)         Wheelchair     Assist Will patient use wheelchair at discharge?: Yes Type of Wheelchair: Manual Wheelchair activity did not occur: Safety/medical concerns(Pt could not tolerate sitting in WC for long enough to complete activity due to low back pain)         Wheelchair 50 feet with 2  turns activity    Assist    Wheelchair 50 feet with 2 turns activity did not occur: Safety/medical concerns(Pt could not tolerate sitting in WC for long enough to complete activity due to low back pain)       Wheelchair 150 feet activity     Assist  Wheelchair 150 feet activity did not occur: Safety/medical concerns(Pt could not tolerate sitting in WC for long enough to complete activity due to low back pain)       Blood pressure 120/75, pulse 97, temperature 100 F (37.8 C), temperature source Oral, resp. rate 15, height 5\' 7"  (1.702 m), weight 64.2 kg, SpO2 96 %.    Medical Problem List and Plan: 1.  Impaired gait with left side weakness secondary to right basal ganglia infarction as well as small cortical infarct left frontal lobe             -patient may shower             -ELOS/Goals: modI in PT, OT, I in SLP 2.  Antithrombotics: -DVT/anticoagulation: Chronic Coumadin for right IJ acute DVT.  Plan anticoagulation x3 months             -antiplatelet therapy: N/A 3. Pain Management chronic back and left hip pain: Neurontin 100 mg nightly, oxycodone as needed.  TLSO back brace for comfort  12/10, 12/11: Pain well controlled 4. Mood: Provide emotional support             -antipsychotic agents: N/A 5. Neuropsych: This patient is capable of making decisions on her own behalf. 6. Skin/Wound Care: Routine skin checks 7. Fluids/Electrolytes/Nutrition: Routine in and outs with follow-up chemistries 8.  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  New HD tunneled catheter placed 10/26/2019. 9.  ID/recurrent Pseudomonas bacteremia.  Continue Maxipime and Cipro as directed with infectious disease to discuss duration of antibiotic therapies anticipate 6 weeks. 10.  Hemodialysis MWF.  Follow-up per renal services.   12/10: Cr elevated this morning; appreciate nephrology follow-up 11.  Diabetes mellitus.   Hemoglobin A1c 7.6.  SSI. 12.  Hypertension.  Imdur 30 mg daily, Coreg 6.25 mg twice daily.  Monitor with increased mobility.  12/10, 12/11: BP stable 13.  Acute on chronic anemia.  Follow-up CBC  12/10: hgb improved 14. Constipation:  12/10: No BM yet. Will make prn senna-docusate scheduled.  15. Disposition: Upon discharge, patient should have follow-up with PCP, physiatry, infectious disease, and neurology.    LOS: 2 days A FACE TO FACE EVALUATION WAS PERFORMED  Yelena Metzer P Arlisa Leclere 11/03/2019, 9:20 AM

## 2019-11-03 NOTE — Progress Notes (Signed)
Occupational Therapy Session Note  Patient Details  Name: [name] MRN: [id number] Date of Birth: Dec 01, 1950  Today's Date: 11/03/2019 OT Individual Time: [id number] OT Individual Time Calculation (min): 56 min  and Today's Date: 11/03/2019 OT Missed Time: 60 Minutes Missed Time Reason: Pain   Short Term Goals: Week 1:  OT Short Term Goal 1 (Week 1): Pt will perform shower transfer with mod A. OT Short Term Goal 2 (Week 1): Pt will perform toileting with mod A. OT Short Term Goal 3 (Week 1): Pt will don back brace with min A overall. OT Short Term Goal 4 (Week 1): Pt will perform LB dressing with max A overall.  Skilled Therapeutic Interventions/Progress Updates:    Treatment session with focus on functional mobility, transfers, dynamic sitting balance during self-care tasks of bathing and dressing.  Pt received supine in bed agreeable to therapy session - requesting shower.  Pt completed bed mobility to come to sitting EOB with min assist.  Noted Lt lean in sitting, requiring min-mod assist for sitting balance.  Squat pivot transfer bed > w/c > shower chair with max assist.  Pt participation limited due to increasing back pain with mobility. RN notified and arrived during session to provide pain meds.  Encouraged pt to call RN ~30 mins prior to first therapy session to allow pain meds to begin working prior to therapy - as pt reports movement increases back pain.  Engaged in bathing seated in shower with pt able to complete small boost of hips off chair with min assist to allow therapist to wash buttocks.  Did not engage in any standing during therapy session this time due to increased pain in back.  Pt requested to return to bed to complete dressing.  Engaged in rolling with min assist and therapist donned incontinence brief and pants with pt attempting to pull pants over hips in sidelying, ultimately requiring assistance.  Pt remained in sidelying with k-pad on back for pain relief and  left with all needs in reach.  Therapy Documentation Precautions:  Precautions Precautions: Fall Precaution Comments: Strong L lateral lean, TLSO for mobility Required Braces or Orthoses: Other Brace(TLSO) Spinal Brace: Thoracolumbosacral orthotic Spinal Brace Comments: for comfort with mobility Restrictions Weight Bearing Restrictions: No General: General OT Amount of Missed Time: 19 Minutes Pain: Pain Assessment Pain Scale: 0-10 Pain Score: 8  Pain Type: Chronic pain Pain Location: Back Pain Orientation: Lower Pain Descriptors / Indicators: Aching;Discomfort;Grimacing;Moaning Pain Frequency: Constant Pain Onset: On-going Patients Stated Pain Goal: 4 Pain Intervention(s): Medication (See eMAR)   Therapy/Group: Individual Therapy  [name] 11/03/2019, 8:29 AM

## 2019-11-03 NOTE — Progress Notes (Signed)
ANTICOAGULATION CONSULT NOTE - Follow-Up  Pharmacy Consult for Warfarin Indication: RIJ DVT, CVA  No Known Allergies  Patient Measurements: Height: 5\' 7"  (170.2 cm) Weight: 141 lb 8.6 oz (64.2 kg) IBW/kg (Calculated) : 61.6 Heparin Dosing Weight: 63 kg  Vital Signs:    Labs: Recent Labs    11/01/19 0421 11/02/19 0712 11/03/19 0612  HGB 7.7* 8.8* 8.2*  HCT 24.6* 28.3* 26.7*  PLT 206 197 182  LABPROT 28.0* 23.5* 27.1*  INR 2.6* 2.1* 2.5*  CREATININE 5.59*  --   --     Estimated Creatinine Clearance: 9.2 mL/min (A) (by C-G formula based on SCr of 5.59 mg/dL (H)).  Assessment: 68 yr old female with ESRD who presented on 11/27 with new CVA and is now found to have RIJ DVT and pharmacy consulted to dose Warfarin.   Warfarin started 12/4. On Cipro + Cefepime dual coverage for endocarditis x6 weeks - noted drug interaction with Warfarin.   INR today is therapeutic and trending back up (INR 2.5 << 2.1, goal of 2-3). Hgb 8.2, plts wnl. No bleeding noted.   Goal of Therapy:  INR 2-3 Monitor platelets by anticoagulation protocol: Yes   Plan:  - Warfarin 2.5 mg x 1 dose at 1800 today - Daily PT/INR, CBC q72h - Will continue to monitor for any signs/symptoms of bleeding and will follow up with PT/INR in the a.m.    Thank you for allowing pharmacy to be a part of this patient's care.  Alycia Rossetti, PharmD, BCPS Clinical Pharmacist Clinical phone for 11/03/2019: [phone] 11/03/2019 9:22 AM   **Pharmacist phone directory can now be found on [email or web address] (PW [email or web address]).  Listed under [place].

## 2019-11-04 ENCOUNTER — Inpatient Hospital Stay (HOSPITAL_COMMUNITY): Payer: Medicare PPO

## 2019-11-04 ENCOUNTER — Inpatient Hospital Stay (HOSPITAL_COMMUNITY): Payer: Medicare PPO | Admitting: Occupational Therapy

## 2019-11-04 DIAGNOSIS — K5901 Slow transit constipation: Secondary | ICD-10-CM

## 2019-11-04 DIAGNOSIS — Z992 Dependence on renal dialysis: Secondary | ICD-10-CM

## 2019-11-04 DIAGNOSIS — I10 Essential (primary) hypertension: Secondary | ICD-10-CM

## 2019-11-04 DIAGNOSIS — N186 End stage renal disease: Secondary | ICD-10-CM

## 2019-11-04 LAB — GLUCOSE, CAPILLARY
Glucose-Capillary: 135 mg/dL — ABNORMAL HIGH (ref 70–99)
Glucose-Capillary: 137 mg/dL — ABNORMAL HIGH (ref 70–99)
Glucose-Capillary: 190 mg/dL — ABNORMAL HIGH (ref 70–99)
Glucose-Capillary: 153 mg/dL — ABNORMAL HIGH (ref 70–99)

## 2019-11-04 LAB — CBC
HCT: 28.8 % — ABNORMAL LOW (ref 36.0–46.0)
Hemoglobin: 8.9 g/dL — ABNORMAL LOW (ref 12.0–15.0)
MCH: 29 pg (ref 26.0–34.0)
MCHC: 30.9 g/dL (ref 30.0–36.0)
MCV: 93.8 fL (ref 80.0–100.0)
Platelets: 195 10*3/uL (ref 150–400)
RBC: 3.07 MIL/uL — ABNORMAL LOW (ref 3.87–5.11)
RDW: 15.6 % — ABNORMAL HIGH (ref 11.5–15.5)
WBC: 8.3 10*3/uL (ref 4.0–10.5)
nRBC: 0.4 % — ABNORMAL HIGH (ref 0.0–0.2)

## 2019-11-04 LAB — PROTIME-INR
INR: 2.4 — ABNORMAL HIGH (ref 0.8–1.2)
Prothrombin Time: 26.1 seconds — ABNORMAL HIGH (ref 11.4–15.2)

## 2019-11-04 MED ORDER — ACETAMINOPHEN 650 MG RE SUPP: 650.0000 mg | RECTAL | Status: DC | PRN

## 2019-11-04 MED ORDER — ACETAMINOPHEN 500 MG PO TABS
500.0000 mg | ORAL_TABLET | Freq: Three times a day (TID) | ORAL | Status: DC
  Administered 2019-11-04 – 2019-12-05 (×81): 500 mg via ORAL
  Filled 2019-11-04 (×86): qty 1

## 2019-11-04 MED ORDER — ACETAMINOPHEN 160 MG/5ML PO SOLN: 650.0000 mg | ORAL | Status: DC | PRN

## 2019-11-04 MED ORDER — ACETAMINOPHEN 325 MG PO TABS
650.0000 mg | ORAL_TABLET | Freq: Two times a day (BID) | ORAL | Status: DC | PRN
  Administered 2019-11-22: 11:00:00 650 mg via ORAL
  Filled 2019-11-04: qty 2

## 2019-11-04 MED ORDER — SORBITOL 70 % SOLN
60.0000 mL | Status: AC
  Administered 2019-11-04: 60 mL via ORAL

## 2019-11-04 MED ORDER — WARFARIN SODIUM 2.5 MG PO TABS
2.5000 mg | ORAL_TABLET | Freq: Once | ORAL | Status: AC
  Administered 2019-11-04: 2.5 mg via ORAL
  Filled 2019-11-04: qty 1

## 2019-11-04 NOTE — Progress Notes (Signed)
[place] PHYSICAL MEDICINE & REHABILITATION PROGRESS NOTE   Subjective/Complaints: No new issues. Has ongoing LBP which is often worst in morning. Has Kpad  ROS: Patient denies fever, rash, sore throat, blurred vision, nausea, vomiting, diarrhea, cough, shortness of breath or chest pain, , headache, or mood change.    Objective:   No results found. Recent Labs    11/03/19 0612 11/04/19 0546  WBC 9.0 8.3  HGB 8.2* 8.9*  HCT 26.7* 28.8*  PLT 182 195   Recent Labs    11/03/19 1320  NA 133*  K 4.8  CL 93*  CO2 28  GLUCOSE 254*  BUN 38*  CREATININE 6.71*  CALCIUM 9.3    Intake/Output Summary (Last 24 hours) at 11/04/2019 1205 Last data filed at 11/04/2019 0815 Gross per 24 hour  Intake 120 ml  Output 2000 ml  Net -1880 ml     Physical Exam: Vital Signs Blood pressure (!) 148/84, pulse 82, temperature 98.5 F (36.9 C), temperature source Oral, resp. rate 16, height 5\' 7"  (1.702 m), weight 60.4 kg, SpO2 100 %.  Constitutional: No distress . Vital signs reviewed. HEENT: EOMI, oral membranes moist Neck: supple Cardiovascular: RRR without murmur. No JVD    Respiratory: CTA Bilaterally without wheezes or rales. Normal effort    GI: BS +, non-tender, non-distended  Ext: no edema Skin: intact M/S: LB TTP and with rom Neurological: 4+/5 strength throughout. Sensation intact.  alert  Makes good eye contact with examiner follows full commands.  She provides her name and age.  Fair insight to her deficits.  Psych: pleasant, normal affect   Assessment/Plan: 1. Functional deficits secondary to right basal ganglia stroke which require 3+ hours per day of interdisciplinary therapy in a comprehensive inpatient rehab setting.  Physiatrist is providing close team supervision and 24 hour management of active medical problems listed below.  Physiatrist and rehab team continue to assess barriers to discharge/monitor patient progress toward functional and medical goals  Care  Tool:  Bathing    Body parts bathed by patient: Left arm, Chest, Abdomen, Front perineal area, Right upper leg, Left upper leg, Face   Body parts bathed by helper: Right arm, Buttocks, Right lower leg, Left lower leg     Bathing assist Assist Level: Moderate Assistance - Patient 50 - 74%     Upper Body Dressing/Undressing Upper body dressing   What is the patient wearing?: Pull over shirt    Upper body assist Assist Level: Moderate Assistance - Patient 50 - 74%    Lower Body Dressing/Undressing Lower body dressing      What is the patient wearing?: Incontinence brief, Pants     Lower body assist Assist for lower body dressing: Total Assistance - Patient < 25%     Toileting Toileting    Toileting assist Assist for toileting: Moderate Assistance - Patient 50 - 74%     Transfers Chair/bed transfer  Transfers assist     Chair/bed transfer assist level: Maximal Assistance - Patient 25 - 49%     Locomotion Ambulation   Ambulation assist   Ambulation activity did not occur: Safety/medical concerns(back pain, bilateral LE weakness, poor bilateral knee control)          Walk 10 feet activity   Assist  Walk 10 feet activity did not occur: Safety/medical concerns(back pain, bilateral LE weakness, poor bilateral knee control)        Walk 50 feet activity   Assist Walk 50 feet with 2 turns activity did  not occur: Safety/medical concerns(back pain, bilateral LE weakness, poor bilateral knee control)         Walk 150 feet activity   Assist Walk 150 feet activity did not occur: Safety/medical concerns(back pain, bilateral LE weakness, poor bilateral knee control)         Walk 10 feet on uneven surface  activity   Assist Walk 10 feet on uneven surfaces activity did not occur: Safety/medical concerns(back pain, bilateral LE weakness, poor bilateral knee control)         Wheelchair     Assist Will patient use wheelchair at discharge?:  Yes Type of Wheelchair: Manual Wheelchair activity did not occur: Safety/medical concerns(Pt could not tolerate sitting in WC for long enough to complete activity due to low back pain)         Wheelchair 50 feet with 2 turns activity    Assist    Wheelchair 50 feet with 2 turns activity did not occur: Safety/medical concerns(Pt could not tolerate sitting in WC for long enough to complete activity due to low back pain)       Wheelchair 150 feet activity     Assist  Wheelchair 150 feet activity did not occur: Safety/medical concerns(Pt could not tolerate sitting in WC for long enough to complete activity due to low back pain)       Blood pressure (!) 148/84, pulse 82, temperature 98.5 F (36.9 C), temperature source Oral, resp. rate 16, height 5\' 7"  (1.702 m), weight 60.4 kg, SpO2 100 %.    Medical Problem List and Plan: 1.  Impaired gait with left side weakness secondary to right basal ganglia infarction as well as small cortical infarct left frontal lobe             -patient may shower             -ELOS/Goals: modI in PT, OT, I in SLP 2.  Antithrombotics: -DVT/anticoagulation: Chronic Coumadin for right IJ acute DVT.  Plan anticoagulation x3 months             -antiplatelet therapy: N/A 3. Pain Management chronic back and left hip pain: Neurontin 100 mg nightly, oxycodone as needed.  TLSO back brace for comfort  12/12 increased back pain this morning   -kpad    -schedule tylenol 4. Mood: Provide emotional support             -antipsychotic agents: N/A 5. Neuropsych: This patient is capable of making decisions on her own behalf. 6. Skin/Wound Care: Routine skin checks 7. Fluids/Electrolytes/Nutrition: Routine in and outs with follow-up chemistries 8.  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  New HD tunneled catheter placed 10/26/2019. 9.  ID/recurrent Pseudomonas bacteremia.  Continue  Maxipime and Cipro as directed with infectious disease to discuss duration of antibiotic therapies anticipate 6 weeks. 10.  Hemodialysis MWF.  Follow-up per renal services.   12/10: Cr elevated this morning; appreciate nephrology follow-up 11.  Diabetes mellitus.  Hemoglobin A1c 7.6.  SSI. 12.  Hypertension.  Imdur 30 mg daily, Coreg 6.25 mg twice daily.  Monitor with increased mobility.  12/12 BP stable 13.  Acute on chronic anemia.  Follow-up CBC  12/10: hgb improved 14. Constipation:  12/12: No BM since 12/8    -sorbitol today. SSE if needed 15. Disposition: Upon discharge, patient should have follow-up with PCP, physiatry, infectious disease, and neurology.    LOS: 3 days A FACE TO [place] T  Naaman Plummer 11/04/2019, 12:05 PM

## 2019-11-04 NOTE — Progress Notes (Addendum)
ANTICOAGULATION CONSULT NOTE - Follow-Up  Pharmacy Consult for Warfarin Indication: RIJ DVT, CVA  No Known Allergies  Patient Measurements: Height: 5\' 7"  (170.2 cm) Weight: 133 lb 2.5 oz (60.4 kg) IBW/kg (Calculated) : 61.6 Heparin Dosing Weight: 63 kg  Vital Signs: Temp: 98.5 F (36.9 C) (12/12 0437) Temp Source: Oral (12/12 0437) BP: 148/84 (12/12 0437) Pulse Rate: 82 (12/12 0437)  Labs: Recent Labs    11/02/19 0712 11/03/19 0612 11/03/19 1320 11/04/19 0546  HGB 8.8* 8.2*  --  8.9*  HCT 28.3* 26.7*  --  28.8*  PLT 197 182  --  195  LABPROT 23.5* 27.1*  --  26.1*  INR 2.1* 2.5*  --  2.4*  CREATININE  --   --  6.71*  --     Estimated Creatinine Clearance: 7.5 mL/min (A) (by C-G formula based on SCr of 6.71 mg/dL (H)).  Assessment: 68 yr old female with ESRD who presented on 11/27 with new CVA and is now found to have RIJ DVT and pharmacy consulted to dose Warfarin.   Warfarin started 12/4. On Cipro + Cefepime dual coverage for endocarditis x6 weeks - noted drug interaction with Warfarin.   INR today is therapeutic (goal of 2-3). Hgb 8.2, plts wnl. No bleeding noted.   Goal of Therapy:  INR 2-3 Monitor platelets by anticoagulation protocol: Yes   Plan:  - Warfarin 2.5 mg x 1 dose at 1800 today - Daily PT/INR, CBC q72h - Will continue to monitor for any signs/symptoms of bleeding and will follow up with PT/INR in the a.m.    Thank you for allowing pharmacy to be a part of this patient's care.  Alycia Rossetti, PharmD, BCPS Clinical Pharmacist Clinical phone for 11/04/2019: [phone] 11/04/2019 10:46 AM   **Pharmacist phone directory can now be found on [email or web address] ([email or web address]).  Listed under [place].

## 2019-11-04 NOTE — IPOC Note (Signed)
Overall Plan of Care [facility]) Patient Details Name: Taleeyah Azcarate MRN: [id number] DOB: [birth year]  Admitting Diagnosis: Cerebrovascular accident (CVA) of right basal ganglia [facility])  Hospital Problems: Principal Problem:   Cerebrovascular accident (CVA) of right basal ganglia ([place])     Functional Problem List: Nursing Endurance, Medication Management, Motor, Pain, Safety, Skin Integrity  PT Balance, Endurance, Motor, Pain, Safety  OT Balance, Endurance, Pain, Safety  SLP Cognition  TR         Basic ADL's: OT Bathing, Dressing, Toileting, Grooming     Advanced  ADL's: OT       Transfers: PT Bed Mobility, Bed to Chair, Teacher, early years/pre, Tub/Shower     Locomotion: PT Ambulation, Emergency planning/management officer, Stairs     Additional Impairments: OT Fuctional Use of Upper Extremity  SLP Social Cognition   Problem Solving, Memory, Attention, Awareness  TR      Anticipated Outcomes Item Anticipated Outcome  Self Feeding n/a  Swallowing      Basic self-care  S  Toileting  S   Bathroom Transfers S  Bowel/Bladder  Mod I/Min assist for bowel.  HD MWF- anuric per chart review  Transfers  Supervision with LRAD  Locomotion  CGA with LRAD  Communication     Cognition  Supervision A  Pain  Chronic pain managed at 5 or less  Safety/Judgment  Supervision   Therapy Plan: PT Intensity: Minimum of 1-2 x/day ,45 to 90 minutes PT Frequency: 5 out of 7 days PT Duration Estimated Length of Stay: 16-18 days OT Intensity: Minimum of 1-2 x/day, 45 to 90 minutes OT Frequency: 5 out of 7 days OT Duration/Estimated Length of Stay: 2- 2.5 wks SLP Intensity: Minumum of 1-2 x/day, 30 to 90 minutes SLP Frequency: 3 to 5 out of 7 days SLP Duration/Estimated Length of Stay: 16-18 days   Due to the current state of emergency, patients may not be receiving their 3-hours of Medicare-mandated therapy.   Team Interventions: Nursing Interventions Patient/Family Education, Pain Management,  Medication Management, Discharge Planning, Psychosocial Support, Disease Management/Prevention, Cognitive Remediation/Compensation  PT interventions Ambulation/gait training, Discharge planning, DME/adaptive equipment instruction, Functional mobility training, Pain management, Psychosocial support, Splinting/orthotics, Therapeutic Activities, UE/LE Strength taining/ROM, Training and development officer, Community reintegration, Disease management/prevention, Technical sales engineer stimulation, Neuromuscular re-education, Patient/family education, IT trainer, Therapeutic Exercise, UE/LE Coordination activities, Wheelchair propulsion/positioning  OT Interventions Training and development officer, Self Care/advanced ADL retraining, UE/LE Coordination activities, Functional mobility training, Neuromuscular re-education, Academic librarian, Wheelchair propulsion/positioning, Discharge planning, Pain management, Therapeutic Activities, Patient/family education, Therapeutic Exercise, DME/adaptive equipment instruction, UE/LE Strength taining/ROM  SLP Interventions Functional tasks, English as a second language teacher, Environmental controls, Internal/external aids, Patient/family education, Cognitive remediation/compensation  TR Interventions    SW/CM Interventions Discharge Planning, Psychosocial Support, Patient/Family Education   Barriers to Discharge MD  Medical stability  Nursing Hemodialysis    PT Medical stability bilateral LE weakness L>R  OT Other (comments) none known at this time  SLP      SW       Team Discharge Planning: Destination: PT-Home ,OT- Home , SLP-Home Projected Follow-up: PT-Home health PT, OT-  Home health OT, 24 hour supervision/assistance, SLP-24 hour supervision/assistance, Home Health SLP Projected Equipment Needs: PT-To be determined, OT- To be determined, SLP-None recommended by SLP Equipment Details: PT-Has RW and rollator at home, OT-  Patient/family involved in discharge planning: PT-  Patient,  OT-Patient, SLP-Patient  MD ELOS: supervision Medical Rehab Prognosis:  Excellent Assessment: The patient has been admitted for CIR therapies with the diagnosis of right basal ganglia infarct.  The team will be addressing functional mobility, strength, stamina, balance, safety, adaptive techniques and equipment, self-care, bowel and bladder mgt, patient and caregiver education, NMR, orthotics, communication cognition. Goals have been set at supervision for basic mobility, self-care, cognition and communication.   Due to the current state of emergency, patients may not be receiving their 3 hours per day of Medicare-mandated therapy.    Meredith Staggers, MD, FAAPMR      See Team Conference Notes for weekly updates to the plan of care

## 2019-11-04 NOTE — Progress Notes (Signed)
Physical Therapy Session Note  Patient Details  Name: [name] MRN: [id number] Date of Birth: Jan 21, 1951  Today's Date: 11/04/2019 PT Individual Time: 0900-1000 PT Individual Time Calculation (min): 60 min   Short Term Goals: Week 1:  PT Short Term Goal 1 (Week 1): pt will transfer supine<>sit and sit<>supine min A PT Short Term Goal 2 (Week 1): Pt will transfer stand<>pivot with LRAD mod A PT Short Term Goal 3 (Week 1): Pt will perform car transfer with LRAD mod A  Skilled Therapeutic Interventions/Progress Updates:   Received pt supine in bed, pt agreeable to therapy, and stated pain 8/10 in low back with mobility. Pt reported she received pain medication prior to session. Session focused on functional mobility/transfers, improved tolerance to upright position, LE strength, sitting/standing balance, decreased pain, and improved tolerance to activity. Therapist donned socks total assist in supine and pants mod A for LE management and to pull over hips. Pt performed bed mobility with mod A and required mod A to maintain sitting balance due to L lateral lean. Pt donned pull over shirt mod A sitting EOB. Therapist donned TLSO seated EOB with total assist. Pt attempted sit<>stand with RW from bed x 2 trials max A. Trial 1 pt unable to come to upright position with flexed trunk and flexed bilateral knees and leaning elbows on RW. Trial 2: pt transferred stand<>pivot with RW bed<>WC max A with LLE buckling. Pt performed WC mobility 84ft using bilateral UE's and min A to avoid obstacles on L. Pt reported increase in LBP and requested to stop activity. Therapist provided cushion for [facility] for comfort. Pt transferred sit<>stand with RW max A to position cushion in WC. Pt with strong L lateral lean with standing and LLE knee buckling requiring max verbal cues to correct. Pt performed simulated car transfer without AD +2 assist for trunk control, LE management, and scooting. Pt reported increased back  pain and stated "take me back to the room now". Therapist attempted to encourage participation in OOB mobility, however pt refused. Pt transported back to room in [facility]. Pt transferred stand<>pivot WC<>bed without AD max A. Pt transferred sit<>supine mod A for LE management. Pt required multiple rest breaks throughout session and max verbal encouragement to participate. Pt agreed to bed mobility exercises. In L sidelying pt performed RLE clamshells 2x8 with verbal cues for technique. Pt performed RLE sidelying hip abduction 2x8 with verbal and tactile cues to prevent hip flexor compensation, however pt demonstrated poor carry over. Pt performed RLE hip extension 2x8 with verbal cues for technique. Concluded session with pt supine in bed, needs within reach, and bed alarm on.   Therapy Documentation Precautions:  Precautions Precautions: Fall Precaution Comments: Strong L lateral lean, TLSO for mobility Required Braces or Orthoses: Other Brace(TLSO) Spinal Brace: Thoracolumbosacral orthotic Spinal Brace Comments: for comfort with mobility Restrictions Weight Bearing Restrictions: No  Therapy/Group: Individual Therapy Alfonse Alpers PT, DPT   11/04/2019, 7:37 AM

## 2019-11-04 NOTE — Progress Notes (Signed)
Occupational Therapy Session Note  Patient Details  Name: [name] MRN: [id number] Date of Birth: 01-Feb-1951  Today's Date: 11/04/2019 OT Individual Time: [id number] Individual Time Calculation (min): 47 min  13 minutes missed  Short Term Goals: Week 1:  OT Short Term Goal 1 (Week 1): Pt will perform shower transfer with mod A. OT Short Term Goal 2 (Week 1): Pt will perform toileting with mod A. OT Short Term Goal 3 (Week 1): Pt will don back brace with min A overall. OT Short Term Goal 4 (Week 1): Pt will perform LB dressing with max A overall.  Skilled Therapeutic Interventions/Progress Updates:    Pt greeted in bed, reporting pain but willing to try transferring to w/c. Steady assist for supine<sit with HOB elevated given vcs. Once EOB pt with a bout of nausea. She grabbed her emesis bag and initiated return to bed. Pt refused to attempt sitting up again. Bedlevel UE ther-ex completed using 2# dowel rod x16 reps. During 2nd set of exercises pt with increased pain in her sacrum. Modified to having pt use unweighted ball during ther-ex however pt still with c/o increased pain during exercises. Assisted pt with obtaining sidelying position using pillows to relieve pain. Pt reported sacral pain was better after repositioning.Time missed due to pain.   RN also in during session to provide pt with pain medicine   Therapy Documentation Precautions:  Precautions Precautions: Fall Precaution Comments: Strong L lateral lean, TLSO for mobility Required Braces or Orthoses: Other Brace(TLSO) Spinal Brace: Thoracolumbosacral orthotic Spinal Brace Comments: for comfort with mobility Restrictions Weight Bearing Restrictions: No Vital Signs: Therapy Vitals Temp: 98.9 F (37.2 C) Temp Source: Oral Pulse Rate: 87 Resp: 17 BP: 122/78 Patient Position (if appropriate): Lying Oxygen Therapy SpO2: 98 % O2 Device: Room Air Pain: Pain Assessment Pain Scale: 0-10 Pain Score: 5   Pain Type: Chronic pain Pain Location: Back Pain Orientation: Lower Pain Radiating Towards: "buttbone" Pain Descriptors / Indicators: Aching Pain Frequency: Constant Pain Onset: On-going Pain Intervention(s): Medication (See eMAR) ADL:        Therapy/Group: Individual Therapy  Scout Guyett A Lyfe Monger 11/04/2019, 4:01 PM

## 2019-11-04 NOTE — Progress Notes (Signed)
Occupational Therapy Session Note  Patient Details  Name: [name] MRN: [id number] Date of Birth: 07/27/1951  Today's Date: 11/04/2019 OT Individual Time: 1305-1400 OT Individual Time Calculation (min): 55 min    Short Term Goals: Week 1:  OT Short Term Goal 1 (Week 1): Pt will perform shower transfer with mod A. OT Short Term Goal 2 (Week 1): Pt will perform toileting with mod A. OT Short Term Goal 3 (Week 1): Pt will don back brace with min A overall. OT Short Term Goal 4 (Week 1): Pt will perform LB dressing with max A overall.  Skilled Therapeutic Interventions/Progress Updates:    Pt received in bed and agreeable to trying therapy.  Pt sat to EOB with mod A and min A to support balance as she had a L lean.  Pt suddenly layed back down to her side c/o sharp pain radiating down L hip.  She then sat back up with much encouragement.  To help correct L lean had pt reach R arm out to her side.  She only tolerated sitting 2 minutes then had to lay back down and suddenly vomitted a large amount.  Able to use bag so no clean up necessary. Pt stated she was dizzy.  Helped pt get adjusted in supine with HOB elevated.  She has severely blurry vision L eye.  Worked on tracking and gaze stabilization with R eye.  Her R eye does not track past midline to the L and overall she had great difficulty with eye exercises.  Lue strength improving but impaired coordination. Worked on LUE coordination with dowel bar and isolated AROM exercises.  Pt resting in bed with alarm set and all needs met.   Therapy Documentation Precautions:  Precautions Precautions: Fall Precaution Comments: Strong L lateral lean, TLSO for mobility Required Braces or Orthoses: Other Brace(TLSO) Spinal Brace: Thoracolumbosacral orthotic Spinal Brace Comments: for comfort with mobility Restrictions Weight Bearing Restrictions: No  Vital Signs: Therapy Vitals Temp: 98.9 F (37.2 C) Temp Source: Oral Pulse Rate:  87 Resp: 17 BP: 122/78 Patient Position (if appropriate): Lying Oxygen Therapy SpO2: 98 % O2 Device: Room Air Pain: Pain Assessment Pain Scale: 0-10 Pain Score: 5  Pain Type: Chronic pain Pain Location: Back Pain Orientation: Lower Pain Radiating Towards: "buttbone" Pain Descriptors / Indicators: Aching Pain Frequency: Constant Pain Onset: On-going Pain Intervention(s): Medication (See eMAR)  Therapy/Group: Individual Therapy  [place] 11/04/2019, 4:38 PM

## 2019-11-05 ENCOUNTER — Inpatient Hospital Stay (HOSPITAL_COMMUNITY): Payer: Medicare PPO | Admitting: Occupational Therapy

## 2019-11-05 ENCOUNTER — Inpatient Hospital Stay (HOSPITAL_COMMUNITY): Payer: Medicare PPO

## 2019-11-05 LAB — GLUCOSE, CAPILLARY
Glucose-Capillary: 113 mg/dL — ABNORMAL HIGH (ref 70–99)
Glucose-Capillary: 153 mg/dL — ABNORMAL HIGH (ref 70–99)
Glucose-Capillary: 157 mg/dL — ABNORMAL HIGH (ref 70–99)
Glucose-Capillary: 168 mg/dL — ABNORMAL HIGH (ref 70–99)

## 2019-11-05 LAB — CBC
HCT: 27.7 % — ABNORMAL LOW (ref 36.0–46.0)
Hemoglobin: 8.5 g/dL — ABNORMAL LOW (ref 12.0–15.0)
MCH: 29 pg (ref 26.0–34.0)
MCHC: 30.7 g/dL (ref 30.0–36.0)
MCV: 94.5 fL (ref 80.0–100.0)
Platelets: 192 10*3/uL (ref 150–400)
RBC: 2.93 MIL/uL — ABNORMAL LOW (ref 3.87–5.11)
RDW: 15.9 % — ABNORMAL HIGH (ref 11.5–15.5)
WBC: 7.7 10*3/uL (ref 4.0–10.5)
nRBC: 0.3 % — ABNORMAL HIGH (ref 0.0–0.2)

## 2019-11-05 LAB — PROTIME-INR
INR: 2.8 — ABNORMAL HIGH (ref 0.8–1.2)
Prothrombin Time: 29.8 seconds — ABNORMAL HIGH (ref 11.4–15.2)

## 2019-11-05 MED ORDER — MECLIZINE HCL 25 MG PO TABS
25.0000 mg | ORAL_TABLET | Freq: Three times a day (TID) | ORAL | Status: DC | PRN
  Administered 2019-11-05: 25 mg via ORAL
  Filled 2019-11-05: qty 1

## 2019-11-05 MED ORDER — WARFARIN SODIUM 1 MG PO TABS
1.0000 mg | ORAL_TABLET | Freq: Once | ORAL | Status: AC
  Administered 2019-11-05: 1 mg via ORAL
  Filled 2019-11-05: qty 1

## 2019-11-05 NOTE — Progress Notes (Addendum)
Patient ID: [name], female   DOB: August 18, 1951, 68 y.o.   MRN: [id number]  [facility] Progress Note   Assessment/ Plan:   1.  Recurrent Pseudomonas bacteremia (Likely HD CRB) with evidence of mitral valve endocarditis that is post [facility] exchange for infection focus control.  Seen by cardiothoracic surgery and no imminent plan for surgical intervention-this will be reevaluated as an outpatient following completion of antibiotic therapy with cefepime and ciprofloxacin. 2. ESRD: Continue HD on Monday/Wednesday/Friday dialysis schedule with next HD ordered for tomorrow later in the day. 3. Anemia: Hgb lower without obvious loss (likely significant MIC), redose ESA, no indications for PRBC transfusion.  4. CKD-MBD: Phosphorus level within acceptable range, will continue to monitor on renal diet/binders 5.  Acute CVA with evidence of chronic ischemia/old infarcts: Seen by neurology-suspected embolic infarct from SBE.  Ongoing inpatient rehabilitation for management of neurological deficits. 6. Hypertension:Blood pressure currently at goal status post recent CVA.  Subjective:   Denies any complaints, denies chest pain or shortness of breath.    Objective:   BP 137/83 (BP Location: Left Arm)   Pulse 82   Temp 98.7 F (37.1 C) (Oral)   Resp 16   Ht 5\' 7"  (1.702 m)   Wt 60.4 kg   SpO2 100%   BMI 20.86 kg/m   Physical Exam: Gen: Sleeping comfortably in bed.  CVS: Regular rhythm, normal rate, S1 and S2 normal Resp: Clear to auscultation bilaterally, no audible rales or rhonchi Abd: Soft, flat, nontender Ext: No lower extremity edema.  Right IJ [facility]  Labs: BMET Recent Labs  Lab 10/30/19 0438 10/31/19 0358 11/01/19 0421 11/03/19 1320  NA 135 133* 136 133*  K 4.5 3.8 4.7 4.8  CL 97* 95* 96* 93*  CO2 25 26 27 28   GLUCOSE 175* 166* 158* 254*  BUN 32* 13 31* 38*  CREATININE 5.81* 3.41* 5.59* 6.71*  CALCIUM 8.5* 8.4* 8.9 9.3  PHOS 4.7* 3.3  --  2.8   CBC Recent  Labs  Lab 11/02/19 0712 11/03/19 0612 11/04/19 0546 11/05/19 0528  WBC 9.2 9.0 8.3 7.7  HGB 8.8* 8.2* 8.9* 8.5*  HCT 28.3* 26.7* 28.8* 27.7*  MCV 93.1 92.7 93.8 94.5  PLT 197 182 195 192    Medications:    . acetaminophen  500 mg Oral TID  . calcium carbonate  1 tablet Oral 2 times per day  . carvedilol  6.25 mg Oral BID  . Chlorhexidine Gluconate Cloth  6 each Topical Daily  . ciprofloxacin  500 mg Oral q1800  . doxercalciferol  1.5 mcg Intravenous Q M,W,F-HD  . feeding supplement (ENSURE ENLIVE)  237 mL Oral BID BM  . gabapentin  100 mg Oral QHS  . insulin aspart  0-9 Units Subcutaneous TID WC  . isosorbide mononitrate  30 mg Oral QPC lunch  . multivitamin  1 tablet Oral QPC lunch  . senna-docusate  1 tablet Oral QHS  . Warfarin - Pharmacist Dosing Inpatient   Does not apply [id number]   Elmarie Shiley, MD 11/05/2019, 9:28 AM

## 2019-11-05 NOTE — Progress Notes (Signed)
Occupational Therapy Session Note  Patient Details  Name: [name] MRN: [id number] Date of Birth: 1951/10/01  Today's Date: 11/05/2019 OT Individual Time: 1001-1051 OT Individual Time Calculation (min): 50 min  10 minutes missed.   Short Term Goals: Week 1:  OT Short Term Goal 1 (Week 1): Pt will perform shower transfer with mod A. OT Short Term Goal 2 (Week 1): Pt will perform toileting with mod A. OT Short Term Goal 3 (Week 1): Pt will don back brace with min A overall. OT Short Term Goal 4 (Week 1): Pt will perform LB dressing with max A overall.  Skilled Therapeutic Interventions/Progress Updates:    Pt greeted in bed and verbalized being premedicated for pain. RN verified this. Pt was agreeable to transfer to w/c to complete grooming activities at the sink. Min A for supine<sit. Due to Lt lean, had pt extend Rt arm towards baseboard and increase weightbearing through limb to improve balance. Pt able to attend to this cue for short windows of time and required frequent cuing while OT donned TLSO. Max A for squat pivot<w/c. Pt groaning and yelling out in pain while sitting due to sacral pain while combing hair at the sink. Noted multiple drops of her hairbrush. After a few minutes pt demanded to return to bed. Max A for squat pivot<bed with pt initiating transition to supine before OT removed orthosis. After she was repositioned for pain relief, set pt up to complete oral care, hand washing, and lotion application. Focus at this time was placed on Lt NMR and bilateral hand coordination. Pt once again dropping ADL items multiple times with both hands due to [facility] deficits. Supervision-Min A for tasks overall. She then had a spell of nausea and vomited in the emesis bag. Pt agreeable to work on her hand skills vs terminate session early due to illness. With her consent provided pt with an anti-nausea aromatherapy blend that we used for remainder of tx. Also provided her with soft tan  theraputty and instructed pt through exercises to work on coordination and strength. Pt began falling asleep with putty in her hand and declined further tx due to fatigue. Pt remained in bed with all needs within reach, including emesis bags, bed alarm set, and 4 bedrails up to keep pillows on the bed. Notified RN of n/v. Time missed due to fatigue/illness/pain.   Therapy Documentation Precautions:  Precautions Precautions: Fall Precaution Comments: Strong L lateral lean, TLSO for mobility Required Braces or Orthoses: Other Brace(TLSO) Spinal Brace: Thoracolumbosacral orthotic Spinal Brace Comments: for comfort with mobility Restrictions Weight Bearing Restrictions: No Vital Signs: Therapy Vitals Temp: 98.8 F (37.1 C) Temp Source: Oral Pulse Rate: 84 Resp: 16 BP: (!) 156/82 Patient Position (if appropriate): Lying Oxygen Therapy SpO2: 100 % O2 Device: Room Air ADL:       Therapy/Group: Individual Therapy  Rox Mcgriff A Anesha Hackert 11/05/2019, 4:27 PM

## 2019-11-05 NOTE — Progress Notes (Signed)
ANTICOAGULATION CONSULT NOTE - Follow-Up  Pharmacy Consult for Warfarin Indication: RIJ DVT, CVA  No Known Allergies  Patient Measurements: Height: 5\' 7"  (170.2 cm) Weight: 133 lb 2.5 oz (60.4 kg) IBW/kg (Calculated) : 61.6 Heparin Dosing Weight: 63 kg  Vital Signs: Temp: 98.7 F (37.1 C) (12/13 0519) Temp Source: Oral (12/13 0519) BP: 137/83 (12/13 0519) Pulse Rate: 82 (12/13 0519)  Labs: Recent Labs    11/03/19 0612 11/03/19 1320 11/04/19 0546 11/05/19 0528  HGB 8.2*  --  8.9* 8.5*  HCT 26.7*  --  28.8* 27.7*  PLT 182  --  195 192  LABPROT 27.1*  --  26.1* 29.8*  INR 2.5*  --  2.4* 2.8*  CREATININE  --  6.71*  --   --     Estimated Creatinine Clearance: 7.5 mL/min (A) (by C-G formula based on SCr of 6.71 mg/dL (H)).  Assessment: 68 yr old female with ESRD who presented on 11/27 with new CVA and is now found to have RIJ DVT and pharmacy consulted to dose Warfarin.   Warfarin started 12/4. On Cipro + Cefepime dual coverage for endocarditis x6 weeks - noted drug interaction with Warfarin.   INR today is therapeutic (goal of 2-3). Hgb 8.2, plts wnl. No bleeding noted.   Goal of Therapy:  INR 2-3 Monitor platelets by anticoagulation protocol: Yes   Plan:  - Warfarin 1 mg x 1 dose at 1800 today - Daily PT/INR, CBC q72h - Will continue to monitor for any signs/symptoms of bleeding and will follow up with PT/INR in the a.m.    Thank you for allowing pharmacy to be a part of this patient's care.  Duanne Limerick, PharmD, BCPS Clinical Pharmacist Clinical phone for 11/05/2019: [phone] 11/05/2019 10:08 AM   **Pharmacist phone directory can now be found on [email or web address] ([email or web address]).  Listed under [place].

## 2019-11-05 NOTE — Progress Notes (Signed)
[place] PHYSICAL MEDICINE & REHABILITATION PROGRESS NOTE   Subjective/Complaints: Had a good night. Back is feeling better today. Uses heating pad  ROS: Patient denies fever, rash, sore throat, blurred vision, nausea, vomiting, diarrhea, cough, shortness of breath or chest pain, joint or back pain, headache, or mood change.     Objective:   No results found. Recent Labs    11/04/19 0546 11/05/19 0528  WBC 8.3 7.7  HGB 8.9* 8.5*  HCT 28.8* 27.7*  PLT 195 192   Recent Labs    11/03/19 1320  NA 133*  K 4.8  CL 93*  CO2 28  GLUCOSE 254*  BUN 38*  CREATININE 6.71*  CALCIUM 9.3    Intake/Output Summary (Last 24 hours) at 11/05/2019 1043 Last data filed at 11/05/2019 0805 Gross per 24 hour  Intake 440 ml  Output --  Net 440 ml     Physical Exam: Vital Signs Blood pressure 137/83, pulse 82, temperature 98.7 F (37.1 C), temperature source Oral, resp. rate 16, height 5\' 7"  (1.702 m), weight 60.4 kg, SpO2 100 %.  Constitutional: No distress . Vital signs reviewed. HEENT: EOMI, oral membranes moist Neck: supple Cardiovascular: RRR without murmur. No JVD    Respiratory: CTA Bilaterally without wheezes or rales. Normal effort    GI: BS +, non-tender, non-distended   Ext: no edema Skin: intact M/S: LB with mild ttp Neurological: 4+/5 strength throughout. Sensation intact.  alert  Makes good eye contact with examiner follows full commands.  She provides her name and age.  Fair insight to her deficits.  Psych: pleasant, normal affect   Assessment/Plan: 1. Functional deficits secondary to right basal ganglia stroke which require 3+ hours per day of interdisciplinary therapy in a comprehensive inpatient rehab setting.  Physiatrist is providing close team supervision and 24 hour management of active medical problems listed below.  Physiatrist and rehab team continue to assess barriers to discharge/monitor patient progress toward functional and medical goals  Care  Tool:  Bathing    Body parts bathed by patient: Left arm, Chest, Abdomen, Front perineal area, Right upper leg, Left upper leg, Face   Body parts bathed by helper: Right arm, Buttocks, Right lower leg, Left lower leg     Bathing assist Assist Level: Moderate Assistance - Patient 50 - 74%     Upper Body Dressing/Undressing Upper body dressing   What is the patient wearing?: Pull over shirt    Upper body assist Assist Level: Moderate Assistance - Patient 50 - 74%    Lower Body Dressing/Undressing Lower body dressing      What is the patient wearing?: Pants     Lower body assist Assist for lower body dressing: Moderate Assistance - Patient 50 - 74%     Toileting Toileting    Toileting assist Assist for toileting: Minimal Assistance - Patient > 75%     Transfers Chair/bed transfer  Transfers assist     Chair/bed transfer assist level: Maximal Assistance - Patient 25 - 49%     Locomotion Ambulation   Ambulation assist   Ambulation activity did not occur: Safety/medical concerns(back pain, bilateral LE weakness, poor bilateral knee control)          Walk 10 feet activity   Assist  Walk 10 feet activity did not occur: Safety/medical concerns(back pain, bilateral LE weakness, poor bilateral knee control)        Walk 50 feet activity   Assist Walk 50 feet with 2 turns activity did not occur:  Safety/medical concerns(back pain, bilateral LE weakness, poor bilateral knee control)         Walk 150 feet activity   Assist Walk 150 feet activity did not occur: Safety/medical concerns(back pain, bilateral LE weakness, poor bilateral knee control)         Walk 10 feet on uneven surface  activity   Assist Walk 10 feet on uneven surfaces activity did not occur: Safety/medical concerns(back pain, bilateral LE weakness, poor bilateral knee control)         Wheelchair     Assist Will patient use wheelchair at discharge?: Yes Type of  Wheelchair: Manual Wheelchair activity did not occur: Safety/medical concerns(Pt could not tolerate sitting in WC for long enough to complete activity due to low back pain)  Wheelchair assist level: Minimal Assistance - Patient > 75% Max wheelchair distance: 19ft    Wheelchair 50 feet with 2 turns activity    Assist    Wheelchair 50 feet with 2 turns activity did not occur: Safety/medical concerns(Pt could not tolerate sitting in WC for long enough to complete activity due to low back pain)       Wheelchair 150 feet activity     Assist  Wheelchair 150 feet activity did not occur: Safety/medical concerns(Pt could not tolerate sitting in [facility] for long enough to complete activity due to low back pain)       Blood pressure 137/83, pulse 82, temperature 98.7 F (37.1 C), temperature source Oral, resp. rate 16, height 5\' 7"  (1.702 m), weight 60.4 kg, SpO2 100 %.    Medical Problem List and Plan: 1.  Impaired gait with left side weakness secondary to right basal ganglia infarction as well as small cortical infarct left frontal lobe             -patient may shower             -ELOS/Goals: modI in PT, OT, I in SLP 2.  Antithrombotics: -DVT/anticoagulation: Chronic Coumadin for right IJ acute DVT.  Plan anticoagulation x3 months             -antiplatelet therapy: N/A 3. Pain Management chronic back and left hip pain: Neurontin 100 mg nightly, oxycodone as needed.  TLSO back brace for comfort  12/12 increased back pain     -kpad    -scheduled tylenol  12/13 back pain better today 4. Mood: Provide emotional support             -antipsychotic agents: N/A 5. Neuropsych: This patient is capable of making decisions on her own behalf. 6. Skin/Wound Care: Routine skin checks 7. Fluids/Electrolytes/Nutrition: Routine in and outs with follow-up chemistries 8.  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  New  HD tunneled catheter placed 10/26/2019. 9.  ID/recurrent Pseudomonas bacteremia.  Continue Maxipime and Cipro as directed with infectious disease to discuss duration of antibiotic therapies anticipate 6 weeks. 10.  Hemodialysis MWF.  Follow-up per renal services.   12/10: Cr elevated this morning; appreciate nephrology follow-up 11.  Diabetes mellitus.  Hemoglobin A1c 7.6.  SSI. 12.  Hypertension.  Imdur 30 mg daily, Coreg 6.25 mg twice daily.  Monitor with increased mobility.  12/13 BP stable 13.  Acute on chronic anemia.  Follow-up CBC  12/13 hgb 8.5 14. Constipation:  12/12: No BM since 12/8   12/13 -large bm last night 15. Disposition: Upon discharge, patient should have follow-up with PCP, physiatry, infectious disease, and neurology.    LOS: 4  days A FACE TO FACE EVALUATION WAS PERFORMED  Meredith Staggers 11/05/2019, 10:43 AM

## 2019-11-06 ENCOUNTER — Inpatient Hospital Stay (HOSPITAL_COMMUNITY): Payer: Medicare PPO

## 2019-11-06 ENCOUNTER — Inpatient Hospital Stay (HOSPITAL_COMMUNITY): Payer: Medicare PPO | Admitting: Occupational Therapy

## 2019-11-06 DIAGNOSIS — R7881 Bacteremia: Secondary | ICD-10-CM

## 2019-11-06 DIAGNOSIS — D649 Anemia, unspecified: Secondary | ICD-10-CM

## 2019-11-06 DIAGNOSIS — R4182 Altered mental status, unspecified: Secondary | ICD-10-CM

## 2019-11-06 DIAGNOSIS — G253 Myoclonus: Secondary | ICD-10-CM

## 2019-11-06 DIAGNOSIS — E119 Type 2 diabetes mellitus without complications: Secondary | ICD-10-CM

## 2019-11-06 DIAGNOSIS — R7309 Other abnormal glucose: Secondary | ICD-10-CM

## 2019-11-06 DIAGNOSIS — G9341 Metabolic encephalopathy: Secondary | ICD-10-CM

## 2019-11-06 DIAGNOSIS — K5901 Slow transit constipation: Secondary | ICD-10-CM

## 2019-11-06 DIAGNOSIS — R0989 Other specified symptoms and signs involving the circulatory and respiratory systems: Secondary | ICD-10-CM

## 2019-11-06 LAB — COMPREHENSIVE METABOLIC PANEL
ALT: 12 U/L (ref 0–44)
AST: 18 U/L (ref 15–41)
Albumin: 2.1 g/dL — ABNORMAL LOW (ref 3.5–5.0)
Alkaline Phosphatase: 86 U/L (ref 38–126)
Anion gap: 14 (ref 5–15)
BUN: 48 mg/dL — ABNORMAL HIGH (ref 8–23)
CO2: 24 mmol/L (ref 22–32)
Calcium: 9.3 mg/dL (ref 8.9–10.3)
Chloride: 90 mmol/L — ABNORMAL LOW (ref 98–111)
Creatinine, Ser: 8.97 mg/dL — ABNORMAL HIGH (ref 0.44–1.00)
GFR calc Af Amer: 5 mL/min — ABNORMAL LOW (ref >60)
GFR calc non Af Amer: 4 mL/min — ABNORMAL LOW (ref >60)
Glucose, Bld: 146 mg/dL — ABNORMAL HIGH (ref 70–99)
Potassium: 5.9 mmol/L — ABNORMAL HIGH (ref 3.5–5.1)
Sodium: 128 mmol/L — ABNORMAL LOW (ref 135–145)
Total Bilirubin: 0.2 mg/dL — ABNORMAL LOW (ref 0.3–1.2)
Total Protein: 7.5 g/dL (ref 6.5–8.1)

## 2019-11-06 LAB — CBC
HCT: 26.1 % — ABNORMAL LOW (ref 36.0–46.0)
Hemoglobin: 8.2 g/dL — ABNORMAL LOW (ref 12.0–15.0)
MCH: 29.1 pg (ref 26.0–34.0)
MCHC: 31.4 g/dL (ref 30.0–36.0)
MCV: 92.6 fL (ref 80.0–100.0)
Platelets: 212 10*3/uL (ref 150–400)
RBC: 2.82 MIL/uL — ABNORMAL LOW (ref 3.87–5.11)
RDW: 15.9 % — ABNORMAL HIGH (ref 11.5–15.5)
WBC: 7.1 10*3/uL (ref 4.0–10.5)
nRBC: 0.3 % — ABNORMAL HIGH (ref 0.0–0.2)

## 2019-11-06 LAB — URINALYSIS, ROUTINE W REFLEX MICROSCOPIC
Bilirubin Urine: NEGATIVE
Glucose, UA: 50 mg/dL — AB
Hgb urine dipstick: NEGATIVE
Ketones, ur: NEGATIVE mg/dL
Nitrite: NEGATIVE
Protein, ur: 100 mg/dL — AB
Specific Gravity, Urine: 1.018 (ref 1.005–1.030)
pH: 5 (ref 5.0–8.0)

## 2019-11-06 LAB — IRON AND TIBC
Iron: 36 ug/dL (ref 28–170)
Saturation Ratios: 16 % (ref 10.4–31.8)
TIBC: 224 ug/dL — ABNORMAL LOW (ref 250–450)
UIBC: 188 ug/dL

## 2019-11-06 LAB — GLUCOSE, CAPILLARY
Glucose-Capillary: 117 mg/dL — ABNORMAL HIGH (ref 70–99)
Glucose-Capillary: 125 mg/dL — ABNORMAL HIGH (ref 70–99)
Glucose-Capillary: 157 mg/dL — ABNORMAL HIGH (ref 70–99)
Glucose-Capillary: 127 mg/dL — ABNORMAL HIGH (ref 70–99)
Glucose-Capillary: 161 mg/dL — ABNORMAL HIGH (ref 70–99)

## 2019-11-06 LAB — FERRITIN: Ferritin: 1183 ng/mL — ABNORMAL HIGH (ref 11–307)

## 2019-11-06 LAB — PHOSPHORUS: Phosphorus: 3.9 mg/dL (ref 2.5–4.6)

## 2019-11-06 LAB — AMMONIA: Ammonia: 14 umol/L (ref 9–35)

## 2019-11-06 LAB — PROTIME-INR
INR: 2.9 — ABNORMAL HIGH (ref 0.8–1.2)
Prothrombin Time: 29.9 seconds — ABNORMAL HIGH (ref 11.4–15.2)

## 2019-11-06 MED ORDER — DARBEPOETIN ALFA 60 MCG/0.3ML IJ SOSY
PREFILLED_SYRINGE | INTRAMUSCULAR | Status: AC
  Filled 2019-11-06: qty 0.3

## 2019-11-06 MED ORDER — WARFARIN SODIUM 1 MG PO TABS
1.0000 mg | ORAL_TABLET | Freq: Once | ORAL | Status: AC
  Administered 2019-11-06: 1 mg via ORAL
  Filled 2019-11-06: qty 1

## 2019-11-06 MED ORDER — DOXERCALCIFEROL 4 MCG/2ML IV SOLN
INTRAVENOUS | Status: AC
  Filled 2019-11-06: qty 2

## 2019-11-06 MED ORDER — DARBEPOETIN ALFA 60 MCG/0.3ML IJ SOSY
60.0000 ug | PREFILLED_SYRINGE | INTRAMUSCULAR | Status: DC
  Administered 2019-11-06: 60 ug via INTRAVENOUS
  Filled 2019-11-06: qty 0.3

## 2019-11-06 MED ORDER — HEPARIN SODIUM (PORCINE) 1000 UNIT/ML IJ SOLN
INTRAMUSCULAR | Status: AC
  Administered 2019-11-06: 3200 [IU]
  Filled 2019-11-06: qty 4

## 2019-11-06 NOTE — Progress Notes (Signed)
Upon Dr. Serita Grit entry to the patient's room this AM, patient was noted to have a status change. This RN had been with the patient the previous week and noted she had no facial droop present. This AM she has facial droop, twitching in left extremities, and is minimally verbal with staff. Orders received for 2-view chest x-ray, UA/C&S, and MRI of the Brain. Marlowe Shores, PA-C consulted neuro and received orders for overnight EEG. Patient continued with Hemodialysis treatment. Hemodialysis RN is keeping this RN posted of any changes in patient status.

## 2019-11-06 NOTE — Progress Notes (Signed)
Speech Language Pathology Daily Session Note  Patient Details  Name: Lohgan Faletti MRN: [id number] Date of Birth: [birth year]  Today's Date: 11/06/2019 SLP Individual Time: 0805-0825 SLP Individual Time Calculation (min): 20 min  Short Term Goals: Week 1: SLP Short Term Goal 1 (Week 1): Pt will demonstrate functional problem solving during basic tasks with Min A verbal/visual cues SLP Short Term Goal 2 (Week 1): Pt will demonstrate recall of day to day/new information with Min A verbal cues for use of compensatory memory strategies SLP Short Term Goal 3 (Week 1): Pt will selectively attend to functional tasks with Min A verbal cues for redirection SLP Short Term Goal 4 (Week 1): Pt will demonstrate anticipatory awareness by stating 2 ADLs she will need assistance with upon her return home with Min A verbal/visual cues  Skilled Therapeutic Interventions: Skilled ST services focused on cognitive skills.Pt was asleep upon entering room, but awoke with mod A verbal/tatcile cues. Pt was able orientated to city and month only, noting limited verbal output and verbal preservation. Pt required total A for bed mobility to reposition for possible PO consumption, perseverating on reaching arm up after movement was complete. MD entered room and pt demonstrated ability to follow some basic commands, then fading to no verbal and phstical response. Pt stated " I am choking" SLP and MD provided support and repositioning to aid in respiration. Nurse entered room and confirmed suspect cognitive and physical changes from yesterday. Pt was left in room with MD and Nurse. Pt missed 40 minutes of treatment due to medial hold to investigate possible CVA extension. ST recommends to continue skilled ST services.      Pain Pain Assessment Pain Score: 0-No pain  Therapy/Group: Individual Therapy  [place] Basham  [facility] 11/06/2019, 12:29 PM

## 2019-11-06 NOTE — Progress Notes (Signed)
Noified by son [name], reguesting to speak with Becky in the morning , updated stsatus

## 2019-11-06 NOTE — Progress Notes (Signed)
Patient noted laying in bed at the beginning of the shift. Report given from previous nurse of numbness to patient's left arm & vomiting. No vomiting noted on shift yet, patient was very drowsy during assessment. She was very weak to all extremities, followed commands but kept drifting off to sleep. She stated no pain, but when legs lifted to check heels, she grimaced a little & stated that she felt pain in her hips. She then drifted back off. Medications were taken but she seemed not to have the strength to hold up anything & dropped the cup. No acute distress noted, vitals WNL. Will continue to monitor

## 2019-11-06 NOTE — Progress Notes (Addendum)
Reason for consult: ams  Interim summary: [name] is a 68 year old female with history of end-stage renal disease on dialysis Monday Wednesday Friday, hypertension, hyperlipidemia, diabetes who initially presented to [facility] on 10/20/2019 with complaint of dizziness and left arm weakness, left leg numbness.  She was outside the TPA window.  MRI showed acute right basal ganglia, corona radiata and caudate scattered infarcts in the embolic pattern.  Stroke work-up revealed acute DVT in the right IJ, right brachial vein age indeterminate DVT as well as mitral valve vegetation/endocarditis likely secondary to infected dialysis catheter.  She was started on IV heparin with plan to eventually transition to Coumadin.  Patient was last seen by neurology on 10/26/2019.  On exam that day patient was noted to be awake, alert but lethargic, oriented to time place and people and able to follow simple commands.  She had mild left facial asymmetry,LUE 4/5 with pronator drift, finger grip 4/5. LLE proximal 3/5, knee flexion 4/5, foot DF and PF 4/5.   Neurology was reconsulted today because patient had acute worsening of mental status.  Patient son at bedside states patient was able to converse on phone until yesterday morning however she has slowly been declining overnight and this morning he noticed that she was weak on her right side, was reporting having trouble seeing from the left side of her eye and was not able to speak as she was before.  You also noticed twitching on right side of her arms as well as some mouth movements.    ROS: Unable to obtain due to poor mental status Examination  Vital signs in last 24 hours: Temp:  [97.8 F (36.6 C)-99.1 F (37.3 C)] 99.1 F (37.3 C) (12/14 1311) Pulse Rate:  [88-102] 102 (12/14 1311) Resp:  [16-18] 18 (12/14 1311) BP: (123-165)/(79-93) 155/92 (12/14 1311) SpO2:  [95 %-100 %] 98 % (12/14 1311)  General: lying in bed, not in apparent  distress CVS: pulse-normal rate and rhythm RS: breathing comfortably Extremities: normal   Neuro: MS: Lethargic, opens eyes but does not consistently follow commands CN: pupils equal and reactive, no gaze deviation, nystagmus, unable to assess rest of the cranial nerves secondary to altered mental status Motor: Continuous semirhythmic twitching noted in right shoulder and right upper extremity.  Intermittent twitching noted in left upper extremity.  Movements at times appeared myoclonus-like and were worse when patient was stimulated.  Antigravity strength in bilateral upper extremities, withdraws to noxious stimuli in bilateral lower extremities  Basic Metabolic Panel: Recent Labs  Lab 10/31/19 0358 11/01/19 0421 11/03/19 1320  NA 133* 136 133*  K 3.8 4.7 4.8  CL 95* 96* 93*  CO2 26 27 28   GLUCOSE 166* 158* 254*  BUN 13 31* 38*  CREATININE 3.41* 5.59* 6.71*  CALCIUM 8.4* 8.9 9.3  PHOS 3.3  --  2.8    CBC: Recent Labs  Lab 11/02/19 0712 11/03/19 0612 11/04/19 0546 11/05/19 0528 11/06/19 0706  WBC 9.2 9.0 8.3 7.7 7.1  HGB 8.8* 8.2* 8.9* 8.5* 8.2*  HCT 28.3* 26.7* 28.8* 27.7* 26.1*  MCV 93.1 92.7 93.8 94.5 92.6  PLT 197 182 195 192 212     Coagulation Studies: Recent Labs    11/04/19 0546 11/05/19 0528 11/06/19 0706  LABPROT 26.1* 29.8* 29.9*  INR 2.4* 2.8* 2.9*    Imaging MRI brain without contrast 11/06/2019: Redemonstrated are multiple subacute infarcts within the right corona radiata/basal ganglia and left centrum semiovale. No definitively new infarct is identified.  Redemonstrated  small chronic cortical infarct within the left post central gyrus.  Background of mild generalized parenchymal atrophy and chronic small vessel ischemic disease. Redemonstrated chronic lacunar infarcts within the left thalamus and left cerebellum.   ASSESSMENT AND PLAN: 68 year old female with acute infarcts as described above now with worsening mental  status.  Seizure-like activity Subacute and chronic infarcts Encephalopathy -Patient had dialysis on Friday.  Has had a gradual decline in mental status over the weekend.  MRI brain did not show any new infarcts.  UA showed few bacteria but negative nitrites.  Urine culture pending -Encephalopathy likely secondary to elevated blood pressure (systolic blood pressure between 230-180 while in dialysis), recrudescence of earlier stroke symptoms, toxic metabolic causes with elevated BUN in the setting of end-stage renal disease, hyponatremia, possible seizures versus myoclonus.   Recommendations -We will obtain stat EEG to assess for seizures versus myoclonus -If EEG shows seizure activity, will start valproic acid or Vimpat.  Avoiding Keppra in the setting of renal dysfunction -If EEG does not show seizures, this is most likely myoclonus and will improve as patient receives hemodialysis uremia improves -We will also check ammonia to look for other causes of encephalopathy -Recommend blood pressure management with SBP<180 -If altered mental status persists, we will consider switching cefepime to another antibiotic -Continue seizure precautions  ADDENDUM - EEG showed triphasic waves. Patient was noted to have twitching during the eeg without any change to suggest seizure. This is likely myoclonus in setting of esrd and possible precipitated by cepefime use. Ammonia normal - Consider switching cefepime to another antibiotic if possible - Will avoid AED for now and watch for improvement after HD and potentially cepefime discontinuation.  - continue LTM eeg to assess for any seizure.   Thank you for allowing [name] to participate in the care of this patient.  Neurology will follow.  Please page neuro hospitalist for any further questions after 5 PM.  I have spent a total of 35 minutes with the patient reviewing hospital notes,  test results, labs and examining the patient as well as establishing an  assessment and plan that was discussed personally with the patient and her son at bedside.  > 50% of time was spent in direct patient care.     Monty Mccarrell [name]

## 2019-11-06 NOTE — Progress Notes (Signed)
Patient reportedly more lethargic difficulty in making words she is essentially now nonverbal.  Cranial CT scan 11/05/2019 unremarkable for acute changes.  No signs of hemorrhage.  Patient remains on Coumadin therapy for right IJ acute DVT.  Urinalysis study negative.  MRI of the brain pending.  Neurology consulted for recommendations and any further work-up needed.  All issues discussed with son who is at bedside.

## 2019-11-06 NOTE — Progress Notes (Signed)
LTM EEG hooked up and running - no initial skin breakdown - push button tested - neuro notified. Same leads used from [facility]

## 2019-11-06 NOTE — Progress Notes (Signed)
[facility] Progress Note   Dialysis Orders: [facility]  MWF3.5 hours TDC - mult failed accesses Profile 1 - pressure drops very easily;max is pull 1.1 an hour; do not pull rinseback revaclear 300 dialyzer 400/600 2 K 2.5 Ca EDW 62.5  Epo 1200 U q HD; iron 50 mg/weekly  1.5 mcg hectoral each tx  Assessment/Plan: 1.  Recurrent Pseudomonas bacteremia (Likely HD CRB) with evidence of mitral valve endocarditis that is post [facility] exchange for infection focus control.  Seen by cardiothoracic surgery and no imminent plan for surgical intervention-this will be reevaluated as an outpatient following completion of antibiotic therapy with cefepime and ciprofloxacin. 2. ESRD: MWF - HD planned today - CMP added to pre HD labs  3. Anemia: Hgb l8.9  don't see that he has had any ESAs yet - had 100 ordered for 12/9 but don't think it was given - on low dose Epo prior to admission - plan give first dose at 60 - will start today -also needs Fe studies  - no IV Fe at present 4. CKD-MBD: Phosphorus level within acceptable range, will continue to monitor on renal diet/binders, on VDRA -  5.  Acute CVA with evidence of chronic ischemia/old infarcts: Seen by neurology-suspected embolic infarct from SBE.  Ongoing inpatient rehabilitation for management of neurological deficits. Repeat MRI pending due to acute change in MS since yesterday  6. Hypertension/volume :   Blood pressure currently at goal status post recent CVA. - net UF 2 L Friday - avoid BP drops CXR showed pul vasc convestion,  7. Nutrition - diet liberalized to regular - has supplements/multivits ordered - alb very low no fever  8. MS change since 12/13 - no obvious findings on work up - cultures and MRI pending  Myriam Jacobson, PA-C [place] [phone] 11/06/2019,12:10 PM  LOS: 5 days   Subjective:   Spoke with adult son ([name] - her only Comptroller) and Rehab PA.  Noted change in MS that started yesterday - not  interactive, speaking or eating, facial twitching at times - some involuntary mouth movements No leukocytosis, INR at goal . Head CT neg acute findings - completed head MRI a while ago still pending - may need to be transferred back to a higher level of care.  Objective Vitals:   11/05/19 1920 11/06/19 0428 11/06/19 0834 11/06/19 1020  BP: 123/83 (!) 148/88 (!) 165/79 (!) 145/93  Pulse: 91 88 92 93  Resp: 16 16    Temp: 98.8 F (37.1 C) 97.8 F (36.6 C) 98.4 F (36.9 C) 98.6 F (37 C)  TempSrc: Oral Oral Oral Oral  SpO2: 97% 100% 100% 95%  Weight:      Height:       Physical Exam General: supine in bed breathing easily non responsive but eyes open Heart: RRR Lungs: grossly clear anteriorly Abdomen: soft NT Extremities: no edema Dialysis Access: right IJ TDC Neuro: unable to answer questions, facially twitching at times and involuntarily mouth movements.   Additional Objective Labs: Basic Metabolic Panel: Recent Labs  Lab 10/31/19 0358 11/01/19 0421 11/03/19 1320  NA 133* 136 133*  K 3.8 4.7 4.8  CL 95* 96* 93*  CO2 26 27 28   GLUCOSE 166* 158* 254*  BUN 13 31* 38*  CREATININE 3.41* 5.59* 6.71*  CALCIUM 8.4* 8.9 9.3  PHOS 3.3  --  2.8   Liver Function Tests: Recent Labs  Lab 10/31/19 0358 11/03/19 1320  ALBUMIN 1.6* 1.9*   No  results for input(s): LIPASE, AMYLASE in the last 168 hours. CBC: Recent Labs  Lab 11/02/19 0712 11/03/19 0612 11/04/19 0546 11/05/19 0528 11/06/19 0706  WBC 9.2 9.0 8.3 7.7 7.1  HGB 8.8* 8.2* 8.9* 8.5* 8.2*  HCT 28.3* 26.7* 28.8* 27.7* 26.1*  MCV 93.1 92.7 93.8 94.5 92.6  PLT 197 182 195 192 212   Blood Culture    Component Value Date/Time   SDES BLOOD RIGHT HAND 10/29/2019 2127   SPECREQUEST  10/29/2019 2127    BOTTLES DRAWN AEROBIC ONLY Blood Culture adequate volume   CULT  10/29/2019 2127    NO GROWTH 5 DAYS Performed at [facility] [address] [place], [place] [address]    REPTSTATUS 11/03/2019 FINAL  10/29/2019 2127    Cardiac Enzymes: No results for input(s): CKTOTAL, CKMB, CKMBINDEX, TROPONINI in the last 168 hours. CBG: Recent Labs  Lab 11/05/19 1708 11/05/19 2111 11/06/19 0622 11/06/19 0827 11/06/19 1121  GLUCAP 157* 168* 117* 125* 157*   Iron Studies: No results for input(s): IRON, TIBC, TRANSFERRIN, FERRITIN in the last 72 hours. Lab Results  Component Value Date   INR 2.9 (H) 11/06/2019   INR 2.8 (H) 11/05/2019   INR 2.4 (H) 11/04/2019   Studies/Results: DG Chest 2 View  Result Date: 11/06/2019 CLINICAL DATA:  Difficulty breathing EXAM: CHEST - 2 VIEW COMPARISON:  10/20/2019 FINDINGS: Right-sided dual lumen central venous catheter terminates at the level of the right atrium. Mild cardiomegaly, stable. Calcified aorta. Mild pulmonary vascular congestion. No focal airspace consolidation, pleural effusion, or pneumothorax. IMPRESSION: Mild cardiomegaly and pulmonary vascular congestion. Electronically Signed   By: Davina Poke M.D.   On: 11/06/2019 09:55   CT HEAD WO CONTRAST  Result Date: 11/05/2019 CLINICAL DATA:  Stroke, AMS EXAM: CT HEAD WITHOUT CONTRAST TECHNIQUE: Contiguous axial images were obtained from the base of the skull through the vertex without intravenous contrast. COMPARISON:  10/27/2019, MR brain, 10/20/2019 FINDINGS: Brain: Decreased edema of a right corona radiata infarction (series 3, image 23). Right basal ganglia infarctions are not well appreciated. Unchanged, subtle left frontal white matter infarction (series 3, image 25) Vascular: No hyperdense vessel or unexpected calcification. Skull: Normal. Negative for fracture or focal lesion. Sinuses/Orbits: No acute finding. Other: None. IMPRESSION: Decreased edema of a subacute right corona radiata infarction. Otherwise unchanged noncontrast examination of the brain. Electronically Signed   By: Eddie Candle M.D.   On: 11/05/2019 18:55   Medications: . ceFEPime (MAXIPIME) IV 1 g (11/05/19 2036)   .  acetaminophen  500 mg Oral TID  . calcium carbonate  1 tablet Oral 2 times per day  . carvedilol  6.25 mg Oral BID  . Chlorhexidine Gluconate Cloth  6 each Topical Daily  . ciprofloxacin  500 mg Oral q1800  . doxercalciferol  1.5 mcg Intravenous Q M,W,F-HD  . feeding supplement (ENSURE ENLIVE)  237 mL Oral BID BM  . gabapentin  100 mg Oral QHS  . insulin aspart  0-9 Units Subcutaneous TID WC  . isosorbide mononitrate  30 mg Oral QPC lunch  . multivitamin  1 tablet Oral QPC lunch  . senna-docusate  1 tablet Oral QHS  . Warfarin - Pharmacist Dosing Inpatient   Does not apply [phone]

## 2019-11-06 NOTE — Progress Notes (Signed)
Social Work Patient ID: [name], female   DOB: May 18, 1951, 68 y.o.   MRN: [id number]  Met with son who is ot happy regarding pt's decline. He wants to talk with PA or MD. Have texted both of them to talk with son. Stacy-RN aware.

## 2019-11-06 NOTE — Progress Notes (Signed)
ANTICOAGULATION CONSULT NOTE - Follow-Up  Pharmacy Consult for Warfarin Indication: RIJ DVT, CVA  No Known Allergies  Patient Measurements: Height: 5\' 7"  (170.2 cm) Weight: 133 lb 2.5 oz (60.4 kg) IBW/kg (Calculated) : 61.6 Heparin Dosing Weight: 63 kg  Vital Signs: Temp: 98.6 F (37 C) (12/14 1020) Temp Source: Oral (12/14 1020) BP: 145/93 (12/14 1020) Pulse Rate: 93 (12/14 1020)  Labs: Recent Labs    11/03/19 1320 11/04/19 0546 11/05/19 0528 11/06/19 0706  HGB  --  8.9* 8.5* 8.2*  HCT  --  28.8* 27.7* 26.1*  PLT  --  195 192 212  LABPROT  --  26.1* 29.8* 29.9*  INR  --  2.4* 2.8* 2.9*  CREATININE 6.71*  --   --   --     Estimated Creatinine Clearance: 7.5 mL/min (A) (by C-G formula based on SCr of 6.71 mg/dL (H)).  Assessment: 68 yr old female with ESRD who presented on 11/27 with new CVA and is now found to have RIJ DVT and pharmacy consulted to dose Warfarin.   Warfarin started 12/4. On Cipro + Cefepime dual coverage for endocarditis x6 weeks - noted drug interaction with Warfarin.   INR today is therapeutic at 2.9. Hgb 8.2, plts wnl. No bleeding noted.   Goal of Therapy:  INR 2-3 Monitor platelets by anticoagulation protocol: Yes   Plan:  - Repeat warfarin 1 mg PO tonight - Daily PT/INR, CBC q72h - Will continue to monitor for any signs/symptoms of bleeding and will follow up with PT/INR in the a.m.    Thank you for involving pharmacy in this patient's care.  Renold Genta, PharmD, BCPS Clinical Pharmacist Clinical phone for 11/06/2019 until 3p is [phone] 11/06/2019 12:27 PM  **Pharmacist phone directory can be found on [email or web address] listed under [place]**

## 2019-11-06 NOTE — Procedures (Signed)
Patient Name: [name]  MRN: [id number]  Epilepsy Attending: Lora Havens  Referring Physician/Provider: Dr Zeb Comfort Date: 11/06/2019 Duration: 21.47 mins  Patient history: 68yo F with subacute strokes, no with ams and twitching. EEG to evaluate for seizure  Level of alertness: lethargic/awake  AEDs during EEG study: None  Technical aspects: This EEG study was done with scalp electrodes positioned according to the 10-20 International system of electrode placement. Electrical activity was acquired at a sampling rate of 500Hz  and reviewed with a high frequency filter of 70Hz  and a low frequency filter of 1Hz . EEG data were recorded continuously and digitally stored.   DESCRIPTION: EEG showed continuous generalized 3-5hz  theta-delta slowing. Triphasic waves, generalized, maximal bifrontal, at 1.5-2hz  were also noted. Hyperventilation and photic stimulation were not performed.  Patient was noted to have diffuse twitching, pronounced when patient was stimulated without eeg change.  ABNORMALITY - Triphasic waves, generalized - Continuous slow, generalized  IMPRESSION: This study is suggestive of moderate to severe diffuse encephalopathy, non specific to etiology but could be secondary to toxic-metabolic causes, cefepime toxicity. No seizures or definite epileptiform discharges were seen throughout the recording.  Patient was noted to have diffuse jerking, more pronounces when patient was stimulated without concomitant eeg change. Therefore, this is likely myoclonus in setting of uremia, cefepime use.    [name]

## 2019-11-06 NOTE — Progress Notes (Signed)
[place] PHYSICAL MEDICINE & REHABILITATION PROGRESS NOTE   Subjective/Complaints: Patient seen sitting up in bed this AM, with left lean.  She did not sleep well overnight per report and required head CT. She is working with therapies.  Initially able to follow some commands, but later unable to follow commands, saying "I'm choking", but breathing appropriately.  Discussed with nursing as well, who notes difference from later week.   ROS: Limited due to cognition/language.  Objective:   DG Chest 2 View  Result Date: 11/06/2019 CLINICAL DATA:  Difficulty breathing EXAM: CHEST - 2 VIEW COMPARISON:  10/20/2019 FINDINGS: Right-sided dual lumen central venous catheter terminates at the level of the right atrium. Mild cardiomegaly, stable. Calcified aorta. Mild pulmonary vascular congestion. No focal airspace consolidation, pleural effusion, or pneumothorax. IMPRESSION: Mild cardiomegaly and pulmonary vascular congestion. Electronically Signed   By: Davina Poke M.D.   On: 11/06/2019 09:55   CT HEAD WO CONTRAST  Result Date: 11/05/2019 CLINICAL DATA:  Stroke, AMS EXAM: CT HEAD WITHOUT CONTRAST TECHNIQUE: Contiguous axial images were obtained from the base of the skull through the vertex without intravenous contrast. COMPARISON:  10/27/2019, MR brain, 10/20/2019 FINDINGS: Brain: Decreased edema of a right corona radiata infarction (series 3, image 23). Right basal ganglia infarctions are not well appreciated. Unchanged, subtle left frontal white matter infarction (series 3, image 25) Vascular: No hyperdense vessel or unexpected calcification. Skull: Normal. Negative for fracture or focal lesion. Sinuses/Orbits: No acute finding. Other: None. IMPRESSION: Decreased edema of a subacute right corona radiata infarction. Otherwise unchanged noncontrast examination of the brain. Electronically Signed   By: Eddie Candle M.D.   On: 11/05/2019 18:55   Recent Labs    11/05/19 0528 11/06/19 0706  WBC  7.7 7.1  HGB 8.5* 8.2*  HCT 27.7* 26.1*  PLT 192 212   No results for input(s): NA, K, CL, CO2, GLUCOSE, BUN, CREATININE, CALCIUM in the last 72 hours.  Intake/Output Summary (Last 24 hours) at 11/06/2019 1431 Last data filed at 11/06/2019 0730 Gross per 24 hour  Intake 140 ml  Output -  Net 140 ml     Physical Exam: Vital Signs Blood pressure (!) 155/92, pulse (!) 102, temperature 99.1 F (37.3 C), resp. rate 18, height 5\' 7"  (1.702 m), weight 60.4 kg, SpO2 98 %. Constitutional: No distress . Vital signs reviewed. HENT: Normocephalic.  Atraumatic. Eyes: EOMI. No discharge. Cardiovascular: No JVD. Respiratory: Normal effort.  No stridor. GI: Non-distended. Skin: Warm and dry.  Intact. Psych: Distracted. Musc: No edema in extremities.  No tenderness in extremities. Neurological: Alert Left lean Left facial weakness Appears to have expressive greater than receptive aphasia Not following commands for MMT.  Assessment/Plan: 1. Functional deficits secondary to right basal ganglia stroke which require 3+ hours per day of interdisciplinary therapy in a comprehensive inpatient rehab setting.  Physiatrist is providing close team supervision and 24 hour management of active medical problems listed below.  Physiatrist and rehab team continue to assess barriers to discharge/monitor patient progress toward functional and medical goals  Care Tool:  Bathing    Body parts bathed by patient: Left arm, Chest, Abdomen, Front perineal area, Right upper leg, Left upper leg, Face   Body parts bathed by helper: Right arm, Buttocks, Right lower leg, Left lower leg     Bathing assist Assist Level: Moderate Assistance - Patient 50 - 74%     Upper Body Dressing/Undressing Upper body dressing   What is the patient wearing?: Pull over shirt  Upper body assist Assist Level: Moderate Assistance - Patient 50 - 74%    Lower Body Dressing/Undressing Lower body dressing      What is the  patient wearing?: Pants     Lower body assist Assist for lower body dressing: Moderate Assistance - Patient 50 - 74%     Toileting Toileting    Toileting assist Assist for toileting: Minimal Assistance - Patient > 75%     Transfers Chair/bed transfer  Transfers assist     Chair/bed transfer assist level: Maximal Assistance - Patient 25 - 49%     Locomotion Ambulation   Ambulation assist   Ambulation activity did not occur: Safety/medical concerns(back pain, bilateral LE weakness, poor bilateral knee control)          Walk 10 feet activity   Assist  Walk 10 feet activity did not occur: Safety/medical concerns(back pain, bilateral LE weakness, poor bilateral knee control)        Walk 50 feet activity   Assist Walk 50 feet with 2 turns activity did not occur: Safety/medical concerns(back pain, bilateral LE weakness, poor bilateral knee control)         Walk 150 feet activity   Assist Walk 150 feet activity did not occur: Safety/medical concerns(back pain, bilateral LE weakness, poor bilateral knee control)         Walk 10 feet on uneven surface  activity   Assist Walk 10 feet on uneven surfaces activity did not occur: Safety/medical concerns(back pain, bilateral LE weakness, poor bilateral knee control)         Wheelchair     Assist Will patient use wheelchair at discharge?: Yes Type of Wheelchair: Manual Wheelchair activity did not occur: Safety/medical concerns(Pt could not tolerate sitting in WC for long enough to complete activity due to low back pain)  Wheelchair assist level: Minimal Assistance - Patient > 75% Max wheelchair distance: 19ft    Wheelchair 50 feet with 2 turns activity    Assist    Wheelchair 50 feet with 2 turns activity did not occur: Safety/medical concerns(Pt could not tolerate sitting in WC for long enough to complete activity due to low back pain)       Wheelchair 150 feet activity     Assist   Wheelchair 150 feet activity did not occur: Safety/medical concerns(Pt could not tolerate sitting in WC for long enough to complete activity due to low back pain)       Blood pressure (!) 155/92, pulse (!) 102, temperature 99.1 F (37.3 C), resp. rate 18, height 5\' 7"  (1.702 m), weight 60.4 kg, SpO2 98 %.    Medical Problem List and Plan: 1.  Impaired gait with left side weakness secondary to right basal ganglia infarction as well as small cortical infarct left frontal lobe  Will hold CIR today due to change in neurological status  CT head on 12/13 personally reviewed, showing some improvement  Due to change in neurological status UA/urine culture, chest x-ray ordered.  MRI ordered as well.  Discussed with neurology. 2.  Antithrombotics: -DVT/anticoagulation: Chronic Coumadin for right IJ acute DVT.  Plan anticoagulation x3 months             -antiplatelet therapy: N/A 3. Pain Management chronic back and left hip pain: Neurontin 100 mg nightly, oxycodone as needed.  TLSO back brace for comfort 4. Mood: Provide emotional support             -antipsychotic agents: N/A 5. Neuropsych: This patient is not capable  of making decisions on her own behalf. 6. Skin/Wound Care: Routine skin checks 7. Fluids/Electrolytes/Nutrition: Routine in and outs 8.  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  New HD tunneled catheter placed 10/26/2019. 9.  ID/recurrent Pseudomonas bacteremia.    Continue Maxipime and Cipro as directed with infectious disease to discuss duration of antibiotic therapies anticipate 6 weeks. 10.  Hemodialysis MWF.  Follow-up per renal services.  11.  Diabetes mellitus.  Hemoglobin A1c 7.6.  SSI.  Labile on 12/14 12.  Hypertension.  Imdur 30 mg daily, Coreg 6.25 mg twice daily.  Monitor with increased mobility.  Extremely labile on 12/14 13.  Acute on chronic anemia.    Hemoglobin 8.2 on  12/14  Continue to monitor 14. Constipation:  Improving  LOS: 5 days A FACE TO FACE EVALUATION WAS PERFORMED  Ankit Lorie Phenix 11/06/2019, 2:31 PM

## 2019-11-06 NOTE — Progress Notes (Signed)
Pharmacy Antibiotic Note  [name] is a 68 y.o. female admitted on 11/01/2019 with Pseudomonas bacteremia and MV IE.  Pharmacy has been consulted for Cefepime dosing.  The patient is ESRD-MWF. Noted ID plans for dual Cefepime + Cipro thru 12/06/19. Will keep Cefepime daily dosing with in CIR and plan to switch to dosing with HD upon discharge.   Plan: - Continue Cefepime 1g IV every 24 hours - Continue Cipro 500 mg PO every 24 hours - Antibiotics thru 12/06/19 per ID - Will continue to follow HD schedule/duration  Height: 5\' 7"  (170.2 cm) Weight: 133 lb 2.5 oz (60.4 kg) IBW/kg (Calculated) : 61.6  Temp (24hrs), Avg:98.5 F (36.9 C), Min:97.8 F (36.6 C), Max:98.8 F (37.1 C)  Recent Labs  Lab 10/31/19 0358 11/01/19 0421 11/02/19 0712 11/03/19 0612 11/03/19 1320 11/04/19 0546 11/05/19 0528 11/06/19 0706  WBC 10.9* 8.7 9.2 9.0  --  8.3 7.7 7.1  CREATININE 3.41* 5.59*  --   --  6.71*  --   --   --     Estimated Creatinine Clearance: 7.5 mL/min (A) (by C-G formula based on SCr of 6.71 mg/dL (H)).    No Known Allergies  Antimicrobials this admission: Cefepime 11/30 >> Cipro 12/3 >>  Microbiology results: 11/27 COVID >> neg 11/29 BCx >> pseudomonas 4/8 (pan sens) 12/1 Bcx>>neg 12/6 BCx: neg 12/14 UCx:   Thank you for involving pharmacy in this patient's care.  Renold Genta, PharmD, BCPS Clinical Pharmacist Clinical phone for 11/06/2019 until 3p is [phone] 11/06/2019 12:30 PM  **Pharmacist phone directory can be found on [email or web address] listed under [facility]**

## 2019-11-06 NOTE — Consult Note (Signed)
Reason for consult: ams Requesting provider: Dr Delice Lesch  HPI: Mrs. [name] is a 68 year old female with history of end-stage renal disease on dialysis Monday Wednesday Friday, hypertension, hyperlipidemia, diabetes who initially presented to [facility] on 10/20/2019 with complaint of dizziness and left arm weakness, left leg numbness.  She was outside the TPA window.  MRI showed acute right basal ganglia, corona radiata and caudate scattered infarcts in the embolic pattern.  Stroke work-up revealed acute DVT in the right IJ, right brachial vein age indeterminate DVT as well as mitral valve vegetation/endocarditis likely secondary to infected dialysis catheter.  She was started on IV heparin with plan to eventually transition to Coumadin.  Patient was last seen by neurology on 10/26/2019.  On exam that day patient was noted to be awake, alert but lethargic, oriented to time place and people and able to follow simple commands.  She had mild left facial asymmetry,LUE 4/5 with pronator drift, finger grip 4/5. LLE proximal 3/5, knee flexion 4/5, foot DF and PF 4/5.   Neurology was consulted today because patient had acute worsening of mental status.  Patient son at bedside states patient was able to converse on phone until yesterday morning however she has slowly been declining overnight and this morning he noticed that she was weak on her right side, was reporting having trouble seeing from the left side of her eye and was not able to speak as she was before.  You also noticed twitching on right side of her arms as well as some mouth movements.    ROS: Unable to obtain due to poor mental status Examination  Vital signs in last 24 hours: Temp:  [97.8 F (36.6 C)-99.1 F (37.3 C)] 99.1 F (37.3 C) (12/14 1311) Pulse Rate:  [88-102] 102 (12/14 1311) Resp:  [16-18] 18 (12/14 1311) BP: (123-165)/(79-93) 155/92 (12/14 1311) SpO2:  [95 %-100 %] 98 % (12/14 1311)  General: lying in bed, not  in apparent distress CVS: pulse-normal rate and rhythm RS: breathing comfortably Extremities: normal   Neuro: MS: Lethargic, opens eyes but does not consistently follow commands CN: pupils equal and reactive, no gaze deviation, nystagmus, unable to assess rest of the cranial nerves secondary to altered mental status Motor: Continuous semirhythmic twitching noted in right shoulder and right upper extremity.  Intermittent twitching noted in left upper extremity.  Movements at times appeared myoclonus-like and were worse when patient was stimulated.  Antigravity strength in bilateral upper extremities, withdraws to noxious stimuli in bilateral lower extremities  Basic Metabolic Panel: Recent Labs  Lab 10/31/19 0358 11/01/19 0421 11/03/19 1320  NA 133* 136 133*  K 3.8 4.7 4.8  CL 95* 96* 93*  CO2 26 27 28   GLUCOSE 166* 158* 254*  BUN 13 31* 38*  CREATININE 3.41* 5.59* 6.71*  CALCIUM 8.4* 8.9 9.3  PHOS 3.3  --  2.8    CBC: Recent Labs  Lab 11/02/19 0712 11/03/19 0612 11/04/19 0546 11/05/19 0528 11/06/19 0706  WBC 9.2 9.0 8.3 7.7 7.1  HGB 8.8* 8.2* 8.9* 8.5* 8.2*  HCT 28.3* 26.7* 28.8* 27.7* 26.1*  MCV 93.1 92.7 93.8 94.5 92.6  PLT 197 182 195 192 212     Coagulation Studies: Recent Labs    11/04/19 0546 11/05/19 0528 11/06/19 0706  LABPROT 26.1* 29.8* 29.9*  INR 2.4* 2.8* 2.9*    Imaging MRI brain without contrast 11/06/2019: Redemonstrated are multiple subacute infarcts within the right corona radiata/basal ganglia and left centrum semiovale. No definitively new infarct  is identified.  Redemonstrated small chronic cortical infarct within the left post central gyrus.  Background of mild generalized parenchymal atrophy and chronic small vessel ischemic disease. Redemonstrated chronic lacunar infarcts within the left thalamus and left cerebellum.   ASSESSMENT AND PLAN: 68 year old female with acute infarcts as described above now with worsening mental  status.  Seizure-like activity Subacute and chronic infarcts Encephalopathy -Patient had dialysis on Friday.  Has had a gradual decline in mental status over the weekend.  MRI brain did not show any new infarcts.  UA showed few bacteria but negative nitrites.  Urine culture pending -Encephalopathy likely secondary to elevated blood pressure (systolic blood pressure between 230-180 while in dialysis), recrudescence of earlier stroke symptoms, toxic metabolic causes with elevated BUN in the setting of end-stage renal disease, hyponatremia, possible seizures versus myoclonus.   Recommendations -We will obtain stat EEG to assess for seizures versus myoclonus -If EEG shows seizure activity, will start valproic acid or Vimpat.  Avoiding Keppra in the setting of renal dysfunction -If EEG does not show seizures, this is most likely myoclonus and will improve as patient receives hemodialysis uremia improves -We will also check ammonia to look for other causes of encephalopathy -Recommend blood pressure management with SBP<180 -If altered mental status persists, we will consider switching cefepime to another antibiotic -Continue seizure precautions  ADDENDUM - EEG showed triphasic waves. Patient was noted to have twitching during the eeg without any change to suggest seizure. This is likely myoclonus in setting of esrd and possible precipitated by cepefime use. Ammonia normal - Consider switching cefepime to another antibiotic if possible - Will avoid AED for now and watch for improvement after HD and potentially cepefime discontinuation.  - continue LTM eeg to assess for any seizure.   Thank you for allowing [name] to participate in the care of this patient.  Neurology will follow.  Please page neuro hospitalist for any further questions after 5 PM.   [name] [name]

## 2019-11-06 NOTE — Progress Notes (Signed)
Occupational Therapy Note  Patient Details  Name: [name] MRN: [id number] Date of Birth: 18-Sep-1951  Today's Date: 11/06/2019 OT Missed Time: 10 Minutes Missed Time Reason: MD hold (comment)  Pt missed 60 mins scheduled OT treatment due to hold on therapy secondary to possibility of extension of stroke.  Spoke with RN concerning pt condition and changes in status.  Await medical stability/clearance to resume therapies.   [name] 11/06/2019, 11:47 AM

## 2019-11-06 NOTE — Progress Notes (Signed)
Physical Therapy Session Note  Patient Details  Name: Jomaira Demian MRN: [id number] Date of Birth: 04/26/1951  Today's Date: 11/06/2019 PT Individual Time:     Short Term Goals: Week 1:  PT Short Term Goal 1 (Week 1): pt will transfer supine<>sit and sit<>supine min A PT Short Term Goal 2 (Week 1): Pt will transfer stand<>pivot with LRAD mod A PT Short Term Goal 3 (Week 1): Pt will perform car transfer with LRAD mod A  Skilled Therapeutic Interventions/Progress Updates:   Received pt supine in bed, pt with increased facial drooping, and increased difficulty communicating. RN present stating new therapy hold due to increased facial drooping and possibility for extension. 75 minutes of missed skilled physical therapy.   Therapy Documentation Precautions:  Precautions Precautions: Fall Precaution Comments: Strong L lateral lean, TLSO for mobility Required Braces or Orthoses: Other Brace(TLSO) Spinal Brace: Thoracolumbosacral orthotic Spinal Brace Comments: for comfort with mobility Restrictions Weight Bearing Restrictions: No   Therapy/Group: Individual Therapy Alfonse Alpers PT, DPT  11/06/2019, 7:32 AM

## 2019-11-06 NOTE — Progress Notes (Signed)
STAT EEG complete - results pending. Same leads will be used for LTM

## 2019-11-07 ENCOUNTER — Inpatient Hospital Stay (HOSPITAL_COMMUNITY): Payer: Medicare PPO | Admitting: Occupational Therapy

## 2019-11-07 ENCOUNTER — Inpatient Hospital Stay (HOSPITAL_COMMUNITY): Payer: Medicare PPO | Admitting: Speech Pathology

## 2019-11-07 ENCOUNTER — Inpatient Hospital Stay (HOSPITAL_COMMUNITY): Payer: Medicare PPO

## 2019-11-07 DIAGNOSIS — B965 Pseudomonas (aeruginosa) (mallei) (pseudomallei) as the cause of diseases classified elsewhere: Secondary | ICD-10-CM

## 2019-11-07 DIAGNOSIS — I330 Acute and subacute infective endocarditis: Secondary | ICD-10-CM

## 2019-11-07 DIAGNOSIS — I76 Septic arterial embolism: Secondary | ICD-10-CM

## 2019-11-07 DIAGNOSIS — I058 Other rheumatic mitral valve diseases: Secondary | ICD-10-CM

## 2019-11-07 DIAGNOSIS — R64 Cachexia: Secondary | ICD-10-CM

## 2019-11-07 DIAGNOSIS — I059 Rheumatic mitral valve disease, unspecified: Secondary | ICD-10-CM

## 2019-11-07 DIAGNOSIS — R569 Unspecified convulsions: Secondary | ICD-10-CM

## 2019-11-07 DIAGNOSIS — G8190 Hemiplegia, unspecified affecting unspecified side: Secondary | ICD-10-CM

## 2019-11-07 LAB — IRON AND TIBC
Iron: 36 ug/dL (ref 28–170)
Saturation Ratios: 16 % (ref 10.4–31.8)
TIBC: 227 ug/dL — ABNORMAL LOW (ref 250–450)
UIBC: 191 ug/dL

## 2019-11-07 LAB — FERRITIN: Ferritin: 605 ng/mL — ABNORMAL HIGH (ref 11–307)

## 2019-11-07 LAB — RENAL FUNCTION PANEL
Albumin: 2 g/dL — ABNORMAL LOW (ref 3.5–5.0)
Anion gap: 10 (ref 5–15)
BUN: 23 mg/dL (ref 8–23)
CO2: 26 mmol/L (ref 22–32)
Calcium: 8.7 mg/dL — ABNORMAL LOW (ref 8.9–10.3)
Chloride: 93 mmol/L — ABNORMAL LOW (ref 98–111)
Creatinine, Ser: 5.23 mg/dL — ABNORMAL HIGH (ref 0.44–1.00)
GFR calc Af Amer: 9 mL/min — ABNORMAL LOW (ref >60)
GFR calc non Af Amer: 8 mL/min — ABNORMAL LOW (ref >60)
Glucose, Bld: 128 mg/dL — ABNORMAL HIGH (ref 70–99)
Phosphorus: 3.5 mg/dL (ref 2.5–4.6)
Potassium: 4.7 mmol/L (ref 3.5–5.1)
Sodium: 129 mmol/L — ABNORMAL LOW (ref 135–145)

## 2019-11-07 LAB — CBC
HCT: 30.2 % — ABNORMAL LOW (ref 36.0–46.0)
Hemoglobin: 9.5 g/dL — ABNORMAL LOW (ref 12.0–15.0)
MCH: 29.1 pg (ref 26.0–34.0)
MCHC: 31.5 g/dL (ref 30.0–36.0)
MCV: 92.4 fL (ref 80.0–100.0)
Platelets: 221 10*3/uL (ref 150–400)
RBC: 3.27 MIL/uL — ABNORMAL LOW (ref 3.87–5.11)
RDW: 15.9 % — ABNORMAL HIGH (ref 11.5–15.5)
WBC: 9.1 10*3/uL (ref 4.0–10.5)
nRBC: 0.3 % — ABNORMAL HIGH (ref 0.0–0.2)

## 2019-11-07 LAB — GLUCOSE, CAPILLARY
Glucose-Capillary: 141 mg/dL — ABNORMAL HIGH (ref 70–99)
Glucose-Capillary: 161 mg/dL — ABNORMAL HIGH (ref 70–99)
Glucose-Capillary: 182 mg/dL — ABNORMAL HIGH (ref 70–99)
Glucose-Capillary: 215 mg/dL — ABNORMAL HIGH (ref 70–99)

## 2019-11-07 LAB — URINE CULTURE: Culture: 80000 — AB

## 2019-11-07 LAB — PROTIME-INR
INR: 2.8 — ABNORMAL HIGH (ref 0.8–1.2)
Prothrombin Time: 29 seconds — ABNORMAL HIGH (ref 11.4–15.2)

## 2019-11-07 MED ORDER — WARFARIN SODIUM 2 MG PO TABS
2.0000 mg | ORAL_TABLET | Freq: Once | ORAL | Status: AC
  Administered 2019-11-07: 18:00:00 2 mg via ORAL
  Filled 2019-11-07: qty 1

## 2019-11-07 MED ORDER — CHLORHEXIDINE GLUCONATE CLOTH 2 % EX PADS
6.0000 | MEDICATED_PAD | Freq: Two times a day (BID) | CUTANEOUS | Status: DC
  Administered 2019-11-07 – 2019-11-10 (×5): 6 via TOPICAL

## 2019-11-07 MED ORDER — CARVEDILOL 12.5 MG PO TABS
12.5000 mg | ORAL_TABLET | Freq: Two times a day (BID) | ORAL | Status: DC
  Administered 2019-11-07 – 2019-12-05 (×47): 12.5 mg via ORAL
  Filled 2019-11-07 (×53): qty 1

## 2019-11-07 MED ORDER — CHLORHEXIDINE GLUCONATE CLOTH 2 % EX PADS
6.0000 | MEDICATED_PAD | Freq: Every day | CUTANEOUS | Status: DC
  Administered 2019-11-08 – 2019-11-10 (×2): 6 via TOPICAL

## 2019-11-07 NOTE — Progress Notes (Signed)
Social Work Patient ID: [name], female   DOB: [birth year], 68 y.o.   MRN: [id number] Sandra-RN sent pt's son wants her to transfer to an acute unit does not fel she is ready for rehab. Have let MD know and he will call son to answer and address his concerns.

## 2019-11-07 NOTE — Progress Notes (Signed)
Subjective: No new complaints   Antibiotics:  Anti-infectives (From admission, onward)   Start     Dose/Rate Route Frequency Ordered Stop   11/01/19 2000  ceFEPIme (MAXIPIME) 1 g in sodium chloride 0.9 % 100 mL IVPB     1 g 200 mL/hr over 30 Minutes Intravenous Every 24 hours 11/01/19 1715     11/01/19 1800  ciprofloxacin (CIPRO) tablet 500 mg  Status:  Discontinued     500 mg Oral Daily-1800 11/01/19 1715 11/07/19 0914      Medications: Scheduled Meds: . acetaminophen  500 mg Oral TID  . calcium carbonate  1 tablet Oral 2 times per day  . carvedilol  12.5 mg Oral BID  . Chlorhexidine Gluconate Cloth  6 each Topical BID  . [START ON 11/08/2019] Chlorhexidine Gluconate Cloth  6 each Topical Q0600  . darbepoetin (ARANESP) injection - DIALYSIS  60 mcg Intravenous Q Mon-HD  . doxercalciferol  1.5 mcg Intravenous Q M,W,F-HD  . feeding supplement (ENSURE ENLIVE)  237 mL Oral BID BM  . insulin aspart  0-9 Units Subcutaneous TID WC  . isosorbide mononitrate  30 mg Oral QPC lunch  . multivitamin  1 tablet Oral QPC lunch  . senna-docusate  1 tablet Oral QHS  . warfarin  2 mg Oral ONCE-1800  . Warfarin - Pharmacist Dosing Inpatient   Does not apply q1800   Continuous Infusions: . ceFEPime (MAXIPIME) IV 1 g (11/06/19 2041)   PRN Meds:.acetaminophen **OR** acetaminophen (TYLENOL) oral liquid 160 mg/5 mL **OR** acetaminophen, fluticasone, meclizine, oxyCODONE, sorbitol    Objective: Weight change:   Intake/Output Summary (Last 24 hours) at 11/07/2019 1304 Last data filed at 11/07/2019 1259 Gross per 24 hour  Intake 440 ml  Output 1006 ml  Net -566 ml   Blood pressure (!) 168/96, pulse (!) 104, temperature 98.4 F (36.9 C), temperature source Oral, resp. rate 18, height 5\' 7"  (1.702 m), weight 61.7 kg, SpO2 100 %. Temp:  [96.8 F (36 C)-99.4 F (37.4 C)] 98.4 F (36.9 C) (12/15 0445) Pulse Rate:  [98-120] 104 (12/15 0445) Resp:  [18-20] 18 (12/15 0445) BP:  (72-188)/(44-111) 168/96 (12/15 0445) SpO2:  [95 %-100 %] 100 % (12/15 0445) Weight:  [61.7 kg] 61.7 kg (12/14 1818)  Physical Exam: General: Alert and awake, oriented person and recognizes son, she is cachectic HEENT: anicteric sclera, EOMI CVS regular rate, normal no murmurs gallops or rubs heard Chest: , no wheezing, no respiratory distress Abdomen: soft non-distended,  Extremities: no edema or deformity noted bilaterally Skin: no rashes Neuro: Hemiplegia  CBC:    BMET Recent Labs    11/06/19 1511 11/07/19 0933  NA 128* 129*  K 5.9* 4.7  CL 90* 93*  CO2 24 26  GLUCOSE 146* 128*  BUN 48* 23  CREATININE 8.97* 5.23*  CALCIUM 9.3 8.7*     Liver Panel  Recent Labs    11/06/19 1511 11/07/19 0933  PROT 7.5  --   ALBUMIN 2.1* 2.0*  AST 18  --   ALT 12  --   ALKPHOS 86  --   BILITOT 0.2*  --        Sedimentation Rate No results for input(s): ESRSEDRATE in the last 72 hours. C-Reactive Protein No results for input(s): CRP in the last 72 hours.  Micro Results: Recent Results (from the past 720 hour(s))  SARS CORONAVIRUS 2 (TAT 6-24 HRS) Nasopharyngeal Nasopharyngeal Swab     Status: None   Collection  Time: 10/20/19  7:30 AM   Specimen: Nasopharyngeal Swab  Result Value Ref Range Status   SARS Coronavirus 2 NEGATIVE NEGATIVE Final    Comment: (NOTE) SARS-CoV-2 target nucleic acids are NOT DETECTED. The SARS-CoV-2 RNA is generally detectable in upper and lower respiratory specimens during the acute phase of infection. Negative results do not preclude SARS-CoV-2 infection, do not rule out co-infections with other pathogens, and should not be used as the sole basis for treatment or other patient management decisions. Negative results must be combined with clinical observations, patient history, and epidemiological information. The expected result is Negative. Fact Sheet for Patients: SugarRoll.be Fact Sheet for Healthcare  Providers: https://www.woods-mathews.com/ This test is not yet approved or cleared by the [place] FDA and  has been authorized for detection and/or diagnosis of SARS-CoV-2 by FDA under an Emergency Use Authorization (EUA). This EUA will remain  in effect (meaning this test can be used) for the duration of the COVID-19 declaration under Section 56 4(b)(1) of the Act, 21 U.S.C. section 360bbb-3(b)(1), unless the authorization is terminated or revoked sooner. Performed at [facility], [place] [address], [place], [place] [address]   Culture, blood (routine x 2)     Status: Abnormal   Collection Time: 10/22/19  7:35 AM   Specimen: BLOOD  Result Value Ref Range Status   Specimen Description BLOOD RIGHT ANTECUBITAL  Final   Special Requests   Final    BOTTLES DRAWN AEROBIC ONLY Blood Culture adequate volume   Culture  Setup Time   Final    GRAM NEGATIVE RODS AEROBIC BOTTLE ONLY CRITICAL VALUE NOTED.  VALUE IS CONSISTENT WITH PREVIOUSLY REPORTED AND CALLED VALUE.    Culture (A)  Final    PSEUDOMONAS AERUGINOSA SUSCEPTIBILITIES PERFORMED ON PREVIOUS CULTURE WITHIN THE LAST 5 DAYS. Performed at [facility], [place] [address] [place], [place] [address]    Report Status 10/25/2019 FINAL  Final  Culture, blood (routine x 2)     Status: Abnormal   Collection Time: 10/22/19  7:43 AM   Specimen: BLOOD LEFT HAND  Result Value Ref Range Status   Specimen Description BLOOD LEFT HAND  Final   Special Requests   Final    BOTTLES DRAWN AEROBIC ONLY Blood Culture adequate volume   Culture  Setup Time   Final    GRAM NEGATIVE RODS AEROBIC BOTTLE ONLY CRITICAL RESULT CALLED TO, READ BACK BY AND VERIFIED WITH: PHARMD V BRYK [id number] AT 725 AM BY CM Performed at [facility], [place] [address], [place], [place] [address]    Culture PSEUDOMONAS AERUGINOSA (A)  Final   Report Status 10/25/2019 FINAL  Final   Organism ID, Bacteria PSEUDOMONAS AERUGINOSA  Final       Susceptibility   Pseudomonas aeruginosa - MIC*    CEFTAZIDIME 2 SENSITIVE Sensitive     CIPROFLOXACIN <=0.25 SENSITIVE Sensitive     GENTAMICIN <=1 SENSITIVE Sensitive     IMIPENEM 1 SENSITIVE Sensitive     PIP/TAZO <=4 SENSITIVE Sensitive     CEFEPIME <=1 SENSITIVE Sensitive     * PSEUDOMONAS AERUGINOSA  Blood Culture ID Panel (Reflexed)     Status: Abnormal   Collection Time: 10/22/19  7:43 AM  Result Value Ref Range Status   Enterococcus species NOT DETECTED NOT DETECTED Final   Listeria monocytogenes NOT DETECTED NOT DETECTED Final   Staphylococcus species NOT DETECTED NOT DETECTED Final   Staphylococcus aureus (BCID) NOT DETECTED NOT DETECTED Final   Streptococcus  species NOT DETECTED NOT DETECTED Final   Streptococcus agalactiae NOT DETECTED NOT DETECTED Final   Streptococcus pneumoniae NOT DETECTED NOT DETECTED Final   Streptococcus pyogenes NOT DETECTED NOT DETECTED Final   Acinetobacter baumannii NOT DETECTED NOT DETECTED Final   Enterobacteriaceae species NOT DETECTED NOT DETECTED Final   Enterobacter cloacae complex NOT DETECTED NOT DETECTED Final   Escherichia coli NOT DETECTED NOT DETECTED Final   Klebsiella oxytoca NOT DETECTED NOT DETECTED Final   Klebsiella pneumoniae NOT DETECTED NOT DETECTED Final   Proteus species NOT DETECTED NOT DETECTED Final   Serratia marcescens NOT DETECTED NOT DETECTED Final   Carbapenem resistance NOT DETECTED NOT DETECTED Final   Haemophilus influenzae NOT DETECTED NOT DETECTED Final   Neisseria meningitidis NOT DETECTED NOT DETECTED Final   Pseudomonas aeruginosa DETECTED (A) NOT DETECTED Final    Comment: CRITICAL RESULT CALLED TO, READ BACK BY AND VERIFIED WITH: PHARMD V BRYK [id number] AT 725 AM BY CM    Candida albicans NOT DETECTED NOT DETECTED Final   Candida glabrata NOT DETECTED NOT DETECTED Final   Candida krusei NOT DETECTED NOT DETECTED Final   Candida parapsilosis NOT DETECTED NOT DETECTED Final   Candida tropicalis NOT  DETECTED NOT DETECTED Final    Comment: Performed at [facility], [place] [address] [place], [place] [address]  Culture, blood (routine x 2)     Status: Abnormal   Collection Time: 10/22/19  5:03 PM   Specimen: BLOOD LEFT HAND  Result Value Ref Range Status   Specimen Description BLOOD LEFT HAND  Final   Special Requests   Final    BOTTLES DRAWN AEROBIC AND ANAEROBIC Blood Culture adequate volume   Culture  Setup Time   Final    AEROBIC BOTTLE ONLY GRAM NEGATIVE RODS CRITICAL VALUE NOTED.  VALUE IS CONSISTENT WITH PREVIOUSLY REPORTED AND CALLED VALUE.    Culture (A)  Final    PSEUDOMONAS AERUGINOSA SUSCEPTIBILITIES PERFORMED ON PREVIOUS CULTURE WITHIN THE LAST 5 DAYS. Performed at [facility], [place] [address] [place], [place] [address]    Report Status 10/25/2019 FINAL  Final  Culture, blood (routine x 2)     Status: Abnormal   Collection Time: 10/22/19  5:08 PM   Specimen: BLOOD LEFT ARM  Result Value Ref Range Status   Specimen Description BLOOD LEFT ARM  Final   Special Requests   Final    BOTTLES DRAWN AEROBIC ONLY Blood Culture results may not be optimal due to an inadequate volume of blood received in culture bottles   Culture  Setup Time   Final    AEROBIC BOTTLE ONLY GRAM NEGATIVE RODS CRITICAL VALUE NOTED.  VALUE IS CONSISTENT WITH PREVIOUSLY REPORTED AND CALLED VALUE.    Culture (A)  Final    PSEUDOMONAS AERUGINOSA SUSCEPTIBILITIES PERFORMED ON PREVIOUS CULTURE WITHIN THE LAST 5 DAYS. Performed at [facility], [place] [address], [place], [place] [address]    Report Status 10/25/2019 FINAL  Final  Culture, blood (routine x 2)     Status: None   Collection Time: 10/24/19  1:18 PM   Specimen: BLOOD LEFT HAND  Result Value Ref Range Status   Specimen Description BLOOD LEFT HAND  Final   Special Requests   Final    BOTTLES DRAWN AEROBIC ONLY Blood Culture adequate volume   Culture   Final    NO GROWTH 5 DAYS Performed at [facility], [place] [address], [place], [place] [address]  Report Status 10/29/2019 FINAL  Final  Culture, blood (routine x 2)     Status: None   Collection Time: 10/24/19  1:28 PM   Specimen: BLOOD RIGHT HAND  Result Value Ref Range Status   Specimen Description BLOOD RIGHT HAND  Final   Special Requests   Final    BOTTLES DRAWN AEROBIC AND ANAEROBIC Blood Culture results may not be optimal due to an excessive volume of blood received in culture bottles   Culture   Final    NO GROWTH 5 DAYS Performed at [facility], [place] [address], [place], [place] [address]    Report Status 10/29/2019 FINAL  Final  Culture, blood (routine x 2)     Status: None   Collection Time: 10/29/19  9:25 PM   Specimen: BLOOD  Result Value Ref Range Status   Specimen Description BLOOD LEFT HAND  Final   Special Requests   Final    BOTTLES DRAWN AEROBIC ONLY Blood Culture results may not be optimal due to an inadequate volume of blood received in culture bottles   Culture   Final    NO GROWTH 5 DAYS Performed at [facility], [place] [address] [place], [place] [address]    Report Status 11/03/2019 FINAL  Final  Culture, blood (routine x 2)     Status: None   Collection Time: 10/29/19  9:27 PM   Specimen: BLOOD  Result Value Ref Range Status   Specimen Description BLOOD RIGHT HAND  Final   Special Requests   Final    BOTTLES DRAWN AEROBIC ONLY Blood Culture adequate volume   Culture   Final    NO GROWTH 5 DAYS Performed at [facility], [place] [address], [place], [place] [address]    Report Status 11/03/2019 FINAL  Final  Culture, Urine     Status: None (Preliminary result)   Collection Time: 11/06/19 11:07 AM   Specimen: Urine, Random  Result Value Ref Range Status   Specimen Description URINE, RANDOM  Final   Special Requests NONE  Final   Culture   Final    CULTURE REINCUBATED FOR BETTER GROWTH Performed at [facility], [place] [address] [place], [place] [address]    Report Status PENDING   Incomplete    Studies/Results: EEG  Result Date: 11/06/2019 Lora Havens, MD     11/06/2019  4:56 PM Patient Name: Jaemarie Kovatch MRN: [id number] Epilepsy Attending: Lora Havens Referring Physician/Provider: Dr Zeb Comfort Date: 11/06/2019 Duration: 21.47 mins Patient history: 68yo F with subacute strokes, no with ams and twitching. EEG to evaluate for seizure Level of alertness: lethargic/awake AEDs during EEG study: None Technical aspects: This EEG study was done with scalp electrodes positioned according to the 10-20 International system of electrode placement. Electrical activity was acquired at a sampling rate of 500Hz  and reviewed with a high frequency filter of 70Hz  and a low frequency filter of 1Hz . EEG data were recorded continuously and digitally stored. DESCRIPTION: EEG showed continuous generalized 3-5hz  theta-delta slowing. Triphasic waves, generalized, maximal bifrontal, at 1.5-2hz  were also noted. Hyperventilation and photic stimulation were not performed. Patient was noted to have diffuse twitching, pronounced when patient was stimulated without eeg change. ABNORMALITY - Triphasic waves, generalized - Continuous slow, generalized IMPRESSION: This study is suggestive of moderate to severe diffuse encephalopathy, non specific to etiology but could be secondary to toxic-metabolic causes, cefepime toxicity. No seizures or definite epileptiform discharges were seen throughout the recording. Patient  was noted to have diffuse jerking, more pronounces when patient was stimulated without concomitant eeg change. Therefore, this is likely myoclonus in setting of uremia, cefepime use. Lora Havens   DG Chest 2 View  Result Date: 11/06/2019 CLINICAL DATA:  Difficulty breathing EXAM: CHEST - 2 VIEW COMPARISON:  10/20/2019 FINDINGS: Right-sided dual lumen central venous catheter terminates at the level of the right atrium. Mild cardiomegaly, stable. Calcified aorta. Mild pulmonary  vascular congestion. No focal airspace consolidation, pleural effusion, or pneumothorax. IMPRESSION: Mild cardiomegaly and pulmonary vascular congestion. Electronically Signed   By: Davina Poke M.D.   On: 11/06/2019 09:55   CT HEAD WO CONTRAST  Result Date: 11/05/2019 CLINICAL DATA:  Stroke, AMS EXAM: CT HEAD WITHOUT CONTRAST TECHNIQUE: Contiguous axial images were obtained from the base of the skull through the vertex without intravenous contrast. COMPARISON:  10/27/2019, MR brain, 10/20/2019 FINDINGS: Brain: Decreased edema of a right corona radiata infarction (series 3, image 23). Right basal ganglia infarctions are not well appreciated. Unchanged, subtle left frontal white matter infarction (series 3, image 25) Vascular: No hyperdense vessel or unexpected calcification. Skull: Normal. Negative for fracture or focal lesion. Sinuses/Orbits: No acute finding. Other: None. IMPRESSION: Decreased edema of a subacute right corona radiata infarction. Otherwise unchanged noncontrast examination of the brain. Electronically Signed   By: Eddie Candle M.D.   On: 11/05/2019 18:55   MR BRAIN WO CONTRAST  Result Date: 11/06/2019 CLINICAL DATA:  Neuro deficit, acute, stroke suspected. Additional history provided: Acute CVA with evidence of chronic ischemia/old infarcts. EXAM: MRI HEAD WITHOUT CONTRAST TECHNIQUE: Multiplanar, multiecho pulse sequences of the brain and surrounding structures were obtained without intravenous contrast. COMPARISON:  Head CT 11/05/2019 FINDINGS: Brain: Redemonstrated now subacute infarct within the right caudate head with mild associated chronic blood products. There is persistent although decreased restricted diffusion at site of previously demonstrated now subacute right corona radiata/basal ganglia infarcts. Vague restricted diffusion also persists at site of a subacute left frontal centrum semiovale subacute infarct. Restricted diffusion also remains at site of several additional  left frontoparietal centrum semiovale punctate subacute infarcts more posteriorly, none of which are definitively new from prior MRI 10/20/2019. A small chronic cortical infarct within the left post central gyrus was better appreciated on prior MRI. Background of mild generalized parenchymal atrophy and chronic small vessel ischemic disease. Redemonstrated chronic lacunar infarcts within the left thalamus and left cerebellum. No midline shift or extra-axial fluid collection. There are a few scattered supratentorial foci of microhemorrhage Vascular: Flow voids maintained within the proximal large arterial vessels. Skull and upper cervical spine: Generalized decreased T1 marrow signal, likely reflecting renal osteodystrophy, unchanged. Sinuses/Orbits: Right lens replacement. Mild scattered paranasal sinus mucosal thickening. No significant mastoid effusion. IMPRESSION: Redemonstrated are multiple subacute infarcts within the right corona radiata/basal ganglia and left centrum semiovale. No definitively new infarct is identified. Redemonstrated small chronic cortical infarct within the left post central gyrus. Background of mild generalized parenchymal atrophy and chronic small vessel ischemic disease. Redemonstrated chronic lacunar infarcts within the left thalamus and left cerebellum. Electronically Signed   By: Kellie Simmering DO   On: 11/06/2019 12:41   Overnight EEG with video  Result Date: 11/07/2019 Lora Havens, MD     11/07/2019 10:53 AM Patient Name: Afifa Nesser MRN: [id number] Epilepsy Attending: Lora Havens Referring Physician/Provider: Dr Zeb Comfort Duration: 11/06/2019 1649 to 11/07/2019 1003  Patient history: 68yo F with subacute strokes, no with ams and twitching. EEG to evaluate for seizure  Level  of alertness: lethargic/awake, asleep  AEDs during EEG study: None  Technical aspects: This EEG study was done with scalp electrodes positioned according to the 10-20 International  system of electrode placement. Electrical activity was acquired at a sampling rate of 500Hz  and reviewed with a high frequency filter of 70Hz  and a low frequency filter of 1Hz . EEG data were recorded continuously and digitally stored.  DESCRIPTION: EEG showed continuous generalized 3-5hz  theta-delta slowing. Triphasic waves, generalized, maximal bifrontal, at 1.5-2hz  were also noted. Hyperventilation and photic stimulation were not performed.  ABNORMALITY - Triphasic waves, generalized - Continuous slow, generalized  IMPRESSION: This study is suggestive of moderate to severe diffuse encephalopathy, non specific to etiology but could be secondary to toxic-metabolic causes, cefepime toxicity. No seizures or definite epileptiform discharges were seen throughout the recording.  Priyanka [name]      Assessment/Plan:  INTERVAL HISTORY: Patient had a seizure  Principal Problem:   Cerebrovascular accident (CVA) of right basal ganglia (HCC) Active Problems:   Slow transit constipation   Acute on chronic anemia   Labile blood pressure   Labile blood glucose   Diabetes mellitus type 2 in nonobese (HCC)   Bacteremia    Tamela Fuchs is a 68 y.o. female with  PsA native MV endocarditis but also concern that she has left sided disease and CNS septic emboli. She was on cefepime alone to which ciprofloxacin was added.  She has now had a seizure.  I will discontinue her ciprofloxacin but I want to continue her on cefepime to optimize CNS penetration and to complete her therapy for pseudomonal endocarditis.  I do not have a good option to go to beyond beta-lactam therapy to effectively treat her endocarditis and achieve adequate CNS penetration.  If the beta-lactam is causing seizures she may need to be on antiepileptic drugs.  Of note fluoroquinolones also known to lower the seizure threshold.  I communicated the plan to the patient and her son.  Please call with other questions   LOS: 6 days     Alcide Evener 11/07/2019, 1:04 PM

## 2019-11-07 NOTE — Progress Notes (Signed)
Son called( [name]) and updates provided, states he would like to speak with Becky,SW In the morning,will notify

## 2019-11-07 NOTE — Progress Notes (Signed)
Just received a call from patient's son '[name]', proved updates and reassurances states he plan on coming in very early this morning to see if he can get his mom transported back to a regular floor., provided support and reasuusnces,

## 2019-11-07 NOTE — Progress Notes (Addendum)
Reason for consult: Altered mental status  Subjective: Patient son at bedside.  States patient is 100% better in terms of mentation, speech and motor activity.  Patient states she does not remember what happened yesterday but is feeling good today.  ROS: negative except above  Examination  Vital signs in last 24 hours: Temp:  [96.8 F (36 C)-99.4 F (37.4 C)] 98.4 F (36.9 C) (12/15 0445) Pulse Rate:  [98-120] 104 (12/15 0445) Resp:  [18-20] 18 (12/15 0445) BP: (72-188)/(44-111) 168/96 (12/15 0445) SpO2:  [95 %-100 %] 100 % (12/15 0445) Weight:  [61.7 kg] 61.7 kg (12/14 1818)  General: lying in bed, not in apparent distress CVS: pulse-normal rate and rhythm RS: breathing comfortably Extremities: normal   Neuro: MS: Alert, oriented, follows commands CN: pupils equal and reactive,  EOMI, face symmetric, tongue midline, normal sensation over face, Motor: 4/5 strength in all 4 extremities, appears to have trouble with fine motor movements Coordination: Significant intentional tremor with jerky movements on finger-to-nose testing Gait: not tested  Basic Metabolic Panel: Recent Labs  Lab 11/01/19 0421 11/03/19 1320 11/06/19 1511  NA 136 133* 128*  K 4.7 4.8 5.9*  CL 96* 93* 90*  CO2 27 28 24   GLUCOSE 158* 254* 146*  BUN 31* 38* 48*  CREATININE 5.59* 6.71* 8.97*  CALCIUM 8.9 9.3 9.3  PHOS  --  2.8 3.9    CBC: Recent Labs  Lab 11/03/19 0612 11/04/19 0546 11/05/19 0528 11/06/19 0706 11/07/19 0606  WBC 9.0 8.3 7.7 7.1 9.1  HGB 8.2* 8.9* 8.5* 8.2* 9.5*  HCT 26.7* 28.8* 27.7* 26.1* 30.2*  MCV 92.7 93.8 94.5 92.6 92.4  PLT 182 195 192 212 221     Coagulation Studies: Recent Labs    11/05/19 0528 11/06/19 0706 11/07/19 0606  LABPROT 29.8* 29.9* 29.0*  INR 2.8* 2.9* 2.8*    Imaging MRI brain without contrast 11/06/2019: Redemonstrated are multiple subacute infarcts within the right corona radiata/basal ganglia and left centrum semiovale. No definitively new  infarct is identified.  Redemonstrated small chronic cortical infarct within the left post central gyrus.  Background of mild generalized parenchymal atrophy and chronic small vessel ischemic disease. Redemonstrated chronic lacunar infarcts within the left thalamus and left cerebellum.   ASSESSMENT AND PLAN: 68 year old female with acute infarcts as described above now with worsening mental status.  Myoclonus (resolved) Subacute and chronic infarcts Encephalopathy(improving) -Patient has had significant improvement in mental status and speech, the myoclonus-like movements have since resolved.  Therefore, this was all most likely secondary to uremia and possibly cefepime toxicity in setting of end-stage renal disease.  Patient also hyponatremic and has fluctuating blood pressure which could be contributing as well.  Recommendations - EEG showed triphasic waves. Patient was noted to have twitching during the eeg without any change to suggest seizure. This is likely myoclonus in setting of esrd and possible precipitated by cepefime use.  Will discontinue LTM EEG - Consider switching cefepime to another antibiotic if possible -Patient hyponatremic, will defer further management to primary team. -Discussed difference between myoclonus and seizure with son at bedside because patient did have stroke and may have seizures in future.  Also discussed the role of gabapentin and myoclonus as well as the role of dialysis in future. -Recommend blood pressure management with SBP<180 -If myoclonus persists or recurs, consider this holding gabapentin.    Thank you for allowing [name] to participate in the care of this patient.  Neurology will sign off.  Please page neuro hospitalist for  any further questions after 5 PM.   I have spent a total of   56minutes with the patient reviewing hospital notes,  test results, labs and examining the patient as well as establishing an assessment and plan that was  discussed personally with the patient and son at bedside.  > 50% of time was spent in direct patient care.       Chermaine Schnyder [name]

## 2019-11-07 NOTE — Procedures (Signed)
Patient Name: [name]  MRN: [id number]  Epilepsy Attending: Lora Havens  Referring Physician/Provider: Dr Zeb Comfort Duration: 11/06/2019 1649 to 11/07/2019 1003  Patient history: 68yo F with subacute strokes, no with ams and twitching. EEG to evaluate for seizure  Level of alertness: lethargic/awake, asleep  AEDs during EEG study: None  Technical aspects: This EEG study was done with scalp electrodes positioned according to the 10-20 International system of electrode placement. Electrical activity was acquired at a sampling rate of 500Hz  and reviewed with a high frequency filter of 70Hz  and a low frequency filter of 1Hz . EEG data were recorded continuously and digitally stored.   DESCRIPTION: EEG showed continuous generalized 3-5hz  theta-delta slowing. Triphasic waves, generalized, maximal bifrontal, at 1.5-2hz  were also noted. Hyperventilation and photic stimulation were not performed.  ABNORMALITY - Triphasic waves, generalized - Continuous slow, generalized  IMPRESSION: This study is suggestive of moderate to severe diffuse encephalopathy, non specific to etiology but could be secondary to toxic-metabolic causes, cefepime toxicity. No seizures or definite epileptiform discharges were seen throughout the recording.  [name]

## 2019-11-07 NOTE — Progress Notes (Signed)
Physical Therapy Session Note  Patient Details  Name: [name] MRN: [id number] Date of Birth: [birth year]  Today's Date: 11/07/2019 PT Individual Time: 1300-1358 PT Individual Time Calculation (min): 58 min   Short Term Goals: Week 1:  PT Short Term Goal 1 (Week 1): pt will transfer supine<>sit and sit<>supine min A PT Short Term Goal 2 (Week 1): Pt will transfer stand<>pivot with LRAD mod A PT Short Term Goal 3 (Week 1): Pt will perform car transfer with LRAD mod A  Skilled Therapeutic Interventions/Progress Updates:   Received pt supine in bed, pt agreeable to therapy, and stated no pain at rest. Pt's son present in room. Session focused on functional mobility including bed mobility and transfers, LE strength, sitting balance, and improved activity tolerance. Pt demonstrated increased LLE muscle spasms throughout session and continues to be limited by low back pain. Pt donned pants in supine with mod A via rolling L and R with min A and use of bedrails. Pt required verbal and tactile cues for technique when rolling. Pt transferred supine<>sit mod A and required max A to maintain sitting balance EOB with bilateral UE support. Pt continues to demonstrate strong L lateral lean and is unable to correct despite max verbal cues to shift weight to R side. Pt worked on static sitting balance while seated EOB but reported increased low back pain and was in hurry to transfer to [facility]. Therapist donned TLSO brace with total assist +2 to maintain pt's seated balance. Pt reported that the brace felt like it was choking her; therapist adjusted brace and took off chest support; pt reported it felt more comfortable. Pt transferred lateral scoot to R without AD mod +2 for trunk control and LE management. Pt transported to therapy gym in [facility] for time management purposes. While seated in [place] pt worked on LE strengthening exercises including bilateral active assisted hip flexion 2x8, bilateral knee extension x8, hip  abd/add 2x5, RLE hip extension against resistance, pt reported increase pain on LLE and declined activity. Therapist performed passive bilateral hamstring stretch 2x30 second hold. Concluded session with pt sitting in WC, needs within reach, and seatbelt alarm on. 15 minutes of missed skilled physical therapy.  Therapy Documentation Precautions:  Precautions Precautions: Fall Precaution Comments: Strong L lateral lean, TLSO for mobility Required Braces or Orthoses: Other Brace(TLSO) Spinal Brace: Thoracolumbosacral orthotic Spinal Brace Comments: for comfort with mobility Restrictions Weight Bearing Restrictions: No   Therapy/Group: Individual Therapy Alfonse Alpers PT, DPT   11/07/2019, 7:31 AM

## 2019-11-07 NOTE — Progress Notes (Signed)
[facility] Progress Note   Dialysis Orders: [facility]  MWF3.5 hours TDC - mult failed accesses Profile 1 - pressure drops very easily;max is pull 1.1 an hour; do not pull rinseback revaclear 300 dialyzer 400/600 2 K 2.5 Ca EDW 62.5  Epo 1200 U q HD; iron 50 mg/weekly  1.5 mcg hectoral each tx  Assessment/Plan: 1.  Recurrent Pseudomonas bacteremia (Likely HD CRB) with evidence of mitral valve endocarditis that is post [facility] exchange for infection focus control.  Seen by cardiothoracic surgery and no imminent plan for surgical intervention-this will be reevaluated as an outpatient following completion of antibiotic therapy with cefepime; now off cipro as of 12/14 2. ESRD: MWF - HD next Wed. 3. Anemia: Hgb 8.9   had 100 Aran esp ordered for 12/9 but don't think it was given - on low dose Epo prior to admission - plan startedt 60 Aran esp 12/14 - tsat 15% ferritin 1183 -- no Fe for the short term due to currently being treatment for infection. 4. CKD-MBD: Phosphorus level within acceptable range, will continue to monitor on renal diet/binders, on VDRA -  5.  Acute CVA with evidence of chronic ischemia/old infarcts: Seen by neurology-suspected embolic infarct from SBE.  Ongoing inpatient rehabilitation for management of neurological deficits. Repeat MRI should not acute changes 6. Hypertension/volume :   Keep SBP < 180 per neuro. - net UF 2 L Friday and 1 L Monday  Na low Monday - will need more volume off next HD  CXR 12/14 - showed some pul vasc congestion 7. Nutrition - diet liberalized to regular - has supplements/multivits ordered - alb very low no fever  8. MS change since 12/13 - no obvious findings on work up  Per notes may have been cefepime related - seem back to baseline today.  Neurontin 100 mg per day stopped 12/14  Myriam Jacobson, PA-C [facility] Beeper [phone] 11/07/2019,11:35 AM  LOS: 6 days   Subjective:   Doesn't recall events of yesterday.    Objective Vitals:   11/06/19 1800 11/06/19 1818 11/06/19 1924 11/07/19 0445  BP: (!) 150/92 (!) 158/94 (!) 146/86 (!) 168/96  Pulse: (!) 107 (!) 106 (!) 106 (!) 104  Resp:  20 20 18   Temp:  (!) 96.8 F (36 C) 98.5 F (36.9 C) 98.4 F (36.9 C)  TempSrc:  Oral Oral Oral  SpO2:  97% 96% 100%  Weight:  61.7 kg    Height:       Physical Exam General: sitting up in bed breathing easily Heart: RRR Lungs: grossly clear anteriorly Abdomen: soft NT Extremities: no edema Dialysis Access: right IJ [facility] Neuro: mentation much clear today - able to answer questions with clear speech.   Additional Objective Labs: Basic Metabolic Panel: Recent Labs  Lab 11/01/19 0421 11/03/19 1320 11/06/19 1511  NA 136 133* 128*  K 4.7 4.8 5.9*  CL 96* 93* 90*  CO2 27 28 24   GLUCOSE 158* 254* 146*  BUN 31* 38* 48*  CREATININE 5.59* 6.71* 8.97*  CALCIUM 8.9 9.3 9.3  PHOS  --  2.8 3.9   Liver Function Tests: Recent Labs  Lab 11/03/19 1320 11/06/19 1511  AST  --  18  ALT  --  12  ALKPHOS  --  86  BILITOT  --  0.2*  PROT  --  7.5  ALBUMIN 1.9* 2.1*   No results for input(s): LIPASE, AMYLASE in the last 168 hours. CBC: Recent Labs  Lab 11/03/19  [id number] 11/04/19 0546 11/05/19 0528 11/06/19 0706 11/07/19 0606  WBC 9.0 8.3 7.7 7.1 9.1  HGB 8.2* 8.9* 8.5* 8.2* 9.5*  HCT 26.7* 28.8* 27.7* 26.1* 30.2*  MCV 92.7 93.8 94.5 92.6 92.4  PLT 182 195 192 212 221   Blood Culture    Component Value Date/Time   SDES URINE, RANDOM 11/06/2019 1107   SPECREQUEST NONE 11/06/2019 1107   CULT  11/06/2019 1107    CULTURE REINCUBATED FOR BETTER GROWTH Performed at [place] [address] [place], [place] [address]    REPTSTATUS PENDING 11/06/2019 1107    Cardiac Enzymes: No results for input(s): CKTOTAL, CKMB, CKMBINDEX, TROPONINI in the last 168 hours. CBG: Recent Labs  Lab 11/06/19 0827 11/06/19 1121 11/06/19 1938 11/06/19 2117 11/07/19 0624  GLUCAP 125* 157* 127* 161* 141*    Iron Studies:  Recent Labs    11/06/19 1511  IRON 36  TIBC 224*  FERRITIN 1,183*   Lab Results  Component Value Date   INR 2.8 (H) 11/07/2019   INR 2.9 (H) 11/06/2019   INR 2.8 (H) 11/05/2019   Studies/Results: EEG  Result Date: 11/06/2019 Lora Havens, MD     11/06/2019  4:56 PM Patient Name: Dainelle Folmer MRN: [id number] Epilepsy Attending: Lora Havens Referring Physician/Provider: Dr Zeb Comfort Date: 11/06/2019 Duration: 21.47 mins Patient history: 68yo F with subacute strokes, no with ams and twitching. EEG to evaluate for seizure Level of alertness: lethargic/awake AEDs during EEG study: None Technical aspects: This EEG study was done with scalp electrodes positioned according to the 10-20 International system of electrode placement. Electrical activity was acquired at a sampling rate of 500Hz  and reviewed with a high frequency filter of 70Hz  and a low frequency filter of 1Hz . EEG data were recorded continuously and digitally stored. DESCRIPTION: EEG showed continuous generalized 3-5hz  theta-delta slowing. Triphasic waves, generalized, maximal bifrontal, at 1.5-2hz  were also noted. Hyperventilation and photic stimulation were not performed. Patient was noted to have diffuse twitching, pronounced when patient was stimulated without eeg change. ABNORMALITY - Triphasic waves, generalized - Continuous slow, generalized IMPRESSION: This study is suggestive of moderate to severe diffuse encephalopathy, non specific to etiology but could be secondary to toxic-metabolic causes, cefepime toxicity. No seizures or definite epileptiform discharges were seen throughout the recording. Patient was noted to have diffuse jerking, more pronounces when patient was stimulated without concomitant eeg change. Therefore, this is likely myoclonus in setting of uremia, cefepime use. Lora Havens   DG Chest 2 View  Result Date: 11/06/2019 CLINICAL DATA:  Difficulty breathing EXAM: CHEST - 2  VIEW COMPARISON:  10/20/2019 FINDINGS: Right-sided dual lumen central venous catheter terminates at the level of the right atrium. Mild cardiomegaly, stable. Calcified aorta. Mild pulmonary vascular congestion. No focal airspace consolidation, pleural effusion, or pneumothorax. IMPRESSION: Mild cardiomegaly and pulmonary vascular congestion. Electronically Signed   By: Davina Poke M.D.   On: 11/06/2019 09:55   CT HEAD WO CONTRAST  Result Date: 11/05/2019 CLINICAL DATA:  Stroke, AMS EXAM: CT HEAD WITHOUT CONTRAST TECHNIQUE: Contiguous axial images were obtained from the base of the skull through the vertex without intravenous contrast. COMPARISON:  10/27/2019, MR brain, 10/20/2019 FINDINGS: Brain: Decreased edema of a right corona radiata infarction (series 3, image 23). Right basal ganglia infarctions are not well appreciated. Unchanged, subtle left frontal white matter infarction (series 3, image 25) Vascular: No hyperdense vessel or unexpected calcification. Skull: Normal. Negative for fracture or focal lesion. Sinuses/Orbits: No acute finding. Other: None.  IMPRESSION: Decreased edema of a subacute right corona radiata infarction. Otherwise unchanged noncontrast examination of the brain. Electronically Signed   By: Eddie Candle M.D.   On: 11/05/2019 18:55   MR BRAIN WO CONTRAST  Result Date: 11/06/2019 CLINICAL DATA:  Neuro deficit, acute, stroke suspected. Additional history provided: Acute CVA with evidence of chronic ischemia/old infarcts. EXAM: MRI HEAD WITHOUT CONTRAST TECHNIQUE: Multiplanar, multiecho pulse sequences of the brain and surrounding structures were obtained without intravenous contrast. COMPARISON:  Head CT 11/05/2019 FINDINGS: Brain: Redemonstrated now subacute infarct within the right caudate head with mild associated chronic blood products. There is persistent although decreased restricted diffusion at site of previously demonstrated now subacute right corona radiata/basal  ganglia infarcts. Vague restricted diffusion also persists at site of a subacute left frontal centrum semiovale subacute infarct. Restricted diffusion also remains at site of several additional left frontoparietal centrum semiovale punctate subacute infarcts more posteriorly, none of which are definitively new from prior MRI 10/20/2019. A small chronic cortical infarct within the left post central gyrus was better appreciated on prior MRI. Background of mild generalized parenchymal atrophy and chronic small vessel ischemic disease. Redemonstrated chronic lacunar infarcts within the left thalamus and left cerebellum. No midline shift or extra-axial fluid collection. There are a few scattered supratentorial foci of microhemorrhage Vascular: Flow voids maintained within the proximal large arterial vessels. Skull and upper cervical spine: Generalized decreased T1 marrow signal, likely reflecting renal osteodystrophy, unchanged. Sinuses/Orbits: Right lens replacement. Mild scattered paranasal sinus mucosal thickening. No significant mastoid effusion. IMPRESSION: Redemonstrated are multiple subacute infarcts within the right corona radiata/basal ganglia and left centrum semiovale. No definitively new infarct is identified. Redemonstrated small chronic cortical infarct within the left post central gyrus. Background of mild generalized parenchymal atrophy and chronic small vessel ischemic disease. Redemonstrated chronic lacunar infarcts within the left thalamus and left cerebellum. Electronically Signed   By: Kellie Simmering DO   On: 11/06/2019 12:41   Overnight EEG with video  Result Date: 11/07/2019 Lora Havens, MD     11/07/2019 10:53 AM Patient Name: July Czepiel MRN: [id number] Epilepsy Attending: Lora Havens Referring Physician/Provider: Dr Zeb Comfort Duration: 11/06/2019 1649 to 11/07/2019 1003  Patient history: 68yo F with subacute strokes, no with ams and twitching. EEG to evaluate for seizure   Level of alertness: lethargic/awake, asleep  AEDs during EEG study: None  Technical aspects: This EEG study was done with scalp electrodes positioned according to the 10-20 International system of electrode placement. Electrical activity was acquired at a sampling rate of 500Hz  and reviewed with a high frequency filter of 70Hz  and a low frequency filter of 1Hz . EEG data were recorded continuously and digitally stored.  DESCRIPTION: EEG showed continuous generalized 3-5hz  theta-delta slowing. Triphasic waves, generalized, maximal bifrontal, at 1.5-2hz  were also noted. Hyperventilation and photic stimulation were not performed.  ABNORMALITY - Triphasic waves, generalized - Continuous slow, generalized  IMPRESSION: This study is suggestive of moderate to severe diffuse encephalopathy, non specific to etiology but could be secondary to toxic-metabolic causes, cefepime toxicity. No seizures or definite epileptiform discharges were seen throughout the recording.  Priyanka [name]   Medications: . ceFEPime (MAXIPIME) IV 1 g (11/06/19 2041)   . acetaminophen  500 mg Oral TID  . calcium carbonate  1 tablet Oral 2 times per day  . carvedilol  12.5 mg Oral BID  . Chlorhexidine Gluconate Cloth  6 each Topical Daily  . darbepoetin (ARANESP) injection - DIALYSIS  60 mcg Intravenous Q Mon-HD  .  doxercalciferol  1.5 mcg Intravenous Q M,W,F-HD  . feeding supplement (ENSURE ENLIVE)  237 mL Oral BID BM  . gabapentin  100 mg Oral QHS  . insulin aspart  0-9 Units Subcutaneous TID WC  . isosorbide mononitrate  30 mg Oral QPC lunch  . multivitamin  1 tablet Oral QPC lunch  . senna-docusate  1 tablet Oral QHS  . Warfarin - Pharmacist Dosing Inpatient   Does not apply [phone]

## 2019-11-07 NOTE — Progress Notes (Signed)
Occupational Therapy Session Note  Patient Details  Name: [name] MRN: [id number] Date of Birth: Sep 16, 1951  Today's Date: 11/07/2019 OT Individual Time: 1403-1450 OT Individual Time Calculation (min): 47 min (missed 13 mins due to pain/fatigue)   Short Term Goals: Week 1:  OT Short Term Goal 1 (Week 1): Pt will perform shower transfer with mod A. OT Short Term Goal 2 (Week 1): Pt will perform toileting with mod A. OT Short Term Goal 3 (Week 1): Pt will don back brace with min A overall. OT Short Term Goal 4 (Week 1): Pt will perform LB dressing with max A overall.  Skilled Therapeutic Interventions/Progress Updates:    Treatment session with focus on transfers, sitting balance, and functional use of BUE.  Pt received upright in w/c reporting need to get back to bed.  Pt expressing desire to bathe, but wanting to get back to bed first due to pain in back.  When questioned about back pain, pt's son reports back has been bothering her since the summer.  Completed squat pivot transfer max assist back to bed.  Attempted to engage pt in any aspect of UB bathing while seated EOB, however due to pain pt requested to return to supine.  Donned TLSO while seated EOB and then back to bed with mod assist.  Engaged in UB bathing semi-reclined in bed with assist to wash RUE as pt with LUE weakness/inability to reach to thoroughly wash underarm.  Noted increased "jerkiness" in BUE during bathing.  Engaged in [place] with 1# dowel rod with focus on bilateral movements and symmetry.  Pt required visual target to reach towards to promote increased movement.  Jerkiness in BUE with difficulty completing task as directed.  Also noted pt to have difficulty releasing items when holding with Rt hand.  Requiring hand over hand assist to release dowel rod.  Pt falling asleep throughout session, requiring encouragement to attempt to maintain arousal.  Pt requesting to terminate session early due to  pain and fatigue.  Therapy Documentation Precautions:  Precautions Precautions: Fall Precaution Comments: Strong L lateral lean, TLSO for mobility Required Braces or Orthoses: Other Brace(TLSO) Spinal Brace: Thoracolumbosacral orthotic Spinal Brace Comments: for comfort with mobility Restrictions Weight Bearing Restrictions: No General: General OT Amount of Missed Time: 13 Minutes  Vital Signs: Therapy Vitals Temp: 98.3 F (36.8 C) Pulse Rate: 93 Resp: 17 BP: (!) 164/81 Patient Position (if appropriate): Lying Oxygen Therapy SpO2: 96 % O2 Device: Room Air Pain:  Pt with c/o pain, unrated.  Returned to bed   Therapy/Group: Individual Therapy  [name] 11/07/2019, 3:27 PM

## 2019-11-07 NOTE — Progress Notes (Addendum)
[facility] PHYSICAL MEDICINE & REHABILITATION PROGRESS NOTE   Subjective/Complaints: Patient seen laying in bed this AM, EEG going on.  She states she slept well overnight.   ROS: Limited due to cognition/language.  Objective:   EEG  Result Date: 11/06/2019 Lora Havens, MD     11/06/2019  4:56 PM Patient Name: [name] MRN: [id number] Epilepsy Attending: Lora Havens Referring Physician/Provider: Dr Zeb Comfort Date: 11/06/2019 Duration: 21.47 mins Patient history: 68yo F with subacute strokes, no with ams and twitching. EEG to evaluate for seizure Level of alertness: lethargic/awake AEDs during EEG study: None Technical aspects: This EEG study was done with scalp electrodes positioned according to the 10-20 International system of electrode placement. Electrical activity was acquired at a sampling rate of 500Hz  and reviewed with a high frequency filter of 70Hz  and a low frequency filter of 1Hz . EEG data were recorded continuously and digitally stored. DESCRIPTION: EEG showed continuous generalized 3-5hz  theta-delta slowing. Triphasic waves, generalized, maximal bifrontal, at 1.5-2hz  were also noted. Hyperventilation and photic stimulation were not performed. Patient was noted to have diffuse twitching, pronounced when patient was stimulated without eeg change. ABNORMALITY - Triphasic waves, generalized - Continuous slow, generalized IMPRESSION: This study is suggestive of moderate to severe diffuse encephalopathy, non specific to etiology but could be secondary to toxic-metabolic causes, cefepime toxicity. No seizures or definite epileptiform discharges were seen throughout the recording. Patient was noted to have diffuse jerking, more pronounces when patient was stimulated without concomitant eeg change. Therefore, this is likely myoclonus in setting of uremia, cefepime use. Lora Havens   DG Chest 2 View  Result Date: 11/06/2019 CLINICAL DATA:  Difficulty breathing EXAM:  CHEST - 2 VIEW COMPARISON:  10/20/2019 FINDINGS: Right-sided dual lumen central venous catheter terminates at the level of the right atrium. Mild cardiomegaly, stable. Calcified aorta. Mild pulmonary vascular congestion. No focal airspace consolidation, pleural effusion, or pneumothorax. IMPRESSION: Mild cardiomegaly and pulmonary vascular congestion. Electronically Signed   By: Davina Poke M.D.   On: 11/06/2019 09:55   CT HEAD WO CONTRAST  Result Date: 11/05/2019 CLINICAL DATA:  Stroke, AMS EXAM: CT HEAD WITHOUT CONTRAST TECHNIQUE: Contiguous axial images were obtained from the base of the skull through the vertex without intravenous contrast. COMPARISON:  10/27/2019, MR brain, 10/20/2019 FINDINGS: Brain: Decreased edema of a right corona radiata infarction (series 3, image 23). Right basal ganglia infarctions are not well appreciated. Unchanged, subtle left frontal white matter infarction (series 3, image 25) Vascular: No hyperdense vessel or unexpected calcification. Skull: Normal. Negative for fracture or focal lesion. Sinuses/Orbits: No acute finding. Other: None. IMPRESSION: Decreased edema of a subacute right corona radiata infarction. Otherwise unchanged noncontrast examination of the brain. Electronically Signed   By: Eddie Candle M.D.   On: 11/05/2019 18:55   MR BRAIN WO CONTRAST  Result Date: 11/06/2019 CLINICAL DATA:  Neuro deficit, acute, stroke suspected. Additional history provided: Acute CVA with evidence of chronic ischemia/old infarcts. EXAM: MRI HEAD WITHOUT CONTRAST TECHNIQUE: Multiplanar, multiecho pulse sequences of the brain and surrounding structures were obtained without intravenous contrast. COMPARISON:  Head CT 11/05/2019 FINDINGS: Brain: Redemonstrated now subacute infarct within the right caudate head with mild associated chronic blood products. There is persistent although decreased restricted diffusion at site of previously demonstrated now subacute right corona  radiata/basal ganglia infarcts. Vague restricted diffusion also persists at site of a subacute left frontal centrum semiovale subacute infarct. Restricted diffusion also remains at site of several additional left frontoparietal centrum semiovale punctate  subacute infarcts more posteriorly, none of which are definitively new from prior MRI 10/20/2019. A small chronic cortical infarct within the left post central gyrus was better appreciated on prior MRI. Background of mild generalized parenchymal atrophy and chronic small vessel ischemic disease. Redemonstrated chronic lacunar infarcts within the left thalamus and left cerebellum. No midline shift or extra-axial fluid collection. There are a few scattered supratentorial foci of microhemorrhage Vascular: Flow voids maintained within the proximal large arterial vessels. Skull and upper cervical spine: Generalized decreased T1 marrow signal, likely reflecting renal osteodystrophy, unchanged. Sinuses/Orbits: Right lens replacement. Mild scattered paranasal sinus mucosal thickening. No significant mastoid effusion. IMPRESSION: Redemonstrated are multiple subacute infarcts within the right corona radiata/basal ganglia and left centrum semiovale. No definitively new infarct is identified. Redemonstrated small chronic cortical infarct within the left post central gyrus. Background of mild generalized parenchymal atrophy and chronic small vessel ischemic disease. Redemonstrated chronic lacunar infarcts within the left thalamus and left cerebellum. Electronically Signed   By: Kellie Simmering DO   On: 11/06/2019 12:41   Recent Labs    11/06/19 0706 11/07/19 0606  WBC 7.1 9.1  HGB 8.2* 9.5*  HCT 26.1* 30.2*  PLT 212 221   Recent Labs    11/06/19 1511  NA 128*  K 5.9*  CL 90*  CO2 24  GLUCOSE 146*  BUN 48*  CREATININE 8.97*  CALCIUM 9.3    Intake/Output Summary (Last 24 hours) at 11/07/2019 0853 Last data filed at 11/07/2019 0500 Gross per 24 hour  Intake  360 ml  Output 1006 ml  Net -646 ml     Physical Exam: Vital Signs Blood pressure (!) 168/96, pulse (!) 104, temperature 98.4 F (36.9 C), temperature source Oral, resp. rate 18, height 5\' 7"  (1.702 m), weight 61.7 kg, SpO2 100 %. Constitutional: No distress . Vital signs reviewed. HENT: Normocephalic.  Atraumatic. Eyes: EOMI. No discharge. Cardiovascular: No JVD. Respiratory: Normal effort.  No stridor. GI: Non-distended. Skin: Warm and dry.  Intact. Psych: Confused, tangential. Musc: No edema in extremities.  No tenderness in extremities. Neurological: Alert and oriented x2. Motor: RUE: 5/5 proximal to distal RLE: HF, KE 2/5, ADF 4/5 LUE: 4/5 proximal to distal LLE: HF, KE 2/5, ADF 3/5  Assessment/Plan: 1. Functional deficits secondary to right basal ganglia stroke which require 3+ hours per day of interdisciplinary therapy in a comprehensive inpatient rehab setting.  Physiatrist is providing close team supervision and 24 hour management of active medical problems listed below.  Physiatrist and rehab team continue to assess barriers to discharge/monitor patient progress toward functional and medical goals  Care Tool:  Bathing    Body parts bathed by patient: Left arm, Chest, Abdomen, Front perineal area, Right upper leg, Left upper leg, Face   Body parts bathed by helper: Right arm, Buttocks, Right lower leg, Left lower leg     Bathing assist Assist Level: Moderate Assistance - Patient 50 - 74%     Upper Body Dressing/Undressing Upper body dressing   What is the patient wearing?: Pull over shirt    Upper body assist Assist Level: Moderate Assistance - Patient 50 - 74%    Lower Body Dressing/Undressing Lower body dressing      What is the patient wearing?: Pants     Lower body assist Assist for lower body dressing: Moderate Assistance - Patient 50 - 74%     Toileting Toileting    Toileting assist Assist for toileting: Minimal Assistance - Patient > 75%  Transfers Chair/bed transfer  Transfers assist     Chair/bed transfer assist level: Maximal Assistance - Patient 25 - 49%     Locomotion Ambulation   Ambulation assist   Ambulation activity did not occur: Safety/medical concerns(back pain, bilateral LE weakness, poor bilateral knee control)          Walk 10 feet activity   Assist  Walk 10 feet activity did not occur: Safety/medical concerns(back pain, bilateral LE weakness, poor bilateral knee control)        Walk 50 feet activity   Assist Walk 50 feet with 2 turns activity did not occur: Safety/medical concerns(back pain, bilateral LE weakness, poor bilateral knee control)         Walk 150 feet activity   Assist Walk 150 feet activity did not occur: Safety/medical concerns(back pain, bilateral LE weakness, poor bilateral knee control)         Walk 10 feet on uneven surface  activity   Assist Walk 10 feet on uneven surfaces activity did not occur: Safety/medical concerns(back pain, bilateral LE weakness, poor bilateral knee control)         Wheelchair     Assist Will patient use wheelchair at discharge?: Yes Type of Wheelchair: Manual Wheelchair activity did not occur: Safety/medical concerns(Pt could not tolerate sitting in WC for long enough to complete activity due to low back pain)  Wheelchair assist level: Minimal Assistance - Patient > 75% Max wheelchair distance: 79ft    Wheelchair 50 feet with 2 turns activity    Assist    Wheelchair 50 feet with 2 turns activity did not occur: Safety/medical concerns(Pt could not tolerate sitting in WC for long enough to complete activity due to low back pain)       Wheelchair 150 feet activity     Assist  Wheelchair 150 feet activity did not occur: Safety/medical concerns(Pt could not tolerate sitting in WC for long enough to complete activity due to low back pain)       Blood pressure (!) 168/96, pulse (!) 104, temperature  98.4 F (36.9 C), temperature source Oral, resp. rate 18, height 5\' 7"  (1.702 m), weight 61.7 kg, SpO2 100 %.    Medical Problem List and Plan: 1.  Impaired gait with left side weakness secondary to right basal ganglia infarction as well as small cortical infarct left frontal lobe  Bedside therapies until EEG completed  CT head on 12/13 personally reviewed, showing some improvement, MRI personally reviewed, stable  UA unremarkable for infection, urine culture pending  Chest x-ray personally reviewed, unremarkable for infection  Discussed with neurology results of the EEG -findings possibly related to uremia +/- cefepime  Will discuss with ID alternate antibiotics  Cognition better than yesterday  Discussed with son and updated him. 2.  Antithrombotics: -DVT/anticoagulation: Chronic Coumadin for right IJ acute DVT.  Plan anticoagulation x3 months             -antiplatelet therapy: N/A 3. Pain Management chronic back and left hip pain: Neurontin 100 mg nightly, oxycodone as needed.  TLSO back brace for comfort 4. Mood: Provide emotional support             -antipsychotic agents: N/A 5. Neuropsych: This patient is not capable of making decisions on her own behalf. 6. Skin/Wound Care: Routine skin checks 7. Fluids/Electrolytes/Nutrition: Routine in and outs 8.  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  New HD tunneled catheter placed 10/26/2019. 9.  ID/recurrent Pseudomonas  bacteremia.    Continue Maxipime and Cipro as directed with infectious disease to discuss duration of antibiotic therapies anticipate 6 weeks.  Will discuss with ID 10.  Hemodialysis MWF.  Follow-up per renal services.  11.  Diabetes mellitus.  Hemoglobin A1c 7.6.  SSI.  ?  Trending up on 12/15 12.  Hypertension.  Imdur 30 mg daily  Coreg 6.25 mg twice daily, increased to 12.5 on 12/15.    Monitor with increased mobility.  Elevated on  12/15 13.  Acute on chronic anemia.    Hemoglobin 9.5 on 12/15  Continue to monitor 14. Constipation:  Improving  LOS: 6 days A FACE TO FACE EVALUATION WAS PERFORMED  Kaleeah Gingerich Lorie Phenix 11/07/2019, 8:53 AM

## 2019-11-07 NOTE — Progress Notes (Signed)
LTM EEG discontinued - no skin breakdown at unhook.   

## 2019-11-07 NOTE — Progress Notes (Signed)
ANTICOAGULATION CONSULT NOTE - Follow-Up  Pharmacy Consult for Warfarin Indication: RIJ DVT (10/21/19), CVA (10/20/19)  No Known Allergies  Patient Measurements: Height: 5\' 7"  (170.2 cm) Weight: 136 lb 0.4 oz (61.7 kg) IBW/kg (Calculated) : 61.6  Vital Signs: Temp: 98.4 F (36.9 C) (12/15 0445) Temp Source: Oral (12/15 0445) BP: 168/96 (12/15 0445) Pulse Rate: 104 (12/15 0445)  Labs: Recent Labs    11/05/19 0528 11/06/19 0706 11/06/19 1511 11/07/19 0606  HGB 8.5* 8.2*  --  9.5*  HCT 27.7* 26.1*  --  30.2*  PLT 192 212  --  221  LABPROT 29.8* 29.9*  --  29.0*  INR 2.8* 2.9*  --  2.8*  CREATININE  --   --  8.97*  --     Estimated Creatinine Clearance: 5.8 mL/min (A) (by C-G formula based on SCr of 8.97 mg/dL (H)).  Assessment: 68 yr old female with ESRD who presented on 10/20/19 with new CVA and RIJ DVT 10/21/19. Pharmacy consulted to dose Warfarin.   Warfarin started 12/4. Ciprofloxacin stopped 12/14, expect drug interaction with Warfarin to resolve and maintenance dose to increase in the next day.  INR today is therapeutic at 2.8. H/H & plt stable . No bleeding noted.   Goal of Therapy:  INR 2-3 Monitor platelets by anticoagulation protocol: Yes   Plan:  - Warfarin 2 mg PO tonight - Daily PT/INR, CBC q72h - Will continue to monitor for any signs/symptoms of bleeding and will follow up with PT/INR in the a.m.  Thank you for involving pharmacy in this patient's care.  Benetta Spar, PharmD, BCPS, BCCP Clinical Pharmacist  Please check AMION for all [facility] phone numbers After 10:00 PM, call [place] [phone]

## 2019-11-07 NOTE — Progress Notes (Signed)
Speech Language Pathology Daily Session Note  Patient Details  Name: [name] MRN: [id number] Date of Birth: 11-Dec-1950  Today's Date: 11/07/2019 SLP Individual Time: 1000-1057 SLP Individual Time Calculation (min): 57 min  Short Term Goals: Week 1: SLP Short Term Goal 1 (Week 1): Pt will demonstrate functional problem solving during basic tasks with Min A verbal/visual cues SLP Short Term Goal 2 (Week 1): Pt will demonstrate recall of day to day/new information with Min A verbal cues for use of compensatory memory strategies SLP Short Term Goal 3 (Week 1): Pt will selectively attend to functional tasks with Min A verbal cues for redirection SLP Short Term Goal 4 (Week 1): Pt will demonstrate anticipatory awareness by stating 2 ADLs she will need assistance with upon her return home with Min A verbal/visual cues  Skilled Therapeutic Interventions: Pt was seen for skilled ST targeting cognitive goals. Pt was independently oriented to self, place, and month. Pt slightly disoriented to date in December and voiced interest in a calendar to assist with orientation throughout stay - SLP posted calendar in pt's room. Pt required Mod A verbal cues for redirection to tasks in a quiet controlled environment, as pt was distracted by back pain and slightly restless. SLP also provided fluctuating Mod-Max A verbal cues for problem solving to sort and count money during a basic money management activity (ALFA). Pt's son was present at bedside and reported while he recognizes impairments in attention and problem solving still evident, her orientation and overall cognitive presentation is improved in comparison to yesterday. He was receptive to [employer] education regarding impact of attention on overall function and participation in therapy. Prior to end of session pt demonstrated use of call bell with Min A question cues. Pt left laying in bed with alarm set and all needs within reach, son still present.  Continue per current plan of care.        Pain Pain Assessment Pain Scale: Faces Faces Pain Scale: Hurts little more Pain Type: Chronic pain Pain Location: Back Pain Orientation: Lower Pain Descriptors / Indicators: Grimacing Pain Onset: On-going Pain Intervention(s): Repositioned Multiple Pain Sites: No  Therapy/Group: Individual Therapy  Arbutus Leas 11/07/2019, 11:18 AM

## 2019-11-07 NOTE — Progress Notes (Signed)
Patient yelling out at intervals throughout shift, noted confusion and disorientation, attempts to reorient with some calmness noted, able to consume some liquid juices per request. States that " someone is trying to steal my organs", fearfulness in tone of voice, reassurance and support provided, monitor and assisted. EEG monitoring continues

## 2019-11-08 ENCOUNTER — Inpatient Hospital Stay (HOSPITAL_COMMUNITY): Payer: Medicare PPO | Admitting: Speech Pathology

## 2019-11-08 ENCOUNTER — Inpatient Hospital Stay (HOSPITAL_COMMUNITY): Payer: Medicare PPO

## 2019-11-08 DIAGNOSIS — I76 Septic arterial embolism: Secondary | ICD-10-CM

## 2019-11-08 DIAGNOSIS — B379 Candidiasis, unspecified: Secondary | ICD-10-CM

## 2019-11-08 DIAGNOSIS — I058 Other rheumatic mitral valve diseases: Secondary | ICD-10-CM

## 2019-11-08 LAB — CBC
HCT: 27.9 % — ABNORMAL LOW (ref 36.0–46.0)
Hemoglobin: 8.8 g/dL — ABNORMAL LOW (ref 12.0–15.0)
MCH: 29.1 pg (ref 26.0–34.0)
MCHC: 31.5 g/dL (ref 30.0–36.0)
MCV: 92.4 fL (ref 80.0–100.0)
Platelets: 211 10*3/uL (ref 150–400)
RBC: 3.02 MIL/uL — ABNORMAL LOW (ref 3.87–5.11)
RDW: 16.4 % — ABNORMAL HIGH (ref 11.5–15.5)
WBC: 7.8 10*3/uL (ref 4.0–10.5)
nRBC: 0.4 % — ABNORMAL HIGH (ref 0.0–0.2)

## 2019-11-08 LAB — RENAL FUNCTION PANEL
Albumin: 2.2 g/dL — ABNORMAL LOW (ref 3.5–5.0)
Anion gap: 14 (ref 5–15)
BUN: 38 mg/dL — ABNORMAL HIGH (ref 8–23)
CO2: 24 mmol/L (ref 22–32)
Calcium: 8.8 mg/dL — ABNORMAL LOW (ref 8.9–10.3)
Chloride: 90 mmol/L — ABNORMAL LOW (ref 98–111)
Creatinine, Ser: 7.32 mg/dL — ABNORMAL HIGH (ref 0.44–1.00)
GFR calc Af Amer: 6 mL/min — ABNORMAL LOW (ref >60)
GFR calc non Af Amer: 5 mL/min — ABNORMAL LOW (ref >60)
Glucose, Bld: 200 mg/dL — ABNORMAL HIGH (ref 70–99)
Phosphorus: 4.7 mg/dL — ABNORMAL HIGH (ref 2.5–4.6)
Potassium: 4.7 mmol/L (ref 3.5–5.1)
Sodium: 128 mmol/L — ABNORMAL LOW (ref 135–145)

## 2019-11-08 LAB — GLUCOSE, CAPILLARY
Glucose-Capillary: 126 mg/dL — ABNORMAL HIGH (ref 70–99)
Glucose-Capillary: 219 mg/dL — ABNORMAL HIGH (ref 70–99)
Glucose-Capillary: 123 mg/dL — ABNORMAL HIGH (ref 70–99)
Glucose-Capillary: 212 mg/dL — ABNORMAL HIGH (ref 70–99)

## 2019-11-08 LAB — PROTIME-INR
INR: 2.9 — ABNORMAL HIGH (ref 0.8–1.2)
Prothrombin Time: 30 seconds — ABNORMAL HIGH (ref 11.4–15.2)

## 2019-11-08 MED ORDER — PENTAFLUOROPROP-TETRAFLUOROETH EX AERO: 1.0000 "application " | INHALATION_SPRAY | CUTANEOUS | Status: DC | PRN

## 2019-11-08 MED ORDER — HEPARIN SODIUM (PORCINE) 1000 UNIT/ML DIALYSIS
20.0000 [IU]/kg | INTRAMUSCULAR | Status: DC | PRN
  Filled 2019-11-08 (×2): qty 2

## 2019-11-08 MED ORDER — LIDOCAINE HCL (PF) 1 % IJ SOLN
5.0000 mL | INTRAMUSCULAR | Status: DC | PRN
  Filled 2019-11-08: qty 5

## 2019-11-08 MED ORDER — MANAGING BACK PAIN BOOK
Freq: Once | Status: AC
  Filled 2019-11-08: qty 1

## 2019-11-08 MED ORDER — SODIUM CHLORIDE 0.9 % IV SOLN: 100.0000 mL | INTRAVENOUS | Status: DC | PRN

## 2019-11-08 MED ORDER — FLUCONAZOLE 100 MG PO TABS
100.0000 mg | ORAL_TABLET | ORAL | Status: DC
  Administered 2019-11-08: 100 mg via ORAL
  Filled 2019-11-08: qty 1

## 2019-11-08 MED ORDER — DOXERCALCIFEROL 4 MCG/2ML IV SOLN
INTRAVENOUS | Status: AC
  Administered 2019-11-08: 16:00:00 1.5 ug via INTRAVENOUS
  Filled 2019-11-08: qty 2

## 2019-11-08 MED ORDER — ONDANSETRON HCL 4 MG PO TABS
4.0000 mg | ORAL_TABLET | Freq: Three times a day (TID) | ORAL | Status: DC | PRN
  Administered 2019-11-09 – 2019-11-10 (×2): 4 mg via ORAL
  Filled 2019-11-08 (×4): qty 1

## 2019-11-08 MED ORDER — LIDOCAINE-PRILOCAINE 2.5-2.5 % EX CREA
1.0000 "application " | TOPICAL_CREAM | CUTANEOUS | Status: DC | PRN
  Filled 2019-11-08: qty 5

## 2019-11-08 MED ORDER — HEPARIN SODIUM (PORCINE) 1000 UNIT/ML IJ SOLN
INTRAMUSCULAR | Status: AC
  Administered 2019-11-08: 3200 [IU] via INTRAVENOUS_CENTRAL
  Filled 2019-11-08: qty 4

## 2019-11-08 MED ORDER — HEPARIN SODIUM (PORCINE) 1000 UNIT/ML DIALYSIS
1000.0000 [IU] | INTRAMUSCULAR | Status: DC | PRN
  Filled 2019-11-08 (×2): qty 1

## 2019-11-08 MED ORDER — ALTEPLASE 2 MG IJ SOLR: 2.0000 mg | Freq: Once | INTRAMUSCULAR | Status: DC | PRN

## 2019-11-08 MED ORDER — LIVING WELL WITH DIABETES BOOK
Freq: Once | Status: AC
  Filled 2019-11-08: qty 1

## 2019-11-08 MED ORDER — HEPARIN SODIUM (PORCINE) 1000 UNIT/ML IJ SOLN
INTRAMUSCULAR | Status: AC
  Administered 2019-11-08: 16:00:00 1200 [IU] via INTRAVENOUS_CENTRAL
  Filled 2019-11-08: qty 2

## 2019-11-08 NOTE — Progress Notes (Signed)
Speech Language Pathology Daily Session Note  Patient Details  Name: [name] MRN: [id number] Date of Birth: 08-21-1951  Today's Date: 11/08/2019 SLP Individual Time: 0730-0827 SLP Individual Time Calculation (min): 57 min  Short Term Goals: Week 1: SLP Short Term Goal 1 (Week 1): Pt will demonstrate functional problem solving during basic tasks with Min A verbal/visual cues SLP Short Term Goal 2 (Week 1): Pt will demonstrate recall of day to day/new information with Min A verbal cues for use of compensatory memory strategies SLP Short Term Goal 3 (Week 1): Pt will selectively attend to functional tasks with Min A verbal cues for redirection SLP Short Term Goal 4 (Week 1): Pt will demonstrate anticipatory awareness by stating 2 ADLs she will need assistance with upon her return home with Min A verbal/visual cues  Skilled Therapeutic Interventions: Pt was seen for skilled ST targeting cognition. SLP facilitated session with Max A for self feeding Mod A for initiation and basic problem solving during breakfast meal. She reported back pain and Min A verbal cues were provided for problem solving to use call bell in order to request pain medication (at her request). Pt independently oriented to place and month, however Mod A verbal and visual cues required to orient to date on a calendar. Pt engaged in a basic sorting task (by color - field of 6) with Supervision A verbal cues for error awareness and problem solving. Pt demonstrated improved ability to selectively attend to tasks with Min A verbal cues for redirection today (in comparison to yesterday). However, pt continues to require Mod-Max A verbal cues for short term day-to-day recall. SLP re-created pt's therapy schedule using bold dark print and provided Min-Mod A verbal cues for pt to use schedule to determine appointment times and anticipate the day. Communicated with RN regarding need for significant assist with self-feeding due to  visual and motor disturbances/changes. Pt left sitting in wheelchair with seatbelt alarm in place and all needs within reach.       Pain Pain Assessment Pain Scale: Faces Pain Score: 6  Faces Pain Scale: Hurts little more Pain Type: Chronic pain Pain Location: Back Pain Orientation: Lower Pain Descriptors / Indicators: Grimacing Pain Onset: On-going Patients Stated Pain Goal: 3 Pain Intervention(s): RN made aware;Medication (See eMAR) Multiple Pain Sites: No  Therapy/Group: Individual Therapy  Arbutus Leas 11/08/2019, 9:36 AM

## 2019-11-08 NOTE — Progress Notes (Signed)
[facility] Progress Note   Dialysis Orders: [facility]  MWF3.5 hours TDC - mult failed accesses Profile 1 - pressure drops very easily;max is pull 1.1 an hour; do not pull rinseback revaclear 300 dialyzer 400/600 2 K 2.5 Ca EDW 62.5  Epo 1200 U q HD; iron 50 mg/weekly  1.5 mcg hectoral each tx  Assessment/Plan: 1.  Recurrent Pseudomonas bacteremia (Likely HD CRB) with evidence of mitral valve endocarditis that is post [facility] exchange for infection focus control.  Seen by cardiothoracic surgery and no imminent plan for surgical intervention-this will be reevaluated as an outpatient following completion of antibiotic therapy with cefepime; now off cipro as of 12/14 2. ESRD: MWF - on HD - labs pending - starting on 2 K bath 3. Anemia: Hgb 8.9 > 8.8  had 100 Aranesp ordered for 12/9 but don't think it was given - on low dose Epo prior to admission -  started 60 Aran esp 12/14 - tsat 15% ferritin 1183 -- no Fe for the short term due to currently being treatment for infection. 4. CKD-MBD: Phosphorus level within acceptable range, will continue to monitor on renal diet/binders, on VDRA -  5.  Acute CVA with evidence of chronic ischemia/old infarcts: Seen by neurology-suspected embolic infarct from SBE.  Ongoing inpatient rehabilitation for management of neurological deficits. Repeat MRI should noacute changes 6. Hypertension/volume :   Keep SBP < 180 per neuro. - net UF 2 L Friday and 1 L Monday  Na low Monday - will need more volume off next HD  CXR 12/14 - showed some pul vasc congestion starting with goal 3 L 7. Nutrition - diet liberalized to regular - has supplements/multivits ordered - alb very low 8. MS change since 12/13 - no obvious findings on work up  Per notes may have been cefepime related - seem back to baseline today.  Neurontin 100 mg per day stopped 12/14 9. Yeast UTI > 80 K- diflucan started 12/16  Myriam Jacobson, PA-C [facility] Beeper  [phone] 11/08/2019,12:48 PM  LOS: 7 days   Subjective:   Sleepy by rousable with clear speech Objective Vitals:   11/07/19 1450 11/07/19 2009 11/07/19 2017 11/08/19 0527  BP: (!) 164/81 133/73  (!) 152/91  Pulse: 93 (!) 111 99 87  Resp: 17 19  16   Temp: 98.3 F (36.8 C) 98.6 F (37 C)  98 F (36.7 C)  TempSrc:    Oral  SpO2: 96% 99% 100% 100%  Weight:      Height:       Physical Exam General: NAD on HD  Heart: RRR Lungs: grossly clear anteriorly Abdomen: soft NT Extremities: no edema Dialysis Access: right IJ [facility] Neuro: mentation seems back near baseline just sleepy   Additional Objective Labs: Basic Metabolic Panel: Recent Labs  Lab 11/03/19 1320 11/06/19 1511 11/07/19 0933  NA 133* 128* 129*  K 4.8 5.9* 4.7  CL 93* 90* 93*  CO2 28 24 26   GLUCOSE 254* 146* 128*  BUN 38* 48* 23  CREATININE 6.71* 8.97* 5.23*  CALCIUM 9.3 9.3 8.7*  PHOS 2.8 3.9 3.5   Liver Function Tests: Recent Labs  Lab 11/03/19 1320 11/06/19 1511 11/07/19 0933  AST  --  18  --   ALT  --  12  --   ALKPHOS  --  86  --   BILITOT  --  0.2*  --   PROT  --  7.5  --   ALBUMIN 1.9* 2.1* 2.0*  No results for input(s): LIPASE, AMYLASE in the last 168 hours. CBC: Recent Labs  Lab 11/04/19 0546 11/05/19 0528 11/06/19 0706 11/07/19 0606 11/08/19 0510  WBC 8.3 7.7 7.1 9.1 7.8  HGB 8.9* 8.5* 8.2* 9.5* 8.8*  HCT 28.8* 27.7* 26.1* 30.2* 27.9*  MCV 93.8 94.5 92.6 92.4 92.4  PLT 195 192 212 221 211   Blood Culture    Component Value Date/Time   SDES URINE, RANDOM 11/06/2019 1107   SPECREQUEST  11/06/2019 1107    NONE Performed at [facility], [place] [address], [place], [place] [address]    CULT 80,000 COLONIES/mL YEAST (A) 11/06/2019 1107   REPTSTATUS 11/07/2019 FINAL 11/06/2019 1107    Cardiac Enzymes: No results for input(s): CKTOTAL, CKMB, CKMBINDEX, TROPONINI in the last 168 hours. CBG: Recent Labs  Lab 11/07/19 1320 11/07/19 1637 11/07/19 2200 11/08/19 0628  11/08/19 1111  GLUCAP 161* 182* 215* 126* 219*   Iron Studies:  Recent Labs    11/07/19 0606  IRON 36  TIBC 227*  FERRITIN 605*   Lab Results  Component Value Date   INR 2.9 (H) 11/08/2019   INR 2.8 (H) 11/07/2019   INR 2.9 (H) 11/06/2019   Studies/Results: EEG  Result Date: 11/06/2019 Lora Havens, MD     11/06/2019  4:56 PM Patient Name: Avanell Cajina MRN: [id number] Epilepsy Attending: Lora Havens Referring Physician/Provider: Dr Zeb Comfort Date: 11/06/2019 Duration: 21.47 mins Patient history: 68yo F with subacute strokes, no with ams and twitching. EEG to evaluate for seizure Level of alertness: lethargic/awake AEDs during EEG study: None Technical aspects: This EEG study was done with scalp electrodes positioned according to the 10-20 International system of electrode placement. Electrical activity was acquired at a sampling rate of 500Hz  and reviewed with a high frequency filter of 70Hz  and a low frequency filter of 1Hz . EEG data were recorded continuously and digitally stored. DESCRIPTION: EEG showed continuous generalized 3-5hz  theta-delta slowing. Triphasic waves, generalized, maximal bifrontal, at 1.5-2hz  were also noted. Hyperventilation and photic stimulation were not performed. Patient was noted to have diffuse twitching, pronounced when patient was stimulated without eeg change. ABNORMALITY - Triphasic waves, generalized - Continuous slow, generalized IMPRESSION: This study is suggestive of moderate to severe diffuse encephalopathy, non specific to etiology but could be secondary to toxic-metabolic causes, cefepime toxicity. No seizures or definite epileptiform discharges were seen throughout the recording. Patient was noted to have diffuse jerking, more pronounces when patient was stimulated without concomitant eeg change. Therefore, this is likely myoclonus in setting of uremia, cefepime use. Priyanka [name]   Overnight EEG with video  Result Date:  11/07/2019 Lora Havens, MD     11/07/2019 10:53 AM Patient Name: Jocilyn Mossholder MRN: [id number] Epilepsy Attending: Lora Havens Referring Physician/Provider: Dr Zeb Comfort Duration: 11/06/2019 1649 to 11/07/2019 1003  Patient history: 68yo F with subacute strokes, no with ams and twitching. EEG to evaluate for seizure  Level of alertness: lethargic/awake, asleep  AEDs during EEG study: None  Technical aspects: This EEG study was done with scalp electrodes positioned according to the 10-20 International system of electrode placement. Electrical activity was acquired at a sampling rate of 500Hz  and reviewed with a high frequency filter of 70Hz  and a low frequency filter of 1Hz . EEG data were recorded continuously and digitally stored.  DESCRIPTION: EEG showed continuous generalized 3-5hz  theta-delta slowing. Triphasic waves, generalized, maximal bifrontal, at 1.5-2hz  were also noted. Hyperventilation and photic stimulation were not performed.  ABNORMALITY -  Triphasic waves, generalized - Continuous slow, generalized  IMPRESSION: This study is suggestive of moderate to severe diffuse encephalopathy, non specific to etiology but could be secondary to toxic-metabolic causes, cefepime toxicity. No seizures or definite epileptiform discharges were seen throughout the recording.  Priyanka [name]   Medications: . ceFEPime (MAXIPIME) IV 1 g (11/07/19 2018)   . acetaminophen  500 mg Oral TID  . calcium carbonate  1 tablet Oral 2 times per day  . carvedilol  12.5 mg Oral BID  . Chlorhexidine Gluconate Cloth  6 each Topical BID  . Chlorhexidine Gluconate Cloth  6 each Topical Q0600  . darbepoetin (ARANESP) injection - DIALYSIS  60 mcg Intravenous Q Mon-HD  . doxercalciferol  1.5 mcg Intravenous Q M,W,F-HD  . feeding supplement (ENSURE ENLIVE)  237 mL Oral BID BM  . fluconazole  400 mg Oral Daily  . insulin aspart  0-9 Units Subcutaneous TID WC  . isosorbide mononitrate  30 mg Oral QPC  lunch  . living well with diabetes book   Does not apply Once  . managing back pain book   Does not apply Once  . multivitamin  1 tablet Oral QPC lunch  . senna-docusate  1 tablet Oral QHS  . Warfarin - Pharmacist Dosing Inpatient   Does not apply [phone]

## 2019-11-08 NOTE — Progress Notes (Signed)
Vomited ~450 cc undigested food, nausea subsided p event and pt feeling better, had just eaten some of a muffin and drank juice/ensure however liquid that came back did not look like either. LBM 12/14; reviewed medication for constipation after return from HD today. Pt denied dizziness, just pain in lower back prior to event and was given scheduled tylenol. Alcide Goodness PA notified and received order for Zofran prn. Margarito Liner

## 2019-11-08 NOTE — Plan of Care (Signed)
  Problem: RH Bed to Chair Transfers Goal: LTG Patient will perform bed/chair transfers w/assist (PT) Description: LTG: Patient will perform bed to chair transfers with assistance (PT). Flowsheets (Taken 11/08/2019 0734) LTG: Pt will perform Bed to Chair Transfers with assistance level: (downgraded due to bilateral LE weakness, fatigue, and increased pain with mobility) Contact Guard/Touching assist Note: downgraded due to bilateral LE weakness, fatigue, and increased pain with mobility   Problem: RH Car Transfers Goal: LTG Patient will perform car transfers with assist (PT) Description: LTG: Patient will perform car transfers with assistance (PT). Flowsheets (Taken 11/08/2019 0734) LTG: Pt will perform car transfers with assist:: (downgraded due to bilateral LE weakness, fatigue, and increased pain with mobility) Contact Guard/Touching assist Note: downgraded due to bilateral LE weakness, fatigue, and increased pain with mobility   Problem: RH Stairs Goal: LTG Patient will ambulate up and down stairs w/assist (PT) Description: LTG: Patient will ambulate up and down # of stairs with assistance (PT) Flowsheets (Taken 11/08/2019 0734) LTG: Pt will ambulate up/down stairs assist needed:: (downgraded due to bilateral LE weakness, fatigue, and increased pain with mobility) Minimal Assistance - Patient > 75% LTG: Pt will  ambulate up and down number of stairs: 3 staits with 2 rails Note: downgraded due to bilateral LE weakness, fatigue, and increased pain with mobility

## 2019-11-08 NOTE — Patient Care Conference (Signed)
Inpatient RehabilitationTeam Conference and Plan of Care Update Date: 11/08/2019   Time: 11:50 AM   Patient Name: [name]      Medical Record Number: [id number]  Date of Birth: [birth year] Sex: Female         Room/Bed: 4W12C/4W12C-01 Payor Info: Payor: [employer] MEDICARE / Plan: [employer] MEDICARE CHOICE PPO / Product Type: *No Product type* /    Admit Date/Time:  11/01/2019  5:11 PM  Primary Diagnosis:  Cerebrovascular accident (CVA) of right basal ganglia [facility])  Patient Active Problem List   Diagnosis Date Noted  . Candidiasis   . Endocarditis of mitral valve   . Septic embolism ([place])   . Seizure ([place])   . Slow transit constipation   . Acute on chronic anemia   . Labile blood pressure   . Labile blood glucose   . Diabetes mellitus type 2 in nonobese (HCC)   . Bacteremia   . Cerebrovascular accident (CVA) of right basal ganglia ([place]) 11/01/2019  . Acute bacterial endocarditis   . Acute left-sided weakness   . Acute ischemic stroke ([place]) 10/20/2019  . Hyperkalemia 09/10/2019  . End-stage renal disease on hemodialysis ([place])   . Nausea vomiting and diarrhea   . Bacteremia due to Pseudomonas 07/20/2019  . ESRD needing dialysis ([place]) 05/15/2019  . Anemia of chronic renal failure 04/03/2018  . Essential hypertension 04/03/2018  . Type 2 diabetes mellitus with diabetic nephropathy ([place]) 04/03/2018    Expected Discharge Date: Expected Discharge Date: (3weeks-set DC date next week)  Team Members Present: Physician leading conference: Dr. Delice Lesch Social Worker Present: Ovidio Kin, LCSW Nurse Present: Dorien Chihuahua, RN Case Manager: Karene Fry, RN PT Present: Becky Sax, PT SLP Present: Jettie Booze, CF-SLP PPS Coordinator present : Gunnar Fusi, SLP     Current Status/Progress Goal Weekly Team Focus  Bowel/Bladder   Patient is ESRD, oliguria,LBM 11/06/19,continue daily and prn regime  maintain continence, continue HD X3 weekly      Swallow/Nutrition/  Hydration             ADL's   Max assist transfers, Max assist ADLs, noted increased "jerkiness" in [place] on 12/15  Supervision, will most likely have to downgrade  ADL retraining, activity tolerance, pain management, BUE NMR, pt/family education   Mobility   bed mobility mod/max A, sitting balance max A, transfers max A/+2 assist  Supervision CGA for ambulation and stairs  bed mobility, transfers, LE strength, sitting balance, improved activity tolerance   Communication             Safety/Cognition/ Behavioral Observations  Mod-Max A attention, basic problem solving, recall  Supervision A  basic problem solving, day to day recall and use of compensatory strategies, susatined and selective attention   Pain   lower chronic back pain, on schedule Tylenol and prn Oxycodone 5mg  q 6 hrs  <3  Assess QS/PRN   Skin   Skin intact and dry, no skin breakdown or abrasion. Upper chest HD catheter intact and unremarkable  Maintain skin integrity, no skin issues  QS/PRN assessment and monitoring of HD cath site, docunent and evaluate QS      *See Care Plan and progress notes for long and short-term goals.     Barriers to Discharge  Current Status/Progress Possible Resolutions Date Resolved   Nursing                  PT  Medical stability  OT                  SLP                SW                Discharge Planning/Teaching Needs:  Home with son who is currenlty working from home. Medical issues this week and very limited participation in therapies due to this. Son here daily and talking with MD      Team Discussion: Nephrology/neurology following, HD today, confusion.  RN DM education started, N/V, zofran given spastic movement.  PT max transfers, limited participation, goals S, increased jerkiness.  PT mod/max bed and transfers, S goals, may downgrade goals.  SLP cognition goals S, currently mod/max A now.  Will go home with son.   Revisions to Treatment Plan: N/A      Medical Summary Current Status: Impaired gait with left side weakness secondary to right basal ganglia infarction as well as small cortical infarct left frontal lobe Weekly Focus/Goal: Improve mobility, cognition, DM/BP, bacteremia, candidiasis  Barriers to Discharge: Hemodialysis;Medical stability;IV antibiotics   Possible Resolutions to Barriers: Therapies, follow ID/Neuro/Nephro recs, optimize DM/HTN meds, IV abx   Continued Need for Acute Rehabilitation Level of Care: The patient requires daily medical management by a physician with specialized training in physical medicine and rehabilitation for the following reasons: Direction of a multidisciplinary physical rehabilitation program to maximize functional independence : Yes Medical management of patient stability for increased activity during participation in an intensive rehabilitation regime.: Yes Analysis of laboratory values and/or radiology reports with any subsequent need for medication adjustment and/or medical intervention. : Yes   I attest that I was present, lead the team conference, and concur with the assessment and plan of the team.   [name] 11/08/2019, 8:44 PM  Team conference was held via web/ teleconference due to [place] - 19

## 2019-11-08 NOTE — Progress Notes (Signed)
ANTICOAGULATION CONSULT NOTE - Follow-Up  Pharmacy Consult for Warfarin Indication: RIJ DVT (10/21/19), CVA (10/20/19)  No Known Allergies  Patient Measurements: Height: 5\' 7"  (170.2 cm) Weight: 136 lb 0.4 oz (61.7 kg) IBW/kg (Calculated) : 61.6  Vital Signs: Temp: 98 F (36.7 C) (12/16 0527) Temp Source: Oral (12/16 0527) BP: 152/91 (12/16 0527) Pulse Rate: 87 (12/16 0527)  Labs: Recent Labs    11/06/19 0706 11/06/19 1511 11/07/19 0606 11/07/19 0933 11/08/19 0510 11/08/19 1254  HGB 8.2*  --  9.5*  --  8.8*  --   HCT 26.1*  --  30.2*  --  27.9*  --   PLT 212  --  221  --  211  --   LABPROT 29.9*  --  29.0*  --  30.0*  --   INR 2.9*  --  2.8*  --  2.9*  --   CREATININE  --  8.97*  --  5.23*  --  7.32*    Estimated Creatinine Clearance: 7.1 mL/min (A) (by C-G formula based on SCr of 7.32 mg/dL (H)).  Assessment: 68 yr old female with ESRD who presented on 10/20/19 with new CVA and RIJ DVT 10/21/19. Pharmacy consulted to dose Warfarin.   Warfarin started 12/4. Ciprofloxacin stopped 12/14, expect drug interaction with Warfarin to resolve and maintenance dose to increase in the next day. Fluconazole starting 12/16  INR today is therapeutic at 2.9. H/H & plt stable . No bleeding noted.   Goal of Therapy:  INR 2-3 Monitor platelets by anticoagulation protocol: Yes   Plan:  - HOLD Warfarin tonight - F/u fluconazole duration given major interaction with warfarin - Daily PT/INR, CBC q72h - Will continue to monitor for any signs/symptoms of bleeding and will follow up with PT/INR in the a.m.  Thank you for involving pharmacy in this patient's care.  Benetta Spar, PharmD, BCPS, BCCP Clinical Pharmacist  Please check AMION for all [place] phone numbers After 10:00 PM, call [place] [phone]

## 2019-11-08 NOTE — Progress Notes (Signed)
[name] PHYSICAL MEDICINE & REHABILITATION PROGRESS NOTE   Subjective/Complaints: Patient seen sitting up in her chair working with therapy this morning.  She states she slept well overnight.  Discussed confusion and deficits with therapies.  Patient was seen by nephrology, neurology, ID yesterday, notes reviewed.  ROS: Limited due to cognition/language.  Objective:   EEG  Result Date: 11/06/2019 Diana Havens, MD     11/06/2019  4:56 PM Patient Name: [name] MRN: [id number] Epilepsy Attending: [name] Referring Physician/Provider: Dr [name] Date: 11/06/2019 Duration: 21.47 mins Patient history: 68yo F with subacute strokes, no with ams and twitching. EEG to evaluate for seizure Level of alertness: lethargic/awake AEDs during EEG study: None Technical aspects: This EEG study was done with scalp electrodes positioned according to the 10-20 International system of electrode placement. Electrical activity was acquired at a sampling rate of 500Hz  and reviewed with a high frequency filter of 70Hz  and a low frequency filter of 1Hz . EEG data were recorded continuously and digitally stored. DESCRIPTION: EEG showed continuous generalized 3-5hz  theta-delta slowing. Triphasic waves, generalized, maximal bifrontal, at 1.5-2hz  were also noted. Hyperventilation and photic stimulation were not performed. Patient was noted to have diffuse twitching, pronounced when patient was stimulated without eeg change. ABNORMALITY - Triphasic waves, generalized - Continuous slow, generalized IMPRESSION: This study is suggestive of moderate to severe diffuse encephalopathy, non specific to etiology but could be secondary to toxic-metabolic causes, cefepime toxicity. No seizures or definite epileptiform discharges were seen throughout the recording. Patient was noted to have diffuse jerking, more pronounces when patient was stimulated without concomitant eeg change. Therefore, this is likely  myoclonus in setting of uremia, cefepime use. [name]   MR BRAIN WO CONTRAST  Result Date: 11/06/2019 CLINICAL DATA:  Neuro deficit, acute, stroke suspected. Additional history provided: Acute CVA with evidence of chronic ischemia/old infarcts. EXAM: MRI HEAD WITHOUT CONTRAST TECHNIQUE: Multiplanar, multiecho pulse sequences of the brain and surrounding structures were obtained without intravenous contrast. COMPARISON:  Head CT 11/05/2019 FINDINGS: Brain: Redemonstrated now subacute infarct within the right caudate head with mild associated chronic blood products. There is persistent although decreased restricted diffusion at site of previously demonstrated now subacute right corona radiata/basal ganglia infarcts. Vague restricted diffusion also persists at site of a subacute left frontal centrum semiovale subacute infarct. Restricted diffusion also remains at site of several additional left frontoparietal centrum semiovale punctate subacute infarcts more posteriorly, none of which are definitively new from prior MRI 10/20/2019. A small chronic cortical infarct within the left post central gyrus was better appreciated on prior MRI. Background of mild generalized parenchymal atrophy and chronic small vessel ischemic disease. Redemonstrated chronic lacunar infarcts within the left thalamus and left cerebellum. No midline shift or extra-axial fluid collection. There are a few scattered supratentorial foci of microhemorrhage Vascular: Flow voids maintained within the proximal large arterial vessels. Skull and upper cervical spine: Generalized decreased T1 marrow signal, likely reflecting renal osteodystrophy, unchanged. Sinuses/Orbits: Right lens replacement. Mild scattered paranasal sinus mucosal thickening. No significant mastoid effusion. IMPRESSION: Redemonstrated are multiple subacute infarcts within the right corona radiata/basal ganglia and left centrum semiovale. No definitively new infarct is  identified. Redemonstrated small chronic cortical infarct within the left post central gyrus. Background of mild generalized parenchymal atrophy and chronic small vessel ischemic disease. Redemonstrated chronic lacunar infarcts within the left thalamus and left cerebellum. Electronically Signed   By: Diana Simmering DO   On: 11/06/2019 12:41   Overnight EEG with video  Result Date: 11/07/2019 Diana Havens, MD     11/07/2019 10:53 AM Patient Name: [name] MRN: [id number] Epilepsy Attending: [name] Referring Physician/Provider: Dr [name] Duration: 11/06/2019 1649 to 11/07/2019 1003  Patient history: 68yo F with subacute strokes, no with ams and twitching. EEG to evaluate for seizure  Level of alertness: lethargic/awake, asleep  AEDs during EEG study: None  Technical aspects: This EEG study was done with scalp electrodes positioned according to the 10-20 International system of electrode placement. Electrical activity was acquired at a sampling rate of 500Hz  and reviewed with a high frequency filter of 70Hz  and a low frequency filter of 1Hz . EEG data were recorded continuously and digitally stored.  DESCRIPTION: EEG showed continuous generalized 3-5hz  theta-delta slowing. Triphasic waves, generalized, maximal bifrontal, at 1.5-2hz  were also noted. Hyperventilation and photic stimulation were not performed.  ABNORMALITY - Triphasic waves, generalized - Continuous slow, generalized  IMPRESSION: This study is suggestive of moderate to severe diffuse encephalopathy, non specific to etiology but could be secondary to toxic-metabolic causes, cefepime toxicity. No seizures or definite epileptiform discharges were seen throughout the recording.  [name]   Recent Labs    11/07/19 0606 11/08/19 0510  WBC 9.1 7.8  HGB 9.5* 8.8*  HCT 30.2* 27.9*  PLT 221 211   Recent Labs    11/06/19 1511 11/07/19 0933  NA 128* 129*  K 5.9* 4.7  CL 90* 93*  CO2 24 26  GLUCOSE  146* 128*  BUN 48* 23  CREATININE 8.97* 5.23*  CALCIUM 9.3 8.7*    Intake/Output Summary (Last 24 hours) at 11/08/2019 1048 Last data filed at 11/07/2019 1836 Gross per 24 hour  Intake 300 ml  Output --  Net 300 ml     Physical Exam: Vital Signs Blood pressure (!) 152/91, pulse 87, temperature 98 F (36.7 C), temperature source Oral, resp. rate 16, height 5\' 7"  (1.702 m), weight 61.7 kg, SpO2 100 %. Constitutional: No distress . Vital signs reviewed. HENT: Normocephalic.  Atraumatic. Eyes: EOMI. No discharge. Cardiovascular: No JVD. Respiratory: Normal effort.  No stridor. GI: Non-distended. Skin: Warm and dry.  Intact. Psych: Confused.  Perseverative. Musc: No edema in extremities.  No tenderness in extremities. Neurological: Alert and oriented x 1 Motor: RUE: 5/5 proximal to distal RLE: HF, KE 3/5, ADF 4/5 LUE: 4/5 proximal to distal LLE: HF, KE 3/5, ADF 3+/5  Assessment/Plan: 1. Functional deficits secondary to right basal ganglia stroke which require 3+ hours per day of interdisciplinary therapy in a comprehensive inpatient rehab setting.  Physiatrist is providing close team supervision and 24 hour management of active medical problems listed below.  Physiatrist and rehab team continue to assess barriers to discharge/monitor patient progress toward functional and medical goals  Care Tool:  Bathing    Body parts bathed by patient: Face, Left arm, Chest, Abdomen   Body parts bathed by helper: Right arm     Bathing assist Assist Level: Minimal Assistance - Patient > 75%     Upper Body Dressing/Undressing Upper body dressing   What is the patient wearing?: Hospital gown only    Upper body assist Assist Level: Minimal Assistance - Patient > 75%    Lower Body Dressing/Undressing Lower body dressing      What is the patient wearing?: Pants     Lower body assist Assist for lower body dressing: Moderate Assistance - Patient 50 - 74%      Toileting Toileting    Toileting assist Assist for  toileting: Minimal Assistance - Patient > 75%     Transfers Chair/bed transfer  Transfers assist     Chair/bed transfer assist level: Maximal Assistance - Patient 25 - 49%     Locomotion Ambulation   Ambulation assist   Ambulation activity did not occur: Safety/medical concerns(back pain, bilateral LE weakness, poor bilateral knee control)          Walk 10 feet activity   Assist  Walk 10 feet activity did not occur: Safety/medical concerns(back pain, bilateral LE weakness, poor bilateral knee control)        Walk 50 feet activity   Assist Walk 50 feet with 2 turns activity did not occur: Safety/medical concerns(back pain, bilateral LE weakness, poor bilateral knee control)         Walk 150 feet activity   Assist Walk 150 feet activity did not occur: Safety/medical concerns(back pain, bilateral LE weakness, poor bilateral knee control)         Walk 10 feet on uneven surface  activity   Assist Walk 10 feet on uneven surfaces activity did not occur: Safety/medical concerns(back pain, bilateral LE weakness, poor bilateral knee control)         Wheelchair     Assist Will patient use wheelchair at discharge?: Yes Type of Wheelchair: Manual Wheelchair activity did not occur: Safety/medical concerns(Pt could not tolerate sitting in WC for long enough to complete activity due to low back pain)  Wheelchair assist level: Minimal Assistance - Patient > 75% Max wheelchair distance: 70ft    Wheelchair 50 feet with 2 turns activity    Assist    Wheelchair 50 feet with 2 turns activity did not occur: Safety/medical concerns(Pt could not tolerate sitting in WC for long enough to complete activity due to low back pain)       Wheelchair 150 feet activity     Assist  Wheelchair 150 feet activity did not occur: Safety/medical concerns(Pt could not tolerate sitting in WC for long enough to  complete activity due to low back pain)       Blood pressure (!) 152/91, pulse 87, temperature 98 F (36.7 C), temperature source Oral, resp. rate 16, height 5\' 7"  (1.702 m), weight 61.7 kg, SpO2 100 %.    Medical Problem List and Plan: 1.  Impaired gait with left side weakness secondary to right basal ganglia infarction as well as small cortical infarct left frontal lobe  Continue CIR  CT head on 12/13 personally reviewed, showing some improvement, MRI personally reviewed, stable  UA unremarkable for infection, urine culture showing yeast  Chest x-ray personally reviewed, unremarkable for infection  Discussed with neurology results of the EEG -findings possibly related to uremia +/- cefepime  Team conference today to discuss current and goals and coordination of care, home and environmental barriers, and discharge planning with nursing, case manager, and therapies.   Remains confused  Ammonia ordered for tomorrow 2.  Antithrombotics: -DVT/anticoagulation: Chronic Coumadin for right IJ acute DVT.  Plan anticoagulation x3 months  INR therapeutic on 12/16             -antiplatelet therapy: N/A 3. Pain Management chronic back and left hip pain: Neurontin 100 mg nightly, oxycodone as needed.  TLSO back brace for Romero 4. Mood: Provide emotional support             -antipsychotic agents: N/A 5. Neuropsych: This patient is not capable of making decisions on her own behalf. 6. Skin/Wound Care: Routine skin checks 7. Fluids/Electrolytes/Nutrition: Routine  in and outs 8.  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  New HD tunneled catheter placed 10/26/2019. 9.  ID/recurrent Pseudomonas bacteremia.    Continue Maxipime as directed with infectious disease to discuss duration of antibiotic therapies anticipate 6 weeks.  Cipro DC'd by ID due to seizure risk 10.  Hemodialysis MWF.  Follow-up per renal services.  11.   Diabetes mellitus.  Hemoglobin A1c 7.6.  SSI.  Labile on 12/16 12.  Hypertension.  Imdur 30 mg daily  Coreg 6.25 mg twice daily, increased to 12.5 on 12/15.    Monitor with increased mobility.  Labile on 12/16 13.  Acute on chronic anemia.    Hemoglobin 8.8 on 1216, labs ordered for tomorrow  Continue to monitor 14. Constipation:  Improving 15.  Candidiasis  Diflucan started on 12/16  LOS: 7 days A FACE TO FACE EVALUATION WAS PERFORMED  [name] Lorie Phenix 11/08/2019, 10:48 AM

## 2019-11-08 NOTE — Progress Notes (Signed)
Occupational Therapy Session Note  Patient Details  Name: Sheilla Maris MRN: [id number] Date of Birth: [birth year]  Today's Date: 11/08/2019 OT Individual Time: 0700-0730 OT Individual Time Calculation (min): 30 min    Short Term Goals: Week 1:  OT Short Term Goal 1 (Week 1): Pt will perform shower transfer with mod A. OT Short Term Goal 2 (Week 1): Pt will perform toileting with mod A. OT Short Term Goal 3 (Week 1): Pt will don back brace with min A overall. OT Short Term Goal 4 (Week 1): Pt will perform LB dressing with max A overall.  Skilled Therapeutic Interventions/Progress Updates:    1:1. Pt received in bed agreeable ot OT without reports of pain however grimacing. Rn alerted. Pt completes rolling with MOD-MAX A to don pants. Pt with ataxic movement limiting BUE function. Pt completes squat pivot transfer with MAX to w/c with manual facilitation of hand palcement. Shirt and TLSO donned total A for time. PT demo decreased attention impacting participating in donning shirt. Exited session with pt seated in w/c, belt alarm, call light inreach and all needs met  Therapy Documentation Precautions:  Precautions Precautions: Fall Precaution Comments: Strong L lateral lean, TLSO for mobility Required Braces or Orthoses: Other Brace(TLSO) Spinal Brace: Thoracolumbosacral orthotic Spinal Brace Comments: for comfort with mobility Restrictions Weight Bearing Restrictions: No General:   Vital Signs: Therapy Vitals Temp: 98 F (36.7 C) Temp Source: Oral Pulse Rate: 87 Resp: 16 BP: (!) 152/91 Patient Position (if appropriate): Lying Oxygen Therapy SpO2: 100 % O2 Device: Room Air Pain: Pain Assessment Pain Score: 0-No pain ADL:   Vision   Perception    Praxis   Exercises:   Other Treatments:     Therapy/Group: Individual Therapy  Tonny Branch 11/08/2019, 7:30 AM

## 2019-11-08 NOTE — Progress Notes (Addendum)
Social Work Patient ID: [name], female   DOB: 05-09-1951, 68 y.o.   MRN: [id number]  Met with pt and son to discuss team conference but will need to get her medical issues addressed before therapy can truly assess and set goals for her. Will set target discharge date at next week's conference. Son and pt glad she is doing better and more alert now. Will continue to work on discharge needs. Both aware looking at three week length of stay here.

## 2019-11-08 NOTE — Progress Notes (Signed)
Physical Therapy Weekly Progress Note  Patient Details  Name: [name] MRN: [id number] Date of Birth: 01-14-1951  Beginning of progress report period: November 02, 2019 End of progress report period: November 08, 2019  Today's Date: 11/08/2019 PT Individual Time: 4888-9169 PT Individual Time Calculation (min): 40 min   Patient has met 0 of 3 short term goals. Pt demonstrates decreased bilateral LE strength, decreased sitting and standing balance, decreased participation in therapy, and increased pain in low back. Pt also demonstrates increased shaking and jerking motions in bilateral UE's. Pt currently requires mod A with bed mobility, max A for stand<>pivot and sit<>stand transfers with and without RW. Pt continues to state she wants to get better to go home, but that she just doesn't feel good.   Patient continues to demonstrate the following deficits muscle weakness, impaired timing and sequencing, decreased coordination and decreased motor planning and decreased sitting balance, decreased standing balance, decreased postural control, hemiplegia and decreased balance strategies and therefore will continue to benefit from skilled PT intervention to increase functional independence with mobility.  Patient progressing toward long term goals.. Continue plan of care.  PT Short Term Goals Week 1:  PT Short Term Goal 1 (Week 1): pt will transfer supine<>sit and sit<>supine min A PT Short Term Goal 1 - Progress (Week 1): Progressing toward goal PT Short Term Goal 2 (Week 1): Pt will transfer stand<>pivot with LRAD mod A PT Short Term Goal 2 - Progress (Week 1): Progressing toward goal PT Short Term Goal 3 (Week 1): Pt will perform car transfer with LRAD mod A PT Short Term Goal 3 - Progress (Week 1): Progressing toward goal Week 2:  PT Short Term Goal 1 (Week 2): Pt will transfer supine<>sit and sit<>supine with min A PT Short Term Goal 2 (Week 2): Pt will transfer stand<>pivot with  LRAD mod A PT Short Term Goal 3 (Week 2): Pt will perform car transfer with LRAD mod A  Skilled Therapeutic Interventions/Progress Updates:  Ambulation/gait training;Discharge planning;DME/adaptive equipment instruction;Functional mobility training;Pain management;Psychosocial support;Splinting/orthotics;Therapeutic Activities;UE/LE Strength taining/ROM;Balance/vestibular training;Community reintegration;Disease management/prevention;Functional electrical stimulation;Neuromuscular re-education;Patient/family education;Stair training;Therapeutic Exercise;UE/LE Coordination activities;Wheelchair propulsion/positioning   Today's Interventions: Received pt supine in bed, pt agreeable to therapy after extensive convincing, and reported that her back hurts whenever she moves. Pt fatigued from therapies earlier this morning. Session focused on functional mobility, bed mobility, LE strengthening, D/C planning, and home setup. Therapist suggested OOB activity however, pt declined, stating "my head hurts, and my back hurts when I move, I just want to lay here". RN notified and delivered pain medication. Pt educated on importance of OOB mobility to prevent weakness however pt not convinced. Therapist suggested activity sitting EOB, however pt refused. Pt finally agreeable to bed exercises. Pt performed bilateral heel slides 1x8 with verbal cues for technique, bilateral ankle PF/DF x10 reps, bilateral hip adduction with pillow squeeze 2x8 with verbal cues for technique, bilateral hip adduction against manual resistance 2x8. Pt required max verbal cues throughout session to participate in activity and multiple rest breaks due to increased fatigue and low back pain. Son present at end of session and was educated on pt's CLOF. Son optimistic that pt will be able to walk around house upon D/C and verbalized confidence in caregiver assistance from himself and pt's sister. Concluded session with pt supine in bed, needs within  reach, and bed alarm on.   Therapy Documentation Precautions:  Precautions Precautions: Fall Precaution Comments: Strong L lateral lean, TLSO for mobility Required Braces or Orthoses:  Other Brace(TLSO) Spinal Brace: Thoracolumbosacral orthotic Spinal Brace Comments: for comfort with mobility Restrictions Weight Bearing Restrictions: No  Therapy/Group: Individual Therapy Alfonse Alpers PT, DPT   11/08/2019, 7:39 AM

## 2019-11-08 NOTE — Progress Notes (Signed)
Occupational Therapy Session Note  Patient Details  Name: Rolena Sappenfield MRN: [id number] Date of Birth: [birth year]  Today's Date: 11/08/2019 OT Individual Time: 0920-1000 OT Individual Time Calculation (min): 40 min  OT Missed Time: 20 min   Short Term Goals: Week 1:  OT Short Term Goal 1 (Week 1): Pt will perform shower transfer with mod A. OT Short Term Goal 2 (Week 1): Pt will perform toileting with mod A. OT Short Term Goal 3 (Week 1): Pt will don back brace with min A overall. OT Short Term Goal 4 (Week 1): Pt will perform LB dressing with max A overall.  Skilled Therapeutic Interventions/Progress Updates:    Pt sitting up in w/c upon entry. Pt lethargic and reporting 9/10 back pain. Agreeable to ADLs but requesting to return to bed after completion. BP 148/91 at rest. Pt required extra time to initiate this session for all movement. Pt also kept eyes closed throughout most of session. Pt washed face at the sink, with ataxic movements present in the RUE as she grabbed and reached with washcloth. Pt requesting to return to bed. Pt completed squat pivot back to bed with max A. Pt with LOB EOB, requiring mod A to correct. Pt then able to briefly maintain sitting balance as TLSO was doffed. Pt returned to supine with min A. Pt completed RUE functional reaching task with cueing for visual attention. Pt able to reach across midline, with very ataxic movements in the BUE, R>L. Pt completed stacking with 4 in blocks with 75% accuracy. Pt eventually began keeping her eyes closed and session was terminated. 20 min missed. Bed alarm set.   Therapy Documentation Precautions:  Precautions Precautions: Fall Precaution Comments: Strong L lateral lean, TLSO for mobility Required Braces or Orthoses: Other Brace(TLSO) Spinal Brace: Thoracolumbosacral orthotic Spinal Brace Comments: for comfort with mobility Restrictions Weight Bearing Restrictions: No   Therapy/Group: Individual  Therapy  Curtis Sites 11/08/2019, 9:33 AM

## 2019-11-09 ENCOUNTER — Inpatient Hospital Stay (HOSPITAL_COMMUNITY): Payer: Medicare PPO | Admitting: Speech Pathology

## 2019-11-09 ENCOUNTER — Inpatient Hospital Stay (HOSPITAL_COMMUNITY): Payer: Medicare PPO | Admitting: Occupational Therapy

## 2019-11-09 ENCOUNTER — Inpatient Hospital Stay (HOSPITAL_COMMUNITY): Payer: Medicare PPO | Admitting: Physical Therapy

## 2019-11-09 DIAGNOSIS — G479 Sleep disorder, unspecified: Secondary | ICD-10-CM

## 2019-11-09 DIAGNOSIS — K7290 Hepatic failure, unspecified without coma: Secondary | ICD-10-CM

## 2019-11-09 DIAGNOSIS — E7220 Disorder of urea cycle metabolism, unspecified: Secondary | ICD-10-CM

## 2019-11-09 DIAGNOSIS — K7682 Hepatic encephalopathy: Secondary | ICD-10-CM

## 2019-11-09 LAB — CBC WITH DIFFERENTIAL/PLATELET
Abs Immature Granulocytes: 0.2 10*3/uL — ABNORMAL HIGH (ref 0.00–0.07)
Basophils Absolute: 0.1 10*3/uL (ref 0.0–0.1)
Basophils Relative: 1 %
Eosinophils Absolute: 0.1 10*3/uL (ref 0.0–0.5)
Eosinophils Relative: 1 %
HCT: 32.1 % — ABNORMAL LOW (ref 36.0–46.0)
Hemoglobin: 10.3 g/dL — ABNORMAL LOW (ref 12.0–15.0)
Immature Granulocytes: 2 %
Lymphocytes Relative: 14 %
Lymphs Abs: 1.3 10*3/uL (ref 0.7–4.0)
MCH: 29.6 pg (ref 26.0–34.0)
MCHC: 32.1 g/dL (ref 30.0–36.0)
MCV: 92.2 fL (ref 80.0–100.0)
Monocytes Absolute: 0.8 10*3/uL (ref 0.1–1.0)
Monocytes Relative: 9 %
Neutro Abs: 6.7 10*3/uL (ref 1.7–7.7)
Neutrophils Relative %: 73 %
Platelets: 272 10*3/uL (ref 150–400)
RBC: 3.48 MIL/uL — ABNORMAL LOW (ref 3.87–5.11)
RDW: 16.9 % — ABNORMAL HIGH (ref 11.5–15.5)
WBC: 9.2 10*3/uL (ref 4.0–10.5)
nRBC: 0.5 % — ABNORMAL HIGH (ref 0.0–0.2)

## 2019-11-09 LAB — AMMONIA: Ammonia: 40 umol/L — ABNORMAL HIGH (ref 9–35)

## 2019-11-09 LAB — GLUCOSE, CAPILLARY
Glucose-Capillary: 158 mg/dL — ABNORMAL HIGH (ref 70–99)
Glucose-Capillary: 135 mg/dL — ABNORMAL HIGH (ref 70–99)
Glucose-Capillary: 199 mg/dL — ABNORMAL HIGH (ref 70–99)
Glucose-Capillary: 197 mg/dL — ABNORMAL HIGH (ref 70–99)
Glucose-Capillary: 208 mg/dL — ABNORMAL HIGH (ref 70–99)

## 2019-11-09 LAB — PROTIME-INR
INR: 2.4 — ABNORMAL HIGH (ref 0.8–1.2)
Prothrombin Time: 25.9 seconds — ABNORMAL HIGH (ref 11.4–15.2)

## 2019-11-09 MED ORDER — WARFARIN SODIUM 3 MG PO TABS
3.0000 mg | ORAL_TABLET | Freq: Once | ORAL | Status: AC
  Administered 2019-11-09: 3 mg via ORAL
  Filled 2019-11-09: qty 1

## 2019-11-09 MED ORDER — LACTULOSE 10 GM/15ML PO SOLN
20.0000 g | Freq: Three times a day (TID) | ORAL | Status: DC
  Administered 2019-11-09 – 2019-11-27 (×29): 20 g via ORAL
  Filled 2019-11-09 (×45): qty 30

## 2019-11-09 MED ORDER — MELATONIN 3 MG PO TABS
3.0000 mg | ORAL_TABLET | Freq: Every day | ORAL | Status: DC
  Administered 2019-11-09 – 2019-12-04 (×25): 3 mg via ORAL
  Filled 2019-11-09 (×26): qty 1

## 2019-11-09 MED ORDER — CHLORHEXIDINE GLUCONATE CLOTH 2 % EX PADS
6.0000 | MEDICATED_PAD | Freq: Every day | CUTANEOUS | Status: DC
  Administered 2019-11-10 – 2019-11-12 (×2): 6 via TOPICAL

## 2019-11-09 MED ORDER — NON FORMULARY: 1.5000 mg | Freq: Every day | Status: DC

## 2019-11-09 NOTE — Progress Notes (Signed)
Speech Language Pathology Weekly Progress and Session Note  Patient Details  Name: [name] MRN: [id number] Date of Birth: Feb 06, 1951  Beginning of progress report period: November 03, 2019 End of progress report period: November 09, 2019  Today's Date: 11/09/2019 SLP Individual Time: 0730-0829 SLP Individual Time Calculation (min): 59 min  Short Term Goals: Week 1: SLP Short Term Goal 1 (Week 1): Pt will demonstrate functional problem solving during basic tasks with Min A verbal/visual cues SLP Short Term Goal 1 - Progress (Week 1): Progressing toward goal SLP Short Term Goal 2 (Week 1): Pt will demonstrate recall of day to day/new information with Min A verbal cues for use of compensatory memory strategies SLP Short Term Goal 2 - Progress (Week 1): Progressing toward goal SLP Short Term Goal 3 (Week 1): Pt will selectively attend to functional tasks with Min A verbal cues for redirection SLP Short Term Goal 3 - Progress (Week 1): Met SLP Short Term Goal 4 (Week 1): Pt will demonstrate anticipatory awareness by stating 2 ADLs she will need assistance with upon her return home with Min A verbal/visual cues SLP Short Term Goal 4 - Progress (Week 1): Progressing toward goal    New Short Term Goals: Week 2: SLP Short Term Goal 1 (Week 2): Pt will demonstrate functional problem solving during basic tasks with Min A verbal/visual cues SLP Short Term Goal 2 (Week 2): Pt will demonstrate recall of day to day/new information with Min A verbal cues for use of compensatory memory strategies SLP Short Term Goal 3 (Week 2): Pt will selectively attend to functional tasks with Supervision A verbal cues for redirection SLP Short Term Goal 4 (Week 2): Pt will demonstrate anticipatory awareness by stating 2 ADLs she will need assistance with upon her return home with Min A verbal/visual cues  Weekly Progress Updates: Pt has made minimal gains this reporting period due to changes in medical  status that resulted in increased severity of cognitive deficits and confusion. As a result she met only 1 out of 4 short term goals. Pt is currently Mod-Max assist for basic cognitive tasks due to impairments impacting orientation, problem solving, awareness, short term memory, and selective attention. Pt has demonstrated slightly improved selective attention. Pt's son was present for 1 session, therefore education has started but he would benefit from more education regarding impact of cognitive deficits on daily living closer to pt's d/c date. Pt would continue to benefit from skilled ST while inpatient in order to maximize functional independence and reduce burden of care prior to discharge. Anticipate that pt will need 24/7 supervision at discharge in addition to [place] follow up at next level of care.      Intensity: Minumum of 1-2 x/day, 30 to 90 minutes Frequency: 3 to 5 out of 7 days Duration/Length of Stay: TBD - 3 weeks? Treatment/Interventions: Functional tasks;Cueing hierarchy;Environmental controls;Internal/external aids;Patient/family education;Cognitive remediation/compensation   Daily Session  Skilled Therapeutic Interventions: Pt was seen for skilled ST targeting cognitive goals. Pt was OX4 - improved over yesterday. However, she continues to exhibit paranoia/question staff regarding intentions and states that she hears her sister and son talking. SLP provided reassurance staff is here to help and has her best interest in mind, helping her recover and redirected to other therapy tasks. Pt required Min-Mod A verbal cues for sequencing during stand to sit and sit to stand transfers when she used the restroom. Pt was continent of urine. Once pt transferred back to sitting upright in wheelchair, pt  sorted 52 cards by suit (club, spade, diamond, heart) with Min A for error awareness and problem solving. During additional card task (11 up), pt identified 2 cards to equal 11 with Mod A verbal and  visual cues for basic problem solving, error awareness, and recall of instructions throughout task. Pt selectively attended to tasks with overall Min A verbal and gestural cues for redirection. Pt left sitting in wheelchair with call bell in lap, seatbelt alarm in place. Continue per current plan of care.        Pain Pain Assessment Pain Scale: Faces  Faces Pain Scale: Hurts little more Pain Type: Chronic pain Pain Location: Back Pain Orientation: Lower Pain Descriptors / Indicators: Grimacing Pain Onset: With Activity Patients Stated Pain Goal: 2 Pain Intervention(s): RN made aware;Repositioned Multiple Pain Sites: No  Therapy/Group: Individual Therapy  Arbutus Leas 11/09/2019, 7:19 AM

## 2019-11-09 NOTE — Progress Notes (Signed)
Physical Therapy Session Note  Patient Details  Name: [name] MRN: [id number] Date of Birth: 1950-12-30  Today's Date: 11/09/2019 PT Individual Time: 1500-1550 PT Individual Time Calculation (min): 50 min   Short Term Goals: Week 1:  PT Short Term Goal 1 (Week 1): pt will transfer supine<>sit and sit<>supine min A PT Short Term Goal 1 - Progress (Week 1): Progressing toward goal PT Short Term Goal 2 (Week 1): Pt will transfer stand<>pivot with LRAD mod A PT Short Term Goal 2 - Progress (Week 1): Progressing toward goal PT Short Term Goal 3 (Week 1): Pt will perform car transfer with LRAD mod A PT Short Term Goal 3 - Progress (Week 1): Progressing toward goal Week 2:  PT Short Term Goal 1 (Week 2): Pt will transfer supine<>sit and sit<>supine with min A PT Short Term Goal 2 (Week 2): Pt will transfer stand<>pivot with LRAD mod A PT Short Term Goal 3 (Week 2): Pt will perform car transfer with LRAD mod A Week 3:     Skilled Therapeutic Interventions/Progress Updates:   Pt received supine in bed and agreeable to PT. Supine>sit transfer with mod assist and maderate cues for maintenance of back precautions. Mod-max assist sitting balance to prevent L lateral LOB while PT donned TLSO. stedy transfer to [facility] with mod assist in sitting to prevent lateral LOB. Pt transported to  [employer]. Stand pivot transfer to mat table with mod assist and moderate cues for increased LLE terminal knee extension. Gait training with maxisky x 12f,and max cues for improve knee extension Bil and to keep eyes open. Then pt reports dizziness/nausea. So returned to siting. Orthostatic BP taken. Sitting: 125/89. HR 101. Standing 113/81. HR 106. Pt reports s/s or orthostatic hypotension 7/10 dizziness. Sitting 124/82, decreased dizziness. After 30 sec sitting, pt reports severe nausea and multiple episodes of emesis. Pt returned to room and performed stedy transfer to bed with mod assist. Sit>supine completed with  min assist, and left supine in bed with call bell in reach and all needs met. RN aware of pt presentation.        Therapy Documentation Precautions:  Precautions Precautions: Fall Precaution Comments: Strong L lateral lean, TLSO for mobility Required Braces or Orthoses: Other Brace(TLSO) Spinal Brace: Thoracolumbosacral orthotic Spinal Brace Comments: for comfort with mobility Restrictions Weight Bearing Restrictions: No General: PT Amount of Missed Time (min): 10 Minutes PT Missed Treatment Reason: Patient fatigue;Patient ill (Comment) Vital Signs:   see above  Pain:   denies at rest   Therapy/Group: Individual Therapy  ALorie Phenix12/17/2020, 5:29 PM

## 2019-11-09 NOTE — Progress Notes (Signed)
At time of medication administration, patient was reluctant to take her medication. She was also having nonsensical speech. She was informed of her blood pressure & told that she needed to take her blood pressure medications. She then took all of her meds. She insisted that there was someone looking at her through her window, even though the blinds were closed. When reorientation is attempted, she is argumentative in her belief. She has not slept yet tonight. The nurse has been in her room 4 times & each time patient was awake. A few minutes ago, patient called & asked for something for pain. When the nurse went to give it to her, she agreed until the nurse gave her the medicine cup. She then stated, I'm not taking it, while talking the medicine cup. The nurse asked for it back. When asked what was hurting, she did not answer, but was fixated on the temperature gage on the wall, insisting that it was a telephone. No signs of acute distress noted. IV ABT dose is finished & she showed no signs of acute distress. Will continue to monitor.

## 2019-11-09 NOTE — Progress Notes (Signed)
Pharmacy Antibiotic Note  [name] is a 68 y.o. female admitted on 11/01/2019 with Pseudomonas bacteremia and MV IE.  Pharmacy has been consulted for Cefepime dosing.  The patient is ESRD-MWF. Noted ID plans for dual Cefepime + Cipro thru 12/06/19. Cipro DC'd on 12/15 given witnessed seizure. Fluconazole one time dose given for yeast UTI.  Will continue Cefepime daily dosing with in CIR and plan to switch to dosing with HD upon discharge.   Plan: - Continue Cefepime 1g IV every 24 hours after HD - Last day of cefepime is 12/06/19 per ID - At discharge can change to cefepime 2g after HD on Mon/Wed, 3g after HD Fridays   Height: 5\' 7"  (170.2 cm) Weight: 132 lb 15 oz (60.3 kg) IBW/kg (Calculated) : 61.6  Temp (24hrs), Avg:98.7 F (37.1 C), Min:97.8 F (36.6 C), Max:99.5 F (37.5 C)  Recent Labs  Lab 11/03/19 1320 11/05/19 0528 11/06/19 0706 11/06/19 1511 11/07/19 0606 11/07/19 0933 11/08/19 0510 11/08/19 1254 11/09/19 0731  WBC  --  7.7 7.1  --  9.1  --  7.8  --  9.2  CREATININE 6.71*  --   --  8.97*  --  5.23*  --  7.32*  --     Estimated Creatinine Clearance: 6.9 mL/min (A) (by C-G formula based on SCr of 7.32 mg/dL (H)).    No Known Allergies  Antimicrobials this admission: Cefepime 11/30 >> (12/06/19) Flluconazole 12/16  Cipro 12/3 >> 12/14  Microbiology results: 11/27 COVID >> neg 11/29 BCx >> pseudomonas 4/8 (pan sens) 12/1 Bcx>>neg 12/6 BCx: neg 12/14 UCx: yeast   Thank you for involving pharmacy in this patient's care.  Benetta Spar, PharmD, BCPS, BCCP Clinical Pharmacist  Please check AMION for all [place] phone numbers After 10:00 PM, call [place] [phone]

## 2019-11-09 NOTE — Progress Notes (Addendum)
[place] PHYSICAL MEDICINE & REHABILITATION PROGRESS NOTE   Subjective/Complaints: Patient seen sitting up in bed this morning working with therapies.  She states she did not sleep well overnight, confirmed with nursing who states patient was up all night.  She appears more paranoid and perseverative this morning, however more oriented.  ROS: Limited due to cognition/language.  Objective:   No results found. Recent Labs    11/08/19 0510 11/09/19 0731  WBC 7.8 9.2  HGB 8.8* 10.3*  HCT 27.9* 32.1*  PLT 211 272   Recent Labs    11/07/19 0933 11/08/19 1254  NA 129* 128*  K 4.7 4.7  CL 93* 90*  CO2 26 24  GLUCOSE 128* 200*  BUN 23 38*  CREATININE 5.23* 7.32*  CALCIUM 8.7* 8.8*    Intake/Output Summary (Last 24 hours) at 11/09/2019 1054 Last data filed at 11/08/2019 1841 Gross per 24 hour  Intake 360 ml  Output 1538 ml  Net -1178 ml     Physical Exam: Vital Signs Blood pressure 126/85, pulse 93, temperature 98.3 F (36.8 C), temperature source Oral, resp. rate 18, height 5\' 7"  (1.702 m), weight 60.3 kg, SpO2 100 %. Constitutional: No distress . Vital signs reviewed. HENT: Normocephalic.  Atraumatic. Eyes: EOMI. No discharge. Cardiovascular: No JVD. Respiratory: Normal effort.  No stridor. GI: Non-distended. Skin: Warm and dry.  Intact. Psych: Perseverative, hallucinating Musc: No edema in extremities.  No tenderness in extremities. Neurological: Alert and oriented x 3 Motor: RUE: 5/5 proximal to distal RLE: HF, KE 3/5, ADF 4/5 LUE: 4/5 proximal to distal LLE: HF, KE 3/5, ADF 3+/5  Assessment/Plan: 1. Functional deficits secondary to right basal ganglia stroke which require 3+ hours per day of interdisciplinary therapy in a comprehensive inpatient rehab setting.  Physiatrist is providing close team supervision and 24 hour management of active medical problems listed below.  Physiatrist and rehab team continue to assess barriers to discharge/monitor  patient progress toward functional and medical goals  Care Tool:  Bathing    Body parts bathed by patient: Face, Left arm, Chest, Abdomen   Body parts bathed by helper: Right arm     Bathing assist Assist Level: Minimal Assistance - Patient > 75%     Upper Body Dressing/Undressing Upper body dressing   What is the patient wearing?: Pull over shirt    Upper body assist Assist Level: Maximal Assistance - Patient 25 - 49%    Lower Body Dressing/Undressing Lower body dressing      What is the patient wearing?: Pants     Lower body assist Assist for lower body dressing: Maximal Assistance - Patient 25 - 49%     Toileting Toileting    Toileting assist Assist for toileting: Moderate Assistance - Patient 50 - 74%     Transfers Chair/bed transfer  Transfers assist     Chair/bed transfer assist level: Maximal Assistance - Patient 25 - 49%     Locomotion Ambulation   Ambulation assist   Ambulation activity did not occur: Safety/medical concerns(back pain, bilateral LE weakness, poor bilateral knee control)          Walk 10 feet activity   Assist  Walk 10 feet activity did not occur: Safety/medical concerns(back pain, bilateral LE weakness, poor bilateral knee control)        Walk 50 feet activity   Assist Walk 50 feet with 2 turns activity did not occur: Safety/medical concerns(back pain, bilateral LE weakness, poor bilateral knee control)  Walk 150 feet activity   Assist Walk 150 feet activity did not occur: Safety/medical concerns(back pain, bilateral LE weakness, poor bilateral knee control)         Walk 10 feet on uneven surface  activity   Assist Walk 10 feet on uneven surfaces activity did not occur: Safety/medical concerns(back pain, bilateral LE weakness, poor bilateral knee control)         Wheelchair     Assist Will patient use wheelchair at discharge?: Yes Type of Wheelchair: Manual Wheelchair activity did not  occur: Safety/medical concerns(Pt could not tolerate sitting in WC for long enough to complete activity due to low back pain)  Wheelchair assist level: Minimal Assistance - Patient > 75% Max wheelchair distance: 41ft    Wheelchair 50 feet with 2 turns activity    Assist    Wheelchair 50 feet with 2 turns activity did not occur: Safety/medical concerns(Pt could not tolerate sitting in WC for long enough to complete activity due to low back pain)       Wheelchair 150 feet activity     Assist  Wheelchair 150 feet activity did not occur: Safety/medical concerns(Pt could not tolerate sitting in [facility] for long enough to complete activity due to low back pain)       Blood pressure 126/85, pulse 93, temperature 98.3 F (36.8 C), temperature source Oral, resp. rate 18, height 5\' 7"  (1.702 m), weight 60.3 kg, SpO2 100 %.    Medical Problem List and Plan: 1.  Impaired gait with left side weakness secondary to right basal ganglia infarction as well as small cortical infarct left frontal lobe  Continue CIR  CT head on 12/13 personally reviewed, showing some improvement, MRI personally reviewed, stable  Chest x-ray personally reviewed, unremarkable for infection  Discussed with neurology results of the EEG -findings possibly related to uremia +/- cefepime  Remains confused, now with hallucinations and paranoia  Psychiatry consulted as well 2.  Antithrombotics: -DVT/anticoagulation: Chronic Coumadin for right IJ acute DVT.  Plan anticoagulation x3 months  INR therapeutic on 12/17             -antiplatelet therapy: N/A 3. Pain Management chronic back and left hip pain: Neurontin 100 mg nightly, oxycodone as needed.  TLSO back brace for comfort 4. Mood: Provide emotional support             -antipsychotic agents: N/A 5. Neuropsych: This patient is not capable of making decisions on her own behalf. 6. Skin/Wound Care: Routine skin checks 7. Fluids/Electrolytes/Nutrition: Routine in and  outs 8.  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  New HD tunneled catheter placed 10/26/2019. 9.  ID/recurrent Pseudomonas bacteremia.    Continue Maxipime as directed with infectious disease to discuss duration of antibiotic therapies anticipate 6 weeks.  Cipro DC'd by ID due to seizure risk  Will discuss with ID if no improvement 10.  Hemodialysis MWF.  Follow-up per renal services.  11.  Diabetes mellitus.  Hemoglobin A1c 7.6.  SSI.  Labile on 12/17 12.  Hypertension.  Imdur 30 mg daily  Coreg 6.25 mg twice daily, increased to 12.5 on 12/15.    Monitor with increased mobility.  Labile on 12/17 13.  Acute on chronic anemia.    Hemoglobin 10.3 on 12/17  Continue to monitor 14. Constipation:  See #16 15.  Candidiasis  Diflucan ordered on 12/16 16.  Hyperammonemia with hepatic encephalopathy  Lactulose started on 12/17 17. Sleep disturance  Melatonin  added on 12/17  LOS: 8 days A FACE TO FACE EVALUATION WAS PERFORMED  Carles Florea Lorie Phenix 11/09/2019, 10:54 AM

## 2019-11-09 NOTE — Progress Notes (Signed)
Occupational Therapy Session Note  Patient Details  Name: [name] MRN: [id number] Date of Birth: [birth year]  Today's Date: 11/09/2019 OT Individual Time: [id number] OT Individual Time Calculation (min): 45 min (missed 15 mins)   Short Term Goals: Week 1:  OT Short Term Goal 1 (Week 1): Pt will perform shower transfer with mod A. OT Short Term Goal 2 (Week 1): Pt will perform toileting with mod A. OT Short Term Goal 3 (Week 1): Pt will don back brace with min A overall. OT Short Term Goal 4 (Week 1): Pt will perform LB dressing with max A overall.  Skilled Therapeutic Interventions/Progress Updates:    Treatment session with focus on activity tolerance, endurance, and functional use of BUE. Pt received upright in w/c with TLSO on and no complaints of pain.  Engaged pt in UE activity in sitting with focus on motor control and following one to two step commands.  Incorporated reaching and stacking of oversize legos to replicate verbal pattern.  Pt required min cues for recall. Noted ataxic movements bilaterally.  Pt began to complain of back pain, quickly escalating to pt reporting hot and then vomiting in emesis bag.  Pt insistent on returning to bed.  Completed squat pivot transfer Max assist to EOB.  Pt maintain sitting balance CGA while therapist removed TLSO.  Pt returned to supine with assist to lift LLE in to bed.  Engaged in rolling with supervision to position upright in bed and alleviate back pain.   Noted pt with increased "paranoia" like behavior with pt yelling out for her sister.  Pt stating that her family is here for a family conference.  Attempted to reorient pt to which she stated "you people are lying".  Pt oriented to place and situation, but insisting that her family is in the hallway.  Therapy Documentation Precautions:  Precautions Precautions: Fall Precaution Comments: Strong L lateral lean, TLSO for mobility Required Braces or Orthoses: Other  Brace(TLSO) Spinal Brace: Thoracolumbosacral orthotic Spinal Brace Comments: for comfort with mobility Restrictions Weight Bearing Restrictions: No General: General OT Amount of Missed Time: 15 Minutes Pain: Pain Assessment Pain Scale: Faces Pain Score: 4  Faces Pain Scale: Hurts little more Pain Type: Chronic pain Pain Location: Back Pain Orientation: Lower Pain Descriptors / Indicators: Grimacing Pain Onset: With Activity Patients Stated Pain Goal: 2 Pain Intervention(s): RN made aware;Repositioned Multiple Pain Sites: No   Therapy/Group: Individual Therapy  [name] 11/09/2019, 9:58 AM

## 2019-11-09 NOTE — Progress Notes (Signed)
ANTICOAGULATION CONSULT NOTE - Follow-Up  Pharmacy Consult for Warfarin Indication: RIJ DVT (10/21/19), CVA (10/20/19)  No Known Allergies  Patient Measurements: Height: 5\' 7"  (170.2 cm) Weight: 132 lb 15 oz (60.3 kg) IBW/kg (Calculated) : 61.6  Vital Signs: Temp: 98.3 F (36.8 C) (12/17 0510) Temp Source: Oral (12/17 0510) BP: 126/85 (12/17 0510) Pulse Rate: 93 (12/17 0510)  Labs: Recent Labs    11/06/19 1511 11/07/19 0606 11/07/19 0933 11/08/19 0510 11/08/19 1254 11/09/19 0731  HGB  --  9.5*  --  8.8*  --  10.3*  HCT  --  30.2*  --  27.9*  --  32.1*  PLT  --  221  --  211  --  272  LABPROT  --  29.0*  --  30.0*  --  25.9*  INR  --  2.8*  --  2.9*  --  2.4*  CREATININE 8.97*  --  5.23*  --  7.32*  --     Estimated Creatinine Clearance: 6.9 mL/min (A) (by C-G formula based on SCr of 7.32 mg/dL (H)).  Assessment: 68 yr old female with ESRD who presented on 10/20/19 with new CVA and RIJ DVT 10/21/19. Pharmacy consulted to dose Warfarin.   Warfarin started 12/4. Ciprofloxacin stopped 12/14, expect drug interaction with Warfarin to have resolved, will increase dose today. Fluconazole x1 given 12/16, also increases warfarin sensitivity.  INR today is therapeutic at 2.4. H/H & plt stable . No bleeding noted.   Goal of Therapy:  INR 2-3 Monitor platelets by anticoagulation protocol: Yes   Plan:  - Warfarin 3mg  x1 tonight - Daily PT/INR, CBC Q Mon - Will continue to monitor for any signs/symptoms of bleeding and will follow up with PT/INR in the a.m.  Thank you for involving pharmacy in this patient's care.  Benetta Spar, PharmD, BCPS, BCCP Clinical Pharmacist  Please check AMION for all [place] phone numbers After 10:00 PM, call [place] [phone]

## 2019-11-09 NOTE — Consult Note (Signed)
Telepsych Consultation   Reason for Consult: "Hallucinations and paranoia, more at night" Referring Physician:  Dr Posey Pronto Location of Patient: [facility] Location of Provider: [facility]  Patient Identification: [name] MRN:  [id number] Principal Diagnosis: Cerebrovascular accident (CVA) of right basal ganglia (HCC) Diagnosis:  Principal Problem:   Cerebrovascular accident (CVA) of right basal ganglia ([place]) Active Problems:   Slow transit constipation   Acute on chronic anemia   Labile blood pressure   Labile blood glucose   Diabetes mellitus type 2 in nonobese (HCC)   Bacteremia   Endocarditis of mitral valve   Septic embolism (HCC)   Seizure ([place])   Candidiasis   Encephalopathy, hepatic (HCC)   Hyperammonemia ([place])   Total Time spent with patient: 45 minutes  Subjective:   [name] is a 67 y.o. female patient admitted with Pseudomonas bacteremia.  Patient assessed by nurse practitioner.  Patient alert and oriented at this time, answers appropriately.  Patient gives verbal consent to complete interview with adult son, [name] Clark, at bedside.  Patient gives verbal consent to discuss treatment plan with adult son, [name] Clark.  Patient denies suicidal and homicidal ideations.  Patient denies history of self-harm.  Patient denies auditory hallucinations.  Patient denies history of mental illness, patient denies history of suicidal ideations.  Patient reports she lives at home with her son and her sister, feels safe at home.  Patient denies access to weapons.   Spoke with patient's son [name] Clark: Mr. Carlis Abbott states "we have seen this before back in August in [place] when they gave her this medication for infection."  Per patient's son patient has no psychiatric history that he is aware of.  Per patient's son patient has no history of suicidal or homicidal ideations.  Patient's son also states patient has no history of auditory or visual hallucinations  except for last night and "the exact same thing" back in August.  HPI: Hallucinations and paranoia, more at night.  Past Psychiatric History: None  Risk to Self:  No Risk to Others:  No  prior Inpatient Therapy:  No prior Outpatient Therapy:  No  Past Medical History:  Past Medical History:  Diagnosis Date  . Arthritis    hands  . Constipation   . Diabetes mellitus without complication (HCC)    Type II  . ESRD (end stage renal disease) ([place])     M/W/F- Hemodialysis  . GERD (gastroesophageal reflux disease)   . Headache   . Hyperlipidemia   . Hypertension   . Irregular heart rate   . Stroke [facility])    TIA - approx 2010- no residual    Past Surgical History:  Procedure Laterality Date  . ABDOMINAL HYSTERECTOMY    . AORTIC ARCH ANGIOGRAPHY N/A 03/28/2019   Procedure: AORTIC ARCH ANGIOGRAPHY;  Surgeon: Waynetta Sandy, MD;  Location: [facility];  Service: Cardiovascular;  Laterality: N/A;  . AV FISTULA PLACEMENT Left 06/13/2018   Procedure: ARTERIOVENOUS (AV) FISTULA CREATION;  Surgeon: Rosetta Posner, MD;  Location: [place];  Service: Vascular;  Laterality: Left;  . AV FISTULA PLACEMENT Left 08/18/2018   Procedure: INSERTION OF 4-7MM X 45CM ARTERIOVENOUS (AV) GORE-TEX GRAFT LEFT  FOREARM;  Surgeon: Rosetta Posner, MD;  Location: [place];  Service: Vascular;  Laterality: Left;  . AV FISTULA PLACEMENT Right 04/06/2019   Procedure: INSERTION OF ARTERIOVENOUS (AV) GORE-TEX GRAFT RIGHT UPPER ARM;  Surgeon: Waynetta Sandy, MD;  Location: [place];  Service: Vascular;  Laterality: Right;  .  AV FISTULA PLACEMENT Right 04/13/2019   Procedure: INSERTION OF ARTERIOVENOUS (AV) BOVINE  ARTEGRAFT GRAFT RIGHT UPPER EXTREMITY;  Surgeon: Serafina Mitchell, MD;  Location: [place];  Service: Vascular;  Laterality: Right;  . [place] REMOVAL Right 05/16/2019   Procedure: REMOVAL OF ARTERIOVENOUS GORETEX GRAFT ([place]) RIGHT ARM;  Surgeon: Angelia Mould, MD;  Location: [place];  Service:  Vascular;  Laterality: Right;  . BASCILIC VEIN TRANSPOSITION Right 03/07/2019   Procedure: First Stage Bascilic Vein Transposition Right Arm;  Surgeon: Serafina Mitchell, MD;  Location: [place];  Service: Vascular;  Laterality: Right;  . CATARACT EXTRACTION Right 2005  . INSERTION OF DIALYSIS CATHETER N/A 08/18/2018   Procedure: INSERTION OF DIALYSIS CATHETER;  Surgeon: Rosetta Posner, MD;  Location: [place];  Service: Vascular;  Laterality: N/A;  . INSERTION OF DIALYSIS CATHETER Right 07/25/2019   Procedure: INSERTION OF DIALYSIS CATHETER RIGHT INTERNAL JUGULAR (TUNNELED);  Surgeon: Virl Cagey, MD;  Location: [facility];  Service: General;  Laterality: Right;  . IR FLUORO GUIDE CV LINE LEFT  10/26/2019  . IR FLUORO GUIDE CV LINE RIGHT  12/06/2018  . IR PTA ADDL CENTRAL DIALYSIS SEG THRU DIALY CIRCUIT RIGHT Right 12/06/2018  . IR REMOVAL TUN CV CATH W/O FL  10/23/2019  . IR US GUIDE VASC ACCESS LEFT  10/26/2019  . TEE WITHOUT CARDIOVERSION N/A 10/26/2019   Procedure: TRANSESOPHAGEAL ECHOCARDIOGRAM (TEE);  Surgeon: Lelon Perla, MD;  Location: [place];  Service: Cardiovascular;  Laterality: N/A;  . THROMBECTOMY AND REVISION OF ARTERIOVENTOUS (AV) GORETEX  GRAFT Left 09/29/2018   Procedure: INSERTION OF ARTERIOVENTOUS (AV) GORETEX  GRAFT ARM;  Surgeon: Waynetta Sandy, MD;  Location: [place];  Service: Vascular;  Laterality: Left;  . UPPER EXTREMITY ANGIOGRAPHY Right 03/28/2019   Procedure: UPPER EXTREMITY ANGIOGRAPHY;  Surgeon: Waynetta Sandy, MD;  Location: [facility];  Service: Cardiovascular;  Laterality: Right;  . VENOGRAM  10/27/2018   Procedure: Venogram;  Surgeon: Marty Heck, MD;  Location: [facility];  Service: Cardiovascular;;  bilateral arm   Family History:  Family History  Problem Relation Age of Onset  . Heart murmur Mother   . Heart failure Mother   . Diabetes Mother   . Cirrhosis Father   . Alcoholism Father    Family Psychiatric   History: Unknown Social History:  Social History   Substance and Sexual Activity  Alcohol Use Never     Social History   Substance and Sexual Activity  Drug Use Never    Social History   Socioeconomic History  . Marital status: Single    Spouse name: Not on file  . Number of children: Not on file  . Years of education: Not on file  . Highest education level: Not on file  Occupational History  . Not on file  Tobacco Use  . Smoking status: Never Smoker  . Smokeless tobacco: Never Used  Substance and Sexual Activity  . Alcohol use: Never  . Drug use: Never  . Sexual activity: Not on file  Other Topics Concern  . Not on file  Social History Narrative  . Not on file   Social Determinants of Health   Financial Resource Strain:   . Difficulty of Paying Living Expenses: Not on file  Food Insecurity:   . Worried About Charity fundraiser in the Last Year: Not on file  . Ran Out of Food in the Last Year: Not on file  Transportation  Needs:   . Lack of Transportation (Medical): Not on file  . Lack of Transportation (Non-Medical): Not on file  Physical Activity:   . Days of Exercise per Week: Not on file  . Minutes of Exercise per Session: Not on file  Stress:   . Feeling of Stress : Not on file  Social Connections:   . Frequency of Communication with Friends and Family: Not on file  . Frequency of Social Gatherings with Friends and Family: Not on file  . Attends Religious Services: Not on file  . Active Member of Clubs or Organizations: Not on file  . Attends Archivist Meetings: Not on file  . Marital Status: Not on file   Additional Social History:    Allergies:  No Known Allergies  Labs:  Results for orders placed or performed during the hospital encounter of 11/01/19 (from the past 48 hour(s))  Glucose, capillary     Status: Abnormal   Collection Time: 11/07/19  1:20 PM  Result Value Ref Range   Glucose-Capillary 161 (H) 70 - 99 mg/dL  Glucose,  capillary     Status: Abnormal   Collection Time: 11/07/19  4:37 PM  Result Value Ref Range   Glucose-Capillary 182 (H) 70 - 99 mg/dL  Glucose, capillary     Status: Abnormal   Collection Time: 11/07/19 10:00 PM  Result Value Ref Range   Glucose-Capillary 215 (H) 70 - 99 mg/dL  Protime-INR     Status: Abnormal   Collection Time: 11/08/19  5:10 AM  Result Value Ref Range   Prothrombin Time 30.0 (H) 11.4 - 15.2 seconds   INR 2.9 (H) 0.8 - 1.2    Comment: (NOTE) INR goal varies based on device and disease states. Performed at [facility], [employer] [address] [place], [place] [address]   CBC     Status: Abnormal   Collection Time: 11/08/19  5:10 AM  Result Value Ref Range   WBC 7.8 4.0 - 10.5 K/uL   RBC 3.02 (L) 3.87 - 5.11 MIL/uL   Hemoglobin 8.8 (L) 12.0 - 15.0 g/dL   HCT 27.9 (L) 36.0 - 46.0 %   MCV 92.4 80.0 - 100.0 fL   MCH 29.1 26.0 - 34.0 pg   MCHC 31.5 30.0 - 36.0 g/dL   RDW 16.4 (H) 11.5 - 15.5 %   Platelets 211 150 - 400 K/uL   nRBC 0.4 (H) 0.0 - 0.2 %    Comment: Performed at [facility] [address] [place], [place] [address]  Glucose, capillary     Status: Abnormal   Collection Time: 11/08/19  6:28 AM  Result Value Ref Range   Glucose-Capillary 126 (H) 70 - 99 mg/dL   Comment 1 Notify RN   Glucose, capillary     Status: Abnormal   Collection Time: 11/08/19 11:11 AM  Result Value Ref Range   Glucose-Capillary 219 (H) 70 - 99 mg/dL  Renal function panel     Status: Abnormal   Collection Time: 11/08/19 12:54 PM  Result Value Ref Range   Sodium 128 (L) 135 - 145 mmol/L   Potassium 4.7 3.5 - 5.1 mmol/L   Chloride 90 (L) 98 - 111 mmol/L   CO2 24 22 - 32 mmol/L   Glucose, Bld 200 (H) 70 - 99 mg/dL   BUN 38 (H) 8 - 23 mg/dL   Creatinine, Ser 7.32 (H) 0.44 - 1.00 mg/dL   Calcium 8.8 (L) 8.9 - 10.3 mg/dL  Phosphorus 4.7 (H) 2.5 - 4.6 mg/dL   Albumin 2.2 (L) 3.5 - 5.0 g/dL   GFR calc non Af Amer 5 (L) >60 mL/min   GFR calc Af Amer 6 (L) >60 mL/min    Anion gap 14 5 - 15    Comment: Performed at [facility] [address], [place], [place] [address]  Glucose, capillary     Status: Abnormal   Collection Time: 11/08/19  5:19 PM  Result Value Ref Range   Glucose-Capillary 123 (H) 70 - 99 mg/dL  Glucose, capillary     Status: Abnormal   Collection Time: 11/08/19  9:15 PM  Result Value Ref Range   Glucose-Capillary 212 (H) 70 - 99 mg/dL  Glucose, capillary     Status: Abnormal   Collection Time: 11/09/19  6:16 AM  Result Value Ref Range   Glucose-Capillary 158 (H) 70 - 99 mg/dL  Protime-INR     Status: Abnormal   Collection Time: 11/09/19  7:31 AM  Result Value Ref Range   Prothrombin Time 25.9 (H) 11.4 - 15.2 seconds   INR 2.4 (H) 0.8 - 1.2    Comment: (NOTE) INR goal varies based on device and disease states. Performed at [facility], [place] [address] [place], [place] [address]   AM CBC     Status: Abnormal   Collection Time: 11/09/19  7:31 AM  Result Value Ref Range   WBC 9.2 4.0 - 10.5 K/uL   RBC 3.48 (L) 3.87 - 5.11 MIL/uL   Hemoglobin 10.3 (L) 12.0 - 15.0 g/dL   HCT 32.1 (L) 36.0 - 46.0 %   MCV 92.2 80.0 - 100.0 fL   MCH 29.6 26.0 - 34.0 pg   MCHC 32.1 30.0 - 36.0 g/dL   RDW 16.9 (H) 11.5 - 15.5 %   Platelets 272 150 - 400 K/uL   nRBC 0.5 (H) 0.0 - 0.2 %   Neutrophils Relative % 73 %   Neutro Abs 6.7 1.7 - 7.7 K/uL   Lymphocytes Relative 14 %   Lymphs Abs 1.3 0.7 - 4.0 K/uL   Monocytes Relative 9 %   Monocytes Absolute 0.8 0.1 - 1.0 K/uL   Eosinophils Relative 1 %   Eosinophils Absolute 0.1 0.0 - 0.5 K/uL   Basophils Relative 1 %   Basophils Absolute 0.1 0.0 - 0.1 K/uL   Immature Granulocytes 2 %   Abs Immature Granulocytes 0.20 (H) 0.00 - 0.07 K/uL    Comment: Performed at [facility], [address] [place], [place] [address]  Ammonia     Status: Abnormal   Collection Time: 11/09/19  7:31 AM  Result Value Ref Range   Ammonia 40 (H) 9 - 35 umol/L    Comment: Performed at [facility], [place] [address], [place], [place] [address]  Glucose, capillary     Status: Abnormal   Collection Time: 11/09/19 12:25 PM  Result Value Ref Range   Glucose-Capillary 135 (H) 70 - 99 mg/dL   Comment 1 Notify RN     Medications:  Current Facility-Administered Medications  Medication Dose Route Frequency Provider Last Rate Last Admin  . 0.9 %  sodium chloride infusion  100 mL Intravenous PRN Alric Seton, PA-C      . 0.9 %  sodium chloride infusion  100 mL Intravenous PRN Alric Seton, PA-C      . acetaminophen (TYLENOL) tablet 650 mg  650 mg Oral Q12H PRN Meredith Staggers, MD  Or  . acetaminophen (TYLENOL) 160 MG/5ML solution 650 mg  650 mg Per Tube Q4H PRN Meredith Staggers, MD       Or  . acetaminophen (TYLENOL) suppository 650 mg  650 mg Rectal Q4H PRN Meredith Staggers, MD      . acetaminophen (TYLENOL) tablet 500 mg  500 mg Oral TID Meredith Staggers, MD   500 mg at 11/09/19 1234  . alteplase (CATHFLO ACTIVASE) injection 2 mg  2 mg Intracatheter Once PRN Alric Seton, PA-C      . calcium carbonate (TUMS - dosed in mg elemental calcium) chewable tablet 200 mg of elemental calcium  1 tablet Oral 2 times per day Cathlyn Parsons, PA-C   200 mg of elemental calcium at 11/09/19 1233  . carvedilol (COREG) tablet 12.5 mg  12.5 mg Oral BID Jamse Arn, MD   12.5 mg at 11/09/19 0759  . ceFEPIme (MAXIPIME) 1 g in sodium chloride 0.9 % 100 mL IVPB  1 g Intravenous Q24H Cathlyn Parsons, PA-C 200 mL/hr at 11/08/19 2047 1 g at 11/08/19 2047  . Chlorhexidine Gluconate Cloth 2 % PADS 6 each  6 each Topical BID Jamse Arn, MD   6 each at 11/08/19 1742  . Chlorhexidine Gluconate Cloth 2 % PADS 6 each  6 each Topical Q0600 Alric Seton, PA-C   6 each at 11/08/19 0520  . Darbepoetin Alfa (ARANESP) injection 60 mcg  60 mcg Intravenous Q Mon-HD Alric Seton, PA-C   60 mcg at 11/06/19 1744  . doxercalciferol (HECTOROL) injection 1.5 mcg  1.5 mcg Intravenous Q  M,W,F-HD Angiulli, Lavon Paganini, PA-C   1.5 mcg at 11/08/19 1616  . feeding supplement (ENSURE ENLIVE) (ENSURE ENLIVE) liquid 237 mL  237 mL Oral BID BM AngiulliLavon Paganini, PA-C   237 mL at 11/09/19 1234  . fluticasone (FLONASE) 50 MCG/ACT nasal spray 1 spray  1 spray Each Nare Daily PRN Angiulli, Lavon Paganini, PA-C      . heparin injection 1,000 Units  1,000 Units Dialysis PRN Alric Seton, PA-C   3,200 Units at 11/08/19 1624  . heparin injection 1,200 Units  20 Units/kg Dialysis PRN Alric Seton, PA-C   1,200 Units at 11/08/19 1617  . insulin aspart (novoLOG) injection 0-9 Units  0-9 Units Subcutaneous TID WC AngiulliLavon Paganini, PA-C   1 Units at 11/09/19 1240  . isosorbide mononitrate (IMDUR) 24 hr tablet 30 mg  30 mg Oral QPC lunch Cathlyn Parsons, PA-C   30 mg at 11/09/19 1234  . lactulose (CHRONULAC) 10 GM/15ML solution 20 g  20 g Oral TID Jamse Arn, MD   20 g at 11/09/19 1233  . lidocaine (PF) (XYLOCAINE) 1 % injection 5 mL  5 mL Intradermal PRN Alric Seton, PA-C      . lidocaine-prilocaine (EMLA) cream 1 application  1 application Topical PRN Alric Seton, PA-C      . meclizine (ANTIVERT) tablet 25 mg  25 mg Oral Q8H PRN Meredith Staggers, MD   25 mg at 11/05/19 1409  . multivitamin (RENA-VIT) tablet 1 tablet  1 tablet Oral QPC lunch Cathlyn Parsons, PA-C   1 tablet at 11/08/19 2048  . ondansetron (ZOFRAN) tablet 4 mg  4 mg Oral Q8H PRN Angiulli, Lavon Paganini, PA-C      . oxyCODONE (Oxy IR/ROXICODONE) immediate release tablet 5 mg  5 mg Oral Q6H PRN Cathlyn Parsons, PA-C   5 mg at 11/08/19 1105  .  pentafluoroprop-tetrafluoroeth (GEBAUERS) aerosol 1 application  1 application Topical PRN Alric Seton, PA-C      . sorbitol 70 % solution 30 mL  30 mL Oral Daily PRN Cathlyn Parsons, PA-C   30 mL at 11/02/19 [id number]  . Warfarin - Pharmacist Dosing Inpatient   Does not apply q1800 Elizabeth Sauer   Stopped at 11/08/19 1800   Facility-Administered Medications Ordered  in Other Encounters  Medication Dose Route Frequency Provider Last Rate Last Admin  . 0.9 %  sodium chloride infusion   Intravenous Continuous Monia Sabal, PA-C   New Bag at 05/16/19 1130    Musculoskeletal: Strength & Muscle Tone: Unable to assess  Gait & Station: Unable to assess Patient leans: Unable to assess  Psychiatric Specialty Exam: Physical Exam  Constitutional: She is oriented to person, place, and time. She appears well-developed and well-nourished.  HENT:  Head: Normocephalic.  Cardiovascular: Normal rate.  Respiratory: Effort normal.  Musculoskeletal:     Cervical back: Normal range of motion.  Neurological: She is alert and oriented to person, place, and time.  Psychiatric: She has a normal mood and affect. Her behavior is normal. Judgment and thought content normal.    Review of Systems  Constitutional: Negative.   HENT: Negative.   Eyes: Negative.   Respiratory: Negative.   Cardiovascular: Negative.   Gastrointestinal: Negative.   Genitourinary: Negative.   Musculoskeletal: Negative.   Skin: Negative.   Neurological: Negative.   Psychiatric/Behavioral: Negative.     Blood pressure 126/85, pulse 93, temperature 98.3 F (36.8 C), temperature source Oral, resp. rate 18, height 5\' 7"  (1.702 m), weight 60.3 kg, SpO2 100 %.Body mass index is 20.82 kg/m.  General Appearance: Casual  Eye Contact:  Minimal  Speech:  Clear and Coherent and Normal Rate  Volume:  Normal  Mood:  Euthymic  Affect:  Appropriate and Congruent  Thought Process:  Coherent, Goal Directed and Descriptions of Associations: Intact  Orientation:  Full (Time, Place, and Person)  Thought Content:  WDL and Logical  Suicidal Thoughts:  No  Homicidal Thoughts:  No  Memory:  Immediate;   Fair Recent;   Fair Remote;   Fair  Judgement:  Fair  Insight:  Fair  Psychomotor Activity:  Normal  Concentration:  Concentration: Fair and Attention Span: Fair  Recall:  Good  Fund of Knowledge:   Good  Language:  Good  Akathisia:  No  Handed:  Right  AIMS (if indicated):     Assets:  Communication Skills Desire for Improvement Financial Resources/Insurance Housing Social Support  ADL's:  Impaired  Cognition:  WNL  Sleep:        Treatment Plan Summary: Patient denies all crisis criteria at this time.  Disposition: No evidence of imminent risk to self or others at present.   Patient does not meet criteria for psychiatric inpatient admission.  This service was provided via telemedicine using a 2-way, interactive audio and video technology.  Names of all persons participating in this telemedicine service and their role in this encounter. Name: [name] Role: Patient  Name: [name] Clark Role: Patient's son  Name: Letitia Libra Role: [place], [place] 11/09/2019 12:55 PM

## 2019-11-09 NOTE — Progress Notes (Signed)
Attempted to intermittently catheterize patient for urine specimen however very little urine return and no enough in the catheter to send down for a specimen. Pt made aware of need for specimen and collection hat left in the bathroom, NT made aware as well and specimen cup left on bathroom door for collection when pt/if pt urinates later. Margarito Liner

## 2019-11-09 NOTE — Progress Notes (Addendum)
[facility] Progress Note   Subjective:   Patient seen and examined in room with family at bedside.  Reports feeling well.  Having a good day.  Denies SOB, edema, N/v/d.    Objective Vitals:   11/08/19 1635 11/08/19 2017 11/09/19 0036 11/09/19 0510  BP: 118/78 (!) 149/100 122/86 126/85  Pulse: 96 (!) 116 (!) 106 93  Resp: 18 19  18   Temp: 97.8 F (36.6 C) 99.5 F (37.5 C)  98.3 F (36.8 C)  TempSrc: Oral Oral  Oral  SpO2: 95% 100%  100%  Weight: 58.5 kg   60.3 kg  Height:       Physical Exam General:NAD  Heart:RRR Lungs:mostly CTAB Abdomen:soft, NTND Extremities:no edema Dialysis Access: R IJ [facility]   Filed Weights   11/08/19 1300 11/08/19 1635 11/09/19 0510  Weight: 62.5 kg 58.5 kg 60.3 kg    Intake/Output Summary (Last 24 hours) at 11/09/2019 1521 Last data filed at 11/09/2019 1300 Gross per 24 hour  Intake 600 ml  Output 1538 ml  Net -938 ml    Additional Objective Labs: Basic Metabolic Panel: Recent Labs  Lab 11/06/19 1511 11/07/19 0933 11/08/19 1254  NA 128* 129* 128*  K 5.9* 4.7 4.7  CL 90* 93* 90*  CO2 24 26 24   GLUCOSE 146* 128* 200*  BUN 48* 23 38*  CREATININE 8.97* 5.23* 7.32*  CALCIUM 9.3 8.7* 8.8*  PHOS 3.9 3.5 4.7*   Liver Function Tests: Recent Labs  Lab 11/06/19 1511 11/07/19 0933 11/08/19 1254  AST 18  --   --   ALT 12  --   --   ALKPHOS 86  --   --   BILITOT 0.2*  --   --   PROT 7.5  --   --   ALBUMIN 2.1* 2.0* 2.2*    CBC: Recent Labs  Lab 11/05/19 0528 11/06/19 0706 11/07/19 0606 11/08/19 0510 11/09/19 0731  WBC 7.7 7.1 9.1 7.8 9.2  NEUTROABS  --   --   --   --  6.7  HGB 8.5* 8.2* 9.5* 8.8* 10.3*  HCT 27.7* 26.1* 30.2* 27.9* 32.1*  MCV 94.5 92.6 92.4 92.4 92.2  PLT 192 212 221 211 272   Blood Culture    Component Value Date/Time   SDES URINE, RANDOM 11/06/2019 1107   SPECREQUEST  11/06/2019 1107    NONE Performed at [facility], [place] [address] [place], [place] [address]    CULT  80,000 COLONIES/mL YEAST (A) 11/06/2019 1107   REPTSTATUS 11/07/2019 FINAL 11/06/2019 1107    CBG: Recent Labs  Lab 11/08/19 1111 11/08/19 1719 11/08/19 2115 11/09/19 0616 11/09/19 1225  GLUCAP 219* 123* 212* 158* 135*   Iron Studies:  Recent Labs    11/07/19 0606  IRON 36  TIBC 227*  FERRITIN 605*   Lab Results  Component Value Date   INR 2.4 (H) 11/09/2019   INR 2.9 (H) 11/08/2019   INR 2.8 (H) 11/07/2019   Studies/Results: No results found.  Medications: . sodium chloride    . sodium chloride    . ceFEPime (MAXIPIME) IV 1 g (11/08/19 2047)   . acetaminophen  500 mg Oral TID  . calcium carbonate  1 tablet Oral 2 times per day  . carvedilol  12.5 mg Oral BID  . Chlorhexidine Gluconate Cloth  6 each Topical BID  . Chlorhexidine Gluconate Cloth  6 each Topical Q0600  . darbepoetin (ARANESP) injection - DIALYSIS  60 mcg Intravenous Q Mon-HD  .  doxercalciferol  1.5 mcg Intravenous Q M,W,F-HD  . feeding supplement (ENSURE ENLIVE)  237 mL Oral BID BM  . insulin aspart  0-9 Units Subcutaneous TID WC  . isosorbide mononitrate  30 mg Oral QPC lunch  . lactulose  20 g Oral TID  . Melatonin  3 mg Oral QHS  . multivitamin  1 tablet Oral QPC lunch  . warfarin  3 mg Oral ONCE-1800  . Warfarin - Pharmacist Dosing Inpatient   Does not apply q1800    Dialysis Orders: [facility]  MWF3.5 hours TDC - mult failed accesses Profile 1 - pressure drops very easily;max is pull 1.1 an hour; do not pull rinseback revaclear 300 dialyzer 400/600 2 K 2.5 Ca EDW 62.5  Epo 1200 U q HD; iron 50 mg/weekly  1.5 mcg hectoral each tx  Assessment/Plan: 1. 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 exchange for infection focus control. Seen by CTA w/no imminent plan for surgical intervention-this will be reevaluated as an outpatient following completion of antibiotic therapy with cefepime; now off cipro as of 12/14 2. ESRD:  MWF - on HD, last K 4.7. HD yesterday tolerated well. Next HD on 12/17.   3. Anemia: Hgb 8.9 > 8.8 >10.3.  started 60 Aran esp 12/14 - tsat 15% ferritin 1183 -- no Fe for the short term due to currently being treatment for infection. 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  5. 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 Ongoing inpatient rehabilitation for management of neurological deficits. Repeat MRI should noacute changes 6. Hypertension/volume :   BP well controlled. Keep SBP < 180 per neuro. - net UF 2 L Friday and 1 L Monday and 1.5L Wed. CXR 12/14 - showed some pul vasc congestion starting with goal 3 L, continue to titrate down as tolerated. 7. Nutrition - diet liberalized to regular - has supplements/multivits ordered - alb very low 8. MS change since 12/13 - no obvious findings on work up.  Per notes may have been cefepime related - seem back to baseline today.  Neurontin 100 mg per day stopped 12/14 9. Yeast UTI > 80 K- diflucan started 12/16  Jen Mow, PA-C [facility] Pager: [phone] 11/09/2019,3:21 PM  LOS: 8 days   Seen and examined. Chart reviewed. I agree with above. ESRD on HD, tolerating well. No new event. Next HD tomorrow.   Lawson Radar, MD

## 2019-11-10 ENCOUNTER — Inpatient Hospital Stay (HOSPITAL_COMMUNITY): Payer: Medicare PPO

## 2019-11-10 ENCOUNTER — Inpatient Hospital Stay (HOSPITAL_COMMUNITY): Payer: Medicare PPO | Admitting: Occupational Therapy

## 2019-11-10 ENCOUNTER — Inpatient Hospital Stay (HOSPITAL_COMMUNITY): Payer: Medicare PPO | Admitting: Speech Pathology

## 2019-11-10 LAB — RENAL FUNCTION PANEL
Albumin: 2.2 g/dL — ABNORMAL LOW (ref 3.5–5.0)
Anion gap: 15 (ref 5–15)
BUN: 33 mg/dL — ABNORMAL HIGH (ref 8–23)
CO2: 26 mmol/L (ref 22–32)
Calcium: 8.7 mg/dL — ABNORMAL LOW (ref 8.9–10.3)
Chloride: 95 mmol/L — ABNORMAL LOW (ref 98–111)
Creatinine, Ser: 7.14 mg/dL — ABNORMAL HIGH (ref 0.44–1.00)
GFR calc Af Amer: 6 mL/min — ABNORMAL LOW (ref >60)
GFR calc non Af Amer: 5 mL/min — ABNORMAL LOW (ref >60)
Glucose, Bld: 168 mg/dL — ABNORMAL HIGH (ref 70–99)
Phosphorus: 3.5 mg/dL (ref 2.5–4.6)
Potassium: 3.9 mmol/L (ref 3.5–5.1)
Sodium: 136 mmol/L (ref 135–145)

## 2019-11-10 LAB — CBC
HCT: 30.1 % — ABNORMAL LOW (ref 36.0–46.0)
HCT: 34 % — ABNORMAL LOW (ref 36.0–46.0)
Hemoglobin: 9.4 g/dL — ABNORMAL LOW (ref 12.0–15.0)
Hemoglobin: 10.8 g/dL — ABNORMAL LOW (ref 12.0–15.0)
MCH: 29.5 pg (ref 26.0–34.0)
MCH: 29.2 pg (ref 26.0–34.0)
MCHC: 31.2 g/dL (ref 30.0–36.0)
MCHC: 31.8 g/dL (ref 30.0–36.0)
MCV: 94.4 fL (ref 80.0–100.0)
MCV: 91.9 fL (ref 80.0–100.0)
Platelets: 215 10*3/uL (ref 150–400)
Platelets: 209 10*3/uL (ref 150–400)
RBC: 3.19 MIL/uL — ABNORMAL LOW (ref 3.87–5.11)
RBC: 3.7 MIL/uL — ABNORMAL LOW (ref 3.87–5.11)
RDW: 17.7 % — ABNORMAL HIGH (ref 11.5–15.5)
RDW: 17.6 % — ABNORMAL HIGH (ref 11.5–15.5)
WBC: 10 10*3/uL (ref 4.0–10.5)
WBC: 12.2 10*3/uL — ABNORMAL HIGH (ref 4.0–10.5)
nRBC: 0 % (ref 0.0–0.2)
nRBC: 0.3 % — ABNORMAL HIGH (ref 0.0–0.2)

## 2019-11-10 LAB — PROTIME-INR
INR: 2.4 — ABNORMAL HIGH (ref 0.8–1.2)
Prothrombin Time: 26.3 seconds — ABNORMAL HIGH (ref 11.4–15.2)

## 2019-11-10 LAB — GLUCOSE, CAPILLARY
Glucose-Capillary: 144 mg/dL — ABNORMAL HIGH (ref 70–99)
Glucose-Capillary: 165 mg/dL — ABNORMAL HIGH (ref 70–99)
Glucose-Capillary: 117 mg/dL — ABNORMAL HIGH (ref 70–99)
Glucose-Capillary: 162 mg/dL — ABNORMAL HIGH (ref 70–99)

## 2019-11-10 MED ORDER — WARFARIN SODIUM 2 MG PO TABS
2.0000 mg | ORAL_TABLET | Freq: Once | ORAL | Status: AC
  Administered 2019-11-10: 2 mg via ORAL
  Filled 2019-11-10: qty 1

## 2019-11-10 MED ORDER — PENTAFLUOROPROP-TETRAFLUOROETH EX AERO: 1.0000 "application " | INHALATION_SPRAY | CUTANEOUS | Status: DC | PRN

## 2019-11-10 MED ORDER — HEPARIN SODIUM (PORCINE) 1000 UNIT/ML DIALYSIS
1000.0000 [IU] | INTRAMUSCULAR | Status: DC | PRN
  Filled 2019-11-10: qty 1

## 2019-11-10 MED ORDER — HYDROCODONE-ACETAMINOPHEN 7.5-325 MG/15ML PO SOLN
ORAL | Status: AC
  Filled 2019-11-10: qty 15

## 2019-11-10 MED ORDER — DOXERCALCIFEROL 4 MCG/2ML IV SOLN
INTRAVENOUS | Status: AC
  Filled 2019-11-10: qty 2

## 2019-11-10 MED ORDER — HEPARIN SODIUM (PORCINE) 1000 UNIT/ML IJ SOLN
INTRAMUSCULAR | Status: AC
  Filled 2019-11-10: qty 4

## 2019-11-10 MED ORDER — ALTEPLASE 2 MG IJ SOLR: 2.0000 mg | Freq: Once | INTRAMUSCULAR | Status: DC | PRN

## 2019-11-10 MED ORDER — LACTULOSE ENEMA
300.0000 mL | Freq: Once | ORAL | Status: AC
  Administered 2019-11-10: 300 mL via RECTAL
  Filled 2019-11-10: qty 300

## 2019-11-10 MED ORDER — SODIUM CHLORIDE 0.9 % IV SOLN: 100.0000 mL | INTRAVENOUS | Status: DC | PRN

## 2019-11-10 MED ORDER — LIDOCAINE-PRILOCAINE 2.5-2.5 % EX CREA
1.0000 "application " | TOPICAL_CREAM | CUTANEOUS | Status: DC | PRN
  Filled 2019-11-10: qty 5

## 2019-11-10 MED ORDER — LIDOCAINE HCL (PF) 1 % IJ SOLN
5.0000 mL | INTRAMUSCULAR | Status: DC | PRN
  Filled 2019-11-10: qty 5

## 2019-11-10 MED ORDER — HEPARIN SODIUM (PORCINE) 1000 UNIT/ML DIALYSIS
40.0000 [IU]/kg | INTRAMUSCULAR | Status: DC | PRN
  Filled 2019-11-10: qty 3

## 2019-11-10 NOTE — Progress Notes (Signed)
Occupational Therapy Weekly Progress Note  Patient Details  Name: [name] MRN: [id number] Date of Birth: 05/12/1951  Beginning of progress report period: November 02, 2019 End of progress report period: November 10, 2019  Today's Date: 11/10/2019 OT Individual Time: 5809-9833 OT Individual Time Calculation (min): 55 min    Patient has met 1 of 4 short term goals.  Pt is making slow progress towards goals limited by severe back pain and nausea with vomiting.  Pt lost 24-28 hours due to medical issues impacting participation in therapy sessions.  Pt continues to require max assist squat pivot transfers and max assist LB bathing/dressing.    Patient continues to demonstrate the following deficits: muscle weakness, decreased cardiorespiratoy endurance and decreased sitting balance, decreased standing balance and decreased balance strategies and therefore will continue to benefit from skilled OT intervention to enhance overall performance with BADL and Reduce care partner burden.  Patient progressing toward long term goals..  Continue plan of care.  OT Short Term Goals Week 1:  OT Short Term Goal 1 (Week 1): Pt will perform shower transfer with mod A. OT Short Term Goal 1 - Progress (Week 1): Progressing toward goal OT Short Term Goal 2 (Week 1): Pt will perform toileting with mod A. OT Short Term Goal 2 - Progress (Week 1): Progressing toward goal OT Short Term Goal 3 (Week 1): Pt will don back brace with min A overall. OT Short Term Goal 3 - Progress (Week 1): Not met OT Short Term Goal 4 (Week 1): Pt will perform LB dressing with max A overall. OT Short Term Goal 4 - Progress (Week 1): Met Week 2:  OT Short Term Goal 1 (Week 2): Pt will perform shower transfer with mod A. OT Short Term Goal 2 (Week 2): Pt will perform toileting with mod A. OT Short Term Goal 3 (Week 2): Pt will complete LB dressing with mod assist OT Short Term Goal 4 (Week 2): Pt will tolerate sitting OOB 2  hrs to demonstrate increased activity tolerance  Skilled Therapeutic Interventions/Progress Updates:    Treatment session with focus on functional transfers, sit > stand, dynamic sitting balance and overall activity tolerance during self-care tasks.  Pt received up in [place] with nursing staff transporting to toilet.  Pt required increased time to attempt BM with no success.  Pt agreeable to shower.  Since up in [place], completed transfer toilet> shower chair with Stedy.  Pt able to complete sit > stand with Mod assist when pulling up on Stedy bar.  Pt able to complete UB bathing with increased time and cues to visually attend to LUE while washing Rt arm.  Pt completed sit > partial stand in shower with max assist to allow therapist to wash buttocks.  Heavy reliance on grab bars for sit > stand and standing balance.  Completed UB dressing seated EOB with min assist and min guard for sitting balance.  Donned incontinence brief at sit > stand level in [place] to allow increased focus on standing.  Donned pants in supine with pt requiring assistance to thread BLE, but able to pull pants over knees and over hips with combination of bridging and rolling.  Pt remained in sidelying at end of session with son arriving.  Therapy Documentation Precautions:  Precautions Precautions: Fall Precaution Comments: Strong L lateral lean, TLSO for mobility Required Braces or Orthoses: Other Brace(TLSO) Spinal Brace: Thoracolumbosacral orthotic Spinal Brace Comments: for comfort with mobility Restrictions Weight Bearing Restrictions: No General:   Vital  Signs: Therapy Vitals Temp: 98.3 F (36.8 C) Temp Source: Oral Pulse Rate: 87 Resp: 18 BP: (!) 145/91 Patient Position (if appropriate): Lying Oxygen Therapy SpO2: 100 % O2 Device: Room Air Pain:  Pt with no c/o pain   Therapy/Group: Individual Therapy  [name] 11/10/2019, 9:12 AM

## 2019-11-10 NOTE — Progress Notes (Signed)
Physical Therapy Session Note  Patient Details  Name: [name] MRN: [id number] Date of Birth: [birth year]  Today's Date: 11/10/2019 PT Individual Time: 1100-1158 PT Individual Time Calculation (min): 58 min   Short Term Goals: Week 1:  PT Short Term Goal 1 (Week 1): pt will transfer supine<>sit and sit<>supine min A PT Short Term Goal 1 - Progress (Week 1): Progressing toward goal PT Short Term Goal 2 (Week 1): Pt will transfer stand<>pivot with LRAD mod A PT Short Term Goal 2 - Progress (Week 1): Progressing toward goal PT Short Term Goal 3 (Week 1): Pt will perform car transfer with LRAD mod A PT Short Term Goal 3 - Progress (Week 1): Progressing toward goal Week 2:  PT Short Term Goal 1 (Week 2): Pt will transfer supine<>sit and sit<>supine with min A PT Short Term Goal 2 (Week 2): Pt will transfer stand<>pivot with LRAD mod A PT Short Term Goal 3 (Week 2): Pt will perform car transfer with LRAD mod A  Skilled Therapeutic Interventions/Progress Updates:   Received pt supine in bed, pt agreeable to therapy, and stated she has no pain when laying in bed, but her back continues to get stiff with mobility. Session focused on functional mobility/transfers, ambulation with RW, LE strength, balance/coordination, and improved tolerance to activity. Pt transferred supine<>sit EOB with mod A for trunk control and LE management despite being educated on logroll technique. Pt transferred lateral scoot to R from bed<>WC with mod A. Therapist donned TLSO with total assist with pt sitting in [place]. Pt performed WC mobility 60ft with bilateral UE's and min A for hand over hand technique and to avoid obstacles on L side. Pt ambulated 52ft with RW +2 assist for WC follow and initially to maintain balance on R. Pt demonstrated flexed trunk, decreased bilateral knee extension, decreased stride length, and decreased trunk rotation. Pt required max verbal cues for terminal knee extension, for hand placement  when pushing up from wheelchair, and to avoid obstacles on R. Pt ambulated additional 29ft x 1, 75ft x 1, and 68ft x 1 with RW +2 for WC follow; pt determined to return to room. Pt required multiple rest breaks throughout session due to increased fatigue and bilateral LE weakness. Pt reported that her back felt fine when she was up and walking but when she sits down it "stiffens up". Pt requested to return to bed. In supine pt performed bilateral lower trunk rotation 5x15 second hold with verbal cues for technique. Pt performed bilateral SKTC 3x30 seconds. Therapist performed passive hamstring stretch 1x30 seconds. Pt reported relief and decreased low back stiffness after stretches. Concluded session with pt supine in bed, needs within reach, and bed alarm on.   Therapy Documentation Precautions:  Precautions Precautions: Fall Precaution Comments: Strong L lateral lean, TLSO for mobility Required Braces or Orthoses: Other Brace(TLSO) Spinal Brace: Thoracolumbosacral orthotic Spinal Brace Comments: for comfort with mobility Restrictions Weight Bearing Restrictions: No   Therapy/Group: Individual Therapy Alfonse Alpers PT, DPT   11/10/2019, 7:41 AM

## 2019-11-10 NOTE — Progress Notes (Signed)
[facility] Progress Note   Subjective:   Patient seen and examined in room with family at bedside.  Reports feeling well.  Having a good day.  Denies SOB, edema, N/v/d.    Objective Vitals:   11/10/19 1430 11/10/19 1500 11/10/19 1530 11/10/19 1600  BP: 125/81 100/68 123/79 (!) 120/91  Pulse: 92 91 73 (!) 57  Resp: 16 16 16 16   Temp:      TempSrc:      SpO2:      Weight:      Height:       Physical Exam General: Sitting on bed comfortable, not in distress Heart:RRR, no rub Lungs: Clear bilateral, no wheezing Abdomen:soft, NTND Extremities:no edema Dialysis Access: R IJ [facility]   Filed Weights   11/08/19 1635 11/09/19 0510 11/10/19 0538  Weight: 58.5 kg 60.3 kg 64.4 kg    Intake/Output Summary (Last 24 hours) at 11/10/2019 1626 Last data filed at 11/10/2019 0951 Gross per 24 hour  Intake 240 ml  Output --  Net 240 ml    Additional Objective Labs: Basic Metabolic Panel: Recent Labs  Lab 11/07/19 0933 11/08/19 1254 11/10/19 1405  NA 129* 128* 136  K 4.7 4.7 3.9  CL 93* 90* 95*  CO2 26 24 26   GLUCOSE 128* 200* 168*  BUN 23 38* 33*  CREATININE 5.23* 7.32* 7.14*  CALCIUM 8.7* 8.8* 8.7*  PHOS 3.5 4.7* 3.5   Liver Function Tests: Recent Labs  Lab 11/06/19 1511 11/07/19 0933 11/08/19 1254 11/10/19 1405  AST 18  --   --   --   ALT 12  --   --   --   ALKPHOS 86  --   --   --   BILITOT 0.2*  --   --   --   PROT 7.5  --   --   --   ALBUMIN 2.1* 2.0* 2.2* 2.2*    CBC: Recent Labs  Lab 11/06/19 0706 11/07/19 0606 11/08/19 0510 11/09/19 0731 11/10/19 1405  WBC 7.1 9.1 7.8 9.2 10.0  NEUTROABS  --   --   --  6.7  --   HGB 8.2* 9.5* 8.8* 10.3* 9.4*  HCT 26.1* 30.2* 27.9* 32.1* 30.1*  MCV 92.6 92.4 92.4 92.2 94.4  PLT 212 221 211 272 215   Blood Culture    Component Value Date/Time   SDES URINE, RANDOM 11/06/2019 1107   SPECREQUEST  11/06/2019 1107    NONE Performed at [facility], [place] [address] [place], [place] [address]     CULT 80,000 COLONIES/mL YEAST (A) 11/06/2019 1107   REPTSTATUS 11/07/2019 FINAL 11/06/2019 1107    CBG: Recent Labs  Lab 11/09/19 1614 11/09/19 1733 11/09/19 2117 11/10/19 0558 11/10/19 1158  GLUCAP 199* 197* 208* 144* 165*   Iron Studies:  No results for input(s): IRON, TIBC, TRANSFERRIN, FERRITIN in the last 72 hours. Lab Results  Component Value Date   INR 2.4 (H) 11/10/2019   INR 2.4 (H) 11/09/2019   INR 2.9 (H) 11/08/2019   Studies/Results: No results found.  Medications: . sodium chloride    . sodium chloride    . ceFEPime (MAXIPIME) IV 1 g (11/09/19 2005)   . doxercalciferol      . acetaminophen  500 mg Oral TID  . calcium carbonate  1 tablet Oral 2 times per day  . carvedilol  12.5 mg Oral BID  . Chlorhexidine Gluconate Cloth  6 each Topical BID  . Chlorhexidine Gluconate Cloth  6 each Topical Q0600  . Chlorhexidine Gluconate Cloth  6 each Topical Q0600  . darbepoetin (ARANESP) injection - DIALYSIS  60 mcg Intravenous Q Mon-HD  . doxercalciferol  1.5 mcg Intravenous Q M,W,F-HD  . feeding supplement (ENSURE ENLIVE)  237 mL Oral BID BM  . insulin aspart  0-9 Units Subcutaneous TID WC  . isosorbide mononitrate  30 mg Oral QPC lunch  . lactulose  20 g Oral TID  . Melatonin  3 mg Oral QHS  . multivitamin  1 tablet Oral QPC lunch  . warfarin  2 mg Oral ONCE-1800  . Warfarin - Pharmacist Dosing Inpatient   Does not apply q1800    Dialysis Orders: [facility]  MWF3.5 hours TDC - mult failed accesses Profile 1 - pressure drops very easily;max is pull 1.1 an hour; do not pull rinseback revaclear 300 dialyzer 400/600 2 K 2.5 Ca EDW 62.5  Epo 1200 U q HD; iron 50 mg/weekly  1.5 mcg hectoral each tx  Assessment/Plan: 1. Recurrent Pseudomonas bacteremia (Likely HD CRB) with evidence of mitral valve endocarditisthat is post [facility] exchange for infection focus control. Seen by CTA w/no imminent plan for surgical intervention-this will be reevaluated as an outpatient  following completion of antibiotic therapy with cefepime; now off cipro as of 12/14 2. ESRD: MWF - on HD, plan for HD today. 3. Anemia: Monitor hemoglobin, continue ESA -- no Fe for the short term due to currently being treatment for infection. 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  5. 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 Ongoing inpatient rehabilitation for management of neurological deficits. Repeat MRI should noacute changes 6. Hypertension/volume :   BP well controlled. Keep SBP < 180 per neuro. - net UF 2 L Friday and 1 L Monday and 1.5L Wed. CXR 12/14 - showed some pul vasc congestion starting with goal 3 L, continue to titrate down as tolerated. 7. Nutrition - diet liberalized to regular - has supplements/multivits ordered - alb very low 8. MS change since 12/13 - no obvious findings on work up.  Per notes may have been cefepime related - seem back to baseline today.  Neurontin 100 mg per day stopped 12/14 9. Yeast UTI > 80 K- diflucan started 12/16   Lawson Radar, MD

## 2019-11-10 NOTE — Progress Notes (Signed)
ANTICOAGULATION CONSULT NOTE - Follow-Up  Pharmacy Consult for Warfarin Indication: RIJ DVT (10/21/19), CVA (10/20/19)  No Known Allergies  Patient Measurements: Height: 5\' 7"  (170.2 cm) Weight: 141 lb 15.6 oz (64.4 kg) IBW/kg (Calculated) : 61.6  Vital Signs: Temp: 98.3 F (36.8 C) (12/18 0538) Temp Source: Oral (12/18 0538) BP: 145/91 (12/18 0538) Pulse Rate: 87 (12/18 0538)  Labs: Recent Labs    11/08/19 0510 11/08/19 1254 11/09/19 0731 11/10/19 0639  HGB 8.8*  --  10.3*  --   HCT 27.9*  --  32.1*  --   PLT 211  --  272  --   LABPROT 30.0*  --  25.9* 26.3*  INR 2.9*  --  2.4* 2.4*  CREATININE  --  7.32*  --   --     Estimated Creatinine Clearance: 7.1 mL/min (A) (by C-G formula based on SCr of 7.32 mg/dL (H)).  Assessment: 68 yr old female with ESRD who presented on 10/20/19 with new CVA and RIJ DVT 10/21/19. Pharmacy consulted to dose Warfarin.   Warfarin started 12/4. Ciprofloxacin stopped 12/14, expect drug interaction with Warfarin to have resolved, will increase dose today. Fluconazole x1 given 12/16, also increases warfarin sensitivity.  INR today is therapeutic at 2.4. H/H & plt stable . No bleeding noted.   Goal of Therapy:  INR 2-3 Monitor platelets by anticoagulation protocol: Yes   Plan:  - Warfarin 2 mg x1 tonight - Daily PT/INR, CBC Q Mon - Will continue to monitor for any signs/symptoms of bleeding and will follow up with PT/INR in the a.m.  Hermen Mario A. Levada Dy, PharmD, BCPS, FNKF Clinical Pharmacist  Please utilize Amion for appropriate phone number to reach the unit pharmacist ([place])

## 2019-11-10 NOTE — Progress Notes (Signed)
[facility] PHYSICAL MEDICINE & REHABILITATION PROGRESS NOTE   Subjective/Complaints: Patient seen sitting up in her bed this morning she states she slept well overnight, confirmed with nursing.  She asks appropriate questions, such as timing of her dialysis.  She was seen by psychiatry yesterday, notes reviewed.  ROS: Denies CP, SOB, N/V/D  Objective:   No results found. Recent Labs    11/08/19 0510 11/09/19 0731  WBC 7.8 9.2  HGB 8.8* 10.3*  HCT 27.9* 32.1*  PLT 211 272   Recent Labs    11/08/19 1254  NA 128*  K 4.7  CL 90*  CO2 24  GLUCOSE 200*  BUN 38*  CREATININE 7.32*  CALCIUM 8.8*    Intake/Output Summary (Last 24 hours) at 11/10/2019 1009 Last data filed at 11/10/2019 0951 Gross per 24 hour  Intake 480 ml  Output -  Net 480 ml     Physical Exam: Vital Signs Blood pressure (!) 145/91, pulse 87, temperature 98.3 F (36.8 C), temperature source Oral, resp. rate 18, height 5\' 7"  (1.702 m), weight 64.4 kg, SpO2 100 %. Constitutional: No distress . Vital signs reviewed. HENT: Normocephalic.  Atraumatic. Eyes: EOMI. No discharge. Cardiovascular: No JVD. Respiratory: Normal effort.  No stridor. GI: Non-distended. Skin: Warm and dry.  Intact. Psych: Flat. Musc: No edema in extremities.  No tenderness in extremities. Neurological: Alert and oriented x3 Motor: RUE: 5/5 proximal to distal RLE: HF, KE 3+/5, ADF 4/5 LUE: 4+/5 proximal to distal LLE: HF, KE 3/5, ADF 3+/5  Assessment/Plan: 1. Functional deficits secondary to right basal ganglia stroke which require 3+ hours per day of interdisciplinary therapy in a comprehensive inpatient rehab setting.  Physiatrist is providing close team supervision and 24 hour management of active medical problems listed below.  Physiatrist and rehab team continue to assess barriers to discharge/monitor patient progress toward functional and medical goals  Care Tool:  Bathing    Body parts bathed by patient: Face,  Left arm, Chest, Abdomen   Body parts bathed by helper: Right arm     Bathing assist Assist Level: Minimal Assistance - Patient > 75%     Upper Body Dressing/Undressing Upper body dressing   What is the patient wearing?: Pull over shirt    Upper body assist Assist Level: Maximal Assistance - Patient 25 - 49%    Lower Body Dressing/Undressing Lower body dressing      What is the patient wearing?: Pants     Lower body assist Assist for lower body dressing: Maximal Assistance - Patient 25 - 49%     Toileting Toileting    Toileting assist Assist for toileting: Moderate Assistance - Patient 50 - 74%     Transfers Chair/bed transfer  Transfers assist     Chair/bed transfer assist level: Maximal Assistance - Patient 25 - 49%     Locomotion Ambulation   Ambulation assist   Ambulation activity did not occur: Safety/medical concerns(back pain, bilateral LE weakness, poor bilateral knee control)          Walk 10 feet activity   Assist  Walk 10 feet activity did not occur: Safety/medical concerns(back pain, bilateral LE weakness, poor bilateral knee control)        Walk 50 feet activity   Assist Walk 50 feet with 2 turns activity did not occur: Safety/medical concerns(back pain, bilateral LE weakness, poor bilateral knee control)         Walk 150 feet activity   Assist Walk 150 feet activity did not occur:  Safety/medical concerns(back pain, bilateral LE weakness, poor bilateral knee control)         Walk 10 feet on uneven surface  activity   Assist Walk 10 feet on uneven surfaces activity did not occur: Safety/medical concerns(back pain, bilateral LE weakness, poor bilateral knee control)         Wheelchair     Assist Will patient use wheelchair at discharge?: Yes Type of Wheelchair: Manual Wheelchair activity did not occur: Safety/medical concerns(Pt could not tolerate sitting in WC for long enough to complete activity due to low  back pain)  Wheelchair assist level: Minimal Assistance - Patient > 75% Max wheelchair distance: 39ft    Wheelchair 50 feet with 2 turns activity    Assist    Wheelchair 50 feet with 2 turns activity did not occur: Safety/medical concerns(Pt could not tolerate sitting in WC for long enough to complete activity due to low back pain)       Wheelchair 150 feet activity     Assist  Wheelchair 150 feet activity did not occur: Safety/medical concerns(Pt could not tolerate sitting in WC for long enough to complete activity due to low back pain)       Blood pressure (!) 145/91, pulse 87, temperature 98.3 F (36.8 C), temperature source Oral, resp. rate 18, height 5\' 7"  (1.702 m), weight 64.4 kg, SpO2 100 %.    Medical Problem List and Plan: 1.  Impaired gait with left side weakness secondary to right basal ganglia infarction as well as small cortical infarct left frontal lobe  Continue CIR  CT head on 12/13 personally reviewed, showing some improvement, MRI personally reviewed, stable  Chest x-ray personally reviewed, unremarkable for infection  Discussed with neurology results of the EEG -findings possibly related to uremia +/- cefepime  Discussed with psychiatry, continue medical management 2.  Antithrombotics: -DVT/anticoagulation: Chronic Coumadin for right IJ acute DVT.  Plan anticoagulation x3 months  INR therapeutic on 12/18             -antiplatelet therapy: N/A 3. Pain Management chronic back and left hip pain: Neurontin 100 mg nightly, oxycodone as needed.  TLSO back brace for comfort 4. Mood: Provide emotional support             -antipsychotic agents: N/A 5. Neuropsych: This patient is not capable of making decisions on her own behalf. 6. Skin/Wound Care: Routine skin checks 7. Fluids/Electrolytes/Nutrition: Routine in and outs 8.  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  New HD  tunneled catheter placed 10/26/2019. 9.  ID/recurrent Pseudomonas bacteremia.    Continue Maxipime as directed with infectious disease to discuss duration of antibiotic therapies anticipate 6 weeks.  Cipro DC'd by ID due to seizure risk 10.  Hemodialysis MWF.  Follow-up per renal services.  11.  Diabetes mellitus.  Hemoglobin A1c 7.6.  SSI.  Labile on 12/18 12.  Hypertension.  Imdur 30 mg daily  Coreg 6.25 mg twice daily, increased to 12.5 on 12/15.    Monitor with increased mobility.  Elevated on 12/18, recs per nephro 13.  Acute on chronic anemia.    Hemoglobin 10.3 on 12/17  Continue to monitor 14. Constipation:  See #16 15.  Candidiasis  Diflucan ordered on 12/16  Repeat Ucx pending 16.  Hyperammonemia with hepatic encephalopathy  Lactulose started on 12/17  ?Improving 17. Sleep disturance  Melatonin added on 12/17  ?Improving  LOS: 9 days A FACE TO FACE EVALUATION WAS PERFORMED  Ankit  Lorie Phenix 11/10/2019, 10:09 AM

## 2019-11-10 NOTE — Progress Notes (Signed)
Speech Language Pathology Daily Session Note  Patient Details  Name: [name] MRN: [id number] Date of Birth: [birth year]  Today's Date: 11/10/2019 SLP Individual Time: 0725-0810 SLP Individual Time Calculation (min): 45 min  Short Term Goals: Week 2: SLP Short Term Goal 1 (Week 2): Pt will demonstrate functional problem solving during basic tasks with Min A verbal/visual cues SLP Short Term Goal 2 (Week 2): Pt will demonstrate recall of day to day/new information with Min A verbal cues for use of compensatory memory strategies SLP Short Term Goal 3 (Week 2): Pt will selectively attend to functional tasks with Supervision A verbal cues for redirection SLP Short Term Goal 4 (Week 2): Pt will demonstrate anticipatory awareness by stating 2 ADLs she will need assistance with upon her return home with Min A verbal/visual cues  Skilled Therapeutic Interventions: Skilled treatment session focused on cognitive goals. SLP facilitated session by providing extra time and overall Min a verbal cues for basic problem solving during a 4 and 6 step picture sequencing task. Patient demonstrated sustained attention to tasks for ~30 minutes with overall Min A verbal cues for redirection and was independently oriented X 4 today. Patient left supine in bed with alarm on and all needs within reach. Continue with current plan of care.      Pain Intermittent back pain while in bed, patient repositioned.   Therapy/Group: Individual Therapy  Akaysha Cobern 11/10/2019, 1:48 PM

## 2019-11-10 NOTE — Progress Notes (Addendum)
Patient refused her lactulose stating that we keep giving her stuff to drink to make her have a BM but it's not working. Explained to patient that what she had today was lactulose to help decrease her ammonia levels, but she is not understanding. She said that she has not had a BM since the first time it was given to her. What she was referring to was sorbitol that she was given last week prior to an enema. She still refused interventions. On call provider was called & he ordered a lactulose enema. When received from pharmacy, other supplies were ordered to administer. Procedure was explained to the patient who was c/o abdominal & back pain that she stated that she has had all day, pain medications did not help. Balloon catheter was placed & enema was instilled. Patient was forcing it out before the procedure was completed. She was encouraged to hold it as long as she could because the lactulose was to stay in for 30 minutes to a hour. She was made comfortable in bed while waiting for the time to pass & encouraged not to push the catheter out. Will monitor for results

## 2019-11-11 ENCOUNTER — Inpatient Hospital Stay (HOSPITAL_COMMUNITY): Payer: Medicare PPO | Admitting: Speech Pathology

## 2019-11-11 LAB — GLUCOSE, CAPILLARY
Glucose-Capillary: 189 mg/dL — ABNORMAL HIGH (ref 70–99)
Glucose-Capillary: 179 mg/dL — ABNORMAL HIGH (ref 70–99)
Glucose-Capillary: 250 mg/dL — ABNORMAL HIGH (ref 70–99)
Glucose-Capillary: 182 mg/dL — ABNORMAL HIGH (ref 70–99)
Glucose-Capillary: 175 mg/dL — ABNORMAL HIGH (ref 70–99)

## 2019-11-11 LAB — RENAL FUNCTION PANEL
Albumin: 2.4 g/dL — ABNORMAL LOW (ref 3.5–5.0)
Anion gap: 14 (ref 5–15)
BUN: 9 mg/dL (ref 8–23)
CO2: 27 mmol/L (ref 22–32)
Calcium: 8.5 mg/dL — ABNORMAL LOW (ref 8.9–10.3)
Chloride: 93 mmol/L — ABNORMAL LOW (ref 98–111)
Creatinine, Ser: 3.17 mg/dL — ABNORMAL HIGH (ref 0.44–1.00)
GFR calc Af Amer: 17 mL/min — ABNORMAL LOW (ref >60)
GFR calc non Af Amer: 14 mL/min — ABNORMAL LOW (ref >60)
Glucose, Bld: 173 mg/dL — ABNORMAL HIGH (ref 70–99)
Phosphorus: 2.2 mg/dL — ABNORMAL LOW (ref 2.5–4.6)
Potassium: 3.3 mmol/L — ABNORMAL LOW (ref 3.5–5.1)
Sodium: 134 mmol/L — ABNORMAL LOW (ref 135–145)

## 2019-11-11 LAB — PROTIME-INR
INR: 2.9 — ABNORMAL HIGH (ref 0.8–1.2)
Prothrombin Time: 30.6 seconds — ABNORMAL HIGH (ref 11.4–15.2)

## 2019-11-11 MED ORDER — LACTULOSE ENEMA
300.0000 mL | Freq: Once | ORAL | Status: AC
  Administered 2019-11-11: 300 mL via RECTAL
  Filled 2019-11-11: qty 300

## 2019-11-11 MED ORDER — ONDANSETRON 4 MG PO TBDP
2.0000 mg | ORAL_TABLET | Freq: Three times a day (TID) | ORAL | Status: DC | PRN
  Administered 2019-11-11 – 2019-11-28 (×18): 2 mg via ORAL
  Filled 2019-11-11 (×22): qty 0.5

## 2019-11-11 NOTE — Progress Notes (Signed)
Patient did have large results last night. Hygiene care was given & she stated that she felt much better. Communication put in for the provider on rounds to evaluate for a daily stool regimen. No acute distress noted. Will continue to monitor

## 2019-11-11 NOTE — Progress Notes (Signed)
[place] PHYSICAL MEDICINE & REHABILITATION PROGRESS NOTE   Subjective/Complaints: Patient seen laying in bed this morning.  Overnight patient refusing medications, but ultimately amenable to lactulose enema with results.  She is confused again, but just waking up.  ROS: Limited due to cognition  Objective:   No results found. Recent Labs    11/10/19 1405 11/10/19 1759  WBC 10.0 12.2*  HGB 9.4* 10.8*  HCT 30.1* 34.0*  PLT 215 209   Recent Labs    11/10/19 1405 11/10/19 1759  NA 136 134*  K 3.9 3.3*  CL 95* 93*  CO2 26 27  GLUCOSE 168* 173*  BUN 33* 9  CREATININE 7.14* 3.17*  CALCIUM 8.7* 8.5*    Intake/Output Summary (Last 24 hours) at 11/11/2019 1535 Last data filed at 11/11/2019 1300 Gross per 24 hour  Intake 250 ml  Output --  Net 250 ml     Physical Exam: Vital Signs Blood pressure (!) 94/55, pulse 96, temperature 98.4 F (36.9 C), resp. rate 19, height 5\' 7"  (1.702 m), weight 61.7 kg, SpO2 97 %. Constitutional: No distress . Vital signs reviewed. HENT: Normocephalic.  Atraumatic. Eyes: EOMI. No discharge. Cardiovascular: No JVD. Respiratory: Normal effort.  No stridor. GI: Non-distended. Skin: Warm and dry.  Intact. Psych: Flat.  Confused. Musc: No edema in extremities.  No tenderness in extremities. Neurological: Alert and x1 Motor: RUE: 5/5 proximal to distal RLE: HF, KE 3+/5, ADF 4/5, unchanged LUE: 4+/5 proximal to distal LLE: HF, KE 3/5, ADF 3+/5, unchanged  Assessment/Plan: 1. Functional deficits secondary to right basal ganglia stroke which require 3+ hours per day of interdisciplinary therapy in a comprehensive inpatient rehab setting.  Physiatrist is providing close team supervision and 24 hour management of active medical problems listed below.  Physiatrist and rehab team continue to assess barriers to discharge/monitor patient progress toward functional and medical goals  Care Tool:  Bathing    Body parts bathed by patient:  Right arm, Left arm, Chest, Abdomen, Front perineal area, Right upper leg, Left upper leg, Face   Body parts bathed by helper: Buttocks, Right lower leg, Left lower leg     Bathing assist Assist Level: Minimal Assistance - Patient > 75%     Upper Body Dressing/Undressing Upper body dressing   What is the patient wearing?: Pull over shirt    Upper body assist Assist Level: Minimal Assistance - Patient > 75%    Lower Body Dressing/Undressing Lower body dressing      What is the patient wearing?: Pants, Incontinence brief     Lower body assist Assist for lower body dressing: Maximal Assistance - Patient 25 - 49%     Toileting Toileting    Toileting assist Assist for toileting: Maximal Assistance - Patient 25 - 49%     Transfers Chair/bed transfer  Transfers assist     Chair/bed transfer assist level: Moderate Assistance - Patient 50 - 74%     Locomotion Ambulation   Ambulation assist   Ambulation activity did not occur: Safety/medical concerns(back pain, bilateral LE weakness, poor bilateral knee control)  Assist level: 2 helpers Assistive device: Walker-rolling Max distance: 55ft   Walk 10 feet activity   Assist  Walk 10 feet activity did not occur: Safety/medical concerns(back pain, bilateral LE weakness, poor bilateral knee control)  Assist level: 2 helpers Assistive device: Walker-rolling   Walk 50 feet activity   Assist Walk 50 feet with 2 turns activity did not occur: Safety/medical concerns(back pain, bilateral LE weakness, poor  bilateral knee control)  Assist level: 2 helpers Assistive device: Walker-rolling    Walk 150 feet activity   Assist Walk 150 feet activity did not occur: Safety/medical concerns(back pain, bilateral LE weakness, poor bilateral knee control)         Walk 10 feet on uneven surface  activity   Assist Walk 10 feet on uneven surfaces activity did not occur: Safety/medical concerns(back pain, bilateral LE  weakness, poor bilateral knee control)         Wheelchair     Assist Will patient use wheelchair at discharge?: Yes Type of Wheelchair: Manual Wheelchair activity did not occur: Safety/medical concerns(Pt could not tolerate sitting in WC for long enough to complete activity due to low back pain)  Wheelchair assist level: Minimal Assistance - Patient > 75% Max wheelchair distance: 71ft    Wheelchair 50 feet with 2 turns activity    Assist    Wheelchair 50 feet with 2 turns activity did not occur: Safety/medical concerns(Pt could not tolerate sitting in WC for long enough to complete activity due to low back pain)       Wheelchair 150 feet activity     Assist  Wheelchair 150 feet activity did not occur: Safety/medical concerns(Pt could not tolerate sitting in WC for long enough to complete activity due to low back pain)       Blood pressure (!) 94/55, pulse 96, temperature 98.4 F (36.9 C), resp. rate 19, height 5\' 7"  (1.702 m), weight 61.7 kg, SpO2 97 %.    Medical Problem List and Plan: 1.  Impaired gait with left side weakness secondary to right basal ganglia infarction as well as small cortical infarct left frontal lobe  Continue CIR  CT head on 12/13 personally reviewed, showing some improvement, MRI personally reviewed, stable  Chest x-ray personally reviewed, unremarkable for infection  Discussed with neurology results of the EEG -findings possibly related to uremia +/- cefepime  Discussed with psychiatry, continue medical management 2.  Antithrombotics: -DVT/anticoagulation: Chronic Coumadin for right IJ acute DVT.  Plan anticoagulation x3 months  INR therapeutic on 12/19             -antiplatelet therapy: N/A 3. Pain Management chronic back and left hip pain: Neurontin 100 mg nightly, oxycodone as needed.  TLSO back brace for comfort 4. Mood: Provide emotional support             -antipsychotic agents: N/A 5. Neuropsych: This patient is not capable of  making decisions on her own behalf. 6. Skin/Wound Care: Routine skin checks 7. Fluids/Electrolytes/Nutrition: Routine in and outs 8.  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  New HD tunneled catheter placed 10/26/2019. 9.  ID/recurrent Pseudomonas bacteremia.    Continue Maxipime as directed with infectious disease to discuss duration of antibiotic therapies anticipate 6 weeks.  Cipro DC'd by ID due to seizure risk 10.  Hemodialysis MWF.  Follow-up per renal services.  11.  Diabetes mellitus.  Hemoglobin A1c 7.6.  SSI.  Elevated on 12/19, medication adjustments if persistent 12.  Hypertension.  Imdur 30 mg daily  Coreg 6.25 mg twice daily, increased to 12.5 on 12/15.    Monitor with increased mobility.  Labile on 12/19 13.  Acute on chronic anemia.    Hemoglobin 10.8 on 12/18  Continue to monitor 14. Constipation:  See #16  Improving 15.  Candidiasis  Diflucan ordered on 12/16  Repeat Ucx remains pending 16.  Hyperammonemia with hepatic encephalopathy  Lactulose started on 12/17, continue 17. Sleep disturance  Melatonin added on 12/17  Sleep chart ordered 18.  Leukocytosis  WBCs 12.2 on 12/18, labs with HD  Afebrile  Continue to monitor   LOS: 10 days A FACE TO FACE EVALUATION WAS PERFORMED  Eulalio Reamy Lorie Phenix 11/11/2019, 3:35 PM

## 2019-11-11 NOTE — Progress Notes (Signed)
SLP Cancellation Note  Patient Details Name: [name] MRN: [id number] DOB: 07/19/1951   Cancelled treatment:       Patient missed 45 minutes of skilled SLP intervention due to fatigue and not feeling well. Per report, patient vomiting this morning and declined participation X 2 attempts due to feeling sick on her stomach. RN aware.                                                                                                 [place], Norman 11/11/2019, 1:01 PM

## 2019-11-11 NOTE — Progress Notes (Signed)
[facility] Progress Note   Subjective:   Patient seen and examined at bedside with son present.  Son reports n/v and back pain this AM.  Patient reports these symptoms have now resolved.  Denies SOB, CP, n/v/d, abdominal pain, and back pain.   Objective Vitals:   11/10/19 1655 11/10/19 1954 11/11/19 0522 11/11/19 1322  BP: (!) 141/92 (!) 138/94 114/77 (!) 94/55  Pulse: 72 (!) 102 (!) 108 96  Resp: 16 16 16 19   Temp: 97.6 F (36.4 C) 98.4 F (36.9 C) 99.7 F (37.6 C) 98.4 F (36.9 C)  TempSrc:      SpO2:  100% 95% 97%  Weight:   61.7 kg   Height:       Physical Exam General:NAD, female, laying in bed Heart:RRR Lungs:CTAB Abdomen:soft, NTND Extremities:no edema Dialysis Access: R IJ [facility]   Filed Weights   11/10/19 1313 11/10/19 1555 11/11/19 0522  Weight: 58.5 kg 57.8 kg 61.7 kg    Intake/Output Summary (Last 24 hours) at 11/11/2019 1451 Last data filed at 11/11/2019 0900 Gross per 24 hour  Intake 240 ml  Output --  Net 240 ml    Additional Objective Labs: Basic Metabolic Panel: Recent Labs  Lab 11/08/19 1254 11/10/19 1405 11/10/19 1759  NA 128* 136 134*  K 4.7 3.9 3.3*  CL 90* 95* 93*  CO2 24 26 27   GLUCOSE 200* 168* 173*  BUN 38* 33* 9  CREATININE 7.32* 7.14* 3.17*  CALCIUM 8.8* 8.7* 8.5*  PHOS 4.7* 3.5 2.2*   Liver Function Tests: Recent Labs  Lab 11/06/19 1511 11/08/19 1254 11/10/19 1405 11/10/19 1759  AST 18  --   --   --   ALT 12  --   --   --   ALKPHOS 86  --   --   --   BILITOT 0.2*  --   --   --   PROT 7.5  --   --   --   ALBUMIN 2.1* 2.2* 2.2* 2.4*   CBC: Recent Labs  Lab 11/07/19 0606 11/08/19 0510 11/09/19 0731 11/10/19 1405 11/10/19 1759  WBC 9.1 7.8 9.2 10.0 12.2*  NEUTROABS  --   --  6.7  --   --   HGB 9.5* 8.8* 10.3* 9.4* 10.8*  HCT 30.2* 27.9* 32.1* 30.1* 34.0*  MCV 92.4 92.4 92.2 94.4 91.9  PLT 221 211 272 215 209   Blood Culture    Component Value Date/Time   SDES URINE, RANDOM 11/06/2019  1107   SPECREQUEST  11/06/2019 1107    NONE Performed at [facility], [place] [address], [place], [place] [address]    CULT 80,000 COLONIES/mL YEAST (A) 11/06/2019 1107   REPTSTATUS 11/07/2019 FINAL 11/06/2019 1107    CBG: Recent Labs  Lab 11/10/19 1713 11/10/19 2207 11/11/19 0618 11/11/19 0938 11/11/19 1130  GLUCAP 117* 162* 189* 179* 250*    Lab Results  Component Value Date   INR 2.9 (H) 11/11/2019   INR 2.4 (H) 11/10/2019   INR 2.4 (H) 11/09/2019   Studies/Results: No results found.  Medications: . sodium chloride    . sodium chloride    . sodium chloride    . sodium chloride    . ceFEPime (MAXIPIME) IV Stopped (11/10/19 2050)   . acetaminophen  500 mg Oral TID  . calcium carbonate  1 tablet Oral 2 times per day  . carvedilol  12.5 mg Oral BID  . Chlorhexidine Gluconate Cloth  6 each Topical Q0600  .  darbepoetin (ARANESP) injection - DIALYSIS  60 mcg Intravenous Q Mon-HD  . doxercalciferol  1.5 mcg Intravenous Q M,W,F-HD  . feeding supplement (ENSURE ENLIVE)  237 mL Oral BID BM  . insulin aspart  0-9 Units Subcutaneous TID WC  . isosorbide mononitrate  30 mg Oral QPC lunch  . lactulose  20 g Oral TID  . Melatonin  3 mg Oral QHS  . multivitamin  1 tablet Oral QPC lunch  . Warfarin - Pharmacist Dosing Inpatient   Does not apply q1800    Dialysis Orders: [facility] MWF3.5 hours TDC - mult failed accesses Profile 1 - pressure drops very easily;max is pull 1.1 an hour; do not pull rinseback revaclear 300 dialyzer 400/600 2 K 2.5 Ca EDW 62.5  Epo 1200 U q HD; iron 50 mg/weekly 1.5 mcg hectoral each tx  Assessment/Plan: 1. Recurrent Pseudomonas bacteremia (Likely HD CRB) with evidence of mitral valve endocarditisthat is post [facility] exchange for infection focus control. Seen by CTA w/no imminent plan for surgical intervention-this will be reevaluated as an outpatient following completion of antibiotic therapy with cefepime; now off cipro as of 12/14 2.  ESRD: MWF -Next HD on 12/21. K 3.3, will use ^K bath.  3. Anemia: Hgb 10.8, continue ESA -- no Fe for the short term due to currently being treatment for infection. 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  5. 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 Ongoing inpatient rehabilitation for management of neurological deficits. Repeat MRI showed no acute changes 6. Hypertension/volume : BP well controlled. Keep SBP <180 per neuro. - net UF 2 L Friday and 1 L Monday and 1.5L Wed. CXR 12/14 - showed some pul vasc congestionstarting with goal 3 L, continue to titrate down as tolerated.  Post weight after HD yesterday 57.8kg 7.Nutrition -diet liberalized to regular - has supplements/multivits ordered - alb very low 8. MS change since 12/13 - no obvious findings on work up. Per notes may have been cefepime related - seem back to baseline today. Neurontin 100 mg per day stopped 12/14 9.Yeast UTI >80 K-diflucan started 12/16  Jen Mow, PA-C [facility] Pager: [phone] 11/11/2019,2:51 PM  LOS: 10 days

## 2019-11-11 NOTE — Progress Notes (Signed)
Patient vomited x3 this AM. Vomitus had appearance of undigested food. Patient given ginger ale to sip on versus po Zofran. Patient's son reports that patient has vertigo and she vomits periodically and that this is nothing new. Patient is resting and vomiting appears to have stopped at this time.

## 2019-11-11 NOTE — Progress Notes (Signed)
ANTICOAGULATION CONSULT NOTE - Follow-Up  Pharmacy Consult for Warfarin Indication: RIJ DVT (10/21/19), CVA (10/20/19)  No Known Allergies  Patient Measurements: Height: 5\' 7"  (170.2 cm) Weight: 136 lb 0.4 oz (61.7 kg) IBW/kg (Calculated) : 61.6  Vital Signs: Temp: 99.7 F (37.6 C) (12/19 0522) BP: 114/77 (12/19 0522) Pulse Rate: 108 (12/19 0522)  Labs: Recent Labs    11/08/19 1254 11/09/19 0731 11/10/19 0639 11/10/19 1405 11/10/19 1759 11/11/19 0608  HGB  --  10.3*  --  9.4* 10.8*  --   HCT  --  32.1*  --  30.1* 34.0*  --   PLT  --  272  --  215 209  --   LABPROT  --  25.9* 26.3*  --   --  30.6*  INR  --  2.4* 2.4*  --   --  2.9*  CREATININE 7.32*  --   --  7.14* 3.17*  --     Estimated Creatinine Clearance: 16.3 mL/min (A) (by C-G formula based on SCr of 3.17 mg/dL (H)).  Assessment: 68 yr old female with ESRD who presented on 10/20/19 with new CVA and RIJ DVT 10/21/19. Pharmacy consulted to dose Warfarin.   Warfarin started 12/4. Ciprofloxacin stopped 12/14, expect drug interaction with Warfarin to have resolved, will increase dose today. Fluconazole x1 given 12/16, also increases warfarin sensitivity.  INR today is therapeutic at 2.9. no CBC today, but prior H/H & plt stable . No bleeding noted. Noted to not be eating her meals x 2 days  Goal of Therapy:  INR 2-3 Monitor platelets by anticoagulation protocol: Yes   Plan:  - HOLD warfarin x 1 day given INR at high end of goal range - Daily PT/INR, CBC Q Mon - Will continue to monitor for any signs/symptoms of bleeding and will follow up with PT/INR in the a.m.   Thank you,   Eddie Candle, PharmD PGY-1 Pharmacy Resident   Please check amion for clinical pharmacist contact number

## 2019-11-12 ENCOUNTER — Inpatient Hospital Stay (HOSPITAL_COMMUNITY): Payer: Medicare PPO | Admitting: Speech Pathology

## 2019-11-12 ENCOUNTER — Inpatient Hospital Stay (HOSPITAL_COMMUNITY): Payer: Medicare PPO

## 2019-11-12 DIAGNOSIS — K219 Gastro-esophageal reflux disease without esophagitis: Secondary | ICD-10-CM

## 2019-11-12 DIAGNOSIS — I953 Hypotension of hemodialysis: Secondary | ICD-10-CM

## 2019-11-12 DIAGNOSIS — R791 Abnormal coagulation profile: Secondary | ICD-10-CM

## 2019-11-12 LAB — PROTIME-INR
INR: 4.8 (ref 0.8–1.2)
Prothrombin Time: 45.3 seconds — ABNORMAL HIGH (ref 11.4–15.2)

## 2019-11-12 LAB — GLUCOSE, CAPILLARY
Glucose-Capillary: 179 mg/dL — ABNORMAL HIGH (ref 70–99)
Glucose-Capillary: 139 mg/dL — ABNORMAL HIGH (ref 70–99)
Glucose-Capillary: 148 mg/dL — ABNORMAL HIGH (ref 70–99)

## 2019-11-12 MED ORDER — SODIUM CHLORIDE 0.9 % IV SOLN: INTRAVENOUS | Status: AC

## 2019-11-12 MED ORDER — PANTOPRAZOLE SODIUM 40 MG PO TBEC
40.0000 mg | DELAYED_RELEASE_TABLET | Freq: Every day | ORAL | Status: DC
  Administered 2019-11-12 – 2019-12-05 (×23): 40 mg via ORAL
  Filled 2019-11-12 (×24): qty 1

## 2019-11-12 MED ORDER — CHLORHEXIDINE GLUCONATE CLOTH 2 % EX PADS
6.0000 | MEDICATED_PAD | Freq: Every day | CUTANEOUS | Status: DC
  Administered 2019-11-14: 6 via TOPICAL

## 2019-11-12 NOTE — Progress Notes (Signed)
ANTICOAGULATION CONSULT NOTE - Follow-Up  Pharmacy Consult for Warfarin Indication: RIJ DVT (10/21/19), CVA (10/20/19)  No Known Allergies  Patient Measurements: Height: 5\' 7"  (170.2 cm) Weight: 136 lb 0.4 oz (61.7 kg) IBW/kg (Calculated) : 61.6  Vital Signs: Temp: 98.2 F (36.8 C) (12/20 1111) Temp Source: Oral (12/20 1111) BP: 92/50 (12/20 1111) Pulse Rate: 84 (12/20 1111)  Labs: Recent Labs    11/10/19 0639 11/10/19 1405 11/10/19 1759 11/11/19 0608 11/12/19 0822  HGB  --  9.4* 10.8*  --   --   HCT  --  30.1* 34.0*  --   --   PLT  --  215 209  --   --   LABPROT 26.3*  --   --  30.6* 45.3*  INR 2.4*  --   --  2.9* 4.8*  CREATININE  --  7.14* 3.17*  --   --     Estimated Creatinine Clearance: 16.3 mL/min (A) (by C-G formula based on SCr of 3.17 mg/dL (H)).  Assessment: 68 yr old female with ESRD who presented on 10/20/19 with new CVA and RIJ DVT 10/21/19. Pharmacy consulted to dose Warfarin.   INR remains supratherapeutic today at 4.8. Held warfarin 12/19, last H&H 12/18 stable. No issues with bleeding reported.  Goal of Therapy:  INR 2-3 Monitor platelets by anticoagulation protocol: Yes   Plan:  - HOLD warfarin x 1 day given supratherapeutic INR - Daily PT/INR, CBC Q Mon - Will continue to monitor for any signs/symptoms of bleeding and will follow up with PT/INR in the a.m.    Thank you for allowing pharmacy to participate in this patient's care.  Isaid Salvia L. Devin Going, [place] PGY1 Pharmacy Resident [phone] 11/12/19      1:07 PM  Please check AMION for all [place] phone numbers After 10:00 PM, call the [place] [phone]

## 2019-11-12 NOTE — Progress Notes (Signed)
Pharmacy Antibiotic Note  [name] is a 68 y.o. female admitted on 11/01/2019 with Pseudomonas bacteremia and MV IE.  Pharmacy has been consulted for Cefepime dosing.  The patient is ESRD-MWF. Noted ID plans for dual Cefepime + Cipro thru 12/06/19. Cipro DC'd on 12/15 given witnessed seizure. Fluconazole one time dose given for yeast UTI.  Will continue Cefepime daily dosing with in CIR and plan to switch to dosing with HD upon discharge.   Plan: - Continue Cefepime 1g IV every 24 hours after HD until 12/06/2019 - Switch cefepime to 2g after HD Mon/Wed, 3g after HD Fridays at discharge - Continue to monitor clinical status  Height: 5\' 7"  (170.2 cm) Weight: 136 lb 0.4 oz (61.7 kg) IBW/kg (Calculated) : 61.6  Temp (24hrs), Avg:98.6 F (37 C), Min:98.2 F (36.8 C), Max:99 F (37.2 C)  Recent Labs  Lab 11/06/19 1511 11/07/19 0606 11/07/19 0933 11/08/19 0510 11/08/19 1254 11/09/19 0731 11/10/19 1405 11/10/19 1759  WBC  --  9.1  --  7.8  --  9.2 10.0 12.2*  CREATININE 8.97*  --  5.23*  --  7.32*  --  7.14* 3.17*    Estimated Creatinine Clearance: 16.3 mL/min (A) (by C-G formula based on SCr of 3.17 mg/dL (H)).    No Known Allergies  Antimicrobials this admission: Cefepime 11/30 >> (12/06/19) Flluconazole 12/16  Cipro 12/3 >> 12/14  Microbiology results: 11/27 COVID >> neg 11/29 BCx >> pseudomonas 4/8 (pan sens) 12/1 Bcx>>neg 12/6 BCx: neg 12/14 UCx: yeast     Thank you for allowing pharmacy to participate in this patient's care.  Clemente Dewey L. Devin Going, [place] PGY1 Pharmacy Resident [phone] 11/12/19      1:10 PM  Please check AMION for all [place] phone numbers After 10:00 PM, call the [place] [phone]

## 2019-11-12 NOTE — Progress Notes (Signed)
At 2100, Spoke with Dr. Posey Pronto R/T patient refusing to allow staff to check CBG, vitals, scan armband, refusing PO meds, neuro checks, etc. She wants to speak with "Roselyn Reef the nurse", showed her my name badge, still didn't believe it was [place]. Spoke with son at shift change, reports patient not eating or drinking much. Patient denied feeling nauseated at that time. Son also reports, patient more confused today. Able to start 0.9% NS at 50cc/hr and give scheduled IV antibiotic. Refused all PO meds. At 2210, updated Dr. Posey Pronto on continued refusals and about patient Yelling  and swatting  at staff when attempting to assess or provide any care. Rests comfortably when left alone. Will continue to monitor and attempt to obtain CBG and vitals. Patrici Ranks A

## 2019-11-12 NOTE — Significant Event (Signed)
CRITICAL VALUE ALERT  Critical Value:  INR 4.8   Date & Time Notied: 11/12/2019 1013  Provider Notified: Delice Lesch  Orders Received/Actions taken: Place patient on bedrest and notify nephrology.

## 2019-11-12 NOTE — Progress Notes (Signed)
[facility] Progress Note   Subjective:   Patient seen and examined at bedside.  Episode of n/v this AM.  INR elevated to 4.8 so on bedrest today.  Confused today, keeps repeating she was given insulin 2x today.  Denies SOB, CP, edema.   Objective Vitals:   11/12/19 0307 11/12/19 0656 11/12/19 1111 11/12/19 1451  BP: (!) 88/63 (!) 84/54 (!) 92/50 (!) 95/54  Pulse: 96 89 84 88  Resp: 16 17 19 19   Temp: 98.6 F (37 C) 98.5 F (36.9 C) 98.2 F (36.8 C) 98.4 F (36.9 C)  TempSrc: Oral  Oral Oral  SpO2: 99% 100% 100% 100%  Weight:      Height:       Physical Exam General:NAD, WDWN female, laying in bed Heart:RRR Lungs:CTAB Abdomen:soft, mild epigastric tenderness Extremities:no LE edema Dialysis Access: R IJ [facility]   Filed Weights   11/10/19 1313 11/10/19 1555 11/11/19 0522  Weight: 58.5 kg 57.8 kg 61.7 kg    Intake/Output Summary (Last 24 hours) at 11/12/2019 1506 Last data filed at 11/12/2019 1009 Gross per 24 hour  Intake 633.26 ml  Output 1070 ml  Net -436.74 ml    Additional Objective Labs: Basic Metabolic Panel: Recent Labs  Lab 11/08/19 1254 11/10/19 1405 11/10/19 1759  NA 128* 136 134*  K 4.7 3.9 3.3*  CL 90* 95* 93*  CO2 24 26 27   GLUCOSE 200* 168* 173*  BUN 38* 33* 9  CREATININE 7.32* 7.14* 3.17*  CALCIUM 8.8* 8.7* 8.5*  PHOS 4.7* 3.5 2.2*   Liver Function Tests: Recent Labs  Lab 11/06/19 1511 11/08/19 1254 11/10/19 1405 11/10/19 1759  AST 18  --   --   --   ALT 12  --   --   --   ALKPHOS 86  --   --   --   BILITOT 0.2*  --   --   --   PROT 7.5  --   --   --   ALBUMIN 2.1* 2.2* 2.2* 2.4*   CBC: Recent Labs  Lab 11/07/19 0606 11/08/19 0510 11/09/19 0731 11/10/19 1405 11/10/19 1759  WBC 9.1 7.8 9.2 10.0 12.2*  NEUTROABS  --   --  6.7  --   --   HGB 9.5* 8.8* 10.3* 9.4* 10.8*  HCT 30.2* 27.9* 32.1* 30.1* 34.0*  MCV 92.4 92.4 92.2 94.4 91.9  PLT 221 211 272 215 209   Blood Culture    Component Value Date/Time   SDES URINE, RANDOM 11/06/2019 1107   SPECREQUEST  11/06/2019 1107    NONE Performed at [facility], [place] [address] [place], [place] [address]    CULT 80,000 COLONIES/mL YEAST (A) 11/06/2019 1107   REPTSTATUS 11/07/2019 FINAL 11/06/2019 1107   CBG: Recent Labs  Lab 11/11/19 1130 11/11/19 1614 11/11/19 2112 11/12/19 0611 11/12/19 1126  GLUCAP 250* 182* 175* 179* 139*    Lab Results  Component Value Date   INR 4.8 (HH) 11/12/2019   INR 2.9 (H) 11/11/2019   INR 2.4 (H) 11/10/2019   Studies/Results: No results found.  Medications: . sodium chloride    . sodium chloride    . ceFEPime (MAXIPIME) IV Stopped (11/11/19 2046)   . acetaminophen  500 mg Oral TID  . calcium carbonate  1 tablet Oral 2 times per day  . carvedilol  12.5 mg Oral BID  . Chlorhexidine Gluconate Cloth  6 each Topical Q0600  . darbepoetin (ARANESP) injection - DIALYSIS  60 mcg  Intravenous Q Mon-HD  . doxercalciferol  1.5 mcg Intravenous Q M,W,F-HD  . feeding supplement (ENSURE ENLIVE)  237 mL Oral BID BM  . insulin aspart  0-9 Units Subcutaneous TID WC  . isosorbide mononitrate  30 mg Oral QPC lunch  . lactulose  20 g Oral TID  . Melatonin  3 mg Oral QHS  . multivitamin  1 tablet Oral QPC lunch  . pantoprazole  40 mg Oral Daily  . Warfarin - Pharmacist Dosing Inpatient   Does not apply q1800    Dialysis Orders: [facility] MWF3.5 hours TDC - mult failed accesses Profile 1 - pressure drops very easily;max is pull 1.1 an hour; do not pull rinseback revaclear 300 dialyzer 400/600 2 K 2.5 Ca EDW 62.5  Epo 1200 U q HD; iron 50 mg/weekly 1.5 mcg hectoral each tx  Assessment/Plan: 1. Recurrent Pseudomonas bacteremia (Likely HD CRB) with evidence of mitral valve endocarditisthat is post [facility] exchange for infection focus control. Seen by CTA w/no imminent plan for surgical intervention-this will be reevaluated as an outpatient following completion of antibiotic therapy with cefepime; now off  cipro as of 12/15 d/t witnessed seizure.  2. ESRD: MWF -HD tomorrow. K 3.3, will use ^K bath.  3. Anemia:Hgb 10.8, continue ESA-- no Fe for the short term due to currently being treatment for infection. 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  5. 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 Ongoing inpatient rehabilitation for management of neurological deficits. Repeat MRI showed no acute changes. INR 4.8 today.  6. Hypertension/volume : BP well controlled. Keep SBP <180 per neuro. - net UF 2 L Friday and 1 L Monday and 1.5L Wed. CXR 12/14 - showed some pul vasc congestionstarting with goal 3 L, continue to titrate down as tolerated.  Post weight after HD yesterday 57.8kg 7.Nutrition -diet liberalized to regular - has supplements/multivits ordered - alb very low 8. MS change since 12/13 - no obvious findings on work up. Per notes may have been cefepime related - seem back to baseline today. Neurontin 100 mg per day stopped 12/14 9.Yeast UTI >80 K-diflucan started 12/16  Jen Mow, PA-C [facility] Pager: [phone] 11/12/2019,3:06 PM  LOS: 11 days

## 2019-11-12 NOTE — Progress Notes (Signed)
Physical Therapy Session Note  Patient Details  Name: [name] MRN: [id number] Date of Birth: 25-Nov-1950  Today's Date: 11/12/2019 PT Individual Time: 1000-1025 PT Individual Time Calculation (min): 25 min  and Today's Date: 11/12/2019 PT Missed Time: 35 Minutes Missed Time Reason: MD hold (Comment)(High INR)  Short Term Goals: Week 2:  PT Short Term Goal 1 (Week 2): Pt will transfer supine<>sit and sit<>supine with min A PT Short Term Goal 2 (Week 2): Pt will transfer stand<>pivot with LRAD mod A PT Short Term Goal 3 (Week 2): Pt will perform car transfer with LRAD mod A  Skilled Therapeutic Interventions/Progress Updates:    Pt supine in bed upon PT arrival, agreeable to therapy tx and denies pain, does report nausea and fatigue that started yesterday. Pt performed rolling in both directions with mod assist and use of bedrails to don pants. Pt transferred to sitting EOB with max assist today, poor sitting balance requiring up to mod-max assist to remain sitting upright EOB due to posterior and L lateral lean. Pt's son present and reports that she has not been doing well, she is usually able to sit up on her own and started ambulating the other day. Pt transferred to w/c this session with stedy, mod assist for sit<>stands within the stedy, min-mod assist for sitting balance on stedy seat secondary to L lateral lean. Pt transferred dependently with stedy to w/c. Once sitting in w/c pt requesting cool wash cloth for her face, therapist provided. RN Marzetta Board) came to the room to report she just got off the phone with the MD and will be holding further therapies secondary to high INR value. Therapist transferred pt back to bed with stedy +2 assist, sit>supine max assist. Pt left supine in bed with needs in reach and bed alarm set. Pt missed 35 minutes of skilled therapy tx this session secondary to medical hold.   Therapy Documentation Precautions:  Precautions Precautions: Fall Precaution  Comments: Strong L lateral lean, TLSO for mobility Required Braces or Orthoses: Other Brace(TLSO) Spinal Brace: Thoracolumbosacral orthotic Spinal Brace Comments: for comfort with mobility Restrictions Weight Bearing Restrictions: No    Therapy/Group: Individual Therapy  Netta Corrigan, PT, DPT, CSRS 11/12/2019, 10:26 AM

## 2019-11-12 NOTE — Progress Notes (Signed)
Speech Language Pathology Daily Session Note  Patient Details  Name: [name] MRN: [id number] Date of Birth: 02-25-1951  Today's Date: 11/12/2019 SLP Individual Time: 1430-1445 SLP Individual Time Calculation (min): 15 min  Short Term Goals: Week 2: SLP Short Term Goal 1 (Week 2): Pt will demonstrate functional problem solving during basic tasks with Min A verbal/visual cues SLP Short Term Goal 2 (Week 2): Pt will demonstrate recall of day to day/new information with Min A verbal cues for use of compensatory memory strategies SLP Short Term Goal 3 (Week 2): Pt will selectively attend to functional tasks with Supervision A verbal cues for redirection SLP Short Term Goal 4 (Week 2): Pt will demonstrate anticipatory awareness by stating 2 ADLs she will need assistance with upon her return home with Min A verbal/visual cues  Skilled Therapeutic Interventions: Pt was seen for skilled ST targeting cognitive goals. Pt's son was present throughout session and reported more fluctuations in speech/language and confusion today in comparison to earlier in week. Pt oriented to place and situation independently, however required Max A verbal cues for attention and maintaining topic during conversation. She also required Max A verbal cues to open eyes to maintain arousal and attend to tasks. Pt sorted pegs by color (field of 2) with Mod A verbal cues for initiation and Min A verbal cues for problem solving. Pt with increasing fatigue and unable to participate further, resulting in 15 missed minutes of skilled ST. Pt left laying in bed with alarm set and all needs within reach, son still present. Continue per current plan of care.       Pain Pain Assessment Pain Scale: Faces Faces Pain Scale: No hurt  Therapy/Group: Individual Therapy  Arbutus Leas 11/12/2019, 2:57 PM

## 2019-11-12 NOTE — Progress Notes (Signed)
At [place], patient continues to complain of N&V. 100cc's of emesis in bag. Small amount of partially digested food, mostly liquid. Son reports, this amount of nausea is new. Poor PO intake and few bites at lunch.  BP 89/51 with HR 102. Paged Dr. Posey Pronto R/t above info. Orders for lactulose enema and zofran 2mg  ODT. PRN zofran 2mg 's given at 2031 with "some" relief, MR dose given at 2119, effective. Requested and given sprite, drank 120cc's and butter pecan glucerna, drank 120cc's. At 2201, Lactulose enema given, 1000cc's input with 950cc's of output. small amount of mushy stool, otherwise liquid output. Patient has slept good, rest of night. [name] A

## 2019-11-12 NOTE — Progress Notes (Addendum)
[place] PHYSICAL MEDICINE & REHABILITATION PROGRESS NOTE   Subjective/Complaints: Patient seen sitting up in bed this morning.  Overnight patient had some nausea, which resolved with medications.  Educated patient again on lactulose.  Later informed by nursing regarding critical value INR.  ROS: Denies CP, SOB, N/V/D  Objective:   No results found. Recent Labs    11/10/19 1405 11/10/19 1759  WBC 10.0 12.2*  HGB 9.4* 10.8*  HCT 30.1* 34.0*  PLT 215 209   Recent Labs    11/10/19 1405 11/10/19 1759  NA 136 134*  K 3.9 3.3*  CL 95* 93*  CO2 26 27  GLUCOSE 168* 173*  BUN 33* 9  CREATININE 7.14* 3.17*  CALCIUM 8.7* 8.5*    Intake/Output Summary (Last 24 hours) at 11/12/2019 1157 Last data filed at 11/12/2019 1009 Gross per 24 hour  Intake 643.26 ml  Output 1070 ml  Net -426.74 ml     Physical Exam: Vital Signs Blood pressure (!) 92/50, pulse 84, temperature 98.2 F (36.8 C), temperature source Oral, resp. rate 19, height 5\' 7"  (1.702 m), weight 61.7 kg, SpO2 100 %. Constitutional: No distress . Vital signs reviewed. HENT: Normocephalic.  Atraumatic. Eyes: EOMI. No discharge. Cardiovascular: No JVD. Respiratory: Normal effort.  No stridor. GI: Non-distended. Skin: Warm and dry.  Intact. Psych: Flat.  Confused. Musc: No edema in extremities.  No tenderness in extremities. Neurological: Alert and x3 Motor: RUE: 5/5 proximal to distal RLE: HF, KE 3+/5, ADF 4/5, stable LUE: 4+/5 proximal to distal LLE: HF, KE 3/5, ADF 3+/5, stable  Assessment/Plan: 1. Functional deficits secondary to right basal ganglia stroke which require 3+ hours per day of interdisciplinary therapy in a comprehensive inpatient rehab setting.  Physiatrist is providing close team supervision and 24 hour management of active medical problems listed below.  Physiatrist and rehab team continue to assess barriers to discharge/monitor patient progress toward functional and medical goals  Care  Tool:  Bathing    Body parts bathed by patient: Right arm, Left arm, Chest, Abdomen, Front perineal area, Right upper leg, Left upper leg, Face   Body parts bathed by helper: Buttocks, Right lower leg, Left lower leg     Bathing assist Assist Level: Minimal Assistance - Patient > 75%     Upper Body Dressing/Undressing Upper body dressing   What is the patient wearing?: Pull over shirt    Upper body assist Assist Level: Minimal Assistance - Patient > 75%    Lower Body Dressing/Undressing Lower body dressing      What is the patient wearing?: Pants, Incontinence brief     Lower body assist Assist for lower body dressing: Maximal Assistance - Patient 25 - 49%     Toileting Toileting    Toileting assist Assist for toileting: Maximal Assistance - Patient 25 - 49%     Transfers Chair/bed transfer  Transfers assist     Chair/bed transfer assist level: Dependent - mechanical lift(stedy)     Locomotion Ambulation   Ambulation assist   Ambulation activity did not occur: Safety/medical concerns(back pain, bilateral LE weakness, poor bilateral knee control)  Assist level: 2 helpers Assistive device: Walker-rolling Max distance: 71ft   Walk 10 feet activity   Assist  Walk 10 feet activity did not occur: Safety/medical concerns(back pain, bilateral LE weakness, poor bilateral knee control)  Assist level: 2 helpers Assistive device: Walker-rolling   Walk 50 feet activity   Assist Walk 50 feet with 2 turns activity did not occur: Safety/medical concerns(back  pain, bilateral LE weakness, poor bilateral knee control)  Assist level: 2 helpers Assistive device: Walker-rolling    Walk 150 feet activity   Assist Walk 150 feet activity did not occur: Safety/medical concerns(back pain, bilateral LE weakness, poor bilateral knee control)         Walk 10 feet on uneven surface  activity   Assist Walk 10 feet on uneven surfaces activity did not occur:  Safety/medical concerns(back pain, bilateral LE weakness, poor bilateral knee control)         Wheelchair     Assist Will patient use wheelchair at discharge?: Yes Type of Wheelchair: Manual Wheelchair activity did not occur: Safety/medical concerns(Pt could not tolerate sitting in WC for long enough to complete activity due to low back pain)  Wheelchair assist level: Minimal Assistance - Patient > 75% Max wheelchair distance: 10ft    Wheelchair 50 feet with 2 turns activity    Assist    Wheelchair 50 feet with 2 turns activity did not occur: Safety/medical concerns(Pt could not tolerate sitting in WC for long enough to complete activity due to low back pain)       Wheelchair 150 feet activity     Assist  Wheelchair 150 feet activity did not occur: Safety/medical concerns(Pt could not tolerate sitting in WC for long enough to complete activity due to low back pain)       Blood pressure (!) 92/50, pulse 84, temperature 98.2 F (36.8 C), temperature source Oral, resp. rate 19, height 5\' 7"  (1.702 m), weight 61.7 kg, SpO2 100 %.    Medical Problem List and Plan: 1.  Impaired gait with left side weakness secondary to right basal ganglia infarction as well as small cortical infarct left frontal lobe  Bedrest today given INR  CT head on 12/13 personally reviewed, showing some improvement, MRI personally reviewed, stable  Chest x-ray personally reviewed, unremarkable for infection  Discussed with neurology results of the EEG -findings possibly related to uremia +/- cefepime  Discussed with psychiatry, continue medical management 2.  Antithrombotics: -DVT/anticoagulation: Chronic Coumadin for right IJ acute DVT.  Plan anticoagulation x3 months  INR critical value of 4.8 on 12/20, discussed with pharmacy, hold coumadin today.             -antiplatelet therapy: N/A 3. Pain Management chronic back and left hip pain: Neurontin 100 mg nightly, oxycodone as needed.  TLSO  back brace for comfort 4. Mood: Provide emotional support             -antipsychotic agents: N/A 5. Neuropsych: This patient is not capable of making decisions on her own behalf. 6. Skin/Wound Care: Routine skin checks 7. Fluids/Electrolytes/Nutrition: Routine in and outs 8.  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  New HD tunneled catheter placed 10/26/2019. 9.  ID/recurrent Pseudomonas bacteremia.    Continue Maxipime as directed with infectious disease to discuss duration of antibiotic therapies anticipate 6 weeks.  Cipro DC'd by ID due to seizure risk 10.  Hemodialysis MWF.  Follow-up per renal services.  11.  Diabetes mellitus.  Hemoglobin A1c 7.6.  SSI.  Labile on 12/20, monitor for trend 12.  Hypertension.  Imdur 30 mg daily  Coreg 6.25 mg twice daily, increased to 12.5 on 12/15, parameters placed.    Monitor with increased mobility.  Soft on 12/20, recs per nephro 13.  Acute on chronic anemia.    Hemoglobin 10.8 on 12/18  Continue to monitor 14. Constipation:  See #  16  Improving 15.  Candidiasis  Diflucan ordered on 12/16  Repeat Ucx remains pending on 12/20 16.  Hyperammonemia with hepatic encephalopathy  Lactulose started on 12/17, encourage compliance 17. Sleep disturance  Melatonin added on 12/17  Sleep chart ordered 18.  Leukocytosis  WBCs 12.2 on 12/18, labs with HD  Afebrile  Continue to monitor 19.  GERD  Home Protonix ordered on 12/20   LOS: 11 days A FACE TO FACE EVALUATION WAS PERFORMED  Ernan Runkles Lorie Phenix 11/12/2019, 11:57 AM

## 2019-11-12 NOTE — Progress Notes (Signed)
Brief Note:  Please see note from earlier today as well.  Informed by nursing regarding confusion the evening.  Discussed with Nephro started patient on 50 mL normal saline for 1 L.  Discussed with ID on call, Dr. Tommy Medal to evaluate tomorrow. Ucx remains pending. Q2 Neurochecks ordered.

## 2019-11-13 ENCOUNTER — Inpatient Hospital Stay (HOSPITAL_COMMUNITY): Payer: Medicare PPO

## 2019-11-13 ENCOUNTER — Inpatient Hospital Stay (HOSPITAL_COMMUNITY): Payer: Medicare PPO | Admitting: Speech Pathology

## 2019-11-13 ENCOUNTER — Inpatient Hospital Stay (HOSPITAL_COMMUNITY): Payer: Medicare PPO | Admitting: Occupational Therapy

## 2019-11-13 DIAGNOSIS — R410 Disorientation, unspecified: Secondary | ICD-10-CM

## 2019-11-13 LAB — CBC
HCT: 28.4 % — ABNORMAL LOW (ref 36.0–46.0)
Hemoglobin: 9.1 g/dL — ABNORMAL LOW (ref 12.0–15.0)
MCH: 29.2 pg (ref 26.0–34.0)
MCHC: 32 g/dL (ref 30.0–36.0)
MCV: 91 fL (ref 80.0–100.0)
Platelets: 199 10*3/uL (ref 150–400)
RBC: 3.12 MIL/uL — ABNORMAL LOW (ref 3.87–5.11)
RDW: 17.3 % — ABNORMAL HIGH (ref 11.5–15.5)
WBC: 12.6 10*3/uL — ABNORMAL HIGH (ref 4.0–10.5)
nRBC: 0 % (ref 0.0–0.2)

## 2019-11-13 LAB — PROTIME-INR
INR: 5.3 (ref 0.8–1.2)
Prothrombin Time: 48.5 seconds — ABNORMAL HIGH (ref 11.4–15.2)

## 2019-11-13 LAB — RENAL FUNCTION PANEL
Albumin: 1.7 g/dL — ABNORMAL LOW (ref 3.5–5.0)
Anion gap: 15 (ref 5–15)
BUN: 57 mg/dL — ABNORMAL HIGH (ref 8–23)
CO2: 22 mmol/L (ref 22–32)
Calcium: 8.3 mg/dL — ABNORMAL LOW (ref 8.9–10.3)
Chloride: 94 mmol/L — ABNORMAL LOW (ref 98–111)
Creatinine, Ser: 8.41 mg/dL — ABNORMAL HIGH (ref 0.44–1.00)
GFR calc Af Amer: 5 mL/min — ABNORMAL LOW (ref >60)
GFR calc non Af Amer: 4 mL/min — ABNORMAL LOW (ref >60)
Glucose, Bld: 198 mg/dL — ABNORMAL HIGH (ref 70–99)
Phosphorus: 5.2 mg/dL — ABNORMAL HIGH (ref 2.5–4.6)
Potassium: 4.4 mmol/L (ref 3.5–5.1)
Sodium: 131 mmol/L — ABNORMAL LOW (ref 135–145)

## 2019-11-13 LAB — GLUCOSE, CAPILLARY
Glucose-Capillary: 159 mg/dL — ABNORMAL HIGH (ref 70–99)
Glucose-Capillary: 122 mg/dL — ABNORMAL HIGH (ref 70–99)
Glucose-Capillary: 198 mg/dL — ABNORMAL HIGH (ref 70–99)

## 2019-11-13 MED ORDER — DOXERCALCIFEROL 4 MCG/2ML IV SOLN
INTRAVENOUS | Status: AC
  Filled 2019-11-13: qty 2

## 2019-11-13 MED ORDER — SODIUM CHLORIDE 0.9 % IV SOLN: 100.0000 mL | INTRAVENOUS | Status: DC | PRN

## 2019-11-13 MED ORDER — ALBUMIN HUMAN 25 % IV SOLN
INTRAVENOUS | Status: AC
  Administered 2019-11-13: 25 g via INTRAVENOUS
  Filled 2019-11-13: qty 100

## 2019-11-13 MED ORDER — ALTEPLASE 2 MG IJ SOLR: 2.0000 mg | Freq: Once | INTRAMUSCULAR | Status: DC | PRN

## 2019-11-13 MED ORDER — LIDOCAINE HCL (PF) 1 % IJ SOLN: 5.0000 mL | INTRAMUSCULAR | Status: DC | PRN

## 2019-11-13 MED ORDER — HEPARIN SODIUM (PORCINE) 1000 UNIT/ML DIALYSIS: 1000.0000 [IU] | INTRAMUSCULAR | Status: DC | PRN

## 2019-11-13 MED ORDER — HEPARIN SODIUM (PORCINE) 1000 UNIT/ML IJ SOLN
INTRAMUSCULAR | Status: AC
  Administered 2019-11-13: 16:00:00 3200 [IU] via INTRAVENOUS_CENTRAL
  Filled 2019-11-13: qty 4

## 2019-11-13 MED ORDER — DARBEPOETIN ALFA 60 MCG/0.3ML IJ SOSY
PREFILLED_SYRINGE | INTRAMUSCULAR | Status: AC
  Administered 2019-11-13: 60 ug via INTRAVENOUS
  Filled 2019-11-13: qty 0.3

## 2019-11-13 MED ORDER — LIDOCAINE-PRILOCAINE 2.5-2.5 % EX CREA: 1.0000 "application " | TOPICAL_CREAM | CUTANEOUS | Status: DC | PRN

## 2019-11-13 MED ORDER — PENTAFLUOROPROP-TETRAFLUOROETH EX AERO: 1.0000 "application " | INHALATION_SPRAY | CUTANEOUS | Status: DC | PRN

## 2019-11-13 MED ORDER — ALBUMIN HUMAN 25 % IV SOLN: 25.0000 g | Freq: Once | INTRAVENOUS | Status: AC

## 2019-11-13 NOTE — Progress Notes (Signed)
Subjective: Pt with confusion  Antibiotics:  Anti-infectives (From admission, onward)   Start     Dose/Rate Route Frequency Ordered Stop   11/08/19 1400  fluconazole (DIFLUCAN) tablet 100 mg  Status:  Discontinued     100 mg Oral Every 24 hours 11/08/19 1059 11/09/19 1101   11/01/19 2000  ceFEPIme (MAXIPIME) 1 g in sodium chloride 0.9 % 100 mL IVPB     1 g 200 mL/hr over 30 Minutes Intravenous Every 24 hours 11/01/19 1715     11/01/19 1800  ciprofloxacin (CIPRO) tablet 500 mg  Status:  Discontinued     500 mg Oral Daily-1800 11/01/19 1715 11/07/19 0914      Medications: Scheduled Meds: . heparin      . acetaminophen  500 mg Oral TID  . calcium carbonate  1 tablet Oral 2 times per day  . carvedilol  12.5 mg Oral BID  . Chlorhexidine Gluconate Cloth  6 each Topical Q0600  . darbepoetin (ARANESP) injection - DIALYSIS  60 mcg Intravenous Q Mon-HD  . doxercalciferol      . doxercalciferol  1.5 mcg Intravenous Q M,W,F-HD  . feeding supplement (ENSURE ENLIVE)  237 mL Oral BID BM  . insulin aspart  0-9 Units Subcutaneous TID WC  . isosorbide mononitrate  30 mg Oral QPC lunch  . lactulose  20 g Oral TID  . Melatonin  3 mg Oral QHS  . multivitamin  1 tablet Oral QPC lunch  . pantoprazole  40 mg Oral Daily  . Warfarin - Pharmacist Dosing Inpatient   Does not apply q1800   Continuous Infusions: . sodium chloride    . sodium chloride    . ceFEPime (MAXIPIME) IV Stopped (11/12/19 2106)   PRN Meds:.sodium chloride, sodium chloride, acetaminophen **OR** acetaminophen (TYLENOL) oral liquid 160 mg/5 mL **OR** acetaminophen, alteplase, fluticasone, heparin, lidocaine (PF), lidocaine-prilocaine, meclizine, ondansetron, oxyCODONE, pentafluoroprop-tetrafluoroeth, sorbitol    Objective: Weight change:   Intake/Output Summary (Last 24 hours) at 11/13/2019 1558 Last data filed at 11/13/2019 1300 Gross per 24 hour  Intake 695.11 ml  Output --  Net 695.11 ml   Blood pressure  113/70, pulse 96, temperature 98.8 F (37.1 C), temperature source Oral, resp. rate (!) 22, height 5\' 7"  (1.702 m), weight 60.4 kg, SpO2 97 %. Temp:  [98.8 F (37.1 C)] 98.8 F (37.1 C) (12/21 1240) Pulse Rate:  [84-96] 96 (12/21 1500) Resp:  [22] 22 (12/21 1240) BP: (96-119)/(60-72) 113/70 (12/21 1500) SpO2:  [97 %] 97 % (12/21 1240) Weight:  [60.4 kg] 60.4 kg (12/21 1240)  Physical Exam: General: Alert and awake, oriented person but not particularly verbal HEENT: anicteric sclera, EOMI CVS regular rate, normal no murmurs gallops or rubs heard, HD catheter in place Chest: , no wheezing, no respiratory distress Abdomen: soft non-distended,  Extremities: no edema or deformity noted bilaterally Skin: no rashes Neuro: Hemiplegia  CBC:    BMET Recent Labs    11/10/19 1759 11/13/19 1248  NA 134* 131*  K 3.3* 4.4  CL 93* 94*  CO2 27 22  GLUCOSE 173* 198*  BUN 9 57*  CREATININE 3.17* 8.41*  CALCIUM 8.5* 8.3*     Liver Panel  Recent Labs    11/10/19 1759 11/13/19 1248  ALBUMIN 2.4* 1.7*       Sedimentation Rate No results for input(s): ESRSEDRATE in the last 72 hours. C-Reactive Protein No results for input(s): CRP in the last 72 hours.  Micro Results: Recent Results (  from the past 720 hour(s))  SARS CORONAVIRUS 2 (TAT 6-24 HRS) Nasopharyngeal Nasopharyngeal Swab     Status: None   Collection Time: 10/20/19  7:30 AM   Specimen: Nasopharyngeal Swab  Result Value Ref Range Status   SARS Coronavirus 2 NEGATIVE NEGATIVE Final    Comment: (NOTE) SARS-CoV-2 target nucleic acids are NOT DETECTED. The SARS-CoV-2 RNA is generally detectable in upper and lower respiratory specimens during the acute phase of infection. Negative results do not preclude SARS-CoV-2 infection, do not rule out co-infections with other pathogens, and should not be used as the sole basis for treatment or other patient management decisions. Negative results must be combined with clinical  observations, patient history, and epidemiological information. The expected result is Negative. Fact Sheet for Patients: SugarRoll.be Fact Sheet for Healthcare Providers: https://www.woods-mathews.com/ This test is not yet approved or cleared by the [place] FDA and  has been authorized for detection and/or diagnosis of SARS-CoV-2 by FDA under an Emergency Use Authorization (EUA). This EUA will remain  in effect (meaning this test can be used) for the duration of the COVID-19 declaration under Section 56 4(b)(1) of the Act, 21 U.S.C. section 360bbb-3(b)(1), unless the authorization is terminated or revoked sooner. Performed at [facility], [place] [address] [place], [place] [address]   Culture, blood (routine x 2)     Status: Abnormal   Collection Time: 10/22/19  7:35 AM   Specimen: BLOOD  Result Value Ref Range Status   Specimen Description BLOOD RIGHT ANTECUBITAL  Final   Special Requests   Final    BOTTLES DRAWN AEROBIC ONLY Blood Culture adequate volume   Culture  Setup Time   Final    GRAM NEGATIVE RODS AEROBIC BOTTLE ONLY CRITICAL VALUE NOTED.  VALUE IS CONSISTENT WITH PREVIOUSLY REPORTED AND CALLED VALUE.    Culture (A)  Final    PSEUDOMONAS AERUGINOSA SUSCEPTIBILITIES PERFORMED ON PREVIOUS CULTURE WITHIN THE LAST 5 DAYS. Performed at [facility], [place] [address], [place], [place] [address]    Report Status 10/25/2019 FINAL  Final  Culture, blood (routine x 2)     Status: Abnormal   Collection Time: 10/22/19  7:43 AM   Specimen: BLOOD LEFT HAND  Result Value Ref Range Status   Specimen Description BLOOD LEFT HAND  Final   Special Requests   Final    BOTTLES DRAWN AEROBIC ONLY Blood Culture adequate volume   Culture  Setup Time   Final    GRAM NEGATIVE RODS AEROBIC BOTTLE ONLY CRITICAL RESULT CALLED TO, READ BACK BY AND VERIFIED WITH: PHARMD V BRYK [id number] AT 725 AM BY CM Performed at [facility],  [place] [address], [place], [place] [address]    Culture PSEUDOMONAS AERUGINOSA (A)  Final   Report Status 10/25/2019 FINAL  Final   Organism ID, Bacteria PSEUDOMONAS AERUGINOSA  Final      Susceptibility   Pseudomonas aeruginosa - MIC*    CEFTAZIDIME 2 SENSITIVE Sensitive     CIPROFLOXACIN <=0.25 SENSITIVE Sensitive     GENTAMICIN <=1 SENSITIVE Sensitive     IMIPENEM 1 SENSITIVE Sensitive     PIP/TAZO <=4 SENSITIVE Sensitive     CEFEPIME <=1 SENSITIVE Sensitive     * PSEUDOMONAS AERUGINOSA  Blood Culture ID Panel (Reflexed)     Status: Abnormal   Collection Time: 10/22/19  7:43 AM  Result Value Ref Range Status   Enterococcus species NOT DETECTED NOT DETECTED Final   Listeria monocytogenes NOT DETECTED NOT  DETECTED Final   Staphylococcus species NOT DETECTED NOT DETECTED Final   Staphylococcus aureus (BCID) NOT DETECTED NOT DETECTED Final   Streptococcus species NOT DETECTED NOT DETECTED Final   Streptococcus agalactiae NOT DETECTED NOT DETECTED Final   Streptococcus pneumoniae NOT DETECTED NOT DETECTED Final   Streptococcus pyogenes NOT DETECTED NOT DETECTED Final   Acinetobacter baumannii NOT DETECTED NOT DETECTED Final   Enterobacteriaceae species NOT DETECTED NOT DETECTED Final   Enterobacter cloacae complex NOT DETECTED NOT DETECTED Final   Escherichia coli NOT DETECTED NOT DETECTED Final   Klebsiella oxytoca NOT DETECTED NOT DETECTED Final   Klebsiella pneumoniae NOT DETECTED NOT DETECTED Final   Proteus species NOT DETECTED NOT DETECTED Final   Serratia marcescens NOT DETECTED NOT DETECTED Final   Carbapenem resistance NOT DETECTED NOT DETECTED Final   Haemophilus influenzae NOT DETECTED NOT DETECTED Final   Neisseria meningitidis NOT DETECTED NOT DETECTED Final   Pseudomonas aeruginosa DETECTED (A) NOT DETECTED Final    Comment: CRITICAL RESULT CALLED TO, READ BACK BY AND VERIFIED WITH: PHARMD V BRYK [id number] AT 725 AM BY CM    Candida albicans NOT DETECTED NOT DETECTED Final     Candida glabrata NOT DETECTED NOT DETECTED Final   Candida krusei NOT DETECTED NOT DETECTED Final   Candida parapsilosis NOT DETECTED NOT DETECTED Final   Candida tropicalis NOT DETECTED NOT DETECTED Final    Comment: Performed at [facility], [address] [place], [place] [address]  Culture, blood (routine x 2)     Status: Abnormal   Collection Time: 10/22/19  5:03 PM   Specimen: BLOOD LEFT HAND  Result Value Ref Range Status   Specimen Description BLOOD LEFT HAND  Final   Special Requests   Final    BOTTLES DRAWN AEROBIC AND ANAEROBIC Blood Culture adequate volume   Culture  Setup Time   Final    AEROBIC BOTTLE ONLY GRAM NEGATIVE RODS CRITICAL VALUE NOTED.  VALUE IS CONSISTENT WITH PREVIOUSLY REPORTED AND CALLED VALUE.    Culture (A)  Final    PSEUDOMONAS AERUGINOSA SUSCEPTIBILITIES PERFORMED ON PREVIOUS CULTURE WITHIN THE LAST 5 DAYS. Performed at [facility], [place] [address] [place], [place] [address]    Report Status 10/25/2019 FINAL  Final  Culture, blood (routine x 2)     Status: Abnormal   Collection Time: 10/22/19  5:08 PM   Specimen: BLOOD LEFT ARM  Result Value Ref Range Status   Specimen Description BLOOD LEFT ARM  Final   Special Requests   Final    BOTTLES DRAWN AEROBIC ONLY Blood Culture results may not be optimal due to an inadequate volume of blood received in culture bottles   Culture  Setup Time   Final    AEROBIC BOTTLE ONLY GRAM NEGATIVE RODS CRITICAL VALUE NOTED.  VALUE IS CONSISTENT WITH PREVIOUSLY REPORTED AND CALLED VALUE.    Culture (A)  Final    PSEUDOMONAS AERUGINOSA SUSCEPTIBILITIES PERFORMED ON PREVIOUS CULTURE WITHIN THE LAST 5 DAYS. Performed at [facility], [place] [address], [place], [place] [address]    Report Status 10/25/2019 FINAL  Final  Culture, blood (routine x 2)     Status: None   Collection Time: 10/24/19  1:18 PM   Specimen: BLOOD LEFT HAND  Result Value Ref Range Status   Specimen Description BLOOD LEFT  HAND  Final   Special Requests   Final    BOTTLES DRAWN AEROBIC ONLY Blood Culture adequate volume   Culture  Final    NO GROWTH 5 DAYS Performed at [facility], [place] [address] [place], [place] [address]    Report Status 10/29/2019 FINAL  Final  Culture, blood (routine x 2)     Status: None   Collection Time: 10/24/19  1:28 PM   Specimen: BLOOD RIGHT HAND  Result Value Ref Range Status   Specimen Description BLOOD RIGHT HAND  Final   Special Requests   Final    BOTTLES DRAWN AEROBIC AND ANAEROBIC Blood Culture results may not be optimal due to an excessive volume of blood received in culture bottles   Culture   Final    NO GROWTH 5 DAYS Performed at [facility], [place] [address], [place], [place] [address]    Report Status 10/29/2019 FINAL  Final  Culture, blood (routine x 2)     Status: None   Collection Time: 10/29/19  9:25 PM   Specimen: BLOOD  Result Value Ref Range Status   Specimen Description BLOOD LEFT HAND  Final   Special Requests   Final    BOTTLES DRAWN AEROBIC ONLY Blood Culture results may not be optimal due to an inadequate volume of blood received in culture bottles   Culture   Final    NO GROWTH 5 DAYS Performed at [facility], [place] [address], [place], [place] [address]    Report Status 11/03/2019 FINAL  Final  Culture, blood (routine x 2)     Status: None   Collection Time: 10/29/19  9:27 PM   Specimen: BLOOD  Result Value Ref Range Status   Specimen Description BLOOD RIGHT HAND  Final   Special Requests   Final    BOTTLES DRAWN AEROBIC ONLY Blood Culture adequate volume   Culture   Final    NO GROWTH 5 DAYS Performed at [facility], [place] [address], [place], [place] [address]    Report Status 11/03/2019 FINAL  Final  Culture, Urine     Status: Abnormal   Collection Time: 11/06/19 11:07 AM   Specimen: Urine, Random  Result Value Ref Range Status   Specimen Description URINE, RANDOM  Final   Special Requests   Final     NONE Performed at [facility], [place] [address] [place], [place] [address]    Culture 80,000 COLONIES/mL YEAST (A)  Final   Report Status 11/07/2019 FINAL  Final    Studies/Results: No results found.    Assessment/Plan:  INTERVAL HISTORY: Patient continues to have confusion  Principal Problem:   Cerebrovascular accident (CVA) of right basal ganglia (HCC) Active Problems:   Slow transit constipation   Acute on chronic anemia   Labile blood pressure   Labile blood glucose   Diabetes mellitus type 2 in nonobese (HCC)   Bacteremia   Endocarditis of mitral valve   Septic embolism (HCC)   Seizure (HCC)   Candidiasis   Encephalopathy, hepatic (HCC)   Hyperammonemia (HCC)   Sleep disturbance   Hemodialysis-associated hypotension   Supratherapeutic INR   Gastroesophageal reflux disease    Kendria Bona is a 68 y.o. female with  PsA native MV endocarditis but also concern that she has left sided disease and CNS septic emboli. She was on cefepime alone to which ciprofloxacin was added.  She has now had a seizure.  I  discontinued her ciprofloxacin but I have NEEDED continue her on cefepime to optimize CNS penetration and to complete her therapy for pseudomonal endocarditis.  I  REMAIN without another have a good option to go to beyond beta-lactam therapy to effectively treat her endocarditis and achieve adequate CNS penetration.    Apparently since I last saw patient and her son she is having increasing episodes of confusion.  She has not had fevers or new focal neurologic deficits that I have heard about.  I suspect her confusion is multifactorial in nature though she certainly could be having further embolic events to her central nervous system.  My understanding is that cardiothoracic surgery were not eager to operate on her.  I can order another MRI of the brain to reevaluate that area look for new infarcts or for example development of a brain abscess.  I do not  think however 1 to find new findings on the MRI.  I do not think either that there is a magical solution to this that I can offer from an ID standpoint  If MRI without findings of new emboli I will sign off  If she does have new embolic events would discuss with CT surgery and would also strongly consider palliative care consult.      LOS: 12 days   Alcide Evener 11/13/2019, 3:58 PM

## 2019-11-13 NOTE — Progress Notes (Signed)
Occupational Therapy Session Note  Patient Details  Name: [name] MRN: [id number] Date of Birth: [birth year]  Today's Date: 11/13/2019 OT Individual Time: [id number] OT Individual Time Calculation (min): 40 min  and Today's Date: 11/13/2019 OT Missed Time: 20 Minutes Missed Time Reason: Patient fatigue   Short Term Goals: Week 2:  OT Short Term Goal 1 (Week 2): Pt will perform shower transfer with mod A. OT Short Term Goal 2 (Week 2): Pt will perform toileting with mod A. OT Short Term Goal 3 (Week 2): Pt will complete LB dressing with mod assist OT Short Term Goal 4 (Week 2): Pt will tolerate sitting OOB 2 hrs to demonstrate increased activity tolerance  Skilled Therapeutic Interventions/Progress Updates:    Limited treatment session secondary to on bedrest, however RN stated okay to engage in therapeutic activity/bathing at bed level.  Pt appeared to recognize this therapist and reports that she has been asking for me.  Provided encouragement regarding situation and encouraged to engage in bathing at bed level this session - due to IV and elevated INR.  Pt engaged in rolling with mod assist to allow therapist to complete perineal hygiene and don clean brief.  Pt grimacing when rolling due to pain in back.  Pt stating "uh huh" when asked to engage in bathing tasks, however with no initiation even when provided with hand over hand assistance.  Required total assist for all bathing and dressing this session.  Max encouragement to engage with RN when providing AM meds.  Pt looking to this therapist for reassurance.  Pt fatigued throughout, keeping eyes closed for majority of session.  Left in sidelying on Lt for pain management and pressure relief.  Pt missed remaining 20 mins due to fatigue.  Therapy Documentation Precautions:  Precautions Precautions: Fall Precaution Comments: Strong L lateral lean, TLSO for mobility Required Braces or Orthoses: Other Brace(TLSO) Spinal Brace:  Thoracolumbosacral orthotic Spinal Brace Comments: for comfort with mobility Restrictions Weight Bearing Restrictions: No General: General OT Amount of Missed Time: 20 Minutes Vital Signs: Therapy Vitals Temp: (Refused) Pain: Pain Assessment Pain Scale: Faces Faces Pain Scale: Hurts little more Pain Location: Back Pain Descriptors / Indicators: Grimacing;Discomfort;Moaning   Therapy/Group: Individual Therapy  [name], [place] 11/13/2019, 8:16 AM

## 2019-11-13 NOTE — Progress Notes (Signed)
Physical Therapy Session Note  Patient Details  Name: [name] MRN: [id number] Date of Birth: Jun 14, 1951  Today's Date: 11/13/2019 PT Individual Time: [id number] PT Individual Time Calculation (min): 58 min   Short Term Goals: Week 1:  PT Short Term Goal 1 (Week 1): pt will transfer supine<>sit and sit<>supine min A PT Short Term Goal 1 - Progress (Week 1): Progressing toward goal PT Short Term Goal 2 (Week 1): Pt will transfer stand<>pivot with LRAD mod A PT Short Term Goal 2 - Progress (Week 1): Progressing toward goal PT Short Term Goal 3 (Week 1): Pt will perform car transfer with LRAD mod A PT Short Term Goal 3 - Progress (Week 1): Progressing toward goal Week 2:  PT Short Term Goal 1 (Week 2): Pt will transfer supine<>sit and sit<>supine with min A PT Short Term Goal 2 (Week 2): Pt will transfer stand<>pivot with LRAD mod A PT Short Term Goal 3 (Week 2): Pt will perform car transfer with LRAD mod A  Skilled Therapeutic Interventions/Progress Updates:   Received pt supine in bed, pt agreeable to therapy, and denied pain during session. Per orders, pt on bedrest during today's session. Consulted with RN regarding pt's current status. Session focused on bed mobility, LE and UE strength/ROM, and improved tolerance to activity. Pt had her eyes closed throughout session and required max verbal cues to open eyes and participate in therapy. Pt performed supine bilateral heel slides ranging from active to active assisted 2x8 reps with verbal cues for increased participation. Pt performed supine chest press with 1lb dowel x8 reps. Started with bilateral UE's but pt requested only to use LUE with activity. Pt appeared confused when therapist took dowel away, pt continued to state "I got the bar, I got the bar". Pt fixated on holding her phone throughout session and getting angry if phone was not in her hands. Son attempted to take phone from pt when phone rang but pt became agitated screaming  "No! No! Stop it!". Therapist and son attempted to reorient patient. Pt performed supine active assisted shoulder flexion 2x5 with 1lb dowel. Pt required max verbal cues to participate in activity and demonstrated increased LUE muscle spasms. Pt performed hip adduction 2x8 with tactile cues to squeeze pillow. Pt performed LTR 3x30 second hold with active assist from therapist. Pt rolled to L and R with min A and use of bedrails and hand over hand technique to grab bedrail. Pt performed bilateral clamshells 2x8 with decreased ROM on RLE. PA present for assessment during session. Concluded session with pt supine in bed, needs within reach, bed alarm on, and PA present.   Therapy Documentation Precautions:  Precautions Precautions: Fall Precaution Comments: Strong L lateral lean, TLSO for mobility Required Braces or Orthoses: Other Brace(TLSO) Spinal Brace: Thoracolumbosacral orthotic Spinal Brace Comments: for comfort with mobility Restrictions Weight Bearing Restrictions: No  Therapy/Group: Individual Therapy Alfonse Alpers PT, DPT   11/13/2019, 7:38 AM

## 2019-11-13 NOTE — Progress Notes (Signed)
[facility] PHYSICAL MEDICINE & REHABILITATION PROGRESS NOTE   Subjective/Complaints:  Discussed with SLP- has had fluctuating level of alertness x ~1wk, no acute change today  ROS: Denies CP, SOB, N/V/D  Objective:   No results found. Recent Labs    11/10/19 1405 11/10/19 1759  WBC 10.0 12.2*  HGB 9.4* 10.8*  HCT 30.1* 34.0*  PLT 215 209   Recent Labs    11/10/19 1405 11/10/19 1759  NA 136 134*  K 3.9 3.3*  CL 95* 93*  CO2 26 27  GLUCOSE 168* 173*  BUN 33* 9  CREATININE 7.14* 3.17*  CALCIUM 8.7* 8.5*    Intake/Output Summary (Last 24 hours) at 11/13/2019 0856 Last data filed at 11/13/2019 0600 Gross per 24 hour  Intake 675.11 ml  Output 20 ml  Net 655.11 ml     Physical Exam: Vital Signs Blood pressure (!) 95/54, pulse 88, temperature 98.4 F (36.9 C), temperature source Oral, resp. rate 19, height 5\' 7"  (1.702 m), weight 61.7 kg, SpO2 100 %. Constitutional: No distress . Vital signs reviewed. HENT: Normocephalic.  Atraumatic. Eyes: EOMI. No discharge. Cardiovascular: No JVD. Respiratory: Normal effort.  No stridor. GI: Non-distended. Skin: Warm and dry.  Intact. Psych: Flat.  Confused. Musc: No edema in extremities.  No tenderness in extremities. Neurological: lethargic but eyes open, does not answer questions, limited ability to follow instructions Motor: RUE: 5/5 proximal to distal RLE: HF, KE 3+/5, ADF 4/5, stable LUE: 4+/5 proximal to distal LLE: HF, KE 3/5, ADF 3+/5, stable  Assessment/Plan: 1. Functional deficits secondary to right basal ganglia stroke which require 3+ hours per day of interdisciplinary therapy in a comprehensive inpatient rehab setting.  Physiatrist is providing close team supervision and 24 hour management of active medical problems listed below.  Physiatrist and rehab team continue to assess barriers to discharge/monitor patient progress toward functional and medical goals  Care Tool:  Bathing    Body parts bathed by  patient: Right arm, Left arm, Chest, Abdomen, Front perineal area, Right upper leg, Left upper leg, Face   Body parts bathed by helper: Front perineal area, Buttocks, Right upper leg, Left upper leg, Right lower leg, Left lower leg, Face     Bathing assist Assist Level: Total Assistance - Patient < 25%     Upper Body Dressing/Undressing Upper body dressing   What is the patient wearing?: Pull over shirt    Upper body assist Assist Level: Minimal Assistance - Patient > 75%    Lower Body Dressing/Undressing Lower body dressing      What is the patient wearing?: Pants, Incontinence brief     Lower body assist Assist for lower body dressing: Total Assistance - Patient < 25%     Toileting Toileting    Toileting assist Assist for toileting: Maximal Assistance - Patient 25 - 49%     Transfers Chair/bed transfer  Transfers assist     Chair/bed transfer assist level: Dependent - mechanical lift(stedy)     Locomotion Ambulation   Ambulation assist   Ambulation activity did not occur: Safety/medical concerns(back pain, bilateral LE weakness, poor bilateral knee control)  Assist level: 2 helpers Assistive device: Walker-rolling Max distance: 34ft   Walk 10 feet activity   Assist  Walk 10 feet activity did not occur: Safety/medical concerns(back pain, bilateral LE weakness, poor bilateral knee control)  Assist level: 2 helpers Assistive device: Walker-rolling   Walk 50 feet activity   Assist Walk 50 feet with 2 turns activity did not occur:  Safety/medical concerns(back pain, bilateral LE weakness, poor bilateral knee control)  Assist level: 2 helpers Assistive device: Walker-rolling    Walk 150 feet activity   Assist Walk 150 feet activity did not occur: Safety/medical concerns(back pain, bilateral LE weakness, poor bilateral knee control)         Walk 10 feet on uneven surface  activity   Assist Walk 10 feet on uneven surfaces activity did not  occur: Safety/medical concerns(back pain, bilateral LE weakness, poor bilateral knee control)         Wheelchair     Assist Will patient use wheelchair at discharge?: Yes Type of Wheelchair: Manual Wheelchair activity did not occur: Safety/medical concerns(Pt could not tolerate sitting in WC for long enough to complete activity due to low back pain)  Wheelchair assist level: Minimal Assistance - Patient > 75% Max wheelchair distance: 37ft    Wheelchair 50 feet with 2 turns activity    Assist    Wheelchair 50 feet with 2 turns activity did not occur: Safety/medical concerns(Pt could not tolerate sitting in WC for long enough to complete activity due to low back pain)       Wheelchair 150 feet activity     Assist  Wheelchair 150 feet activity did not occur: Safety/medical concerns(Pt could not tolerate sitting in WC for long enough to complete activity due to low back pain)       Blood pressure (!) 95/54, pulse 88, temperature 98.4 F (36.9 C), temperature source Oral, resp. rate 19, height 5\' 7"  (1.702 m), weight 61.7 kg, SpO2 100 %.    Medical Problem List and Plan: 1.  Impaired gait with left side weakness secondary to right basal ganglia infarction as well as small cortical infarct left frontal lobe  Bedrest until INR<4.5   CT head on 12/13 personally reviewed, showing some improvement, MRI personally reviewed, stable  Chest x-ray personally reviewed, unremarkable for infection  Discussed with neurology results of the EEG -findings possibly related to uremia +/- cefepime  Discussed with psychiatry, continue medical management 2.  Antithrombotics: -DVT/anticoagulation: Chronic Coumadin for right IJ acute DVT.  Plan anticoagulation x3 months  INR critical value of 4.8 on 12/20, per pharm protocol held yesterday              -antiplatelet therapy: N/A 3. Pain Management chronic back and left hip pain: Neurontin 100 mg held oxycodone as needed only received 1  dose in last 72h .  TLSO back brace for comfort 4. Mood: Provide emotional support             -antipsychotic agents: N/A 5. Neuropsych: This patient is not capable of making decisions on her own behalf. 6. Skin/Wound Care: Routine skin checks 7. Fluids/Electrolytes/Nutrition: Routine in and outs, may need TF if po intake does not improve  8.  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  New HD tunneled catheter placed 10/26/2019. 9.  ID/recurrent Pseudomonas bacteremia.    Continue Maxipime as directed with infectious disease to discuss duration of antibiotic therapies anticipate 6 weeks.  Cipro DC'd by ID due to seizure risk 10.  Hemodialysis MWF.  Follow-up per renal services.  11.  Diabetes mellitus.  Hemoglobin A1c 7.6.  SSI. CBG (last 3)  Recent Labs    11/12/19 0611 11/12/19 1126 11/12/19 1623  GLUCAP 179* 139* 148*  fair control- poor intake , SSI for management  12.  Hypertension.  Imdur 30 mg daily  Coreg 6.25 mg twice  daily, increased to 12.5 on 12/15, parameters placed.    Monitor with increased mobility.  Soft on 12/20, recs per nephro 13.  Acute on chronic anemia.    Hemoglobin 10.8 on 12/18  Continue to monitor 14. Constipation:  See #16  Improving 15.  Candidiasis  Diflucan ordered on 12/16  Repeat Ucx remains pending on 12/20 16.  Hyperammonemia with hepatic encephalopathy  Lactulose started on 12/17, encourage compliance 17. Sleep disturance  Melatonin added on 12/17  Sleep chart ordered 18.  Leukocytosis  WBCs 12.2 on 12/18, labs with HD  Afebrile  Continue to monitor 19.  GERD  Home Protonix ordered on 12/20   LOS: 12 days A FACE TO [place] E Antar Milks 11/13/2019, 8:56 AM

## 2019-11-13 NOTE — Progress Notes (Signed)
[facility] Progress Note   Dialysis Orders: [facility] MWF3.5 hours TDC - mult failed accesses Profile 1 - pressure drops very easily;max is pull 1.1 an hour; do not pull rinseback revaclear 300 dialyzer 400/600 2 K 2.5 Ca EDW 62.5  Epo 1200 U q HD; iron 50 mg/weekly 1.5 mcg hectoral each tx  Assessment/Plan:  1. Recurrent Pseudomonas bacteremia (Likely HD CRB) with evidence of mitral valve endocarditisthat is post [facility] exchange for infection focus control. Seen by CTA w/no imminent plan for surgical intervention-this will be reevaluated as an outpatient following completion of antibiotic therapy with cefepime; now off cipro as of 12/15 d/t witnessed seizure.  2. ESRD: MWF -HD today start on  added K bath 3. Anemia:Hgb 10.8,continue ESA-- no Fe for the short term due to currently being treatment for infection. 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  5. 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 Ongoing inpatient rehabilitation for management of neurological deficits. Repeat MRI showedno acute changes. INR 4.8 ^ yesterday MS seems to vary 6. Hypertension/volume : BP low today -gradually titrating volume down as BP allows - BP on the low side today - weight have been +/- prior EDW and likely losing wt due to variable intake - last coreg yesterday am - may need a bolus of fluid if pre BP still low - need to keep SBP > 110 and may need one dose albumin  7.Nutrition -diet liberalized to regular - has supplements/multivits ordered - alb very low - not eating much - contributing to weakness 8. MS change since 12/13 - no obvious findings on work up. Per notes may have been cefepime related -MS seems to wax and wane . Neurontin 100 mg per day stopped 12/14 9.Yeast UTI >80 K-diflucan started 12/16 10. Deconditioning - walked in hall pre HD Friday and has  not done much of anything since per PT- doesn't answer my question but then can speak clearly like "my toe hurts"    Myriam Jacobson, PA-C [place] [phone] 11/13/2019,10:56 AM  LOS: 12 days   Subjective:   C/o toe pain (sheet pulling on nails which need to be cut), back pain - chronic . Per son multiple stools over the weekend.  Objective Vitals:   11/12/19 0307 11/12/19 0656 11/12/19 1111 11/12/19 1451  BP: (!) 88/63 (!) 84/54 (!) 92/50 (!) 95/54  Pulse: 96 89 84 88  Resp: 16 17 19 19   Temp: 98.6 F (37 C) 98.5 F (36.9 C) 98.2 F (36.8 C) 98.4 F (36.9 C)  TempSrc: Oral  Oral Oral  SpO2: 99% 100% 100% 100%  Weight:      Height:       Physical Exam General: c/o of her turn hurting while pressing on her stomach Heart: RRR Lungs: no rales Abdomen: soft mild generalized tenderness- winces some Extremities: no LE edema Dialysis Access:  Right IJ [facility]   Additional Objective Labs: Basic Metabolic Panel: Recent Labs  Lab 11/08/19 1254 11/10/19 1405 11/10/19 1759  NA 128* 136 134*  K 4.7 3.9 3.3*  CL 90* 95* 93*  CO2 24 26 27   GLUCOSE 200* 168* 173*  BUN 38* 33* 9  CREATININE 7.32* 7.14* 3.17*  CALCIUM 8.8* 8.7* 8.5*  PHOS 4.7* 3.5 2.2*   Liver Function Tests: Recent Labs  Lab 11/06/19 1511 11/08/19 1254 11/10/19 1405 11/10/19 1759  AST 18  --   --   --  ALT 12  --   --   --   ALKPHOS 86  --   --   --   BILITOT 0.2*  --   --   --   PROT 7.5  --   --   --   ALBUMIN 2.1* 2.2* 2.2* 2.4*   No results for input(s): LIPASE, AMYLASE in the last 168 hours. CBC: Recent Labs  Lab 11/07/19 0606 11/08/19 0510 11/09/19 0731 11/10/19 1405 11/10/19 1759  WBC 9.1 7.8 9.2 10.0 12.2*  NEUTROABS  --   --  6.7  --   --   HGB 9.5* 8.8* 10.3* 9.4* 10.8*  HCT 30.2* 27.9* 32.1* 30.1* 34.0*  MCV 92.4 92.4 92.2 94.4 91.9  PLT 221 211 272 215 209   Blood Culture    Component Value Date/Time   SDES URINE, RANDOM 11/06/2019 1107    SPECREQUEST  11/06/2019 1107    NONE Performed at [facility], [place] [address] [place], [place] [address]    CULT 80,000 COLONIES/mL YEAST (A) 11/06/2019 1107   REPTSTATUS 11/07/2019 FINAL 11/06/2019 1107    Cardiac Enzymes: No results for input(s): CKTOTAL, CKMB, CKMBINDEX, TROPONINI in the last 168 hours. CBG: Recent Labs  Lab 11/11/19 1614 11/11/19 2112 11/12/19 0611 11/12/19 1126 11/12/19 1623  GLUCAP 182* 175* 179* 139* 148*   Iron Studies: No results for input(s): IRON, TIBC, TRANSFERRIN, FERRITIN in the last 72 hours. Lab Results  Component Value Date   INR 4.8 (HH) 11/12/2019   INR 2.9 (H) 11/11/2019   INR 2.4 (H) 11/10/2019   Studies/Results: No results found. Medications: . sodium chloride    . sodium chloride    . sodium chloride 50 mL/hr at 11/13/19 0600  . ceFEPime (MAXIPIME) IV Stopped (11/12/19 2106)   . acetaminophen  500 mg Oral TID  . calcium carbonate  1 tablet Oral 2 times per day  . carvedilol  12.5 mg Oral BID  . Chlorhexidine Gluconate Cloth  6 each Topical Q0600  . darbepoetin (ARANESP) injection - DIALYSIS  60 mcg Intravenous Q Mon-HD  . doxercalciferol  1.5 mcg Intravenous Q M,W,F-HD  . feeding supplement (ENSURE ENLIVE)  237 mL Oral BID BM  . insulin aspart  0-9 Units Subcutaneous TID WC  . isosorbide mononitrate  30 mg Oral QPC lunch  . lactulose  20 g Oral TID  . Melatonin  3 mg Oral QHS  . multivitamin  1 tablet Oral QPC lunch  . pantoprazole  40 mg Oral Daily  . Warfarin - Pharmacist Dosing Inpatient   Does not apply [phone]

## 2019-11-13 NOTE — Progress Notes (Signed)
Speech Language Pathology Daily Session Note  Patient Details  Name: [name] MRN: [id number] Date of Birth: September 27, 1951  Today's Date: 11/13/2019 SLP Individual Time: [id number] SLP Individual Time Calculation (min): 45 min  Short Term Goals: Week 2: SLP Short Term Goal 1 (Week 2): Pt will demonstrate functional problem solving during basic tasks with Min A verbal/visual cues SLP Short Term Goal 2 (Week 2): Pt will demonstrate recall of day to day/new information with Min A verbal cues for use of compensatory memory strategies SLP Short Term Goal 3 (Week 2): Pt will selectively attend to functional tasks with Supervision A verbal cues for redirection SLP Short Term Goal 4 (Week 2): Pt will demonstrate anticipatory awareness by stating 2 ADLs she will need assistance with upon her return home with Min A verbal/visual cues  Skilled Therapeutic Interventions: Pt was seen for skilled ST targeting cognitive skills. Pt presented with fluctuating levels of confusion and alertness throughout 45 minute session. Upon therapist's arrival she was independently oriented to place and oriented to date with Min A verbal cues for use of use of calendar. Despite Max encouragement, pt refused breakfast and only accepted 2 sips of juice for breakfast. She required frequent rest breaks and intermittent Max A verbal and tactile cues for basic problem solving during functional tasks to alleviate reported sacral pain, putting on glasses, using call bell/remote etc. Pt matched colored blocks to a 9 color pattern board with Mod A verbal and visual cues for initiation and problem solving. Pt became increasingly confused, and aware of confusion - SLP prompted pt to ask questions in order to reorient and alleviate fearfulness/confusion. Pt became increasingly fatigued and was unable to maintain alertness to participate further. Pt was repositioned to laying comfortably semi-reclined in bed, alarm set and needs within  reach. Continue per current plan of care.       Pain Pain Assessment Pain Scale: Faces Faces Pain Scale: Hurts a little bit Pain Type: Acute pain Pain Location: Sacrum Pain Orientation: Medial Pain Descriptors / Indicators: Grimacing;Discomfort Pain Onset: Unable to tell Pain Intervention(s): Repositioned Multiple Pain Sites: No  Therapy/Group: Individual Therapy  Arbutus Leas 11/13/2019, 9:20 AM

## 2019-11-13 NOTE — Progress Notes (Signed)
ANTICOAGULATION CONSULT NOTE - Follow-Up  Pharmacy Consult for Warfarin Indication: RIJ DVT (10/21/19), CVA (10/20/19)  No Known Allergies  Patient Measurements: Height: 5\' 7"  (170.2 cm) Weight: 133 lb 2.5 oz (60.4 kg) IBW/kg (Calculated) : 61.6  Vital Signs: Temp: 98.8 F (37.1 C) (12/21 1240) Temp Source: Oral (12/21 1240) BP: 112/65 (12/21 1400) Pulse Rate: 90 (12/21 1400)  Labs: Recent Labs    11/10/19 1759 11/11/19 0608 11/12/19 0822 11/13/19 1134 11/13/19 1248  HGB 10.8*  --   --  9.1*  --   HCT 34.0*  --   --  28.4*  --   PLT 209  --   --  199  --   LABPROT  --  30.6* 45.3* 48.5*  --   INR  --  2.9* 4.8* 5.3*  --   CREATININE 3.17*  --   --   --  8.41*    Estimated Creatinine Clearance: 6 mL/min (A) (by C-G formula based on SCr of 8.41 mg/dL (H)).  Assessment: 68 yr old female with ESRD who presented on 10/20/19 with new CVA and RIJ DVT 10/21/19. Pharmacy consulted to dose Warfarin.   INR continues to trend up to 5.3, possibly related to DDI with fluconazole.  Last fluconazole dose 12/16.  Last warfarin dose 12/18.  No issues with bleeding reported.  Goal of Therapy:  INR 2-3 Monitor platelets by anticoagulation protocol: Yes   Plan:  - HOLD warfarin x 1 day given supratherapeutic INR - Daily PT/INR, CBC Q Mon - Will continue to monitor for any signs/symptoms of bleeding and will follow up with PT/INR in the a.m.  [employer], Pharm.D., BCPS Clinical Pharmacist Clinical phone for 11/13/2019 from 8:30-4:00 is [phone].  **Pharmacist phone directory can be found on [email or web address] listed under [place].  11/13/2019 2:29 PM

## 2019-11-13 NOTE — Progress Notes (Signed)
At 0100, Attempted to assess patient, check vitals, CBG, etc., patient became agitated and yelling. Denies pain. Unable to tell this RN what was wrong. Felt staff entering her room to hurt her. 0.9% NS infusing into NSL to left arm without problems. Refused liquids or snack, not trusting of staff. Patrici Ranks A

## 2019-11-13 NOTE — Progress Notes (Signed)
Attempted again to assess patient, continues to be non trusting and agitated. Son called to unit, updated on patient's night. He's wanting to come up early, at 0900 to speak with [name], PA and or Doctor R/T patients increased confusion. Also asked about staying night to keep patient calmer and maybe more compliant with meds, vitals, CBG's and assessments. Spoke with [name], PA this AM about patient's night. [name]

## 2019-11-14 ENCOUNTER — Inpatient Hospital Stay (HOSPITAL_COMMUNITY): Payer: Medicare PPO | Admitting: Occupational Therapy

## 2019-11-14 ENCOUNTER — Inpatient Hospital Stay (HOSPITAL_COMMUNITY): Payer: Medicare PPO

## 2019-11-14 ENCOUNTER — Inpatient Hospital Stay (HOSPITAL_COMMUNITY): Payer: Medicare PPO | Admitting: Speech Pathology

## 2019-11-14 DIAGNOSIS — I059 Rheumatic mitral valve disease, unspecified: Secondary | ICD-10-CM

## 2019-11-14 DIAGNOSIS — L89150 Pressure ulcer of sacral region, unstageable: Secondary | ICD-10-CM

## 2019-11-14 LAB — PROTIME-INR
INR: 5.2 (ref 0.8–1.2)
Prothrombin Time: 47.6 seconds — ABNORMAL HIGH (ref 11.4–15.2)

## 2019-11-14 LAB — GLUCOSE, CAPILLARY
Glucose-Capillary: 176 mg/dL — ABNORMAL HIGH (ref 70–99)
Glucose-Capillary: 244 mg/dL — ABNORMAL HIGH (ref 70–99)
Glucose-Capillary: 130 mg/dL — ABNORMAL HIGH (ref 70–99)
Glucose-Capillary: 258 mg/dL — ABNORMAL HIGH (ref 70–99)

## 2019-11-14 LAB — AMMONIA: Ammonia: 25 umol/L (ref 9–35)

## 2019-11-14 MED ORDER — CHLORHEXIDINE GLUCONATE CLOTH 2 % EX PADS
6.0000 | MEDICATED_PAD | Freq: Every day | CUTANEOUS | Status: DC
  Administered 2019-11-14 – 2019-11-28 (×13): 6 via TOPICAL

## 2019-11-14 MED ORDER — ONDANSETRON HCL 4 MG/2ML IJ SOLN
4.0000 mg | Freq: Once | INTRAMUSCULAR | Status: AC
  Administered 2019-11-14: 4 mg via INTRAVENOUS
  Filled 2019-11-14: qty 2

## 2019-11-14 MED ORDER — QUETIAPINE FUMARATE 25 MG PO TABS
12.5000 mg | ORAL_TABLET | Freq: Every day | ORAL | Status: DC
  Administered 2019-11-14: 21:00:00 12.5 mg via ORAL
  Filled 2019-11-14 (×2): qty 1

## 2019-11-14 NOTE — Progress Notes (Signed)
[facility] Progress Note   Dialysis Orders: [facility] MWF3.5 hours TDC - mult failed accesses Profile 1 - pressure drops very easily;max is pull 1.1 an hour; do not pull rinseback revaclear 300 dialyzer 400/600 2 K 2.5 Ca EDW 62.5  Epo 1200 U q HD; iron 50 mg/weekly 1.5 mcg hectoral each tx  Assessment/Plan:  1. Recurrent Pseudomonas bacteremia (Likely HD CRB) with evidence of mitral valve endocarditisthat is post [facility] exchange for infection focus control. Seen by CTA w/no imminent plan for surgical intervention-this will be reevaluated as an outpatient following completion of antibiotic therapy with cefepime; now off cipro as of 12/15 d/t witnessed seizure.  2. ESRD: MWF -HD Wednesday hold heparin due to high INR unless catheter flow issues 3. Anemia:hgb  9.1 continue ESA-- no Fe for the short term due to currently being treatment for infection. 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  5. 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 Ongoing inpatient rehabilitation for management of neurological deficits. Repeat MRI showedno acute changes. INR 5.2 increasing  6. Hypertension/volume : net UF 800 ml 12/21 post wt 59.6 - keep SBP > 110 7.Nutrition -diet liberalized to regular - has supplements/multivits ordered - alb very low - not eating much - contributing to weakness 8. MS change since 12/13 - no obvious findings on work up. Per notes may have been cefepime related -MS seems to wax and wane . Neurontin 100 mg per day stopped 12/14 NH3 25 - per primary 9.Yeast UTI >80 K-s/p diflucan 10. Deconditioning - walked in hall pre HD Friday and has not done much of anything since per PT- doesn't answer my question but then can speak clearly like "my toe hurts" ; discussed with PA - has seen similar incongruencies with verbal interactions   Myriam Jacobson, PA-C [place] 11/14/2019,11:36 AM  LOS: 13 days   Subjective:   Tells me she wants to go home. Restless much of the night.  Hasn't been OOB yet today. HAd some nausea  Objective Vitals:   11/13/19 1530 11/13/19 1600 11/13/19 1613 11/14/19 0326  BP: (!) 100/58 106/61 109/64 132/75  Pulse: 96 100 99 96  Resp:   (!) 22 16  Temp:   97.6 F (36.4 C) 98 F (36.7 C)  TempSrc:   Oral Oral  SpO2:   95% 100%  Weight:   59.6 kg 59.9 kg  Height:       Physical Exam General: NAD , more alert and interactive today Heart: RRR Lungs: no rales Abdomen: soft NT Extremities: no LE edema Dialysis Access:  Right IJ [facility]   Additional Objective Labs: Basic Metabolic Panel: Recent Labs  Lab 11/10/19 1405 11/10/19 1759 11/13/19 1248  NA 136 134* 131*  K 3.9 3.3* 4.4  CL 95* 93* 94*  CO2 26 27 22   GLUCOSE 168* 173* 198*  BUN 33* 9 57*  CREATININE 7.14* 3.17* 8.41*  CALCIUM 8.7* 8.5* 8.3*  PHOS 3.5 2.2* 5.2*   Liver Function Tests: Recent Labs  Lab 11/10/19 1405 11/10/19 1759 11/13/19 1248  ALBUMIN 2.2* 2.4* 1.7*   No results for input(s): LIPASE, AMYLASE in the last 168 hours. CBC: Recent Labs  Lab 11/08/19 0510 11/09/19 0731 11/10/19 1405 11/10/19 1759 11/13/19 1134  WBC 7.8 9.2 10.0 12.2* 12.6*  NEUTROABS  --  6.7  --   --   --   HGB 8.8* 10.3*  9.4* 10.8* 9.1*  HCT 27.9* 32.1* 30.1* 34.0* 28.4*  MCV 92.4 92.2 94.4 91.9 91.0  PLT 211 272 215 209 199   Blood Culture    Component Value Date/Time   SDES URINE, RANDOM 11/06/2019 1107   SPECREQUEST  11/06/2019 1107    NONE Performed at [facility], [place] [address], [place], [place] [address]    CULT 80,000 COLONIES/mL YEAST (A) 11/06/2019 1107   REPTSTATUS 11/07/2019 FINAL 11/06/2019 1107    Cardiac Enzymes: No results for input(s): CKTOTAL, CKMB, CKMBINDEX, TROPONINI in the last 168 hours. CBG: Recent Labs  Lab 11/12/19 1623 11/13/19 1135 11/13/19 1703  11/13/19 2114 11/14/19 0630  GLUCAP 148* 159* 122* 198* 176*   Iron Studies: No results for input(s): IRON, TIBC, TRANSFERRIN, FERRITIN in the last 72 hours. Lab Results  Component Value Date   INR 5.2 (HH) 11/14/2019   INR 5.3 (HH) 11/13/2019   INR 4.8 (HH) 11/12/2019   Studies/Results: DG Abd 1 View  Result Date: 11/14/2019 CLINICAL DATA:  Abdominal pain EXAM: ABDOMEN - 1 VIEW COMPARISON:  None. FINDINGS: Diffusely gas distended colon. No dilated small bowel is visible. No abnormal calcification. IMPRESSION: Diffusely gas distended colon. Electronically Signed   By: Ulyses Jarred M.D.   On: 11/14/2019 03:52   MR BRAIN WO CONTRAST  Result Date: 11/14/2019 CLINICAL DATA:  Septic arterial embolism. Native endocarditis of the mitral valve with concern for CNS disease. EXAM: MRI HEAD WITHOUT CONTRAST TECHNIQUE: Multiplanar, multiecho pulse sequences of the brain and surrounding structures were obtained without intravenous contrast. COMPARISON:  Eight days ago FINDINGS: Brain: Subacute to chronic infarcts in the bilateral corona radiata and centrum semiovale. Chronic infarct in the right caudate head. Chronic small left inferior cerebellar infarcts. No adverse features or liquefaction. No acute or interval infarct. No hemorrhage, hydrocephalus, or collection. Vascular: Normal flow voids Skull and upper cervical spine: Generally hypointense marrow signal which could be from patient's end-stage renal disease or anemia Sinuses/Orbits: Negative IMPRESSION: Stable non-acute infarcts.  No evidence of abscess or acute process. Electronically Signed   By: Monte Fantasia M.D.   On: 11/14/2019 09:23   Medications: . sodium chloride    . sodium chloride    . ceFEPime (MAXIPIME) IV Stopped (11/13/19 2130)   . acetaminophen  500 mg Oral TID  . calcium carbonate  1 tablet Oral 2 times per day  . carvedilol  12.5 mg Oral BID  . Chlorhexidine Gluconate Cloth  6 each Topical Q0600  . darbepoetin  (ARANESP) injection - DIALYSIS  60 mcg Intravenous Q Mon-HD  . doxercalciferol  1.5 mcg Intravenous Q M,W,F-HD  . feeding supplement (ENSURE ENLIVE)  237 mL Oral BID BM  . insulin aspart  0-9 Units Subcutaneous TID WC  . isosorbide mononitrate  30 mg Oral QPC lunch  . lactulose  20 g Oral TID  . Melatonin  3 mg Oral QHS  . multivitamin  1 tablet Oral QPC lunch  . pantoprazole  40 mg Oral Daily  . Warfarin - Pharmacist Dosing Inpatient   Does not apply [phone]

## 2019-11-14 NOTE — Progress Notes (Signed)
ANTICOAGULATION CONSULT NOTE - Follow-Up  Pharmacy Consult for Warfarin Indication: RIJ DVT (10/21/19), CVA (10/20/19)  No Known Allergies  Patient Measurements: Height: 5\' 7"  (170.2 cm) Weight: 132 lb 0.9 oz (59.9 kg) IBW/kg (Calculated) : 61.6  Vital Signs: Temp: 98 F (36.7 C) (12/22 0326) Temp Source: Oral (12/22 0326) BP: 132/75 (12/22 0326) Pulse Rate: 96 (12/22 0326)  Labs: Recent Labs    11/12/19 0822 11/13/19 1134 11/13/19 1248 11/14/19 0522  HGB  --  9.1*  --   --   HCT  --  28.4*  --   --   PLT  --  199  --   --   LABPROT 45.3* 48.5*  --  47.6*  INR 4.8* 5.3*  --  5.2*  CREATININE  --   --  8.41*  --     Estimated Creatinine Clearance: 6 mL/min (A) (by C-G formula based on SCr of 8.41 mg/dL (H)).  Assessment: 68 yr old female with ESRD who presented on 10/20/19 with new CVA and RIJ DVT 10/21/19. Pharmacy consulted to dose Warfarin.   INR continues remains elevated at 5.2.  Last warfarin dose 12/18.  No issues with bleeding reported.  Goal of Therapy:  INR 2-3 Monitor platelets by anticoagulation protocol: Yes   Plan:  - HOLD warfarin x 1 day given supratherapeutic INR - Daily PT/INR, CBC Q Mon - Will continue to monitor for any signs/symptoms of bleeding and will follow up with PT/INR in the a.m.  [employer], Pharm.D., BCPS Clinical Pharmacist Clinical phone for 11/14/2019 from 8:30-4:00 is [phone].  **Pharmacist phone directory can be found on [email or web address] listed under [place].  11/14/2019 1:31 PM

## 2019-11-14 NOTE — Plan of Care (Signed)
New unstageable wound on sacrum. New orders for santyl continue nepro and multivitamin

## 2019-11-14 NOTE — Progress Notes (Signed)
Occupational Therapy Session Note  Patient Details  Name: [name] MRN: [id number] Date of Birth: 15-Apr-1951  Today's Date: 11/14/2019 OT Individual Time: 1300-1349 OT Individual Time Calculation (min): 49 min  and Today's Date: 11/14/2019 OT Missed Time: 26 Minutes Missed Time Reason: Patient fatigue   Short Term Goals: Week 2:  OT Short Term Goal 1 (Week 2): Pt will perform shower transfer with mod A. OT Short Term Goal 2 (Week 2): Pt will perform toileting with mod A. OT Short Term Goal 3 (Week 2): Pt will complete LB dressing with mod assist OT Short Term Goal 4 (Week 2): Pt will tolerate sitting OOB 2 hrs to demonstrate increased activity tolerance  Skilled Therapeutic Interventions/Progress Updates:    Limited treatment session due to agitation with increased confusion.  Pt received supine in bed clutching her phone.  Pt still on active bedrest orders but RN stating that she can participate in bed level therapies.  Attempted to encourage pt to engage in Christmas ornament craft.  Pt to place small balls in to ornament sphere, encouraged pt to hold ornament in one hand while placing pieces inside with other.  Pt refused to release phone but would switch it from one hand to the other to allow her to place items with alternating UE.  Pt demonstrating mild incoordination in LUE and decreased attention to task.  Unsure if cognitive component vs visual impairment.  Pt agreeable to bathing UB at bed level.  Pt initially refusing to participate actively in bathing, but eventually took wash cloth and washed face.  Pt refused to wash underarms and upper body, requesting therapist to complete for her.  Engaged in limited rolling to allow for pressure relief on buttocks due to new wound.  Assisted pt with phoning 2 of her sisters as pt with increased confusion, but neither answered.  Pt reporting fatigue and requesting to rest.  Pt remained in sidelying with all needs in reach.  Therapy  Documentation Precautions:  Precautions Precautions: Fall Precaution Comments: Strong L lateral lean, TLSO for mobility Required Braces or Orthoses: Other Brace(TLSO) Spinal Brace: Thoracolumbosacral orthotic Spinal Brace Comments: for comfort with mobility Restrictions Weight Bearing Restrictions: No General: General OT Amount of Missed Time: 26 Minutes PT Missed Treatment Reason: Patient fatigue Vital Signs: Therapy Vitals Temp: 98.6 F (37 C) Temp Source: Oral Pulse Rate: 84 Resp: 16 BP: (!) 90/57 Patient Position (if appropriate): Lying Oxygen Therapy SpO2: 100 % O2 Device: Room Air Pain:  Pt with c/o pain in back with mobility.  Medicated during session.   Therapy/Group: Individual Therapy  [name] 11/14/2019, 2:17 PM

## 2019-11-14 NOTE — Progress Notes (Signed)
[place] PHYSICAL MEDICINE & REHABILITATION PROGRESS NOTE   Subjective/Complaints: Patient seen laying in bed this morning.  She states she slept well overnight.  Son at bedside states patient slept on/off.  Nursing notes sacral wound.  Discussed with son and PA, patient appears to have had the symptoms before and antibiotics.  She was seen by ID yesterday, notes reviewed an MRI ordered.  ROS: Denies CP, SOB, N/V/D  Objective:   DG Abd 1 View  Result Date: 11/14/2019 CLINICAL DATA:  Abdominal pain EXAM: ABDOMEN - 1 VIEW COMPARISON:  None. FINDINGS: Diffusely gas distended colon. No dilated small bowel is visible. No abnormal calcification. IMPRESSION: Diffusely gas distended colon. Electronically Signed   By: Ulyses Jarred M.D.   On: 11/14/2019 03:52   MR BRAIN WO CONTRAST  Result Date: 11/14/2019 CLINICAL DATA:  Septic arterial embolism. Native endocarditis of the mitral valve with concern for CNS disease. EXAM: MRI HEAD WITHOUT CONTRAST TECHNIQUE: Multiplanar, multiecho pulse sequences of the brain and surrounding structures were obtained without intravenous contrast. COMPARISON:  Eight days ago FINDINGS: Brain: Subacute to chronic infarcts in the bilateral corona radiata and centrum semiovale. Chronic infarct in the right caudate head. Chronic small left inferior cerebellar infarcts. No adverse features or liquefaction. No acute or interval infarct. No hemorrhage, hydrocephalus, or collection. Vascular: Normal flow voids Skull and upper cervical spine: Generally hypointense marrow signal which could be from patient's end-stage renal disease or anemia Sinuses/Orbits: Negative IMPRESSION: Stable non-acute infarcts.  No evidence of abscess or acute process. Electronically Signed   By: Monte Fantasia M.D.   On: 11/14/2019 09:23   Recent Labs    11/13/19 1134  WBC 12.6*  HGB 9.1*  HCT 28.4*  PLT 199   Recent Labs    11/13/19 1248  NA 131*  K 4.4  CL 94*  CO2 22  GLUCOSE 198*   BUN 57*  CREATININE 8.41*  CALCIUM 8.3*    Intake/Output Summary (Last 24 hours) at 11/14/2019 1118 Last data filed at 11/14/2019 0717 Gross per 24 hour  Intake 282 ml  Output 1000 ml  Net -718 ml     Physical Exam: Vital Signs Blood pressure 132/75, pulse 96, temperature 98 F (36.7 C), temperature source Oral, resp. rate 16, height 5\' 7"  (1.702 m), weight 59.9 kg, SpO2 100 %. Constitutional: No distress . Vital signs reviewed. HENT: Normocephalic.  Atraumatic. Eyes: EOMI. No discharge. Cardiovascular: No JVD. Respiratory: Normal effort.  No stridor. GI: Non-distended. Skin: Sacral ulcer with slough Psych: Flat.   Musc: No edema in extremities.  No tenderness in extremities. Neurological: Alert and oriented x3 Motor: RUE: 5/5 proximal to distal RLE: HF, KE 3+/5, ADF 4/5, stable LUE: 4+/5 proximal to distal LLE: HF, KE 3/5, ADF 3+/5, unchanged  Assessment/Plan: 1. Functional deficits secondary to right basal ganglia stroke which require 3+ hours per day of interdisciplinary therapy in a comprehensive inpatient rehab setting.  Physiatrist is providing close team supervision and 24 hour management of active medical problems listed below.  Physiatrist and rehab team continue to assess barriers to discharge/monitor patient progress toward functional and medical goals  Care Tool:  Bathing    Body parts bathed by patient: Right arm, Left arm, Chest, Abdomen, Front perineal area, Right upper leg, Left upper leg, Face   Body parts bathed by helper: Front perineal area, Buttocks, Right upper leg, Left upper leg, Right lower leg, Left lower leg, Face     Bathing assist Assist Level: Total Assistance - Patient <  25%     Upper Body Dressing/Undressing Upper body dressing   What is the patient wearing?: Pull over shirt    Upper body assist Assist Level: Minimal Assistance - Patient > 75%    Lower Body Dressing/Undressing Lower body dressing      What is the patient  wearing?: Pants, Incontinence brief     Lower body assist Assist for lower body dressing: Total Assistance - Patient < 25%     Toileting Toileting    Toileting assist Assist for toileting: Maximal Assistance - Patient 25 - 49%     Transfers Chair/bed transfer  Transfers assist     Chair/bed transfer assist level: Dependent - mechanical lift(stedy)     Locomotion Ambulation   Ambulation assist   Ambulation activity did not occur: Safety/medical concerns(back pain, bilateral LE weakness, poor bilateral knee control)  Assist level: 2 helpers Assistive device: Walker-rolling Max distance: 56ft   Walk 10 feet activity   Assist  Walk 10 feet activity did not occur: Safety/medical concerns(back pain, bilateral LE weakness, poor bilateral knee control)  Assist level: 2 helpers Assistive device: Walker-rolling   Walk 50 feet activity   Assist Walk 50 feet with 2 turns activity did not occur: Safety/medical concerns(back pain, bilateral LE weakness, poor bilateral knee control)  Assist level: 2 helpers Assistive device: Walker-rolling    Walk 150 feet activity   Assist Walk 150 feet activity did not occur: Safety/medical concerns(back pain, bilateral LE weakness, poor bilateral knee control)         Walk 10 feet on uneven surface  activity   Assist Walk 10 feet on uneven surfaces activity did not occur: Safety/medical concerns(back pain, bilateral LE weakness, poor bilateral knee control)         Wheelchair     Assist Will patient use wheelchair at discharge?: Yes Type of Wheelchair: Manual Wheelchair activity did not occur: Safety/medical concerns(Pt could not tolerate sitting in WC for long enough to complete activity due to low back pain)  Wheelchair assist level: Minimal Assistance - Patient > 75% Max wheelchair distance: 41ft    Wheelchair 50 feet with 2 turns activity    Assist    Wheelchair 50 feet with 2 turns activity did not  occur: Safety/medical concerns(Pt could not tolerate sitting in WC for long enough to complete activity due to low back pain)       Wheelchair 150 feet activity     Assist  Wheelchair 150 feet activity did not occur: Safety/medical concerns(Pt could not tolerate sitting in WC for long enough to complete activity due to low back pain)       Blood pressure 132/75, pulse 96, temperature 98 F (36.7 C), temperature source Oral, resp. rate 16, height 5\' 7"  (1.702 m), weight 59.9 kg, SpO2 100 %.    Medical Problem List and Plan: 1.  Impaired gait with left side weakness secondary to right basal ganglia infarction as well as small cortical infarct left frontal lobe  Continue bedrest  CT head on 12/13 personally reviewed, showing some improvement, MRI personally reviewed, stable. Repeat MRI ordered again by ID, showing stable infarcts  Chest x-ray personally reviewed, unremarkable for infection  Discussed with neurology results of the EEG -findings possibly related to uremia +/- cefepime  Discussed with psychiatry, continue medical management  Will consult hospitalist 2.  Antithrombotics: -DVT/anticoagulation: Chronic Coumadin for right IJ acute DVT.  Plan anticoagulation x3 months  INR critical value of 5.2, discussed with pharmacy             -  antiplatelet therapy: N/A 3. Pain Management chronic back and left hip pain: Neurontin 100 mg held oxycodone as needed only received 1 dose in last 72h .    TLSO back brace for comfort 4. Mood: Provide emotional support             -antipsychotic agents: N/A 5. Neuropsych: This patient is not capable of making decisions on her own behalf. 6. Skin/Wound Care: Routine skin checks  Santyl to sacral wound 7. Fluids/Electrolytes/Nutrition: Routine in and outs, may need TF if po intake does not improve  8.  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  New HD tunneled  catheter placed 10/26/2019. 9.  ID/recurrent Pseudomonas bacteremia.    Continue Maxipime as directed with infectious disease to discuss duration of antibiotic therapies anticipate 6 weeks.  Cipro DC'd by ID due to seizure risk  Per ID, no changes in medications 10.  Hemodialysis MWF.  Follow-up per renal services.  11.  Diabetes mellitus.  Hemoglobin A1c 7.6.  SSI. CBG (last 3)  Recent Labs    11/13/19 1703 11/13/19 2114 11/14/19 0630  GLUCAP 122* 198* 176*   Labile on 12/22 12.  Hypertension.  Imdur 30 mg daily  Coreg 6.25 mg twice daily, increased to 12.5 on 12/15, parameters placed.    Monitor with increased mobility.  Controlled on 12/22 13.  Acute on chronic anemia.    Hemoglobin 9.1 on 12/21  Continue to monitor 14. Constipation:  See #16  Abd xray showing colonic distention  Improving 15.  Candidiasis  Diflucan ordered on 12/16  Repeat Ucx remains pending on 12/22 16.  Hyperammonemia with hepatic encephalopathy  Lactulose started on 12/17, encourage compliance  Ammonia WNL on 12/22 17. Sleep disturance  Melatonin added on 12/17  Sleep chart ordered 18.  Leukocytosis  WBCs 12.6 on 12/21, labs with HD  Afebrile  Continue to monitor 19.  GERD  Home Protonix ordered on 12/20   LOS: 13 days A FACE TO FACE EVALUATION WAS PERFORMED  Khadijatou Borak Lorie Phenix 11/14/2019, 11:18 AM

## 2019-11-14 NOTE — Significant Event (Signed)
CRITICAL VALUE ALERT  Critical Value:  INR 5.2  Date & Time Notied:  11/14/19 08:32  Provider Notified: Lowell Guitar  Orders Received/Actions taken: No new orders

## 2019-11-14 NOTE — Progress Notes (Signed)
Physical Therapy Session Note  Patient Details  Name: [name] MRN: [id number] Date of Birth: 08-Jul-1951  Today's Date: 11/14/2019 PT Individual Time: 1030-1114 PT Individual Time Calculation (min): 44 min   Today's Date: 11/14/2019 PT Missed Time: 16 Minutes Missed Time Reason: Patient fatigue  Short Term Goals: Week 1:  PT Short Term Goal 1 (Week 1): pt will transfer supine<>sit and sit<>supine min A PT Short Term Goal 1 - Progress (Week 1): Progressing toward goal PT Short Term Goal 2 (Week 1): Pt will transfer stand<>pivot with LRAD mod A PT Short Term Goal 2 - Progress (Week 1): Progressing toward goal PT Short Term Goal 3 (Week 1): Pt will perform car transfer with LRAD mod A PT Short Term Goal 3 - Progress (Week 1): Progressing toward goal Week 2:  PT Short Term Goal 1 (Week 2): Pt will transfer supine<>sit and sit<>supine with min A PT Short Term Goal 2 (Week 2): Pt will transfer stand<>pivot with LRAD mod A PT Short Term Goal 3 (Week 2): Pt will perform car transfer with LRAD mod A  Skilled Therapeutic Interventions/Progress Updates:   Received pt supine in bed, pt agreeable to therapy, and did not report any pain during today's session. Pt on orders for bed rest, and therapy limited to bed exercises. RN consulted and stated INR still high but slowly decreasing, pt able to participate in bed level therapy. Pt continues to remain confused stating "you came in this morning and gave me graham crackers". Pt with continued jabbering stating her sister is in the hallway and screaming out "[name]! [name]!" for her son. Pt reoriented to situation. Pt able to recall location, situation, date, and year. Pt fatigued throughout session, and required max cues to keep eyes open and participate in therapy.  In supine pt performed the following exercises: -ankle pumps x12 bilaterally -ankle circles x10 bilaterally -hip abduction x10 bilaterally -pillow squeezes 2x10 -active assisted SLR x  8 bilaterally -active assisted marching  x 8 bilaterally Pt required verbal cues throughout session to continue exercises. Pt randomly screaming out "door open!" and "I'm ready to go ne ne", multiple times throughout session. RN present to check in on patient. Therapist provided update on pt's current status. Pt reported her back hurts. Pt perfromed SKTC stretch 3x30 second hold bilaterally. Pt reported relief after stretching. Pt with decreased participation in therapy with increased fatigue. Therapist assisted with repositioning in sidelying for pressure relief. Concluded session with pt supine in bed, needs within reach, and bed alarm on. 16 minutes of missed skilled physical therapy due to pt fatigue.   Therapy Documentation Precautions:  Precautions Precautions: Fall Precaution Comments: Strong L lateral lean, TLSO for mobility Required Braces or Orthoses: Other Brace(TLSO) Spinal Brace: Thoracolumbosacral orthotic Spinal Brace Comments: for comfort with mobility Restrictions Weight Bearing Restrictions: No   Therapy/Group: Individual Therapy Alfonse Alpers PT, DPT   11/14/2019, 7:33 AM

## 2019-11-14 NOTE — Progress Notes (Signed)
Speech Language Pathology Daily Session Note  Patient Details  Name: [name] MRN: [id number] Date of Birth: [birth year]  Today's Date: 11/14/2019 SLP Individual Time: 0917-1000 SLP Individual Time Calculation (min): 43 min  Short Term Goals: Week 2: SLP Short Term Goal 1 (Week 2): Pt will demonstrate functional problem solving during basic tasks with Min A verbal/visual cues SLP Short Term Goal 2 (Week 2): Pt will demonstrate recall of day to day/new information with Min A verbal cues for use of compensatory memory strategies SLP Short Term Goal 3 (Week 2): Pt will selectively attend to functional tasks with Supervision A verbal cues for redirection SLP Short Term Goal 4 (Week 2): Pt will demonstrate anticipatory awareness by stating 2 ADLs she will need assistance with upon her return home with Min A verbal/visual cues  Skilled Therapeutic Interventions: Pt was seen for skilled ST targeting cognitive goals. Pt had just arrived back to room after MRI (missed 15 minutes skilled ST as a result) and was pleasant upon our greeting. She encouraged SLP to stay so she could participate in therapy, however Max A verbal cues provided for problem solving and sequencing to end a phone call with family member prior to beginning [facility] activities. Although independently oriented to self, place, situation, and month, she exhibited intermittent language of confusion. She also frequently stated "something is not right" and able to communicate feelings of "mental fuzziness" with Mod A question cues from clinician. Pt denied any pain. SLP further facilitated session with Mod A verbal cues for sustained attention to functional task - eating a graham cracker with peanut butter. Pt will required full supervision during meals due to cognition - decreased attention to intake impacting safety during meals and intermittently expectorated POs due to confusion. Pt left laying in bed with alarm set and all needs within  reach. Continue per current plan of care.       Pain Pain Assessment Pain Scale: Faces Faces Pain Scale: No hurt  Therapy/Group: Individual Therapy  Arbutus Leas 11/14/2019, 10:03 AM

## 2019-11-14 NOTE — Consult Note (Addendum)
Medical Consultation   Faustine Ferreri  [id number]  DOB: 12-14-1950  DOA: 11/01/2019  PCP: Manon Hilding, MD  Outpatient Specialists: PCP   Requesting physician: Dr. Posey Pronto  Reason for consultation: AMS, increase ammonia, and elevated INR.  History of Present Illness: [name] is an 68 y.o. female  68 year old right-handed female with history of end-stage renal disease with hemodialysis MWF, diet controlled diabetes mellitus, TIA 2010 maintained on aspirin and Plavix, hypertension, hyperlipidemia presented 10/20/2019 with left-sided weakness and difficulty with ambulation.  CT of the brain showed acute right basal ganglia infarction.  Patient underwent non-tPA stroke protocol.  SARS coronavirus negative.  MRI/MRA showed acute right basal ganglia infarction chronic ischemia with multiple old infarctions.  Negative head MRA.  Infectious disease work-up during fever revealed Pseudomonas by BCID, source likely HD catheter related.  Right IJ hemodialysis catheter was removed 10/23/2019 with placement of new tunneled catheter 10/26/2019 for continued hemodialysis per renal services.  TEE performed on 10/26/2019 demonstrates large vegetation of mitral valve.  CTS consulted Dr. Darcey Nora felt the best course of action was continued IV antibiotics no current plan for mitral valve repair or replacement due to risk of recurrent infection at this time and current recommendations are to continue antibiotics 6 weeks with cefepime as well as Cipro with end date to be established.  Latest follow-up blood culture showed no growth.  Hemodialysis remains ongoing as per renal services.  Noted on 10/27/2019 patient with altered mental status follow-up cranial CT scan completed that showed evidence of a new small frontal lobe white matter infarction new since MRI of 10/20/2019 near the left frontal horn.  No associated hemorrhage or mass-effect with follow-up neurology services recommending  continued plan of care with ongoing Coumadin.    On 11/06/19, patient son reported that patient had slowly been declining overnight. Neuro was re-consulted. EEG showed no significant seizure activity, and neuro recommend change of ABX from Cefepime to another ABX, but no AED indicated. ID however maintained Cefepime regimen, pt was also noticed to have borderline elevation of ammonia level, but no significant Hx of cirrhosis. Repeat MRI brain today did not show any acute findings. Review of Systems:  ROS As per HPI otherwise 10 point review of systems negative.    Past Medical History: Past Medical History:  Diagnosis Date  . Arthritis    hands  . Constipation   . Diabetes mellitus without complication (HCC)    Type II  . ESRD (end stage renal disease) ([place])     M/W/F- Hemodialysis  . GERD (gastroesophageal reflux disease)   . Headache   . Hyperlipidemia   . Hypertension   . Irregular heart rate   . Stroke [facility])    TIA - approx 2010- no residual    Past Surgical History: Past Surgical History:  Procedure Laterality Date  . ABDOMINAL HYSTERECTOMY    . AORTIC ARCH ANGIOGRAPHY N/A 03/28/2019   Procedure: AORTIC ARCH ANGIOGRAPHY;  Surgeon: Waynetta Sandy, MD;  Location: [place] [facility];  Service: Cardiovascular;  Laterality: N/A;  . AV FISTULA PLACEMENT Left 06/13/2018   Procedure: ARTERIOVENOUS (AV) FISTULA CREATION;  Surgeon: Rosetta Posner, MD;  Location: [facility] OR;  Service: Vascular;  Laterality: Left;  . AV FISTULA PLACEMENT Left 08/18/2018   Procedure: INSERTION OF 4-7MM X 45CM ARTERIOVENOUS (AV) GORE-TEX GRAFT LEFT  FOREARM;  Surgeon: Rosetta Posner, MD;  Location: [place];  Service: Vascular;  Laterality: Left;  . AV FISTULA PLACEMENT Right 04/06/2019   Procedure: INSERTION OF ARTERIOVENOUS (AV) GORE-TEX GRAFT RIGHT UPPER ARM;  Surgeon: Waynetta Sandy, MD;  Location: [place];  Service: Vascular;  Laterality: Right;  . AV FISTULA PLACEMENT Right 04/13/2019    Procedure: INSERTION OF ARTERIOVENOUS (AV) BOVINE  ARTEGRAFT GRAFT RIGHT UPPER EXTREMITY;  Surgeon: Serafina Mitchell, MD;  Location: [place];  Service: Vascular;  Laterality: Right;  . [place] REMOVAL Right 05/16/2019   Procedure: REMOVAL OF ARTERIOVENOUS GORETEX GRAFT ([place]) RIGHT ARM;  Surgeon: Angelia Mould, MD;  Location: [place];  Service: Vascular;  Laterality: Right;  . BASCILIC VEIN TRANSPOSITION Right 03/07/2019   Procedure: First Stage Bascilic Vein Transposition Right Arm;  Surgeon: Serafina Mitchell, MD;  Location: [place];  Service: Vascular;  Laterality: Right;  . CATARACT EXTRACTION Right 2005  . INSERTION OF DIALYSIS CATHETER N/A 08/18/2018   Procedure: INSERTION OF DIALYSIS CATHETER;  Surgeon: Rosetta Posner, MD;  Location: [place];  Service: Vascular;  Laterality: N/A;  . INSERTION OF DIALYSIS CATHETER Right 07/25/2019   Procedure: INSERTION OF DIALYSIS CATHETER RIGHT INTERNAL JUGULAR (TUNNELED);  Surgeon: Virl Cagey, MD;  Location: [facility];  Service: General;  Laterality: Right;  . IR FLUORO GUIDE CV LINE LEFT  10/26/2019  . IR FLUORO GUIDE CV LINE RIGHT  12/06/2018  . IR PTA ADDL CENTRAL DIALYSIS SEG THRU DIALY CIRCUIT RIGHT Right 12/06/2018  . IR REMOVAL TUN CV CATH W/O FL  10/23/2019  . IR US GUIDE VASC ACCESS LEFT  10/26/2019  . TEE WITHOUT CARDIOVERSION N/A 10/26/2019   Procedure: TRANSESOPHAGEAL ECHOCARDIOGRAM (TEE);  Surgeon: Lelon Perla, MD;  Location: [place];  Service: Cardiovascular;  Laterality: N/A;  . THROMBECTOMY AND REVISION OF ARTERIOVENTOUS (AV) GORETEX  GRAFT Left 09/29/2018   Procedure: INSERTION OF ARTERIOVENTOUS (AV) GORETEX  GRAFT ARM;  Surgeon: Waynetta Sandy, MD;  Location: [place];  Service: Vascular;  Laterality: Left;  . UPPER EXTREMITY ANGIOGRAPHY Right 03/28/2019   Procedure: UPPER EXTREMITY ANGIOGRAPHY;  Surgeon: Waynetta Sandy, MD;  Location: [place] [facility];  Service: Cardiovascular;  Laterality: Right;  . VENOGRAM   10/27/2018   Procedure: Venogram;  Surgeon: Marty Heck, MD;  Location: [place] [facility];  Service: Cardiovascular;;  bilateral arm     Allergies:  No Known Allergies   Social History:  reports that she has never smoked. She has never used smokeless tobacco. She reports that she does not drink alcohol or use drugs.   Family History: Family History  Problem Relation Age of Onset  . Heart murmur Mother   . Heart failure Mother   . Diabetes Mother   . Cirrhosis Father   . Alcoholism Father       Physical Exam: Vitals:   11/13/19 1600 11/13/19 1613 11/14/19 0326 11/14/19 1353  BP: 106/61 109/64 132/75 (!) 90/57  Pulse: 100 99 96 84  Resp:  (!) 22 16 16   Temp:  97.6 F (36.4 C) 98 F (36.7 C) 98.6 F (37 C)  TempSrc:  Oral Oral Oral  SpO2:  95% 100% 100%  Weight:  59.6 kg 59.9 kg   Height:        Constitutional: Chronically ill appearance,  Alert and awake, oriented x3, not in any acute distress. Eyes: PERLA, EOMI, irises appear normal, anicteric sclera,  ENMT: external ears and nose appear normal, Lips appears normal, oropharynx mucosa, tongue, posterior pharynx appear normal  Neck: neck appears normal,  no masses, normal ROM, no thyromegaly, no JVD  CVS: S1-S2 clear, no murmur rubs or gallops, no LE edema, normal pedal pulses  Respiratory:  clear to auscultation bilaterally, no wheezing, rales or rhonchi. Respiratory effort normal. No accessory muscle use.  Abdomen: soft nontender, nondistended, normal bowel sounds, no hepatosplenomegaly, no hernias  Musculoskeletal: : no cyanosis, clubbing or edema noted bilaterally                        Neuro: Cranial nerves II-XII intact, strength, sensation, reflexes Psych: judgement and insight appear normal, stable mood and affect, mental status Skin: no rashes or lesions or ulcers, no induration or nodules    Data reviewed:  I have personally reviewed following labs and imaging studies Labs:  CBC: Recent Labs    Lab 11/08/19 0510 11/09/19 0731 11/10/19 1405 11/10/19 1759 11/13/19 1134  WBC 7.8 9.2 10.0 12.2* 12.6*  NEUTROABS  --  6.7  --   --   --   HGB 8.8* 10.3* 9.4* 10.8* 9.1*  HCT 27.9* 32.1* 30.1* 34.0* 28.4*  MCV 92.4 92.2 94.4 91.9 91.0  PLT 211 272 215 209 [id number]    Basic Metabolic Panel: Recent Labs  Lab 11/08/19 1254 11/10/19 1405 11/10/19 1759 11/13/19 1248  NA 128* 136 134* 131*  K 4.7 3.9 3.3* 4.4  CL 90* 95* 93* 94*  CO2 24 26 27 22   GLUCOSE 200* 168* 173* 198*  BUN 38* 33* 9 57*  CREATININE 7.32* 7.14* 3.17* 8.41*  CALCIUM 8.8* 8.7* 8.5* 8.3*  PHOS 4.7* 3.5 2.2* 5.2*   GFR Estimated Creatinine Clearance: 6 mL/min (A) (by C-G formula based on SCr of 8.41 mg/dL (H)). Liver Function Tests: Recent Labs  Lab 11/08/19 1254 11/10/19 1405 11/10/19 1759 11/13/19 1248  ALBUMIN 2.2* 2.2* 2.4* 1.7*   No results for input(s): LIPASE, AMYLASE in the last 168 hours. Recent Labs  Lab 11/09/19 0731 11/14/19 0522  AMMONIA 40* 25   Coagulation profile Recent Labs  Lab 11/10/19 0639 11/11/19 0608 11/12/19 0822 11/13/19 1134 11/14/19 0522  INR 2.4* 2.9* 4.8* 5.3* 5.2*    Cardiac Enzymes: No results for input(s): CKTOTAL, CKMB, CKMBINDEX, TROPONINI in the last 168 hours. BNP: Invalid input(s): POCBNP CBG: Recent Labs  Lab 11/13/19 1703 11/13/19 2114 11/14/19 0630 11/14/19 1156 11/14/19 1655  GLUCAP 122* 198* 176* 244* 130*   D-Dimer No results for input(s): DDIMER in the last 72 hours. Hgb A1c No results for input(s): HGBA1C in the last 72 hours. Lipid Profile No results for input(s): CHOL, HDL, LDLCALC, TRIG, CHOLHDL, LDLDIRECT in the last 72 hours. Thyroid function studies No results for input(s): TSH, T4TOTAL, T3FREE, THYROIDAB in the last 72 hours.  Invalid input(s): FREET3 Anemia work up No results for input(s): VITAMINB12, FOLATE, FERRITIN, TIBC, IRON, RETICCTPCT in the last 72 hours. Urinalysis    Component Value Date/Time   COLORURINE  YELLOW 11/06/2019 1030   APPEARANCEUR CLOUDY (A) 11/06/2019 1030   LABSPEC 1.018 11/06/2019 1030   PHURINE 5.0 11/06/2019 1030   GLUCOSEU 50 (A) 11/06/2019 1030   HGBUR NEGATIVE 11/06/2019 [place] 11/06/2019 1030   [place] 11/06/2019 1030   PROTEINUR 100 (A) 11/06/2019 1030   NITRITE NEGATIVE 11/06/2019 1030   LEUKOCYTESUR SMALL (A) 11/06/2019 1030     Microbiology Recent Results (from the past 240 hour(s))  Culture, Urine     Status: Abnormal   Collection Time: 11/06/19 11:07 AM   Specimen: Urine, Random  Result Value Ref Range Status   Specimen Description URINE, RANDOM  Final   Special Requests   Final    NONE Performed at [facility], [address], [place], [place] [address]    Culture 80,000 COLONIES/mL YEAST (A)  Final   Report Status 11/07/2019 FINAL  Final       Inpatient Medications:   Scheduled Meds: . acetaminophen  500 mg Oral TID  . calcium carbonate  1 tablet Oral 2 times per day  . carvedilol  12.5 mg Oral BID  . Chlorhexidine Gluconate Cloth  6 each Topical Q0600  . darbepoetin (ARANESP) injection - DIALYSIS  60 mcg Intravenous Q Mon-HD  . doxercalciferol  1.5 mcg Intravenous Q M,W,F-HD  . feeding supplement (ENSURE ENLIVE)  237 mL Oral BID BM  . insulin aspart  0-9 Units Subcutaneous TID WC  . isosorbide mononitrate  30 mg Oral QPC lunch  . lactulose  20 g Oral TID  . Melatonin  3 mg Oral QHS  . multivitamin  1 tablet Oral QPC lunch  . pantoprazole  40 mg Oral Daily  . QUEtiapine  12.5 mg Oral QHS  . Warfarin - Pharmacist Dosing Inpatient   Does not apply q1800   Continuous Infusions: . sodium chloride    . sodium chloride    . ceFEPime (MAXIPIME) IV Stopped (11/13/19 2130)     Radiological Exams on Admission: DG Abd 1 View  Result Date: 11/14/2019 CLINICAL DATA:  Abdominal pain EXAM: ABDOMEN - 1 VIEW COMPARISON:  None. FINDINGS: Diffusely gas distended colon. No dilated small bowel is visible. No  abnormal calcification. IMPRESSION: Diffusely gas distended colon. Electronically Signed   By: Ulyses Jarred M.D.   On: 11/14/2019 03:52   MR BRAIN WO CONTRAST  Result Date: 11/14/2019 CLINICAL DATA:  Septic arterial embolism. Native endocarditis of the mitral valve with concern for CNS disease. EXAM: MRI HEAD WITHOUT CONTRAST TECHNIQUE: Multiplanar, multiecho pulse sequences of the brain and surrounding structures were obtained without intravenous contrast. COMPARISON:  Eight days ago FINDINGS: Brain: Subacute to chronic infarcts in the bilateral corona radiata and centrum semiovale. Chronic infarct in the right caudate head. Chronic small left inferior cerebellar infarcts. No adverse features or liquefaction. No acute or interval infarct. No hemorrhage, hydrocephalus, or collection. Vascular: Normal flow voids Skull and upper cervical spine: Generally hypointense marrow signal which could be from patient's end-stage renal disease or anemia Sinuses/Orbits: Negative IMPRESSION: Stable non-acute infarcts.  No evidence of abscess or acute process. Electronically Signed   By: Monte Fantasia M.D.   On: 11/14/2019 09:23    Impression/Recommendations Principal Problem:   Cerebrovascular accident (CVA) of right basal ganglia (HCC) Active Problems:   Slow transit constipation   Acute on chronic anemia   Labile blood pressure   Labile blood glucose   Diabetes mellitus type 2 in nonobese (HCC)   Bacteremia   Endocarditis of mitral valve   Septic embolism (HCC)   Seizure (HCC)   Candidiasis   Encephalopathy, hepatic (HCC)   Hyperammonemia (HCC)   Sleep disturbance   Hemodialysis-associated hypotension   Supratherapeutic INR   Gastroesophageal reflux disease   Decubitus ulcer of sacral region, unstageable ([place])  Encephalopathy versus delirium, discussed with patient psychiatrist and nurse and patient's son at bedside, impression was patient most occasions has episode of confusion and agitation  toward the end of the day, patient son also reported that patient has disturbed sleep cycles in the last 3 to 5 days, and she  did not sleep at all last night, and today she is very drowsy and moody.  According to patient's son patient has no history of dementia or sundowning at home, suspect mental status changes related to prolonged hospital stay and disturbed sleep-wake cycles, small dose of Seroquel at bedtime, EKG today and repeat EKG tomorrow, no history of QTC prolongation on last EKG earlier this month.  IIDM, fairly controlled despite episodes of over 200 readings, avoid aggressive control of sugar level in this patient with ESRD.  Hyper ammonium, etiology unknown, on lactulose, and the level has come down.  Coumadin induced coagulopathy, no s/s of active bleeding, hold Coumadin and recheck INR.  Endocarditis, as per ID.  CVA of right basal ganglia, likely from septic emboli from MV vegetation, on Coumadin, was discharged on both ASA and coumadin, for long term I do believe coumadin sole therapy is reasonable. Check lipid panel. ID recommend Cefepime for better CNS penetration according to his note.  Stage II sacral ulcer, wound care follows, stable.  Case was discussed with pt's son and pt and pt's nurse and attending.    Time Spent: 39  Lequita Halt M.D. [facility] 11/14/2019, 5:16 PM

## 2019-11-14 NOTE — Progress Notes (Signed)
Subjective:  Patient is complaining that his son has been gone for a while since earlier this morning when she went for MRI scan  Antibiotics:  Anti-infectives (From admission, onward)   Start     Dose/Rate Route Frequency Ordered Stop   11/08/19 1400  fluconazole (DIFLUCAN) tablet 100 mg  Status:  Discontinued     100 mg Oral Every 24 hours 11/08/19 1059 11/09/19 1101   11/01/19 2000  ceFEPIme (MAXIPIME) 1 g in sodium chloride 0.9 % 100 mL IVPB     1 g 200 mL/hr over 30 Minutes Intravenous Every 24 hours 11/01/19 1715     11/01/19 1800  ciprofloxacin (CIPRO) tablet 500 mg  Status:  Discontinued     500 mg Oral Daily-1800 11/01/19 1715 11/07/19 0914      Medications: Scheduled Meds: . acetaminophen  500 mg Oral TID  . calcium carbonate  1 tablet Oral 2 times per day  . carvedilol  12.5 mg Oral BID  . Chlorhexidine Gluconate Cloth  6 each Topical Q0600  . darbepoetin (ARANESP) injection - DIALYSIS  60 mcg Intravenous Q Mon-HD  . doxercalciferol  1.5 mcg Intravenous Q M,W,F-HD  . feeding supplement (ENSURE ENLIVE)  237 mL Oral BID BM  . insulin aspart  0-9 Units Subcutaneous TID WC  . isosorbide mononitrate  30 mg Oral QPC lunch  . lactulose  20 g Oral TID  . Melatonin  3 mg Oral QHS  . multivitamin  1 tablet Oral QPC lunch  . pantoprazole  40 mg Oral Daily  . Warfarin - Pharmacist Dosing Inpatient   Does not apply q1800   Continuous Infusions: . sodium chloride    . sodium chloride    . ceFEPime (MAXIPIME) IV Stopped (11/13/19 2130)   PRN Meds:.sodium chloride, sodium chloride, acetaminophen **OR** acetaminophen (TYLENOL) oral liquid 160 mg/5 mL **OR** acetaminophen, fluticasone, meclizine, ondansetron, oxyCODONE, sorbitol    Objective: Weight change:   Intake/Output Summary (Last 24 hours) at 11/14/2019 1239 Last data filed at 11/14/2019 0717 Gross per 24 hour  Intake 282 ml  Output 1000 ml  Net -718 ml   Blood pressure 132/75, pulse 96, temperature  98 F (36.7 C), temperature source Oral, resp. rate 16, height 5\' 7"  (1.702 m), weight 59.9 kg, SpO2 100 %. Temp:  [97.6 F (36.4 C)-98.8 F (37.1 C)] 98 F (36.7 C) (12/22 0326) Pulse Rate:  [84-100] 96 (12/22 0326) Resp:  [16-22] 16 (12/22 0326) BP: (96-132)/(58-75) 132/75 (12/22 0326) SpO2:  [95 %-100 %] 100 % (12/22 0326) Weight:  [59.6 kg-60.4 kg] 59.9 kg (12/22 0326)  Physical Exam: General: Alert and awake much more lucid and conversant HEENT: anicteric sclera, EOMI CVS regular rate, normal no murmurs gallops or rubs heard, HD catheter in place Chest: , no wheezing, no respiratory distress Abdomen: soft non-distended,  Extremities: no edema or deformity noted bilaterally Skin: no rashes Neuro: Hemiplegia  CBC:    BMET Recent Labs    11/13/19 1248  NA 131*  K 4.4  CL 94*  CO2 22  GLUCOSE 198*  BUN 57*  CREATININE 8.41*  CALCIUM 8.3*     Liver Panel  Recent Labs    11/13/19 1248  ALBUMIN 1.7*       Sedimentation Rate No results for input(s): ESRSEDRATE in the last 72 hours. C-Reactive Protein No results for input(s): CRP in the last 72 hours.  Micro Results: Recent Results (from the past 720 hour(s))  SARS CORONAVIRUS 2 (  TAT 6-24 HRS) Nasopharyngeal Nasopharyngeal Swab     Status: None   Collection Time: 10/20/19  7:30 AM   Specimen: Nasopharyngeal Swab  Result Value Ref Range Status   SARS Coronavirus 2 NEGATIVE NEGATIVE Final    Comment: (NOTE) SARS-CoV-2 target nucleic acids are NOT DETECTED. The SARS-CoV-2 RNA is generally detectable in upper and lower respiratory specimens during the acute phase of infection. Negative results do not preclude SARS-CoV-2 infection, do not rule out co-infections with other pathogens, and should not be used as the sole basis for treatment or other patient management decisions. Negative results must be combined with clinical observations, patient history, and epidemiological information. The  expected result is Negative. Fact Sheet for Patients: SugarRoll.be Fact Sheet for Healthcare Providers: https://www.woods-mathews.com/ This test is not yet approved or cleared by the [place] FDA and  has been authorized for detection and/or diagnosis of SARS-CoV-2 by FDA under an Emergency Use Authorization (EUA). This EUA will remain  in effect (meaning this test can be used) for the duration of the COVID-19 declaration under Section 56 4(b)(1) of the Act, 21 U.S.C. section 360bbb-3(b)(1), unless the authorization is terminated or revoked sooner. Performed at [facility], [place] [address], [place], [place] [address]   Culture, blood (routine x 2)     Status: Abnormal   Collection Time: 10/22/19  7:35 AM   Specimen: BLOOD  Result Value Ref Range Status   Specimen Description BLOOD RIGHT ANTECUBITAL  Final   Special Requests   Final    BOTTLES DRAWN AEROBIC ONLY Blood Culture adequate volume   Culture  Setup Time   Final    GRAM NEGATIVE RODS AEROBIC BOTTLE ONLY CRITICAL VALUE NOTED.  VALUE IS CONSISTENT WITH PREVIOUSLY REPORTED AND CALLED VALUE.    Culture (A)  Final    PSEUDOMONAS AERUGINOSA SUSCEPTIBILITIES PERFORMED ON PREVIOUS CULTURE WITHIN THE LAST 5 DAYS. Performed at [facility], [place] [address], [place], [place] [address]    Report Status 10/25/2019 FINAL  Final  Culture, blood (routine x 2)     Status: Abnormal   Collection Time: 10/22/19  7:43 AM   Specimen: BLOOD LEFT HAND  Result Value Ref Range Status   Specimen Description BLOOD LEFT HAND  Final   Special Requests   Final    BOTTLES DRAWN AEROBIC ONLY Blood Culture adequate volume   Culture  Setup Time   Final    GRAM NEGATIVE RODS AEROBIC BOTTLE ONLY CRITICAL RESULT CALLED TO, READ BACK BY AND VERIFIED WITH: PHARMD V BRYK [id number] AT 725 AM BY CM Performed at [facility], [place] [address], [place], [place] [address]    Culture PSEUDOMONAS  AERUGINOSA (A)  Final   Report Status 10/25/2019 FINAL  Final   Organism ID, Bacteria PSEUDOMONAS AERUGINOSA  Final      Susceptibility   Pseudomonas aeruginosa - MIC*    CEFTAZIDIME 2 SENSITIVE Sensitive     CIPROFLOXACIN <=0.25 SENSITIVE Sensitive     GENTAMICIN <=1 SENSITIVE Sensitive     IMIPENEM 1 SENSITIVE Sensitive     PIP/TAZO <=4 SENSITIVE Sensitive     CEFEPIME <=1 SENSITIVE Sensitive     * PSEUDOMONAS AERUGINOSA  Blood Culture ID Panel (Reflexed)     Status: Abnormal   Collection Time: 10/22/19  7:43 AM  Result Value Ref Range Status   Enterococcus species NOT DETECTED NOT DETECTED Final   Listeria monocytogenes NOT DETECTED NOT DETECTED Final   Staphylococcus species NOT DETECTED NOT  DETECTED Final   Staphylococcus aureus (BCID) NOT DETECTED NOT DETECTED Final   Streptococcus species NOT DETECTED NOT DETECTED Final   Streptococcus agalactiae NOT DETECTED NOT DETECTED Final   Streptococcus pneumoniae NOT DETECTED NOT DETECTED Final   Streptococcus pyogenes NOT DETECTED NOT DETECTED Final   Acinetobacter baumannii NOT DETECTED NOT DETECTED Final   Enterobacteriaceae species NOT DETECTED NOT DETECTED Final   Enterobacter cloacae complex NOT DETECTED NOT DETECTED Final   Escherichia coli NOT DETECTED NOT DETECTED Final   Klebsiella oxytoca NOT DETECTED NOT DETECTED Final   Klebsiella pneumoniae NOT DETECTED NOT DETECTED Final   Proteus species NOT DETECTED NOT DETECTED Final   Serratia marcescens NOT DETECTED NOT DETECTED Final   Carbapenem resistance NOT DETECTED NOT DETECTED Final   Haemophilus influenzae NOT DETECTED NOT DETECTED Final   Neisseria meningitidis NOT DETECTED NOT DETECTED Final   Pseudomonas aeruginosa DETECTED (A) NOT DETECTED Final    Comment: CRITICAL RESULT CALLED TO, READ BACK BY AND VERIFIED WITH: PHARMD V BRYK [id number] AT 725 AM BY CM    Candida albicans NOT DETECTED NOT DETECTED Final   Candida glabrata NOT DETECTED NOT DETECTED Final   Candida  krusei NOT DETECTED NOT DETECTED Final   Candida parapsilosis NOT DETECTED NOT DETECTED Final   Candida tropicalis NOT DETECTED NOT DETECTED Final    Comment: Performed at [facility], [place]. [address] [place], [place] [address]  Culture, blood (routine x 2)     Status: Abnormal   Collection Time: 10/22/19  5:03 PM   Specimen: BLOOD LEFT HAND  Result Value Ref Range Status   Specimen Description BLOOD LEFT HAND  Final   Special Requests   Final    BOTTLES DRAWN AEROBIC AND ANAEROBIC Blood Culture adequate volume   Culture  Setup Time   Final    AEROBIC BOTTLE ONLY GRAM NEGATIVE RODS CRITICAL VALUE NOTED.  VALUE IS CONSISTENT WITH PREVIOUSLY REPORTED AND CALLED VALUE.    Culture (A)  Final    PSEUDOMONAS AERUGINOSA SUSCEPTIBILITIES PERFORMED ON PREVIOUS CULTURE WITHIN THE LAST 5 DAYS. Performed at [facility], [place] [address], [place], [place] [address]    Report Status 10/25/2019 FINAL  Final  Culture, blood (routine x 2)     Status: Abnormal   Collection Time: 10/22/19  5:08 PM   Specimen: BLOOD LEFT ARM  Result Value Ref Range Status   Specimen Description BLOOD LEFT ARM  Final   Special Requests   Final    BOTTLES DRAWN AEROBIC ONLY Blood Culture results may not be optimal due to an inadequate volume of blood received in culture bottles   Culture  Setup Time   Final    AEROBIC BOTTLE ONLY GRAM NEGATIVE RODS CRITICAL VALUE NOTED.  VALUE IS CONSISTENT WITH PREVIOUSLY REPORTED AND CALLED VALUE.    Culture (A)  Final    PSEUDOMONAS AERUGINOSA SUSCEPTIBILITIES PERFORMED ON PREVIOUS CULTURE WITHIN THE LAST 5 DAYS. Performed at [facility], [place] [address] [place], [place] [address]    Report Status 10/25/2019 FINAL  Final  Culture, blood (routine x 2)     Status: None   Collection Time: 10/24/19  1:18 PM   Specimen: BLOOD LEFT HAND  Result Value Ref Range Status   Specimen Description BLOOD LEFT HAND  Final   Special Requests   Final    BOTTLES DRAWN  AEROBIC ONLY Blood Culture adequate volume   Culture   Final    NO GROWTH 5 DAYS  Performed at [facility], [place] [address], [place], [place] [address]    Report Status 10/29/2019 FINAL  Final  Culture, blood (routine x 2)     Status: None   Collection Time: 10/24/19  1:28 PM   Specimen: BLOOD RIGHT HAND  Result Value Ref Range Status   Specimen Description BLOOD RIGHT HAND  Final   Special Requests   Final    BOTTLES DRAWN AEROBIC AND ANAEROBIC Blood Culture results may not be optimal due to an excessive volume of blood received in culture bottles   Culture   Final    NO GROWTH 5 DAYS Performed at [facility], [place] [address], [place], [place] [address]    Report Status 10/29/2019 FINAL  Final  Culture, blood (routine x 2)     Status: None   Collection Time: 10/29/19  9:25 PM   Specimen: BLOOD  Result Value Ref Range Status   Specimen Description BLOOD LEFT HAND  Final   Special Requests   Final    BOTTLES DRAWN AEROBIC ONLY Blood Culture results may not be optimal due to an inadequate volume of blood received in culture bottles   Culture   Final    NO GROWTH 5 DAYS Performed at [facility], [place] [address], [place], [place] [address]    Report Status 11/03/2019 FINAL  Final  Culture, blood (routine x 2)     Status: None   Collection Time: 10/29/19  9:27 PM   Specimen: BLOOD  Result Value Ref Range Status   Specimen Description BLOOD RIGHT HAND  Final   Special Requests   Final    BOTTLES DRAWN AEROBIC ONLY Blood Culture adequate volume   Culture   Final    NO GROWTH 5 DAYS Performed at [facility], [place] [address] [place], [place] [address]    Report Status 11/03/2019 FINAL  Final  Culture, Urine     Status: Abnormal   Collection Time: 11/06/19 11:07 AM   Specimen: Urine, Random  Result Value Ref Range Status   Specimen Description URINE, RANDOM  Final   Special Requests   Final    NONE Performed at [facility], [place] [address],  [place], [place] [address]    Culture 80,000 COLONIES/mL YEAST (A)  Final   Report Status 11/07/2019 FINAL  Final    Studies/Results: DG Abd 1 View  Result Date: 11/14/2019 CLINICAL DATA:  Abdominal pain EXAM: ABDOMEN - 1 VIEW COMPARISON:  None. FINDINGS: Diffusely gas distended colon. No dilated small bowel is visible. No abnormal calcification. IMPRESSION: Diffusely gas distended colon. Electronically Signed   By: Ulyses Jarred M.D.   On: 11/14/2019 03:52   MR BRAIN WO CONTRAST  Result Date: 11/14/2019 CLINICAL DATA:  Septic arterial embolism. Native endocarditis of the mitral valve with concern for CNS disease. EXAM: MRI HEAD WITHOUT CONTRAST TECHNIQUE: Multiplanar, multiecho pulse sequences of the brain and surrounding structures were obtained without intravenous contrast. COMPARISON:  Eight days ago FINDINGS: Brain: Subacute to chronic infarcts in the bilateral corona radiata and centrum semiovale. Chronic infarct in the right caudate head. Chronic small left inferior cerebellar infarcts. No adverse features or liquefaction. No acute or interval infarct. No hemorrhage, hydrocephalus, or collection. Vascular: Normal Romero voids Skull and upper cervical spine: Generally hypointense marrow signal which could be from patient's end-stage renal disease or anemia Sinuses/Orbits: Negative IMPRESSION: Stable non-acute infarcts.  No evidence of abscess or acute process. Electronically Signed  By: Monte Fantasia M.D.   On: 11/14/2019 09:23      Assessment/Plan:  INTERVAL HISTORY: MRI is reviewed and shows no evidence of new infarcts or brain abscess  Principal Problem:   Cerebrovascular accident (CVA) of right basal ganglia (HCC) Active Problems:   Slow transit constipation   Acute on chronic anemia   Labile blood pressure   Labile blood glucose   Diabetes mellitus type 2 in nonobese (HCC)   Bacteremia   Endocarditis of mitral valve   Septic embolism (HCC)   Seizure (HCC)   Candidiasis    Encephalopathy, hepatic (HCC)   Hyperammonemia (HCC)   Sleep disturbance   Hemodialysis-associated hypotension   Supratherapeutic INR   Gastroesophageal reflux disease   Decubitus ulcer of sacral region, unstageable ([place])    [name] is a 68 y.o. female with  PsA native MV endocarditis but also concern that she has left sided disease and CNS septic emboli. She was on cefepime alone to which ciprofloxacin was added.  She has now had a seizure.  I  discontinued her ciprofloxacin but I have NEEDED continue her on cefepime to optimize CNS penetration and to complete her therapy for pseudomonal endocarditis.  Repeat MRI done this morning shows no evidence of new embolic events to the central nervous system or infectious disease progression.  She should complete her antimicrobials as planned and see Dr. Baxter Flattery on 13 January  We will sign off for now please call with further questions.      LOS: 13 days   Alcide Evener 11/14/2019, 12:39 PM

## 2019-11-14 NOTE — Progress Notes (Signed)
Pt remains nauseated with episodes of vomiting. Order for IV zofran obtained and given. Pt complaint of mid upper epigastric pain. Pt is passing large amounts of flatus. Abdomen is slightly distended but pliable. Bedside KUB done per order. Pt is afebrile with VSS. Pt has not slept is currently on phone with niece to help "calm her down". Son remains with patient. Will continue to monitor.

## 2019-11-15 ENCOUNTER — Inpatient Hospital Stay (HOSPITAL_COMMUNITY): Payer: Medicare PPO | Admitting: Speech Pathology

## 2019-11-15 ENCOUNTER — Inpatient Hospital Stay (HOSPITAL_COMMUNITY): Payer: Medicare PPO

## 2019-11-15 DIAGNOSIS — R1011 Right upper quadrant pain: Secondary | ICD-10-CM

## 2019-11-15 LAB — CBC
HCT: 26.3 % — ABNORMAL LOW (ref 36.0–46.0)
Hemoglobin: 8.5 g/dL — ABNORMAL LOW (ref 12.0–15.0)
MCH: 28.7 pg (ref 26.0–34.0)
MCHC: 32.3 g/dL (ref 30.0–36.0)
MCV: 88.9 fL (ref 80.0–100.0)
Platelets: 241 10*3/uL (ref 150–400)
RBC: 2.96 MIL/uL — ABNORMAL LOW (ref 3.87–5.11)
RDW: 17.2 % — ABNORMAL HIGH (ref 11.5–15.5)
WBC: 12.6 10*3/uL — ABNORMAL HIGH (ref 4.0–10.5)
nRBC: 0 % (ref 0.0–0.2)

## 2019-11-15 LAB — LIPID PANEL
Cholesterol: 120 mg/dL (ref 0–200)
HDL: 28 mg/dL — ABNORMAL LOW (ref >40)
LDL Cholesterol: 57 mg/dL (ref 0–99)
Total CHOL/HDL Ratio: 4.3 RATIO
Triglycerides: 177 mg/dL — ABNORMAL HIGH (ref <150)
VLDL: 35 mg/dL (ref 0–40)

## 2019-11-15 LAB — COMPREHENSIVE METABOLIC PANEL
ALT: 57 U/L — ABNORMAL HIGH (ref 0–44)
AST: 20 U/L (ref 15–41)
Albumin: 2 g/dL — ABNORMAL LOW (ref 3.5–5.0)
Alkaline Phosphatase: 111 U/L (ref 38–126)
Anion gap: 17 — ABNORMAL HIGH (ref 5–15)
BUN: 35 mg/dL — ABNORMAL HIGH (ref 8–23)
CO2: 22 mmol/L (ref 22–32)
Calcium: 8.6 mg/dL — ABNORMAL LOW (ref 8.9–10.3)
Chloride: 96 mmol/L — ABNORMAL LOW (ref 98–111)
Creatinine, Ser: 6.38 mg/dL — ABNORMAL HIGH (ref 0.44–1.00)
GFR calc Af Amer: 7 mL/min — ABNORMAL LOW (ref >60)
GFR calc non Af Amer: 6 mL/min — ABNORMAL LOW (ref >60)
Glucose, Bld: 138 mg/dL — ABNORMAL HIGH (ref 70–99)
Potassium: 3.8 mmol/L (ref 3.5–5.1)
Sodium: 135 mmol/L (ref 135–145)
Total Bilirubin: 0.4 mg/dL (ref 0.3–1.2)
Total Protein: 6.6 g/dL (ref 6.5–8.1)

## 2019-11-15 LAB — PROTIME-INR
INR: 5.6 (ref 0.8–1.2)
Prothrombin Time: 50.5 seconds — ABNORMAL HIGH (ref 11.4–15.2)

## 2019-11-15 LAB — GLUCOSE, CAPILLARY
Glucose-Capillary: 147 mg/dL — ABNORMAL HIGH (ref 70–99)
Glucose-Capillary: 120 mg/dL — ABNORMAL HIGH (ref 70–99)
Glucose-Capillary: 108 mg/dL — ABNORMAL HIGH (ref 70–99)
Glucose-Capillary: 129 mg/dL — ABNORMAL HIGH (ref 70–99)

## 2019-11-15 LAB — AMMONIA: Ammonia: 33 umol/L (ref 9–35)

## 2019-11-15 MED ORDER — HEPARIN SODIUM (PORCINE) 1000 UNIT/ML IJ SOLN
INTRAMUSCULAR | Status: AC
  Filled 2019-11-15: qty 4

## 2019-11-15 NOTE — Progress Notes (Signed)
Physical Therapy Note  Patient Details  Name: [name] MRN: [id number] Date of Birth: 1951-03-22 Today's Date: 11/15/2019  Team decision during conference today to adjust pt's plan of care to 15/7 due to increased fatigue, confusion, and unwillingness to participate in PT, OT, and SLP.     [place] PT, DPT   11/15/2019, 12:16 PM

## 2019-11-15 NOTE — Procedures (Signed)
Patient was seen on dialysis and the procedure was supervised.  BFR 350  Via TDC BP is  100/61.   Patient appears to be tolerating treatment well  Louis Meckel 11/15/2019

## 2019-11-15 NOTE — Progress Notes (Signed)
ANTICOAGULATION CONSULT NOTE - Follow-Up  Pharmacy Consult for Warfarin Indication: RIJ DVT (10/21/19), CVA (10/20/19)  No Known Allergies  Patient Measurements: Height: 5\' 7"  (170.2 cm) Weight: 132 lb 0.9 oz (59.9 kg) IBW/kg (Calculated) : 61.6  Vital Signs: Temp: 98.6 F (37 C) (12/23 0620) BP: 151/75 (12/23 0620) Pulse Rate: 92 (12/23 0620)  Labs: Recent Labs    11/13/19 1134 11/13/19 1248 11/14/19 0522 11/15/19 0718  HGB 9.1*  --   --   --   HCT 28.4*  --   --   --   PLT 199  --   --   --   LABPROT 48.5*  --  47.6* 50.5*  INR 5.3*  --  5.2* 5.6*  CREATININE  --  8.41*  --   --     Estimated Creatinine Clearance: 6 mL/min (A) (by C-G formula based on SCr of 8.41 mg/dL (H)).  Assessment: 68 yr old female with ESRD who presented on 10/20/19 with new CVA and RIJ DVT 10/21/19. Pharmacy consulted to dose Warfarin.   INR continues remains elevated at 5.6 despite holding warfarin since 12/19. Discussed with MD, no active bleeding, high clotting risk and low bleeding risk overall. Patient has poor appetite but is drinking some Ensure, which has small amounts (61mcg) of vitamin K. Discussed that the high INR is likely due to vitamin K deficiency from  poor PO intake. Given her clot much outweighs bleed risk, would prefer to not give vitamin K if possible.  Goal of Therapy:  INR 2-3 Monitor platelets by anticoagulation protocol: Yes   Plan:  - HOLD warfarin x 1 day given supratherapeutic INR -Team encouraged pt to eat vegetables today -Consider vitamin K 0.5mg  PO x1 on 12/24 if INR still uptrending - Daily PT/INR, CBC Q Mon - Will continue to monitor for any signs/symptoms of bleeding and will follow up with PT/INR in the a.m.  Benetta Spar, PharmD, BCPS, BCCP Clinical Pharmacist  Please check AMION for all [place] phone numbers After 10:00 PM, call [place] [phone]

## 2019-11-15 NOTE — Patient Care Conference (Signed)
Inpatient RehabilitationTeam Conference and Plan of Care Update Date: 11/15/2019   Time: 11:40 AM   Patient Name: [name]      Medical Record Number: [id number]  Date of Birth: 12-Oct-1951 Sex: Female         Room/Bed: 4W12C/4W12C-01 Payor Info: Payor: [employer] MEDICARE / Plan: [employer] MEDICARE CHOICE PPO / Product Type: *No Product type* /    Admit Date/Time:  11/01/2019  5:11 PM  Primary Diagnosis:  Cerebrovascular accident (CVA) of right basal ganglia [facility])  Patient Active Problem List   Diagnosis Date Noted  . Decubitus ulcer of sacral region, unstageable ([place])   . Hemodialysis-associated hypotension   . Supratherapeutic INR   . Gastroesophageal reflux disease   . Encephalopathy, hepatic ([place])   . Hyperammonemia ([place])   . Sleep disturbance   . Candidiasis   . Endocarditis of mitral valve   . Septic embolism ([place])   . Seizure ([place])   . Slow transit constipation   . Acute on chronic anemia   . Labile blood pressure   . Labile blood glucose   . Diabetes mellitus type 2 in nonobese (HCC)   . Bacteremia   . Cerebrovascular accident (CVA) of right basal ganglia ([place]) 11/01/2019  . Acute bacterial endocarditis   . Acute left-sided weakness   . Acute ischemic stroke ([place]) 10/20/2019  . Hyperkalemia 09/10/2019  . End-stage renal disease on hemodialysis ([place])   . Nausea vomiting and diarrhea   . Bacteremia due to Pseudomonas 07/20/2019  . ESRD needing dialysis ([place]) 05/15/2019  . Anemia of chronic renal failure 04/03/2018  . Essential hypertension 04/03/2018  . Type 2 diabetes mellitus with diabetic nephropathy ([place]) 04/03/2018    Expected Discharge Date: Expected Discharge Date: (3-4 weeks-ELOS)  Team Members Present: Physician leading conference: Dr. Delice Lesch Social Worker Present: Ovidio Kin, LCSW Nurse Present: Other (comment)(Blair [place]-LPN) Case Manager:Genie Edem Tiegs, RN  PT Present: Becky Sax, PT OT Present: Roanna Epley, COTA SLP Present: Jettie Booze, CF-SLP PPS Coordinator present : Gunnar Fusi, SLP     Current Status/Progress Goal Weekly Team Focus  Bowel/Bladder   oliguric, LBM 12/22. continent of bowel  maintain continence, continue HD X3 weekly  assess toileting needs qshift and PRN   Swallow/Nutrition/ Hydration             ADL's   Max assist transfers, Max assist ADLs, decreased initiation and participation with self-care and therapeutic activities.  Pt with increased confusion and agitation.  Still supervision, do need to downgrade but unsure to what as pt has had a decrease in participation  ADL retraining, activity tolerance, pain management, BUE NMR, pt/family education   Mobility   bed mobility min/mod A, sitting balance mod/min A, stand<>pivot transfers mod A, gait 70ft with RW +2  Supervision CGA for ambulation and stairs  recently restricted to bed level exercises; LE strength, improved endurance, transfers, ambulation, balance   Communication             Safety/Cognition/ Behavioral Observations  Minimal/fluctuating progress this week, Mod-Max A due to medical instability that resulted in increased confusion and limited participation at times  Supervision A  basic problem solving, day to day recall, use of compensatory strategies, sustained and selective attention   Pain   lower back pain on sheduled tylenol and PRN oxycodone 5mg  q6  <3  assess pain qshift and PRN   Skin   stage 2 to sacrum foam dressing intact.  Improvement of sacral wound no furhter skin breakdown.  assess skin qshift and PRN      *See Care Plan and progress notes for long and short-term goals.     Barriers to Discharge  Current Status/Progress Possible Resolutions Date Resolved   Nursing                  PT  Medical stability;Home environment access/layout  increased confusion and restricted to bed level activity              OT                  SLP                SW                Discharge Planning/Teaching Needs:   Medical issues this week and not able to participate in therapies. Son is here daily and provides support and keeps up with medical issues.      Team Discussion: Back on bedrest due to elevated INR, enc to eat green leafy vegetables, has sacral wound, BS and BP up/down, will repeat urine culture, hospitalist consulted yesterday.  RN - pain sacral area, confusion, will get urine culture.  OT max pivot, mod to total A ADLs, less participatory, goals S, downgrade to mod A.  PT ?what can family do, ?train on bed level transfers, max a EOB, transfers +2 with steady, very confused, screaming out, goals mod A, max A goals for stairs.  SLP max A cognition, not progressing, needs lots of cues, will downgrade goals.  Recommend change to 15/7 therapies.  Is a HD patient.   Revisions to Treatment Plan: N/A     Medical Summary Current Status: Impaired gait with left side weakness secondary to right basal ganglia infarction as well as small cortical infarct left frontal lobe Weekly Focus/Goal: Improve mobility, cognition, DM/BP, bacteremia, candidiasis, confusion  Barriers to Discharge: Hemodialysis;Medical stability;IV antibiotics   Possible Resolutions to Barriers: Therapies, follow Hospitalist recs, optimize DM/HTN meds, IV abx   Continued Need for Acute Rehabilitation Level of Care: The patient requires daily medical management by a physician with specialized training in physical medicine and rehabilitation for the following reasons: Direction of a multidisciplinary physical rehabilitation program to maximize functional independence : Yes Medical management of patient stability for increased activity during participation in an intensive rehabilitation regime.: Yes Analysis of laboratory values and/or radiology reports with any subsequent need for medication adjustment and/or medical intervention. : Yes   I attest that I was present, lead the team conference, and concur with the assessment and plan of  the team.   [name] 11/15/2019, 6:46 PM  Team conference was held via web/ teleconference due to [place] - 19

## 2019-11-15 NOTE — Progress Notes (Signed)
Occupational Therapy Session Note  Patient Details  Name: [name] MRN: [id number] Date of Birth: 03/01/1951  Today's Date: 11/15/2019 OT Individual Time: 0700-0730 OT Individual Time Calculation (min): 30 min  and Today's Date: 11/15/2019 OT Missed Time: 30 Minutes Missed Time Reason: Other (comment);Patient fatigue   Short Term Goals: Week 2:  OT Short Term Goal 1 (Week 2): Pt will perform shower transfer with mod A. OT Short Term Goal 2 (Week 2): Pt will perform toileting with mod A. OT Short Term Goal 3 (Week 2): Pt will complete LB dressing with mod assist OT Short Term Goal 4 (Week 2): Pt will tolerate sitting OOB 2 hrs to demonstrate increased activity tolerance  Skilled Therapeutic Interventions/Progress Updates:    Pt resting/asleep(?) in bed upon arrival.  Pt did not initially respond to verbal attempt at arousing.  Pt initially opened her eyes and then closed her eyes and turned head to the side when therapist introduced.  Pt did not respond to therapist's attempts at engaging pt in conversation or responding to yes/no questions.  Pt finally agreed to eating breakfast.  Pt grimaced and yelled out when repositioned in bed to prepare for eating breakfast.  Pt started grimacing and groaning when HOB elevated to safe position to eat breakfast.  Pt's bed returned to original position.  Pt did not respond to any further requests or comments by therapist.  Pt missed 30 mins skilled OT services secondary pt unable to actively participate in therapy.   Therapy Documentation Precautions:  Precautions Precautions: Fall Precaution Comments: Strong L lateral lean, TLSO for mobility Required Braces or Orthoses: Other Brace(TLSO) Spinal Brace: Thoracolumbosacral orthotic Spinal Brace Comments: for comfort with mobility Restrictions Weight Bearing Restrictions: No General: General OT Amount of Missed Time: 30 Minutes Pain:  Pt grimaced when repositioned in  bed   Therapy/Group: Individual Therapy  Leroy Libman 11/15/2019, 7:33 AM

## 2019-11-15 NOTE — Plan of Care (Signed)
Problem: RH Balance Goal: LTG Patient will maintain dynamic sitting balance (PT) Description: LTG:  Patient will maintain dynamic sitting balance with assistance during mobility activities (PT) Flowsheets (Taken 11/15/2019 0951) LTG: Pt will maintain dynamic sitting balance during mobility activities with:: (downgraded due to increased confusion, decreased participation, decreased trunk control) Minimal Assistance - Patient > 75% Note: downgraded due to increased confusion, decreased participation, decreased trunk control Goal: LTG Patient will maintain dynamic standing balance (PT) Description: LTG:  Patient will maintain dynamic standing balance with assistance during mobility activities (PT) Flowsheets (Taken 11/15/2019 0951) LTG: Pt will maintain dynamic standing balance during mobility activities with:: (downgraded due to increased confusion, decreased participation, decreased LE strength and trunk control) Moderate Assistance - Patient 50 - 74% Note: downgraded due to increased confusion, decreased participation, decreased LE strength and trunk control   Problem: Sit to Stand Goal: LTG:  Patient will perform sit to stand with assistance level (PT) Description: LTG:  Patient will perform sit to stand with assistance level (PT) Flowsheets (Taken 11/15/2019 0951) LTG: PT will perform sit to stand in preparation for functional mobility with assistance level: (downgraded due to increased confusion, decreased participation, decreased LE strength and trunk control) Moderate Assistance - Patient 50 - 74% Note: downgraded due to increased confusion, decreased participation, decreased LE strength and trunk control   Problem: RH Bed Mobility Goal: LTG Patient will perform bed mobility with assist (PT) Description: LTG: Patient will perform bed mobility with assistance, with/without cues (PT). Flowsheets (Taken 11/15/2019 0951) LTG: Pt will perform bed mobility with assistance level of:  (downgraded due to increased confusion, decreased participation, decreased trunk control) Moderate Assistance - Patient 50 - 74% Note: downgraded due to increased confusion, decreased participation, decreased trunk control   Problem: RH Bed to Chair Transfers Goal: LTG Patient will perform bed/chair transfers w/assist (PT) Description: LTG: Patient will perform bed to chair transfers with assistance (PT). Flowsheets (Taken 11/15/2019 0951) LTG: Pt will perform Bed to Chair Transfers with assistance level: (downgraded due to increased confusion, decreased participation, decreaesd LE strength, decreased trunk control) Moderate Assistance - Patient 50 - 74% Note: downgraded due to increased confusion, decreased participation, decreaesd LE strength, decreased trunk control   Problem: RH Car Transfers Goal: LTG Patient will perform car transfers with assist (PT) Description: LTG: Patient will perform car transfers with assistance (PT). Flowsheets (Taken 11/15/2019 0951) LTG: Pt will perform car transfers with assist:: (downgraded due to increased confusion, decreased participation, decreaesd LE strength, decreased trunk control) Moderate Assistance - Patient 50 - 74% Note: downgraded due to increased confusion, decreased participation, decreaesd LE strength, decreased trunk control   Problem: RH Ambulation Goal: LTG Patient will ambulate in controlled environment (PT) Description: LTG: Patient will ambulate in a controlled environment, # of feet with assistance (PT). Flowsheets (Taken 11/15/2019 0951) LTG: Pt will ambulate in controlled environ  assist needed:: (downgraded due to increased confusion, decreased participation, decreaesd LE strength, decreased trunk control) Moderate Assistance - Patient 50 - 74% LTG: Ambulation distance in controlled environment: 48ft with LRAD Note: downgraded due to increased confusion, decreased participation, decreaesd LE strength, decreased trunk control Goal:  LTG Patient will ambulate in home environment (PT) Description: LTG: Patient will ambulate in home environment, # of feet with assistance (PT). Flowsheets (Taken 11/15/2019 0951) LTG: Pt will ambulate in home environ  assist needed:: (downgraded due to increased confusion, decreased participation, decreaesd LE strength, decreased trunk control) Moderate Assistance - Patient 50 - 74% LTG: Ambulation distance in home environment: 46ft with  LRAD Note: downgraded due to increased confusion, decreased participation, decreaesd LE strength, decreased trunk control   Problem: RH Wheelchair Mobility Goal: LTG Patient will propel w/c in controlled environment (PT) Description: LTG: Patient will propel wheelchair in controlled environment, # of feet with assist (PT) Flowsheets (Taken 11/15/2019 0951) LTG: Pt will propel w/c in controlled environ  assist needed:: (downgraded due to increased confusion, decreased participation, decreaesd UE strength,) Minimal Assistance - Patient > 75% LTG: Propel w/c distance in controlled environment: 14ft Note: downgraded due to increased confusion, decreased participation, decreaesd UE strength,  Goal: LTG Patient will propel w/c in home environment (PT) Description: LTG: Patient will propel wheelchair in home environment, # of feet with assistance (PT). Flowsheets (Taken 11/15/2019 0951) LTG: Pt will propel w/c in home environ  assist needed:: (downgraded due to increased confusion, decreased participation, decreaesd UE strength,) Minimal Assistance - Patient > 75% LTG: Propel w/c distance in home environment: 56ft Note: downgraded due to increased confusion, decreased participation, decreaesd UE strength,    Problem: RH Stairs Goal: LTG Patient will ambulate up and down stairs w/assist (PT) Description: LTG: Patient will ambulate up and down # of stairs with assistance (PT) Flowsheets (Taken 11/15/2019 0951) LTG: Pt will ambulate up/down stairs assist needed::  (downgraded due to increased confusion, decreased participation, decreased LE strength, decreased trunk control) Maximal Assistance - Patient 25 - 49% LTG: Pt will  ambulate up and down number of stairs: 3 stairs with 2 rails Note: downgraded due to increased confusion, decreased participation, decreased LE strength, decreased trunk control

## 2019-11-15 NOTE — Plan of Care (Signed)
  Problem: Consults Goal: RH STROKE PATIENT EDUCATION Description: See Patient Education module for education specifics  Outcome: Progressing Goal: Nutrition Consult-if indicated Outcome: Progressing Goal: Diabetes Guidelines if Diabetic/Glucose > 140 Description: If diabetic or lab glucose is > 140 mg/dl - Initiate Diabetes/Hyperglycemia Guidelines & Document Interventions  Outcome: Progressing   Problem: RH BOWEL ELIMINATION Goal: RH STG MANAGE BOWEL WITH ASSISTANCE Description: STG Manage Bowel with mod I/min Assistance. Outcome: Progressing Goal: RH STG MANAGE BOWEL W/MEDICATION W/ASSISTANCE Description: STG Manage Bowel with Medication with mod I Assistance. Outcome: Progressing   Problem: RH SKIN INTEGRITY Goal: RH STG SKIN FREE OF INFECTION/BREAKDOWN Description: Patients skin will remain free from further infection or breakdown with min assist. Outcome: Progressing Goal: RH STG MAINTAIN SKIN INTEGRITY WITH ASSISTANCE Description: STG Maintain Skin Integrity With min Assistance. Outcome: Progressing   Problem: RH SAFETY Goal: RH STG ADHERE TO SAFETY PRECAUTIONS W/ASSISTANCE/DEVICE Description: STG Adhere to Safety Precautions With supervision/min Assistance/Device. Outcome: Progressing Goal: RH STG DECREASED RISK OF FALL WITH ASSISTANCE Description: STG Decreased Risk of Fall With supervision/min Assistance. Outcome: Progressing   Problem: RH PAIN MANAGEMENT Goal: RH STG PAIN MANAGED AT OR BELOW PT'S PAIN GOAL Description: < 5 Outcome: Progressing   Problem: RH KNOWLEDGE DEFICIT Goal: RH STG INCREASE KNOWLEDGE OF DIABETES Description: Patient/caregiver will verbalize understanding of DM including diet, exercise, medications, monitoring, and follow up care with min assist. Outcome: Progressing Goal: RH STG INCREASE KNOWLEDGE OF HYPERTENSION Description: Patient/caregiver will verbalize understanding of HTN including diet, exercise, medications, monitoring, and follow  up care with min assist. Outcome: Progressing Goal: RH STG INCREASE KNOWLEGDE OF HYPERLIPIDEMIA Description: Patient/caregiver will verbalize understanding of HLD including diet, exercise, medications, monitoring, and follow up care with min assist. Outcome: Progressing Goal: RH STG INCREASE KNOWLEDGE OF STROKE PROPHYLAXIS Description: Patient/caregiver will verbalize understanding of stroke prophylaxis including diet, exercise, medications, monitoring, and follow up care with min assist. Outcome: Progressing

## 2019-11-15 NOTE — Progress Notes (Signed)
Speech Language Pathology Daily Session Note  Patient Details  Name: [name] MRN: [id number] Date of Birth: [birth year]  Today's Date: 11/15/2019 SLP Individual Time: 1100-1125 SLP Individual Time Calculation (min): 25 min  Short Term Goals: Week 2: SLP Short Term Goal 1 (Week 2): Pt will demonstrate functional problem solving during basic tasks with Min A verbal/visual cues SLP Short Term Goal 2 (Week 2): Pt will demonstrate recall of day to day/new information with Min A verbal cues for use of compensatory memory strategies SLP Short Term Goal 3 (Week 2): Pt will selectively attend to functional tasks with Supervision A verbal cues for redirection SLP Short Term Goal 4 (Week 2): Pt will demonstrate anticipatory awareness by stating 2 ADLs she will need assistance with upon her return home with Min A verbal/visual cues  Skilled Therapeutic Interventions: Pt was seen for skilled ST targeting cognitive goals. Pt was sleeping upon therapist's arrival; she would arouse to soft touch and moderate volume voice, however Max A verbal cues required for pt to open eyes and achieve focused attention throughout session. Pt independently oriented to place, month, and reason for admission, Min A verbal cues required to orient to specific date. Pt complaining of back pain, intermittently yelling out about pain and for family members who are were not present. Max A multimodal cues were required to verbally then functionally problem solve repositioning in order to alleviate back pain. Pt also required Max A for initiation and problem solving to resolve feeling cold. Pt verbally recalled having a therapy session earlier in day, however multiple choice cues required for pt to recall any specific activities targeted. Returned demonstration of use of call bell with Min A question cues. Pt left laying in bed with alarm set and needs within reach. Continue per current plan of care.       Pain Pain  Assessment Pain Scale: Faces Pain Score: 4  Faces Pain Scale: Hurts a little bit Pain Type: Acute pain Pain Location: Back Pain Orientation: Lower Pain Descriptors / Indicators: Discomfort;Grimacing Pain Frequency: Constant Pain Onset: Unable to tell Patients Stated Pain Goal: 2 Pain Intervention(s): RN made aware Multiple Pain Sites: No  Therapy/Group: Individual Therapy  Arbutus Leas 11/15/2019, 11:28 AM

## 2019-11-15 NOTE — Significant Event (Signed)
CRITICAL VALUE ALERT  Critical Value:  INR 5.6   Date & Time Notied:  11/15/2019  0828   Provider Notified: Marlowe Shores, PA-C   Orders Received/Actions taken: Doctors aware and await orders.

## 2019-11-15 NOTE — Progress Notes (Signed)
Physical Therapy Weekly Progress Note  Patient Details  Name: [name] MRN: [id number] Date of Birth: 17-Feb-1951  Beginning of progress report period: November 04, 2019 End of progress report period: November 15, 2019  Today's Date: 11/15/2019 PT Individual Time: 6767-2094 PT Individual Time Calculation (min): 38 min  and Today's Date: 11/15/2019 PT Missed Time: 22 Minutes Missed Time Reason: Patient fatigue;Patient unwilling to participate;Increased agitation  Patient has met 0 of 3 short term goals. Pt with increased confusion and agitation throughout sessions. Pt randomly screaming out for family members who are not present and stating "get me out of here!" Pt demonstrated improvements in functional mobility/transfers, ambulation with RW, LE and core strength, and endurance last week however recently regressing and therapist downgraded goals to mod A overall. Currently pt requires max A for bed mobility, max A to maintain seated balance, and total assist +2 to transfer bed<>WC via stedy. Pt refusing to particpate in therapy despite education on importance of OOB mobility.   Patient continues to demonstrate the following deficits muscle weakness, decreased coordination and decreased motor planning and decreased sitting balance, decreased standing balance, decreased postural control, hemiplegia and decreased balance strategies and therefore will continue to benefit from skilled PT intervention to increase functional independence with mobility.  Patient not progressing toward long term goals.  See goal revision.. Continue plan of care.  PT Short Term Goals Week 2:  PT Short Term Goal 1 (Week 2): Pt will transfer supine<>sit and sit<>supine with min A PT Short Term Goal 1 - Progress (Week 2): Not progressing PT Short Term Goal 2 (Week 2): Pt will transfer stand<>pivot with LRAD mod A PT Short Term Goal 2 - Progress (Week 2): Not progressing PT Short Term Goal 3 (Week 2): Pt will  perform car transfer with LRAD mod A PT Short Term Goal 3 - Progress (Week 2): Not progressing Week 3:  PT Short Term Goal 1 (Week 3): Pt will transfer supine<>sit and sit<>supine with min A PT Short Term Goal 2 (Week 3): Pt will transfer stand<>pivot with LRAD mod A PT Short Term Goal 3 (Week 3): Pt will perform car transfer with LRAD mod A  Skilled Therapeutic Interventions/Progress Updates:  Ambulation/gait training;Discharge planning;DME/adaptive equipment instruction;Functional mobility training;Pain management;Psychosocial support;Splinting/orthotics;Therapeutic Activities;UE/LE Strength taining/ROM;Balance/vestibular training;Community reintegration;Disease management/prevention;Functional electrical stimulation;Neuromuscular re-education;Patient/family education;Stair training;Therapeutic Exercise;UE/LE Coordination activities;Wheelchair propulsion/positioning   Today's Interventions: Received pt supine in bed, pt agreeable to therapy, and stated pain in low back but did not state number. Pt with eyes closed throughout majority of session requiring max cues to open eyes and participate in therapy. Session focused on functional mobility, OOB activity tolerance, improved participation, and endurance with activity. Pt rolled to L with min A and use of bedrails. Pt transferred supine<>sitting EOB max A with HOB elevated and required mod/max A to maintain seated balance. Therapist suggested stand<>pivot to [facility] however pt refused stating "get the lift, get the lift!". Pt transferred bed<>WC via stedy total assist +2 to maintain seated balance on stedy. Pt transported to gym in [facility] for time management purposes. In dayroom, therapist encouraged pt to participate in holiday activities however pt refused. Pt continued to state "get me out of here!" and "take me back to my room now". Therapist educated pt on importance of OOB activity and increased participation in therapy however pt not in agreement and  refusing to participate in any further therapy. Therapist attempted to create ornament with pt with encouragement for pt to place objects in the ornament and  help string it, however pt refused and demonstrated increased agitation demanding to return to room. Pt transferred WC<>bed via stedy +2 total assist and sitting EOB<>supine with max A. Concluded session with pt supine in bed, needs within reach, and bed alarm on. 22 minutes of missed skilled physical therapy.    Therapy Documentation Precautions:  Precautions Precautions: Fall Precaution Comments: Strong L lateral lean, TLSO for mobility Required Braces or Orthoses: Other Brace(TLSO) Spinal Brace: Thoracolumbosacral orthotic Spinal Brace Comments: for comfort with mobility Restrictions Weight Bearing Restrictions: No  Therapy/Group: Individual Therapy Alfonse Alpers PT, DPT   11/15/2019, 7:36 AM

## 2019-11-15 NOTE — Progress Notes (Addendum)
PROGRESS NOTE    [name]  [id number] DOB: 12/15/1950 DOA: 11/01/2019 PCP: Manon Hilding, MD   Brief Narrative: [name] is a 68 y.o. female with a history of ESRD on HD, mitral valve endocarditis, hepatic encephalopathy, chronic anemia, diabetes mellitus, CVA. Patient is currently admitted to inpatient rehab for post-CVA rehabilitation and was found to have worsening mentation.   Assessment & Plan:   Principal Problem:   Cerebrovascular accident (CVA) of right basal ganglia (HCC) Active Problems:   Slow transit constipation   Acute on chronic anemia   Labile blood pressure   Labile blood glucose   Diabetes mellitus type 2 in nonobese (HCC)   Bacteremia   Endocarditis of mitral valve   Septic embolism (HCC)   Seizure (HCC)   Candidiasis   Encephalopathy, hepatic (HCC)   Hyperammonemia (HCC)   Sleep disturbance   Hemodialysis-associated hypotension   Supratherapeutic INR   Gastroesophageal reflux disease   Decubitus ulcer of sacral region, unstageable ([place])   Encephalopathy Unknown etiology. Thought possibly secondary to delirium. Appears to be improved today but just somnolent possibly secondary to Seroquel overnight. -Discontinue Seroquel for now -Continue lactulose  Diabetes mellitus, type 2 Patient is on insulin NPH-regular (70-30) 4 units BID WC as an outpatient. Currently on SSI. Blood sugar somewhat controlled. -Continue SSI for now  Hyperammonemia History of hepatic encephalopathy -Continue lactulose  Abdominal pain Appears acute. Abdomen distended. X-ray on 12/22 significant for gaseous distension.  -Repeat abdominal x-ray -If negative x-ray, will obtain CT abdomen/pelvis  Pelvic pain Unknown etiology. No trauma noted. Patient does have a history of sacral decubitus ulcer. Afebrile and normal WBC. -Pelvic x-ray  Coagulopathy Secondary to Coumadin. INR still increasing. No obvious source of bleeding. Hemoglobin stable. -Watch INR  and watch for evidence of bleeding  Mitral valve endocarditis Seen by cardiothoracic surgery on medical admission. No plan for replacement valve at this acute period but will need follow-up as an outpatient. On Cefepime per infectious disease with last recommended dose on 12/06/2019.  Essential hypertension -Continue Imdur  CVA Recently hospitalized and is now undergoing inpatient rehabilitation. Per stroke team, does not appear aspirin was considered for continuation on discharge. -Continue Coumadin  Pressure injury Medial sacrum, not POA    DVT prophylaxis: Coumadin (currently held secondary to supertherapeutic INR) Code Status:   Code Status: DNR Family Communication: None at bedside   Procedures:   HD  Antimicrobials:  Cefepime    Subjective: Back pain. Feels sleepy  Objective: Vitals:   11/14/19 0326 11/14/19 1353 11/14/19 1917 11/15/19 0620  BP: 132/75 (!) 90/57 102/67 (!) 151/75  Pulse: 96 84 93 92  Resp: 16 16 16 17   Temp: 98 F (36.7 C) 98.6 F (37 C) 97.9 F (36.6 C) 98.6 F (37 C)  TempSrc: Oral Oral Oral   SpO2: 100% 100% 92% 100%  Weight: 59.9 kg     Height:        Intake/Output Summary (Last 24 hours) at 11/15/2019 1238 Last data filed at 11/14/2019 1845 Gross per 24 hour  Intake 60 ml  Output --  Net 60 ml   Filed Weights   11/13/19 1240 11/13/19 1613 11/14/19 0326  Weight: 60.4 kg 59.6 kg 59.9 kg    Examination:  General exam: Appears calm and comfortable Respiratory system: Clear to auscultation. Respiratory effort normal. Cardiovascular system: S1 & S2 heard, RRR. 2/6 systolic murmur. Gastrointestinal system: Abdomen is mildly distended, soft and tender especially in RUQ. Normal bowel sounds heard. Central nervous  system: Somnolent but arouses easily and is oriented. No focal neurological deficits. Extremities: No edema. No calf tenderness Skin: No cyanosis. No rashes Psychiatry: Judgement and insight appear normal. Mood &  affect appropriate.     Data Reviewed: I have personally reviewed following labs and imaging studies  CBC: Recent Labs  Lab 11/09/19 0731 11/10/19 1405 11/10/19 1759 11/13/19 1134 11/15/19 1036  WBC 9.2 10.0 12.2* 12.6* 12.6*  NEUTROABS 6.7  --   --   --   --   HGB 10.3* 9.4* 10.8* 9.1* 8.5*  HCT 32.1* 30.1* 34.0* 28.4* 26.3*  MCV 92.2 94.4 91.9 91.0 88.9  PLT 272 215 209 199 [id number]   Basic Metabolic Panel: Recent Labs  Lab 11/08/19 1254 11/10/19 1405 11/10/19 1759 11/13/19 1248 11/15/19 1036  NA 128* 136 134* 131* 135  K 4.7 3.9 3.3* 4.4 3.8  CL 90* 95* 93* 94* 96*  CO2 24 26 27 22 22   GLUCOSE 200* 168* 173* 198* 138*  BUN 38* 33* 9 57* 35*  CREATININE 7.32* 7.14* 3.17* 8.41* 6.38*  CALCIUM 8.8* 8.7* 8.5* 8.3* 8.6*  PHOS 4.7* 3.5 2.2* 5.2*  --    GFR: Estimated Creatinine Clearance: 7.9 mL/min (A) (by C-G formula based on SCr of 6.38 mg/dL (H)). Liver Function Tests: Recent Labs  Lab 11/08/19 1254 11/10/19 1405 11/10/19 1759 11/13/19 1248 11/15/19 1036  AST  --   --   --   --  20  ALT  --   --   --   --  57*  ALKPHOS  --   --   --   --  111  BILITOT  --   --   --   --  0.4  PROT  --   --   --   --  6.6  ALBUMIN 2.2* 2.2* 2.4* 1.7* 2.0*   No results for input(s): LIPASE, AMYLASE in the last 168 hours. Recent Labs  Lab 11/09/19 0731 11/14/19 0522 11/15/19 1036  AMMONIA 40* 25 33   Coagulation Profile: Recent Labs  Lab 11/11/19 0608 11/12/19 0822 11/13/19 1134 11/14/19 0522 11/15/19 0718  INR 2.9* 4.8* 5.3* 5.2* 5.6*   Cardiac Enzymes: No results for input(s): CKTOTAL, CKMB, CKMBINDEX, TROPONINI in the last 168 hours. BNP (last 3 results) No results for input(s): PROBNP in the last 8760 hours. HbA1C: No results for input(s): HGBA1C in the last 72 hours. CBG: Recent Labs  Lab 11/14/19 1156 11/14/19 1655 11/14/19 2047 11/15/19 0619 11/15/19 1140  GLUCAP 244* 130* 258* 147* 120*   Lipid Profile: Recent Labs    11/15/19 0718  CHOL  120  HDL 28*  LDLCALC 57  TRIG 177*  CHOLHDL 4.3   Thyroid Function Tests: No results for input(s): TSH, T4TOTAL, FREET4, T3FREE, THYROIDAB in the last 72 hours. Anemia Panel: No results for input(s): VITAMINB12, FOLATE, FERRITIN, TIBC, IRON, RETICCTPCT in the last 72 hours. Sepsis Labs: No results for input(s): PROCALCITON, LATICACIDVEN in the last 168 hours.  Recent Results (from the past 240 hour(s))  Culture, Urine     Status: Abnormal   Collection Time: 11/06/19 11:07 AM   Specimen: Urine, Random  Result Value Ref Range Status   Specimen Description URINE, RANDOM  Final   Special Requests   Final    NONE Performed at [facility], [address] [place], [place] [address]    Culture 80,000 COLONIES/mL YEAST (A)  Final   Report Status 11/07/2019 FINAL  Final  Radiology Studies: DG Abd 1 View  Result Date: 11/14/2019 CLINICAL DATA:  Abdominal pain EXAM: ABDOMEN - 1 VIEW COMPARISON:  None. FINDINGS: Diffusely gas distended colon. No dilated small bowel is visible. No abnormal calcification. IMPRESSION: Diffusely gas distended colon. Electronically Signed   By: Ulyses Jarred M.D.   On: 11/14/2019 03:52   MR BRAIN WO CONTRAST  Result Date: 11/14/2019 CLINICAL DATA:  Septic arterial embolism. Native endocarditis of the mitral valve with concern for CNS disease. EXAM: MRI HEAD WITHOUT CONTRAST TECHNIQUE: Multiplanar, multiecho pulse sequences of the brain and surrounding structures were obtained without intravenous contrast. COMPARISON:  Eight days ago FINDINGS: Brain: Subacute to chronic infarcts in the bilateral corona radiata and centrum semiovale. Chronic infarct in the right caudate head. Chronic small left inferior cerebellar infarcts. No adverse features or liquefaction. No acute or interval infarct. No hemorrhage, hydrocephalus, or collection. Vascular: Normal flow voids Skull and upper cervical spine: Generally hypointense marrow signal which could be from  patient's end-stage renal disease or anemia Sinuses/Orbits: Negative IMPRESSION: Stable non-acute infarcts.  No evidence of abscess or acute process. Electronically Signed   By: Monte Fantasia M.D.   On: 11/14/2019 09:23        Scheduled Meds: . acetaminophen  500 mg Oral TID  . calcium carbonate  1 tablet Oral 2 times per day  . carvedilol  12.5 mg Oral BID  . Chlorhexidine Gluconate Cloth  6 each Topical Q0600  . darbepoetin (ARANESP) injection - DIALYSIS  60 mcg Intravenous Q Mon-HD  . doxercalciferol  1.5 mcg Intravenous Q M,W,F-HD  . feeding supplement (ENSURE ENLIVE)  237 mL Oral BID BM  . insulin aspart  0-9 Units Subcutaneous TID WC  . isosorbide mononitrate  30 mg Oral QPC lunch  . lactulose  20 g Oral TID  . Melatonin  3 mg Oral QHS  . multivitamin  1 tablet Oral QPC lunch  . pantoprazole  40 mg Oral Daily  . QUEtiapine  12.5 mg Oral QHS  . Warfarin - Pharmacist Dosing Inpatient   Does not apply q1800   Continuous Infusions: . sodium chloride    . sodium chloride    . ceFEPime (MAXIPIME) IV 1 g (11/14/19 2005)     LOS: 14 days     Cordelia Poche, MD [facility] 11/15/2019, 12:38 PM  If 7PM-7AM, please contact night-coverage [email or web address]

## 2019-11-15 NOTE — Progress Notes (Addendum)
[facility] PHYSICAL MEDICINE & REHABILITATION PROGRESS NOTE   Subjective/Complaints: Patient seen laying in bed this morning.  She states she slept well overnight, confirmed with sleep chart.  She remains confused this AM.  She was seen by ID and hospitalist yesterday, notes reviewed.  Discussed with hospitalist.  ROS: Denies CP, SOB, N/V/D  Objective:   DG Abd 1 View  Result Date: 11/14/2019 CLINICAL DATA:  Abdominal pain EXAM: ABDOMEN - 1 VIEW COMPARISON:  None. FINDINGS: Diffusely gas distended colon. No dilated small bowel is visible. No abnormal calcification. IMPRESSION: Diffusely gas distended colon. Electronically Signed   By: Ulyses Jarred M.D.   On: 11/14/2019 03:52   MR BRAIN WO CONTRAST  Result Date: 11/14/2019 CLINICAL DATA:  Septic arterial embolism. Native endocarditis of the mitral valve with concern for CNS disease. EXAM: MRI HEAD WITHOUT CONTRAST TECHNIQUE: Multiplanar, multiecho pulse sequences of the brain and surrounding structures were obtained without intravenous contrast. COMPARISON:  Eight days ago FINDINGS: Brain: Subacute to chronic infarcts in the bilateral corona radiata and centrum semiovale. Chronic infarct in the right caudate head. Chronic small left inferior cerebellar infarcts. No adverse features or liquefaction. No acute or interval infarct. No hemorrhage, hydrocephalus, or collection. Vascular: Normal flow voids Skull and upper cervical spine: Generally hypointense marrow signal which could be from patient's end-stage renal disease or anemia Sinuses/Orbits: Negative IMPRESSION: Stable non-acute infarcts.  No evidence of abscess or acute process. Electronically Signed   By: Monte Fantasia M.D.   On: 11/14/2019 09:23   Recent Labs    11/13/19 1134  WBC 12.6*  HGB 9.1*  HCT 28.4*  PLT 199   Recent Labs    11/13/19 1248  NA 131*  K 4.4  CL 94*  CO2 22  GLUCOSE 198*  BUN 57*  CREATININE 8.41*  CALCIUM 8.3*    Intake/Output Summary (Last 24  hours) at 11/15/2019 0905 Last data filed at 11/14/2019 1845 Gross per 24 hour  Intake 60 ml  Output --  Net 60 ml     Physical Exam: Vital Signs Blood pressure (!) 151/75, pulse 92, temperature 98.6 F (37 C), resp. rate 17, height 5\' 7"  (1.702 m), weight 59.9 kg, SpO2 100 %. Constitutional: No distress . Vital signs reviewed. HENT: Normocephalic.  Atraumatic. Eyes: EOMI. No discharge. Cardiovascular: No JVD. Respiratory: Normal effort.  No stridor. GI: Non-distended. Skin: Sacral ulcer not examined today Psych: Flat Musc: No edema in extremities.  No tenderness in extremities. Neurological: Alert and oriented x2 Motor: RUE: 5/5 proximal to distal RLE: HF, KE 3+/5, ADF 4/5, stable LUE: 4+/5 proximal to distal LLE: HF, KE 3/5, ADF 3+/5, stable  Assessment/Plan: 1. Functional deficits secondary to right basal ganglia stroke which require 3+ hours per day of interdisciplinary therapy in a comprehensive inpatient rehab setting.  Physiatrist is providing close team supervision and 24 hour management of active medical problems listed below.  Physiatrist and rehab team continue to assess barriers to discharge/monitor patient progress toward functional and medical goals  Care Tool:  Bathing    Body parts bathed by patient: Right arm, Left arm, Chest, Abdomen, Front perineal area, Right upper leg, Left upper leg, Face   Body parts bathed by helper: Front perineal area, Buttocks, Right upper leg, Left upper leg, Right lower leg, Left lower leg, Face     Bathing assist Assist Level: Total Assistance - Patient < 25%     Upper Body Dressing/Undressing Upper body dressing   What is the patient wearing?: Pull  over shirt    Upper body assist Assist Level: Minimal Assistance - Patient > 75%    Lower Body Dressing/Undressing Lower body dressing      What is the patient wearing?: Pants, Incontinence brief     Lower body assist Assist for lower body dressing: Total Assistance  - Patient < 25%     Toileting Toileting    Toileting assist Assist for toileting: Maximal Assistance - Patient 25 - 49%     Transfers Chair/bed transfer  Transfers assist     Chair/bed transfer assist level: Dependent - mechanical lift(stedy)     Locomotion Ambulation   Ambulation assist   Ambulation activity did not occur: Safety/medical concerns(back pain, bilateral LE weakness, poor bilateral knee control)  Assist level: 2 helpers Assistive device: Walker-rolling Max distance: 88ft   Walk 10 feet activity   Assist  Walk 10 feet activity did not occur: Safety/medical concerns(back pain, bilateral LE weakness, poor bilateral knee control)  Assist level: 2 helpers Assistive device: Walker-rolling   Walk 50 feet activity   Assist Walk 50 feet with 2 turns activity did not occur: Safety/medical concerns(back pain, bilateral LE weakness, poor bilateral knee control)  Assist level: 2 helpers Assistive device: Walker-rolling    Walk 150 feet activity   Assist Walk 150 feet activity did not occur: Safety/medical concerns(back pain, bilateral LE weakness, poor bilateral knee control)         Walk 10 feet on uneven surface  activity   Assist Walk 10 feet on uneven surfaces activity did not occur: Safety/medical concerns(back pain, bilateral LE weakness, poor bilateral knee control)         Wheelchair     Assist Will patient use wheelchair at discharge?: Yes Type of Wheelchair: Manual Wheelchair activity did not occur: Safety/medical concerns(Pt could not tolerate sitting in WC for long enough to complete activity due to low back pain)  Wheelchair assist level: Minimal Assistance - Patient > 75% Max wheelchair distance: 30ft    Wheelchair 50 feet with 2 turns activity    Assist    Wheelchair 50 feet with 2 turns activity did not occur: Safety/medical concerns(Pt could not tolerate sitting in WC for long enough to complete activity due to  low back pain)       Wheelchair 150 feet activity     Assist  Wheelchair 150 feet activity did not occur: Safety/medical concerns(Pt could not tolerate sitting in WC for long enough to complete activity due to low back pain)       Blood pressure (!) 151/75, pulse 92, temperature 98.6 F (37 C), resp. rate 17, height 5\' 7"  (1.702 m), weight 59.9 kg, SpO2 100 %.    Medical Problem List and Plan: 1.  Impaired gait with left side weakness secondary to right basal ganglia infarction as well as small cortical infarct left frontal lobe  Continue bedrest  CT head on 12/13 personally reviewed, showing some improvement, MRI personally reviewed, stable. Repeat MRI ordered again by ID, showing stable infarcts  Chest x-ray personally reviewed, unremarkable for infection  Discussed with neurology results of the EEG -findings possibly related to uremia +/- cefepime  Discussed with psychiatry, continue medical management  Discussed with hospitalist, appreciate recs - started on Seroquel  Team conference today to discuss current and goals and coordination of care, home and environmental barriers, and discharge planning with nursing, case manager, and therapies.  2.  Antithrombotics: -DVT/anticoagulation: Chronic Coumadin for right IJ acute DVT.  Plan anticoagulation x3 months  INR critical value of 5.6, trending up, discussed with pharmacy again - will consider Vit K, however, need to be weary given recent clot             -antiplatelet therapy: N/A 3. Pain Management chronic back and left hip pain: Neurontin 100 mg on hold  TLSO back brace for comfort 4. Mood: Provide emotional support             -antipsychotic agents: N/A 5. Neuropsych: This patient is not capable of making decisions on her own behalf. 6. Skin/Wound Care: Routine skin checks  Santyl to sacral wound 7. Fluids/Electrolytes/Nutrition: Routine in and outs, may need TF if po intake does not improve  8.  Recurrent Pseudomonas  bacteremia with large mitral valve vegetation/endocarditis likely secondary to hemodialysis catheter.  New HD tunneled catheter placed 10/26/2019. 9.  ID/recurrent Pseudomonas bacteremia.    Continue Maxipime as directed with infectious disease to discuss duration of antibiotic therapies anticipate 6 weeks.  Cipro DC'd by ID due to seizure risk  Per ID, no changes in medications, of note, pt did NOT seizures 10.  Hemodialysis MWF.  Follow-up per renal services.  11.  Diabetes mellitus.  Hemoglobin A1c 7.6.  SSI. CBG (last 3)  Recent Labs    11/14/19 1655 11/14/19 2047 11/15/19 0619  GLUCAP 130* 258* 147*   Labile on 12/23 12.  Hypertension.  Imdur 30 mg daily  Coreg 6.25 mg twice daily, increased to 12.5 on 12/15, parameters placed.    Monitor with increased mobility.  Labile on 12/23 13.  Acute on chronic anemia.    Hemoglobin 9.1 on 12/21  Continue to monitor 14. Constipation:  See #16  Abd xray showing colonic distention  Improving 15.  Candidiasis  Diflucan ordered on 12/16  Repeat Ucx not collected yet on 12/23 16.  Hyperammonemia with hepatic encephalopathy  Lactulose started on 12/17, encourage compliance  Ammonia WNL on 12/22 17. Sleep disturance  Melatonin added on 12/17  Sleep chart ordered  ?Improving 18.  Leukocytosis  WBCs 12.6 on 12/21, labs with HD  Afebrile  Continue to monitor 19.  GERD  Home Protonix ordered on 12/20   LOS: 14 days A FACE TO FACE EVALUATION WAS PERFORMED  [name] [name] 11/15/2019, 9:05 AM

## 2019-11-15 NOTE — Progress Notes (Addendum)
[facility] Progress Note   Dialysis Orders: [facility] MWF3.5 hours TDC - mult failed accesses Profile 1 - pressure drops very easily;max is pull 1.1 an hour; do not pull rinseback revaclear 300 dialyzer 400/600 2 K 2.5 Ca EDW 62.5  Epo 1200 U q HD; iron 50 mg/weekly 1.5 mcg hectoral each tx  Assessment/Plan:  1. Recurrent Pseudomonas bacteremia (Likely HD CRB) with evidence of mitral valve endocarditisthat is post [facility] exchange for infection focus control. Seen by CTA w/no imminent plan for surgical intervention-this will be reevaluated as an outpatient following completion of antibiotic therapy with cefepime; now off cipro as of 12/15 d/t witnessed seizure.  2. ESRD: MWF -HD today hold heparin due to high INR unless catheter flow issues 3. Anemia:hgb  9.1 continue ESA-- no Fe for the short term due to currently being treatment for infection. 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  5. 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 Ongoing inpatient rehabilitation for management of neurological deficits. Repeat MRI showedno acute changes. INR 5.6 increasing  6. Hypertension/volume : net UF 800 ml 12/21 post wt 59.6 - keep SBP > 110 7.Nutrition -diet liberalized to regular - has supplements/multivits ordered - alb very low - not eating much - contributing to weakness 8. MS change since 12/13 - no obvious findings on work up. Per notes may have been cefepime related -MS seems to wax and wane . Neurontin 100 mg per day stopped 12/14 NH3 25 - per primary 9.Yeast UTI >80 K-s/p diflucan 10. Deconditioning -variable levels of functioning appear related to mental status.  Myriam Jacobson, PA-C [place] 11/15/2019,10:30 AM  LOS: 14 days    Patient seen and examined, agree with above note with above modifications.  Seen on HD "id like to get out of here"  Denies compliant otherwise.  Cont with routine HD and appropriate titration of HD related meds  [name], MD 11/15/2019     Subjective:   Lying in bed exposed.  C/o abdominal pain, where is her son. [birth year] (telling me her birthday without asking for it)  Objective Vitals:   11/14/19 0326 11/14/19 1353 11/14/19 1917 11/15/19 0620  BP: 132/75 (!) 90/57 102/67 (!) 151/75  Pulse: 96 84 93 92  Resp: 16 16 16 17   Temp: 98 F (36.7 C) 98.6 F (37 C) 97.9 F (36.6 C) 98.6 F (37 C)  TempSrc: Oral Oral Oral   SpO2: 100% 100% 92% 100%  Weight: 59.9 kg     Height:       Physical Exam General: breathing easily confused Heart: RRR Lungs: no rales Abdomen: soft generalized tenderness Extremities: no LE edema Dialysis Access:  Right IJ [facility]   Additional Objective Labs: Basic Metabolic Panel: Recent Labs  Lab 11/10/19 1405 11/10/19 1759 11/13/19 1248  NA 136 134* 131*  K 3.9 3.3* 4.4  CL 95* 93* 94*  CO2 26 27 22   GLUCOSE 168* 173* 198*  BUN 33* 9 57*  CREATININE 7.14* 3.17* 8.41*  CALCIUM 8.7* 8.5* 8.3*  PHOS 3.5 2.2* 5.2*   Liver Function Tests: Recent Labs  Lab 11/10/19 1405 11/10/19 1759 11/13/19 1248  ALBUMIN 2.2* 2.4* 1.7*   No results for input(s): LIPASE, AMYLASE in the last 168 hours. CBC: Recent Labs  Lab 11/09/19 0731 11/10/19 1405 11/10/19 1759 11/13/19 1134  WBC 9.2 10.0 12.2* 12.6*  NEUTROABS 6.7  --   --   --  HGB 10.3* 9.4* 10.8* 9.1*  HCT 32.1* 30.1* 34.0* 28.4*  MCV 92.2 94.4 91.9 91.0  PLT 272 215 209 199   Blood Culture    Component Value Date/Time   SDES URINE, RANDOM 11/06/2019 1107   SPECREQUEST  11/06/2019 1107    NONE Performed at [facility], [place] [address] [place], [place] [address]    CULT 80,000 COLONIES/mL YEAST (A) 11/06/2019 1107   REPTSTATUS 11/07/2019 FINAL 11/06/2019 1107    Cardiac Enzymes: No results for input(s): CKTOTAL, CKMB, CKMBINDEX, TROPONINI  in the last 168 hours. CBG: Recent Labs  Lab 11/14/19 0630 11/14/19 1156 11/14/19 1655 11/14/19 2047 11/15/19 0619  GLUCAP 176* 244* 130* 258* 147*   Iron Studies: No results for input(s): IRON, TIBC, TRANSFERRIN, FERRITIN in the last 72 hours. Lab Results  Component Value Date   INR 5.6 (HH) 11/15/2019   INR 5.2 (HH) 11/14/2019   INR 5.3 (HH) 11/13/2019   Studies/Results: DG Abd 1 View  Result Date: 11/14/2019 CLINICAL DATA:  Abdominal pain EXAM: ABDOMEN - 1 VIEW COMPARISON:  None. FINDINGS: Diffusely gas distended colon. No dilated small bowel is visible. No abnormal calcification. IMPRESSION: Diffusely gas distended colon. Electronically Signed   By: Ulyses Jarred M.D.   On: 11/14/2019 03:52   MR BRAIN WO CONTRAST  Result Date: 11/14/2019 CLINICAL DATA:  Septic arterial embolism. Native endocarditis of the mitral valve with concern for CNS disease. EXAM: MRI HEAD WITHOUT CONTRAST TECHNIQUE: Multiplanar, multiecho pulse sequences of the brain and surrounding structures were obtained without intravenous contrast. COMPARISON:  Eight days ago FINDINGS: Brain: Subacute to chronic infarcts in the bilateral corona radiata and centrum semiovale. Chronic infarct in the right caudate head. Chronic small left inferior cerebellar infarcts. No adverse features or liquefaction. No acute or interval infarct. No hemorrhage, hydrocephalus, or collection. Vascular: Normal flow voids Skull and upper cervical spine: Generally hypointense marrow signal which could be from patient's end-stage renal disease or anemia Sinuses/Orbits: Negative IMPRESSION: Stable non-acute infarcts.  No evidence of abscess or acute process. Electronically Signed   By: Monte Fantasia M.D.   On: 11/14/2019 09:23   Medications: . sodium chloride    . sodium chloride    . ceFEPime (MAXIPIME) IV 1 g (11/14/19 2005)   . acetaminophen  500 mg Oral TID  . calcium carbonate  1 tablet Oral 2 times per day  . carvedilol  12.5 mg  Oral BID  . Chlorhexidine Gluconate Cloth  6 each Topical Q0600  . darbepoetin (ARANESP) injection - DIALYSIS  60 mcg Intravenous Q Mon-HD  . doxercalciferol  1.5 mcg Intravenous Q M,W,F-HD  . feeding supplement (ENSURE ENLIVE)  237 mL Oral BID BM  . insulin aspart  0-9 Units Subcutaneous TID WC  . isosorbide mononitrate  30 mg Oral QPC lunch  . lactulose  20 g Oral TID  . Melatonin  3 mg Oral QHS  . multivitamin  1 tablet Oral QPC lunch  . pantoprazole  40 mg Oral Daily  . QUEtiapine  12.5 mg Oral QHS  . Warfarin - Pharmacist Dosing Inpatient   Does not apply [phone]

## 2019-11-15 NOTE — Progress Notes (Signed)
Pharmacy Antibiotic Note  [name] is a 68 y.o. female admitted on 10/20/19 with Pseudomonas bacteremia and MV IE.  Pharmacy has been consulted for Cefepime dosing.  The patient is ESRD-MWF. Noted ID plans for Cefepime 12/06/19. Cipro DC'd on 12/15 given witnessed seizure. Prefer to keep cefepime for better penetration per ID. Plan to switch to dosing with HD upon discharge.   Plan: - Continue Cefepime 1g IV every 24 hours after HD until 12/06/2019 - Switch cefepime to 2g after HD Mon/Wed, 3g after HD Fridays at discharge - Continue to monitor clinical status  Height: 5\' 7"  (170.2 cm) Weight: 132 lb 0.9 oz (59.9 kg) IBW/kg (Calculated) : 61.6  Temp (24hrs), Avg:98.4 F (36.9 C), Min:97.9 F (36.6 C), Max:98.6 F (37 C)  Recent Labs  Lab 11/08/19 1254 11/09/19 0731 11/10/19 1405 11/10/19 1759 11/13/19 1134 11/13/19 1248 11/15/19 1036  WBC  --  9.2 10.0 12.2* 12.6*  --  12.6*  CREATININE 7.32*  --  7.14* 3.17*  --  8.41* 6.38*    Estimated Creatinine Clearance: 7.9 mL/min (A) (by C-G formula based on SCr of 6.38 mg/dL (H)).    No Known Allergies  Antimicrobials this admission: Cefepime 11/30 >> (12/06/19) Flluconazole 12/16  Cipro 12/3 >> 12/14  Microbiology results: 11/27 COVID >> neg 11/29 BCx >> pseudomonas 4/8 (pan sens) 12/1 Bcx>>neg 12/6 BCx: neg 12/14 UCx: yeast    Thank you for allowing pharmacy to participate in this patient's care.  Benetta Spar, PharmD, BCPS, BCCP Clinical Pharmacist  Please check AMION for all [place] phone numbers After 10:00 PM, call [place] [phone]

## 2019-11-16 ENCOUNTER — Inpatient Hospital Stay (HOSPITAL_COMMUNITY): Payer: Medicare PPO | Admitting: Speech Pathology

## 2019-11-16 ENCOUNTER — Inpatient Hospital Stay (HOSPITAL_COMMUNITY): Payer: Medicare PPO

## 2019-11-16 LAB — GLUCOSE, CAPILLARY
Glucose-Capillary: 91 mg/dL (ref 70–99)
Glucose-Capillary: 96 mg/dL (ref 70–99)
Glucose-Capillary: 135 mg/dL — ABNORMAL HIGH (ref 70–99)
Glucose-Capillary: 195 mg/dL — ABNORMAL HIGH (ref 70–99)

## 2019-11-16 LAB — PROTIME-INR
INR: 5.2 (ref 0.8–1.2)
Prothrombin Time: 47.6 seconds — ABNORMAL HIGH (ref 11.4–15.2)

## 2019-11-16 MED ORDER — IOHEXOL 300 MG/ML  SOLN
75.0000 mL | Freq: Once | INTRAMUSCULAR | Status: AC | PRN
  Administered 2019-11-16: 75 mL via INTRAVENOUS

## 2019-11-16 MED ORDER — SODIUM CHLORIDE 0.9 % IV SOLN
125.0000 mg | INTRAVENOUS | Status: DC
  Administered 2019-11-20 – 2019-12-04 (×3): 125 mg via INTRAVENOUS
  Filled 2019-11-16 (×4): qty 10

## 2019-11-16 MED ORDER — DARBEPOETIN ALFA 100 MCG/0.5ML IJ SOSY
100.0000 ug | PREFILLED_SYRINGE | INTRAMUSCULAR | Status: DC
  Administered 2019-11-20 – 2019-12-04 (×2): 100 ug via INTRAVENOUS
  Filled 2019-11-16 (×3): qty 0.5

## 2019-11-16 NOTE — Consult Note (Signed)
CC: Consult by Dr. Teryl Lucy for pneumatosis of ascending colon  HPI: [name] is an 68 y.o. female with significant past medical hx - HTN, DM, ESRD on HD, HLD, endocarditis of MV, hepatic encephalopathy, chrnoic anemia, cva currently admitted to inpt rehab for post-stroke rehabilitation - whom has had loose stool and abdominal cramps intermittently over last 5 days. She has history of constipation and has been on daily miralax for past couple of weeks. She denies ever having had colonoscopy. She has had intermittent "gas pains" for the last 5 days. She had abd XR performed yesterday that was interpreted as possible volvulus given gaseous distention of colon. A subsequent CT scan was then completed at 10am today. This showed mildly distended ascending colon up to 7.5 cm but read as normal cecum/TI. There was wall thickening and possible pneumatosis over this segment of colon as well. No evidence of free air or significant intraperitoneal free fluid. We were called and asked to see now.  She is resting comfortably in bed, taking her "afternoon nap" with her son at bedside as well. She reports feeling overall well. She reports intermittent abdominal cramps that occur a few times per day. She reports she is having 2-3 BMs per day and passing gas throughout the day. She denies blood in her stool and states its brown. She currently denies abdominal pain.  Her primary team has also reported significant improvement in her abdominal exam in the last 24 hours  Past Medical History:  Diagnosis Date  . Arthritis    hands  . Constipation   . Diabetes mellitus without complication (HCC)    Type II  . ESRD (end stage renal disease) ([place])     M/W/F- Hemodialysis  . GERD (gastroesophageal reflux disease)   . Headache   . Hyperlipidemia   . Hypertension   . Irregular heart rate   . Stroke [facility])    TIA - approx 2010- no residual    Past Surgical History:  Procedure Laterality Date  . ABDOMINAL  HYSTERECTOMY    . AORTIC ARCH ANGIOGRAPHY N/A 03/28/2019   Procedure: AORTIC ARCH ANGIOGRAPHY;  Surgeon: Waynetta Sandy, MD;  Location: [facility];  Service: Cardiovascular;  Laterality: N/A;  . AV FISTULA PLACEMENT Left 06/13/2018   Procedure: ARTERIOVENOUS (AV) FISTULA CREATION;  Surgeon: Rosetta Posner, MD;  Location: [facility] [facility];  Service: Vascular;  Laterality: Left;  . AV FISTULA PLACEMENT Left 08/18/2018   Procedure: INSERTION OF 4-7MM X 45CM ARTERIOVENOUS (AV) GORE-TEX GRAFT LEFT  FOREARM;  Surgeon: Rosetta Posner, MD;  Location: [place];  Service: Vascular;  Laterality: Left;  . AV FISTULA PLACEMENT Right 04/06/2019   Procedure: INSERTION OF ARTERIOVENOUS (AV) GORE-TEX GRAFT RIGHT UPPER ARM;  Surgeon: Waynetta Sandy, MD;  Location: [place];  Service: Vascular;  Laterality: Right;  . AV FISTULA PLACEMENT Right 04/13/2019   Procedure: INSERTION OF ARTERIOVENOUS (AV) BOVINE  ARTEGRAFT GRAFT RIGHT UPPER EXTREMITY;  Surgeon: Serafina Mitchell, MD;  Location: [place];  Service: Vascular;  Laterality: Right;  . [place] REMOVAL Right 05/16/2019   Procedure: REMOVAL OF ARTERIOVENOUS GORETEX GRAFT ([place]) RIGHT ARM;  Surgeon: Angelia Mould, MD;  Location: [place];  Service: Vascular;  Laterality: Right;  . BASCILIC VEIN TRANSPOSITION Right 03/07/2019   Procedure: First Stage Bascilic Vein Transposition Right Arm;  Surgeon: Serafina Mitchell, MD;  Location: [place];  Service: Vascular;  Laterality: Right;  . CATARACT EXTRACTION Right 2005  . INSERTION OF DIALYSIS CATHETER N/A  08/18/2018   Procedure: INSERTION OF DIALYSIS CATHETER;  Surgeon: Rosetta Posner, MD;  Location: [facility];  Service: Vascular;  Laterality: N/A;  . INSERTION OF DIALYSIS CATHETER Right 07/25/2019   Procedure: INSERTION OF DIALYSIS CATHETER RIGHT INTERNAL JUGULAR (TUNNELED);  Surgeon: Virl Cagey, MD;  Location: [facility];  Service: General;  Laterality: Right;  . IR FLUORO GUIDE CV LINE LEFT  10/26/2019  . IR FLUORO GUIDE  CV LINE RIGHT  12/06/2018  . IR PTA ADDL CENTRAL DIALYSIS SEG THRU DIALY CIRCUIT RIGHT Right 12/06/2018  . IR REMOVAL TUN CV CATH W/O FL  10/23/2019  . IR US GUIDE VASC ACCESS LEFT  10/26/2019  . TEE WITHOUT CARDIOVERSION N/A 10/26/2019   Procedure: TRANSESOPHAGEAL ECHOCARDIOGRAM (TEE);  Surgeon: Lelon Perla, MD;  Location: [place];  Service: Cardiovascular;  Laterality: N/A;  . THROMBECTOMY AND REVISION OF ARTERIOVENTOUS (AV) GORETEX  GRAFT Left 09/29/2018   Procedure: INSERTION OF ARTERIOVENTOUS (AV) GORETEX  GRAFT ARM;  Surgeon: Waynetta Sandy, MD;  Location: [place];  Service: Vascular;  Laterality: Left;  . UPPER EXTREMITY ANGIOGRAPHY Right 03/28/2019   Procedure: UPPER EXTREMITY ANGIOGRAPHY;  Surgeon: Waynetta Sandy, MD;  Location: [place];  Service: Cardiovascular;  Laterality: Right;  . VENOGRAM  10/27/2018   Procedure: Venogram;  Surgeon: Marty Heck, MD;  Location: [place] [facility];  Service: Cardiovascular;;  bilateral arm    Family History  Problem Relation Age of Onset  . Heart murmur Mother   . Heart failure Mother   . Diabetes Mother   . Cirrhosis Father   . Alcoholism Father     Social:  reports that she has never smoked. She has never used smokeless tobacco. She reports that she does not drink alcohol or use drugs.  Allergies: No Known Allergies  Medications: I have reviewed the patient's current medications.  Results for orders placed or performed during the hospital encounter of 11/01/19 (from the past 48 hour(s))  Glucose, capillary     Status: Abnormal   Collection Time: 11/14/19  4:55 PM  Result Value Ref Range   Glucose-Capillary 130 (H) 70 - 99 mg/dL  Glucose, capillary     Status: Abnormal   Collection Time: 11/14/19  8:47 PM  Result Value Ref Range   Glucose-Capillary 258 (H) 70 - 99 mg/dL  Glucose, capillary     Status: Abnormal   Collection Time: 11/15/19  6:19 AM  Result Value Ref Range    Glucose-Capillary 147 (H) 70 - 99 mg/dL  Protime-INR     Status: Abnormal   Collection Time: 11/15/19  7:18 AM  Result Value Ref Range   Prothrombin Time 50.5 (H) 11.4 - 15.2 seconds   INR 5.6 (HH) 0.8 - 1.2    Comment: REPEATED TO VERIFY CRITICAL RESULT CALLED TO, READ BACK BY AND VERIFIED WITH: Gardiner Fanti AT [id number] 11/15/2019 BY ZBEECH. (NOTE) INR goal varies based on device and disease states. Performed at [facility], [place] [address], [place], [place] [address]   Lipid panel     Status: Abnormal   Collection Time: 11/15/19  7:18 AM  Result Value Ref Range   Cholesterol 120 0 - 200 mg/dL   Triglycerides 177 (H) <150 mg/dL   HDL 28 (L) >40 mg/dL   Total CHOL/HDL Ratio 4.3 RATIO   VLDL 35 0 - 40 mg/dL   LDL Cholesterol 57 0 - 99 mg/dL    Comment:  Total Cholesterol/HDL:CHD Risk Coronary Heart Disease Risk Table                     Men   Women  1/2 Average Risk   3.4   3.3  Average Risk       5.0   4.4  2 X Average Risk   9.6   7.1  3 X Average Risk  23.4   11.0        Use the calculated Patient Ratio above and the CHD Risk Table to determine the patient's CHD Risk.        ATP III CLASSIFICATION (LDL):  <100     mg/dL   Optimal  100-129  mg/dL   Near or Above                    Optimal  130-159  mg/dL   Borderline  160-189  mg/dL   High  >190     mg/dL   Very High Performed at [facility] [address] [place], [place] [address]   Comprehensive metabolic panel     Status: Abnormal   Collection Time: 11/15/19 10:36 AM  Result Value Ref Range   Sodium 135 135 - 145 mmol/L   Potassium 3.8 3.5 - 5.1 mmol/L   Chloride 96 (L) 98 - 111 mmol/L   CO2 22 22 - 32 mmol/L   Glucose, Bld 138 (H) 70 - 99 mg/dL   BUN 35 (H) 8 - 23 mg/dL   Creatinine, Ser 6.38 (H) 0.44 - 1.00 mg/dL   Calcium 8.6 (L) 8.9 - 10.3 mg/dL   Total Protein 6.6 6.5 - 8.1 g/dL   Albumin 2.0 (L) 3.5 - 5.0 g/dL   AST 20 15 - 41 U/L   ALT 57 (H) 0 - 44 U/L   Alkaline  Phosphatase 111 38 - 126 U/L   Total Bilirubin 0.4 0.3 - 1.2 mg/dL   GFR calc non Af Amer 6 (L) >60 mL/min   GFR calc Af Amer 7 (L) >60 mL/min   Anion gap 17 (H) 5 - 15    Comment: Performed at [facility], [place] [address], [place], [place] [address]  CBC     Status: Abnormal   Collection Time: 11/15/19 10:36 AM  Result Value Ref Range   WBC 12.6 (H) 4.0 - 10.5 K/uL   RBC 2.96 (L) 3.87 - 5.11 MIL/uL   Hemoglobin 8.5 (L) 12.0 - 15.0 g/dL   HCT 26.3 (L) 36.0 - 46.0 %   MCV 88.9 80.0 - 100.0 fL   MCH 28.7 26.0 - 34.0 pg   MCHC 32.3 30.0 - 36.0 g/dL   RDW 17.2 (H) 11.5 - 15.5 %   Platelets 241 150 - 400 K/uL   nRBC 0.0 0.0 - 0.2 %    Comment: Performed at [facility], [place] [address], [place], [place] [address]  Ammonia     Status: None   Collection Time: 11/15/19 10:36 AM  Result Value Ref Range   Ammonia 33 9 - 35 umol/L    Comment: Performed at [facility], [place] [address] [place], [place] [address]  Glucose, capillary     Status: Abnormal   Collection Time: 11/15/19 11:40 AM  Result Value Ref Range   Glucose-Capillary 120 (H) 70 - 99 mg/dL  Glucose, capillary     Status: Abnormal   Collection Time: 11/15/19  5:40 PM  Result Value  Ref Range   Glucose-Capillary 108 (H) 70 - 99 mg/dL  Glucose, capillary     Status: Abnormal   Collection Time: 11/15/19  9:13 PM  Result Value Ref Range   Glucose-Capillary 129 (H) 70 - 99 mg/dL  Protime-INR     Status: Abnormal   Collection Time: 11/16/19  5:58 AM  Result Value Ref Range   Prothrombin Time 47.6 (H) 11.4 - 15.2 seconds    Comment: REPEATED TO VERIFY   INR 5.2 (HH) 0.8 - 1.2    Comment: REPEATED TO VERIFY CRITICAL RESULT CALLED TO, READ BACK BY AND VERIFIED WITH: A LEONAR,RN 11/16/2019 0748 WILDERK (NOTE) INR goal varies based on device and disease states. Performed at [facility], [place] [address], [place], [place] [address]   Glucose, capillary     Status: None   Collection Time: 11/16/19  6:07 AM   Result Value Ref Range   Glucose-Capillary 91 70 - 99 mg/dL  Glucose, capillary     Status: None   Collection Time: 11/16/19 12:11 PM  Result Value Ref Range   Glucose-Capillary 96 70 - 99 mg/dL    DG Pelvis 1-2 Views  Result Date: 11/15/2019 CLINICAL DATA:  Abdominal and pelvic pain for 1 day EXAM: PELVIS - 1-2 VIEW COMPARISON:  None. FINDINGS: There is marked colonic distention better detailed on dedicated abdominal radiograph. Few air distended loops of colon are noted in the pelvis. No acute osseous or soft tissue abnormality is seen. Extensive degenerative changes noted in the SI joints and both hips. The osseous structures appear diffusely demineralized which may limit detection of small or nondisplaced fractures. Atherosclerotic calcification of the inflow and proximal outflow arteries and numerous phleboliths are present in the pelvis. IMPRESSION: 1. Marked colonic distention, better detailed on dedicated abdominal radiograph. 2. The osseous structures appear diffusely demineralized which may limit detection of small or nondisplaced fractures. 3. No acute osseous or other soft tissue abnormality. Electronically Signed   By: Lovena Le M.D.   On: 11/15/2019 20:42   DG Abd 1 View  Result Date: 11/15/2019 CLINICAL DATA:  Generalized abdomen and pelvic pain for 1 day EXAM: ABDOMEN - 1 VIEW COMPARISON:  Radiograph 11/14/2019 FINDINGS: Increasing air distention of the colon. Some crescentic lucency is noted along the margins of a 1 of the distended colonic loops worrisome for developing pneumatosis. Additional segments of colon are air and stool filled. Small bowel gas pattern is largely obscured at this time. Multilevel degenerative changes noted in the spine hips and pelvis. Phleboliths in the pelvis. Atherosclerotic calcification of the iliac branches. IMPRESSION: Increasing air distention of the colon parents with an appearance worrisome for a volvulus now with crescentic mural lucency  suspicious for developing pneumatosis/possible pressure necrosis. A contrast-enhanced CT would provide optimal evaluation. Unenhanced CT could provide useful clinical information if unable to administer contrast media. These results will be called to the ordering clinician or representative by the Radiologist Assistant, and communication documented in the PACS or zVision Dashboard. Electronically Signed   By: Lovena Le M.D.   On: 11/15/2019 20:40   MR LUMBAR SPINE WO CONTRAST  Result Date: 11/16/2019 CLINICAL DATA:  Abnormal CT showing possible infection EXAM: MRI LUMBAR SPINE WITHOUT CONTRAST TECHNIQUE: Multiplanar, multisequence MR imaging of the lumbar spine was performed. No intravenous contrast was administered. COMPARISON:  08/23/2019 MRI, CT abdomen 11/16/2019 FINDINGS: Segmentation:  Standard. Alignment:  Stable Vertebrae: Diffusely abnormal marrow signal is again identified with marked heterogeneity. There is progression of abnormal  signal particularly at the L3 and L4 levels, noting progression of erosive changes on same day CT. Conus medullaris and cauda equina: Conus extends to the L1-L2 level. Conus and cauda equina appear normal. Paraspinal and other soft tissues: Edema within the left greater than right psoas muscles extending inferiorly from the L3 level. There is a 1 cm area of T2 hyperintensity extending laterally from the L3-L4 disc level into the psoas muscle. Disc levels: Congenital narrowing of the spinal canal. Stable appearance of L1-L2, L2-L3, L4-L5, and L5-S1. L3-L4: Disc bulge, endplate retropulsion, and facet arthropathy. Similar moderate canal stenosis with narrowing of lateral recesses. Increased effacement of neural foramina. IMPRESSION: Overall abnormal marrow signal is again likely due to renal osteodystrophy. However, progression of abnormal signal and endplate destruction at L3-L4 is suspicious for superimposed infection given new paraspinal edema and possible small  collection extending laterally from the L3-L4 disc level into the left psoas muscle. Electronically Signed   By: Macy Mis M.D.   On: 11/16/2019 12:42   CT ABDOMEN PELVIS W CONTRAST  Addendum Date: 11/16/2019   ADDENDUM REPORT: 11/16/2019 10:00 ADDENDUM: Critical Value/emergent results were called by telephone at the time of interpretation on 11/16/2019 at 10:00 am to Vance , who verbally acknowledged these results. Electronically Signed   By: Marin Olp M.D.   On: 11/16/2019 10:00   Result Date: 11/16/2019 CLINICAL DATA:  Abdominal distension. Suspect bowel obstruction. Possible pneumatosis on KUB. EXAM: CT ABDOMEN AND PELVIS WITH CONTRAST TECHNIQUE: Multidetector CT imaging of the abdomen and pelvis was performed using the standard protocol following bolus administration of intravenous contrast. CONTRAST:  48mL OMNIPAQUE IOHEXOL 300 MG/ML  SOLN COMPARISON:  09/10/2019 FINDINGS: Lower chest: Linear atelectasis over the posterior right lower lobe. Subtle posterior bibasilar atelectasis. Focal 9 mm stippled calcification over the posterior left lower lobe abutting the pleura. Evidence of patient's right IJ dialysis catheter with tip over the inferior cavoatrial junction. Mild calcification over the mitral valve annulus. Calcified plaque over the right coronary artery and minimally over the descending thoracic aorta. Hepatobiliary: Liver and gallbladder are normal. Possible tiny fleck of air over the porta hepatis as the biliary tree is otherwise unremarkable. Pancreas: Normal. Spleen: Normal. Adrenals/Urinary Tract: Adrenal glands are normal. Kidneys demonstrate no evidence of hydronephrosis or nephrolithiasis. Several left renal cysts unchanged. Bladder and ureters are unremarkable. Stomach/Bowel: Suggestion of mild wall thickening of the distal esophagus and stomach. Small bowel is normal caliber and otherwise unremarkable. Cecum is located over the midline lower abdomen. Appendix is  within normal. There is wall thickening over the ascending colon with findings suggesting pneumatosis over this segment of ascending colon. Ascending colon measures 7.5 cm in diameter. Cecum and terminal ileum are normal. No evidence of free peritoneal air. Vascular/Lymphatic: Moderate calcified plaque over the abdominal aorta which is otherwise normal in caliber. Calcified plaque over the superior mesenteric artery which is otherwise patent. No adenopathy. Reproductive: Previous hysterectomy. Other: Somewhat thickened bladder wall as the bladder is under distended. Small amount of free fluid in the pelvis. Musculoskeletal: Degenerative changes of the spine. There is a progressive destructive lytic process centered over the L3-4 disc space as findings are worse compared to the prior exam from 09/10/2019. Findings suggest an active process at this site which may be due to infection. This was previously evaluated by MRI 07/19/2019 and 08/23/2019 and thought to be stable at that point. Stable patchy lucency over L5 and the sacrum. Mild degenerative change of the hips. IMPRESSION: 1. Mildly  distended ascending colon with wall thickening and associated evidence of pneumatosis coli. No free peritoneal air. No evidence of small-bowel obstruction. No evidence of mesenteric thrombosis/occlusion. Findings may be due to mesenteric ischemia on or related to infectious colitis. 2. Progressive destruction of the L3 and L4 vertebral bodies centered over the disc space. This was thought to be stable on previous lumbar spine MRI August and October 2020, although has shown interval progression as infection is possible. 3. Mild wall thickening of the distal esophagus and stomach which may be infectious or inflammatory nature. 4.  Several left renal cysts unchanged. 5.  Aortic Atherosclerosis (ICD10-I70.0). Electronically Signed: By: Marin Olp M.D. On: 11/16/2019 09:20    ROS - all of the below systems have been reviewed with  the patient and positives are indicated with bold text General: chills, fever or night sweats Eyes: blurry vision or double vision ENT: epistaxis or sore throat Allergy/Immunology: itchy/watery eyes or nasal congestion Hematologic/Lymphatic: bleeding problems, blood clots or swollen lymph nodes Endocrine: temperature intolerance or unexpected weight changes Breast: new or changing breast lumps or nipple discharge Resp: cough, shortness of breath, or wheezing CV: chest pain or dyspnea on exertion GI: as per HPI GU: dysuria, trouble voiding, or hematuria MSK: joint pain or joint stiffness Neuro: TIA or stroke symptoms Derm: pruritus and skin lesion changes Psych: anxiety and depression  PE Blood pressure 113/71, pulse 89, temperature 99 F (37.2 C), resp. rate 18, height 5\' 7"  (1.702 m), weight 59.2 kg, SpO2 98 %. Constitutional: NAD; conversant; no deformities; resting comfortably in bed Eyes: Moist conjunctiva; no lid lag; anicteric; pupils equal and round Neck: Trachea midline; no thyromegaly Lungs: Normal respiratory effort; no tactile fremitus CV: RRR; no palpable thrills; no pitting edema GI: Abd soft, mildly ttp in right hemiabdomen; mildly distended; no palpable hepatosplenomegaly; no rebound; no guarding Psychiatric: Appropriate affect; alert and oriented x3 Lymphatic: No palpable cervical or axillary lymphadenopathy  Results for orders placed or performed during the hospital encounter of 11/01/19 (from the past 48 hour(s))  Glucose, capillary     Status: Abnormal   Collection Time: 11/14/19  4:55 PM  Result Value Ref Range   Glucose-Capillary 130 (H) 70 - 99 mg/dL  Glucose, capillary     Status: Abnormal   Collection Time: 11/14/19  8:47 PM  Result Value Ref Range   Glucose-Capillary 258 (H) 70 - 99 mg/dL  Glucose, capillary     Status: Abnormal   Collection Time: 11/15/19  6:19 AM  Result Value Ref Range   Glucose-Capillary 147 (H) 70 - 99 mg/dL  Protime-INR      Status: Abnormal   Collection Time: 11/15/19  7:18 AM  Result Value Ref Range   Prothrombin Time 50.5 (H) 11.4 - 15.2 seconds   INR 5.6 (HH) 0.8 - 1.2    Comment: REPEATED TO VERIFY CRITICAL RESULT CALLED TO, READ BACK BY AND VERIFIED WITH: Gardiner Fanti AT [id number] 11/15/2019 BY ZBEECH. (NOTE) INR goal varies based on device and disease states. Performed at [facility], [place] [address] [place], [place] [address]   Lipid panel     Status: Abnormal   Collection Time: 11/15/19  7:18 AM  Result Value Ref Range   Cholesterol 120 0 - 200 mg/dL   Triglycerides 177 (H) <150 mg/dL   HDL 28 (L) >40 mg/dL   Total CHOL/HDL Ratio 4.3 RATIO   VLDL 35 0 - 40 mg/dL   LDL Cholesterol 57 0 - 99 mg/dL  Comment:        Total Cholesterol/HDL:CHD Risk Coronary Heart Disease Risk Table                     Men   Women  1/2 Average Risk   3.4   3.3  Average Risk       5.0   4.4  2 X Average Risk   9.6   7.1  3 X Average Risk  23.4   11.0        Use the calculated Patient Ratio above and the CHD Risk Table to determine the patient's CHD Risk.        ATP III CLASSIFICATION (LDL):  <100     mg/dL   Optimal  100-129  mg/dL   Near or Above                    Optimal  130-159  mg/dL   Borderline  160-189  mg/dL   High  >190     mg/dL   Very High Performed at [facility] [address], [place], [place] [address]   Comprehensive metabolic panel     Status: Abnormal   Collection Time: 11/15/19 10:36 AM  Result Value Ref Range   Sodium 135 135 - 145 mmol/L   Potassium 3.8 3.5 - 5.1 mmol/L   Chloride 96 (L) 98 - 111 mmol/L   CO2 22 22 - 32 mmol/L   Glucose, Bld 138 (H) 70 - 99 mg/dL   BUN 35 (H) 8 - 23 mg/dL   Creatinine, Ser 6.38 (H) 0.44 - 1.00 mg/dL   Calcium 8.6 (L) 8.9 - 10.3 mg/dL   Total Protein 6.6 6.5 - 8.1 g/dL   Albumin 2.0 (L) 3.5 - 5.0 g/dL   AST 20 15 - 41 U/L   ALT 57 (H) 0 - 44 U/L   Alkaline Phosphatase 111 38 - 126 U/L   Total Bilirubin 0.4 0.3 - 1.2 mg/dL    GFR calc non Af Amer 6 (L) >60 mL/min   GFR calc Af Amer 7 (L) >60 mL/min   Anion gap 17 (H) 5 - 15    Comment: Performed at [facility], [place] [address], [place], [place] [address]  CBC     Status: Abnormal   Collection Time: 11/15/19 10:36 AM  Result Value Ref Range   WBC 12.6 (H) 4.0 - 10.5 K/uL   RBC 2.96 (L) 3.87 - 5.11 MIL/uL   Hemoglobin 8.5 (L) 12.0 - 15.0 g/dL   HCT 26.3 (L) 36.0 - 46.0 %   MCV 88.9 80.0 - 100.0 fL   MCH 28.7 26.0 - 34.0 pg   MCHC 32.3 30.0 - 36.0 g/dL   RDW 17.2 (H) 11.5 - 15.5 %   Platelets 241 150 - 400 K/uL   nRBC 0.0 0.0 - 0.2 %    Comment: Performed at [facility], [place] [address], [place], [place] [address]  Ammonia     Status: None   Collection Time: 11/15/19 10:36 AM  Result Value Ref Range   Ammonia 33 9 - 35 umol/L    Comment: Performed at [facility], [place] [address], [place], [place] [address]  Glucose, capillary     Status: Abnormal   Collection Time: 11/15/19 11:40 AM  Result Value Ref Range   Glucose-Capillary 120 (H) 70 - 99 mg/dL  Glucose, capillary     Status: Abnormal   Collection  Time: 11/15/19  5:40 PM  Result Value Ref Range   Glucose-Capillary 108 (H) 70 - 99 mg/dL  Glucose, capillary     Status: Abnormal   Collection Time: 11/15/19  9:13 PM  Result Value Ref Range   Glucose-Capillary 129 (H) 70 - 99 mg/dL  Protime-INR     Status: Abnormal   Collection Time: 11/16/19  5:58 AM  Result Value Ref Range   Prothrombin Time 47.6 (H) 11.4 - 15.2 seconds    Comment: REPEATED TO VERIFY   INR 5.2 (HH) 0.8 - 1.2    Comment: REPEATED TO VERIFY CRITICAL RESULT CALLED TO, READ BACK BY AND VERIFIED WITH: A LEONAR,RN 11/16/2019 0748 WILDERK (NOTE) INR goal varies based on device and disease states. Performed at [facility], [place] [address], [place],  [address]   Glucose, capillary     Status: None   Collection Time: 11/16/19  6:07 AM  Result Value Ref Range   Glucose-Capillary 91 70 - 99 mg/dL    Glucose, capillary     Status: None   Collection Time: 11/16/19 12:11 PM  Result Value Ref Range   Glucose-Capillary 96 70 - 99 mg/dL    DG Pelvis 1-2 Views  Result Date: 11/15/2019 CLINICAL DATA:  Abdominal and pelvic pain for 1 day EXAM: PELVIS - 1-2 VIEW COMPARISON:  None. FINDINGS: There is marked colonic distention better detailed on dedicated abdominal radiograph. Few air distended loops of colon are noted in the pelvis. No acute osseous or soft tissue abnormality is seen. Extensive degenerative changes noted in the SI joints and both hips. The osseous structures appear diffusely demineralized which may limit detection of small or nondisplaced fractures. Atherosclerotic calcification of the inflow and proximal outflow arteries and numerous phleboliths are present in the pelvis. IMPRESSION: 1. Marked colonic distention, better detailed on dedicated abdominal radiograph. 2. The osseous structures appear diffusely demineralized which may limit detection of small or nondisplaced fractures. 3. No acute osseous or other soft tissue abnormality. Electronically Signed   By: Lovena Le M.D.   On: 11/15/2019 20:42   DG Abd 1 View  Result Date: 11/15/2019 CLINICAL DATA:  Generalized abdomen and pelvic pain for 1 day EXAM: ABDOMEN - 1 VIEW COMPARISON:  Radiograph 11/14/2019 FINDINGS: Increasing air distention of the colon. Some crescentic lucency is noted along the margins of a 1 of the distended colonic loops worrisome for developing pneumatosis. Additional segments of colon are air and stool filled. Small bowel gas pattern is largely obscured at this time. Multilevel degenerative changes noted in the spine hips and pelvis. Phleboliths in the pelvis. Atherosclerotic calcification of the iliac branches. IMPRESSION: Increasing air distention of the colon parents with an appearance worrisome for a volvulus now with crescentic mural lucency suspicious for developing pneumatosis/possible pressure necrosis. A  contrast-enhanced CT would provide optimal evaluation. Unenhanced CT could provide useful clinical information if unable to administer contrast media. These results will be called to the ordering clinician or representative by the Radiologist Assistant, and communication documented in the PACS or zVision Dashboard. Electronically Signed   By: Lovena Le M.D.   On: 11/15/2019 20:40   MR LUMBAR SPINE WO CONTRAST  Result Date: 11/16/2019 CLINICAL DATA:  Abnormal CT showing possible infection EXAM: MRI LUMBAR SPINE WITHOUT CONTRAST TECHNIQUE: Multiplanar, multisequence MR imaging of the lumbar spine was performed. No intravenous contrast was administered. COMPARISON:  08/23/2019 MRI, CT abdomen 11/16/2019 FINDINGS: Segmentation:  Standard. Alignment:  Stable Vertebrae: Diffusely abnormal marrow signal is again  identified with marked heterogeneity. There is progression of abnormal signal particularly at the L3 and L4 levels, noting progression of erosive changes on same day CT. Conus medullaris and cauda equina: Conus extends to the L1-L2 level. Conus and cauda equina appear normal. Paraspinal and other soft tissues: Edema within the left greater than right psoas muscles extending inferiorly from the L3 level. There is a 1 cm area of T2 hyperintensity extending laterally from the L3-L4 disc level into the psoas muscle. Disc levels: Congenital narrowing of the spinal canal. Stable appearance of L1-L2, L2-L3, L4-L5, and L5-S1. L3-L4: Disc bulge, endplate retropulsion, and facet arthropathy. Similar moderate canal stenosis with narrowing of lateral recesses. Increased effacement of neural foramina. IMPRESSION: Overall abnormal marrow signal is again likely due to renal osteodystrophy. However, progression of abnormal signal and endplate destruction at L3-L4 is suspicious for superimposed infection given new paraspinal edema and possible small collection extending laterally from the L3-L4 disc level into the left  psoas muscle. Electronically Signed   By: Macy Mis M.D.   On: 11/16/2019 12:42   CT ABDOMEN PELVIS W CONTRAST  Addendum Date: 11/16/2019   ADDENDUM REPORT: 11/16/2019 10:00 ADDENDUM: Critical Value/emergent results were called by telephone at the time of interpretation on 11/16/2019 at 10:00 am to [place] , who verbally acknowledged these results. Electronically Signed   By: Marin Olp M.D.   On: 11/16/2019 10:00   Result Date: 11/16/2019 CLINICAL DATA:  Abdominal distension. Suspect bowel obstruction. Possible pneumatosis on KUB. EXAM: CT ABDOMEN AND PELVIS WITH CONTRAST TECHNIQUE: Multidetector CT imaging of the abdomen and pelvis was performed using the standard protocol following bolus administration of intravenous contrast. CONTRAST:  33mL OMNIPAQUE IOHEXOL 300 MG/ML  SOLN COMPARISON:  09/10/2019 FINDINGS: Lower chest: Linear atelectasis over the posterior right lower lobe. Subtle posterior bibasilar atelectasis. Focal 9 mm stippled calcification over the posterior left lower lobe abutting the pleura. Evidence of patient's right IJ dialysis catheter with tip over the inferior cavoatrial junction. Mild calcification over the mitral valve annulus. Calcified plaque over the right coronary artery and minimally over the descending thoracic aorta. Hepatobiliary: Liver and gallbladder are normal. Possible tiny fleck of air over the porta hepatis as the biliary tree is otherwise unremarkable. Pancreas: Normal. Spleen: Normal. Adrenals/Urinary Tract: Adrenal glands are normal. Kidneys demonstrate no evidence of hydronephrosis or nephrolithiasis. Several left renal cysts unchanged. Bladder and ureters are unremarkable. Stomach/Bowel: Suggestion of mild wall thickening of the distal esophagus and stomach. Small bowel is normal caliber and otherwise unremarkable. Cecum is located over the midline lower abdomen. Appendix is within normal. There is wall thickening over the ascending colon with  findings suggesting pneumatosis over this segment of ascending colon. Ascending colon measures 7.5 cm in diameter. Cecum and terminal ileum are normal. No evidence of free peritoneal air. Vascular/Lymphatic: Moderate calcified plaque over the abdominal aorta which is otherwise normal in caliber. Calcified plaque over the superior mesenteric artery which is otherwise patent. No adenopathy. Reproductive: Previous hysterectomy. Other: Somewhat thickened bladder wall as the bladder is under distended. Small amount of free fluid in the pelvis. Musculoskeletal: Degenerative changes of the spine. There is a progressive destructive lytic process centered over the L3-4 disc space as findings are worse compared to the prior exam from 09/10/2019. Findings suggest an active process at this site which may be due to infection. This was previously evaluated by MRI 07/19/2019 and 08/23/2019 and thought to be stable at that point. Stable patchy lucency over L5 and the sacrum.  Mild degenerative change of the hips. IMPRESSION: 1. Mildly distended ascending colon with wall thickening and associated evidence of pneumatosis coli. No free peritoneal air. No evidence of small-bowel obstruction. No evidence of mesenteric thrombosis/occlusion. Findings may be due to mesenteric ischemia on or related to infectious colitis. 2. Progressive destruction of the L3 and L4 vertebral bodies centered over the disc space. This was thought to be stable on previous lumbar spine MRI August and October 2020, although has shown interval progression as infection is possible. 3. Mild wall thickening of the distal esophagus and stomach which may be infectious or inflammatory nature. 4.  Several left renal cysts unchanged. 5.  Aortic Atherosclerosis (ICD10-I70.0). Electronically Signed: By: Marin Olp M.D. On: 11/16/2019 09:20   A/P: Dalores Shytle is an 68 y.o. female with HTN, DM, ESRD on HD, HLD, endocarditis of MV, hepatic encephalopathy, chrnoic  anemia, cva currently admitted to inpt rehab for post-stroke rehabilitation - we are asked to see in setting of ascending colon pneumatosis seen on CT  -Noted INR 5.2; albumin 2.0 -She is clinically doing reasonably well at this time and has had subjective improvement in her symptoms over the last 2 days. -Would continue broad spec IV abx - noted plans to continue til 12/06/2019 for MV endocarditis -Her CT suggests she could have underlying Ogilvie's given its dilation on the right and gradual tapering down in the left - which in setting of her chronic health issues wouldn't be surprising either. She has not had colonoscopy before; would be potentially both therapeutic and diagnostic for endoscopic evaluation of her colon and additionally help rule out distal obstruction before further Ogilvie's directed therapies -I had discussed all the above with both her and her son at bedside -Spine surgery seeing for for progressive destruction at L3/L4 vertebral bodies -We will follow with you   Sharon Mt. Dema Severin, M.D. [facility] Use [email or web address] to contact on call provider

## 2019-11-16 NOTE — Progress Notes (Signed)
Lab called PT 47.6; INR 5.2 Dan PA notified.

## 2019-11-16 NOTE — Progress Notes (Signed)
ANTICOAGULATION CONSULT NOTE - Follow-Up  Pharmacy Consult for Warfarin Indication: RIJ DVT (10/21/19), CVA (10/20/19)  No Known Allergies  Patient Measurements: Height: 5\' 7"  (170.2 cm) Weight: 130 lb 8.2 oz (59.2 kg) IBW/kg (Calculated) : 61.6  Vital Signs: Temp: 99 F (37.2 C) (12/24 0605) BP: 113/71 (12/24 0605) Pulse Rate: 89 (12/24 0605)  Labs: Recent Labs    11/13/19 1134 11/13/19 1248 11/14/19 0522 11/15/19 0718 11/15/19 1036 11/16/19 0558  HGB 9.1*  --   --   --  8.5*  --   HCT 28.4*  --   --   --  26.3*  --   PLT 199  --   --   --  241  --   LABPROT 48.5*  --  47.6* 50.5*  --  47.6*  INR 5.3*  --  5.2* 5.6*  --  5.2*  CREATININE  --  8.41*  --   --  6.38*  --     Estimated Creatinine Clearance: 7.8 mL/min (A) (by C-G formula based on SCr of 6.38 mg/dL (H)).  Assessment: 68 yr old female with ESRD who presented on 10/20/19 with new CVA and RIJ DVT 10/21/19. Pharmacy consulted to dose Warfarin.   INR continues remains elevated at 5.2 but is trending down. Warfarin has been held since 12/19. On 12/23 discussed with MD, no active bleeding, high clotting risk and low bleeding risk overall. Patient has poor appetite but is drinking some Ensure, which has small amounts (57mcg) of vitamin K. Discussed that the high INR is likely due to vitamin K deficiency from  poor PO intake. Given her clot much outweighs bleed risk, would prefer to not give vitamin K if possible. H/H & plt stable.  Goal of Therapy:  INR 2-3 Monitor platelets by anticoagulation protocol: Yes   Plan:  - HOLD warfarin x 1 day given supratherapeutic INR -Consider vitamin K 0.5mg  PO x1 if INR uptrends -Daily PT/INR, CBC Q Mon -Monitor for signs/symptoms of bleeding  Benetta Spar, PharmD, BCPS, BCCP Clinical Pharmacist  Please check AMION for all [place] phone numbers After 10:00 PM, call [place]

## 2019-11-16 NOTE — Progress Notes (Signed)
Patient scheduled for STAT CT scan. Contrast media delivered to floor at 0130. Patient took one sip and yelled no. Son at bedside also attempted to encourage patient with no success. Radiology notified of patient refusal, stated they would notify on call.

## 2019-11-16 NOTE — Progress Notes (Signed)
Patient not wanting to take morning meds til she eats; does not want to eat until son is here. NT offered meal this morning; claims she will wait for her son to feed her.

## 2019-11-16 NOTE — Progress Notes (Signed)
Occupational Therapy Session Note  Patient Details  Name: [name] MRN: [id number] Date of Birth: Aug 24, 1951  Today's Date: 11/16/2019 OT Individual Time: 1937-9024 OT Individual Time Calculation (min): 25 min    Short Term Goals: Week 1:  OT Short Term Goal 1 (Week 1): Pt will perform shower transfer with mod A. OT Short Term Goal 1 - Progress (Week 1): Progressing toward goal OT Short Term Goal 2 (Week 1): Pt will perform toileting with mod A. OT Short Term Goal 2 - Progress (Week 1): Progressing toward goal OT Short Term Goal 3 (Week 1): Pt will don back brace with min A overall. OT Short Term Goal 3 - Progress (Week 1): Not met OT Short Term Goal 4 (Week 1): Pt will perform LB dressing with max A overall. OT Short Term Goal 4 - Progress (Week 1): Met  Skilled Therapeutic Interventions/Progress Updates:    1:1. Pt received in bed with son present. Pt agreeable to dressing to make up for missed time from this morning. Pt dons shirt supported long sitting at bed level with A to pull down back. Pt dons pants and socks by crossing into long sitting figure 4 and rolling with CGA for OT to advance pants past hips with VC for spinal precautions. Pt supine>sitting EOB with A for trunk elevation and maintains static sitting balance while OT brushes hair at EOB with MIN-MOD A overall. Pt sits EOB 5 min prior to requesting to return to supine. Exited session with pt in bed, exit alarm on and call light in reach  Therapy Documentation Precautions:  Precautions Precautions: Fall Precaution Comments: Strong L lateral lean, TLSO for mobility Required Braces or Orthoses: Other Brace(TLSO) Spinal Brace: Thoracolumbosacral orthotic Spinal Brace Comments: for comfort with mobility Restrictions Weight Bearing Restrictions: No General: General OT Amount of Missed Time: 50 Minutes Vital Signs:   Pain:   ADL:   Vision   Perception    Praxis   Exercises:   Other Treatments:      Therapy/Group: Individual Therapy  Tonny Branch 11/16/2019, 1:30 PM

## 2019-11-16 NOTE — Progress Notes (Signed)
[facility] PHYSICAL MEDICINE & REHABILITATION PROGRESS NOTE   Subjective/Complaints: Patient seen laying in bed this morning.  She states she slept well overnight without sleep aid, confirmed with sleep chart.  She was seen by hospitalist yesterday, work-up ordered.  ROS: Denies CP, SOB, N/V/D  Objective:   DG Pelvis 1-2 Views  Result Date: 11/15/2019 CLINICAL DATA:  Abdominal and pelvic pain for 1 day EXAM: PELVIS - 1-2 VIEW COMPARISON:  None. FINDINGS: There is marked colonic distention better detailed on dedicated abdominal radiograph. Few air distended loops of colon are noted in the pelvis. No acute osseous or soft tissue abnormality is seen. Extensive degenerative changes noted in the SI joints and both hips. The osseous structures appear diffusely demineralized which may limit detection of small or nondisplaced fractures. Atherosclerotic calcification of the inflow and proximal outflow arteries and numerous phleboliths are present in the pelvis. IMPRESSION: 1. Marked colonic distention, better detailed on dedicated abdominal radiograph. 2. The osseous structures appear diffusely demineralized which may limit detection of small or nondisplaced fractures. 3. No acute osseous or other soft tissue abnormality. Electronically Signed   By: Lovena Le M.D.   On: 11/15/2019 20:42   DG Abd 1 View  Result Date: 11/15/2019 CLINICAL DATA:  Generalized abdomen and pelvic pain for 1 day EXAM: ABDOMEN - 1 VIEW COMPARISON:  Radiograph 11/14/2019 FINDINGS: Increasing air distention of the colon. Some crescentic lucency is noted along the margins of a 1 of the distended colonic loops worrisome for developing pneumatosis. Additional segments of colon are air and stool filled. Small bowel gas pattern is largely obscured at this time. Multilevel degenerative changes noted in the spine hips and pelvis. Phleboliths in the pelvis. Atherosclerotic calcification of the iliac branches. IMPRESSION: Increasing air  distention of the colon parents with an appearance worrisome for a volvulus now with crescentic mural lucency suspicious for developing pneumatosis/possible pressure necrosis. A contrast-enhanced CT would provide optimal evaluation. Unenhanced CT could provide useful clinical information if unable to administer contrast media. These results will be called to the ordering clinician or representative by the Radiologist Assistant, and communication documented in the PACS or zVision Dashboard. Electronically Signed   By: Lovena Le M.D.   On: 11/15/2019 20:40   Recent Labs    11/13/19 1134 11/15/19 1036  WBC 12.6* 12.6*  HGB 9.1* 8.5*  HCT 28.4* 26.3*  PLT 199 241   Recent Labs    11/13/19 1248 11/15/19 1036  NA 131* 135  K 4.4 3.8  CL 94* 96*  CO2 22 22  GLUCOSE 198* 138*  BUN 57* 35*  CREATININE 8.41* 6.38*  CALCIUM 8.3* 8.6*    Intake/Output Summary (Last 24 hours) at 11/16/2019 0917 Last data filed at 11/15/2019 1659 Gross per 24 hour  Intake 0 ml  Output 501 ml  Net -501 ml     Physical Exam: Vital Signs Blood pressure 113/71, pulse 89, temperature 99 F (37.2 C), resp. rate 18, height 5\' 7"  (1.702 m), weight 59.2 kg, SpO2 98 %. Constitutional: No distress . Vital signs reviewed. HENT: Normocephalic.  Atraumatic. Eyes: EOMI. No discharge. Cardiovascular: No JVD. Respiratory: Normal effort.  No stridor. GI: Non-distended. Skin: Sacral ulcer not examined today Psych: Flat. Musc: No edema in extremities.  No tenderness in extremities. Neurological: Alert and oriented x3 Motor: RUE: 5/5 proximal to distal RLE: HF, KE 3+/5, ADF 4/5, improving LUE: 4+/5 proximal to distal LLE: HF, KE 3/5, ADF 3+/5, improving  Assessment/Plan: 1. Functional deficits secondary to right  basal ganglia stroke which require 3+ hours per day of interdisciplinary therapy in a comprehensive inpatient rehab setting.  Physiatrist is providing close team supervision and 24 hour management of  active medical problems listed below.  Physiatrist and rehab team continue to assess barriers to discharge/monitor patient progress toward functional and medical goals  Care Tool:  Bathing    Body parts bathed by patient: Right arm, Left arm, Chest, Abdomen, Front perineal area, Right upper leg, Left upper leg, Face   Body parts bathed by helper: Front perineal area, Buttocks, Right upper leg, Left upper leg, Right lower leg, Left lower leg, Face     Bathing assist Assist Level: Total Assistance - Patient < 25%     Upper Body Dressing/Undressing Upper body dressing   What is the patient wearing?: Pull over shirt    Upper body assist Assist Level: Minimal Assistance - Patient > 75%    Lower Body Dressing/Undressing Lower body dressing      What is the patient wearing?: Pants, Incontinence brief     Lower body assist Assist for lower body dressing: Total Assistance - Patient < 25%     Toileting Toileting    Toileting assist Assist for toileting: Maximal Assistance - Patient 25 - 49%     Transfers Chair/bed transfer  Transfers assist     Chair/bed transfer assist level: Dependent - mechanical lift     Locomotion Ambulation   Ambulation assist   Ambulation activity did not occur: Safety/medical concerns(back pain, bilateral LE weakness, poor bilateral knee control)  Assist level: 2 helpers Assistive device: Walker-rolling Max distance: 88ft   Walk 10 feet activity   Assist  Walk 10 feet activity did not occur: Safety/medical concerns(back pain, bilateral LE weakness, poor bilateral knee control)  Assist level: 2 helpers Assistive device: Walker-rolling   Walk 50 feet activity   Assist Walk 50 feet with 2 turns activity did not occur: Safety/medical concerns(back pain, bilateral LE weakness, poor bilateral knee control)  Assist level: 2 helpers Assistive device: Walker-rolling    Walk 150 feet activity   Assist Walk 150 feet activity did not  occur: Safety/medical concerns(back pain, bilateral LE weakness, poor bilateral knee control)         Walk 10 feet on uneven surface  activity   Assist Walk 10 feet on uneven surfaces activity did not occur: Safety/medical concerns(back pain, bilateral LE weakness, poor bilateral knee control)         Wheelchair     Assist Will patient use wheelchair at discharge?: Yes Type of Wheelchair: Manual Wheelchair activity did not occur: Safety/medical concerns(Pt could not tolerate sitting in WC for long enough to complete activity due to low back pain)  Wheelchair assist level: Minimal Assistance - Patient > 75% Max wheelchair distance: 82ft    Wheelchair 50 feet with 2 turns activity    Assist    Wheelchair 50 feet with 2 turns activity did not occur: Safety/medical concerns(Pt could not tolerate sitting in WC for long enough to complete activity due to low back pain)       Wheelchair 150 feet activity     Assist  Wheelchair 150 feet activity did not occur: Safety/medical concerns(Pt could not tolerate sitting in [facility] for long enough to complete activity due to low back pain)       Blood pressure 113/71, pulse 89, temperature 99 F (37.2 C), resp. rate 18, height 5\' 7"  (1.702 m), weight 59.2 kg, SpO2 98 %.    Medical  Problem List and Plan: 1.  Impaired gait with left side weakness secondary to right basal ganglia infarction as well as small cortical infarct left frontal lobe, now with delirium  Continue bedrest  CT head on 12/13 personally reviewed, showing some improvement, MRI personally reviewed, stable. Repeat MRI ordered again by ID, showing stable infarcts  Chest x-ray personally reviewed, unremarkable for infection  Discussed with neurology results of the EEG -findings possibly related to uremia +/- cefepime  Discussed with psychiatry, continue medical management  Discussed with hospitalist, appreciate recs  DG abdomen/pelvis ordered yesterday,  suggesting?  Volvulus.  CT abdomen/pelvis ordered-pending 2.  Antithrombotics: -DVT/anticoagulation: Chronic Coumadin for right IJ acute DVT.  Plan anticoagulation x3 months  INR critical value of 5.2- will consider Vit K, however, need to be weary given recent clot             -antiplatelet therapy: N/A 3. Pain Management chronic back and left hip pain: Neurontin 100 mg on hold  TLSO back brace for comfort 4. Mood: Provide emotional support             -antipsychotic agents: N/A 5. Neuropsych: This patient is not capable of making decisions on her own behalf. 6. Skin/Wound Care: Routine skin checks  Santyl to sacral wound 7. Fluids/Electrolytes/Nutrition: Routine in and outs 8.  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  New HD tunneled catheter placed 10/26/2019. 9.  ID/recurrent Pseudomonas bacteremia.    Continue Maxipime as directed with infectious disease to discuss duration of antibiotic therapies anticipate 6 weeks.  Cipro DC'd by ID due to seizure risk  Per ID, no changes in medications, of note, pt did NOT seizures 10.  Hemodialysis MWF.  Follow-up per renal services.  11.  Diabetes mellitus.  Hemoglobin A1c 7.6.  SSI. CBG (last 3)  Recent Labs    11/15/19 1740 11/15/19 2113 11/16/19 0607  GLUCAP 108* 129* 91   Relatively controlled on 12/24 12.  Hypertension.  Imdur 30 mg daily  Coreg 6.25 mg twice daily, increased to 12.5 on 12/15, parameters placed.    Monitor with increased mobility.  Relatively controlled on 12/24 13.  Acute on chronic anemia.    Hemoglobin 9.1 on 12/21  Continue to monitor 14. Constipation:  See #16  Abd xray showing?  Volvulus  CT abdomen/pelvis pending 15.  Candidiasis  Diflucan ordered on 12/16  Repeat Ucx not collected yet on 12/24 16.  Hyperammonemia with hepatic encephalopathy  Lactulose started on 12/17, encourage compliance  Ammonia WNL on 12/22 17.  Sleep disturance  Melatonin added on 12/17  Sleep chart ordered  ?  Improving 18.  Leukocytosis  WBCs 12.6 on 12/21, labs with HD  Afebrile  Continue to monitor 19.  GERD  Home Protonix ordered on 12/20   LOS: 15 days A FACE TO FACE EVALUATION WAS PERFORMED  Kenzy Campoverde Lorie Phenix 11/16/2019, 9:17 AM

## 2019-11-16 NOTE — Progress Notes (Addendum)
PROGRESS NOTE    [name]  [id number] DOB: 1951/04/27 DOA: 11/01/2019 PCP: Manon Hilding, MD   Brief Narrative: [name] is a 68 y.o. female with a history of ESRD on HD, mitral valve endocarditis, hepatic encephalopathy, chronic anemia, diabetes mellitus, CVA. Patient is currently admitted to inpatient rehab for post-CVA rehabilitation and was found to have worsening mentation.   Assessment & Plan:   Principal Problem:   Cerebrovascular accident (CVA) of right basal ganglia (HCC) Active Problems:   Slow transit constipation   Acute on chronic anemia   Labile blood pressure   Labile blood glucose   Diabetes mellitus type 2 in nonobese (HCC)   Bacteremia   Endocarditis of mitral valve   Septic embolism (HCC)   Seizure (HCC)   Candidiasis   Encephalopathy, hepatic (HCC)   Hyperammonemia (HCC)   Sleep disturbance   Hemodialysis-associated hypotension   Supratherapeutic INR   Gastroesophageal reflux disease   Decubitus ulcer of sacral region, unstageable ([place])   Encephalopathy Unknown etiology. Thought possibly secondary to delirium. Appears to be improved today but just somnolent possibly secondary to Seroquel overnight. Appears to be resolved. -Continue lactulose  Diabetes mellitus, type 2 Patient is on insulin NPH-regular (70-30) 4 units BID WC as an outpatient. Currently on SSI. Blood sugar somewhat controlled. -Continue SSI for now  Hyperammonemia History of hepatic encephalopathy -Continue lactulose  Abdominal pain Initially with symptoms on 12/23. Abdominal X-ray concerning for volvulus -Awaiting stat CT, although exam this morning is thankfully not as concerning  Pelvic pain Unknown etiology. No trauma noted. Patient does have a history of sacral decubitus ulcer. Afebrile and normal WBC. Pelvic x-ray not suggestive of fracture  Addendum: MRI lumbar spine obtained and concerning for possible infectious process in setting of endocarditis  and correlating pain. Discussed with neurosurgery and will re-consult ID in AM for possible consideration of antibiotic changes and/or IR aspiration  Coagulopathy Secondary to Coumadin. INR appears to have reached plateau . No obvious source of bleeding. Hemoglobin stable. -Watch INR and watch for evidence of bleeding  Right IJ DVT Acute from hospitalization last month. Started on Coumadin.  Mitral valve endocarditis Seen by cardiothoracic surgery on medical admission. No plan for replacement valve at this acute period but will need follow-up as an outpatient. On Cefepime per infectious disease with last recommended dose on 12/06/2019.  Essential hypertension -Continue Imdur  CVA Recently hospitalized and is now undergoing inpatient rehabilitation. Per stroke team, does not appear aspirin was considered for continuation on discharge. -Continue Coumadin  Pressure injury Medial sacrum, not POA    DVT prophylaxis: Coumadin (currently held secondary to supertherapeutic INR) Code Status:   Code Status: DNR Family Communication: None at bedside   Procedures:   HD  Antimicrobials:  Cefepime    Subjective: Back pain. No abdominal pain, nausea, vomiting. Passing gas and had a small bowel movement last night.  Objective: Vitals:   11/15/19 1605 11/15/19 1659 11/15/19 2018 11/16/19 0605  BP: (!) 87/60 126/69 104/63 113/71  Pulse: 95 (!) 102 95 89  Resp:  18 16 18   Temp:  98.5 F (36.9 C) 98.8 F (37.1 C) 99 F (37.2 C)  TempSrc:  Oral    SpO2:  96% 97% 98%  Weight:  59.2 kg    Height:        Intake/Output Summary (Last 24 hours) at 11/16/2019 0757 Last data filed at 11/15/2019 1659 Gross per 24 hour  Intake 0 ml  Output 501 ml  Net -501  ml   Filed Weights   11/14/19 0326 11/15/19 1318 11/15/19 1659  Weight: 59.9 kg 60 kg 59.2 kg    Examination:  General exam: Appears calm and comfortable Respiratory system: Clear to auscultation. Respiratory effort  normal. Cardiovascular system: S1 & S2 heard, RRR. No murmurs, rubs, gallops or clicks. Gastrointestinal system: Abdomen is nondistended, soft and nontender. Normal bowel sounds heard. Central nervous system: Alert and oriented. No focal neurological deficits. Extremities: No edema. No calf tenderness Skin: No cyanosis. No rashes Psychiatry: Judgement and insight appear normal. Mood & affect appropriate.      Data Reviewed: I have personally reviewed following labs and imaging studies  CBC: Recent Labs  Lab 11/10/19 1405 11/10/19 1759 11/13/19 1134 11/15/19 1036  WBC 10.0 12.2* 12.6* 12.6*  HGB 9.4* 10.8* 9.1* 8.5*  HCT 30.1* 34.0* 28.4* 26.3*  MCV 94.4 91.9 91.0 88.9  PLT 215 209 199 [id number]   Basic Metabolic Panel: Recent Labs  Lab 11/10/19 1405 11/10/19 1759 11/13/19 1248 11/15/19 1036  NA 136 134* 131* 135  K 3.9 3.3* 4.4 3.8  CL 95* 93* 94* 96*  CO2 26 27 22 22   GLUCOSE 168* 173* 198* 138*  BUN 33* 9 57* 35*  CREATININE 7.14* 3.17* 8.41* 6.38*  CALCIUM 8.7* 8.5* 8.3* 8.6*  PHOS 3.5 2.2* 5.2*  --    GFR: Estimated Creatinine Clearance: 7.8 mL/min (A) (by C-G formula based on SCr of 6.38 mg/dL (H)). Liver Function Tests: Recent Labs  Lab 11/10/19 1405 11/10/19 1759 11/13/19 1248 11/15/19 1036  AST  --   --   --  20  ALT  --   --   --  57*  ALKPHOS  --   --   --  111  BILITOT  --   --   --  0.4  PROT  --   --   --  6.6  ALBUMIN 2.2* 2.4* 1.7* 2.0*   No results for input(s): LIPASE, AMYLASE in the last 168 hours. Recent Labs  Lab 11/14/19 0522 11/15/19 1036  AMMONIA 25 33   Coagulation Profile: Recent Labs  Lab 11/12/19 0822 11/13/19 1134 11/14/19 0522 11/15/19 0718 11/16/19 0558  INR 4.8* 5.3* 5.2* 5.6* 5.2*   Cardiac Enzymes: No results for input(s): CKTOTAL, CKMB, CKMBINDEX, TROPONINI in the last 168 hours. BNP (last 3 results) No results for input(s): PROBNP in the last 8760 hours. HbA1C: No results for input(s): HGBA1C in the last 72  hours. CBG: Recent Labs  Lab 11/15/19 0619 11/15/19 1140 11/15/19 1740 11/15/19 2113 11/16/19 0607  GLUCAP 147* 120* 108* 129* 91   Lipid Profile: Recent Labs    11/15/19 0718  CHOL 120  HDL 28*  LDLCALC 57  TRIG 177*  CHOLHDL 4.3   Thyroid Function Tests: No results for input(s): TSH, T4TOTAL, FREET4, T3FREE, THYROIDAB in the last 72 hours. Anemia Panel: No results for input(s): VITAMINB12, FOLATE, FERRITIN, TIBC, IRON, RETICCTPCT in the last 72 hours. Sepsis Labs: No results for input(s): PROCALCITON, LATICACIDVEN in the last 168 hours.  Recent Results (from the past 240 hour(s))  Culture, Urine     Status: Abnormal   Collection Time: 11/06/19 11:07 AM   Specimen: Urine, Random  Result Value Ref Range Status   Specimen Description URINE, RANDOM  Final   Special Requests   Final    NONE Performed at [facility], [address] [place], [place] [address]    Culture 80,000 COLONIES/mL YEAST (A)  Final   Report  Status 11/07/2019 FINAL  Final         Radiology Studies: DG Pelvis 1-2 Views  Result Date: 11/15/2019 CLINICAL DATA:  Abdominal and pelvic pain for 1 day EXAM: PELVIS - 1-2 VIEW COMPARISON:  None. FINDINGS: There is marked colonic distention better detailed on dedicated abdominal radiograph. Few air distended loops of colon are noted in the pelvis. No acute osseous or soft tissue abnormality is seen. Extensive degenerative changes noted in the SI joints and both hips. The osseous structures appear diffusely demineralized which may limit detection of small or nondisplaced fractures. Atherosclerotic calcification of the inflow and proximal outflow arteries and numerous phleboliths are present in the pelvis. IMPRESSION: 1. Marked colonic distention, better detailed on dedicated abdominal radiograph. 2. The osseous structures appear diffusely demineralized which may limit detection of small or nondisplaced fractures. 3. No acute osseous or other soft tissue  abnormality. Electronically Signed   By: Lovena Le M.D.   On: 11/15/2019 20:42   DG Abd 1 View  Result Date: 11/15/2019 CLINICAL DATA:  Generalized abdomen and pelvic pain for 1 day EXAM: ABDOMEN - 1 VIEW COMPARISON:  Radiograph 11/14/2019 FINDINGS: Increasing air distention of the colon. Some crescentic lucency is noted along the margins of a 1 of the distended colonic loops worrisome for developing pneumatosis. Additional segments of colon are air and stool filled. Small bowel gas pattern is largely obscured at this time. Multilevel degenerative changes noted in the spine hips and pelvis. Phleboliths in the pelvis. Atherosclerotic calcification of the iliac branches. IMPRESSION: Increasing air distention of the colon parents with an appearance worrisome for a volvulus now with crescentic mural lucency suspicious for developing pneumatosis/possible pressure necrosis. A contrast-enhanced CT would provide optimal evaluation. Unenhanced CT could provide useful clinical information if unable to administer contrast media. These results will be called to the ordering clinician or representative by the Radiologist Assistant, and communication documented in the PACS or zVision Dashboard. Electronically Signed   By: Lovena Le M.D.   On: 11/15/2019 20:40   MR BRAIN WO CONTRAST  Result Date: 11/14/2019 CLINICAL DATA:  Septic arterial embolism. Native endocarditis of the mitral valve with concern for CNS disease. EXAM: MRI HEAD WITHOUT CONTRAST TECHNIQUE: Multiplanar, multiecho pulse sequences of the brain and surrounding structures were obtained without intravenous contrast. COMPARISON:  Eight days ago FINDINGS: Brain: Subacute to chronic infarcts in the bilateral corona radiata and centrum semiovale. Chronic infarct in the right caudate head. Chronic small left inferior cerebellar infarcts. No adverse features or liquefaction. No acute or interval infarct. No hemorrhage, hydrocephalus, or collection.  Vascular: Normal flow voids Skull and upper cervical spine: Generally hypointense marrow signal which could be from patient's end-stage renal disease or anemia Sinuses/Orbits: Negative IMPRESSION: Stable non-acute infarcts.  No evidence of abscess or acute process. Electronically Signed   By: Monte Fantasia M.D.   On: 11/14/2019 09:23        Scheduled Meds:  acetaminophen  500 mg Oral TID   calcium carbonate  1 tablet Oral 2 times per day   carvedilol  12.5 mg Oral BID   Chlorhexidine Gluconate Cloth  6 each Topical Q0600   darbepoetin (ARANESP) injection - DIALYSIS  60 mcg Intravenous Q Mon-HD   doxercalciferol  1.5 mcg Intravenous Q M,W,F-HD   feeding supplement (ENSURE ENLIVE)  237 mL Oral BID BM   insulin aspart  0-9 Units Subcutaneous TID WC   isosorbide mononitrate  30 mg Oral QPC lunch   lactulose  20 g  Oral TID   Melatonin  3 mg Oral QHS   multivitamin  1 tablet Oral QPC lunch   pantoprazole  40 mg Oral Daily   Warfarin - Pharmacist Dosing Inpatient   Does not apply q1800   Continuous Infusions:  sodium chloride     sodium chloride     ceFEPime (MAXIPIME) IV 1 g (11/15/19 2129)     LOS: 15 days     Cordelia Poche, MD [facility] 11/16/2019, 7:56 AM  If 7PM-7AM, please contact night-coverage [email or web address]

## 2019-11-16 NOTE — Progress Notes (Signed)
Physical Therapy Session Note  Patient Details  Name: [name] MRN: [id number] Date of Birth: Nov 09, 1951  Today's Date: 11/16/2019 PT Individual Time: 0930-1029 PT Individual Time Calculation (min): 59 min   Short Term Goals: Week 2:  PT Short Term Goal 1 (Week 2): Pt will transfer supine<>sit and sit<>supine with min A PT Short Term Goal 1 - Progress (Week 2): Not progressing PT Short Term Goal 2 (Week 2): Pt will transfer stand<>pivot with LRAD mod A PT Short Term Goal 2 - Progress (Week 2): Not progressing PT Short Term Goal 3 (Week 2): Pt will perform car transfer with LRAD mod A PT Short Term Goal 3 - Progress (Week 2): Not progressing Week 3:  PT Short Term Goal 1 (Week 3): Pt will transfer supine<>sit and sit<>supine with min A PT Short Term Goal 2 (Week 3): Pt will transfer stand<>pivot with LRAD mod A PT Short Term Goal 3 (Week 3): Pt will perform car transfer with LRAD mod A  Skilled Therapeutic Interventions/Progress Updates:   Received pt supine in bed, pt agreeable to therapy and stated her back hurts from laying in the bed. Pt still on bedrest however, therapist suggested supine and seated exercises to improve back pain; pt agreed. Session focused on functional mobility, bed mobility, LE strengthening, dynamic sitting balance, postural control and strength, and improved tolerance to activity. Pt rolled to L and R with min A and use of bedrails. Pt transferred supine<>sit mod A and required mod/min A for seated trunk control. Pt performed horseshoe toss x 1 trial while seated EOB with emphasis on postural control, alignment, and stability and UE ROM. Pt reported increased L hip pain and requested to lie back down. Sit<>supine mod A. Therapist performed PROM in hip flexion, add, and abd and gentle oscillations to L hip. Pt reported decreased pain and thanked therapist. Pt agreeable to attempt to play horseshoes again. Supine<>sit EOB mod A and min A for trunk control. Pt  performed seated horseshoe toss x trial 2 but reported increased back pain and requested to lay down. Sit<>supine mod A. Pt requested therapist apply lotion/chapstick for dry lips; therapist assisted pt in applying lotion. NT arrived stating pt had not eaten all morning and requesting therapy to assist. Therapist encouraged pt to eat sitting EOB however pt refused multiple times stating "I'm tired of eating the same thing over and over". Finally pt stated that the only way she would eat was laying in bed. Therapist assisted with repositioning and pt able to eat a few bites of bacon and potatoes with set up assist and encouragement to eat. Pt refused to eat any more and c/o increased pain on buttocks. Therapist educated pt on importance of mobility and pressure relief techniques to reduce pain. Therapist suggested LE strengthening in bed however pt refused. Pt requested therapist assist to call sister [name]. Therapist called [name] and spoke with [name] regarding pt's participation in therapy and bedrest restriction. After phone call with [name], pt more encouraged to participate in therapy and agreed to exercises. Pt performed pelvic tilts 2x8 with verbal cues for technique, bilateral SLR x 8, hip adduction pillow squeezes x8, and bilateral sidelying clamshells 2x8. Pt requested therapist assist in calling son [name]. Therapist assisted however son did not answer. Pt in distress stating "my son isn't coming". Therapist assisted in texting son and son responded stating he was on the way to the hospital. Therapist reassured pt that son was coming, however pt not receptive. Pt requested to rest  due to increased fatigue. Therapist assisted with repositioning and pressure relief using pillows. Concluded session with pt supine in bed, needs within reach, and bed alarm not on due to new air bed, however rails up.   Therapy Documentation Precautions:  Precautions Precautions: Fall Precaution Comments: Strong L lateral lean,  TLSO for mobility Required Braces or Orthoses: Other Brace(TLSO) Spinal Brace: Thoracolumbosacral orthotic Spinal Brace Comments: for comfort with mobility Restrictions Weight Bearing Restrictions: No  Therapy/Group: Individual Therapy Alfonse Alpers PT, DPT   11/16/2019, 7:37 AM

## 2019-11-16 NOTE — Progress Notes (Signed)
Occupational Therapy Session Note  Patient Details  Name: [name] MRN: [id number] Date of Birth: 1951/02/07  Today's Date: 11/16/2019 OT Individual Time: 1100-1110 OT Individual Time Calculation (min): 10 min  and Today's Date: 11/16/2019 OT Missed Time:   50 min Missed Time Reason:   imaging   Short Term Goals: Week 1:  OT Short Term Goal 1 (Week 1): Pt will perform shower transfer with mod A. OT Short Term Goal 1 - Progress (Week 1): Progressing toward goal OT Short Term Goal 2 (Week 1): Pt will perform toileting with mod A. OT Short Term Goal 2 - Progress (Week 1): Progressing toward goal OT Short Term Goal 3 (Week 1): Pt will don back brace with min A overall. OT Short Term Goal 3 - Progress (Week 1): Not met OT Short Term Goal 4 (Week 1): Pt will perform LB dressing with max A overall. OT Short Term Goal 4 - Progress (Week 1): Met  Skilled Therapeutic Interventions/Progress Updates:    1;1. Pt received in bed agreeable to bed level OT as supposed to be taken to imaging. No pain reported. Pt requires increased time to remove coveres with HOH A d/t ataxia. With bed in trendelenburg OT educates on scooting to [facility] using BLE to push through bed. Pt able to scoot up with 5 scoots and rest break in between. Transport arrived to take pt to MRI and misses 50 min skilled OT.  Therapy Documentation Precautions:  Precautions Precautions: Fall Precaution Comments: Strong L lateral lean, TLSO for mobility Required Braces or Orthoses: Other Brace(TLSO) Spinal Brace: Thoracolumbosacral orthotic Spinal Brace Comments: for comfort with mobility Restrictions Weight Bearing Restrictions: No General:   Vital Signs:   Pain: Pain Assessment Pain Scale: Faces Faces Pain Scale: Hurts a little bit Pain Type: Acute pain Pain Location: Back Pain Orientation: Lower Pain Descriptors / Indicators: Grimacing Pain Onset: Other (Comment)(with raising head of bed) Pain Intervention(s):  Repositioned Multiple Pain Sites: No ADL:   Vision   Perception    Praxis   Exercises:   Other Treatments:     Therapy/Group: Individual Therapy  Tonny Branch 11/16/2019, 11:13 AM

## 2019-11-16 NOTE — Progress Notes (Deleted)
PROGRESS NOTE    [name]  [id number] DOB: 11-Jun-1951 DOA: 11/01/2019 PCP: Manon Hilding, MD   Brief Narrative: [name] is a 68 y.o. female with a history of ESRD on HD, mitral valve endocarditis, hepatic encephalopathy, chronic anemia, diabetes mellitus, CVA. Patient is currently admitted to inpatient rehab for post-CVA rehabilitation and was found to have worsening mentation.   Assessment & Plan:   Principal Problem:   Cerebrovascular accident (CVA) of right basal ganglia (HCC) Active Problems:   Slow transit constipation   Acute on chronic anemia   Labile blood pressure   Labile blood glucose   Diabetes mellitus type 2 in nonobese (HCC)   Bacteremia   Endocarditis of mitral valve   Septic embolism (HCC)   Seizure (HCC)   Candidiasis   Encephalopathy, hepatic (HCC)   Hyperammonemia (HCC)   Sleep disturbance   Hemodialysis-associated hypotension   Supratherapeutic INR   Gastroesophageal reflux disease   Decubitus ulcer of sacral region, unstageable ([place])   Encephalopathy Unknown etiology. Thought possibly secondary to delirium. Appears to be improved today but just somnolent possibly secondary to Seroquel overnight. Appears to be resolved. -Continue lactulose  Diabetes mellitus, type 2 Patient is on insulin NPH-regular (70-30) 4 units BID WC as an outpatient. Currently on SSI. Blood sugar somewhat controlled. -Continue SSI for now  Hyperammonemia History of hepatic encephalopathy -Continue lactulose  Abdominal pain Initially with symptoms on 12/23. Abdominal X-ray concerning for volvulus -Awaiting stat CT, although exam this morning is thankfully not as concerning  Pelvic pain Unknown etiology. No trauma noted. Patient does have a history of sacral decubitus ulcer. Afebrile and normal WBC. Pelvic x-ray not suggestive of fracture  Addendum: MRI lumbar spine obtained and concerning for possible infectious process in setting of endocarditis  and correlating pain. Discussed with neurosurgery and will re-consult ID in AM for possible consideration of antibiotic changes and/or IR aspiration  Coagulopathy Secondary to Coumadin. INR appears to have reached plateau . No obvious source of bleeding. Hemoglobin stable. -Watch INR and watch for evidence of bleeding  Right IJ DVT Acute from hospitalization last month. Started on Coumadin.  Mitral valve endocarditis Seen by cardiothoracic surgery on medical admission. No plan for replacement valve at this acute period but will need follow-up as an outpatient. On Cefepime per infectious disease with last recommended dose on 12/06/2019.  Essential hypertension -Continue Imdur  CVA Recently hospitalized and is now undergoing inpatient rehabilitation. Per stroke team, does not appear aspirin was considered for continuation on discharge. -Continue Coumadin  Pressure injury Medial sacrum, not POA    DVT prophylaxis: Coumadin (currently held secondary to supertherapeutic INR) Code Status:   Code Status: DNR Family Communication: None at bedside   Procedures:   HD  Antimicrobials:  Cefepime    Subjective: Back pain. No abdominal pain, nausea, vomiting. Passing gas and had a small bowel movement last night.  Objective: Vitals:   11/15/19 1605 11/15/19 1659 11/15/19 2018 11/16/19 0605  BP: (!) 87/60 126/69 104/63 113/71  Pulse: 95 (!) 102 95 89  Resp:  18 16 18   Temp:  98.5 F (36.9 C) 98.8 F (37.1 C) 99 F (37.2 C)  TempSrc:  Oral    SpO2:  96% 97% 98%  Weight:  59.2 kg    Height:       No intake or output data in the 24 hours ending 11/16/19 1713 Filed Weights   11/14/19 0326 11/15/19 1318 11/15/19 1659  Weight: 59.9 kg 60 kg 59.2 kg  Examination:  General exam: Appears calm and comfortable Respiratory system: Clear to auscultation. Respiratory effort normal. Cardiovascular system: S1 & S2 heard, RRR. No murmurs, rubs, gallops or clicks. Gastrointestinal  system: Abdomen is nondistended, soft and nontender. Normal bowel sounds heard. Central nervous system: Alert and oriented. No focal neurological deficits. Extremities: No edema. No calf tenderness Skin: No cyanosis. No rashes Psychiatry: Judgement and insight appear normal. Mood & affect appropriate.      Data Reviewed: I have personally reviewed following labs and imaging studies  CBC: Recent Labs  Lab 11/10/19 1405 11/10/19 1759 11/13/19 1134 11/15/19 1036  WBC 10.0 12.2* 12.6* 12.6*  HGB 9.4* 10.8* 9.1* 8.5*  HCT 30.1* 34.0* 28.4* 26.3*  MCV 94.4 91.9 91.0 88.9  PLT 215 209 199 [id number]   Basic Metabolic Panel: Recent Labs  Lab 11/10/19 1405 11/10/19 1759 11/13/19 1248 11/15/19 1036  NA 136 134* 131* 135  K 3.9 3.3* 4.4 3.8  CL 95* 93* 94* 96*  CO2 26 27 22 22   GLUCOSE 168* 173* 198* 138*  BUN 33* 9 57* 35*  CREATININE 7.14* 3.17* 8.41* 6.38*  CALCIUM 8.7* 8.5* 8.3* 8.6*  PHOS 3.5 2.2* 5.2*  --    GFR: Estimated Creatinine Clearance: 7.8 mL/min (A) (by C-G formula based on SCr of 6.38 mg/dL (H)). Liver Function Tests: Recent Labs  Lab 11/10/19 1405 11/10/19 1759 11/13/19 1248 11/15/19 1036  AST  --   --   --  20  ALT  --   --   --  57*  ALKPHOS  --   --   --  111  BILITOT  --   --   --  0.4  PROT  --   --   --  6.6  ALBUMIN 2.2* 2.4* 1.7* 2.0*   No results for input(s): LIPASE, AMYLASE in the last 168 hours. Recent Labs  Lab 11/14/19 0522 11/15/19 1036  AMMONIA 25 33   Coagulation Profile: Recent Labs  Lab 11/12/19 0822 11/13/19 1134 11/14/19 0522 11/15/19 0718 11/16/19 0558  INR 4.8* 5.3* 5.2* 5.6* 5.2*   Cardiac Enzymes: No results for input(s): CKTOTAL, CKMB, CKMBINDEX, TROPONINI in the last 168 hours. BNP (last 3 results) No results for input(s): PROBNP in the last 8760 hours. HbA1C: No results for input(s): HGBA1C in the last 72 hours. CBG: Recent Labs  Lab 11/15/19 1140 11/15/19 1740 11/15/19 2113 11/16/19 0607 11/16/19 1211    GLUCAP 120* 108* 129* 91 96   Lipid Profile: Recent Labs    11/15/19 0718  CHOL 120  HDL 28*  LDLCALC 57  TRIG 177*  CHOLHDL 4.3   Thyroid Function Tests: No results for input(s): TSH, T4TOTAL, FREET4, T3FREE, THYROIDAB in the last 72 hours. Anemia Panel: No results for input(s): VITAMINB12, FOLATE, FERRITIN, TIBC, IRON, RETICCTPCT in the last 72 hours. Sepsis Labs: No results for input(s): PROCALCITON, LATICACIDVEN in the last 168 hours.  No results found for this or any previous visit (from the past 240 hour(s)).       Radiology Studies: DG Pelvis 1-2 Views  Result Date: 11/15/2019 CLINICAL DATA:  Abdominal and pelvic pain for 1 day EXAM: PELVIS - 1-2 VIEW COMPARISON:  None. FINDINGS: There is marked colonic distention better detailed on dedicated abdominal radiograph. Few air distended loops of colon are noted in the pelvis. No acute osseous or soft tissue abnormality is seen. Extensive degenerative changes noted in the SI joints and both hips. The osseous structures appear diffusely demineralized which may limit detection of  small or nondisplaced fractures. Atherosclerotic calcification of the inflow and proximal outflow arteries and numerous phleboliths are present in the pelvis. IMPRESSION: 1. Marked colonic distention, better detailed on dedicated abdominal radiograph. 2. The osseous structures appear diffusely demineralized which may limit detection of small or nondisplaced fractures. 3. No acute osseous or other soft tissue abnormality. Electronically Signed   By: Lovena Le M.D.   On: 11/15/2019 20:42   DG Abd 1 View  Result Date: 11/15/2019 CLINICAL DATA:  Generalized abdomen and pelvic pain for 1 day EXAM: ABDOMEN - 1 VIEW COMPARISON:  Radiograph 11/14/2019 FINDINGS: Increasing air distention of the colon. Some crescentic lucency is noted along the margins of a 1 of the distended colonic loops worrisome for developing pneumatosis. Additional segments of colon are air  and stool filled. Small bowel gas pattern is largely obscured at this time. Multilevel degenerative changes noted in the spine hips and pelvis. Phleboliths in the pelvis. Atherosclerotic calcification of the iliac branches. IMPRESSION: Increasing air distention of the colon parents with an appearance worrisome for a volvulus now with crescentic mural lucency suspicious for developing pneumatosis/possible pressure necrosis. A contrast-enhanced CT would provide optimal evaluation. Unenhanced CT could provide useful clinical information if unable to administer contrast media. These results will be called to the ordering clinician or representative by the Radiologist Assistant, and communication documented in the PACS or zVision Dashboard. Electronically Signed   By: Lovena Le M.D.   On: 11/15/2019 20:40   MR LUMBAR SPINE WO CONTRAST  Result Date: 11/16/2019 CLINICAL DATA:  Abnormal CT showing possible infection EXAM: MRI LUMBAR SPINE WITHOUT CONTRAST TECHNIQUE: Multiplanar, multisequence MR imaging of the lumbar spine was performed. No intravenous contrast was administered. COMPARISON:  08/23/2019 MRI, CT abdomen 11/16/2019 FINDINGS: Segmentation:  Standard. Alignment:  Stable Vertebrae: Diffusely abnormal marrow signal is again identified with marked heterogeneity. There is progression of abnormal signal particularly at the L3 and L4 levels, noting progression of erosive changes on same day CT. Conus medullaris and cauda equina: Conus extends to the L1-L2 level. Conus and cauda equina appear normal. Paraspinal and other soft tissues: Edema within the left greater than right psoas muscles extending inferiorly from the L3 level. There is a 1 cm area of T2 hyperintensity extending laterally from the L3-L4 disc level into the psoas muscle. Disc levels: Congenital narrowing of the spinal canal. Stable appearance of L1-L2, L2-L3, L4-L5, and L5-S1. L3-L4: Disc bulge, endplate retropulsion, and facet arthropathy.  Similar moderate canal stenosis with narrowing of lateral recesses. Increased effacement of neural foramina. IMPRESSION: Overall abnormal marrow signal is again likely due to renal osteodystrophy. However, progression of abnormal signal and endplate destruction at L3-L4 is suspicious for superimposed infection given new paraspinal edema and possible small collection extending laterally from the L3-L4 disc level into the left psoas muscle. Electronically Signed   By: Macy Mis M.D.   On: 11/16/2019 12:42   CT ABDOMEN PELVIS W CONTRAST  Addendum Date: 11/16/2019   ADDENDUM REPORT: 11/16/2019 10:00 ADDENDUM: Critical Value/emergent results were called by telephone at the time of interpretation on 11/16/2019 at 10:00 am to [place] , who verbally acknowledged these results. Electronically Signed   By: Marin Olp M.D.   On: 11/16/2019 10:00   Result Date: 11/16/2019 CLINICAL DATA:  Abdominal distension. Suspect bowel obstruction. Possible pneumatosis on KUB. EXAM: CT ABDOMEN AND PELVIS WITH CONTRAST TECHNIQUE: Multidetector CT imaging of the abdomen and pelvis was performed using the standard protocol following bolus administration of intravenous contrast.  CONTRAST:  26mL OMNIPAQUE IOHEXOL 300 MG/ML  SOLN COMPARISON:  09/10/2019 FINDINGS: Lower chest: Linear atelectasis over the posterior right lower lobe. Subtle posterior bibasilar atelectasis. Focal 9 mm stippled calcification over the posterior left lower lobe abutting the pleura. Evidence of patient's right IJ dialysis catheter with tip over the inferior cavoatrial junction. Mild calcification over the mitral valve annulus. Calcified plaque over the right coronary artery and minimally over the descending thoracic aorta. Hepatobiliary: Liver and gallbladder are normal. Possible tiny fleck of air over the porta hepatis as the biliary tree is otherwise unremarkable. Pancreas: Normal. Spleen: Normal. Adrenals/Urinary Tract: Adrenal glands are  normal. Kidneys demonstrate no evidence of hydronephrosis or nephrolithiasis. Several left renal cysts unchanged. Bladder and ureters are unremarkable. Stomach/Bowel: Suggestion of mild wall thickening of the distal esophagus and stomach. Small bowel is normal caliber and otherwise unremarkable. Cecum is located over the midline lower abdomen. Appendix is within normal. There is wall thickening over the ascending colon with findings suggesting pneumatosis over this segment of ascending colon. Ascending colon measures 7.5 cm in diameter. Cecum and terminal ileum are normal. No evidence of free peritoneal air. Vascular/Lymphatic: Moderate calcified plaque over the abdominal aorta which is otherwise normal in caliber. Calcified plaque over the superior mesenteric artery which is otherwise patent. No adenopathy. Reproductive: Previous hysterectomy. Other: Somewhat thickened bladder wall as the bladder is under distended. Small amount of free fluid in the pelvis. Musculoskeletal: Degenerative changes of the spine. There is a progressive destructive lytic process centered over the L3-4 disc space as findings are worse compared to the prior exam from 09/10/2019. Findings suggest an active process at this site which may be due to infection. This was previously evaluated by MRI 07/19/2019 and 08/23/2019 and thought to be stable at that point. Stable patchy lucency over L5 and the sacrum. Mild degenerative change of the hips. IMPRESSION: 1. Mildly distended ascending colon with wall thickening and associated evidence of pneumatosis coli. No free peritoneal air. No evidence of small-bowel obstruction. No evidence of mesenteric thrombosis/occlusion. Findings may be due to mesenteric ischemia on or related to infectious colitis. 2. Progressive destruction of the L3 and L4 vertebral bodies centered over the disc space. This was thought to be stable on previous lumbar spine MRI August and October 2020, although has shown interval  progression as infection is possible. 3. Mild wall thickening of the distal esophagus and stomach which may be infectious or inflammatory nature. 4.  Several left renal cysts unchanged. 5.  Aortic Atherosclerosis (ICD10-I70.0). Electronically Signed: By: Marin Olp M.D. On: 11/16/2019 09:20        Scheduled Meds: . acetaminophen  500 mg Oral TID  . calcium carbonate  1 tablet Oral 2 times per day  . carvedilol  12.5 mg Oral BID  . Chlorhexidine Gluconate Cloth  6 each Topical Q0600  . [START ON 11/20/2019] darbepoetin (ARANESP) injection - DIALYSIS  100 mcg Intravenous Q Mon-HD  . doxercalciferol  1.5 mcg Intravenous Q M,W,F-HD  . feeding supplement (ENSURE ENLIVE)  237 mL Oral BID BM  . insulin aspart  0-9 Units Subcutaneous TID WC  . isosorbide mononitrate  30 mg Oral QPC lunch  . lactulose  20 g Oral TID  . Melatonin  3 mg Oral QHS  . multivitamin  1 tablet Oral QPC lunch  . pantoprazole  40 mg Oral Daily  . Warfarin - Pharmacist Dosing Inpatient   Does not apply q1800   Continuous Infusions: . sodium chloride    .  sodium chloride    . ceFEPime (MAXIPIME) IV 1 g (11/15/19 2129)  . [START ON 11/20/2019] ferric gluconate (FERRLECIT/NULECIT) IV       LOS: 15 days     Cordelia Poche, MD [facility] 11/16/2019, 5:13 PM  If 7PM-7AM, please contact night-coverage [email or web address]

## 2019-11-16 NOTE — Consult Note (Signed)
Reason for Consult: L3-4 discitis/osteomyelitis Referring Physician: Cordelia Poche, MD ([facility])  [name] is an 68 y.o. black female.  HPI: Patient with numerous medical comorbidities including end-stage renal disease on hemodialysis, type 2 diabetes, stroke, hypertension, hyperlipidemia, chronic anemia, Pseudomonas bacteremia, L3-4 discitis/osteomyelitis, mitral valve endocarditis, hepatic encephalopathy, chronic anemia, and who has had 3 hospitalizations over the past 4 months including 1) 8/27-9/1 for Pseudomonas bacteremia and L3-4 discitis (she was treated with a 6-week course of Fortaz), 2) [id number] for metabolic encephalopathy, and 3) 11/27-12/9 for acute left frontal lobe and right basal ganglia CVAs, Pseudomonas bacteremia, and mitral valve endocarditis and vegetation.  Patient was admitted to [facility] inpatient rehab for CIR on 12/9.  Notably the patient is anticoagulated with Coumadin.  Speaking with Dr. Lonny Prude who requested the neurosurgical consultation, he is unsure why the patient is anticoagulated with Coumadin, but is presuming it is related to her cerebrovascular disease and most recent presenting strokes.  Patient's current CODE STATUS is DNR.  Patient has been having gastrointestinal difficulties, which led to CT of the abdomen and pelvis being done earlier today.  After that showed evidence of progressive destruction of the L3 and L4 vertebra, centered over the disc space, Dr. Lonny Prude then proceeded with MRI of the lumbar spine without contrast.  The MRI confirmed progression of abnormal signal and endplate destruction at L3-4, again suspicious for infection, with some small extension laterally into the left psoas muscle; it did not show evidence of extension into the spinal canal, and there is no epidural abscess.  Patient continues to be followed by the infectious disease service, most recently by Dr. Rhina Brackett Dam 2 days ago (initially seen for the current  hospitalization by Dr. Karolee Ohs on 11/30).  Dr. Lonny Prude requested neurosurgical consultation for recommendations from a neurosurgical perspective.  I did have an opportunity to speak with the patient's son, [name] Clark.  He explained that she has been having low back discomfort for 6 months or so.  The patient notes that at this time she has some mild discomfort in the low back, but in fact has greater abdominal complaints at this point.  Past Medical History:  Past Medical History:  Diagnosis Date  . Arthritis    hands  . Constipation   . Diabetes mellitus without complication (HCC)    Type II  . ESRD (end stage renal disease) ([place])     M/W/F- Hemodialysis  . GERD (gastroesophageal reflux disease)   . Headache   . Hyperlipidemia   . Hypertension   . Irregular heart rate   . Stroke [facility])    TIA - approx 2010- no residual    Past Surgical History:  Past Surgical History:  Procedure Laterality Date  . ABDOMINAL HYSTERECTOMY    . AORTIC ARCH ANGIOGRAPHY N/A 03/28/2019   Procedure: AORTIC ARCH ANGIOGRAPHY;  Surgeon: Waynetta Sandy, MD;  Location: [place] [facility];  Service: Cardiovascular;  Laterality: N/A;  . AV FISTULA PLACEMENT Left 06/13/2018   Procedure: ARTERIOVENOUS (AV) FISTULA CREATION;  Surgeon: Rosetta Posner, MD;  Location: [place];  Service: Vascular;  Laterality: Left;  . AV FISTULA PLACEMENT Left 08/18/2018   Procedure: INSERTION OF 4-7MM X 45CM ARTERIOVENOUS (AV) GORE-TEX GRAFT LEFT  FOREARM;  Surgeon: Rosetta Posner, MD;  Location: [place];  Service: Vascular;  Laterality: Left;  . AV FISTULA PLACEMENT Right 04/06/2019   Procedure: INSERTION OF ARTERIOVENOUS (AV) GORE-TEX GRAFT RIGHT UPPER ARM;  Surgeon: Waynetta Sandy, MD;  [facility]: [facility];  Service: Vascular;  Laterality: Right;  . AV FISTULA PLACEMENT Right 04/13/2019   Procedure: INSERTION OF ARTERIOVENOUS (AV) BOVINE  ARTEGRAFT GRAFT RIGHT UPPER EXTREMITY;  Surgeon: Serafina Mitchell, MD;   Location: [place];  Service: Vascular;  Laterality: Right;  . [place] REMOVAL Right 05/16/2019   Procedure: REMOVAL OF ARTERIOVENOUS GORETEX GRAFT ([place]) RIGHT ARM;  Surgeon: Angelia Mould, MD;  Location: [place];  Service: Vascular;  Laterality: Right;  . BASCILIC VEIN TRANSPOSITION Right 03/07/2019   Procedure: First Stage Bascilic Vein Transposition Right Arm;  Surgeon: Serafina Mitchell, MD;  Location: [place];  Service: Vascular;  Laterality: Right;  . CATARACT EXTRACTION Right 2005  . INSERTION OF DIALYSIS CATHETER N/A 08/18/2018   Procedure: INSERTION OF DIALYSIS CATHETER;  Surgeon: Rosetta Posner, MD;  Location: [place];  Service: Vascular;  Laterality: N/A;  . INSERTION OF DIALYSIS CATHETER Right 07/25/2019   Procedure: INSERTION OF DIALYSIS CATHETER RIGHT INTERNAL JUGULAR (TUNNELED);  Surgeon: Virl Cagey, MD;  Location: [facility];  Service: General;  Laterality: Right;  . IR FLUORO GUIDE CV LINE LEFT  10/26/2019  . IR FLUORO GUIDE CV LINE RIGHT  12/06/2018  . IR PTA ADDL CENTRAL DIALYSIS SEG THRU DIALY CIRCUIT RIGHT Right 12/06/2018  . IR REMOVAL TUN CV CATH W/O FL  10/23/2019  . IR US GUIDE VASC ACCESS LEFT  10/26/2019  . TEE WITHOUT CARDIOVERSION N/A 10/26/2019   Procedure: TRANSESOPHAGEAL ECHOCARDIOGRAM (TEE);  Surgeon: Lelon Perla, MD;  Location: [place];  Service: Cardiovascular;  Laterality: N/A;  . THROMBECTOMY AND REVISION OF ARTERIOVENTOUS (AV) GORETEX  GRAFT Left 09/29/2018   Procedure: INSERTION OF ARTERIOVENTOUS (AV) GORETEX  GRAFT ARM;  Surgeon: Waynetta Sandy, MD;  Location: [place];  Service: Vascular;  Laterality: Left;  . UPPER EXTREMITY ANGIOGRAPHY Right 03/28/2019   Procedure: UPPER EXTREMITY ANGIOGRAPHY;  Surgeon: Waynetta Sandy, MD;  Location: [place] [facility];  Service: Cardiovascular;  Laterality: Right;  . VENOGRAM  10/27/2018   Procedure: Venogram;  Surgeon: Marty Heck, MD;  Location: [place] [facility];  Service: Cardiovascular;;   bilateral arm    Family History:  Family History  Problem Relation Age of Onset  . Heart murmur Mother   . Heart failure Mother   . Diabetes Mother   . Cirrhosis Father   . Alcoholism Father     Social History:  reports that she has never smoked. She has never used smokeless tobacco. She reports that she does not drink alcohol or use drugs.  Allergies: No Known Allergies  Medications: I have reviewed the patient's current medications.  ROS: Notable for complaints of abdominal discomfort and gas, as well as mild back discomfort.  Physical Examination: Well-developed well nourished black female in no acute distress Blood pressure 113/71, pulse 89, temperature 99 F (37.2 C), resp. rate 18, height 5\' 7"  (1.702 m), weight 59.2 kg, SpO2 98 %.  Extremity: No clubbing, cyanosis, or edema Musculoskeletal: Minimal discomfort to palpation over the mid lumbar region.  Negative straight leg raising bilaterally.  Neurological Examination: Mental Status Examination: Awake and alert, following commands, speech fluent.  Motor Examination: Iliopsoas strength at least 4/5 bilaterally, but some discomfort on testing.  Strength otherwise 5/5 through quadriceps, dorsiflexor, EHL, and plantar flexor bilaterally. Sensory Examination: Intact to light touch and pinprick in the distal lower extremities. Reflex Examination:   Absent the quadriceps and gastrocnemius. Gait and Stance Examination: Not tested due to the nature of the  patient's current condition.   Results for orders placed or performed during the hospital encounter of 11/01/19 (from the past 48 hour(s))  Glucose, capillary     Status: Abnormal   Collection Time: 11/14/19  8:47 PM  Result Value Ref Range   Glucose-Capillary 258 (H) 70 - 99 mg/dL  Glucose, capillary     Status: Abnormal   Collection Time: 11/15/19  6:19 AM  Result Value Ref Range   Glucose-Capillary 147 (H) 70 - 99 mg/dL  Protime-INR     Status: Abnormal   Collection  Time: 11/15/19  7:18 AM  Result Value Ref Range   Prothrombin Time 50.5 (H) 11.4 - 15.2 seconds   INR 5.6 (HH) 0.8 - 1.2    Comment: REPEATED TO VERIFY CRITICAL RESULT CALLED TO, READ BACK BY AND VERIFIED WITH: Gardiner Fanti AT [id number] 11/15/2019 BY ZBEECH. (NOTE) INR goal varies based on device and disease states. Performed at [facility], [place] [address] [place], [place] [address]   Lipid panel     Status: Abnormal   Collection Time: 11/15/19  7:18 AM  Result Value Ref Range   Cholesterol 120 0 - 200 mg/dL   Triglycerides 177 (H) <150 mg/dL   HDL 28 (L) >40 mg/dL   Total CHOL/HDL Ratio 4.3 RATIO   VLDL 35 0 - 40 mg/dL   LDL Cholesterol 57 0 - 99 mg/dL    Comment:        Total Cholesterol/HDL:CHD Risk Coronary Heart Disease Risk Table                     Men   Women  1/2 Average Risk   3.4   3.3  Average Risk       5.0   4.4  2 X Average Risk   9.6   7.1  3 X Average Risk  23.4   11.0        Use the calculated Patient Ratio above and the CHD Risk Table to determine the patient's CHD Risk.        ATP III CLASSIFICATION (LDL):  <100     mg/dL   Optimal  100-129  mg/dL   Near or Above                    Optimal  130-159  mg/dL   Borderline  160-189  mg/dL   High  >190     mg/dL   Very High Performed at [place] [address] [place], [place] [address]   Comprehensive metabolic panel     Status: Abnormal   Collection Time: 11/15/19 10:36 AM  Result Value Ref Range   Sodium 135 135 - 145 mmol/L   Potassium 3.8 3.5 - 5.1 mmol/L   Chloride 96 (L) 98 - 111 mmol/L   CO2 22 22 - 32 mmol/L   Glucose, Bld 138 (H) 70 - 99 mg/dL   BUN 35 (H) 8 - 23 mg/dL   Creatinine, Ser 6.38 (H) 0.44 - 1.00 mg/dL   Calcium 8.6 (L) 8.9 - 10.3 mg/dL   Total Protein 6.6 6.5 - 8.1 g/dL   Albumin 2.0 (L) 3.5 - 5.0 g/dL   AST 20 15 - 41 U/L   ALT 57 (H) 0 - 44 U/L   Alkaline Phosphatase 111 38 - 126 U/L   Total Bilirubin 0.4 0.3 - 1.2 mg/dL   GFR calc non Af Amer 6 (L)  >60 mL/min  GFR calc Af Amer 7 (L) >60 mL/min   Anion gap 17 (H) 5 - 15    Comment: Performed at [facility] [address] [place], [place] [address]  CBC     Status: Abnormal   Collection Time: 11/15/19 10:36 AM  Result Value Ref Range   WBC 12.6 (H) 4.0 - 10.5 K/uL   RBC 2.96 (L) 3.87 - 5.11 MIL/uL   Hemoglobin 8.5 (L) 12.0 - 15.0 g/dL   HCT 26.3 (L) 36.0 - 46.0 %   MCV 88.9 80.0 - 100.0 fL   MCH 28.7 26.0 - 34.0 pg   MCHC 32.3 30.0 - 36.0 g/dL   RDW 17.2 (H) 11.5 - 15.5 %   Platelets 241 150 - 400 K/uL   nRBC 0.0 0.0 - 0.2 %    Comment: Performed at [facility], [place] [address], [place], [place] [address]  Ammonia     Status: None   Collection Time: 11/15/19 10:36 AM  Result Value Ref Range   Ammonia 33 9 - 35 umol/L    Comment: Performed at [facility], [place] [address], [place], [place] [address]  Glucose, capillary     Status: Abnormal   Collection Time: 11/15/19 11:40 AM  Result Value Ref Range   Glucose-Capillary 120 (H) 70 - 99 mg/dL  Glucose, capillary     Status: Abnormal   Collection Time: 11/15/19  5:40 PM  Result Value Ref Range   Glucose-Capillary 108 (H) 70 - 99 mg/dL  Glucose, capillary     Status: Abnormal   Collection Time: 11/15/19  9:13 PM  Result Value Ref Range   Glucose-Capillary 129 (H) 70 - 99 mg/dL  Protime-INR     Status: Abnormal   Collection Time: 11/16/19  5:58 AM  Result Value Ref Range   Prothrombin Time 47.6 (H) 11.4 - 15.2 seconds    Comment: REPEATED TO VERIFY   INR 5.2 (HH) 0.8 - 1.2    Comment: REPEATED TO VERIFY CRITICAL RESULT CALLED TO, READ BACK BY AND VERIFIED WITH: A LEONAR,RN 11/16/2019 [id number] (NOTE) INR goal varies based on device and disease states. Performed at [facility], [place] [address] [place], [place] [address]   Glucose, capillary     Status: None   Collection Time: 11/16/19  6:07 AM  Result Value Ref Range   Glucose-Capillary 91 70 - 99 mg/dL  Glucose, capillary     Status:  None   Collection Time: 11/16/19 12:11 PM  Result Value Ref Range   Glucose-Capillary 96 70 - 99 mg/dL    DG Pelvis 1-2 Views  Result Date: 11/15/2019 CLINICAL DATA:  Abdominal and pelvic pain for 1 day EXAM: PELVIS - 1-2 VIEW COMPARISON:  None. FINDINGS: There is marked colonic distention better detailed on dedicated abdominal radiograph. Few air distended loops of colon are noted in the pelvis. No acute osseous or soft tissue abnormality is seen. Extensive degenerative changes noted in the SI joints and both hips. The osseous structures appear diffusely demineralized which may limit detection of small or nondisplaced fractures. Atherosclerotic calcification of the inflow and proximal outflow arteries and numerous phleboliths are present in the pelvis. IMPRESSION: 1. Marked colonic distention, better detailed on dedicated abdominal radiograph. 2. The osseous structures appear diffusely demineralized which may limit detection of small or nondisplaced fractures. 3. No acute osseous or other soft tissue abnormality. Electronically Signed   By: Lovena Le M.D.   On: 11/15/2019 20:42  DG Abd 1 View  Result Date: 11/15/2019 CLINICAL DATA:  Generalized abdomen and pelvic pain for 1 day EXAM: ABDOMEN - 1 VIEW COMPARISON:  Radiograph 11/14/2019 FINDINGS: Increasing air distention of the colon. Some crescentic lucency is noted along the margins of a 1 of the distended colonic loops worrisome for developing pneumatosis. Additional segments of colon are air and stool filled. Small bowel gas pattern is largely obscured at this time. Multilevel degenerative changes noted in the spine hips and pelvis. Phleboliths in the pelvis. Atherosclerotic calcification of the iliac branches. IMPRESSION: Increasing air distention of the colon parents with an appearance worrisome for a volvulus now with crescentic mural lucency suspicious for developing pneumatosis/possible pressure necrosis. A contrast-enhanced CT would  provide optimal evaluation. Unenhanced CT could provide useful clinical information if unable to administer contrast media. These results will be called to the ordering clinician or representative by the Radiologist Assistant, and communication documented in the PACS or zVision Dashboard. Electronically Signed   By: Lovena Le M.D.   On: 11/15/2019 20:40   MR LUMBAR SPINE WO CONTRAST  Result Date: 11/16/2019 CLINICAL DATA:  Abnormal CT showing possible infection EXAM: MRI LUMBAR SPINE WITHOUT CONTRAST TECHNIQUE: Multiplanar, multisequence MR imaging of the lumbar spine was performed. No intravenous contrast was administered. COMPARISON:  08/23/2019 MRI, CT abdomen 11/16/2019 FINDINGS: Segmentation:  Standard. Alignment:  Stable Vertebrae: Diffusely abnormal marrow signal is again identified with marked heterogeneity. There is progression of abnormal signal particularly at the L3 and L4 levels, noting progression of erosive changes on same day CT. Conus medullaris and cauda equina: Conus extends to the L1-L2 level. Conus and cauda equina appear normal. Paraspinal and other soft tissues: Edema within the left greater than right psoas muscles extending inferiorly from the L3 level. There is a 1 cm area of T2 hyperintensity extending laterally from the L3-L4 disc level into the psoas muscle. Disc levels: Congenital narrowing of the spinal canal. Stable appearance of L1-L2, L2-L3, L4-L5, and L5-S1. L3-L4: Disc bulge, endplate retropulsion, and facet arthropathy. Similar moderate canal stenosis with narrowing of lateral recesses. Increased effacement of neural foramina. IMPRESSION: Overall abnormal marrow signal is again likely due to renal osteodystrophy. However, progression of abnormal signal and endplate destruction at L3-L4 is suspicious for superimposed infection given new paraspinal edema and possible small collection extending laterally from the L3-L4 disc level into the left psoas muscle. Electronically  Signed   By: Macy Mis M.D.   On: 11/16/2019 12:42   CT ABDOMEN PELVIS W CONTRAST  Addendum Date: 11/16/2019   ADDENDUM REPORT: 11/16/2019 10:00 ADDENDUM: Critical Value/emergent results were called by telephone at the time of interpretation on 11/16/2019 at 10:00 am to [place] , who verbally acknowledged these results. Electronically Signed   By: Marin Olp M.D.   On: 11/16/2019 10:00   Result Date: 11/16/2019 CLINICAL DATA:  Abdominal distension. Suspect bowel obstruction. Possible pneumatosis on KUB. EXAM: CT ABDOMEN AND PELVIS WITH CONTRAST TECHNIQUE: Multidetector CT imaging of the abdomen and pelvis was performed using the standard protocol following bolus administration of intravenous contrast. CONTRAST:  72mL OMNIPAQUE IOHEXOL 300 MG/ML  SOLN COMPARISON:  09/10/2019 FINDINGS: Lower chest: Linear atelectasis over the posterior right lower lobe. Subtle posterior bibasilar atelectasis. Focal 9 mm stippled calcification over the posterior left lower lobe abutting the pleura. Evidence of patient's right IJ dialysis catheter with tip over the inferior cavoatrial junction. Mild calcification over the mitral valve annulus. Calcified plaque over the right coronary artery and minimally over the  descending thoracic aorta. Hepatobiliary: Liver and gallbladder are normal. Possible tiny fleck of air over the porta hepatis as the biliary tree is otherwise unremarkable. Pancreas: Normal. Spleen: Normal. Adrenals/Urinary Tract: Adrenal glands are normal. Kidneys demonstrate no evidence of hydronephrosis or nephrolithiasis. Several left renal cysts unchanged. Bladder and ureters are unremarkable. Stomach/Bowel: Suggestion of mild wall thickening of the distal esophagus and stomach. Small bowel is normal caliber and otherwise unremarkable. Cecum is located over the midline lower abdomen. Appendix is within normal. There is wall thickening over the ascending colon with findings suggesting  pneumatosis over this segment of ascending colon. Ascending colon measures 7.5 cm in diameter. Cecum and terminal ileum are normal. No evidence of free peritoneal air. Vascular/Lymphatic: Moderate calcified plaque over the abdominal aorta which is otherwise normal in caliber. Calcified plaque over the superior mesenteric artery which is otherwise patent. No adenopathy. Reproductive: Previous hysterectomy. Other: Somewhat thickened bladder wall as the bladder is under distended. Small amount of free fluid in the pelvis. Musculoskeletal: Degenerative changes of the spine. There is a progressive destructive lytic process centered over the L3-4 disc space as findings are worse compared to the prior exam from 09/10/2019. Findings suggest an active process at this site which may be due to infection. This was previously evaluated by MRI 07/19/2019 and 08/23/2019 and thought to be stable at that point. Stable patchy lucency over L5 and the sacrum. Mild degenerative change of the hips. IMPRESSION: 1. Mildly distended ascending colon with wall thickening and associated evidence of pneumatosis coli. No free peritoneal air. No evidence of small-bowel obstruction. No evidence of mesenteric thrombosis/occlusion. Findings may be due to mesenteric ischemia on or related to infectious colitis. 2. Progressive destruction of the L3 and L4 vertebral bodies centered over the disc space. This was thought to be stable on previous lumbar spine MRI August and October 2020, although has shown interval progression as infection is possible. 3. Mild wall thickening of the distal esophagus and stomach which may be infectious or inflammatory nature. 4.  Several left renal cysts unchanged. 5.  Aortic Atherosclerosis (ICD10-I70.0). Electronically Signed: By: Marin Olp M.D. On: 11/16/2019 09:20     Assessment/Plan: Patient with numerous medical comorbidities and multiple hospitalizations over the past 4 months as described above.  Patient  had L3-4 discitis/osteomyelitis diagnosed at the time of her admission on earliest of these most recent hospitalizations (8/27), along with Pseudomonas bacteremia.  Infectious disease was consulted, and patient was treated with 6 weeks of Fortaz.  She has had multiple imaging studies including MRI lumbar spine without contrast 8/26, 9/30, and 12/24, and CT scan of abdomen and pelvis 10/18 and 12/24.  Reviewing the studies there has been steady progression of the L3-4 discitis/osteomyelitis, with the studies today showing further destruction of the L3 and L4 vertebral bodies.  Infectious disease was again consulted at the time of her most recent hospitalization (11/27) and currently they are treating her with cefepime.  Her exam shows good neurologic function in the lower extremities, and her MRI today despite showing progression of her L3-4 discitis/osteomyelitis does not show any significant encroachment on the spinal canal and no neural impingement.  I have discussed my assessment and recommendations with Dr. Lonny Prude by phone, and have spoken as well with the patient and her son [name].  Their questions regarding my assessment and recommendations were answered for them.    There is no indication for neurosurgical intervention at this time, but certainly this progressive infectious process is going to need  long-term antibiotic therapy (potentially lifetime antibiotic suppression, in consideration of the progression of the infection despite the previous 6-week course of antibiotics (August/September/October) and continued antibiotics since most recently presenting 11/27, currently on cefepime, under the direction of the infectious disease consultants.  I have recommended to Dr. Lonny Prude that he update infectious disease regarding the CT and MRI results.  I do not know whether they are going to want additional cultures, that would have to be obtained by interventional radiology (I do not know how feasible that would  be while she is anticoagulated with Coumadin).  I have explained to Dr. Lonny Prude that I will be out of town until January 4.  If further neurosurgical consultation is needed, prior to January 4, please contact the on-call neurosurgeon for [place].  Following January 4, I can be contacted, if needed.  Hosie Spangle, MD 11/16/2019, 5:53 PM

## 2019-11-16 NOTE — Progress Notes (Signed)
Speech Language Pathology Weekly Progress and Session Note  Patient Details  Name: [name] MRN: [id number] Date of Birth: October 06, 1951  Beginning of progress report period: November 09, 2019 End of progress report period: November 16, 2019  Today's Date: 11/16/2019 SLP Individual Time: [id number] SLP Individual Time Calculation (min): 15 min  Short Term Goals: Week 2: SLP Short Term Goal 1 (Week 2): Pt will demonstrate functional problem solving during basic tasks with Min A verbal/visual cues SLP Short Term Goal 1 - Progress (Week 2): Progressing toward goal SLP Short Term Goal 2 (Week 2): Pt will demonstrate recall of day to day/new information with Min A verbal cues for use of compensatory memory strategies SLP Short Term Goal 2 - Progress (Week 2): Progressing toward goal SLP Short Term Goal 3 (Week 2): Pt will selectively attend to functional tasks with Supervision A verbal cues for redirection SLP Short Term Goal 3 - Progress (Week 2): Not progressing SLP Short Term Goal 4 (Week 2): Pt will demonstrate anticipatory awareness by stating 2 ADLs she will need assistance with upon her return home with Min A verbal/visual cues SLP Short Term Goal 4 - Progress (Week 2): Progressing toward goal    New Short Term Goals: Week 3: SLP Short Term Goal 1 (Week 3): Pt will demonstrate functional problem solving during basic tasks with Mod A verbal/visual cues SLP Short Term Goal 2 (Week 3): Pt will demonstrate recall of day to day/new information with Mod A verbal cues for use of compensatory memory strategies SLP Short Term Goal 3 (Week 3): Pt will sustain attention to functional tasks with Min A verbal cues for redirection SLP Short Term Goal 4 (Week 3): Pt will detect and correct errors during functional tasks with Mod A verbal/visual cues. SLP Short Term Goal 5 (Week 3): Pt will initiate during functional tasks with Mod A verbal/visual cues from clinician.  Weekly Progress Updates:  Due to medical instability which resulted in many missed minutes of therapy due to testing and/or fatigue and confusion that limited pt's full participation in therapy, pt met 0 out of 4 short term goals. Pt is currently Mod-Max assist for due to cognitive impairments impacting sustained (and at time focused) attention, basic problem solving, short term recall, and emergent awareness. Pt does not have dysphagia and is tolerating a regular diet with thin liquids, however SLP recently made recommendation for pt to have full supervision during meals due to cognitive status. Pt has demonstrated improved orientation to place and situation. Pt and family education is ongoing. Pt would continue to benefit from skilled ST while inpatient in order to maximize functional independence and reduce burden of care prior to discharge. Anticipate that pt will need 24/7 supervision at discharge in addition to [place] follow up at next level of care.     Intensity: Minumum of 1-2 x/day, 30 to 90 minutes Frequency: Total of 15 hours over 7 days of combined therapies Duration/Length of Stay: TBD Treatment/Interventions: Functional tasks;Cueing hierarchy;Environmental controls;Internal/external aids;Patient/family education;Cognitive remediation/compensation   Daily Session  Skilled Therapeutic Interventions: Pt was seen for skilled ST targeting cognitive goals. Upon arrival, pt was independently oriented to self and place, however exhibiting language of confusion regarding belief about family member out in the hallway. SLP provided clarification, no family or visitors were outside the room at this time. Pt required Max A verbal cues to open eyes and achieve focused attention for 10 second intervals throughout interactions. Total A verbal and visual cues  were required for  pt to recall information about therapy appointments with use of printed schedule. RN and rad tech arrived to take pt to CT scan, therefore pt missed 45 minutes  of skilled ST. Pt left in bed with alarm set and rad tech present to transfer pt. Continue per current plan of care.       Pain Pain Assessment Pain Scale: Faces Faces Pain Scale: Hurts a little bit Pain Type: Acute pain Pain Location: Back Pain Orientation: Lower Pain Descriptors / Indicators: Grimacing Pain Onset: Other (Comment)(with raising head of bed) Pain Intervention(s): Repositioned Multiple Pain Sites: No  Therapy/Group: Individual Therapy  Arbutus Leas 11/16/2019, 7:54 AM

## 2019-11-16 NOTE — Progress Notes (Addendum)
[facility] Progress Note   Dialysis Orders: [facility] MWF3.5 hours TDC - mult failed accesses Profile 1 - pressure drops very easily;max is pull 1.1 an hour; do not pull rinseback revaclear 300 dialyzer 400/600 2 K 2.5 Ca EDW 62.5  Epo 1200 U q HD; iron 50 mg/weekly 1.5 mcg hectoral each tx  Assessment/Plan:  1. Recurrent Pseudomonas bacteremia (Likely HD CRB) with evidence of mitral valve endocarditisthat is post [facility] exchange for infection focus control. Seen by CTA w/no  plan for surgical intervention-this will be reevaluated as an outpatient following completion of antibiotic therapy with cefepime; off cipro as of 12/15 d/t witnessed seizure.  2. ESRD: MWF -next HD Saturday due to holiday schedule - orders written  hold heparin due to high INR unless catheter flow issues 3. Anemia:hgb  9.1 > 8.5 continue ESA^ next dose to 100--tsat 16% 12/15 - Fe held due to being treated for infection - though I think we could resume at least weekly at this point.  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  5. 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 Ongoing inpatient rehabilitation for management of neurological deficits. Repeat MRI showedno acute changes. INR 5.2 increasing  6. Hypertension/volume : net UF 500 ml 12/23 post wt 59.2- keep SBP > 110 7.Nutrition -diet liberalized to regular - has supplements/multivits ordered - alb very low - not eating much - contributing to weakness 8. MS change since 12/13 - no obvious findings on work up. Per notes may have been cefepime related -MS seems to wax and wane . Neurontin 100 mg per day stopped 12/14 NH3 25 - per primary 9.Yeast UTI >80 K-s/p diflucan 10. Deconditioning -variable levels of functioning appear related to mental status  11. Sacral ulcer  Myriam Jacobson, PA-C [facility] Beeper [phone] 11/16/2019,11:08 AM  LOS: 15 days    Patient seen and examined, agree with above note with above modifications. No new issues.  Next HD will be Saturday due to holiday schedule. Patient will not be physically seen tomorrow by renal - will see again on Saturday.  Call [phone] with any issues on Friday  [name], MD 11/16/2019     Subjective:   HD yest- removed 1000- tolerated well.  Ate next to no breakfast.  Talking on the phone. Seems very clear today.  Waiting on her son, [name].  Objective Vitals:   11/15/19 1605 11/15/19 1659 11/15/19 2018 11/16/19 0605  BP: (!) 87/60 126/69 104/63 113/71  Pulse: 95 (!) 102 95 89  Resp:  18 16 18   Temp:  98.5 F (36.9 C) 98.8 F (37.1 C) 99 F (37.2 C)  TempSrc:  Oral    SpO2:  96% 97% 98%  Weight:  59.2 kg    Height:       Physical Exam General: breathing easily confused Heart: RRR Lungs: no rales Abdomen: soft generalized tenderness Extremities: no LE edema Dialysis Access:  Right IJ [facility]   Additional Objective Labs: Basic Metabolic Panel: Recent Labs  Lab 11/10/19 1405 11/10/19 1759 11/13/19 1248 11/15/19 1036  NA 136 134* 131* 135  K 3.9 3.3* 4.4 3.8  CL 95* 93* 94* 96*  CO2 26 27 22 22   GLUCOSE 168* 173* 198* 138*  BUN 33* 9 57* 35*  CREATININE 7.14* 3.17* 8.41* 6.38*  CALCIUM 8.7* 8.5* 8.3* 8.6*  PHOS 3.5 2.2* 5.2*  --    Liver  Function Tests: Recent Labs  Lab 11/10/19 1759 11/13/19 1248 11/15/19 1036  AST  --   --  20  ALT  --   --  57*  ALKPHOS  --   --  111  BILITOT  --   --  0.4  PROT  --   --  6.6  ALBUMIN 2.4* 1.7* 2.0*   No results for input(s): LIPASE, AMYLASE in the last 168 hours. CBC: Recent Labs  Lab 11/10/19 1405 11/10/19 1759 11/13/19 1134 11/15/19 1036  WBC 10.0 12.2* 12.6* 12.6*  HGB 9.4* 10.8* 9.1* 8.5*  HCT 30.1* 34.0* 28.4* 26.3*  MCV 94.4 91.9 91.0 88.9  PLT 215 209 199 241   Blood Culture    Component Value Date/Time   SDES  URINE, RANDOM 11/06/2019 1107   SPECREQUEST  11/06/2019 1107    NONE Performed at [facility], [place] [address] [place], [place] [address]    CULT 80,000 COLONIES/mL YEAST (A) 11/06/2019 1107   REPTSTATUS 11/07/2019 FINAL 11/06/2019 1107    Cardiac Enzymes: No results for input(s): CKTOTAL, CKMB, CKMBINDEX, TROPONINI in the last 168 hours. CBG: Recent Labs  Lab 11/15/19 0619 11/15/19 1140 11/15/19 1740 11/15/19 2113 11/16/19 0607  GLUCAP 147* 120* 108* 129* 91   Iron Studies: No results for input(s): IRON, TIBC, TRANSFERRIN, FERRITIN in the last 72 hours. Lab Results  Component Value Date   INR 5.2 (HH) 11/16/2019   INR 5.6 (HH) 11/15/2019   INR 5.2 (HH) 11/14/2019   Studies/Results: DG Pelvis 1-2 Views  Result Date: 11/15/2019 CLINICAL DATA:  Abdominal and pelvic pain for 1 day EXAM: PELVIS - 1-2 VIEW COMPARISON:  None. FINDINGS: There is marked colonic distention better detailed on dedicated abdominal radiograph. Few air distended loops of colon are noted in the pelvis. No acute osseous or soft tissue abnormality is seen. Extensive degenerative changes noted in the SI joints and both hips. The osseous structures appear diffusely demineralized which may limit detection of small or nondisplaced fractures. Atherosclerotic calcification of the inflow and proximal outflow arteries and numerous phleboliths are present in the pelvis. IMPRESSION: 1. Marked colonic distention, better detailed on dedicated abdominal radiograph. 2. The osseous structures appear diffusely demineralized which may limit detection of small or nondisplaced fractures. 3. No acute osseous or other soft tissue abnormality. Electronically Signed   By: Lovena Le M.D.   On: 11/15/2019 20:42   DG Abd 1 View  Result Date: 11/15/2019 CLINICAL DATA:  Generalized abdomen and pelvic pain for 1 day EXAM: ABDOMEN - 1 VIEW COMPARISON:  Radiograph 11/14/2019 FINDINGS: Increasing air distention of the colon. Some  crescentic lucency is noted along the margins of a 1 of the distended colonic loops worrisome for developing pneumatosis. Additional segments of colon are air and stool filled. Small bowel gas pattern is largely obscured at this time. Multilevel degenerative changes noted in the spine hips and pelvis. Phleboliths in the pelvis. Atherosclerotic calcification of the iliac branches. IMPRESSION: Increasing air distention of the colon parents with an appearance worrisome for a volvulus now with crescentic mural lucency suspicious for developing pneumatosis/possible pressure necrosis. A contrast-enhanced CT would provide optimal evaluation. Unenhanced CT could provide useful clinical information if unable to administer contrast media. These results will be called to the ordering clinician or representative by the Radiologist Assistant, and communication documented in the PACS or zVision Dashboard. Electronically Signed   By: Lovena Le M.D.   On: 11/15/2019 20:40   CT ABDOMEN PELVIS W CONTRAST  Addendum Date: 11/16/2019   ADDENDUM REPORT: 11/16/2019 10:00 ADDENDUM: Critical Value/emergent results were called by telephone at the time of interpretation on 11/16/2019 at 10:00 am to Anthon , who verbally acknowledged these results. Electronically Signed   By: Marin Olp M.D.   On: 11/16/2019 10:00   Result Date: 11/16/2019 CLINICAL DATA:  Abdominal distension. Suspect bowel obstruction. Possible pneumatosis on KUB. EXAM: CT ABDOMEN AND PELVIS WITH CONTRAST TECHNIQUE: Multidetector CT imaging of the abdomen and pelvis was performed using the standard protocol following bolus administration of intravenous contrast. CONTRAST:  91mL OMNIPAQUE IOHEXOL 300 MG/ML  SOLN COMPARISON:  09/10/2019 FINDINGS: Lower chest: Linear atelectasis over the posterior right lower lobe. Subtle posterior bibasilar atelectasis. Focal 9 mm stippled calcification over the posterior left lower lobe abutting the pleura. Evidence  of patient's right IJ dialysis catheter with tip over the inferior cavoatrial junction. Mild calcification over the mitral valve annulus. Calcified plaque over the right coronary artery and minimally over the descending thoracic aorta. Hepatobiliary: Liver and gallbladder are normal. Possible tiny fleck of air over the porta hepatis as the biliary tree is otherwise unremarkable. Pancreas: Normal. Spleen: Normal. Adrenals/Urinary Tract: Adrenal glands are normal. Kidneys demonstrate no evidence of hydronephrosis or nephrolithiasis. Several left renal cysts unchanged. Bladder and ureters are unremarkable. Stomach/Bowel: Suggestion of mild wall thickening of the distal esophagus and stomach. Small bowel is normal caliber and otherwise unremarkable. Cecum is located over the midline lower abdomen. Appendix is within normal. There is wall thickening over the ascending colon with findings suggesting pneumatosis over this segment of ascending colon. Ascending colon measures 7.5 cm in diameter. Cecum and terminal ileum are normal. No evidence of free peritoneal air. Vascular/Lymphatic: Moderate calcified plaque over the abdominal aorta which is otherwise normal in caliber. Calcified plaque over the superior mesenteric artery which is otherwise patent. No adenopathy. Reproductive: Previous hysterectomy. Other: Somewhat thickened bladder wall as the bladder is under distended. Small amount of free fluid in the pelvis. Musculoskeletal: Degenerative changes of the spine. There is a progressive destructive lytic process centered over the L3-4 disc space as findings are worse compared to the prior exam from 09/10/2019. Findings suggest an active process at this site which may be due to infection. This was previously evaluated by MRI 07/19/2019 and 08/23/2019 and thought to be stable at that point. Stable patchy lucency over L5 and the sacrum. Mild degenerative change of the hips. IMPRESSION: 1. Mildly distended ascending colon  with wall thickening and associated evidence of pneumatosis coli. No free peritoneal air. No evidence of small-bowel obstruction. No evidence of mesenteric thrombosis/occlusion. Findings may be due to mesenteric ischemia on or related to infectious colitis. 2. Progressive destruction of the L3 and L4 vertebral bodies centered over the disc space. This was thought to be stable on previous lumbar spine MRI August and October 2020, although has shown interval progression as infection is possible. 3. Mild wall thickening of the distal esophagus and stomach which may be infectious or inflammatory nature. 4.  Several left renal cysts unchanged. 5.  Aortic Atherosclerosis (ICD10-I70.0). Electronically Signed: By: Marin Olp M.D. On: 11/16/2019 09:20   Medications: . sodium chloride    . sodium chloride    . ceFEPime (MAXIPIME) IV 1 g (11/15/19 2129)   . acetaminophen  500 mg Oral TID  . calcium carbonate  1 tablet Oral 2 times per day  . carvedilol  12.5 mg Oral BID  . Chlorhexidine Gluconate Cloth  6 each Topical Q0600  .  darbepoetin (ARANESP) injection - DIALYSIS  60 mcg Intravenous Q Mon-HD  . doxercalciferol  1.5 mcg Intravenous Q M,W,F-HD  . feeding supplement (ENSURE ENLIVE)  237 mL Oral BID BM  . insulin aspart  0-9 Units Subcutaneous TID WC  . isosorbide mononitrate  30 mg Oral QPC lunch  . lactulose  20 g Oral TID  . Melatonin  3 mg Oral QHS  . multivitamin  1 tablet Oral QPC lunch  . pantoprazole  40 mg Oral Daily  . Warfarin - Pharmacist Dosing Inpatient   Does not apply [phone]

## 2019-11-17 DIAGNOSIS — M4646 Discitis, unspecified, lumbar region: Secondary | ICD-10-CM

## 2019-11-17 DIAGNOSIS — K5981 Ogilvie syndrome: Secondary | ICD-10-CM

## 2019-11-17 DIAGNOSIS — I748 Embolism and thrombosis of other arteries: Secondary | ICD-10-CM

## 2019-11-17 DIAGNOSIS — G061 Intraspinal abscess and granuloma: Secondary | ICD-10-CM

## 2019-11-17 DIAGNOSIS — Z8619 Personal history of other infectious and parasitic diseases: Secondary | ICD-10-CM

## 2019-11-17 DIAGNOSIS — K6812 Psoas muscle abscess: Secondary | ICD-10-CM

## 2019-11-17 DIAGNOSIS — M4626 Osteomyelitis of vertebra, lumbar region: Secondary | ICD-10-CM

## 2019-11-17 DIAGNOSIS — R011 Cardiac murmur, unspecified: Secondary | ICD-10-CM

## 2019-11-17 DIAGNOSIS — E1122 Type 2 diabetes mellitus with diabetic chronic kidney disease: Secondary | ICD-10-CM

## 2019-11-17 LAB — BASIC METABOLIC PANEL
Anion gap: 17 — ABNORMAL HIGH (ref 5–15)
BUN: 30 mg/dL — ABNORMAL HIGH (ref 8–23)
CO2: 23 mmol/L (ref 22–32)
Calcium: 8.5 mg/dL — ABNORMAL LOW (ref 8.9–10.3)
Chloride: 96 mmol/L — ABNORMAL LOW (ref 98–111)
Creatinine, Ser: 5.67 mg/dL — ABNORMAL HIGH (ref 0.44–1.00)
GFR calc Af Amer: 8 mL/min — ABNORMAL LOW (ref >60)
GFR calc non Af Amer: 7 mL/min — ABNORMAL LOW (ref >60)
Glucose, Bld: 150 mg/dL — ABNORMAL HIGH (ref 70–99)
Potassium: 3.9 mmol/L (ref 3.5–5.1)
Sodium: 136 mmol/L (ref 135–145)

## 2019-11-17 LAB — FIBRINOGEN: Fibrinogen: 740 mg/dL — ABNORMAL HIGH (ref 210–475)

## 2019-11-17 LAB — CBC
HCT: 27.1 % — ABNORMAL LOW (ref 36.0–46.0)
Hemoglobin: 8.4 g/dL — ABNORMAL LOW (ref 12.0–15.0)
MCH: 28.3 pg (ref 26.0–34.0)
MCHC: 31 g/dL (ref 30.0–36.0)
MCV: 91.2 fL (ref 80.0–100.0)
Platelets: 274 10*3/uL (ref 150–400)
RBC: 2.97 MIL/uL — ABNORMAL LOW (ref 3.87–5.11)
RDW: 18.2 % — ABNORMAL HIGH (ref 11.5–15.5)
WBC: 9.1 10*3/uL (ref 4.0–10.5)
nRBC: 0.2 % (ref 0.0–0.2)

## 2019-11-17 LAB — GLUCOSE, CAPILLARY
Glucose-Capillary: 151 mg/dL — ABNORMAL HIGH (ref 70–99)
Glucose-Capillary: 101 mg/dL — ABNORMAL HIGH (ref 70–99)
Glucose-Capillary: 157 mg/dL — ABNORMAL HIGH (ref 70–99)
Glucose-Capillary: 90 mg/dL (ref 70–99)

## 2019-11-17 LAB — PROTIME-INR
INR: 5.8 (ref 0.8–1.2)
Prothrombin Time: 52.2 seconds — ABNORMAL HIGH (ref 11.4–15.2)

## 2019-11-17 LAB — D-DIMER, QUANTITATIVE: D-Dimer, Quant: 4.98 ug/mL-FEU — ABNORMAL HIGH (ref 0.00–0.50)

## 2019-11-17 LAB — APTT: aPTT: 65 seconds — ABNORMAL HIGH (ref 24–36)

## 2019-11-17 MED ORDER — SODIUM CHLORIDE 0.9 % IV SOLN
1.0000 g | INTRAVENOUS | Status: DC
  Administered 2019-11-17 – 2019-12-04 (×18): 1 g via INTRAVENOUS
  Filled 2019-11-17 (×19): qty 1

## 2019-11-17 MED ORDER — PHYTONADIONE 1 MG/0.5 ML ORAL SOLUTION
1.0000 mg | Freq: Once | ORAL | Status: AC
  Administered 2019-11-17: 1 mg via ORAL
  Filled 2019-11-17: qty 0.5

## 2019-11-17 NOTE — Progress Notes (Signed)
Pharmacy Antibiotic Note  [name] is a 68 y.o. female admitted on 10/20/19 with Pseudomonas bacteremia and MV IE.  Pharmacy has been consulted for ceftazidime dosing.  The patient is ESRD-MWF. Noted ID plans for antibiotics through 12/06/19. Cipro DC'd on 12/15 given witnessed seizure. ID changing to ceftazidime. Plan to switch to dosing with HD upon discharge.   Plan: - Change ceftazidime 1g IV every 24 hours after HD until 12/06/2019 - Switch ceftazidime to 2g after HD Mon/Wed, 3g after HD Fridays at discharge - Continue to monitor clinical status  Height: 5\' 7"  (170.2 cm) Weight: 130 lb 8.2 oz (59.2 kg) IBW/kg (Calculated) : 61.6  Temp (24hrs), Avg:98.3 F (36.8 C), Min:98.1 F (36.7 C), Max:98.5 F (36.9 C)  Recent Labs  Lab 11/10/19 1759 11/13/19 1134 11/13/19 1248 11/15/19 1036 11/17/19 0859  WBC 12.2* 12.6*  --  12.6* 9.1  CREATININE 3.17*  --  8.41* 6.38* 5.67*    Estimated Creatinine Clearance: 8.8 mL/min (A) (by C-G formula based on SCr of 5.67 mg/dL (H)).    No Known Allergies  Antimicrobials this admission: Cefepime 11/30 >> 12/25 Flluconazole 12/16  Cipro 12/3 >> 12/14 Ceftaz 12/25 >> (12/05/18)  Microbiology results: 11/27 COVID >> neg 11/29 BCx >> pseudomonas 4/8 (pan sens) 12/1 Bcx>>neg 12/6 BCx: neg 12/14 UCx: yeast    Thank you for involving pharmacy in this patient's care.  Renold Genta, PharmD, BCPS Clinical Pharmacist Clinical phone for 11/17/2019 until 3p is [phone] 11/17/2019 2:45 PM  **Pharmacist phone directory can be found on [email or web address] listed under [email or web address]**

## 2019-11-17 NOTE — Progress Notes (Signed)
ANTICOAGULATION CONSULT NOTE - Follow-Up  Pharmacy Consult for Warfarin Indication: RIJ DVT (10/21/19), CVA (10/20/19)  No Known Allergies  Patient Measurements: Height: 5\' 7"  (170.2 cm) Weight: 130 lb 8.2 oz (59.2 kg) IBW/kg (Calculated) : 61.6  Vital Signs: Temp: 98.5 F (36.9 C) (12/25 0546) Temp Source: Oral (12/25 0546) BP: 109/68 (12/25 0546) Pulse Rate: 81 (12/25 0546)  Labs: Recent Labs    11/15/19 0718 11/15/19 1036 11/16/19 0558 11/17/19 0504  HGB  --  8.5*  --   --   HCT  --  26.3*  --   --   PLT  --  241  --   --   LABPROT 50.5*  --  47.6* 52.2*  INR 5.6*  --  5.2* 5.8*  CREATININE  --  6.38*  --   --     Estimated Creatinine Clearance: 7.8 mL/min (A) (by C-G formula based on SCr of 6.38 mg/dL (H)).  Assessment: 68 yr old female with ESRD who presented on 10/20/19 with new CVA and RIJ DVT 10/21/19. Pharmacy consulted to dose warfarin.   INR continues to remains SUPRAtherapeutic at 5.8 and increased from 5.2 yesterday. Warfarin has been held since 12/19. On 12/23 discussed with MD, no active bleeding, high clotting risk and low bleeding risk overall.  Given her clot much outweighs bleed risk, would prefer to not give vitamin K if possible. Patient has poor appetite but is drinking some Ensure, which has small amounts (24mcg) of vitamin K. Discussed that the high INR is likely due to vitamin K deficiency from poor PO intake.  Today (12/25) given rise in INR spoke with MD who would like to give the lowest dose possible of vitamin K. No current bleeding per RN. CBC stable.   Goal of Therapy:  INR 2-3 Monitor platelets by anticoagulation protocol: Yes   Plan:  - HOLD warfarin x 1 day given supratherapeutic INR - Administer vitamin K 1mg  orally per MD - Daily PT/INR, CBC Q Mon - Monitor for signs/symptoms of bleeding  Cristela Felt, PharmD PGY1 Pharmacy Resident [employer]: [phone]
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
## 2019-11-17 NOTE — Progress Notes (Signed)
CRITICAL VALUE ALERT  Critical Value:  INR 5.8  Date & Time Notied:  11/17/19 at [place]  Provider Notified: Danella Sensing, NP  Orders Received/Actions taken: NP inquired if patient was actively bleeding. Informed NP that pt is not actively bleeding and has been on bed rest. NP said she would contact Dr. Posey Pronto about lab result

## 2019-11-17 NOTE — Progress Notes (Signed)
PROGRESS NOTE    [name]  [id number] DOB: 10-19-1951 DOA: 11/01/2019 PCP: Manon Hilding, MD   Brief Narrative: [name] is a 68 y.o. female with a history of ESRD on HD, mitral valve endocarditis, hepatic encephalopathy, chronic anemia, diabetes mellitus, CVA. Patient is currently admitted to inpatient rehab for post-CVA rehabilitation and was found to have worsening mentation.   Assessment & Plan:   Principal Problem:   Cerebrovascular accident (CVA) of right basal ganglia (HCC) Active Problems:   Slow transit constipation   Acute on chronic anemia   Labile blood pressure   Labile blood glucose   Diabetes mellitus type 2 in nonobese (HCC)   Bacteremia   Endocarditis of mitral valve   Septic embolism (HCC)   Seizure (HCC)   Candidiasis   Encephalopathy, hepatic (HCC)   Hyperammonemia (HCC)   Sleep disturbance   Hemodialysis-associated hypotension   Supratherapeutic INR   Gastroesophageal reflux disease   Decubitus ulcer of sacral region, unstageable ([place])   Encephalopathy Unknown etiology. Thought possibly secondary to delirium. Appears to be improved today but just somnolent possibly secondary to Seroquel overnight. Appears to be resolved. -Continue lactulose  Diabetes mellitus, type 2 Patient is on insulin NPH-regular (70-30) 4 units BID WC as an outpatient. Currently on SSI. Blood sugar somewhat controlled. -Continue SSI for now  Hyperammonemia History of hepatic encephalopathy -Continue lactulose  Abdominal pain Initially with symptoms on 12/23. Abdominal X-ray concerning for volvulus and CT scan significant for evidence of pneumatosis coli. 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 Abdominal pain is improved from early but she still is tender in the RUQ. -General surgery recommendations: GI consult  Discitis No trauma noted. In setting of bacterial endocarditis. Patient does have a history of sacral  decubitus ulcer. Afebrile and normal WBC. Pelvic x-ray not suggestive of fracture. MRI obtained which shows concern for infectious process. Neurosurgery consulted and their concern is discitis; recommending ID re-consult. Patient has been afebrile and WBC is normal today. -Re-Consult ID  Coagulopathy Secondary to Coumadin. INR appears to have reached plateau . No obvious source of bleeding. Hemoglobin stable. 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 Evaluated by neurology with LT EEG. Gabapentin discontinued. No reported recurrences.   Right IJ DVT Acute from hospitalization in November. Started on Coumadin.  Mitral valve endocarditis Seen by cardiothoracic surgery on medical admission. No plan for replacement valve at this acute period but will need follow-up as an outpatient. On Cefepime per infectious disease with last recommended dose on 12/06/2019.  Essential hypertension -Continue Imdur  CVA Recently hospitalized and is now undergoing inpatient rehabilitation. Per stroke team, does not appear aspirin was considered for continuation on discharge. -Continue Coumadin  Pressure injury Medial sacrum, not POA    DVT prophylaxis: Coumadin (currently held secondary to supertherapeutic INR) Code Status:   Code Status: DNR Family Communication: None at bedside   Procedures:   HD  Antimicrobials:  Cefepime    Subjective: Back pain slightly improved. Has some lower abdominal pain.  Objective: Vitals:   11/15/19 2018 11/16/19 0605 11/16/19 1930 11/17/19 0546  BP: 104/63 113/71 101/63 109/68  Pulse: 95 89 89 81  Resp: 16 18 18 20   Temp: 98.8 F (37.1 C) 99 F (37.2 C) 98.1 F (36.7 C)  98.5 F (36.9 C)  TempSrc:    Oral  SpO2: 97% 98% 99% 99%    Weight:      Height:        Intake/Output Summary (Last 24 hours) at 11/17/2019 0911 Last data filed at 11/16/2019 2000 Gross per 24 hour  Intake 120 ml  Output --  Net 120 ml   Filed Weights   11/14/19 0326 11/15/19 1318 11/15/19 1659  Weight: 59.9 kg 60 kg 59.2 kg    Examination:  General exam: Appears calm and comfortable Respiratory system: Clear to auscultation. Respiratory effort normal. Cardiovascular system: S1 & S2 heard, RRR. No murmurs, rubs, gallops or clicks. Gastrointestinal system: Abdomen is nondistended, soft and tender in RUQ. Normal bowel sounds heard. Central nervous system: Alert and oriented. No focal neurological deficits. Extremities: No edema. No calf tenderness Skin: No cyanosis. No rashes Psychiatry: Judgement and insight appear normal. Mood & affect appropriate.       Data Reviewed: I have personally reviewed following labs and imaging studies  CBC: Recent Labs  Lab 11/10/19 1405 11/10/19 1759 11/13/19 1134 11/15/19 1036 11/17/19 0859  WBC 10.0 12.2* 12.6* 12.6* 9.1  HGB 9.4* 10.8* 9.1* 8.5* 8.4*  HCT 30.1* 34.0* 28.4* 26.3* 27.1*  MCV 94.4 91.9 91.0 88.9 91.2  PLT 215 209 199 241 [id number]   Basic Metabolic Panel: Recent Labs  Lab 11/10/19 1405 11/10/19 1759 11/13/19 1248 11/15/19 1036  NA 136 134* 131* 135  K 3.9 3.3* 4.4 3.8  CL 95* 93* 94* 96*  CO2 26 27 22 22   GLUCOSE 168* 173* 198* 138*  BUN 33* 9 57* 35*  CREATININE 7.14* 3.17* 8.41* 6.38*  CALCIUM 8.7* 8.5* 8.3* 8.6*  PHOS 3.5 2.2* 5.2*  --    GFR: Estimated Creatinine Clearance: 7.8 mL/min (A) (by C-G formula based on SCr of 6.38 mg/dL (H)). Liver Function Tests: Recent Labs  Lab 11/10/19 1405 11/10/19 1759 11/13/19 1248 11/15/19 1036  AST  --   --   --  20  ALT  --   --   --  57*  ALKPHOS  --   --   --  111  BILITOT  --   --   --  0.4  PROT  --   --   --  6.6  ALBUMIN 2.2* 2.4* 1.7* 2.0*   No results for input(s): LIPASE, AMYLASE in the last 168  hours. Recent Labs  Lab 11/14/19 0522 11/15/19 1036  AMMONIA 25 33   Coagulation Profile: Recent Labs  Lab 11/13/19 1134 11/14/19 0522 11/15/19 0718 11/16/19 0558 11/17/19 0504  INR 5.3* 5.2* 5.6* 5.2* 5.8*   Cardiac Enzymes: No results for input(s): CKTOTAL, CKMB, CKMBINDEX, TROPONINI in the last 168 hours. BNP (last 3 results) No results for input(s): PROBNP in the last 8760 hours. HbA1C: No results for input(s): HGBA1C in the last 72 hours. CBG: Recent Labs  Lab 11/16/19 0607 11/16/19 1211 11/16/19 1654 11/16/19 2125 11/17/19 0618  GLUCAP 91 96 135* 195* 151*   Lipid Profile: Recent Labs    11/15/19 0718  CHOL 120  HDL 28*  LDLCALC 57  TRIG 177*  CHOLHDL 4.3   Thyroid Function Tests: No results for input(s): TSH, T4TOTAL, FREET4, T3FREE, THYROIDAB in the last 72 hours. Anemia Panel: No results for input(s): VITAMINB12, FOLATE, FERRITIN, TIBC, IRON, RETICCTPCT in the last 72 hours. Sepsis Labs: No results for input(s): PROCALCITON, LATICACIDVEN in the last 168 hours.  No results found for this or any previous visit (  from the past 240 hour(s)).       Radiology Studies: DG Pelvis 1-2 Views  Result Date: 11/15/2019 CLINICAL DATA:  Abdominal and pelvic pain for 1 day EXAM: PELVIS - 1-2 VIEW COMPARISON:  None. FINDINGS: There is marked colonic distention better detailed on dedicated abdominal radiograph. Few air distended loops of colon are noted in the pelvis. No acute osseous or soft tissue abnormality is seen. Extensive degenerative changes noted in the SI joints and both hips. The osseous structures appear diffusely demineralized which may limit detection of small or nondisplaced fractures. Atherosclerotic calcification of the inflow and proximal outflow arteries and numerous phleboliths are present in the pelvis. IMPRESSION: 1. Marked colonic distention, better detailed on dedicated abdominal radiograph. 2. The osseous structures appear diffusely  demineralized which may limit detection of small or nondisplaced fractures. 3. No acute osseous or other soft tissue abnormality. Electronically Signed   By: Lovena Le M.D.   On: 11/15/2019 20:42   DG Abd 1 View  Result Date: 11/15/2019 CLINICAL DATA:  Generalized abdomen and pelvic pain for 1 day EXAM: ABDOMEN - 1 VIEW COMPARISON:  Radiograph 11/14/2019 FINDINGS: Increasing air distention of the colon. Some crescentic lucency is noted along the margins of a 1 of the distended colonic loops worrisome for developing pneumatosis. Additional segments of colon are air and stool filled. Small bowel gas pattern is largely obscured at this time. Multilevel degenerative changes noted in the spine hips and pelvis. Phleboliths in the pelvis. Atherosclerotic calcification of the iliac branches. IMPRESSION: Increasing air distention of the colon parents with an appearance worrisome for a volvulus now with crescentic mural lucency suspicious for developing pneumatosis/possible pressure necrosis. A contrast-enhanced CT would provide optimal evaluation. Unenhanced CT could provide useful clinical information if unable to administer contrast media. These results will be called to the ordering clinician or representative by the Radiologist Assistant, and communication documented in the PACS or zVision Dashboard. Electronically Signed   By: Lovena Le M.D.   On: 11/15/2019 20:40   MR LUMBAR SPINE WO CONTRAST  Result Date: 11/16/2019 CLINICAL DATA:  Abnormal CT showing possible infection EXAM: MRI LUMBAR SPINE WITHOUT CONTRAST TECHNIQUE: Multiplanar, multisequence MR imaging of the lumbar spine was performed. No intravenous contrast was administered. COMPARISON:  08/23/2019 MRI, CT abdomen 11/16/2019 FINDINGS: Segmentation:  Standard. Alignment:  Stable Vertebrae: Diffusely abnormal marrow signal is again identified with marked heterogeneity. There is progression of abnormal signal particularly at the L3 and L4 levels,  noting progression of erosive changes on same day CT. Conus medullaris and cauda equina: Conus extends to the L1-L2 level. Conus and cauda equina appear normal. Paraspinal and other soft tissues: Edema within the left greater than right psoas muscles extending inferiorly from the L3 level. There is a 1 cm area of T2 hyperintensity extending laterally from the L3-L4 disc level into the psoas muscle. Disc levels: Congenital narrowing of the spinal canal. Stable appearance of L1-L2, L2-L3, L4-L5, and L5-S1. L3-L4: Disc bulge, endplate retropulsion, and facet arthropathy. Similar moderate canal stenosis with narrowing of lateral recesses. Increased effacement of neural foramina. IMPRESSION: Overall abnormal marrow signal is again likely due to renal osteodystrophy. However, progression of abnormal signal and endplate destruction at L3-L4 is suspicious for superimposed infection given new paraspinal edema and possible small collection extending laterally from the L3-L4 disc level into the left psoas muscle. Electronically Signed   By: Macy Mis M.D.   On: 11/16/2019 12:42   CT ABDOMEN PELVIS W CONTRAST  Addendum Date:  11/16/2019   ADDENDUM REPORT: 11/16/2019 10:00 ADDENDUM: Critical Value/emergent results were called by telephone at the time of interpretation on 11/16/2019 at 10:00 am to Titus , who verbally acknowledged these results. Electronically Signed   By: Marin Olp M.D.   On: 11/16/2019 10:00   Result Date: 11/16/2019 CLINICAL DATA:  Abdominal distension. Suspect bowel obstruction. Possible pneumatosis on KUB. EXAM: CT ABDOMEN AND PELVIS WITH CONTRAST TECHNIQUE: Multidetector CT imaging of the abdomen and pelvis was performed using the standard protocol following bolus administration of intravenous contrast. CONTRAST:  48mL OMNIPAQUE IOHEXOL 300 MG/ML  SOLN COMPARISON:  09/10/2019 FINDINGS: Lower chest: Linear atelectasis over the posterior right lower lobe. Subtle posterior  bibasilar atelectasis. Focal 9 mm stippled calcification over the posterior left lower lobe abutting the pleura. Evidence of patient's right IJ dialysis catheter with tip over the inferior cavoatrial junction. Mild calcification over the mitral valve annulus. Calcified plaque over the right coronary artery and minimally over the descending thoracic aorta. Hepatobiliary: Liver and gallbladder are normal. Possible tiny fleck of air over the porta hepatis as the biliary tree is otherwise unremarkable. Pancreas: Normal. Spleen: Normal. Adrenals/Urinary Tract: Adrenal glands are normal. Kidneys demonstrate no evidence of hydronephrosis or nephrolithiasis. Several left renal cysts unchanged. Bladder and ureters are unremarkable. Stomach/Bowel: Suggestion of mild wall thickening of the distal esophagus and stomach. Small bowel is normal caliber and otherwise unremarkable. Cecum is located over the midline lower abdomen. Appendix is within normal. There is wall thickening over the ascending colon with findings suggesting pneumatosis over this segment of ascending colon. Ascending colon measures 7.5 cm in diameter. Cecum and terminal ileum are normal. No evidence of free peritoneal air. Vascular/Lymphatic: Moderate calcified plaque over the abdominal aorta which is otherwise normal in caliber. Calcified plaque over the superior mesenteric artery which is otherwise patent. No adenopathy. Reproductive: Previous hysterectomy. Other: Somewhat thickened bladder wall as the bladder is under distended. Small amount of free fluid in the pelvis. Musculoskeletal: Degenerative changes of the spine. There is a progressive destructive lytic process centered over the L3-4 disc space as findings are worse compared to the prior exam from 09/10/2019. Findings suggest an active process at this site which may be due to infection. This was previously evaluated by MRI 07/19/2019 and 08/23/2019 and thought to be stable at that point. Stable  patchy lucency over L5 and the sacrum. Mild degenerative change of the hips. IMPRESSION: 1. Mildly distended ascending colon with wall thickening and associated evidence of pneumatosis coli. No free peritoneal air. No evidence of small-bowel obstruction. No evidence of mesenteric thrombosis/occlusion. Findings may be due to mesenteric ischemia on or related to infectious colitis. 2. Progressive destruction of the L3 and L4 vertebral bodies centered over the disc space. This was thought to be stable on previous lumbar spine MRI August and October 2020, although has shown interval progression as infection is possible. 3. Mild wall thickening of the distal esophagus and stomach which may be infectious or inflammatory nature. 4.  Several left renal cysts unchanged. 5.  Aortic Atherosclerosis (ICD10-I70.0). Electronically Signed: By: Marin Olp M.D. On: 11/16/2019 09:20        Scheduled Meds: . acetaminophen  500 mg Oral TID  . calcium carbonate  1 tablet Oral 2 times per day  . carvedilol  12.5 mg Oral BID  . Chlorhexidine Gluconate Cloth  6 each Topical Q0600  . [START ON 11/20/2019] darbepoetin (ARANESP) injection - DIALYSIS  100 mcg Intravenous Q Mon-HD  . doxercalciferol  1.5 mcg Intravenous Q M,W,F-HD  . feeding supplement (ENSURE ENLIVE)  237 mL Oral BID BM  . insulin aspart  0-9 Units Subcutaneous TID WC  . isosorbide mononitrate  30 mg Oral QPC lunch  . lactulose  20 g Oral TID  . Melatonin  3 mg Oral QHS  . multivitamin  1 tablet Oral QPC lunch  . pantoprazole  40 mg Oral Daily  . phytonadione  1 mg Oral Once  . Warfarin - Pharmacist Dosing Inpatient   Does not apply q1800   Continuous Infusions: . sodium chloride    . sodium chloride    . ceFEPime (MAXIPIME) IV 1 g (11/16/19 2013)  . [START ON 11/20/2019] ferric gluconate (FERRLECIT/NULECIT) IV       LOS: 16 days     Cordelia Poche, MD [facility] 11/17/2019, 9:11 AM  If 7PM-7AM, please contact  night-coverage [email or web address]

## 2019-11-17 NOTE — Progress Notes (Addendum)
INFECTIOUS DISEASE PROGRESS NOTE  ID: [name] is a 68 y.o. female with  Principal Problem:   Cerebrovascular accident (CVA) of right basal ganglia (HCC) Active Problems:   Slow transit constipation   Acute on chronic anemia   Labile blood pressure   Labile blood glucose   Diabetes mellitus type 2 in nonobese (HCC)   Bacteremia   Endocarditis of mitral valve   Septic embolism (HCC)   Seizure (HCC)   Candidiasis   Encephalopathy, hepatic (HCC)   Hyperammonemia (HCC)   Sleep disturbance   Hemodialysis-associated hypotension   Supratherapeutic INR   Gastroesophageal reflux disease   Decubitus ulcer of sacral region, unstageable ([place])   Ogilvie's syndrome  Subjective: 68 yo F with hx of pseudomonas MV IE (BCx 11-29), CNS septic emboli (R basal ganglia). She was treated with cefepime and cipro however due to concern over seizure, the cipro was stopped (12-15).  She was last seen by ID on 12-22. ANbx end date 12-06-19  She had CT abd for several days of abd pain, loose stools. She is noted to have a ? Of colonic pneumatosis and progressive destruction of L3/4.  She had MRI to f/u: progression of abnormal signal and endplate destruction at L3-L4 is suspicious for superimposed infection given new paraspinal edema and possible small collection extending laterally from the L3-L4 disc level into the left psoas muscle. She was seen by neurosurgery who noted that this lesion has been progressive in review of scans since 06-2019.  Her course if further complicated by supra-therapeutic INR (5.8).   She also has a hx of ESRD. HD cath changed 10-26-19 No complaints. Back pain is worse and better.    Abtx:  Anti-infectives (From admission, onward)   Start     Dose/Rate Route Frequency Ordered Stop   11/08/19 1400  fluconazole (DIFLUCAN) tablet 100 mg  Status:  Discontinued     100 mg Oral Every 24 hours 11/08/19 1059 11/09/19 1101   11/01/19 2000  ceFEPIme (MAXIPIME) 1 g in sodium  chloride 0.9 % 100 mL IVPB     1 g 200 mL/hr over 30 Minutes Intravenous Every 24 hours 11/01/19 1715     11/01/19 1800  ciprofloxacin (CIPRO) tablet 500 mg  Status:  Discontinued     500 mg Oral Daily-1800 11/01/19 1715 11/07/19 0914      Medications:  Scheduled: . acetaminophen  500 mg Oral TID  . calcium carbonate  1 tablet Oral 2 times per day  . carvedilol  12.5 mg Oral BID  . Chlorhexidine Gluconate Cloth  6 each Topical Q0600  . [START ON 11/20/2019] darbepoetin (ARANESP) injection - DIALYSIS  100 mcg Intravenous Q Mon-HD  . doxercalciferol  1.5 mcg Intravenous Q M,W,F-HD  . feeding supplement (ENSURE ENLIVE)  237 mL Oral BID BM  . insulin aspart  0-9 Units Subcutaneous TID WC  . isosorbide mononitrate  30 mg Oral QPC lunch  . lactulose  20 g Oral TID  . Melatonin  3 mg Oral QHS  . multivitamin  1 tablet Oral QPC lunch  . pantoprazole  40 mg Oral Daily  . Warfarin - Pharmacist Dosing Inpatient   Does not apply q1800    Objective: Vital signs in last 24 hours: Temp:  [98.1 F (36.7 C)-98.5 F (36.9 C)] 98.5 F (36.9 C) (12/25 0546) Pulse Rate:  [81-89] 81 (12/25 0546) Resp:  [18-20] 20 (12/25 0546) BP: (101-109)/(63-68) 109/68 (12/25 0546) SpO2:  [99 %] 99 % (12/25 0546)  General appearance: fatigued and no distress Resp: clear to auscultation bilaterally Chest wall: no tenderness, HD line site is clean. non-tender.  Cardio: regular rate and rhythm and systolic murmur: early systolic 3/6, crescendo at 2nd left intercostal space GI: normal findings: bowel sounds normal and soft, non-tender and abnormal findings:  distended Extremities: edema none  Lab Results Recent Labs    11/15/19 1036 11/17/19 0859  WBC 12.6* 9.1  HGB 8.5* 8.4*  HCT 26.3* 27.1*  NA 135 136  K 3.8 3.9  CL 96* 96*  CO2 22 23  BUN 35* 30*  CREATININE 6.38* 5.67*   Liver Panel Recent Labs    11/15/19 1036  PROT 6.6  ALBUMIN 2.0*  AST 20  ALT 57*  ALKPHOS 111  BILITOT 0.4    Sedimentation Rate No results for input(s): ESRSEDRATE in the last 72 hours. C-Reactive Protein No results for input(s): CRP in the last 72 hours.  Microbiology: No results found for this or any previous visit (from the past 240 hour(s)).  Studies/Results: DG Pelvis 1-2 Views  Result Date: 11/15/2019 CLINICAL DATA:  Abdominal and pelvic pain for 1 day EXAM: PELVIS - 1-2 VIEW COMPARISON:  None. FINDINGS: There is marked colonic distention better detailed on dedicated abdominal radiograph. Few air distended loops of colon are noted in the pelvis. No acute osseous or soft tissue abnormality is seen. Extensive degenerative changes noted in the SI joints and both hips. The osseous structures appear diffusely demineralized which may limit detection of small or nondisplaced fractures. Atherosclerotic calcification of the inflow and proximal outflow arteries and numerous phleboliths are present in the pelvis. IMPRESSION: 1. Marked colonic distention, better detailed on dedicated abdominal radiograph. 2. The osseous structures appear diffusely demineralized which may limit detection of small or nondisplaced fractures. 3. No acute osseous or other soft tissue abnormality. Electronically Signed   By: Lovena Le M.D.   On: 11/15/2019 20:42   DG Abd 1 View  Result Date: 11/15/2019 CLINICAL DATA:  Generalized abdomen and pelvic pain for 1 day EXAM: ABDOMEN - 1 VIEW COMPARISON:  Radiograph 11/14/2019 FINDINGS: Increasing air distention of the colon. Some crescentic lucency is noted along the margins of a 1 of the distended colonic loops worrisome for developing pneumatosis. Additional segments of colon are air and stool filled. Small bowel gas pattern is largely obscured at this time. Multilevel degenerative changes noted in the spine hips and pelvis. Phleboliths in the pelvis. Atherosclerotic calcification of the iliac branches. IMPRESSION: Increasing air distention of the colon parents with an appearance  worrisome for a volvulus now with crescentic mural lucency suspicious for developing pneumatosis/possible pressure necrosis. A contrast-enhanced CT would provide optimal evaluation. Unenhanced CT could provide useful clinical information if unable to administer contrast media. These results will be called to the ordering clinician or representative by the Radiologist Assistant, and communication documented in the PACS or zVision Dashboard. Electronically Signed   By: Lovena Le M.D.   On: 11/15/2019 20:40   MR LUMBAR SPINE WO CONTRAST  Result Date: 11/16/2019 CLINICAL DATA:  Abnormal CT showing possible infection EXAM: MRI LUMBAR SPINE WITHOUT CONTRAST TECHNIQUE: Multiplanar, multisequence MR imaging of the lumbar spine was performed. No intravenous contrast was administered. COMPARISON:  08/23/2019 MRI, CT abdomen 11/16/2019 FINDINGS: Segmentation:  Standard. Alignment:  Stable Vertebrae: Diffusely abnormal marrow signal is again identified with marked heterogeneity. There is progression of abnormal signal particularly at the L3 and L4 levels, noting progression of erosive changes on same day CT. Conus medullaris  and cauda equina: Conus extends to the L1-L2 level. Conus and cauda equina appear normal. Paraspinal and other soft tissues: Edema within the left greater than right psoas muscles extending inferiorly from the L3 level. There is a 1 cm area of T2 hyperintensity extending laterally from the L3-L4 disc level into the psoas muscle. Disc levels: Congenital narrowing of the spinal canal. Stable appearance of L1-L2, L2-L3, L4-L5, and L5-S1. L3-L4: Disc bulge, endplate retropulsion, and facet arthropathy. Similar moderate canal stenosis with narrowing of lateral recesses. Increased effacement of neural foramina. IMPRESSION: Overall abnormal marrow signal is again likely due to renal osteodystrophy. However, progression of abnormal signal and endplate destruction at L3-L4 is suspicious for superimposed  infection given new paraspinal edema and possible small collection extending laterally from the L3-L4 disc level into the left psoas muscle. Electronically Signed   By: Macy Mis M.D.   On: 11/16/2019 12:42   CT ABDOMEN PELVIS W CONTRAST  Addendum Date: 11/16/2019   ADDENDUM REPORT: 11/16/2019 10:00 ADDENDUM: Critical Value/emergent results were called by telephone at the time of interpretation on 11/16/2019 at 10:00 am to [place] , who verbally acknowledged these results. Electronically Signed   By: Marin Olp M.D.   On: 11/16/2019 10:00   Result Date: 11/16/2019 CLINICAL DATA:  Abdominal distension. Suspect bowel obstruction. Possible pneumatosis on KUB. EXAM: CT ABDOMEN AND PELVIS WITH CONTRAST TECHNIQUE: Multidetector CT imaging of the abdomen and pelvis was performed using the standard protocol following bolus administration of intravenous contrast. CONTRAST:  18mL OMNIPAQUE IOHEXOL 300 MG/ML  SOLN COMPARISON:  09/10/2019 FINDINGS: Lower chest: Linear atelectasis over the posterior right lower lobe. Subtle posterior bibasilar atelectasis. Focal 9 mm stippled calcification over the posterior left lower lobe abutting the pleura. Evidence of patient's right IJ dialysis catheter with tip over the inferior cavoatrial junction. Mild calcification over the mitral valve annulus. Calcified plaque over the right coronary artery and minimally over the descending thoracic aorta. Hepatobiliary: Liver and gallbladder are normal. Possible tiny fleck of air over the porta hepatis as the biliary tree is otherwise unremarkable. Pancreas: Normal. Spleen: Normal. Adrenals/Urinary Tract: Adrenal glands are normal. Kidneys demonstrate no evidence of hydronephrosis or nephrolithiasis. Several left renal cysts unchanged. Bladder and ureters are unremarkable. Stomach/Bowel: Suggestion of mild wall thickening of the distal esophagus and stomach. Small bowel is normal caliber and otherwise unremarkable. Cecum  is located over the midline lower abdomen. Appendix is within normal. There is wall thickening over the ascending colon with findings suggesting pneumatosis over this segment of ascending colon. Ascending colon measures 7.5 cm in diameter. Cecum and terminal ileum are normal. No evidence of free peritoneal air. Vascular/Lymphatic: Moderate calcified plaque over the abdominal aorta which is otherwise normal in caliber. Calcified plaque over the superior mesenteric artery which is otherwise patent. No adenopathy. Reproductive: Previous hysterectomy. Other: Somewhat thickened bladder wall as the bladder is under distended. Small amount of free fluid in the pelvis. Musculoskeletal: Degenerative changes of the spine. There is a progressive destructive lytic process centered over the L3-4 disc space as findings are worse compared to the prior exam from 09/10/2019. Findings suggest an active process at this site which may be due to infection. This was previously evaluated by MRI 07/19/2019 and 08/23/2019 and thought to be stable at that point. Stable patchy lucency over L5 and the sacrum. Mild degenerative change of the hips. IMPRESSION: 1. Mildly distended ascending colon with wall thickening and associated evidence of pneumatosis coli. No free peritoneal air. No evidence of  small-bowel obstruction. No evidence of mesenteric thrombosis/occlusion. Findings may be due to mesenteric ischemia on or related to infectious colitis. 2. Progressive destruction of the L3 and L4 vertebral bodies centered over the disc space. This was thought to be stable on previous lumbar spine MRI August and October 2020, although has shown interval progression as infection is possible. 3. Mild wall thickening of the distal esophagus and stomach which may be infectious or inflammatory nature. 4.  Several left renal cysts unchanged. 5.  Aortic Atherosclerosis (ICD10-I70.0). Electronically Signed: By: Marin Olp M.D. 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   I suspect it would show the same organism and will not change her treatment.   Will follow with you.          Bobby Rumpf MD, FACP Infectious Diseases (pager) [phone] [email or web address] 11/17/2019, 1:50 PM  LOS: 16 days

## 2019-11-17 NOTE — Progress Notes (Signed)
Received cal from charge nurse, INR 5.8 11/17/2019. INR on 11/16/2019 5.2. Pharmacy dosing INR, Pharmacist recommended Vitamin K. Reviewed DR. Patel note from 11/16/19, he voiced concerned given her recent thrombus. Spoke with Mickel Baas RN, Ms. Chao has no active bleeding. This provider will speak with Dr. Posey Pronto regarding the above.

## 2019-11-17 NOTE — Progress Notes (Signed)
[facility] Progress Note     Subjective: CC-  Tired this morning. States that she has some mild lower abdominal discomfort, but it continues to improve. Denies n/v. BM yesterday. Tolerating diet but she cannot remember what or how much she ate yesterday.  Objective: Vital signs in last 24 hours: Temp:  [98.1 F (36.7 C)-98.5 F (36.9 C)] 98.5 F (36.9 C) (12/25 0546) Pulse Rate:  [81-89] 81 (12/25 0546) Resp:  [18-20] 20 (12/25 0546) BP: (101-109)/(63-68) 109/68 (12/25 0546) SpO2:  [99 %] 99 % (12/25 0546) Last BM Date: 11/15/19  Intake/Output from previous day: 12/24 0701 - 12/25 0700 In: 120 [P.O.:120] Out: -  Intake/Output this shift: No intake/output data recorded.  PE: Gen:  Alert but sleepy, NAD, pleasant HEENT: EOM's intact, pupils equal and round Card:  RRR Pulm:  CTAB, no W/R/R, effort normal Abd: Soft, mild distension, nontender, +BS, no HSM, no hernia Skin: no rashes noted, warm and dry  Lab Results:  Recent Labs    11/15/19 1036  WBC 12.6*  HGB 8.5*  HCT 26.3*  PLT 241   BMET Recent Labs    11/15/19 1036  NA 135  K 3.8  CL 96*  CO2 22  GLUCOSE 138*  BUN 35*  CREATININE 6.38*  CALCIUM 8.6*   PT/INR Recent Labs    11/16/19 0558 11/17/19 0504  LABPROT 47.6* 52.2*  INR 5.2* 5.8*   CMP     Component Value Date/Time   NA 135 11/15/2019 1036   K 3.8 11/15/2019 1036   CL 96 (L) 11/15/2019 1036   CO2 22 11/15/2019 1036   GLUCOSE 138 (H) 11/15/2019 1036   BUN 35 (H) 11/15/2019 1036   CREATININE 6.38 (H) 11/15/2019 1036   CALCIUM 8.6 (L) 11/15/2019 1036   PROT 6.6 11/15/2019 1036   ALBUMIN 2.0 (L) 11/15/2019 1036   AST 20 11/15/2019 1036   ALT 57 (H) 11/15/2019 1036   ALKPHOS 111 11/15/2019 1036   BILITOT 0.4 11/15/2019 1036   GFRNONAA 6 (L) 11/15/2019 1036   GFRAA 7 (L) 11/15/2019 1036   Lipase     Component Value Date/Time   LIPASE 41 09/10/2019 0015       Studies/Results: DG Pelvis 1-2 Views  Result  Date: 11/15/2019 CLINICAL DATA:  Abdominal and pelvic pain for 1 day EXAM: PELVIS - 1-2 VIEW COMPARISON:  None. FINDINGS: There is marked colonic distention better detailed on dedicated abdominal radiograph. Few air distended loops of colon are noted in the pelvis. No acute osseous or soft tissue abnormality is seen. Extensive degenerative changes noted in the SI joints and both hips. The osseous structures appear diffusely demineralized which may limit detection of small or nondisplaced fractures. Atherosclerotic calcification of the inflow and proximal outflow arteries and numerous phleboliths are present in the pelvis. IMPRESSION: 1. Marked colonic distention, better detailed on dedicated abdominal radiograph. 2. The osseous structures appear diffusely demineralized which may limit detection of small or nondisplaced fractures. 3. No acute osseous or other soft tissue abnormality. Electronically Signed   By: Lovena Le M.D.   On: 11/15/2019 20:42   DG Abd 1 View  Result Date: 11/15/2019 CLINICAL DATA:  Generalized abdomen and pelvic pain for 1 day EXAM: ABDOMEN - 1 VIEW COMPARISON:  Radiograph 11/14/2019 FINDINGS: Increasing air distention of the colon. Some crescentic lucency is noted along the margins of a 1 of the distended colonic loops worrisome for developing pneumatosis. Additional segments of colon are air and stool filled. Small bowel  gas pattern is largely obscured at this time. Multilevel degenerative changes noted in the spine hips and pelvis. Phleboliths in the pelvis. Atherosclerotic calcification of the iliac branches. IMPRESSION: Increasing air distention of the colon parents with an appearance worrisome for a volvulus now with crescentic mural lucency suspicious for developing pneumatosis/possible pressure necrosis. A contrast-enhanced CT would provide optimal evaluation. Unenhanced CT could provide useful clinical information if unable to administer contrast media. These results will be  called to the ordering clinician or representative by the Radiologist Assistant, and communication documented in the PACS or zVision Dashboard. Electronically Signed   By: Lovena Le M.D.   On: 11/15/2019 20:40   MR LUMBAR SPINE WO CONTRAST  Result Date: 11/16/2019 CLINICAL DATA:  Abnormal CT showing possible infection EXAM: MRI LUMBAR SPINE WITHOUT CONTRAST TECHNIQUE: Multiplanar, multisequence MR imaging of the lumbar spine was performed. No intravenous contrast was administered. COMPARISON:  08/23/2019 MRI, CT abdomen 11/16/2019 FINDINGS: Segmentation:  Standard. Alignment:  Stable Vertebrae: Diffusely abnormal marrow signal is again identified with marked heterogeneity. There is progression of abnormal signal particularly at the L3 and L4 levels, noting progression of erosive changes on same day CT. Conus medullaris and cauda equina: Conus extends to the L1-L2 level. Conus and cauda equina appear normal. Paraspinal and other soft tissues: Edema within the left greater than right psoas muscles extending inferiorly from the L3 level. There is a 1 cm area of T2 hyperintensity extending laterally from the L3-L4 disc level into the psoas muscle. Disc levels: Congenital narrowing of the spinal canal. Stable appearance of L1-L2, L2-L3, L4-L5, and L5-S1. L3-L4: Disc bulge, endplate retropulsion, and facet arthropathy. Similar moderate canal stenosis with narrowing of lateral recesses. Increased effacement of neural foramina. IMPRESSION: Overall abnormal marrow signal is again likely due to renal osteodystrophy. However, progression of abnormal signal and endplate destruction at L3-L4 is suspicious for superimposed infection given new paraspinal edema and possible small collection extending laterally from the L3-L4 disc level into the left psoas muscle. Electronically Signed   By: Macy Mis M.D.   On: 11/16/2019 12:42   CT ABDOMEN PELVIS W CONTRAST  Addendum Date: 11/16/2019   ADDENDUM REPORT: 11/16/2019  10:00 ADDENDUM: Critical Value/emergent results were called by telephone at the time of interpretation on 11/16/2019 at 10:00 am to [place] , who verbally acknowledged these results. Electronically Signed   By: Marin Olp M.D.   On: 11/16/2019 10:00   Result Date: 11/16/2019 CLINICAL DATA:  Abdominal distension. Suspect bowel obstruction. Possible pneumatosis on KUB. EXAM: CT ABDOMEN AND PELVIS WITH CONTRAST TECHNIQUE: Multidetector CT imaging of the abdomen and pelvis was performed using the standard protocol following bolus administration of intravenous contrast. CONTRAST:  62mL OMNIPAQUE IOHEXOL 300 MG/ML  SOLN COMPARISON:  09/10/2019 FINDINGS: Lower chest: Linear atelectasis over the posterior right lower lobe. Subtle posterior bibasilar atelectasis. Focal 9 mm stippled calcification over the posterior left lower lobe abutting the pleura. Evidence of patient's right IJ dialysis catheter with tip over the inferior cavoatrial junction. Mild calcification over the mitral valve annulus. Calcified plaque over the right coronary artery and minimally over the descending thoracic aorta. Hepatobiliary: Liver and gallbladder are normal. Possible tiny fleck of air over the porta hepatis as the biliary tree is otherwise unremarkable. Pancreas: Normal. Spleen: Normal. Adrenals/Urinary Tract: Adrenal glands are normal. Kidneys demonstrate no evidence of hydronephrosis or nephrolithiasis. Several left renal cysts unchanged. Bladder and ureters are unremarkable. Stomach/Bowel: Suggestion of mild wall thickening of the distal esophagus and stomach. Small  bowel is normal caliber and otherwise unremarkable. Cecum is located over the midline lower abdomen. Appendix is within normal. There is wall thickening over the ascending colon with findings suggesting pneumatosis over this segment of ascending colon. Ascending colon measures 7.5 cm in diameter. Cecum and terminal ileum are normal. No evidence of free  peritoneal air. Vascular/Lymphatic: Moderate calcified plaque over the abdominal aorta which is otherwise normal in caliber. Calcified plaque over the superior mesenteric artery which is otherwise patent. No adenopathy. Reproductive: Previous hysterectomy. Other: Somewhat thickened bladder wall as the bladder is under distended. Small amount of free fluid in the pelvis. Musculoskeletal: Degenerative changes of the spine. There is a progressive destructive lytic process centered over the L3-4 disc space as findings are worse compared to the prior exam from 09/10/2019. Findings suggest an active process at this site which may be due to infection. This was previously evaluated by MRI 07/19/2019 and 08/23/2019 and thought to be stable at that point. Stable patchy lucency over L5 and the sacrum. Mild degenerative change of the hips. IMPRESSION: 1. Mildly distended ascending colon with wall thickening and associated evidence of pneumatosis coli. No free peritoneal air. No evidence of small-bowel obstruction. No evidence of mesenteric thrombosis/occlusion. Findings may be due to mesenteric ischemia on or related to infectious colitis. 2. Progressive destruction of the L3 and L4 vertebral bodies centered over the disc space. This was thought to be stable on previous lumbar spine MRI August and October 2020, although has shown interval progression as infection is possible. 3. Mild wall thickening of the distal esophagus and stomach which may be infectious or inflammatory nature. 4.  Several left renal cysts unchanged. 5.  Aortic Atherosclerosis (ICD10-I70.0). Electronically Signed: By: Marin Olp M.D. On: 11/16/2019 09:20    Anti-infectives: Anti-infectives (From admission, onward)   Start     Dose/Rate Route Frequency Ordered Stop   11/08/19 1400  fluconazole (DIFLUCAN) tablet 100 mg  Status:  Discontinued     100 mg Oral Every 24 hours 11/08/19 1059 11/09/19 1101   11/01/19 2000  ceFEPIme (MAXIPIME) 1 g in  sodium chloride 0.9 % 100 mL IVPB     1 g 200 mL/hr over 30 Minutes Intravenous Every 24 hours 11/01/19 1715     11/01/19 1800  ciprofloxacin (CIPRO) tablet 500 mg  Status:  Discontinued     500 mg Oral Daily-1800 11/01/19 1715 11/07/19 0914       Assessment/Plan HTN DM ESRD on HD HLD Endocarditis of MV Chronic anticoagulation on coumadin - INR 5.8 today Hepatic encephalopathy Chronic anemia Recent CVA currently admitted to inpt rehab for post-stroke rehabilitation  Destruction of the L3 and L4 vertebral bodies - NS following  Ascending colon pneumatosis  - seen on CT 12/24, no free air, she could have underlying Ogilvie's given its dilation on the right and gradual tapering down in the left  - abdominal exam benign, patient tolerating diet and having bowel function - Continue to recommend GI evaluation for possible endoscopic evaluation  ID - currently maxipime 12/9>> FEN - reg diet VTE - coumadin Foley - none Follow up - TBD    LOS: 16 days    [place], [facility] Surgery 11/17/2019, 8:50 AM Please see Amion for pager number during day hours 7:00am-4:30pm

## 2019-11-17 NOTE — Plan of Care (Signed)
  Problem: Consults Goal: RH STROKE PATIENT EDUCATION Description: See Patient Education module for education specifics  Outcome: Progressing Goal: Nutrition Consult-if indicated Outcome: Progressing Goal: Diabetes Guidelines if Diabetic/Glucose > 140 Description: If diabetic or lab glucose is > 140 mg/dl - Initiate Diabetes/Hyperglycemia Guidelines & Document Interventions  Outcome: Progressing   Problem: RH BOWEL ELIMINATION Goal: RH STG MANAGE BOWEL WITH ASSISTANCE Description: STG Manage Bowel with mod I/min Assistance. Outcome: Progressing Goal: RH STG MANAGE BOWEL W/MEDICATION W/ASSISTANCE Description: STG Manage Bowel with Medication with mod I Assistance. Outcome: Progressing   Problem: RH SKIN INTEGRITY Goal: RH STG SKIN FREE OF INFECTION/BREAKDOWN Description: Patients skin will remain free from further infection or breakdown with min assist. Outcome: Progressing Goal: RH STG MAINTAIN SKIN INTEGRITY WITH ASSISTANCE Description: STG Maintain Skin Integrity With min Assistance. Outcome: Progressing   Problem: RH SAFETY Goal: RH STG ADHERE TO SAFETY PRECAUTIONS W/ASSISTANCE/DEVICE Description: STG Adhere to Safety Precautions With supervision/min Assistance/Device. Outcome: Progressing Goal: RH STG DECREASED RISK OF FALL WITH ASSISTANCE Description: STG Decreased Risk of Fall With supervision/min Assistance. Outcome: Progressing   Problem: RH PAIN MANAGEMENT Goal: RH STG PAIN MANAGED AT OR BELOW PT'S PAIN GOAL Description: < 5 Outcome: Progressing   Problem: RH KNOWLEDGE DEFICIT Goal: RH STG INCREASE KNOWLEDGE OF DIABETES Description: Patient/caregiver will verbalize understanding of DM including diet, exercise, medications, monitoring, and follow up care with min assist. Outcome: Progressing Goal: RH STG INCREASE KNOWLEDGE OF HYPERTENSION Description: Patient/caregiver will verbalize understanding of HTN including diet, exercise, medications, monitoring, and follow  up care with min assist. Outcome: Progressing Goal: RH STG INCREASE KNOWLEGDE OF HYPERLIPIDEMIA Description: Patient/caregiver will verbalize understanding of HLD including diet, exercise, medications, monitoring, and follow up care with min assist. Outcome: Progressing Goal: RH STG INCREASE KNOWLEDGE OF STROKE PROPHYLAXIS Description: Patient/caregiver will verbalize understanding of stroke prophylaxis including diet, exercise, medications, monitoring, and follow up care with min assist. Outcome: Progressing

## 2019-11-18 ENCOUNTER — Inpatient Hospital Stay (HOSPITAL_COMMUNITY): Payer: Medicare PPO | Admitting: Occupational Therapy

## 2019-11-18 ENCOUNTER — Encounter (HOSPITAL_COMMUNITY): Payer: Self-pay | Admitting: Physical Medicine & Rehabilitation

## 2019-11-18 ENCOUNTER — Inpatient Hospital Stay (HOSPITAL_COMMUNITY): Payer: Medicare PPO | Admitting: Speech Pathology

## 2019-11-18 ENCOUNTER — Inpatient Hospital Stay (HOSPITAL_COMMUNITY): Payer: Medicare PPO

## 2019-11-18 LAB — CBC
HCT: 24.9 % — ABNORMAL LOW (ref 36.0–46.0)
Hemoglobin: 7.6 g/dL — ABNORMAL LOW (ref 12.0–15.0)
MCH: 28.1 pg (ref 26.0–34.0)
MCHC: 30.5 g/dL (ref 30.0–36.0)
MCV: 92.2 fL (ref 80.0–100.0)
Platelets: 297 10*3/uL (ref 150–400)
RBC: 2.7 MIL/uL — ABNORMAL LOW (ref 3.87–5.11)
RDW: 18.3 % — ABNORMAL HIGH (ref 11.5–15.5)
WBC: 8.7 10*3/uL (ref 4.0–10.5)
nRBC: 0 % (ref 0.0–0.2)

## 2019-11-18 LAB — GLUCOSE, CAPILLARY
Glucose-Capillary: 115 mg/dL — ABNORMAL HIGH (ref 70–99)
Glucose-Capillary: 125 mg/dL — ABNORMAL HIGH (ref 70–99)
Glucose-Capillary: 71 mg/dL (ref 70–99)
Glucose-Capillary: 119 mg/dL — ABNORMAL HIGH (ref 70–99)

## 2019-11-18 LAB — RENAL FUNCTION PANEL
Albumin: 1.7 g/dL — ABNORMAL LOW (ref 3.5–5.0)
Anion gap: 15 (ref 5–15)
BUN: 43 mg/dL — ABNORMAL HIGH (ref 8–23)
CO2: 22 mmol/L (ref 22–32)
Calcium: 8.2 mg/dL — ABNORMAL LOW (ref 8.9–10.3)
Chloride: 97 mmol/L — ABNORMAL LOW (ref 98–111)
Creatinine, Ser: 7.58 mg/dL — ABNORMAL HIGH (ref 0.44–1.00)
GFR calc Af Amer: 6 mL/min — ABNORMAL LOW (ref >60)
GFR calc non Af Amer: 5 mL/min — ABNORMAL LOW (ref >60)
Glucose, Bld: 119 mg/dL — ABNORMAL HIGH (ref 70–99)
Phosphorus: 4.2 mg/dL (ref 2.5–4.6)
Potassium: 3.9 mmol/L (ref 3.5–5.1)
Sodium: 134 mmol/L — ABNORMAL LOW (ref 135–145)

## 2019-11-18 LAB — PROTIME-INR
INR: 2.1 — ABNORMAL HIGH (ref 0.8–1.2)
Prothrombin Time: 23.4 seconds — ABNORMAL HIGH (ref 11.4–15.2)

## 2019-11-18 MED ORDER — SODIUM CHLORIDE 0.9 % IV SOLN: 100.0000 mL | INTRAVENOUS | Status: DC | PRN

## 2019-11-18 MED ORDER — LIDOCAINE HCL (PF) 1 % IJ SOLN
5.0000 mL | INTRAMUSCULAR | Status: DC | PRN
  Filled 2019-11-18 (×2): qty 5

## 2019-11-18 MED ORDER — PENTAFLUOROPROP-TETRAFLUOROETH EX AERO: 1.0000 "application " | INHALATION_SPRAY | CUTANEOUS | Status: DC | PRN

## 2019-11-18 MED ORDER — HEPARIN SODIUM (PORCINE) 1000 UNIT/ML IJ SOLN
INTRAMUSCULAR | Status: AC
  Administered 2019-11-18: 3200 [IU] via INTRAVENOUS_CENTRAL
  Filled 2019-11-18: qty 4

## 2019-11-18 MED ORDER — CIPROFLOXACIN HCL 500 MG PO TABS
500.0000 mg | ORAL_TABLET | ORAL | Status: DC
  Administered 2019-11-18 – 2019-12-04 (×17): 500 mg via ORAL
  Filled 2019-11-18 (×17): qty 1

## 2019-11-18 MED ORDER — ALTEPLASE 2 MG IJ SOLR: 2.0000 mg | Freq: Once | INTRAMUSCULAR | Status: DC | PRN

## 2019-11-18 MED ORDER — LIDOCAINE-PRILOCAINE 2.5-2.5 % EX CREA
1.0000 "application " | TOPICAL_CREAM | CUTANEOUS | Status: DC | PRN
  Filled 2019-11-18: qty 5

## 2019-11-18 MED ORDER — WARFARIN 0.5 MG HALF TABLET
0.5000 mg | ORAL_TABLET | Freq: Once | ORAL | Status: AC
  Administered 2019-11-18: 0.5 mg via ORAL
  Filled 2019-11-18: qty 1

## 2019-11-18 MED ORDER — HEPARIN SODIUM (PORCINE) 1000 UNIT/ML DIALYSIS
1000.0000 [IU] | INTRAMUSCULAR | Status: DC | PRN
  Filled 2019-11-18 (×2): qty 1

## 2019-11-18 NOTE — Progress Notes (Signed)
PROGRESS NOTE    [name]  [id number] DOB: [birth year] DOA: 11/01/2019 PCP: Manon Hilding, MD   Brief Narrative: [name] is a 68 y.o. female with a history of ESRD on HD, mitral valve endocarditis, hepatic encephalopathy, chronic anemia, diabetes mellitus, CVA. Patient is currently admitted to inpatient rehab for post-CVA rehabilitation and was found to have worsening mentation.   Assessment & Plan:   Principal Problem:   Cerebrovascular accident (CVA) of right basal ganglia (HCC) Active Problems:   Slow transit constipation   Acute on chronic anemia   Labile blood pressure   Labile blood glucose   Diabetes mellitus type 2 in nonobese (HCC)   Bacteremia   Endocarditis of mitral valve   Septic embolism (HCC)   Seizure (HCC)   Candidiasis   Encephalopathy, hepatic (HCC)   Hyperammonemia (HCC)   Sleep disturbance   Hemodialysis-associated hypotension   Supratherapeutic INR   Gastroesophageal reflux disease   Decubitus ulcer of sacral region, unstageable ([place])   Ogilvie's syndrome   Encephalopathy Unknown etiology. Thought possibly secondary to delirium. Appears to be improved today but just somnolent possibly secondary to Seroquel overnight. Appears to be resolved. -Continue lactulose  Diabetes mellitus, type 2 Patient is on insulin NPH-regular (70-30) 4 units BID WC as an outpatient. Currently on SSI. Blood sugar somewhat controlled. -Continue SSI for now  Hyperammonemia History of hepatic encephalopathy -Continue lactulose  Abdominal pain Initially with symptoms on 12/23. Abdominal X-ray concerning for volvulus and CT scan significant for evidence of pneumatosis coli. 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 Abdominal pain is improved from early but she still is tender in the RUQ. -General surgery recommendations: GI consult -GI recommendations pending (consulted 12/25)  Discitis No trauma noted. In setting  of bacterial endocarditis. Patient does have a history of sacral decubitus ulcer. Afebrile and normal WBC. Pelvic x-ray not suggestive of fracture. MRI obtained which shows concern for infectious process. Neurosurgery consulted and their concern is discitis; recommending ID re-consult. Patient has been afebrile and WBC is normal today. -ID recommendations: Consider aspiration. Have switched from Cefepime to Ceftazidime  Coagulopathy Secondary to Coumadin. INR appears to have reached plateau . No obvious source of bleeding. Hemoglobin stable. Given 1 mg of vitamin K with reversal of INR down to 2.1. Unsure of etiology but this could possibly be secondary to vitamin K deficiency. 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 Evaluated by neurology with LT EEG. Gabapentin discontinued. No reported recurrences.   Right IJ DVT Acute from hospitalization in November. Started on Coumadin. -Coumadin  Mitral valve endocarditis Seen by cardiothoracic surgery on medical admission. No plan for replacement valve at this acute period but will need follow-up as an outpatient. On Cefepime per infectious disease with last recommended dose on 12/06/2019.  Essential hypertension -Continue Imdur  CVA Recently hospitalized and is now undergoing inpatient rehabilitation. Per stroke team, does not appear aspirin was considered for continuation on discharge. -Continue Coumadin  Pressure injury Medial sacrum, not POA    DVT prophylaxis: Coumadin (currently held secondary to supertherapeutic INR) Code Status:   Code Status: DNR Family Communication: None at bedside   Procedures:   HD  Antimicrobials:  Cefepime  Ceftazidime   Subjective: Back pain improved. Abdominal pain still present but not as painful  Objective: Vitals:   11/17/19 0546 11/17/19 1510 11/17/19 2004 11/18/19 0617  BP: 109/68 107/63 127/78 121/77  Pulse: 81 82  89  84  Resp: 20 14 18 17   Temp: 98.5 F (36.9 C) 98.4 F (36.9 C) 98.8 F (37.1 C) 98.6 F (37 C)  TempSrc: Oral     SpO2: 99% 99% 98% 100%  Weight:      Height:        Intake/Output Summary (Last 24 hours) at 11/18/2019 0954 Last data filed at 11/18/2019 0400 Gross per 24 hour  Intake 220 ml  Output 50 ml  Net 170 ml   Filed Weights   11/14/19 0326 11/15/19 1318 11/15/19 1659  Weight: 59.9 kg 60 kg 59.2 kg    Examination:  General exam: Appears calm and comfortable Respiratory system: Clear to auscultation. Respiratory effort normal. Cardiovascular system: S1 & S2 heard, RRR. No murmurs, rubs, gallops or clicks. Gastrointestinal system: Abdomen is nondistended, soft and moderately tender in RUQ. Normal bowel sounds heard. Central nervous system: Alert and oriented. No focal neurological deficits. Extremities: No edema. No calf tenderness. Some tenderness over shin Skin: No cyanosis. No rashes Psychiatry: Judgement and insight appear normal. Mood & affect appropriate.     Data Reviewed: I have personally reviewed following labs and imaging studies  CBC: Recent Labs  Lab 11/13/19 1134 11/15/19 1036 11/17/19 0859  WBC 12.6* 12.6* 9.1  HGB 9.1* 8.5* 8.4*  HCT 28.4* 26.3* 27.1*  MCV 91.0 88.9 91.2  PLT 199 241 [id number]   Basic Metabolic Panel: Recent Labs  Lab 11/13/19 1248 11/15/19 1036 11/17/19 0859  NA 131* 135 136  K 4.4 3.8 3.9  CL 94* 96* 96*  CO2 22 22 23   GLUCOSE 198* 138* 150*  BUN 57* 35* 30*  CREATININE 8.41* 6.38* 5.67*  CALCIUM 8.3* 8.6* 8.5*  PHOS 5.2*  --   --    GFR: Estimated Creatinine Clearance: 8.8 mL/min (A) (by C-G formula based on SCr of 5.67 mg/dL (H)). Liver Function Tests: Recent Labs  Lab 11/13/19 1248 11/15/19 1036  AST  --  20  ALT  --  57*  ALKPHOS  --  111  BILITOT  --  0.4  PROT  --  6.6  ALBUMIN 1.7* 2.0*   No results for input(s): LIPASE, AMYLASE in the last 168 hours. Recent Labs  Lab 11/14/19 0522  11/15/19 1036  AMMONIA 25 33   Coagulation Profile: Recent Labs  Lab 11/14/19 0522 11/15/19 0718 11/16/19 0558 11/17/19 0504 11/18/19 0717  INR 5.2* 5.6* 5.2* 5.8* 2.1*   Cardiac Enzymes: No results for input(s): CKTOTAL, CKMB, CKMBINDEX, TROPONINI in the last 168 hours. BNP (last 3 results) No results for input(s): PROBNP in the last 8760 hours. HbA1C: No results for input(s): HGBA1C in the last 72 hours. CBG: Recent Labs  Lab 11/17/19 0618 11/17/19 1130 11/17/19 1709 11/17/19 2108 11/18/19 0615  GLUCAP 151* 101* 157* 90 115*   Lipid Profile: No results for input(s): CHOL, HDL, LDLCALC, TRIG, CHOLHDL, LDLDIRECT in the last 72 hours. Thyroid Function Tests: No results for input(s): TSH, T4TOTAL, FREET4, T3FREE, THYROIDAB in the last 72 hours. Anemia Panel: No results for input(s): VITAMINB12, FOLATE, FERRITIN, TIBC, IRON, RETICCTPCT in the last 72 hours. Sepsis Labs: No results for input(s): PROCALCITON, LATICACIDVEN in the last 168 hours.  No results found for this or any previous visit (from the past 240 hour(s)).       Radiology Studies: MR LUMBAR SPINE WO CONTRAST  Result Date: 11/16/2019 CLINICAL DATA:  Abnormal CT showing possible infection EXAM: MRI LUMBAR SPINE WITHOUT CONTRAST TECHNIQUE: Multiplanar, multisequence MR imaging of the lumbar spine  was performed. No intravenous contrast was administered. COMPARISON:  08/23/2019 MRI, CT abdomen 11/16/2019 FINDINGS: Segmentation:  Standard. Alignment:  Stable Vertebrae: Diffusely abnormal marrow signal is again identified with marked heterogeneity. There is progression of abnormal signal particularly at the L3 and L4 levels, noting progression of erosive changes on same day CT. Conus medullaris and cauda equina: Conus extends to the L1-L2 level. Conus and cauda equina appear normal. Paraspinal and other soft tissues: Edema within the left greater than right psoas muscles extending inferiorly from the L3 level.  There is a 1 cm area of T2 hyperintensity extending laterally from the L3-L4 disc level into the psoas muscle. Disc levels: Congenital narrowing of the spinal canal. Stable appearance of L1-L2, L2-L3, L4-L5, and L5-S1. L3-L4: Disc bulge, endplate retropulsion, and facet arthropathy. Similar moderate canal stenosis with narrowing of lateral recesses. Increased effacement of neural foramina. IMPRESSION: Overall abnormal marrow signal is again likely due to renal osteodystrophy. However, progression of abnormal signal and endplate destruction at L3-L4 is suspicious for superimposed infection given new paraspinal edema and possible small collection extending laterally from the L3-L4 disc level into the left psoas muscle. Electronically Signed   By: Macy Mis M.D.   On: 11/16/2019 12:42        Scheduled Meds: . acetaminophen  500 mg Oral TID  . calcium carbonate  1 tablet Oral 2 times per day  . carvedilol  12.5 mg Oral BID  . Chlorhexidine Gluconate Cloth  6 each Topical Q0600  . [START ON 11/20/2019] darbepoetin (ARANESP) injection - DIALYSIS  100 mcg Intravenous Q Mon-HD  . doxercalciferol  1.5 mcg Intravenous Q M,W,F-HD  . feeding supplement (ENSURE ENLIVE)  237 mL Oral BID BM  . insulin aspart  0-9 Units Subcutaneous TID WC  . isosorbide mononitrate  30 mg Oral QPC lunch  . lactulose  20 g Oral TID  . Melatonin  3 mg Oral QHS  . multivitamin  1 tablet Oral QPC lunch  . pantoprazole  40 mg Oral Daily  . Warfarin - Pharmacist Dosing Inpatient   Does not apply q1800   Continuous Infusions: . sodium chloride    . sodium chloride    . cefTAZidime (FORTAZ)  IV Stopped (11/17/19 2051)  . [START ON 11/20/2019] ferric gluconate (FERRLECIT/NULECIT) IV       LOS: 17 days     Cordelia Poche, MD [facility] 11/18/2019, 9:54 AM  If 7PM-7AM, please contact night-coverage [email or web address]

## 2019-11-18 NOTE — Progress Notes (Addendum)
INFECTIOUS DISEASE PROGRESS NOTE  ID: [name] is a 68 y.o. female with  Principal Problem:   Cerebrovascular accident (CVA) of right basal ganglia (HCC) Active Problems:   Slow transit constipation   Acute on chronic anemia   Labile blood pressure   Labile blood glucose   Diabetes mellitus type 2 in nonobese (HCC)   Bacteremia   Endocarditis of mitral valve   Septic embolism (HCC)   Seizure (HCC)   Candidiasis   Encephalopathy, hepatic (HCC)   Hyperammonemia (HCC)   Sleep disturbance   Hemodialysis-associated hypotension   Supratherapeutic INR   Gastroesophageal reflux disease   Decubitus ulcer of sacral region, unstageable ([place])   Ogilvie's syndrome  Subjective: C/o n/v.  Mild back pain.   Abtx:  Anti-infectives (From admission, onward)   Start     Dose/Rate Route Frequency Ordered Stop   11/17/19 2000  cefTAZidime (FORTAZ) 1 g in sodium chloride 0.9 % 100 mL IVPB     1 g 200 mL/hr over 30 Minutes Intravenous Every 24 hours 11/17/19 1452     11/08/19 1400  fluconazole (DIFLUCAN) tablet 100 mg  Status:  Discontinued     100 mg Oral Every 24 hours 11/08/19 1059 11/09/19 1101   11/01/19 2000  ceFEPIme (MAXIPIME) 1 g in sodium chloride 0.9 % 100 mL IVPB  Status:  Discontinued     1 g 200 mL/hr over 30 Minutes Intravenous Every 24 hours 11/01/19 1715 11/17/19 1422   11/01/19 1800  ciprofloxacin (CIPRO) tablet 500 mg  Status:  Discontinued     500 mg Oral Daily-1800 11/01/19 1715 11/07/19 0914      Medications:  Scheduled: . acetaminophen  500 mg Oral TID  . calcium carbonate  1 tablet Oral 2 times per day  . carvedilol  12.5 mg Oral BID  . Chlorhexidine Gluconate Cloth  6 each Topical Q0600  . [START ON 11/20/2019] darbepoetin (ARANESP) injection - DIALYSIS  100 mcg Intravenous Q Mon-HD  . doxercalciferol  1.5 mcg Intravenous Q M,W,F-HD  . feeding supplement (ENSURE ENLIVE)  237 mL Oral BID BM  . insulin aspart  0-9 Units Subcutaneous TID WC  .  isosorbide mononitrate  30 mg Oral QPC lunch  . lactulose  20 g Oral TID  . Melatonin  3 mg Oral QHS  . multivitamin  1 tablet Oral QPC lunch  . pantoprazole  40 mg Oral Daily  . Warfarin - Pharmacist Dosing Inpatient   Does not apply q1800    Objective: Vital signs in last 24 hours: Temp:  [98.4 F (36.9 C)-98.8 F (37.1 C)] 98.6 F (37 C) (12/26 0617) Pulse Rate:  [82-89] 84 (12/26 0617) Resp:  [14-18] 17 (12/26 0617) BP: (107-127)/(63-78) 121/77 (12/26 0617) SpO2:  [98 %-100 %] 100 % (12/26 0617)   General appearance: alert, cooperative and no distress Resp: clear to auscultation bilaterally Cardio: regular rate and rhythm GI: normal findings: bowel sounds normal and soft, non-tender  Lab Results Recent Labs    11/17/19 0859  WBC 9.1  HGB 8.4*  HCT 27.1*  NA 136  K 3.9  CL 96*  CO2 23  BUN 30*  CREATININE 5.67*   Liver Panel No results for input(s): PROT, ALBUMIN, AST, ALT, ALKPHOS, BILITOT, BILIDIR, IBILI in the last 72 hours. Sedimentation Rate No results for input(s): ESRSEDRATE in the last 72 hours. C-Reactive Protein No results for input(s): CRP in the last 72 hours.  Microbiology: No results found for this or any previous visit (  from the past 240 hour(s)).  Studies/Results: No results found.   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  Will add back cipro with multiple comments suggesting she did not seize with this.  Defer IR aspirate of spine unless clinical change.  INR improving. Given VIt K.          Bobby Rumpf MD, FACP Infectious Diseases (pager) [phone] [email or web address] 11/18/2019, 12:04 PM  LOS: 17 days

## 2019-11-18 NOTE — Progress Notes (Addendum)
Physical Therapy Note  Patient Details  Name: [name] MRN: [id number] Date of Birth: 10-01-1951 Today's Date: 11/18/2019    Spoke with RN prior to session about bed rest orders. RN stated that patient was clear for bed level exercise. Patient in bed asleep upon PT arrival. Patient did not arouse to verbal simulation, but did arouse to tactile stimulation. Patient agreeable to performing bed level exercises initially, however, the patient fell back asleep while PT was setting up her bed and required tactile stimulation to arouse again. Patient stated she was very tired and requested that therapist come back later. Encouraged patient to participate, however, patient continued to state she was tired. Positioned patient in L side-lying with 2 pillows for pressure relief due to sacral skin breakdown and pain. Patient missed 60 min of skilled PT due to fatigue/refusal. Will attempt to make up missed time as able.   Talma Aguillard L Gissele Narducci PT, DPT  11/18/2019, 12:49 PM

## 2019-11-18 NOTE — Consult Note (Signed)
Reason for Consult: Abnormal CT scan Referring Physician: Hospital team  [name] is an 68 y.o. female.  HPI: Patient seen and examined in hospital computer chart reviewed and her pain seems to have resolved and she is moving her bowels family history is negative from a GI standpoint and she has not had a previous colonoscopy and currently is in dialysis without any new complaints and previous CT showed a dilated appendix but not a dilated colon from 8 of 2020 Past Medical History:  Diagnosis Date  . Arthritis    hands  . Constipation   . Diabetes mellitus without complication (HCC)    Type II  . ESRD (end stage renal disease) ([place])     M/W/F- Hemodialysis  . GERD (gastroesophageal reflux disease)   . Headache   . Hyperlipidemia   . Hypertension   . Irregular heart rate   . Stroke [facility])    TIA - approx 2010- no residual    Past Surgical History:  Procedure Laterality Date  . ABDOMINAL HYSTERECTOMY    . AORTIC ARCH ANGIOGRAPHY N/A 03/28/2019   Procedure: AORTIC ARCH ANGIOGRAPHY;  Surgeon: Waynetta Sandy, MD;  Location: [place] [facility];  Service: Cardiovascular;  Laterality: N/A;  . AV FISTULA PLACEMENT Left 06/13/2018   Procedure: ARTERIOVENOUS (AV) FISTULA CREATION;  Surgeon: Rosetta Posner, MD;  Location: [place];  Service: Vascular;  Laterality: Left;  . AV FISTULA PLACEMENT Left 08/18/2018   Procedure: INSERTION OF 4-7MM X 45CM ARTERIOVENOUS (AV) GORE-TEX GRAFT LEFT  FOREARM;  Surgeon: Rosetta Posner, MD;  Location: [place];  Service: Vascular;  Laterality: Left;  . AV FISTULA PLACEMENT Right 04/06/2019   Procedure: INSERTION OF ARTERIOVENOUS (AV) GORE-TEX GRAFT RIGHT UPPER ARM;  Surgeon: Waynetta Sandy, MD;  Location: [place];  Service: Vascular;  Laterality: Right;  . AV FISTULA PLACEMENT Right 04/13/2019   Procedure: INSERTION OF ARTERIOVENOUS (AV) BOVINE  ARTEGRAFT GRAFT RIGHT UPPER EXTREMITY;  Surgeon: Serafina Mitchell, MD;  Location: [place];  Service:  Vascular;  Laterality: Right;  . [place] REMOVAL Right 05/16/2019   Procedure: REMOVAL OF ARTERIOVENOUS GORETEX GRAFT ([place]) RIGHT ARM;  Surgeon: Angelia Mould, MD;  Location: [place];  Service: Vascular;  Laterality: Right;  . BASCILIC VEIN TRANSPOSITION Right 03/07/2019   Procedure: First Stage Bascilic Vein Transposition Right Arm;  Surgeon: Serafina Mitchell, MD;  Location: [place];  Service: Vascular;  Laterality: Right;  . CATARACT EXTRACTION Right 2005  . INSERTION OF DIALYSIS CATHETER N/A 08/18/2018   Procedure: INSERTION OF DIALYSIS CATHETER;  Surgeon: Rosetta Posner, MD;  Location: [place];  Service: Vascular;  Laterality: N/A;  . INSERTION OF DIALYSIS CATHETER Right 07/25/2019   Procedure: INSERTION OF DIALYSIS CATHETER RIGHT INTERNAL JUGULAR (TUNNELED);  Surgeon: Virl Cagey, MD;  Location: [facility];  Service: General;  Laterality: Right;  . IR FLUORO GUIDE CV LINE LEFT  10/26/2019  . IR FLUORO GUIDE CV LINE RIGHT  12/06/2018  . IR PTA ADDL CENTRAL DIALYSIS SEG THRU DIALY CIRCUIT RIGHT Right 12/06/2018  . IR REMOVAL TUN CV CATH W/O FL  10/23/2019  . IR US GUIDE VASC ACCESS LEFT  10/26/2019  . TEE WITHOUT CARDIOVERSION N/A 10/26/2019   Procedure: TRANSESOPHAGEAL ECHOCARDIOGRAM (TEE);  Surgeon: Lelon Perla, MD;  Location: [facility];  Service: Cardiovascular;  Laterality: N/A;  . THROMBECTOMY AND REVISION OF ARTERIOVENTOUS (AV) GORETEX  GRAFT Left 09/29/2018   Procedure: INSERTION OF ARTERIOVENTOUS (AV) GORETEX  GRAFT ARM;  Surgeon:  Waynetta Sandy, MD;  Location: [place];  Service: Vascular;  Laterality: Left;  . UPPER EXTREMITY ANGIOGRAPHY Right 03/28/2019   Procedure: UPPER EXTREMITY ANGIOGRAPHY;  Surgeon: Waynetta Sandy, MD;  Location: [place] [facility];  Service: Cardiovascular;  Laterality: Right;  . VENOGRAM  10/27/2018   Procedure: Venogram;  Surgeon: Marty Heck, MD;  Location: [place] [facility];  Service: Cardiovascular;;  bilateral arm     Family History  Problem Relation Age of Onset  . Heart murmur Mother   . Heart failure Mother   . Diabetes Mother   . Cirrhosis Father   . Alcoholism Father     Social History:  reports that she has never smoked. She has never used smokeless tobacco. She reports that she does not drink alcohol or use drugs.  Allergies: No Known Allergies  Medications: I have reviewed the patient's current medications.  Results for orders placed or performed during the hospital encounter of 11/01/19 (from the past 48 hour(s))  Glucose, capillary     Status: Abnormal   Collection Time: 11/16/19  4:54 PM  Result Value Ref Range   Glucose-Capillary 135 (H) 70 - 99 mg/dL  Glucose, capillary     Status: Abnormal   Collection Time: 11/16/19  9:25 PM  Result Value Ref Range   Glucose-Capillary 195 (H) 70 - 99 mg/dL  Protime-INR     Status: Abnormal   Collection Time: 11/17/19  5:04 AM  Result Value Ref Range   Prothrombin Time 52.2 (H) 11.4 - 15.2 seconds   INR 5.8 (HH) 0.8 - 1.2    Comment: REPEATED TO VERIFY CRITICAL RESULT CALLED TO, READ BACK BY AND VERIFIED WITH: K.BROWN,RN 11/17/2019 0548 DAVISB (NOTE) INR goal varies based on device and disease states. Performed at [facility], [place] [address], [place], [place] [address]   Glucose, capillary     Status: Abnormal   Collection Time: 11/17/19  6:18 AM  Result Value Ref Range   Glucose-Capillary 151 (H) 70 - 99 mg/dL  CBC     Status: Abnormal   Collection Time: 11/17/19  8:59 AM  Result Value Ref Range   WBC 9.1 4.0 - 10.5 K/uL   RBC 2.97 (L) 3.87 - 5.11 MIL/uL   Hemoglobin 8.4 (L) 12.0 - 15.0 g/dL   HCT 27.1 (L) 36.0 - 46.0 %   MCV 91.2 80.0 - 100.0 fL   MCH 28.3 26.0 - 34.0 pg   MCHC 31.0 30.0 - 36.0 g/dL   RDW 18.2 (H) 11.5 - 15.5 %   Platelets 274 150 - 400 K/uL   nRBC 0.2 0.0 - 0.2 %    Comment: Performed at [facility], [place] [address], [place], [place] [id number]  Basic metabolic panel     Status: Abnormal    Collection Time: 11/17/19  8:59 AM  Result Value Ref Range   Sodium 136 135 - 145 mmol/L   Potassium 3.9 3.5 - 5.1 mmol/L   Chloride 96 (L) 98 - 111 mmol/L   CO2 23 22 - 32 mmol/L   Glucose, Bld 150 (H) 70 - 99 mg/dL   BUN 30 (H) 8 - 23 mg/dL   Creatinine, Ser 5.67 (H) 0.44 - 1.00 mg/dL   Calcium 8.5 (L) 8.9 - 10.3 mg/dL   GFR calc non Af Amer 7 (L) >60 mL/min   GFR calc Af Amer 8 (L) >60 mL/min   Anion gap 17 (H) 5 - 15    Comment:  Performed at [facility], [place] [address] [place], [place] [address]  APTT     Status: Abnormal   Collection Time: 11/17/19  8:59 AM  Result Value Ref Range   aPTT 65 (H) 24 - 36 seconds    Comment:        IF BASELINE aPTT IS ELEVATED, SUGGEST PATIENT RISK ASSESSMENT BE USED TO DETERMINE APPROPRIATE ANTICOAGULANT THERAPY. Performed at [facility], [place] [address], [place], [place] [address]   Fibrinogen     Status: Abnormal   Collection Time: 11/17/19  8:59 AM  Result Value Ref Range   Fibrinogen 740 (H) 210 - 475 mg/dL    Comment: Performed at [place] [address], [place], [place] [address]  D-dimer, quantitative (not at [facility])     Status: Abnormal   Collection Time: 11/17/19  8:59 AM  Result Value Ref Range   D-Dimer, Quant 4.98 (H) 0.00 - 0.50 ug/mL-FEU    Comment: (NOTE) At the manufacturer cut-off of 0.50 ug/mL FEU, this assay has been documented to exclude PE with a sensitivity and negative predictive value of 97 to 99%.  At this time, this assay has not been approved by the FDA to exclude DVT/VTE. Results should be correlated with clinical presentation. Performed at [facility], [place] [address], [place], [place] [address]   Glucose, capillary     Status: Abnormal   Collection Time: 11/17/19 11:30 AM  Result Value Ref Range   Glucose-Capillary 101 (H) 70 - 99 mg/dL  Glucose, capillary     Status: Abnormal   Collection Time: 11/17/19  5:09 PM  Result Value Ref Range   Glucose-Capillary 157 (H) 70 - 99  mg/dL  Glucose, capillary     Status: None   Collection Time: 11/17/19  9:08 PM  Result Value Ref Range   Glucose-Capillary 90 70 - 99 mg/dL  Glucose, capillary     Status: Abnormal   Collection Time: 11/18/19  6:15 AM  Result Value Ref Range   Glucose-Capillary 115 (H) 70 - 99 mg/dL  Protime-INR     Status: Abnormal   Collection Time: 11/18/19  7:17 AM  Result Value Ref Range   Prothrombin Time 23.4 (H) 11.4 - 15.2 seconds   INR 2.1 (H) 0.8 - 1.2    Comment: (NOTE) INR goal varies based on device and disease states. Performed at [facility], [place] [address] [place], [place] [address]   Glucose, capillary     Status: Abnormal   Collection Time: 11/18/19 11:42 AM  Result Value Ref Range   Glucose-Capillary 125 (H) 70 - 99 mg/dL  CBC     Status: Abnormal   Collection Time: 11/18/19 12:47 PM  Result Value Ref Range   WBC 8.7 4.0 - 10.5 K/uL   RBC 2.70 (L) 3.87 - 5.11 MIL/uL   Hemoglobin 7.6 (L) 12.0 - 15.0 g/dL   HCT 24.9 (L) 36.0 - 46.0 %   MCV 92.2 80.0 - 100.0 fL   MCH 28.1 26.0 - 34.0 pg   MCHC 30.5 30.0 - 36.0 g/dL   RDW 18.3 (H) 11.5 - 15.5 %   Platelets 297 150 - 400 K/uL   nRBC 0.0 0.0 - 0.2 %    Comment: Performed at [facility], [place] [address] [place], [place] [address]  Renal function panel     Status: Abnormal   Collection Time: 11/18/19 12:47 PM  Result Value Ref Range  Sodium 134 (L) 135 - 145 mmol/L   Potassium 3.9 3.5 - 5.1 mmol/L   Chloride 97 (L) 98 - 111 mmol/L   CO2 22 22 - 32 mmol/L   Glucose, Bld 119 (H) 70 - 99 mg/dL   BUN 43 (H) 8 - 23 mg/dL   Creatinine, Ser 7.58 (H) 0.44 - 1.00 mg/dL   Calcium 8.2 (L) 8.9 - 10.3 mg/dL   Phosphorus 4.2 2.5 - 4.6 mg/dL   Albumin 1.7 (L) 3.5 - 5.0 g/dL   GFR calc non Af Amer 5 (L) >60 mL/min   GFR calc Af Amer 6 (L) >60 mL/min   Anion gap 15 5 - 15    Comment: Performed at [facility] [address], [place], [place] [address]    No results found.  Review of Systems negative except  above Blood pressure 121/77, pulse 84, temperature 98.6 F (37 C), resp. rate 17, height 5\' 7"  (1.702 m), weight 59.2 kg, SpO2 100 %. Physical Exam patient lying comfortably in dialysis vital signs stable afebrile abdomen is soft occasional bowel sounds nontender labs and x-rays reviewed white count normal  Assessment/Plan: Abnormal CT scan Plan: Continue present management call my partner Dr. Paulita Fujita tomorrow if any question or problem otherwise we will ask our rounding team to evaluate early next week and see if proceeding with a colonoscopy as an inpatient is needed otherwise avoid constipation and if recurrence of pain would resume clear liquids and consider repeat x-ray to reevaluate  [facility] E 11/18/2019, 1:31 PM

## 2019-11-18 NOTE — Progress Notes (Signed)
Speech Language Pathology Daily Session Note  Patient Details  Name: [name] MRN: [id number] Date of Birth: 04-02-1951  Today's Date: 11/18/2019 SLP Individual Time: 0830-0930 SLP Individual Time Calculation (min): 60 min  Short Term Goals: Week 3: SLP Short Term Goal 1 (Week 3): Pt will demonstrate functional problem solving during basic tasks with Mod A verbal/visual cues SLP Short Term Goal 2 (Week 3): Pt will demonstrate recall of day to day/new information with Mod A verbal cues for use of compensatory memory strategies SLP Short Term Goal 3 (Week 3): Pt will sustain attention to functional tasks with Min A verbal cues for redirection SLP Short Term Goal 4 (Week 3): Pt will detect and correct errors during functional tasks with Mod A verbal/visual cues. SLP Short Term Goal 5 (Week 3): Pt will initiate during functional tasks with Mod A verbal/visual cues from clinician.  Skilled Therapeutic Interventions: Pt was seen for skilled ST targeting cognitive goals. Pt was inepdently oriented X4 and demonstrated improved initiation this morning in comparison to last ST visit. Pt was awake but continued to require Mod A verbal cues to open eyes during interactions and to successfully participate in therapy. She inquired regarding her therapy schedule, and when re-written by SLP in large bold print, pt read schedule to anticipate appointments with Min A verbal and visual cues for organization and tracking. During a novel basic card task (WAR), pt identified the higher of 2 numbers with 100% accuracy with Supervision A verbal cues for accuracy. During structured sustained and selective attention tasks, pt continues to require ~Mod A verbal cues for redirection. During a delayed recall task (5 min delay), pt independently recalled 1/4 words, Max A verbal cues were required for pt to recall others. Recommend continue ST services with a focus on ST memory, functional problem solving, and attention.  Pt left laying in bed with call bell within reach. Continue per current plan of care.        Pain Pain Assessment Pain Scale: Faces Pain Score: 5  Faces Pain Scale: Hurts a little bit Pain Type: Acute pain Pain Location: Sacrum Pain Orientation: Medial Pain Descriptors / Indicators: Aching;Grimacing Pain Frequency: Intermittent Pain Onset: On-going Patients Stated Pain Goal: 1 Pain Intervention(s): Repositioned  Therapy/Group: Individual Therapy  Arbutus Leas 11/18/2019, 9:36 AM

## 2019-11-18 NOTE — Procedures (Signed)
Patient was seen on dialysis and the procedure was supervised.  BFR 400  Via TDC BP is  119/61.   Patient appears to be tolerating treatment well  Louis Meckel 11/18/2019

## 2019-11-18 NOTE — Plan of Care (Signed)
Problem: RH Balance Goal: LTG: Patient will maintain dynamic sitting balance (OT) Description: LTG:  Patient will maintain dynamic sitting balance with assistance during activities of daily living (OT) Flowsheets (Taken 11/18/2019 1522) LTG: Pt will maintain dynamic sitting balance during ADLs with: (downgraded due to lack of progress) Minimal Assistance - Patient > 75% Note: Downgraded due to lack of progress Goal: LTG Patient will maintain dynamic standing with ADLs (OT) Description: LTG:  Patient will maintain dynamic standing balance with assist during activities of daily living (OT)  Flowsheets (Taken 11/18/2019 1522) LTG: Pt will maintain dynamic standing balance during ADLs with: (downgraded due to lack of progress) Moderate Assistance - Patient 50 - 74% Note: Downgraded due to lack of progress and multiple medical issues   Problem: Sit to Stand Goal: LTG:  Patient will perform sit to stand in prep for activites of daily living with assistance level (OT) Description: LTG:  Patient will perform sit to stand in prep for activites of daily living with assistance level (OT) Flowsheets (Taken 11/18/2019 1522) LTG: PT will perform sit to stand in prep for activites of daily living with assistance level: (Downgraded due to lack of progress) Moderate Assistance - Patient 50 - 74% Note: Downgraded due to lack of progress and multiple medical issues   Problem: RH Grooming Goal: LTG Patient will perform grooming w/assist,cues/equip (OT) Description: LTG: Patient will perform grooming with assist, with/without cues using equipment (OT) Flowsheets (Taken 11/18/2019 1522) LTG: Pt will perform grooming with assistance level of: (Downgraded due to lack of progress and multiple medical issues) Minimal Assistance - Patient > 75% Note: Downgraded due to lack of progress and multiple medical issues   Problem: RH Bathing Goal: LTG Patient will bathe all body parts with assist levels (OT) Description:  LTG: Patient will bathe all body parts with assist levels (OT) Flowsheets (Taken 11/18/2019 1522) LTG: Pt will perform bathing with assistance level/cueing: (Downgraded due to lack of progress and multiple medical issues) Minimal Assistance - Patient > 75% Note: Downgraded due to lack of progress and multiple medical issues   Problem: RH Dressing Goal: LTG Patient will perform upper body dressing (OT) Description: LTG Patient will perform upper body dressing with assist, with/without cues (OT). Flowsheets (Taken 11/18/2019 1522) LTG: Pt will perform upper body dressing with assistance level of: (Downgraded due to lack of progress and multiple medical issues) Moderate Assistance - Patient 50 - 74% Note: Downgraded due to lack of progress and multiple medical issues Goal: LTG Patient will perform lower body dressing w/assist (OT) Description: LTG: Patient will perform lower body dressing with assist, with/without cues in positioning using equipment (OT) Flowsheets (Taken 11/18/2019 1522) LTG: Pt will perform lower body dressing with assistance level of: (Downgraded due to lack of progress and multiple medical issues) Moderate Assistance - Patient 50 - 74% Note: Downgraded due to lack of progress and multiple medical issues   Problem: RH Toileting Goal: LTG Patient will perform toileting task (3/3 steps) with assistance level (OT) Description: LTG: Patient will perform toileting task (3/3 steps) with assistance level (OT)  Flowsheets (Taken 11/18/2019 1522) LTG: Pt will perform toileting task (3/3 steps) with assistance level: (Downgraded due to lack of progress and multiple medical issues) Moderate Assistance - Patient 50 - 74% Note: Downgraded due to lack of progress and multiple medical issues   Problem: RH Toilet Transfers Goal: LTG Patient will perform toilet transfers w/assist (OT) Description: LTG: Patient will perform toilet transfers with assist, with/without cues using equipment  (OT) Flowsheets (  Taken 11/18/2019 1522) LTG: Pt will perform toilet transfers with assistance level of: (Downgraded due to lack of progress and multiple medical issues) Moderate Assistance - Patient 50 - 74% Note: Downgraded due to lack of progress and multiple medical issues   Problem: RH Tub/Shower Transfers Goal: LTG Patient will perform tub/shower transfers w/assist (OT) Description: LTG: Patient will perform tub/shower transfers with assist, with/without cues using equipment (OT) Flowsheets (Taken 11/18/2019 1522) LTG: Pt will perform tub/shower stall transfers with assistance level of: (Downgraded due to lack of progress and multiple medical issues) Moderate Assistance - Patient 50 - 74% Note: Downgraded due to lack of progress and multiple medical issues

## 2019-11-18 NOTE — Progress Notes (Signed)
[facility] PHYSICAL MEDICINE & REHABILITATION PROGRESS NOTE   Subjective/Complaints: Patient seen lying in bed this morning.  She states she slept well overnight.   Appreciate surgery follow-up this morning; GI to see for possible endoscopic eval.   ROS: Denies CP, SOB, N/V/D  Objective:   MR LUMBAR SPINE WO CONTRAST  Result Date: 11/16/2019 CLINICAL DATA:  Abnormal CT showing possible infection EXAM: MRI LUMBAR SPINE WITHOUT CONTRAST TECHNIQUE: Multiplanar, multisequence MR imaging of the lumbar spine was performed. No intravenous contrast was administered. COMPARISON:  08/23/2019 MRI, CT abdomen 11/16/2019 FINDINGS: Segmentation:  Standard. Alignment:  Stable Vertebrae: Diffusely abnormal marrow signal is again identified with marked heterogeneity. There is progression of abnormal signal particularly at the L3 and L4 levels, noting progression of erosive changes on same day CT. Conus medullaris and cauda equina: Conus extends to the L1-L2 level. Conus and cauda equina appear normal. Paraspinal and other soft tissues: Edema within the left greater than right psoas muscles extending inferiorly from the L3 level. There is a 1 cm area of T2 hyperintensity extending laterally from the L3-L4 disc level into the psoas muscle. Disc levels: Congenital narrowing of the spinal canal. Stable appearance of L1-L2, L2-L3, L4-L5, and L5-S1. L3-L4: Disc bulge, endplate retropulsion, and facet arthropathy. Similar moderate canal stenosis with narrowing of lateral recesses. Increased effacement of neural foramina. IMPRESSION: Overall abnormal marrow signal is again likely due to renal osteodystrophy. However, progression of abnormal signal and endplate destruction at L3-L4 is suspicious for superimposed infection given new paraspinal edema and possible small collection extending laterally from the L3-L4 disc level into the left psoas muscle. Electronically Signed   By: Macy Mis M.D.   On: 11/16/2019 12:42    Recent Labs    11/17/19 0859  WBC 9.1  HGB 8.4*  HCT 27.1*  PLT 274   Recent Labs    11/17/19 0859  NA 136  K 3.9  CL 96*  CO2 23  GLUCOSE 150*  BUN 30*  CREATININE 5.67*  CALCIUM 8.5*    Intake/Output Summary (Last 24 hours) at 11/18/2019 1107 Last data filed at 11/18/2019 0400 Gross per 24 hour  Intake 220 ml  Output 50 ml  Net 170 ml     Physical Exam: Vital Signs Blood pressure 121/77, pulse 84, temperature 98.6 F (37 C), resp. rate 17, height 5\' 7"  (1.702 m), weight 59.2 kg, SpO2 100 %.  Constitutional: No distress . Vital signs reviewed. HENT: Normocephalic.  Atraumatic. Eyes: EOMI. No discharge. Cardiovascular: No JVD. Respiratory: Normal effort.  No stridor. GI: Non-distended. Skin: Sacral ulcer not examined today Psych: Flat.   Musc: No edema in extremities.  No tenderness in extremities. Neurological: Alert and oriented x2 Motor: RUE: 5/5 proximal to distal RLE: HF, KE 3+/5, ADF 4/5, stable LUE: 4+/5 proximal to distal LLE: HF, KE 3/5, ADF 3+/5, stable  Assessment/Plan: 1. Functional deficits secondary to right basal ganglia stroke which require 3+ hours per day of interdisciplinary therapy in a comprehensive inpatient rehab setting.  Physiatrist is providing close team supervision and 24 hour management of active medical problems listed below.  Physiatrist and rehab team continue to assess barriers to discharge/monitor patient progress toward functional and medical goals  Care Tool:  Bathing    Body parts bathed by patient: Right arm, Left arm, Chest, Abdomen, Front perineal area, Right upper leg, Left upper leg, Face   Body parts bathed by helper: Front perineal area, Buttocks, Right upper leg, Left upper leg, Right lower leg, Left  lower leg, Face     Bathing assist Assist Level: Total Assistance - Patient < 25%     Upper Body Dressing/Undressing Upper body dressing   What is the patient wearing?: Pull over shirt    Upper body  assist Assist Level: Minimal Assistance - Patient > 75%    Lower Body Dressing/Undressing Lower body dressing      What is the patient wearing?: Pants     Lower body assist Assist for lower body dressing: Moderate Assistance - Patient 50 - 74%     Toileting Toileting    Toileting assist Assist for toileting: Maximal Assistance - Patient 25 - 49%     Transfers Chair/bed transfer  Transfers assist     Chair/bed transfer assist level: Dependent - mechanical lift     Locomotion Ambulation   Ambulation assist   Ambulation activity did not occur: Safety/medical concerns(back pain, bilateral LE weakness, poor bilateral knee control)  Assist level: 2 helpers Assistive device: Walker-rolling Max distance: 54ft   Walk 10 feet activity   Assist  Walk 10 feet activity did not occur: Safety/medical concerns(back pain, bilateral LE weakness, poor bilateral knee control)  Assist level: 2 helpers Assistive device: Walker-rolling   Walk 50 feet activity   Assist Walk 50 feet with 2 turns activity did not occur: Safety/medical concerns(back pain, bilateral LE weakness, poor bilateral knee control)  Assist level: 2 helpers Assistive device: Walker-rolling    Walk 150 feet activity   Assist Walk 150 feet activity did not occur: Safety/medical concerns(back pain, bilateral LE weakness, poor bilateral knee control)         Walk 10 feet on uneven surface  activity   Assist Walk 10 feet on uneven surfaces activity did not occur: Safety/medical concerns(back pain, bilateral LE weakness, poor bilateral knee control)         Wheelchair     Assist Will patient use wheelchair at discharge?: Yes Type of Wheelchair: Manual Wheelchair activity did not occur: Safety/medical concerns(Pt could not tolerate sitting in WC for long enough to complete activity due to low back pain)  Wheelchair assist level: Minimal Assistance - Patient > 75% Max wheelchair distance:  71ft    Wheelchair 50 feet with 2 turns activity    Assist    Wheelchair 50 feet with 2 turns activity did not occur: Safety/medical concerns(Pt could not tolerate sitting in [facility] for long enough to complete activity due to low back pain)       Wheelchair 150 feet activity     Assist  Wheelchair 150 feet activity did not occur: Safety/medical concerns(Pt could not tolerate sitting in [facility] for long enough to complete activity due to low back pain)       Blood pressure 121/77, pulse 84, temperature 98.6 F (37 C), resp. rate 17, height 5\' 7"  (1.702 m), weight 59.2 kg, SpO2 100 %.    Medical Problem List and Plan: 1.  Impaired gait with left side weakness secondary to right basal ganglia infarction as well as small cortical infarct left frontal lobe, now with delirium  Continue bedrest  CT head on 12/13 personally reviewed, showing some improvement, MRI personally reviewed, stable. Repeat MRI ordered again by ID, showing stable infarcts  Chest x-ray personally reviewed, unremarkable for infection  Discussed with neurology results of the EEG -findings possibly related to uremia +/- cefepime  Discussed with psychiatry, continue medical management  Discussed with hospitalist, appreciate recs, discussed with hospitalist again on 12/25  Plan for palliative care consult  next week 2.  Antithrombotics: -DVT/anticoagulation: Chronic Coumadin for right IJ acute DVT.  Plan anticoagulation x3 months  INR critical value of 5.8 and trending up.  Discussed with pharmacy again-we will plan for 0.5mg  of vitamin K.  12/26: INR 2.1             -antiplatelet therapy: N/A 3. Pain Management chronic back and left hip pain: Neurontin 100 mg on hold  TLSO back brace for comfort 4. Mood: Provide emotional support             -antipsychotic agents: N/A 5. Neuropsych: This patient is not capable of making decisions on her own behalf. 6. Skin/Wound Care: Routine skin checks  Santyl to sacral wound 7.  Fluids/Electrolytes/Nutrition: Routine in and outs 8.  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  New HD tunneled catheter placed 10/26/2019. 9.  ID/discitis/recurrent Pseudomonas bacteremia.    Continue Maxipime as directed with infectious disease to discuss duration of antibiotic therapies anticipate 6 weeks.  Cipro DC'd by ID due to seizure risk  Seen by neurosurgery on 12/25, no surgical intervention recommended at this point, however recommended reconsulting ID  Per ID, no changes in medications, of note, pt did NOT seizures 10.  Hemodialysis MWF.  Follow-up per renal services.  11.  Diabetes mellitus.  Hemoglobin A1c 7.6.  SSI. CBG (last 3)  Recent Labs    11/17/19 1709 11/17/19 2108 11/18/19 0615  GLUCAP 157* 90 115*   Relatively controlled on 12/24, 12/25 12.  Hypertension.  Imdur 30 mg daily  Coreg 6.25 mg twice daily, increased to 12.5 on 12/15, parameters placed.    Monitor with increased mobility.  Relatively controlled on 12/24, 12/25 13.  Acute on chronic anemia.    Hemoglobin 8.4 on 12/25  Continue to monitor 14. Constipation:  See #16  Abd xray showing?  Volvulus  CT abdomen/pelvis showing possible Ogilvie's 15.  Candidiasis  Diflucan ordered on 12/16  Repeat Ucx not collected yet on 12/25, 12/26 16.  Hyperammonemia with hepatic encephalopathy  Lactulose started on 12/17, encourage compliance  Ammonia WNL on 12/22 17. Sleep disturance  Melatonin added on 12/17  Sleep chart ordered  Improving 18.  Leukocytosis  WBCs 12.6 on 12/21, labs with HD  Afebrile  Continue to monitor 19.  GERD  Home Protonix ordered on 12/20 20.  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  Appreciate surgery and GI following  12/26: GI planning possible endoscopy   LOS: 17 days A FACE TO FACE EVALUATION WAS PERFORMED  Krystyn Picking P Humberto Addo 11/18/2019, 11:07 AM

## 2019-11-18 NOTE — Progress Notes (Addendum)
[facility] Progress Note   Subjective:  Seen in room. No specific complaints today other than gas. Doesn't know her outpatient HD center. For dialysis today. Just wants to get dialysis over with   Objective Vitals:   11/17/19 0546 11/17/19 1510 11/17/19 2004 11/18/19 0617  BP: 109/68 107/63 127/78 121/77  Pulse: 81 82 89 84  Resp: 20 14 18 17   Temp: 98.5 F (36.9 C) 98.4 F (36.9 C) 98.8 F (37.1 C) 98.6 F (37 C)  TempSrc: Oral     SpO2: 99% 99% 98% 100%  Weight:      Height:        Physical Exam General: Chronically ill appearing female, nad  Heart: RRR  Lungs: clear, bilaterally  Abdomen: soft nontender  Extremities: No LE edema  Dialysis Access: R IJ TDC in place    Weight change:    Additional Objective Labs: Basic Metabolic Panel: Recent Labs  Lab 11/13/19 1248 11/15/19 1036 11/17/19 0859  NA 131* 135 136  K 4.4 3.8 3.9  CL 94* 96* 96*  CO2 22 22 23   GLUCOSE 198* 138* 150*  BUN 57* 35* 30*  CREATININE 8.41* 6.38* 5.67*  CALCIUM 8.3* 8.6* 8.5*  PHOS 5.2*  --   --    CBC: Recent Labs  Lab 11/13/19 1134 11/15/19 1036 11/17/19 0859  WBC 12.6* 12.6* 9.1  HGB 9.1* 8.5* 8.4*  HCT 28.4* 26.3* 27.1*  MCV 91.0 88.9 91.2  PLT 199 241 274   Blood Culture    Component Value Date/Time   SDES URINE, RANDOM 11/06/2019 1107   SPECREQUEST  11/06/2019 1107    NONE Performed at [facility], [place] [address], [place], [place] [address]    CULT 80,000 COLONIES/mL YEAST (A) 11/06/2019 1107   REPTSTATUS 11/07/2019 FINAL 11/06/2019 1107     Medications: . sodium chloride    . sodium chloride    . cefTAZidime (FORTAZ)  IV Stopped (11/17/19 2051)  . [START ON 11/20/2019] ferric gluconate (FERRLECIT/NULECIT) IV     . acetaminophen  500 mg Oral TID  . calcium carbonate  1 tablet Oral 2 times per day  . carvedilol  12.5 mg Oral BID  . Chlorhexidine Gluconate Cloth  6 each Topical Q0600  . [START ON 11/20/2019] darbepoetin (ARANESP)  injection - DIALYSIS  100 mcg Intravenous Q Mon-HD  . doxercalciferol  1.5 mcg Intravenous Q M,W,F-HD  . feeding supplement (ENSURE ENLIVE)  237 mL Oral BID BM  . insulin aspart  0-9 Units Subcutaneous TID WC  . isosorbide mononitrate  30 mg Oral QPC lunch  . lactulose  20 g Oral TID  . Melatonin  3 mg Oral QHS  . multivitamin  1 tablet Oral QPC lunch  . pantoprazole  40 mg Oral Daily  . Warfarin - Pharmacist Dosing Inpatient   Does not apply q1800    Dialysis Orders:  [facility] MWF3.5 hours TDC - mult failed accesses Profile 1 - pressure drops very easily;max is pull 1.1 an hour; do not pull rinseback revaclear 300 dialyzer 400/600 2 K 2.5 Ca EDW 62.5  Epo 1200 U q HD; iron 50 mg/weekly 1.5 mcg hectoral each tx  Assessment/Plan: 1. Recurrent Pseudomonas bacteremia with evidence of mitral valve endocarditisthat is post [facility] exchange for infection focus control. Seen by CTA w/no  plan for surgical intervention. Completed 26 days cefepime. Antibiotics changed to [facility] per ID 12/25.  2. Progression L3-4 discitis on MRI. Evaluated by neurosurgery. No indication for surgical  intervention. ID reconsulted/following. Antibiotics changed to [facility] as above.  May consider aspiration if INR allows.  2. ESRD: MWF -next HD Saturday (today)  due to holiday schedule.   hold heparin due to high INR unless catheter flow issues 3. Anemia:hgb  9.1 > 8.4 continue ESA^ next dose to 100--tsat 16% 12/15 - Fe held due to being treated for infection 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  5. 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 Ongoing inpatient rehabilitation for management of neurological deficits. Repeat MRI showedno acute changes 6. Hypertension/volume : net UF 500 ml 12/23 post wt 59.2- keep SBP > 110 7.Nutrition -diet liberalized to regular - has supplements/multivits  ordered - alb very low - not eating much - contributing to weakness 8. Ascending colon pneumatosis - seen on CT 12/24. No surgical intervention. GI consult for possible endoscopic evaluation.  9. Deconditioning -variable levels of functioning appear related to mental status    [name] PA-C [place] Pager [phone] 11/18/2019,11:48 AM  LOS: 17 days    Patient seen and examined, agree with above note with above modifications. Kind of alert today, looking forward to just getting dialysis done today  [name], MD 11/18/2019

## 2019-11-18 NOTE — Progress Notes (Signed)
Occupational Therapy Weekly Progress Note  Patient Details  Name: [name] MRN: [id number] Date of Birth: 1951/09/10  Beginning of progress report period: November 10, 2019 End of progress report period: November 18, 2019   Patient has met 0 of 4 short term goals.  Pt has made no progress towards goals due to bedrest orders limiting ability to progress pt as well as ongoing medical issues.  Pt has been on bedrest with bedlevel therapies only due to elevated INR.  Hospitalist involved in patient care to continue to assess due to ongoing medical issues.  Due to back pain, pt with minimal participation in rolling and self-care tasks at bed level.  Pt also with fluctuating levels of confusion, limiting participation and progression in therapy sessions.    Patient continues to demonstrate the following deficits: muscle weakness,decreased cardiorespiratoy enduranceand decreased sitting balance, decreased standing balance and decreased balance strategies and therefore will continue to benefit from skilled OT intervention to enhance overall performance with BADL and Reduce care partner burden.  Patient not progressing toward long term goals.  See goal revision..  Plan of care revisions: Downgraded to Mod assist for transfers and self-care tasks.  OT Short Term Goals Week 2:  OT Short Term Goal 1 (Week 2): Pt will perform shower transfer with mod A. OT Short Term Goal 1 - Progress (Week 2): Not met OT Short Term Goal 2 (Week 2): Pt will perform toileting with mod A. OT Short Term Goal 2 - Progress (Week 2): Not met OT Short Term Goal 3 (Week 2): Pt will complete LB dressing with mod assist OT Short Term Goal 3 - Progress (Week 2): Not met OT Short Term Goal 4 (Week 2): Pt will tolerate sitting OOB 2 hrs to demonstrate increased activity tolerance OT Short Term Goal 4 - Progress (Week 2): Not met Week 3:  OT Short Term Goal 1 (Week 3): Pt will perform shower transfer with mod A. OT Short  Term Goal 2 (Week 3): Pt will perform toileting with mod A. OT Short Term Goal 3 (Week 3): Pt will complete LB dressing with mod assist OT Short Term Goal 4 (Week 3): Pt will tolerate sitting OOB 2 hrs to demonstrate increased activity tolerance    [name], [facility] 11/18/2019, 9:41 AM

## 2019-11-18 NOTE — Progress Notes (Signed)
[facility] Progress Note     Subjective: CC-  States that her abdominal pain is about the same. Majority of pain is lower but she also reports some diffuse abdominal pain. Denies any bloating or nausea this AM. BM x2 yesterday. States that she is tolerating a diet but is not eating very much.   Objective: Vital signs in last 24 hours: Temp:  [98.4 F (36.9 C)-98.8 F (37.1 C)] 98.6 F (37 C) (12/26 0617) Pulse Rate:  [82-89] 84 (12/26 0617) Resp:  [14-18] 17 (12/26 0617) BP: (107-127)/(63-78) 121/77 (12/26 0617) SpO2:  [98 %-100 %] 100 % (12/26 0617) Last BM Date: 11/17/19  Intake/Output from previous day: 12/25 0701 - 12/26 0700 In: 220 [P.O.:120; IV Piggyback:100] Out: 50 [Emesis/NG output:50] Intake/Output this shift: No intake/output data recorded.  PE: Gen:  Alert but sleepy, NAD, pleasant HEENT: EOM's intact, pupils equal and round Card:  RRR Pulm:  CTAB, no W/R/R, effort normal Abd: Soft, mild distension, mild diffuse subjective TTP without rebound or guarding, +BS, no HSM, no hernia Skin: no rashes noted, warm and dry   Lab Results:  Recent Labs    11/15/19 1036 11/17/19 0859  WBC 12.6* 9.1  HGB 8.5* 8.4*  HCT 26.3* 27.1*  PLT 241 274   BMET Recent Labs    11/15/19 1036 11/17/19 0859  NA 135 136  K 3.8 3.9  CL 96* 96*  CO2 22 23  GLUCOSE 138* 150*  BUN 35* 30*  CREATININE 6.38* 5.67*  CALCIUM 8.6* 8.5*   PT/INR Recent Labs    11/17/19 0504 11/18/19 0717  LABPROT 52.2* 23.4*  INR 5.8* 2.1*   CMP     Component Value Date/Time   NA 136 11/17/2019 0859   K 3.9 11/17/2019 0859   CL 96 (L) 11/17/2019 0859   CO2 23 11/17/2019 0859   GLUCOSE 150 (H) 11/17/2019 0859   BUN 30 (H) 11/17/2019 0859   CREATININE 5.67 (H) 11/17/2019 0859   CALCIUM 8.5 (L) 11/17/2019 0859   PROT 6.6 11/15/2019 1036   ALBUMIN 2.0 (L) 11/15/2019 1036   AST 20 11/15/2019 1036   ALT 57 (H) 11/15/2019 1036   ALKPHOS 111 11/15/2019 1036   BILITOT 0.4  11/15/2019 1036   GFRNONAA 7 (L) 11/17/2019 0859   GFRAA 8 (L) 11/17/2019 0859   Lipase     Component Value Date/Time   LIPASE 41 09/10/2019 0015       Studies/Results: MR LUMBAR SPINE WO CONTRAST  Result Date: 11/16/2019 CLINICAL DATA:  Abnormal CT showing possible infection EXAM: MRI LUMBAR SPINE WITHOUT CONTRAST TECHNIQUE: Multiplanar, multisequence MR imaging of the lumbar spine was performed. No intravenous contrast was administered. COMPARISON:  08/23/2019 MRI, CT abdomen 11/16/2019 FINDINGS: Segmentation:  Standard. Alignment:  Stable Vertebrae: Diffusely abnormal marrow signal is again identified with marked heterogeneity. There is progression of abnormal signal particularly at the L3 and L4 levels, noting progression of erosive changes on same day CT. Conus medullaris and cauda equina: Conus extends to the L1-L2 level. Conus and cauda equina appear normal. Paraspinal and other soft tissues: Edema within the left greater than right psoas muscles extending inferiorly from the L3 level. There is a 1 cm area of T2 hyperintensity extending laterally from the L3-L4 disc level into the psoas muscle. Disc levels: Congenital narrowing of the spinal canal. Stable appearance of L1-L2, L2-L3, L4-L5, and L5-S1. L3-L4: Disc bulge, endplate retropulsion, and facet arthropathy. Similar moderate canal stenosis with narrowing of lateral recesses. Increased effacement of  neural foramina. IMPRESSION: Overall abnormal marrow signal is again likely due to renal osteodystrophy. However, progression of abnormal signal and endplate destruction at L3-L4 is suspicious for superimposed infection given new paraspinal edema and possible small collection extending laterally from the L3-L4 disc level into the left psoas muscle. Electronically Signed   By: Macy Mis M.D.   On: 11/16/2019 12:42    Anti-infectives: Anti-infectives (From admission, onward)   Start     Dose/Rate Route Frequency Ordered Stop    11/17/19 2000  cefTAZidime (FORTAZ) 1 g in sodium chloride 0.9 % 100 mL IVPB     1 g 200 mL/hr over 30 Minutes Intravenous Every 24 hours 11/17/19 1452     11/08/19 1400  fluconazole (DIFLUCAN) tablet 100 mg  Status:  Discontinued     100 mg Oral Every 24 hours 11/08/19 1059 11/09/19 1101   11/01/19 2000  ceFEPIme (MAXIPIME) 1 g in sodium chloride 0.9 % 100 mL IVPB  Status:  Discontinued     1 g 200 mL/hr over 30 Minutes Intravenous Every 24 hours 11/01/19 1715 11/17/19 1422   11/01/19 1800  ciprofloxacin (CIPRO) tablet 500 mg  Status:  Discontinued     500 mg Oral Daily-1800 11/01/19 1715 11/07/19 0914       Assessment/Plan HTN DM ESRD on HD HLD Endocarditis of MV Chronic anticoagulation on coumadin - INR 2.1 today Hepatic encephalopathy Chronic anemia Recent CVA currently admitted to inpt rehab for post-stroke rehabilitation  Destruction of the L3 and L4 vertebral bodies - NS following  Ascending colon pneumatosis  - seen on CT 12/24, no free air, she could have underlying Ogilvie's given its dilation on the right and gradual tapering down in the left  - abdomen soft with only mild tenderness, she is tolerating small amount PO and having bowel function. Stable with no role for acute surgical intervention - GI consult for possible endoscopic evaluation  ID - currently maxipime 12/9>> FEN - reg diet VTE - coumadin Foley - none Follow up - TBD   LOS: 17 days    Wellington Hampshire, [facility] 11/18/2019, 8:55 AM Please see Amion for pager number during day hours 7:00am-4:30pm

## 2019-11-18 NOTE — Progress Notes (Signed)
Occupational Therapy Note  Patient Details  Name: [name] MRN: [id number] Date of Birth: [birth year]  Today's Date: 11/18/2019 OT Missed Time: 72 Minutes Missed Time Reason: Other (comment)(HD)  Pt off unit for HD.  Will continue to follow per POC.   [name] 11/18/2019, 1:12 PM

## 2019-11-18 NOTE — Progress Notes (Signed)
ANTICOAGULATION CONSULT NOTE - Follow-Up  Pharmacy Consult for Warfarin Indication: RIJ DVT (10/21/19), CVA (10/20/19)  No Known Allergies  Patient Measurements: Height: 5\' 7"  (170.2 cm) Weight: 130 lb 8.2 oz (59.2 kg) IBW/kg (Calculated) : 61.6  Vital Signs: Temp: 98.6 F (37 C) (12/26 0617) BP: 121/77 (12/26 0617) Pulse Rate: 84 (12/26 0617)  Labs: Recent Labs    11/16/19 0558 11/17/19 0504 11/17/19 0859 11/18/19 0717  HGB  --   --  8.4*  --   HCT  --   --  27.1*  --   PLT  --   --  274  --   APTT  --   --  65*  --   LABPROT 47.6* 52.2*  --  23.4*  INR 5.2* 5.8*  --  2.1*  CREATININE  --   --  5.67*  --     Estimated Creatinine Clearance: 8.8 mL/min (A) (by C-G formula based on SCr of 5.67 mg/dL (H)).  Assessment: 68 yr old female with ESRD who presented on 10/20/19 with new CVA and RIJ DVT 10/21/19. Pharmacy consulted to dose warfarin.   INR continues to remains SUPRAtherapeutic at 5.8 and increased from 5.2 yesterday. Warfarin has been held since 12/19. On 12/23 discussed with MD, no active bleeding, high clotting risk and low bleeding risk overall.  Given her clot much outweighs bleed risk, would prefer to not give vitamin K if possible. Patient has poor appetite but is drinking some Ensure, which has small amounts (4mcg) of vitamin K. Discussed that the high INR is likely due to vitamin K deficiency from poor PO intake.  Today (12/25) given rise in INR spoke with MD who would like to give the lowest dose possible of vitamin K. No current bleeding per RN. CBC stable.   Goal of Therapy:  INR 2-3 Monitor platelets by anticoagulation protocol: Yes   Plan:  - After Vitamin K given pt INR is 2.1  - Will give Warfarin 0.5mg  tonight  - Also resuming Cipro on this patient per ID - Restart Ciprofloxacin 500 mg PO daily   - Daily PT/INR, CBC Q Mon - Monitor for signs/symptoms of bleeding  Duanne Limerick, PharmD. BCPS  ED Clinical Pharmacist

## 2019-11-18 NOTE — Progress Notes (Signed)
Physical Therapy Note  Patient Details  Name: [name] MRN: [id number] Date of Birth: [birth year] Today's Date: 11/18/2019    Attempted to make up missed PT time from this morning, however, patient is off the unit for HD this afternoon per RN.    [name] L Ronnett Pullin PT, DPT  11/18/2019, 1:58 PM

## 2019-11-19 LAB — CBC
HCT: 23.1 % — ABNORMAL LOW (ref 36.0–46.0)
Hemoglobin: 8.1 g/dL — ABNORMAL LOW (ref 12.0–15.0)
MCH: 31.4 pg (ref 26.0–34.0)
MCHC: 35.1 g/dL (ref 30.0–36.0)
MCV: 89.5 fL (ref 80.0–100.0)
Platelets: 543 10*3/uL — ABNORMAL HIGH (ref 150–400)
RBC: 2.58 MIL/uL — ABNORMAL LOW (ref 3.87–5.11)
RDW: 18.9 % — ABNORMAL HIGH (ref 11.5–15.5)
WBC: 10.9 10*3/uL — ABNORMAL HIGH (ref 4.0–10.5)
nRBC: 0 % (ref 0.0–0.2)

## 2019-11-19 LAB — GLUCOSE, CAPILLARY
Glucose-Capillary: 103 mg/dL — ABNORMAL HIGH (ref 70–99)
Glucose-Capillary: 114 mg/dL — ABNORMAL HIGH (ref 70–99)
Glucose-Capillary: 147 mg/dL — ABNORMAL HIGH (ref 70–99)
Glucose-Capillary: 84 mg/dL (ref 70–99)

## 2019-11-19 LAB — PROTIME-INR
INR: 1.5 — ABNORMAL HIGH (ref 0.8–1.2)
Prothrombin Time: 18.4 seconds — ABNORMAL HIGH (ref 11.4–15.2)

## 2019-11-19 LAB — HEPARIN LEVEL (UNFRACTIONATED): Heparin Unfractionated: 1.03 IU/mL — ABNORMAL HIGH (ref 0.30–0.70)

## 2019-11-19 MED ORDER — HEPARIN BOLUS VIA INFUSION
3000.0000 [IU] | Freq: Once | INTRAVENOUS | Status: AC
  Administered 2019-11-19: 3000 [IU] via INTRAVENOUS
  Filled 2019-11-19: qty 3000

## 2019-11-19 MED ORDER — WARFARIN SODIUM 1 MG PO TABS
1.0000 mg | ORAL_TABLET | Freq: Once | ORAL | Status: AC
  Administered 2019-11-19: 17:00:00 1 mg via ORAL
  Filled 2019-11-19: qty 1

## 2019-11-19 MED ORDER — LIDOCAINE 5 % EX PTCH
1.0000 | MEDICATED_PATCH | CUTANEOUS | Status: DC
  Administered 2019-11-19 – 2019-12-03 (×10): 1 via TRANSDERMAL
  Filled 2019-11-19 (×10): qty 1

## 2019-11-19 MED ORDER — HEPARIN (PORCINE) 25000 UT/250ML-% IV SOLN
800.0000 [IU]/h | INTRAVENOUS | Status: DC
  Administered 2019-11-19: 950 [IU]/h via INTRAVENOUS
  Filled 2019-11-19: qty 250

## 2019-11-19 NOTE — Progress Notes (Signed)
Pt had emesis episode when administering HS medications. Undigested food appearance and no signs of medication thrown up. Pt received antiemetic prior to emesis episode. Pt was only able to drink 10g of Lactulose due to upset stomach. Will continue to monitor.

## 2019-11-19 NOTE — Progress Notes (Signed)
ANTICOAGULATION CONSULT NOTE - Follow-Up  Pharmacy Consult for Warfarin Indication: RIJ DVT (10/21/19), CVA (10/20/19)  No Known Allergies  Patient Measurements: Height: 5\' 7"  (170.2 cm) Weight: (unable to obtain) IBW/kg (Calculated) : 61.6  Vital Signs: Temp: 98.8 F (37.1 C) (12/27 1341) BP: 118/69 (12/27 1341) Pulse Rate: 81 (12/27 1341)  Labs: Recent Labs    11/17/19 0504 11/17/19 0859 11/18/19 0717 11/18/19 1247 11/19/19 0637 11/19/19 1737  HGB  --  8.4*  --  7.6*  --  8.1*  HCT  --  27.1*  --  24.9*  --  23.1*  PLT  --  274  --  297  --  543*  APTT  --  65*  --   --   --   --   LABPROT 52.2*  --  23.4*  --  18.4*  --   INR 5.8*  --  2.1*  --  1.5*  --   HEPARINUNFRC  --   --   --   --   --  1.03*  CREATININE  --  5.67*  --  7.58*  --   --     Estimated Creatinine Clearance: 6.5 mL/min (A) (by C-G formula based on SCr of 7.58 mg/dL (H)).  Assessment: 68 yr old female with ESRD who presented on 10/20/19 with new CVA and RIJ DVT 10/21/19. Pharmacy consulted to dose warfarin.   INR subtherapeutic 1.5 following several days of held warfarin and supratherapeutic levels requiring Vit K 1 mg given 12/25. Given fragile INR and lack of oral food intake will dose warfarin conservatively.  Heparin level came back supratherapeutic at 1.03. Hgb 8.1, plt 543. No s/sx of bleeding. Heparin is infusing in L arm, level drawn from same arm due to restriction - but drawn appropriately.  Goal of Therapy:  INR 2-3 Heparin Level: 0.3-0.7 Monitor platelets by anticoagulation protocol: Yes   Plan:  - Reduce heparin rate to 800 units/hr - Warfarin 1mg  x1 tonight (noted interaction with Cipro)  - Also resuming Cipro on this patient per ID - Daily PT/INR, CBC Q Mon - Monitor for signs/symptoms of bleeding  Antonietta Jewel, PharmD, BCCCP Clinical Pharmacist  Phone: [phone]  Please check AMION for all [place] phone numbers After 10:00 PM, call [place]  [phone] 11/19/2019 6:23 PM

## 2019-11-19 NOTE — Progress Notes (Signed)
PROGRESS NOTE    [name]  [id number] DOB: 1951/11/17 DOA: 11/01/2019 PCP: Manon Hilding, MD   Brief Narrative: [name] is a 68 y.o. female with a history of ESRD on HD, mitral valve endocarditis, hepatic encephalopathy, chronic anemia, diabetes mellitus, CVA. Patient is currently admitted to inpatient rehab for post-CVA rehabilitation and was found to have worsening mentation.   Assessment & Plan:   Principal Problem:   Cerebrovascular accident (CVA) of right basal ganglia (HCC) Active Problems:   Slow transit constipation   Acute on chronic anemia   Labile blood pressure   Labile blood glucose   Diabetes mellitus type 2 in nonobese (HCC)   Bacteremia   Endocarditis of mitral valve   Septic embolism (HCC)   Seizure (HCC)   Candidiasis   Encephalopathy, hepatic (HCC)   Hyperammonemia (HCC)   Sleep disturbance   Hemodialysis-associated hypotension   Supratherapeutic INR   Gastroesophageal reflux disease   Decubitus ulcer of sacral region, unstageable ([place])   Ogilvie's syndrome   Encephalopathy Unknown etiology. Thought possibly secondary to delirium. Appears to be improved today but just somnolent possibly secondary to Seroquel overnight. Appears to be resolved. -Continue lactulose  Diabetes mellitus, type 2 Patient is on insulin NPH-regular (70-30) 4 units BID WC as an outpatient. Currently on SSI. Blood sugar somewhat controlled. -Continue SSI for now  Hyperammonemia History of hepatic encephalopathy -Continue lactulose  Abdominal pain Initially with symptoms on 12/23. Abdominal X-ray concerning for volvulus and CT scan significant for evidence of pneumatosis coli. 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 Abdominal pain is improved from early but she still is tender in the RUQ. -General surgery recommendations: GI consult -GI recommendations: Defer to rounding team  Discitis L3-L4. In setting of  bacterial endocarditis. Patient does have a history of sacral decubitus ulcer. Afebrile and normal WBC. Pelvic x-ray not suggestive of fracture. MRI obtained which shows concern for infectious process. Neurosurgery consulted and their concern is discitis; recommended ID re-consult. Patient has been afebrile and WBC is normal today. Patient currently on Ceftazidime and Ciprofloxacin -Per ID  Coagulopathy Secondary to Coumadin. INR appears to have reached plateau . No obvious source of bleeding. Hemoglobin stable. Given 1 mg of vitamin K with reversal of INR down to 2.1. Unsure of etiology but this could possibly be secondary to vitamin K deficiency.  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 Evaluated by neurology with LT EEG. Gabapentin discontinued. No reported recurrences.   Right IJ DVT Acute from hospitalization in November. Started on Coumadin. -Coumadin -Start heparin for bridge in setting of subtherapeutic INR and recent vitamin K administration  Mitral valve endocarditis Seen by cardiothoracic surgery on medical admission. No plan for replacement valve at this acute period but will need follow-up as an outpatient. Was on Cefepime/cipro, transitioned to Cefepime monotherapy and now is on Ceftazidime and Cipro -Per ID  Essential hypertension -Continue Imdur  CVA Recently hospitalized and is now undergoing inpatient rehabilitation. Per stroke team, does not appear aspirin was considered for continuation on discharge. -Continue Coumadin  Pressure injury Medial sacrum, not POA    DVT prophylaxis: Coumadin (currently held secondary to supertherapeutic INR) Code Status:   Code Status: DNR Family Communication: None at bedside   Procedures:   HD  Antimicrobials:  Cefepime  Ceftazidime   Subjective: Back pain persists but manageable. Abdominal pain improved but still present.  Objective: Vitals:   11/18/19 1630 11/18/19 1932 11/19/19 0358 11/19/19 0844  BP:  132/84 129/74 Marland Kitchen)  145/62 (!) 107/59  Pulse: 87 91 87 80  Resp: 16 14 15    Temp: 98.6 F (37 C) 99.1 F (37.3 C) 99.1 F (37.3 C)   TempSrc:      SpO2: 100% 100%    Weight:      Height:        Intake/Output Summary (Last 24 hours) at 11/19/2019 0959 Last data filed at 11/19/2019 0745 Gross per 24 hour  Intake 491 ml  Output 273 ml  Net 218 ml   Filed Weights   11/14/19 0326 11/15/19 1318 11/15/19 1659  Weight: 59.9 kg 60 kg 59.2 kg    Examination:  General exam: Appears calm and comfortable Respiratory system: Clear to auscultation. Respiratory effort normal. Cardiovascular system: S1 & S2 heard, RRR. 2/6 systolic murmur Gastrointestinal system: Abdomen is nondistended, soft and tender in RUQ. Normal bowel sounds heard. Central nervous system: Alert and oriented. No focal neurological deficits. Extremities: No edema. No calf tenderness Skin: No cyanosis. No rashes Psychiatry: Judgement and insight appear normal. Mood & affect appropriate.     Data Reviewed: I have personally reviewed following labs and imaging studies  CBC: Recent Labs  Lab 11/13/19 1134 11/15/19 1036 11/17/19 0859 11/18/19 1247  WBC 12.6* 12.6* 9.1 8.7  HGB 9.1* 8.5* 8.4* 7.6*  HCT 28.4* 26.3* 27.1* 24.9*  MCV 91.0 88.9 91.2 92.2  PLT 199 241 274 [id number]   Basic Metabolic Panel: Recent Labs  Lab 11/13/19 1248 11/15/19 1036 11/17/19 0859 11/18/19 1247  NA 131* 135 136 134*  K 4.4 3.8 3.9 3.9  CL 94* 96* 96* 97*  CO2 22 22 23 22   GLUCOSE 198* 138* 150* 119*  BUN 57* 35* 30* 43*  CREATININE 8.41* 6.38* 5.67* 7.58*  CALCIUM 8.3* 8.6* 8.5* 8.2*  PHOS 5.2*  --   --  4.2   GFR: Estimated Creatinine Clearance: 6.5 mL/min (A) (by C-G formula based on SCr of 7.58 mg/dL (H)). Liver Function Tests: Recent Labs  Lab 11/13/19 1248 11/15/19 1036 11/18/19 1247  AST  --  20  --   ALT  --  57*  --   ALKPHOS  --  111  --   BILITOT  --  0.4  --   PROT  --  6.6  --   ALBUMIN 1.7* 2.0* 1.7*    No results for input(s): LIPASE, AMYLASE in the last 168 hours. Recent Labs  Lab 11/14/19 0522 11/15/19 1036  AMMONIA 25 33   Coagulation Profile: Recent Labs  Lab 11/15/19 0718 11/16/19 0558 11/17/19 0504 11/18/19 0717 11/19/19 0637  INR 5.6* 5.2* 5.8* 2.1* 1.5*   Cardiac Enzymes: No results for input(s): CKTOTAL, CKMB, CKMBINDEX, TROPONINI in the last 168 hours. BNP (last 3 results) No results for input(s): PROBNP in the last 8760 hours. HbA1C: No results for input(s): HGBA1C in the last 72 hours. CBG: Recent Labs  Lab 11/18/19 0615 11/18/19 1142 11/18/19 1641 11/18/19 2111 11/19/19 0638  GLUCAP 115* 125* 71 119* 103*   Lipid Profile: No results for input(s): CHOL, HDL, LDLCALC, TRIG, CHOLHDL, LDLDIRECT in the last 72 hours. Thyroid Function Tests: No results for input(s): TSH, T4TOTAL, FREET4, T3FREE, THYROIDAB in the last 72 hours. Anemia Panel: No results for input(s): VITAMINB12, FOLATE, FERRITIN, TIBC, IRON, RETICCTPCT in the last 72 hours. Sepsis Labs: No results for input(s): PROCALCITON, LATICACIDVEN in the last 168 hours.  No results found for this or any previous visit (from the past 240 hour(s)).       Radiology  Studies: No results found.      Scheduled Meds: . acetaminophen  500 mg Oral TID  . calcium carbonate  1 tablet Oral 2 times per day  . carvedilol  12.5 mg Oral BID  . Chlorhexidine Gluconate Cloth  6 each Topical Q0600  . ciprofloxacin  500 mg Oral Q24H  . [START ON 11/20/2019] darbepoetin (ARANESP) injection - DIALYSIS  100 mcg Intravenous Q Mon-HD  . doxercalciferol  1.5 mcg Intravenous Q M,W,F-HD  . feeding supplement (ENSURE ENLIVE)  237 mL Oral BID BM  . heparin  3,000 Units Intravenous Once  . insulin aspart  0-9 Units Subcutaneous TID WC  . isosorbide mononitrate  30 mg Oral QPC lunch  . lactulose  20 g Oral TID  . Melatonin  3 mg Oral QHS  . multivitamin  1 tablet Oral QPC lunch  . pantoprazole  40 mg Oral Daily  .  Warfarin - Pharmacist Dosing Inpatient   Does not apply q1800   Continuous Infusions: . sodium chloride    . sodium chloride    . sodium chloride    . sodium chloride    . cefTAZidime (FORTAZ)  IV Stopped (11/18/19 2120)  . [START ON 11/20/2019] ferric gluconate (FERRLECIT/NULECIT) IV    . heparin       LOS: 18 days     Cordelia Poche, MD [facility] 11/19/2019, 9:59 AM  If 7PM-7AM, please contact night-coverage [email or web address]

## 2019-11-19 NOTE — Progress Notes (Signed)
RN was called by pharmacy due to pt's elevated heparin level. Pt currently on a continuous heparin drip at 950 units/hr. It's suspected that elevated heparin levels due to lab draws from LUE, where heparin is being infused. New orders received to decrease rate to 800 units per hour. Pt currently resting, no s/s of bleeding, will continue to monitor.

## 2019-11-19 NOTE — Progress Notes (Signed)
ANTICOAGULATION CONSULT NOTE - Follow-Up  Pharmacy Consult for Warfarin Indication: RIJ DVT (10/21/19), CVA (10/20/19)  No Known Allergies  Patient Measurements: Height: 5\' 7"  (170.2 cm) Weight: (unable to obtain) IBW/kg (Calculated) : 61.6  Vital Signs: Temp: 99.1 F (37.3 C) (12/27 0358) BP: 107/59 (12/27 0844) Pulse Rate: 80 (12/27 0844)  Labs: Recent Labs    11/17/19 0504 11/17/19 0859 11/18/19 0717 11/18/19 1247 11/19/19 0637  HGB  --  8.4*  --  7.6*  --   HCT  --  27.1*  --  24.9*  --   PLT  --  274  --  297  --   APTT  --  65*  --   --   --   LABPROT 52.2*  --  23.4*  --  18.4*  INR 5.8*  --  2.1*  --  1.5*  CREATININE  --  5.67*  --  7.58*  --     Estimated Creatinine Clearance: 6.5 mL/min (A) (by C-G formula based on SCr of 7.58 mg/dL (H)).  Assessment: 68 yr old female with ESRD who presented on 10/20/19 with new CVA and RIJ DVT 10/21/19. Pharmacy consulted to dose warfarin.   INR subtherapeutic 1.5 following several days of held warfarin and supratherapeutic levels requiring Vit K 1 mg given 12/25. Given fragile INR and lack of oral food intake will dose warfarin conservatively.  Bridge therapy with Heparin has been initiated. Given CVA ~1 month ago will use conservative heparin bolus and infusion.  Goal of Therapy:  INR 2-3 Heparin Level: 0.3-0.7 Monitor platelets by anticoagulation protocol: Yes   Plan:  - Heparin 3000 unit bolus - Heparin gtt at 950 units/hr - Warfarin 1mg  x1 tonight (noted interaction with Cipro)  - Also resuming Cipro on this patient per ID - Daily PT/INR, CBC Q Mon - Monitor for signs/symptoms of bleeding  Georga Bora, PharmD Clinical Pharmacist 11/19/2019 10:12 AM Please check AMION for all [place] numbers

## 2019-11-19 NOTE — Progress Notes (Signed)
[place] PHYSICAL MEDICINE & REHABILITATION PROGRESS NOTE   Subjective/Complaints: Patient seen lying in bed this morning.  She states she slept well overnight.   Has some coccyx pain. Denis constipation.    ROS: Denies CP, SOB, N/V/D  Objective:   No results found. Recent Labs    11/17/19 0859 11/18/19 1247  WBC 9.1 8.7  HGB 8.4* 7.6*  HCT 27.1* 24.9*  PLT 274 297   Recent Labs    11/17/19 0859 11/18/19 1247  NA 136 134*  K 3.9 3.9  CL 96* 97*  CO2 23 22  GLUCOSE 150* 119*  BUN 30* 43*  CREATININE 5.67* 7.58*  CALCIUM 8.5* 8.2*    Intake/Output Summary (Last 24 hours) at 11/19/2019 1402 Last data filed at 11/19/2019 0745 Gross per 24 hour  Intake 491 ml  Output 273 ml  Net 218 ml     Physical Exam: Vital Signs Blood pressure 118/69, pulse 81, temperature 98.8 F (37.1 C), resp. rate 16, height 5\' 7"  (1.702 m), weight 59.2 kg, SpO2 100 %.  Constitutional: No distress . Vital signs reviewed. HENT: Normocephalic.  Atraumatic. Eyes: EOMI. No discharge. Cardiovascular: No JVD. Respiratory: Normal effort.  No stridor. GI: Non-distended. Skin: Sacral ulcer not examined today Psych: Flat.   Musc: No edema in extremities.  No tenderness in extremities. Neurological: Alert and oriented x2 Motor: RUE: 5/5 proximal to distal RLE: HF, KE 3+/5, ADF 4/5, stable LUE: 4+/5 proximal to distal LLE: HF, KE 3/5, ADF 3+/5, stable  Assessment/Plan: 1. Functional deficits secondary to right basal ganglia stroke which require 3+ hours per day of interdisciplinary therapy in a comprehensive inpatient rehab setting.  Physiatrist is providing close team supervision and 24 hour management of active medical problems listed below.  Physiatrist and rehab team continue to assess barriers to discharge/monitor patient progress toward functional and medical goals  Care Tool:  Bathing    Body parts bathed by patient: Right arm, Left arm, Chest, Abdomen, Front perineal area,  Right upper leg, Left upper leg, Face   Body parts bathed by helper: Front perineal area, Buttocks, Right upper leg, Left upper leg, Right lower leg, Left lower leg, Face     Bathing assist Assist Level: Total Assistance - Patient < 25%     Upper Body Dressing/Undressing Upper body dressing   What is the patient wearing?: Pull over shirt    Upper body assist Assist Level: Minimal Assistance - Patient > 75%    Lower Body Dressing/Undressing Lower body dressing      What is the patient wearing?: Pants     Lower body assist Assist for lower body dressing: Moderate Assistance - Patient 50 - 74%     Toileting Toileting    Toileting assist Assist for toileting: Maximal Assistance - Patient 25 - 49%     Transfers Chair/bed transfer  Transfers assist     Chair/bed transfer assist level: Dependent - mechanical lift     Locomotion Ambulation   Ambulation assist   Ambulation activity did not occur: Safety/medical concerns(back pain, bilateral LE weakness, poor bilateral knee control)  Assist level: 2 helpers Assistive device: Walker-rolling Max distance: 5ft   Walk 10 feet activity   Assist  Walk 10 feet activity did not occur: Safety/medical concerns(back pain, bilateral LE weakness, poor bilateral knee control)  Assist level: 2 helpers Assistive device: Walker-rolling   Walk 50 feet activity   Assist Walk 50 feet with 2 turns activity did not occur: Safety/medical concerns(back pain, bilateral LE  weakness, poor bilateral knee control)  Assist level: 2 helpers Assistive device: Walker-rolling    Walk 150 feet activity   Assist Walk 150 feet activity did not occur: Safety/medical concerns(back pain, bilateral LE weakness, poor bilateral knee control)         Walk 10 feet on uneven surface  activity   Assist Walk 10 feet on uneven surfaces activity did not occur: Safety/medical concerns(back pain, bilateral LE weakness, poor bilateral knee  control)         Wheelchair     Assist Will patient use wheelchair at discharge?: Yes Type of Wheelchair: Manual Wheelchair activity did not occur: Safety/medical concerns(Pt could not tolerate sitting in WC for long enough to complete activity due to low back pain)  Wheelchair assist level: Minimal Assistance - Patient > 75% Max wheelchair distance: 58ft    Wheelchair 50 feet with 2 turns activity    Assist    Wheelchair 50 feet with 2 turns activity did not occur: Safety/medical concerns(Pt could not tolerate sitting in WC for long enough to complete activity due to low back pain)       Wheelchair 150 feet activity     Assist  Wheelchair 150 feet activity did not occur: Safety/medical concerns(Pt could not tolerate sitting in WC for long enough to complete activity due to low back pain)       Blood pressure 118/69, pulse 81, temperature 98.8 F (37.1 C), resp. rate 16, height 5\' 7"  (1.702 m), weight 59.2 kg, SpO2 100 %.    Medical Problem List and Plan: 1.  Impaired gait with left side weakness secondary to right basal ganglia infarction as well as small cortical infarct left frontal lobe, now with delirium  Continue bedrest  CT head on 12/13 personally reviewed, showing some improvement, MRI personally reviewed, stable. Repeat MRI ordered again by ID, showing stable infarcts  Chest x-ray personally reviewed, unremarkable for infection  Discussed with neurology results of the EEG -findings possibly related to uremia +/- cefepime  Discussed with psychiatry, continue medical management  Discussed with hospitalist, appreciate recs, discussed with hospitalist again on 12/25  Plan for palliative care consult next week 2.  Antithrombotics: -DVT/anticoagulation: Chronic Coumadin for right IJ acute DVT.  Plan anticoagulation x3 months  INR critical value of 5.8 and trending up.  Discussed with pharmacy again-we will plan for 0.5mg  of vitamin K.  12/26: INR 2.1              -antiplatelet therapy: N/A 3. Pain Management chronic back and left hip pain: Neurontin 100 mg on hold  TLSO back brace for comfort  12/27: Lidocaine patch to coccyx 4. Mood: Provide emotional support             -antipsychotic agents: N/A 5. Neuropsych: This patient is not capable of making decisions on her own behalf. 6. Skin/Wound Care: Routine skin checks  Santyl to sacral wound 7. Fluids/Electrolytes/Nutrition: Routine in and outs 8.  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  New HD tunneled catheter placed 10/26/2019. 9.  ID/discitis/recurrent Pseudomonas bacteremia.    Continue Maxipime as directed with infectious disease to discuss duration of antibiotic therapies anticipate 6 weeks.  Cipro DC'd by ID due to seizure risk  Seen by neurosurgery on 12/25, no surgical intervention recommended at this point, however recommended reconsulting ID  Per ID, no changes in medications, of note, pt did NOT seizures 10.  Hemodialysis MWF.  Follow-up per renal services.  11.  Diabetes mellitus.  Hemoglobin A1c 7.6.  SSI. CBG (last 3)  Recent Labs    11/18/19 2111 11/19/19 0638 11/19/19 1136  GLUCAP 119* 103* 114*   Relatively controlled on 12/24, 12/25, 12/26, 12/27 12.  Hypertension.  Imdur 30 mg daily  Coreg 6.25 mg twice daily, increased to 12.5 on 12/15, parameters placed.    Monitor with increased mobility.  Relatively controlled on 12/24, 12/25 13.  Acute on chronic anemia.    Hemoglobin 8.4 on 12/25  Continue to monitor 14. Constipation:  See #16  Abd xray showing?  Volvulus  CT abdomen/pelvis showing possible Ogilvie's 15.  Candidiasis  Diflucan ordered on 12/16  Repeat Ucx not collected yet on 12/25, 12/26 16.  Hyperammonemia with hepatic encephalopathy  Lactulose started on 12/17, encourage compliance  Ammonia WNL on 12/22 17. Sleep disturance  Melatonin added on 12/17  Sleep chart  ordered  Improving 18.  Leukocytosis  WBCs 12.6 on 12/21, labs with HD  Afebrile  Continue to monitor 19.  GERD  Home Protonix ordered on 12/20 20.  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  Appreciate surgery and GI following  12/26: GI planning possible endoscopy   LOS: 18 days A FACE TO FACE EVALUATION WAS PERFORMED  Kalya Troeger P Deboraha Goar 11/19/2019, 2:02 PM

## 2019-11-19 NOTE — Progress Notes (Addendum)
[facility] Progress Note   Subjective:  Completed HD yesterday with minimal UF. Seen in room. No new complaints, backside painful when she lies on it.   Objective Vitals:   11/18/19 1630 11/18/19 1932 11/19/19 0358 11/19/19 0844  BP: 132/84 129/74 (!) 145/62 (!) 107/59  Pulse: 87 91 87 80  Resp: 16 14 15    Temp: 98.6 F (37 C) 99.1 F (37.3 C) 99.1 F (37.3 C)   TempSrc:      SpO2: 100% 100%    Weight:      Height:        Physical Exam General: Chronically ill appearing female, nad  Heart: RRR  Lungs: clear, bilaterally  Abdomen: soft nontender  Extremities: No LE edema  Dialysis Access: R IJ TDC in place    Weight change:    Additional Objective Labs: Basic Metabolic Panel: Recent Labs  Lab 11/13/19 1248 11/15/19 1036 11/17/19 0859 11/18/19 1247  NA 131* 135 136 134*  K 4.4 3.8 3.9 3.9  CL 94* 96* 96* 97*  CO2 22 22 23 22   GLUCOSE 198* 138* 150* 119*  BUN 57* 35* 30* 43*  CREATININE 8.41* 6.38* 5.67* 7.58*  CALCIUM 8.3* 8.6* 8.5* 8.2*  PHOS 5.2*  --   --  4.2   CBC: Recent Labs  Lab 11/13/19 1134 11/15/19 1036 11/17/19 0859 11/18/19 1247  WBC 12.6* 12.6* 9.1 8.7  HGB 9.1* 8.5* 8.4* 7.6*  HCT 28.4* 26.3* 27.1* 24.9*  MCV 91.0 88.9 91.2 92.2  PLT 199 241 274 297   Blood Culture    Component Value Date/Time   SDES URINE, RANDOM 11/06/2019 1107   SPECREQUEST  11/06/2019 1107    NONE Performed at [facility], [place] [address] [place], [place] [address]    CULT 80,000 COLONIES/mL YEAST (A) 11/06/2019 1107   REPTSTATUS 11/07/2019 FINAL 11/06/2019 1107     Medications: . sodium chloride    . sodium chloride    . sodium chloride    . sodium chloride    . cefTAZidime (FORTAZ)  IV Stopped (11/18/19 2120)  . [START ON 11/20/2019] ferric gluconate (FERRLECIT/NULECIT) IV     . acetaminophen  500 mg Oral TID  . calcium carbonate  1 tablet Oral 2 times per day  . carvedilol  12.5 mg Oral BID  . Chlorhexidine Gluconate  Cloth  6 each Topical Q0600  . ciprofloxacin  500 mg Oral Q24H  . [START ON 11/20/2019] darbepoetin (ARANESP) injection - DIALYSIS  100 mcg Intravenous Q Mon-HD  . doxercalciferol  1.5 mcg Intravenous Q M,W,F-HD  . feeding supplement (ENSURE ENLIVE)  237 mL Oral BID BM  . insulin aspart  0-9 Units Subcutaneous TID WC  . isosorbide mononitrate  30 mg Oral QPC lunch  . lactulose  20 g Oral TID  . Melatonin  3 mg Oral QHS  . multivitamin  1 tablet Oral QPC lunch  . pantoprazole  40 mg Oral Daily  . Warfarin - Pharmacist Dosing Inpatient   Does not apply q1800    Dialysis Orders:  [facility] MWF3.5 hours TDC - mult failed accesses Profile 1 - pressure drops very easily;max is pull 1.1 an hour; do not pull rinseback revaclear 300 dialyzer 400/600 2 K 2.5 Ca EDW 62.5  Epo 1200 U q HD; iron 50 mg/weekly 1.5 mcg hectoral each tx  Assessment/Plan: 1. Recurrent Pseudomonas bacteremia with evidence of mitral valve endocarditisthat is post [facility] exchange for infection focus control. Seen by CTA w/no  plan for surgical intervention. Completed 26 days cefepime. Antibiotics changed to [facility] per ID 12/25.  2. Progression L3-4 discitis on MRI. Evaluated by neurosurgery. No indication for surgical intervention. ID reconsulted/following. Antibiotics changed to Fortaz/Cipro. Defer aspiration for now.  3. ESRD: MWF HD. Next HD 12/28.  Added K+ bath here. Tight heparin.  4. Anemia:hgb  Hgb trending down. Next Aranesp dose 12/28. Increased to 100 q week. --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  6. 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 Ongoing inpatient rehabilitation for management of neurological deficits. Repeat MRI showedno acute changes 7. Hypertension/volume : BP low/stable.  Net UF 249ml. UF 0.5 next HD.   8.Nutrition -diet liberalized to regular - has supplements/multivits ordered - alb very low - not eating much - contributing to weakness 9. Ascending colon pneumatosis - seen on CT 12/24. No surgical intervention. GI consult for possible endoscopic evaluation.  10. Deconditioning -variable levels of functioning appear related to mental status   [name] PA-C [facility] Pager [phone] 11/19/2019,9:22 AM  LOS: 18 days

## 2019-11-20 ENCOUNTER — Inpatient Hospital Stay (HOSPITAL_COMMUNITY): Payer: Medicare PPO

## 2019-11-20 ENCOUNTER — Inpatient Hospital Stay (HOSPITAL_COMMUNITY): Payer: Medicare PPO | Admitting: Speech Pathology

## 2019-11-20 ENCOUNTER — Inpatient Hospital Stay (HOSPITAL_COMMUNITY): Payer: Medicare PPO | Admitting: Occupational Therapy

## 2019-11-20 DIAGNOSIS — I340 Nonrheumatic mitral (valve) insufficiency: Secondary | ICD-10-CM

## 2019-11-20 DIAGNOSIS — I361 Nonrheumatic tricuspid (valve) insufficiency: Secondary | ICD-10-CM

## 2019-11-20 LAB — RENAL FUNCTION PANEL
Albumin: 1.8 g/dL — ABNORMAL LOW (ref 3.5–5.0)
Anion gap: 13 (ref 5–15)
BUN: 28 mg/dL — ABNORMAL HIGH (ref 8–23)
CO2: 24 mmol/L (ref 22–32)
Calcium: 8.1 mg/dL — ABNORMAL LOW (ref 8.9–10.3)
Chloride: 99 mmol/L (ref 98–111)
Creatinine, Ser: 6.32 mg/dL — ABNORMAL HIGH (ref 0.44–1.00)
GFR calc Af Amer: 7 mL/min — ABNORMAL LOW (ref >60)
GFR calc non Af Amer: 6 mL/min — ABNORMAL LOW (ref >60)
Glucose, Bld: 102 mg/dL — ABNORMAL HIGH (ref 70–99)
Phosphorus: 3.6 mg/dL (ref 2.5–4.6)
Potassium: 4 mmol/L (ref 3.5–5.1)
Sodium: 136 mmol/L (ref 135–145)

## 2019-11-20 LAB — PROTIME-INR
INR: 2 — ABNORMAL HIGH (ref 0.8–1.2)
Prothrombin Time: 22.6 seconds — ABNORMAL HIGH (ref 11.4–15.2)

## 2019-11-20 LAB — CBC
HCT: 28.3 % — ABNORMAL LOW (ref 36.0–46.0)
Hemoglobin: 8.6 g/dL — ABNORMAL LOW (ref 12.0–15.0)
MCH: 28.2 pg (ref 26.0–34.0)
MCHC: 30.4 g/dL (ref 30.0–36.0)
MCV: 92.8 fL (ref 80.0–100.0)
Platelets: 247 10*3/uL (ref 150–400)
RBC: 3.05 MIL/uL — ABNORMAL LOW (ref 3.87–5.11)
RDW: 18.6 % — ABNORMAL HIGH (ref 11.5–15.5)
WBC: 11.1 10*3/uL — ABNORMAL HIGH (ref 4.0–10.5)
nRBC: 0 % (ref 0.0–0.2)

## 2019-11-20 LAB — GLUCOSE, CAPILLARY
Glucose-Capillary: 88 mg/dL (ref 70–99)
Glucose-Capillary: 127 mg/dL — ABNORMAL HIGH (ref 70–99)
Glucose-Capillary: 73 mg/dL (ref 70–99)
Glucose-Capillary: 110 mg/dL — ABNORMAL HIGH (ref 70–99)

## 2019-11-20 LAB — ECHOCARDIOGRAM LIMITED
Height: 67 in
Weight: 2080 oz

## 2019-11-20 LAB — HEPARIN LEVEL (UNFRACTIONATED): Heparin Unfractionated: 2.2 IU/mL — ABNORMAL HIGH (ref 0.30–0.70)

## 2019-11-20 LAB — PATHOLOGIST SMEAR REVIEW

## 2019-11-20 MED ORDER — SODIUM CHLORIDE 0.9 % IV SOLN: 100.0000 mL | INTRAVENOUS | Status: DC | PRN

## 2019-11-20 MED ORDER — HEPARIN SODIUM (PORCINE) 1000 UNIT/ML IJ SOLN
INTRAMUSCULAR | Status: AC
  Administered 2019-11-20: 3200 [IU] via INTRAVENOUS_CENTRAL
  Filled 2019-11-20: qty 4

## 2019-11-20 MED ORDER — HEPARIN SODIUM (PORCINE) 1000 UNIT/ML DIALYSIS
20.0000 [IU]/kg | INTRAMUSCULAR | Status: DC | PRN
  Filled 2019-11-20 (×2): qty 2

## 2019-11-20 MED ORDER — HEPARIN SODIUM (PORCINE) 1000 UNIT/ML DIALYSIS
1000.0000 [IU] | INTRAMUSCULAR | Status: DC | PRN
  Filled 2019-11-20: qty 1

## 2019-11-20 MED ORDER — DARBEPOETIN ALFA 100 MCG/0.5ML IJ SOSY
PREFILLED_SYRINGE | INTRAMUSCULAR | Status: AC
  Filled 2019-11-20: qty 0.5

## 2019-11-20 MED ORDER — DOXERCALCIFEROL 4 MCG/2ML IV SOLN
INTRAVENOUS | Status: AC
  Filled 2019-11-20: qty 2

## 2019-11-20 MED ORDER — WARFARIN 0.5 MG HALF TABLET
0.5000 mg | ORAL_TABLET | Freq: Once | ORAL | Status: AC
  Administered 2019-11-20: 0.5 mg via ORAL
  Filled 2019-11-20: qty 1

## 2019-11-20 NOTE — Progress Notes (Signed)
Subjective: Patient reports 3 episodes of nausea with vomiting last evening.  Otherwise she is tolerating regular diet.  She had several bowel movements last evening.  Objective: Vital signs in last 24 hours: Temp:  [98.8 F (37.1 C)-98.9 F (37.2 C)] 98.9 F (37.2 C) (12/28 0528) Pulse Rate:  [81-85] 84 (12/28 0528) Resp:  [16] 16 (12/28 0528) BP: (106-150)/(58-88) 150/88 (12/28 0528) SpO2:  [100 %] 100 % (12/28 0528) Weight:  [59 kg] 59 kg (12/28 0528) Last BM Date: 11/19/19 Afebrile vital signs are stable WBC 10.9, hemoglobin 8.1, hematocrit 23, platelets 543,000.(12/27) No radiology studies since 12/24 CT scan 12/24 shows mildly distended ascending colon with wall thickening associated with evidence of pneumonitis coli.  No free air no evidence of SBO no evidence of mesenteric thrombosis/occlusion findings similar to mesenteric ischemia S or infectious colitis.   Intake/Output from previous day: 12/27 0701 - 12/28 0700 In: 770.6 [P.O.:368; I.V.:202.6; IV Piggyback:200] Out: -  Intake/Output this shift: Total I/O In: 120 [P.O.:120] Out: -   General appearance: alert, cooperative and no distress GI: soft, non-tender; bowel sounds normal; no masses,  no organomegaly and She is currently undergoing echocardiogram.  points of abdominal pain.  Abdomen is soft and nontender.  Few bowel sounds.  Tolerating diet with multiple BMs yesterday.  Lab Results:  Recent Labs    11/18/19 1247 11/19/19 1737  WBC 8.7 10.9*  HGB 7.6* 8.1*  HCT 24.9* 23.1*  PLT 297 543*    BMET Recent Labs    11/18/19 1247  NA 134*  K 3.9  CL 97*  CO2 22  GLUCOSE 119*  BUN 43*  CREATININE 7.58*  CALCIUM 8.2*   PT/INR Recent Labs    11/18/19 0717 11/19/19 0637  LABPROT 23.4* 18.4*  INR 2.1* 1.5*    Recent Labs  Lab 11/13/19 1248 11/15/19 1036 11/18/19 1247  AST  --  20  --   ALT  --  57*  --   ALKPHOS  --  111  --   BILITOT  --  0.4  --   PROT  --  6.6  --    ALBUMIN 1.7* 2.0* 1.7*     Lipase     Component Value Date/Time   LIPASE 41 09/10/2019 0015     Medications: . acetaminophen  500 mg Oral TID  . calcium carbonate  1 tablet Oral 2 times per day  . carvedilol  12.5 mg Oral BID  . Chlorhexidine Gluconate Cloth  6 each Topical Q0600  . ciprofloxacin  500 mg Oral Q24H  . darbepoetin (ARANESP) injection - DIALYSIS  100 mcg Intravenous Q Mon-HD  . doxercalciferol  1.5 mcg Intravenous Q M,W,F-HD  . feeding supplement (ENSURE ENLIVE)  237 mL Oral BID BM  . insulin aspart  0-9 Units Subcutaneous TID WC  . isosorbide mononitrate  30 mg Oral QPC lunch  . lactulose  20 g Oral TID  . lidocaine  1 patch Transdermal Q24H  . Melatonin  3 mg Oral QHS  . multivitamin  1 tablet Oral QPC lunch  . pantoprazole  40 mg Oral Daily  . Warfarin - Pharmacist Dosing Inpatient   Does not apply q1800   . sodium chloride    . sodium chloride    . sodium chloride    . sodium chloride    . cefTAZidime (FORTAZ)  IV Stopped (11/19/19 2130)  . ferric gluconate (FERRLECIT/NULECIT) IV    . heparin 800 Units/hr (11/20/19 0700)  Assessment/Plan HTN DM ESRD on HD HLD Endocarditis of MV Chronic anticoagulation on coumadin - INR 2.1 today Hepatic encephalopathy Chronic anemia Recent CVAcurrently admitted to inpt rehab for post-stroke rehabilitation  Destruction of the L3 and L4 vertebral bodies- NS following  Ascending colon pneumatosis -seen on CT12/24, no free air,she could have underlying Ogilvie's given its dilation on the right and gradual tapering down in the left - abdomen soft with only mild tenderness, she is tolerating small amount PO and having bowel function. Stable with no role for acute surgical intervention - GI consult for possible endoscopic evaluation  ID -currently maxipime 12/9 - 12/06/19/per ID DR. Hatcher >> day 19 FEN -reg diet VTE -heparin drip Foley -none Follow up -TBD  Plan: Awaiting GI work-up.  There  does not appear to be any acute GI issue currently.  We will follow with you.       LOS: 19 days    [name] 11/20/2019 Please see Amion

## 2019-11-20 NOTE — Progress Notes (Signed)
[place] PHYSICAL MEDICINE & REHABILITATION PROGRESS NOTE   Subjective/Complaints: Patient seen laying in bed this morning.  She states she slept well overnight, confirmed with son who is at bedside.  She was seen by hospitalist yesterday.  ROS: Denies CP, SOB, N/V/D  Objective:   No results found. Recent Labs    11/18/19 1247 11/19/19 1737  WBC 8.7 10.9*  HGB 7.6* 8.1*  HCT 24.9* 23.1*  PLT 297 543*   Recent Labs    11/18/19 1247  NA 134*  K 3.9  CL 97*  CO2 22  GLUCOSE 119*  BUN 43*  CREATININE 7.58*  CALCIUM 8.2*    Intake/Output Summary (Last 24 hours) at 11/20/2019 1021 Last data filed at 11/20/2019 0904 Gross per 24 hour  Intake 772.59 ml  Output --  Net 772.59 ml     Physical Exam: Vital Signs Blood pressure (!) 150/88, pulse 84, temperature 98.9 F (37.2 C), resp. rate 16, height 5\' 7"  (1.702 m), weight 59 kg, SpO2 100 %.  Constitutional: No distress . Vital signs reviewed. HENT: Normocephalic.  Atraumatic. Eyes: EOMI. No discharge. Cardiovascular: No JVD. Respiratory: Normal effort.  No stridor. GI: Non-distended. Skin: Sacral ulcer not examined today. Psych: Flat. Musc: No edema in extremities.  No tenderness in extremities. Neurological: Alert and oriented x3 Motor: RUE: 5/5 proximal to distal RLE: HF, KE 3+/5, ADF 4/5, improving LUE: 4+/5 proximal to distal LLE: HF, KE 3/5, ADF 3+/5, improving  Assessment/Plan: 1. Functional deficits secondary to right basal ganglia stroke which require 3+ hours per day of interdisciplinary therapy in a comprehensive inpatient rehab setting.  Physiatrist is providing close team supervision and 24 hour management of active medical problems listed below.  Physiatrist and rehab team continue to assess barriers to discharge/monitor patient progress toward functional and medical goals  Care Tool:  Bathing    Body parts bathed by patient: Right arm, Left arm, Chest, Abdomen, Front perineal area, Right  upper leg, Left upper leg, Face   Body parts bathed by helper: Front perineal area, Buttocks, Right upper leg, Left upper leg, Right lower leg, Left lower leg, Face     Bathing assist Assist Level: Total Assistance - Patient < 25%     Upper Body Dressing/Undressing Upper body dressing   What is the patient wearing?: Pull over shirt    Upper body assist Assist Level: Minimal Assistance - Patient > 75%    Lower Body Dressing/Undressing Lower body dressing      What is the patient wearing?: Pants     Lower body assist Assist for lower body dressing: Moderate Assistance - Patient 50 - 74%     Toileting Toileting    Toileting assist Assist for toileting: Maximal Assistance - Patient 25 - 49%     Transfers Chair/bed transfer  Transfers assist     Chair/bed transfer assist level: Dependent - mechanical lift     Locomotion Ambulation   Ambulation assist   Ambulation activity did not occur: Safety/medical concerns(back pain, bilateral LE weakness, poor bilateral knee control)  Assist level: 2 helpers Assistive device: Walker-rolling Max distance: 69ft   Walk 10 feet activity   Assist  Walk 10 feet activity did not occur: Safety/medical concerns(back pain, bilateral LE weakness, poor bilateral knee control)  Assist level: 2 helpers Assistive device: Walker-rolling   Walk 50 feet activity   Assist Walk 50 feet with 2 turns activity did not occur: Safety/medical concerns(back pain, bilateral LE weakness, poor bilateral knee control)  Assist level:  2 helpers Assistive device: Walker-rolling    Walk 150 feet activity   Assist Walk 150 feet activity did not occur: Safety/medical concerns(back pain, bilateral LE weakness, poor bilateral knee control)         Walk 10 feet on uneven surface  activity   Assist Walk 10 feet on uneven surfaces activity did not occur: Safety/medical concerns(back pain, bilateral LE weakness, poor bilateral knee  control)         Wheelchair     Assist Will patient use wheelchair at discharge?: Yes Type of Wheelchair: Manual Wheelchair activity did not occur: Safety/medical concerns(Pt could not tolerate sitting in WC for long enough to complete activity due to low back pain)  Wheelchair assist level: Minimal Assistance - Patient > 75% Max wheelchair distance: 28ft    Wheelchair 50 feet with 2 turns activity    Assist    Wheelchair 50 feet with 2 turns activity did not occur: Safety/medical concerns(Pt could not tolerate sitting in WC for long enough to complete activity due to low back pain)       Wheelchair 150 feet activity     Assist  Wheelchair 150 feet activity did not occur: Safety/medical concerns(Pt could not tolerate sitting in WC for long enough to complete activity due to low back pain)       Blood pressure (!) 150/88, pulse 84, temperature 98.9 F (37.2 C), resp. rate 16, height 5\' 7"  (1.702 m), weight 59 kg, SpO2 100 %.    Medical Problem List and Plan: 1.  Impaired gait with left side weakness secondary to right basal ganglia infarction as well as small cortical infarct left frontal lobe  Continue CIR  CT head on 12/13 personally reviewed, showing some improvement, MRI personally reviewed, stable. Repeat MRI ordered again by ID, showing stable infarcts  Chest x-ray personally reviewed, unremarkable for infection  Discussed with neurology results of the EEG -findings possibly related to uremia +/- cefepime  Discussed with psychiatry, continue medical management  Discussed with hospitalist, appreciate recs, discussed with hospitalist again on 12/25  Plan for palliative care consult next week 2.  Antithrombotics: -DVT/anticoagulation: Chronic Coumadin for right IJ acute DVT.  Plan anticoagulation x3 months  INR 2.0 on 12/28  Now on heparin GGT             -antiplatelet therapy: N/A 3. Pain Management chronic back and left hip pain: Neurontin 100 mg on  hold  TLSO back brace for comfort  Lidocaine patch added to coccyx 4. Mood: Provide emotional support             -antipsychotic agents: N/A 5. Neuropsych: This patient is not capable of making decisions on her own behalf. 6. Skin/Wound Care: Routine skin checks  Santyl to sacral wound 7. Fluids/Electrolytes/Nutrition: Routine in and outs 8.  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  New HD tunneled catheter placed 10/26/2019. 9.  ID/discitis/recurrent Pseudomonas bacteremia.    Cipro/Maxipime changed to ceftazidime/Cipro.  Seen by neurosurgery on 12/25, no surgical intervention recommended at this point, however recommended reconsulting ID  Appreciate ID recs  10.  Hemodialysis MWF.  Follow-up per renal services.  11.  Diabetes mellitus.  Hemoglobin A1c 7.6.  SSI. CBG (last 3)  Recent Labs    11/19/19 1626 11/19/19 2137 11/20/19 0632  GLUCAP 147* 84 88   Labile on 12/28 12.  Hypertension.  Imdur 30 mg daily  Coreg 6.25 mg twice daily, increased to 12.5 on 12/15,  parameters placed.    Monitor with increased mobility.  Labile on 12/28 13.  Acute on chronic anemia.    Hemoglobin 8.4 on 12/25  Continue to monitor 14. Constipation:  See #16  Abd xray showing?  Volvulus  CT abdomen/pelvis showing possible Ogilvie's 15.  Candidiasis  Diflucan ordered on 12/16  Repeat Ucx not collected yet on 12/28 16.  Hyperammonemia with hepatic encephalopathy  Lactulose started on 12/17, encourage compliance  Ammonia WNL on 12/22 17. Sleep disturance  Melatonin added on 12/17  Sleep chart ordered  Improving 18.  Leukocytosis  WBCs 10.9 on 12/27, labs with HD  Afebrile  Continue to monitor 19.  GERD  Home Protonix ordered on 12/20 20.  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  Appreciate surgery and GI following  ?  Plan for endoscopy per GI   LOS: 19 days A FACE TO FACE EVALUATION WAS  PERFORMED  Gaylyn Berish Lorie Phenix 11/20/2019, 10:21 AM

## 2019-11-20 NOTE — Plan of Care (Signed)
  Problem: Consults Goal: RH STROKE PATIENT EDUCATION Description: See Patient Education module for education specifics  Outcome: Progressing Goal: Nutrition Consult-if indicated Outcome: Progressing Goal: Diabetes Guidelines if Diabetic/Glucose > 140 Description: If diabetic or lab glucose is > 140 mg/dl - Initiate Diabetes/Hyperglycemia Guidelines & Document Interventions  Outcome: Progressing   Problem: RH BOWEL ELIMINATION Goal: RH STG MANAGE BOWEL WITH ASSISTANCE Description: STG Manage Bowel with mod I/min Assistance. Outcome: Progressing Goal: RH STG MANAGE BOWEL W/MEDICATION W/ASSISTANCE Description: STG Manage Bowel with Medication with mod I Assistance. Outcome: Progressing   Problem: RH SKIN INTEGRITY Goal: RH STG SKIN FREE OF INFECTION/BREAKDOWN Description: Patients skin will remain free from further infection or breakdown with min assist. Outcome: Progressing Goal: RH STG MAINTAIN SKIN INTEGRITY WITH ASSISTANCE Description: STG Maintain Skin Integrity With min Assistance. Outcome: Progressing   Problem: RH SAFETY Goal: RH STG ADHERE TO SAFETY PRECAUTIONS W/ASSISTANCE/DEVICE Description: STG Adhere to Safety Precautions With supervision/min Assistance/Device. Outcome: Progressing Goal: RH STG DECREASED RISK OF FALL WITH ASSISTANCE Description: STG Decreased Risk of Fall With supervision/min Assistance. Outcome: Progressing   Problem: RH PAIN MANAGEMENT Goal: RH STG PAIN MANAGED AT OR BELOW PT'S PAIN GOAL Description: < 5 Outcome: Progressing   Problem: RH KNOWLEDGE DEFICIT Goal: RH STG INCREASE KNOWLEDGE OF DIABETES Description: Patient/caregiver will verbalize understanding of DM including diet, exercise, medications, monitoring, and follow up care with min assist. Outcome: Progressing Goal: RH STG INCREASE KNOWLEDGE OF HYPERTENSION Description: Patient/caregiver will verbalize understanding of HTN including diet, exercise, medications, monitoring, and follow  up care with min assist. Outcome: Progressing Goal: RH STG INCREASE KNOWLEGDE OF HYPERLIPIDEMIA Description: Patient/caregiver will verbalize understanding of HLD including diet, exercise, medications, monitoring, and follow up care with min assist. Outcome: Progressing Goal: RH STG INCREASE KNOWLEDGE OF STROKE PROPHYLAXIS Description: Patient/caregiver will verbalize understanding of stroke prophylaxis including diet, exercise, medications, monitoring, and follow up care with min assist. Outcome: Progressing

## 2019-11-20 NOTE — Progress Notes (Signed)
Social Work Patient ID: [name], female   DOB: 1951/07/30, 68 y.o.   MRN: [id number]  Met with pt she reports feeling somewhat better and son is here and aware of medical issues of mom. He does talk with the MD's and wants to be kept informed on Moms' condition. Aware has missed rehab sessions due to medical issues and will need to re-group once able to fully participate in therpaies before setting target discharge date, maybe at next team conference.

## 2019-11-20 NOTE — Progress Notes (Signed)
INFECTIOUS DISEASE PROGRESS NOTE  ID: [name] is a 68 y.o. female with  Principal Problem:   Cerebrovascular accident (CVA) of right basal ganglia (HCC) Active Problems:   Slow transit constipation   Acute on chronic anemia   Labile blood pressure   Labile blood glucose   Diabetes mellitus type 2 in nonobese (HCC)   Bacteremia   Endocarditis of mitral valve   Septic embolism (HCC)   Seizure (HCC)   Candidiasis   Encephalopathy, hepatic (HCC)   Hyperammonemia (HCC)   Sleep disturbance   Hemodialysis-associated hypotension   Supratherapeutic INR   Gastroesophageal reflux disease   Decubitus ulcer of sacral region, unstageable ([place])   Ogilvie's syndrome  Subjective: Pt in echo lab  Abtx:  Anti-infectives (From admission, onward)   Start     Dose/Rate Route Frequency Ordered Stop   11/18/19 1700  ciprofloxacin (CIPRO) tablet 500 mg     500 mg Oral Every 24 hours 11/18/19 1619     11/17/19 2000  cefTAZidime (FORTAZ) 1 g in sodium chloride 0.9 % 100 mL IVPB     1 g 200 mL/hr over 30 Minutes Intravenous Every 24 hours 11/17/19 1452     11/08/19 1400  fluconazole (DIFLUCAN) tablet 100 mg  Status:  Discontinued     100 mg Oral Every 24 hours 11/08/19 1059 11/09/19 1101   11/01/19 2000  ceFEPIme (MAXIPIME) 1 g in sodium chloride 0.9 % 100 mL IVPB  Status:  Discontinued     1 g 200 mL/hr over 30 Minutes Intravenous Every 24 hours 11/01/19 1715 11/17/19 1422   11/01/19 1800  ciprofloxacin (CIPRO) tablet 500 mg  Status:  Discontinued     500 mg Oral Daily-1800 11/01/19 1715 11/07/19 0914      Medications:  Scheduled: . acetaminophen  500 mg Oral TID  . calcium carbonate  1 tablet Oral 2 times per day  . carvedilol  12.5 mg Oral BID  . Chlorhexidine Gluconate Cloth  6 each Topical Q0600  . ciprofloxacin  500 mg Oral Q24H  . darbepoetin (ARANESP) injection - DIALYSIS  100 mcg Intravenous Q Mon-HD  . doxercalciferol  1.5 mcg Intravenous Q M,W,F-HD  . feeding  supplement (ENSURE ENLIVE)  237 mL Oral BID BM  . insulin aspart  0-9 Units Subcutaneous TID WC  . isosorbide mononitrate  30 mg Oral QPC lunch  . lactulose  20 g Oral TID  . lidocaine  1 patch Transdermal Q24H  . Melatonin  3 mg Oral QHS  . multivitamin  1 tablet Oral QPC lunch  . pantoprazole  40 mg Oral Daily  . Warfarin - Pharmacist Dosing Inpatient   Does not apply q1800    Objective: Vital signs in last 24 hours: Temp:  [98.8 F (37.1 C)-98.9 F (37.2 C)] 98.9 F (37.2 C) (12/28 0528) Pulse Rate:  [81-85] 84 (12/28 0528) Resp:  [16] 16 (12/28 0528) BP: (106-150)/(58-88) 150/88 (12/28 0528) SpO2:  [100 %] 100 % (12/28 0528) Weight:  [59 kg] 59 kg (12/28 0528)   no exam, pt in echo lab.   Lab Results Recent Labs    11/18/19 1247 11/19/19 1737  WBC 8.7 10.9*  HGB 7.6* 8.1*  HCT 24.9* 23.1*  NA 134*  --   K 3.9  --   CL 97*  --   CO2 22  --   BUN 43*  --   CREATININE 7.58*  --    Liver Panel Recent Labs    11/18/19 1247  ALBUMIN 1.7*   Sedimentation Rate No results for input(s): ESRSEDRATE in the last 72 hours. C-Reactive Protein No results for input(s): CRP in the last 72 hours.  Microbiology: No results found for this or any previous visit (from the past 240 hour(s)).  Studies/Results: No results found.   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  No indication of ADR from cipro being restarted.  Defer IR aspirate of spine unless clinical change.  Continue anbx to above listed stop date.  Available as needed.          Bobby Rumpf MD, FACP Infectious Diseases (pager) [phone] [email or web address] 11/20/2019, 10:30 AM  LOS: 19 days

## 2019-11-20 NOTE — Progress Notes (Signed)
Physical Therapy Session Note  Patient Details  Name: [name] MRN: [id number] Date of Birth: May 21, 1951  Today's Date: 11/20/2019 PT Individual Time: [id number] PT Individual Time Calculation (min): 30 min   Short Term Goals: Week 3:  PT Short Term Goal 1 (Week 3): Pt will transfer supine<>sit and sit<>supine with min A PT Short Term Goal 2 (Week 3): Pt will transfer stand<>pivot with LRAD mod A PT Short Term Goal 3 (Week 3): Pt will perform car transfer with LRAD mod A  Skilled Therapeutic Interventions/Progress Updates:     Patient with Bed Rest orders, per Pam, PA, patient cleared for bed level exercises. Patient in bed with NT in room upon PT arrival. Patient alert and agreeable to PT session. Patient reported that she needed to have a BM at beginning of session. Focused session on bed mobility and core and LE strengthening at bed level.   Therapeutic Activity: Patient performed rolling R and L x5 first with the rail and progressing to no rail with cues for bending opposite LE and pushing to roll and core activation with turning head and shoulders. PT placed bed pan, performed peri-care and doffed/donned incontinences brief with total A for toileting. Patient was continent of bowl on the bed pan, RN made aware, see flowsheet for details. Patient's sacral dressing was soiled and removed during peri-care, RN made aware. Educated patient on strategies for tolerating HD, as she said that she "dreads" going every time. Patient performed bridging 2x5 with manual facilitation at knees x2 progressing to no assist for LE strengthening and educated on using this exercise in the bed for pressure relief and to assist with bed level bathing/dressing.   Patient in bed with RN in room at end of session with breaks locked, bed alarm set, and all needs within reach.    Therapy Documentation Precautions:  Precautions Precautions: Fall Precaution Comments: Strong L lateral lean, TLSO for  mobility Required Braces or Orthoses: Other Brace(TLSO) Spinal Brace: Thoracolumbosacral orthotic Spinal Brace Comments: for comfort with mobility Restrictions Weight Bearing Restrictions: No    Therapy/Group: Individual Therapy  Genora Arp L [place] PT, DPT  11/20/2019, 12:38 PM

## 2019-11-20 NOTE — Progress Notes (Signed)
PROGRESS NOTE    [name]  [id number] DOB: 1950-12-11 DOA: 11/01/2019 PCP: Manon Hilding, MD   Brief Narrative: [name] is a 68 y.o. female with a history of ESRD on HD, mitral valve endocarditis, hepatic encephalopathy, chronic anemia, diabetes mellitus, CVA. Patient is currently admitted to inpatient rehab for post-CVA rehabilitation and was found to have worsening mentation.   Assessment & Plan:   Principal Problem:   Cerebrovascular accident (CVA) of right basal ganglia (HCC) Active Problems:   Slow transit constipation   Acute on chronic anemia   Labile blood pressure   Labile blood glucose   Diabetes mellitus type 2 in nonobese (HCC)   Bacteremia   Endocarditis of mitral valve   Septic embolism (HCC)   Seizure (HCC)   Candidiasis   Encephalopathy, hepatic (HCC)   Hyperammonemia (HCC)   Sleep disturbance   Hemodialysis-associated hypotension   Supratherapeutic INR   Gastroesophageal reflux disease   Decubitus ulcer of sacral region, unstageable ([place])   Ogilvie's syndrome   Encephalopathy Unknown etiology. Thought possibly secondary to delirium. Appears to be improved today but just somnolent possibly secondary to Seroquel overnight. Resolved.  Diabetes mellitus, type 2 Patient is on insulin NPH-regular (70-30) 4 units BID WC as an outpatient. Currently on SSI. Blood sugar controlled. -Continue SSI  Hyperammonemia History of hepatic encephalopathy -Continue lactulose  Ogilvie's syndrome Initially with symptoms on 12/23. Abdominal X-ray concerning for volvulus and CT scan significant for evidence of pneumatosis coli. General surgery consulted and assessment includes likely Olgivie's syndrome. Abdominal pain is improved from early but she still is tender in the RUQ. -General surgery recommendations: GI consult -GI recommendations: pending  Discitis L3-L4. In setting of bacterial endocarditis. Patient does have a history of sacral decubitus  ulcer. Afebrile and normal WBC. Pelvic x-ray not suggestive of fracture. MRI obtained which shows concern for infectious process. Neurosurgery consulted and their concern is discitis; recommended ID re-consult. Patient has been afebrile and WBC is normal today. Patient currently on Ceftazidime and Ciprofloxacin -Per ID  Coagulopathy Secondary to Coumadin. INR appears to have reached plateau . No obvious source of bleeding. Hemoglobin stable. Given 1 mg of vitamin K with reversal of INR down to 2.1. Unsure of etiology but this could possibly be secondary to vitamin K deficiency.  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 Evaluated by neurology with LT EEG. Gabapentin discontinued. No reported recurrences.   Right IJ DVT Acute from hospitalization in November. Started on Coumadin. -Coumadin -Start heparin for bridge in setting of subtherapeutic INR and recent vitamin K administration  Mitral valve endocarditis Seen by cardiothoracic surgery on medical admission. No plan for replacement valve at this acute period but will need follow-up as an outpatient. Was on Cefepime/cipro, transitioned to Cefepime monotherapy and now is on Ceftazidime and Cipro -Per ID -TCTS: Rechecking Transthoracic Echocardiogram   Essential hypertension -Continue Imdur  CVA Recently hospitalized and is now undergoing inpatient rehabilitation. Per stroke team, does not appear aspirin was considered for continuation on discharge. -Continue Coumadin  Pressure injury Medial sacrum, not POA    DVT prophylaxis: Coumadin (currently held secondary to supertherapeutic INR) Code Status:   Code Status: DNR Family Communication: None at bedside   Procedures:   HD  Antimicrobials:  Cefepime  Ceftazidime   Subjective: Back pain improving. Had an episode of emesis overnight with associated nausea. Patient feels better this morning. No worsened abdominal pain.  Objective: Vitals:   11/19/19 1341  11/19/19 2049 11/20/19 0525 11/20/19 [id number]  BP: 118/69 (!) 106/58 (!) 150/88 (!) 150/88  Pulse: 81 81 85 84  Resp: 16 16 16 16   Temp: 98.8 F (37.1 C) 98.9 F (37.2 C) 98.9 F (37.2 C) 98.9 F (37.2 C)  TempSrc:      SpO2: 100% 100% 100% 100%  Weight:    59 kg  Height:        Intake/Output Summary (Last 24 hours) at 11/20/2019 0956 Last data filed at 11/20/2019 0904 Gross per 24 hour  Intake 772.59 ml  Output --  Net 772.59 ml   Filed Weights   11/15/19 1318 11/15/19 1659 11/20/19 0528  Weight: 60 kg 59.2 kg 59 kg    Examination:  General exam: Appears calm and comfortable Respiratory system: Clear to auscultation. Respiratory effort normal. Cardiovascular system: S1 & S2 heard, RRR. 2/6 systolic murmur. Gastrointestinal system: Abdomen is nondistended, soft and nontender. No organomegaly or masses felt. Normal bowel sounds heard. Central nervous system: Alert and oriented. No focal neurological deficits. Extremities: No edema. No calf tenderness Skin: No cyanosis. No rashes Psychiatry: Judgement and insight appear normal. Mood & affect appropriate.     Data Reviewed: I have personally reviewed following labs and imaging studies  CBC: Recent Labs  Lab 11/13/19 1134 11/15/19 1036 11/17/19 0859 11/18/19 1247 11/19/19 1737  WBC 12.6* 12.6* 9.1 8.7 10.9*  HGB 9.1* 8.5* 8.4* 7.6* 8.1*  HCT 28.4* 26.3* 27.1* 24.9* 23.1*  MCV 91.0 88.9 91.2 92.2 89.5  PLT 199 241 274 297 [id number]*   Basic Metabolic Panel: Recent Labs  Lab 11/13/19 1248 11/15/19 1036 11/17/19 0859 11/18/19 1247  NA 131* 135 136 134*  K 4.4 3.8 3.9 3.9  CL 94* 96* 96* 97*  CO2 22 22 23 22   GLUCOSE 198* 138* 150* 119*  BUN 57* 35* 30* 43*  CREATININE 8.41* 6.38* 5.67* 7.58*  CALCIUM 8.3* 8.6* 8.5* 8.2*  PHOS 5.2*  --   --  4.2   GFR: Estimated Creatinine Clearance: 6.5 mL/min (A) (by C-G formula based on SCr of 7.58 mg/dL (H)). Liver Function Tests: Recent Labs  Lab 11/13/19 1248  11/15/19 1036 11/18/19 1247  AST  --  20  --   ALT  --  57*  --   ALKPHOS  --  111  --   BILITOT  --  0.4  --   PROT  --  6.6  --   ALBUMIN 1.7* 2.0* 1.7*   No results for input(s): LIPASE, AMYLASE in the last 168 hours. Recent Labs  Lab 11/14/19 0522 11/15/19 1036  AMMONIA 25 33   Coagulation Profile: Recent Labs  Lab 11/15/19 0718 11/16/19 0558 11/17/19 0504 11/18/19 0717 11/19/19 0637  INR 5.6* 5.2* 5.8* 2.1* 1.5*   Cardiac Enzymes: No results for input(s): CKTOTAL, CKMB, CKMBINDEX, TROPONINI in the last 168 hours. BNP (last 3 results) No results for input(s): PROBNP in the last 8760 hours. HbA1C: No results for input(s): HGBA1C in the last 72 hours. CBG: Recent Labs  Lab 11/19/19 0638 11/19/19 1136 11/19/19 1626 11/19/19 2137 11/20/19 0632  GLUCAP 103* 114* 147* 84 88   Lipid Profile: No results for input(s): CHOL, HDL, LDLCALC, TRIG, CHOLHDL, LDLDIRECT in the last 72 hours. Thyroid Function Tests: No results for input(s): TSH, T4TOTAL, FREET4, T3FREE, THYROIDAB in the last 72 hours. Anemia Panel: No results for input(s): VITAMINB12, FOLATE, FERRITIN, TIBC, IRON, RETICCTPCT in the last 72 hours. Sepsis Labs: No results for input(s): PROCALCITON, LATICACIDVEN in the last 168 hours.  No results found  for this or any previous visit (from the past 240 hour(s)).       Radiology Studies: No results found.      Scheduled Meds: . acetaminophen  500 mg Oral TID  . calcium carbonate  1 tablet Oral 2 times per day  . carvedilol  12.5 mg Oral BID  . Chlorhexidine Gluconate Cloth  6 each Topical Q0600  . ciprofloxacin  500 mg Oral Q24H  . darbepoetin (ARANESP) injection - DIALYSIS  100 mcg Intravenous Q Mon-HD  . doxercalciferol  1.5 mcg Intravenous Q M,W,F-HD  . feeding supplement (ENSURE ENLIVE)  237 mL Oral BID BM  . insulin aspart  0-9 Units Subcutaneous TID WC  . isosorbide mononitrate  30 mg Oral QPC lunch  . lactulose  20 g Oral TID  .  lidocaine  1 patch Transdermal Q24H  . Melatonin  3 mg Oral QHS  . multivitamin  1 tablet Oral QPC lunch  . pantoprazole  40 mg Oral Daily  . Warfarin - Pharmacist Dosing Inpatient   Does not apply q1800   Continuous Infusions: . sodium chloride    . sodium chloride    . sodium chloride    . sodium chloride    . cefTAZidime (FORTAZ)  IV Stopped (11/19/19 2130)  . ferric gluconate (FERRLECIT/NULECIT) IV    . heparin 800 Units/hr (11/20/19 0700)     LOS: 19 days     Cordelia Poche, MD [facility] 11/20/2019, 9:56 AM  If 7PM-7AM, please contact night-coverage [email or web address]

## 2019-11-20 NOTE — Progress Notes (Signed)
Occupational Therapy Session Note  Patient Details  Name: Sumer Brillhart MRN: [id number] Date of Birth: [birth year]  Today's Date: 11/20/2019 OT Individual Time:  -       Short Term Goals: Week 3:  OT Short Term Goal 1 (Week 3): Pt will perform shower transfer with mod A. OT Short Term Goal 2 (Week 3): Pt will perform toileting with mod A. OT Short Term Goal 3 (Week 3): Pt will complete LB dressing with mod assist OT Short Term Goal 4 (Week 3): Pt will tolerate sitting OOB 2 hrs to demonstrate increased activity tolerance  Skilled Therapeutic Interventions/Progress Updates:    patient continues to be on bed rest at this time, missed 60 minutes of therapy due to medical   Therapy Documentation Precautions:  Precautions Precautions: Fall Precaution Comments: Strong L lateral lean, TLSO for mobility Required Braces or Orthoses: Other Brace(TLSO) Spinal Brace: Thoracolumbosacral orthotic Spinal Brace Comments: for comfort with mobility Restrictions Weight Bearing Restrictions: No General: General OT Amount of Missed Time: 60 Minutes Vital Signs: Therapy Vitals Temp: 98.9 F (37.2 C) Pulse Rate: 84 Resp: 16 BP: (!) 150/88 Patient Position (if appropriate): Lying Oxygen Therapy SpO2: 100 % O2 Device: Room Air Pain:    Bonnita Levan Triston Skare 11/20/2019, 8:50 AM

## 2019-11-20 NOTE — Procedures (Signed)
I was present at this dialysis session. I have reviewed the session itself and made appropriate changes.   0.5L UF goal. 3K bath.  Tol well.    Filed Weights   11/15/19 1659 11/20/19 0528 11/20/19 1256  Weight: 59.2 kg 59 kg 56.7 kg    Recent Labs  Lab 11/20/19 1314  NA 136  K 4.0  CL 99  CO2 24  GLUCOSE 102*  BUN 28*  CREATININE 6.32*  CALCIUM 8.1*  PHOS 3.6    Recent Labs  Lab 11/18/19 1247 11/19/19 1737 11/20/19 1100  WBC 8.7 10.9* 11.1*  HGB 7.6* 8.1* 8.6*  HCT 24.9* 23.1* 28.3*  MCV 92.2 89.5 92.8  PLT 297 543* 247    Scheduled Meds: . acetaminophen  500 mg Oral TID  . calcium carbonate  1 tablet Oral 2 times per day  . carvedilol  12.5 mg Oral BID  . Chlorhexidine Gluconate Cloth  6 each Topical Q0600  . ciprofloxacin  500 mg Oral Q24H  . darbepoetin (ARANESP) injection - DIALYSIS  100 mcg Intravenous Q Mon-HD  . doxercalciferol  1.5 mcg Intravenous Q M,W,F-HD  . feeding supplement (ENSURE ENLIVE)  237 mL Oral BID BM  . insulin aspart  0-9 Units Subcutaneous TID WC  . isosorbide mononitrate  30 mg Oral QPC lunch  . lactulose  20 g Oral TID  . lidocaine  1 patch Transdermal Q24H  . Melatonin  3 mg Oral QHS  . multivitamin  1 tablet Oral QPC lunch  . pantoprazole  40 mg Oral Daily  . warfarin  0.5 mg Oral ONCE-1800  . Warfarin - Pharmacist Dosing Inpatient   Does not apply q1800   Continuous Infusions: . [START ON 11/21/2019] sodium chloride    . [START ON 11/21/2019] sodium chloride    . cefTAZidime (FORTAZ)  IV Stopped (11/19/19 2130)  . ferric gluconate (FERRLECIT/NULECIT) IV     PRN Meds:.[START ON 11/21/2019] sodium chloride, [START ON 11/21/2019] sodium chloride, acetaminophen **OR** acetaminophen (TYLENOL) oral liquid 160 mg/5 mL **OR** acetaminophen, alteplase, fluticasone, [START ON 11/21/2019] heparin, [START ON 11/21/2019] heparin, lidocaine (PF), lidocaine-prilocaine, meclizine, ondansetron, oxyCODONE, pentafluoroprop-tetrafluoroeth,  sorbitol   Pearson Grippe  MD 11/20/2019, 2:43 PM

## 2019-11-20 NOTE — Progress Notes (Addendum)
  Subjective: Patient will need repeat transthoracic echo before discharge to assess the mitral valve structure and function with hx of pseudomonas endocarditis  Objective: Vital signs in last 24 hours: Temp:  [98.8 F (37.1 C)-98.9 F (37.2 C)] 98.9 F (37.2 C) (12/28 0528) Pulse Rate:  [80-85] 84 (12/28 0528) Resp:  [16] 16 (12/28 0528) BP: (106-150)/(58-88) 150/88 (12/28 0528) SpO2:  [100 %] 100 % (12/28 0528) Weight:  [59 kg] 59 kg (12/28 0528)  Hemodynamic parameters for last 24 hours:    Intake/Output from previous day: 12/27 0701 - 12/28 0700 In: 770.6 [P.O.:368; I.V.:202.6; IV Piggyback:200] Out: -  Intake/Output this shift: No intake/output data recorded.    Lab Results: Recent Labs    11/18/19 1247 11/19/19 1737  WBC 8.7 10.9*  HGB 7.6* 8.1*  HCT 24.9* 23.1*  PLT 297 543*   BMET:  Recent Labs    11/17/19 0859 11/18/19 1247  NA 136 134*  K 3.9 3.9  CL 96* 97*  CO2 23 22  GLUCOSE 150* 119*  BUN 30* 43*  CREATININE 5.67* 7.58*  CALCIUM 8.5* 8.2*    PT/INR:  Recent Labs    11/19/19 0637  LABPROT 18.4*  INR 1.5*   ABG    Component Value Date/Time   TCO2 27 10/27/2018 0619   CBG (last 3)  Recent Labs    11/19/19 1626 11/19/19 2137 11/20/19 0632  GLUCAP 147* 84 88    Assessment/Plan: S/P  followup transthoracic echo ordered to assess mitral valve endocarditis prior to discharge   LOS: 19 days    Tharon Aquas Trigt III   I have reviewed the patient's transthoracic echocardiogram. The mitral valve vegetation appears to have improved since the initial study 4 weeks ago.  Mitral regurgitation appears to be somewhat greater that at baseline but remains moderate. I would not recommend mitral valve replacement at this point because of the patient's well-preserved LV function and lack of symptoms of HF.  With her chronic renal disease on dialysis, chronic hepatic disease and chronic GI issues-Ogilvie syndrome -  her operative risk would  preclude any beneficial outcome from surgery.  I will follow the patient in the office after discharge.  I would recommend that she have a cardiology follow-up appointment made as well.  Dahlia Byes MD

## 2019-11-20 NOTE — Progress Notes (Signed)
  Echocardiogram 2D Echocardiogram has been performed.  [name] 11/20/2019, 10:42 AM

## 2019-11-20 NOTE — Progress Notes (Signed)
Speech Language Pathology Daily Session Note  Patient Details  Name: [name] MRN: [id number] Date of Birth: Feb 03, 1951  Today's Date: 11/20/2019 SLP Individual Time: [id number] SLP Individual Time Calculation (min): 44 min  Short Term Goals: Week 3: SLP Short Term Goal 1 (Week 3): Pt will demonstrate functional problem solving during basic tasks with Mod A verbal/visual cues SLP Short Term Goal 2 (Week 3): Pt will demonstrate recall of day to day/new information with Mod A verbal cues for use of compensatory memory strategies SLP Short Term Goal 3 (Week 3): Pt will sustain attention to functional tasks with Min A verbal cues for redirection SLP Short Term Goal 4 (Week 3): Pt will detect and correct errors during functional tasks with Mod A verbal/visual cues. SLP Short Term Goal 5 (Week 3): Pt will initiate during functional tasks with Mod A verbal/visual cues from clinician.  Skilled Therapeutic Interventions: Pt was seen for skilled ST targeting cognitive goals. Pt demonstrated exceptional improvements in initiation and basic problem solving this morning. She maintained alertness and eyes open, independently and made appropriate requests to donn dentures, get glasses, etc in preparation to eat breakfast. Pt sustained attention to tasks with Min-Mod A verbal cues for redirection, however sacral pain noted to influence pt's ability to sustain attention today. Suspect once pain level decreases she will be able to fully participate and even greater improvements in attention will be seen. Pt expressed eagerness to participate in therapy today despite pain level. NT aware of pt's sacral pain and description of feeling "hot and itchy." SLP further facilitated session with Min A verbal cues for error awareness and problem solving during a basic 3-step action card sequencing task. Pt left laying in bed with alarm set and needs within reach. Continue per current plan of care.         Pain Pain  Assessment Pain Scale: Faces Faces Pain Scale: Hurts little more Pain Type: Acute pain Pain Location: Sacrum Pain Orientation: Medial;Lower Pain Descriptors / Indicators: Aching(also "hot and itchy" per pt report) Pain Onset: On-going Pain Intervention(s): RN made aware Multiple Pain Sites: No  Therapy/Group: Individual Therapy  [name] 11/20/2019, 8:09 AM

## 2019-11-20 NOTE — Progress Notes (Signed)
[facility] Progress Note  Subjective: The patient is seen in follow-up today.  She had some nausea with vomiting last evening.  She is tolerating food and she had some bowel movements last evening.  No report of blood.  Her recent CT scan showed a mildly distended ascending colon with wall thickening and evidence of pneumatosis coli.  There was no free air.  Surgery has seen her and does not see an indication for surgery at this time.  Objective: Vital signs in last 24 hours: Temp:  [98.8 F (37.1 C)-98.9 F (37.2 C)] 98.9 F (37.2 C) (12/28 0528) Pulse Rate:  [81-85] 84 (12/28 0528) Resp:  [16] 16 (12/28 0528) BP: (106-150)/(58-88) 150/88 (12/28 0528) SpO2:  [100 %] 100 % (12/28 0528) Weight:  [59 kg] 59 kg (12/28 0528) Weight change:    PE:  No distress  Heart regular rhythm  Abdomen: Bowel sounds are present, soft, mild tenderness on the right side  Lab Results: Results for orders placed or performed during the hospital encounter of 11/01/19 (from the past 24 hour(s))  Glucose, capillary     Status: Abnormal   Collection Time: 11/19/19 11:36 AM  Result Value Ref Range   Glucose-Capillary 114 (H) 70 - 99 mg/dL  Glucose, capillary     Status: Abnormal   Collection Time: 11/19/19  4:26 PM  Result Value Ref Range   Glucose-Capillary 147 (H) 70 - 99 mg/dL  Heparin level (unfractionated)     Status: Abnormal   Collection Time: 11/19/19  5:37 PM  Result Value Ref Range   Heparin Unfractionated 1.03 (H) 0.30 - 0.70 IU/mL  CBC     Status: Abnormal   Collection Time: 11/19/19  5:37 PM  Result Value Ref Range   WBC 10.9 (H) 4.0 - 10.5 K/uL   RBC 2.58 (L) 3.87 - 5.11 MIL/uL   Hemoglobin 8.1 (L) 12.0 - 15.0 g/dL   HCT 23.1 (L) 36.0 - 46.0 %   MCV 89.5 80.0 - 100.0 fL   MCH 31.4 26.0 - 34.0 pg   MCHC 35.1 30.0 - 36.0 g/dL   RDW 18.9 (H) 11.5 - 15.5 %   Platelets 543 (H) 150 - 400 K/uL   nRBC 0.0 0.0 - 0.2 %  Glucose, capillary     Status: None   Collection  Time: 11/19/19  9:37 PM  Result Value Ref Range   Glucose-Capillary 84 70 - 99 mg/dL   Comment 1 Notify RN   Glucose, capillary     Status: None   Collection Time: 11/20/19  6:32 AM  Result Value Ref Range   Glucose-Capillary 88 70 - 99 mg/dL   Comment 1 Notify RN     Studies/Results: ECHOCARDIOGRAM LIMITED  Result Date: 11/20/2019   ECHOCARDIOGRAM LIMITED REPORT   Patient Name:   [name] Date of Exam: 11/20/2019 Medical Rec #:  [id number]        Height:       67.0 in Accession #:    [id number]       Weight:       130.0 lb Date of Birth:  03-Mar-1951       BSA:          1.68 m Patient Age:    68 years         BP:           150/88 mmHg Patient Gender: F                HR:  82 bpm. Exam Location:  Inpatient  Procedure: Limited Echo, 3D Echo, Cardiac Doppler and Color Doppler Indications:    Endocarditis of mitral valve.  History:        Patient has prior history of Echocardiogram examinations, most                 recent 10/26/2019. ESRD. Endocarditis of the anterior leaflet of                 the mitral valve.  Sonographer:    Roseanna Rainbow RDCS Referring Phys: [place] TRIGT  Sonographer Comments: Technically difficult study due to poor echo windows. IMPRESSIONS  1. Left ventricular ejection fraction, by visual estimation, is 55 to 60%. The left ventricle has normal function. Left ventricular septal wall thickness was severely increased. Moderately increased left ventricular posterior wall thickness.  2. Elevated left atrial and left ventricular end-diastolic pressures.  3. Left ventricular diastolic parameters are consistent with Grade I diastolic dysfunction (impaired relaxation).  4. Moderate thickening of the anterior mitral valve leaflet(s).  5. Moderate vegetation on the mitral valve.  6. The mitral valve is abnormal. Moderate to severe mitral valve regurgitation.  7. Tricuspid valve regurgitation moderate.  8. Tricuspid valve regurgitation moderate.  9. Moderate thickening.  10. Normal pulmonary artery systolic pressure. 11. A prior study was performed on 10/26/2019. 12. Changes from prior study are noted. 13. Compared to the prior TEE, moderate AMVL endocarditis persists with a mobile mass noted on the anterior leaflet ~0.5 cm in diameter, may be vegetation or ruptured apparatus. There is moderate to severe, posteriorly directed MR. Clinical correlation with CHF symptoms is recommended. FINDINGS  Left Ventricle: Left ventricular ejection fraction, by visual estimation, is 55 to 60%. The left ventricle has normal function. Left ventricular septal wall thickness was severely increased. Moderately increased left ventricular posterior wall thickness. Left ventricular diastolic parameters are consistent with Grade I diastolic dysfunction (impaired relaxation). Elevated left atrial and left ventricular end-diastolic pressures. Right Ventricle: The tricuspid regurgitant velocity is 2.16 m/s, and with an assumed right atrial pressure of 3 mmHg, the estimated right ventricular systolic pressure is normal at 21.7 mmHg. Mitral Valve: The mitral valve is abnormal. There is moderate thickening of the anterior mitral valve leaflet(s). A moderate vegetation is seen on the anterior mitral leaflet. The MV vegetation measures 5 mm x 5 mm. MV Area by PHT, 3.99 cm. MV PHT, 55.10 msec. Moderate to severe mitral valve regurgitation, with posteriorly-directed jet. Tricuspid Valve: Tricuspid valve regurgitation moderate. Aortic Valve: The aortic valve is tricuspid. Moderate thickening. Venous: The inferior vena cava was not well visualized. Additional Comments: A prior study was performed on 10/26/2019.  LEFT VENTRICLE          Normals PLAX 2D LVIDd:         2.80 cm  3.6 cm LVIDs:         2.10 cm  1.7 cm LV PW:         1.60 cm  1.4 cm LV IVS:        1.60 cm  1.3 cm LVOT diam:     1.80 cm  2.0 cm LV SV:         15 ml    79 ml LV SV Index:   9.09     45 ml/m2 LVOT Area:     2.54 cm 3.14 cm2  LV Volumes (MOD)              Normals LV  area d, A4C:    18.70 cm LV area s, A4C:    10.20 cm LV major d, A4C:   6.12 cm LV major s, A4C:   4.90 cm LV vol d, MOD A4C: 45.5 ml LV vol s, MOD A4C: 17.7 ml LV SV MOD A4C:     45.5 ml LEFT ATRIUM         Index LA diam:    4.00 cm 2.38 cm/m  AORTIC VALVE             Normals LVOT Vmax:   110.00 cm/s LVOT Vmean:  79.400 cm/s 75 cm/s LVOT VTI:    0.220 m     25.3 cm  AORTA                 Normals Ao Root diam: 3.00 cm 31 mm MITRAL VALVE              Normals   TRICUSPID VALVE             Normals MV Area (PHT): 3.99 cm             TR Peak grad:   18.7 mmHg MV PHT:        55.10 msec 55 ms     TR Vmax:        216.00 cm/s 288 cm/s MV Decel Time: 190 msec   187 ms MV E velocity: 85.30 cm/s 103 cm/s  SHUNTS MV A velocity: 99.50 cm/s 70.3 cm/s Systemic VTI:  0.22 m MV E/A ratio:  0.86       1.5       Systemic Diam: 1.80 cm  Lyman Bishop MD Electronically signed by Lyman Bishop MD Signature Date/Time: 11/20/2019/10:54:30 AMThe mitral valve is abnormal.    Final       Assessment: Pneumatosis coli.  This could be due to ischemia or to an infectious etiology.  The presence of pneumatosis coli signifies that there has been mucosal disruption.  In patients who are minimally symptomatic or asymptomatic the recommendations are to observe and repeat imaging in 1 to 3 months or to resolution.  I see no indication at this time for colonoscopy which since there is already mucosal disruption causing pneumatosis coli could actually make things worse.  Plan:   Continue conservative therapy    Cassell Clement 11/20/2019, 11:25 AM  Pager: [phone] If no answer or after 5 PM call [phone]

## 2019-11-20 NOTE — Progress Notes (Signed)
ANTICOAGULATION CONSULT NOTE - Follow-Up  Pharmacy Consult for Warfarin Indication: RIJ DVT (10/21/19), CVA (10/20/19)  No Known Allergies  Patient Measurements: Height: 5\' 7"  (170.2 cm) Weight: 130 lb (59 kg) IBW/kg (Calculated) : 61.6  Vital Signs: Temp: 98.9 F (37.2 C) (12/28 0528) BP: 150/88 (12/28 0528) Pulse Rate: 84 (12/28 0528)  Labs: Recent Labs    11/18/19 0717 11/18/19 1247 11/19/19 0637 11/19/19 1737 11/20/19 1100  HGB  --  7.6*  --  8.1* 8.6*  HCT  --  24.9*  --  23.1* 28.3*  PLT  --  297  --  543* 247  LABPROT 23.4*  --  18.4*  --  22.6*  INR 2.1*  --  1.5*  --  2.0*  HEPARINUNFRC  --   --   --  1.03*  --   CREATININE  --  7.58*  --   --   --     Estimated Creatinine Clearance: 6.5 mL/min (A) (by C-G formula based on SCr of 7.58 mg/dL (H)).  Assessment: 68 yr old female with ESRD who presented on 10/20/19 with new CVA and RIJ DVT 10/21/19. Pharmacy consulted to dose warfarin.   INR therapeutic at 2. Based on response to vitamin K 1mg , patient is very deficient in PO intake. Will dose warfarin very conservatively, especially with ciprofloxacin continuing until 12/06/19.   Heparin level 2.2, almost certainly contaminated with heparin. Drawing HL from same arm as infusion due to R side restrictions. INR is therapeutic, recommend DC heparin.    Goal of Therapy:  INR 2-3 Heparin Level: 0.3-0.7 Monitor platelets by anticoagulation protocol: Yes   Plan:  - OK to stop heparin per MD - Warfarin 0.5mg  x1 tonight (noted interaction with Cipro)  - F/u cipro interaction  - Daily PT/INR, CBC Q Mon - Monitor for signs/symptoms of bleeding  Benetta Spar, PharmD, BCPS, BCCP Clinical Pharmacist  Please check AMION for all [place] phone numbers After 10:00 PM, call [place]

## 2019-11-21 ENCOUNTER — Inpatient Hospital Stay (HOSPITAL_COMMUNITY): Payer: Medicare PPO

## 2019-11-21 ENCOUNTER — Inpatient Hospital Stay (HOSPITAL_COMMUNITY): Payer: Medicare PPO | Admitting: Occupational Therapy

## 2019-11-21 ENCOUNTER — Inpatient Hospital Stay (HOSPITAL_COMMUNITY): Payer: Medicare PPO | Admitting: Speech Pathology

## 2019-11-21 DIAGNOSIS — K6389 Other specified diseases of intestine: Secondary | ICD-10-CM

## 2019-11-21 LAB — PROTIME-INR
INR: 1.8 — ABNORMAL HIGH (ref 0.8–1.2)
Prothrombin Time: 20.6 seconds — ABNORMAL HIGH (ref 11.4–15.2)

## 2019-11-21 LAB — GLUCOSE, CAPILLARY
Glucose-Capillary: 95 mg/dL (ref 70–99)
Glucose-Capillary: 101 mg/dL — ABNORMAL HIGH (ref 70–99)
Glucose-Capillary: 159 mg/dL — ABNORMAL HIGH (ref 70–99)
Glucose-Capillary: 96 mg/dL (ref 70–99)

## 2019-11-21 MED ORDER — WARFARIN SODIUM 1 MG PO TABS
1.0000 mg | ORAL_TABLET | Freq: Once | ORAL | Status: AC
  Administered 2019-11-21: 1 mg via ORAL
  Filled 2019-11-21: qty 1

## 2019-11-21 NOTE — Progress Notes (Signed)
Speech Language Pathology Daily Session Note  Patient Details  Name: [name] MRN: [id number] Date of Birth: [birth year]  Today's Date: 11/21/2019 SLP Individual Time: 1430-1455 SLP Individual Time Calculation (min): 25 min  Short Term Goals: Week 3: SLP Short Term Goal 1 (Week 3): Pt will demonstrate functional problem solving during basic tasks with Mod A verbal/visual cues SLP Short Term Goal 2 (Week 3): Pt will demonstrate recall of day to day/new information with Mod A verbal cues for use of compensatory memory strategies SLP Short Term Goal 3 (Week 3): Pt will sustain attention to functional tasks with Min A verbal cues for redirection SLP Short Term Goal 4 (Week 3): Pt will detect and correct errors during functional tasks with Mod A verbal/visual cues. SLP Short Term Goal 5 (Week 3): Pt will initiate during functional tasks with Mod A verbal/visual cues from clinician.  Skilled Therapeutic Interventions: Skilled treatment session focused on cognitive goals. SLP facilitated session by providing overall Min A verbal cues for utilization of memory compensatory strategies during a novel, memory task. Patient utilized associations to maximize recall of novel information after an ~15 minute delay with overall extra time. Patient left supine in bed with alarm on and all needs within reach. Continue with current plan of care.      Pain No/Denies Pain   Therapy/Group: Individual Therapy  Johnathon Olden 11/21/2019, 3:13 PM

## 2019-11-21 NOTE — Progress Notes (Signed)
[facility] Progress Note  Subjective: No major ay.No major complaints today.  She is  is eating  Objective: Vital signs in last 24 hours: Temp:  [98.2 F (36.8 C)-98.7 F (37.1 C)] 98.6 F (37 C) (12/29 0553) Pulse Rate:  [80-91] 85 (12/29 0553) Resp:  [18-19] 18 (12/29 0553) BP: (132-156)/(68-95) 144/77 (12/29 0553) SpO2:  [100 %] 100 % (12/29 0553) Weight:  [56.2 kg] 56.2 kg (12/28 1636) Weight change: -2.268 kg   PE:    Abdomen soft  Lab Results: Results for orders placed or performed during the hospital encounter of 11/01/19 (from the past 24 hour(s))  Renal function panel     Status: Abnormal   Collection Time: 11/20/19  1:14 PM  Result Value Ref Range   Sodium 136 135 - 145 mmol/L   Potassium 4.0 3.5 - 5.1 mmol/L   Chloride 99 98 - 111 mmol/L   CO2 24 22 - 32 mmol/L   Glucose, Bld 102 (H) 70 - 99 mg/dL   BUN 28 (H) 8 - 23 mg/dL   Creatinine, Ser 6.32 (H) 0.44 - 1.00 mg/dL   Calcium 8.1 (L) 8.9 - 10.3 mg/dL   Phosphorus 3.6 2.5 - 4.6 mg/dL   Albumin 1.8 (L) 3.5 - 5.0 g/dL   GFR calc non Af Amer 6 (L) >60 mL/min   GFR calc Af Amer 7 (L) >60 mL/min   Anion gap 13 5 - 15  Glucose, capillary     Status: None   Collection Time: 11/20/19  5:11 PM  Result Value Ref Range   Glucose-Capillary 73 70 - 99 mg/dL  Glucose, capillary     Status: Abnormal   Collection Time: 11/20/19  9:16 PM  Result Value Ref Range   Glucose-Capillary 110 (H) 70 - 99 mg/dL  Protime-INR     Status: Abnormal   Collection Time: 11/21/19  6:02 AM  Result Value Ref Range   Prothrombin Time 20.6 (H) 11.4 - 15.2 seconds   INR 1.8 (H) 0.8 - 1.2  Glucose, capillary     Status: None   Collection Time: 11/21/19  6:21 AM  Result Value Ref Range   Glucose-Capillary 95 70 - 99 mg/dL  Glucose, capillary     Status: Abnormal   Collection Time: 11/21/19 12:23 PM  Result Value Ref Range   Glucose-Capillary 101 (H) 70 - 99 mg/dL    Studies/Results: No results  found.    Assessment: Pnematosis coli. Minimal symptoms.  Plan:   No GI intervention planned. Observe. We will sign off.    SAM F Kylan Liberati 11/21/2019, 1:12 PM  Pager: [phone] If no answer or after 5 PM call [phone]

## 2019-11-21 NOTE — Progress Notes (Signed)
Occupational Therapy Session Note  Patient Details  Name: [name] MRN: [id number] Date of Birth: 09-05-1951  Today's Date: 11/21/2019 OT Individual Time: [id number] OT Individual Time Calculation (min): 46 min  and Today's Date: 11/21/2019 OT Missed Time: 14 Minutes Missed Time Reason: Patient fatigue   Short Term Goals: Week 3:  OT Short Term Goal 1 (Week 3): Pt will perform shower transfer with mod A. OT Short Term Goal 2 (Week 3): Pt will perform toileting with mod A. OT Short Term Goal 3 (Week 3): Pt will complete LB dressing with mod assist OT Short Term Goal 4 (Week 3): Pt will tolerate sitting OOB 2 hrs to demonstrate increased activity tolerance  Skilled Therapeutic Interventions/Progress Updates:    PA cleared bedrest orders as INR has improved.  PA encouraging pt to engage in mobility as tolerated.    Treatment session with focus on functional mobility and activity tolerance.  Pt received in bed in sidelying reporting desire to get OOB.  Notified pt that bedrest orders had been removed and pt pleased to exit bed.  Completed bed mobility with min assist and squat pivot transfer mod-max assist to recliner.  Engaged in sitting task with focus on functional use of BUE and attention to task.  Pt losing interest and reporting fatigue in <2 minutes.  Encouraged pt to continue to participate in table top task.  Engaged in sit > stand x2 with BUE support on table.  Pt able to complete with mod assist.  Maintained standing 20-30 seconds with focus on weight shifting and upright posture.  Pt fatigued easily requesting to sit between standing attempts and then requesting to return to bed.  Pt reports pleased with ability to get OOB and understands that it will require additional effort and increased time to regain endurance.  Pt returned to bed and left in sidelying with all needs in reach.  Therapy Documentation Precautions:  Precautions Precautions: Fall Precaution Comments:  Strong L lateral lean, TLSO for mobility Required Braces or Orthoses: Other Brace(TLSO) Spinal Brace: Thoracolumbosacral orthotic Spinal Brace Comments: for comfort with mobility Restrictions Weight Bearing Restrictions: No General: General OT Amount of Missed Time: 14 Minutes Vital Signs: Therapy Vitals Temp: 98.6 F (37 C) Pulse Rate: 87 Resp: 16 BP: (!) 141/79 Patient Position (if appropriate): Lying Oxygen Therapy SpO2: 100 % O2 Device: Room Air Pain:  Pt with c/o pain in buttocks and Lt hip.  Repositioned.   Therapy/Group: Individual Therapy  [name] 11/21/2019, 3:30 PM

## 2019-11-21 NOTE — Progress Notes (Signed)
ANTICOAGULATION CONSULT NOTE - Follow-Up  Pharmacy Consult for Warfarin Indication: RIJ DVT (10/21/19), CVA (10/20/19)  No Known Allergies  Patient Measurements: Height: 5\' 7"  (170.2 cm) Weight: 124 lb (56.2 kg) IBW/kg (Calculated) : 61.6  Vital Signs: Temp: 98.6 F (37 C) (12/29 0553) Temp Source: Oral (12/29 0553) BP: 144/77 (12/29 0553) Pulse Rate: 85 (12/29 0553)  Labs: Recent Labs    11/19/19 0637 11/19/19 1737 11/20/19 1100 11/20/19 1314 11/21/19 0602  HGB  --  8.1* 8.6*  --   --   HCT  --  23.1* 28.3*  --   --   PLT  --  543* 247  --   --   LABPROT 18.4*  --  22.6*  --  20.6*  INR 1.5*  --  2.0*  --  1.8*  HEPARINUNFRC  --  1.03* 2.20*  --   --   CREATININE  --   --   --  6.32*  --     Estimated Creatinine Clearance: 7.5 mL/min (A) (by C-G formula based on SCr of 6.32 mg/dL (H)).  Assessment: 68 yr old female with ESRD who presented on 10/20/19 with new CVA and RIJ DVT 10/21/19. Pharmacy consulted to dose warfarin.   INR slightly subtherapeutic at 1.8. Based on response to vitamin K 1mg , patient is very deficient in PO intake. Eating per GI note. Will dose warfarin conservatively, especially with ciprofloxacin continuing until 12/06/19.    Goal of Therapy:  INR 2-3 Heparin Level: 0.3-0.7 Monitor platelets by anticoagulation protocol: Yes   Plan:  - Warfarin 1mg  x1 tonight (note interaction with Cipro)  - Daily PT/INR, CBC Q Mon - Monitor for signs/symptoms of bleeding  Benetta Spar, PharmD, BCPS, BCCP Clinical Pharmacist  Please check AMION for all [place] phone numbers After 10:00 PM, call [place]

## 2019-11-21 NOTE — Progress Notes (Signed)
[facility] Progress Note   Subjective:  HD yesterday 0.4L UF No new issues Objective Vitals:   11/20/19 1630 11/20/19 1636 11/20/19 1917 11/21/19 0553  BP: (!) 142/85 140/89 (!) 141/68 (!) 144/77  Pulse: 85 86 91 85  Resp:  18 19 18   Temp:  98.2 F (36.8 C) 98.7 F (37.1 C) 98.6 F (37 C)  TempSrc:  Oral  Oral  SpO2:  100% 100% 100%  Weight:  56.2 kg    Height:        Physical Exam General: Chronically ill appearing female, nad  Heart: RRR  Lungs: clear, bilaterally  Abdomen: soft nontender  Extremities: No LE edema  Dialysis Access: R IJ TDC in place    Weight change: -2.268 kg   Additional Objective Labs: Basic Metabolic Panel: Recent Labs  Lab 11/17/19 0859 11/18/19 1247 11/20/19 1314  NA 136 134* 136  K 3.9 3.9 4.0  CL 96* 97* 99  CO2 23 22 24   GLUCOSE 150* 119* 102*  BUN 30* 43* 28*  CREATININE 5.67* 7.58* 6.32*  CALCIUM 8.5* 8.2* 8.1*  PHOS  --  4.2 3.6   CBC: Recent Labs  Lab 11/15/19 1036 11/17/19 0859 11/18/19 1247 11/19/19 1737 11/20/19 1100  WBC 12.6* 9.1 8.7 10.9* 11.1*  HGB 8.5* 8.4* 7.6* 8.1* 8.6*  HCT 26.3* 27.1* 24.9* 23.1* 28.3*  MCV 88.9 91.2 92.2 89.5 92.8  PLT 241 274 297 543* 247   Blood Culture    Component Value Date/Time   SDES URINE, RANDOM 11/06/2019 1107   SPECREQUEST  11/06/2019 1107    NONE Performed at [facility], [place] [address], [place], [place] [address]    CULT 80,000 COLONIES/mL YEAST (A) 11/06/2019 1107   REPTSTATUS 11/07/2019 FINAL 11/06/2019 1107     Medications: . sodium chloride    . sodium chloride    . cefTAZidime (FORTAZ)  IV Stopped (11/20/19 2150)  . ferric gluconate (FERRLECIT/NULECIT) IV 125 mg (11/20/19 1753)   . acetaminophen  500 mg Oral TID  . calcium carbonate  1 tablet Oral 2 times per day  . carvedilol  12.5 mg Oral BID  . Chlorhexidine Gluconate Cloth  6 each Topical Q0600  . ciprofloxacin  500 mg Oral Q24H  . darbepoetin (ARANESP) injection -  DIALYSIS  100 mcg Intravenous Q Mon-HD  . doxercalciferol  1.5 mcg Intravenous Q M,W,F-HD  . feeding supplement (ENSURE ENLIVE)  237 mL Oral BID BM  . insulin aspart  0-9 Units Subcutaneous TID WC  . isosorbide mononitrate  30 mg Oral QPC lunch  . lactulose  20 g Oral TID  . lidocaine  1 patch Transdermal Q24H  . Melatonin  3 mg Oral QHS  . multivitamin  1 tablet Oral QPC lunch  . pantoprazole  40 mg Oral Daily  . Warfarin - Pharmacist Dosing Inpatient   Does not apply q1800    Dialysis Orders:  [facility] MWF3.5 hours TDC - mult failed accesses Profile 1 - pressure drops very easily;max is pull 1.1 an hour; do not pull rinseback revaclear 300 dialyzer 400/600 2 K 2.5 Ca EDW 62.5  Epo 1200 U q HD; iron 50 mg/weekly 1.5 mcg hectoral each tx  Assessment/Plan: 1. Recurrent Pseudomonas bacteremia with evidence of mitral valve endocarditisthat is post [facility] exchange for infection focus control. Seen by TCTS w/no  plan for surgical intervention. Completed 26 days cefepime. Antibiotics changed to [facility] per ID 12/25.  2. Progression L3-4 discitis on MRI. Evaluated by  neurosurgery. No indication for surgical intervention. ID reconsulted/following. Antibiotics changed to Fortaz/Cipro. Defer aspiration for now.  3. ESRD: MWF HD. Next HD 12/30.  3K bath here. Tight heparin.  4. Anemia:hgb  8s. ESA qMon. 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  6. 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 Ongoing inpatient rehabilitation for management of neurological deficits. Repeat MRI showedno acute changes 7. Hypertension/volume : BP low/stable.  Net UF 239ml. UF 0.5 next HD.  8.Nutrition -diet liberalized to regular - has supplements/multivits ordered - alb very low - not eating much - contributing to weakness 9. Ascending  colon pneumatosis - seen on CT 12/24. No surgical intervention. 10. Deconditioning -in [place]  11/21/2019,12:37 PM  LOS: 20 days

## 2019-11-21 NOTE — Progress Notes (Signed)
[place] PHYSICAL MEDICINE & REHABILITATION PROGRESS NOTE   Subjective/Complaints: Patient seen laying in bed this morning.  She states she slept well overnight.  No reported issues overnight.  She was seen yesterday by CT surgery, general surgery, hospitalist, ID, nephrology, gastroenterology.  Notes reviewed.    ROS: Denies CP, SOB, N/V/D  Objective:   ECHOCARDIOGRAM LIMITED  Result Date: 11/20/2019   ECHOCARDIOGRAM LIMITED REPORT   Patient Name:   [name] Date of Exam: 11/20/2019 Medical Rec #:  [id number]        Height:       67.0 in Accession #:    [id number]       Weight:       130.0 lb Date of Birth:  06-Aug-1951       BSA:          1.68 m Patient Age:    68 years         BP:           150/88 mmHg Patient Gender: F                HR:           82 bpm. Exam Location:  Inpatient  Procedure: Limited Echo, 3D Echo, Cardiac Doppler and Color Doppler Indications:    Endocarditis of mitral valve.  History:        Patient has prior history of Echocardiogram examinations, most                 recent 10/26/2019. ESRD. Endocarditis of the anterior leaflet of                 the mitral valve.  Sonographer:    Roseanna Rainbow RDCS Referring Phys: [place] TRIGT  Sonographer Comments: Technically difficult study due to poor echo windows. IMPRESSIONS  1. Left ventricular ejection fraction, by visual estimation, is 55 to 60%. The left ventricle has normal function. Left ventricular septal wall thickness was severely increased. Moderately increased left ventricular posterior wall thickness.  2. Elevated left atrial and left ventricular end-diastolic pressures.  3. Left ventricular diastolic parameters are consistent with Grade I diastolic dysfunction (impaired relaxation).  4. Moderate thickening of the anterior mitral valve leaflet(s).  5. Moderate vegetation on the mitral valve.  6. The mitral valve is abnormal. Moderate to severe mitral valve regurgitation.  7. Tricuspid valve regurgitation moderate.   8. Tricuspid valve regurgitation moderate.  9. Moderate thickening. 10. Normal pulmonary artery systolic pressure. 11. A prior study was performed on 10/26/2019. 12. Changes from prior study are noted. 13. Compared to the prior TEE, moderate AMVL endocarditis persists with a mobile mass noted on the anterior leaflet ~0.5 cm in diameter, may be vegetation or ruptured apparatus. There is moderate to severe, posteriorly directed MR. Clinical correlation with CHF symptoms is recommended. FINDINGS  Left Ventricle: Left ventricular ejection fraction, by visual estimation, is 55 to 60%. The left ventricle has normal function. Left ventricular septal wall thickness was severely increased. Moderately increased left ventricular posterior wall thickness. Left ventricular diastolic parameters are consistent with Grade I diastolic dysfunction (impaired relaxation). Elevated left atrial and left ventricular end-diastolic pressures. Right Ventricle: The tricuspid regurgitant velocity is 2.16 m/s, and with an assumed right atrial pressure of 3 mmHg, the estimated right ventricular systolic pressure is normal at 21.7 mmHg. Mitral Valve: The mitral valve is abnormal. There is moderate thickening of the anterior mitral valve leaflet(s). A moderate vegetation is seen on the anterior  mitral leaflet. The MV vegetation measures 5 mm x 5 mm. MV Area by PHT, 3.99 cm. MV PHT, 55.10 msec. Moderate to severe mitral valve regurgitation, with posteriorly-directed jet. Tricuspid Valve: Tricuspid valve regurgitation moderate. Aortic Valve: The aortic valve is tricuspid. Moderate thickening. Venous: The inferior vena cava was not well visualized. Additional Comments: A prior study was performed on 10/26/2019.  LEFT VENTRICLE          Normals PLAX 2D LVIDd:         2.80 cm  3.6 cm LVIDs:         2.10 cm  1.7 cm LV PW:         1.60 cm  1.4 cm LV IVS:        1.60 cm  1.3 cm LVOT diam:     1.80 cm  2.0 cm LV SV:         15 ml    79 ml LV SV Index:    9.09     45 ml/m2 LVOT Area:     2.54 cm 3.14 cm2  LV Volumes (MOD)             Normals LV area d, A4C:    18.70 cm LV area s, A4C:    10.20 cm LV major d, A4C:   6.12 cm LV major s, A4C:   4.90 cm LV vol d, MOD A4C: 45.5 ml LV vol s, MOD A4C: 17.7 ml LV SV MOD A4C:     45.5 ml LEFT ATRIUM         Index LA diam:    4.00 cm 2.38 cm/m  AORTIC VALVE             Normals LVOT Vmax:   110.00 cm/s LVOT Vmean:  79.400 cm/s 75 cm/s LVOT VTI:    0.220 m     25.3 cm  AORTA                 Normals Ao Root diam: 3.00 cm 31 mm MITRAL VALVE              Normals   TRICUSPID VALVE             Normals MV Area (PHT): 3.99 cm             TR Peak grad:   18.7 mmHg MV PHT:        55.10 msec 55 ms     TR Vmax:        216.00 cm/s 288 cm/s MV Decel Time: 190 msec   187 ms MV E velocity: 85.30 cm/s 103 cm/s  SHUNTS MV A velocity: 99.50 cm/s 70.3 cm/s Systemic VTI:  0.22 m MV E/A ratio:  0.86       1.5       Systemic Diam: 1.80 cm  Lyman Bishop MD Electronically signed by Lyman Bishop MD Signature Date/Time: 11/20/2019/10:54:30 AMThe mitral valve is abnormal.    Final    Recent Labs    11/19/19 1737 11/20/19 1100  WBC 10.9* 11.1*  HGB 8.1* 8.6*  HCT 23.1* 28.3*  PLT 543* 247   Recent Labs    11/18/19 1247 11/20/19 1314  NA 134* 136  K 3.9 4.0  CL 97* 99  CO2 22 24  GLUCOSE 119* 102*  BUN 43* 28*  CREATININE 7.58* 6.32*  CALCIUM 8.2* 8.1*    Intake/Output Summary (Last 24 hours) at 11/21/2019 0743 Last data filed at 11/20/2019 1636 Gross per  24 hour  Intake 120 ml  Output 500 ml  Net -380 ml     Physical Exam: Vital Signs Blood pressure (!) 144/77, pulse 85, temperature 98.6 F (37 C), temperature source Oral, resp. rate 18, height 5\' 7"  (1.702 m), weight 56.2 kg, SpO2 100 %.  Constitutional: No distress . Vital signs reviewed. HENT: Normocephalic.  Atraumatic. Eyes: EOMI. No discharge. Cardiovascular: No JVD. Respiratory: Normal effort.  No stridor. GI: Non-distended. Skin: Sacral ulcer not  examined today Psych: Flat. Neurological: Alert and oriented x3 Motor: RUE: 5/5 proximal to distal RLE: HF, KE 4/5, ADF 4/5 LUE: 4+/5 proximal to distal LLE: HF, KE 4/5, ADF 4/5  Assessment/Plan: 1. Functional deficits secondary to right basal ganglia stroke which require 3+ hours per day of interdisciplinary therapy in a comprehensive inpatient rehab setting.  Physiatrist is providing close team supervision and 24 hour management of active medical problems listed below.  Physiatrist and rehab team continue to assess barriers to discharge/monitor patient progress toward functional and medical goals  Care Tool:  Bathing    Body parts bathed by patient: Right arm, Left arm, Chest, Abdomen, Front perineal area, Right upper leg, Left upper leg, Face   Body parts bathed by helper: Front perineal area, Buttocks, Right upper leg, Left upper leg, Right lower leg, Left lower leg, Face     Bathing assist Assist Level: Total Assistance - Patient < 25%     Upper Body Dressing/Undressing Upper body dressing   What is the patient wearing?: Pull over shirt    Upper body assist Assist Level: Minimal Assistance - Patient > 75%    Lower Body Dressing/Undressing Lower body dressing      What is the patient wearing?: Pants     Lower body assist Assist for lower body dressing: Moderate Assistance - Patient 50 - 74%     Toileting Toileting    Toileting assist Assist for toileting: Maximal Assistance - Patient 25 - 49%     Transfers Chair/bed transfer  Transfers assist     Chair/bed transfer assist level: Dependent - mechanical lift     Locomotion Ambulation   Ambulation assist   Ambulation activity did not occur: Safety/medical concerns(back pain, bilateral LE weakness, poor bilateral knee control)  Assist level: 2 helpers Assistive device: Walker-rolling Max distance: 36ft   Walk 10 feet activity   Assist  Walk 10 feet activity did not occur: Safety/medical  concerns(back pain, bilateral LE weakness, poor bilateral knee control)  Assist level: 2 helpers Assistive device: Walker-rolling   Walk 50 feet activity   Assist Walk 50 feet with 2 turns activity did not occur: Safety/medical concerns(back pain, bilateral LE weakness, poor bilateral knee control)  Assist level: 2 helpers Assistive device: Walker-rolling    Walk 150 feet activity   Assist Walk 150 feet activity did not occur: Safety/medical concerns(back pain, bilateral LE weakness, poor bilateral knee control)         Walk 10 feet on uneven surface  activity   Assist Walk 10 feet on uneven surfaces activity did not occur: Safety/medical concerns(back pain, bilateral LE weakness, poor bilateral knee control)         Wheelchair     Assist Will patient use wheelchair at discharge?: Yes Type of Wheelchair: Manual Wheelchair activity did not occur: Safety/medical concerns(Pt could not tolerate sitting in WC for long enough to complete activity due to low back pain)  Wheelchair assist level: Minimal Assistance - Patient > 75% Max wheelchair distance: 52ft  Wheelchair 50 feet with 2 turns activity    Assist    Wheelchair 50 feet with 2 turns activity did not occur: Safety/medical concerns(Pt could not tolerate sitting in WC for long enough to complete activity due to low back pain)       Wheelchair 150 feet activity     Assist  Wheelchair 150 feet activity did not occur: Safety/medical concerns(Pt could not tolerate sitting in WC for long enough to complete activity due to low back pain)       Blood pressure (!) 144/77, pulse 85, temperature 98.6 F (37 C), temperature source Oral, resp. rate 18, height 5\' 7"  (1.702 m), weight 56.2 kg, SpO2 100 %.    Medical Problem List and Plan: 1.  Impaired gait with left side weakness secondary to right basal ganglia infarction as well as small cortical infarct left frontal lobe  RUE  CT head on 12/13  personally reviewed, showing some improvement, MRI personally reviewed, stable. Repeat MRI ordered again by ID, showing stable infarcts  Chest x-ray personally reviewed, unremarkable for infection  Discussed with neurology results of the EEG -findings possibly related to uremia +/- cefepime  Discussed with psychiatry, continue medical management  Discussed with hospitalist, appreciate recs, discussed with hospitalist again on 12/25  Plan for palliative care consult next week 2.  Antithrombotics: -DVT/anticoagulation: Chronic Coumadin for right IJ acute DVT.  Plan anticoagulation x3 months  INR 1.8 on 12/29, discussed heparin GGT with pharmacy             -antiplatelet therapy: N/A 3. Pain Management chronic back and left hip pain: Neurontin 100 mg on hold  TLSO back brace for comfort  Lidocaine patch added to coccyx 4. Mood: Provide emotional support             -antipsychotic agents: N/A 5. Neuropsych: This patient is not capable of making decisions on her own behalf. 6. Skin/Wound Care: Routine skin checks  Santyl to sacral wound 7. Fluids/Electrolytes/Nutrition: Routine in and outs 8.  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  New HD tunneled catheter placed 10/26/2019.  Repeat echo showing increase in mitral regurg  CT surgery recommending mitral valve replacement-we will follow as outpatient. 9.  ID/discitis/recurrent Pseudomonas bacteremia.    Cipro/Maxipime changed to ceftazidime/Cipro through 12/06/2019.  Seen by neurosurgery on 12/25, no surgical intervention recommended at this point, however recommended reconsulting ID  Appreciate ID recs  10.  Hemodialysis MWF.  Follow-up per renal services.  11.  Diabetes mellitus.  Hemoglobin A1c 7.6.  SSI. CBG (last 3)  Recent Labs    11/20/19 1711 11/20/19 2116 11/21/19 0621  GLUCAP 73 110* 95   Slightly labile on 12/29 12.  Hypertension.  Imdur 30 mg  daily  Coreg 6.25 mg twice daily, increased to 12.5 on 12/15, parameters placed.    Monitor with increased mobility.  Relatively controlled on 12/29 13.  Acute on chronic anemia.    Hemoglobin 8.6 on 12/28  Continue to monitor 14. Constipation:  See #16  Abd xray showing?  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  Candidiasis  Diflucan ordered on 12/16  Repeat Ucx not collected yet on 12/29 16.  Hyperammonemia with hepatic encephalopathy  Lactulose started on 12/17, encourage compliance  Ammonia WNL on 12/22 17. Sleep disturance  Melatonin added on 12/17  Sleep chart ordered  Improving 18.  Leukocytosis  WBCs 11.1 on 12/28, labs with HD  Afebrile  Continue  to monitor 19.  GERD  Home Protonix ordered on 12/20 20.  Pneumatosis Coli  CT abdomen/pelvis on 12/24?  Obese.  Appreciate surgery following-recommending GI recs, no surgical intervention  Appreciate GI recs, advising against colonoscopy and recommending conservative care at present with repeat imaging in 1-3 months..   LOS: 20 days A FACE TO FACE EVALUATION WAS PERFORMED  Azaiah Mello Lorie Phenix 11/21/2019, 7:43 AM

## 2019-11-21 NOTE — Plan of Care (Signed)
  Problem: Consults Goal: RH STROKE PATIENT EDUCATION Description: See Patient Education module for education specifics  Outcome: Progressing Goal: Nutrition Consult-if indicated Outcome: Progressing Goal: Diabetes Guidelines if Diabetic/Glucose > 140 Description: If diabetic or lab glucose is > 140 mg/dl - Initiate Diabetes/Hyperglycemia Guidelines & Document Interventions  Outcome: Progressing   Problem: RH BOWEL ELIMINATION Goal: RH STG MANAGE BOWEL WITH ASSISTANCE Description: STG Manage Bowel with mod I/min Assistance. Outcome: Progressing Goal: RH STG MANAGE BOWEL W/MEDICATION W/ASSISTANCE Description: STG Manage Bowel with Medication with mod I Assistance. Outcome: Progressing   Problem: RH SKIN INTEGRITY Goal: RH STG SKIN FREE OF INFECTION/BREAKDOWN Description: Patients skin will remain free from further infection or breakdown with min assist. Outcome: Progressing Goal: RH STG MAINTAIN SKIN INTEGRITY WITH ASSISTANCE Description: STG Maintain Skin Integrity With min Assistance. Outcome: Progressing   Problem: RH SAFETY Goal: RH STG ADHERE TO SAFETY PRECAUTIONS W/ASSISTANCE/DEVICE Description: STG Adhere to Safety Precautions With supervision/min Assistance/Device. Outcome: Progressing Goal: RH STG DECREASED RISK OF FALL WITH ASSISTANCE Description: STG Decreased Risk of Fall With supervision/min Assistance. Outcome: Progressing   Problem: RH PAIN MANAGEMENT Goal: RH STG PAIN MANAGED AT OR BELOW PT'S PAIN GOAL Description: < 5 Outcome: Progressing   Problem: RH KNOWLEDGE DEFICIT Goal: RH STG INCREASE KNOWLEDGE OF DIABETES Description: Patient/caregiver will verbalize understanding of DM including diet, exercise, medications, monitoring, and follow up care with min assist. Outcome: Progressing Goal: RH STG INCREASE KNOWLEDGE OF HYPERTENSION Description: Patient/caregiver will verbalize understanding of HTN including diet, exercise, medications, monitoring, and follow  up care with min assist. Outcome: Progressing Goal: RH STG INCREASE KNOWLEGDE OF HYPERLIPIDEMIA Description: Patient/caregiver will verbalize understanding of HLD including diet, exercise, medications, monitoring, and follow up care with min assist. Outcome: Progressing Goal: RH STG INCREASE KNOWLEDGE OF STROKE PROPHYLAXIS Description: Patient/caregiver will verbalize understanding of stroke prophylaxis including diet, exercise, medications, monitoring, and follow up care with min assist. Outcome: Progressing

## 2019-11-21 NOTE — Progress Notes (Signed)
PROGRESS NOTE    [name]  [id number] DOB: 10/28/1951 DOA: 11/01/2019 PCP: Manon Hilding, MD   Brief Narrative: [name] is a 68 y.o. female with a history of ESRD on HD, mitral valve endocarditis, hepatic encephalopathy, chronic anemia, diabetes mellitus, CVA. Patient is currently admitted to inpatient rehab for post-CVA rehabilitation and was found to have worsening mentation.   Assessment & Plan:   Principal Problem:   Cerebrovascular accident (CVA) of right basal ganglia (HCC) Active Problems:   Slow transit constipation   Acute on chronic anemia   Labile blood pressure   Labile blood glucose   Diabetes mellitus type 2 in nonobese (HCC)   Bacteremia   Endocarditis of mitral valve   Septic embolism (HCC)   Seizure (HCC)   Candidiasis   Encephalopathy, hepatic (HCC)   Hyperammonemia (HCC)   Sleep disturbance   Hemodialysis-associated hypotension   Supratherapeutic INR   Gastroesophageal reflux disease   Decubitus ulcer of sacral region, unstageable ([place])   Ogilvie's syndrome   Pneumatosis coli   Encephalopathy Unknown etiology. Thought possibly secondary to delirium. Appears to be improved today but just somnolent possibly secondary to Seroquel overnight. Resolved.  Diabetes mellitus, type 2 Patient is on insulin NPH-regular (70-30) 4 units BID WC as an outpatient. Currently on SSI. Blood sugar controlled. -Continue SSI  Hyperammonemia History of hepatic encephalopathy -Continue lactulose  Ogilvie's syndrome Initially with symptoms on 12/23. Abdominal X-ray concerning for volvulus and CT scan significant for evidence of pneumatosis coli. General surgery consulted and assessment includes likely Olgivie's syndrome. Abdominal pain is improved from early but she still is tender in the RUQ. -General surgery recommendations: GI consult -GI recommendations: observation  Discitis L3-L4. In setting of bacterial endocarditis. Patient does have a  history of sacral decubitus ulcer. Afebrile and normal WBC. Pelvic x-ray not suggestive of fracture. MRI obtained which shows concern for infectious process. Neurosurgery consulted and their concern is discitis; recommended ID re-consult. Patient has been afebrile and WBC is normal today. Patient currently on Ceftazidime and Ciprofloxacin -Per ID  Coagulopathy Secondary to Coumadin. INR appears to have reached plateau . No obvious source of bleeding. Hemoglobin stable. Given 1 mg of vitamin K with reversal of INR down to 2.1. Unsure of etiology but this could possibly be secondary to vitamin K deficiency.  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 Evaluated by neurology with LT EEG. Gabapentin discontinued. No reported recurrences.   Right IJ DVT Acute from hospitalization in November. Started on Coumadin. -Coumadin -Start heparin for bridge in setting of subtherapeutic INR and recent vitamin K administration  Mitral valve endocarditis Seen by cardiothoracic surgery on medical admission. No plan for replacement valve at this acute period but will need follow-up as an outpatient. Was on Cefepime/cipro, transitioned to Cefepime monotherapy and now is on Ceftazidime and Cipro. TCTS reevaluated MV with limited Transthoracic Echocardiogram on 12/28 -Per ID -TCTS: recommendations pending  Essential hypertension -Continue Imdur  CVA Recently hospitalized and is now undergoing inpatient rehabilitation. Per stroke team, does not appear aspirin was considered for continuation on discharge. -Continue Coumadin  Pressure injury Medial sacrum, not POA    DVT prophylaxis: Coumadin (currently held secondary to supertherapeutic INR) Code Status:   Code Status: DNR Family Communication: None at bedside   Procedures:   HD  12/28: Limited Transthoracic Echocardiogram IMPRESSIONS    1. Left ventricular ejection fraction, by visual estimation, is 55 to 60%. The left ventricle has  normal function. Left ventricular septal wall thickness  was severely increased. Moderately increased left ventricular posterior wall thickness.  2. Elevated left atrial and left ventricular end-diastolic pressures.  3. Left ventricular diastolic parameters are consistent with Grade I diastolic dysfunction (impaired relaxation).  4. Moderate thickening of the anterior mitral valve leaflet(s).  5. Moderate vegetation on the mitral valve.  6. The mitral valve is abnormal. Moderate to severe mitral valve regurgitation.  7. Tricuspid valve regurgitation moderate.  8. Tricuspid valve regurgitation moderate.  9. Moderate thickening. 10. Normal pulmonary artery systolic pressure. 11. A prior study was performed on 10/26/2019. 12. Changes from prior study are noted. 13. Compared to the prior TEE, moderate AMVL endocarditis persists with a mobile mass noted on the anterior leaflet ~0.5 cm in diameter, may be vegetation or ruptured apparatus. There is moderate to severe, posteriorly directed MR. Clinical correlation  with CHF symptoms is recommended.  Antimicrobials:  Cefepime  Ceftazidime   Subjective: Back pain when she lies down on her back. Some intermittent abdominal pain that feels like gas. Tired of being confined to the bed.  Objective: Vitals:   11/20/19 1630 11/20/19 1636 11/20/19 1917 11/21/19 0553  BP: (!) 142/85 140/89 (!) 141/68 (!) 144/77  Pulse: 85 86 91 85  Resp:  18 19 18   Temp:  98.2 F (36.8 C) 98.7 F (37.1 C) 98.6 F (37 C)  TempSrc:  Oral  Oral  SpO2:  100% 100% 100%  Weight:  56.2 kg    Height:        Intake/Output Summary (Last 24 hours) at 11/21/2019 1200 Last data filed at 11/20/2019 1636 Gross per 24 hour  Intake 0 ml  Output 500 ml  Net -500 ml   Filed Weights   11/20/19 0528 11/20/19 1256 11/20/19 1636  Weight: 59 kg 56.7 kg 56.2 kg    Examination:  General exam: Appears calm and comfortable Respiratory system: Clear to auscultation.  Respiratory effort normal. Cardiovascular system: S1 & S2 heard, RRR. 2/6 systolic murmur. Gastrointestinal system: Abdomen is slightly distended, soft and mildly tender in RUQ. Normal bowel sounds heard. Central nervous system: Alert and oriented. No focal neurological deficits. Extremities: No edema. No calf tenderness Skin: No cyanosis. No rashes Psychiatry: Judgement and insight appear normal. Mood & affect appropriate.     Data Reviewed: I have personally reviewed following labs and imaging studies  CBC: Recent Labs  Lab 11/15/19 1036 11/17/19 0859 11/18/19 1247 11/19/19 1737 11/20/19 1100  WBC 12.6* 9.1 8.7 10.9* 11.1*  HGB 8.5* 8.4* 7.6* 8.1* 8.6*  HCT 26.3* 27.1* 24.9* 23.1* 28.3*  MCV 88.9 91.2 92.2 89.5 92.8  PLT 241 274 297 543* [id number]   Basic Metabolic Panel: Recent Labs  Lab 11/15/19 1036 11/17/19 0859 11/18/19 1247 11/20/19 1314  NA 135 136 134* 136  K 3.8 3.9 3.9 4.0  CL 96* 96* 97* 99  CO2 22 23 22 24   GLUCOSE 138* 150* 119* 102*  BUN 35* 30* 43* 28*  CREATININE 6.38* 5.67* 7.58* 6.32*  CALCIUM 8.6* 8.5* 8.2* 8.1*  PHOS  --   --  4.2 3.6   GFR: Estimated Creatinine Clearance: 7.5 mL/min (A) (by C-G formula based on SCr of 6.32 mg/dL (H)). Liver Function Tests: Recent Labs  Lab 11/15/19 1036 11/18/19 1247 11/20/19 1314  AST 20  --   --   ALT 57*  --   --   ALKPHOS 111  --   --   BILITOT 0.4  --   --   PROT 6.6  --   --  ALBUMIN 2.0* 1.7* 1.8*   No results for input(s): LIPASE, AMYLASE in the last 168 hours. Recent Labs  Lab 11/15/19 1036  AMMONIA 33   Coagulation Profile: Recent Labs  Lab 11/17/19 0504 11/18/19 0717 11/19/19 0637 11/20/19 1100 11/21/19 0602  INR 5.8* 2.1* 1.5* 2.0* 1.8*   Cardiac Enzymes: No results for input(s): CKTOTAL, CKMB, CKMBINDEX, TROPONINI in the last 168 hours. BNP (last 3 results) No results for input(s): PROBNP in the last 8760 hours. HbA1C: No results for input(s): HGBA1C in the last 72  hours. CBG: Recent Labs  Lab 11/20/19 0632 11/20/19 1147 11/20/19 1711 11/20/19 2116 11/21/19 0621  GLUCAP 88 127* 73 110* 95   Lipid Profile: No results for input(s): CHOL, HDL, LDLCALC, TRIG, CHOLHDL, LDLDIRECT in the last 72 hours. Thyroid Function Tests: No results for input(s): TSH, T4TOTAL, FREET4, T3FREE, THYROIDAB in the last 72 hours. Anemia Panel: No results for input(s): VITAMINB12, FOLATE, FERRITIN, TIBC, IRON, RETICCTPCT in the last 72 hours. Sepsis Labs: No results for input(s): PROCALCITON, LATICACIDVEN in the last 168 hours.  No results found for this or any previous visit (from the past 240 hour(s)).       Radiology Studies: ECHOCARDIOGRAM LIMITED  Result Date: 11/20/2019   ECHOCARDIOGRAM LIMITED REPORT   Patient Name:   [name] Date of Exam: 11/20/2019 Medical Rec #:  [id number]        Height:       67.0 in Accession #:    [id number]       Weight:       130.0 lb Date of Birth:  1951-11-15       BSA:          1.68 m Patient Age:    38 years         BP:           150/88 mmHg Patient Gender: F                HR:           82 bpm. Exam Location:  Inpatient  Procedure: Limited Echo, 3D Echo, Cardiac Doppler and Color Doppler Indications:    Endocarditis of mitral valve.  History:        Patient has prior history of Echocardiogram examinations, most                 recent 10/26/2019. ESRD. Endocarditis of the anterior leaflet of                 the mitral valve.  Sonographer:    Roseanna Rainbow RDCS Referring Phys: [place] TRIGT  Sonographer Comments: Technically difficult study due to poor echo windows. IMPRESSIONS  1. Left ventricular ejection fraction, by visual estimation, is 55 to 60%. The left ventricle has normal function. Left ventricular septal wall thickness was severely increased. Moderately increased left ventricular posterior wall thickness.  2. Elevated left atrial and left ventricular end-diastolic pressures.  3. Left ventricular diastolic parameters  are consistent with Grade I diastolic dysfunction (impaired relaxation).  4. Moderate thickening of the anterior mitral valve leaflet(s).  5. Moderate vegetation on the mitral valve.  6. The mitral valve is abnormal. Moderate to severe mitral valve regurgitation.  7. Tricuspid valve regurgitation moderate.  8. Tricuspid valve regurgitation moderate.  9. Moderate thickening. 10. Normal pulmonary artery systolic pressure. 11. A prior study was performed on 10/26/2019. 12. Changes from prior study are noted. 13. Compared to the prior TEE, moderate AMVL endocarditis persists  with a mobile mass noted on the anterior leaflet ~0.5 cm in diameter, may be vegetation or ruptured apparatus. There is moderate to severe, posteriorly directed MR. Clinical correlation with CHF symptoms is recommended. FINDINGS  Left Ventricle: Left ventricular ejection fraction, by visual estimation, is 55 to 60%. The left ventricle has normal function. Left ventricular septal wall thickness was severely increased. Moderately increased left ventricular posterior wall thickness. Left ventricular diastolic parameters are consistent with Grade I diastolic dysfunction (impaired relaxation). Elevated left atrial and left ventricular end-diastolic pressures. Right Ventricle: The tricuspid regurgitant velocity is 2.16 m/s, and with an assumed right atrial pressure of 3 mmHg, the estimated right ventricular systolic pressure is normal at 21.7 mmHg. Mitral Valve: The mitral valve is abnormal. There is moderate thickening of the anterior mitral valve leaflet(s). A moderate vegetation is seen on the anterior mitral leaflet. The MV vegetation measures 5 mm x 5 mm. MV Area by PHT, 3.99 cm. MV PHT, 55.10 msec. Moderate to severe mitral valve regurgitation, with posteriorly-directed jet. Tricuspid Valve: Tricuspid valve regurgitation moderate. Aortic Valve: The aortic valve is tricuspid. Moderate thickening. Venous: The inferior vena cava was not well  visualized. Additional Comments: A prior study was performed on 10/26/2019.  LEFT VENTRICLE          Normals PLAX 2D LVIDd:         2.80 cm  3.6 cm LVIDs:         2.10 cm  1.7 cm LV PW:         1.60 cm  1.4 cm LV IVS:        1.60 cm  1.3 cm LVOT diam:     1.80 cm  2.0 cm LV SV:         15 ml    79 ml LV SV Index:   9.09     45 ml/m2 LVOT Area:     2.54 cm 3.14 cm2  LV Volumes (MOD)             Normals LV area d, A4C:    18.70 cm LV area s, A4C:    10.20 cm LV major d, A4C:   6.12 cm LV major s, A4C:   4.90 cm LV vol d, MOD A4C: 45.5 ml LV vol s, MOD A4C: 17.7 ml LV SV MOD A4C:     45.5 ml LEFT ATRIUM         Index LA diam:    4.00 cm 2.38 cm/m  AORTIC VALVE             Normals LVOT Vmax:   110.00 cm/s LVOT Vmean:  79.400 cm/s 75 cm/s LVOT VTI:    0.220 m     25.3 cm  AORTA                 Normals Ao Root diam: 3.00 cm 31 mm MITRAL VALVE              Normals   TRICUSPID VALVE             Normals MV Area (PHT): 3.99 cm             TR Peak grad:   18.7 mmHg MV PHT:        55.10 msec 55 ms     TR Vmax:        216.00 cm/s 288 cm/s MV Decel Time: 190 msec   187 ms MV E velocity: 85.30 cm/s 103 cm/s  SHUNTS  MV A velocity: 99.50 cm/s 70.3 cm/s Systemic VTI:  0.22 m MV E/A ratio:  0.86       1.5       Systemic Diam: 1.80 cm  Lyman Bishop MD Electronically signed by Lyman Bishop MD Signature Date/Time: 11/20/2019/10:54:30 AMThe mitral valve is abnormal.    Final         Scheduled Meds: . acetaminophen  500 mg Oral TID  . calcium carbonate  1 tablet Oral 2 times per day  . carvedilol  12.5 mg Oral BID  . Chlorhexidine Gluconate Cloth  6 each Topical Q0600  . ciprofloxacin  500 mg Oral Q24H  . darbepoetin (ARANESP) injection - DIALYSIS  100 mcg Intravenous Q Mon-HD  . doxercalciferol  1.5 mcg Intravenous Q M,W,F-HD  . feeding supplement (ENSURE ENLIVE)  237 mL Oral BID BM  . insulin aspart  0-9 Units Subcutaneous TID WC  . isosorbide mononitrate  30 mg Oral QPC lunch  . lactulose  20 g Oral TID  .  lidocaine  1 patch Transdermal Q24H  . Melatonin  3 mg Oral QHS  . multivitamin  1 tablet Oral QPC lunch  . pantoprazole  40 mg Oral Daily  . Warfarin - Pharmacist Dosing Inpatient   Does not apply q1800   Continuous Infusions: . sodium chloride    . sodium chloride    . cefTAZidime (FORTAZ)  IV Stopped (11/20/19 2150)  . ferric gluconate (FERRLECIT/NULECIT) IV 125 mg (11/20/19 1753)     LOS: 20 days     Cordelia Poche, MD [facility] 11/21/2019, 12:00 PM  If 7PM-7AM, please contact night-coverage [email or web address]

## 2019-11-21 NOTE — Progress Notes (Signed)
Physical Therapy Weekly Progress Note  Patient Details  Name: [name] MRN: [id number] Date of Birth: [birth year]  Beginning of progress report period: November 02, 2019 End of progress report period: November 21, 2019  Today's Date: 11/21/2019 PT Individual Time: 0900-1000 and [id number] PT Individual Time Calculation (min): 60 min and 43 min  Patient has met 1 of 3 short term goals. Pt continues to be limited by bedrest orders and is eager to participate in OOB mobility. Pt currently able to roll to L and R with supervision and use of bedrails. Pt able transfer supine<>sit and sit<>supine min A. Pt able to maintain static/dynamic sitting balance with min A/CGA. Unable to assess any transfers/OOB mobility since 12/23 due to bedrest orders. Pt continues to report low back pain and buttocks pain due to pressure sore.   Patient continues to demonstrate the following deficits muscle weakness, impaired timing and sequencing, decreased coordination and decreased motor planning and decreased sitting balance, decreased standing balance, decreased postural control, hemiplegia and decreased balance strategies and therefore will continue to benefit from skilled PT intervention to increase functional independence with mobility.  Patient progressing toward long term goals..  Continue plan of care.  PT Short Term Goals Week 3:  PT Short Term Goal 1 (Week 3): Pt will transfer supine<>sit and sit<>supine with min A PT Short Term Goal 1 - Progress (Week 3): Met PT Short Term Goal 2 (Week 3): Pt will transfer stand<>pivot with LRAD mod A PT Short Term Goal 2 - Progress (Week 3): Not progressing PT Short Term Goal 3 (Week 3): Pt will perform car transfer with LRAD mod A PT Short Term Goal 3 - Progress (Week 3): Not progressing Week 4:  PT Short Term Goal 1 (Week 4): Pt will transfer supine<>sit and sit<>supine with CGA PT Short Term Goal 2 (Week 4): Pt will transfer stand<>pivot with LRAD mod A PT  Short Term Goal 3 (Week 4): Pt will perform car transfer with LRAD mod A  Skilled Therapeutic Interventions/Progress Updates:  Ambulation/gait training;Discharge planning;DME/adaptive equipment instruction;Functional mobility training;Pain management;Psychosocial support;Splinting/orthotics;Therapeutic Activities;UE/LE Strength taining/ROM;Balance/vestibular training;Community reintegration;Disease management/prevention;Functional electrical stimulation;Neuromuscular re-education;Patient/family education;Stair training;Therapeutic Exercise;UE/LE Coordination activities;Wheelchair propulsion/positioning   Today's Interventions: Treatment Session 1: 0900-1000 60 min Received pt supine in bed, pt agreeable to therapy, and did not report pain initially during session. Session focused on bed mobility, tolerance to upright position, dynamic sitting balance, postural alignment and trunk control, toileting, dressing, and improved tolerance to activity. Pt transferred supine<>sit min A with use of bedrails. Pt able to maintain static sitting balance for approximately 30 seconds before reporting increased dizziness. Sit<>supine min A. Pt attempted supine<>sit trial 2 and able to maintain balance longer prior to onset of dizziness. Returned to supine. BP in supine 134/81 and HR 81bpm. In sitting BP 114/78 and HR 85bpm. Pt c/o increased dizziness and requested to lie back down. Pt able to perform 2 rounds of seated horseshoe tosses with CGA/min A for balance before requesting to lie back down due to increased LBP>dizziness. Pt sat EOB and took a few bites of cereal with set up assist but requested to return to bed due to increased back pain. Pt stated urge to have a BM. Therapist doffed incontinence brief total assist and pt rolled to L and R with supervision for therapist to place bedpan. Pt able to perform bridge to adjust position on bedpan. Pt continent of bowel. Pt required total assist for peri-care. Sacral  dressing soiled with BM, RN made aware and replaced  dressing. PT donned new incontinence brief and pt able to perform bridges to help adjust brief. Pt requested to get dressed and donned pants in supine with mod A for LE management and pull over shirt in supine/sitting with mod A due to increased low back pain while sitting. Pt fatigued at end of session. Concluded session with pt supine in bed, needs within reach, and bed alarm on.   Treatment Session 2: 1115-1158 43 min Received pt on bedpan with NT. Therapist assisted with care and took over while NT got more towels. Pt required total assist for peri care and sacral dressing was soiled again. RN present to replace dressing. PT and NT donned clean incontinence brief. Pt agreeable to therapy and stating "I wish I could just get up out of this bed". Therapist educated pt on bedrest protocols but suggested seated and supine exercises. Pt performed supine chest press with 1lb dowel 2x12 and supine bilateral shoulder flexion with 1lb dowel 2x12 with verbal cues for technique. Pt reported increased LBP pain during some exercises. Repositioning, stretching, and pressure relief with pillows done to alleviate back pain. Pt transferred supine<>sit min A with use of bedrails. Pt performed seated bilateral hip flexion x10 (active assist on LLE due to increased LBP) and knee extension x10. Pt reported increased LBP and requested to lie back down. Pt performed bilateral sidelying clamshells 2x10. Concluded session with pt sidelying in bed with pillows for pressure relief, needs within reach, and bed alarm on.   Therapy Documentation Precautions:  Precautions Precautions: Fall Precaution Comments: Strong L lateral lean, TLSO for mobility Required Braces or Orthoses: Other Brace(TLSO) Spinal Brace: Thoracolumbosacral orthotic Spinal Brace Comments: for comfort with mobility Restrictions Weight Bearing Restrictions: No  Therapy/Group: Individual Therapy Alfonse Alpers PT, DPT   11/21/2019, 7:54 AM

## 2019-11-22 ENCOUNTER — Inpatient Hospital Stay (HOSPITAL_COMMUNITY): Payer: Medicare PPO | Admitting: Physical Therapy

## 2019-11-22 ENCOUNTER — Inpatient Hospital Stay (HOSPITAL_COMMUNITY): Payer: Medicare PPO | Admitting: Speech Pathology

## 2019-11-22 ENCOUNTER — Inpatient Hospital Stay (HOSPITAL_COMMUNITY): Payer: Medicare PPO | Admitting: Occupational Therapy

## 2019-11-22 ENCOUNTER — Inpatient Hospital Stay (HOSPITAL_COMMUNITY): Payer: Medicare PPO

## 2019-11-22 DIAGNOSIS — R791 Abnormal coagulation profile: Secondary | ICD-10-CM

## 2019-11-22 DIAGNOSIS — I82401 Acute embolism and thrombosis of unspecified deep veins of right lower extremity: Secondary | ICD-10-CM

## 2019-11-22 LAB — GLUCOSE, CAPILLARY
Glucose-Capillary: 108 mg/dL — ABNORMAL HIGH (ref 70–99)
Glucose-Capillary: 157 mg/dL — ABNORMAL HIGH (ref 70–99)
Glucose-Capillary: 87 mg/dL (ref 70–99)
Glucose-Capillary: 117 mg/dL — ABNORMAL HIGH (ref 70–99)

## 2019-11-22 LAB — CBC
HCT: 26.6 % — ABNORMAL LOW (ref 36.0–46.0)
Hemoglobin: 8.2 g/dL — ABNORMAL LOW (ref 12.0–15.0)
MCH: 28.3 pg (ref 26.0–34.0)
MCHC: 30.8 g/dL (ref 30.0–36.0)
MCV: 91.7 fL (ref 80.0–100.0)
Platelets: 231 10*3/uL (ref 150–400)
RBC: 2.9 MIL/uL — ABNORMAL LOW (ref 3.87–5.11)
RDW: 18.1 % — ABNORMAL HIGH (ref 11.5–15.5)
WBC: 13.3 10*3/uL — ABNORMAL HIGH (ref 4.0–10.5)
nRBC: 0 % (ref 0.0–0.2)

## 2019-11-22 LAB — PROTIME-INR
INR: 1.7 — ABNORMAL HIGH (ref 0.8–1.2)
Prothrombin Time: 19.7 seconds — ABNORMAL HIGH (ref 11.4–15.2)

## 2019-11-22 MED ORDER — HEPARIN SODIUM (PORCINE) 1000 UNIT/ML DIALYSIS
20.0000 [IU]/kg | INTRAMUSCULAR | Status: DC | PRN
  Filled 2019-11-22: qty 2

## 2019-11-22 MED ORDER — SACCHAROMYCES BOULARDII 250 MG PO CAPS
250.0000 mg | ORAL_CAPSULE | Freq: Two times a day (BID) | ORAL | Status: DC
  Administered 2019-11-22 – 2019-12-05 (×26): 250 mg via ORAL
  Filled 2019-11-22 (×27): qty 1

## 2019-11-22 MED ORDER — APIXABAN 5 MG PO TABS
5.0000 mg | ORAL_TABLET | Freq: Two times a day (BID) | ORAL | Status: DC
  Administered 2019-11-22 – 2019-12-05 (×26): 5 mg via ORAL
  Filled 2019-11-22 (×27): qty 1

## 2019-11-22 MED ORDER — DOXERCALCIFEROL 4 MCG/2ML IV SOLN
INTRAVENOUS | Status: AC
  Administered 2019-11-22: 18:00:00 1.5 ug via INTRAVENOUS
  Filled 2019-11-22: qty 2

## 2019-11-22 MED ORDER — HEPARIN SODIUM (PORCINE) 1000 UNIT/ML IJ SOLN
INTRAMUSCULAR | Status: AC
  Filled 2019-11-22: qty 4

## 2019-11-22 NOTE — Progress Notes (Signed)
Social Work Patient ID: [name], female   DOB: 1951/01/21, 68 y.o.   MRN: [id number]  Met with pt and spoke with son-[name] to inform of team conference progress and hope to reach min level goals and target discharge 1/14. He is aware it is dependent upon her staying medical stable and being able to participate in her therapies. He will be here tomorrow and see her in therapies. He along with her sister will be helping her at home.

## 2019-11-22 NOTE — Progress Notes (Signed)
[facility] Progress Note     Subjective: CC-  States that her hip is sore and she is bored, otherwise no complaints. Denies abdominal pain. Tolerating diet. No n/v. She did have 4-5 loose BMs yesterday. Afebrile. GI plans no intervention and has signed off.  Objective: Vital signs in last 24 hours: Temp:  [98.5 F (36.9 C)-99.2 F (37.3 C)] 98.5 F (36.9 C) (12/30 0521) Pulse Rate:  [86-96] 86 (12/30 0521) Resp:  [16-18] 16 (12/30 0521) BP: (116-141)/(71-79) 116/75 (12/30 0521) SpO2:  [100 %] 100 % (12/30 0521) Weight:  [57.1 kg] 57.1 kg (12/30 0521) Last BM Date: 11/21/19  Intake/Output from previous day: 12/29 0701 - 12/30 0700 In: 100 [IV Piggyback:100] Out: -  Intake/Output this shift: No intake/output data recorded.  PE: Gen: Alert, NAD, pleasant HEENT: EOM's intact, pupils equal and round Card: RRR Pulm: CTAB, no W/R/R, rate and effort normal Abd: Soft,nondistended, nontender, +BS, no HSM, no hernia Skin: no rashes noted, warm and dry   Lab Results:  Recent Labs    11/19/19 1737 11/20/19 1100  WBC 10.9* 11.1*  HGB 8.1* 8.6*  HCT 23.1* 28.3*  PLT 543* 247   BMET Recent Labs    11/20/19 1314  NA 136  K 4.0  CL 99  CO2 24  GLUCOSE 102*  BUN 28*  CREATININE 6.32*  CALCIUM 8.1*   PT/INR Recent Labs    11/21/19 0602 11/22/19 0532  LABPROT 20.6* 19.7*  INR 1.8* 1.7*   CMP     Component Value Date/Time   NA 136 11/20/2019 1314   K 4.0 11/20/2019 1314   CL 99 11/20/2019 1314   CO2 24 11/20/2019 1314   GLUCOSE 102 (H) 11/20/2019 1314   BUN 28 (H) 11/20/2019 1314   CREATININE 6.32 (H) 11/20/2019 1314   CALCIUM 8.1 (L) 11/20/2019 1314   PROT 6.6 11/15/2019 1036   ALBUMIN 1.8 (L) 11/20/2019 1314   AST 20 11/15/2019 1036   ALT 57 (H) 11/15/2019 1036   ALKPHOS 111 11/15/2019 1036   BILITOT 0.4 11/15/2019 1036   GFRNONAA 6 (L) 11/20/2019 1314   GFRAA 7 (L) 11/20/2019 1314   Lipase     Component Value Date/Time   LIPASE  41 09/10/2019 0015       Studies/Results: ECHOCARDIOGRAM LIMITED  Result Date: 11/20/2019   ECHOCARDIOGRAM LIMITED REPORT   Patient Name:   PHRONIA ASCH Date of Exam: 11/20/2019 Medical Rec #:  [id number]        Height:       67.0 in Accession #:    [id number]       Weight:       130.0 lb Date of Birth:  1951-03-22       BSA:          1.68 m Patient Age:    52 years         BP:           150/88 mmHg Patient Gender: F                HR:           82 bpm. Exam Location:  Inpatient  Procedure: Limited Echo, 3D Echo, Cardiac Doppler and Color Doppler Indications:    Endocarditis of mitral valve.  History:        Patient has prior history of Echocardiogram examinations, most                 recent 10/26/2019. ESRD. Endocarditis  of the anterior leaflet of                 the mitral valve.  Sonographer:    Roseanna Rainbow RDCS Referring Phys: [place] TRIGT  Sonographer Comments: Technically difficult study due to poor echo windows. IMPRESSIONS  1. Left ventricular ejection fraction, by visual estimation, is 55 to 60%. The left ventricle has normal function. Left ventricular septal wall thickness was severely increased. Moderately increased left ventricular posterior wall thickness.  2. Elevated left atrial and left ventricular end-diastolic pressures.  3. Left ventricular diastolic parameters are consistent with Grade I diastolic dysfunction (impaired relaxation).  4. Moderate thickening of the anterior mitral valve leaflet(s).  5. Moderate vegetation on the mitral valve.  6. The mitral valve is abnormal. Moderate to severe mitral valve regurgitation.  7. Tricuspid valve regurgitation moderate.  8. Tricuspid valve regurgitation moderate.  9. Moderate thickening. 10. Normal pulmonary artery systolic pressure. 11. A prior study was performed on 10/26/2019. 12. Changes from prior study are noted. 13. Compared to the prior TEE, moderate AMVL endocarditis persists with a mobile mass noted on the anterior leaflet  ~0.5 cm in diameter, may be vegetation or ruptured apparatus. There is moderate to severe, posteriorly directed MR. Clinical correlation with CHF symptoms is recommended. FINDINGS  Left Ventricle: Left ventricular ejection fraction, by visual estimation, is 55 to 60%. The left ventricle has normal function. Left ventricular septal wall thickness was severely increased. Moderately increased left ventricular posterior wall thickness. Left ventricular diastolic parameters are consistent with Grade I diastolic dysfunction (impaired relaxation). Elevated left atrial and left ventricular end-diastolic pressures. Right Ventricle: The tricuspid regurgitant velocity is 2.16 m/s, and with an assumed right atrial pressure of 3 mmHg, the estimated right ventricular systolic pressure is normal at 21.7 mmHg. Mitral Valve: The mitral valve is abnormal. There is moderate thickening of the anterior mitral valve leaflet(s). A moderate vegetation is seen on the anterior mitral leaflet. The MV vegetation measures 5 mm x 5 mm. MV Area by PHT, 3.99 cm. MV PHT, 55.10 msec. Moderate to severe mitral valve regurgitation, with posteriorly-directed jet. Tricuspid Valve: Tricuspid valve regurgitation moderate. Aortic Valve: The aortic valve is tricuspid. Moderate thickening. Venous: The inferior vena cava was not well visualized. Additional Comments: A prior study was performed on 10/26/2019.  LEFT VENTRICLE          Normals PLAX 2D LVIDd:         2.80 cm  3.6 cm LVIDs:         2.10 cm  1.7 cm LV PW:         1.60 cm  1.4 cm LV IVS:        1.60 cm  1.3 cm LVOT diam:     1.80 cm  2.0 cm LV SV:         15 ml    79 ml LV SV Index:   9.09     45 ml/m2 LVOT Area:     2.54 cm 3.14 cm2  LV Volumes (MOD)             Normals LV area d, A4C:    18.70 cm LV area s, A4C:    10.20 cm LV major d, A4C:   6.12 cm LV major s, A4C:   4.90 cm LV vol d, MOD A4C: 45.5 ml LV vol s, MOD A4C: 17.7 ml LV SV MOD A4C:     45.5 ml LEFT ATRIUM  Index LA diam:     4.00 cm 2.38 cm/m  AORTIC VALVE             Normals LVOT Vmax:   110.00 cm/s LVOT Vmean:  79.400 cm/s 75 cm/s LVOT VTI:    0.220 m     25.3 cm  AORTA                 Normals Ao Root diam: 3.00 cm 31 mm MITRAL VALVE              Normals   TRICUSPID VALVE             Normals MV Area (PHT): 3.99 cm             TR Peak grad:   18.7 mmHg MV PHT:        55.10 msec 55 ms     TR Vmax:        216.00 cm/s 288 cm/s MV Decel Time: 190 msec   187 ms MV E velocity: 85.30 cm/s 103 cm/s  SHUNTS MV A velocity: 99.50 cm/s 70.3 cm/s Systemic VTI:  0.22 m MV E/A ratio:  0.86       1.5       Systemic Diam: 1.80 cm  Lyman Bishop MD Electronically signed by Lyman Bishop MD Signature Date/Time: 11/20/2019/10:54:30 AMThe mitral valve is abnormal.    Final     Anti-infectives: Anti-infectives (From admission, onward)   Start     Dose/Rate Route Frequency Ordered Stop   11/18/19 1700  ciprofloxacin (CIPRO) tablet 500 mg     500 mg Oral Every 24 hours 11/18/19 1619 12/06/19 2359   11/17/19 2000  cefTAZidime (FORTAZ) 1 g in sodium chloride 0.9 % 100 mL IVPB     1 g 200 mL/hr over 30 Minutes Intravenous Every 24 hours 11/17/19 1452 12/06/19 2359   11/08/19 1400  fluconazole (DIFLUCAN) tablet 100 mg  Status:  Discontinued     100 mg Oral Every 24 hours 11/08/19 1059 11/09/19 1101   11/01/19 2000  ceFEPIme (MAXIPIME) 1 g in sodium chloride 0.9 % 100 mL IVPB  Status:  Discontinued     1 g 200 mL/hr over 30 Minutes Intravenous Every 24 hours 11/01/19 1715 11/17/19 1422   11/01/19 1800  ciprofloxacin (CIPRO) tablet 500 mg  Status:  Discontinued     500 mg Oral Daily-1800 11/01/19 1715 11/07/19 0914       Assessment/Plan HTN DM ESRD on HD HLD Endocarditis of MV Chronic anticoagulation on coumadin - INR2.1today Hepatic encephalopathy Chronic anemia Recent CVAcurrently admitted to inpt rehab for post-stroke rehabilitation  Destruction of the L3 and L4 vertebral bodies- NS following  Ascending colon  pneumatosis -seen on CT12/24, no free air,she could have underlying Ogilvie's given its dilation on the right and gradual tapering down in the left - GIconsulted and recommends conservative management, no indication for colonoscopy at this time  ID -per ID FEN -reg diet VTE -coumadin Foley -none Follow up -TBD  Plan: Abdominal exam remains benign, patient nontoxic, tolerating diet and having bowel function. I added a probiotic. No role for surgery. Would recommend outpatient follow up with GI for possible colonoscopy in the future. General surgery will sign off, please call with any concerns.   LOS: 21 days    Wellington Hampshire, [facility] 11/22/2019, 9:39 AM Please see Amion for pager number during day hours 7:00am-4:30pm

## 2019-11-22 NOTE — Progress Notes (Signed)
Speech Language Pathology Daily Session Note  Patient Details  Name: [name] MRN: [id number] Date of Birth: [birth year]  Today's Date: 11/22/2019 SLP Individual Time: 0930-1025 SLP Individual Time Calculation (min): 55 min  Short Term Goals: Week 3: SLP Short Term Goal 1 (Week 3): Pt will demonstrate functional problem solving during basic tasks with Mod A verbal/visual cues SLP Short Term Goal 2 (Week 3): Pt will demonstrate recall of day to day/new information with Mod A verbal cues for use of compensatory memory strategies SLP Short Term Goal 3 (Week 3): Pt will sustain attention to functional tasks with Min A verbal cues for redirection SLP Short Term Goal 4 (Week 3): Pt will detect and correct errors during functional tasks with Mod A verbal/visual cues. SLP Short Term Goal 5 (Week 3): Pt will initiate during functional tasks with Mod A verbal/visual cues from clinician.  Skilled Therapeutic Interventions: Pt was seen for skilled ST targeting cognition. Upon arrival, pt requested set up assistance with breakfast; she was also agreeable to assume upright positioning in bed for optimal safety during PO intake. Pt consumed ~90% of her cereal and milk. SLP further facilitated session with a basic medication management task from the AFLA, targeting sustained attention and problem solving. Pt required extra time and Min A verbal cues in order to identify specific information on medication labels, as well as to indicate placement of pills on a QID pill chart (vebally and by pointing). Pt also required Min-Mod A verbal cues for problem solving and error awareness when completing semi-complex daily functional math problems from the ALFA. In functional conversation regarding discharge, pt demonstrated good safety awareness (ex: need for AD in order to walk, having someone there 24/7), however Mod A verbal cues were required for her to identify 1 physical and 1 cognitive impairment/activity she  would need assistance with upon her return home. SLP discussed impact of pt's deficits in short term memory, attention, and problem solving that will impact her daily living and discussed more activities she would need assistance with (cooking, medications, etc.). Pt was left in bed with alarm set, all needs within reach. Continue per current plan of care.       Pain Pain Assessment Pain Scale: Faces Pain Score: 0-No pain Faces Pain Scale: Hurts little more Pain Type: Acute pain Pain Location: Sacrum Pain Orientation: Lower;Medial Pain Descriptors / Indicators: Aching Pain Onset: On-going Pain Intervention(s): Emotional support;Repositioned Multiple Pain Sites: No  Therapy/Group: Individual Therapy  Arbutus Leas 11/22/2019, 10:48 AM

## 2019-11-22 NOTE — Progress Notes (Signed)
[facility] PHYSICAL MEDICINE & REHABILITATION PROGRESS NOTE   Subjective/Complaints: Patient seen laying in bed this morning.  She states she slept well overnight.  She has questions regarding timing of dialysis.  She is about to work with therapies.  Discussed ADLs with therapies.  She was seen yesterday by hospitalist, nephrology, GI.  Discussed with hospitalist activity.  ROS: Denies CP, SOB, N/V/D  Objective:   ECHOCARDIOGRAM LIMITED  Result Date: 11/20/2019   ECHOCARDIOGRAM LIMITED REPORT   Patient Name:   [name] Date of Exam: 11/20/2019 Medical Rec #:  [id number]        Height:       67.0 in Accession #:    [id number]       Weight:       130.0 lb Date of Birth:  02/21/1951       BSA:          1.68 m Patient Age:    68 years         BP:           150/88 mmHg Patient Gender: F                HR:           82 bpm. Exam Location:  Inpatient  Procedure: Limited Echo, 3D Echo, Cardiac Doppler and Color Doppler Indications:    Endocarditis of mitral valve.  History:        Patient has prior history of Echocardiogram examinations, most                 recent 10/26/2019. ESRD. Endocarditis of the anterior leaflet of                 the mitral valve.  Sonographer:    Roseanna Rainbow RDCS Referring Phys: [place] TRIGT  Sonographer Comments: Technically difficult study due to poor echo windows. IMPRESSIONS  1. Left ventricular ejection fraction, by visual estimation, is 55 to 60%. The left ventricle has normal function. Left ventricular septal wall thickness was severely increased. Moderately increased left ventricular posterior wall thickness.  2. Elevated left atrial and left ventricular end-diastolic pressures.  3. Left ventricular diastolic parameters are consistent with Grade I diastolic dysfunction (impaired relaxation).  4. Moderate thickening of the anterior mitral valve leaflet(s).  5. Moderate vegetation on the mitral valve.  6. The mitral valve is abnormal. Moderate to severe mitral  valve regurgitation.  7. Tricuspid valve regurgitation moderate.  8. Tricuspid valve regurgitation moderate.  9. Moderate thickening. 10. Normal pulmonary artery systolic pressure. 11. A prior study was performed on 10/26/2019. 12. Changes from prior study are noted. 13. Compared to the prior TEE, moderate AMVL endocarditis persists with a mobile mass noted on the anterior leaflet ~0.5 cm in diameter, may be vegetation or ruptured apparatus. There is moderate to severe, posteriorly directed MR. Clinical correlation with CHF symptoms is recommended. FINDINGS  Left Ventricle: Left ventricular ejection fraction, by visual estimation, is 55 to 60%. The left ventricle has normal function. Left ventricular septal wall thickness was severely increased. Moderately increased left ventricular posterior wall thickness. Left ventricular diastolic parameters are consistent with Grade I diastolic dysfunction (impaired relaxation). Elevated left atrial and left ventricular end-diastolic pressures. Right Ventricle: The tricuspid regurgitant velocity is 2.16 m/s, and with an assumed right atrial pressure of 3 mmHg, the estimated right ventricular systolic pressure is normal at 21.7 mmHg. Mitral Valve: The mitral valve is abnormal. There is moderate thickening of the anterior  mitral valve leaflet(s). A moderate vegetation is seen on the anterior mitral leaflet. The MV vegetation measures 5 mm x 5 mm. MV Area by PHT, 3.99 cm. MV PHT, 55.10 msec. Moderate to severe mitral valve regurgitation, with posteriorly-directed jet. Tricuspid Valve: Tricuspid valve regurgitation moderate. Aortic Valve: The aortic valve is tricuspid. Moderate thickening. Venous: The inferior vena cava was not well visualized. Additional Comments: A prior study was performed on 10/26/2019.  LEFT VENTRICLE          Normals PLAX 2D LVIDd:         2.80 cm  3.6 cm LVIDs:         2.10 cm  1.7 cm LV PW:         1.60 cm  1.4 cm LV IVS:        1.60 cm  1.3 cm LVOT diam:      1.80 cm  2.0 cm LV SV:         15 ml    79 ml LV SV Index:   9.09     45 ml/m2 LVOT Area:     2.54 cm 3.14 cm2  LV Volumes (MOD)             Normals LV area d, A4C:    18.70 cm LV area s, A4C:    10.20 cm LV major d, A4C:   6.12 cm LV major s, A4C:   4.90 cm LV vol d, MOD A4C: 45.5 ml LV vol s, MOD A4C: 17.7 ml LV SV MOD A4C:     45.5 ml LEFT ATRIUM         Index LA diam:    4.00 cm 2.38 cm/m  AORTIC VALVE             Normals LVOT Vmax:   110.00 cm/s LVOT Vmean:  79.400 cm/s 75 cm/s LVOT VTI:    0.220 m     25.3 cm  AORTA                 Normals Ao Root diam: 3.00 cm 31 mm MITRAL VALVE              Normals   TRICUSPID VALVE             Normals MV Area (PHT): 3.99 cm             TR Peak grad:   18.7 mmHg MV PHT:        55.10 msec 55 ms     TR Vmax:        216.00 cm/s 288 cm/s MV Decel Time: 190 msec   187 ms MV E velocity: 85.30 cm/s 103 cm/s  SHUNTS MV A velocity: 99.50 cm/s 70.3 cm/s Systemic VTI:  0.22 m MV E/A ratio:  0.86       1.5       Systemic Diam: 1.80 cm  Lyman Bishop MD Electronically signed by Lyman Bishop MD Signature Date/Time: 11/20/2019/10:54:30 AMThe mitral valve is abnormal.    Final    Recent Labs    11/19/19 1737 11/20/19 1100  WBC 10.9* 11.1*  HGB 8.1* 8.6*  HCT 23.1* 28.3*  PLT 543* 247   Recent Labs    11/20/19 1314  NA 136  K 4.0  CL 99  CO2 24  GLUCOSE 102*  BUN 28*  CREATININE 6.32*  CALCIUM 8.1*    Intake/Output Summary (Last 24 hours) at 11/22/2019 [id number] Last data filed at 11/21/2019 2023 Gross  per 24 hour  Intake 100 ml  Output --  Net 100 ml     Physical Exam: Vital Signs Blood pressure 116/75, pulse 86, temperature 98.5 F (36.9 C), temperature source Oral, resp. rate 16, height 5\' 7"  (1.702 m), weight 57.1 kg, SpO2 100 %.  Constitutional: No distress . Vital signs reviewed. HENT: Normocephalic.  Atraumatic. Eyes: EOMI. No discharge. Cardiovascular: No JVD. Respiratory: Normal effort.  No stridor. GI: Non-distended. Skin: Sacral ulcer  not examined today. Psych: Flat. Musc: No edema in extremities.  No tenderness in extremities. Neuro: Alert and oriented x3 Motor:  RUE: 5/5 proximal to distal RLE: HF, KE 4 -/5, ADF 4/5 LUE: 4+/5 proximal to distal LLE: HF, KE 4-/5, ADF 4/5  Assessment/Plan: 1. Functional deficits secondary to right basal ganglia stroke which require 3+ hours per day of interdisciplinary therapy in a comprehensive inpatient rehab setting.  Physiatrist is providing close team supervision and 24 hour management of active medical problems listed below.  Physiatrist and rehab team continue to assess barriers to discharge/monitor patient progress toward functional and medical goals  Care Tool:  Bathing    Body parts bathed by patient: Right arm, Left arm, Chest, Abdomen, Front perineal area, Right upper leg, Left upper leg, Face   Body parts bathed by helper: Right arm, Left arm, Chest, Abdomen, Front perineal area, Buttocks, Right upper leg, Left upper leg, Right lower leg, Left lower leg     Bathing assist Assist Level: Total Assistance - Patient < 25%     Upper Body Dressing/Undressing Upper body dressing   What is the patient wearing?: Pull over shirt    Upper body assist Assist Level: Moderate Assistance - Patient 50 - 74%    Lower Body Dressing/Undressing Lower body dressing      What is the patient wearing?: Incontinence brief     Lower body assist Assist for lower body dressing: Total Assistance - Patient < 25%     Toileting Toileting    Toileting assist Assist for toileting: Maximal Assistance - Patient 25 - 49%     Transfers Chair/bed transfer  Transfers assist     Chair/bed transfer assist level: Moderate Assistance - Patient 50 - 74%     Locomotion Ambulation   Ambulation assist   Ambulation activity did not occur: Safety/medical concerns(back pain, bilateral LE weakness, poor bilateral knee control)  Assist level: 2 helpers Assistive device:  Walker-rolling Max distance: 6ft   Walk 10 feet activity   Assist  Walk 10 feet activity did not occur: Safety/medical concerns(back pain, bilateral LE weakness, poor bilateral knee control)  Assist level: 2 helpers Assistive device: Walker-rolling   Walk 50 feet activity   Assist Walk 50 feet with 2 turns activity did not occur: Safety/medical concerns(back pain, bilateral LE weakness, poor bilateral knee control)  Assist level: 2 helpers Assistive device: Walker-rolling    Walk 150 feet activity   Assist Walk 150 feet activity did not occur: Safety/medical concerns(back pain, bilateral LE weakness, poor bilateral knee control)         Walk 10 feet on uneven surface  activity   Assist Walk 10 feet on uneven surfaces activity did not occur: Safety/medical concerns(back pain, bilateral LE weakness, poor bilateral knee control)         Wheelchair     Assist Will patient use wheelchair at discharge?: Yes Type of Wheelchair: Manual Wheelchair activity did not occur: Safety/medical concerns(Pt could not tolerate sitting in WC for long enough to complete activity due  to low back pain)  Wheelchair assist level: Minimal Assistance - Patient > 75% Max wheelchair distance: 14ft    Wheelchair 50 feet with 2 turns activity    Assist    Wheelchair 50 feet with 2 turns activity did not occur: Safety/medical concerns(Pt could not tolerate sitting in [facility] for long enough to complete activity due to low back pain)       Wheelchair 150 feet activity     Assist  Wheelchair 150 feet activity did not occur: Safety/medical concerns(Pt could not tolerate sitting in [facility] for long enough to complete activity due to low back pain)       Blood pressure 116/75, pulse 86, temperature 98.5 F (36.9 C), temperature source Oral, resp. rate 16, height 5\' 7"  (1.702 m), weight 57.1 kg, SpO2 100 %.    Medical Problem List and Plan: 1.  Impaired gait with left side weakness  secondary to right basal ganglia infarction as well as small cortical infarct left frontal lobe  CT head on 12/13 personally reviewed, showing some improvement, MRI personally reviewed, stable. Repeat MRI ordered again by ID, showing stable infarcts  Chest x-ray personally reviewed, unremarkable for infection  Discussed with neurology results of the EEG -findings possibly related to uremia +/- cefepime  Discussed with psychiatry, continue medical management  Discussed with hospitalist, appreciate recs, discussed with hospitalist again on 12/25  Continue CIR  Team conference today to discuss current and goals and coordination of care, home and environmental barriers, and discharge planning with nursing, case manager, and therapies.  2.  Antithrombotics: -DVT/anticoagulation: Chronic Coumadin for right IJ acute DVT.  Plan anticoagulation x3 months  INR 1.7 on 12/30, trending down, discussed anticoag options.  Will transition to Eliquis as it has been very difficult to manage INR, will also order repeat U/S             -antiplatelet therapy: N/A 3. Pain Management chronic back and left hip pain: Neurontin 100 mg on hold  TLSO back brace for comfort  Lidocaine patch added to coccyx  Relatively controlled on 12/30 4. Mood: Provide emotional support             -antipsychotic agents: N/A 5. Neuropsych: This patient is not capable of making decisions on her own behalf. 6. Skin/Wound Care: Routine skin checks  Santyl to sacral wound 7. Fluids/Electrolytes/Nutrition: Routine in and outs 8.  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  New HD tunneled catheter placed 10/26/2019.  Repeat echo showing increase in mitral regurg  CT surgery recommending mitral valve replacement- will follow as outpatient. 9.  ID/discitis/recurrent Pseudomonas bacteremia.    Cipro/Maxipime changed to ceftazidime/Cipro through 12/06/2019.  Seen  by neurosurgery on 12/25, no surgical intervention recommended at this point, however recommended reconsulting ID  Appreciate ID recs  10.  Hemodialysis MWF.  Follow-up per renal services.  11.  Diabetes mellitus.  Hemoglobin A1c 7.6.  SSI. CBG (last 3)  Recent Labs    11/21/19 1657 11/21/19 2139 11/22/19 0626  GLUCAP 159* 96 108*   Labile on 12/30 12.  Hypertension.  Imdur 30 mg daily  Coreg 6.25 mg twice daily, increased to 12.5 on 12/15, parameters placed.    Monitor with increased mobility.  Relatively controlled on 12/30 13.  Acute on chronic anemia.    Hemoglobin 8.6 on 12/28  Continue to monitor 14. Constipation:  See #16  Abd xray showing?  Volvulus  CT abdomen/pelvis showing possible Ogilvie's  Improving  15.  Candidiasis  Diflucan ordered on 12/16  Repeat Ucx not collected yet on 12/29 16.  Hyperammonemia with hepatic encephalopathy  Lactulose started on 12/17, encourage compliance  Ammonia WNL on 12/22 17. Sleep disturance  Melatonin added on 12/17  Sleep chart ordered  Improving 18.  Leukocytosis  WBCs 11.1 on 12/28, labs with HD  Afebrile  Continue to monitor 19.  GERD  Home Protonix ordered on 12/20 20.  Pneumatosis Coli  CT abdomen/pelvis on 12/24?  Obese.  Appreciate surgery following-recommending GI recs, no surgical intervention  Appreciate GI recs, advising against colonoscopy and recommending conservative care at present with repeat imaging in 1-3 months..   LOS: 21 days A FACE TO FACE EVALUATION WAS PERFORMED  Hether Anselmo Lorie Phenix 11/22/2019, 9:38 AM

## 2019-11-22 NOTE — Discharge Instructions (Addendum)
Inpatient Rehab Discharge Instructions  [name] Discharge date and time: No discharge date for patient encounter.   Activities/Precautions/ Functional Status: Activity: activity as tolerated Diet: regular diet Wound Care: none needed Functional status:  ___ No restrictions     ___ Walk up steps independently ___ 24/7 supervision/assistance   ___ Walk up steps with assistance ___ Intermittent supervision/assistance  ___ Bathe/dress independently ___ Walk with walker     _x__ Bathe/dress with assistance ___ Walk Independently    ___ Shower independently ___ Walk with assistance    ___ Shower with assistance ___ No alcohol     ___ Return to work/school ________   COMMUNITY REFERRALS UPON DISCHARGE:    Home Health:   PT     OT      RN                      Agency:  [facility]  Phone: [phone]    Medical Equipment/Items Ordered: wheelchair, cushion, commode and tub seat                                                      Agency/Supplier:  [employer]        Special Instructions: No driving smoking or alcohol  Home health nurse Santyl ointment applied to buttocks wound daily and cover with moist gauze and foam dressing change foam dressing every 3 days or as needed soiling  Continue hemodialysis as directed      STROKE/TIA DISCHARGE INSTRUCTIONS SMOKING Cigarette smoking nearly doubles your risk of having a stroke & is the single most alterable risk factor  If you smoke or have smoked in the last 12 months, you are advised to quit smoking for your health.  Most of the excess cardiovascular risk related to smoking disappears within a year of stopping.  Ask you doctor about anti-smoking medications  [place] Quit Line: 1-800-QUIT NOW  Free Smoking Cessation Classes [phone]  CHOLESTEROL Know your levels; limit fat & cholesterol in your diet  Lipid Panel     Component Value Date/Time   CHOL 150 10/21/2019 0405   TRIG 192 (H) 10/21/2019  0405   HDL 47 10/21/2019 0405   CHOLHDL 3.2 10/21/2019 0405   VLDL 38 10/21/2019 0405   LDLCALC 65 10/21/2019 0405      Many patients benefit from treatment even if their cholesterol is at goal.  Goal: Total Cholesterol (CHOL) less than 160  Goal:  Triglycerides (TRIG) less than 150  Goal:  HDL greater than 40  Goal:  LDL (LDLCALC) less than 100   BLOOD PRESSURE American Stroke Association blood pressure target is less that 120/80 mm/Hg  Your discharge blood pressure is:  BP: (!) 161/83  Monitor your blood pressure  Limit your salt and alcohol intake  Many individuals will require more than one medication for high blood pressure  DIABETES (A1c is a blood sugar average for last 3 months) Goal HGBA1c is under 7% (HBGA1c is blood sugar average for last 3 months)  Diabetes:    Lab Results  Component Value Date   HGBA1C 7.6 (H) 10/21/2019     Your HGBA1c can be lowered with medications, healthy diet, and exercise.  Check your blood sugar as directed by your physician  Call your physician if you experience unexplained or  low blood sugars.  PHYSICAL ACTIVITY/REHABILITATION Goal is 30 minutes at least 4 days per week  Activity: Increase activity slowly, Therapies: Physical Therapy: Home Health Return to work:   Activity decreases your risk of heart attack and stroke and makes your heart stronger.  It helps control your weight and blood pressure; helps you relax and can improve your mood.  Participate in a regular exercise program.  Talk with your doctor about the best form of exercise for you (dancing, walking, swimming, cycling).  DIET/WEIGHT Goal is to maintain a healthy weight  Your discharge diet is:  Diet Order            Diet regular Room service appropriate? Yes; Fluid consistency: Thin  Diet effective now              liquids Your height is:  Height: 5\' 7"  (170.2 cm) Your current weight is: Weight: 64.2 kg Your Body Mass Index (BMI) is:  BMI (Calculated):  22.16  Following the type of diet specifically designed for you will help prevent another stroke.  Your goal weight range is:    Your goal Body Mass Index (BMI) is 19-24.  Healthy food habits can help reduce 3 risk factors for stroke:  High cholesterol, hypertension, and excess weight.  RESOURCES Stroke/Support Group:  Call [phone]   STROKE EDUCATION PROVIDED/REVIEWED AND GIVEN TO PATIENT Stroke warning signs and symptoms How to activate emergency medical system (call 911). Medications prescribed at discharge. Need for follow-up after discharge. Personal risk factors for stroke. Pneumonia vaccine given:  Flu vaccine given:  My questions have been answered, the writing is legible, and I understand these instructions.  I will adhere to these goals & educational materials that have been provided to me after my discharge from the hospital.      My questions have been answered and I understand these instructions. I will adhere to these goals and the provided educational materials after my discharge from the hospital.  Patient/Caregiver Signature _______________________________ Date __________  Clinician Signature _______________________________________ Date __________  Please bring this form and your medication list with you to all your follow-up doctor's appointments.   ------------------------------------------------------------------------------------------------------- Information on my medicine - ELIQUIS (apixaban)  This medication education was reviewed with me or my healthcare representative as part of my discharge preparation.  The pharmacist that spoke with me during my hospital stay was:  Donnamae Jude, [facility]  Why was Eliquis prescribed for you? Eliquis was prescribed to treat blood clots that may have been found in the veins of your legs (deep vein thrombosis) or in your lungs (pulmonary embolism) and to reduce the risk of them occurring again.  What do You need to  know about Eliquis ? The dose is ONE 5 mg tablet taken TWICE daily.  Eliquis may be taken with or without food.   Try to take the dose about the same time in the morning and in the evening. If you have difficulty swallowing the tablet whole please discuss with your pharmacist how to take the medication safely.  Take Eliquis exactly as prescribed and DO NOT stop taking Eliquis without talking to the doctor who prescribed the medication.  Stopping may increase your risk of developing a new blood clot.  Refill your prescription before you run out.  After discharge, you should have regular check-up appointments with your healthcare provider that is prescribing your Eliquis.    What do you do if you miss a dose? If a dose of ELIQUIS is not  taken at the scheduled time, take it as soon as possible on the same day and twice-daily administration should be resumed. The dose should not be doubled to make up for a missed dose.  Important Safety Information A possible side effect of Eliquis is bleeding. You should call your healthcare provider right away if you experience any of the following: ? Bleeding from an injury or your nose that does not stop. ? Unusual colored urine (red or dark brown) or unusual colored stools (red or black). ? Unusual bruising for unknown reasons. ? A serious fall or if you hit your head (even if there is no bleeding).  Some medicines may interact with Eliquis and might increase your risk of bleeding or clotting while on Eliquis. To help avoid this, consult your healthcare provider or pharmacist prior to using any new prescription or non-prescription medications, including herbals, vitamins, non-steroidal anti-inflammatory drugs (NSAIDs) and supplements.  This website has more information on Eliquis (apixaban): http://www.eliquis.com/eliquis/home  ----------------------------------------------------------------------------------------------------------------- Deep  Vein Thrombosis   Deep vein thrombosis (DVT) is a condition in which a blood clot forms in a deep vein, such as a lower leg, thigh, or arm vein. A clot is blood that has thickened into a gel or solid. This condition is dangerous. It can lead to serious and even life-threatening complications if the clot travels to the lungs and causes a blockage (pulmonary embolism). It can also damage veins in the leg. This can result in leg pain, swelling, discoloration, and sores (post-thrombotic syndrome). What are the causes? This condition may be caused by:  A slowdown of blood flow.  Damage to a vein.  A condition that causes blood to clot more easily, such as an inherited clotting disorder. What increases the risk? The following factors may make you more likely to develop this condition:  Being overweight.  Being older, especially over age 26.  Sitting or lying down for more than four hours.  Being in the hospital.  Lack of physical activity (sedentary lifestyle).  Pregnancy, being in childbirth, or having recently given birth.  Taking medicines that contain estrogen, such as medicines to prevent pregnancy.  Smoking.  A history of any of the following: ? Blood clots or a blood clotting disease. ? Peripheral vascular disease. ? Inflammatory bowel disease. ? Cancer. ? Heart disease. ? Genetic conditions that affect how your blood clots, such as Factor V Leiden mutation. ? Neurological diseases that affect your legs (leg paresis). ? A recent injury, such as a car accident. ? Major or lengthy surgery. ? A central line placed inside a large vein. What are the signs or symptoms? Symptoms of this condition include:  Swelling, pain, or tenderness in an arm or leg.  Warmth, redness, or discoloration in an arm or leg. If the clot is in your leg, symptoms may be more noticeable or worse when you stand or walk. Some people may not develop any symptoms. How is this diagnosed? This  condition is diagnosed with:  A medical history and physical exam.  Tests, such as: ? Blood tests. These are done to check how well your blood clots. ? Ultrasound. This is done to check for clots. ? Venogram. For this test, contrast dye is injected into a vein and X-rays are taken to check for any clots. How is this treated? Treatment for this condition depends on:  The cause of your DVT.  Your risk for bleeding or developing more clots.  Any other medical conditions that you have. Treatment may  include:  Taking a blood thinner (anticoagulant). This type of medicine prevents clots from forming. It may be taken by mouth, injected under the skin, or injected through an IV (catheter).  Injecting clot-dissolving medicines into the affected vein (catheter-directed thrombolysis).  Having surgery. Surgery may be done to: ? Remove the clot. ? Place a filter in a large vein to catch blood clots before they reach the lungs. Some treatments may be continued for up to six months. Follow these instructions at home: If you are taking blood thinners:  Take the medicine exactly as told by your health care provider. Some blood thinners need to be taken at the same time every day. Do not skip a dose.  Talk with your health care provider before you take any medicines that contain aspirin or NSAIDs. These medicines increase your risk for dangerous bleeding.  Ask your health care provider about foods and drugs that could change the way the medicine works (may interact). Avoid those things if your health care provider tells you to do so.  Blood thinners can cause easy bruising and may make it difficult to stop bleeding. Because of this: ? Be very careful when using knives, scissors, or other sharp objects. ? Use an electric razor instead of a blade. ? Avoid activities that could cause injury or bruising, and follow instructions about how to prevent falls.  Wear a medical alert bracelet or carry a  card that lists what medicines you take. General instructions  Take over-the-counter and prescription medicines only as told by your health care provider.  Return to your normal activities as told by your health care provider. Ask your health care provider what activities are safe for you.  Wear compression stockings if recommended by your health care provider.  Keep all follow-up visits as told by your health care provider. This is important. How is this prevented? To lower your risk of developing this condition again:  For 30 or more minutes every day, do an activity that: ? Involves moving your arms and legs. ? Increases your heart rate.  When traveling for longer than four hours: ? Exercise your arms and legs every hour. ? Drink plenty of water. ? Avoid drinking alcohol.  Avoid sitting or lying for a long time without moving your legs.  If you have surgery or you are hospitalized, ask about ways to prevent blood clots. These may include taking frequent walks or using anticoagulants.  Stay at a healthy weight.  If you are a woman who is older than age 39, avoid unnecessary use of medicines that contain estrogen, such as some birth control pills.  Do not use any products that contain nicotine or tobacco, such as cigarettes and e-cigarettes. This is especially important if you take estrogen medicines. If you need help quitting, ask your health care provider. Contact a health care provider if:  You miss a dose of your blood thinner.  Your menstrual period is heavier than usual.  You have unusual bruising. Get help right away if:  You have: ? New or increased pain, swelling, or redness in an arm or leg. ? Numbness or tingling in an arm or leg. ? Shortness of breath. ? Chest pain. ? A rapid or irregular heartbeat. ? A severe headache or confusion. ? A cut that will not stop bleeding.  There is blood in your vomit, stool, or urine.  You have a serious fall or accident,  or you hit your head.  You feel light-headed or dizzy.  You cough up blood. These symptoms may represent a serious problem that is an emergency. Do not wait to see if the symptoms will go away. Get medical help right away. Call your local emergency services (911 in the [place] Do not drive yourself to the hospital. Summary  Deep vein thrombosis (DVT) is a condition in which a blood clot forms in a deep vein, such as a lower leg, thigh, or arm vein.  Symptoms can include swelling, warmth, pain, and redness in your leg or arm.  This condition may be treated with a blood thinner (anticoagulant medicine), medicine that is injected to dissolve blood clots,compression stockings, or surgery.  If you are prescribed blood thinners, take them exactly as told. This information is not intended to replace advice given to you by your health care provider. Make sure you discuss any questions you have with your health care provider. Document Released: 11/09/2005 Document Revised: 10/22/2017 Document Reviewed: 04/09/2017 Elsevier Patient Education  2020 [employer].

## 2019-11-22 NOTE — Progress Notes (Signed)
Physical Therapy Session Note  Patient Details  Name: [name] MRN: [id number] Date of Birth: 15-Nov-1951  Today's Date: 11/22/2019 PT Individual Time: 1030-1125 PT Individual Time Calculation (min): 55 min   Short Term Goals: Week 3:  PT Short Term Goal 1 (Week 3): Pt will transfer supine<>sit and sit<>supine with min A PT Short Term Goal 1 - Progress (Week 3): Met PT Short Term Goal 2 (Week 3): Pt will transfer stand<>pivot with LRAD mod A PT Short Term Goal 2 - Progress (Week 3): Not progressing PT Short Term Goal 3 (Week 3): Pt will perform car transfer with LRAD mod A PT Short Term Goal 3 - Progress (Week 3): Not progressing Week 4:  PT Short Term Goal 1 (Week 4): Pt will transfer supine<>sit and sit<>supine with CGA PT Short Term Goal 2 (Week 4): Pt will transfer stand<>pivot with LRAD mod A PT Short Term Goal 3 (Week 4): Pt will perform car transfer with LRAD mod A  Skilled Therapeutic Interventions/Progress Updates:   Received pt supine in bed, pt agreeable to therapy, and stated pain in R hip and low back due requested to wait on pain medication because she received some this morning. Session focused on functional mobility/transfers, ambulation, LE strength, balance, and improved endurance with activity. Pt performed bed mobility with min A. Pt transferred squat<>pivot bed<>WC mod A without AD. PT donned TLSO total assist for pt comfort. Pt performed WC mobility 75f min A with L sided hand over hand manual cueing. Pt reported fatigue and requested to stop activity. Pt ambulated 182fx1 and 1759f 1 with RW mod A +2 for WC follow. Pt demonstrated bilateral knee flexion, decreased trunk rotation, and decreased stride length. Pt required verbal cues to remain upright, for hand placement [facility]. Pt limited by increase in bilateral hip and back pain. PT provided hot packs to patient for hip pain and pt reported relief. Pt c/o of increased  pain in buttocks from cushion in WC. PT provided pt with new cushion for improved pressure relief, pt verbalized increased comfort. Pt requested to return to room and lay down. RN made aware and administered pain medication at end of session. Pt agreeable to bed level exercises and performed L sidelying R hip abduction 2x10, R sidelying hip abduction 2x10, and bilateral sidelying clamshells 2x10. Concluded session with pt sidelying in bed, PT provided pillows for pressure relief and repositioning, needs within reach, and bed alarm on.   Therapy Documentation Precautions:  Precautions Precautions: Fall Precaution Comments: Strong L lateral lean, TLSO for mobility Required Braces or Orthoses: Other Brace(TLSO) Spinal Brace: Thoracolumbosacral orthotic Spinal Brace Comments: for comfort with mobility Restrictions Weight Bearing Restrictions: No   Therapy/Group: Individual Therapy AnnAlfonse Alpers, DPT   11/22/2019, 7:44 AM

## 2019-11-22 NOTE — Progress Notes (Signed)
[facility] Progress Note   Subjective:  No new issues  Objective Vitals:   11/21/19 0553 11/21/19 1420 11/21/19 2001 11/22/19 0521  BP: (!) 144/77 (!) 141/79 116/71 116/75  Pulse: 85 87 96 86  Resp: 18 16 18 16   Temp: 98.6 F (37 C) 98.6 F (37 C) 99.2 F (37.3 C) 98.5 F (36.9 C)  TempSrc: Oral  Oral Oral  SpO2: 100% 100% 100% 100%  Weight:    57.1 kg  Height:        Physical Exam General: Chronically ill appearing female, nad  Heart: RRR  Lungs: clear, bilaterally  Abdomen: soft nontender  Extremities: No LE edema  Dialysis Access: R IJ TDC in place    Weight change: 0.4 kg   Additional Objective Labs: Basic Metabolic Panel: Recent Labs  Lab 11/17/19 0859 11/18/19 1247 11/20/19 1314  NA 136 134* 136  K 3.9 3.9 4.0  CL 96* 97* 99  CO2 23 22 24   GLUCOSE 150* 119* 102*  BUN 30* 43* 28*  CREATININE 5.67* 7.58* 6.32*  CALCIUM 8.5* 8.2* 8.1*  PHOS  --  4.2 3.6   CBC: Recent Labs  Lab 11/17/19 0859 11/18/19 1247 11/19/19 1737 11/20/19 1100  WBC 9.1 8.7 10.9* 11.1*  HGB 8.4* 7.6* 8.1* 8.6*  HCT 27.1* 24.9* 23.1* 28.3*  MCV 91.2 92.2 89.5 92.8  PLT 274 297 543* 247   Blood Culture    Component Value Date/Time   SDES URINE, RANDOM 11/06/2019 1107   SPECREQUEST  11/06/2019 1107    NONE Performed at [facility], [address] [place], [place] [address]    CULT 80,000 COLONIES/mL YEAST (A) 11/06/2019 1107   REPTSTATUS 11/07/2019 FINAL 11/06/2019 1107     Medications: . sodium chloride    . sodium chloride    . cefTAZidime (FORTAZ)  IV 1 g (11/21/19 2023)  . ferric gluconate (FERRLECIT/NULECIT) IV 125 mg (11/20/19 1753)   . acetaminophen  500 mg Oral TID  . apixaban  5 mg Oral BID  . calcium carbonate  1 tablet Oral 2 times per day  . carvedilol  12.5 mg Oral BID  . Chlorhexidine Gluconate Cloth  6 each Topical Q0600  . ciprofloxacin  500 mg Oral Q24H  . darbepoetin (ARANESP) injection - DIALYSIS  100 mcg  Intravenous Q Mon-HD  . doxercalciferol  1.5 mcg Intravenous Q M,W,F-HD  . feeding supplement (ENSURE ENLIVE)  237 mL Oral BID BM  . insulin aspart  0-9 Units Subcutaneous TID WC  . isosorbide mononitrate  30 mg Oral QPC lunch  . lactulose  20 g Oral TID  . lidocaine  1 patch Transdermal Q24H  . Melatonin  3 mg Oral QHS  . multivitamin  1 tablet Oral QPC lunch  . pantoprazole  40 mg Oral Daily  . saccharomyces boulardii  250 mg Oral BID    Dialysis Orders:  [facility] MWF3.5 hours TDC - mult failed accesses Profile 1 - pressure drops very easily;max is pull 1.1 an hour; do not pull rinseback revaclear 300 dialyzer 400/600 2 K 2.5 Ca EDW 62.5  Epo 1200 U q HD; iron 50 mg/weekly 1.5 mcg hectoral each tx  Assessment/Plan: 1. Recurrent Pseudomonas bacteremia with evidence of mitral valve endocarditisthat is post [facility] exchange for infection focus control. Seen by TCTS w/no  plan for surgical intervention. Completed 26 days cefepime. Antibiotics changed to [facility] per ID 12/25.  2. Progression L3-4 discitis on MRI. Evaluated by neurosurgery. No indication  for surgical intervention. ID has directed ABX and duration. Antibiotics changed to Fortaz/Cipro.  3. ESRD: MWF HD. Next HD 12/30.  3K bath here. Tight heparin.  4. Anemia:hgb  8s. ESA qMon. 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  6. 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 Ongoing inpatient rehabilitation for management of neurological deficits. Repeat MRI showedno acute changes 7. Hypertension/volume : BP low/stable.  Net UF 227ml. UF 0.5 next HD.  8.Nutrition -diet liberalized to regular - has supplements/multivits ordered - alb very low - not eating much - contributing to weakness 9. Ascending colon pneumatosis - seen on CT 12/24. No surgical  intervention. 10. Deconditioning -in [place]  11/22/2019,12:51 PM  LOS: 21 days

## 2019-11-22 NOTE — Patient Care Conference (Signed)
Inpatient RehabilitationTeam Conference and Plan of Care Update Date: 11/22/2019   Time: 11:45 AM    Patient Name: [name]      Medical Record Number: [id number]  Date of Birth: 07/21/1951 Sex: Female         Room/Bed: 4W12C/4W12C-01 Payor Info: Payor: HU[employer] MEDICARE / Plan: HU[employer] MEDICARE CHOICE PPO / Product Type: *No Product type* /    Admit Date/Time:  11/01/2019  5:11 PM  Primary Diagnosis:  Cerebrovascular accident (CVA) of right basal ganglia [facility])  Patient Active Problem List   Diagnosis Date Noted  . Subtherapeutic international normalized ratio (INR)   . Pneumatosis coli   . Ogilvie's syndrome   . Decubitus ulcer of sacral region, unstageable ([place])   . Hemodialysis-associated hypotension   . Supratherapeutic INR   . Gastroesophageal reflux disease   . Encephalopathy, hepatic ([place])   . Hyperammonemia ([place])   . Sleep disturbance   . Candidiasis   . Endocarditis of mitral valve   . Septic embolism ([place])   . Seizure ([place])   . Slow transit constipation   . Acute on chronic anemia   . Labile blood pressure   . Labile blood glucose   . Diabetes mellitus type 2 in nonobese (HCC)   . Bacteremia   . Cerebrovascular accident (CVA) of right basal ganglia ([place]) 11/01/2019  . Acute bacterial endocarditis   . Acute left-sided weakness   . Acute ischemic stroke ([place]) 10/20/2019  . Hyperkalemia 09/10/2019  . End-stage renal disease on hemodialysis ([place])   . Nausea vomiting and diarrhea   . Bacteremia due to Pseudomonas 07/20/2019  . ESRD needing dialysis ([place]) 05/15/2019  . Anemia of chronic renal failure 04/03/2018  . Essential hypertension 04/03/2018  . Type 2 diabetes mellitus with diabetic nephropathy ([place]) 04/03/2018    Expected Discharge Date: Expected Discharge Date: 12/07/19  Team Members Present: Physician leading conference: Dr. Delice Lesch Social Worker Present: Ovidio Kin, LCSW Nurse Present: Blair Heys, RN Case Manager: Karene Fry, RN PT  Present: Becky Sax, PT OT Present: [name], OT SLP Present: Jettie Booze, CF-SLP PPS Coordinator present : Gunnar Fusi, SLP     Current Status/Progress Goal Weekly Team Focus  Bowel/Bladder   oliguric. LBM 12/30. continent of bowel  maintain continence, continue HD X3 weekly  assess toileting needs q shift/prn   Swallow/Nutrition/ Hydration             ADL's   Mod-max assist squat pivot transfers, Mod assist sit > stand, improved awareness on 12/29.  Limited progress this past week due to bedrest orders until 12/29 with limited participation, confusion, and pain  Downgraded to mod assist transfers, standing balance, toileting and LB dressing; Min assist bathing, UB dressing, grooming  ADL retraining, activity tolerance, pain management, BUE NMR, pt/family education   Mobility   rolling in bed supervision with bedrails, min A supine<>sit and sit<>supine  Mod A transfers, Max A stairs  Bedrest orders, otherwise LE strength, dynamic postual control, UE strength   Communication             Safety/Cognition/ Behavioral Observations  improving mentation, less language of confusion overall, Min A  Supervision A  basic to semi-complex problem solving, functional short term recall and use of memory strategies, sustained and selective attention   Pain   lower back pain on scheduled tylenol and prn oxycodone  <3  assess pain q shift/prn   Skin   unstageable pressure injury to sacrum. foam in place  Improvement of  sacral wound no further skin breakdown.  assess skin q shift/prn      *See Care Plan and progress notes for long and short-term goals.     Barriers to Discharge  Current Status/Progress Possible Resolutions Date Resolved   Nursing                  PT                    OT                  SLP                SW                Discharge Planning/Teaching Needs:  Medical issues still persist-has been on bed rest this week and not able to fully participate in  therapies due to her medical issues. Son here daily and involved in her care      Team Discussion: Currently followed by GI, surgery, ID, hospitalist and nephrology, coumadin changed to eloquis today, new abx until 1/13, HD M-W-F.  RN hip pain, had tylenol, refused lactalose, loose BMs, on reg/thins, sacral wound, HD today.  OT mod A transfers, pain shower today, L hip pain, impulsive, tot B/D, downgrade to mod A goals.  PT min bed, mod A stand pivot, amb 17' mod A, mod A goals, max A stair goals.  SLP cog better, goals S for cognition, less confused.   Revisions to Treatment Plan: N/A     Medical Summary Current Status: Impaired gait with left side weakness secondary to right basal ganglia infarction as well as small cortical infarct left frontal lobe Weekly Focus/Goal: Improve mobility, DM, bacteremia, pneumatosis coli  Barriers to Discharge: Hemodialysis;Medical stability;IV antibiotics   Possible Resolutions to Barriers: Therapies, follow Hospitalist, GI, Surg, ID, Nephro recs, optimize DM meds, IV abx   Continued Need for Acute Rehabilitation Level of Care: The patient requires daily medical management by a physician with specialized training in physical medicine and rehabilitation for the following reasons: Direction of a multidisciplinary physical rehabilitation program to maximize functional independence : Yes Medical management of patient stability for increased activity during participation in an intensive rehabilitation regime.: Yes Analysis of laboratory values and/or radiology reports with any subsequent need for medication adjustment and/or medical intervention. : Yes   I attest that I was present, lead the team conference, and concur with the assessment and plan of the team.   [name] 11/22/2019, 9:43 PM  Team conference was held via web/ teleconference due to [place] - 19

## 2019-11-22 NOTE — Progress Notes (Signed)
PROGRESS NOTE    [name]  [id number] DOB: 1951/01/16 DOA: 11/01/2019 PCP: Manon Hilding, MD   Brief Narrative: [name] is a 68 y.o. female with a history of ESRD on HD, mitral valve endocarditis, hepatic encephalopathy, chronic anemia, diabetes mellitus, CVA. Patient is currently admitted to inpatient rehab for post-CVA rehabilitation and was found to have worsening mentation.  Assessment & Plan:   Principal Problem:   Cerebrovascular accident (CVA) of right basal ganglia (HCC) Active Problems:   Slow transit constipation   Acute on chronic anemia   Labile blood pressure   Labile blood glucose   Diabetes mellitus type 2 in nonobese (HCC)   Bacteremia   Endocarditis of mitral valve   Septic embolism (HCC)   Seizure (HCC)   Candidiasis   Encephalopathy, hepatic (HCC)   Hyperammonemia (HCC)   Sleep disturbance   Hemodialysis-associated hypotension   Supratherapeutic INR   Gastroesophageal reflux disease   Decubitus ulcer of sacral region, unstageable ([place])   Ogilvie's syndrome   Pneumatosis coli   Subtherapeutic international normalized ratio (INR)   Encephalopathy, resolved Unknown etiology. Thought possibly secondary to delirium. Appears to be improved today but just somnolent possibly secondary to Seroquel overnight. Resolved.  Intractable abdominal pain, resolving  Questionable Ogilvie's syndrome Initially with symptoms on 12/23. Abdominal X-ray concerning for volvulus and CT scan significant for evidence of pneumatosis coli. General surgery consulted and assessment includes likely Olgivie's syndrome. Abdominal pain is improved from early but she still is tender in the RUQ. -General surgery recommendations: Medical management no indication for surgery -GI recommendations: observation, medical management  Diabetes mellitus, type 2 Patient is on insulin NPH-regular (70-30) 4 units BID WC as an outpatient. Currently on SSI. Blood sugar controlled.  -Continue SSI  Hyperammonemia History of hepatic encephalopathy -Continue lactulose  Discitis L3-L4. In setting of bacterial endocarditis. Patient does have a history of sacral decubitus ulcer. Afebrile and normal WBC. Pelvic x-ray not suggestive of fracture. MRI obtained which shows concern for infectious process. Neurosurgery consulted and their concern is discitis; recommended ID re-consult. Patient has been afebrile and WBC is normal today. Patient currently on Ceftazidime and Ciprofloxacin -Per ID  Coagulopathy Secondary to Coumadin. INR appears to have reached plateau . No obvious source of bleeding. Hemoglobin stable. Given 1 mg of vitamin K with reversal of INR down to 2.1. Unsure of etiology but this could possibly be secondary to vitamin K deficiency.  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 Evaluated by neurology with LT EEG. Gabapentin discontinued. No reported recurrences.   Right IJ DVT Acute from hospitalization in November. Started on Coumadin. -Coumadin -Start heparin for bridge in setting of subtherapeutic INR and recent vitamin K administration  Mitral valve endocarditis Seen by cardiothoracic surgery on medical admission. No plan for replacement valve at this acute period but will need follow-up as an outpatient. Was on Cefepime/cipro, transitioned to Cefepime monotherapy and now is on Ceftazidime and Cipro. TCTS reevaluated MV with limited Transthoracic Echocardiogram on 12/28 -Per ID -TCTS: recommendations pending  Essential hypertension -Continue Imdur  CVA Recently hospitalized and is now undergoing inpatient rehabilitation. Per stroke team, does not appear aspirin was considered for continuation on discharge. -Continue Coumadin  Pressure injury Medial sacrum, not POA    DVT prophylaxis: Coumadin (currently held secondary to supertherapeutic INR) Code Status:   Code Status: DNR Family Communication: None at bedside   Procedures:   HD   12/28: Limited Transthoracic Echocardiogram IMPRESSIONS    1. Left ventricular ejection  fraction, by visual estimation, is 55 to 60%. The left ventricle has normal function. Left ventricular septal wall thickness was severely increased. Moderately increased left ventricular posterior wall thickness.  2. Elevated left atrial and left ventricular end-diastolic pressures.  3. Left ventricular diastolic parameters are consistent with Grade I diastolic dysfunction (impaired relaxation).  4. Moderate thickening of the anterior mitral valve leaflet(s).  5. Moderate vegetation on the mitral valve.  6. The mitral valve is abnormal. Moderate to severe mitral valve regurgitation.  7. Tricuspid valve regurgitation moderate.  8. Tricuspid valve regurgitation moderate.  9. Moderate thickening. 10. Normal pulmonary artery systolic pressure. 11. A prior study was performed on 10/26/2019. 12. Changes from prior study are noted. 13. Compared to the prior TEE, moderate AMVL endocarditis persists with a mobile mass noted on the anterior leaflet ~0.5 cm in diameter, may be vegetation or ruptured apparatus. There is moderate to severe, posteriorly directed MR. Clinical correlation  with CHF symptoms is recommended.  Antimicrobials:  Cefepime  Ceftazidime   Subjective: No acute issues or events overnight, patient requesting to get out of bed, "tired of being stuck in bed" which reexplain was why she was at physical therapy.  Otherwise declines any abdominal pain, nausea, vomiting, diarrhea, constipation, headache, fevers, chills.  Objective: Vitals:   11/21/19 0553 11/21/19 1420 11/21/19 2001 11/22/19 0521  BP: (!) 144/77 (!) 141/79 116/71 116/75  Pulse: 85 87 96 86  Resp: 18 16 18 16   Temp: 98.6 F (37 C) 98.6 F (37 C) 99.2 F (37.3 C) 98.5 F (36.9 C)  TempSrc: Oral  Oral Oral  SpO2: 100% 100% 100% 100%  Weight:    57.1 kg  Height:        Intake/Output Summary (Last 24 hours) at 11/22/2019  1136 Last data filed at 11/21/2019 2023 Gross per 24 hour  Intake 100 ml  Output -  Net 100 ml   Filed Weights   11/20/19 1256 11/20/19 1636 11/22/19 0521  Weight: 56.7 kg 56.2 kg 57.1 kg    Examination:  General exam: Appears calm and comfortable Respiratory system: Clear to auscultation. Respiratory effort normal. Cardiovascular system: S1 & S2 heard, RRR. 2/6 systolic murmur. Gastrointestinal system: Abdomen is slightly distended, soft and mildly tender in RUQ. Normal bowel sounds heard. Central nervous system: Alert and oriented. No focal neurological deficits. Extremities: No edema. No calf tenderness Skin: No cyanosis. No rashes Psychiatry: Judgement and insight appear normal. Mood & affect appropriate.     Data Reviewed: I have personally reviewed following labs and imaging studies  CBC: Recent Labs  Lab 11/17/19 0859 11/18/19 1247 11/19/19 1737 11/20/19 1100  WBC 9.1 8.7 10.9* 11.1*  HGB 8.4* 7.6* 8.1* 8.6*  HCT 27.1* 24.9* 23.1* 28.3*  MCV 91.2 92.2 89.5 92.8  PLT 274 297 543* [id number]   Basic Metabolic Panel: Recent Labs  Lab 11/17/19 0859 11/18/19 1247 11/20/19 1314  NA 136 134* 136  K 3.9 3.9 4.0  CL 96* 97* 99  CO2 23 22 24   GLUCOSE 150* 119* 102*  BUN 30* 43* 28*  CREATININE 5.67* 7.58* 6.32*  CALCIUM 8.5* 8.2* 8.1*  PHOS  --  4.2 3.6   GFR: Estimated Creatinine Clearance: 7.6 mL/min (A) (by C-G formula based on SCr of 6.32 mg/dL (H)). Liver Function Tests: Recent Labs  Lab 11/18/19 1247 11/20/19 1314  ALBUMIN 1.7* 1.8*   No results for input(s): LIPASE, AMYLASE in the last 168 hours. No results for input(s): AMMONIA in the last 168 hours.  Coagulation Profile: Recent Labs  Lab 11/18/19 0717 11/19/19 0637 11/20/19 1100 11/21/19 0602 11/22/19 0532  INR 2.1* 1.5* 2.0* 1.8* 1.7*   Cardiac Enzymes: No results for input(s): CKTOTAL, CKMB, CKMBINDEX, TROPONINI in the last 168 hours. BNP (last 3 results) No results for input(s): PROBNP  in the last 8760 hours. HbA1C: No results for input(s): HGBA1C in the last 72 hours. CBG: Recent Labs  Lab 11/21/19 0621 11/21/19 1223 11/21/19 1657 11/21/19 2139 11/22/19 0626  GLUCAP 95 101* 159* 96 108*   Lipid Profile: No results for input(s): CHOL, HDL, LDLCALC, TRIG, CHOLHDL, LDLDIRECT in the last 72 hours. Thyroid Function Tests: No results for input(s): TSH, T4TOTAL, FREET4, T3FREE, THYROIDAB in the last 72 hours. Anemia Panel: No results for input(s): VITAMINB12, FOLATE, FERRITIN, TIBC, IRON, RETICCTPCT in the last 72 hours. Sepsis Labs: No results for input(s): PROCALCITON, LATICACIDVEN in the last 168 hours.  No results found for this or any previous visit (from the past 240 hour(s)).   Radiology Studies: No results found.   Scheduled Meds: . acetaminophen  500 mg Oral TID  . apixaban  5 mg Oral BID  . calcium carbonate  1 tablet Oral 2 times per day  . carvedilol  12.5 mg Oral BID  . Chlorhexidine Gluconate Cloth  6 each Topical Q0600  . ciprofloxacin  500 mg Oral Q24H  . darbepoetin (ARANESP) injection - DIALYSIS  100 mcg Intravenous Q Mon-HD  . doxercalciferol  1.5 mcg Intravenous Q M,W,F-HD  . feeding supplement (ENSURE ENLIVE)  237 mL Oral BID BM  . insulin aspart  0-9 Units Subcutaneous TID WC  . isosorbide mononitrate  30 mg Oral QPC lunch  . lactulose  20 g Oral TID  . lidocaine  1 patch Transdermal Q24H  . Melatonin  3 mg Oral QHS  . multivitamin  1 tablet Oral QPC lunch  . pantoprazole  40 mg Oral Daily  . saccharomyces boulardii  250 mg Oral BID   Continuous Infusions: . sodium chloride    . sodium chloride    . cefTAZidime (FORTAZ)  IV 1 g (11/21/19 2023)  . ferric gluconate (FERRLECIT/NULECIT) IV 125 mg (11/20/19 1753)     LOS: 21 days   [facility] Hospital[facility]  11/22/2019, 11:36 AM  If 7PM-7AM, please contact night-coverage [email or web address]

## 2019-11-22 NOTE — Progress Notes (Signed)
Right upper ext venous duplex  has been completed. Refer to [facility] under chart review to view preliminary results.   11/22/2019  3:04 PM [name], Bonnye Fava

## 2019-11-22 NOTE — Progress Notes (Signed)
Occupational Therapy Session Note  Patient Details  Name: [name] MRN: [id number] Date of Birth: 04-07-1951  Today's Date: 11/22/2019 OT Individual Time: [id number] OT Individual Time Calculation (min): 48 min  and Today's Date: 11/22/2019 OT Missed Time: 12 Minutes Missed Time Reason: Pain   Short Term Goals: Week 3:  OT Short Term Goal 1 (Week 3): Pt will perform shower transfer with mod A. OT Short Term Goal 2 (Week 3): Pt will perform toileting with mod A. OT Short Term Goal 3 (Week 3): Pt will complete LB dressing with mod assist OT Short Term Goal 4 (Week 3): Pt will tolerate sitting OOB 2 hrs to demonstrate increased activity tolerance  Skilled Therapeutic Interventions/Progress Updates:    Treatment session with focus on functional transfers and sit <> stand during self-care tasks.  Pt received supine in bed expressing desire to shower.  MD present and okayed shower and to remain off bedrest.  Pt completed rolling at bed level with supervision.  Reports pain in Lt hip when attempting sidelying to sit to Lt side of bed, therefore rolled to Rt and completed sidelying to sit at Rt side of bed with min assist and support to continue with forward motion as pain started to set in during mobility.  Once EOB, pain subsided enough to transfer to w/c mod assist.  While seated in w/c pt repositioning frequently due to pain in Lt hip.  Therapist covered HD cath and IV in arm to ensure maintained dry for shower.  Completed stand pivot transfer to shower chair in room shower with mod assist, mostly due to pain.  Total assist for bathing this session as pt fixated on pain in Lt hip and unable to participate in shower but wanting to be clean.  Pt able to complete sit > stand with min assist with heavy reliance on grab bars, to allow therapist to wash buttocks.  Returned to bed mod assist and completed drying off at bed level total assist. Pt donned overhead gown with mod assist and rolling Rt and  Lt to allow therapist to pull gown over hips.  Pt left in sidelying on kpad and with all needs in reach.  RN notified of pain.  Pt missed remaining 12 mins due to severe pain in Lt hip and inability to be redirected.  Therapy Documentation Precautions:  Precautions Precautions: Fall Precaution Comments: Strong L lateral lean, TLSO for mobility Required Braces or Orthoses: Other Brace(TLSO) Spinal Brace: Thoracolumbosacral orthotic Spinal Brace Comments: for comfort with mobility Restrictions Weight Bearing Restrictions: No General: General OT Amount of Missed Time: 12 Minutes Vital Signs: Therapy Vitals Temp: 98.5 F (36.9 C) Temp Source: Oral Pulse Rate: 86 Resp: 16 BP: 116/75 Patient Position (if appropriate): Lying Oxygen Therapy SpO2: 100 % O2 Device: Room Air Pain: Pain Assessment Pain Scale: Faces Faces Pain Scale: Hurts whole lot Pain Type: Acute pain Pain Location: Hip Pain Orientation: Left Pain Descriptors / Indicators: Aching;Sharp;Stabbing Pain Intervention(s): RN made aware;Heat applied;Shower   Therapy/Group: Individual Therapy  [name] 11/22/2019, 8:21 AM

## 2019-11-22 NOTE — Plan of Care (Signed)
Problem: Consults Goal: RH STROKE PATIENT EDUCATION Description: See Patient Education module for education specifics  11/22/2019 1749 by Glean Salen, RN Outcome: Progressing 11/22/2019 1747 by Glean Salen, RN Outcome: Progressing   Problem: RH BOWEL ELIMINATION Goal: RH STG MANAGE BOWEL WITH ASSISTANCE Description: STG Manage Bowel with mod I/min Assistance. 11/22/2019 1749 by Glean Salen, RN Outcome: Progressing Flowsheets (Taken 11/22/2019 1749) STG: Pt will manage bowels with assistance: 2-Maximum assistance 11/22/2019 1747 by Glean Salen, RN Outcome: Progressing Flowsheets (Taken 11/22/2019 1747) STG: Pt will manage bowels with assistance: 2-Maximum assistance Goal: RH STG MANAGE BOWEL W/MEDICATION W/ASSISTANCE Description: STG Manage Bowel with Medication with mod I Assistance. 11/22/2019 1749 by Glean Salen, RN Outcome: Progressing Flowsheets (Taken 11/22/2019 1749) STG: Pt will manage bowels with medication with assistance: 3-Moderate assistance 11/22/2019 1747 by Glean Salen, RN Outcome: Progressing   Problem: RH SKIN INTEGRITY Goal: RH STG SKIN FREE OF INFECTION/BREAKDOWN Description: Patients skin will remain free from further infection or breakdown with min assist. 11/22/2019 1749 by Glean Salen, RN Outcome: Progressing 11/22/2019 1747 by Glean Salen, RN Outcome: Progressing Goal: RH STG MAINTAIN SKIN INTEGRITY WITH ASSISTANCE Description: STG Maintain Skin Integrity With min Assistance. 11/22/2019 1749 by Glean Salen, RN Outcome: Progressing Flowsheets (Taken 11/22/2019 1749) STG: Maintain skin integrity with assistance: 2-Maximum assistance 11/22/2019 1747 by Glean Salen, RN Outcome: Progressing Flowsheets (Taken 11/22/2019 1747) STG: Maintain skin integrity with assistance: 2-Maximum assistance   Problem: RH SAFETY Goal: RH STG ADHERE TO SAFETY PRECAUTIONS W/ASSISTANCE/DEVICE Description: STG Adhere to Safety Precautions  With supervision/min Assistance/Device. 11/22/2019 1749 by Glean Salen, RN Outcome: Progressing Flowsheets (Taken 11/22/2019 1749) STG:Pt will adhere to safety precautions with assistance/device: 2-Maximum assistance 11/22/2019 1747 by Glean Salen, RN Outcome: Progressing Flowsheets (Taken 11/22/2019 1747) STG:Pt will adhere to safety precautions with assistance/device: 2-Maximum assistance Goal: RH STG DECREASED RISK OF FALL WITH ASSISTANCE Description: STG Decreased Risk of Fall With supervision/min Assistance. 11/22/2019 1749 by Glean Salen, RN Outcome: Progressing Flowsheets (Taken 11/22/2019 1749) [id number] risk of fall  with assistance/device: 2-Maximum assistance 11/22/2019 1747 by Glean Salen, RN Outcome: Progressing Flowsheets (Taken 11/22/2019 1747) [id number] risk of fall  with assistance/device: 2-Maximum assistance   Problem: RH PAIN MANAGEMENT Goal: RH STG PAIN MANAGED AT OR BELOW PT'S PAIN GOAL Description: < 5 11/22/2019 1749 by Glean Salen, RN Outcome: Progressing 11/22/2019 1747 by Glean Salen, RN Outcome: Progressing   Problem: RH KNOWLEDGE DEFICIT Goal: RH STG INCREASE KNOWLEDGE OF DIABETES Description: Patient/caregiver will verbalize understanding of DM including diet, exercise, medications, monitoring, and follow up care with min assist. 11/22/2019 1749 by Glean Salen, RN Outcome: Progressing 11/22/2019 1747 by Glean Salen, RN Outcome: Progressing Goal: RH STG INCREASE KNOWLEDGE OF HYPERTENSION Description: Patient/caregiver will verbalize understanding of HTN including diet, exercise, medications, monitoring, and follow up care with min assist. 11/22/2019 1749 by Glean Salen, RN Outcome: Progressing 11/22/2019 1747 by Glean Salen, RN Outcome: Progressing Goal: RH STG INCREASE KNOWLEGDE OF HYPERLIPIDEMIA Description: Patient/caregiver will verbalize understanding of HLD including diet, exercise, medications,  monitoring, and follow up care with min assist. 11/22/2019 1749 by Glean Salen, RN Outcome: Progressing 11/22/2019 1747 by Glean Salen, RN Outcome: Progressing Goal: RH STG INCREASE KNOWLEDGE OF STROKE PROPHYLAXIS Description: Patient/caregiver will verbalize understanding of stroke prophylaxis including diet, exercise, medications, monitoring, and follow up care with min assist. 11/22/2019 1749 by Glean Salen, RN Outcome: Progressing 11/22/2019 1747 by  Glean Salen, RN Outcome: Progressing

## 2019-11-23 ENCOUNTER — Inpatient Hospital Stay (HOSPITAL_COMMUNITY): Payer: Medicare PPO

## 2019-11-23 ENCOUNTER — Inpatient Hospital Stay (HOSPITAL_COMMUNITY): Payer: Medicare PPO | Admitting: Speech Pathology

## 2019-11-23 ENCOUNTER — Inpatient Hospital Stay (HOSPITAL_COMMUNITY): Payer: Medicare PPO | Admitting: Occupational Therapy

## 2019-11-23 DIAGNOSIS — E1122 Type 2 diabetes mellitus with diabetic chronic kidney disease: Secondary | ICD-10-CM | POA: Diagnosis not present

## 2019-11-23 DIAGNOSIS — I10 Essential (primary) hypertension: Secondary | ICD-10-CM | POA: Diagnosis not present

## 2019-11-23 LAB — GLUCOSE, CAPILLARY
Glucose-Capillary: 116 mg/dL — ABNORMAL HIGH (ref 70–99)
Glucose-Capillary: 189 mg/dL — ABNORMAL HIGH (ref 70–99)
Glucose-Capillary: 124 mg/dL — ABNORMAL HIGH (ref 70–99)
Glucose-Capillary: 79 mg/dL (ref 70–99)

## 2019-11-23 LAB — PROTIME-INR
INR: 3.3 — ABNORMAL HIGH (ref 0.8–1.2)
Prothrombin Time: 33.5 seconds — ABNORMAL HIGH (ref 11.4–15.2)

## 2019-11-23 MED ORDER — MUSCLE RUB 10-15 % EX CREA
TOPICAL_CREAM | Freq: Two times a day (BID) | CUTANEOUS | Status: DC
  Administered 2019-11-28 – 2019-11-29 (×2): 1 via TOPICAL
  Filled 2019-11-23: qty 85

## 2019-11-23 MED ORDER — TROLAMINE SALICYLATE 10 % EX CREA
TOPICAL_CREAM | Freq: Two times a day (BID) | CUTANEOUS | Status: DC
  Filled 2019-11-23: qty 85

## 2019-11-23 MED ORDER — COLLAGENASE 250 UNIT/GM EX OINT
TOPICAL_OINTMENT | Freq: Every day | CUTANEOUS | Status: DC
  Filled 2019-11-23: qty 30

## 2019-11-23 NOTE — Progress Notes (Signed)
[facility] PHYSICAL MEDICINE & REHABILITATION PROGRESS NOTE   Subjective/Complaints: Patient seen laying in bed this morning.  She states she slept well overnight.  She is happy to be more active.  She states "we are going to turn a new year tonight".  She was seen by surgery, hospitalist, nephrology yesterday.  Notes reviewed.  ROS: Denies CP, SOB, N/V/D  Objective:   VAS [name] UPPER EXTREMITY VENOUS DUPLEX  Result Date: 11/22/2019 UPPER VENOUS STUDY  Indications: History of right IJV DVT and paritial thrombus in the brachial vein on 10/21/19. Other Indications: Right sided dialysis cathether in place. Performing Technologist: Oda Cogan RDMS, RVT  Examination Guidelines: A complete evaluation includes B-mode imaging, spectral Doppler, color Doppler, and power Doppler as needed of all accessible portions of each vessel. Bilateral testing is considered an integral part of a complete examination. Limited examinations for reoccurring indications may be performed as noted.  Right Findings: +----------+------------+---------+-----------+----------+--------------+ RIGHT     CompressiblePhasicitySpontaneousProperties   Summary     +----------+------------+---------+-----------+----------+--------------+ IJV         Partial      No        No                  Chronic     +----------+------------+---------+-----------+----------+--------------+ Subclavian    Full       Yes       Yes                             +----------+------------+---------+-----------+----------+--------------+ Axillary      Full       Yes       Yes                             +----------+------------+---------+-----------+----------+--------------+ Brachial      Full       Yes       Yes                             +----------+------------+---------+-----------+----------+--------------+ Radial        Full                                                  +----------+------------+---------+-----------+----------+--------------+ Ulnar         Full                                                 +----------+------------+---------+-----------+----------+--------------+ Cephalic                                            Not visualized +----------+------------+---------+-----------+----------+--------------+ Basilic                                             Not visualized +----------+------------+---------+-----------+----------+--------------+ Chronic 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  Left Findings: +----------+------------+---------+-----------+----------+-------+ LEFT      CompressiblePhasicitySpontaneousPropertiesSummary +----------+------------+---------+-----------+----------+-------+  Subclavian    Full       Yes       Yes                      +----------+------------+---------+-----------+----------+-------+  Summary:  Right: Findings consistent with chronic deep vein thrombosis involving the right internal jugular vein.  Left: No evidence of thrombosis in the subclavian.  *See table(s) above for measurements and observations.  Diagnosing physician: Monica Martinez MD Electronically signed by Monica Martinez MD on 11/22/2019 at 5:43:38 PM.    Final    Recent Labs    11/20/19 1100 11/22/19 1407  WBC 11.1* 13.3*  HGB 8.6* 8.2*  HCT 28.3* 26.6*  PLT 247 231   Recent Labs    11/20/19 1314  NA 136  K 4.0  CL 99  CO2 24  GLUCOSE 102*  BUN 28*  CREATININE 6.32*  CALCIUM 8.1*    Intake/Output Summary (Last 24 hours) at 11/23/2019 0856 Last data filed at 11/23/2019 0400 Gross per 24 hour  Intake 300 ml  Output 1000 ml  Net -700 ml     Physical Exam: Vital Signs Blood pressure 113/66, pulse 85, temperature 98.6 F (37 C), temperature source Oral, resp. rate 18, height 5\' 7"  (1.702 m), weight 55.4 kg, SpO2 100 %.  Constitutional: No distress . Vital signs reviewed. HENT: Normocephalic.   Atraumatic. Eyes: EOMI. No discharge. Cardiovascular: No JVD. Respiratory: Normal effort.  No stridor. GI: Non-distended. Skin: Sacral ulcer not examined today. Psych: Flat. Musc: No edema in extremities.  No tenderness in extremities. Neuro: Alert and oriented x3 Motor:  RUE: 5/5 proximal to distal RLE: HF, KE 4+/5, ADF 4+/5 LUE: 4+/5 proximal to distal LLE: HF, KE 4+/5, ADF 4+/5  Assessment/Plan: 1. Functional deficits secondary to right basal ganglia stroke which require 3+ hours per day of interdisciplinary therapy in a comprehensive inpatient rehab setting.  Physiatrist is providing close team supervision and 24 hour management of active medical problems listed below.  Physiatrist and rehab team continue to assess barriers to discharge/monitor patient progress toward functional and medical goals  Care Tool:  Bathing    Body parts bathed by patient: Right arm, Left arm, Chest, Abdomen, Front perineal area, Right upper leg, Left upper leg, Face   Body parts bathed by helper: Right arm, Left arm, Chest, Abdomen, Front perineal area, Buttocks, Right upper leg, Left upper leg, Right lower leg, Left lower leg     Bathing assist Assist Level: Total Assistance - Patient < 25%     Upper Body Dressing/Undressing Upper body dressing   What is the patient wearing?: Pull over shirt    Upper body assist Assist Level: Moderate Assistance - Patient 50 - 74%    Lower Body Dressing/Undressing Lower body dressing      What is the patient wearing?: Incontinence brief     Lower body assist Assist for lower body dressing: Total Assistance - Patient < 25%     Toileting Toileting    Toileting assist Assist for toileting: Maximal Assistance - Patient 25 - 49%     Transfers Chair/bed transfer  Transfers assist     Chair/bed transfer assist level: Moderate Assistance - Patient 50 - 74%     Locomotion Ambulation   Ambulation assist   Ambulation activity did not occur:  Safety/medical concerns(back pain, bilateral LE weakness, poor bilateral knee control)  Assist level: 2 helpers Assistive device: Walker-rolling Max distance: 98ft   Walk 10 feet activity  Assist  Walk 10 feet activity did not occur: Safety/medical concerns(back pain, bilateral LE weakness, poor bilateral knee control)  Assist level: 2 helpers Assistive device: Walker-rolling   Walk 50 feet activity   Assist Walk 50 feet with 2 turns activity did not occur: Safety/medical concerns(back pain, bilateral LE weakness, poor bilateral knee control)  Assist level: 2 helpers Assistive device: Walker-rolling    Walk 150 feet activity   Assist Walk 150 feet activity did not occur: Safety/medical concerns(back pain, bilateral LE weakness, poor bilateral knee control)         Walk 10 feet on uneven surface  activity   Assist Walk 10 feet on uneven surfaces activity did not occur: Safety/medical concerns(back pain, bilateral LE weakness, poor bilateral knee control)         Wheelchair     Assist Will patient use wheelchair at discharge?: Yes Type of Wheelchair: Manual Wheelchair activity did not occur: Safety/medical concerns(Pt could not tolerate sitting in WC for long enough to complete activity due to low back pain)  Wheelchair assist level: Minimal Assistance - Patient > 75% Max wheelchair distance: 74ft    Wheelchair 50 feet with 2 turns activity    Assist    Wheelchair 50 feet with 2 turns activity did not occur: Safety/medical concerns(Pt could not tolerate sitting in WC for long enough to complete activity due to low back pain)       Wheelchair 150 feet activity     Assist  Wheelchair 150 feet activity did not occur: Safety/medical concerns(Pt could not tolerate sitting in WC for long enough to complete activity due to low back pain)       Blood pressure 113/66, pulse 85, temperature 98.6 F (37 C), temperature source Oral, resp. rate 18,  height 5\' 7"  (1.702 m), weight 55.4 kg, SpO2 100 %.    Medical Problem List and Plan: 1.  Impaired gait with left side weakness secondary to right basal ganglia infarction as well as small cortical infarct left frontal lobe  CT head on 12/13 personally reviewed, showing some improvement, MRI personally reviewed, stable. Repeat MRI ordered again by ID, showing stable infarcts  Chest x-ray personally reviewed, unremarkable for infection  Discussed with neurology results of the EEG -findings possibly related to uremia +/- cefepime  Discussed with psychiatry, continue medical management  Discussed with hospitalist, appreciate recs, discussed with hospitalist again on 12/25  Continue CIR 2.  Antithrombotics: -DVT/anticoagulation: Chronic Coumadin for right IJ acute DVT.  Plan anticoagulation x3 months  INR supratherapeutic at 3.3 on 12/31.  Transitioned to Eliquis as it has been very difficult to manage INR - discussed pharmacy, no need to hold Eliquis and no need to continue to follow INR   Repeat U/S suggesting chronic DVT             -antiplatelet therapy: N/A 3. Pain Management chronic back and left hip pain: Neurontin 100 mg on hold  TLSO back brace for comfort  Lidocaine patch added to coccyx  Relatively controlled on 12/31 4. Mood: Provide emotional support             -antipsychotic agents: N/A 5. Neuropsych: This patient is not capable of making decisions on her own behalf. 6. Skin/Wound Care: Routine skin checks  Santyl to sacral wound 7. Fluids/Electrolytes/Nutrition: Routine in and outs 8.  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  New HD tunneled catheter placed 10/26/2019.  Repeat echo showing increase in mitral regurg  CT surgery recommending mitral valve replacement- will follow as outpatient. 9.  ID/discitis/recurrent Pseudomonas bacteremia.    Cipro/Maxipime changed to ceftazidime/Cipro through  12/06/2019.  Seen by neurosurgery on 12/25, no surgical intervention recommended at this point, however recommended following ID recs  Appreciate ID recs  10.  Hemodialysis MWF.  Follow-up per renal services.  11.  Diabetes mellitus.  Hemoglobin A1c 7.6.  SSI. CBG (last 3)  Recent Labs    11/22/19 1800 11/22/19 2116 11/23/19 0609  GLUCAP 87 117* 116*   Slightly labile on 12/31 12.  Hypertension.  Imdur 30 mg daily  Coreg 6.25 mg twice daily, increased to 12.5 on 12/15, parameters placed.    Monitor with increased mobility.  Controlled on 12/31 13.  Acute on chronic anemia.    Hemoglobin 8.2 on 12/30  Continue to monitor 14. Constipation:  See #16  Abd xray showing?  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  Candidiasis  Diflucan ordered on 12/16  Repeat Ucx not collected yet on 12/30 16.  Hyperammonemia with hepatic encephalopathy  Lactulose started on 12/17, encourage compliance  Ammonia WNL on 12/22 17. Sleep disturance  Melatonin added on 12/17  Sleep chart ordered  Improving 18.  Leukocytosis  WBCs 13.3 on 12/30, labs with HD  Afebrile  Continue to monitor 19.  GERD  Home Protonix ordered on 12/20 20.  Pneumatosis Coli  CT abdomen/pelvis on 12/24?  Obese.  Appreciate surgery recs-recommending GI recs, no surgical intervention, signed off  Appreciate GI recs, advising against colonoscopy and recommending conservative care at present with repeat imaging in 1-3 months.  LOS: 22 days A FACE TO FACE EVALUATION WAS PERFORMED  [name] Lorie Phenix 11/23/2019, 8:56 AM

## 2019-11-23 NOTE — Progress Notes (Signed)
Patient c/o pain on her mid  bottom unstageable wound. Dan PA notified.

## 2019-11-23 NOTE — Progress Notes (Signed)
Patient continuous to refuse laclulose; Dan PA notified.

## 2019-11-23 NOTE — Progress Notes (Signed)
Speech Language Pathology Weekly Progress and Session Note  Patient Details  Name: [name] MRN: [id number] Date of Birth: 1951/09/20  Beginning of progress report period: November 16, 2019 End of progress report period: November 23, 2019  Today's Date: 11/23/2019 SLP Individual Time: 5188-4166 SLP Individual Time Calculation (min): 55 min  Short Term Goals: Week 3: SLP Short Term Goal 1 (Week 3): Pt will demonstrate functional problem solving during basic tasks with Mod A verbal/visual cues SLP Short Term Goal 1 - Progress (Week 3): Met SLP Short Term Goal 2 (Week 3): Pt will demonstrate recall of day to day/new information with Mod A verbal cues for use of compensatory memory strategies SLP Short Term Goal 2 - Progress (Week 3): Met SLP Short Term Goal 3 (Week 3): Pt will sustain attention to functional tasks with Min A verbal cues for redirection SLP Short Term Goal 3 - Progress (Week 3): Met SLP Short Term Goal 4 (Week 3): Pt will detect and correct errors during functional tasks with Mod A verbal/visual cues. SLP Short Term Goal 4 - Progress (Week 3): Met SLP Short Term Goal 5 (Week 3): Pt will initiate during functional tasks with Mod A verbal/visual cues from clinician. SLP Short Term Goal 5 - Progress (Week 3): Met    New Short Term Goals: Week 4: SLP Short Term Goal 1 (Week 4): Pt will demonstrate functional problem solving during basic tasks with Min A verbal/visual cues SLP Short Term Goal 2 (Week 4): Pt will demonstrate recall of day to day/new information with Min A verbal cues for use of compensatory memory strategies SLP Short Term Goal 3 (Week 4): Pt will detect and correct errors during functional tasks with Min A verbal/visual cues. SLP Short Term Goal 4 (Week 4): Pt will selectively attend to functional tasks with Min A verbal cues for redirection SLP Short Term Goal 5 (Week 4): Pt will demonstrate intellectual awareness by identifying 1 physical and 1  cognitive deficit with Min A verbal cues.  Weekly Progress Updates: Pt has made excellent functional gains and met 5 out of 5 short term goals this reporting period. Pt is currently Min-Mod assist for cognition due to impairments impacting problem solving, selective attention, short term memory, and awareness. Pt has demonstrated markedly improved mentation this week, she is less confused and SLP has not noted any language of confusion over the last 2-3 sessions. She is able to maintain open eyes and alterness and more willing/able to participate in structured and functional cognitive tasks this week in comparison to last. Pt education is ongoing; pt's son was not present for ST over the last week, however there will be future opportunities for family education closer to d/c date (anticipated 12/06/18). Pt would continue to benefit from skilled ST while inpatient in order to maximize functional independence and reduce burden of care prior to discharge. Anticipate that pt will need 24/7 supervision at discharge in addition to [place] follow up at next level of care.      Intensity: Minumum of 1-2 x/day, 30 to 90 minutes Frequency: Total of 15 hours over 7 days of combined therapies Duration/Length of Stay: 12/06/18 Treatment/Interventions: Functional tasks;Cueing hierarchy;Environmental controls;Internal/external aids;Patient/family education;Cognitive remediation/compensation   Daily Session  Skilled Therapeutic Interventions: Pt was seen for skilled ST targeting cognition. Pt was easily aroused and pleasant upon SLP's arrival to room. She requested opportunity to consume breakfast, therefore Min A verbal cues were provided for basic problem solving, Supervision A for sequencing repositioning herself in  bed to assume upright positioning for safe PO intake. Pt also required cues for safety awareness in terms of identifying when she was semi-reclined vs sitting upright and impact of positioning on safety when  eating. Set up of breakfast tray also provided. SLP further facilitated session with administration of MOCA version 7.1 for diagnostic treatment to reassess pt's new baseline, given recent medical changes but now stable condition and consistent participation. Pt scored 22/30 (26 or > WFL), indicateive of mild impairments in executive function (organization, planning), abstract thinking, problem solving, and more moderate deficits in short term recall, as she required multiple choice or category cues to recall 3 out of 5 words. Results were reviewed with pt and discussed targeted goals for remainder of admission/impact of cognitive impairments on daily functioning. Pt left laying in bed with RN present. Continue per current plan of care.        Pain Pain Assessment Pain Scale: Faces Faces Pain Scale: Hurts a little bit Pain Type: Acute pain Pain Location: Hip Pain Descriptors / Indicators: Aching Pain Onset: On-going Pain Intervention(s): RN made aware;Distraction;Repositioned Multiple Pain Sites: No  Therapy/Group: Individual Therapy  Arbutus Leas 11/23/2019, 11:50 AM

## 2019-11-23 NOTE — Plan of Care (Signed)
  Problem: RH SKIN INTEGRITY Goal: RH STG SKIN FREE OF INFECTION/BREAKDOWN Description: Patients skin will remain free from further infection or breakdown with min assist. Outcome: Not Progressing; c/o pain buttom unstageable

## 2019-11-23 NOTE — Progress Notes (Signed)
[facility] Progress Note    Assessment/ Plan:   1. Recurrent Pseudomonas bacteremia with evidence of mitral valve endocarditisthat is [facility] for infection focus control. Seen by TCTS w/no plan for surgical intervention. Completed 26 days cefepime. Antibiotics changed to [facility] per ID 12/25.  2. Progression L3-4 discitis on MRI. Evaluated by neurosurgery. No indication for surgical intervention. ID has directed ABX and duration. Antibiotics changed to Fortaz/Cipro.  3. ESRD: MWF HD. Next HD 11/23/2018.  3K bath here. Tight heparin.  4. Anemia:hgb 8s. ESA qMon. 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  6. 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 Ongoing inpatient rehabilitation for management of neurological deficits. Repeat MRI showedno acute changes 7. Hypertension/volume : BP low/stable.  Net UF 246ml. UF 0.5 next HD.  8.Nutrition -diet liberalized to regular - has supplements/multivits ordered - alb very low - not eating much - contributing to weakness 9. Ascending colon pneumatosis - seen on CT 12/24. No surgical intervention. 10. Deconditioning -in CIR   Subjective:    NAD, sitting in chair and eating lunch.  Reporting sacral pain.     Objective:   BP (!) 85/60 (BP Location: Left Arm)   Pulse 91   Temp 98.7 F (37.1 C) (Oral)   Resp 16   Ht 5\' 7"  (1.702 m)   Wt 55.4 kg   SpO2 98%   BMI 19.13 kg/m   Intake/Output Summary (Last 24 hours) at 11/23/2019 1525 Last data filed at 11/23/2019 0400 Gross per 24 hour  Intake 300 ml  Output 1000 ml  Net -700 ml   Weight change: 0.7 kg  Physical Exam: Gen: NAD, on L side eating lunch CVS: RRR Resp: clear bilaterally Abd: soft nontender NABS Ext: no LE edema  Imaging: VAS [name] UPPER EXTREMITY VENOUS DUPLEX  Result  Date: 11/22/2019 UPPER VENOUS STUDY  Indications: History of right IJV DVT and paritial thrombus in the brachial vein on 10/21/19. Other Indications: Right sided dialysis cathether in place. Performing Technologist: Oda Cogan RDMS, RVT  Examination Guidelines: A complete evaluation includes B-mode imaging, spectral Doppler, color Doppler, and power Doppler as needed of all accessible portions of each vessel. Bilateral testing is considered an integral part of a complete examination. Limited examinations for reoccurring indications may be performed as noted.  Right Findings: +----------+------------+---------+-----------+----------+--------------+ RIGHT     CompressiblePhasicitySpontaneousProperties   Summary     +----------+------------+---------+-----------+----------+--------------+ IJV         Partial      No        No                  Chronic     +----------+------------+---------+-----------+----------+--------------+ Subclavian    Full       Yes       Yes                             +----------+------------+---------+-----------+----------+--------------+ Axillary      Full       Yes       Yes                             +----------+------------+---------+-----------+----------+--------------+ Brachial      Full       Yes  Yes                             +----------+------------+---------+-----------+----------+--------------+ Radial        Full                                                 +----------+------------+---------+-----------+----------+--------------+ Ulnar         Full                                                 +----------+------------+---------+-----------+----------+--------------+ Cephalic                                            Not visualized +----------+------------+---------+-----------+----------+--------------+ Basilic                                             Not visualized  +----------+------------+---------+-----------+----------+--------------+ Chronic 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  Left Findings: +----------+------------+---------+-----------+----------+-------+ LEFT      CompressiblePhasicitySpontaneousPropertiesSummary +----------+------------+---------+-----------+----------+-------+ Subclavian    Full       Yes       Yes                      +----------+------------+---------+-----------+----------+-------+  Summary:  Right: Findings consistent with chronic deep vein thrombosis involving the right internal jugular vein.  Left: No evidence of thrombosis in the subclavian.  *See table(s) above for measurements and observations.  Diagnosing physician: Monica Martinez MD Electronically signed by Monica Martinez MD on 11/22/2019 at 5:43:38 PM.    Final     Labs: BMET Recent Labs  Lab 11/17/19 0859 11/18/19 1247 11/20/19 1314  NA 136 134* 136  K 3.9 3.9 4.0  CL 96* 97* 99  CO2 23 22 24   GLUCOSE 150* 119* 102*  BUN 30* 43* 28*  CREATININE 5.67* 7.58* 6.32*  CALCIUM 8.5* 8.2* 8.1*  PHOS  --  4.2 3.6   CBC Recent Labs  Lab 11/18/19 1247 11/19/19 1737 11/20/19 1100 11/22/19 1407  WBC 8.7 10.9* 11.1* 13.3*  HGB 7.6* 8.1* 8.6* 8.2*  HCT 24.9* 23.1* 28.3* 26.6*  MCV 92.2 89.5 92.8 91.7  PLT 297 543* 247 231    Medications:    . acetaminophen  500 mg Oral TID  . apixaban  5 mg Oral BID  . calcium carbonate  1 tablet Oral 2 times per day  . carvedilol  12.5 mg Oral BID  . Chlorhexidine Gluconate Cloth  6 each Topical Q0600  . ciprofloxacin  500 mg Oral Q24H  . collagenase   Topical Daily  . darbepoetin (ARANESP) injection - DIALYSIS  100 mcg Intravenous Q Mon-HD  . doxercalciferol  1.5 mcg Intravenous Q M,W,F-HD  . feeding supplement (ENSURE ENLIVE)  237 mL Oral BID BM  . insulin aspart  0-9 Units Subcutaneous TID WC  . isosorbide mononitrate  30 mg Oral QPC lunch  . lactulose  20 g Oral TID  . lidocaine  1  patch Transdermal  Q24H  . Melatonin  3 mg Oral QHS  . multivitamin  1 tablet Oral QPC lunch  . Muscle Rub   Topical BID  . pantoprazole  40 mg Oral Daily  . saccharomyces boulardii  250 mg Oral BID      Madelon Lips, MD 11/23/2019, 3:25 PM

## 2019-11-23 NOTE — Progress Notes (Signed)
Occupational Therapy Session Note  Patient Details  Name: [name] MRN: [id number] Date of Birth: 1951/06/27  Today's Date: 11/23/2019 OT Individual Time: 1302-1330 OT Individual Time Calculation (min): 28 min   Short Term Goals: Week 3:  OT Short Term Goal 1 (Week 3): Pt will perform shower transfer with mod A. OT Short Term Goal 2 (Week 3): Pt will perform toileting with mod A. OT Short Term Goal 3 (Week 3): Pt will complete LB dressing with mod assist OT Short Term Goal 4 (Week 3): Pt will tolerate sitting OOB 2 hrs to demonstrate increased activity tolerance  Skilled Therapeutic Interventions/Progress Updates:    Pt greeted in sidelying in recliner eating lunch and agreeable to OT treatment session. Pt finished eating with set-up A. OT issued 1 lb dowel rod and pt completed 3 sets of 10 bicep curls, straight arm raises, and chest press sitting upright in recliner. Pt reported pain at wound on her bottom and requested to return to bed. Pt completed squat-pivot from recliner back to bed with min A. Pt left sidelying in bed for pressure relief positioning. Bed alarm on, call bell in reach, and needs met.   Therapy Documentation Precautions:  Precautions Precautions: Fall Precaution Comments: Strong L lateral lean, TLSO for mobility Required Braces or Orthoses: Other Brace(TLSO) Spinal Brace: Thoracolumbosacral orthotic Spinal Brace Comments: for comfort with mobility Restrictions Weight Bearing Restrictions: No Pain: Pain Assessment Pain Scale: Faces Faces Pain Scale: Hurts a little bit Pain Type: Acute pain Pain Location: Hip Pain Descriptors / Indicators: Aching Pain Onset: On-going Pain Intervention(s)Distraction;Repositioned Multiple Pain Sites: No   Therapy/Group: Individual Therapy  Valma Cava 11/23/2019, 1:28 PM

## 2019-11-23 NOTE — Progress Notes (Signed)
PROGRESS NOTE    [name]  [id number] DOB: 03-Aug-1951 DOA: 11/01/2019 PCP: Manon Hilding, MD   Brief Narrative: [name] is a 68 y.o. female with a history of ESRD on HD, mitral valve endocarditis, hepatic encephalopathy, chronic anemia, diabetes mellitus, CVA. Patient is currently admitted to inpatient rehab for post-CVA rehabilitation and was found to have worsening mentation.  Assessment & Plan:   Principal Problem:   Cerebrovascular accident (CVA) of right basal ganglia (HCC) Active Problems:   Slow transit constipation   Acute on chronic anemia   Labile blood pressure   Labile blood glucose   Diabetes mellitus type 2 in nonobese (HCC)   Bacteremia   Endocarditis of mitral valve   Septic embolism (HCC)   Seizure (HCC)   Candidiasis   Encephalopathy, hepatic (HCC)   Hyperammonemia (HCC)   Sleep disturbance   Hemodialysis-associated hypotension   Supratherapeutic INR   Gastroesophageal reflux disease   Decubitus ulcer of sacral region, unstageable ([place])   Ogilvie's syndrome   Pneumatosis coli   Subtherapeutic international normalized ratio (INR)   Encephalopathy, resolved Unknown etiology. Thought possibly secondary to delirium. Appears to be improved today but just somnolent possibly secondary to Seroquel overnight. Resolved.  Intractable abdominal pain, resolving  Questionable Ogilvie's syndrome Initially with symptoms on 12/23. Abdominal X-ray concerning for volvulus and CT scan significant for evidence of pneumatosis coli. General surgery consulted and assessment includes likely Olgivie's syndrome. Abdominal pain is improved from earlier with slight right upper quadrant tenderness but markedly improving. -General surgery recommendations: Medical management no indication for surgery -GI recommendations: observation, medical management  Diabetes mellitus, type 2 Patient is on insulin NPH-regular (70-30) 4 units BID WC as an outpatient.  Currently on SSI. Blood sugar controlled. -Continue SSI  Hyperammonemia History of hepatic encephalopathy -Continue lactulose  Discitis L3-L4. In setting of bacterial endocarditis. Patient does have a history of sacral decubitus ulcer. Afebrile and normal WBC. Pelvic x-ray not suggestive of fracture. MRI obtained which shows concern for infectious process. Neurosurgery consulted and their concern is discitis; recommended ID re-consult. Patient has been afebrile and WBC is normal today. Patient currently on Ceftazidime and Ciprofloxacin -Per ID  Coagulopathy Secondary to Coumadin. INR appears to have reached plateau . No obvious source of bleeding. Hemoglobin stable. Given 1 mg of vitamin K with reversal of INR down to 2.1. Unsure of etiology but this could possibly be secondary to vitamin K deficiency.  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 Evaluated by neurology with LT EEG. Gabapentin discontinued. No reported recurrences.   Right IJ DVT Acute from hospitalization in November. Started on Coumadin. -Coumadin -Start heparin for bridge in setting of subtherapeutic INR and recent vitamin K administration  Mitral valve endocarditis Seen by cardiothoracic surgery on medical admission. No plan for replacement valve at this acute period but will need follow-up as an outpatient. Was on Cefepime/cipro, transitioned to Cefepime monotherapy and now is on Ceftazidime and Cipro. TCTS reevaluated MV with limited Transthoracic Echocardiogram on 12/28 -Per ID -TCTS: recommendations pending  Essential hypertension -Continue Imdur  CVA Recently hospitalized and is now undergoing inpatient rehabilitation. Per stroke team, does not appear aspirin was considered for continuation on discharge. -Continue Coumadin  Pressure injury Medial sacrum, not POA    DVT prophylaxis: Coumadin (currently held secondary to supertherapeutic INR) Code Status:   Code Status: DNR Family Communication:  None at bedside   Procedures:   HD  12/28: Limited Transthoracic Echocardiogram IMPRESSIONS    1. Left ventricular  ejection fraction, by visual estimation, is 55 to 60%. The left ventricle has normal function. Left ventricular septal wall thickness was severely increased. Moderately increased left ventricular posterior wall thickness.  2. Elevated left atrial and left ventricular end-diastolic pressures.  3. Left ventricular diastolic parameters are consistent with Grade I diastolic dysfunction (impaired relaxation).  4. Moderate thickening of the anterior mitral valve leaflet(s).  5. Moderate vegetation on the mitral valve.  6. The mitral valve is abnormal. Moderate to severe mitral valve regurgitation.  7. Tricuspid valve regurgitation moderate.  8. Tricuspid valve regurgitation moderate.  9. Moderate thickening. 10. Normal pulmonary artery systolic pressure. 11. A prior study was performed on 10/26/2019. 12. Changes from prior study are noted. 13. Compared to the prior TEE, moderate AMVL endocarditis persists with a mobile mass noted on the anterior leaflet ~0.5 cm in diameter, may be vegetation or ruptured apparatus. There is moderate to severe, posteriorly directed MR. Clinical correlation  with CHF symptoms is recommended.  Antimicrobials:  Cefepime  Ceftazidime   Subjective: No acute issues or events overnight, patient requesting to get out of bed, "tired of being stuck in bed" which reexplain was why she was at physical therapy.  Otherwise declines any abdominal pain, nausea, vomiting, diarrhea, constipation, headache, fevers, chills.  Objective: Vitals:   11/22/19 1700 11/22/19 1722 11/22/19 1919 11/23/19 0602  BP: 103/69 119/60 97/69 113/66  Pulse: 91 90 94 85  Resp:  16 18 18   Temp:  98.2 F (36.8 C) 98.7 F (37.1 C) 98.6 F (37 C)  TempSrc:  Oral  Oral  SpO2:  100%  100%  Weight:  56.6 kg  55.4 kg  Height:        Intake/Output Summary (Last 24 hours)  at 11/23/2019 0802 Last data filed at 11/23/2019 0400 Gross per 24 hour  Intake 300 ml  Output 1000 ml  Net -700 ml   Filed Weights   11/22/19 1345 11/22/19 1722 11/23/19 0602  Weight: 57.8 kg 56.6 kg 55.4 kg    Examination:  General exam: Appears calm and comfortable Respiratory system: Clear to auscultation. Respiratory effort normal. Cardiovascular system: S1 & S2 heard, RRR. 2/6 systolic murmur. Gastrointestinal system: Abdomen is slightly distended, soft and mildly tender in RUQ. Normal bowel sounds heard. Central nervous system: Alert and oriented. No focal neurological deficits. Extremities: No edema. No calf tenderness Skin: No cyanosis. No rashes Psychiatry: Judgement and insight appear normal. Mood & affect appropriate.     Data Reviewed: I have personally reviewed following labs and imaging studies  CBC: Recent Labs  Lab 11/17/19 0859 11/18/19 1247 11/19/19 1737 11/20/19 1100 11/22/19 1407  WBC 9.1 8.7 10.9* 11.1* 13.3*  HGB 8.4* 7.6* 8.1* 8.6* 8.2*  HCT 27.1* 24.9* 23.1* 28.3* 26.6*  MCV 91.2 92.2 89.5 92.8 91.7  PLT 274 297 543* 247 [id number]   Basic Metabolic Panel: Recent Labs  Lab 11/17/19 0859 11/18/19 1247 11/20/19 1314  NA 136 134* 136  K 3.9 3.9 4.0  CL 96* 97* 99  CO2 23 22 24   GLUCOSE 150* 119* 102*  BUN 30* 43* 28*  CREATININE 5.67* 7.58* 6.32*  CALCIUM 8.5* 8.2* 8.1*  PHOS  --  4.2 3.6   GFR: Estimated Creatinine Clearance: 7.3 mL/min (A) (by C-G formula based on SCr of 6.32 mg/dL (H)). Liver Function Tests: Recent Labs  Lab 11/18/19 1247 11/20/19 1314  ALBUMIN 1.7* 1.8*   No results for input(s): LIPASE, AMYLASE in the last 168 hours. No results for input(s): AMMONIA  in the last 168 hours. Coagulation Profile: Recent Labs  Lab 11/19/19 0637 11/20/19 1100 11/21/19 0602 11/22/19 0532 11/23/19 0556  INR 1.5* 2.0* 1.8* 1.7* 3.3*   Cardiac Enzymes: No results for input(s): CKTOTAL, CKMB, CKMBINDEX, TROPONINI in the last 168  hours. BNP (last 3 results) No results for input(s): PROBNP in the last 8760 hours. HbA1C: No results for input(s): HGBA1C in the last 72 hours. CBG: Recent Labs  Lab 11/22/19 0626 11/22/19 1210 11/22/19 1800 11/22/19 2116 11/23/19 0609  GLUCAP 108* 157* 87 117* 116*   Lipid Profile: No results for input(s): CHOL, HDL, LDLCALC, TRIG, CHOLHDL, LDLDIRECT in the last 72 hours. Thyroid Function Tests: No results for input(s): TSH, T4TOTAL, FREET4, T3FREE, THYROIDAB in the last 72 hours. Anemia Panel: No results for input(s): VITAMINB12, FOLATE, FERRITIN, TIBC, IRON, RETICCTPCT in the last 72 hours. Sepsis Labs: No results for input(s): PROCALCITON, LATICACIDVEN in the last 168 hours.  No results found for this or any previous visit (from the past 240 hour(s)).   Radiology Studies: VAS [name] UPPER EXTREMITY VENOUS DUPLEX  Result Date: 11/22/2019 UPPER VENOUS STUDY  Indications: History of right IJV DVT and paritial thrombus in the brachial vein on 10/21/19. Other Indications: Right sided dialysis cathether in place. Performing Technologist: Oda Cogan RDMS, RVT  Examination Guidelines: A complete evaluation includes B-mode imaging, spectral Doppler, color Doppler, and power Doppler as needed of all accessible portions of each vessel. Bilateral testing is considered an integral part of a complete examination. Limited examinations for reoccurring indications may be performed as noted.  Right Findings: +----------+------------+---------+-----------+----------+--------------+ RIGHT     CompressiblePhasicitySpontaneousProperties   Summary     +----------+------------+---------+-----------+----------+--------------+ IJV         Partial      No        No                  Chronic     +----------+------------+---------+-----------+----------+--------------+ Subclavian    Full       Yes       Yes                              +----------+------------+---------+-----------+----------+--------------+ Axillary      Full       Yes       Yes                             +----------+------------+---------+-----------+----------+--------------+ Brachial      Full       Yes       Yes                             +----------+------------+---------+-----------+----------+--------------+ Radial        Full                                                 +----------+------------+---------+-----------+----------+--------------+ Ulnar         Full                                                 +----------+------------+---------+-----------+----------+--------------+ Cephalic  Not visualized +----------+------------+---------+-----------+----------+--------------+ Basilic                                             Not visualized +----------+------------+---------+-----------+----------+--------------+ Chronic 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  Left Findings: +----------+------------+---------+-----------+----------+-------+ LEFT      CompressiblePhasicitySpontaneousPropertiesSummary +----------+------------+---------+-----------+----------+-------+ Subclavian    Full       Yes       Yes                      +----------+------------+---------+-----------+----------+-------+  Summary:  Right: Findings consistent with chronic deep vein thrombosis involving the right internal jugular vein.  Left: No evidence of thrombosis in the subclavian.  *See table(s) above for measurements and observations.  Diagnosing physician: Monica Martinez MD Electronically signed by Monica Martinez MD on 11/22/2019 at 5:43:38 PM.    Final      Scheduled Meds: . acetaminophen  500 mg Oral TID  . apixaban  5 mg Oral BID  . calcium carbonate  1 tablet Oral 2 times per day  . carvedilol  12.5 mg Oral BID  . Chlorhexidine Gluconate Cloth  6 each Topical Q0600  .  ciprofloxacin  500 mg Oral Q24H  . darbepoetin (ARANESP) injection - DIALYSIS  100 mcg Intravenous Q Mon-HD  . doxercalciferol  1.5 mcg Intravenous Q M,W,F-HD  . feeding supplement (ENSURE ENLIVE)  237 mL Oral BID BM  . insulin aspart  0-9 Units Subcutaneous TID WC  . isosorbide mononitrate  30 mg Oral QPC lunch  . lactulose  20 g Oral TID  . lidocaine  1 patch Transdermal Q24H  . Melatonin  3 mg Oral QHS  . multivitamin  1 tablet Oral QPC lunch  . pantoprazole  40 mg Oral Daily  . saccharomyces boulardii  250 mg Oral BID   Continuous Infusions: . sodium chloride    . sodium chloride    . cefTAZidime (FORTAZ)  IV 1 g (11/22/19 2038)  . ferric gluconate (FERRLECIT/NULECIT) IV 125 mg (11/20/19 1753)     LOS: 69 days   Holli Humbles DO [facility]  11/23/2019, 8:02 AM  If 7PM-7AM, please contact night-coverage [email or web address]

## 2019-11-23 NOTE — Consult Note (Signed)
[facility] Nurse Consult Note: Reason for Consult: Consult requested for sacrum wound. Performed remotely after review of the progress notes and nursing flowsheet. Wound type: Unstageable pressure injury to buttocks Pressure Injury POA: No Measurement: 2.5X2.5cm Wound bed: tightly adhered slough Dressing procedure/placement/frequency: Topical treatment orders provided for bedside nurses to perform daily and assist with removal of nonviable tissue: Apply Santyl to buttocks wound Q day, then cover with moist gauze and foam dressing.  (Change foam dressing Q 3 days or PRN soiling.) WOC team will reassess the site weekly and adjust the plan of care at tht time if indicated. Julien Girt MSN, RN, [place], [place], [place]

## 2019-11-23 NOTE — Progress Notes (Signed)
Physical Therapy Session Note  Patient Details  Name: [name] MRN: [id number] Date of Birth: 06/08/1951  Today's Date: 11/23/2019 PT Individual Time: 0800-0900 PT Individual Time Calculation (min): 60 min   Short Term Goals: Week 3:  PT Short Term Goal 1 (Week 3): Pt will transfer supine<>sit and sit<>supine with min A PT Short Term Goal 1 - Progress (Week 3): Met PT Short Term Goal 2 (Week 3): Pt will transfer stand<>pivot with LRAD mod A PT Short Term Goal 2 - Progress (Week 3): Not progressing PT Short Term Goal 3 (Week 3): Pt will perform car transfer with LRAD mod A PT Short Term Goal 3 - Progress (Week 3): Not progressing Week 4:  PT Short Term Goal 1 (Week 4): Pt will transfer supine<>sit and sit<>supine with CGA PT Short Term Goal 2 (Week 4): Pt will transfer stand<>pivot with LRAD mod A PT Short Term Goal 3 (Week 4): Pt will perform car transfer with LRAD mod A  Skilled Therapeutic Interventions/Progress Updates:   Received pt supine in bed, pt agreeable to therapy, and stated pain in L hip with mobility but not at rest. Session focused on functional mobility/trasnfers, LE strength, balance, ambulation with RW, and improved endurance with activity. Pt performed bed mobility with min A x3 trials throughout session. Pt transferred bed<>WC squat<>pivot mod A. Pt transported to gym in [facility] for energy conservation. Pt ambulated 92f with RW mod A +2 for WC follow. Pt demonstrated flexed trunk and decreased stride length and required verbal cues to remain upright and for RW safety. Pt reported increased L hip pain and PT performed seated passive bilateral hamstring stretch 1x45 second hold. Pt reported decreased L hip pain after stretch. Pt ambulated additional 392fwith RW mod A +2 for WC follow. Pt with increased fatigue and demonstrated bilateral knee buckling and requested to sit. Pt transferred squat<>pivot from WC<>mat mod A. Pt performed standing alternating toe taps on cone  x10 with bilateral UE support on RW mod A. Pt refused to perform any more upright activities and transitioned sit<>supine immediately with supervision. Pt performed supine hip abduction with orange TB 2x8 with verbal and tactile cues for technique, supine bridges 2x8, and sidelying bilateral clamshells with orange TB 2x8. Pt required increased time and rest breaks throghout session and max encouragement to participate in therapy despite hip discomfort. [facility]rap[facility]. Concluded session with pt supine in bed, needs within reach, bed alarm on, and RN notified of pt's pain level and administering pain medication.   Therapy Documentation Precautions:  Precautions Precautions: Fall Precaution Comments: Strong L lateral lean, TLSO for mobility Required Braces or Orthoses: Other Brace(TLSO) Spinal Brace: Thoracolumbosacral orthotic Spinal Brace Comments: for comfort with mobility Restrictions Weight Bearing Restrictions: No   Therapy/Group: Individual Therapy AnAlfonse AlpersT, DPT   11/23/2019, 7:25 AM

## 2019-11-24 ENCOUNTER — Inpatient Hospital Stay (HOSPITAL_COMMUNITY): Payer: Medicare PPO

## 2019-11-24 DIAGNOSIS — M7062 Trochanteric bursitis, left hip: Secondary | ICD-10-CM

## 2019-11-24 LAB — RENAL FUNCTION PANEL
Albumin: 1.8 g/dL — ABNORMAL LOW (ref 3.5–5.0)
Anion gap: 12 (ref 5–15)
BUN: 24 mg/dL — ABNORMAL HIGH (ref 8–23)
CO2: 28 mmol/L (ref 22–32)
Calcium: 8.5 mg/dL — ABNORMAL LOW (ref 8.9–10.3)
Chloride: 99 mmol/L (ref 98–111)
Creatinine, Ser: 5.98 mg/dL — ABNORMAL HIGH (ref 0.44–1.00)
GFR calc Af Amer: 8 mL/min — ABNORMAL LOW (ref >60)
GFR calc non Af Amer: 7 mL/min — ABNORMAL LOW (ref >60)
Glucose, Bld: 148 mg/dL — ABNORMAL HIGH (ref 70–99)
Phosphorus: 3.3 mg/dL (ref 2.5–4.6)
Potassium: 3.6 mmol/L (ref 3.5–5.1)
Sodium: 139 mmol/L (ref 135–145)

## 2019-11-24 LAB — CBC WITH DIFFERENTIAL/PLATELET
Abs Immature Granulocytes: 0.52 10*3/uL — ABNORMAL HIGH (ref 0.00–0.07)
Basophils Absolute: 0.1 10*3/uL (ref 0.0–0.1)
Basophils Relative: 1 %
Eosinophils Absolute: 0 10*3/uL (ref 0.0–0.5)
Eosinophils Relative: 0 %
HCT: 28.4 % — ABNORMAL LOW (ref 36.0–46.0)
Hemoglobin: 8.5 g/dL — ABNORMAL LOW (ref 12.0–15.0)
Immature Granulocytes: 5 %
Lymphocytes Relative: 16 %
Lymphs Abs: 1.6 10*3/uL (ref 0.7–4.0)
MCH: 28.1 pg (ref 26.0–34.0)
MCHC: 29.9 g/dL — ABNORMAL LOW (ref 30.0–36.0)
MCV: 94 fL (ref 80.0–100.0)
Monocytes Absolute: 1 10*3/uL (ref 0.1–1.0)
Monocytes Relative: 10 %
Neutro Abs: 6.9 10*3/uL (ref 1.7–7.7)
Neutrophils Relative %: 68 %
Platelets: 255 10*3/uL (ref 150–400)
RBC: 3.02 MIL/uL — ABNORMAL LOW (ref 3.87–5.11)
RDW: 18.9 % — ABNORMAL HIGH (ref 11.5–15.5)
WBC: 10 10*3/uL (ref 4.0–10.5)
nRBC: 0 % (ref 0.0–0.2)

## 2019-11-24 LAB — GLUCOSE, CAPILLARY
Glucose-Capillary: 98 mg/dL (ref 70–99)
Glucose-Capillary: 114 mg/dL — ABNORMAL HIGH (ref 70–99)
Glucose-Capillary: 139 mg/dL — ABNORMAL HIGH (ref 70–99)

## 2019-11-24 MED ORDER — HEPARIN SODIUM (PORCINE) 1000 UNIT/ML IJ SOLN
INTRAMUSCULAR | Status: AC
  Filled 2019-11-24: qty 3

## 2019-11-24 MED ORDER — HEPARIN SODIUM (PORCINE) 1000 UNIT/ML IJ SOLN
3.2000 mL | Freq: Once | INTRAMUSCULAR | Status: AC
  Administered 2019-11-24: 3200 [IU] via INTRAVENOUS

## 2019-11-24 MED ORDER — DICLOFENAC SODIUM 1 % EX GEL
2.0000 g | Freq: Four times a day (QID) | CUTANEOUS | Status: DC
  Administered 2019-11-24 – 2019-12-05 (×29): 2 g via TOPICAL
  Filled 2019-11-24: qty 100

## 2019-11-24 MED ORDER — DOXERCALCIFEROL 4 MCG/2ML IV SOLN
INTRAVENOUS | Status: AC
  Filled 2019-11-24: qty 2

## 2019-11-24 NOTE — Progress Notes (Signed)
[facility] PHYSICAL MEDICINE & REHABILITATION PROGRESS NOTE   Subjective/Complaints: Patient seen laying in bed this morning.  She states she slept fairly overnight because she was repeatedly woken up.  She notes some left.  Trochanteric tenderness.  She also notes pain at her sacral decubitus when she has to lay on it during HD.  ROS: Denies CP, SOB, N/V/D  Objective:   VAS [name] UPPER EXTREMITY VENOUS DUPLEX  Result Date: 11/22/2019 UPPER VENOUS STUDY  Indications: History of right IJV DVT and paritial thrombus in the brachial vein on 10/21/19. Other Indications: Right sided dialysis cathether in place. Performing Technologist: Oda Cogan RDMS, RVT  Examination Guidelines: A complete evaluation includes B-mode imaging, spectral Doppler, color Doppler, and power Doppler as needed of all accessible portions of each vessel. Bilateral testing is considered an integral part of a complete examination. Limited examinations for reoccurring indications may be performed as noted.  Right Findings: +----------+------------+---------+-----------+----------+--------------+ RIGHT     CompressiblePhasicitySpontaneousProperties   Summary     +----------+------------+---------+-----------+----------+--------------+ IJV         Partial      No        No                  Chronic     +----------+------------+---------+-----------+----------+--------------+ Subclavian    Full       Yes       Yes                             +----------+------------+---------+-----------+----------+--------------+ Axillary      Full       Yes       Yes                             +----------+------------+---------+-----------+----------+--------------+ Brachial      Full       Yes       Yes                             +----------+------------+---------+-----------+----------+--------------+ Radial        Full                                                  +----------+------------+---------+-----------+----------+--------------+ Ulnar         Full                                                 +----------+------------+---------+-----------+----------+--------------+ Cephalic                                            Not visualized +----------+------------+---------+-----------+----------+--------------+ Basilic                                             Not visualized +----------+------------+---------+-----------+----------+--------------+ Chronic 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  Left Findings: +----------+------------+---------+-----------+----------+-------+ LEFT      CompressiblePhasicitySpontaneousPropertiesSummary +----------+------------+---------+-----------+----------+-------+  Subclavian    Full       Yes       Yes                      +----------+------------+---------+-----------+----------+-------+  Summary:  Right: Findings consistent with chronic deep vein thrombosis involving the right internal jugular vein.  Left: No evidence of thrombosis in the subclavian.  *See table(s) above for measurements and observations.  Diagnosing physician: Monica Martinez MD Electronically signed by Monica Martinez MD on 11/22/2019 at 5:43:38 PM.    Final    Recent Labs    11/22/19 1407  WBC 13.3*  HGB 8.2*  HCT 26.6*  PLT 231   No results for input(s): NA, K, CL, CO2, GLUCOSE, BUN, CREATININE, CALCIUM in the last 72 hours.  Intake/Output Summary (Last 24 hours) at 11/24/2019 0828 Last data filed at 11/23/2019 1532 Gross per 24 hour  Intake 120 ml  Output --  Net 120 ml     Physical Exam: Vital Signs Blood pressure 110/60, pulse 84, temperature 98.6 F (37 C), temperature source Oral, resp. rate 16, height 5\' 7"  (1.702 m), weight 55.4 kg, SpO2 100 %.  Constitutional: No distress . Vital signs reviewed. HENT: Normocephalic.  Atraumatic. Eyes: EOMI. No discharge. Cardiovascular: No JVD. Respiratory: Normal  effort.  No stridor. GI: Non-distended. Skin: Sacral ulcer not examined today. Psych: Flat. Musc: + TTP left greater trochanteric area. Neuro: Alert and oriented x3 Motor:  RUE: 5/5 proximal to distal RLE: HF, KE 4+/5, ADF 4+/5 (pain inhibition) LUE: 4+/5 proximal to distal LLE: HF, KE 4+/5, ADF 4+/5 (pain inhibition)  Assessment/Plan: 1. Functional deficits secondary to right basal ganglia stroke which require 3+ hours per day of interdisciplinary therapy in a comprehensive inpatient rehab setting.  Physiatrist is providing close team supervision and 24 hour management of active medical problems listed below.  Physiatrist and rehab team continue to assess barriers to discharge/monitor patient progress toward functional and medical goals  Care Tool:  Bathing    Body parts bathed by patient: Right arm, Left arm, Chest, Abdomen, Front perineal area, Right upper leg, Left upper leg, Face   Body parts bathed by helper: Right arm, Left arm, Chest, Abdomen, Front perineal area, Buttocks, Right upper leg, Left upper leg, Right lower leg, Left lower leg     Bathing assist Assist Level: Total Assistance - Patient < 25%     Upper Body Dressing/Undressing Upper body dressing   What is the patient wearing?: Pull over shirt    Upper body assist Assist Level: Moderate Assistance - Patient 50 - 74%    Lower Body Dressing/Undressing Lower body dressing      What is the patient wearing?: Incontinence brief     Lower body assist Assist for lower body dressing: Total Assistance - Patient < 25%     Toileting Toileting    Toileting assist Assist for toileting: Maximal Assistance - Patient 25 - 49%     Transfers Chair/bed transfer  Transfers assist     Chair/bed transfer assist level: Moderate Assistance - Patient 50 - 74%     Locomotion Ambulation   Ambulation assist   Ambulation activity did not occur: Safety/medical concerns(back pain, bilateral LE weakness, poor  bilateral knee control)  Assist level: 2 helpers Assistive device: Walker-rolling Max distance: 42ft   Walk 10 feet activity   Assist  Walk 10 feet activity did not occur: Safety/medical concerns(back pain, bilateral LE weakness, poor bilateral knee control)  Assist level:  2 helpers Assistive device: Walker-rolling   Walk 50 feet activity   Assist Walk 50 feet with 2 turns activity did not occur: Safety/medical concerns(back pain, bilateral LE weakness, poor bilateral knee control)  Assist level: 2 helpers Assistive device: Walker-rolling    Walk 150 feet activity   Assist Walk 150 feet activity did not occur: Safety/medical concerns(back pain, bilateral LE weakness, poor bilateral knee control)         Walk 10 feet on uneven surface  activity   Assist Walk 10 feet on uneven surfaces activity did not occur: Safety/medical concerns(back pain, bilateral LE weakness, poor bilateral knee control)         Wheelchair     Assist Will patient use wheelchair at discharge?: Yes Type of Wheelchair: Manual Wheelchair activity did not occur: Safety/medical concerns(Pt could not tolerate sitting in WC for long enough to complete activity due to low back pain)  Wheelchair assist level: Minimal Assistance - Patient > 75% Max wheelchair distance: 33ft    Wheelchair 50 feet with 2 turns activity    Assist    Wheelchair 50 feet with 2 turns activity did not occur: Safety/medical concerns(Pt could not tolerate sitting in WC for long enough to complete activity due to low back pain)       Wheelchair 150 feet activity     Assist  Wheelchair 150 feet activity did not occur: Safety/medical concerns(Pt could not tolerate sitting in WC for long enough to complete activity due to low back pain)       Blood pressure 110/60, pulse 84, temperature 98.6 F (37 C), temperature source Oral, resp. rate 16, height 5\' 7"  (1.702 m), weight 55.4 kg, SpO2 100 %.    Medical  Problem List and Plan: 1.  Impaired gait with left side weakness secondary to right basal ganglia infarction as well as small cortical infarct left frontal lobe  CT head on 12/13 personally reviewed, showing some improvement, MRI personally reviewed, stable. Repeat MRI ordered again by ID, showing stable infarcts  Chest x-ray personally reviewed, unremarkable for infection  Discussed with neurology results of the EEG -findings possibly related to uremia +/- cefepime  Discussed with psychiatry, continue medical management  Discussed with hospitalist, appreciate recs, discussed with hospitalist again on 12/25  Continue CIR 2.  Antithrombotics: -DVT/anticoagulation: Chronic Coumadin for right IJ acute DVT.  Plan anticoagulation x3 months  Transitioned to Eliquis as it has been very difficult to manage INR    Repeat U/S suggesting chronic DVT             -antiplatelet therapy: N/A 3. Pain Management chronic back and left hip pain: Neurontin 100 mg on hold  TLSO back brace for comfort  Lidocaine patch added to coccyx  Voltaren gel added for left greater trochanteric area on 1/1 4. Mood: Provide emotional support             -antipsychotic agents: N/A 5. Neuropsych: This patient is not capable of making decisions on her own behalf. 6. Skin/Wound Care: Routine skin checks  Santyl to sacral wound - WOC notes reviewed 7. Fluids/Electrolytes/Nutrition: Routine in and outs 8.  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  New HD tunneled catheter placed 10/26/2019.  Repeat echo showing increase in mitral regurg  CT surgery recommending mitral valve replacement- will follow as outpatient. 9.  ID/discitis/recurrent Pseudomonas bacteremia.    Cipro/Maxipime changed to ceftazidime/Cipro through 12/06/2019.  Seen by neurosurgery on 12/25, no surgical intervention  recommended at this point, however recommended following ID  recs  Appreciate ID recs  10.  Hemodialysis MWF.  Follow-up per renal services.  11.  Diabetes mellitus.  Hemoglobin A1c 7.6.  SSI. CBG (last 3)  Recent Labs    11/23/19 1613 11/23/19 2116 11/24/19 0622  GLUCAP 124* 79 98   Labile on 1/1 12.  Hypertension.  Imdur 30 mg daily  Coreg 6.25 mg twice daily, increased to 12.5 on 12/15, parameters placed.    Monitor with increased mobility.  Relatively controlled on 1/1 13.  Acute on chronic anemia.    Hemoglobin 8.2 on 12/30  Continue to monitor 14. Constipation:  See #16  Abd xray showing?  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  Candidiasis  Diflucan ordered on 12/16  Repeat Ucx not collected yet on 1/1 16.  Hyperammonemia with hepatic encephalopathy  Lactulose started on 12/17, encourage compliance  Ammonia WNL on 12/22 17. Sleep disturance  Melatonin added on 12/17  Sleep chart ordered  Improving overall 18.  Leukocytosis  WBCs 13.3 on 12/30, labs with HD  Afebrile  Continue to monitor 19.  GERD  Home Protonix ordered on 12/20 20.  Pneumatosis Coli  CT abdomen/pelvis on 12/24?  Obese.  Appreciate surgery recs-recommending GI recs, no surgical intervention, signed off  Appreciate GI recs, advising against colonoscopy and recommending conservative care at present with repeat imaging in 1-3 months.  LOS: 23 days A FACE TO FACE EVALUATION WAS PERFORMED  Tyleigh Mahn Lorie Phenix 11/24/2019, 8:28 AM

## 2019-11-24 NOTE — Progress Notes (Signed)
PROGRESS NOTE    [name]  [id number] DOB: 19-Jan-1951 DOA: 11/01/2019 PCP: Manon Hilding, MD   Brief Narrative: [name] is a 69 y.o. female with a history of ESRD on HD, mitral valve endocarditis, hepatic encephalopathy, chronic anemia, diabetes mellitus, CVA. Patient is currently admitted to inpatient rehab for post-CVA rehabilitation and was found to have worsening mentation.  Given resolution of symptoms as below will sign off at this time, please reconsult if any issues or events arise in the near future.  Thank you for allowing [name] to participate in patient's care.  Assessment & Plan:   Principal Problem:   Cerebrovascular accident (CVA) of right basal ganglia (HCC) Active Problems:   Slow transit constipation   Acute on chronic anemia   Labile blood pressure   Labile blood glucose   Diabetes mellitus type 2 in nonobese (HCC)   Bacteremia   Endocarditis of mitral valve   Septic embolism (HCC)   Seizure (HCC)   Candidiasis   Encephalopathy, hepatic (HCC)   Hyperammonemia (HCC)   Sleep disturbance   Hemodialysis-associated hypotension   Supratherapeutic INR   Gastroesophageal reflux disease   Decubitus ulcer of sacral region, unstageable ([place])   Ogilvie's syndrome   Pneumatosis coli   Subtherapeutic international normalized ratio (INR)   Encephalopathy, resolved Unknown etiology. Thought possibly secondary to delirium. Appears to be improved today but just somnolent possibly secondary to Seroquel overnight. Resolved.  Intractable abdominal pain, resolving  Questionable Ogilvie's syndrome Initially with symptoms on 12/23. Abdominal X-ray concerning for volvulus and CT scan significant for evidence of pneumatosis coli. General surgery consulted and assessment includes likely Olgivie's syndrome. Abdominal pain is improved from earlier with slight right upper quadrant tenderness but markedly improving. -General surgery recommendations: Medical  management no indication for surgery -GI recommendations: observation, medical management  Diabetes mellitus, type 2 -Patient is on insulin NPH-regular (70-30) 4 units BID WC as an outpatient. -Currently on SSI. Blood sugar controlled. -Continue SSI  Hyperammonemia History of hepatic encephalopathy -Continue lactulose  Discitis L3-L4. In setting of bacterial endocarditis. Patient does have a history of sacral decubitus ulcer. Afebrile and normal WBC. Pelvic x-ray not suggestive of fracture. MRI obtained which shows concern for infectious process. Neurosurgery consulted and their concern is discitis; recommended ID re-consult. Patient has been afebrile and WBC is normal today. Patient currently on Ceftazidime and Ciprofloxacin -Per ID  Coagulopathy Secondary to Coumadin. INR appears to have reached plateau . No obvious source of bleeding. Hemoglobin stable. Given 1 mg of vitamin K with reversal of INR down to 2.1. Unsure of etiology but this could possibly be secondary to vitamin K deficiency.  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 Evaluated by neurology with LT EEG. Gabapentin discontinued. No reported recurrences.   Right IJ DVT Acute from hospitalization in November. Started on Coumadin. -Coumadin -Start heparin for bridge in setting of subtherapeutic INR and recent vitamin K administration  Mitral valve endocarditis Seen by cardiothoracic surgery on medical admission. No plan for replacement valve at this acute period but will need follow-up as an outpatient. Was on Cefepime/cipro, transitioned to Cefepime monotherapy and now is on Ceftazidime and Cipro. TCTS reevaluated MV with limited Transthoracic Echocardiogram on 12/28 -Per ID -TCTS: recommendations pending  Essential hypertension -Continue Imdur  CVA Recently hospitalized and is now undergoing inpatient rehabilitation. Per stroke team, does not appear aspirin was considered for continuation on  discharge. -Continue Coumadin  Pressure injury Medial sacrum, not POA    DVT prophylaxis: Coumadin (  currently held secondary to supertherapeutic INR) Code Status:   Code Status: DNR Family Communication: None at bedside   Procedures:   HD  12/28: Limited Transthoracic Echocardiogram IMPRESSIONS    1. Left ventricular ejection fraction, by visual estimation, is 55 to 60%. The left ventricle has normal function. Left ventricular septal wall thickness was severely increased. Moderately increased left ventricular posterior wall thickness.  2. Elevated left atrial and left ventricular end-diastolic pressures.  3. Left ventricular diastolic parameters are consistent with Grade I diastolic dysfunction (impaired relaxation).  4. Moderate thickening of the anterior mitral valve leaflet(s).  5. Moderate vegetation on the mitral valve.  6. The mitral valve is abnormal. Moderate to severe mitral valve regurgitation.  7. Tricuspid valve regurgitation moderate.  8. Tricuspid valve regurgitation moderate.  9. Moderate thickening. 10. Normal pulmonary artery systolic pressure. 11. A prior study was performed on 10/26/2019. 12. Changes from prior study are noted. 13. Compared to the prior TEE, moderate AMVL endocarditis persists with a mobile mass noted on the anterior leaflet ~0.5 cm in diameter, may be vegetation or ruptured apparatus. There is moderate to severe, posteriorly directed MR. Clinical correlation  with CHF symptoms is recommended.  Antimicrobials:  Cefepime  Ceftazidime   Subjective: No acute issues or events overnight, feels somewhat improved, denies abdominal pain, nausea, vomiting, diarrhea, constipation, headache, fevers, chills.  Objective: Vitals:   11/23/19 1411 11/23/19 2023 11/24/19 0506 11/24/19 0606  BP: (!) 85/60 131/88 (!) 107/92 110/60  Pulse: 91 89 84   Resp: 16 16 16    Temp: 98.7 F (37.1 C) 98.2 F (36.8 C) 98.6 F (37 C)   TempSrc: Oral Oral  Oral   SpO2: 98% 100% 100%   Weight:      Height:        Intake/Output Summary (Last 24 hours) at 11/24/2019 0742 Last data filed at 11/23/2019 1532 Gross per 24 hour  Intake 120 ml  Output --  Net 120 ml   Filed Weights   11/22/19 1345 11/22/19 1722 11/23/19 0602  Weight: 57.8 kg 56.6 kg 55.4 kg    Examination:  General exam: Appears calm and comfortable Respiratory system: Clear to auscultation. Respiratory effort normal. Cardiovascular system: S1 & S2 heard, RRR. 2/6 systolic murmur. Gastrointestinal system: Abdomen is slightly distended, soft and mildly tender in RUQ. Normal bowel sounds heard. Central nervous system: Alert and oriented. No focal neurological deficits. Extremities: No edema. No calf tenderness Skin: No cyanosis. No rashes Psychiatry: Judgement and insight appear normal. Mood & affect appropriate.     Data Reviewed: I have personally reviewed following labs and imaging studies  CBC: Recent Labs  Lab 11/17/19 0859 11/18/19 1247 11/19/19 1737 11/20/19 1100 11/22/19 1407  WBC 9.1 8.7 10.9* 11.1* 13.3*  HGB 8.4* 7.6* 8.1* 8.6* 8.2*  HCT 27.1* 24.9* 23.1* 28.3* 26.6*  MCV 91.2 92.2 89.5 92.8 91.7  PLT 274 297 543* 247 [id number]   Basic Metabolic Panel: Recent Labs  Lab 11/17/19 0859 11/18/19 1247 11/20/19 1314  NA 136 134* 136  K 3.9 3.9 4.0  CL 96* 97* 99  CO2 23 22 24   GLUCOSE 150* 119* 102*  BUN 30* 43* 28*  CREATININE 5.67* 7.58* 6.32*  CALCIUM 8.5* 8.2* 8.1*  PHOS  --  4.2 3.6   GFR: Estimated Creatinine Clearance: 7.3 mL/min (A) (by C-G formula based on SCr of 6.32 mg/dL (H)). Liver Function Tests: Recent Labs  Lab 11/18/19 1247 11/20/19 1314  ALBUMIN 1.7* 1.8*   No results for  input(s): LIPASE, AMYLASE in the last 168 hours. No results for input(s): AMMONIA in the last 168 hours. Coagulation Profile: Recent Labs  Lab 11/19/19 0637 11/20/19 1100 11/21/19 0602 11/22/19 0532 11/23/19 0556  INR 1.5* 2.0* 1.8* 1.7* 3.3*    Cardiac Enzymes: No results for input(s): CKTOTAL, CKMB, CKMBINDEX, TROPONINI in the last 168 hours. BNP (last 3 results) No results for input(s): PROBNP in the last 8760 hours. HbA1C: No results for input(s): HGBA1C in the last 72 hours. CBG: Recent Labs  Lab 11/23/19 0609 11/23/19 1123 11/23/19 1613 11/23/19 2116 11/24/19 0622  GLUCAP 116* 189* 124* 79 98   Lipid Profile: No results for input(s): CHOL, HDL, LDLCALC, TRIG, CHOLHDL, LDLDIRECT in the last 72 hours. Thyroid Function Tests: No results for input(s): TSH, T4TOTAL, FREET4, T3FREE, THYROIDAB in the last 72 hours. Anemia Panel: No results for input(s): VITAMINB12, FOLATE, FERRITIN, TIBC, IRON, RETICCTPCT in the last 72 hours. Sepsis Labs: No results for input(s): PROCALCITON, LATICACIDVEN in the last 168 hours.  No results found for this or any previous visit (from the past 240 hour(s)).   Radiology Studies: VAS [name] UPPER EXTREMITY VENOUS DUPLEX  Result Date: 11/22/2019 UPPER VENOUS STUDY  Indications: History of right IJV DVT and paritial thrombus in the brachial vein on 10/21/19. Other Indications: Right sided dialysis cathether in place. Performing Technologist: Oda Cogan RDMS, RVT  Examination Guidelines: A complete evaluation includes B-mode imaging, spectral Doppler, color Doppler, and power Doppler as needed of all accessible portions of each vessel. Bilateral testing is considered an integral part of a complete examination. Limited examinations for reoccurring indications may be performed as noted.  Right Findings: +----------+------------+---------+-----------+----------+--------------+ RIGHT     CompressiblePhasicitySpontaneousProperties   Summary     +----------+------------+---------+-----------+----------+--------------+ IJV         Partial      No        No                  Chronic     +----------+------------+---------+-----------+----------+--------------+ Subclavian    Full       Yes        Yes                             +----------+------------+---------+-----------+----------+--------------+ Axillary      Full       Yes       Yes                             +----------+------------+---------+-----------+----------+--------------+ Brachial      Full       Yes       Yes                             +----------+------------+---------+-----------+----------+--------------+ Radial        Full                                                 +----------+------------+---------+-----------+----------+--------------+ Ulnar         Full                                                 +----------+------------+---------+-----------+----------+--------------+  Cephalic                                            Not visualized +----------+------------+---------+-----------+----------+--------------+ Basilic                                             Not visualized +----------+------------+---------+-----------+----------+--------------+ Chronic 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  Left Findings: +----------+------------+---------+-----------+----------+-------+ LEFT      CompressiblePhasicitySpontaneousPropertiesSummary +----------+------------+---------+-----------+----------+-------+ Subclavian    Full       Yes       Yes                      +----------+------------+---------+-----------+----------+-------+  Summary:  Right: Findings consistent with chronic deep vein thrombosis involving the right internal jugular vein.  Left: No evidence of thrombosis in the subclavian.  *See table(s) above for measurements and observations.  Diagnosing physician: Monica Martinez MD Electronically signed by Monica Martinez MD on 11/22/2019 at 5:43:38 PM.    Final      Scheduled Meds: . acetaminophen  500 mg Oral TID  . apixaban  5 mg Oral BID  . calcium carbonate  1 tablet Oral 2 times per day  . carvedilol  12.5 mg Oral BID  . Chlorhexidine Gluconate  Cloth  6 each Topical Q0600  . ciprofloxacin  500 mg Oral Q24H  . collagenase   Topical Daily  . darbepoetin (ARANESP) injection - DIALYSIS  100 mcg Intravenous Q Mon-HD  . doxercalciferol  1.5 mcg Intravenous Q M,W,F-HD  . feeding supplement (ENSURE ENLIVE)  237 mL Oral BID BM  . insulin aspart  0-9 Units Subcutaneous TID WC  . isosorbide mononitrate  30 mg Oral QPC lunch  . lactulose  20 g Oral TID  . lidocaine  1 patch Transdermal Q24H  . Melatonin  3 mg Oral QHS  . multivitamin  1 tablet Oral QPC lunch  . Muscle Rub   Topical BID  . pantoprazole  40 mg Oral Daily  . saccharomyces boulardii  250 mg Oral BID   Continuous Infusions: . sodium chloride    . sodium chloride    . cefTAZidime (FORTAZ)  IV 1 g (11/23/19 2050)  . ferric gluconate (FERRLECIT/NULECIT) IV 125 mg (11/20/19 1753)     LOS: 23 days   [place] Hospitalist  11/24/2019, 7:42 AM  If 7PM-7AM, please contact night-coverage [email or web address]

## 2019-11-24 NOTE — Procedures (Signed)
Patient seen and examined on Hemodialysis. BP 124/74   Pulse 89   Temp 98.4 F (36.9 C) (Oral)   Resp 16   Ht 5\' 7"  (1.702 m)   Wt 56 kg   SpO2 100%   BMI 19.34 kg/m   QB 400 mL/ min UF goal 1.5L  Tolerating treatment without complaints at this time.  Madelon Lips MD 3:18 PM

## 2019-11-24 NOTE — Progress Notes (Signed)
Patient continues to refuse lactulose.

## 2019-11-24 NOTE — Progress Notes (Signed)
[facility] Progress Note    Assessment/ Plan:   1. Recurrent Pseudomonas bacteremia with evidence of mitral valve endocarditisthat is post [facility] for infection focus control. Seen by TCTS w/no plan for surgical intervention. Completed 26 days cefepime. Antibiotics changed to [facility] per ID 12/25.  2. Progression L3-4 discitis on MRI. Evaluated by neurosurgery. No indication for surgical intervention. ID has directed ABX and duration. Antibiotics changed to Fortaz/Cipro.  3. ESRD: MWF HD. Next HD 11/23/2018.  3K bath here. Tight heparin.  4. Anemia:hgb 8s. ESA qMon. 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  6. 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 Ongoing inpatient rehabilitation for management of neurological deficits. Repeat MRI showedno acute changes 7. Hypertension/volume : BP low/stable.   8.Nutrition -diet liberalized to regular - has supplements/multivits ordered - alb very low - not eating much - contributing to weakness 9. Ascending colon pneumatosis - seen on CT 12/24. No surgical intervention. 10. Deconditioning -in CIR   Subjective:    Seen on HD.  Got nauseated/ sick after a banana with cereal didn't agree with her Objective:   BP 124/74   Pulse 89   Temp 98.4 F (36.9 C) (Oral)   Resp 16   Ht 5\' 7"  (1.702 m)   Wt 56 kg   SpO2 100%   BMI 19.34 kg/m   Intake/Output Summary (Last 24 hours) at 11/24/2019 1516 Last data filed at 11/23/2019 1532 Gross per 24 hour  Intake 120 ml  Output --  Net 120 ml   Weight change:   Physical Exam: Gen: NAD, on L side on HD CVS: RRR Resp: clear bilaterally Abd: soft nontender NABS Ext: no LE edema  Imaging: No results found.  Labs: BMET Recent Labs  Lab 11/18/19 1247 11/20/19 1314 11/24/19 1231  NA 134* 136 139   K 3.9 4.0 3.6  CL 97* 99 99  CO2 22 24 28   GLUCOSE 119* 102* 148*  BUN 43* 28* 24*  CREATININE 7.58* 6.32* 5.98*  CALCIUM 8.2* 8.1* 8.5*  PHOS 4.2 3.6 3.3   CBC Recent Labs  Lab 11/19/19 1737 11/20/19 1100 11/22/19 1407 11/24/19 1230  WBC 10.9* 11.1* 13.3* 10.0  NEUTROABS  --   --   --  6.9  HGB 8.1* 8.6* 8.2* 8.5*  HCT 23.1* 28.3* 26.6* 28.4*  MCV 89.5 92.8 91.7 94.0  PLT 543* 247 231 255    Medications:    . acetaminophen  500 mg Oral TID  . apixaban  5 mg Oral BID  . calcium carbonate  1 tablet Oral 2 times per day  . carvedilol  12.5 mg Oral BID  . Chlorhexidine Gluconate Cloth  6 each Topical Q0600  . ciprofloxacin  500 mg Oral Q24H  . collagenase   Topical Daily  . darbepoetin (ARANESP) injection - DIALYSIS  100 mcg Intravenous Q Mon-HD  . diclofenac Sodium  2 g Topical QID  . doxercalciferol  1.5 mcg Intravenous Q M,W,F-HD  . feeding supplement (ENSURE ENLIVE)  237 mL Oral BID BM  . insulin aspart  0-9 Units Subcutaneous TID WC  . isosorbide mononitrate  30 mg Oral QPC lunch  . lactulose  20 g Oral TID  . lidocaine  1 patch Transdermal Q24H  . Melatonin  3 mg Oral QHS  . multivitamin  1 tablet Oral QPC lunch  .  Muscle Rub   Topical BID  . pantoprazole  40 mg Oral Daily  . saccharomyces boulardii  250 mg Oral BID      Madelon Lips, MD 11/24/2019, 3:16 PM

## 2019-11-24 NOTE — Progress Notes (Signed)
Physical Therapy Session Note  Patient Details  Name: Laticha Ferrucci MRN: [id number] Date of Birth: 1950/11/27  Today's Date: 11/24/2019 PT Individual Time: 0900-0945 PT Individual Time Calculation (min): 45 min   Short Term Goals: Week 3:  PT Short Term Goal 1 (Week 3): Pt will transfer supine<>sit and sit<>supine with min A PT Short Term Goal 1 - Progress (Week 3): Met PT Short Term Goal 2 (Week 3): Pt will transfer stand<>pivot with LRAD mod A PT Short Term Goal 2 - Progress (Week 3): Not progressing PT Short Term Goal 3 (Week 3): Pt will perform car transfer with LRAD mod A PT Short Term Goal 3 - Progress (Week 3): Not progressing Week 4:  PT Short Term Goal 1 (Week 4): Pt will transfer supine<>sit and sit<>supine with CGA PT Short Term Goal 2 (Week 4): Pt will transfer stand<>pivot with LRAD mod A PT Short Term Goal 3 (Week 4): Pt will perform car transfer with LRAD mod A  Skilled Therapeutic Interventions/Progress Updates:   Received pt supine in bed, pt agreeable to therapy, and reported intense low back and bilateral hip pain stating "It hurts so bad I can't move". Pt declined for therapist to get RN, stating she just received pain medication and muscle rub ream prior to start of therapy. Session focused on bed mobility, LE strength, stretching and repositioning to reduce pain. PT retrieved hot pack for pt to use during session. Therapist performed bilateral supine passive hip oscillations into increased hip abduction, pt reported decreased pain and able to tolerance increased ROM. Therapist performed PROM into hip flexion in supine and into hip extension in sidelying with verbal cues for pt to relax and for breathing techniques. Pt performed bilateral LTR x 5 for 20 second hold with verbal cues for technique and breathing. Pt performed supine pelvic tilts 1x10 and 1x20 due to improved pain on tailbone from pressure relief component of exercise. Pt continued to c/o discomfort on  tailbone and therapist assisted in repositioning into prone with min A for UE management and verbal cues for rolling into prone. Pt reported decreased discomfort on tailbone but stated she couldn't stay in prone long because she felt "smothered". Pt transferred prone<>sidelying min A for rolling. Pt requested to eat cereal and drink milk. Therapist provided min A for pt to eat cereal due to increased pain and unwillingness to sit EOB. Concluded session with pt sidelying in bed for pressure relief, needs within reach, and bed alarm on.    Therapy Documentation Precautions:  Precautions Precautions: Fall Precaution Comments: Strong L lateral lean, TLSO for mobility Required Braces or Orthoses: Other Brace(TLSO) Spinal Brace: Thoracolumbosacral orthotic Spinal Brace Comments: for comfort with mobility Restrictions Weight Bearing Restrictions: No   Therapy/Group: Individual Therapy Alfonse Alpers PT, DPT   11/24/2019, 7:37 AM

## 2019-11-25 ENCOUNTER — Inpatient Hospital Stay (HOSPITAL_COMMUNITY): Payer: Medicare PPO | Admitting: Physical Therapy

## 2019-11-25 ENCOUNTER — Inpatient Hospital Stay (HOSPITAL_COMMUNITY): Payer: Medicare PPO | Admitting: Occupational Therapy

## 2019-11-25 LAB — GLUCOSE, CAPILLARY
Glucose-Capillary: 102 mg/dL — ABNORMAL HIGH (ref 70–99)
Glucose-Capillary: 120 mg/dL — ABNORMAL HIGH (ref 70–99)
Glucose-Capillary: 148 mg/dL — ABNORMAL HIGH (ref 70–99)
Glucose-Capillary: 142 mg/dL — ABNORMAL HIGH (ref 70–99)

## 2019-11-25 NOTE — Plan of Care (Signed)
  Problem: Consults Goal: RH STROKE PATIENT EDUCATION Description: See Patient Education module for education specifics  Outcome: Progressing Goal: Nutrition Consult-if indicated Outcome: Progressing Goal: Diabetes Guidelines if Diabetic/Glucose > 140 Description: If diabetic or lab glucose is > 140 mg/dl - Initiate Diabetes/Hyperglycemia Guidelines & Document Interventions  Outcome: Progressing   Problem: RH BOWEL ELIMINATION Goal: RH STG MANAGE BOWEL WITH ASSISTANCE Description: STG Manage Bowel with mod I/min Assistance. Outcome: Progressing Goal: RH STG MANAGE BOWEL W/MEDICATION W/ASSISTANCE Description: STG Manage Bowel with Medication with mod I Assistance. Outcome: Progressing   Problem: RH SKIN INTEGRITY Goal: RH STG SKIN FREE OF INFECTION/BREAKDOWN Description: Patients skin will remain free from further infection or breakdown with min assist. Outcome: Progressing Goal: RH STG MAINTAIN SKIN INTEGRITY WITH ASSISTANCE Description: STG Maintain Skin Integrity With min Assistance. Outcome: Progressing   Problem: RH SAFETY Goal: RH STG ADHERE TO SAFETY PRECAUTIONS W/ASSISTANCE/DEVICE Description: STG Adhere to Safety Precautions With supervision/min Assistance/Device. Outcome: Progressing Goal: RH STG DECREASED RISK OF FALL WITH ASSISTANCE Description: STG Decreased Risk of Fall With supervision/min Assistance. Outcome: Progressing   Problem: RH PAIN MANAGEMENT Goal: RH STG PAIN MANAGED AT OR BELOW PT'S PAIN GOAL Description: < 5 Outcome: Progressing   Problem: RH KNOWLEDGE DEFICIT Goal: RH STG INCREASE KNOWLEDGE OF DIABETES Description: Patient/caregiver will verbalize understanding of DM including diet, exercise, medications, monitoring, and follow up care with min assist. Outcome: Progressing Goal: RH STG INCREASE KNOWLEDGE OF HYPERTENSION Description: Patient/caregiver will verbalize understanding of HTN including diet, exercise, medications, monitoring, and follow  up care with min assist. Outcome: Progressing Goal: RH STG INCREASE KNOWLEGDE OF HYPERLIPIDEMIA Description: Patient/caregiver will verbalize understanding of HLD including diet, exercise, medications, monitoring, and follow up care with min assist. Outcome: Progressing Goal: RH STG INCREASE KNOWLEDGE OF STROKE PROPHYLAXIS Description: Patient/caregiver will verbalize understanding of stroke prophylaxis including diet, exercise, medications, monitoring, and follow up care with min assist. Outcome: Progressing

## 2019-11-25 NOTE — Progress Notes (Signed)
[place] PHYSICAL MEDICINE & REHABILITATION PROGRESS NOTE   Subjective/Complaints:   Pt laying supine in bed- about to get into shower with OT- excited about shower.  Said needs to wash hair due to wounds on head.  ROS: Denies CP, SOB, N/V/D  Objective:   No results found. Recent Labs    11/22/19 1407 11/24/19 1230  WBC 13.3* 10.0  HGB 8.2* 8.5*  HCT 26.6* 28.4*  PLT 231 255   Recent Labs    11/24/19 1231  NA 139  K 3.6  CL 99  CO2 28  GLUCOSE 148*  BUN 24*  CREATININE 5.98*  CALCIUM 8.5*    Intake/Output Summary (Last 24 hours) at 11/25/2019 1159 Last data filed at 11/25/2019 0318 Gross per 24 hour  Intake 120 ml  Output 1001 ml  Net -881 ml     Physical Exam: Vital Signs Blood pressure 118/82, pulse 91, temperature 98.6 F (37 C), resp. rate 18, height 5\' 7"  (1.702 m), weight 54.5 kg, SpO2 100 %.  Constitutional: No distress . Vital signs reviewed. HENT: Normocephalic.  Atraumatic. Eyes: EOMI. No discharge. Cardiovascular: No JVD. Respiratory: Normal effort.  No stridor. GI: Non-distended. Skin: Sacral ulcer not examined today. Psych: excited about shower Musc: + TTP left greater trochanteric area. Neuro: Alert and oriented x3 Motor:  RUE: 5/5 proximal to distal RLE: HF, KE 4+/5, ADF 4+/5 (pain inhibition) LUE: 4+/5 proximal to distal LLE: HF, KE 4+/5, ADF 4+/5 (pain inhibition)  Assessment/Plan: 1. Functional deficits secondary to right basal ganglia stroke which require 3+ hours per day of interdisciplinary therapy in a comprehensive inpatient rehab setting.  Physiatrist is providing close team supervision and 24 hour management of active medical problems listed below.  Physiatrist and rehab team continue to assess barriers to discharge/monitor patient progress toward functional and medical goals  Care Tool:  Bathing    Body parts bathed by patient: Right arm, Left arm, Chest, Abdomen, Front perineal area, Right upper leg, Left upper leg,  Face, Right lower leg, Left lower leg   Body parts bathed by helper: Buttocks     Bathing assist Assist Level: Minimal Assistance - Patient > 75%     Upper Body Dressing/Undressing Upper body dressing   What is the patient wearing?: Dress    Upper body assist Assist Level: Minimal Assistance - Patient > 75%    Lower Body Dressing/Undressing Lower body dressing      What is the patient wearing?: Incontinence brief     Lower body assist Assist for lower body dressing: Total Assistance - Patient < 25%     Toileting Toileting    Toileting assist Assist for toileting: Maximal Assistance - Patient 25 - 49%     Transfers Chair/bed transfer  Transfers assist     Chair/bed transfer assist level: Moderate Assistance - Patient 50 - 74%     Locomotion Ambulation   Ambulation assist   Ambulation activity did not occur: Safety/medical concerns(back pain, bilateral LE weakness, poor bilateral knee control)  Assist level: 2 helpers Assistive device: Walker-rolling Max distance: 74ft   Walk 10 feet activity   Assist  Walk 10 feet activity did not occur: Safety/medical concerns(back pain, bilateral LE weakness, poor bilateral knee control)  Assist level: 2 helpers Assistive device: Walker-rolling   Walk 50 feet activity   Assist Walk 50 feet with 2 turns activity did not occur: Safety/medical concerns(back pain, bilateral LE weakness, poor bilateral knee control)  Assist level: 2 helpers Assistive device: [employer]  Walk 150 feet activity   Assist Walk 150 feet activity did not occur: Safety/medical concerns(back pain, bilateral LE weakness, poor bilateral knee control)         Walk 10 feet on uneven surface  activity   Assist Walk 10 feet on uneven surfaces activity did not occur: Safety/medical concerns(back pain, bilateral LE weakness, poor bilateral knee control)         Wheelchair     Assist Will patient use wheelchair at  discharge?: Yes Type of Wheelchair: Manual Wheelchair activity did not occur: Safety/medical concerns(Pt could not tolerate sitting in WC for long enough to complete activity due to low back pain)  Wheelchair assist level: Minimal Assistance - Patient > 75% Max wheelchair distance: 12ft    Wheelchair 50 feet with 2 turns activity    Assist    Wheelchair 50 feet with 2 turns activity did not occur: Safety/medical concerns(Pt could not tolerate sitting in WC for long enough to complete activity due to low back pain)       Wheelchair 150 feet activity     Assist  Wheelchair 150 feet activity did not occur: Safety/medical concerns(Pt could not tolerate sitting in WC for long enough to complete activity due to low back pain)       Blood pressure 118/82, pulse 91, temperature 98.6 F (37 C), resp. rate 18, height 5\' 7"  (1.702 m), weight 54.5 kg, SpO2 100 %.    Medical Problem List and Plan: 1.  Impaired gait with left side weakness secondary to right basal ganglia infarction as well as small cortical infarct left frontal lobe  CT head on 12/13 personally reviewed, showing some improvement, MRI personally reviewed, stable. Repeat MRI ordered again by ID, showing stable infarcts  Chest x-ray personally reviewed, unremarkable for infection  Discussed with neurology results of the EEG -findings possibly related to uremia +/- cefepime  Discussed with psychiatry, continue medical management  Discussed with hospitalist, appreciate recs, discussed with hospitalist again on 12/25  Continue CIR 2.  Antithrombotics: -DVT/anticoagulation: Chronic Coumadin for right IJ acute DVT.  Plan anticoagulation x3 months  Transitioned to Eliquis as it has been very difficult to manage INR    Repeat U/S suggesting chronic DVT             -antiplatelet therapy: N/A 3. Pain Management chronic back and left hip pain: Neurontin 100 mg on hold  TLSO back brace for comfort  Lidocaine patch added to  coccyx  Voltaren gel added for left greater trochanteric area on 1/1 4. Mood: Provide emotional support             -antipsychotic agents: N/A 5. Neuropsych: This patient is not capable of making decisions on her own behalf. 6. Skin/Wound Care: Routine skin checks  Santyl to sacral wound - WOC notes reviewed 7. Fluids/Electrolytes/Nutrition: Routine in and outs 8.  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  New HD tunneled catheter placed 10/26/2019.  Repeat echo showing increase in mitral regurg  CT surgery recommending mitral valve replacement- will follow as outpatient. 9.  ID/discitis/recurrent Pseudomonas bacteremia.    Cipro/Maxipime changed to ceftazidime/Cipro through 12/06/2019.  Seen by neurosurgery on 12/25, no surgical intervention recommended at this point, however recommended following ID recs  Appreciate ID recs  10.  Hemodialysis MWF.  Follow-up per renal services.  11.  Diabetes mellitus.  Hemoglobin A1c 7.6.  SSI. CBG (last 3)  Recent Labs    11/24/19 2103 11/25/19 [id number]  11/25/19 1153  GLUCAP 139* 102* 120*   Controlled 1/2 12.  Hypertension.  Imdur 30 mg daily  Coreg 6.25 mg twice daily, increased to 12.5 on 12/15, parameters placed.    Monitor with increased mobility.  Relatively controlled on 1/1 13.  Acute on chronic anemia.    Hemoglobin 8.2 on 12/30  Continue to monitor 14. Constipation:  See #16  Abd xray showing?  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  Candidiasis  Diflucan ordered on 12/16  Repeat Ucx not collected yet on 1/1 16.  Hyperammonemia with hepatic encephalopathy  Lactulose started on 12/17, encourage compliance  Ammonia WNL on 12/22 17. Sleep disturance  Melatonin added on 12/17  Sleep chart ordered  Improving overall 18.  Leukocytosis  WBCs 13.3 on 12/30, labs with HD  1/1- WBC down to 10k  Afebrile  Continue to  monitor 19.  GERD  Home Protonix ordered on 12/20 20.  Pneumatosis Coli  CT abdomen/pelvis on 12/24?  Obese.  Appreciate surgery recs-recommending GI recs, no surgical intervention, signed off  Appreciate GI recs, advising against colonoscopy and recommending conservative care at present with repeat imaging in 1-3 months.  LOS: 24 days A FACE TO FACE EVALUATION WAS PERFORMED  [name] 11/25/2019, 11:59 AM

## 2019-11-25 NOTE — Progress Notes (Addendum)
[facility] Progress Note    Assessment/ Plan:   1. Pseudomonas MV endocarditis/ bacteremia: HD cath exchanged for infection focus control. Seen by TCTS w/ no plan for surgical intervention. Completed 26 days cefepime. Antibiotics changed to [facility] per ID 12/25.  2. Progression L3-4 discitis on MRI. Evaluated by neurosurgery. No indication for surgical intervention. ID has directed ABX and duration. Antibiotics changed to Fortaz/Cipro.  3. ESRD: MWF HD. ESRD x 1 year, tried AVF "6 times" w/o success. TDC dependent. Next HD Monday. 3K bath here. Tight heparin.  4. Anemia:hgb 8s. ESA qMon. 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  6. 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 Ongoing inpatient rehabilitation for management of neurological deficits. Repeat MRI showedno acute changes 7. Hypertension/volume : BP low/stable. No vol^ on exam.  8.Nutrition -diet liberalized to regular - has supplements/multivits ordered - alb very low - not eating much - contributing to weakness 9. Ascending colon pneumatosis - seen on CT 12/24. No surgical intervention. 10. Deconditioning -in CIR   Kelly Splinter, MD 11/25/2019, 10:33 AM    Subjective:    Seen in room, no c/o today. No SOB, CP or abd pain.  Objective:   BP 118/82   Pulse 91   Temp 98.6 F (37 C)   Resp 18   Ht 5\' 7"  (1.702 m)   Wt 54.5 kg   SpO2 100%   BMI 18.82 kg/m   Intake/Output Summary (Last 24 hours) at 11/25/2019 1031 Last data filed at 11/25/2019 0318 Gross per 24 hour  Intake 120 ml  Output 1001 ml  Net -881 ml   Weight change:   HD Outpatient: [facility] MWF  3.5h  TDC (mult AVF attempts failed)  62.5kg  2/2.5 bath 400/600 - hect 1.5 ug - EPO 1200u each HD, iron 50mg  weekly  Physical Exam: Gen: NAD, on L side on HD CVS:  RRR Resp: clear bilaterally Abd: soft nontender NABS Ext: no LE edema  Imaging: No results found.  Labs: BMET Recent Labs  Lab 11/18/19 1247 11/20/19 1314 11/24/19 1231  NA 134* 136 139  K 3.9 4.0 3.6  CL 97* 99 99  CO2 22 24 28   GLUCOSE 119* 102* 148*  BUN 43* 28* 24*  CREATININE 7.58* 6.32* 5.98*  CALCIUM 8.2* 8.1* 8.5*  PHOS 4.2 3.6 3.3   CBC Recent Labs  Lab 11/19/19 1737 11/20/19 1100 11/22/19 1407 11/24/19 1230  WBC 10.9* 11.1* 13.3* 10.0  NEUTROABS  --   --   --  6.9  HGB 8.1* 8.6* 8.2* 8.5*  HCT 23.1* 28.3* 26.6* 28.4*  MCV 89.5 92.8 91.7 94.0  PLT 543* 247 231 255    Medications:    . acetaminophen  500 mg Oral TID  . apixaban  5 mg Oral BID  . calcium carbonate  1 tablet Oral 2 times per day  . carvedilol  12.5 mg Oral BID  . Chlorhexidine Gluconate Cloth  6 each Topical Q0600  . ciprofloxacin  500 mg Oral Q24H  . collagenase   Topical Daily  . darbepoetin (ARANESP) injection - DIALYSIS  100 mcg Intravenous Q Mon-HD  . diclofenac Sodium  2 g Topical QID  . doxercalciferol  1.5 mcg Intravenous Q M,W,F-HD  . feeding supplement (ENSURE ENLIVE)  237 mL Oral BID BM  . insulin aspart  0-9 Units  Subcutaneous TID WC  . isosorbide mononitrate  30 mg Oral QPC lunch  . lactulose  20 g Oral TID  . lidocaine  1 patch Transdermal Q24H  . Melatonin  3 mg Oral QHS  . multivitamin  1 tablet Oral QPC lunch  . Muscle Rub   Topical BID  . pantoprazole  40 mg Oral Daily  . saccharomyces boulardii  250 mg Oral BID

## 2019-11-25 NOTE — Progress Notes (Signed)
Physical Therapy Session Note  Patient Details  Name: [name] MRN: [id number] Date of Birth: 1951-11-03  Today's Date: 11/25/2019 PT Missed Time: 80 Minutes Missed Time Reason: Unavailable (Comment)(Patient off the floor for dialysis)  Patient missed 60 minutes of skilled physical therapy due to pt being off the floor for dialysis.  Tawana Scale, PT, DPT 11/25/2019, 12:57 PM

## 2019-11-25 NOTE — Progress Notes (Signed)
Occupational Therapy Session Note  Patient Details  Name: [name] MRN: [id number] Date of Birth: 02-Sep-1951  Today's Date: 11/25/2019 OT Individual Time: 0900-0959 OT Individual Time Calculation (min): 59 min   Skilled Therapeutic Interventions/Progress Updates:    Pt greeted in bed, requesting to take a shower. She did not want to wear the back brace during transfers and stated she typically completes shower transfers using [place]. Min A for sit<stand in [place] from elevated bed. While she was seated on shower chair, OT covered wrist IV and also HD port. Provided her with a LH sponge to reduce strain on back while washing feet. Pt washed her hair which visibly brightened affect. Lateral leans for OT to complete perihygiene. Near end of shower, pt reported she needed to return to bed due to back pain, though pt was pleased she was able to sit up for duration of shower. Dressing tasks completed EOB with steady assist for balance and also bedlevel with supervision for rolling Rt>Lt. RN in to change sacral dressings before OT donned brief. Pt remained in sidelying position for pressure relief, combing her hair. Very grateful for shower. All needs within reach and 4 bedrails up.  Tx focus placed on OOB tolerance, functional transfers, ADL retraining, and balance.   Therapy Documentation  Precautions:  Precautions Precautions: Fall Precaution Comments: Strong L lateral lean, TLSO for mobility Required Braces or Orthoses: Other Brace(TLSO) Spinal Brace: Thoracolumbosacral orthotic Spinal Brace Comments: for comfort with mobility Restrictions Weight Bearing Restrictions: No Pain: Pt reported pain relief after she returned to bed, did not want pain medicine from RN when asked during session  Pain Assessment Pain Scale: 0-10 Pain Score: 0-No pain ADL:        Therapy/Group: Individual Therapy  Anaya Bovee A Orlo Brickle 11/25/2019, 12:13 PM

## 2019-11-26 ENCOUNTER — Inpatient Hospital Stay (HOSPITAL_COMMUNITY): Payer: Medicare PPO

## 2019-11-26 ENCOUNTER — Inpatient Hospital Stay (HOSPITAL_COMMUNITY): Payer: Medicare PPO | Admitting: Occupational Therapy

## 2019-11-26 LAB — GLUCOSE, CAPILLARY
Glucose-Capillary: 108 mg/dL — ABNORMAL HIGH (ref 70–99)
Glucose-Capillary: 150 mg/dL — ABNORMAL HIGH (ref 70–99)
Glucose-Capillary: 112 mg/dL — ABNORMAL HIGH (ref 70–99)
Glucose-Capillary: 164 mg/dL — ABNORMAL HIGH (ref 70–99)

## 2019-11-26 MED ORDER — CHLORHEXIDINE GLUCONATE CLOTH 2 % EX PADS
6.0000 | MEDICATED_PAD | Freq: Every day | CUTANEOUS | Status: DC
  Administered 2019-11-26 – 2019-12-05 (×10): 6 via TOPICAL

## 2019-11-26 NOTE — Plan of Care (Signed)
  Problem: Consults Goal: RH STROKE PATIENT EDUCATION Description: See Patient Education module for education specifics  Outcome: Progressing Goal: Nutrition Consult-if indicated Outcome: Progressing Goal: Diabetes Guidelines if Diabetic/Glucose > 140 Description: If diabetic or lab glucose is > 140 mg/dl - Initiate Diabetes/Hyperglycemia Guidelines & Document Interventions  Outcome: Progressing   Problem: RH BOWEL ELIMINATION Goal: RH STG MANAGE BOWEL WITH ASSISTANCE Description: STG Manage Bowel with mod I/min Assistance. Outcome: Progressing Goal: RH STG MANAGE BOWEL W/MEDICATION W/ASSISTANCE Description: STG Manage Bowel with Medication with mod I Assistance. Outcome: Progressing   Problem: RH SKIN INTEGRITY Goal: RH STG SKIN FREE OF INFECTION/BREAKDOWN Description: Patients skin will remain free from further infection or breakdown with min assist. Outcome: Progressing Goal: RH STG MAINTAIN SKIN INTEGRITY WITH ASSISTANCE Description: STG Maintain Skin Integrity With min Assistance. Outcome: Progressing   Problem: RH SAFETY Goal: RH STG ADHERE TO SAFETY PRECAUTIONS W/ASSISTANCE/DEVICE Description: STG Adhere to Safety Precautions With supervision/min Assistance/Device. Outcome: Progressing Goal: RH STG DECREASED RISK OF FALL WITH ASSISTANCE Description: STG Decreased Risk of Fall With supervision/min Assistance. Outcome: Progressing   Problem: RH PAIN MANAGEMENT Goal: RH STG PAIN MANAGED AT OR BELOW PT'S PAIN GOAL Description: < 5 Outcome: Progressing   Problem: RH KNOWLEDGE DEFICIT Goal: RH STG INCREASE KNOWLEDGE OF DIABETES Description: Patient/caregiver will verbalize understanding of DM including diet, exercise, medications, monitoring, and follow up care with min assist. Outcome: Progressing Goal: RH STG INCREASE KNOWLEDGE OF HYPERTENSION Description: Patient/caregiver will verbalize understanding of HTN including diet, exercise, medications, monitoring, and follow  up care with min assist. Outcome: Progressing Goal: RH STG INCREASE KNOWLEGDE OF HYPERLIPIDEMIA Description: Patient/caregiver will verbalize understanding of HLD including diet, exercise, medications, monitoring, and follow up care with min assist. Outcome: Progressing Goal: RH STG INCREASE KNOWLEDGE OF STROKE PROPHYLAXIS Description: Patient/caregiver will verbalize understanding of stroke prophylaxis including diet, exercise, medications, monitoring, and follow up care with min assist. Outcome: Progressing

## 2019-11-26 NOTE — Progress Notes (Signed)
Occupational Therapy Session Note  Patient Details  Name: [name] MRN: [id number] Date of Birth: Aug 30, 1951  Today's Date: 11/26/2019 OT Individual Time: [id number] Individual Time Calculation (min): 56 min   Skilled Therapeutic Interventions/Progress Updates:    Pt greeted in bed, premedicated for pain. Her goal for the session was "to walk." Started with UB/LB dressing. Supine<sit completed with supervision assist and HOB raised. She used figure 4 position to thread pants with close supervision assist for balance. Note 1 complete LOB onto Lt side of bed due to fatigue and weakness. Sit<stand with Mod A using RW. Vcs for scooting forward and hand placement prior to stand. She needed B UE support on walker to maintain balance, and therefore needed Max A to elevate pants over hips. Stand pivot<w/c completed with Min A. Pt able to unsnap buttons of her nightgown with increased time, then doffed nightgown with Min A. Overhead shirt donned with setup. Educated pt how to don her LSO, which was modified to increase comfort and support. Asked pt if she wanted OT to ask MD if they could order a better fitted brace, however pt reported having enough support with this modified brace. Pt was then escorted to dayroom via w/c. She ambulated in circular path that was seeded with chairs, Mod A for sit<stands and Min A for ambulation using RW, obtaining distances of 27 ft and 60 ft without rest. Vcs for widening BOS at times and also for forward gaze. Vcs for RW placement during stand<sit transitions as well. Returned to room and pt completed oral care, opted to sit vs stand to conserve energy. Supervision assist for task. Pt wanted to keep working on leg strength. She propelled w/c with B LEs down hallway, increased time and exertion noted. She stated propelling w/c backwards helped to loosen up her hips. Stand pivot<bed completed with Min A using RW. Guided pt through bedlevel LE exercises, including  clamshells, straight leg raises, and ankle pumps x10-20 reps. Encouraged pt to complete these exercises as much as possible outside of therapy for strength building. Pt remained in bed in sidelying position for pressure relief and pain relief. 4 bedrails up. Tx focus placed on ADL retrianing, dynamic standing balance, NMR, strengthening, and activity tolerance.    Therapy Documentation Precautions:  Precautions Precautions: Fall Precaution Comments: Strong L lateral lean, TLSO for mobility Required Braces or Orthoses: Other Brace(TLSO) Spinal Brace: Thoracolumbosacral orthotic Spinal Brace Comments: for comfort with mobility Restrictions Weight Bearing Restrictions: No ADL:       Therapy/Group: Individual Therapy  Rebakah Cokley A Mahdiya Mossberg 11/26/2019, 12:10 PM

## 2019-11-26 NOTE — Progress Notes (Signed)
[place] PHYSICAL MEDICINE & REHABILITATION PROGRESS NOTE   Subjective/Complaints:   Pt up in manual w/c- in therapy today- got shower yesterday.   ROS: Denies CP, SOB, N/V/D  Objective:   No results found. Recent Labs    11/24/19 1230  WBC 10.0  HGB 8.5*  HCT 28.4*  PLT 255   Recent Labs    11/24/19 1231  NA 139  K 3.6  CL 99  CO2 28  GLUCOSE 148*  BUN 24*  CREATININE 5.98*  CALCIUM 8.5*    Intake/Output Summary (Last 24 hours) at 11/26/2019 1112 Last data filed at 11/26/2019 0800 Gross per 24 hour  Intake 580 ml  Output -  Net 580 ml     Physical Exam: Vital Signs Blood pressure 127/84, pulse 83, temperature 97.9 F (36.6 C), temperature source Oral, resp. rate 18, height 5\' 7"  (1.702 m), weight 55.8 kg, SpO2 100 %.  Constitutional: No distress . Vital signs reviewed. HENT: Normocephalic.  Atraumatic. Eyes: EOMI. No discharge. Cardiovascular: No JVD. Respiratory: Normal effort.  No stridor. GI: Non-distended. Skin: Sacbright affect Musc: + TTP left greater trochanteric area. Neuro: Alert and oriented x3 Motor:  RUE: 5/5 proximal to distal RLE: HF, KE 4+/5, ADF 4+/5 (pain inhibition) LUE: 4+/5 proximal to distal LLE: HF, KE 4+/5, ADF 4+/5 (pain inhibition)  Assessment/Plan: 1. Functional deficits secondary to right basal ganglia stroke which require 3+ hours per day of interdisciplinary therapy in a comprehensive inpatient rehab setting.  Physiatrist is providing close team supervision and 24 hour management of active medical problems listed below.  Physiatrist and rehab team continue to assess barriers to discharge/monitor patient progress toward functional and medical goals  Care Tool:  Bathing    Body parts bathed by patient: Right arm, Left arm, Chest, Abdomen, Front perineal area, Right upper leg, Left upper leg, Face, Right lower leg, Left lower leg   Body parts bathed by helper: Buttocks     Bathing assist Assist Level: Minimal  Assistance - Patient > 75%     Upper Body Dressing/Undressing Upper body dressing   What is the patient wearing?: Pull over shirt    Upper body assist Assist Level: Set up assist    Lower Body Dressing/Undressing Lower body dressing      What is the patient wearing?: Pants     Lower body assist Assist for lower body dressing: Minimal Assistance - Patient > 75%     Toileting Toileting    Toileting assist Assist for toileting: Maximal Assistance - Patient 25 - 49%     Transfers Chair/bed transfer  Transfers assist     Chair/bed transfer assist level: Moderate Assistance - Patient 50 - 74%     Locomotion Ambulation   Ambulation assist   Ambulation activity did not occur: Safety/medical concerns(back pain, bilateral LE weakness, poor bilateral knee control)  Assist level: 2 helpers Assistive device: Walker-rolling Max distance: 51ft   Walk 10 feet activity   Assist  Walk 10 feet activity did not occur: Safety/medical concerns(back pain, bilateral LE weakness, poor bilateral knee control)  Assist level: 2 helpers Assistive device: Walker-rolling   Walk 50 feet activity   Assist Walk 50 feet with 2 turns activity did not occur: Safety/medical concerns(back pain, bilateral LE weakness, poor bilateral knee control)  Assist level: 2 helpers Assistive device: Walker-rolling    Walk 150 feet activity   Assist Walk 150 feet activity did not occur: Safety/medical concerns(back pain, bilateral LE weakness, poor bilateral knee control)  Walk 10 feet on uneven surface  activity   Assist Walk 10 feet on uneven surfaces activity did not occur: Safety/medical concerns(back pain, bilateral LE weakness, poor bilateral knee control)         Wheelchair     Assist Will patient use wheelchair at discharge?: Yes Type of Wheelchair: Manual Wheelchair activity did not occur: Safety/medical concerns(Pt could not tolerate sitting in WC for long  enough to complete activity due to low back pain)  Wheelchair assist level: Minimal Assistance - Patient > 75% Max wheelchair distance: 22ft    Wheelchair 50 feet with 2 turns activity    Assist    Wheelchair 50 feet with 2 turns activity did not occur: Safety/medical concerns(Pt could not tolerate sitting in [facility] for long enough to complete activity due to low back pain)       Wheelchair 150 feet activity     Assist  Wheelchair 150 feet activity did not occur: Safety/medical concerns(Pt could not tolerate sitting in [facility] for long enough to complete activity due to low back pain)       Blood pressure 127/84, pulse 83, temperature 97.9 F (36.6 C), temperature source Oral, resp. rate 18, height 5\' 7"  (1.702 m), weight 55.8 kg, SpO2 100 %.    Medical Problem List and Plan: 1.  Impaired gait with left side weakness secondary to right basal ganglia infarction as well as small cortical infarct left frontal lobe  CT head on 12/13 personally reviewed, showing some improvement, MRI personally reviewed, stable. Repeat MRI ordered again by ID, showing stable infarcts  Chest x-ray personally reviewed, unremarkable for infection  Discussed with neurology results of the EEG -findings possibly related to uremia +/- cefepime  Discussed with psychiatry, continue medical management  Discussed with hospitalist, appreciate recs, discussed with hospitalist again on 12/25  Continue CIR 2.  Antithrombotics: -DVT/anticoagulation: Chronic Coumadin for right IJ acute DVT.  Plan anticoagulation x3 months  Transitioned to Eliquis as it has been very difficult to manage INR    Repeat U/S suggesting chronic DVT             -antiplatelet therapy: N/A 3. Pain Management chronic back and left hip pain: Neurontin 100 mg on hold  TLSO back brace for comfort  Lidocaine patch added to coccyx  Voltaren gel added for left greater trochanteric area on 1/1 4. Mood: Provide emotional support              -antipsychotic agents: N/A 5. Neuropsych: This patient is not capable of making decisions on her own behalf. 6. Skin/Wound Care: Routine skin checks  Santyl to sacral wound - WOC notes reviewed 7. Fluids/Electrolytes/Nutrition: Routine in and outs 8.  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  New HD tunneled catheter placed 10/26/2019.  Repeat echo showing increase in mitral regurg  CT surgery recommending mitral valve replacement- will follow as outpatient. 9.  ID/discitis/recurrent Pseudomonas bacteremia.    Cipro/Maxipime changed to ceftazidime/Cipro through 12/06/2019.  Seen by neurosurgery on 12/25, no surgical intervention recommended at this point, however recommended following ID recs  Appreciate ID recs  10.  Hemodialysis MWF.  Follow-up per renal services.  11.  Diabetes mellitus.  Hemoglobin A1c 7.6.  SSI. CBG (last 3)  Recent Labs    11/25/19 1645 11/25/19 2057 11/26/19 0637  GLUCAP 148* 142* 108*   Controlled 1/3 12.  Hypertension.  Imdur 30 mg daily  Coreg 6.25 mg twice daily, increased to 12.5 on  12/15, parameters placed.    Monitor with increased mobility.  Relatively controlled on 1/3 13.  Acute on chronic anemia.    Hemoglobin 8.2 on 12/30  Continue to monitor 14. Constipation:  See #16  Abd xray showing?  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  Candidiasis  Diflucan ordered on 12/16  Repeat Ucx not collected yet on 1/1 16.  Hyperammonemia with hepatic encephalopathy  Lactulose started on 12/17, encourage compliance  Ammonia WNL on 12/22 17. Sleep disturance  Melatonin added on 12/17  Sleep chart ordered  Improving overall 18.  Leukocytosis  WBCs 13.3 on 12/30, labs with HD  1/1- WBC down to 10k  Afebrile  Continue to monitor 19.  GERD  Home Protonix ordered on 12/20 20.  Pneumatosis Coli  CT abdomen/pelvis on 12/24?  Obese.  Appreciate  surgery recs-recommending GI recs, no surgical intervention, signed off  Appreciate GI recs, advising against colonoscopy and recommending conservative care at present with repeat imaging in 1-3 months.  LOS: 25 days A FACE TO FACE EVALUATION WAS PERFORMED  Jetty Berland 11/26/2019, 11:12 AM

## 2019-11-26 NOTE — Progress Notes (Addendum)
Physical Therapy Session Note  Patient Details  Name: [name] MRN: [id number] Date of Birth: 01-16-1951  Today's Date: 11/26/2019 PT Individual Time: [id number] PT Individual Time Calculation (min): 55 min   Short Term Goals: Week 4:  PT Short Term Goal 1 (Week 4): Pt will transfer supine<>sit and sit<>supine with CGA PT Short Term Goal 2 (Week 4): Pt will transfer stand<>pivot with LRAD mod A PT Short Term Goal 3 (Week 4): Pt will perform car transfer with LRAD mod A  Skilled Therapeutic Interventions/Progress Updates:     Patient in bed wit her son at bedside upon PT arrival. Patient alert and agreeable to PT session. Patient reported 4-5/10 low back pain during session, RN made aware. PT provided repositioning, rest breaks, and distraction as pain interventions throughout session. TLSO donned with total A in sitting EOB and throughout all OOB mobility for comfort and pain management during session. Patient's son donned B tennis shoes for patient with total A.   Therapeutic Activity: Bed Mobility: Patient performed supine to/from sit with supervision and increased time to complete task, completed supine to sit without use of bed rail. Provided verbal cues for rolling to side-lying, setting lower elbow and using B UEs to push to sitting. Transfers: Patient performed sit to/from stand x4 and stand pivot x1 with min A using a RW. Provided verbal cues for scooting forward, foot placement, leaning forward, hand placement on RW, and reaching back to control descent. Patient sat in the w/c and brushed her hair with set-up assist to perform self care prior to leaving the room.   Gait Training:  Patient ambulated 66 feet, 75 feet, and 35 feet using RW with min A-CGA. Ambulated with narrow BOS, forward trunk lean, decreased step length and height, decreased gait speed, decreased foot clearance L>R, and downwared head gaze. Provided verbal cues for erect posture and increased BOS and step  height. Patient's L knee buckled during last trail of ambulation due to fatigue and required max A to recover her balance and mod A to pivot to sitting on the bed after. Patient went up/down 4-6" steps with mod-min A using B rails. Provided cues for sequencing: ascending leading with the R and descending leading with the L using a step-to gait pattern for safety.  Wheelchair Mobility:  Patient propelled wheelchair 45 feet with supervision using B UE and LEs. Provided verbal cues for turning technique.  Patient sitting EOB with her son in the room at end of session with breaks locked, bed alarm set, and all needs within reach. Discussed d/c planning and patient's PT goals, patient expressed that she would like to leave sooner than Jan. 13 if possible. PT discussed patient's progress and will pass on progress to lead therapist. Patient requested to sit EOB rather than lie down in the bed. Reported that w/c and recliner were uncomfortable, but that she would like to sit up. PT and RN in agreement that patient is safe to sit EOB with her son in the room. Safety sheet updated and patient and her son educated on have the patient lie down with 4 rails up when her son leaves and let nursing staff know when he is leaving so they can ensure patient is safe in the bed. Patient and her son in agreement.    Therapy Documentation Precautions:  Precautions Precautions: Fall Precaution Comments: Strong L lateral lean, TLSO for mobility Required Braces or Orthoses: Other Brace(TLSO) Spinal Brace: Thoracolumbosacral orthotic Spinal Brace Comments: for comfort with mobility Restrictions  Weight Bearing Restrictions: No General: PT Amount of Missed Time (min): 5 Minutes PT Missed Treatment Reason: Patient fatigue    Therapy/Group: Individual Therapy  Emberleigh Reily L Ailea Rhatigan PT, DPT  11/26/2019, 4:12 PM

## 2019-11-26 NOTE — Progress Notes (Addendum)
[facility] Progress Note    Assessment/ Plan:   1. Acute CVA with evidence of chronic ischemia/old infarcts:admit Oct 20, 2019, L sided weakness. Seen by neurology-suspected embolic infarct from endocarditis. Ongoing inpatient rehabilitation for management of neurological deficits. Repeat MRI showedno acute changes 2. Pseudomonas MV endocarditis/ bacteremia: HD cath exchanged for infection focus control. Seen by TCTS w/ no plan for surgical intervention. Completed 26 days cefepime. Antibiotics changed to [facility] per ID 12/25.  3. Progression L3-4 discitis on MRI. Evaluated by neurosurgery. No indication for surgical intervention. ID has directed ABX and duration. Antibiotics changed to Fortaz/Cipro.  4. R IJ acute DVT: on 3 mos Eliquis (change from coumadin due to difficulties managing INR) 5. ESRD: MWF HD. ESRD x 1 year, tried AVF "6 times" w/o success. TDC dependent. Next HD Monday. 3K bath here. Tight heparin.  6. Anemia:hgb 8s. ESA qMon. tsat 16% 12/15 - Fe held due to being treated for infection 7. 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  8. Hypertension/volume : BP low/stable. No vol^ on exam.  9.Nutrition -diet liberalized to regular - has supplements/multivits ordered - alb very low - not eating much - contributing to weakness 10. Deconditioning -in CIR   Kelly Splinter, MD 11/26/2019, 9:29 AM    Subjective:    Seen in room, no c/o today. No SOB, CP or abd pain.  Objective:   BP 127/84 (BP Location: Left Arm)   Pulse 83   Temp 97.9 F (36.6 C) (Oral)   Resp 18   Ht 5\' 7"  (1.702 m)   Wt 55.8 kg   SpO2 100%   BMI 19.27 kg/m   Intake/Output Summary (Last 24 hours) at 11/26/2019 [id number] Last data filed at 11/25/2019 2250 Gross per 24 hour  Intake 460 ml  Output --  Net 460 ml   Weight change: -0.2 kg  HD Outpatient: [facility] MWF  3.5h  TDC (mult AVF attempts failed)  62.5kg  2/2.5 bath 400/600 - hect 1.5  ug - EPO 1200u each HD, iron 50mg  weekly  Physical Exam: Gen: NAD, on L side on HD CVS: RRR Resp: clear bilaterally Abd: soft nontender NABS Ext: no LE edema  Imaging: No results found.  Labs: BMET Recent Labs  Lab 11/20/19 1314 11/24/19 1231  NA 136 139  K 4.0 3.6  CL 99 99  CO2 24 28  GLUCOSE 102* 148*  BUN 28* 24*  CREATININE 6.32* 5.98*  CALCIUM 8.1* 8.5*  PHOS 3.6 3.3   CBC Recent Labs  Lab 11/19/19 1737 11/20/19 1100 11/22/19 1407 11/24/19 1230  WBC 10.9* 11.1* 13.3* 10.0  NEUTROABS  --   --   --  6.9  HGB 8.1* 8.6* 8.2* 8.5*  HCT 23.1* 28.3* 26.6* 28.4*  MCV 89.5 92.8 91.7 94.0  PLT 543* 247 231 255    Medications:    . acetaminophen  500 mg Oral TID  . apixaban  5 mg Oral BID  . calcium carbonate  1 tablet Oral 2 times per day  . carvedilol  12.5 mg Oral BID  . Chlorhexidine Gluconate Cloth  6 each Topical Q0600  . ciprofloxacin  500 mg Oral Q24H  . collagenase   Topical Daily  . darbepoetin (ARANESP) injection - DIALYSIS  100 mcg Intravenous Q Mon-HD  . diclofenac Sodium  2 g Topical QID  . doxercalciferol  1.5 mcg Intravenous Q M,W,F-HD  . feeding supplement (ENSURE ENLIVE)  237 mL Oral BID BM  .  insulin aspart  0-9 Units Subcutaneous TID WC  . isosorbide mononitrate  30 mg Oral QPC lunch  . lactulose  20 g Oral TID  . lidocaine  1 patch Transdermal Q24H  . Melatonin  3 mg Oral QHS  . multivitamin  1 tablet Oral QPC lunch  . Muscle Rub   Topical BID  . pantoprazole  40 mg Oral Daily  . saccharomyces boulardii  250 mg Oral BID

## 2019-11-27 ENCOUNTER — Inpatient Hospital Stay (HOSPITAL_COMMUNITY): Payer: Medicare PPO | Admitting: Speech Pathology

## 2019-11-27 ENCOUNTER — Inpatient Hospital Stay (HOSPITAL_COMMUNITY): Payer: Medicare PPO | Admitting: Occupational Therapy

## 2019-11-27 ENCOUNTER — Inpatient Hospital Stay (HOSPITAL_COMMUNITY): Payer: Medicare PPO

## 2019-11-27 ENCOUNTER — Inpatient Hospital Stay (HOSPITAL_COMMUNITY): Payer: Medicare PPO | Admitting: Physical Therapy

## 2019-11-27 DIAGNOSIS — I8290 Acute embolism and thrombosis of unspecified vein: Secondary | ICD-10-CM

## 2019-11-27 LAB — RENAL FUNCTION PANEL
Albumin: 1.9 g/dL — ABNORMAL LOW (ref 3.5–5.0)
Anion gap: 13 (ref 5–15)
BUN: 40 mg/dL — ABNORMAL HIGH (ref 8–23)
CO2: 29 mmol/L (ref 22–32)
Calcium: 8.6 mg/dL — ABNORMAL LOW (ref 8.9–10.3)
Chloride: 98 mmol/L (ref 98–111)
Creatinine, Ser: 8.15 mg/dL — ABNORMAL HIGH (ref 0.44–1.00)
GFR calc Af Amer: 5 mL/min — ABNORMAL LOW (ref >60)
GFR calc non Af Amer: 5 mL/min — ABNORMAL LOW (ref >60)
Glucose, Bld: 178 mg/dL — ABNORMAL HIGH (ref 70–99)
Phosphorus: 3.3 mg/dL (ref 2.5–4.6)
Potassium: 3.8 mmol/L (ref 3.5–5.1)
Sodium: 140 mmol/L (ref 135–145)

## 2019-11-27 LAB — CBC
HCT: 30.3 % — ABNORMAL LOW (ref 36.0–46.0)
HCT: 29.2 % — ABNORMAL LOW (ref 36.0–46.0)
Hemoglobin: 9.2 g/dL — ABNORMAL LOW (ref 12.0–15.0)
Hemoglobin: 8.9 g/dL — ABNORMAL LOW (ref 12.0–15.0)
MCH: 28.5 pg (ref 26.0–34.0)
MCH: 28.2 pg (ref 26.0–34.0)
MCHC: 30.4 g/dL (ref 30.0–36.0)
MCHC: 30.5 g/dL (ref 30.0–36.0)
MCV: 93.8 fL (ref 80.0–100.0)
MCV: 92.4 fL (ref 80.0–100.0)
Platelets: 266 10*3/uL (ref 150–400)
Platelets: 254 10*3/uL (ref 150–400)
RBC: 3.23 MIL/uL — ABNORMAL LOW (ref 3.87–5.11)
RBC: 3.16 MIL/uL — ABNORMAL LOW (ref 3.87–5.11)
RDW: 19.2 % — ABNORMAL HIGH (ref 11.5–15.5)
RDW: 19.1 % — ABNORMAL HIGH (ref 11.5–15.5)
WBC: 8.2 10*3/uL (ref 4.0–10.5)
WBC: 7.8 10*3/uL (ref 4.0–10.5)
nRBC: 0 % (ref 0.0–0.2)
nRBC: 0 % (ref 0.0–0.2)

## 2019-11-27 LAB — GLUCOSE, CAPILLARY
Glucose-Capillary: 173 mg/dL — ABNORMAL HIGH (ref 70–99)
Glucose-Capillary: 66 mg/dL — ABNORMAL LOW (ref 70–99)
Glucose-Capillary: 68 mg/dL — ABNORMAL LOW (ref 70–99)
Glucose-Capillary: 108 mg/dL — ABNORMAL HIGH (ref 70–99)
Glucose-Capillary: 125 mg/dL — ABNORMAL HIGH (ref 70–99)

## 2019-11-27 LAB — HEPATITIS B SURFACE ANTIGEN: Hepatitis B Surface Ag: NONREACTIVE

## 2019-11-27 MED ORDER — HEPARIN SODIUM (PORCINE) 1000 UNIT/ML IJ SOLN
INTRAMUSCULAR | Status: AC
  Administered 2019-11-27: 1100 [IU] via INTRAVENOUS_CENTRAL
  Filled 2019-11-27: qty 4

## 2019-11-27 MED ORDER — DARBEPOETIN ALFA 100 MCG/0.5ML IJ SOSY
PREFILLED_SYRINGE | INTRAMUSCULAR | Status: AC
  Administered 2019-11-27: 100 ug via INTRAVENOUS
  Filled 2019-11-27: qty 0.5

## 2019-11-27 MED ORDER — DOXERCALCIFEROL 4 MCG/2ML IV SOLN
INTRAVENOUS | Status: AC
  Administered 2019-11-27: 1.5 ug via INTRAVENOUS
  Filled 2019-11-27: qty 2

## 2019-11-27 NOTE — Progress Notes (Signed)
Speech Language Pathology Daily Session Note  Patient Details  Name: Lorisa Naqvi MRN: [id number] Date of Birth: 1951/06/06  Today's Date: 11/27/2019 SLP Individual Time: 1115-1210 SLP Individual Time Calculation (min): 55 min  Short Term Goals: Week 4: SLP Short Term Goal 1 (Week 4): Pt will demonstrate functional problem solving during basic tasks with Min A verbal/visual cues SLP Short Term Goal 2 (Week 4): Pt will demonstrate recall of day to day/new information with Min A verbal cues for use of compensatory memory strategies SLP Short Term Goal 3 (Week 4): Pt will detect and correct errors during functional tasks with Min A verbal/visual cues. SLP Short Term Goal 4 (Week 4): Pt will selectively attend to functional tasks with Min A verbal cues for redirection SLP Short Term Goal 5 (Week 4): Pt will demonstrate intellectual awareness by identifying 1 physical and 1 cognitive deficit with Min A verbal cues.  Skilled Therapeutic Interventions:  Skilled treatment session focused on  Cognition goals. SLP recieved pt in bed on the phone with bank. SLP facilitated session by providing Min A cues to solve basic money tasks. However, pt was able to recall her current bills, amount and when they would be due. She also expressed concerns over mounting hospital bills. Pt able to alternate attention between therapy tasks and need to answer her phone to handle some business with her son. She was also oriented x 4, able to recall current d/c date and recall activities performed within PT/OT sessions earlier in the day. Pt left in bed with all needs within reach. Continue per current plan of care.      Pain    Therapy/Group: Individual Therapy  Cadell Gabrielson 11/27/2019, 12:18 PM

## 2019-11-27 NOTE — Progress Notes (Signed)
Hypoglycemic Event  CBG: 68 @ 1152  Treatment: 4oz of grape juice  Symptoms: no symptoms noted.  Follow-up CBG: Time:1405 CBG Result:108 Possible Reasons for Event: Patient ate lunch, no complications noted at this time. Continue plan of care.   Comments/MD notified: [name], [place] notified.    Audie Clear

## 2019-11-27 NOTE — Progress Notes (Signed)
Occupational Therapy Weekly Progress Note  Patient Details  Name: [name] MRN: [id number] Date of Birth: [birth year]  Beginning of progress report period: November 18, 2019 End of progress report period: November 27, 2019  Today's Date: 11/27/2019 OT Individual Time: 0732-0827 OT Individual Time Calculation (min): 55 min    Patient has met 3 of 4 short term goals.  Pt has made great progress towards goals over the last reporting period. Over the last few days, pt has demonstrated improved motivation and participation in therapy sessions. Pt is able to complete squat pivot transfers and sit >stand at Mod assist level and ambulation with RW with Min assist once upright. Pt continues to be limited by back and Lt hip pain. Have modified goals again to reflect increased participation and abilities.    Patient continues to demonstrate the following deficits: muscle weakness,decreased cardiorespiratoy enduranceand decreased sitting balance, decreased standing balance and decreased balance strategies and therefore will continue to benefit from skilled OT intervention to enhance overall performance with BADL and Reduce care partner burden.  Patient progressing toward long term goals..  Plan of care revisions: upgraded to min assist for dynamic standing balance, transfers, and self-care tasks.  OT Short Term Goals Week 3:  OT Short Term Goal 1 (Week 3): Pt will perform shower transfer with mod A. OT Short Term Goal 1 - Progress (Week 3): Met OT Short Term Goal 2 (Week 3): Pt will perform toileting with mod A. OT Short Term Goal 2 - Progress (Week 3): Met OT Short Term Goal 3 (Week 3): Pt will complete LB dressing with mod assist OT Short Term Goal 3 - Progress (Week 3): Met OT Short Term Goal 4 (Week 3): Pt will tolerate sitting OOB 2 hrs to demonstrate increased activity tolerance OT Short Term Goal 4 - Progress (Week 3): Progressing toward goal Week 4:  OT Short Term Goal 1 (Week 4): Pt  will tolerate sitting OOB 2 hrs to demonstrate increased activity tolerance OT Short Term Goal 2 (Week 4): Pt will compete bathing with mod assist OT Short Term Goal 3 (Week 4): Pt will complete LB dressing at sit > stand level with min assist OT Short Term Goal 4 (Week 4): Pt will don socks and shoes with min assist OT Short Term Goal 5 (Week 4): Pt will complete toilet transfer with min assist  Skilled Therapeutic Interventions/Progress Updates:    Treatment session with focus on functional mobility and dynamic sitting and standing balance.  Pt received in bed expressing desire to focus on walking.  Completed bed mobility with Supervision.  Mod assist squat pivot transfer bed > w/c.  Donned TLSO with total assist.  Pt completed grooming tasks seated at sink with min cues and setup for items.  Pt was able to doff hospital socks in figure 4 position and donned Rt sock, but required assistance to don Lt sock and B shoes.  Pt propelled w/c ~80 for BUE strengthening.  Engaged in sit > stand with mod assist with cues for hand placement to facilitate weight shift.  Engaged in alternating toe taps while in standing to challenge standing balance and weight shifting, pt with c/o pain in hip and terminating task despite education on purpose of task. Ambulated 40', 23', and 40' with Min assist with RW once upright (still requiring mod assist for sit > stand.   Returned to room and set up for breakfast.  Pt quickly reporting desire to return to bed due to discomfort in sitting.  Mod assist squat pivot transfer back to bed.  Pt remained semi-reclined with RN present to administer morning meds.  Therapy Documentation Precautions:  Precautions Precautions: Fall Precaution Comments: Strong L lateral lean, TLSO for mobility Required Braces or Orthoses: Other Brace(TLSO) Spinal Brace: Thoracolumbosacral orthotic Spinal Brace Comments: for comfort with mobility Restrictions Weight Bearing Restrictions: No Pain: Pt  with c/o pain in Lt hip.  RN notified.  Therapy/Group: Individual Therapy  [name] 11/27/2019, 8:33 AM

## 2019-11-27 NOTE — Progress Notes (Signed)
[facility] Progress Note    Assessment/ Plan:   1. Acute CVA with evidence of chronic ischemia/old infarcts:admit Oct 20, 2019, L sided weakness. Seen by neurology-suspected embolic infarct from endocarditis. Ongoing inpatient rehabilitation for management of neurological deficits. Repeat MRI showedno acute changes 2. Pseudomonas MV endocarditis/ bacteremia: HD cath exchanged for infection focus control. Seen by TCTS w/ no plan for surgical intervention. Completed 26 days cefepime. Antibiotics changed to [facility] per ID 12/25.  3. Progression L3-4 discitis on MRI. Evaluated by neurosurgery. No indication for surgical intervention. ID has directed ABX and duration. Antibiotics changed to Fortaz/Cipro.  4. R IJ acute DVT: on 3 mos Eliquis (change from coumadin due to difficulties managing INR) 5. ESRD: MWF HD. ESRD x 1 year, tried AVF "6 times" w/o success. TDC dependent. Next HD today.  Generally 1L UF during treatments. 3K bath here. Tight heparin.  6. Anemia:hgb improved to 9s. ESA qMon. tsat 16% 12/15 - Fe held due to being treated for infection 7. 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  8. Hypertension/volume : BP low/stable. No vol^ on exam.  9.Nutrition -diet liberalized to regular - has supplements/multivits ordered - alb very low - not eating much - contributing to weakness 10. Deconditioning -in CIR      Subjective:    Seen in room, having nausea and emesis which she thinks is due to taking meds on empty stomach. No SOB, CP or abd pain.  No fevers, chills.  Objective:   BP 106/72 (BP Location: Left Arm)   Pulse 88   Temp 98.5 F (36.9 C) (Oral)   Resp 18   Ht 5\' 7"  (1.702 m)   Wt 55.8 kg   SpO2 100%   BMI 19.27 kg/m   Intake/Output Summary (Last 24 hours) at 11/27/2019 1313 Last data filed at 11/27/2019 0810 Gross per 24 hour  Intake 490 ml  Output --  Net 490 ml   Weight change:   HD Outpatient:  [facility] MWF  3.5h  TDC (mult AVF attempts failed)  62.5kg  2/2.5 bath 400/600 - hect 1.5 ug - EPO 1200u each HD, iron 50mg  weekly  Physical Exam: Gen: NAD, on L side in bed vomiting. CVS: RRR Resp: clear bilaterally Abd: soft nontender NABS Ext: no LE edema  Imaging: No results found.  Labs: BMET Recent Labs  Lab 11/20/19 1314 11/24/19 1231  NA 136 139  K 4.0 3.6  CL 99 99  CO2 24 28  GLUCOSE 102* 148*  BUN 28* 24*  CREATININE 6.32* 5.98*  CALCIUM 8.1* 8.5*  PHOS 3.6 3.3   CBC Recent Labs  Lab 11/22/19 1407 11/24/19 1230 11/27/19 0529  WBC 13.3* 10.0 8.2  NEUTROABS  --  6.9  --   HGB 8.2* 8.5* 9.2*  HCT 26.6* 28.4* 30.3*  MCV 91.7 94.0 93.8  PLT 231 255 266    Medications:    . acetaminophen  500 mg Oral TID  . apixaban  5 mg Oral BID  . calcium carbonate  1 tablet Oral 2 times per day  . carvedilol  12.5 mg Oral BID  . Chlorhexidine Gluconate Cloth  6 each Topical Q0600  . Chlorhexidine Gluconate Cloth  6 each Topical Q0600  . ciprofloxacin  500 mg Oral Q24H  . collagenase   Topical Daily  . darbepoetin (ARANESP) injection - DIALYSIS  100 mcg Intravenous Q Mon-HD  . diclofenac Sodium  2 g Topical QID  . doxercalciferol  1.5 mcg Intravenous Q M,W,F-HD  . feeding supplement (ENSURE ENLIVE)  237 mL Oral BID BM  . insulin aspart  0-9 Units Subcutaneous TID WC  . isosorbide mononitrate  30 mg Oral QPC lunch  . lactulose  20 g Oral TID  . lidocaine  1 patch Transdermal Q24H  . Melatonin  3 mg Oral QHS  . multivitamin  1 tablet Oral QPC lunch  . Muscle Rub   Topical BID  . pantoprazole  40 mg Oral Daily  . saccharomyces boulardii  250 mg Oral BID

## 2019-11-27 NOTE — Consult Note (Signed)
[facility] Nurse wound follow up Patient receiving care in [facility] 4W12. Wound type: unstageable to coccyx Measurement: 1.9 cm x 2 cm Wound bed: yellow Drainage (amount, consistency, odor) brown on existing foam dressing Periwound: intact Dressing procedure/placement/frequency: continue daily santyl and saline moistened gauze. Monitor the wound area(s) for worsening of condition such as: Signs/symptoms of infection,  Increase in size,  Development of or worsening of odor, Development of pain, or increased pain at the affected locations.  Notify the medical team if any of these develop.  Thank you for the consult.  Discussed plan of care with the patient and bedside nurse.  Val Riles, RN, MSN, CWOCN, CNS-BC, pager [phone]

## 2019-11-27 NOTE — Plan of Care (Signed)
Problem: RH Balance Goal: LTG Patient will maintain dynamic sitting balance (PT) Description: LTG:  Patient will maintain dynamic sitting balance with assistance during mobility activities (PT) Flowsheets (Taken 11/27/2019 0744) LTG: Pt will maintain dynamic sitting balance during mobility activities with:: (upgraded due to improved balance) Supervision/Verbal cueing Note: upgraded due to improved balance Goal: LTG Patient will maintain dynamic standing balance (PT) Description: LTG:  Patient will maintain dynamic standing balance with assistance during mobility activities (PT) Flowsheets (Taken 11/27/2019 0744) LTG: Pt will maintain dynamic standing balance during mobility activities with:: (upgraded due to improved balance and LE strength) Supervision/Verbal cueing Note: upgraded due to improved balance and LE strength   Problem: Sit to Stand Goal: LTG:  Patient will perform sit to stand with assistance level (PT) Description: LTG:  Patient will perform sit to stand with assistance level (PT) Flowsheets (Taken 11/27/2019 0744) LTG: PT will perform sit to stand in preparation for functional mobility with assistance level: (upgraded due to improved balance and LE strength) Supervision/Verbal cueing Note: upgraded due to improved balance and LE strength   Problem: RH Bed Mobility Goal: LTG Patient will perform bed mobility with assist (PT) Description: LTG: Patient will perform bed mobility with assistance, with/without cues (PT). Flowsheets (Taken 11/27/2019 0744) LTG: Pt will perform bed mobility with assistance level of: (upgraded due to improved balance) Supervision/Verbal cueing Note: upgraded due to improved balance    Problem: RH Bed to Chair Transfers Goal: LTG Patient will perform bed/chair transfers w/assist (PT) Description: LTG: Patient will perform bed to chair transfers with assistance (PT). Flowsheets (Taken 11/27/2019 0744) LTG: Pt will perform Bed to Chair Transfers with  assistance level: (upgraded due to improved balance and LE strength) Supervision/Verbal cueing Note: upgraded due to improved balance and LE strength   Problem: RH Car Transfers Goal: LTG Patient will perform car transfers with assist (PT) Description: LTG: Patient will perform car transfers with assistance (PT). Flowsheets (Taken 11/27/2019 0744) LTG: Pt will perform car transfers with assist:: (upgraded due to improved balance and LE strength) Supervision/Verbal cueing Note: upgraded due to improved balance and LE strength   Problem: RH Ambulation Goal: LTG Patient will ambulate in controlled environment (PT) Description: LTG: Patient will ambulate in a controlled environment, # of feet with assistance (PT). Flowsheets (Taken 11/27/2019 0744) LTG: Pt will ambulate in controlled environ  assist needed:: (upgraded due to improved balance and LE strength) Supervision/Verbal cueing LTG: Ambulation distance in controlled environment: 57ft with LRAD Note: upgraded due to improved balance and LE strength Goal: LTG Patient will ambulate in home environment (PT) Description: LTG: Patient will ambulate in home environment, # of feet with assistance (PT). Flowsheets (Taken 11/27/2019 0744) LTG: Pt will ambulate in home environ  assist needed:: (upgraded due to improved balance and LE strength) Supervision/Verbal cueing LTG: Ambulation distance in home environment: 83ft with LRAD Note: upgraded due to improved balance and LE strength   Problem: RH Wheelchair Mobility Goal: LTG Patient will propel w/c in controlled environment (PT) Description: LTG: Patient will propel wheelchair in controlled environment, # of feet with assist (PT) Flowsheets (Taken 11/27/2019 0744) LTG: Pt will propel w/c in controlled environ  assist needed:: (upgraded due to improved activity tolerance) Supervision/Verbal cueing LTG: Propel w/c distance in controlled environment: 22ft Note: upgraded due to improved activity  tolerance Goal: LTG Patient will propel w/c in home environment (PT) Description: LTG: Patient will propel wheelchair in home environment, # of feet with assistance (PT). Flowsheets (Taken 11/27/2019 0744) LTG: Pt will propel w/c  in home environ  assist needed:: (upgraded due to improved activity tolerance) Supervision/Verbal cueing LTG: Propel w/c distance in home environment: 73ft Note: upgraded due to improved activity tolerance   Problem: RH Stairs Goal: LTG Patient will ambulate up and down stairs w/assist (PT) Description: LTG: Patient will ambulate up and down # of stairs with assistance (PT) Flowsheets (Taken 11/27/2019 0744) LTG: Pt will ambulate up/down stairs assist needed:: (upgraded due to improved balance and LE strength) Contact Guard/Touching assist LTG: Pt will  ambulate up and down number of stairs: 3 stairs with 2 rails Note: upgraded due to improved balance and LE strength

## 2019-11-27 NOTE — Plan of Care (Signed)
  Problem: RH Balance Goal: LTG Patient will maintain dynamic standing with ADLs (OT) Description: LTG:  Patient will maintain dynamic standing balance with assist during activities of daily living (OT)  Flowsheets (Taken 11/27/2019 0829) LTG: Pt will maintain dynamic standing balance during ADLs with: (upgraded) Minimal Assistance - Patient > 75% Note: Upgraded due to progress towards goals   Problem: Sit to Stand Goal: LTG:  Patient will perform sit to stand in prep for activites of daily living with assistance level (OT) Description: LTG:  Patient will perform sit to stand in prep for activites of daily living with assistance level (OT) Flowsheets (Taken 11/27/2019 0829) LTG: PT will perform sit to stand in prep for activites of daily living with assistance level: (Upgraded due to progress towards goals) Minimal Assistance - Patient > 75% Note: Upgraded due to progress towards goals   Problem: RH Dressing Goal: LTG Patient will perform upper body dressing (OT) Description: LTG Patient will perform upper body dressing with assist, with/without cues (OT). Flowsheets (Taken 11/27/2019 0829) LTG: Pt will perform upper body dressing with assistance level of: (Upgraded due to progress towards goals) Minimal Assistance - Patient > 75% Note: Upgraded due to progress towards goals Goal: LTG Patient will perform lower body dressing w/assist (OT) Description: LTG: Patient will perform lower body dressing with assist, with/without cues in positioning using equipment (OT) Flowsheets (Taken 11/27/2019 0829) LTG: Pt will perform lower body dressing with assistance level of: (Upgraded due to progress towards goals) Minimal Assistance - Patient > 75% Note: Upgraded due to progress towards goals   Problem: RH Toileting Goal: LTG Patient will perform toileting task (3/3 steps) with assistance level (OT) Description: LTG: Patient will perform toileting task (3/3 steps) with assistance level (OT)  Flowsheets  (Taken 11/27/2019 0829) LTG: Pt will perform toileting task (3/3 steps) with assistance level: (Upgraded due to progress towards goals) Minimal Assistance - Patient > 75%   Problem: RH Toilet Transfers Goal: LTG Patient will perform toilet transfers w/assist (OT) Description: LTG: Patient will perform toilet transfers with assist, with/without cues using equipment (OT) Flowsheets (Taken 11/27/2019 0829) LTG: Pt will perform toilet transfers with assistance level of: (Upgraded due to progress towards goals) Minimal Assistance - Patient > 75% Note: Upgraded due to progress towards goals   Problem: RH Tub/Shower Transfers Goal: LTG Patient will perform tub/shower transfers w/assist (OT) Description: LTG: Patient will perform tub/shower transfers with assist, with/without cues using equipment (OT) Flowsheets (Taken 11/27/2019 0829) LTG: Pt will perform tub/shower stall transfers with assistance level of: (Upgraded due to progress towards goals) Minimal Assistance - Patient > 75% Note: Upgraded due to progress towards goals

## 2019-11-27 NOTE — Progress Notes (Signed)
Physical Therapy Weekly Progress Note  Patient Details  Name: [name] MRN: [id number] Date of Birth: 08/04/1951  Beginning of progress report period: November 02, 2019 End of progress report period: November 27, 2019  Today's Date: 11/27/2019 PT Individual Time: 1000-1100 PT Individual Time Calculation (min): 60 min   Patient has met 2 of 3 short term goals. Pt demonstrates improvements in functional mobility/transfers, LE strength, ambulation with RW, stair navigation, dynamic sitting and standing balance, and endurance. Pt currently requires supervision/CGA for bed mobility, mod A for transfers with/without RW, mod A for ambulation up to 45f with RW, mod A to navigate 4 steps with 2 rails, and supervision for WC mobility 549f However, pt continues to be limited by pain on her buttocks and low back. Pt eager to discharge home and motivated to participate in therapy.   Patient continues to demonstrate the following deficits muscle weakness, impaired timing and sequencing and decreased coordination and decreased sitting balance, decreased standing balance, decreased postural control, hemiplegia and decreased balance strategies and therefore will continue to benefit from skilled PT intervention to increase functional independence with mobility.  Patient progressing toward long term goals.. Continue plan of care.  PT Short Term Goals Week 4:  PT Short Term Goal 1 (Week 4): Pt will transfer supine<>sit and sit<>supine with CGA PT Short Term Goal 1 - Progress (Week 4): Met PT Short Term Goal 2 (Week 4): Pt will transfer stand<>pivot with LRAD mod A PT Short Term Goal 2 - Progress (Week 4): Met PT Short Term Goal 3 (Week 4): Pt will perform car transfer with LRAD mod A PT Short Term Goal 3 - Progress (Week 4): Progressing toward goal Week 5:  PT Short Term Goal 1 (Week 5): Pt will perform bed mobility with supervision PT Short Term Goal 2 (Week 5): pt will perform car transfer with LRAD  min A PT Short Term Goal 3 (Week 5): Pt will ambulate [id number] LRAD min A  Skilled Therapeutic Interventions/Progress Updates:  Ambulation/gait training;Discharge planning;DME/adaptive equipment instruction;Functional mobility training;Pain management;Psychosocial support;Splinting/orthotics;Therapeutic Activities;UE/LE Strength taining/ROM;Balance/vestibular training;Community reintegration;Disease management/prevention;Functional electrical stimulation;Neuromuscular re-education;Patient/family education;Stair training;Therapeutic Exercise;UE/LE Coordination activities;Wheelchair propulsion/positioning   Today's Interventions: Received pt supine in bed, pt agreeable to therapy, and did not state pain during session. Session focused on functional mobility/transfers, LE strength, stair navigation, and improved endurance. Pt performed bed mobility with supervision x 2 trials throughout session. Pt transferred scoot<>pivot bed<>WC mod A without AD. Pt performed WC mobility [id number] bilateral UE's and supervision. Pt navigated 4 steps with 2 rails mod A ascending and descending with a step to pattern and verbal cues for step sequence and technique. Pt began vomitting and requested to return to room. Pt determined to participate in therapy today stating "I want to show everyone I'm ready to go home".  Scoot<>pivot mod A back to bed. Pt stated she felt sick but still wanted to work on exercises in bed. RN present to administer fluids prior to pt leaving for dialysis. Pt performed supine bridges 3x10, hip adduction pillow squeezes 2x10, alternating marching with orange TB 3x10, bilateral sidelying clamshells with orange TB 2x10, bilateral sidelying reverse clamshells 2x10, bilateral sidelying hip extension against manual resistance provided by therapist 2x10. When rolling to R side pt stated she felt heartburn and began vomitting again. Therapist provided cool washcloth and repositioning to assist with  discomfort. Concluded session with pt sidelying in bed, needs within reach, and bed alarm on. RN aware of pt vomitting.   Therapy Documentation  Precautions:  Precautions Precautions: Fall Precaution Comments: Strong L lateral lean, TLSO for mobility Required Braces or Orthoses: Other Brace(TLSO) Spinal Brace: Thoracolumbosacral orthotic Spinal Brace Comments: for comfort with mobility Restrictions Weight Bearing Restrictions: No   Therapy/Group: Individual Therapy Alfonse Alpers PT, DPT  11/27/2019, 7:49 AM

## 2019-11-27 NOTE — Progress Notes (Signed)
Physical Therapy Session Note  Patient Details  Name: [name] MRN: [id number] Date of Birth: 05-08-1951  Today's Date: 11/27/2019 PT Individual Time: 0910-0940 PT Individual Time Calculation (min): 30 min   Short Term Goals: Week 5:  PT Short Term Goal 1 (Week 5): Pt will perform bed mobility with supervision PT Short Term Goal 2 (Week 5): pt will perform car transfer with LRAD CGA PT Short Term Goal 3 (Week 5): Pt will ambulate 37ft with LRAD CGA  Skilled Therapeutic Interventions/Progress Updates:  Session focused on functional transfers, blocked practice sit <> stands for strengthening and focus on technique, and overall endurance/activity tolerance. Requires overall mod assist for sit <> stands with various techniques and hand placement attempted. Really focused on anterior weightshift and controlled descent. Min assist for transfer once up. Pt with + emesis during session and RN notified. Returned to bed at end of session with all needs in reach.  Performed squat pivot transfers with mod assist as well due to need for power up due to proximal weakness.   Therapy Documentation Precautions:  Precautions Precautions: Fall Precaution Comments: Strong L lateral lean, TLSO for mobility Required Braces or Orthoses: Other Brace(TLSO) Spinal Brace: Thoracolumbosacral orthotic Spinal Brace Comments: for comfort with mobility Restrictions Weight Bearing Restrictions: No   Pain: Denies pain. Reports nausea during session.   Therapy/Group: Individual Therapy  Canary Brim Ivory Broad, PT, DPT, CBIS  11/27/2019, 9:54 AM

## 2019-11-27 NOTE — Plan of Care (Signed)
Problem: RH Balance Goal: LTG Patient will maintain dynamic sitting balance (PT) Description: LTG:  Patient will maintain dynamic sitting balance with assistance during mobility activities (PT) Flowsheets (Taken 11/27/2019 0744) LTG: Pt will maintain dynamic sitting balance during mobility activities with:: (upgraded due to improved balance) Supervision/Verbal cueing Note: upgraded due to improved balance Goal: LTG Patient will maintain dynamic standing balance (PT) Description: LTG:  Patient will maintain dynamic standing balance with assistance during mobility activities (PT) 11/27/2019 1447 by Blenda Nicely, PT Flowsheets (Taken 11/27/2019 1447) LTG: Pt will maintain dynamic standing balance during mobility activities with:: (downgraded due to increased fatigue and LE weakness) Minimal Assistance - Patient > 75% Note: downgraded due to increased fatigue and LE weakness 11/27/2019 0744 by Blenda Nicely, PT Flowsheets (Taken 11/27/2019 0744) LTG: Pt will maintain dynamic standing balance during mobility activities with:: (upgraded due to improved balance and LE strength) Supervision/Verbal cueing Note: upgraded due to improved balance and LE strength   Problem: Sit to Stand Goal: LTG:  Patient will perform sit to stand with assistance level (PT) Description: LTG:  Patient will perform sit to stand with assistance level (PT) 11/27/2019 1447 by Blenda Nicely, PT Flowsheets (Taken 11/27/2019 1447) LTG: PT will perform sit to stand in preparation for functional mobility with assistance level: (downgraded due to increased fatigue and LE weakness) Minimal Assistance - Patient > 75% Note: downgraded due to increased fatigue and LE weakness 11/27/2019 0744 by Blenda Nicely, PT Flowsheets (Taken 11/27/2019 [phone]) LTG: PT will perform sit to stand in preparation for functional mobility with assistance level: (upgraded due to improved balance and LE strength) Supervision/Verbal cueing Note: upgraded due to  improved balance and LE strength   Problem: RH Bed Mobility Goal: LTG Patient will perform bed mobility with assist (PT) Description: LTG: Patient will perform bed mobility with assistance, with/without cues (PT). Flowsheets (Taken 11/27/2019 0744) LTG: Pt will perform bed mobility with assistance level of: (upgraded due to improved balance) Supervision/Verbal cueing Note: upgraded due to improved balance    Problem: RH Bed to Chair Transfers Goal: LTG Patient will perform bed/chair transfers w/assist (PT) Description: LTG: Patient will perform bed to chair transfers with assistance (PT). 11/27/2019 1447 by Blenda Nicely, PT Flowsheets (Taken 11/27/2019 1447) LTG: Pt will perform Bed to Chair Transfers with assistance level: (downgraded due to increased fatigue and LE weakness) Minimal Assistance - Patient > 75% Note: downgraded due to increased fatigue and LE weakness 11/27/2019 0744 by Blenda Nicely, PT Flowsheets (Taken 11/27/2019 0744) LTG: Pt will perform Bed to Chair Transfers with assistance level: (upgraded due to improved balance and LE strength) Supervision/Verbal cueing Note: upgraded due to improved balance and LE strength   Problem: RH Car Transfers Goal: LTG Patient will perform car transfers with assist (PT) Description: LTG: Patient will perform car transfers with assistance (PT). 11/27/2019 1447 by Blenda Nicely, [place] (Taken 11/27/2019 1447) LTG: Pt will perform car transfers with assist:: (downgraded due to increased fatigue and LE weakness) Minimal Assistance - Patient > 75% Note: downgraded due to increased fatigue and LE weakness 11/27/2019 0744 by Blenda Nicely, PT Flowsheets (Taken 11/27/2019 [phone]) LTG: Pt will perform car transfers with assist:: (upgraded due to improved balance and LE strength) Supervision/Verbal cueing Note: upgraded due to improved balance and LE strength   Problem: RH Ambulation Goal: LTG Patient will ambulate in controlled environment  (PT) Description: LTG: Patient will ambulate in a controlled environment, # of feet with assistance (PT). 11/27/2019  1447 by Blenda Nicely, PT Flowsheets (Taken 11/27/2019 1447) LTG: Pt will ambulate in controlled environ  assist needed:: (downgraded due to increased fatigue and LE weakness) Minimal Assistance - Patient > 75% LTG: Ambulation distance in controlled environment: 118ft with LRAD Note: downgraded due to increased fatigue and LE weakness 11/27/2019 0744 by Blenda Nicely, PT Flowsheets (Taken 11/27/2019 [phone]) LTG: Pt will ambulate in controlled environ  assist needed:: (upgraded due to improved balance and LE strength) Supervision/Verbal cueing LTG: Ambulation distance in controlled environment: 2ft with LRAD Note: upgraded due to improved balance and LE strength Goal: LTG Patient will ambulate in home environment (PT) Description: LTG: Patient will ambulate in home environment, # of feet with assistance (PT). 11/27/2019 1447 by Blenda Nicely, PT Flowsheets (Taken 11/27/2019 1447) LTG: Pt will ambulate in home environ  assist needed:: (downgraded due to increased fatigue and LE weakness) Minimal Assistance - Patient > 75% LTG: Ambulation distance in home environment: 66ft with LRAD Note: downgraded due to increased fatigue and LE weakness 11/27/2019 0744 by Blenda Nicely, PT Flowsheets (Taken 11/27/2019 0744) LTG: Pt will ambulate in home environ  assist needed:: (upgraded due to improved balance and LE strength) Supervision/Verbal cueing LTG: Ambulation distance in home environment: 70ft with LRAD Note: upgraded due to improved balance and LE strength   Problem: RH Wheelchair Mobility Goal: LTG Patient will propel w/c in controlled environment (PT) Description: LTG: Patient will propel wheelchair in controlled environment, # of feet with assist (PT) 11/27/2019 1447 by Blenda Nicely, PT Flowsheets (Taken 11/27/2019 1447) LTG: Pt will propel w/c in controlled environ  assist needed::  (Upgraded due to improved UE endurance) Supervision/Verbal cueing LTG: Propel w/c distance in controlled environment: 121ft Note: Upgraded due to improved UE endurance 11/27/2019 0744 by Blenda Nicely, PT Flowsheets (Taken 11/27/2019 [phone]) LTG: Pt will propel w/c in controlled environ  assist needed:: (upgraded due to improved activity tolerance) Supervision/Verbal cueing LTG: Propel w/c distance in controlled environment: 41ft Note: upgraded due to improved activity tolerance Goal: LTG Patient will propel w/c in home environment (PT) Description: LTG: Patient will propel wheelchair in home environment, # of feet with assistance (PT). 11/27/2019 1447 by Blenda Nicely, [place] (Taken 11/27/2019 1447) LTG: Pt will propel w/c in home environ  assist needed:: (Upgraded due to improved UE endurance) Supervision/Verbal cueing LTG: Propel w/c distance in home environment: 96ft Note: Upgraded due to improved UE endurance 11/27/2019 0744 by Blenda Nicely, PT Flowsheets (Taken 11/27/2019 [phone]) LTG: Pt will propel w/c in home environ  assist needed:: (upgraded due to improved activity tolerance) Supervision/Verbal cueing LTG: Propel w/c distance in home environment: 38ft Note: upgraded due to improved activity tolerance   Problem: RH Stairs Goal: LTG Patient will ambulate up and down stairs w/assist (PT) Description: LTG: Patient will ambulate up and down # of stairs with assistance (PT) 11/27/2019 1447 by Blenda Nicely, PT Flowsheets (Taken 11/27/2019 1447) LTG: Pt will ambulate up/down stairs assist needed:: (Downgraded due to fatigue and LE weakness) Minimal Assistance - Patient > 75% LTG: Pt will  ambulate up and down number of stairs: 3 stairs with 2 rails Note: Downgraded due to fatigue and LE weakness 11/27/2019 0744 by Blenda Nicely, PT Flowsheets (Taken 11/27/2019 0744) LTG: Pt will ambulate up/down stairs assist needed:: (upgraded due to improved balance and LE strength) Contact  Guard/Touching assist LTG: Pt will  ambulate up and down number of stairs: 3 stairs with 2 rails Note: upgraded due to  improved balance and LE strength

## 2019-11-27 NOTE — Progress Notes (Signed)
[place] PHYSICAL MEDICINE & REHABILITATION PROGRESS NOTE   Subjective/Complaints: Patient seen laying in bed this morning.  She states she slept well overnight.  She states a good weekend.  She is about to work with therapies.  She has questions regarding plans for dialysis given treatment on Friday and Saturday discussed with therapies.  She notes improvement in hip pain with gel.  Weekend notes reviewed.  ROS: Denies CP, SOB, N/V/D  Objective:   No results found. Recent Labs    11/24/19 1230 11/27/19 0529  WBC 10.0 8.2  HGB 8.5* 9.2*  HCT 28.4* 30.3*  PLT 255 266   Recent Labs    11/24/19 1231  NA 139  K 3.6  CL 99  CO2 28  GLUCOSE 148*  BUN 24*  CREATININE 5.98*  CALCIUM 8.5*    Intake/Output Summary (Last 24 hours) at 11/27/2019 0844 Last data filed at 11/26/2019 1837 Gross per 24 hour  Intake 220 ml  Output --  Net 220 ml     Physical Exam: Vital Signs Blood pressure 106/72, pulse 88, temperature 98.5 F (36.9 C), temperature source Oral, resp. rate 18, height 5\' 7"  (1.702 m), weight 55.8 kg, SpO2 100 %.  Constitutional: No distress . Vital signs reviewed. HENT: Normocephalic.  Atraumatic. Eyes: EOMI. No discharge. Cardiovascular: No JVD. Respiratory: Normal effort.  No stridor. GI: Non-distended. Skin: Warm and dry.  Intact. Psych: Normal mood.  Normal behavior. Musc: No edema in extremities.  No tenderness in extremities. Neuro: Alert and oriented x3 Motor:  RUE: 5/5 proximal to distal RLE: HF, KE 4+/5, ADF 4+/5 (pain inhibition), stable LUE: 4+/5 proximal to distal LLE: HF, KE 4+/5, ADF 4+/5 (pain inhibition), stable  Assessment/Plan: 1. Functional deficits secondary to right basal ganglia stroke which require 3+ hours per day of interdisciplinary therapy in a comprehensive inpatient rehab setting.  Physiatrist is providing close team supervision and 24 hour management of active medical problems listed below.  Physiatrist and rehab team  continue to assess barriers to discharge/monitor patient progress toward functional and medical goals  Care Tool:  Bathing    Body parts bathed by patient: Right arm, Left arm, Chest, Abdomen, Front perineal area, Right upper leg, Left upper leg, Face, Right lower leg, Left lower leg   Body parts bathed by helper: Buttocks     Bathing assist Assist Level: Minimal Assistance - Patient > 75%     Upper Body Dressing/Undressing Upper body dressing   What is the patient wearing?: Orthosis, Pull over shirt    Upper body assist Assist Level: Minimal Assistance - Patient > 75%    Lower Body Dressing/Undressing Lower body dressing      What is the patient wearing?: Pants     Lower body assist Assist for lower body dressing: Minimal Assistance - Patient > 75%     Toileting Toileting    Toileting assist Assist for toileting: Maximal Assistance - Patient 25 - 49%     Transfers Chair/bed transfer  Transfers assist     Chair/bed transfer assist level: Moderate Assistance - Patient 50 - 74% Chair/bed transfer assistive device: Programmer, multimedia   Ambulation assist   Ambulation activity did not occur: Safety/medical concerns(back pain, bilateral LE weakness, poor bilateral knee control)  Assist level: Minimal Assistance - Patient > 75% Assistive device: Walker-rolling Max distance: 75'   Walk 10 feet activity   Assist  Walk 10 feet activity did not occur: Safety/medical concerns(back pain, bilateral LE weakness, poor bilateral knee  control)  Assist level: Minimal Assistance - Patient > 75% Assistive device: Walker-rolling   Walk 50 feet activity   Assist Walk 50 feet with 2 turns activity did not occur: Safety/medical concerns(back pain, bilateral LE weakness, poor bilateral knee control)  Assist level: Minimal Assistance - Patient > 75% Assistive device: Walker-rolling    Walk 150 feet activity   Assist Walk 150 feet activity did not occur:  Safety/medical concerns(back pain, bilateral LE weakness, poor bilateral knee control)         Walk 10 feet on uneven surface  activity   Assist Walk 10 feet on uneven surfaces activity did not occur: Safety/medical concerns(back pain, bilateral LE weakness, poor bilateral knee control)         Wheelchair     Assist Will patient use wheelchair at discharge?: Yes Type of Wheelchair: Manual Wheelchair activity did not occur: Safety/medical concerns(Pt could not tolerate sitting in WC for long enough to complete activity due to low back pain)  Wheelchair assist level: Supervision/Verbal cueing Max wheelchair distance: 23'    Wheelchair 50 feet with 2 turns activity    Assist    Wheelchair 50 feet with 2 turns activity did not occur: Safety/medical concerns(Pt could not tolerate sitting in [facility] for long enough to complete activity due to low back pain)       Wheelchair 150 feet activity     Assist  Wheelchair 150 feet activity did not occur: Safety/medical concerns(Pt could not tolerate sitting in [facility] for long enough to complete activity due to low back pain)       Blood pressure 106/72, pulse 88, temperature 98.5 F (36.9 C), temperature source Oral, resp. rate 18, height 5\' 7"  (1.702 m), weight 55.8 kg, SpO2 100 %.    Medical Problem List and Plan: 1.  Impaired gait with left side weakness secondary to right basal ganglia infarction as well as small cortical infarct left frontal lobe  CT head on 12/13 personally reviewed, showing some improvement, MRI personally reviewed, stable. Repeat MRI ordered again by ID, showing stable infarcts  Chest x-ray personally reviewed, unremarkable for infection  Discussed with neurology results of the EEG -findings possibly related to uremia +/- cefepime  Discussed with psychiatry, continue medical management  Discussed with hospitalist, appreciate recs, discussed with hospitalist again on 12/25  Continue CIR 2.   Antithrombotics: -DVT/anticoagulation: Right IJ acute DVT.  Plan anticoagulation x3 months  Transitioned to Eliquis as it has been very difficult to manage INR    Repeat U/S suggesting chronic DVT             -antiplatelet therapy: N/A 3. Pain Management chronic back and left hip pain: Neurontin 100 mg on hold  TLSO back brace for comfort  Lidocaine patch added to coccyx  Voltaren gel added for left greater trochanteric area on 1/1 with improvement 4. Mood: Provide emotional support             -antipsychotic agents: N/A 5. Neuropsych: This patient is not capable of making decisions on her own behalf. 6. Skin/Wound Care: Routine skin checks  Santyl to sacral wound - WOC notes reviewed 7. Fluids/Electrolytes/Nutrition: Routine in and outs 8.  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  New HD tunneled catheter placed 10/26/2019.  Repeat echo showing increase in mitral regurg  CT surgery recommending mitral valve replacement- will follow as outpatient. 9.  ID/discitis/recurrent Pseudomonas bacteremia.    Cipro/Maxipime changed to ceftazidime/Cipro through 12/06/2019.  Seen by neurosurgery on 12/25, no surgical intervention recommended at this point, however recommended following ID recs  Appreciate ID recs  10.  Hemodialysis MWF.  Follow-up per renal services.  11.  Diabetes mellitus.  Hemoglobin A1c 7.6.  SSI. CBG (last 3)  Recent Labs    11/26/19 1625 11/26/19 2113 11/27/19 0618  GLUCAP 112* 164* 173*   ?  Trending up on 1/4 12.  Hypertension.  Imdur 30 mg daily  Coreg 6.25 mg twice daily, increased to 12.5 on 12/15, parameters placed.    Monitor with increased mobility.  Soft, but asymptomatic on 1/4 13.  Acute on chronic anemia.    Hemoglobin 9.2 on 1/4  Continue to monitor 14. Constipation:  See #16  Abd xray showing?  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   Candidiasis  Diflucan ordered on 12/16  Repeat Ucx not collected yet on 1/4 16.  Hyperammonemia with hepatic encephalopathy  Lactulose started on 12/17, encourage compliance  Ammonia WNL on 12/22 17. Sleep disturance  Melatonin added on 12/17  Sleep chart ordered  Improving 18.  Leukocytosis: Resolved  WBCs 8.2 on 1/4, labs with HD  Afebrile  Continue to monitor 19.  GERD  Home Protonix ordered on 12/20 20.  Pneumatosis Coli  CT abdomen/pelvis on 12/24?  Obese.  Appreciate surgery recs-recommending GI recs, no surgical intervention, signed off  Appreciate GI recs, advising against colonoscopy and recommending conservative care at present with repeat imaging in 1-3 months.  LOS: 26 days A FACE TO FACE EVALUATION WAS PERFORMED  Joaquin Knebel Lorie Phenix 11/27/2019, 8:44 AM

## 2019-11-28 ENCOUNTER — Inpatient Hospital Stay (HOSPITAL_COMMUNITY): Payer: Medicare PPO

## 2019-11-28 ENCOUNTER — Inpatient Hospital Stay (HOSPITAL_COMMUNITY): Payer: Medicare PPO | Admitting: Speech Pathology

## 2019-11-28 ENCOUNTER — Inpatient Hospital Stay (HOSPITAL_COMMUNITY): Payer: Medicare PPO | Admitting: Occupational Therapy

## 2019-11-28 LAB — GLUCOSE, CAPILLARY
Glucose-Capillary: 88 mg/dL (ref 70–99)
Glucose-Capillary: 120 mg/dL — ABNORMAL HIGH (ref 70–99)
Glucose-Capillary: 149 mg/dL — ABNORMAL HIGH (ref 70–99)
Glucose-Capillary: 167 mg/dL — ABNORMAL HIGH (ref 70–99)

## 2019-11-28 NOTE — Progress Notes (Signed)
  Subjective: Patient has endocarditis of mitral valve with compensated moderate MR on coreg - will finish 6 wks of antibiotics Jan 13  I will follow patient in office but at this point she would not meet criteria for MVR and medical therapy of MR is the best therapy. Rec cardiology followup to optimize meds for her MR  Objective: Vital signs in last 24 hours: Temp:  [97.9 F (36.6 C)-98.6 F (37 C)] 97.9 F (36.6 C) (01/04 2044) Pulse Rate:  [73-102] 73 (01/04 2044) Resp:  [18] 18 (01/04 2044) BP: (73-158)/(54-88) 136/78 (01/04 2044) SpO2:  [98 %-100 %] 100 % (01/04 2044) Weight:  [60 kg-61 kg] 60 kg (01/04 1811)  Hemodynamic parameters for last 24 hours:  stable  Intake/Output from previous day: 01/04 0701 - 01/05 0700 In: 420 [P.O.:420] Out: 967  Intake/Output this shift: No intake/output data recorded.  regular HR  No murmur  Lab Results: Recent Labs    11/27/19 0529 11/27/19 1625  WBC 8.2 7.8  HGB 9.2* 8.9*  HCT 30.3* 29.2*  PLT 266 254   BMET:  Recent Labs    11/27/19 1625  NA 140  K 3.8  CL 98  CO2 29  GLUCOSE 178*  BUN 40*  CREATININE 8.15*  CALCIUM 8.6*    PT/INR: No results for input(s): LABPROT, INR in the last 72 hours. ABG    Component Value Date/Time   TCO2 27 10/27/2018 0619   CBG (last 3)  Recent Labs    11/27/19 1405 11/27/19 2137 11/28/19 0613  GLUCAP 108* 125* 88    Assessment/Plan: S/P IV antibiotics for SBE - infected HD access site Will follow as outpatient   LOS: 27 days    Tharon Aquas Trigt III 11/28/2019

## 2019-11-28 NOTE — Progress Notes (Signed)
Occupational Therapy Session Note  Patient Details  Name: [name] MRN: [id number] Date of Birth: 1951-10-10  Today's Date: 11/28/2019 OT Individual Time: 3795-5831 OT Individual Time Calculation (min): 13 min  Missed 17 minutes due to vomiting  Short Term Goals: Week 3:  OT Short Term Goal 1 (Week 3): Pt will perform shower transfer with mod A. OT Short Term Goal 1 - Progress (Week 3): Met OT Short Term Goal 2 (Week 3): Pt will perform toileting with mod A. OT Short Term Goal 2 - Progress (Week 3): Met OT Short Term Goal 3 (Week 3): Pt will complete LB dressing with mod assist OT Short Term Goal 3 - Progress (Week 3): Met OT Short Term Goal 4 (Week 3): Pt will tolerate sitting OOB 2 hrs to demonstrate increased activity tolerance OT Short Term Goal 4 - Progress (Week 3): Progressing toward goal  Skilled Therapeutic Interventions/Progress Updates:    patient in bed, alert, states that she just went back to bed due to nausea.  She is agreeable to sitting edge of bed to see how she feels, able to move from side lying to SSP with CS using bed rails.  Upon sitting nausea increased, she returned to side lying and vomited into collection bag.  Provided wash cloth.  She continued to vomit small amounts x2.  She remained in side lying, alert and aware of needs, able to adjust volume on call bell, nursing alerted.    Therapy Documentation Precautions:  Precautions Precautions: Fall Precaution Comments: Strong L lateral lean, TLSO for mobility Required Braces or Orthoses: Other Brace(TLSO) Spinal Brace: Thoracolumbosacral orthotic Spinal Brace Comments: for comfort with mobility Restrictions Weight Bearing Restrictions: No General: General OT Amount of Missed Time: 17 Minutes Vital Signs: Therapy Vitals Temp: 97.9 F (36.6 C) Temp Source: Oral Pulse Rate: 94 Resp: 18 BP: 125/75 Patient Position (if appropriate): Lying Oxygen Therapy SpO2: 100 % O2 Device: Room  Air Pain: Pain Assessment Pain Scale: 0-10 Pain Score: 0-No pain Faces Pain Scale: Hurts even more Pain Type: Acute pain Pain Location: Hip Pain Orientation: Right Pain Descriptors / Indicators: Aching Patients Stated Pain Goal: 1 Pain Intervention(s): Medication (See eMAR) Multiple Pain Sites: No   Other Treatments:     Therapy/Group: Individual Therapy  Carlos Levering 11/28/2019, 9:58 AM

## 2019-11-28 NOTE — Progress Notes (Signed)
[place] PHYSICAL MEDICINE & REHABILITATION PROGRESS NOTE   Subjective/Complaints: Patient seen laying in bed this morning.  She states she slept fairly well overnight, confirmed with sleep chart.  She states she does not like the bed and it causes her bottom to hurt.  ROS: Denies CP, SOB, N/V/D  Objective:   No results found. Recent Labs    11/27/19 0529 11/27/19 1625  WBC 8.2 7.8  HGB 9.2* 8.9*  HCT 30.3* 29.2*  PLT 266 254   Recent Labs    11/27/19 1625  NA 140  K 3.8  CL 98  CO2 29  GLUCOSE 178*  BUN 40*  CREATININE 8.15*  CALCIUM 8.6*    Intake/Output Summary (Last 24 hours) at 11/28/2019 0901 Last data filed at 11/27/2019 1811 Gross per 24 hour  Intake 150 ml  Output 967 ml  Net -817 ml     Physical Exam: Vital Signs Blood pressure 105/80, pulse 84, temperature 97.9 F (36.6 C), resp. rate 18, height 5\' 7"  (1.702 m), weight 60 kg, SpO2 100 %.  Constitutional: No distress . Vital signs reviewed. HENT: Normocephalic.  Atraumatic. Eyes: EOMI. No discharge. Cardiovascular: No JVD. Respiratory: Normal effort.  No stridor. GI: Non-distended. Skin: Warm and dry.  Intact. Psych: Normal mood.  Normal behavior. Musc: No edema in extremities.  No tenderness in extremities. Neuro: Alert and oriented x3 Motor:  RUE: 5/5 proximal to distal RLE: HF, KE 4+/5, ADF 4+/5 (pain inhibition), unchanged LUE: 4+/5 proximal to distal LLE: HF, KE 4+/5, ADF 4+/5 (pain inhibition), unchanged  Assessment/Plan: 1. Functional deficits secondary to right basal ganglia stroke which require 3+ hours per day of interdisciplinary therapy in a comprehensive inpatient rehab setting.  Physiatrist is providing close team supervision and 24 hour management of active medical problems listed below.  Physiatrist and rehab team continue to assess barriers to discharge/monitor patient progress toward functional and medical goals  Care Tool:  Bathing    Body parts bathed by patient:  Right arm, Left arm, Chest, Abdomen, Front perineal area, Right upper leg, Left upper leg, Face, Right lower leg, Left lower leg   Body parts bathed by helper: Buttocks     Bathing assist Assist Level: Minimal Assistance - Patient > 75%     Upper Body Dressing/Undressing Upper body dressing   What is the patient wearing?: Orthosis, Pull over shirt    Upper body assist Assist Level: Minimal Assistance - Patient > 75%    Lower Body Dressing/Undressing Lower body dressing      What is the patient wearing?: Pants     Lower body assist Assist for lower body dressing: Minimal Assistance - Patient > 75%     Toileting Toileting    Toileting assist Assist for toileting: Maximal Assistance - Patient 25 - 49%     Transfers Chair/bed transfer  Transfers assist     Chair/bed transfer assist level: Moderate Assistance - Patient 50 - 74% Chair/bed transfer assistive device: Programmer, multimedia   Ambulation assist   Ambulation activity did not occur: Safety/medical concerns(back pain, bilateral LE weakness, poor bilateral knee control)  Assist level: Minimal Assistance - Patient > 75% Assistive device: Walker-rolling Max distance: 75'   Walk 10 feet activity   Assist  Walk 10 feet activity did not occur: Safety/medical concerns(back pain, bilateral LE weakness, poor bilateral knee control)  Assist level: Minimal Assistance - Patient > 75% Assistive device: Walker-rolling   Walk 50 feet activity   Assist Walk 50 feet  with 2 turns activity did not occur: Safety/medical concerns(back pain, bilateral LE weakness, poor bilateral knee control)  Assist level: Minimal Assistance - Patient > 75% Assistive device: Walker-rolling    Walk 150 feet activity   Assist Walk 150 feet activity did not occur: Safety/medical concerns(back pain, bilateral LE weakness, poor bilateral knee control)         Walk 10 feet on uneven surface  activity   Assist Walk 10  feet on uneven surfaces activity did not occur: Safety/medical concerns(back pain, bilateral LE weakness, poor bilateral knee control)         Wheelchair     Assist Will patient use wheelchair at discharge?: Yes Type of Wheelchair: Manual Wheelchair activity did not occur: Safety/medical concerns(Pt could not tolerate sitting in WC for long enough to complete activity due to low back pain)  Wheelchair assist level: Supervision/Verbal cueing Max wheelchair distance: 44ft    Wheelchair 50 feet with 2 turns activity    Assist    Wheelchair 50 feet with 2 turns activity did not occur: Safety/medical concerns(Pt could not tolerate sitting in WC for long enough to complete activity due to low back pain)   Assist Level: Supervision/Verbal cueing   Wheelchair 150 feet activity     Assist  Wheelchair 150 feet activity did not occur: Safety/medical concerns(Pt could not tolerate sitting in WC for long enough to complete activity due to low back pain)       Blood pressure 105/80, pulse 84, temperature 97.9 F (36.6 C), resp. rate 18, height 5\' 7"  (1.702 m), weight 60 kg, SpO2 100 %.    Medical Problem List and Plan: 1.  Impaired gait with left side weakness secondary to right basal ganglia infarction as well as small cortical infarct left frontal lobe  CT head on 12/13 personally reviewed, showing some improvement, MRI personally reviewed, stable. Repeat MRI ordered again by ID, showing stable infarcts  Chest x-ray personally reviewed, unremarkable for infection  Discussed with neurology results of the EEG -findings possibly related to uremia +/- cefepime  Discussed with psychiatry, continue medical management  Discussed with hospitalist, appreciate recs, discussed with hospitalist again on 12/25  Continue CIR 2.  Antithrombotics: -DVT/anticoagulation: Right IJ acute DVT.  Plan anticoagulation x3 months  Transitioned to Eliquis as it has been very difficult to manage INR     Repeat U/S suggesting chronic DVT             -antiplatelet therapy: N/A 3. Pain Management chronic back and left hip pain: Neurontin 100 mg on hold  TLSO back brace for comfort  Lidocaine patch added to coccyx  Voltaren gel added for left greater trochanteric area on 1/1 with improvement 4. Mood: Provide emotional support             -antipsychotic agents: N/A 5. Neuropsych: This patient is not capable of making decisions on her own behalf. 6. Skin/Wound Care: Routine skin checks  Santyl to sacral wound - WOC notes reviewed 7. Fluids/Electrolytes/Nutrition: Routine in and outs 8.  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  New HD tunneled catheter placed 10/26/2019.  Repeat echo showing increase in mitral regurg  CT surgery recommending mitral valve replacement- will follow as outpatient. 9.  ID/discitis/recurrent Pseudomonas bacteremia.    Cipro/Maxipime changed to ceftazidime/Cipro through 12/06/2019.  Seen by neurosurgery on 12/25, no surgical intervention recommended at this point, however recommended following ID recs  Appreciate ID recs  10.  Hemodialysis MWF.  Follow-up per renal services.  11.  Diabetes mellitus.  Hemoglobin A1c 7.6.  SSI. CBG (last 3)  Recent Labs    11/27/19 1405 11/27/19 2137 11/28/19 0613  GLUCAP 108* 125* 88   Labile on 1/5 12.  Hypertension.  Imdur 30 mg daily  Coreg 6.25 mg twice daily, increased to 12.5 on 12/15, parameters placed.    Monitor with increased mobility.  Relatively controlled on 1/5 13.  Acute on chronic anemia.    Hemoglobin 8.9 on 1/4  Continue to monitor 14. Constipation:  See #16  Abd xray showing?  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  Candidiasis  Diflucan ordered on 12/16  Repeat Ucx not collected yet on 1/4 16.  Hyperammonemia with hepatic encephalopathy  Lactulose started on 12/17, encourage  compliance  Ammonia WNL on 12/22 17. Sleep disturance  Melatonin added on 12/17  Sleep chart ordered  Improving 18.  Leukocytosis: Resolved  Afebrile  Continue to monitor 19.  GERD  Home Protonix ordered on 12/20 20.  Pneumatosis Coli  CT abdomen/pelvis on 12/24?  Obese.  Appreciate surgery recs-recommending GI recs, no surgical intervention, signed off  Appreciate GI recs, advising against colonoscopy and recommending conservative care at present with repeat imaging in 1-3 months.  LOS: 27 days A FACE TO FACE EVALUATION WAS PERFORMED  Lakshmi Sundeen Lorie Phenix 11/28/2019, 9:01 AM

## 2019-11-28 NOTE — Progress Notes (Signed)
Occupational Therapy Session Note  Patient Details  Name: [name] MRN: [id number] Date of Birth: 1951-03-28  Today's Date: 11/28/2019 OT Individual Time: [id number] OT Individual Time Calculation (min): 42 min  and Today's Date: 11/28/2019 OT Missed Time: 18 Minutes Missed Time Reason: Pain;Patient fatigue;Patient ill (comment)   Short Term Goals: Week 4:  OT Short Term Goal 1 (Week 4): Pt will tolerate sitting OOB 2 hrs to demonstrate increased activity tolerance OT Short Term Goal 2 (Week 4): Pt will compete bathing with mod assist OT Short Term Goal 3 (Week 4): Pt will complete LB dressing at sit > stand level with min assist OT Short Term Goal 4 (Week 4): Pt will don socks and shoes with min assist OT Short Term Goal 5 (Week 4): Pt will complete toilet transfer with min assist  Skilled Therapeutic Interventions/Progress Updates:    Treatment session with focus on BUE/BLE strengthening and activity tolerance.  Pt reports not feeling well today, having vomited during her therapy sessions.  Pt reports not wanting to "stir anything up" and requesting to not get OOB for therapy this session.  Engaged in [place] with 2 sets of 10 chest presses, alternating punches, and diagonal pulls with level 2 theraband.  Completed clamshells in bed and bridging with focus on BLE strengthening as needed for mobility, pressure relief, and LB self-care.  During rolling from side to side for exercises, pt reporting increased nausea and requesting to rest.  Pt missed final 18 mins of therapy session.  Therapy Documentation Precautions:  Precautions Precautions: Fall Precaution Comments: Strong L lateral lean, TLSO for mobility Required Braces or Orthoses: Other Brace(TLSO) Spinal Brace: Thoracolumbosacral orthotic Spinal Brace Comments: for comfort with mobility Restrictions Weight Bearing Restrictions: No General: General OT Amount of Missed Time: 18 Minutes Vital Signs: Therapy  Vitals Pulse Rate: 92 Resp: 16 BP: 139/71 Patient Position (if appropriate): Lying Oxygen Therapy SpO2: 100 % O2 Device: Room Air Pain:  Pt with c/o pain Lt hip and buttocks.  Repositioned.   Therapy/Group: Individual Therapy  [name] 11/28/2019, 4:04 PM

## 2019-11-28 NOTE — Progress Notes (Signed)
Physical Therapy Session Note  Patient Details  Name: [name] MRN: [id number] Date of Birth: 08/10/1951  Today's Date: 11/28/2019 PT Individual Time: 0800-0900 PT Individual Time Calculation (min): 60 min   Short Term Goals: Week 3:  PT Short Term Goal 1 (Week 3): Pt will transfer supine<>sit and sit<>supine with min A PT Short Term Goal 1 - Progress (Week 3): Met PT Short Term Goal 2 (Week 3): Pt will transfer stand<>pivot with LRAD mod A PT Short Term Goal 2 - Progress (Week 3): Not progressing PT Short Term Goal 3 (Week 3): Pt will perform car transfer with LRAD mod A PT Short Term Goal 3 - Progress (Week 3): Not progressing Week 4:  PT Short Term Goal 1 (Week 4): Pt will transfer supine<>sit and sit<>supine with CGA PT Short Term Goal 1 - Progress (Week 4): Met PT Short Term Goal 2 (Week 4): Pt will transfer stand<>pivot with LRAD mod A PT Short Term Goal 2 - Progress (Week 4): Met PT Short Term Goal 3 (Week 4): Pt will perform car transfer with LRAD mod A PT Short Term Goal 3 - Progress (Week 4): Progressing toward goal  Skilled Therapeutic Interventions/Progress Updates:   Received pt supine in bed, pt agreeable to therapy, and denied pain at beginning of session. Session focused on functional mobility/transfers, ambulation with RW, LE strength, simulated car transfers, balance/coordination, and improved endurance with activity. Pt performed bed mobility with supervision and increased time. Pt transferred scoot<>pivot bed<>WC min A. Pt performed WC mobility 23f with bilateral UE and supervision. Pt performed car transfer with RW mod A. Pt ambulated 127fon uneven surfaces (ramp) with RW min A. Pt performed furniture transfer on recliner with RW max A to transfer sit<>stand due to low surface. Pt with poor eccentric control upon descent from stand<>sit. Pt ambulated [id number] 2 trials with RW min A. Pt demonstrated flexed trunk, decreased stride length, and narrow BOS. Pt reported  increased L hip pain and requested to return to room and lie down. Therapist provided pt with 2 hot packs and stretching to alleviate pain and encouraged pt to continue therapy. However pt with increased agitation and demanded to return to room. Pt transferred scoot<>pivot mod A and sit<>supine with supervision. Pt reported increased nausea and therapist provided cool washcloth and vomit bag. Pt transferred supine<>sitting EOB and vomited. Therapist notifed RN of vomitting and pain status and RN adminstered pain medication. RN present to assess vitals. Concluded session with pt supine in bed, needs within reach, and bed alarm on.   Therapy Documentation Precautions:  Precautions Precautions: Fall Precaution Comments: Strong L lateral lean, TLSO for mobility Required Braces or Orthoses: Other Brace(TLSO) Spinal Brace: Thoracolumbosacral orthotic Spinal Brace Comments: for comfort with mobility Restrictions Weight Bearing Restrictions: No   Therapy/Group: Individual Therapy AnnAlfonse Alpers, DPT   11/28/2019, 7:31 AM

## 2019-11-28 NOTE — Progress Notes (Signed)
[facility] Progress Note    Assessment/ Plan:   1. Acute CVA with evidence of chronic ischemia/old infarcts:admit Oct 20, 2019, L sided weakness. Seen by neurology-suspected embolic infarct from endocarditis. Ongoing inpatient rehabilitation for management of neurological deficits. Repeat MRI showedno acute changes 2. Pseudomonas MV endocarditis/ bacteremia: HD cath exchanged for infection focus control. Seen by TCTS w/ no plan for surgical intervention.Antiboitic through 12/06/19. 3. Progression L3-4 discitis on MRI. Evaluated by neurosurgery. No indication for surgical intervention. ID has directed ABX and duration. Antibiotics changed to Fortaz/Cipro.  4. R IJ acute DVT: on 3 mos Eliquis (change from coumadin due to difficulties managing INR) 5. ESRD: MWF HD. ESRD x 1 year, tried AVF "6 times" w/o success. TDC dependent. Next HD Wed.  Generally 1L UF during treatments. 3K bath here. Tight heparin.  6. Anemia:hgb improved to 9s. ESA qMon. tsat 16% 12/15 - Fe held due to being treated for infection 7. 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  8. Hypertension/volume : BP low/stable. No vol^ on exam.  9.Nutrition -diet liberalized to regular - has supplements/multivits ordered - alb very low - not eating much - contributing to weakness 10. Deconditioning -in CIR - afternoon dialysis to help with AM therapy stamina     Subjective:    Seen in room, no new complaints. No SOB, CP or abd pain.  No fevers, chills.   Objective:   BP 125/75 (BP Location: Right Arm)   Pulse 94   Temp 97.9 F (36.6 C) (Oral)   Resp 18   Ht 5\' 7"  (1.702 m)   Wt 60 kg   SpO2 100%   BMI 20.72 kg/m   Intake/Output Summary (Last 24 hours) at 11/28/2019 1256 Last data filed at 11/28/2019 1048 Gross per 24 hour  Intake 120 ml  Output 1067 ml  Net -947 ml   Weight change:   HD Outpatient: [facility] MWF  3.5h  TDC (mult AVF attempts failed)   62.5kg  2/2.5 bath 400/600 - hect 1.5 ug - EPO 1200u each HD, iron 50mg  weekly  Physical Exam: Gen: NAD, on L side in bed vomiting. CVS: RRR Resp: clear bilaterally Abd: soft nontender NABS Ext: no LE edema  Imaging: No results found.  Labs: BMET Recent Labs  Lab 11/24/19 1231 11/27/19 1625  NA 139 140  K 3.6 3.8  CL 99 98  CO2 28 29  GLUCOSE 148* 178*  BUN 24* 40*  CREATININE 5.98* 8.15*  CALCIUM 8.5* 8.6*  PHOS 3.3 3.3   CBC Recent Labs  Lab 11/22/19 1407 11/24/19 1230 11/27/19 0529 11/27/19 1625  WBC 13.3* 10.0 8.2 7.8  NEUTROABS  --  6.9  --   --   HGB 8.2* 8.5* 9.2* 8.9*  HCT 26.6* 28.4* 30.3* 29.2*  MCV 91.7 94.0 93.8 92.4  PLT 231 255 266 254    Medications:    . acetaminophen  500 mg Oral TID  . apixaban  5 mg Oral BID  . calcium carbonate  1 tablet Oral 2 times per day  . carvedilol  12.5 mg Oral BID  . Chlorhexidine Gluconate Cloth  6 each Topical Q0600  . Chlorhexidine Gluconate Cloth  6 each Topical Q0600  . ciprofloxacin  500 mg Oral Q24H  . collagenase   Topical Daily  . darbepoetin (ARANESP) injection - DIALYSIS  100 mcg Intravenous Q Mon-HD  . diclofenac Sodium  2 g Topical QID  . doxercalciferol  1.5 mcg  Intravenous Q M,W,F-HD  . feeding supplement (ENSURE ENLIVE)  237 mL Oral BID BM  . insulin aspart  0-9 Units Subcutaneous TID WC  . isosorbide mononitrate  30 mg Oral QPC lunch  . lactulose  20 g Oral TID  . lidocaine  1 patch Transdermal Q24H  . Melatonin  3 mg Oral QHS  . multivitamin  1 tablet Oral QPC lunch  . Muscle Rub   Topical BID  . pantoprazole  40 mg Oral Daily  . saccharomyces boulardii  250 mg Oral BID

## 2019-11-28 NOTE — Progress Notes (Signed)
Speech Language Pathology Daily Session Note  Patient Details  Name: [name] MRN: [id number] Date of Birth: Jan 23, 1951  Today's Date: 11/28/2019 SLP Individual Time: 1000-1055 SLP Individual Time Calculation (min): 55 min  Short Term Goals: Week 4: SLP Short Term Goal 1 (Week 4): Pt will demonstrate functional problem solving during basic tasks with Min A verbal/visual cues SLP Short Term Goal 2 (Week 4): Pt will demonstrate recall of day to day/new information with Min A verbal cues for use of compensatory memory strategies SLP Short Term Goal 3 (Week 4): Pt will detect and correct errors during functional tasks with Min A verbal/visual cues. SLP Short Term Goal 4 (Week 4): Pt will selectively attend to functional tasks with Min A verbal cues for redirection SLP Short Term Goal 5 (Week 4): Pt will demonstrate intellectual awareness by identifying 1 physical and 1 cognitive deficit with Min A verbal cues.  Skilled Therapeutic Interventions: Pt was seen for skilled ST targeting cognitive goals. Although pt perfectly agreeable to participate, she was somewhat limited by nausea throughout session. She reported hesitance to assume fully upright positioning for fear of emesis (which occurred at end of PT session, per pt report). Pt recalled 3 activities of PT session with Supervision A question cues. Min A verbal cues required for pt to verbally recall her PT's name. SLP further facilitated session with a functional conversation regarding pt's current medications. Pt independently recalled 4 out of 10 medication functions, 3 out of 10 names. Verbal review of other medication names, functions, and dosages provided, and after a ~10 minute delay, pt's verbal recall of names/functions increased to 6 out of 10. Pt reported increasing nausea near end of session and did produce some emesis. RN made aware and pt was requesting medication for heartburn. Pt left laying in bed with alarm set and all needs  within reach, son present. Continue per current plan of care.       Pain Pain Assessment Pain Scale: Faces Pain Score: 0-No pain Faces Pain Scale: Hurts a little bit Pain Type: Acute pain Pain Location: (nausea, heartburn pain) Pain Orientation: Right Pain Descriptors / Indicators: Aching Patients Stated Pain Goal: 1 Pain Intervention(s): RN made aware Multiple Pain Sites: No  Therapy/Group: Individual Therapy  Arbutus Leas 11/28/2019, 11:00 AM

## 2019-11-28 NOTE — Progress Notes (Signed)
Patient refusing bowel program medications, patient is made aware that she has not had a bowel movement in 72hours and she still refuses lactulose and anything regarding to help with bowel management. Continue plan of care. Audie Clear, LPN

## 2019-11-29 ENCOUNTER — Inpatient Hospital Stay (HOSPITAL_COMMUNITY): Payer: Medicare PPO

## 2019-11-29 ENCOUNTER — Inpatient Hospital Stay (HOSPITAL_COMMUNITY): Payer: Medicare PPO | Admitting: Speech Pathology

## 2019-11-29 ENCOUNTER — Inpatient Hospital Stay (HOSPITAL_COMMUNITY): Payer: Medicare PPO | Admitting: Occupational Therapy

## 2019-11-29 LAB — RENAL FUNCTION PANEL
Albumin: 1.9 g/dL — ABNORMAL LOW (ref 3.5–5.0)
Anion gap: 14 (ref 5–15)
BUN: 42 mg/dL — ABNORMAL HIGH (ref 8–23)
CO2: 26 mmol/L (ref 22–32)
Calcium: 8.6 mg/dL — ABNORMAL LOW (ref 8.9–10.3)
Chloride: 100 mmol/L (ref 98–111)
Creatinine, Ser: 8.4 mg/dL — ABNORMAL HIGH (ref 0.44–1.00)
GFR calc Af Amer: 5 mL/min — ABNORMAL LOW (ref >60)
GFR calc non Af Amer: 4 mL/min — ABNORMAL LOW (ref >60)
Glucose, Bld: 196 mg/dL — ABNORMAL HIGH (ref 70–99)
Phosphorus: 3.6 mg/dL (ref 2.5–4.6)
Potassium: 4.6 mmol/L (ref 3.5–5.1)
Sodium: 140 mmol/L (ref 135–145)

## 2019-11-29 LAB — CBC
HCT: 30 % — ABNORMAL LOW (ref 36.0–46.0)
Hemoglobin: 9.1 g/dL — ABNORMAL LOW (ref 12.0–15.0)
MCH: 28.3 pg (ref 26.0–34.0)
MCHC: 30.3 g/dL (ref 30.0–36.0)
MCV: 93.5 fL (ref 80.0–100.0)
Platelets: 223 10*3/uL (ref 150–400)
RBC: 3.21 MIL/uL — ABNORMAL LOW (ref 3.87–5.11)
RDW: 19.1 % — ABNORMAL HIGH (ref 11.5–15.5)
WBC: 8.6 10*3/uL (ref 4.0–10.5)
nRBC: 0 % (ref 0.0–0.2)

## 2019-11-29 LAB — GLUCOSE, CAPILLARY
Glucose-Capillary: 137 mg/dL — ABNORMAL HIGH (ref 70–99)
Glucose-Capillary: 108 mg/dL — ABNORMAL HIGH (ref 70–99)
Glucose-Capillary: 156 mg/dL — ABNORMAL HIGH (ref 70–99)
Glucose-Capillary: 148 mg/dL — ABNORMAL HIGH (ref 70–99)

## 2019-11-29 MED ORDER — DOXERCALCIFEROL 4 MCG/2ML IV SOLN
INTRAVENOUS | Status: AC
  Administered 2019-11-29: 1.5 ug via INTRAVENOUS
  Filled 2019-11-29: qty 2

## 2019-11-29 MED ORDER — HEPARIN SODIUM (PORCINE) 1000 UNIT/ML IJ SOLN
INTRAMUSCULAR | Status: AC
  Filled 2019-11-29: qty 4

## 2019-11-29 NOTE — Progress Notes (Signed)
[facility] Progress Note    Assessment/ Plan:   1. Acute CVA with evidence of chronic ischemia/old infarcts:admit Oct 20, 2019, L sided weakness. Seen by neurology-suspected embolic infarct from endocarditis. Ongoing inpatient rehabilitation for management of neurological deficits. Repeat MRI showedno acute changes 2. Pseudomonas MV endocarditis/ bacteremia: HD cath exchanged for infection focus control. Seen by TCTS w/ no plan for surgical intervention.Antiboitic through 12/06/19. 3. Progression L3-4 discitis on MRI. Evaluated by neurosurgery. No indication for surgical intervention. ID has directed ABX and duration. Antibiotics changed to Fortaz/Cipro.  4. R IJ acute DVT: on 3 mos Eliquis (change from coumadin due to difficulties managing INR) 5. ESRD: MWF HD. ESRD x 1 year, tried AVF "6 times" w/o success. TDC dependent. HD today, generally 1L UF but she's had some vomiting in the past few days and BP down with UF so no UF today.  3K bath here. Tight heparin.  6. Anemia:hgb improved to 9s. ESA qMon. tsat 16% 12/15 - Fe held due to being treated for infection 7. 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  8. Hypertension/volume : BP low/stable. No vol^ on exam.  9.Nutrition -diet liberalized to regular - has supplements/multivits ordered - alb very low - not eating much - contributing to weakness 10. Deconditioning -in CIR - afternoon dialysis to help with AM therapy stamina     Subjective:    Seen in HD, no new complaints. No SOB, CP or abd pain.  No fevers, chills.   Objective:   BP (!) 83/45   Pulse 86   Temp 97.9 F (36.6 C) (Oral)   Resp 16   Ht 5\' 7"  (1.702 m)   Wt 60 kg Comment: bed scale  SpO2 99%   BMI 20.72 kg/m   Intake/Output Summary (Last 24 hours) at 11/29/2019 1506 Last data filed at 11/28/2019 1826 Gross per 24 hour  Intake 120 ml  Output --  Net 120 ml   Weight change:   HD Outpatient:  [facility] MWF  3.5h  TDC (mult AVF attempts failed)  62.5kg  2/2.5 bath 400/600 - hect 1.5 ug - EPO 1200u each HD, iron 50mg  weekly  Physical Exam: Gen: NAD, on L side in bed vomiting. CVS: RRR Resp: clear bilaterally Abd: soft nontender NABS Ext: no LE edema  Imaging: No results found.  Labs: BMET Recent Labs  Lab 11/24/19 1231 11/27/19 1625 11/29/19 1317  NA 139 140 140  K 3.6 3.8 4.6  CL 99 98 100  CO2 28 29 26   GLUCOSE 148* 178* 196*  BUN 24* 40* 42*  CREATININE 5.98* 8.15* 8.40*  CALCIUM 8.5* 8.6* 8.6*  PHOS 3.3 3.3 3.6   CBC Recent Labs  Lab 11/24/19 1230 11/27/19 0529 11/27/19 1625 11/29/19 1317  WBC 10.0 8.2 7.8 8.6  NEUTROABS 6.9  --   --   --   HGB 8.5* 9.2* 8.9* 9.1*  HCT 28.4* 30.3* 29.2* 30.0*  MCV 94.0 93.8 92.4 93.5  PLT 255 266 254 223    Medications:    . acetaminophen  500 mg Oral TID  . apixaban  5 mg Oral BID  . calcium carbonate  1 tablet Oral 2 times per day  . carvedilol  12.5 mg Oral BID  . Chlorhexidine Gluconate Cloth  6 each Topical Q0600  . ciprofloxacin  500 mg Oral Q24H  . collagenase   Topical Daily  . darbepoetin (ARANESP) injection - DIALYSIS  100 mcg Intravenous Q Mon-HD  .  diclofenac Sodium  2 g Topical QID  . doxercalciferol  1.5 mcg Intravenous Q M,W,F-HD  . feeding supplement (ENSURE ENLIVE)  237 mL Oral BID BM  . insulin aspart  0-9 Units Subcutaneous TID WC  . isosorbide mononitrate  30 mg Oral QPC lunch  . lactulose  20 g Oral TID  . lidocaine  1 patch Transdermal Q24H  . Melatonin  3 mg Oral QHS  . multivitamin  1 tablet Oral QPC lunch  . Muscle Rub   Topical BID  . pantoprazole  40 mg Oral Daily  . saccharomyces boulardii  250 mg Oral BID

## 2019-11-29 NOTE — Progress Notes (Signed)
Occupational Therapy Session Note  Patient Details  Name: [name] MRN: [id number] Date of Birth: December 05, 1950  Today's Date: 11/29/2019 OT Individual Time: [id number] OT Individual Time Calculation (min): 54 min    Short Term Goals: Week 4:  OT Short Term Goal 1 (Week 4): Pt will tolerate sitting OOB 2 hrs to demonstrate increased activity tolerance OT Short Term Goal 2 (Week 4): Pt will compete bathing with mod assist OT Short Term Goal 3 (Week 4): Pt will complete LB dressing at sit > stand level with min assist OT Short Term Goal 4 (Week 4): Pt will don socks and shoes with min assist OT Short Term Goal 5 (Week 4): Pt will complete toilet transfer with min assist  Skilled Therapeutic Interventions/Progress Updates:    Treatment session with focus on functional transfers, sit > stand, and increased independence with self-care tasks.  Upon arrival, pt having just been transferred to w/c needing to toilet.  Completed squat pivot transfer w/c > toilet with grab bar with mod assist.  Required mod assist sit > stand from toilet, followed by min assist short distance ambulation to shower seat in room shower.  Pt completed bathing with use of long handled sponge to wash lower legs and feet.  Pt pushed up in to partial standing with heavy support on grab bars to allow therapist to wash buttocks.  Squat pivot transfer to w/c out of shower Mod assist.  Completed UB dressing seated in w/c, then completed LB dressing at bed level.  RN arrived to change dressing on buttocks at bed level.  Pt remained in sidelying at the end of session reporting fatigue, but pleased with accomplishments this session.  Therapy Documentation Precautions:  Precautions Precautions: Fall Precaution Comments: Strong L lateral lean, TLSO for mobility Required Braces or Orthoses: Other Brace(TLSO) Spinal Brace: Thoracolumbosacral orthotic Spinal Brace Comments: for comfort with mobility Restrictions Weight Bearing  Restrictions: No Pain: Pain Assessment Pain Scale: Faces Pain Score: 3  Faces Pain Scale: Hurts a little bit Pain Type: Acute pain Pain Location: Sacrum Pain Orientation: Lower Pain Descriptors / Indicators: Discomfort Pain Onset: On-going Pain Intervention(s): Repositioned;Distraction Multiple Pain Sites: No   Therapy/Group: Individual Therapy  [name] 11/29/2019, 10:33 AM

## 2019-11-29 NOTE — Progress Notes (Addendum)
[facility] PHYSICAL MEDICINE & REHABILITATION PROGRESS NOTE   Subjective/Complaints: Patient seen laying in bed this AM, working with therapies.  She states she slept well overnight, confirmed with sleep chart. She was seen by CTS and Nephro yesterday, notes reviewed.   ROS: Denies CP, SOB, N/V/D  Objective:   No results found. Recent Labs    11/27/19 0529 11/27/19 1625  WBC 8.2 7.8  HGB 9.2* 8.9*  HCT 30.3* 29.2*  PLT 266 254   Recent Labs    11/27/19 1625  NA 140  K 3.8  CL 98  CO2 29  GLUCOSE 178*  BUN 40*  CREATININE 8.15*  CALCIUM 8.6*    Intake/Output Summary (Last 24 hours) at 11/29/2019 0833 Last data filed at 11/28/2019 1826 Gross per 24 hour  Intake 240 ml  Output 100 ml  Net 140 ml     Physical Exam: Vital Signs Blood pressure 102/64, pulse 86, temperature 98.1 F (36.7 C), temperature source Oral, resp. rate 17, height 5\' 7"  (1.702 m), weight 60 kg, SpO2 100 %.  Constitutional: No distress . Vital signs reviewed. HENT: Normocephalic.  Atraumatic. Eyes: EOMI. No discharge. Cardiovascular: No JVD. Respiratory: Normal effort.  No stridor. GI: Non-distended. Skin: Warm and dry.  Intact. Psych: Normal mood.  Normal behavior. Musc: No edema in extremities.  No tenderness in extremities. Neuro: Alert and oriented x3 Motor:  RUE: 5/5 proximal to distal RLE: HF, KE 4+/5, ADF 4+/5 (pain inhibition), stable LUE: 4+/5 proximal to distal LLE: HF, KE 4-/5, ADF 4+/5 (pain inhibition)  Assessment/Plan: 1. Functional deficits secondary to right basal ganglia stroke which require 3+ hours per day of interdisciplinary therapy in a comprehensive inpatient rehab setting.  Physiatrist is providing close team supervision and 24 hour management of active medical problems listed below.  Physiatrist and rehab team continue to assess barriers to discharge/monitor patient progress toward functional and medical goals  Care Tool:  Bathing    Body parts bathed by  patient: Right arm, Left arm, Chest, Abdomen, Front perineal area, Right upper leg, Left upper leg, Face, Right lower leg, Left lower leg   Body parts bathed by helper: Buttocks     Bathing assist Assist Level: Minimal Assistance - Patient > 75%     Upper Body Dressing/Undressing Upper body dressing   What is the patient wearing?: Orthosis, Pull over shirt    Upper body assist Assist Level: Minimal Assistance - Patient > 75%    Lower Body Dressing/Undressing Lower body dressing      What is the patient wearing?: Pants     Lower body assist Assist for lower body dressing: Minimal Assistance - Patient > 75%     Toileting Toileting    Toileting assist Assist for toileting: Maximal Assistance - Patient 25 - 49%     Transfers Chair/bed transfer  Transfers assist     Chair/bed transfer assist level: Moderate Assistance - Patient 50 - 74% Chair/bed transfer assistive device: Programmer, multimedia   Ambulation assist   Ambulation activity did not occur: Safety/medical concerns(back pain, bilateral LE weakness, poor bilateral knee control)  Assist level: Minimal Assistance - Patient > 75% Assistive device: Walker-rolling Max distance: 74ft   Walk 10 feet activity   Assist  Walk 10 feet activity did not occur: Safety/medical concerns(back pain, bilateral LE weakness, poor bilateral knee control)  Assist level: Minimal Assistance - Patient > 75% Assistive device: Walker-rolling   Walk 50 feet activity   Assist Walk 50 feet with  2 turns activity did not occur: Safety/medical concerns(back pain, bilateral LE weakness, poor bilateral knee control)  Assist level: Minimal Assistance - Patient > 75% Assistive device: Walker-rolling    Walk 150 feet activity   Assist Walk 150 feet activity did not occur: Safety/medical concerns(back pain, bilateral LE weakness, poor bilateral knee control)         Walk 10 feet on uneven surface   activity   Assist Walk 10 feet on uneven surfaces activity did not occur: Safety/medical concerns(back pain, bilateral LE weakness, poor bilateral knee control)   Assist level: Minimal Assistance - Patient > 75% Assistive device: Aeronautical engineer Will patient use wheelchair at discharge?: Yes Type of Wheelchair: Manual Wheelchair activity did not occur: Safety/medical concerns(Pt could not tolerate sitting in WC for long enough to complete activity due to low back pain)  Wheelchair assist level: Supervision/Verbal cueing Max wheelchair distance: 20ft    Wheelchair 50 feet with 2 turns activity    Assist    Wheelchair 50 feet with 2 turns activity did not occur: Safety/medical concerns(Pt could not tolerate sitting in WC for long enough to complete activity due to low back pain)   Assist Level: Supervision/Verbal cueing   Wheelchair 150 feet activity     Assist  Wheelchair 150 feet activity did not occur: Safety/medical concerns(Pt could not tolerate sitting in WC for long enough to complete activity due to low back pain)       Blood pressure 102/64, pulse 86, temperature 98.1 F (36.7 C), temperature source Oral, resp. rate 17, height 5\' 7"  (1.702 m), weight 60 kg, SpO2 100 %.    Medical Problem List and Plan: 1.  Impaired gait with left side weakness secondary to right basal ganglia infarction as well as small cortical infarct left frontal lobe  CT head on 12/13 personally reviewed, showing some improvement, MRI personally reviewed, stable. Repeat MRI ordered again by ID, showing stable infarcts  Chest x-ray personally reviewed, unremarkable for infection  Discussed with neurology results of the EEG -findings possibly related to uremia +/- cefepime  Discussed with psychiatry, continue medical management  Discussed with hospitalist, appreciate recs, discussed with hospitalist again on 12/25  Continue CIR  Team conference today to discuss  current and goals and coordination of care, home and environmental barriers, and discharge planning with nursing, case manager, and therapies.  2.  Antithrombotics: -DVT/anticoagulation: Right IJ acute DVT.  Plan anticoagulation x3 months  Transitioned to Eliquis as it has been very difficult to manage INR    Repeat U/S suggesting chronic DVT             -antiplatelet therapy: N/A 3. Pain Management chronic back and left hip pain: Neurontin 100 mg on hold  TLSO back brace for comfort  Lidocaine patch added to coccyx  Voltaren gel added for left greater trochanteric area on 1/1 with improvement 4. Mood: Provide emotional support             -antipsychotic agents: N/A 5. Neuropsych: This patient is not capable of making decisions on her own behalf. 6. Skin/Wound Care: Routine skin checks  Santyl to sacral wound - WOC notes reviewed 7. Fluids/Electrolytes/Nutrition: Routine in and outs 8.  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  New HD tunneled catheter placed 10/26/2019.  Repeat echo showing increase in mitral regurg  CT surgery recommending mitral valve replacement, not recommending surgical intervention at present and recommends medical  management- will follow as outpatient. 9.  ID/discitis/recurrent Pseudomonas bacteremia.    Cipro/Maxipime changed to ceftazidime/Cipro through 12/05/2018.  Seen by neurosurgery on 12/25, no surgical intervention recommended at this point, however recommended following ID recs  Appreciate ID recs  10.  Hemodialysis MWF.  Follow-up per renal services.  11.  Diabetes mellitus.  Hemoglobin A1c 7.6.  SSI. CBG (last 3)  Recent Labs    11/28/19 1627 11/28/19 2104 11/29/19 0602  GLUCAP 149* 167* 137*   Labile on 1/6 12.  Hypertension.  Imdur 30 mg daily  Coreg 6.25 mg twice daily, increased to 12.5 on 12/15, parameters placed.    Monitor with increased mobility.  Soft, but  asymptomatic on 1/6 13.  Acute on chronic anemia.    Hemoglobin 8.9 on 1/4  Continue to monitor 14. Constipation:  See #16  Abd xray showing?  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  Candidiasis  Diflucan ordered on 12/16  Repeat Ucx not collected yet on 1/4 16.  Hyperammonemia with hepatic encephalopathy  Lactulose started on 12/17, encourage compliance  Ammonia WNL on 12/22 17. Sleep disturance  Melatonin added on 12/17  Sleep chart ordered  Improving 18.  Leukocytosis: Resolved  Afebrile  Continue to monitor 19.  GERD  Home Protonix ordered on 12/20 20.  Pneumatosis Coli  CT abdomen/pelvis on 12/24?  Obese.  Appreciate surgery recs-recommending GI recs, no surgical intervention, signed off  Appreciate GI recs, advising against colonoscopy and recommending conservative care at present with repeat imaging in 1-3 months.  LOS: 28 days A FACE TO FACE EVALUATION WAS PERFORMED  Antanette Richwine Lorie Phenix 11/29/2019, 8:33 AM

## 2019-11-29 NOTE — Progress Notes (Signed)
Physical Therapy Session Note  Patient Details  Name: [name] MRN: [id number] Date of Birth: 11/24/1950  Today's Date: 11/29/2019 PT Individual Time: 1030-1125 PT Individual Time Calculation (min): 55 min   Short Term Goals: Week 3:  PT Short Term Goal 1 (Week 3): Pt will transfer supine<>sit and sit<>supine with min A PT Short Term Goal 1 - Progress (Week 3): Met PT Short Term Goal 2 (Week 3): Pt will transfer stand<>pivot with LRAD mod A PT Short Term Goal 2 - Progress (Week 3): Not progressing PT Short Term Goal 3 (Week 3): Pt will perform car transfer with LRAD mod A PT Short Term Goal 3 - Progress (Week 3): Not progressing Week 4:  PT Short Term Goal 1 (Week 4): Pt will transfer supine<>sit and sit<>supine with CGA PT Short Term Goal 1 - Progress (Week 4): Met PT Short Term Goal 2 (Week 4): Pt will transfer stand<>pivot with LRAD mod A PT Short Term Goal 2 - Progress (Week 4): Met PT Short Term Goal 3 (Week 4): Pt will perform car transfer with LRAD mod A PT Short Term Goal 3 - Progress (Week 4): Progressing toward goal  Skilled Therapeutic Interventions/Progress Updates:   Received pt supine in bed, pt agreeable to therapy, and did not sate pain level. Session focused on functional mobility/transfers, ambulation with RW, LE strength, balance, and improved activity tolerance. Pt performed bed mobility with supervision. Therapist donned socks and shoes max A for time management purposes. Pt transferred stand<>pivot bed<>WC with RW min A. Pt performed WC mobility 50ft with bilateral UE and supervision. Pt ambulated 162ft with RW min A. Pt demonstrated flexed trunk, narrow BOS, and decreased bilateral stride length. Pt ambulated 10ft with RW min A to Nustep.  Pt performed bilateral UE and LE strengthening on Nustep 3 minutes x 2 trials at workload 4 with pillow under buttocks to relieve pressure on tailbone for total of 182 steps. Pt requested urge to have BM and was transported  back to room in WC total assist. Pt attempted to ambulate 10ft with RW to bathroom with RW however when crossing over incline threshold to get into bathroom LLE knee buckled and pt was gently assisted down to floor. Pt declined and injury and stated "the only thing that is hurt is my pride". Pt transferred floor<>WC +2 assist. Pt transferred scoot<>pivot WC<>bed min A and sit<>supine with supervision. Pt doffed pants and incontinence brief with mod A. Pt required total assist for peri-care and mod A to don clean incontinence brief and pants. Concluded session with pt supine in bed, needs within reach, and bed alarm on. RN aware and planning to assess vitals.   Therapy Documentation Precautions:  Precautions Precautions: Fall Precaution Comments: Strong L lateral lean, TLSO for mobility Required Braces or Orthoses: Other Brace(TLSO) Spinal Brace: Thoracolumbosacral orthotic Spinal Brace Comments: for comfort with mobility Restrictions Weight Bearing Restrictions: No   Therapy/Group: Individual Therapy  M     PT, DPT   11/29/2019, 7:39 AM  

## 2019-11-29 NOTE — Patient Care Conference (Signed)
Inpatient RehabilitationTeam Conference and Plan of Care Update Date: 11/29/2019   Time: 11:40 AM    Patient Name: [name]      Medical Record Number: [id number]  Date of Birth: 1951-03-01 Sex: Female         Room/Bed: 4W12C/4W12C-01 Payor Info: Payor: [employer] MEDICARE / Plan: [employer] MEDICARE CHOICE PPO / Product Type: *No Product type* /    Admit Date/Time:  11/01/2019  5:11 PM  Primary Diagnosis:  Cerebrovascular accident (CVA) of right basal ganglia [facility])  Patient Active Problem List   Diagnosis Date Noted  . Acute deep vein thrombosis (DVT) of non-extremity vein   . Greater trochanteric bursitis of left hip   . Subtherapeutic international normalized ratio (INR)   . Pneumatosis coli   . Ogilvie's syndrome   . Decubitus ulcer of sacral region, unstageable ([place])   . Hemodialysis-associated hypotension   . Supratherapeutic INR   . Gastroesophageal reflux disease   . Encephalopathy, hepatic ([place])   . Hyperammonemia ([place])   . Sleep disturbance   . Candidiasis   . Endocarditis of mitral valve   . Septic embolism ([place])   . Seizure ([place])   . Slow transit constipation   . Acute on chronic anemia   . Labile blood pressure   . Labile blood glucose   . Diabetes mellitus type 2 in nonobese ([facility])   . Bacteremia   . Cerebrovascular accident (CVA) of right basal ganglia ([place]) 11/01/2019  . Acute bacterial endocarditis   . Acute left-sided weakness   . Acute ischemic stroke ([place]) 10/20/2019  . Hyperkalemia 09/10/2019  . End-stage renal disease on hemodialysis ([place])   . Nausea vomiting and diarrhea   . Bacteremia due to Pseudomonas 07/20/2019  . ESRD needing dialysis ([place]) 05/15/2019  . Anemia of chronic renal failure 04/03/2018  . Essential hypertension 04/03/2018  . Type 2 diabetes mellitus with diabetic nephropathy ([place]) 04/03/2018    Expected Discharge Date: Expected Discharge Date: 12/05/19  Team Members Present: Physician leading conference: Dr. Delice Lesch Social  Worker Present: Lennart Pall, LCSW Nurse Present: Dorthula Nettles, RN Case Manager: Karene Fry, RN PT Present: Becky Sax, PT OT Present: [name], OT SLP Present: Jettie Booze, CF-SLP PPS Coordinator present : Ileana Ladd, Burna Mortimer, SLP     Current Status/Progress Goal Weekly Team Focus  Bowel/Bladder   HD patient anuric. last noted BM 1/2, continues to refuse lactulose  maintain continence, continue HD X3 weekly.  assess toileting needs qshift and PRN   Swallow/Nutrition/ Hydration             ADL's   Min A bathing at shower level, UB dressing seated upright, LB dressing at bed level min assist.  Transfers Mod A squat pivot, Min A stand pivot with RW, Cotninues to require Mod assist sit > stand  Min assist overall  ADL retraining, transfers, pt/family education, d/c planning   Mobility   bed mobility supervision, bed<>chair transfers mod A, gait 78ft with RW min A, 4 steps with 2 rails mod A, WC mobility 50ft supervision.  min A  functional mobility/transfers, ambulation, stair navigation, LE strength, balance, endurance   Communication             Safety/Cognition/ Behavioral Observations  Min A  Supervision A  semi-complex problem solving, short term recall, use of compensatory memory strategies   Pain   c/o pain to bottom reileved with scheduled tylenol  <3  assess pain qshift and PRN   Skin   Unstageable  pressure injury to sacrum. Foam dressing in place.  Improvement of sacral wound no further skin breakdown. keep are fee of infection  assess skin qshift and PRN    Rehab Goals Patient on target to meet rehab goals: Yes *See Care Plan and progress notes for long and short-term goals.     Barriers to Discharge  Current Status/Progress Possible Resolutions Date Resolved   Nursing                  PT  Medical stability                 OT                  SLP                SW                Discharge Planning/Teaching Needs:  Home with son who will  provide 24/7 assistance.      Team Discussion: On abx one more week, transitioned coumadin to Eloquis.  RN sacral wound drsg changes, better this week, had assisted fall with therapy, took meds, son brings in food.  OT mod squat pivot, mod sit to stand, min a bathing, min a goals, motivated to go home.  PT mod stand, amb 162' min A, L knee buckled going to bathroom, has assisted fall, no injury.  SLP min/S recall.  Need to do fam ed with son next week.   Revisions to Treatment Plan: N/A     Medical Summary Current Status: Impaired gait with left side weakness secondary to right basal ganglia infarction as well as small cortical infarct left frontal lobe Weekly Focus/Goal: Improve mobility, bactermia  Barriers to Discharge: Hemodialysis;Medical stability;IV antibiotics   Possible Resolutions to Barriers: Therapies, IV abx   Continued Need for Acute Rehabilitation Level of Care: The patient requires daily medical management by a physician with specialized training in physical medicine and rehabilitation for the following reasons: Direction of a multidisciplinary physical rehabilitation program to maximize functional independence : Yes Medical management of patient stability for increased activity during participation in an intensive rehabilitation regime.: Yes Analysis of laboratory values and/or radiology reports with any subsequent need for medication adjustment and/or medical intervention. : Yes   I attest that I was present, lead the team conference, and concur with the assessment and plan of the team.   [name] 12/01/2019, 9:57 AM   Team conference was held via web/ teleconference due to [place] - 19

## 2019-11-29 NOTE — Progress Notes (Addendum)
Speech Language Pathology Daily Session Note  Patient Details  Name: [name] MRN: [id number] Date of Birth: 1951-03-08  Today's Date: 11/29/2019 SLP Individual Time: 0730-0828 SLP Individual Time Calculation (min): 58 min  Short Term Goals: Week 4: SLP Short Term Goal 1 (Week 4): Pt will demonstrate functional problem solving during basic tasks with Min A verbal/visual cues SLP Short Term Goal 2 (Week 4): Pt will demonstrate recall of day to day/new information with Min A verbal cues for use of compensatory memory strategies SLP Short Term Goal 3 (Week 4): Pt will detect and correct errors during functional tasks with Min A verbal/visual cues. SLP Short Term Goal 4 (Week 4): Pt will selectively attend to functional tasks with Min A verbal cues for redirection SLP Short Term Goal 5 (Week 4): Pt will demonstrate intellectual awareness by identifying 1 physical and 1 cognitive deficit with Min A verbal cues.  Skilled Therapeutic Interventions: Pt was seen for skilled ST targeting cognitive goals. Pt appeared noticeably brighter in affect and very motivated to participate in therapies today.  She recalled 6/10 current medications names and/or functions with Supervision A question cues, demonstrating good carryover of yesterday's session. During a novel monthly scheduling task, SLP provided overall Min A verbal/visual cues for error awareness, decreased Supervision A for problem solving and organization. Pt required Min A verbal cues for recall of rules and problem solving throughout a familiar card task (blink). Of note, pt also selectively attended to tasks throughout session Mod I. She even alternated attention between conversation and calendar task for 1 brief interval of time (~5 minutes). Pt was left side lying in bed with alarm set and needs within reach. Continue per current plan of care.        Pain Pain Assessment Pain Scale: Faces Faces Pain Scale: Hurts a little bit Pain Type:  Acute pain Pain Location: Sacrum Pain Orientation: Lower Pain Descriptors / Indicators: Discomfort Pain Onset: On-going Pain Intervention(s): Repositioned;Distraction Multiple Pain Sites: No  Therapy/Group: Individual Therapy  Arbutus Leas 11/29/2019, 10:04 AM

## 2019-11-29 NOTE — Progress Notes (Signed)
11/29/19 1130  What Happened  Was fall witnessed? Yes  Who witnessed fall? Becky Sax, PT  Patients activity before fall ambulating-assisted  Point of contact buttocks  Was patient injured? No  Follow Up  MD notified Delice Lesch, MD  Time MD notified [phone]  Family notified No - patient refusal  Additional tests No  Progress note created (see row info) Yes  Adult Fall Risk Assessment  Risk Factor Category (scoring not indicated) Fall has occurred during this admission (document High fall risk)  Age 69  Fall History: Fall within 6 months prior to admission 0  Elimination; Bowel and/or Urine Incontinence 2  Elimination; Bowel and/or Urine Urgency/Frequency 0  Medications: includes PCA/Opiates, Anti-convulsants, Anti-hypertensives, Diuretics, Hypnotics, Laxatives, Sedatives, and Psychotropics 5  Patient Care Equipment 0  Mobility-Assistance 2  Mobility-Gait 2  Mobility-Sensory Deficit 0  Altered awareness of immediate physical environment 0  Impulsiveness 0  Lack of understanding of one's physical/cognitive limitations 4 (Knee can buckle)  Total Score 16  Patient Fall Risk Level High fall risk  Adult Fall Risk Interventions  Required Bundle Interventions *See Row Information* High fall risk - low, moderate, and high requirements implemented  Additional Interventions Family Supervision;Room near nurses station;Use of appropriate toileting equipment (bedpan, BSC, etc.)  Screening for Fall Injury Risk (To be completed on HIGH fall risk patients) - Assessing Need for Low Bed  Risk For Fall Injury- Low Bed Criteria Previous fall this admission (already in low bed)  Will Implement Low Bed and Floor Mats Yes  Screening for Fall Injury Risk (To be completed on HIGH fall risk patients who do not meet crieteria for Low Bed) - Assessing Need for Floor Mats Only  Risk For Fall Injury- Criteria for Floor Mats Bleeding risk-anticoagulation (not prophylaxis)  Will Implement Floor Mats Yes   Vitals  Temp 98.1 F (36.7 C)  Temp Source Oral  BP 100/70  BP Location Left Arm  BP Method Automatic  Patient Position (if appropriate) Lying  Pulse Rate 86  Pulse Rate Source Dinamap  Resp 18  Oxygen Therapy  SpO2 100 %  O2 Device Room Air  Pain Assessment  Pain Scale 0-10  Pain Score 0  Neurological  Neuro (WDL) WDL  Level of Consciousness Alert  Orientation Level Oriented X4  Cognition Follows commands  Speech Clear  R Hand Grip Moderate  L Hand Grip Moderate   R Foot Dorsiflexion Weak  L Foot Dorsiflexion Weak  R Foot Plantar Flexion Weak  L Foot Plantar Flexion Weak  RUE Motor Response Purposeful movement  RUE Sensation Full sensation  RUE Motor Strength 4  LUE Motor Response Purposeful movement  LUE Sensation Full sensation  LUE Motor Strength 4  RLE Motor Response Purposeful movement  RLE Sensation Full sensation  RLE Motor Strength 3  LLE Motor Response Purposeful movement  LLE Sensation Full sensation  LLE Motor Strength 4  Neuro Symptoms Forgetful  Musculoskeletal  Musculoskeletal (WDL) X  Assistive Device Wheelchair;Front wheel walker  Generalized Weakness Yes  Weight Bearing Restrictions No  Musculoskeletal Details  Lower Back Limited movement  Lower Back Ortho/Supportive Device Back Brace  Back Brace TLSO;On and aligned  Integumentary  Integumentary (WDL) X  Skin Color Appropriate for ethnicity  Skin Condition Dry  Skin Integrity Catheter entry/exit site  Catheter Entry/Exit Location Chest  Catheter Entry/Exit Location Orientation Right  Catheter Entry/Exit Intervention Other (Comment) (assessed)  Skin Turgor Non-tenting   Patient stated to Becky Sax, PT that her knee buckled as they  were ambulating into the bathroom so the PT gently lowered the patient to the floor. The patient sustained no injuries. Dr. Delice Lesch was made aware during team conference by this RN and Becky Sax, PT.

## 2019-11-30 ENCOUNTER — Inpatient Hospital Stay (HOSPITAL_COMMUNITY): Payer: Medicare PPO | Admitting: Speech Pathology

## 2019-11-30 ENCOUNTER — Inpatient Hospital Stay (HOSPITAL_COMMUNITY): Payer: Medicare PPO | Admitting: Occupational Therapy

## 2019-11-30 ENCOUNTER — Inpatient Hospital Stay (HOSPITAL_COMMUNITY): Payer: Medicare PPO

## 2019-11-30 LAB — GLUCOSE, CAPILLARY
Glucose-Capillary: 110 mg/dL — ABNORMAL HIGH (ref 70–99)
Glucose-Capillary: 153 mg/dL — ABNORMAL HIGH (ref 70–99)
Glucose-Capillary: 116 mg/dL — ABNORMAL HIGH (ref 70–99)
Glucose-Capillary: 145 mg/dL — ABNORMAL HIGH (ref 70–99)

## 2019-11-30 NOTE — Progress Notes (Signed)
Speech Language Pathology Weekly Progress and Session Note  Patient Details  Name: [name] MRN: [id number] Date of Birth: 03/02/1951  Beginning of progress report period: November 23, 2019 End of progress report period: November 30, 2019  Today's Date: 11/30/2019 SLP Individual Time: 7793-9030 SLP Individual Time Calculation (min): 58 min  Short Term Goals: Week 4: SLP Short Term Goal 1 (Week 4): Pt will demonstrate functional problem solving during basic tasks with Min A verbal/visual cues SLP Short Term Goal 1 - Progress (Week 4): Met SLP Short Term Goal 2 (Week 4): Pt will demonstrate recall of day to day/new information with Min A verbal cues for use of compensatory memory strategies SLP Short Term Goal 2 - Progress (Week 4): Met SLP Short Term Goal 3 (Week 4): Pt will detect and correct errors during functional tasks with Min A verbal/visual cues. SLP Short Term Goal 3 - Progress (Week 4): Met SLP Short Term Goal 4 (Week 4): Pt will selectively attend to functional tasks with Min A verbal cues for redirection SLP Short Term Goal 4 - Progress (Week 4): Met SLP Short Term Goal 5 (Week 4): Pt will demonstrate intellectual awareness by identifying 1 physical and 1 cognitive deficit with Min A verbal cues. SLP Short Term Goal 5 - Progress (Week 4): Met    New Short Term Goals: Week 5: SLP Short Term Goal 1 (Week 5): STG=LTG due to remaining LOS  Weekly Progress Updates: Pt has made excellent functional gains and met 5 out of 5 short term goals this reporting period. Pt is currently ~Min assist due to to cognitive impairments primarily only impacting her short term memory, problem solving, and organization now.Pt has demonstrated improved selective attention and intellectual and anticipatory awareness in functional conversations regarding deficits and d/c plans. Pt education is ongoing and education with pt's family is tentatively schedule for next week. Pt would continue to benefit  from skilled ST while inpatient in order to maximize functional independence and reduce burden of care prior to discharge. Anticipate that pt will need 24/7 supervision at discharge, however she is unlikely to need follow up ST at this time.      Intensity: Minumum of 1-2 x/day, 30 to 90 minutes Frequency: Total of 15 hours over 7 days of combined therapies Duration/Length of Stay: 12/06/18 Treatment/Interventions: Functional tasks;Cueing hierarchy;Environmental controls;Internal/external aids;Patient/family education;Cognitive remediation/compensation   Daily Session  Skilled Therapeutic Interventions: Pt was seen for skilled ST targeting cognitive skills. In functional conversation pt recalled 7/10 medication names (some approx.) and functions with Supervision A verbal cues. Min A verbal cues require for recall of remaining 3. Pt's retention of medication names/functions continues to increase each day, carryover is evident. Pt also able to identify 1 physical deficits since stroke independently, Min A verbal cues required to identify memory as cognitive deficits we have targeted in ST. She was able to anticipate "making sure I take my medicines" as a specific task she would need assistance with upon return home, related to her ST memory impairment. SLP further facilitated session with Min-Mod A verbal/visual cues for organization, A for error awareness and problem solving during a semi-complex checkbook balancing task. Pt able to self-monitor need for use of calculator as needed. Pt left laying in bed with alarm set and all needs within reach. Continue per current plan of care.           Pain Pain Assessment Pain Scale: Faces Faces Pain Scale: No hurt Pain Type: Acute pain Pain Location: Sacrum  Pain Orientation: Medial;Lower Pain Descriptors / Indicators: Discomfort;Burning Pain Onset: On-going Pain Intervention(s): Repositioned Multiple Pain Sites: No  Therapy/Group: Individual  Therapy  Arbutus Leas 11/30/2019, 11:54 AM

## 2019-11-30 NOTE — Progress Notes (Signed)
[facility] Progress Note    Assessment/ Plan:   1. Acute CVA with evidence of chronic ischemia/old infarcts:admit Oct 20, 2019, L sided weakness. Seen by neurology-suspected embolic infarct from endocarditis. Ongoing inpatient rehabilitation for management of neurological deficits. Repeat MRI showedno acute changes 2. Pseudomonas MV endocarditis/ bacteremia: HD cath exchanged for infection focus control. Seen by TCTS w/ no plan for surgical intervention.Antiboitic through 12/06/19. 3. Progression L3-4 discitis on MRI. Evaluated by neurosurgery. No indication for surgical intervention. ID has directed ABX and duration. Antibiotics changed to Fortaz/Cipro.  4. R IJ acute DVT: on 3 mos Eliquis (change from coumadin due to difficulties managing INR) 5. ESRD: MWF HD. ESRD x 1 year, tried AVF "6 times" w/o success. TDC dependent. HD tomorrow, generally 1L UF.  3K bath here. Tight heparin.  6. Anemia:hgb improved to 9s. ESA qMon. tsat 16% 12/15 - Fe held due to being treated for infection 7. 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  8. Hypertension/volume : BP low/stable. No vol^ on exam.  9.Nutrition -diet liberalized to regular - has supplements/multivits ordered - alb very low - not eating much - contributing to weakness 10. Deconditioning -in CIR - afternoon dialysis to help with AM therapy stamina     Subjective:    Seen in room, no new complaints. No SOB, CP or abd pain.  No fevers, chills.   Objective:   BP 93/60 (BP Location: Left Arm)   Pulse 82   Temp 98.3 F (36.8 C)   Resp 17   Ht 5\' 7"  (1.702 m)   Wt 60 kg Comment: bed scale  SpO2 97%   BMI 20.72 kg/m   Intake/Output Summary (Last 24 hours) at 11/30/2019 1106 Last data filed at 11/30/2019 0802 Gross per 24 hour  Intake 1240 ml  Output -100 ml  Net 1340 ml   Weight change:   HD Outpatient: [facility] MWF  3.5h  TDC (mult AVF attempts failed)  62.5kg   2/2.5 bath 400/600 - hect 1.5 ug - EPO 1200u each HD, iron 50mg  weekly  Physical Exam: Gen: NAD, on L side in bed vomiting. CVS: RRR Resp: clear bilaterally Abd: soft nontender NABS Ext: no LE edema  Imaging: No results found.  Labs: BMET Recent Labs  Lab 11/24/19 1231 11/27/19 1625 11/29/19 1317  NA 139 140 140  K 3.6 3.8 4.6  CL 99 98 100  CO2 28 29 26   GLUCOSE 148* 178* 196*  BUN 24* 40* 42*  CREATININE 5.98* 8.15* 8.40*  CALCIUM 8.5* 8.6* 8.6*  PHOS 3.3 3.3 3.6   CBC Recent Labs  Lab 11/24/19 1230 11/27/19 0529 11/27/19 1625 11/29/19 1317  WBC 10.0 8.2 7.8 8.6  NEUTROABS 6.9  --   --   --   HGB 8.5* 9.2* 8.9* 9.1*  HCT 28.4* 30.3* 29.2* 30.0*  MCV 94.0 93.8 92.4 93.5  PLT 255 266 254 223    Medications:    . acetaminophen  500 mg Oral TID  . apixaban  5 mg Oral BID  . calcium carbonate  1 tablet Oral 2 times per day  . carvedilol  12.5 mg Oral BID  . Chlorhexidine Gluconate Cloth  6 each Topical Q0600  . ciprofloxacin  500 mg Oral Q24H  . collagenase   Topical Daily  . darbepoetin (ARANESP) injection - DIALYSIS  100 mcg Intravenous Q Mon-HD  . diclofenac Sodium  2 g Topical QID  . doxercalciferol  1.5 mcg Intravenous  Q M,W,F-HD  . feeding supplement (ENSURE ENLIVE)  237 mL Oral BID BM  . insulin aspart  0-9 Units Subcutaneous TID WC  . isosorbide mononitrate  30 mg Oral QPC lunch  . lactulose  20 g Oral TID  . lidocaine  1 patch Transdermal Q24H  . Melatonin  3 mg Oral QHS  . multivitamin  1 tablet Oral QPC lunch  . Muscle Rub   Topical BID  . pantoprazole  40 mg Oral Daily  . saccharomyces boulardii  250 mg Oral BID

## 2019-11-30 NOTE — Progress Notes (Signed)
[facility] PHYSICAL MEDICINE & REHABILITATION PROGRESS NOTE   Subjective/Complaints: Patient seen laying in bed this morning.  She states she slept fairly overnight because she was cold and did not get blankets until late.  She is questions about being cold during dialysis.  She was seen by nephro yesterday, notes reviewed.  ROS: Denies CP, SOB, N/V/D  Objective:   No results found. Recent Labs    11/27/19 1625 11/29/19 1317  WBC 7.8 8.6  HGB 8.9* 9.1*  HCT 29.2* 30.0*  PLT 254 223   Recent Labs    11/27/19 1625 11/29/19 1317  NA 140 140  K 3.8 4.6  CL 98 100  CO2 29 26  GLUCOSE 178* 196*  BUN 40* 42*  CREATININE 8.15* 8.40*  CALCIUM 8.6* 8.6*    Intake/Output Summary (Last 24 hours) at 11/30/2019 0838 Last data filed at 11/30/2019 0802 Gross per 24 hour  Intake 1240 ml  Output -100 ml  Net 1340 ml     Physical Exam: Vital Signs Blood pressure 93/60, pulse 82, temperature 98.3 F (36.8 C), resp. rate 17, height 5\' 7"  (1.702 m), weight 60 kg, SpO2 97 %.  Constitutional: No distress . Vital signs reviewed. HENT: Normocephalic.  Atraumatic. Eyes: EOMI. No discharge. Cardiovascular: No JVD. Respiratory: Normal effort.  No stridor. GI: Non-distended. Skin: Warm and dry.  Intact. Psych: Normal mood.  Normal behavior. Musc: No edema in extremities.  No tenderness in extremities. Neuro: Alert and oriented x3 Motor:  RUE: 5/5 proximal to distal RLE: HF, KE 4+/5, ADF 4+/5 (pain inhibition), stable LUE: 4+/5 proximal to distal LLE: HF, KE 4--4/5, ADF 4+/5 (pain inhibition)  Assessment/Plan: 1. Functional deficits secondary to right basal ganglia stroke which require 3+ hours per day of interdisciplinary therapy in a comprehensive inpatient rehab setting.  Physiatrist is providing close team supervision and 24 hour management of active medical problems listed below.  Physiatrist and rehab team continue to assess barriers to discharge/monitor patient progress toward  functional and medical goals  Care Tool:  Bathing    Body parts bathed by patient: Right arm, Left arm, Chest, Abdomen, Front perineal area, Right upper leg, Left upper leg, Face, Right lower leg, Left lower leg   Body parts bathed by helper: Buttocks     Bathing assist Assist Level: Minimal Assistance - Patient > 75%     Upper Body Dressing/Undressing Upper body dressing   What is the patient wearing?: Pull over shirt    Upper body assist Assist Level: Supervision/Verbal cueing    Lower Body Dressing/Undressing Lower body dressing      What is the patient wearing?: Pants     Lower body assist Assist for lower body dressing: Minimal Assistance - Patient > 75%     Toileting Toileting    Toileting assist Assist for toileting: Maximal Assistance - Patient 25 - 49%     Transfers Chair/bed transfer  Transfers assist     Chair/bed transfer assist level: Moderate Assistance - Patient 50 - 74% Chair/bed transfer assistive device: Programmer, multimedia   Ambulation assist   Ambulation activity did not occur: Safety/medical concerns(back pain, bilateral LE weakness, poor bilateral knee control)  Assist level: Minimal Assistance - Patient > 75% Assistive device: Walker-rolling Max distance: 163ft   Walk 10 feet activity   Assist  Walk 10 feet activity did not occur: Safety/medical concerns(back pain, bilateral LE weakness, poor bilateral knee control)  Assist level: Minimal Assistance - Patient > 75% Assistive device: Walker-rolling  Walk 50 feet activity   Assist Walk 50 feet with 2 turns activity did not occur: Safety/medical concerns(back pain, bilateral LE weakness, poor bilateral knee control)  Assist level: Minimal Assistance - Patient > 75% Assistive device: Walker-rolling    Walk 150 feet activity   Assist Walk 150 feet activity did not occur: Safety/medical concerns(back pain, bilateral LE weakness, poor bilateral knee  control)  Assist level: Minimal Assistance - Patient > 75% Assistive device: Walker-rolling    Walk 10 feet on uneven surface  activity   Assist Walk 10 feet on uneven surfaces activity did not occur: Safety/medical concerns(back pain, bilateral LE weakness, poor bilateral knee control)   Assist level: Minimal Assistance - Patient > 75% Assistive device: Aeronautical engineer Will patient use wheelchair at discharge?: Yes Type of Wheelchair: Manual Wheelchair activity did not occur: Safety/medical concerns(Pt could not tolerate sitting in WC for long enough to complete activity due to low back pain)  Wheelchair assist level: Supervision/Verbal cueing Max wheelchair distance: 59ft    Wheelchair 50 feet with 2 turns activity    Assist    Wheelchair 50 feet with 2 turns activity did not occur: Safety/medical concerns(Pt could not tolerate sitting in WC for long enough to complete activity due to low back pain)   Assist Level: Supervision/Verbal cueing   Wheelchair 150 feet activity     Assist  Wheelchair 150 feet activity did not occur: Safety/medical concerns(Pt could not tolerate sitting in WC for long enough to complete activity due to low back pain)       Blood pressure 93/60, pulse 82, temperature 98.3 F (36.8 C), resp. rate 17, height 5\' 7"  (1.702 m), weight 60 kg, SpO2 97 %.    Medical Problem List and Plan: 1.  Impaired gait with left side weakness secondary to right basal ganglia infarction as well as small cortical infarct left frontal lobe  CT head on 12/13 personally reviewed, showing some improvement, MRI personally reviewed, stable. Repeat MRI ordered again by ID, showing stable infarcts  Chest x-ray personally reviewed, unremarkable for infection  Discussed with neurology results of the EEG -findings possibly related to uremia +/- cefepime, signed off  Discussed with psychiatry, continue medical management, signed  off  Discussed with hospitalist, appreciate recs, discussed with hospitalist again on 12/25, signed off  Continue CIR 2.  Antithrombotics: -DVT/anticoagulation: Right IJ acute DVT.  Plan anticoagulation x3 months  Transitioned to Eliquis as it has been very difficult to manage INR    Repeat U/S suggesting chronic DVT             -antiplatelet therapy: N/A 3. Pain Management chronic back and left hip pain: Neurontin 100 mg on hold  TLSO back brace for comfort  Lidocaine patch added to coccyx  Voltaren gel added for left greater trochanteric area on 1/1 with improvement 4. Mood: Provide emotional support             -antipsychotic agents: N/A 5. Neuropsych: This patient is not capable of making decisions on her own behalf. 6. Skin/Wound Care: Routine skin checks  Santyl to sacral wound - WOC notes reviewed 7. Fluids/Electrolytes/Nutrition: Routine in and outs 8.  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  New HD tunneled catheter placed 10/26/2019.  Repeat echo showing increase in mitral regurg  CT surgery recommending mitral valve replacement, not recommending surgical intervention at present and recommends medical management- will follow as outpatient. 9.  ID/discitis/recurrent Pseudomonas bacteremia.    Cipro/Maxipime changed to ceftazidime/Cipro through 12/05/2018.  Seen by neurosurgery on 12/25, no surgical intervention recommended at this point, however recommended following ID recs  Appreciate ID recs, signed off 10.    ESRD MWF.  Follow-up per renal services.  11.  Diabetes mellitus.  Hemoglobin A1c 7.6.  SSI. CBG (last 3)  Recent Labs    11/29/19 1842 11/29/19 2052 11/30/19 0614  GLUCAP 156* 148* 110*   Slightly labile on 1/7 12.  Hypertension.  Imdur 30 mg daily  Coreg 6.25 mg twice daily, increased to 12.5 on 12/15, parameters placed.    Monitor with increased mobility.  Soft, but  asymptomatic on 1/7 13.  Acute on chronic anemia.    Hemoglobin 9.1 on 1/6  Continue to monitor 14. Constipation:  See #16  Abd xray showing?  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  Candidiasis  Diflucan ordered on 12/16  Repeat Ucx not collected yet on 1/7 16.  Hyperammonemia with hepatic encephalopathy  Lactulose started on 12/17, encourage compliance  Ammonia WNL on 12/22 17. Sleep disturance  Melatonin added on 12/17  Sleep chart ordered  Improving 18.  Leukocytosis: Resolved  Afebrile  Continue to monitor 19.  GERD  Home Protonix ordered on 12/20 20.  Pneumatosis Coli  CT abdomen/pelvis on 12/24?  Obese.  Appreciate surgery recs-recommending GI recs, no surgical intervention, signed off  Appreciate GI recs, advising against colonoscopy and recommending conservative care at present with repeat imaging in 1-3 months.  LOS: 29 days A FACE TO FACE EVALUATION WAS PERFORMED  Keeghan Mcintire Lorie Phenix 11/30/2019, 8:38 AM

## 2019-11-30 NOTE — Progress Notes (Signed)
Physical Therapy Session Note  Patient Details  Name: [name] MRN: [id number] Date of Birth: 27-Jan-1951  Today's Date: 11/30/2019 PT Individual Time: 1517-6160 PT Individual Time Calculation (min): 57 min   Short Term Goals: Week 3:  PT Short Term Goal 1 (Week 3): Pt will transfer supine<>sit and sit<>supine with min A PT Short Term Goal 1 - Progress (Week 3): Met PT Short Term Goal 2 (Week 3): Pt will transfer stand<>pivot with LRAD mod A PT Short Term Goal 2 - Progress (Week 3): Not progressing PT Short Term Goal 3 (Week 3): Pt will perform car transfer with LRAD mod A PT Short Term Goal 3 - Progress (Week 3): Not progressing Week 4:  PT Short Term Goal 1 (Week 4): Pt will transfer supine<>sit and sit<>supine with CGA PT Short Term Goal 1 - Progress (Week 4): Met PT Short Term Goal 2 (Week 4): Pt will transfer stand<>pivot with LRAD mod A PT Short Term Goal 2 - Progress (Week 4): Met PT Short Term Goal 3 (Week 4): Pt will perform car transfer with LRAD mod A PT Short Term Goal 3 - Progress (Week 4): Progressing toward goal  Skilled Therapeutic Interventions/Progress Updates:   Received pt supine in bed, pt agreeable to therapy, and did not state pain level. Son present at bedside. Session focused on functional mobility/traansfers, ambulation, stair navigation, LE strength, balance, and improved endurance with activity. Pt performed bed mobility with supervision. PT donned shoes max A for time management. Pt transferred stand<>pivot bed<>WC without AD min A. Pt performed WC mobility 185f with bilateral UE and supervision. Pt ambulated 830fwith RW min A. Pt performed standing alternating toe taps on 6 in step 2x15 with RW CGA/min A. Pt navigated 4 steps x 2 trials with 2 rails ascending and descending with a step to pattern min A. Pt required verbal cues for technique, step sequence, and safety when turning. Pt reported that her LLE felt weak and PT pulled up chair immediately. Pt  educated on importance of seated rest breaks when she gets the feeling that her leg is giving out; pt verbalized understanding. Pt ambulated additional 9019fith RW min A. Pt performed bilateral UE and LE strengthening on Nustep at workload 4 for 5 minutes for total of 173 steps. Concluded session with pt supine in bed, needs within reach, and bed alarm on.   Therapy Documentation Precautions:  Precautions Precautions: Fall Precaution Comments: Strong L lateral lean, TLSO for mobility Required Braces or Orthoses: Other Brace(TLSO) Spinal Brace: Thoracolumbosacral orthotic Spinal Brace Comments: for comfort with mobility Restrictions Weight Bearing Restrictions: No   Therapy/Group: Individual Therapy AnnAlfonse Alpers, DPT   11/30/2019, 7:44 AM

## 2019-11-30 NOTE — Progress Notes (Signed)
Occupational Therapy Session Note  Patient Details  Name: [name] MRN: [id number] Date of Birth: [birth year]  Today's Date: 11/30/2019 OT Individual Time: 3419-6222 OT Individual Time Calculation (min): 45 min    Short Term Goals: Week 2:  OT Short Term Goal 1 (Week 2): Pt will perform shower transfer with mod A. OT Short Term Goal 1 - Progress (Week 2): Not met OT Short Term Goal 2 (Week 2): Pt will perform toileting with mod A. OT Short Term Goal 2 - Progress (Week 2): Not met OT Short Term Goal 3 (Week 2): Pt will complete LB dressing with mod assist OT Short Term Goal 3 - Progress (Week 2): Not met OT Short Term Goal 4 (Week 2): Pt will tolerate sitting OOB 2 hrs to demonstrate increased activity tolerance OT Short Term Goal 4 - Progress (Week 2): Not met Week 3:  OT Short Term Goal 1 (Week 3): Pt will perform shower transfer with mod A. OT Short Term Goal 1 - Progress (Week 3): Met OT Short Term Goal 2 (Week 3): Pt will perform toileting with mod A. OT Short Term Goal 2 - Progress (Week 3): Met OT Short Term Goal 3 (Week 3): Pt will complete LB dressing with mod assist OT Short Term Goal 3 - Progress (Week 3): Met OT Short Term Goal 4 (Week 3): Pt will tolerate sitting OOB 2 hrs to demonstrate increased activity tolerance OT Short Term Goal 4 - Progress (Week 3): Progressing toward goal Week 4:  OT Short Term Goal 1 (Week 4): Pt will tolerate sitting OOB 2 hrs to demonstrate increased activity tolerance OT Short Term Goal 2 (Week 4): Pt will compete bathing with mod assist OT Short Term Goal 3 (Week 4): Pt will complete LB dressing at sit > stand level with min assist OT Short Term Goal 4 (Week 4): Pt will don socks and shoes with min assist OT Short Term Goal 5 (Week 4): Pt will complete toilet transfer with min assist  Skilled Therapeutic Interventions/Progress Updates:    1:1 Pt in bed reporting having a bad night due to being cold all night long. Pt transitioned to  EOB with bed rail with supervision with extra time. Pt able to doff dirty shirt and don clean one with setup. Pt transferred to regular chair to try to accommodate pain and try a new position. Pt with continued pain and with rest breaks as needed, pt donned clean brief (threaded) and pants with min A. Shoes donned total A due to pain. Pt transferred with RW with min guard bed to w/c to bed back to w/c back to bed all with min A with RW or squat pivot with min A. Tried to eat breakfast in w/c but unable to tolerate sitting in w/c even with heat pad applied to hip. Returned to bed and left resting with pad on hip.   Therapy Documentation Precautions:  Precautions Precautions: Fall Precaution Comments: Strong L lateral lean, TLSO for mobility Required Braces or Orthoses: Other Brace(TLSO) Spinal Brace: Thoracolumbosacral orthotic Spinal Brace Comments: for comfort with mobility Restrictions Weight Bearing Restrictions: No General: General OT Amount of Missed Time: 15 Minutes Vital Signs:   Pain:  faces pain scale 8/10. Pt reports significant pain in left hip. Tried to work through the pain after received pain meds and precribed cream applied to bilateral hips and lower back.  Allowed for rest breaks and different positioning. Also applied heat to hip.  Missed 15 min and left in  bed with heat pad on hip. NT and RN aware.   Therapy/Group: Individual Therapy  Willeen Cass [facility] 11/30/2019, 9:20 AM

## 2019-12-01 ENCOUNTER — Inpatient Hospital Stay (HOSPITAL_COMMUNITY): Payer: Medicare PPO

## 2019-12-01 ENCOUNTER — Inpatient Hospital Stay (HOSPITAL_COMMUNITY): Payer: Medicare PPO | Admitting: Occupational Therapy

## 2019-12-01 ENCOUNTER — Inpatient Hospital Stay (HOSPITAL_COMMUNITY): Payer: Medicare PPO | Admitting: Speech Pathology

## 2019-12-01 LAB — CBC
HCT: 30.9 % — ABNORMAL LOW (ref 36.0–46.0)
Hemoglobin: 9.2 g/dL — ABNORMAL LOW (ref 12.0–15.0)
MCH: 28.3 pg (ref 26.0–34.0)
MCHC: 29.8 g/dL — ABNORMAL LOW (ref 30.0–36.0)
MCV: 95.1 fL (ref 80.0–100.0)
Platelets: 232 10*3/uL (ref 150–400)
RBC: 3.25 MIL/uL — ABNORMAL LOW (ref 3.87–5.11)
RDW: 19.2 % — ABNORMAL HIGH (ref 11.5–15.5)
WBC: 6.3 10*3/uL (ref 4.0–10.5)
nRBC: 0.3 % — ABNORMAL HIGH (ref 0.0–0.2)

## 2019-12-01 LAB — GLUCOSE, CAPILLARY
Glucose-Capillary: 151 mg/dL — ABNORMAL HIGH (ref 70–99)
Glucose-Capillary: 181 mg/dL — ABNORMAL HIGH (ref 70–99)
Glucose-Capillary: 87 mg/dL (ref 70–99)
Glucose-Capillary: 161 mg/dL — ABNORMAL HIGH (ref 70–99)

## 2019-12-01 LAB — RENAL FUNCTION PANEL
Albumin: 2 g/dL — ABNORMAL LOW (ref 3.5–5.0)
Anion gap: 15 (ref 5–15)
BUN: 38 mg/dL — ABNORMAL HIGH (ref 8–23)
CO2: 25 mmol/L (ref 22–32)
Calcium: 8.8 mg/dL — ABNORMAL LOW (ref 8.9–10.3)
Chloride: 96 mmol/L — ABNORMAL LOW (ref 98–111)
Creatinine, Ser: 8.43 mg/dL — ABNORMAL HIGH (ref 0.44–1.00)
GFR calc Af Amer: 5 mL/min — ABNORMAL LOW (ref >60)
GFR calc non Af Amer: 4 mL/min — ABNORMAL LOW (ref >60)
Glucose, Bld: 174 mg/dL — ABNORMAL HIGH (ref 70–99)
Phosphorus: 3.6 mg/dL (ref 2.5–4.6)
Potassium: 4.7 mmol/L (ref 3.5–5.1)
Sodium: 136 mmol/L (ref 135–145)

## 2019-12-01 MED ORDER — DOXERCALCIFEROL 4 MCG/2ML IV SOLN
INTRAVENOUS | Status: AC
  Filled 2019-12-01: qty 2

## 2019-12-01 MED ORDER — HEPARIN SODIUM (PORCINE) 1000 UNIT/ML IJ SOLN
INTRAMUSCULAR | Status: AC
  Administered 2019-12-01: 3200 [IU]
  Filled 2019-12-01: qty 4

## 2019-12-01 NOTE — Progress Notes (Signed)
Speech Language Pathology Daily Session Note  Patient Details  Name: [name] MRN: [id number] Date of Birth: 16-Jul-1951  Today's Date: 12/01/2019 SLP Individual Time: 0930-1028 SLP Individual Time Calculation (min): 58 min  Short Term Goals: Week 5: SLP Short Term Goal 1 (Week 5): STG=LTG due to remaining LOS  Skilled Therapeutic Interventions: Pt was seen for skilled ST targeting cognition. Pt recalled 6/10 current medication names and/or functions Mod I. Supervision-Min A verbal cues provided for recall of remaining 4. Pt requested to keep medication list at bedside to study over weekend with goal to improve recall in ST on Monday. Since pt met Supervision A level goals for problem solving during basic tasks, SLP provided opportunities to complete tasks of increased challenge through 2 semi-complex to complex deductive reasoning worksheets. Pt completed tasks with Min A verbal and visual cues for problem solving and organization. Pt is excited about d/c date being moved up to early next week and reports family will be in on Monday for education. Pt selectively attended to tasks and adjusted her own environment for easier concentration Mod I. Pt left laying in be with alarm set and needs met. Continue per current plan of care.       Pain Pain Assessment Pain Scale: Faces Faces Pain Scale: No hurt  Therapy/Group: Individual Therapy  Arbutus Leas 12/01/2019, 10:33 AM

## 2019-12-01 NOTE — Progress Notes (Signed)
Pt slept mostly throughout the night, Pt verbalized needs and they were met. No signs of distress during night.  

## 2019-12-01 NOTE — Progress Notes (Signed)
  Patient ID: [name], female   DOB: [birth year], 69 y.o.   MRN: [id number]     Diagnosis codes:  I63.9; G81.94; I33.0 and N18.6  Height:       5'7"         Weight:    138 obs        Patient suffers from right CVA with left hemiplegia, endocarditis and ESRD which impairs her ability to perform daily activities like bathing, dressing and mobility in the home.  A walker will not resolve issue with performing activities of daily living.  A wheelchair will allow patient to safely perform daily activities.  Patient is not able to propel themselves in the home using a standard weight wheelchair due to hemiplegia and weakness.  Patient can self propel in the lightweight wheelchair.  Lauraine Rinne, PA-C

## 2019-12-01 NOTE — Progress Notes (Signed)
[facility] PHYSICAL MEDICINE & REHABILITATION PROGRESS NOTE   Subjective/Complaints: Patient seen sitting and then ambulating with therapy this morning.  She states she would like to go home soon as possible.  Discussed with therapies and case manager.  Patient states she slept well overnight.  She is seen by nephrology yesterday, notes reviewed.  ROS: Denies CP, SOB, N/V/D  Objective:   No results found. Recent Labs    11/29/19 1317  WBC 8.6  HGB 9.1*  HCT 30.0*  PLT 223   Recent Labs    11/29/19 1317  NA 140  K 4.6  CL 100  CO2 26  GLUCOSE 196*  BUN 42*  CREATININE 8.40*  CALCIUM 8.6*    Intake/Output Summary (Last 24 hours) at 12/01/2019 0947 Last data filed at 12/01/2019 0730 Gross per 24 hour  Intake 420 ml  Output --  Net 420 ml     Physical Exam: Vital Signs Blood pressure 111/71, pulse 86, temperature 98.4 F (36.9 C), temperature source Oral, resp. rate 16, height 5\' 7"  (1.702 m), weight 60 kg, SpO2 100 %.  Constitutional: No distress . Vital signs reviewed. HENT: Normocephalic.  Atraumatic. Eyes: EOMI. No discharge. Cardiovascular: No JVD. Respiratory: Normal effort.  No stridor. GI: Non-distended. Skin: Warm and dry.  Intact. Psych: Normal mood.  Normal behavior. Musc: No edema in extremities.  No tenderness in extremities. Neuro: Alert and oriented x3 Motor:  RUE: 5/5 proximal to distal RLE: HF, KE 4+/5, ADF 4+/5 (pain inhibition), unchanged LUE: 4+/5 proximal to distal LLE: HF, KE 4--4/5, ADF 4+/5 (pain inhibition), unchanged  Assessment/Plan: 1. Functional deficits secondary to right basal ganglia stroke which require 3+ hours per day of interdisciplinary therapy in a comprehensive inpatient rehab setting.  Physiatrist is providing close team supervision and 24 hour management of active medical problems listed below.  Physiatrist and rehab team continue to assess barriers to discharge/monitor patient progress toward functional and medical  goals  Care Tool:  Bathing    Body parts bathed by patient: Right arm, Left arm, Chest, Abdomen, Front perineal area, Right upper leg, Left upper leg, Face, Right lower leg, Left lower leg   Body parts bathed by helper: Buttocks     Bathing assist Assist Level: Minimal Assistance - Patient > 75%     Upper Body Dressing/Undressing Upper body dressing   What is the patient wearing?: Pull over shirt    Upper body assist Assist Level: Set up assist    Lower Body Dressing/Undressing Lower body dressing      What is the patient wearing?: Pants, Incontinence brief     Lower body assist Assist for lower body dressing: Minimal Assistance - Patient > 75%     Toileting Toileting    Toileting assist Assist for toileting: Maximal Assistance - Patient 25 - 49%     Transfers Chair/bed transfer  Transfers assist     Chair/bed transfer assist level: Minimal Assistance - Patient > 75% Chair/bed transfer assistive device: Programmer, multimedia   Ambulation assist   Ambulation activity did not occur: Safety/medical concerns(back pain, bilateral LE weakness, poor bilateral knee control)  Assist level: Minimal Assistance - Patient > 75% Assistive device: Walker-rolling Max distance: 57ft   Walk 10 feet activity   Assist  Walk 10 feet activity did not occur: Safety/medical concerns(back pain, bilateral LE weakness, poor bilateral knee control)  Assist level: Minimal Assistance - Patient > 75% Assistive device: Walker-rolling   Walk 50 feet activity   Assist  Walk 50 feet with 2 turns activity did not occur: Safety/medical concerns(back pain, bilateral LE weakness, poor bilateral knee control)  Assist level: Minimal Assistance - Patient > 75% Assistive device: Walker-rolling    Walk 150 feet activity   Assist Walk 150 feet activity did not occur: Safety/medical concerns(back pain, bilateral LE weakness, poor bilateral knee control)  Assist level:  Minimal Assistance - Patient > 75% Assistive device: Walker-rolling    Walk 10 feet on uneven surface  activity   Assist Walk 10 feet on uneven surfaces activity did not occur: Safety/medical concerns(back pain, bilateral LE weakness, poor bilateral knee control)   Assist level: Minimal Assistance - Patient > 75% Assistive device: Aeronautical engineer Will patient use wheelchair at discharge?: Yes Type of Wheelchair: Manual Wheelchair activity did not occur: Safety/medical concerns(Pt could not tolerate sitting in WC for long enough to complete activity due to low back pain)  Wheelchair assist level: Supervision/Verbal cueing Max wheelchair distance: 179ft    Wheelchair 50 feet with 2 turns activity    Assist    Wheelchair 50 feet with 2 turns activity did not occur: Safety/medical concerns(Pt could not tolerate sitting in [facility] for long enough to complete activity due to low back pain)   Assist Level: Supervision/Verbal cueing   Wheelchair 150 feet activity     Assist  Wheelchair 150 feet activity did not occur: Safety/medical concerns(Pt could not tolerate sitting in [facility] for long enough to complete activity due to low back pain)       Blood pressure 111/71, pulse 86, temperature 98.4 F (36.9 C), temperature source Oral, resp. rate 16, height 5\' 7"  (1.702 m), weight 60 kg, SpO2 100 %.    Medical Problem List and Plan: 1.  Impaired gait with left side weakness secondary to right basal ganglia infarction as well as small cortical infarct left frontal lobe  CT head on 12/13 personally reviewed, showing some improvement, MRI personally reviewed, stable. Repeat MRI ordered again by ID, showing stable infarcts  Chest x-ray personally reviewed, unremarkable for infection  Discussed with neurology results of the EEG -findings possibly related to uremia +/- cefepime, signed off  Discussed with psychiatry, continue medical management, signed  off  Discussed with hospitalist, appreciate recs, discussed with hospitalist again on 12/25, signed off  Continue CIR 2.  Antithrombotics: -DVT/anticoagulation: Right IJ acute DVT.  Plan anticoagulation x3 months  Transitioned to Eliquis as it has been very difficult to manage INR    Repeat U/S suggesting chronic DVT             -antiplatelet therapy: N/A 3. Pain Management chronic back and left hip pain: Neurontin 100 mg on hold  TLSO back brace for comfort  Lidocaine patch added to coccyx  Voltaren gel added for left greater trochanteric area on 1/1 with improvement 4. Mood: Provide emotional support             -antipsychotic agents: N/A 5. Neuropsych: This patient is not capable of making decisions on her own behalf. 6. Skin/Wound Care: Routine skin checks  Santyl to sacral wound - WOC notes reviewed 7. Fluids/Electrolytes/Nutrition: Routine in and outs 8.  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  New HD tunneled catheter placed 10/26/2019.  Repeat echo showing increase in mitral regurg  CT surgery recommending mitral valve replacement, not recommending surgical intervention at present and recommends medical management- will follow as outpatient. 9.  ID/discitis/recurrent Pseudomonas bacteremia.  Cipro/Maxipime changed to ceftazidime/Cipro through 12/06/2019.  Seen by neurosurgery on 12/25, no surgical intervention recommended at this point, however recommended following ID recs  Appreciate ID recs, signed off 10.  ESRD MWF.  Follow-up per renal services.  11.  Diabetes mellitus.  Hemoglobin A1c 7.6.  SSI. CBG (last 3)  Recent Labs    11/30/19 1710 11/30/19 2115 12/01/19 0606  GLUCAP 116* 145* 151*   Slightly elevated on 1/8, monitor for trend 12.  Hypertension.  Imdur 30 mg daily  Coreg 6.25 mg twice daily, increased to 12.5 on 12/15, parameters placed.    Monitor with increased  mobility.  Soft, but asymptomatic on 1/8 13.  Acute on chronic anemia.    Hemoglobin 9.1 on 1/6  Continue to monitor 14. Constipation:  See #16  Abd xray showing?  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  Candidiasis  Diflucan ordered on 12/16  Repeat Ucx not collected yet on 1/7 16.  Hyperammonemia with hepatic encephalopathy  Lactulose started on 12/17, encourage compliance  Ammonia WNL on 12/22 17. Sleep disturance  Melatonin added on 12/17  Sleep chart ordered  Improving 18.  Leukocytosis: Resolved  Afebrile  Continue to monitor 19.  GERD  Home Protonix ordered on 12/20 20.  Pneumatosis Coli  CT abdomen/pelvis on 12/24?  Obese.  Appreciate surgery recs-recommending GI recs, no surgical intervention, signed off  Appreciate GI recs, advising against colonoscopy and recommending conservative care at present with repeat imaging in 1-3 months.  LOS: 30 days A FACE TO FACE EVALUATION WAS PERFORMED  Duayne Brideau Lorie Phenix 12/01/2019, 9:47 AM

## 2019-12-01 NOTE — Progress Notes (Signed)
[facility] Progress Note    Assessment/ Plan:   1. Acute CVA with evidence of chronic ischemia/old infarcts:admit Oct 20, 2019, L sided weakness. Seen by neurology-suspected embolic infarct from endocarditis. Ongoing inpatient rehabilitation for management of neurological deficits. Repeat MRI showedno acute changes 2. Pseudomonas MV endocarditis/ bacteremia: HD cath exchanged for infection focus control. Seen by TCTS w/ no plan for surgical intervention.Antiboitic through 12/06/19. 3. Progression L3-4 discitis on MRI. Evaluated by neurosurgery. No indication for surgical intervention. ID has directed ABX and duration. Antibiotics changed to Fortaz/Cipro.  4. R IJ acute DVT: on 3 mos Eliquis (change from coumadin due to difficulties managing INR) 5. ESRD: Typically MWF HD. ESRD x 1 year, tried AVF "6 times" w/o success. TDC dependent. HD today (this week has been TTS), generally 1L UF.  3K bath here. Tight heparin.  6. Anemia:hgb improved to 9s. ESA qMon. tsat 16% 12/15 - Fe held due to being treated for infection 7. 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  8. Hypertension/volume : BP low/stable. No vol^ on exam.  9.Nutrition -diet liberalized to regular - has supplements/multivits ordered - alb very low - not eating much - contributing to weakness 10. Deconditioning -in CIR - afternoon dialysis to help with AM therapy stamina     Subjective:    No new complaints. No SOB, CP or abd pain.  No fevers, chills.   Objective:   BP 111/71 (BP Location: Left Arm)   Pulse 86   Temp 98.4 F (36.9 C) (Oral)   Resp 16   Ht 5\' 7"  (1.702 m)   Wt 60 kg Comment: bed scale  SpO2 100%   BMI 20.72 kg/m   Intake/Output Summary (Last 24 hours) at 12/01/2019 1246 Last data filed at 12/01/2019 0730 Gross per 24 hour  Intake 420 ml  Output --  Net 420 ml   Weight change:   HD Outpatient: [facility] MWF  3.5h  TDC (mult AVF  attempts failed)  62.5kg  2/2.5 bath 400/600 - hect 1.5 ug - EPO 1200u each HD, iron 50mg  weekly  Physical Exam: Gen: NAD, on L side in bed vomiting. CVS: RRR Resp: clear bilaterally Abd: soft nontender NABS Ext: no LE edema  Imaging: No results found.  Labs: BMET Recent Labs  Lab 11/27/19 1625 11/29/19 1317  NA 140 140  K 3.8 4.6  CL 98 100  CO2 29 26  GLUCOSE 178* 196*  BUN 40* 42*  CREATININE 8.15* 8.40*  CALCIUM 8.6* 8.6*  PHOS 3.3 3.6   CBC Recent Labs  Lab 11/27/19 0529 11/27/19 1625 11/29/19 1317  WBC 8.2 7.8 8.6  HGB 9.2* 8.9* 9.1*  HCT 30.3* 29.2* 30.0*  MCV 93.8 92.4 93.5  PLT 266 254 223    Medications:    . acetaminophen  500 mg Oral TID  . apixaban  5 mg Oral BID  . calcium carbonate  1 tablet Oral 2 times per day  . carvedilol  12.5 mg Oral BID  . Chlorhexidine Gluconate Cloth  6 each Topical Q0600  . ciprofloxacin  500 mg Oral Q24H  . collagenase   Topical Daily  . darbepoetin (ARANESP) injection - DIALYSIS  100 mcg Intravenous Q Mon-HD  . diclofenac Sodium  2 g Topical QID  . doxercalciferol  1.5 mcg Intravenous Q M,W,F-HD  . feeding supplement (ENSURE ENLIVE)  237 mL Oral BID BM  . insulin aspart  0-9 Units Subcutaneous TID WC  . isosorbide  mononitrate  30 mg Oral QPC lunch  . lactulose  20 g Oral TID  . lidocaine  1 patch Transdermal Q24H  . Melatonin  3 mg Oral QHS  . multivitamin  1 tablet Oral QPC lunch  . Muscle Rub   Topical BID  . pantoprazole  40 mg Oral Daily  . saccharomyces boulardii  250 mg Oral BID

## 2019-12-01 NOTE — Progress Notes (Signed)
Physical Therapy Session Note  Patient Details  Name: Alexismarie Flaim MRN: [id number] Date of Birth: 03/29/1951  Today's Date: 12/01/2019 PT Individual Time: 1100-1200 PT Individual Time Calculation (min): 60 min   Short Term Goals: Week 3:  PT Short Term Goal 1 (Week 3): Pt will transfer supine<>sit and sit<>supine with min A PT Short Term Goal 1 - Progress (Week 3): Met PT Short Term Goal 2 (Week 3): Pt will transfer stand<>pivot with LRAD mod A PT Short Term Goal 2 - Progress (Week 3): Not progressing PT Short Term Goal 3 (Week 3): Pt will perform car transfer with LRAD mod A PT Short Term Goal 3 - Progress (Week 3): Not progressing Week 4:  PT Short Term Goal 1 (Week 4): Pt will transfer supine<>sit and sit<>supine with CGA PT Short Term Goal 1 - Progress (Week 4): Met PT Short Term Goal 2 (Week 4): Pt will transfer stand<>pivot with LRAD mod A PT Short Term Goal 2 - Progress (Week 4): Met PT Short Term Goal 3 (Week 4): Pt will perform car transfer with LRAD mod A PT Short Term Goal 3 - Progress (Week 4): Progressing toward goal  Skilled Therapeutic Interventions/Progress Updates:   Received pt supine in bed, pt agreeable to therapy, and did not report pain during session. Session focused on functional mobility/transfers, LE strength, balance, LLE NMR, and improved endurance with activity. Pt performed bed mobility with supervision. Pt transferred stand<>pivot without AD min A. Pt performed WC mobility 182f with bilateral UEs and supervision. Pt performed standing toe taps to cones min A x 4 standing on LLE and x10 standing on RLE. Pt with poor motor control tapping cones with LLE and decreased quad strength when standing on LLE resulting in LLE buckling and pt needing to sit and rest. Pt performed mini squats with RW 2x5 with bilateral UE on RW min A. Pt performed standing bilateral hip abduction x 10 with bilateral UE support on parallel bars min A and tactile cues to prevent LLE  buckling. Pt performed standing bilateral marching x 20 with bilateral UE support on parallel bars min A. Pt extremly fatigued and requiring multiple rest breaks throughout session. Pt requested to perform supine exercises and transferred stand<>pivot min A to mat and sit<>supine supervision. Pt performed bridges 3x10, LTR 3x30 seconds, R sidelying LLE hip abduction and hip extension 2x5 attempted knee flexion 1x5 but pt reported increased pain and stopped activity. Pt performed L sidelying RLE hip abduction, hip extension, and hip flexion 2x5. Pt transferred scoot<>pivot mat<>WC min A. Concluded session with pt supine in bed, needs within reach, and bed alarm on.   Therapy Documentation Precautions:  Precautions Precautions: Fall Precaution Comments: Strong L lateral lean, TLSO for mobility Required Braces or Orthoses: Other Brace(TLSO) Spinal Brace: Thoracolumbosacral orthotic Spinal Brace Comments: for comfort with mobility Restrictions Weight Bearing Restrictions: No   Therapy/Group: Individual Therapy AAlfonse AlpersPT, DPT   12/01/2019, 7:34 AM

## 2019-12-01 NOTE — Progress Notes (Signed)
Occupational Therapy Session Note  Patient Details  Name: [name] MRN: [id number] Date of Birth: June 16, 1951  Today's Date: 12/01/2019 OT Individual Time: [id number] Individual Time Calculation (min): 53 min    Short Term Goals: Week 4:  OT Short Term Goal 1 (Week 4): Pt will tolerate sitting OOB 2 hrs to demonstrate increased activity tolerance OT Short Term Goal 2 (Week 4): Pt will compete bathing with mod assist OT Short Term Goal 3 (Week 4): Pt will complete LB dressing at sit > stand level with min assist OT Short Term Goal 4 (Week 4): Pt will don socks and shoes with min assist OT Short Term Goal 5 (Week 4): Pt will complete toilet transfer with min assist  Skilled Therapeutic Interventions/Progress Updates:    Treatment session with focus on LB dressing, dynamic standing balance, and functional mobility.  Pt received supine in bed agreeable to therapy session.  Pt declined bathing/dressing this session.  Donned socks at bed level and shoes with mod assist.  Completed stand pivot transfer bed > w/c with min assist with RW.  Pt propelled w/c 100' with supervision, with occasional veering towards Lt, for BUE strengthening.  Engaged in dynamic standing activity with focus on weight shifting and alternating UE support on RW.  Engaged in reaching to utilize card board while challenging dynamic balance and balance reactions.  Min assist for standing balance without UE support.  Pt demonstrating increase knee instability without UE support.  Ambulated 107' with RW with min assist, following prolonged rest break pt ambulated 200' with RW with min assist and w/c follow for safety.  Pt returned to bed due to fatigue and pain in Lt hip.  Therapy Documentation Precautions:  Precautions Precautions: Fall Precaution Comments: Strong L lateral lean, TLSO for mobility Required Braces or Orthoses: Other Brace(TLSO) Spinal Brace: Thoracolumbosacral orthotic Spinal Brace Comments: for comfort  with mobility Restrictions Weight Bearing Restrictions: No General:   Vital Signs: Therapy Vitals Temp: 98.4 F (36.9 C) Temp Source: Oral Pulse Rate: 86 Resp: 16 BP: 111/71 Patient Position (if appropriate): Lying Oxygen Therapy SpO2: 100 % O2 Device: Room Air Pain:  Pt with c/o pain in Lt hip.  RN notified.  Positioned on kpad for pain relief   Therapy/Group: Individual Therapy  [name] 12/01/2019, 8:32 AM

## 2019-12-02 ENCOUNTER — Inpatient Hospital Stay (HOSPITAL_COMMUNITY): Payer: Medicare PPO | Admitting: Physical Therapy

## 2019-12-02 LAB — GLUCOSE, CAPILLARY
Glucose-Capillary: 103 mg/dL — ABNORMAL HIGH (ref 70–99)
Glucose-Capillary: 161 mg/dL — ABNORMAL HIGH (ref 70–99)
Glucose-Capillary: 152 mg/dL — ABNORMAL HIGH (ref 70–99)
Glucose-Capillary: 145 mg/dL — ABNORMAL HIGH (ref 70–99)

## 2019-12-02 NOTE — Progress Notes (Signed)
Pt states she did not sleep well during night but every time RN made rounds on pt, it appeared she was sleeping. Pt c.o bed being uncomfortable, adjusted air flow on mattress.

## 2019-12-02 NOTE — Progress Notes (Signed)
[facility] PHYSICAL MEDICINE & REHABILITATION PROGRESS NOTE   Subjective/Complaints: No other new complaints today.  She is looking forward to her discharge next week.  ROS: Denies CP, SOB, N/V/D  Objective:   No results found. Recent Labs    12/01/19 1255  WBC 6.3  HGB 9.2*  HCT 30.9*  PLT 232   Recent Labs    12/01/19 1255  NA 136  K 4.7  CL 96*  CO2 25  GLUCOSE 174*  BUN 38*  CREATININE 8.43*  CALCIUM 8.8*    Intake/Output Summary (Last 24 hours) at 12/02/2019 1340 Last data filed at 12/02/2019 0818 Gross per 24 hour  Intake 358 ml  Output 550 ml  Net -192 ml     Physical Exam: Vital Signs Blood pressure (!) 143/90, pulse 82, temperature 98.8 F (37.1 C), resp. rate 16, height 5\' 7"  (1.702 m), weight 58 kg, SpO2 96 %.  Constitutional: No distress . Vital signs reviewed. HENT: Normocephalic.  Atraumatic. Eyes: EOMI. No discharge. Cardiovascular: No JVD. Respiratory: Normal effort.  No stridor. GI: Non-distended. Skin: Warm and dry.  Intact. Psych: Normal mood.  Normal behavior. Musc: No edema in extremities.  No tenderness in extremities. Neuro: Alert and oriented x3 Motor:  RUE: 5/5 proximal to distal RLE: HF, KE 4+/5, ADF 4+/5  unchanged LUE: 4+/5 proximal to distal LLE: HF, KE 4--4/5, ADF 4+/5 , unchanged  Assessment/Plan: 1. Functional deficits secondary to right basal ganglia stroke which require 3+ hours per day of interdisciplinary therapy in a comprehensive inpatient rehab setting.  Physiatrist is providing close team supervision and 24 hour management of active medical problems listed below.  Physiatrist and rehab team continue to assess barriers to discharge/monitor patient progress toward functional and medical goals  Care Tool:  Bathing    Body parts bathed by patient: Right arm, Left arm, Chest, Abdomen, Front perineal area, Right upper leg, Left upper leg, Face, Right lower leg, Left lower leg   Body parts bathed by helper:  Buttocks     Bathing assist Assist Level: Minimal Assistance - Patient > 75%     Upper Body Dressing/Undressing Upper body dressing   What is the patient wearing?: Pull over shirt    Upper body assist Assist Level: Set up assist    Lower Body Dressing/Undressing Lower body dressing      What is the patient wearing?: Pants, Incontinence brief     Lower body assist Assist for lower body dressing: Minimal Assistance - Patient > 75%     Toileting Toileting    Toileting assist Assist for toileting: Maximal Assistance - Patient 25 - 49%     Transfers Chair/bed transfer  Transfers assist     Chair/bed transfer assist level: Minimal Assistance - Patient > 75% Chair/bed transfer assistive device: Armrests, Programmer, multimedia   Ambulation assist   Ambulation activity did not occur: Safety/medical concerns(back pain, bilateral LE weakness, poor bilateral knee control)  Assist level: Minimal Assistance - Patient > 75% Assistive device: Walker-rolling Max distance: 60 ft   Walk 10 feet activity   Assist  Walk 10 feet activity did not occur: Safety/medical concerns(back pain, bilateral LE weakness, poor bilateral knee control)  Assist level: Minimal Assistance - Patient > 75% Assistive device: Walker-rolling   Walk 50 feet activity   Assist Walk 50 feet with 2 turns activity did not occur: Safety/medical concerns(back pain, bilateral LE weakness, poor bilateral knee control)  Assist level: Minimal Assistance - Patient > 75% Assistive device:  Walker-rolling    Walk 150 feet activity   Assist Walk 150 feet activity did not occur: Safety/medical concerns(back pain, bilateral LE weakness, poor bilateral knee control)  Assist level: Minimal Assistance - Patient > 75% Assistive device: Walker-rolling    Walk 10 feet on uneven surface  activity   Assist Walk 10 feet on uneven surfaces activity did not occur: Safety/medical concerns(back pain,  bilateral LE weakness, poor bilateral knee control)   Assist level: Minimal Assistance - Patient > 75% Assistive device: Aeronautical engineer Will patient use wheelchair at discharge?: Yes Type of Wheelchair: Manual Wheelchair activity did not occur: Safety/medical concerns(Pt could not tolerate sitting in WC for long enough to complete activity due to low back pain)  Wheelchair assist level: Supervision/Verbal cueing Max wheelchair distance: 141ft    Wheelchair 50 feet with 2 turns activity    Assist    Wheelchair 50 feet with 2 turns activity did not occur: Safety/medical concerns(Pt could not tolerate sitting in WC for long enough to complete activity due to low back pain)   Assist Level: Supervision/Verbal cueing   Wheelchair 150 feet activity     Assist  Wheelchair 150 feet activity did not occur: Safety/medical concerns(Pt could not tolerate sitting in WC for long enough to complete activity due to low back pain)   Assist Level: Supervision/Verbal cueing   Blood pressure (!) 143/90, pulse 82, temperature 98.8 F (37.1 C), resp. rate 16, height 5\' 7"  (1.702 m), weight 58 kg, SpO2 96 %.    Medical Problem List and Plan: 1.  Impaired gait with left side weakness secondary to right basal ganglia infarction as well as small cortical infarct left frontal lobe Continue CIR PT, speech and OT  Discussed with neurology results of the EEG -findings possibly related to uremia +/- cefepime, signed off  Discussed with psychiatry, continue medical management, signed off  Discussed with hospitalist, appreciate recs, discussed with hospitalist again on 12/25, signed off  Continue CIR 2.  Antithrombotics: -DVT/anticoagulation: Right IJ acute DVT.  Plan anticoagulation x3 months  Transitioned to Eliquis as it has been very difficult to manage INR    Repeat U/S suggesting chronic DVT             -antiplatelet therapy: N/A 3. Pain Management chronic back  and left hip pain: Neurontin 100 mg on hold  TLSO back brace for comfort  Lidocaine patch added to coccyx  Voltaren gel added for left greater trochanteric area on 1/1 with improvement 4. Mood: Provide emotional support             -antipsychotic agents: N/A 5. Neuropsych: This patient is not capable of making decisions on her own behalf. 6. Skin/Wound Care: Routine skin checks  Santyl to sacral wound - WOC notes reviewed 7. Fluids/Electrolytes/Nutrition: Routine in and outs 8.  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  New HD tunneled catheter placed 10/26/2019.  Repeat echo showing increase in mitral regurg  CT surgery recommending mitral valve replacement, not recommending surgical intervention at present and recommends medical management- will follow as outpatient. 9.  ID/discitis/recurrent Pseudomonas bacteremia.    Cipro/Maxipime changed to ceftazidime/Cipro through 12/06/2019.  Seen by neurosurgery on 12/25, no surgical intervention recommended at this point, however recommended following ID recs  Appreciate ID recs, signed off 10.  ESRD MWF.  Follow-up per renal services.  11.  Diabetes mellitus.  Hemoglobin A1c 7.6.  SSI. CBG (last 3)  Recent Labs    12/01/19 2237 12/02/19 0645 12/02/19 1126  GLUCAP 161* 103* 161*   Slightly elevated on 1/8, monitor for trend 12.  Hypertension.  Imdur 30 mg daily  Coreg 6.25 mg twice daily, increased to 12.5 on 12/15, parameters placed.    Monitor with increased mobility.  Soft, but asymptomatic on 1/8 13.  Acute on chronic anemia.    Hemoglobin 9.1 on 1/6  Continue to monitor 14. Constipation:  See #16  Abd xray showing?  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  Candidiasis  Diflucan ordered on 12/16  Repeat Ucx not collected yet on 1/7 16.  Hyperammonemia with hepatic encephalopathy  Lactulose started on 12/17, encourage  compliance  Ammonia WNL on 12/22 17. Sleep disturance  Melatonin added on 12/17  Sleep chart ordered  Improving 18.  Leukocytosis: Resolved  Afebrile  Continue to monitor 19.  GERD  Home Protonix ordered on 12/20 20.  Pneumatosis Coli  CT abdomen/pelvis on 12/24?  Obese.  Appreciate surgery recs-recommending GI recs, no surgical intervention, signed off  Appreciate GI recs, advising against colonoscopy and recommending conservative care at present with repeat imaging in 1-3 months.  LOS: 31 days A FACE TO FACE EVALUATION WAS PERFORMED  Charlett Blake 12/02/2019, 1:40 PM

## 2019-12-02 NOTE — Progress Notes (Signed)
LBM on 1/6. Patient refusing lactulose past couple days. MD Kirstiens made aware. Patient given sorbitol at 1030. No results at this time. Continue to monitor.

## 2019-12-02 NOTE — Plan of Care (Signed)
  Problem: Consults Goal: RH STROKE PATIENT EDUCATION Description: See Patient Education module for education specifics  Outcome: Progressing Goal: Nutrition Consult-if indicated Outcome: Progressing Goal: Diabetes Guidelines if Diabetic/Glucose > 140 Description: If diabetic or lab glucose is > 140 mg/dl - Initiate Diabetes/Hyperglycemia Guidelines & Document Interventions  Outcome: Progressing   Problem: RH BOWEL ELIMINATION Goal: RH STG MANAGE BOWEL WITH ASSISTANCE Description: STG Manage Bowel with mod I/min Assistance. Outcome: Progressing Goal: RH STG MANAGE BOWEL W/MEDICATION W/ASSISTANCE Description: STG Manage Bowel with Medication with mod I Assistance. Outcome: Progressing   Problem: RH SKIN INTEGRITY Goal: RH STG SKIN FREE OF INFECTION/BREAKDOWN Description: Patients skin will remain free from further infection or breakdown with min assist. Outcome: Progressing Goal: RH STG MAINTAIN SKIN INTEGRITY WITH ASSISTANCE Description: STG Maintain Skin Integrity With min Assistance. Outcome: Progressing   Problem: RH SAFETY Goal: RH STG ADHERE TO SAFETY PRECAUTIONS W/ASSISTANCE/DEVICE Description: STG Adhere to Safety Precautions With supervision/min Assistance/Device. Outcome: Progressing Goal: RH STG DECREASED RISK OF FALL WITH ASSISTANCE Description: STG Decreased Risk of Fall With supervision/min Assistance. Outcome: Progressing   Problem: RH PAIN MANAGEMENT Goal: RH STG PAIN MANAGED AT OR BELOW PT'S PAIN GOAL Description: < 5 Outcome: Progressing   Problem: RH KNOWLEDGE DEFICIT Goal: RH STG INCREASE KNOWLEDGE OF DIABETES Description: Patient/caregiver will verbalize understanding of DM including diet, exercise, medications, monitoring, and follow up care with min assist. Outcome: Progressing Goal: RH STG INCREASE KNOWLEDGE OF HYPERTENSION Description: Patient/caregiver will verbalize understanding of HTN including diet, exercise, medications, monitoring, and follow  up care with min assist. Outcome: Progressing Goal: RH STG INCREASE KNOWLEGDE OF HYPERLIPIDEMIA Description: Patient/caregiver will verbalize understanding of HLD including diet, exercise, medications, monitoring, and follow up care with min assist. Outcome: Progressing Goal: RH STG INCREASE KNOWLEDGE OF STROKE PROPHYLAXIS Description: Patient/caregiver will verbalize understanding of stroke prophylaxis including diet, exercise, medications, monitoring, and follow up care with min assist. Outcome: Progressing

## 2019-12-02 NOTE — Progress Notes (Signed)
Physical Therapy Session Note  Patient Details  Name: [name] MRN: [id number] Date of Birth: 1951/02/21  Today's Date: 12/02/2019 PT Individual Time: 0940-1036 PT Individual Time Calculation (min): 56 min   Short Term Goals: Week 5:  PT Short Term Goal 1 (Week 5): Pt will perform bed mobility with supervision PT Short Term Goal 2 (Week 5): pt will perform car transfer with LRAD min A PT Short Term Goal 3 (Week 5): Pt will ambulate 57ft with LRAD min A  Skilled Therapeutic Interventions/Progress Updates:  Pt received in bed & agreeable to tx. Pt transfers supine>sitting EOB with supervision & hospital bed features. Therapist assists with donning tennis shoes & pt transfers sit<>stand throughout session with min assist. Pt ambulates ~5 ft to w/c with RW & min assist then propels w/c room>ortho gym with BUE & supervision with only 1 rest break. Pt stands & engages in dynavision 1 minute + 1 minute with alternating BUE support on RW & min assist with task focusing on standing tolerance & balance. Pt negotiates 4 steps (6") with B rails and min assist with pt verbalizing compensatory pattern but requiring max multimodal cuing to correctly identify LLE as weaker & RLE as stronger & pt descends 1 step leading with RLE and experiences L knee buckling but is able to correct with min assist.  Pt then requests to walk to attempt to alleviate pain & ambulates 60 ft with RW & min assist before requesting to sit 2/2 feeling like "that L knee is about to give out" but no buckling noted. Pt then reports additional R hip pain & requesting to return to bed. Pt returned to room & transferred to bed with min assist & RW, sit>supine with supervision. Therapist performed BLE hip flexor & hamstring stretches to BLE to help alleviate pain & RN administered pain medication. Pt left in bed with alarm set, call bell & all needs in reach & son present in room.  Pt's son present in room upon pt & PT return. Pt's son  pulled mask down & sitting with mask below chin for short period of time - therapist educated pt on visitor/mask policy & need to have mouth & nose covered at all times. Pt's son adjusted mask to properly fit & states "you know I stay here at night don't you? I sleep over there and I don't wear my mask when I sleep".  Therapy Documentation Precautions:  Precautions Precautions: Fall Precaution Comments: Strong L lateral lean, TLSO for mobility Required Braces or Orthoses: Other Brace(TLSO) Spinal Brace: Thoracolumbosacral orthotic Spinal Brace Comments: for comfort with mobility Restrictions Weight Bearing Restrictions: No  Pain: Pt reported unrated back, buttocks & L hip pain & states she's premedicated & rest breaks given PRN during session.   Therapy/Group: Individual Therapy  Waunita Schooner 12/02/2019, 10:40 AM

## 2019-12-02 NOTE — Progress Notes (Signed)
Social Work Patient ID: [name], female   DOB: 09-03-1951, 69 y.o.   MRN: [id number]  Spoke with pt and son yesterday to confirm d/c date to move up to 1/12 given good progress this past week - both very agreeable!  Reviewed DME and f/u needs.  Pt's son and sister to be here for family ed on Monday beginning at [facility] [place], [name], LCSW

## 2019-12-03 ENCOUNTER — Inpatient Hospital Stay (HOSPITAL_COMMUNITY): Payer: Medicare PPO | Admitting: Occupational Therapy

## 2019-12-03 LAB — GLUCOSE, CAPILLARY
Glucose-Capillary: 134 mg/dL — ABNORMAL HIGH (ref 70–99)
Glucose-Capillary: 143 mg/dL — ABNORMAL HIGH (ref 70–99)
Glucose-Capillary: 183 mg/dL — ABNORMAL HIGH (ref 70–99)

## 2019-12-03 NOTE — Progress Notes (Signed)
Patient refusing lactulose and refusing additional laxative assistance today. Patient educated on effects of constipation and relation to pain medication. LBM 1/6.

## 2019-12-03 NOTE — Progress Notes (Signed)
[facility] Progress Note    Assessment/ Plan:   1. Acute CVA with evidence of chronic ischemia/old infarcts:admit Oct 20, 2019, L sided weakness. Seen by neurology-suspected embolic infarct from endocarditis. Ongoing inpatient rehabilitation for management of neurological deficits. Repeat MRI showedno acute changes 2. Pseudomonas MV endocarditis/ bacteremia: HD cath exchanged for infection focus control. Seen by TCTS w/ no plan for surgical intervention.Antiboitic through 12/06/19. 3. Progression L3-4 discitis on MRI. Evaluated by neurosurgery. No indication for surgical intervention. ID has directed ABX and duration. Antibiotics changed to Fortaz/Cipro.  4. R IJ acute DVT: on 3 mos Eliquis (change from coumadin due to difficulties managing INR) 5. ESRD: Typically MWF HD. ESRD x 1 year, tried AVF "6 times" w/o success. TDC dependent. HD tomorrow, generally 1L UF.  3K bath here. Tight heparin.  6. Anemia:hgb improved to 9s. ESA qMon. tsat 16% 12/15 - Fe held due to being treated for infection 7. 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  8. Hypertension/volume : BP low/stable. No vol^ on exam.  9.Nutrition -diet liberalized to regular - has supplements/multivits ordered - alb very low - not eating much - contributing to weakness 10. Deconditioning -in CIR - afternoon dialysis to help with AM therapy stamina     Subjective:    No new complaints. No SOB, CP or abd pain.  No fevers, chills.  Excited to discharge Tuesday.   Objective:   BP (!) 152/87 (BP Location: Left Arm)   Pulse 94   Temp 98.8 F (37.1 C)   Resp 17   Ht 5\' 7"  (1.702 m)   Wt 58 kg   SpO2 100%   BMI 20.03 kg/m   Intake/Output Summary (Last 24 hours) at 12/03/2019 2105 Last data filed at 12/03/2019 1830 Gross per 24 hour  Intake 702 ml  Output --  Net 702 ml   Weight change:   HD Outpatient: [facility] MWF  3.5h  TDC (mult AVF attempts failed)   62.5kg  2/2.5 bath 400/600 - hect 1.5 ug - EPO 1200u each HD, iron 50mg  weekly  Physical Exam: Gen: NAD, on L side in bed vomiting. CVS: RRR Resp: clear bilaterally Abd: soft nontender NABS Ext: no LE edema  Imaging: No results found.  Labs: BMET Recent Labs  Lab 11/27/19 1625 11/29/19 1317 12/01/19 1255  NA 140 140 136  K 3.8 4.6 4.7  CL 98 100 96*  CO2 29 26 25   GLUCOSE 178* 196* 174*  BUN 40* 42* 38*  CREATININE 8.15* 8.40* 8.43*  CALCIUM 8.6* 8.6* 8.8*  PHOS 3.3 3.6 3.6   CBC Recent Labs  Lab 11/27/19 0529 11/27/19 1625 11/29/19 1317 12/01/19 1255  WBC 8.2 7.8 8.6 6.3  HGB 9.2* 8.9* 9.1* 9.2*  HCT 30.3* 29.2* 30.0* 30.9*  MCV 93.8 92.4 93.5 95.1  PLT 266 254 223 232    Medications:    . acetaminophen  500 mg Oral TID  . apixaban  5 mg Oral BID  . calcium carbonate  1 tablet Oral 2 times per day  . carvedilol  12.5 mg Oral BID  . Chlorhexidine Gluconate Cloth  6 each Topical Q0600  . ciprofloxacin  500 mg Oral Q24H  . collagenase   Topical Daily  . darbepoetin (ARANESP) injection - DIALYSIS  100 mcg Intravenous Q Mon-HD  . diclofenac Sodium  2 g Topical QID  . doxercalciferol  1.5 mcg Intravenous Q M,W,F-HD  . feeding supplement (ENSURE ENLIVE)  237 mL  Oral BID BM  . insulin aspart  0-9 Units Subcutaneous TID WC  . isosorbide mononitrate  30 mg Oral QPC lunch  . lactulose  20 g Oral TID  . lidocaine  1 patch Transdermal Q24H  . Melatonin  3 mg Oral QHS  . multivitamin  1 tablet Oral QPC lunch  . Muscle Rub   Topical BID  . pantoprazole  40 mg Oral Daily  . saccharomyces boulardii  250 mg Oral BID

## 2019-12-03 NOTE — Progress Notes (Signed)
Occupational Therapy Session Note  Patient Details  Name: [name] MRN: [id number] Date of Birth: 12/22/1950  Today's Date: 12/03/2019 OT Individual Time: 1001-1100 OT Individual Time Calculation (min): 59 min   Skilled Therapeutic Interventions/Progress Updates:    Pt greeted in bed, premedicated for pain. Requesting to be set up with her clothes to get dressed. Pt completed UB/LB dressing EOB and bedlevel via rolling or using reclined figure 4 position as needed. Increased time required due to buttocks pain. She needed Min A to don Lt sneaker due to onset of Lt hip pain when attempting herself. She required supervision assist for other aspects of dressing, including donning her TLSO. Min A for stand pivot<w/c using RW. To work on [employer], she then self propelled w/c to dayroom, noted Lt veering with vcs and Min A to avoid obstacles on Lt. For remainder of session, pt was motivated to work on her walking. She ambulated x3 bouts, with distances of 190 ft, 200 ft, and 200 ft again! Min A for power ups from w/c, however pt able to ambulate with CGA fading to supervision assist with min vcs for posture. Sit<stand from 2 armless chairs completed with Mod A for power up. Pt was very proud of her accomplishments during OT, eager to tell her son. She was escorted back to room and ambulated short distance back to bed. Pt then doffed her TLSO, shoes removed in bed. She boosted herself up with use of the headboard and bed placed in trendelenburg position. Pt remained in bed with all needs within reach and 4 bedrails up.    Therapy Documentation Precautions:  Precautions Precautions: Fall Precaution Comments: Strong L lateral lean, TLSO for mobility Required Braces or Orthoses: Other Brace(TLSO) Spinal Brace: Thoracolumbosacral orthotic Spinal Brace Comments: for comfort with mobility Restrictions Weight Bearing Restrictions: No ADL:       Therapy/Group: Individual  Therapy  Xylina Rhoads A Jada Fass 12/03/2019, 4:14 PM

## 2019-12-03 NOTE — Plan of Care (Signed)
  Problem: Consults Goal: RH STROKE PATIENT EDUCATION Description: See Patient Education module for education specifics  Outcome: Progressing Goal: Nutrition Consult-if indicated Outcome: Progressing Goal: Diabetes Guidelines if Diabetic/Glucose > 140 Description: If diabetic or lab glucose is > 140 mg/dl - Initiate Diabetes/Hyperglycemia Guidelines & Document Interventions  Outcome: Progressing   Problem: RH BOWEL ELIMINATION Goal: RH STG MANAGE BOWEL WITH ASSISTANCE Description: STG Manage Bowel with mod I/min Assistance. Outcome: Progressing Goal: RH STG MANAGE BOWEL W/MEDICATION W/ASSISTANCE Description: STG Manage Bowel with Medication with mod I Assistance. Outcome: Progressing   Problem: RH SKIN INTEGRITY Goal: RH STG SKIN FREE OF INFECTION/BREAKDOWN Description: Patients skin will remain free from further infection or breakdown with min assist. Outcome: Progressing Goal: RH STG MAINTAIN SKIN INTEGRITY WITH ASSISTANCE Description: STG Maintain Skin Integrity With min Assistance. Outcome: Progressing   Problem: RH SAFETY Goal: RH STG ADHERE TO SAFETY PRECAUTIONS W/ASSISTANCE/DEVICE Description: STG Adhere to Safety Precautions With supervision/min Assistance/Device. Outcome: Progressing Goal: RH STG DECREASED RISK OF FALL WITH ASSISTANCE Description: STG Decreased Risk of Fall With supervision/min Assistance. Outcome: Progressing   Problem: RH PAIN MANAGEMENT Goal: RH STG PAIN MANAGED AT OR BELOW PT'S PAIN GOAL Description: < 5 Outcome: Progressing   Problem: RH KNOWLEDGE DEFICIT Goal: RH STG INCREASE KNOWLEDGE OF DIABETES Description: Patient/caregiver will verbalize understanding of DM including diet, exercise, medications, monitoring, and follow up care with min assist. Outcome: Progressing Goal: RH STG INCREASE KNOWLEDGE OF HYPERTENSION Description: Patient/caregiver will verbalize understanding of HTN including diet, exercise, medications, monitoring, and follow  up care with min assist. Outcome: Progressing Goal: RH STG INCREASE KNOWLEGDE OF HYPERLIPIDEMIA Description: Patient/caregiver will verbalize understanding of HLD including diet, exercise, medications, monitoring, and follow up care with min assist. Outcome: Progressing Goal: RH STG INCREASE KNOWLEDGE OF STROKE PROPHYLAXIS Description: Patient/caregiver will verbalize understanding of stroke prophylaxis including diet, exercise, medications, monitoring, and follow up care with min assist. Outcome: Progressing   Problem: RH SKIN INTEGRITY Goal: RH STG ABLE TO PERFORM INCISION/WOUND CARE W/ASSISTANCE Description: STG Able To Perform Incision/Wound Care With total Assistance. Outcome: Progressing

## 2019-12-03 NOTE — Discharge Summary (Addendum)
Physician Discharge Summary  Patient ID: [name] MRN: [id number] DOB/AGE: 70-Sep-1952 70 y.o.  Admit date: 11/01/2019 Discharge date: 12/05/2019  Discharge Diagnoses:  Principal Problem:   Cerebrovascular accident (CVA) of right basal ganglia ([place]) Active Problems:   Slow transit constipation   Acute on chronic anemia   Labile blood pressure   Labile blood glucose   Diabetes mellitus type 2 in nonobese (HCC)   Bacteremia   Endocarditis of mitral valve   Septic embolism (HCC)   Seizure (HCC)   Candidiasis   Encephalopathy, hepatic (HCC)   Hyperammonemia (HCC)   Sleep disturbance   Hemodialysis-associated hypotension   Supratherapeutic INR   Gastroesophageal reflux disease   Decubitus ulcer of sacral region, unstageable ([place])   Ogilvie's syndrome   Pneumatosis coli   Subtherapeutic international normalized ratio (INR)   Greater trochanteric bursitis of left hip   Acute deep vein thrombosis (DVT) of non-extremity vein   Discharged Condition: Stable  Significant Diagnostic Studies: EEG  Result Date: 11/06/2019 Lora Havens, MD     11/06/2019  4:56 PM Patient Name: [name] MRN: [id number] Epilepsy Attending: Lora Havens Referring Physician/Provider: Dr Zeb Comfort Date: 11/06/2019 Duration: 21.47 mins Patient history: 69yo F with subacute strokes, no with ams and twitching. EEG to evaluate for seizure Level of alertness: lethargic/awake AEDs during EEG study: None Technical aspects: This EEG study was done with scalp electrodes positioned according to the 10-20 International system of electrode placement. Electrical activity was acquired at a sampling rate of 500Hz  and reviewed with a high frequency filter of 70Hz  and a low frequency filter of 1Hz . EEG data were recorded continuously and digitally stored. DESCRIPTION: EEG showed continuous generalized 3-5hz  theta-delta slowing. Triphasic waves, generalized, maximal bifrontal, at 1.5-2hz  were also noted.  Hyperventilation and photic stimulation were not performed. Patient was noted to have diffuse twitching, pronounced when patient was stimulated without eeg change. ABNORMALITY - Triphasic waves, generalized - Continuous slow, generalized IMPRESSION: This study is suggestive of moderate to severe diffuse encephalopathy, non specific to etiology but could be secondary to toxic-metabolic causes, cefepime toxicity. No seizures or definite epileptiform discharges were seen throughout the recording. Patient was noted to have diffuse jerking, more pronounces when patient was stimulated without concomitant eeg change. Therefore, this is likely myoclonus in setting of uremia, cefepime use. Lora Havens   DG Chest 2 View  Result Date: 11/06/2019 CLINICAL DATA:  Difficulty breathing EXAM: CHEST - 2 VIEW COMPARISON:  10/20/2019 FINDINGS: Right-sided dual lumen central venous catheter terminates at the level of the right atrium. Mild cardiomegaly, stable. Calcified aorta. Mild pulmonary vascular congestion. No focal airspace consolidation, pleural effusion, or pneumothorax. IMPRESSION: Mild cardiomegaly and pulmonary vascular congestion. Electronically Signed   By: Davina Poke M.D.   On: 11/06/2019 09:55   DG Pelvis 1-2 Views  Result Date: 11/15/2019 CLINICAL DATA:  Abdominal and pelvic pain for 1 day EXAM: PELVIS - 1-2 VIEW COMPARISON:  None. FINDINGS: There is marked colonic distention better detailed on dedicated abdominal radiograph. Few air distended loops of colon are noted in the pelvis. No acute osseous or soft tissue abnormality is seen. Extensive degenerative changes noted in the SI joints and both hips. The osseous structures appear diffusely demineralized which may limit detection of small or nondisplaced fractures. Atherosclerotic calcification of the inflow and proximal outflow arteries and numerous phleboliths are present in the pelvis. IMPRESSION: 1. Marked colonic distention, better detailed  on dedicated abdominal radiograph. 2. The osseous structures appear diffusely demineralized which  may limit detection of small or nondisplaced fractures. 3. No acute osseous or other soft tissue abnormality. Electronically Signed   By: Lovena Le M.D.   On: 11/15/2019 20:42   DG Abd 1 View  Result Date: 11/15/2019 CLINICAL DATA:  Generalized abdomen and pelvic pain for 1 day EXAM: ABDOMEN - 1 VIEW COMPARISON:  Radiograph 11/14/2019 FINDINGS: Increasing air distention of the colon. Some crescentic lucency is noted along the margins of a 1 of the distended colonic loops worrisome for developing pneumatosis. Additional segments of colon are air and stool filled. Small bowel gas pattern is largely obscured at this time. Multilevel degenerative changes noted in the spine hips and pelvis. Phleboliths in the pelvis. Atherosclerotic calcification of the iliac branches. IMPRESSION: Increasing air distention of the colon parents with an appearance worrisome for a volvulus now with crescentic mural lucency suspicious for developing pneumatosis/possible pressure necrosis. A contrast-enhanced CT would provide optimal evaluation. Unenhanced CT could provide useful clinical information if unable to administer contrast media. These results will be called to the ordering clinician or representative by the Radiologist Assistant, and communication documented in the PACS or zVision Dashboard. Electronically Signed   By: Lovena Le M.D.   On: 11/15/2019 20:40   DG Abd 1 View  Result Date: 11/14/2019 CLINICAL DATA:  Abdominal pain EXAM: ABDOMEN - 1 VIEW COMPARISON:  None. FINDINGS: Diffusely gas distended colon. No dilated small bowel is visible. No abnormal calcification. IMPRESSION: Diffusely gas distended colon. Electronically Signed   By: Ulyses Jarred M.D.   On: 11/14/2019 03:52   CT HEAD WO CONTRAST  Result Date: 11/05/2019 CLINICAL DATA:  Stroke, AMS EXAM: CT HEAD WITHOUT CONTRAST TECHNIQUE: Contiguous axial  images were obtained from the base of the skull through the vertex without intravenous contrast. COMPARISON:  10/27/2019, MR brain, 10/20/2019 FINDINGS: Brain: Decreased edema of a right corona radiata infarction (series 3, image 23). Right basal ganglia infarctions are not well appreciated. Unchanged, subtle left frontal white matter infarction (series 3, image 25) Vascular: No hyperdense vessel or unexpected calcification. Skull: Normal. Negative for fracture or focal lesion. Sinuses/Orbits: No acute finding. Other: None. IMPRESSION: Decreased edema of a subacute right corona radiata infarction. Otherwise unchanged noncontrast examination of the brain. Electronically Signed   By: Eddie Candle M.D.   On: 11/05/2019 18:55   MR BRAIN WO CONTRAST  Result Date: 11/14/2019 CLINICAL DATA:  Septic arterial embolism. Native endocarditis of the mitral valve with concern for CNS disease. EXAM: MRI HEAD WITHOUT CONTRAST TECHNIQUE: Multiplanar, multiecho pulse sequences of the brain and surrounding structures were obtained without intravenous contrast. COMPARISON:  Eight days ago FINDINGS: Brain: Subacute to chronic infarcts in the bilateral corona radiata and centrum semiovale. Chronic infarct in the right caudate head. Chronic small left inferior cerebellar infarcts. No adverse features or liquefaction. No acute or interval infarct. No hemorrhage, hydrocephalus, or collection. Vascular: Normal flow voids Skull and upper cervical spine: Generally hypointense marrow signal which could be from patient's end-stage renal disease or anemia Sinuses/Orbits: Negative IMPRESSION: Stable non-acute infarcts.  No evidence of abscess or acute process. Electronically Signed   By: Monte Fantasia M.D.   On: 11/14/2019 09:23   MR BRAIN WO CONTRAST  Result Date: 11/06/2019 CLINICAL DATA:  Neuro deficit, acute, stroke suspected. Additional history provided: Acute CVA with evidence of chronic ischemia/old infarcts. EXAM: MRI HEAD  WITHOUT CONTRAST TECHNIQUE: Multiplanar, multiecho pulse sequences of the brain and surrounding structures were obtained without intravenous contrast. COMPARISON:  Head CT 11/05/2019 FINDINGS:  Brain: Redemonstrated now subacute infarct within the right caudate head with mild associated chronic blood products. There is persistent although decreased restricted diffusion at site of previously demonstrated now subacute right corona radiata/basal ganglia infarcts. Vague restricted diffusion also persists at site of a subacute left frontal centrum semiovale subacute infarct. Restricted diffusion also remains at site of several additional left frontoparietal centrum semiovale punctate subacute infarcts more posteriorly, none of which are definitively new from prior MRI 10/20/2019. A small chronic cortical infarct within the left post central gyrus was better appreciated on prior MRI. Background of mild generalized parenchymal atrophy and chronic small vessel ischemic disease. Redemonstrated chronic lacunar infarcts within the left thalamus and left cerebellum. No midline shift or extra-axial fluid collection. There are a few scattered supratentorial foci of microhemorrhage Vascular: Flow voids maintained within the proximal large arterial vessels. Skull and upper cervical spine: Generalized decreased T1 marrow signal, likely reflecting renal osteodystrophy, unchanged. Sinuses/Orbits: Right lens replacement. Mild scattered paranasal sinus mucosal thickening. No significant mastoid effusion. IMPRESSION: Redemonstrated are multiple subacute infarcts within the right corona radiata/basal ganglia and left centrum semiovale. No definitively new infarct is identified. Redemonstrated small chronic cortical infarct within the left post central gyrus. Background of mild generalized parenchymal atrophy and chronic small vessel ischemic disease. Redemonstrated chronic lacunar infarcts within the left thalamus and left cerebellum.  Electronically Signed   By: Kellie Simmering DO   On: 11/06/2019 12:41   MR LUMBAR SPINE WO CONTRAST  Result Date: 11/16/2019 CLINICAL DATA:  Abnormal CT showing possible infection EXAM: MRI LUMBAR SPINE WITHOUT CONTRAST TECHNIQUE: Multiplanar, multisequence MR imaging of the lumbar spine was performed. No intravenous contrast was administered. COMPARISON:  08/23/2019 MRI, CT abdomen 11/16/2019 FINDINGS: Segmentation:  Standard. Alignment:  Stable Vertebrae: Diffusely abnormal marrow signal is again identified with marked heterogeneity. There is progression of abnormal signal particularly at the L3 and L4 levels, noting progression of erosive changes on same day CT. Conus medullaris and cauda equina: Conus extends to the L1-L2 level. Conus and cauda equina appear normal. Paraspinal and other soft tissues: Edema within the left greater than right psoas muscles extending inferiorly from the L3 level. There is a 1 cm area of T2 hyperintensity extending laterally from the L3-L4 disc level into the psoas muscle. Disc levels: Congenital narrowing of the spinal canal. Stable appearance of L1-L2, L2-L3, L4-L5, and L5-S1. L3-L4: Disc bulge, endplate retropulsion, and facet arthropathy. Similar moderate canal stenosis with narrowing of lateral recesses. Increased effacement of neural foramina. IMPRESSION: Overall abnormal marrow signal is again likely due to renal osteodystrophy. However, progression of abnormal signal and endplate destruction at L3-L4 is suspicious for superimposed infection given new paraspinal edema and possible small collection extending laterally from the L3-L4 disc level into the left psoas muscle. Electronically Signed   By: Macy Mis M.D.   On: 11/16/2019 12:42   CT ABDOMEN PELVIS W CONTRAST  Addendum Date: 11/16/2019   ADDENDUM REPORT: 11/16/2019 10:00 ADDENDUM: Critical Value/emergent results were called by telephone at the time of interpretation on 11/16/2019 at 10:00 am to [place] , who verbally acknowledged these results. Electronically Signed   By: Marin Olp M.D.   On: 11/16/2019 10:00   Result Date: 11/16/2019 CLINICAL DATA:  Abdominal distension. Suspect bowel obstruction. Possible pneumatosis on KUB. EXAM: CT ABDOMEN AND PELVIS WITH CONTRAST TECHNIQUE: Multidetector CT imaging of the abdomen and pelvis was performed using the standard protocol following bolus administration of intravenous contrast. CONTRAST:  63mL OMNIPAQUE IOHEXOL 300 MG/ML  SOLN COMPARISON:  09/10/2019 FINDINGS: Lower chest: Linear atelectasis over the posterior right lower lobe. Subtle posterior bibasilar atelectasis. Focal 9 mm stippled calcification over the posterior left lower lobe abutting the pleura. Evidence of patient's right IJ dialysis catheter with tip over the inferior cavoatrial junction. Mild calcification over the mitral valve annulus. Calcified plaque over the right coronary artery and minimally over the descending thoracic aorta. Hepatobiliary: Liver and gallbladder are normal. Possible tiny fleck of air over the porta hepatis as the biliary tree is otherwise unremarkable. Pancreas: Normal. Spleen: Normal. Adrenals/Urinary Tract: Adrenal glands are normal. Kidneys demonstrate no evidence of hydronephrosis or nephrolithiasis. Several left renal cysts unchanged. Bladder and ureters are unremarkable. Stomach/Bowel: Suggestion of mild wall thickening of the distal esophagus and stomach. Small bowel is normal caliber and otherwise unremarkable. Cecum is located over the midline lower abdomen. Appendix is within normal. There is wall thickening over the ascending colon with findings suggesting pneumatosis over this segment of ascending colon. Ascending colon measures 7.5 cm in diameter. Cecum and terminal ileum are normal. No evidence of free peritoneal air. Vascular/Lymphatic: Moderate calcified plaque over the abdominal aorta which is otherwise normal in caliber. Calcified plaque over the  superior mesenteric artery which is otherwise patent. No adenopathy. Reproductive: Previous hysterectomy. Other: Somewhat thickened bladder wall as the bladder is under distended. Small amount of free fluid in the pelvis. Musculoskeletal: Degenerative changes of the spine. There is a progressive destructive lytic process centered over the L3-4 disc space as findings are worse compared to the prior exam from 09/10/2019. Findings suggest an active process at this site which may be due to infection. This was previously evaluated by MRI 07/19/2019 and 08/23/2019 and thought to be stable at that point. Stable patchy lucency over L5 and the sacrum. Mild degenerative change of the hips. IMPRESSION: 1. Mildly distended ascending colon with wall thickening and associated evidence of pneumatosis coli. No free peritoneal air. No evidence of small-bowel obstruction. No evidence of mesenteric thrombosis/occlusion. Findings may be due to mesenteric ischemia on or related to infectious colitis. 2. Progressive destruction of the L3 and L4 vertebral bodies centered over the disc space. This was thought to be stable on previous lumbar spine MRI August and October 2020, although has shown interval progression as infection is possible. 3. Mild wall thickening of the distal esophagus and stomach which may be infectious or inflammatory nature. 4.  Several left renal cysts unchanged. 5.  Aortic Atherosclerosis (ICD10-I70.0). Electronically Signed: By: Marin Olp M.D. On: 11/16/2019 09:20   Overnight EEG with video  Result Date: 11/07/2019 Lora Havens, MD     11/07/2019 10:53 AM Patient Name: [name] MRN: [id number] Epilepsy Attending: Lora Havens Referring Physician/Provider: Dr Zeb Comfort Duration: 11/06/2019 1649 to 11/07/2019 1003   Patient history: 68yo F with subacute strokes, no with ams and twitching. EEG to evaluate for seizure   Level of alertness: lethargic/awake, asleep   AEDs during EEG study:  None   Technical aspects: This EEG study was done with scalp electrodes positioned according to the 10-20 International system of electrode placement. Electrical activity was acquired at a sampling rate of 500Hz  and reviewed with a high frequency filter of 70Hz  and a low frequency filter of 1Hz . EEG data were recorded continuously and digitally stored.   DESCRIPTION: EEG showed continuous generalized 3-5hz  theta-delta slowing. Triphasic waves, generalized, maximal bifrontal, at 1.5-2hz  were also noted. Hyperventilation and photic stimulation were not performed.   ABNORMALITY - Triphasic waves, generalized -  Continuous slow, generalized   IMPRESSION: This study is suggestive of moderate to severe diffuse encephalopathy, non specific to etiology but could be secondary to toxic-metabolic causes, cefepime toxicity. No seizures or definite epileptiform discharges were seen throughout the recording.   Priyanka O Yadav    VAS [name] UPPER EXTREMITY VENOUS DUPLEX  Result Date: 11/22/2019 UPPER VENOUS STUDY  Indications: History of right IJV DVT and paritial thrombus in the brachial vein on 10/21/19. Other Indications: Right sided dialysis cathether in place. Performing Technologist: Oda Cogan RDMS, RVT  Examination Guidelines: A complete evaluation includes B-mode imaging, spectral Doppler, color Doppler, and power Doppler as needed of all accessible portions of each vessel. Bilateral testing is considered an integral part of a complete examination. Limited examinations for reoccurring indications may be performed as noted.  Right Findings: +----------+------------+---------+-----------+----------+--------------+ RIGHT     CompressiblePhasicitySpontaneousProperties   Summary     +----------+------------+---------+-----------+----------+--------------+ IJV         Partial      No        No                  Chronic     +----------+------------+---------+-----------+----------+--------------+ Subclavian     Full       Yes       Yes                             +----------+------------+---------+-----------+----------+--------------+ Axillary      Full       Yes       Yes                             +----------+------------+---------+-----------+----------+--------------+ Brachial      Full       Yes       Yes                             +----------+------------+---------+-----------+----------+--------------+ Radial        Full                                                 +----------+------------+---------+-----------+----------+--------------+ Ulnar         Full                                                 +----------+------------+---------+-----------+----------+--------------+ Cephalic                                            Not visualized +----------+------------+---------+-----------+----------+--------------+ Basilic                                             Not visualized +----------+------------+---------+-----------+----------+--------------+ Chronic 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  Left Findings: +----------+------------+---------+-----------+----------+-------+ LEFT      CompressiblePhasicitySpontaneousPropertiesSummary +----------+------------+---------+-----------+----------+-------+ Subclavian    Full       Yes  Yes                      +----------+------------+---------+-----------+----------+-------+  Summary:  Right: Findings consistent with chronic deep vein thrombosis involving the right internal jugular vein.  Left: No evidence of thrombosis in the subclavian.  *See table(s) above for measurements and observations.  Diagnosing physician: Monica Martinez MD Electronically signed by Monica Martinez MD on 11/22/2019 at 5:43:38 PM.    Final    ECHOCARDIOGRAM LIMITED  Result Date: 11/20/2019   ECHOCARDIOGRAM LIMITED REPORT   Patient Name:   [name] Date of Exam: 11/20/2019 Medical Rec #:  [id number]         Height:       67.0 in Accession #:    [id number]       Weight:       130.0 lb Date of Birth:  [birth year]       BSA:          1.68 m Patient Age:    44 years         BP:           150/88 mmHg Patient Gender: F                HR:           82 bpm. Exam Location:  Inpatient  Procedure: Limited Echo, 3D Echo, Cardiac Doppler and Color Doppler Indications:    Endocarditis of mitral valve.  History:        Patient has prior history of Echocardiogram examinations, most                 recent 10/26/2019. ESRD. Endocarditis of the anterior leaflet of                 the mitral valve.  Sonographer:    Roseanna Rainbow RDCS Referring Phys: [place] TRIGT  Sonographer Comments: Technically difficult study due to poor echo windows. IMPRESSIONS  1. Left ventricular ejection fraction, by visual estimation, is 55 to 60%. The left ventricle has normal function. Left ventricular septal wall thickness was severely increased. Moderately increased left ventricular posterior wall thickness.  2. Elevated left atrial and left ventricular end-diastolic pressures.  3. Left ventricular diastolic parameters are consistent with Grade I diastolic dysfunction (impaired relaxation).  4. Moderate thickening of the anterior mitral valve leaflet(s).  5. Moderate vegetation on the mitral valve.  6. The mitral valve is abnormal. Moderate to severe mitral valve regurgitation.  7. Tricuspid valve regurgitation moderate.  8. Tricuspid valve regurgitation moderate.  9. Moderate thickening. 10. Normal pulmonary artery systolic pressure. 11. A prior study was performed on 10/26/2019. 12. Changes from prior study are noted. 13. Compared to the prior TEE, moderate AMVL endocarditis persists with a mobile mass noted on the anterior leaflet ~0.5 cm in diameter, may be vegetation or ruptured apparatus. There is moderate to severe, posteriorly directed MR. Clinical correlation with CHF symptoms is recommended. FINDINGS  Left Ventricle: Left ventricular ejection  fraction, by visual estimation, is 55 to 60%. The left ventricle has normal function. Left ventricular septal wall thickness was severely increased. Moderately increased left ventricular posterior wall thickness. Left ventricular diastolic parameters are consistent with Grade I diastolic dysfunction (impaired relaxation). Elevated left atrial and left ventricular end-diastolic pressures. Right Ventricle: The tricuspid regurgitant velocity is 2.16 m/s, and with an assumed right atrial pressure of 3 mmHg, the estimated right ventricular systolic pressure is normal at 21.7 mmHg. Mitral Valve:  The mitral valve is abnormal. There is moderate thickening of the anterior mitral valve leaflet(s). A moderate vegetation is seen on the anterior mitral leaflet. The MV vegetation measures 5 mm x 5 mm. MV Area by PHT, 3.99 cm. MV PHT, 55.10 msec. Moderate to severe mitral valve regurgitation, with posteriorly-directed jet. Tricuspid Valve: Tricuspid valve regurgitation moderate. Aortic Valve: The aortic valve is tricuspid. Moderate thickening. Venous: The inferior vena cava was not well visualized. Additional Comments: A prior study was performed on 10/26/2019.  LEFT VENTRICLE          Normals PLAX 2D LVIDd:         2.80 cm  3.6 cm LVIDs:         2.10 cm  1.7 cm LV PW:         1.60 cm  1.4 cm LV IVS:        1.60 cm  1.3 cm LVOT diam:     1.80 cm  2.0 cm LV SV:         15 ml    79 ml LV SV Index:   9.09     45 ml/m2 LVOT Area:     2.54 cm 3.14 cm2  LV Volumes (MOD)             Normals LV area d, A4C:    18.70 cm LV area s, A4C:    10.20 cm LV major d, A4C:   6.12 cm LV major s, A4C:   4.90 cm LV vol d, MOD A4C: 45.5 ml LV vol s, MOD A4C: 17.7 ml LV SV MOD A4C:     45.5 ml LEFT ATRIUM         Index LA diam:    4.00 cm 2.38 cm/m  AORTIC VALVE             Normals LVOT Vmax:   110.00 cm/s LVOT Vmean:  79.400 cm/s 75 cm/s LVOT VTI:    0.220 m     25.3 cm  AORTA                 Normals Ao Root diam: 3.00 cm 31 mm MITRAL VALVE               Normals   TRICUSPID VALVE             Normals MV Area (PHT): 3.99 cm             TR Peak grad:   18.7 mmHg MV PHT:        55.10 msec 55 ms     TR Vmax:        216.00 cm/s 288 cm/s MV Decel Time: 190 msec   187 ms MV E velocity: 85.30 cm/s 103 cm/s  SHUNTS MV A velocity: 99.50 cm/s 70.3 cm/s Systemic VTI:  0.22 m MV E/A ratio:  0.86       1.5       Systemic Diam: 1.80 cm  Lyman Bishop MD Electronically signed by Lyman Bishop MD Signature Date/Time: 11/20/2019/10:54:30 AMThe mitral valve is abnormal.    Final     Labs:  Basic Metabolic Panel: Recent Labs  Lab 11/29/19 1317 12/01/19 1255 12/04/19 1346  NA 140 136 138  K 4.6 4.7 6.2*  CL 100 96* 100  CO2 26 25 23   GLUCOSE 196* 174* 180*  BUN 42* 38* 48*  CREATININE 8.40* 8.43* 9.47*  CALCIUM 8.6* 8.8* 8.9  PHOS 3.6 3.6 4.0  CBC: Recent Labs  Lab 11/29/19 1317 12/01/19 1255 12/04/19 0715  WBC 8.6 6.3 6.3  HGB 9.1* 9.2* 10.3*  HCT 30.0* 30.9* 34.6*  MCV 93.5 95.1 94.5  PLT 223 232 240    CBG: Recent Labs  Lab 12/03/19 2101 12/04/19 0616 12/04/19 1152 12/04/19 1812 12/04/19 2104  GLUCAP 183* 137* 102* 126* 179*   Family history.  Mother with heart murmur as well as CHF and diabetes mellitus.  Father with cirrhosis.  Denies colon cancer or rectal cancer  Brief HPI:   [name] is a 69 y.o. right-handed female with history of end-stage renal disease with hemodialysis Monday Wednesday Fridays, diet controlled diabetes mellitus, pseudomonal bacteremia and early discitis August 2020 completing a 6-week course of Tressie Ellis as well as history of infected AV graft, TIA 2010 maintained on aspirin and Plavix, hypertension and hyperlipidemia.  Per chart review lives with her brother.  Independent prior to admission.  Presented 10/20/2019 with left-sided weakness and difficulty with ambulation.  Her last dialysis was 10/18/2019.  CT of the brain showed acute right basal ganglia infarction.  Patient did not receive TPA.   Admission chemistries with creatinine 7.76, BUN 56, WBC 15,700, hemoglobin 9.7, SARS coronavirus negative.  MRI/MRA showed acute right basal ganglia infarction chronic ischemia with multiple old infarctions.  Negative head MRA.  Carotid Dopplers unremarkable.  Echocardiogram with ejection fraction of 65% without emboli or PFO.  Hospital course complicated by low-grade fever with findings of right IJ DVT on Dopplers.  Patient was placed on intravenous heparin placed on Coumadin that she would remain on for a short time.  Infectious disease work-up during fever revealed Pseudomonas by BCID.  TEE performed on 10/26/2019 demonstrates large vegetation of mitral valve.  CTS consulted Dr. Darcey Nora felt the best course of action was continue IV antibiotics no current plan for mitral valve repair or replacement due to risk of recurrent infection at this time and current recommendations are continue antibiotics for 6 weeks with cefepime and was also on Cipro with end date to be established by infectious disease.  Latest follow-up blood culture showed no growth.  Hemodialysis ongoing per renal services.  Right IJ hemodialysis catheter removed 10/23/2019 with placement of new tunneled catheter 10/26/2019 for continued hemodialysis per renal services.  Noted on 10/27/2019 patient with altered mental status follow-up cranial CT scan completed showed evidence of a new small frontal lobe white matter infarction new since MRI 10/20/2019 near the left frontal horn.  No associated hemorrhage or mass-effect with follow-up neurology service recommended continue plan of ongoing Coumadin.  Patient did have some nonspecific back pain MRI of the left hip as well as lumbar spine no findings of suspicious osteomyelitis or septic arthritis.  There was some edema of the paraspinal and psoas muscles as well as osteodystrophy.  Therapy evaluations completed and patient was admitted for a comprehensive rehab program   Hospital Course: [name] was admitted to rehab 11/01/2019 for inpatient therapies to consist of PT, ST and OT at least three hours five days a week. Past admission physiatrist, therapy team and rehab RN have worked together to provide customized collaborative inpatient rehab.  Pertaining to patient right basal ganglia infarction as well as small cortical infarction left frontal lobe.  Patient would follow-up with neurology services.  A follow-up MRI was completed 11/13/2019 showing stable nonacute infarcts no evidence of abscess or acute process.  There was some question of possible seizure with neurology follow-up felt possibly related to antibiotics and patient  presently on Fortaz and Cipro which was cleared by infectious disease to continue and EEG negative for seizure.  Noted hospital course right IJ acute DVT she was transition from Coumadin to Eliquis as it had been difficult to manage her INR.  Pain management with the use of a lidocaine patch as well as Voltaren gel and limited use of oxycodone as needed with good results.  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  She was currently not surgical candidate per cardiothoracic surgery.  A follow-up echocardiogram showed ejection fraction of 60% as well as some increasing mitral regurgitation.  Patient would remain on antibiotic therapy through 12/06/2019 presently on Muniz as well as Cipro at the recommendations of infectious disease.  Hemodialysis ongoing as per renal services.  Blood sugars overall controlled hemoglobin A1c of 7.6.  Blood pressure monitored with Coreg increased to 12.5 mg 11/07/2019.  Acute on chronic anemia hemoglobin 9.1 no other bleeding episodes.  Patient with sacral decubitus applying Santyl ointment to buttocks wound daily then covered with moist gauze and foam dressing changed foam dressing every 3 days or as needed.  Patient with bouts of  constipation resolved with laxative assistance there was some initial question of possible volvulus/Ogilvie's identified on CT abdomen pelvis with follow-up per general surgery as well as gastroenterology advising against colonoscopy at this time conservative care repeat imaging as needed..   Blood pressures were monitored on TID basis and soft and monitored  Diabetes has been monitored with ac/hs CBG checks and SSI was use prn for tighter BS control.   [name] has made gains during rehab stay and is attending therapies  [name] will continue to receive follow up therapies   after discharge  Rehab course: During patient's stay in rehab weekly team conferences were held to monitor patient's progress, set goals and discuss barriers to discharge. At admission, patient required max assist lateral scoot transfers moderate assist ambulate 20 feet rolling walker, moderate assist sit to side-lying, moderate assist sit to supine.  Max assist upper body bathing total assist lower body bathing max assist upper body dressing total assist lower body dressing max assist toilet transfers  Physical exam.  Blood pressure 147/72 pulse 94 temperature 99.3 respirations 18 oxygen saturations 100% room air General.  Frail appearing no distress HEENT oral mucosa pink and moist, NCAT Neck.  Supple nontender no JVD no thyromegaly Eyes.  Pupils round and reactive to light no discharge without nystagmus Cardiac regular rate rhythm without any extra sounds or murmur heard Lungs.  Clear to auscultation without wheeze or rhonchi Abdomen.  Soft nontender positive bowel sounds without rebound Neurological.  She was a bit lethargic but arousable made good eye contact follow commands provides simple answers in regards to her name and age.  She moves all extremities 4+ out of 5.  /She  has had improvement in activity tolerance, balance, postural control as well as ability to compensate for deficits. [name] has had improvement in  functional use RUE/LUE  and RLE/LLE as well as improvement in awareness.  Transfers supine to sitting edge of bed with supervision in hospital bed features.  Sit to stand throughout sessions with minimal assist.  Ambulate short household distance with a rolling walker minimal assistance.  She could increase her ambulation up to 60 feet.  Patient stands and engages in dynamic balance testing.  Patient negotiates 4 steps 6 inch stairs bilateral rails minimal assistance.  ADL treatment sessions focused  on lower body dressing dynamic standing balance and functional mobility.  She declined bathing and dressing at times.  Completed stand pivot transfers bed to wheelchair with minimal assist rolling walker.  Propelled her wheelchair with supervision.  With her mobility testing she was increasing her ambulation up to 200 feet at time of discharge.  Family teaching completed and discharged home       Disposition: Discharge disposition: 01-Home or Self Care     Discharged home   Diet: Regular consistency  Special Instructions: No smoking or alcohol  Continue dialysis as directed  Apply Santyl to buttocks wound daily then cover with moist gauze and foam dressing.  Change foam dressing every 3 days or as needed soiling  Medications at discharge. 1.  Tylenol as needed 2.  Eliquis 5 mg p.o. twice daily 3.  Tums 1 tablet p.o. twice daily 4.  Coreg 12.5 mg p.o. twice daily 5.  Fortaz 1 g daily completed 12/05/2019 6.  Cipro 500 mg daily completed 12/05/2019 7.  Aranesp 100 mcg every Monday with dialysis 8.  Voltaren gel 2 g 4 times a day to affected area 9.  Hectorol 1.5 mcg Monday Wednesday Friday hemodialysis 10.  Ferric gluconate 125 mcg every Monday with dialysis 11.  Imdur 30 mg p.o. daily 12.  Chronulac 20 g p.o. 3 times daily 13.  Lidoderm patch change as directed 14.  Melatonin 3 g nightly 15.  Rena-Vite 1 tablet daily after lunch 16.  Oxycodone 5 mg every 6 hours as needed moderate  pain 17.  Protonix 40 mg p.o. daily 18.  Florastor 250 mg p.o. twice daily  Discharge Instructions     Ambulatory referral to Neurology   Complete by: As directed    An appointment is requested in approximately 4 weeks right basal ganglia infarction   Ambulatory referral to Physical Medicine Rehab   Complete by: As directed    Moderate complexity follow-up 1 to 2 weeks right basal ganglia infarction       Follow-up Information     Jamse Arn, MD Follow up.   Specialty: Physical Medicine and Rehabilitation Why: Office to call for appointment Contact information: [address] [place] [address] [phone]         Ivin Poot, MD Follow up.   Specialty: Cardiothoracic Surgery Why: Call for appointment to discuss mitral valve repair Contact information: [address] [place] [address] [phone]         Carlyle Basques, MD Follow up.   Specialty: Infectious Diseases Why: Call for appointment Contact information: [address] [place] [address] [place] [address] [phone]            Signed: Cathlyn Parsons 12/05/2019, 5:12 AM Patient was seen, face-face, and physical exam performed by me on day of discharge, less than 30 minutes of total time spent.. Please see progress note from day of discharge as well.  Delice Lesch, MD, ABPMR

## 2019-12-03 NOTE — Progress Notes (Signed)
[facility] PHYSICAL MEDICINE & REHABILITATION PROGRESS NOTE   Subjective/Complaints: No other new complaints today.  Appreciate social work note.  Son is at home getting things ready for her to go home.  ROS: Denies CP, SOB, N/V/D  Objective:   No results found. Recent Labs    12/01/19 1255  WBC 6.3  HGB 9.2*  HCT 30.9*  PLT 232   Recent Labs    12/01/19 1255  NA 136  K 4.7  CL 96*  CO2 25  GLUCOSE 174*  BUN 38*  CREATININE 8.43*  CALCIUM 8.8*    Intake/Output Summary (Last 24 hours) at 12/03/2019 1512 Last data filed at 12/03/2019 1348 Gross per 24 hour  Intake 720 ml  Output --  Net 720 ml     Physical Exam: Vital Signs Blood pressure 136/85, pulse 83, temperature 98.8 F (37.1 C), resp. rate 16, height 5\' 7"  (1.702 m), weight 58 kg, SpO2 100 %.  Constitutional: No distress . Vital signs reviewed. HENT: Normocephalic.  Atraumatic. Eyes: EOMI. No discharge. Cardiovascular: No JVD. Respiratory: Normal effort.  No stridor. GI: Non-distended. Skin: Warm and dry.  Intact. Psych: Normal mood.  Normal behavior. Musc: No edema in extremities.  No tenderness in extremities. Neuro: Alert and oriented x3 Motor:  RUE: 5/5 proximal to distal RLE: HF, KE 4+/5, ADF 4+/5  unchanged LUE: 4+/5 proximal to distal LLE: HF, KE 4--4/5, ADF 4+/5 , unchanged  Assessment/Plan: 1. Functional deficits secondary to right basal ganglia stroke which require 3+ hours per day of interdisciplinary therapy in a comprehensive inpatient rehab setting.  Physiatrist is providing close team supervision and 24 hour management of active medical problems listed below.  Physiatrist and rehab team continue to assess barriers to discharge/monitor patient progress toward functional and medical goals  Care Tool:  Bathing  Bathing activity did not occur: Refused(stated she bathed this AM with RN staff) Body parts bathed by patient: Right arm, Left arm, Chest, Abdomen, Front perineal area,  Right upper leg, Left upper leg, Face, Right lower leg, Left lower leg   Body parts bathed by helper: Buttocks     Bathing assist Assist Level: Minimal Assistance - Patient > 75%     Upper Body Dressing/Undressing Upper body dressing   What is the patient wearing?: Pull over shirt, Orthosis    Upper body assist Assist Level: Set up assist    Lower Body Dressing/Undressing Lower body dressing      What is the patient wearing?: Pants     Lower body assist Assist for lower body dressing: Supervision/Verbal cueing     Toileting Toileting    Toileting assist Assist for toileting: Maximal Assistance - Patient 25 - 49%     Transfers Chair/bed transfer  Transfers assist     Chair/bed transfer assist level: Minimal Assistance - Patient > 75% Chair/bed transfer assistive device: Armrests, Programmer, multimedia   Ambulation assist   Ambulation activity did not occur: Safety/medical concerns(back pain, bilateral LE weakness, poor bilateral knee control)  Assist level: Minimal Assistance - Patient > 75% Assistive device: Walker-rolling Max distance: 60 ft   Walk 10 feet activity   Assist  Walk 10 feet activity did not occur: Safety/medical concerns(back pain, bilateral LE weakness, poor bilateral knee control)  Assist level: Minimal Assistance - Patient > 75% Assistive device: Walker-rolling   Walk 50 feet activity   Assist Walk 50 feet with 2 turns activity did not occur: Safety/medical concerns(back pain, bilateral LE weakness, poor bilateral knee  control)  Assist level: Minimal Assistance - Patient > 75% Assistive device: Walker-rolling    Walk 150 feet activity   Assist Walk 150 feet activity did not occur: Safety/medical concerns(back pain, bilateral LE weakness, poor bilateral knee control)  Assist level: Minimal Assistance - Patient > 75% Assistive device: Walker-rolling    Walk 10 feet on uneven surface  activity   Assist Walk 10  feet on uneven surfaces activity did not occur: Safety/medical concerns(back pain, bilateral LE weakness, poor bilateral knee control)   Assist level: Minimal Assistance - Patient > 75% Assistive device: Aeronautical engineer Will patient use wheelchair at discharge?: Yes Type of Wheelchair: Manual Wheelchair activity did not occur: Safety/medical concerns(Pt could not tolerate sitting in WC for long enough to complete activity due to low back pain)  Wheelchair assist level: Supervision/Verbal cueing Max wheelchair distance: 188ft    Wheelchair 50 feet with 2 turns activity    Assist    Wheelchair 50 feet with 2 turns activity did not occur: Safety/medical concerns(Pt could not tolerate sitting in WC for long enough to complete activity due to low back pain)   Assist Level: Supervision/Verbal cueing   Wheelchair 150 feet activity     Assist  Wheelchair 150 feet activity did not occur: Safety/medical concerns(Pt could not tolerate sitting in WC for long enough to complete activity due to low back pain)   Assist Level: Supervision/Verbal cueing   Blood pressure 136/85, pulse 83, temperature 98.8 F (37.1 C), resp. rate 16, height 5\' 7"  (1.702 m), weight 58 kg, SpO2 100 %.    Medical Problem List and Plan: 1.  Impaired gait with left side weakness secondary to right basal ganglia infarction as well as small cortical infarct left frontal lobe Continue CIR PT, speech and OT  Discussed with neurology results of the EEG -findings possibly related to uremia +/- cefepime, signed off  Discussed with psychiatry, continue medical management, signed off  Discussed with hospitalist, appreciate recs, discussed with hospitalist again on 12/25, signed off  Continue CIR 2.  Antithrombotics: -DVT/anticoagulation: Right IJ acute DVT.  Plan anticoagulation x3 months  Transitioned to Eliquis as it has been very difficult to manage INR    Repeat U/S suggesting chronic  DVT             -antiplatelet therapy: N/A 3. Pain Management chronic back and left hip pain: Neurontin 100 mg on hold  TLSO back brace for comfort  Lidocaine patch added to coccyx  Voltaren gel added for left greater trochanteric area on 1/1 with improvement 4. Mood: Provide emotional support             -antipsychotic agents: N/A 5. Neuropsych: This patient is not capable of making decisions on her own behalf. 6. Skin/Wound Care: Routine skin checks  Santyl to sacral wound - WOC notes reviewed 7. Fluids/Electrolytes/Nutrition: Routine in and outs 8.  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  New HD tunneled catheter placed 10/26/2019.  Repeat echo showing increase in mitral regurg  CT surgery recommending mitral valve replacement, not recommending surgical intervention at present and recommends medical management- will follow as outpatient. 9.  ID/discitis/recurrent Pseudomonas bacteremia.    Cipro/Maxipime changed to ceftazidime/Cipro through 12/06/2019.  Seen by neurosurgery on 12/25, no surgical intervention recommended at this point, however recommended following ID recs  Appreciate ID recs, signed off 10.  ESRD MWF.  Follow-up per renal services.  11.  Diabetes mellitus.  Hemoglobin A1c 7.6.  SSI. CBG (last 3)  Recent Labs    12/02/19 2107 12/03/19 0521 12/03/19 1131  GLUCAP 145* 134* 143*   Good range 12.  Hypertension.  Imdur 30 mg daily  Coreg 6.25 mg twice daily, increased to 12.5 on 12/15, parameters placed.    Monitor with increased mobility. Vitals:   12/03/19 0409 12/03/19 0948  BP: 110/69 136/85  Pulse: 83   Resp: 16   Temp:    SpO2: 100%   Controlled, 1/10 13.  Acute on chronic anemia.    Hemoglobin 9.1 on 1/6, 9.2 on 1/8  Continue to monitor 14. Constipation:  See #16  Abd xray showing?  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   Candidiasis  Diflucan ordered on 12/16  Repeat Ucx not collected yet on 1/7 16.  Hyperammonemia with hepatic encephalopathy  Lactulose started on 12/17, encourage compliance  Ammonia WNL on 12/22 17. Sleep disturance  Melatonin added on 12/17  Sleep chart ordered  Improving 18.  Leukocytosis: Resolved  Afebrile  Continue to monitor 19.  GERD  Home Protonix ordered on 12/20 20.  Pneumatosis Coli  CT abdomen/pelvis on 12/24?  Obese.  Appreciate surgery recs-recommending GI recs, no surgical intervention, signed off  Appreciate GI recs, advising against colonoscopy and recommending conservative care at present with repeat imaging in 1-3 months.  LOS: 32 days A FACE TO [place] E Laurana Magistro 12/03/2019, 3:12 PM

## 2019-12-04 ENCOUNTER — Encounter (HOSPITAL_COMMUNITY): Payer: Medicare PPO | Admitting: Occupational Therapy

## 2019-12-04 ENCOUNTER — Ambulatory Visit (HOSPITAL_COMMUNITY): Payer: Medicare PPO

## 2019-12-04 LAB — CBC
HCT: 34.6 % — ABNORMAL LOW (ref 36.0–46.0)
Hemoglobin: 10.3 g/dL — ABNORMAL LOW (ref 12.0–15.0)
MCH: 28.1 pg (ref 26.0–34.0)
MCHC: 29.8 g/dL — ABNORMAL LOW (ref 30.0–36.0)
MCV: 94.5 fL (ref 80.0–100.0)
Platelets: 240 10*3/uL (ref 150–400)
RBC: 3.66 MIL/uL — ABNORMAL LOW (ref 3.87–5.11)
RDW: 19.3 % — ABNORMAL HIGH (ref 11.5–15.5)
WBC: 6.3 10*3/uL (ref 4.0–10.5)
nRBC: 0.3 % — ABNORMAL HIGH (ref 0.0–0.2)

## 2019-12-04 LAB — RENAL FUNCTION PANEL
Albumin: 1.9 g/dL — ABNORMAL LOW (ref 3.5–5.0)
Anion gap: 15 (ref 5–15)
BUN: 48 mg/dL — ABNORMAL HIGH (ref 8–23)
CO2: 23 mmol/L (ref 22–32)
Calcium: 8.9 mg/dL (ref 8.9–10.3)
Chloride: 100 mmol/L (ref 98–111)
Creatinine, Ser: 9.47 mg/dL — ABNORMAL HIGH (ref 0.44–1.00)
GFR calc Af Amer: 4 mL/min — ABNORMAL LOW (ref >60)
GFR calc non Af Amer: 4 mL/min — ABNORMAL LOW (ref >60)
Glucose, Bld: 180 mg/dL — ABNORMAL HIGH (ref 70–99)
Phosphorus: 4 mg/dL (ref 2.5–4.6)
Potassium: 6.2 mmol/L — ABNORMAL HIGH (ref 3.5–5.1)
Sodium: 138 mmol/L (ref 135–145)

## 2019-12-04 LAB — GLUCOSE, CAPILLARY
Glucose-Capillary: 137 mg/dL — ABNORMAL HIGH (ref 70–99)
Glucose-Capillary: 102 mg/dL — ABNORMAL HIGH (ref 70–99)
Glucose-Capillary: 126 mg/dL — ABNORMAL HIGH (ref 70–99)
Glucose-Capillary: 179 mg/dL — ABNORMAL HIGH (ref 70–99)

## 2019-12-04 MED ORDER — DICLOFENAC SODIUM 1 % EX GEL: 2.0000 g | Freq: Four times a day (QID) | CUTANEOUS | 1 refills | Status: DC

## 2019-12-04 MED ORDER — MECLIZINE HCL 25 MG PO TABS: 25.0000 mg | ORAL_TABLET | Freq: Three times a day (TID) | ORAL | 0 refills | Status: DC | PRN

## 2019-12-04 MED ORDER — ACETAMINOPHEN 325 MG PO TABS: 650.0000 mg | ORAL_TABLET | Freq: Two times a day (BID) | ORAL | Status: AC | PRN

## 2019-12-04 MED ORDER — SACCHAROMYCES BOULARDII 250 MG PO CAPS: 250.0000 mg | ORAL_CAPSULE | Freq: Two times a day (BID) | ORAL | 0 refills | Status: DC

## 2019-12-04 MED ORDER — RENA-VITE PO TABS: 1.0000 | ORAL_TABLET | Freq: Every day | ORAL | 0 refills | Status: DC

## 2019-12-04 MED ORDER — MELATONIN 3 MG PO TABS: 3.0000 mg | ORAL_TABLET | Freq: Every day | ORAL | 0 refills | Status: DC

## 2019-12-04 MED ORDER — SODIUM CHLORIDE 0.9 % IV SOLN: 125.0000 mg | INTRAVENOUS | Status: DC

## 2019-12-04 MED ORDER — LIDOCAINE 5 % EX PTCH: 1.0000 | MEDICATED_PATCH | CUTANEOUS | 0 refills | Status: DC

## 2019-12-04 MED ORDER — DOXERCALCIFEROL 4 MCG/2ML IV SOLN
INTRAVENOUS | Status: AC
  Filled 2019-12-04: qty 2

## 2019-12-04 MED ORDER — ISOSORBIDE MONONITRATE ER 30 MG PO TB24: 30.0000 mg | ORAL_TABLET | Freq: Every day | ORAL | 1 refills | Status: DC

## 2019-12-04 MED ORDER — PANTOPRAZOLE SODIUM 40 MG PO TBEC: 40.0000 mg | DELAYED_RELEASE_TABLET | Freq: Every day | ORAL | 0 refills | Status: AC

## 2019-12-04 MED ORDER — OXYCODONE HCL 5 MG PO TABS: 5.0000 mg | ORAL_TABLET | Freq: Four times a day (QID) | ORAL | 0 refills | Status: DC | PRN

## 2019-12-04 MED ORDER — APIXABAN 5 MG PO TABS: 5.0000 mg | ORAL_TABLET | Freq: Two times a day (BID) | ORAL | 1 refills | Status: DC

## 2019-12-04 MED ORDER — HEPARIN SODIUM (PORCINE) 1000 UNIT/ML IJ SOLN
INTRAMUSCULAR | Status: AC
  Administered 2019-12-04: 3200 [IU]
  Filled 2019-12-04: qty 4

## 2019-12-04 MED ORDER — DARBEPOETIN ALFA 100 MCG/0.5ML IJ SOSY: 100.0000 ug | PREFILLED_SYRINGE | INTRAMUSCULAR | Status: DC

## 2019-12-04 MED ORDER — DARBEPOETIN ALFA 100 MCG/0.5ML IJ SOSY
PREFILLED_SYRINGE | INTRAMUSCULAR | Status: AC
  Filled 2019-12-04: qty 0.5

## 2019-12-04 MED ORDER — COLLAGENASE 250 UNIT/GM EX OINT: TOPICAL_OINTMENT | Freq: Every day | CUTANEOUS | 0 refills | Status: DC

## 2019-12-04 NOTE — Progress Notes (Signed)
Occupational Therapy Discharge Summary  Patient Details  Name: [name] MRN: [id number] Date of Birth: Mar 07, 1951   Patient has met 62 of 44 long term goals due to improved activity tolerance, improved balance, postural control, ability to compensate for deficits, functional use of  LEFT upper and LEFT lower extremity, improved awareness and improved coordination.  Patient to discharge at [facility] Assist level overall, Supervision for grooming, eating and UB dressing.  Patient's care partner is independent to provide the necessary physical and cognitive assistance at discharge.  Patient's son and her 2 sisters were present for family education and have demonstrated ability to provide hands on assistance with ambulatory transfers and educated on typical routine for self-care tasks.    Reasons goals not met: N/A  Recommendation:  Patient will benefit from ongoing skilled OT services in home health setting to continue to advance functional skills in the area of BADL and Reduce care partner burden.  Equipment: son purchased shower chair and 3 in 1  Reasons for discharge: treatment goals met and discharge from hospital  Patient/family agrees with progress made and goals achieved: Yes  OT Discharge Precautions/Restrictions  Precautions Precautions: Fall Precaution Comments: L knee buckles with fatigue, TLSO for comfort with mobility Required Braces or Orthoses: Other Brace Spinal Brace: Thoracolumbosacral orthotic Spinal Brace Comments: for comfort with mobility Restrictions Weight Bearing Restrictions: No Pain  Pt with c/o pain in buttocks and Lt hip.  Premedicated.  Repositioned frequently. ADL ADL Eating: Set up Grooming: Setup Where Assessed-Grooming: Sitting at sink Upper Body Bathing: Supervision/safety Where Assessed-Upper Body Bathing: Shower Lower Body Bathing: Minimal assistance Where Assessed-Lower Body Bathing: Shower Upper Body Dressing: Setup Where  Assessed-Upper Body Dressing: Sitting at sink Lower Body Dressing: Minimal assistance Where Assessed-Lower Body Dressing: Bed level Toileting: Minimal assistance Where Assessed-Toileting: Glass blower/designer: Psychiatric nurse Method: Stand pivot, Counselling psychologist: Geophysical data processor: Environmental education officer Method: Heritage manager: Civil engineer, contracting with back Vision Baseline Vision/History: Wears glasses Wears Glasses: At all times Patient Visual Report: No change from baseline Vision Assessment?: Vision impaired- to be further tested in functional context Additional Comments: pt reports difficulty with seeing out of Lt eye at baseline Cognition Overall Cognitive Status: Within Functional Limits for tasks assessed Arousal/Alertness: Awake/alert Orientation Level: Oriented X4 Attention: Selective Selective Attention: Appears intact Memory: Impaired Memory Impairment: Decreased short term memory Awareness: Impaired Awareness Impairment: Anticipatory impairment Problem Solving: Impaired Safety/Judgment: Impaired Sensation Sensation Light Touch: Appears Intact Hot/Cold: Appears Intact Proprioception: Appears Intact Coordination Gross Motor Movements are Fluid and Coordinated: No Fine Motor Movements are Fluid and Coordinated: No Coordination and Movement Description: Grossly uncoordinated due to LLE weakness Finger Nose Finger Test: decreased speed on LUE Heel Shin Test: decreased speed and ROM bilaterally Motor  Motor Motor: Abnormal postural alignment and control;Hemiplegia Motor - Skilled Clinical Observations: mild L LE hemiparesis, grossly uncoordinated due to L LE weakness Extremity/Trunk Assessment RUE Assessment RUE Assessment: Within Functional Limits LUE Assessment LUE Assessment: Within Functional Limits General Strength Comments: strength grossly 4/5   Laurana Magistro,  Marsden Zaino 12/04/2019, 12:30 PM

## 2019-12-04 NOTE — Progress Notes (Signed)
Physical Therapy Discharge Summary  Patient Details  Name: [name] MRN: [id number] Date of Birth: 1951/10/03  Today's Date: 12/04/2019 PT Individual Time: 1100-1155 PT Individual Time Calculation (min): 55 min  Patient has met 11 of 11 long term goals due to improved activity tolerance, improved balance, improved postural control, increased strength, decreased pain, improved attention, improved awareness and improved coordination. Patient to discharge at a wheelchair level [place]. Patient's care partner is independent to provide the necessary physical assistance at discharge. Pt's son and 2 sisters present for family education training today. Pt and family verbalized and demonstrated confidence with all tasks necessary for safe discharge home.   All goals met  Recommendation:  Patient will benefit from ongoing skilled PT services in home health setting to continue to advance safe functional mobility, address ongoing impairments in ambulation, stair navigation, LE strength, balance/coordination/motor planning, endurance, NMR, and to minimize fall risk.  Equipment: manual WC  Reasons for discharge: treatment goals met and discharge from hospital  Patient/family agrees with progress made and goals achieved: Yes  Today's Interventions: Received pt supine in bed, pt agreeable to therapy, and did not report pain at beginning of session. Son ([name]) and sisters Perrin Smack and Bethena Roys) present for family education training. Pt performed bed mobility with supervision. Pt transferred bed<>WC stand<>pivot without AD min A from son. Therapist readjusted TLSO for comfort and educated family on brace adjustments and WC parts management. Pt performed WC mobility 138f with bilateral UE and supervision. Pt navigated 4 steps x 4 trials with bilateral rails min A ascending and descending with a step to pattern. Pt practiced stairs with therapist x 1 and family x 3 trials. Therapist educated family on body  mechanics/positioning and to be aware of LLE knee buckling with increased fatigue. Pt performed simulated car transfer min A with RW with son. Pt ambulated 188fon uneven surfaces with RW min A with sister. Pt ambulated additional 20014fith RW min A with sister back to room. Pt picked up soda from floor with RW and mod A from therapist. Pt reported increased bilateral hip pain and requested to lie back down in bed. PT provided pt with hot packs for pain relief. PT suggested balance assessment (TUG) however pt refused, stating "I just told you my hip hurts". Pt performed supine bridges x10 with verbal cues for technique to provide pressure relief from buttocks. Concluded session with pt supine in bed, needs within reach, and bed alarm on. Pt and family verbalized and demonstrated confidence with all tasks necessary for discharge home.   PT Discharge Precautions/Restrictions Precautions Precautions: Fall Precaution Comments: L knee buckles with fatigue, TLSO for comfort with mobility Required Braces or Orthoses: Other Brace(TLSO for comfort with mobility) Spinal Brace: Thoracolumbosacral orthotic Spinal Brace Comments: for comfort with mobility Restrictions Weight Bearing Restrictions: No Cognition Overall Cognitive Status: Within Functional Limits for tasks assessed Arousal/Alertness: Awake/alert Orientation Level: Oriented X4 Attention: Selective Selective Attention: Appears intact Memory: Impaired Memory Impairment: Decreased short term memory Decreased Short Term Memory: Verbal basic;Functional complex Awareness: Impaired Awareness Impairment: Anticipatory impairment Problem Solving: Impaired Problem Solving Impairment: Verbal complex;Functional complex Reasoning: Appears intact Safety/Judgment: Impaired Sensation Sensation Light Touch: Appears Intact Proprioception: Appears Intact Coordination Gross Motor Movements are Fluid and Coordinated: No Fine Motor Movements are Fluid and  Coordinated: No Coordination and Movement Description: Grossly uncoordinated due to LLE weakness Finger Nose Finger Test: decreased speed on LUE Heel Shin Test: decreased speed and ROM bilaterally Motor  Motor Motor: Abnormal postural alignment  and control;Hemiplegia Motor - Skilled Clinical Observations: mild L LE hemiparesis, grossly uncoordinated due to L LE weakness  Mobility Bed Mobility Bed Mobility: Rolling Right;Rolling Left;Supine to Sit;Sit to Supine Rolling Right: Supervision/verbal cueing Rolling Left: Supervision/Verbal cueing Supine to Sit: Supervision/Verbal cueing Sit to Supine: Supervision/Verbal cueing Transfers Transfers: Sit to Stand;Stand Pivot Transfers;Squat Pivot Transfers;Stand to Sit Sit to Stand: Minimal Assistance - Patient > 75% Stand to Sit: Minimal Assistance - Patient > 75% Stand Pivot Transfers: Minimal Assistance - Patient > 75%(with RW) Stand Pivot Transfer Details: Verbal cues for technique;Verbal cues for sequencing;Verbal cues for precautions/safety Stand Pivot Transfer Details (indicate cue type and reason): Verbal cues for turning sequence and technique Squat Pivot Transfers: Minimal Assistance - Patient > 75% Transfer (Assistive device): Rolling walker Locomotion  Gait Ambulation: Yes Gait Assistance: Minimal Assistance - Patient > 75%(RW) Gait Distance (Feet): 200 Feet Assistive device: Rolling walker Gait Assistance Details: Verbal cues for technique;Verbal cues for precautions/safety Gait Assistance Details: Verbal cues for knee extension L>R, upright posture, and increased step length Gait Gait velocity: decreased Stairs / Additional Locomotion Stairs: Yes Stairs Assistance: Minimal Assistance - Patient > 75% Stair Management Technique: Two rails Number of Stairs: 4 Height of Stairs: 6 Ramp: Minimal Assistance - Patient >75%(RW) Wheelchair Mobility Wheelchair Mobility: Yes Wheelchair Assistance: Dentist: Both upper extremities Wheelchair Parts Management: Independent Distance: 146f  Trunk/Postural Assessment  Cervical Assessment Cervical Assessment: Exceptions to WFL(forward head) Thoracic Assessment Thoracic Assessment: Exceptions to WFL(kyphosis) Lumbar Assessment Lumbar Assessment: Exceptions to WFL(posterior pelvic tilt) Postural Control Postural Control: Deficits on evaluation  Balance Balance Balance Assessed: Yes Static Sitting Balance Static Sitting - Balance Support: No upper extremity supported Static Sitting - Level of Assistance: 5: Stand by assistance Dynamic Sitting Balance Dynamic Sitting - Balance Support: No upper extremity supported Dynamic Sitting - Level of Assistance: 5: Stand by assistance Static Standing Balance Static Standing - Balance Support: Bilateral upper extremity supported(RW) Static Standing - Level of Assistance: 4: Min assist Dynamic Standing Balance Dynamic Standing - Balance Support: Bilateral upper extremity supported(RW) Dynamic Standing - Level of Assistance: 4: Min assist Extremity Assessment  RLE Assessment RLE Assessment: Exceptions to WL[facility] NorthGeneral Strength Comments: Grossly generalized to 4-/5 LLE Assessment LLE Assessment: Exceptions to WCarris Health LLCGeneral Strength Comments: grossly generalized to 4-/5   AAlfonse AlpersPT, DPT  12/04/2019, 10:55 AM

## 2019-12-04 NOTE — Progress Notes (Signed)
Speech Language Pathology Discharge Summary  Patient Details  Name: [name] MRN: [id number] Date of Birth: 1951/04/21   Patient has met 5 of 5 long term goals.  Patient to discharge at overall Supervision level.  Reasons goals not met: n/a   Clinical Impression/Discharge Summary:   Although pt's progress fluctuated throughout her stay due to various medical complications that limited her participation at at time, she ultimately made excellent functional gains and met 5 out of 5 long term goals this admission. Pt currently requires Supervision assist for basic cognitive tasks and will require 24/7 supervision. Since pt met goals for basic problem solving at Supervision A, semi-complex problem solving activities were introduced however she continued to require increased Min A (in comparison to basic level Sup A). Pt has demonstrated improved sustained, selective, and alternating attention, emergent and anticipatory awareness, basic problem solving, and use of compensatory strategies for daily recall of functional information. Recommendation for 24/7 supervision for cognition stands, however given pt is believed to be back to baseline, no follow up ST services are indicated. Pt education is complete at this time.  Care Partner:  Caregiver Able to Provide Assistance: Yes  Type of Caregiver Assistance: Cognitive  Recommendation:  24 hour supervision/assistance      Equipment: none   Reasons for discharge: Discharged from hospital   Patient/Family Agrees with Progress Made and Goals Achieved: Yes    Arbutus Leas 12/04/2019, 7:29 AM

## 2019-12-04 NOTE — Procedures (Signed)
Patient was seen on dialysis and the procedure was supervised.  BFR 400  Via TDC BP is  118/78.   Patient appears to be tolerating treatment well  Louis Meckel 12/04/2019

## 2019-12-04 NOTE — Progress Notes (Signed)
Pt continues to refuse laxatives. States she will d/c home tomorrow and take her dulcolax at home. Have charge RN discuss with her. Aware may prevent discharge if MD does not agree with her plan.

## 2019-12-04 NOTE — Progress Notes (Signed)
Offered lactulose and/or sorbitol. Pt refused both. EDU given on constipation

## 2019-12-04 NOTE — Progress Notes (Signed)
Patient informed of no recent bowel movement, refused any treatment regimen and stated "she is going home tomorrow." No s/s of distress noted at this time. Will continue plan of care. Audie Clear, LPN

## 2019-12-04 NOTE — Progress Notes (Signed)
Occupational Therapy Session Note  Patient Details  Name: [name] MRN: [id number] Date of Birth: Apr 07, 1951  Today's Date: 12/04/2019 OT Individual Time: [id number] OT Individual Time Calculation (min): 60 min    Short Term Goals: Week 4:  OT Short Term Goal 1 (Week 4): Pt will tolerate sitting OOB 2 hrs to demonstrate increased activity tolerance OT Short Term Goal 2 (Week 4): Pt will compete bathing with mod assist OT Short Term Goal 3 (Week 4): Pt will complete LB dressing at sit > stand level with min assist OT Short Term Goal 4 (Week 4): Pt will don socks and shoes with min assist OT Short Term Goal 5 (Week 4): Pt will complete toilet transfer with min assist  Skilled Therapeutic Interventions/Progress Updates:    Treatment session with focus on hands on family education.  Pt received supine in bed with no family present, family arrived at 39.  Engaged in LB dressing to include donning shoes with min assist for donning Lt shoe.  Pt completed bed mobility with supervision and donned TLSO with setup assist.  Upon arrival of family, discussed typical routine with bathing and dressing. Discussed positioning of caregiver to increase pt independence and safety for pt and caregiver.  Engaged in transfer training in ADL apt with pt's son and 2 sisters.  Pt ambulated to toilet with RW with min assist for sit > stand and CGA during ambulation.  Both sisters and son completed transfers to/from toilet with pt with RW.  One sister and son able to complete without additional cues, the other sister required additional practice with noted improvements.  Pt able to direct caregivers as needed, appropriately recalling sequencing and positioning.  Educated on walk-in shower transfer with pt completing with therapist.  Recommend that pt complete initial shower transfer at home with [facility] as pt has not had sufficient practice and reports that a niece (not present) will be the primary person to assist with  bathing.  Each family member present, able to return demonstration of proper positioning and sequencing with sit <> stand and short distance ambulatory transfers.  Pt returned to room and transferred back to bed and left in sidelying for pain relief.   Therapy Documentation Precautions:  Precautions Precautions: Fall Precaution Comments: L knee buckles with fatigue, TLSO for comfort with mobility Required Braces or Orthoses: Other Brace(TLSO for comfort with mobility) Spinal Brace: Thoracolumbosacral orthotic Spinal Brace Comments: for comfort with mobility Restrictions Weight Bearing Restrictions: No Pain:  Pt with c/o pain in buttocks and Lt hip.  Premedicated.  Repositioned frequently.  Therapy/Group: Individual Therapy  [name] 12/04/2019, 12:18 PM

## 2019-12-04 NOTE — Progress Notes (Signed)
Pt slept well during night, up eating her dinner from night before this am. continues on IV aBT. Still no BM as of yet. Refused intervention, educated on subject

## 2019-12-04 NOTE — Progress Notes (Signed)
[place] PHYSICAL MEDICINE & REHABILITATION PROGRESS NOTE   Subjective/Complaints: Patient seen laying in bed this morning.  She states she slept well overnight.  She states that a good weekend.  She states she is eager for discharge tomorrow.  ROS: Denies CP, SOB, N/V/D  Objective:   No results found. Recent Labs    12/01/19 1255 12/04/19 0715  WBC 6.3 6.3  HGB 9.2* 10.3*  HCT 30.9* 34.6*  PLT 232 240   Recent Labs    12/01/19 1255  NA 136  K 4.7  CL 96*  CO2 25  GLUCOSE 174*  BUN 38*  CREATININE 8.43*  CALCIUM 8.8*    Intake/Output Summary (Last 24 hours) at 12/04/2019 0838 Last data filed at 12/03/2019 1830 Gross per 24 hour  Intake 462 ml  Output --  Net 462 ml     Physical Exam: Vital Signs Blood pressure (!) 160/94, pulse 89, temperature 98 F (36.7 C), resp. rate 16, height 5\' 7"  (1.702 m), weight 58 kg, SpO2 100 %.  Constitutional: No distress . Vital signs reviewed. HENT: Normocephalic.  Atraumatic. Eyes: EOMI. No discharge. Cardiovascular: No JVD. Respiratory: Normal effort.  No stridor. GI: Non-distended. Skin: Warm and dry.  Intact. Psych: Normal mood.  Normal behavior. Musc: No edema in extremities.  No tenderness in extremities. Neuro: Alert and oriented x3 Motor:  RLE: HF, KE 4+/5, ADF 4+/5 stable LLE: HF, KE 4--4/5, ADF 4+/5 , stable  Assessment/Plan: 1. Functional deficits secondary to right basal ganglia stroke which require 3+ hours per day of interdisciplinary therapy in a comprehensive inpatient rehab setting.  Physiatrist is providing close team supervision and 24 hour management of active medical problems listed below.  Physiatrist and rehab team continue to assess barriers to discharge/monitor patient progress toward functional and medical goals  Care Tool:  Bathing  Bathing activity did not occur: Refused(stated she bathed this AM with RN staff) Body parts bathed by patient: Right arm, Left arm, Chest, Abdomen, Front  perineal area, Right upper leg, Left upper leg, Face, Right lower leg, Left lower leg   Body parts bathed by helper: Buttocks     Bathing assist Assist Level: Minimal Assistance - Patient > 75%     Upper Body Dressing/Undressing Upper body dressing   What is the patient wearing?: Pull over shirt, Orthosis    Upper body assist Assist Level: Set up assist    Lower Body Dressing/Undressing Lower body dressing      What is the patient wearing?: Pants     Lower body assist Assist for lower body dressing: Supervision/Verbal cueing     Toileting Toileting    Toileting assist Assist for toileting: Maximal Assistance - Patient 25 - 49%     Transfers Chair/bed transfer  Transfers assist     Chair/bed transfer assist level: Minimal Assistance - Patient > 75% Chair/bed transfer assistive device: Armrests, Programmer, multimedia   Ambulation assist   Ambulation activity did not occur: Safety/medical concerns(back pain, bilateral LE weakness, poor bilateral knee control)  Assist level: Minimal Assistance - Patient > 75% Assistive device: Walker-rolling Max distance: 60 ft   Walk 10 feet activity   Assist  Walk 10 feet activity did not occur: Safety/medical concerns(back pain, bilateral LE weakness, poor bilateral knee control)  Assist level: Minimal Assistance - Patient > 75% Assistive device: Walker-rolling   Walk 50 feet activity   Assist Walk 50 feet with 2 turns activity did not occur: Safety/medical concerns(back pain, bilateral LE weakness,  poor bilateral knee control)  Assist level: Minimal Assistance - Patient > 75% Assistive device: Walker-rolling    Walk 150 feet activity   Assist Walk 150 feet activity did not occur: Safety/medical concerns(back pain, bilateral LE weakness, poor bilateral knee control)  Assist level: Minimal Assistance - Patient > 75% Assistive device: Walker-rolling    Walk 10 feet on uneven surface   activity   Assist Walk 10 feet on uneven surfaces activity did not occur: Safety/medical concerns(back pain, bilateral LE weakness, poor bilateral knee control)   Assist level: Minimal Assistance - Patient > 75% Assistive device: Aeronautical engineer Will patient use wheelchair at discharge?: Yes Type of Wheelchair: Manual Wheelchair activity did not occur: Safety/medical concerns(Pt could not tolerate sitting in WC for long enough to complete activity due to low back pain)  Wheelchair assist level: Supervision/Verbal cueing Max wheelchair distance: 135ft    Wheelchair 50 feet with 2 turns activity    Assist    Wheelchair 50 feet with 2 turns activity did not occur: Safety/medical concerns(Pt could not tolerate sitting in WC for long enough to complete activity due to low back pain)   Assist Level: Supervision/Verbal cueing   Wheelchair 150 feet activity     Assist  Wheelchair 150 feet activity did not occur: Safety/medical concerns(Pt could not tolerate sitting in WC for long enough to complete activity due to low back pain)   Assist Level: Supervision/Verbal cueing   Blood pressure (!) 160/94, pulse 89, temperature 98 F (36.7 C), resp. rate 16, height 5\' 7"  (1.702 m), weight 58 kg, SpO2 100 %.    Medical Problem List and Plan: 1.  Impaired gait with left side weakness secondary to right basal ganglia infarction as well as small cortical infarct left frontal lobe  Discussed with neurology results of the EEG -findings possibly related to uremia +/- cefepime, signed off  Discussed with psychiatry, continue medical management, signed off  Discussed with hospitalist, appreciate recs, discussed with hospitalist again on 12/25, signed off  Continue CIR 2.  Antithrombotics: -DVT/anticoagulation: Right IJ acute DVT.  Plan anticoagulation x3 months  Transitioned to Eliquis as it has been very difficult to manage INR    Repeat U/S suggesting  chronic DVT             -antiplatelet therapy: N/A 3. Pain Management chronic back and left hip pain: Neurontin 100 mg on hold  TLSO back brace for comfort  Lidocaine patch added to coccyx  Voltaren gel added for left greater trochanteric area on 1/1 with improvement 4. Mood: Provide emotional support             -antipsychotic agents: N/A 5. Neuropsych: This patient is not capable of making decisions on her own behalf. 6. Skin/Wound Care: Routine skin checks  Santyl to sacral wound - WOC notes reviewed 7. Fluids/Electrolytes/Nutrition: Routine in and outs 8.  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  New HD tunneled catheter placed 10/26/2019.  Repeat echo showing increase in mitral regurg  CT surgery recommending mitral valve replacement, not recommending surgical intervention at present and recommends medical management- will follow as outpatient. 9.  ID/discitis/recurrent Pseudomonas bacteremia.    Cipro/Maxipime changed to ceftazidime/Cipro through 12/06/2019.  Seen by neurosurgery on 12/25, no surgical intervention recommended at this point, however recommended following ID recs  Appreciate ID recs, signed off 10.  ESRD MWF.  Follow-up per renal services.  11.  Diabetes mellitus.  Hemoglobin A1c 7.6.  SSI. CBG (last 3)  Recent Labs    12/03/19 1131 12/03/19 2101 12/04/19 0616  GLUCAP 143* 183* 137*   Labile with improved p.o. intake 12.  Hypertension.  Imdur 30 mg daily  Coreg 6.25 mg twice daily, increased to 12.5 on 12/15, parameters placed.    Monitor with increased mobility. Vitals:   12/03/19 1954 12/04/19 0436  BP: (!) 152/87 (!) 160/94  Pulse: 94 89  Resp: 17 16  Temp: 98.8 F (37.1 C) 98 F (36.7 C)  SpO2: 100% 100%   Labile on 1/11 13.  Acute on chronic anemia.    Hemoglobin 10.3 on 1/11  Continue to monitor 14. Constipation:  See #16  Abd xray showing?  Volvulus  CT  abdomen/pelvis showing possible Ogilvie's  Improved 15.  Candidiasis  Diflucan ordered on 12/16  Repeat Ucx not collected yet on 1/7 16.  Hyperammonemia with hepatic encephalopathy  Lactulose started on 12/17, encourage compliance  Ammonia WNL on 12/22 17. Sleep disturance  Melatonin added on 12/17  Sleep chart ordered  Improved 18.  Leukocytosis: Resolved  Afebrile  Continue to monitor 19.  GERD  Home Protonix ordered on 12/20 20.  Pneumatosis Coli  CT abdomen/pelvis on 12/24?  Obese.  Appreciate surgery recs-recommending GI recs, no surgical intervention, signed off  Appreciate GI recs, advising against colonoscopy and recommending conservative care at present with repeat imaging in 1-3 months.  LOS: 33 days A FACE TO FACE EVALUATION WAS PERFORMED  Sakina Briones Lorie Phenix 12/04/2019, 8:38 AM

## 2019-12-04 NOTE — Progress Notes (Signed)
[facility] Progress Note    Assessment/ Plan:   1. Acute CVA with evidence of chronic ischemia/old infarcts:admit Oct 20, 2019, L sided weakness. Seen by neurology-suspected embolic infarct from endocarditis. Ongoing inpatient rehabilitation for management of neurological deficits. Repeat MRI showedno acute changes 2. Pseudomonas MV endocarditis/ bacteremia: HD cath exchanged for infection focus control. Seen by TCTS w/ no plan for surgical intervention.Antiboitic through 12/06/19. 3. Progression L3-4 discitis on MRI. Evaluated by neurosurgery. No indication for surgical intervention. ID has directed ABX and duration. Antibiotics changed to Fortaz/Cipro.  4. R IJ acute DVT: on 3 mos Eliquis (change from coumadin due to difficulties managing INR) 5. ESRD: Typically MWF HD at [facility]. ESRD x 1 year, tried AVF "6 times" w/o success. TDC dependent. HD today, generally 1L UF.  3K bath here. Tight heparin.  6. Anemia:hgb improved to 9s. ESA qMon. tsat 16% 12/15 - Fe held due to being treated for infection 7. 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  8. Hypertension/volume : BP low/stable. No vol^ on exam.  9.Nutrition -diet liberalized to regular - has supplements/multivits ordered - alb very low - not eating much - contributing to weakness 10. Deconditioning -in CIR - afternoon dialysis to help with AM therapy stamina.  Will let home HD unit know that she will be back there on Wednesday !      Subjective:    Seen on HD-  No c/o's-  Discharge set for tomorrow   Objective:   BP 118/78   Pulse 91   Temp 98.1 F (36.7 C) (Oral)   Resp 18   Ht 5\' 7"  (1.702 m)   Wt 58 kg   SpO2 98%   BMI 20.03 kg/m   Intake/Output Summary (Last 24 hours) at 12/04/2019 1535 Last data filed at 12/03/2019 1830 Gross per 24 hour  Intake 222 ml  Output --  Net 222 ml   Weight change:   HD Outpatient: [facility] MWF  3.5h  TDC (mult  AVF attempts failed)  62.5kg  2/2.5 bath 400/600 - hect 1.5 ug - EPO 1200u each HD, iron 50mg  weekly  Physical Exam: Gen: NAD, on L side in bed vomiting. CVS: RRR Resp: clear bilaterally Abd: soft nontender NABS Ext: no LE edema  Imaging: No results found.  Labs: BMET Recent Labs  Lab 11/27/19 1625 11/29/19 1317 12/01/19 1255 12/04/19 1346  NA 140 140 136 138  K 3.8 4.6 4.7 6.2*  CL 98 100 96* 100  CO2 29 26 25 23   GLUCOSE 178* 196* 174* 180*  BUN 40* 42* 38* 48*  CREATININE 8.15* 8.40* 8.43* 9.47*  CALCIUM 8.6* 8.6* 8.8* 8.9  PHOS 3.3 3.6 3.6 4.0   CBC Recent Labs  Lab 11/27/19 1625 11/29/19 1317 12/01/19 1255 12/04/19 0715  WBC 7.8 8.6 6.3 6.3  HGB 8.9* 9.1* 9.2* 10.3*  HCT 29.2* 30.0* 30.9* 34.6*  MCV 92.4 93.5 95.1 94.5  PLT 254 223 232 240    Medications:    Marland Kitchen Darbepoetin Alfa      . doxercalciferol      . acetaminophen  500 mg Oral TID  . apixaban  5 mg Oral BID  . calcium carbonate  1 tablet Oral 2 times per day  . carvedilol  12.5 mg Oral BID  . Chlorhexidine Gluconate Cloth  6 each Topical Q0600  . ciprofloxacin  500 mg Oral Q24H  . collagenase   Topical Daily  . darbepoetin (ARANESP) injection -  DIALYSIS  100 mcg Intravenous Q Mon-HD  . diclofenac Sodium  2 g Topical QID  . doxercalciferol  1.5 mcg Intravenous Q M,W,F-HD  . feeding supplement (ENSURE ENLIVE)  237 mL Oral BID BM  . insulin aspart  0-9 Units Subcutaneous TID WC  . isosorbide mononitrate  30 mg Oral QPC lunch  . lactulose  20 g Oral TID  . lidocaine  1 patch Transdermal Q24H  . Melatonin  3 mg Oral QHS  . multivitamin  1 tablet Oral QPC lunch  . Muscle Rub   Topical BID  . pantoprazole  40 mg Oral Daily  . saccharomyces boulardii  250 mg Oral BID

## 2019-12-05 LAB — GLUCOSE, CAPILLARY: Glucose-Capillary: 88 mg/dL (ref 70–99)

## 2019-12-05 MED ORDER — LACTULOSE 10 GM/15ML PO SOLN: 20.0000 g | Freq: Three times a day (TID) | ORAL | 0 refills | Status: DC

## 2019-12-05 MED ORDER — CARVEDILOL 12.5 MG PO TABS: 12.5000 mg | ORAL_TABLET | Freq: Two times a day (BID) | ORAL | 1 refills | Status: DC

## 2019-12-05 NOTE — Progress Notes (Signed)
[place] PHYSICAL MEDICINE & REHABILITATION PROGRESS NOTE   Subjective/Complaints: Patient seen laying in bed this morning.  She states she slept well overnight, confirmed with sleep chart.  She is very eager to leave the hospital and go to her home today.  She is seen by nephrology yesterday, notes reviewed.  ROS: Denies CP, SOB, N/V/D  Objective:   No results found. Recent Labs    12/04/19 0715  WBC 6.3  HGB 10.3*  HCT 34.6*  PLT 240   Recent Labs    12/04/19 1346  NA 138  K 6.2*  CL 100  CO2 23  GLUCOSE 180*  BUN 48*  CREATININE 9.47*  CALCIUM 8.9    Intake/Output Summary (Last 24 hours) at 12/05/2019 0841 Last data filed at 12/04/2019 1641 Gross per 24 hour  Intake --  Output 512 ml  Net -512 ml     Physical Exam: Vital Signs Blood pressure 138/90, pulse 84, temperature 98.7 F (37.1 C), temperature source Oral, resp. rate 18, height 5\' 7"  (1.702 m), weight 57 kg, SpO2 100 %.  Constitutional: No distress . Vital signs reviewed. HENT: Normocephalic.  Atraumatic. Eyes: EOMI. No discharge. Cardiovascular: No JVD. Respiratory: Normal effort.  No stridor. GI: Non-distended. Skin: Warm and dry.  Intact. Psych: Normal mood.  Normal behavior. Musc: No edema in extremities.  No tenderness in extremities. Neuro: Alert and oriented x3 Motor:  RLE: HF, KE 4+/5, ADF 4+/5, unchanged LLE: HF, KE 4--4/5, ADF 4+/5 , unchanged  Assessment/Plan: 1. Functional deficits secondary to right basal ganglia stroke which require 3+ hours per day of interdisciplinary therapy in a comprehensive inpatient rehab setting.  Physiatrist is providing close team supervision and 24 hour management of active medical problems listed below.  Physiatrist and rehab team continue to assess barriers to discharge/monitor patient progress toward functional and medical goals  Care Tool:  Bathing  Bathing activity did not occur: Refused(stated she bathed this AM with RN staff) Body parts  bathed by patient: Right arm, Left arm, Chest, Abdomen, Front perineal area, Right upper leg, Left upper leg, Face, Right lower leg, Left lower leg   Body parts bathed by helper: Buttocks     Bathing assist Assist Level: Minimal Assistance - Patient > 75%     Upper Body Dressing/Undressing Upper body dressing   What is the patient wearing?: Pull over shirt, Orthosis    Upper body assist Assist Level: Set up assist    Lower Body Dressing/Undressing Lower body dressing      What is the patient wearing?: Pants     Lower body assist Assist for lower body dressing: Supervision/Verbal cueing     Toileting Toileting    Toileting assist Assist for toileting: Minimal Assistance - Patient > 75%     Transfers Chair/bed transfer  Transfers assist     Chair/bed transfer assist level: Minimal Assistance - Patient > 75% Chair/bed transfer assistive device: Armrests, Programmer, multimedia   Ambulation assist   Ambulation activity did not occur: Safety/medical concerns(back pain, bilateral LE weakness, poor bilateral knee control)  Assist level: Minimal Assistance - Patient > 75% Assistive device: Walker-rolling Max distance: 236ft   Walk 10 feet activity   Assist  Walk 10 feet activity did not occur: Safety/medical concerns(back pain, bilateral LE weakness, poor bilateral knee control)  Assist level: Minimal Assistance - Patient > 75% Assistive device: Walker-rolling   Walk 50 feet activity   Assist Walk 50 feet with 2 turns activity did not occur:  Safety/medical concerns(back pain, bilateral LE weakness, poor bilateral knee control)  Assist level: Minimal Assistance - Patient > 75% Assistive device: Walker-rolling    Walk 150 feet activity   Assist Walk 150 feet activity did not occur: Safety/medical concerns(back pain, bilateral LE weakness, poor bilateral knee control)  Assist level: Minimal Assistance - Patient > 75% Assistive device:  Walker-rolling    Walk 10 feet on uneven surface  activity   Assist Walk 10 feet on uneven surfaces activity did not occur: Safety/medical concerns(back pain, bilateral LE weakness, poor bilateral knee control)   Assist level: Minimal Assistance - Patient > 75% Assistive device: Aeronautical engineer Will patient use wheelchair at discharge?: Yes Type of Wheelchair: Manual Wheelchair activity did not occur: Safety/medical concerns(Pt could not tolerate sitting in WC for long enough to complete activity due to low back pain)  Wheelchair assist level: Supervision/Verbal cueing Max wheelchair distance: 150ft    Wheelchair 50 feet with 2 turns activity    Assist    Wheelchair 50 feet with 2 turns activity did not occur: Safety/medical concerns(Pt could not tolerate sitting in WC for long enough to complete activity due to low back pain)   Assist Level: Supervision/Verbal cueing   Wheelchair 150 feet activity     Assist  Wheelchair 150 feet activity did not occur: Safety/medical concerns(Pt could not tolerate sitting in WC for long enough to complete activity due to low back pain)   Assist Level: Supervision/Verbal cueing   Blood pressure 138/90, pulse 84, temperature 98.7 F (37.1 C), temperature source Oral, resp. rate 18, height 5\' 7"  (1.702 m), weight 57 kg, SpO2 100 %.    Medical Problem List and Plan: 1.  Impaired gait with left side weakness secondary to right basal ganglia infarction as well as small cortical infarct left frontal lobe  Discussed with neurology results of the EEG -findings possibly related to uremia +/- cefepime, signed off  Discussed with psychiatry, continue medical management, signed off  Discussed with hospitalist, appreciate recs, discussed with hospitalist again on 12/25, signed off  DC today  Will see patient for transitional care management in 1-2 weeks post-discharge 2.  Antithrombotics: -DVT/anticoagulation:  Right IJ acute DVT.  Plan anticoagulation x3 months  Transitioned to Eliquis as it has been very difficult to manage INR    Repeat U/S suggesting chronic DVT             -antiplatelet therapy: N/A 3. Pain Management chronic back and left hip pain: Neurontin 100 mg on hold  TLSO back brace for comfort  Lidocaine patch added to coccyx  Voltaren gel added for left greater trochanteric area on 1/1 with improvement  Improving 4. Mood: Provide emotional support             -antipsychotic agents: N/A 5. Neuropsych: This patient is not capable of making decisions on her own behalf. 6. Skin/Wound Care: Routine skin checks  Santyl to sacral wound - WOC notes reviewed 7. Fluids/Electrolytes/Nutrition: Routine in and outs 8.  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  New HD tunneled catheter placed 10/26/2019.  Repeat echo showing increase in mitral regurg  CT surgery recommending mitral valve replacement, not recommending surgical intervention at present and recommends medical management- will follow as outpatient. 9.  ID/discitis/recurrent Pseudomonas bacteremia.    Cipro/Maxipime changed to ceftazidime/Cipro through today given discharge date.  Seen by neurosurgery on 12/25, no surgical intervention recommended at this point,  however recommended following ID recs  Appreciate ID recs, signed off 10.  ESRD MWF.  Follow-up per renal services.  11.  Diabetes mellitus.  Hemoglobin A1c 7.6.  SSI. CBG (last 3)  Recent Labs    12/04/19 1812 12/04/19 2104 12/05/19 0602  GLUCAP 126* 179* 88   Labile on 1/12 12.  Hypertension.  Imdur 30 mg daily  Coreg 6.25 mg twice daily, increased to 12.5 on 12/15, parameters placed.    Monitor with increased mobility. Vitals:   12/04/19 1949 12/05/19 0503  BP: 124/81 138/90  Pulse: (!) 105 84  Resp: 18 18  Temp: 98.3 F (36.8 C) 98.7 F (37.1 C)  SpO2: 99% 100%   Relatively  controlled on 1/12 13.  Acute on chronic anemia.    Hemoglobin 10.3 on 1/11  Continue to monitor 14. Constipation:  See #16  Abd xray showing?  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  Candidiasis  Diflucan ordered on 12/16  Repeat Ucx not collected yet on 1/7 16.  Hyperammonemia with hepatic encephalopathy  Lactulose started on 12/17, encourage compliance  Ammonia WNL on 12/22 17. Sleep disturance  Melatonin added on 12/17  Sleep chart ordered  Improved 18.  Leukocytosis: Resolved  Afebrile  Continue to monitor 19.  GERD  Home Protonix ordered on 12/20 20.  Pneumatosis Coli  CT abdomen/pelvis on 12/24?  Obese.  Appreciate surgery recs-recommending GI recs, no surgical intervention, signed off  Appreciate GI recs, advising against colonoscopy and recommending conservative care at present with repeat imaging in 1-3 months.  LOS: 34 days A FACE TO FACE EVALUATION WAS PERFORMED  Tome Wilson Lorie Phenix 12/05/2019, 8:41 AM

## 2019-12-05 NOTE — Progress Notes (Signed)
Renal Navigator notified patient's OP HD clinic/[facility] of patient's plan for discharge from [place] today, 12/05/19 and last inpatient HD yesterday, 12/04/19. She should plan to return to the OP HD clinic for her next HD treatment tomorrow, 12/06/19 on her previous seat schedule.  Alphonzo Cruise, [place] Renal Navigator [phone]

## 2019-12-05 NOTE — Progress Notes (Signed)
Social Work Discharge Note   The overall goal for the admission was met for:   Discharge location: Yes - home with son and sisters to provide 24/7 assistance  Length of Stay: Yes - 34 days  Discharge activity level: Yes - supervision overall  Home/community participation: Yes  Services provided included: MD, RD, PT, OT, SLP, RN, TR, Pharmacy and SW  Financial Services: Medicare  Follow-up services arranged: Home Health: RN, PT, OT via [place] , DME: 18x18 lightweight w/c, cushion, 3n1 commode via [facility] (son has purchased tub seat on his own) and Patient/Family has no preference for HH/DME agencies  Comments (or additional information):    Pandora Leiter, [name] @ [phone]  Patient/Family verbalized understanding of follow-up arrangements: Yes  Individual responsible for coordination of the follow-up plan: pt/ son  Confirmed correct DME delivered: Lennart Pall 12/05/2019    Lilou Kneip, [name]

## 2019-12-05 NOTE — Progress Notes (Signed)
Pt slept throughout night without issue. Refused all laxatives, PA informed this am last BM 1/6. Discharge pending today

## 2019-12-06 ENCOUNTER — Inpatient Hospital Stay: Payer: Medicare PPO | Admitting: Internal Medicine

## 2019-12-06 ENCOUNTER — Telehealth: Payer: Self-pay | Admitting: Registered Nurse

## 2019-12-06 DIAGNOSIS — Z992 Dependence on renal dialysis: Secondary | ICD-10-CM | POA: Diagnosis not present

## 2019-12-06 DIAGNOSIS — D631 Anemia in chronic kidney disease: Secondary | ICD-10-CM | POA: Diagnosis not present

## 2019-12-06 DIAGNOSIS — I69354 Hemiplegia and hemiparesis following cerebral infarction affecting left non-dominant side: Secondary | ICD-10-CM | POA: Diagnosis not present

## 2019-12-06 DIAGNOSIS — I330 Acute and subacute infective endocarditis: Secondary | ICD-10-CM | POA: Diagnosis not present

## 2019-12-06 DIAGNOSIS — N186 End stage renal disease: Secondary | ICD-10-CM | POA: Diagnosis not present

## 2019-12-06 DIAGNOSIS — G8194 Hemiplegia, unspecified affecting left nondominant side: Secondary | ICD-10-CM | POA: Diagnosis not present

## 2019-12-06 DIAGNOSIS — E1122 Type 2 diabetes mellitus with diabetic chronic kidney disease: Secondary | ICD-10-CM | POA: Diagnosis not present

## 2019-12-06 DIAGNOSIS — I120 Hypertensive chronic kidney disease with stage 5 chronic kidney disease or end stage renal disease: Secondary | ICD-10-CM | POA: Diagnosis not present

## 2019-12-06 DIAGNOSIS — I639 Cerebral infarction, unspecified: Secondary | ICD-10-CM | POA: Diagnosis not present

## 2019-12-06 DIAGNOSIS — M19041 Primary osteoarthritis, right hand: Secondary | ICD-10-CM | POA: Diagnosis not present

## 2019-12-06 NOTE — Telephone Encounter (Signed)
Transitional Care call Transitional Questions answerd by her son Mr. [name]  Patient name: [name]  DOB: [birth year] 1. Are you/is patient experiencing any problems since coming home? No a. Are there any questions regarding any aspect of care? No 2. Are there any questions regarding medications administration/dosing? No a. Are meds being taken as prescribed? yes b. "Patient should review meds with caller to confirm" Medication List Reviewed.  3. Have there been any falls? No 4. Has Home Health been to the house and/or have they contacted you? [facility] scheduled to come out today, he reports.  a. If not, have you tried to contact them? NA b. Can we help you contact them? NA 5. Are bowels and bladder emptying properly? ESRD on Hemodialysis on Mon/Wed/Fri. Yes her bowels are emptying properly. a. Are there any unexpected incontinence issues? No b. If applicable, is patient following bowel/bladder programs? NA 6. Any fevers, problems with breathing, unexpected pain? No 7. Are there any skin problems or new areas of breakdown? No new areas of skin breakdown. Applying Santal ointment on sacral decubitus. 8. Has the patient/family member arranged specialty MD follow up (ie cardiology/neurology/renal/surgical/etc.)?  Mr. Carlis Abbott was instructed to call Ms. Peets to schedule an appointment with her PCP, Nephrologist, Dr. Nils Pyle and Dr. Baxter Flattery. Also instructed to call [facility] to schedule a HFU appointment. He verbalizes understanding. a. Can we help arrange? No 9. Does the patient need any other services or support that we can help arrange? No 10. Are caregivers following through as expected in assisting the patient? Yes 11. Has the patient quit smoking, drinking alcohol, or using drugs as recommended? (                        )  Appointment date/time 12/06/2019  arrival time 1:20 for 1:40 appointment with Dr. Ranell Patrick. At [place]

## 2019-12-07 DIAGNOSIS — D631 Anemia in chronic kidney disease: Secondary | ICD-10-CM | POA: Diagnosis not present

## 2019-12-07 DIAGNOSIS — I120 Hypertensive chronic kidney disease with stage 5 chronic kidney disease or end stage renal disease: Secondary | ICD-10-CM | POA: Diagnosis not present

## 2019-12-07 DIAGNOSIS — I69354 Hemiplegia and hemiparesis following cerebral infarction affecting left non-dominant side: Secondary | ICD-10-CM | POA: Diagnosis not present

## 2019-12-07 DIAGNOSIS — N186 End stage renal disease: Secondary | ICD-10-CM | POA: Diagnosis not present

## 2019-12-07 DIAGNOSIS — E1122 Type 2 diabetes mellitus with diabetic chronic kidney disease: Secondary | ICD-10-CM | POA: Diagnosis not present

## 2019-12-07 DIAGNOSIS — M19041 Primary osteoarthritis, right hand: Secondary | ICD-10-CM | POA: Diagnosis not present

## 2019-12-08 DIAGNOSIS — N186 End stage renal disease: Secondary | ICD-10-CM | POA: Diagnosis not present

## 2019-12-08 DIAGNOSIS — Z992 Dependence on renal dialysis: Secondary | ICD-10-CM | POA: Diagnosis not present

## 2019-12-08 DIAGNOSIS — I120 Hypertensive chronic kidney disease with stage 5 chronic kidney disease or end stage renal disease: Secondary | ICD-10-CM | POA: Diagnosis not present

## 2019-12-08 DIAGNOSIS — I69354 Hemiplegia and hemiparesis following cerebral infarction affecting left non-dominant side: Secondary | ICD-10-CM | POA: Diagnosis not present

## 2019-12-08 DIAGNOSIS — M19041 Primary osteoarthritis, right hand: Secondary | ICD-10-CM | POA: Diagnosis not present

## 2019-12-08 DIAGNOSIS — E1122 Type 2 diabetes mellitus with diabetic chronic kidney disease: Secondary | ICD-10-CM | POA: Diagnosis not present

## 2019-12-08 DIAGNOSIS — D631 Anemia in chronic kidney disease: Secondary | ICD-10-CM | POA: Diagnosis not present

## 2019-12-11 DIAGNOSIS — N186 End stage renal disease: Secondary | ICD-10-CM | POA: Diagnosis not present

## 2019-12-11 DIAGNOSIS — L89300 Pressure ulcer of unspecified buttock, unstageable: Secondary | ICD-10-CM | POA: Diagnosis not present

## 2019-12-11 DIAGNOSIS — L89893 Pressure ulcer of other site, stage 3: Secondary | ICD-10-CM | POA: Diagnosis not present

## 2019-12-11 DIAGNOSIS — Z992 Dependence on renal dialysis: Secondary | ICD-10-CM | POA: Diagnosis not present

## 2019-12-12 DIAGNOSIS — E1122 Type 2 diabetes mellitus with diabetic chronic kidney disease: Secondary | ICD-10-CM | POA: Diagnosis not present

## 2019-12-12 DIAGNOSIS — N186 End stage renal disease: Secondary | ICD-10-CM | POA: Diagnosis not present

## 2019-12-12 DIAGNOSIS — D631 Anemia in chronic kidney disease: Secondary | ICD-10-CM | POA: Diagnosis not present

## 2019-12-12 DIAGNOSIS — I69354 Hemiplegia and hemiparesis following cerebral infarction affecting left non-dominant side: Secondary | ICD-10-CM | POA: Diagnosis not present

## 2019-12-12 DIAGNOSIS — I120 Hypertensive chronic kidney disease with stage 5 chronic kidney disease or end stage renal disease: Secondary | ICD-10-CM | POA: Diagnosis not present

## 2019-12-12 DIAGNOSIS — M19041 Primary osteoarthritis, right hand: Secondary | ICD-10-CM | POA: Diagnosis not present

## 2019-12-13 DIAGNOSIS — Z992 Dependence on renal dialysis: Secondary | ICD-10-CM | POA: Diagnosis not present

## 2019-12-13 DIAGNOSIS — N186 End stage renal disease: Secondary | ICD-10-CM | POA: Diagnosis not present

## 2019-12-14 ENCOUNTER — Other Ambulatory Visit: Payer: Self-pay | Admitting: Physical Medicine and Rehabilitation

## 2019-12-14 ENCOUNTER — Telehealth: Payer: Self-pay

## 2019-12-14 DIAGNOSIS — Z9181 History of falling: Secondary | ICD-10-CM

## 2019-12-14 DIAGNOSIS — L89153 Pressure ulcer of sacral region, stage 3: Secondary | ICD-10-CM

## 2019-12-14 DIAGNOSIS — M19041 Primary osteoarthritis, right hand: Secondary | ICD-10-CM | POA: Diagnosis not present

## 2019-12-14 DIAGNOSIS — M19042 Primary osteoarthritis, left hand: Secondary | ICD-10-CM | POA: Diagnosis not present

## 2019-12-14 DIAGNOSIS — I120 Hypertensive chronic kidney disease with stage 5 chronic kidney disease or end stage renal disease: Secondary | ICD-10-CM | POA: Diagnosis not present

## 2019-12-14 DIAGNOSIS — E1122 Type 2 diabetes mellitus with diabetic chronic kidney disease: Secondary | ICD-10-CM | POA: Diagnosis not present

## 2019-12-14 DIAGNOSIS — K5900 Constipation, unspecified: Secondary | ICD-10-CM | POA: Diagnosis not present

## 2019-12-14 DIAGNOSIS — Z7901 Long term (current) use of anticoagulants: Secondary | ICD-10-CM

## 2019-12-14 DIAGNOSIS — I69354 Hemiplegia and hemiparesis following cerebral infarction affecting left non-dominant side: Secondary | ICD-10-CM | POA: Diagnosis not present

## 2019-12-14 DIAGNOSIS — I639 Cerebral infarction, unspecified: Secondary | ICD-10-CM

## 2019-12-14 DIAGNOSIS — D631 Anemia in chronic kidney disease: Secondary | ICD-10-CM | POA: Diagnosis not present

## 2019-12-14 DIAGNOSIS — N186 End stage renal disease: Secondary | ICD-10-CM | POA: Diagnosis not present

## 2019-12-14 DIAGNOSIS — Z992 Dependence on renal dialysis: Secondary | ICD-10-CM

## 2019-12-14 DIAGNOSIS — K219 Gastro-esophageal reflux disease without esophagitis: Secondary | ICD-10-CM | POA: Diagnosis not present

## 2019-12-14 MED ORDER — OXYCODONE HCL 5 MG PO TABS: 5.0000 mg | ORAL_TABLET | Freq: Four times a day (QID) | ORAL | 0 refills | Status: DC | PRN

## 2019-12-14 NOTE — Telephone Encounter (Signed)
Patient called requesting a refill of oxycodone medication.

## 2019-12-15 DIAGNOSIS — Z992 Dependence on renal dialysis: Secondary | ICD-10-CM | POA: Diagnosis not present

## 2019-12-15 DIAGNOSIS — E1122 Type 2 diabetes mellitus with diabetic chronic kidney disease: Secondary | ICD-10-CM | POA: Diagnosis not present

## 2019-12-15 DIAGNOSIS — D631 Anemia in chronic kidney disease: Secondary | ICD-10-CM | POA: Diagnosis not present

## 2019-12-15 DIAGNOSIS — I69354 Hemiplegia and hemiparesis following cerebral infarction affecting left non-dominant side: Secondary | ICD-10-CM | POA: Diagnosis not present

## 2019-12-15 DIAGNOSIS — N186 End stage renal disease: Secondary | ICD-10-CM | POA: Diagnosis not present

## 2019-12-15 DIAGNOSIS — M19041 Primary osteoarthritis, right hand: Secondary | ICD-10-CM | POA: Diagnosis not present

## 2019-12-15 DIAGNOSIS — I120 Hypertensive chronic kidney disease with stage 5 chronic kidney disease or end stage renal disease: Secondary | ICD-10-CM | POA: Diagnosis not present

## 2019-12-15 NOTE — Telephone Encounter (Signed)
I sent yesterday.  

## 2019-12-18 DIAGNOSIS — M19041 Primary osteoarthritis, right hand: Secondary | ICD-10-CM | POA: Diagnosis not present

## 2019-12-18 DIAGNOSIS — I120 Hypertensive chronic kidney disease with stage 5 chronic kidney disease or end stage renal disease: Secondary | ICD-10-CM | POA: Diagnosis not present

## 2019-12-18 DIAGNOSIS — E1122 Type 2 diabetes mellitus with diabetic chronic kidney disease: Secondary | ICD-10-CM | POA: Diagnosis not present

## 2019-12-18 DIAGNOSIS — D631 Anemia in chronic kidney disease: Secondary | ICD-10-CM | POA: Diagnosis not present

## 2019-12-18 DIAGNOSIS — Z992 Dependence on renal dialysis: Secondary | ICD-10-CM | POA: Diagnosis not present

## 2019-12-18 DIAGNOSIS — I69354 Hemiplegia and hemiparesis following cerebral infarction affecting left non-dominant side: Secondary | ICD-10-CM | POA: Diagnosis not present

## 2019-12-18 DIAGNOSIS — N186 End stage renal disease: Secondary | ICD-10-CM | POA: Diagnosis not present

## 2019-12-19 ENCOUNTER — Encounter: Payer: Medicare PPO | Attending: Physical Medicine and Rehabilitation | Admitting: Physical Medicine and Rehabilitation

## 2019-12-19 ENCOUNTER — Encounter: Payer: Self-pay | Admitting: Physical Medicine and Rehabilitation

## 2019-12-19 ENCOUNTER — Other Ambulatory Visit: Payer: Self-pay

## 2019-12-19 VITALS — BP 155/98 | HR 93 | Temp 98.0°F | Resp 14

## 2019-12-19 DIAGNOSIS — Z5181 Encounter for therapeutic drug level monitoring: Secondary | ICD-10-CM | POA: Diagnosis not present

## 2019-12-19 DIAGNOSIS — I639 Cerebral infarction, unspecified: Secondary | ICD-10-CM

## 2019-12-19 DIAGNOSIS — N186 End stage renal disease: Secondary | ICD-10-CM | POA: Diagnosis not present

## 2019-12-19 DIAGNOSIS — I8290 Acute embolism and thrombosis of unspecified vein: Secondary | ICD-10-CM | POA: Insufficient documentation

## 2019-12-19 DIAGNOSIS — E1122 Type 2 diabetes mellitus with diabetic chronic kidney disease: Secondary | ICD-10-CM | POA: Diagnosis not present

## 2019-12-19 DIAGNOSIS — M19041 Primary osteoarthritis, right hand: Secondary | ICD-10-CM | POA: Diagnosis not present

## 2019-12-19 DIAGNOSIS — Z79891 Long term (current) use of opiate analgesic: Secondary | ICD-10-CM | POA: Insufficient documentation

## 2019-12-19 DIAGNOSIS — R531 Weakness: Secondary | ICD-10-CM | POA: Diagnosis not present

## 2019-12-19 DIAGNOSIS — I120 Hypertensive chronic kidney disease with stage 5 chronic kidney disease or end stage renal disease: Secondary | ICD-10-CM | POA: Diagnosis not present

## 2019-12-19 DIAGNOSIS — I69354 Hemiplegia and hemiparesis following cerebral infarction affecting left non-dominant side: Secondary | ICD-10-CM | POA: Diagnosis not present

## 2019-12-19 DIAGNOSIS — L89150 Pressure ulcer of sacral region, unstageable: Secondary | ICD-10-CM | POA: Insufficient documentation

## 2019-12-19 DIAGNOSIS — D631 Anemia in chronic kidney disease: Secondary | ICD-10-CM | POA: Diagnosis not present

## 2019-12-19 MED ORDER — GABAPENTIN 300 MG PO CAPS: 300.0000 mg | ORAL_CAPSULE | Freq: Three times a day (TID) | ORAL | 1 refills | Status: DC

## 2019-12-19 MED ORDER — CARVEDILOL 12.5 MG PO TABS: 12.5000 mg | ORAL_TABLET | Freq: Two times a day (BID) | ORAL | 1 refills | Status: AC

## 2019-12-19 MED ORDER — ISOSORBIDE MONONITRATE ER 30 MG PO TB24: 30.0000 mg | ORAL_TABLET | Freq: Every day | ORAL | 1 refills | Status: DC

## 2019-12-19 MED ORDER — DICLOFENAC SODIUM 1 % EX GEL: 2.0000 g | Freq: Four times a day (QID) | CUTANEOUS | 1 refills | Status: DC

## 2019-12-19 MED ORDER — CALCIUM CARBONATE ANTACID 500 MG PO CHEW: 1.0000 | CHEWABLE_TABLET | Freq: Two times a day (BID) | ORAL | 1 refills | Status: DC

## 2019-12-19 MED ORDER — MECLIZINE HCL 25 MG PO TABS: 25.0000 mg | ORAL_TABLET | Freq: Three times a day (TID) | ORAL | 0 refills | Status: DC | PRN

## 2019-12-19 MED ORDER — OXYCODONE HCL 5 MG PO TABS: 5.0000 mg | ORAL_TABLET | Freq: Four times a day (QID) | ORAL | 0 refills | Status: DC | PRN

## 2019-12-19 MED ORDER — APIXABAN 5 MG PO TABS: 5.0000 mg | ORAL_TABLET | Freq: Two times a day (BID) | ORAL | 1 refills | Status: DC

## 2019-12-19 MED ORDER — MELATONIN 3 MG PO TABS: 3.0000 mg | ORAL_TABLET | Freq: Every day | ORAL | 0 refills | Status: DC

## 2019-12-19 NOTE — Progress Notes (Signed)
Subjective:    Patient ID: [name], female    DOB: [birth year], 69 y.o.   MRN: [id number]  HPI  Mrs. Sawicki presents for hospital follow-up after CIR admission for stroke.  -She has been receiving her home therapy 3 times per week and has been able to participate in some walking.  -Her son accompanies her to her visit and helps to provide history.  -She had fall in lobby prior to visit during transfer from wheelchair. Fell on coccyx and has some mild pain there. Did not hit her head and is feeling well at this time.  -She has run out of all her medications and needs refills. She has not been taking them for past few days. She has a PCP and her next appointment with him is in February. Her BP was severely elevated today at 192/111. Improved to 156/97 when seated in office.   -She describes left leg pain that radiates from her back laterally down her leg. This prevents her from fully participating with therapy. She has never tried gabapentin.   Pain Inventory Average Pain 7 Pain Right Now 7 My pain is sharp and dull  In the last 24 hours, has pain interfered with the following? General activity 7 Relation with others 7 Enjoyment of life 7 What TIME of day is your pain at its worst? varies Sleep (in general) Poor  Pain is worse with: walking and standing Pain improves with: heat/ice and medication Relief from Meds: na  Mobility how many minutes can you walk? 2 do you drive?  no use a wheelchair  Function I need assistance with the following:  dressing, bathing, toileting, meal prep, household duties and shopping  Neuro/Psych trouble walking  Prior Studies Any changes since last visit?  no  Physicians involved in your care Any changes since last visit?  no   Family History  Problem Relation Age of Onset  . Heart murmur Mother   . Heart failure Mother   . Diabetes Mother   . Cirrhosis Father   . Alcoholism Father    Social History   Socioeconomic  History  . Marital status: Single    Spouse name: Not on file  . Number of children: Not on file  . Years of education: Not on file  . Highest education level: Not on file  Occupational History  . Not on file  Tobacco Use  . Smoking status: Never Smoker  . Smokeless tobacco: Never Used  Substance and Sexual Activity  . Alcohol use: Never  . Drug use: Never  . Sexual activity: Not on file  Other Topics Concern  . Not on file  Social History Narrative  . Not on file   Social Determinants of Health   Financial Resource Strain:   . Difficulty of Paying Living Expenses: Not on file  Food Insecurity:   . Worried About Charity fundraiser in the Last Year: Not on file  . Ran Out of Food in the Last Year: Not on file  Transportation Needs:   . Lack of Transportation (Medical): Not on file  . Lack of Transportation (Non-Medical): Not on file  Physical Activity:   . Days of Exercise per Week: Not on file  . Minutes of Exercise per Session: Not on file  Stress:   . Feeling of Stress : Not on file  Social Connections:   . Frequency of Communication with Friends and Family: Not on file  . Frequency of Social Gatherings with Friends and  Family: Not on file  . Attends Religious Services: Not on file  . Active Member of Clubs or Organizations: Not on file  . Attends Archivist Meetings: Not on file  . Marital Status: Not on file   Past Surgical History:  Procedure Laterality Date  . ABDOMINAL HYSTERECTOMY    . AORTIC ARCH ANGIOGRAPHY N/A 03/28/2019   Procedure: AORTIC ARCH ANGIOGRAPHY;  Surgeon: Waynetta Sandy, MD;  Location: [place] [facility];  Service: Cardiovascular;  Laterality: N/A;  . AV FISTULA PLACEMENT Left 06/13/2018   Procedure: ARTERIOVENOUS (AV) FISTULA CREATION;  Surgeon: Rosetta Posner, MD;  Location: [place];  Service: Vascular;  Laterality: Left;  . AV FISTULA PLACEMENT Left 08/18/2018   Procedure: INSERTION OF 4-7MM X 45CM ARTERIOVENOUS (AV)  GORE-TEX GRAFT LEFT  FOREARM;  Surgeon: Rosetta Posner, MD;  Location: [place];  Service: Vascular;  Laterality: Left;  . AV FISTULA PLACEMENT Right 04/06/2019   Procedure: INSERTION OF ARTERIOVENOUS (AV) GORE-TEX GRAFT RIGHT UPPER ARM;  Surgeon: Waynetta Sandy, MD;  Location: [place];  Service: Vascular;  Laterality: Right;  . AV FISTULA PLACEMENT Right 04/13/2019   Procedure: INSERTION OF ARTERIOVENOUS (AV) BOVINE  ARTEGRAFT GRAFT RIGHT UPPER EXTREMITY;  Surgeon: Serafina Mitchell, MD;  Location: [place];  Service: Vascular;  Laterality: Right;  . [place] REMOVAL Right 05/16/2019   Procedure: REMOVAL OF ARTERIOVENOUS GORETEX GRAFT ([place]) RIGHT ARM;  Surgeon: Angelia Mould, MD;  Location: [place];  Service: Vascular;  Laterality: Right;  . BASCILIC VEIN TRANSPOSITION Right 03/07/2019   Procedure: First Stage Bascilic Vein Transposition Right Arm;  Surgeon: Serafina Mitchell, MD;  Location: [place];  Service: Vascular;  Laterality: Right;  . CATARACT EXTRACTION Right 2005  . INSERTION OF DIALYSIS CATHETER N/A 08/18/2018   Procedure: INSERTION OF DIALYSIS CATHETER;  Surgeon: Rosetta Posner, MD;  Location: [place];  Service: Vascular;  Laterality: N/A;  . INSERTION OF DIALYSIS CATHETER Right 07/25/2019   Procedure: INSERTION OF DIALYSIS CATHETER RIGHT INTERNAL JUGULAR (TUNNELED);  Surgeon: Virl Cagey, MD;  Location: [facility];  Service: General;  Laterality: Right;  . IR FLUORO GUIDE CV LINE LEFT  10/26/2019  . IR FLUORO GUIDE CV LINE RIGHT  12/06/2018  . IR PTA ADDL CENTRAL DIALYSIS SEG THRU DIALY CIRCUIT RIGHT Right 12/06/2018  . IR REMOVAL TUN CV CATH W/O FL  10/23/2019  . IR US GUIDE VASC ACCESS LEFT  10/26/2019  . TEE WITHOUT CARDIOVERSION N/A 10/26/2019   Procedure: TRANSESOPHAGEAL ECHOCARDIOGRAM (TEE);  Surgeon: Lelon Perla, MD;  Location: [place];  Service: Cardiovascular;  Laterality: N/A;  . THROMBECTOMY AND REVISION OF ARTERIOVENTOUS (AV) GORETEX  GRAFT Left 09/29/2018   Procedure:  INSERTION OF ARTERIOVENTOUS (AV) GORETEX  GRAFT ARM;  Surgeon: Waynetta Sandy, MD;  Location: [place];  Service: Vascular;  Laterality: Left;  . UPPER EXTREMITY ANGIOGRAPHY Right 03/28/2019   Procedure: UPPER EXTREMITY ANGIOGRAPHY;  Surgeon: Waynetta Sandy, MD;  Location: [place] [facility];  Service: Cardiovascular;  Laterality: Right;  . VENOGRAM  10/27/2018   Procedure: Venogram;  Surgeon: Marty Heck, MD;  Location: [place] [facility];  Service: Cardiovascular;;  bilateral arm   Past Medical History:  Diagnosis Date  . Arthritis    hands  . Constipation   . Diabetes mellitus without complication (HCC)    Type II  . ESRD (end stage renal disease) ([place])     M/W/F- Hemodialysis  . GERD (gastroesophageal reflux disease)   .  Headache   . Hyperlipidemia   . Hypertension   . Irregular heart rate   . Stroke [facility])    TIA - approx 2010- no residual   BP (!) 155/98   Pulse 93   Temp 98 F (36.7 C)   Resp 14   SpO2 98%   Opioid Risk Score:   Fall Risk Score:  `1  Depression screen PHQ 2/9  Depression screen [facility] 2/9 12/19/2019 09/26/2019  Decreased Interest 0 0  Down, Depressed, Hopeless 0 0  PHQ - 2 Score 0 0   Review of Systems  Gastrointestinal: Positive for vomiting.  Musculoskeletal: Positive for gait problem.  All other systems reviewed and are negative.      Objective:   Physical Exam Constitutional: No distress . Vital signs reviewed. HENT: Normocephalic.  Atraumatic. Eyes: EOMI. No discharge. Cardiovascular: No JVD. Respiratory: Normal effort.  No stridor. GI: Non-distended. Skin: Warm and dry.  Intact. Psych: Normal mood.  Normal behavior. Musc: No edema in extremities.  No tenderness in extremities. Neuro: Alert and oriented x3 Motor:  RLE: HF, KE 4+/5, ADF 4+/5 LLE: HF, KE 4--4/5, ADF 4+/5 Prefers flexed position to relieve back pain.        Assessment & Plan:  1. Impaired gait with left side weakness secondary to right  basal ganglia infarction as well as small cortical infarct left frontal lobe             -Continue home PT and OT. 2. Antithrombotics:  -DVT/anticoagulation: Right IJ acute DVT. Plan anticoagulation x3 months     Has not been taking Eliquis as she ran out. Refilled. Advised regarding importance of taking this medication twice a day to prevent new stroke.   3. Left sided sciatica likely secondary to neurogenic claudication with impingement of L5 nerve root given lateral distribution and alleviation of pain with flexed positon.  Prescribed Neurontin 300mg  TID. Advised regarding side effect of drowsiness. Advised that if she feels too drowsy during the day she can take this medication only at night and it should help with her frequent awakening.              She found relief from Lidocaine patch, and advised that she can obtain this OTC.              Voltaren gel for left knee pain; provided refill.   Renewed oxycodone but advised to take as needed instead of q6H. Advised regarding addictive potential. Pain contract discussed and signed. Urine sample obtained.            4. Skin/Wound Care:              Santyl to sacral wound; provided refill. Being seen by wound care and healing well.   5. Hypertension. Imdur 30 mg daily             Coreg 12.5 mg twice daily  She is not taking as she ran out. Provided refills and educated regarding importance of taking to prevent stroke.   6. Constipation:             Feeling constipated. Advised her to take senna-docusate nightly. She has half a bottle left at home.   7. Sleep disturance             Refilled melatonin. Gabapentin should also help.     All medications reviewed and necessary medications refilled. All questions answered. Education provided. RTC in 1 month.

## 2019-12-20 DIAGNOSIS — N186 End stage renal disease: Secondary | ICD-10-CM | POA: Diagnosis not present

## 2019-12-20 DIAGNOSIS — Z992 Dependence on renal dialysis: Secondary | ICD-10-CM | POA: Diagnosis not present

## 2019-12-21 DIAGNOSIS — I120 Hypertensive chronic kidney disease with stage 5 chronic kidney disease or end stage renal disease: Secondary | ICD-10-CM | POA: Diagnosis not present

## 2019-12-21 DIAGNOSIS — N186 End stage renal disease: Secondary | ICD-10-CM | POA: Diagnosis not present

## 2019-12-21 DIAGNOSIS — D631 Anemia in chronic kidney disease: Secondary | ICD-10-CM | POA: Diagnosis not present

## 2019-12-21 DIAGNOSIS — E1122 Type 2 diabetes mellitus with diabetic chronic kidney disease: Secondary | ICD-10-CM | POA: Diagnosis not present

## 2019-12-21 DIAGNOSIS — M19041 Primary osteoarthritis, right hand: Secondary | ICD-10-CM | POA: Diagnosis not present

## 2019-12-21 DIAGNOSIS — I69354 Hemiplegia and hemiparesis following cerebral infarction affecting left non-dominant side: Secondary | ICD-10-CM | POA: Diagnosis not present

## 2019-12-22 DIAGNOSIS — Z992 Dependence on renal dialysis: Secondary | ICD-10-CM | POA: Diagnosis not present

## 2019-12-22 DIAGNOSIS — N186 End stage renal disease: Secondary | ICD-10-CM | POA: Diagnosis not present

## 2019-12-23 LAB — DRUG TOX MONITOR 1 W/CONF, ORAL FLD
Amphetamines: NEGATIVE ng/mL (ref <10)
Barbiturates: NEGATIVE ng/mL (ref <10)
Benzodiazepines: NEGATIVE ng/mL (ref <0.50)
Buprenorphine: NEGATIVE ng/mL (ref <0.10)
Cocaine: NEGATIVE ng/mL (ref <5.0)
Codeine: NEGATIVE ng/mL (ref <2.5)
Dihydrocodeine: NEGATIVE ng/mL (ref <2.5)
Fentanyl: NEGATIVE ng/mL (ref <0.10)
Heroin Metabolite: NEGATIVE ng/mL (ref <1.0)
Hydrocodone: NEGATIVE ng/mL (ref <2.5)
Hydromorphone: NEGATIVE ng/mL (ref <2.5)
MARIJUANA: NEGATIVE ng/mL (ref <2.5)
MDMA: NEGATIVE ng/mL (ref <10)
Meprobamate: NEGATIVE ng/mL (ref <2.5)
Methadone: NEGATIVE ng/mL (ref <5.0)
Morphine: NEGATIVE ng/mL (ref <2.5)
Nicotine Metabolite: NEGATIVE ng/mL (ref <5.0)
Norhydrocodone: NEGATIVE ng/mL (ref <2.5)
Noroxycodone: 22.5 ng/mL — ABNORMAL HIGH (ref <2.5)
Opiates: POSITIVE ng/mL — AB (ref <2.5)
Oxycodone: 180.2 ng/mL — ABNORMAL HIGH (ref <2.5)
Oxymorphone: NEGATIVE ng/mL (ref <2.5)
Phencyclidine: NEGATIVE ng/mL (ref <10)
Tapentadol: NEGATIVE ng/mL (ref <5.0)
Tramadol: NEGATIVE ng/mL (ref <5.0)
Zolpidem: NEGATIVE ng/mL (ref <5.0)

## 2019-12-23 LAB — DRUG TOX ALC METAB W/CON, ORAL FLD: Alcohol Metabolite: NEGATIVE ng/mL (ref <25)

## 2019-12-24 DIAGNOSIS — N186 End stage renal disease: Secondary | ICD-10-CM | POA: Diagnosis not present

## 2019-12-24 DIAGNOSIS — Z992 Dependence on renal dialysis: Secondary | ICD-10-CM | POA: Diagnosis not present

## 2019-12-25 DIAGNOSIS — M545 Low back pain: Secondary | ICD-10-CM | POA: Diagnosis not present

## 2019-12-25 DIAGNOSIS — Z992 Dependence on renal dialysis: Secondary | ICD-10-CM | POA: Diagnosis not present

## 2019-12-25 DIAGNOSIS — N186 End stage renal disease: Secondary | ICD-10-CM | POA: Diagnosis not present

## 2019-12-26 ENCOUNTER — Telehealth: Payer: Self-pay

## 2019-12-26 DIAGNOSIS — I120 Hypertensive chronic kidney disease with stage 5 chronic kidney disease or end stage renal disease: Secondary | ICD-10-CM | POA: Diagnosis not present

## 2019-12-26 DIAGNOSIS — D631 Anemia in chronic kidney disease: Secondary | ICD-10-CM | POA: Diagnosis not present

## 2019-12-26 DIAGNOSIS — M19041 Primary osteoarthritis, right hand: Secondary | ICD-10-CM | POA: Diagnosis not present

## 2019-12-26 DIAGNOSIS — I69354 Hemiplegia and hemiparesis following cerebral infarction affecting left non-dominant side: Secondary | ICD-10-CM | POA: Diagnosis not present

## 2019-12-26 DIAGNOSIS — N186 End stage renal disease: Secondary | ICD-10-CM | POA: Diagnosis not present

## 2019-12-26 DIAGNOSIS — E1122 Type 2 diabetes mellitus with diabetic chronic kidney disease: Secondary | ICD-10-CM | POA: Diagnosis not present

## 2019-12-26 NOTE — Telephone Encounter (Signed)
Thank you! Have moved her appointment up to Thursday

## 2019-12-26 NOTE — Telephone Encounter (Signed)
Caren Griffins called to report patient BP 190/100. She advised patient to go to ED but patient refused.

## 2019-12-27 DIAGNOSIS — N184 Chronic kidney disease, stage 4 (severe): Secondary | ICD-10-CM | POA: Diagnosis not present

## 2019-12-27 DIAGNOSIS — I10 Essential (primary) hypertension: Secondary | ICD-10-CM | POA: Diagnosis not present

## 2019-12-27 DIAGNOSIS — Z992 Dependence on renal dialysis: Secondary | ICD-10-CM | POA: Diagnosis not present

## 2019-12-27 DIAGNOSIS — E1165 Type 2 diabetes mellitus with hyperglycemia: Secondary | ICD-10-CM | POA: Diagnosis not present

## 2019-12-27 DIAGNOSIS — N186 End stage renal disease: Secondary | ICD-10-CM | POA: Diagnosis not present

## 2019-12-27 DIAGNOSIS — E7800 Pure hypercholesterolemia, unspecified: Secondary | ICD-10-CM | POA: Diagnosis not present

## 2019-12-27 DIAGNOSIS — D638 Anemia in other chronic diseases classified elsewhere: Secondary | ICD-10-CM | POA: Diagnosis not present

## 2019-12-27 DIAGNOSIS — K2100 Gastro-esophageal reflux disease with esophagitis, without bleeding: Secondary | ICD-10-CM | POA: Diagnosis not present

## 2019-12-27 DIAGNOSIS — E1122 Type 2 diabetes mellitus with diabetic chronic kidney disease: Secondary | ICD-10-CM | POA: Diagnosis not present

## 2019-12-28 ENCOUNTER — Other Ambulatory Visit: Payer: Self-pay

## 2019-12-28 ENCOUNTER — Telehealth: Payer: Self-pay

## 2019-12-28 ENCOUNTER — Encounter: Payer: Medicare PPO | Attending: Physical Medicine and Rehabilitation | Admitting: Physical Medicine and Rehabilitation

## 2019-12-28 ENCOUNTER — Encounter: Payer: Self-pay | Admitting: Physical Medicine and Rehabilitation

## 2019-12-28 VITALS — BP 182/107 | HR 85 | Temp 97.9°F

## 2019-12-28 DIAGNOSIS — E1122 Type 2 diabetes mellitus with diabetic chronic kidney disease: Secondary | ICD-10-CM | POA: Diagnosis not present

## 2019-12-28 DIAGNOSIS — I120 Hypertensive chronic kidney disease with stage 5 chronic kidney disease or end stage renal disease: Secondary | ICD-10-CM | POA: Diagnosis not present

## 2019-12-28 DIAGNOSIS — H547 Unspecified visual loss: Secondary | ICD-10-CM | POA: Insufficient documentation

## 2019-12-28 DIAGNOSIS — I69354 Hemiplegia and hemiparesis following cerebral infarction affecting left non-dominant side: Secondary | ICD-10-CM | POA: Diagnosis not present

## 2019-12-28 DIAGNOSIS — I10 Essential (primary) hypertension: Secondary | ICD-10-CM | POA: Diagnosis not present

## 2019-12-28 DIAGNOSIS — M48062 Spinal stenosis, lumbar region with neurogenic claudication: Secondary | ICD-10-CM

## 2019-12-28 DIAGNOSIS — Z5181 Encounter for therapeutic drug level monitoring: Secondary | ICD-10-CM | POA: Insufficient documentation

## 2019-12-28 DIAGNOSIS — L89150 Pressure ulcer of sacral region, unstageable: Secondary | ICD-10-CM | POA: Diagnosis not present

## 2019-12-28 DIAGNOSIS — I8290 Acute embolism and thrombosis of unspecified vein: Secondary | ICD-10-CM | POA: Insufficient documentation

## 2019-12-28 DIAGNOSIS — N186 End stage renal disease: Secondary | ICD-10-CM | POA: Diagnosis not present

## 2019-12-28 DIAGNOSIS — Z79891 Long term (current) use of opiate analgesic: Secondary | ICD-10-CM | POA: Insufficient documentation

## 2019-12-28 DIAGNOSIS — I639 Cerebral infarction, unspecified: Secondary | ICD-10-CM | POA: Diagnosis not present

## 2019-12-28 DIAGNOSIS — R531 Weakness: Secondary | ICD-10-CM | POA: Diagnosis not present

## 2019-12-28 DIAGNOSIS — D631 Anemia in chronic kidney disease: Secondary | ICD-10-CM | POA: Diagnosis not present

## 2019-12-28 DIAGNOSIS — R519 Headache, unspecified: Secondary | ICD-10-CM | POA: Diagnosis not present

## 2019-12-28 DIAGNOSIS — M19041 Primary osteoarthritis, right hand: Secondary | ICD-10-CM | POA: Diagnosis not present

## 2019-12-28 MED ORDER — ISOSORBIDE MONONITRATE ER 60 MG PO TB24: 60.0000 mg | ORAL_TABLET | Freq: Every day | ORAL | 1 refills | Status: DC

## 2019-12-28 MED ORDER — APIXABAN 5 MG PO TABS: 5.0000 mg | ORAL_TABLET | Freq: Two times a day (BID) | ORAL | 1 refills | Status: AC

## 2019-12-28 MED ORDER — ONDANSETRON HCL 4 MG PO TABS: 4.0000 mg | ORAL_TABLET | Freq: Three times a day (TID) | ORAL | 0 refills | Status: DC | PRN

## 2019-12-28 MED ORDER — ISOSORBIDE MONONITRATE ER 60 MG PO TB24: 60.0000 mg | ORAL_TABLET | Freq: Every day | ORAL | 1 refills | Status: AC

## 2019-12-28 NOTE — Progress Notes (Signed)
Subjective:    Patient ID: [name], female    DOB: Feb 11, 1951, 69 y.o.   MRN: [id number]  HPI   [name] presents for 2nd hospital follow-up after CIR admission for stroke.  Received phone call from her home therapy stating that her SBP was elevated to 190 during home therapy. Requested that her appointment be moved up for evaluation/treatment of HTN that is impairing her home therapy and increases her risk for stroke.   -Last visit she ran out of all her medications and needed refills. She had not been taking them for a few days. She has a PCP and her next appointment with him is in February. Her BP was severely elevated at last visit to 192/111. Improved to 156/97 when seated in office. Her son has been taking BP at home and the lowest was 0000000 systolic with a high of [id number]. Today in office it is 182/107.   -She has been having constant headache at home which she feels is due to her blood pressure. She has been taking her medications as prescribed.    -She has been receiving her home therapy 3 times per week and has been able to participate in some walking.   -Her son accompanies her to her visit and helps to provide history. He has also been walking with her frequently, but on days that she feels bad she does not get out of bed.    -Las visit she had fall in lobby prior to visit during transfer from wheelchair. Fell on coccyx and has some mild pain there. Did not hit her head and was feeling well during visit.    -She describes left leg pain that radiates from her back laterally down her leg. This prevents her from fully participating with therapy. She has never tried gabapentin.   -Her son also states that he was given warfarin when he went to her pharmacy after last visit, rather than the Eliquis 5mg  BID which she was discharged on from [place]. He states that she is not getting her INR regularly checked. He did not give her Warfarin yesterday evening because her blood pressure  was high.   She has been taking the Gabapentin as needed for lower back pain radiating into bilateral legs, but does not take often as it makes her too sleepy. She also has lidocaine patches at home and uses heat and a massager.   Pain Inventory Average Pain 7 Pain Right Now 7 My pain is sharp  In the last 24 hours, has pain interfered with the following? General activity 7 Relation with others 7 Enjoyment of life 7 What TIME of day is your pain at its worst? varies Sleep (in general) Fair  Pain is worse with: unsure Pain improves with: na Relief from Meds: na  Mobility how many minutes can you walk? 5 ability to climb steps?  no do you drive?  no use a wheelchair transfers alone  Function retired  Neuro/Psych trouble walking  Prior Studies Any changes since last visit?  no  Physicians involved in your care Any changes since last visit?  no   Family History  Problem Relation Age of Onset  . Heart murmur Mother   . Heart failure Mother   . Diabetes Mother   . Cirrhosis Father   . Alcoholism Father    Social History   Socioeconomic History  . Marital status: Single    Spouse name: Not on file  . Number of children: Not on file  .  Years of education: Not on file  . Highest education level: Not on file  Occupational History  . Not on file  Tobacco Use  . Smoking status: Never Smoker  . Smokeless tobacco: Never Used  Substance and Sexual Activity  . Alcohol use: Never  . Drug use: Never  . Sexual activity: Not on file  Other Topics Concern  . Not on file  Social History Narrative  . Not on file   Social Determinants of Health   Financial Resource Strain:   . Difficulty of Paying Living Expenses: Not on file  Food Insecurity:   . Worried About Charity fundraiser in the Last Year: Not on file  . Ran Out of Food in the Last Year: Not on file  Transportation Needs:   . Lack of Transportation (Medical): Not on file  . Lack of Transportation  (Non-Medical): Not on file  Physical Activity:   . Days of Exercise per Week: Not on file  . Minutes of Exercise per Session: Not on file  Stress:   . Feeling of Stress : Not on file  Social Connections:   . Frequency of Communication with Friends and Family: Not on file  . Frequency of Social Gatherings with Friends and Family: Not on file  . Attends Religious Services: Not on file  . Active Member of Clubs or Organizations: Not on file  . Attends Archivist Meetings: Not on file  . Marital Status: Not on file   Past Surgical History:  Procedure Laterality Date  . ABDOMINAL HYSTERECTOMY    . AORTIC ARCH ANGIOGRAPHY N/A 03/28/2019   Procedure: AORTIC ARCH ANGIOGRAPHY;  Surgeon: Waynetta Sandy, MD;  Location: [place] [facility];  Service: Cardiovascular;  Laterality: N/A;  . AV FISTULA PLACEMENT Left 06/13/2018   Procedure: ARTERIOVENOUS (AV) FISTULA CREATION;  Surgeon: Rosetta Posner, MD;  Location: [place];  Service: Vascular;  Laterality: Left;  . AV FISTULA PLACEMENT Left 08/18/2018   Procedure: INSERTION OF 4-7MM X 45CM ARTERIOVENOUS (AV) GORE-TEX GRAFT LEFT  FOREARM;  Surgeon: Rosetta Posner, MD;  Location: [place];  Service: Vascular;  Laterality: Left;  . AV FISTULA PLACEMENT Right 04/06/2019   Procedure: INSERTION OF ARTERIOVENOUS (AV) GORE-TEX GRAFT RIGHT UPPER ARM;  Surgeon: Waynetta Sandy, MD;  Location: [place];  Service: Vascular;  Laterality: Right;  . AV FISTULA PLACEMENT Right 04/13/2019   Procedure: INSERTION OF ARTERIOVENOUS (AV) BOVINE  ARTEGRAFT GRAFT RIGHT UPPER EXTREMITY;  Surgeon: Serafina Mitchell, MD;  Location: [place];  Service: Vascular;  Laterality: Right;  . [place] REMOVAL Right 05/16/2019   Procedure: REMOVAL OF ARTERIOVENOUS GORETEX GRAFT ([place]) RIGHT ARM;  Surgeon: Angelia Mould, MD;  Location: [place];  Service: Vascular;  Laterality: Right;  . BASCILIC VEIN TRANSPOSITION Right 03/07/2019   Procedure: First Stage Bascilic Vein  Transposition Right Arm;  Surgeon: Serafina Mitchell, MD;  Location: [place];  Service: Vascular;  Laterality: Right;  . CATARACT EXTRACTION Right 2005  . INSERTION OF DIALYSIS CATHETER N/A 08/18/2018   Procedure: INSERTION OF DIALYSIS CATHETER;  Surgeon: Rosetta Posner, MD;  Location: [place];  Service: Vascular;  Laterality: N/A;  . INSERTION OF DIALYSIS CATHETER Right 07/25/2019   Procedure: INSERTION OF DIALYSIS CATHETER RIGHT INTERNAL JUGULAR (TUNNELED);  Surgeon: Virl Cagey, MD;  [facility]: [facility];  Service: General;  Laterality: Right;  . IR FLUORO GUIDE CV LINE LEFT  10/26/2019  . IR FLUORO GUIDE CV LINE RIGHT  12/06/2018  .  IR PTA ADDL CENTRAL DIALYSIS SEG THRU DIALY CIRCUIT RIGHT Right 12/06/2018  . IR REMOVAL TUN CV CATH W/O FL  10/23/2019  . IR US GUIDE VASC ACCESS LEFT  10/26/2019  . TEE WITHOUT CARDIOVERSION N/A 10/26/2019   Procedure: TRANSESOPHAGEAL ECHOCARDIOGRAM (TEE);  Surgeon: Lelon Perla, MD;  Location: [place];  Service: Cardiovascular;  Laterality: N/A;  . THROMBECTOMY AND REVISION OF ARTERIOVENTOUS (AV) GORETEX  GRAFT Left 09/29/2018   Procedure: INSERTION OF ARTERIOVENTOUS (AV) GORETEX  GRAFT ARM;  Surgeon: Waynetta Sandy, MD;  Location: [place];  Service: Vascular;  Laterality: Left;  . UPPER EXTREMITY ANGIOGRAPHY Right 03/28/2019   Procedure: UPPER EXTREMITY ANGIOGRAPHY;  Surgeon: Waynetta Sandy, MD;  Location: [place] [facility];  Service: Cardiovascular;  Laterality: Right;  . VENOGRAM  10/27/2018   Procedure: Venogram;  Surgeon: Marty Heck, MD;  Location: [place] [facility];  Service: Cardiovascular;;  bilateral arm   Past Medical History:  Diagnosis Date  . Arthritis    hands  . Constipation   . Diabetes mellitus without complication (HCC)    Type II  . ESRD (end stage renal disease) ([place])     M/W/F- Hemodialysis  . GERD (gastroesophageal reflux disease)   . Headache   . Hyperlipidemia   . Hypertension   . Irregular heart  rate   . Stroke [facility])    TIA - approx 2010- no residual   BP (!) 182/107   Pulse 85   Temp 97.9 F (36.6 C)   SpO2 98%   Opioid Risk Score:   Fall Risk Score:  `1  Depression screen PHQ 2/9  Depression screen [facility] 2/9 12/19/2019 09/26/2019  Decreased Interest 0 0  Down, Depressed, Hopeless 0 0  PHQ - 2 Score 0 0    Review of Systems  Musculoskeletal: Positive for gait problem.  All other systems reviewed and are negative.      Objective:   Physical Exam Constitutional: Vital signs reviewed. In more distress than last visit. Head is lying on pillow. She appears to be in pain.  HENT: Normocephalic. Atraumatic. Eyes: EOMI. No discharge. Cardiovascular: No JVD. Respiratory: Normal effort. No stridor. GI: Non-distended. Skin: Warm and dry. Intact. Psych: Normal mood. Normal behavior. Musc: No edema in extremities. No tenderness in extremities. Neuro:Alert and oriented x3 Motor:  RLE: HF, KE 4+/5, ADF 4+/5 LLE: HF, KE 4--4/5, ADF 4+/5 Prefers flexed position to relieve back pain.     Assessment & Plan:  1. Impaired gait with left side weakness secondary to right basal ganglia infarction as well as small cortical infarct left frontal lobe -Continue home PT and OT. 2. Antithrombotics:  -DVT/anticoagulation: Right IJ acute DVT. Plan anticoagulation x3 months             Last visit she had not been taking Eliquis as she ran out. I refilled last visit but son said the pharmacy gave him Coumadin instead. She was discharged from the CIR on Eliquis 5mg  BID and this is the medication that I provided to her on 1/26. Advised son to pick up Eliquis from pharmacy today and give her 5mg  tonight. Advised that her anticoagulant should not be stopped if her BP is high as it is important to prevent her from having another stroke and will not raise her BP.   3. Left sided sciatica likely secondary to neurogenic claudication with impingement of L5 nerve root given  lateral distribution and alleviation of pain with flexed positon.  Prescribed Neurontin 300mg  TID last visit. Advised regarding side effect of drowsiness. Advised that if she feels too drowsy during the day she can take this medication only at night and it should help with her frequent awakening, and this is what she has been doing as it has made her drowsy.   She found relief fromLidocaine patch, continue this.  Voltaren gel for left knee pain; provided refill last visit.              Renewed oxycodone last visit but advised to take as needed instead of q6H. Advised regarding addictive potential. Pain contract discussed and signed. Urine sample obtained last visit and was personally reviewed by me and within expected boundaries.   4. Skin/Wound Care:  Continue Santyl to sacral wound; provided refill last visit. Being seen by wound care and healing well.   5. Hypertension.   Given severe elevations and headaches, increase Imdur to 60 mg daily. Advised to call me if her headache persists as we may need repeat imaging to r/o new stroke. Also advised to call me with BP checks at home so we can adjust medication as needed.  Coreg 12.5 mg twice daily            Educated regarding importance of taking to prevent stroke.    6. Constipation: Feeling constipated. Advised her to take senna-docusate nightly. She has half a bottle left at home.   7. Sleep disturance Continue Gabapentin for both pain and insomnia   All questions answered. Education provided. RTC in 2 weeks.

## 2019-12-28 NOTE — Telephone Encounter (Signed)
Amy from [place] called stating insurance company needs to speak with doctor about Eliquis sent in for patient and previously on Coumadin. Call [phone]. Possible need PA

## 2019-12-29 DIAGNOSIS — N186 End stage renal disease: Secondary | ICD-10-CM | POA: Diagnosis not present

## 2019-12-29 DIAGNOSIS — Z992 Dependence on renal dialysis: Secondary | ICD-10-CM | POA: Diagnosis not present

## 2019-12-29 NOTE — Telephone Encounter (Signed)
Insurance needs to do a peer to peer with you for patients Eliquis. Call [phone]

## 2019-12-29 NOTE — Telephone Encounter (Signed)
I called this number to do the prior auth or peer to peer but patient was not in system. I will call patient as advise to take Coumadin for now

## 2019-12-29 NOTE — Telephone Encounter (Signed)
Called both numbers in patient's chart. No response and mailboxes are full

## 2020-01-01 DIAGNOSIS — N186 End stage renal disease: Secondary | ICD-10-CM | POA: Diagnosis not present

## 2020-01-01 DIAGNOSIS — Z992 Dependence on renal dialysis: Secondary | ICD-10-CM | POA: Diagnosis not present

## 2020-01-02 ENCOUNTER — Inpatient Hospital Stay: Payer: Medicare PPO | Admitting: Adult Health

## 2020-01-02 DIAGNOSIS — I10 Essential (primary) hypertension: Secondary | ICD-10-CM | POA: Diagnosis not present

## 2020-01-02 DIAGNOSIS — Z0001 Encounter for general adult medical examination with abnormal findings: Secondary | ICD-10-CM | POA: Diagnosis not present

## 2020-01-02 DIAGNOSIS — N186 End stage renal disease: Secondary | ICD-10-CM | POA: Diagnosis not present

## 2020-01-02 DIAGNOSIS — M4640 Discitis, unspecified, site unspecified: Secondary | ICD-10-CM | POA: Diagnosis not present

## 2020-01-02 DIAGNOSIS — D631 Anemia in chronic kidney disease: Secondary | ICD-10-CM | POA: Diagnosis not present

## 2020-01-02 DIAGNOSIS — E1122 Type 2 diabetes mellitus with diabetic chronic kidney disease: Secondary | ICD-10-CM | POA: Diagnosis not present

## 2020-01-02 DIAGNOSIS — I120 Hypertensive chronic kidney disease with stage 5 chronic kidney disease or end stage renal disease: Secondary | ICD-10-CM | POA: Diagnosis not present

## 2020-01-02 DIAGNOSIS — I69354 Hemiplegia and hemiparesis following cerebral infarction affecting left non-dominant side: Secondary | ICD-10-CM | POA: Diagnosis not present

## 2020-01-02 DIAGNOSIS — B965 Pseudomonas (aeruginosa) (mallei) (pseudomallei) as the cause of diseases classified elsewhere: Secondary | ICD-10-CM | POA: Diagnosis not present

## 2020-01-02 DIAGNOSIS — M19041 Primary osteoarthritis, right hand: Secondary | ICD-10-CM | POA: Diagnosis not present

## 2020-01-02 DIAGNOSIS — R7881 Bacteremia: Secondary | ICD-10-CM | POA: Diagnosis not present

## 2020-01-02 DIAGNOSIS — G8190 Hemiplegia, unspecified affecting unspecified side: Secondary | ICD-10-CM | POA: Diagnosis not present

## 2020-01-03 ENCOUNTER — Inpatient Hospital Stay: Payer: Medicare PPO | Admitting: Internal Medicine

## 2020-01-03 DIAGNOSIS — Z992 Dependence on renal dialysis: Secondary | ICD-10-CM | POA: Diagnosis not present

## 2020-01-03 DIAGNOSIS — N186 End stage renal disease: Secondary | ICD-10-CM | POA: Diagnosis not present

## 2020-01-03 NOTE — Telephone Encounter (Signed)
Spoke with provider after gathering information and patients formulary in regards to her medication of Eliquis.  That medication is not on the formulary at all but the alternative of warfarin is and is no prior auth required as well.   Dr. Alfonso Patten has agreed and stated she will try to contact the patient again to change the medication and to reinforce her to go to the INR clinic for follow ups in that regard.

## 2020-01-04 DIAGNOSIS — D631 Anemia in chronic kidney disease: Secondary | ICD-10-CM | POA: Diagnosis not present

## 2020-01-04 DIAGNOSIS — I69354 Hemiplegia and hemiparesis following cerebral infarction affecting left non-dominant side: Secondary | ICD-10-CM | POA: Diagnosis not present

## 2020-01-04 DIAGNOSIS — M19041 Primary osteoarthritis, right hand: Secondary | ICD-10-CM | POA: Diagnosis not present

## 2020-01-04 DIAGNOSIS — E1122 Type 2 diabetes mellitus with diabetic chronic kidney disease: Secondary | ICD-10-CM | POA: Diagnosis not present

## 2020-01-04 DIAGNOSIS — I120 Hypertensive chronic kidney disease with stage 5 chronic kidney disease or end stage renal disease: Secondary | ICD-10-CM | POA: Diagnosis not present

## 2020-01-04 DIAGNOSIS — M545 Low back pain: Secondary | ICD-10-CM | POA: Diagnosis not present

## 2020-01-04 DIAGNOSIS — N186 End stage renal disease: Secondary | ICD-10-CM | POA: Diagnosis not present

## 2020-01-05 DIAGNOSIS — D631 Anemia in chronic kidney disease: Secondary | ICD-10-CM | POA: Diagnosis not present

## 2020-01-05 DIAGNOSIS — Z992 Dependence on renal dialysis: Secondary | ICD-10-CM | POA: Diagnosis not present

## 2020-01-05 DIAGNOSIS — M19041 Primary osteoarthritis, right hand: Secondary | ICD-10-CM | POA: Diagnosis not present

## 2020-01-05 DIAGNOSIS — I69354 Hemiplegia and hemiparesis following cerebral infarction affecting left non-dominant side: Secondary | ICD-10-CM | POA: Diagnosis not present

## 2020-01-05 DIAGNOSIS — N186 End stage renal disease: Secondary | ICD-10-CM | POA: Diagnosis not present

## 2020-01-05 DIAGNOSIS — I120 Hypertensive chronic kidney disease with stage 5 chronic kidney disease or end stage renal disease: Secondary | ICD-10-CM | POA: Diagnosis not present

## 2020-01-05 DIAGNOSIS — E1122 Type 2 diabetes mellitus with diabetic chronic kidney disease: Secondary | ICD-10-CM | POA: Diagnosis not present

## 2020-01-06 DIAGNOSIS — G8194 Hemiplegia, unspecified affecting left nondominant side: Secondary | ICD-10-CM | POA: Diagnosis not present

## 2020-01-06 DIAGNOSIS — I639 Cerebral infarction, unspecified: Secondary | ICD-10-CM | POA: Diagnosis not present

## 2020-01-06 DIAGNOSIS — I330 Acute and subacute infective endocarditis: Secondary | ICD-10-CM | POA: Diagnosis not present

## 2020-01-06 DIAGNOSIS — N186 End stage renal disease: Secondary | ICD-10-CM | POA: Diagnosis not present

## 2020-01-08 DIAGNOSIS — Z992 Dependence on renal dialysis: Secondary | ICD-10-CM | POA: Diagnosis not present

## 2020-01-08 DIAGNOSIS — N186 End stage renal disease: Secondary | ICD-10-CM | POA: Diagnosis not present

## 2020-01-09 DIAGNOSIS — I120 Hypertensive chronic kidney disease with stage 5 chronic kidney disease or end stage renal disease: Secondary | ICD-10-CM | POA: Diagnosis not present

## 2020-01-09 DIAGNOSIS — I69354 Hemiplegia and hemiparesis following cerebral infarction affecting left non-dominant side: Secondary | ICD-10-CM | POA: Diagnosis not present

## 2020-01-09 DIAGNOSIS — M545 Low back pain: Secondary | ICD-10-CM | POA: Diagnosis not present

## 2020-01-09 DIAGNOSIS — M19041 Primary osteoarthritis, right hand: Secondary | ICD-10-CM | POA: Diagnosis not present

## 2020-01-09 DIAGNOSIS — E1122 Type 2 diabetes mellitus with diabetic chronic kidney disease: Secondary | ICD-10-CM | POA: Diagnosis not present

## 2020-01-09 DIAGNOSIS — N186 End stage renal disease: Secondary | ICD-10-CM | POA: Diagnosis not present

## 2020-01-09 DIAGNOSIS — D631 Anemia in chronic kidney disease: Secondary | ICD-10-CM | POA: Diagnosis not present

## 2020-01-10 DIAGNOSIS — Z992 Dependence on renal dialysis: Secondary | ICD-10-CM | POA: Diagnosis not present

## 2020-01-10 DIAGNOSIS — N186 End stage renal disease: Secondary | ICD-10-CM | POA: Diagnosis not present

## 2020-01-11 ENCOUNTER — Encounter (HOSPITAL_BASED_OUTPATIENT_CLINIC_OR_DEPARTMENT_OTHER): Payer: Medicare PPO | Admitting: Physical Medicine and Rehabilitation

## 2020-01-11 ENCOUNTER — Encounter: Payer: Self-pay | Admitting: Physical Medicine and Rehabilitation

## 2020-01-11 ENCOUNTER — Other Ambulatory Visit: Payer: Self-pay

## 2020-01-11 VITALS — Ht 67.0 in | Wt 138.0 lb

## 2020-01-11 DIAGNOSIS — L89893 Pressure ulcer of other site, stage 3: Secondary | ICD-10-CM | POA: Diagnosis not present

## 2020-01-11 DIAGNOSIS — H547 Unspecified visual loss: Secondary | ICD-10-CM

## 2020-01-11 DIAGNOSIS — M48062 Spinal stenosis, lumbar region with neurogenic claudication: Secondary | ICD-10-CM | POA: Diagnosis not present

## 2020-01-11 DIAGNOSIS — I639 Cerebral infarction, unspecified: Secondary | ICD-10-CM | POA: Diagnosis not present

## 2020-01-11 DIAGNOSIS — R519 Headache, unspecified: Secondary | ICD-10-CM | POA: Diagnosis not present

## 2020-01-11 DIAGNOSIS — M25561 Pain in right knee: Secondary | ICD-10-CM

## 2020-01-11 DIAGNOSIS — G8929 Other chronic pain: Secondary | ICD-10-CM

## 2020-01-11 DIAGNOSIS — I10 Essential (primary) hypertension: Secondary | ICD-10-CM

## 2020-01-11 DIAGNOSIS — L89300 Pressure ulcer of unspecified buttock, unstageable: Secondary | ICD-10-CM | POA: Diagnosis not present

## 2020-01-11 MED ORDER — DICLOFENAC SODIUM 1 % EX GEL: 2.0000 g | Freq: Four times a day (QID) | CUTANEOUS | 1 refills | Status: DC

## 2020-01-11 NOTE — Progress Notes (Signed)
Subjective:    Patient ID: [name], female    DOB: 1951/03/04, 69 y.o.   MRN: [id number]  HPI  Due to national recommendations of social distancing because of COVID 20, an audio/video tele-health visit is felt to be the most appropriate encounter for this patient at this time. See MyChart message from today for the patient's consent to a tele-health encounter with [facility]. This is a follow up tele-visit via phone. The patient is at home. MD is at office.   [name] presents for follow-up  for impaired mobility and ADLs following stroke.  Medication Management/HTN: -On her first visit she ran out of all her medications and needed refills. She had not been taking them for a few days. She has a PCP and her next appointment with him is in February. Her BP was severely elevated on her first visit to 192/111. Improved to 156/97 when seated in office. Her son has been taking BP at home and the lowest was 0000000 systolic with a high of [id number]. On her last visit in office it was 182/107. We increased her Imdur to 60mg  daily and her son has since been taking her BP every day at home and it has been stable.   -On her last visit she had been having constant headache at home which she felt was due to her blood pressure. She no longer reports any headache once her Bps have been better controlled.   -She has been receiving her home therapy 3 times per week and has been able to participate in walking and exercises with continued improvements in mobility.  -Her son accompanies her on the phone and helps to provide history. He has also been walking with her frequently, but on days that she feels bad she does not get out of bed.   -She describes left leg pain that radiates from her back laterally down her leg. This prevents her from fully participating with therapy. She has been taking the Gabapentin 300mg  twice per day and it does help with the pain. It helps  her to sleep better at night but she is still struggling with insomnia.   -Her son has started giving her the Eliquis again 5mg  BID. Once he runs out since the insurance will not approve more Eliquis he will transition to Warfarin.   She requires refill of her Diclofenac gel for her knee pain. She also notes worsening vision  Pain Inventory Average Pain 7 Pain Right Now 8 My pain is sharp and stabbing  In the last 24 hours, has pain interfered with the following? General activity 7 Relation with others 7 Enjoyment of life 7 What TIME of day is your pain at its worst? daytime Sleep (in general) Good  Pain is worse with: walking, bending, standing and some activites Pain improves with: rest and medication Relief from Meds: 7  Mobility use a walker ability to climb steps?  no do you drive?  no use a wheelchair  Function disabled: date disabled . I need assistance with the following:  dressing, bathing, meal prep, household duties and shopping  Neuro/Psych weakness numbness tremor tingling trouble walking  Prior Studies Any changes since last visit?  no  Physicians involved in your care Any changes since last visit?  no   Family History  Problem Relation Age of Onset  . Heart murmur Mother   . Heart failure Mother   . Diabetes Mother   . Cirrhosis Father   .  Alcoholism Father    Social History   Socioeconomic History  . Marital status: Single    Spouse name: Not on file  . Number of children: Not on file  . Years of education: Not on file  . Highest education level: Not on file  Occupational History  . Not on file  Tobacco Use  . Smoking status: Never Smoker  . Smokeless tobacco: Never Used  Substance and Sexual Activity  . Alcohol use: Never  . Drug use: Never  . Sexual activity: Not on file  Other Topics Concern  . Not on file  Social History Narrative  . Not on file   Social Determinants of Health   Financial Resource Strain:   .  Difficulty of Paying Living Expenses: Not on file  Food Insecurity:   . Worried About Charity fundraiser in the Last Year: Not on file  . Ran Out of Food in the Last Year: Not on file  Transportation Needs:   . Lack of Transportation (Medical): Not on file  . Lack of Transportation (Non-Medical): Not on file  Physical Activity:   . Days of Exercise per Week: Not on file  . Minutes of Exercise per Session: Not on file  Stress:   . Feeling of Stress : Not on file  Social Connections:   . Frequency of Communication with Friends and Family: Not on file  . Frequency of Social Gatherings with Friends and Family: Not on file  . Attends Religious Services: Not on file  . Active Member of Clubs or Organizations: Not on file  . Attends Archivist Meetings: Not on file  . Marital Status: Not on file   Past Surgical History:  Procedure Laterality Date  . ABDOMINAL HYSTERECTOMY    . AORTIC ARCH ANGIOGRAPHY N/A 03/28/2019   Procedure: AORTIC ARCH ANGIOGRAPHY;  Surgeon: Waynetta Sandy, MD;  Location: [facility];  Service: Cardiovascular;  Laterality: N/A;  . AV FISTULA PLACEMENT Left 06/13/2018   Procedure: ARTERIOVENOUS (AV) FISTULA CREATION;  Surgeon: Rosetta Posner, MD;  Location: [facility];  Service: Vascular;  Laterality: Left;  . AV FISTULA PLACEMENT Left 08/18/2018   Procedure: INSERTION OF 4-7MM X 45CM ARTERIOVENOUS (AV) GORE-TEX GRAFT LEFT  FOREARM;  Surgeon: Rosetta Posner, MD;  Location: [facility];  Service: Vascular;  Laterality: Left;  . AV FISTULA PLACEMENT Right 04/06/2019   Procedure: INSERTION OF ARTERIOVENOUS (AV) GORE-TEX GRAFT RIGHT UPPER ARM;  Surgeon: Waynetta Sandy, MD;  Location: [facility];  Service: Vascular;  Laterality: Right;  . AV FISTULA PLACEMENT Right 04/13/2019   Procedure: INSERTION OF ARTERIOVENOUS (AV) BOVINE  ARTEGRAFT GRAFT RIGHT UPPER EXTREMITY;  Surgeon: Serafina Mitchell, MD;  Location: [facility];  Service: Vascular;  Laterality: Right;  .  [place] REMOVAL Right 05/16/2019   Procedure: REMOVAL OF ARTERIOVENOUS GORETEX GRAFT ([place]) RIGHT ARM;  Surgeon: Angelia Mould, MD;  Location: [facility];  Service: Vascular;  Laterality: Right;  . BASCILIC VEIN TRANSPOSITION Right 03/07/2019   Procedure: First Stage Bascilic Vein Transposition Right Arm;  Surgeon: Serafina Mitchell, MD;  Location: [facility];  Service: Vascular;  Laterality: Right;  . CATARACT EXTRACTION Right 2005  . INSERTION OF DIALYSIS CATHETER N/A 08/18/2018   Procedure: INSERTION OF DIALYSIS CATHETER;  Surgeon: Rosetta Posner, MD;  Location: [facility];  Service: Vascular;  Laterality: N/A;  . INSERTION OF DIALYSIS CATHETER Right 07/25/2019   Procedure: INSERTION OF DIALYSIS CATHETER RIGHT INTERNAL JUGULAR (TUNNELED);  Surgeon:  Virl Cagey, MD;  Location: [facility];  Service: General;  Laterality: Right;  . IR FLUORO GUIDE CV LINE LEFT  10/26/2019  . IR FLUORO GUIDE CV LINE RIGHT  12/06/2018  . IR PTA ADDL CENTRAL DIALYSIS SEG THRU DIALY CIRCUIT RIGHT Right 12/06/2018  . IR REMOVAL TUN CV CATH W/O FL  10/23/2019  . IR US GUIDE VASC ACCESS LEFT  10/26/2019  . TEE WITHOUT CARDIOVERSION N/A 10/26/2019   Procedure: TRANSESOPHAGEAL ECHOCARDIOGRAM (TEE);  Surgeon: Lelon Perla, MD;  Location: [facility];  Service: Cardiovascular;  Laterality: N/A;  . THROMBECTOMY AND REVISION OF ARTERIOVENTOUS (AV) GORETEX  GRAFT Left 09/29/2018   Procedure: INSERTION OF ARTERIOVENTOUS (AV) GORETEX  GRAFT ARM;  Surgeon: Waynetta Sandy, MD;  Location: [facility];  Service: Vascular;  Laterality: Left;  . UPPER EXTREMITY ANGIOGRAPHY Right 03/28/2019   Procedure: UPPER EXTREMITY ANGIOGRAPHY;  Surgeon: Waynetta Sandy, MD;  Location: [facility];  Service: Cardiovascular;  Laterality: Right;  . VENOGRAM  10/27/2018   Procedure: Venogram;  Surgeon: Marty Heck, MD;  Location: [facility];  Service: Cardiovascular;;  bilateral arm   Past Medical History:  Diagnosis Date    . Arthritis    hands  . Constipation   . Diabetes mellitus without complication (HCC)    Type II  . ESRD (end stage renal disease) ([place])     M/W/F- Hemodialysis  . GERD (gastroesophageal reflux disease)   . Headache   . Hyperlipidemia   . Hypertension   . Irregular heart rate   . Stroke [facility])    TIA - approx 2010- no residual   There were no vitals taken for this visit.  Opioid Risk Score:   Fall Risk Score:  `1  Depression screen PHQ 2/9  Depression screen [facility] 2/9 12/19/2019 09/26/2019  Decreased Interest 0 0  Down, Depressed, Hopeless 0 0  PHQ - 2 Score 0 0     Review of Systems  Constitutional: Negative.   HENT: Negative.   Eyes: Negative.   Respiratory: Negative.   Cardiovascular: Negative.   Gastrointestinal: Negative.   Endocrine: Negative.   Genitourinary: Negative.   Musculoskeletal: Positive for arthralgias and gait problem.  Skin: Negative.   Allergic/Immunologic: Negative.   Neurological: Positive for tremors, weakness and numbness.  Hematological: Negative.   Psychiatric/Behavioral: Negative.   All other systems reviewed and are negative.      Objective:   Physical Exam Not performed given telephone visit.        Assessment & Plan:  1. Impaired gait with left side weakness secondary to right basal ganglia infarction as well as small cortical infarct left frontal lobe -Continue home PT and OT. Her son has also set her up for outpatient therapy as well.  2. Antithrombotics: -DVT/anticoagulation: Right IJ acute DVT. Plan anticoagulation x3 months Discussed with son. He will continue to give her remaining Eliquis and then will transition to Coumadin since Eliquis is not covered for her. Advised that she will need biweekly INR checks while on warfarin. She does have a PCP and had follow-up with Dr. Quintin Alto today.   3.Left sided sciatica likely secondary to neurogenic claudication with impingement of L5 nerve root  given lateral distribution and alleviation of pain with flexed positon. Prescribed Neurontin 300mg  TID last visit. Advised regarding side effect of drowsiness. Advised that if she feels too drowsy during the day she can take this medication only at night and it should help with her frequent awakening,  and this is what she has been doing as it has made her drowsy. She is currently taking it BID and getting relief but still has difficulty sleeping at night. Advised that she can take two pills at night. Since she has kidney disease, she may feel the effects of the medication for than typical since it is processed by the kidney.   She found relief fromLidocaine patch, continue this.  Voltaren gelfor left knee pain; provided refill today. No longer requiring oxycodone.     4. Skin/Wound Care:  Continue Santyl to sacral wound. Being seen by wound care and healing well.   5. Hypertension.              Improved with increased Imdur dose to 60mg .   Coreg12.5mg  twice daily Educated regarding importance of taking to prevent stroke and son is now taking her BP every day.   6. Sleep disturance Continue Gabapentin for both pain and insomnia. See above regarding dose change.   7. Headaches: resolved, were likely 2/2 HTN.  8. Worsening vision: provided referral to ophthalmology.  Fifteen minutes of face to face patient care time were spent during this visit. All questions were encouraged and answered. Education provided. RTC in 2 months.

## 2020-01-12 DIAGNOSIS — N186 End stage renal disease: Secondary | ICD-10-CM | POA: Diagnosis not present

## 2020-01-12 DIAGNOSIS — Z992 Dependence on renal dialysis: Secondary | ICD-10-CM | POA: Diagnosis not present

## 2020-01-15 DIAGNOSIS — N186 End stage renal disease: Secondary | ICD-10-CM | POA: Diagnosis not present

## 2020-01-15 DIAGNOSIS — Z992 Dependence on renal dialysis: Secondary | ICD-10-CM | POA: Diagnosis not present

## 2020-01-16 ENCOUNTER — Other Ambulatory Visit: Payer: Self-pay | Admitting: Cardiothoracic Surgery

## 2020-01-16 ENCOUNTER — Ambulatory Visit: Payer: Medicare PPO | Admitting: Physical Medicine and Rehabilitation

## 2020-01-16 DIAGNOSIS — I120 Hypertensive chronic kidney disease with stage 5 chronic kidney disease or end stage renal disease: Secondary | ICD-10-CM | POA: Diagnosis not present

## 2020-01-16 DIAGNOSIS — I058 Other rheumatic mitral valve diseases: Secondary | ICD-10-CM

## 2020-01-16 DIAGNOSIS — D631 Anemia in chronic kidney disease: Secondary | ICD-10-CM | POA: Diagnosis not present

## 2020-01-16 DIAGNOSIS — I69354 Hemiplegia and hemiparesis following cerebral infarction affecting left non-dominant side: Secondary | ICD-10-CM | POA: Diagnosis not present

## 2020-01-16 DIAGNOSIS — E1122 Type 2 diabetes mellitus with diabetic chronic kidney disease: Secondary | ICD-10-CM | POA: Diagnosis not present

## 2020-01-16 DIAGNOSIS — I059 Rheumatic mitral valve disease, unspecified: Secondary | ICD-10-CM

## 2020-01-16 DIAGNOSIS — M19041 Primary osteoarthritis, right hand: Secondary | ICD-10-CM | POA: Diagnosis not present

## 2020-01-16 DIAGNOSIS — N186 End stage renal disease: Secondary | ICD-10-CM | POA: Diagnosis not present

## 2020-01-17 ENCOUNTER — Ambulatory Visit: Payer: Medicare PPO | Admitting: Cardiothoracic Surgery

## 2020-01-17 DIAGNOSIS — Z992 Dependence on renal dialysis: Secondary | ICD-10-CM | POA: Diagnosis not present

## 2020-01-17 DIAGNOSIS — N186 End stage renal disease: Secondary | ICD-10-CM | POA: Diagnosis not present

## 2020-01-18 DIAGNOSIS — M545 Low back pain: Secondary | ICD-10-CM | POA: Diagnosis not present

## 2020-01-19 DIAGNOSIS — Z992 Dependence on renal dialysis: Secondary | ICD-10-CM | POA: Diagnosis not present

## 2020-01-19 DIAGNOSIS — N186 End stage renal disease: Secondary | ICD-10-CM | POA: Diagnosis not present

## 2020-01-19 DIAGNOSIS — E1122 Type 2 diabetes mellitus with diabetic chronic kidney disease: Secondary | ICD-10-CM | POA: Diagnosis not present

## 2020-01-19 DIAGNOSIS — I10 Essential (primary) hypertension: Secondary | ICD-10-CM | POA: Diagnosis not present

## 2020-01-21 DIAGNOSIS — N186 End stage renal disease: Secondary | ICD-10-CM | POA: Diagnosis not present

## 2020-01-21 DIAGNOSIS — Z992 Dependence on renal dialysis: Secondary | ICD-10-CM | POA: Diagnosis not present

## 2020-01-22 DIAGNOSIS — N186 End stage renal disease: Secondary | ICD-10-CM | POA: Diagnosis not present

## 2020-01-22 DIAGNOSIS — Z992 Dependence on renal dialysis: Secondary | ICD-10-CM | POA: Diagnosis not present

## 2020-01-23 ENCOUNTER — Other Ambulatory Visit: Payer: Self-pay

## 2020-01-23 ENCOUNTER — Encounter: Payer: Self-pay | Admitting: Adult Health

## 2020-01-23 ENCOUNTER — Ambulatory Visit: Payer: Medicare PPO | Admitting: Adult Health

## 2020-01-23 VITALS — BP 154/96 | HR 101 | Temp 97.6°F | Ht 67.0 in | Wt 145.6 lb

## 2020-01-23 DIAGNOSIS — I120 Hypertensive chronic kidney disease with stage 5 chronic kidney disease or end stage renal disease: Secondary | ICD-10-CM | POA: Diagnosis not present

## 2020-01-23 DIAGNOSIS — N186 End stage renal disease: Secondary | ICD-10-CM | POA: Diagnosis not present

## 2020-01-23 DIAGNOSIS — M545 Low back pain: Secondary | ICD-10-CM | POA: Diagnosis not present

## 2020-01-23 DIAGNOSIS — I639 Cerebral infarction, unspecified: Secondary | ICD-10-CM

## 2020-01-23 DIAGNOSIS — I10 Essential (primary) hypertension: Secondary | ICD-10-CM | POA: Diagnosis not present

## 2020-01-23 DIAGNOSIS — D631 Anemia in chronic kidney disease: Secondary | ICD-10-CM | POA: Diagnosis not present

## 2020-01-23 DIAGNOSIS — I6381 Other cerebral infarction due to occlusion or stenosis of small artery: Secondary | ICD-10-CM

## 2020-01-23 DIAGNOSIS — E119 Type 2 diabetes mellitus without complications: Secondary | ICD-10-CM

## 2020-01-23 DIAGNOSIS — L89153 Pressure ulcer of sacral region, stage 3: Secondary | ICD-10-CM | POA: Diagnosis not present

## 2020-01-23 DIAGNOSIS — I69354 Hemiplegia and hemiparesis following cerebral infarction affecting left non-dominant side: Secondary | ICD-10-CM | POA: Diagnosis not present

## 2020-01-23 DIAGNOSIS — I82C12 Acute embolism and thrombosis of left internal jugular vein: Secondary | ICD-10-CM

## 2020-01-23 DIAGNOSIS — E1122 Type 2 diabetes mellitus with diabetic chronic kidney disease: Secondary | ICD-10-CM | POA: Diagnosis not present

## 2020-01-23 NOTE — Progress Notes (Signed)
[facility] [address] [place] [address] [phone]       HOSPITAL FOLLOW UP NOTE  [name] Date of Birth:  06-25-1951 Medical Record Number:  [id number]   Reason for Referral:  hospital stroke follow up    CHIEF COMPLAINT:  Chief Complaint  Patient presents with  . Hospitalization Follow-up    Son present. Treatment room.  Patient has been having issues with her left eye. She mentioned that she has been having pain to her back and legs.    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  History obtained from patient, son and chart review. Reviewed all radiology images and labs personally.  DianaQuorra Romero a 69 y.o.femalewith history of ESRD ( on dialysis M-W-F), L3-4 discitis/osteomyelitis and Pseudomonas bacteremia (06/2019), TIA 2010, HTN, HLD, Ha, DM2who presented to [facility] on 10/20/2019 with c/o dizziness, left arm weakness and left leg numbness.Shedid not receive IV t-PA due to late presentation (>4.5 hours from time of onset).  Shortly transferred to [facility] ED and evaluated by stroke team and Dr. Erlinda Hong with stroke work-up revealing acute right BG, CR and caudate scattered infarcts embolic pattern secondary to MVA vegetation/endocarditis.  On 12/4, found to have altered mental status with repeat CT head showing new small frontal lobe white matter infarct new from prior imaging.  Bilateral upper extremity venous Doppler showed right IJ acute DVT, and right brachial vein age indeterminate DVT.  Lower extremity venous Doppler negative.  TEE showed large vegetation of anterior MV leaflet.  Previously on aspirin and Plavix and transition to warfarin recommended by primary team.  Right IJ acute DVT felt to be related to dialysis catheter with removal of catheter for line holiday.  Previously right arm AV graft  removed 04/2019 therefore had tunneled catheter placed to continue HD treatments.  Blood cultures positive for Pseudomonas and initiated on cefepime with prior Pseudomonas infection 06/2019 and repeat blood culture 08/2019.  Also noted for MV endocarditis, placed on antibiotics per ID recommendations.  History of HTN stable.  LDL 65.  DM type II with A1c 7.6.  Other stroke risk factors include advanced age and prior history of TIA 2010.  Residual deficits of left hemiparesis and discharged to CIR on 11/01/2019 for ongoing therapy needs.  During CIR admission, difficulty managing INR levels therefore transition to Eliquis which was recommended for total duration of 3 months for right IJ acute DVT.  Also noted questionable seizure, possibly related to antibiotics.  She continued on both Cipro and Fortaz through 12/06/2019.  Also noted to have steady progression of L3-4 discitis/osteomyelitis with further imaging showing destruction of L3 and L4 vertebral bodies evaluated by neurosurgery without recommended interventions.  She was discharged on 12/05/2019 home with recommendation of home health therapies.  [name] is a 69 year old female who is being seen today for hospital follow-up after prolonged hospitalization from 10/20/2019 -12/05/2019.  Residual stroke deficits of left-sided weakness and recently transition to outpatient physical therapy.  She has made great improvement in regards to LUE but continues to have weakness LLE and difficulty with recovery due to left-sided sciatica.  She is nonambulatory at this time and transfers via wheelchair.  Continues on Eliquis without bleeding or bruising for acute DVT and due to lack of insurance coverage, will be transitioning to warfarin for additional 4 to 6 weeks for total 53-month duration.  INR levels planned to be  monitored by PCP.  Blood pressure today 154/96.  Continues to be followed by physical medicine and rehab Dr. Ranell Patrick for rehab needs and management of  left-sided sciatica felt secondary to neurogenic claudication with impingement of L5 nerve root.  Continues on gabapentin 300 mg twice daily prescribed by physical medicine and rehab.  Complains of left eye blurred vision and dizziness with horizontal and vertical movements.  She has been referred to ophthalmology but has not scheduled appointment at this time.  She does have appointment scheduled tomorrow with ID for hospital follow-up.  No further concerns at this time.    ROS:   14 system review of systems performed and negative with exception of pain, weakness, decreased vision  PMH:  Past Medical History:  Diagnosis Date  . Arthritis    hands  . Constipation   . Diabetes mellitus without complication (HCC)    Type II  . ESRD (end stage renal disease) ([place])     M/W/F- Hemodialysis  . GERD (gastroesophageal reflux disease)   . Headache   . Hyperlipidemia   . Hypertension   . Irregular heart rate   . Stroke [facility])    TIA - approx 2010- no residual    PSH:  Past Surgical History:  Procedure Laterality Date  . ABDOMINAL HYSTERECTOMY    . AORTIC ARCH ANGIOGRAPHY N/A 03/28/2019   Procedure: AORTIC ARCH ANGIOGRAPHY;  Surgeon: Waynetta Sandy, MD;  Location: [place] [facility];  Service: Cardiovascular;  Laterality: N/A;  . AV FISTULA PLACEMENT Left 06/13/2018   Procedure: ARTERIOVENOUS (AV) FISTULA CREATION;  Surgeon: Rosetta Posner, MD;  Location: [place];  Service: Vascular;  Laterality: Left;  . AV FISTULA PLACEMENT Left 08/18/2018   Procedure: INSERTION OF 4-7MM X 45CM ARTERIOVENOUS (AV) GORE-TEX GRAFT LEFT  FOREARM;  Surgeon: Rosetta Posner, MD;  Location: [place];  Service: Vascular;  Laterality: Left;  . AV FISTULA PLACEMENT Right 04/06/2019   Procedure: INSERTION OF ARTERIOVENOUS (AV) GORE-TEX GRAFT RIGHT UPPER ARM;  Surgeon: Waynetta Sandy, MD;  Location: [place];  Service: Vascular;  Laterality: Right;  . AV FISTULA PLACEMENT Right 04/13/2019   Procedure: INSERTION  OF ARTERIOVENOUS (AV) BOVINE  ARTEGRAFT GRAFT RIGHT UPPER EXTREMITY;  Surgeon: Serafina Mitchell, MD;  Location: [place];  Service: Vascular;  Laterality: Right;  . [place] REMOVAL Right 05/16/2019   Procedure: REMOVAL OF ARTERIOVENOUS GORETEX GRAFT ([place]) RIGHT ARM;  Surgeon: Angelia Mould, MD;  Location: [place];  Service: Vascular;  Laterality: Right;  . BASCILIC VEIN TRANSPOSITION Right 03/07/2019   Procedure: First Stage Bascilic Vein Transposition Right Arm;  Surgeon: Serafina Mitchell, MD;  Location: [place];  Service: Vascular;  Laterality: Right;  . CATARACT EXTRACTION Right 2005  . INSERTION OF DIALYSIS CATHETER N/A 08/18/2018   Procedure: INSERTION OF DIALYSIS CATHETER;  Surgeon: Rosetta Posner, MD;  Location: [place];  Service: Vascular;  Laterality: N/A;  . INSERTION OF DIALYSIS CATHETER Right 07/25/2019   Procedure: INSERTION OF DIALYSIS CATHETER RIGHT INTERNAL JUGULAR (TUNNELED);  Surgeon: Virl Cagey, MD;  Location: [facility];  Service: General;  Laterality: Right;  . IR FLUORO GUIDE CV LINE LEFT  10/26/2019  . IR FLUORO GUIDE CV LINE RIGHT  12/06/2018  . IR PTA ADDL CENTRAL DIALYSIS SEG THRU DIALY CIRCUIT RIGHT Right 12/06/2018  . IR REMOVAL TUN CV CATH W/O FL  10/23/2019  . IR US GUIDE VASC ACCESS LEFT  10/26/2019  . TEE WITHOUT CARDIOVERSION N/A 10/26/2019   Procedure:  TRANSESOPHAGEAL ECHOCARDIOGRAM (TEE);  Surgeon: Lelon Perla, MD;  Location: [place];  Service: Cardiovascular;  Laterality: N/A;  . THROMBECTOMY AND REVISION OF ARTERIOVENTOUS (AV) GORETEX  GRAFT Left 09/29/2018   Procedure: INSERTION OF ARTERIOVENTOUS (AV) GORETEX  GRAFT ARM;  Surgeon: Waynetta Sandy, MD;  Location: [place];  Service: Vascular;  Laterality: Left;  . UPPER EXTREMITY ANGIOGRAPHY Right 03/28/2019   Procedure: UPPER EXTREMITY ANGIOGRAPHY;  Surgeon: Waynetta Sandy, MD;  Location: [place] [facility];  Service: Cardiovascular;  Laterality: Right;  . VENOGRAM  10/27/2018   Procedure:  Venogram;  Surgeon: Marty Heck, MD;  Location: [place] [facility];  Service: Cardiovascular;;  bilateral arm    Social History:  Social History   Socioeconomic History  . Marital status: Single    Spouse name: Not on file  . Number of children: Not on file  . Years of education: Not on file  . Highest education level: Not on file  Occupational History  . Not on file  Tobacco Use  . Smoking status: Never Smoker  . Smokeless tobacco: Never Used  Substance and Sexual Activity  . Alcohol use: Never  . Drug use: Never  . Sexual activity: Not on file  Other Topics Concern  . Not on file  Social History Narrative  . Not on file   Social Determinants of Health   Financial Resource Strain:   . Difficulty of Paying Living Expenses: Not on file  Food Insecurity:   . Worried About Charity fundraiser in the Last Year: Not on file  . Ran Out of Food in the Last Year: Not on file  Transportation Needs:   . Lack of Transportation (Medical): Not on file  . Lack of Transportation (Non-Medical): Not on file  Physical Activity:   . Days of Exercise per Week: Not on file  . Minutes of Exercise per Session: Not on file  Stress:   . Feeling of Stress : Not on file  Social Connections:   . Frequency of Communication with Friends and Family: Not on file  . Frequency of Social Gatherings with Friends and Family: Not on file  . Attends Religious Services: Not on file  . Active Member of Clubs or Organizations: Not on file  . Attends Archivist Meetings: Not on file  . Marital Status: Not on file  Intimate Partner Violence:   . Fear of Current or Ex-Partner: Not on file  . Emotionally Abused: Not on file  . Physically Abused: Not on file  . Sexually Abused: Not on file    Family History:  Family History  Problem Relation Age of Onset  . Heart murmur Mother   . Heart failure Mother   . Diabetes Mother   . Cirrhosis Father   . Alcoholism Father     Medications:    Current Outpatient Medications on File Prior to Visit  Medication Sig Dispense Refill  . acetaminophen (TYLENOL) 325 MG tablet Take 2 tablets (650 mg total) by mouth every 12 (twelve) hours as needed for mild pain (or temp > 37.5 C (99.5 F)).    [place] Kitchen apixaban (ELIQUIS) 5 MG TABS tablet Take 1 tablet (5 mg total) by mouth 2 (two) times daily. 60 tablet 1  . calcium carbonate (TUMS - DOSED IN MG ELEMENTAL CALCIUM) 500 MG chewable tablet Chew 1 tablet (200 mg of elemental calcium total) by mouth 2 (two) times daily. With lunch & supper 60 tablet 1  . carvedilol (  COREG) 12.5 MG tablet Take 1 tablet (12.5 mg total) by mouth 2 (two) times daily. 60 tablet 1  . collagenase (SANTYL) ointment Apply topically daily. Apply to affected area as directed 15 g 0  . Darbepoetin Alfa (ARANESP) 100 MCG/0.5ML SOSY injection Inject 0.5 mLs (100 mcg total) into the vein every Monday with hemodialysis. 4.2 mL   . diclofenac Sodium (VOLTAREN) 1 % GEL Apply 2 g topically 4 (four) times daily. 2 g 1  . doxercalciferol (HECTOROL) 4 MCG/2ML injection Inject 0.75 mLs (1.5 mcg total) into the vein every Monday, Wednesday, and Friday with hemodialysis. 2 mL 0  . ferric gluconate 125 mg in sodium chloride 0.9 % 100 mL Inject 125 mg into the vein every Monday.    . fluticasone (FLONASE) 50 MCG/ACT nasal spray Place 1 spray into both nostrils daily as needed for allergies or rhinitis.     [place] Kitchen gabapentin (NEURONTIN) 300 MG capsule Take 1 capsule (300 mg total) by mouth 3 (three) times daily. 90 capsule 1  . isosorbide mononitrate (IMDUR) 60 MG 24 hr tablet Take 1 tablet (60 mg total) by mouth daily. 60 tablet 1  . lactulose (CHRONULAC) 10 GM/15ML solution Take 30 mLs (20 g total) by mouth 3 (three) times daily. 236 mL 0  . lidocaine (LIDODERM) 5 % Place 1 patch onto the skin daily. Remove & Discard patch within 12 hours or as directed by MD 30 patch 0  . meclizine (ANTIVERT) 25 MG tablet Take 1 tablet (25 mg total) by mouth every 8  (eight) hours as needed for dizziness. 30 tablet 0  . Melatonin 3 MG TABS Take 1 tablet (3 mg total) by mouth at bedtime. 30 tablet 0  . multivitamin (RENA-VIT) TABS tablet Take 1 tablet by mouth daily after lunch. 30 tablet 0  . ondansetron (ZOFRAN) 4 MG tablet Take 1 tablet (4 mg total) by mouth every 8 (eight) hours as needed for nausea or vomiting. 20 tablet 0  . oxyCODONE (OXY IR/ROXICODONE) 5 MG immediate release tablet Take 1 tablet (5 mg total) by mouth every 6 (six) hours as needed for moderate pain. 20 tablet 0  . pantoprazole (PROTONIX) 40 MG tablet Take 1 tablet (40 mg total) by mouth daily. 30 tablet 0  . saccharomyces boulardii (FLORASTOR) 250 MG capsule Take 1 capsule (250 mg total) by mouth 2 (two) times daily. 60 capsule 0   No current facility-administered medications on file prior to visit.    Allergies:  No Known Allergies   Physical Exam  Vitals:   01/23/20 1327  BP: (!) 154/96  Pulse: (!) 101  Temp: 97.6 F (36.4 C)  TempSrc: Oral  Weight: 145 lb 9.6 oz (66 kg)  Height: 5\' 7"  (1.702 m)   Body mass index is 22.8 kg/m. No exam data present  Depression screen [facility] 2/9 01/23/2020  Decreased Interest 0  Down, Depressed, Hopeless 0  PHQ - 2 Score 0     General: Frail pleasant elderly African-American female, seated, with frequent grimacing due to back pain Head: head normocephalic and atraumatic.   Neck: supple with no carotid or supraclavicular bruits Cardiovascular: regular rate and rhythm, no murmurs Musculoskeletal: no deformity; back brace in place Skin:  no rash/petichiae Vascular:  Normal pulses all extremities   Neurologic Exam Mental Status: Awake and fully alert.   Normal speech and language.  Oriented to place and time. Recent and remote memory intact. Attention span, concentration and fund of knowledge appropriate. Mood and affect appropriate.  Cranial Nerves: Fundoscopic exam reveals sharp disc margins. Pupils equal, briskly reactive to light.  Extraocular movements full without nystagmus.  Abnormal left eye tracking with subjective dizziness/blurriness looking towards the left and up. Visual fields full to confrontation. Hearing intact. Facial sensation intact.  Mild left lower facial weakness. Motor: Normal bulk and tone.  LUE: 4+/5 with limited ROM deltoid LLE: 3/5 assessment limited due to back pain RUE: 5/5 RLE: 4/5 hip flexion Sensory.: intact to touch , pinprick , position and vibratory sensation.  Coordination: Rapid alternating movements normal in all extremities except slightly decreased left hand. Finger-to-nose performed accurately bilaterally and heel-to-shin performed accurately on right side but unable to do Homans' sign due to back pain. Gait and Station: Deferred as patient nonambulatory Reflexes: 1+ and symmetric. Toes downgoing.     NIHSS  4 Modified Rankin  3-4    Diagnostic Data (Labs, Imaging, Testing)  Dg Chest 1 View 10/20/2019 IMPRESSION:  Possible trace left pleural effusion.   Ct Head Wo Contrast 10/20/2019 IMPRESSION:  1. Acute right basal ganglia infarcts.  2. Mild chronic small vessel ischemic disease.   10/27/2019 IMPRESSION: 1. Evidence of a new small left frontal lobe white matter infarct since the MRI on 10/20/2019, near the left frontal horn. No associated hemorrhage or mass effect. 2. Expected appearance of the recent right basal ganglia and right corona radiata infarcts.  Mr Brain 21 Contrast Mr Angio Head Wo Contrast 10/20/2019 IMPRESSION:  1. Acute right basal ganglia infarcts.  2. Chronic ischemia with multiple old infarcts as above.  3. Negative head MRA.   TTE 10/20/2019 Impression 1. Left ventricular ejection fraction, by visual estimation, is 60 to 65%. The left ventricle has normal function. There is mildly increased left ventricular hypertrophy. 2. Left ventricular diastolic parameters are consistent with Grade I diastolic dysfunction (impaired  relaxation). 3. Global right ventricle has normal systolic function.The right ventricular size is normal. 4. Right atrial size was normal. 5. Trivial pericardial effusion is present. 6. Mild mitral annular calcification. 7. The mitral valve is normal in structure. Trace mitral valve regurgitation. No evidence of mitral stenosis. 8. The tricuspid valve is normal in structure. Tricuspid valve regurgitation is mild. 9. The aortic valve is tricuspid. Aortic valve regurgitation is not visualized. Mild to moderate aortic valve sclerosis/calcification without any evidence of aortic stenosis. 10. The pulmonic valve was normal in structure. Pulmonic valve regurgitation is not visualized. 11. The inferior vena cava is normal in size with greater than 50% respiratory variability, suggesting right atrial pressure of 3 mmHg. 12. Normal LV systolic function; grade 1 diastolic dysfunction; mild LVH; mild LAE.  Bilateral Carotid Dopplers  Right Carotid: Velocities in the right ICA are consistent with a 1-39% stenosis. Left Carotid: Velocities in the left ICA are consistent with a 1-39% stenosis. Vertebrals: Bilateral vertebral arteries demonstrate antegrade flow. Subclavians: Normal flow hemodynamics were seen in bilateral subclavian arteries.  Bilateral Lower Extremity Venous Dopplers Right: There is no evidence of deep vein thrombosis in the lower extremity. Left: There is no evidence of deep vein thrombosis in the lower extremity. However, portions of this examination were limited- see technologist comments above.  Bilateral upper Extremity Venous Dopplers Right: No evidence of superficial vein thrombosis in the upper extremity. Findings consistent with acute deep vein thrombosis involving the right internal jugular vein. Findings consistent with age indeterminate deep vein thrombosis involving the one of the paired veins of the right brachial veins. Left:  No evidence of thrombosis in the  subclavian.  ECG - SR rate 83 BPM. (See cardiology reading for complete details)  Transcranial Dopplers 10/21/19 A vascular evaluation was performed. The right ICA siphon was studied. An IV was inserted into the patient's right forearm. Verbal informed consent was obtained. No obvious evidence of high intensity transient signals (HITS), therefore no evidence of clinically significant patent foramen ovale (PFO).  TEE Normal LV function;trace AI; large vegetation on anterior MV leaflet; moderate, eccentric, posterolaterally directed MR; mild TR.  MR LUMBAR SPINE WO CONTRAST 11/16/2019 IMPRESSION: Overall abnormal marrow signal is again likely due to renal osteodystrophy. However, progression of abnormal signal and endplate destruction at L3-L4 is suspicious for superimposed infection given new paraspinal edema and possible small collection extending laterally from the L3-L4 disc level into the left psoas muscle.      ASSESSMENT: [name] is a 69 y.o. year old female presented with dizziness, left arm weakness and left leg numbness on 10/20/2019 with stroke work-up revealing acute right BG, CR, and caudate scattered infarcts embolic pattern secondary to MV vegetation/endocarditis as evidenced on TEE. Vascular risk factors include acute IJ DVT, right brachial vein age indeterminate DVT, ESRD on HD, recurrent Pseudomonas bacteremia, MV endocarditis, HTN, and history of TIA in 2010.  Residual stroke deficits left hemiparesis with ongoing improvement    PLAN:  1. Embolic stroke: Continue Eliquis for acute IJ DVT for secondary stroke prevention. Maintain strict control of hypertension with blood pressure goal below 130/90, diabetes with hemoglobin A1c goal below 6.5% and cholesterol with LDL cholesterol (bad cholesterol) goal below 70 mg/dL.  I also advised the patient to eat a healthy diet with plenty of whole grains, cereals, fruits and vegetables, exercise regularly with at  least 30 minutes of continuous activity daily and maintain ideal body weight. 2. Left hemiparesis, poststroke: Advised continuation of outpatient therapies 3. HTN: Advised to continue current treatment regimen. Advised to continue to monitor at home along with continued follow-up with PCP for management 4. DMII: Advised to continue to monitor glucose levels at home along with continued follow-up with PCP for management and monitoring 5. Acute IJ DVT: Continuation of Eliquis for a total duration of 3 months 6. Left-sided sciatica: We will continue to follow with physical medicine and rehab for ongoing pain management.  Question possibility of neurosurgery reconsult if indicated.  Does have follow-up scheduled tomorrow with ID for further evaluation. 7. MV endocarditis/Pseudomonas bacteremia: Has follow-up scheduled with ID tomorrow    Follow up in 3 months or call earlier if needed   Greater than 50% of time during this 45 minute visit was spent on counseling, explanation of diagnosis of multiple embolic strokes, reviewing risk factor management of HTN, DM, acute DVT MV endocarditis/Pseudomonas bacteremia, planning of further management along with potential future management, and discussion with patient and family answering all questions.    Frann Rider, AGNP-BC  [facility] [address] [place], [place] [address]  Phone [phone] Fax [phone] Note: This document was prepared with digital dictation and possible smart phrase technology. Any transcriptional errors that result from this process are unintentional.

## 2020-01-23 NOTE — Progress Notes (Signed)
I agree with the above plan 

## 2020-01-23 NOTE — Patient Instructions (Signed)
Continue Eliquis (apixaban) daily  and lipitor  for secondary stroke prevention  Follow up with ID tomorrow for hospital follow up  Ensure an appointment with scheduled with an eye doctor for further evalation  Continue to follow up with PCP regarding cholesterol, blood pressure and diabetes management   Continue to monitor blood pressure at home  Maintain strict control of hypertension with blood pressure goal below 130/90, diabetes with hemoglobin A1c goal below 6.5% and cholesterol with LDL cholesterol (bad cholesterol) goal below 70 mg/dL. I also advised the patient to eat a healthy diet with plenty of whole grains, cereals, fruits and vegetables, exercise regularly and maintain ideal body weight.  Followup in the future with me in 3 months or call earlier if needed       Thank you for coming to see [name] at [facility]. I hope we have been able to provide you high quality care today.  You may receive a patient satisfaction survey over the next few weeks. We would appreciate your feedback and comments so that we may continue to improve ourselves and the health of our patients.

## 2020-01-24 ENCOUNTER — Other Ambulatory Visit: Payer: Self-pay | Admitting: Physical Medicine and Rehabilitation

## 2020-01-24 ENCOUNTER — Other Ambulatory Visit: Payer: Self-pay

## 2020-01-24 ENCOUNTER — Ambulatory Visit
Admission: RE | Admit: 2020-01-24 | Discharge: 2020-01-24 | Disposition: A | Discharge status: Home / Self Care | Payer: Medicare PPO | Source: Ambulatory Visit | Attending: Cardiothoracic Surgery | Admitting: Cardiothoracic Surgery

## 2020-01-24 ENCOUNTER — Encounter: Payer: Self-pay | Admitting: Cardiothoracic Surgery

## 2020-01-24 ENCOUNTER — Ambulatory Visit: Payer: Medicare PPO | Admitting: Cardiothoracic Surgery

## 2020-01-24 VITALS — BP 160/100 | HR 96 | Temp 97.7°F | Resp 20 | Ht 67.0 in | Wt 145.0 lb

## 2020-01-24 DIAGNOSIS — B999 Unspecified infectious disease: Secondary | ICD-10-CM

## 2020-01-24 DIAGNOSIS — I058 Other rheumatic mitral valve diseases: Secondary | ICD-10-CM | POA: Diagnosis not present

## 2020-01-24 DIAGNOSIS — I059 Rheumatic mitral valve disease, unspecified: Secondary | ICD-10-CM

## 2020-01-24 DIAGNOSIS — I340 Nonrheumatic mitral (valve) insufficiency: Secondary | ICD-10-CM | POA: Insufficient documentation

## 2020-01-24 DIAGNOSIS — I38 Endocarditis, valve unspecified: Secondary | ICD-10-CM | POA: Diagnosis not present

## 2020-01-24 DIAGNOSIS — N186 End stage renal disease: Secondary | ICD-10-CM | POA: Diagnosis not present

## 2020-01-24 DIAGNOSIS — I700 Atherosclerosis of aorta: Secondary | ICD-10-CM | POA: Diagnosis not present

## 2020-01-24 DIAGNOSIS — Z992 Dependence on renal dialysis: Secondary | ICD-10-CM | POA: Diagnosis not present

## 2020-01-24 NOTE — Progress Notes (Signed)
PCP is Sasser, Silvestre Moment, MD Referring Provider is Karie Kirks, DO  Chief Complaint  Patient presents with  . 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  She was given a 6-week course of IV antibiotics as recommended by ID.  She is receiving outpatient at home physical therapy but is not ambulatory and is in a wheelchair.  She has been followed up with neurosurgery but not recommended spine surgery because of her general improvement.    Patient discharged home on antiplatelet therapy for her strokes and has been followed by neurology.  She has also been taking Eliquis for left IJ DVT, diagnosed in the hospital, recommended for 3 months.  She takes carvedilol.  Her echo showed normal LV function and moderate TR.  She is in sinus rhythm.  RV function was normal.  No aortic valve disease.  TEE was performed by Dr. Stanford Breed   She denies symptoms of heart failure.  She has been slowly gaining weight and strength with her physical therapy.  No fevers.  She still has back pain and is nonambulatory.  She lives with her son who supports her.   Past Medical History:  Diagnosis Date  . Arthritis    hands  . Constipation   . Diabetes mellitus without complication (HCC)    Type II  . ESRD (end stage renal disease) ([place])     M/W/F- Hemodialysis  . GERD (gastroesophageal reflux disease)   . Headache   . Hyperlipidemia   . Hypertension   . Irregular heart rate   . Stroke [facility])    TIA - approx 2010- no residual     Past Surgical History:  Procedure Laterality Date  . ABDOMINAL HYSTERECTOMY    . AORTIC ARCH ANGIOGRAPHY N/A 03/28/2019   Procedure: AORTIC ARCH ANGIOGRAPHY;  Surgeon: Waynetta Sandy, MD;  Location: [place] [facility];  Service: Cardiovascular;  Laterality: N/A;  . AV FISTULA PLACEMENT Left 06/13/2018   Procedure: ARTERIOVENOUS (AV) FISTULA CREATION;  Surgeon: Rosetta Posner, MD;  Location: [place];  Service: Vascular;  Laterality: Left;  . AV FISTULA PLACEMENT Left 08/18/2018   Procedure: INSERTION OF 4-7MM X 45CM ARTERIOVENOUS (AV) GORE-TEX GRAFT LEFT  FOREARM;  Surgeon: Rosetta Posner, MD;  Location: [place];  Service: Vascular;  Laterality: Left;  . AV FISTULA PLACEMENT Right 04/06/2019   Procedure: INSERTION OF ARTERIOVENOUS (AV) GORE-TEX GRAFT RIGHT UPPER ARM;  Surgeon: Waynetta Sandy, MD;  Location: [place];  Service: Vascular;  Laterality: Right;  . AV FISTULA PLACEMENT Right 04/13/2019   Procedure: INSERTION OF ARTERIOVENOUS (AV) BOVINE  ARTEGRAFT GRAFT RIGHT UPPER EXTREMITY;  Surgeon: Serafina Mitchell, MD;  Location: [place];  Service: Vascular;  Laterality: Right;  . [place] REMOVAL Right 05/16/2019   Procedure: REMOVAL OF ARTERIOVENOUS GORETEX GRAFT ([place]) RIGHT ARM;  Surgeon: Angelia Mould, MD;  Location: [place];  Service: Vascular;  Laterality: Right;  . BASCILIC VEIN TRANSPOSITION Right 03/07/2019   Procedure: First Stage Bascilic Vein Transposition Right Arm;  Surgeon: Serafina Mitchell, MD;  Location: [place];  Service: Vascular;  Laterality: Right;  . CATARACT EXTRACTION Right 2005  . INSERTION OF DIALYSIS CATHETER N/A 08/18/2018   Procedure: INSERTION OF DIALYSIS CATHETER;  Surgeon: Rosetta Posner, MD;  Location: [place];  Service: Vascular;  Laterality: N/A;  . INSERTION OF DIALYSIS CATHETER Right 07/25/2019   Procedure: INSERTION OF DIALYSIS CATHETER RIGHT INTERNAL JUGULAR (TUNNELED);  Surgeon: Virl Cagey, MD;  Location: [facility];  Service: General;  Laterality:  Right;  . IR FLUORO GUIDE CV LINE LEFT  10/26/2019  . IR FLUORO GUIDE CV LINE RIGHT  12/06/2018  . IR PTA ADDL CENTRAL DIALYSIS SEG THRU DIALY CIRCUIT RIGHT Right 12/06/2018  . IR REMOVAL TUN CV CATH W/O FL  10/23/2019  . IR US GUIDE VASC ACCESS LEFT  10/26/2019  . TEE WITHOUT CARDIOVERSION N/A 10/26/2019   Procedure: TRANSESOPHAGEAL ECHOCARDIOGRAM (TEE);  Surgeon: Lelon Perla, MD;  Location: [place];  Service: Cardiovascular;  Laterality: N/A;  . THROMBECTOMY AND REVISION OF ARTERIOVENTOUS (AV) GORETEX  GRAFT Left 09/29/2018   Procedure: INSERTION OF ARTERIOVENTOUS (AV) GORETEX  GRAFT ARM;  Surgeon: Waynetta Sandy, MD;  Location: [place];  Service: Vascular;  Laterality: Left;  . UPPER EXTREMITY ANGIOGRAPHY Right 03/28/2019   Procedure: UPPER EXTREMITY ANGIOGRAPHY;  Surgeon: Waynetta Sandy, MD;  Location: [place] [facility];  Service: Cardiovascular;  Laterality: Right;  . VENOGRAM  10/27/2018   Procedure: Venogram;  Surgeon: Marty Heck, MD;  Location: [place] [facility];  Service: Cardiovascular;;  bilateral arm    Family History  Problem Relation Age of Onset  . Heart murmur Mother   . Heart failure Mother   . Diabetes Mother   . Cirrhosis Father   . Alcoholism Father     Social History Social History   Tobacco Use  . Smoking status: Never Smoker  . Smokeless tobacco: Never Used  Substance Use Topics  . Alcohol use: Never  . Drug use: Never    Current Outpatient Medications  Medication Sig Dispense Refill  . acetaminophen (TYLENOL) 325 MG tablet Take 2 tablets (650 mg total) by mouth every 12 (twelve) hours as needed for mild pain (or temp > 37.5 C (99.5 F)).    Marland Kitchen apixaban (ELIQUIS) 5 MG TABS tablet Take 1 tablet (5 mg total) by mouth 2 (two) times daily. 60 tablet 1  . calcium carbonate (TUMS - DOSED IN MG ELEMENTAL CALCIUM) 500 MG chewable tablet Chew 1 tablet (200 mg of elemental calcium total) by mouth 2 (two) times daily. With lunch &  supper 60 tablet 1  . carvedilol (COREG) 12.5 MG tablet Take 1 tablet (12.5 mg total) by mouth 2 (two) times daily. 60 tablet 1  . collagenase (SANTYL) ointment Apply topically daily. Apply to affected area as directed 15 g 0  . Darbepoetin Alfa (ARANESP) 100 MCG/0.5ML SOSY injection Inject 0.5 mLs (100 mcg total) into the vein every Monday with hemodialysis. 4.2 mL   . diclofenac Sodium (VOLTAREN) 1 % GEL Apply 2 g topically 4 (four) times daily. 2 g 1  . doxercalciferol (HECTOROL) 4 MCG/2ML injection Inject 0.75 mLs (1.5 mcg total) into the vein every Monday, Wednesday, and Friday with hemodialysis. 2 mL 0  . ferric gluconate 125 mg in sodium chloride 0.9 % 100 mL Inject 125 mg into the vein every Monday.    . fluticasone (FLONASE) 50 MCG/ACT nasal spray Place 1 spray into both nostrils daily as needed for allergies or rhinitis.     Marland Kitchen gabapentin (NEURONTIN)  300 MG capsule Take 1 capsule (300 mg total) by mouth 3 (three) times daily. 90 capsule 1  . isosorbide mononitrate (IMDUR) 60 MG 24 hr tablet Take 1 tablet (60 mg total) by mouth daily. 60 tablet 1  . lactulose (CHRONULAC) 10 GM/15ML solution Take 30 mLs (20 g total) by mouth 3 (three) times daily. 236 mL 0  . lidocaine (LIDODERM) 5 % Place 1 patch onto the skin daily. Remove & Discard patch within 12 hours or as directed by MD 30 patch 0  . meclizine (ANTIVERT) 25 MG tablet Take 1 tablet (25 mg total) by mouth every 8 (eight) hours as needed for dizziness. 30 tablet 0  . Melatonin 3 MG TABS TAKE ONE TABLET BY MOUTH AT BEDTIME. 30 tablet 0  . multivitamin (RENA-VIT) TABS tablet Take 1 tablet by mouth daily after lunch. 30 tablet 0  . ondansetron (ZOFRAN) 4 MG tablet Take 1 tablet (4 mg total) by mouth every 8 (eight) hours as needed for nausea or vomiting. 20 tablet 0  . oxyCODONE (OXY IR/ROXICODONE) 5 MG immediate release tablet Take 1 tablet (5 mg total) by mouth every 6 (six) hours as needed for moderate pain. 20 tablet 0  . pantoprazole  (PROTONIX) 40 MG tablet Take 1 tablet (40 mg total) by mouth daily. 30 tablet 0  . saccharomyces boulardii (FLORASTOR) 250 MG capsule Take 1 capsule (250 mg total) by mouth 2 (two) times daily. 60 capsule 0   No current facility-administered medications for this visit.    No Known Allergies  Review of Systems   Patient is slowly improving strength and gaining weight No symptoms of heart failure No fever No bleeding complications from Eliquis No falls Continued back pain and leg pain  BP (!) 160/100   Pulse 96   Temp 97.7 F (36.5 C) (Skin)   Resp 20   Ht 5\' 7"  (1.702 m)   Wt 145 lb (65.8 kg)   SpO2 97% Comment: RA  BMI 22.71 kg/m  Physical Exam      Exam    General-weak chronically ill fragile female in wheelchair no acute distress    Neck- no JVD, no cervical adenopathy palpable, no carotid bruit   Lungs- clear without rales, wheezes   Cor- regular rate and rhythm, 3/6 MR murmur to L axilla  no  gallop   Abdomen- soft, non-tender   Extremities - warm, non-tender, minimal edema   Neuro- oriented, appropriate, left leg weakness left eye gaze abnormality  Diagnostic Tests: Today's chest x-ray personally reviewed showing clear lung fields no pleural effusion Most recent echocardiogram performed personally reviewed   Impression: Post endocarditis MR, moderate with persistent murmur but appears asymptomatic.  Patient very poor candidate for mitral valve repair-replacement.  With her history of endocarditis doubt she would be a candidate for mitral clip.  She is taking only carvedilol.  I have recommended to the family that she be evaluated by cardiology for medical management of her MR and follow-up.  At some point in the future if her overall strength and health improves she may be a surgical candidate.  She has not been caths.    The patient and son agree with this recommendation.  Plan: Follow-up later this spring after the patient is seen by cardiology.  Len Childs, MD [facility] [phone]

## 2020-01-25 DIAGNOSIS — D631 Anemia in chronic kidney disease: Secondary | ICD-10-CM | POA: Diagnosis not present

## 2020-01-25 DIAGNOSIS — I69354 Hemiplegia and hemiparesis following cerebral infarction affecting left non-dominant side: Secondary | ICD-10-CM | POA: Diagnosis not present

## 2020-01-25 DIAGNOSIS — N186 End stage renal disease: Secondary | ICD-10-CM | POA: Diagnosis not present

## 2020-01-25 DIAGNOSIS — L89153 Pressure ulcer of sacral region, stage 3: Secondary | ICD-10-CM | POA: Diagnosis not present

## 2020-01-25 DIAGNOSIS — I120 Hypertensive chronic kidney disease with stage 5 chronic kidney disease or end stage renal disease: Secondary | ICD-10-CM | POA: Diagnosis not present

## 2020-01-25 DIAGNOSIS — E1122 Type 2 diabetes mellitus with diabetic chronic kidney disease: Secondary | ICD-10-CM | POA: Diagnosis not present

## 2020-01-26 DIAGNOSIS — N186 End stage renal disease: Secondary | ICD-10-CM | POA: Diagnosis not present

## 2020-01-26 DIAGNOSIS — Z992 Dependence on renal dialysis: Secondary | ICD-10-CM | POA: Diagnosis not present

## 2020-01-29 DIAGNOSIS — Z992 Dependence on renal dialysis: Secondary | ICD-10-CM | POA: Diagnosis not present

## 2020-01-29 DIAGNOSIS — N186 End stage renal disease: Secondary | ICD-10-CM | POA: Diagnosis not present

## 2020-01-30 DIAGNOSIS — N186 End stage renal disease: Secondary | ICD-10-CM | POA: Diagnosis not present

## 2020-01-30 DIAGNOSIS — I69354 Hemiplegia and hemiparesis following cerebral infarction affecting left non-dominant side: Secondary | ICD-10-CM | POA: Diagnosis not present

## 2020-01-30 DIAGNOSIS — L89153 Pressure ulcer of sacral region, stage 3: Secondary | ICD-10-CM | POA: Diagnosis not present

## 2020-01-30 DIAGNOSIS — E1122 Type 2 diabetes mellitus with diabetic chronic kidney disease: Secondary | ICD-10-CM | POA: Diagnosis not present

## 2020-01-30 DIAGNOSIS — M545 Low back pain: Secondary | ICD-10-CM | POA: Diagnosis not present

## 2020-01-30 DIAGNOSIS — D631 Anemia in chronic kidney disease: Secondary | ICD-10-CM | POA: Diagnosis not present

## 2020-01-30 DIAGNOSIS — I120 Hypertensive chronic kidney disease with stage 5 chronic kidney disease or end stage renal disease: Secondary | ICD-10-CM | POA: Diagnosis not present

## 2020-01-31 DIAGNOSIS — N186 End stage renal disease: Secondary | ICD-10-CM | POA: Diagnosis not present

## 2020-01-31 DIAGNOSIS — Z992 Dependence on renal dialysis: Secondary | ICD-10-CM | POA: Diagnosis not present

## 2020-02-01 ENCOUNTER — Encounter: Payer: Self-pay | Admitting: Cardiology

## 2020-02-01 ENCOUNTER — Other Ambulatory Visit: Payer: Self-pay

## 2020-02-01 ENCOUNTER — Ambulatory Visit: Payer: Medicare PPO | Admitting: Cardiology

## 2020-02-01 VITALS — BP 129/83 | HR 109 | Ht 67.0 in | Wt 145.0 lb

## 2020-02-01 DIAGNOSIS — E119 Type 2 diabetes mellitus without complications: Secondary | ICD-10-CM

## 2020-02-01 DIAGNOSIS — R0989 Other specified symptoms and signs involving the circulatory and respiratory systems: Secondary | ICD-10-CM | POA: Diagnosis not present

## 2020-02-01 DIAGNOSIS — Z992 Dependence on renal dialysis: Secondary | ICD-10-CM | POA: Diagnosis not present

## 2020-02-01 DIAGNOSIS — I340 Nonrheumatic mitral (valve) insufficiency: Secondary | ICD-10-CM | POA: Diagnosis not present

## 2020-02-01 DIAGNOSIS — Z8673 Personal history of transient ischemic attack (TIA), and cerebral infarction without residual deficits: Secondary | ICD-10-CM

## 2020-02-01 DIAGNOSIS — N186 End stage renal disease: Secondary | ICD-10-CM

## 2020-02-01 DIAGNOSIS — Z8679 Personal history of other diseases of the circulatory system: Secondary | ICD-10-CM | POA: Diagnosis not present

## 2020-02-01 NOTE — Patient Instructions (Signed)
Medication Instructions:  Your Physician recommend you continue on your current medication as directed.    *If you need a refill on your cardiac medications before your next appointment, please call your pharmacy*   Lab Work: None  Testing/Procedures: Your physician has requested that you have an echocardiogram. Echocardiography is a painless test that uses sound waves to create images of your heart. It provides your doctor with information about the size and shape of your heart and how well your heart's chambers and valves are working. This procedure takes approximately one hour. There are no restrictions for this procedure. [place] [address]    Follow-Up: At [facility], you and your health needs are our priority.  As part of our continuing mission to provide you with exceptional heart care, we have created designated Provider Care Teams.  These Care Teams include your primary Cardiologist (physician) and Advanced Practice Providers (APPs -  Physician Assistants and Nurse Practitioners) who all work together to provide you with the care you need, when you need it.  We recommend signing up for the patient portal called "MyChart".  Sign up information is provided on this After Visit Summary.  MyChart is used to connect with patients for Virtual Visits (Telemedicine).  Patients are able to view lab/test results, encounter notes, upcoming appointments, etc.  Non-urgent messages can be sent to your provider as well.   To learn more about what you can do with MyChart, go to [email or web address].    Your next appointment:   6 month(s)  The format for your next appointment:   In Person  Provider:   Buford Dresser, MD

## 2020-02-01 NOTE — Progress Notes (Signed)
Cardiology Office Note:    Date:  02/01/2020   ID:  [name], DOB [birth year], MRN [id number]  PCP:  Manon Hilding, MD  Cardiologist:  Buford Dresser, MD  Referring MD: Manon Hilding, MD   CC: new patient evaluation for mitral valve regurgitation  History of Present Illness:    [name] is a 69 y.o. 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  Note from Dr. Prescott Gum from 01/24/20 reviewed. Also reviewed prolonged hospitalization records from 10/2019-12/05/19 for recurrent pseudomonas bacteremia (initiallly 06/2019), mitral valve endocarditis, embolic stroke. She received 6 week course of IV antibiotics. Also noted to have left IJ DVT, recommended for three months of anticoagulation with apixaban (initially on coumadin due to ESRD, but labile INRs were a challenge, transitioned to DOAC). She is chronically nonambulatory and uses a wheelchair. Felt to be a very poor surgical candidate, recommended for medical management of her MR.  She is here with her son today. Continues to have hypotension with dialysis; had an episode yesterday that required her to stay for monitoring.   Breathing has been stable. Has had no issues with swelling. Still makes urine, has been urinating more. Main issue is residual bedsores and discitis pain.   We discussed the results of her workup during her prolonged hospitalization. Discussed what mitral regurgitation is, how endocarditis can damage the valve, and how we manage it. Both patient and son agree that surgery would be too difficult for her.   Noted to have moderate MR on TEE, moderate-severe on TTE. Discussed the symptoms that this can cause, what to watch for.  Denies chest pain, shortness of breath. No PND, orthopnea, LE  edema or unexpected weight gain. No syncope or palpitations.  Past Medical History:  Diagnosis Date  . Arthritis    hands  . Constipation   . Diabetes mellitus without complication (HCC)    Type II  . ESRD (end stage renal disease) ([place])     M/W/F- Hemodialysis  . GERD (gastroesophageal reflux disease)   . Headache   . Hyperlipidemia   . Hypertension   . Irregular heart rate   . Stroke [facility])    TIA - approx 2010- no residual    Past Surgical History:  Procedure Laterality Date  . ABDOMINAL HYSTERECTOMY    . AORTIC ARCH ANGIOGRAPHY N/A 03/28/2019   Procedure: AORTIC ARCH ANGIOGRAPHY;  Surgeon: Waynetta Sandy, MD;  Location: [place] [facility];  Service: Cardiovascular;  Laterality: N/A;  . AV FISTULA PLACEMENT Left 06/13/2018   Procedure: ARTERIOVENOUS (AV) FISTULA CREATION;  Surgeon: Rosetta Posner, MD;  Location: [place];  Service: Vascular;  Laterality: Left;  . AV FISTULA PLACEMENT Left 08/18/2018   Procedure: INSERTION OF 4-7MM X 45CM ARTERIOVENOUS (AV) GORE-TEX GRAFT LEFT  FOREARM;  Surgeon: Rosetta Posner, MD;  Location: [place];  Service: Vascular;  Laterality: Left;  . AV FISTULA PLACEMENT Right 04/06/2019   Procedure: INSERTION OF ARTERIOVENOUS (AV) GORE-TEX GRAFT RIGHT UPPER ARM;  Surgeon: Waynetta Sandy, MD;  Location: [place];  Service: Vascular;  Laterality: Right;  . AV FISTULA PLACEMENT Right 04/13/2019   Procedure: INSERTION OF ARTERIOVENOUS (AV) BOVINE  ARTEGRAFT GRAFT RIGHT UPPER EXTREMITY;  Surgeon: Serafina Mitchell, MD;  Location: [place];  Service: Vascular;  Laterality: Right;  . [place] REMOVAL Right 05/16/2019   Procedure: REMOVAL OF ARTERIOVENOUS GORETEX GRAFT ([place]) RIGHT ARM;  Surgeon: Angelia Mould, MD;  Location: [place];  Service: Vascular;  Laterality: Right;  . BASCILIC VEIN TRANSPOSITION Right 03/07/2019   Procedure: First Stage Bascilic Vein Transposition Right Arm;  Surgeon: Serafina Mitchell, MD;  Location: [place];  Service: Vascular;   Laterality: Right;  . CATARACT EXTRACTION Right 2005  . INSERTION OF DIALYSIS CATHETER N/A 08/18/2018   Procedure: INSERTION OF DIALYSIS CATHETER;  Surgeon: Rosetta Posner, MD;  Location: [place];  Service: Vascular;  Laterality: N/A;  . INSERTION OF DIALYSIS CATHETER Right 07/25/2019   Procedure: INSERTION OF DIALYSIS CATHETER RIGHT INTERNAL JUGULAR (TUNNELED);  Surgeon: Virl Cagey, MD;  Location: [facility];  Service: General;  Laterality: Right;  . IR FLUORO GUIDE CV LINE LEFT  10/26/2019  . IR FLUORO GUIDE CV LINE RIGHT  12/06/2018  . IR PTA ADDL CENTRAL DIALYSIS SEG THRU DIALY CIRCUIT RIGHT Right 12/06/2018  . IR REMOVAL TUN CV CATH W/O FL  10/23/2019  . IR US GUIDE VASC ACCESS LEFT  10/26/2019  . TEE WITHOUT CARDIOVERSION N/A 10/26/2019   Procedure: TRANSESOPHAGEAL ECHOCARDIOGRAM (TEE);  Surgeon: Lelon Perla, MD;  Location: [place];  Service: Cardiovascular;  Laterality: N/A;  . THROMBECTOMY AND REVISION OF ARTERIOVENTOUS (AV) GORETEX  GRAFT Left 09/29/2018   Procedure: INSERTION OF ARTERIOVENTOUS (AV) GORETEX  GRAFT ARM;  Surgeon: Waynetta Sandy, MD;  Location: [place];  Service: Vascular;  Laterality: Left;  . UPPER EXTREMITY ANGIOGRAPHY Right 03/28/2019   Procedure: UPPER EXTREMITY ANGIOGRAPHY;  Surgeon: Waynetta Sandy, MD;  Location: [place] [facility];  Service: Cardiovascular;  Laterality: Right;  . VENOGRAM  10/27/2018   Procedure: Venogram;  Surgeon: Marty Heck, MD;  Location: [place] [facility];  Service: Cardiovascular;;  bilateral arm    Current Medications: Current Outpatient Medications on File Prior to Visit  Medication Sig  . acetaminophen (TYLENOL) 325 MG tablet Take 2 tablets (650 mg total) by mouth every 12 (twelve) hours as needed for mild pain (or temp > 37.5 C (99.5 F)).  Marland Kitchen apixaban (ELIQUIS) 5 MG TABS tablet Take 1 tablet (5 mg total) by mouth 2 (two) times daily.  . calcium carbonate (TUMS - DOSED IN MG ELEMENTAL CALCIUM) 500 MG  chewable tablet Chew 1 tablet (200 mg of elemental calcium total) by mouth 2 (two) times daily. With lunch & supper  . carvedilol (COREG) 12.5 MG tablet Take 1 tablet (12.5 mg total) by mouth 2 (two) times daily.  . collagenase (SANTYL) ointment Apply topically daily. Apply to affected area as directed  . Darbepoetin Alfa (ARANESP) 100 MCG/0.5ML SOSY injection Inject 0.5 mLs (100 mcg total) into the vein every Monday with hemodialysis.  Marland Kitchen diclofenac Sodium (VOLTAREN) 1 % GEL Apply 2 g topically 4 (four) times daily.  Marland Kitchen doxercalciferol (HECTOROL) 4 MCG/2ML injection Inject 0.75 mLs (1.5 mcg total) into the vein every Monday, Wednesday, and Friday with hemodialysis.  . ferric gluconate 125 mg in sodium chloride 0.9 % 100 mL Inject 125 mg into the vein every Monday.  . fluticasone (FLONASE) 50 MCG/ACT nasal spray Place 1 spray into both nostrils daily as needed for allergies or rhinitis.   Marland Kitchen gabapentin (NEURONTIN) 300 MG capsule Take 1 capsule (300 mg total) by mouth 3 (three) times daily.  . isosorbide mononitrate (IMDUR) 60 MG 24 hr tablet Take 1 tablet (60 mg total) by mouth daily.  Marland Kitchen  lactulose (CHRONULAC) 10 GM/15ML solution Take 30 mLs (20 g total) by mouth 3 (three) times daily.  Marland Kitchen lidocaine (LIDODERM) 5 % Place 1 patch onto the skin daily. Remove & Discard patch within 12 hours or as directed by MD  . meclizine (ANTIVERT) 25 MG tablet Take 1 tablet (25 mg total) by mouth every 8 (eight) hours as needed for dizziness.  . Melatonin 3 MG TABS TAKE ONE TABLET BY MOUTH AT BEDTIME.  . multivitamin (RENA-VIT) TABS tablet Take 1 tablet by mouth daily after lunch.  . ondansetron (ZOFRAN) 4 MG tablet Take 1 tablet (4 mg total) by mouth every 8 (eight) hours as needed for nausea or vomiting.  Marland Kitchen oxyCODONE (OXY IR/ROXICODONE) 5 MG immediate release tablet Take 1 tablet (5 mg total) by mouth every 6 (six) hours as needed for moderate pain.  . pantoprazole (PROTONIX) 40 MG tablet Take 1 tablet (40 mg total) by  mouth daily.  Marland Kitchen saccharomyces boulardii (FLORASTOR) 250 MG capsule Take 1 capsule (250 mg total) by mouth 2 (two) times daily.   No current facility-administered medications on file prior to visit.     Allergies:   Patient has no known allergies.   Social History   Tobacco Use  . Smoking status: Never Smoker  . Smokeless tobacco: Never Used  Substance Use Topics  . Alcohol use: Never  . Drug use: Never    Family History: family history includes Alcoholism in her father; Cirrhosis in her father; Diabetes in her mother; Heart failure in her mother; Heart murmur in her mother.  ROS:   Please see the history of present illness.  Additional pertinent ROS: Constitutional: Negative for chills, fever, night sweats, unintentional weight loss  HENT: Negative for ear pain and hearing loss.   Eyes: Negative for loss of vision and eye pain.  Respiratory: Negative for cough, sputum, wheezing.   Cardiovascular: See HPI. Gastrointestinal: Negative for abdominal pain, melena, and hematochezia.  Genitourinary: Negative for dysuria and hematuria.  Musculoskeletal: Negative for falls and myalgias.  Skin: Negative for itching and rash.  Neurological: Negative for focal weakness, focal sensory changes and loss of consciousness.  Endo/Heme/Allergies: Does not bruise/bleed easily.     EKGs/Labs/Other Studies Reviewed:    The following studies were reviewed today: TTE 1228/20 1. Left ventricular ejection fraction, by visual estimation, is 55 to  60%. The left ventricle has normal function. Left ventricular septal wall  thickness was severely increased. Moderately increased left ventricular  posterior wall thickness.  2. Elevated left atrial and left ventricular end-diastolic pressures.  3. Left ventricular diastolic parameters are consistent with Grade I  diastolic dysfunction (impaired relaxation).  4. Moderate thickening of the anterior mitral valve leaflet(s).  5. Moderate vegetation on  the mitral valve.  6. The mitral valve is abnormal. Moderate to severe mitral valve  regurgitation.  7. Tricuspid valve regurgitation moderate.  8. Tricuspid valve regurgitation moderate.  9. Moderate thickening.  10. Normal pulmonary artery systolic pressure.  11. A prior study was performed on 10/26/2019.  12. Changes from prior study are noted.  13. Compared to the prior TEE, moderate AMVL endocarditis persists with a  mobile mass noted on the anterior leaflet ~0.5 cm in diameter, may be  vegetation or ruptured apparatus. There is moderate to severe, posteriorly  directed MR. Clinical correlation  with CHF symptoms is recommended.   TEE 10/26/19 1. Left ventricular ejection fraction, by visual estimation, is 55 to  60%. The left ventricle has normal function. There is  mildly increased  left ventricular hypertrophy.  2. Global right ventricle has normal systolic function.The right  ventricular size is normal.  3. Left atrial size was normal.  4. Right atrial size was normal.  5. Trivial pericardial effusion is present.  6. Large vegetation on the mitral valve.  7. The mitral valve is abnormal. Moderate mitral valve regurgitation.  8. The tricuspid valve is grossly normal. Tricuspid valve regurgitation  is mild.  9. The aortic valve is tricuspid. Aortic valve regurgitation is trivial.  Mild aortic valve sclerosis without stenosis.  10. The pulmonic valve was grossly normal. Pulmonic valve regurgitation is  mild.  11. Moderate plaque invoving the descending aorta.  12. Normal LV systolic function; large vegetation on anterior MV leaflet  with moderate, eccentric, posterolaterally directed MR; mild TR.   EKG:  EKG is personally reviewed.  The ekg ordered today demonstrates sinus tachycardia at 109 bpm, PRWP  Recent Labs: 07/19/2019: Magnesium 1.6; TSH 1.028 11/15/2019: ALT 57 12/04/2019: BUN 48; Creatinine, Ser 9.47; Hemoglobin 10.3; Platelets 240; Potassium 6.2;  Sodium 138  Recent Lipid Panel    Component Value Date/Time   CHOL 120 11/15/2019 0718   TRIG 177 (H) 11/15/2019 0718   HDL 28 (L) 11/15/2019 0718   CHOLHDL 4.3 11/15/2019 0718   VLDL 35 11/15/2019 0718   LDLCALC 57 11/15/2019 0718    Physical Exam:    VS:  BP 129/83   Pulse (!) 109   Ht 5\' 7"  (1.702 m)   Wt 145 lb (65.8 kg)   SpO2 96%   BMI 22.71 kg/m     Wt Readings from Last 3 Encounters:  02/01/20 145 lb (65.8 kg)  01/24/20 145 lb (65.8 kg)  01/23/20 145 lb 9.6 oz (66 kg)    GEN: frail, chronically ill appearing woman in no acute distress HEENT: Normal, moist mucous membranes NECK: No JVD CARDIAC: regular rhythm, normal S1 and S2, no rubs or gallops. 3/6 HSM loudest in posterior/axilla VASCULAR: Radial and DP pulses 2+ bilaterally. No carotid bruits RESPIRATORY:  Clear to auscultation without rales, wheezing or rhonchi  ABDOMEN: Soft, non-tender, non-distended MUSCULOSKELETAL: moves all 4 limbs independently, but non-ambulatory SKIN: Warm and dry, trace edema NEUROLOGIC:  Alert and oriented x 3.  PSYCHIATRIC:  Normal affect    ASSESSMENT:    1. Nonrheumatic mitral valve regurgitation   2. History of bacterial endocarditis   3. History of embolic stroke   4. ESRD (end stage renal disease) on dialysis ([place])   5. Labile blood pressure   6. Type 2 diabetes mellitus without obesity (HCC)    PLAN:    At least moderate, possibly severe mitral regurgitation, secondary to endocarditis of the anterior mitral valve: -has completed antibiotic treatment -not a surgical candidate given frailty/comorbidities -no active heart failure symptoms -given that she is asymptomatic and has limited blood pressure room for afterload reduction, will continue carvedilol and imdur for now. If hypotension becomes more persistent, may need to re-evaluate medications -volume management per HD -recheck echo in 6 mos, reassess clinical status and symptoms  Multiple embolic strokes,  presumed from mitral valve endocarditis: -follows with neurology  ESRD, on dialysis: -has intermittent hypotension with dialysis  Hypertension: labile blood pressure -as above, swings between hypertension and hypotension with dialysis -on carvedilol 12.5 mg BID -on imdur 60 mg daily -would recommend holding these medications the morning of dialysis, but will defer to her nephrology team  Type II diabetes: last A1c 7.6 -on no current diabetes meds  Right  IJ DVT: -on apixaban given labile INRs  Plan for follow up: 6 mos or sooner as needed  Buford Dresser, MD, PhD [place]  [facility]    Medication Adjustments/Labs and Tests Ordered: Current medicines are reviewed at length with the patient today.  Concerns regarding medicines are outlined above.  Orders Placed This Encounter  Procedures  . ECHOCARDIOGRAM COMPLETE   No orders of the defined types were placed in this encounter.   Patient Instructions  Medication Instructions:  Your Physician recommend you continue on your current medication as directed.    *If you need a refill on your cardiac medications before your next appointment, please call your pharmacy*   Lab Work: None  Testing/Procedures: Your physician has requested that you have an echocardiogram. Echocardiography is a painless test that uses sound waves to create images of your heart. It provides your doctor with information about the size and shape of your heart and how well your heart's chambers and valves are working. This procedure takes approximately one hour. There are no restrictions for this procedure. [place] [address]    Follow-Up: At [facility], you and your health needs are our priority.  As part of our continuing mission to provide you with exceptional heart care, we have created designated Provider Care Teams.  These Care Teams include your primary Cardiologist (physician) and Advanced Practice Providers (APPs -   Physician Assistants and Nurse Practitioners) who all work together to provide you with the care you need, when you need it.  We recommend signing up for the patient portal called "MyChart".  Sign up information is provided on this After Visit Summary.  MyChart is used to connect with patients for Virtual Visits (Telemedicine).  Patients are able to view lab/test results, encounter notes, upcoming appointments, etc.  Non-urgent messages can be sent to your provider as well.   To learn more about what you can do with MyChart, go to [email or web address].    Your next appointment:   6 month(s)  The format for your next appointment:   In Person  Provider:   Buford Dresser, MD       Signed, Buford Dresser, MD PhD 02/01/2020     [place]

## 2020-02-02 DIAGNOSIS — N186 End stage renal disease: Secondary | ICD-10-CM | POA: Diagnosis not present

## 2020-02-02 DIAGNOSIS — Z992 Dependence on renal dialysis: Secondary | ICD-10-CM | POA: Diagnosis not present

## 2020-02-03 DIAGNOSIS — I639 Cerebral infarction, unspecified: Secondary | ICD-10-CM | POA: Diagnosis not present

## 2020-02-03 DIAGNOSIS — G8194 Hemiplegia, unspecified affecting left nondominant side: Secondary | ICD-10-CM | POA: Diagnosis not present

## 2020-02-03 DIAGNOSIS — I120 Hypertensive chronic kidney disease with stage 5 chronic kidney disease or end stage renal disease: Secondary | ICD-10-CM | POA: Diagnosis not present

## 2020-02-03 DIAGNOSIS — N186 End stage renal disease: Secondary | ICD-10-CM | POA: Diagnosis not present

## 2020-02-03 DIAGNOSIS — I330 Acute and subacute infective endocarditis: Secondary | ICD-10-CM | POA: Diagnosis not present

## 2020-02-03 DIAGNOSIS — L89153 Pressure ulcer of sacral region, stage 3: Secondary | ICD-10-CM | POA: Diagnosis not present

## 2020-02-03 DIAGNOSIS — E1122 Type 2 diabetes mellitus with diabetic chronic kidney disease: Secondary | ICD-10-CM | POA: Diagnosis not present

## 2020-02-03 DIAGNOSIS — I69354 Hemiplegia and hemiparesis following cerebral infarction affecting left non-dominant side: Secondary | ICD-10-CM | POA: Diagnosis not present

## 2020-02-03 DIAGNOSIS — D631 Anemia in chronic kidney disease: Secondary | ICD-10-CM | POA: Diagnosis not present

## 2020-02-05 DIAGNOSIS — N186 End stage renal disease: Secondary | ICD-10-CM | POA: Diagnosis not present

## 2020-02-05 DIAGNOSIS — Z992 Dependence on renal dialysis: Secondary | ICD-10-CM | POA: Diagnosis not present

## 2020-02-06 DIAGNOSIS — D631 Anemia in chronic kidney disease: Secondary | ICD-10-CM | POA: Diagnosis not present

## 2020-02-06 DIAGNOSIS — I120 Hypertensive chronic kidney disease with stage 5 chronic kidney disease or end stage renal disease: Secondary | ICD-10-CM | POA: Diagnosis not present

## 2020-02-06 DIAGNOSIS — I69354 Hemiplegia and hemiparesis following cerebral infarction affecting left non-dominant side: Secondary | ICD-10-CM | POA: Diagnosis not present

## 2020-02-06 DIAGNOSIS — L89153 Pressure ulcer of sacral region, stage 3: Secondary | ICD-10-CM | POA: Diagnosis not present

## 2020-02-06 DIAGNOSIS — E1122 Type 2 diabetes mellitus with diabetic chronic kidney disease: Secondary | ICD-10-CM | POA: Diagnosis not present

## 2020-02-06 DIAGNOSIS — N186 End stage renal disease: Secondary | ICD-10-CM | POA: Diagnosis not present

## 2020-02-07 DIAGNOSIS — Z992 Dependence on renal dialysis: Secondary | ICD-10-CM | POA: Diagnosis not present

## 2020-02-07 DIAGNOSIS — N186 End stage renal disease: Secondary | ICD-10-CM | POA: Diagnosis not present

## 2020-02-08 DIAGNOSIS — M545 Low back pain: Secondary | ICD-10-CM | POA: Diagnosis not present

## 2020-02-12 DIAGNOSIS — E119 Type 2 diabetes mellitus without complications: Secondary | ICD-10-CM | POA: Diagnosis not present

## 2020-02-12 DIAGNOSIS — N186 End stage renal disease: Secondary | ICD-10-CM | POA: Diagnosis not present

## 2020-02-12 DIAGNOSIS — Z992 Dependence on renal dialysis: Secondary | ICD-10-CM | POA: Diagnosis not present

## 2020-02-12 DIAGNOSIS — Z794 Long term (current) use of insulin: Secondary | ICD-10-CM | POA: Diagnosis not present

## 2020-02-13 DIAGNOSIS — M545 Low back pain: Secondary | ICD-10-CM | POA: Diagnosis not present

## 2020-02-14 DIAGNOSIS — Z992 Dependence on renal dialysis: Secondary | ICD-10-CM | POA: Diagnosis not present

## 2020-02-14 DIAGNOSIS — N186 End stage renal disease: Secondary | ICD-10-CM | POA: Diagnosis not present

## 2020-02-16 DIAGNOSIS — N186 End stage renal disease: Secondary | ICD-10-CM | POA: Diagnosis not present

## 2020-02-16 DIAGNOSIS — Z992 Dependence on renal dialysis: Secondary | ICD-10-CM | POA: Diagnosis not present

## 2020-02-19 DIAGNOSIS — N186 End stage renal disease: Secondary | ICD-10-CM | POA: Diagnosis not present

## 2020-02-19 DIAGNOSIS — Z992 Dependence on renal dialysis: Secondary | ICD-10-CM | POA: Diagnosis not present

## 2020-02-20 DIAGNOSIS — M545 Low back pain: Secondary | ICD-10-CM | POA: Diagnosis not present

## 2020-02-21 DIAGNOSIS — Z992 Dependence on renal dialysis: Secondary | ICD-10-CM | POA: Diagnosis not present

## 2020-02-21 DIAGNOSIS — E7801 Familial hypercholesterolemia: Secondary | ICD-10-CM | POA: Diagnosis not present

## 2020-02-21 DIAGNOSIS — I10 Essential (primary) hypertension: Secondary | ICD-10-CM | POA: Diagnosis not present

## 2020-02-21 DIAGNOSIS — N186 End stage renal disease: Secondary | ICD-10-CM | POA: Diagnosis not present

## 2020-02-22 DIAGNOSIS — M545 Low back pain: Secondary | ICD-10-CM | POA: Diagnosis not present

## 2020-02-23 DIAGNOSIS — N186 End stage renal disease: Secondary | ICD-10-CM | POA: Diagnosis not present

## 2020-02-23 DIAGNOSIS — Z992 Dependence on renal dialysis: Secondary | ICD-10-CM | POA: Diagnosis not present

## 2020-02-26 DIAGNOSIS — N186 End stage renal disease: Secondary | ICD-10-CM | POA: Diagnosis not present

## 2020-02-26 DIAGNOSIS — Z992 Dependence on renal dialysis: Secondary | ICD-10-CM | POA: Diagnosis not present

## 2020-02-28 DIAGNOSIS — Z992 Dependence on renal dialysis: Secondary | ICD-10-CM | POA: Diagnosis not present

## 2020-02-28 DIAGNOSIS — N186 End stage renal disease: Secondary | ICD-10-CM | POA: Diagnosis not present

## 2020-03-01 DIAGNOSIS — Z992 Dependence on renal dialysis: Secondary | ICD-10-CM | POA: Diagnosis not present

## 2020-03-01 DIAGNOSIS — N186 End stage renal disease: Secondary | ICD-10-CM | POA: Diagnosis not present

## 2020-03-04 DIAGNOSIS — Z992 Dependence on renal dialysis: Secondary | ICD-10-CM | POA: Diagnosis not present

## 2020-03-04 DIAGNOSIS — N186 End stage renal disease: Secondary | ICD-10-CM | POA: Diagnosis not present

## 2020-03-05 DIAGNOSIS — I639 Cerebral infarction, unspecified: Secondary | ICD-10-CM | POA: Diagnosis not present

## 2020-03-05 DIAGNOSIS — M545 Low back pain: Secondary | ICD-10-CM | POA: Diagnosis not present

## 2020-03-05 DIAGNOSIS — N186 End stage renal disease: Secondary | ICD-10-CM | POA: Diagnosis not present

## 2020-03-05 DIAGNOSIS — I330 Acute and subacute infective endocarditis: Secondary | ICD-10-CM | POA: Diagnosis not present

## 2020-03-05 DIAGNOSIS — G8194 Hemiplegia, unspecified affecting left nondominant side: Secondary | ICD-10-CM | POA: Diagnosis not present

## 2020-03-06 DIAGNOSIS — Z992 Dependence on renal dialysis: Secondary | ICD-10-CM | POA: Diagnosis not present

## 2020-03-06 DIAGNOSIS — N186 End stage renal disease: Secondary | ICD-10-CM | POA: Diagnosis not present

## 2020-03-07 DIAGNOSIS — E1122 Type 2 diabetes mellitus with diabetic chronic kidney disease: Secondary | ICD-10-CM | POA: Diagnosis not present

## 2020-03-07 DIAGNOSIS — Z794 Long term (current) use of insulin: Secondary | ICD-10-CM | POA: Diagnosis not present

## 2020-03-07 DIAGNOSIS — S0990XA Unspecified injury of head, initial encounter: Secondary | ICD-10-CM | POA: Diagnosis not present

## 2020-03-07 DIAGNOSIS — T68XXXA Hypothermia, initial encounter: Secondary | ICD-10-CM | POA: Diagnosis not present

## 2020-03-07 DIAGNOSIS — R680 Hypothermia, not associated with low environmental temperature: Secondary | ICD-10-CM | POA: Diagnosis not present

## 2020-03-07 DIAGNOSIS — I2510 Atherosclerotic heart disease of native coronary artery without angina pectoris: Secondary | ICD-10-CM | POA: Diagnosis not present

## 2020-03-07 DIAGNOSIS — T383X5A Adverse effect of insulin and oral hypoglycemic [antidiabetic] drugs, initial encounter: Secondary | ICD-10-CM | POA: Diagnosis not present

## 2020-03-07 DIAGNOSIS — Z992 Dependence on renal dialysis: Secondary | ICD-10-CM | POA: Diagnosis not present

## 2020-03-07 DIAGNOSIS — T68XXXD Hypothermia, subsequent encounter: Secondary | ICD-10-CM | POA: Diagnosis not present

## 2020-03-07 DIAGNOSIS — R4020 Unspecified coma: Secondary | ICD-10-CM | POA: Diagnosis not present

## 2020-03-07 DIAGNOSIS — E162 Hypoglycemia, unspecified: Secondary | ICD-10-CM | POA: Diagnosis not present

## 2020-03-07 DIAGNOSIS — N186 End stage renal disease: Secondary | ICD-10-CM | POA: Diagnosis not present

## 2020-03-07 DIAGNOSIS — Z8673 Personal history of transient ischemic attack (TIA), and cerebral infarction without residual deficits: Secondary | ICD-10-CM | POA: Diagnosis not present

## 2020-03-07 DIAGNOSIS — E785 Hyperlipidemia, unspecified: Secondary | ICD-10-CM | POA: Diagnosis not present

## 2020-03-07 DIAGNOSIS — Z7901 Long term (current) use of anticoagulants: Secondary | ICD-10-CM | POA: Diagnosis not present

## 2020-03-07 DIAGNOSIS — I120 Hypertensive chronic kidney disease with stage 5 chronic kidney disease or end stage renal disease: Secondary | ICD-10-CM | POA: Diagnosis not present

## 2020-03-07 DIAGNOSIS — I10 Essential (primary) hypertension: Secondary | ICD-10-CM | POA: Diagnosis not present

## 2020-03-07 DIAGNOSIS — E11649 Type 2 diabetes mellitus with hypoglycemia without coma: Secondary | ICD-10-CM | POA: Diagnosis not present

## 2020-03-08 ENCOUNTER — Encounter: Payer: Medicare PPO | Attending: Physical Medicine and Rehabilitation | Admitting: Physical Medicine and Rehabilitation

## 2020-03-08 DIAGNOSIS — I639 Cerebral infarction, unspecified: Secondary | ICD-10-CM | POA: Insufficient documentation

## 2020-03-08 DIAGNOSIS — L89150 Pressure ulcer of sacral region, unstageable: Secondary | ICD-10-CM | POA: Insufficient documentation

## 2020-03-08 DIAGNOSIS — I8290 Acute embolism and thrombosis of unspecified vein: Secondary | ICD-10-CM | POA: Insufficient documentation

## 2020-03-08 DIAGNOSIS — Z5181 Encounter for therapeutic drug level monitoring: Secondary | ICD-10-CM | POA: Insufficient documentation

## 2020-03-08 DIAGNOSIS — N186 End stage renal disease: Secondary | ICD-10-CM | POA: Diagnosis not present

## 2020-03-08 DIAGNOSIS — R531 Weakness: Secondary | ICD-10-CM | POA: Insufficient documentation

## 2020-03-08 DIAGNOSIS — Z992 Dependence on renal dialysis: Secondary | ICD-10-CM | POA: Diagnosis not present

## 2020-03-08 DIAGNOSIS — H547 Unspecified visual loss: Secondary | ICD-10-CM | POA: Insufficient documentation

## 2020-03-08 DIAGNOSIS — Z79891 Long term (current) use of opiate analgesic: Secondary | ICD-10-CM | POA: Insufficient documentation

## 2020-03-11 DIAGNOSIS — N186 End stage renal disease: Secondary | ICD-10-CM | POA: Diagnosis not present

## 2020-03-11 DIAGNOSIS — Z992 Dependence on renal dialysis: Secondary | ICD-10-CM | POA: Diagnosis not present

## 2020-03-13 DIAGNOSIS — Z992 Dependence on renal dialysis: Secondary | ICD-10-CM | POA: Diagnosis not present

## 2020-03-13 DIAGNOSIS — N186 End stage renal disease: Secondary | ICD-10-CM | POA: Diagnosis not present

## 2020-03-14 ENCOUNTER — Other Ambulatory Visit: Payer: Self-pay | Admitting: Physical Medicine and Rehabilitation

## 2020-03-15 DIAGNOSIS — Z992 Dependence on renal dialysis: Secondary | ICD-10-CM | POA: Diagnosis not present

## 2020-03-15 DIAGNOSIS — N186 End stage renal disease: Secondary | ICD-10-CM | POA: Diagnosis not present

## 2020-03-18 DIAGNOSIS — N186 End stage renal disease: Secondary | ICD-10-CM | POA: Diagnosis not present

## 2020-03-18 DIAGNOSIS — Z992 Dependence on renal dialysis: Secondary | ICD-10-CM | POA: Diagnosis not present

## 2020-03-20 DIAGNOSIS — Z992 Dependence on renal dialysis: Secondary | ICD-10-CM | POA: Diagnosis not present

## 2020-03-20 DIAGNOSIS — N186 End stage renal disease: Secondary | ICD-10-CM | POA: Diagnosis not present

## 2020-03-21 ENCOUNTER — Encounter: Payer: Self-pay | Admitting: Cardiology

## 2020-03-21 DIAGNOSIS — N186 End stage renal disease: Secondary | ICD-10-CM | POA: Insufficient documentation

## 2020-03-21 DIAGNOSIS — Z8679 Personal history of other diseases of the circulatory system: Secondary | ICD-10-CM | POA: Insufficient documentation

## 2020-03-21 DIAGNOSIS — Z8673 Personal history of transient ischemic attack (TIA), and cerebral infarction without residual deficits: Secondary | ICD-10-CM | POA: Insufficient documentation

## 2020-03-21 NOTE — Addendum Note (Signed)
Addended by: Buford Dresser A on: 03/21/2020 04:40 PM   Modules accepted: Orders

## 2020-03-22 DIAGNOSIS — E7849 Other hyperlipidemia: Secondary | ICD-10-CM | POA: Diagnosis not present

## 2020-03-22 DIAGNOSIS — Z992 Dependence on renal dialysis: Secondary | ICD-10-CM | POA: Diagnosis not present

## 2020-03-22 DIAGNOSIS — I10 Essential (primary) hypertension: Secondary | ICD-10-CM | POA: Diagnosis not present

## 2020-03-22 DIAGNOSIS — N186 End stage renal disease: Secondary | ICD-10-CM | POA: Diagnosis not present

## 2020-03-25 DIAGNOSIS — Z992 Dependence on renal dialysis: Secondary | ICD-10-CM | POA: Diagnosis not present

## 2020-03-25 DIAGNOSIS — N186 End stage renal disease: Secondary | ICD-10-CM | POA: Diagnosis not present

## 2020-03-27 DIAGNOSIS — N186 End stage renal disease: Secondary | ICD-10-CM | POA: Diagnosis not present

## 2020-03-27 DIAGNOSIS — Z992 Dependence on renal dialysis: Secondary | ICD-10-CM | POA: Diagnosis not present

## 2020-03-29 DIAGNOSIS — Z992 Dependence on renal dialysis: Secondary | ICD-10-CM | POA: Diagnosis not present

## 2020-03-29 DIAGNOSIS — N186 End stage renal disease: Secondary | ICD-10-CM | POA: Diagnosis not present

## 2020-04-01 DIAGNOSIS — Z992 Dependence on renal dialysis: Secondary | ICD-10-CM | POA: Diagnosis not present

## 2020-04-01 DIAGNOSIS — N186 End stage renal disease: Secondary | ICD-10-CM | POA: Diagnosis not present

## 2020-04-03 DIAGNOSIS — Z992 Dependence on renal dialysis: Secondary | ICD-10-CM | POA: Diagnosis not present

## 2020-04-03 DIAGNOSIS — N186 End stage renal disease: Secondary | ICD-10-CM | POA: Diagnosis not present

## 2020-04-04 DIAGNOSIS — G8194 Hemiplegia, unspecified affecting left nondominant side: Secondary | ICD-10-CM | POA: Diagnosis not present

## 2020-04-04 DIAGNOSIS — N186 End stage renal disease: Secondary | ICD-10-CM | POA: Diagnosis not present

## 2020-04-04 DIAGNOSIS — I639 Cerebral infarction, unspecified: Secondary | ICD-10-CM | POA: Diagnosis not present

## 2020-04-04 DIAGNOSIS — I330 Acute and subacute infective endocarditis: Secondary | ICD-10-CM | POA: Diagnosis not present

## 2020-04-05 DIAGNOSIS — Z992 Dependence on renal dialysis: Secondary | ICD-10-CM | POA: Diagnosis not present

## 2020-04-05 DIAGNOSIS — N186 End stage renal disease: Secondary | ICD-10-CM | POA: Diagnosis not present

## 2020-04-08 DIAGNOSIS — N186 End stage renal disease: Secondary | ICD-10-CM | POA: Diagnosis not present

## 2020-04-08 DIAGNOSIS — Z992 Dependence on renal dialysis: Secondary | ICD-10-CM | POA: Diagnosis not present

## 2020-04-10 DIAGNOSIS — Z992 Dependence on renal dialysis: Secondary | ICD-10-CM | POA: Diagnosis not present

## 2020-04-10 DIAGNOSIS — N186 End stage renal disease: Secondary | ICD-10-CM | POA: Diagnosis not present

## 2020-04-12 DIAGNOSIS — N186 End stage renal disease: Secondary | ICD-10-CM | POA: Diagnosis not present

## 2020-04-12 DIAGNOSIS — Z992 Dependence on renal dialysis: Secondary | ICD-10-CM | POA: Diagnosis not present

## 2020-04-15 DIAGNOSIS — Z992 Dependence on renal dialysis: Secondary | ICD-10-CM | POA: Diagnosis not present

## 2020-04-15 DIAGNOSIS — N186 End stage renal disease: Secondary | ICD-10-CM | POA: Diagnosis not present

## 2020-04-17 DIAGNOSIS — N186 End stage renal disease: Secondary | ICD-10-CM | POA: Diagnosis not present

## 2020-04-17 DIAGNOSIS — Z992 Dependence on renal dialysis: Secondary | ICD-10-CM | POA: Diagnosis not present

## 2020-04-19 DIAGNOSIS — N186 End stage renal disease: Secondary | ICD-10-CM | POA: Diagnosis not present

## 2020-04-19 DIAGNOSIS — Z992 Dependence on renal dialysis: Secondary | ICD-10-CM | POA: Diagnosis not present

## 2020-04-21 ENCOUNTER — Emergency Department (HOSPITAL_COMMUNITY): Payer: Medicare PPO

## 2020-04-21 ENCOUNTER — Encounter (HOSPITAL_COMMUNITY): Payer: Self-pay

## 2020-04-21 ENCOUNTER — Inpatient Hospital Stay (HOSPITAL_COMMUNITY)
Admission: EM | Admit: 2020-04-21 | Discharge: 2020-04-26 | DRG: 640 | Disposition: A | Discharge status: Home / Self Care | Payer: Medicare PPO | Attending: Internal Medicine | Admitting: Internal Medicine

## 2020-04-21 ENCOUNTER — Other Ambulatory Visit: Payer: Self-pay

## 2020-04-21 DIAGNOSIS — Z992 Dependence on renal dialysis: Secondary | ICD-10-CM | POA: Diagnosis not present

## 2020-04-21 DIAGNOSIS — R4020 Unspecified coma: Secondary | ICD-10-CM | POA: Diagnosis not present

## 2020-04-21 DIAGNOSIS — N250 Renal osteodystrophy: Secondary | ICD-10-CM | POA: Diagnosis not present

## 2020-04-21 DIAGNOSIS — K219 Gastro-esophageal reflux disease without esophagitis: Secondary | ICD-10-CM | POA: Diagnosis present

## 2020-04-21 DIAGNOSIS — G92 Toxic encephalopathy: Secondary | ICD-10-CM | POA: Diagnosis not present

## 2020-04-21 DIAGNOSIS — E785 Hyperlipidemia, unspecified: Secondary | ICD-10-CM | POA: Diagnosis present

## 2020-04-21 DIAGNOSIS — Z811 Family history of alcohol abuse and dependence: Secondary | ICD-10-CM

## 2020-04-21 DIAGNOSIS — I69354 Hemiplegia and hemiparesis following cerebral infarction affecting left non-dominant side: Secondary | ICD-10-CM

## 2020-04-21 DIAGNOSIS — G8190 Hemiplegia, unspecified affecting unspecified side: Secondary | ICD-10-CM | POA: Diagnosis not present

## 2020-04-21 DIAGNOSIS — Z8679 Personal history of other diseases of the circulatory system: Secondary | ICD-10-CM

## 2020-04-21 DIAGNOSIS — G9341 Metabolic encephalopathy: Secondary | ICD-10-CM | POA: Diagnosis not present

## 2020-04-21 DIAGNOSIS — E1151 Type 2 diabetes mellitus with diabetic peripheral angiopathy without gangrene: Secondary | ICD-10-CM | POA: Diagnosis present

## 2020-04-21 DIAGNOSIS — E1122 Type 2 diabetes mellitus with diabetic chronic kidney disease: Secondary | ICD-10-CM | POA: Diagnosis present

## 2020-04-21 DIAGNOSIS — M19042 Primary osteoarthritis, left hand: Secondary | ICD-10-CM | POA: Diagnosis present

## 2020-04-21 DIAGNOSIS — E11649 Type 2 diabetes mellitus with hypoglycemia without coma: Secondary | ICD-10-CM | POA: Diagnosis present

## 2020-04-21 DIAGNOSIS — E1129 Type 2 diabetes mellitus with other diabetic kidney complication: Secondary | ICD-10-CM | POA: Diagnosis not present

## 2020-04-21 DIAGNOSIS — Z79891 Long term (current) use of opiate analgesic: Secondary | ICD-10-CM

## 2020-04-21 DIAGNOSIS — E160 Drug-induced hypoglycemia without coma: Secondary | ICD-10-CM | POA: Diagnosis not present

## 2020-04-21 DIAGNOSIS — G40A09 Absence epileptic syndrome, not intractable, without status epilepticus: Secondary | ICD-10-CM | POA: Diagnosis present

## 2020-04-21 DIAGNOSIS — R197 Diarrhea, unspecified: Secondary | ICD-10-CM | POA: Diagnosis not present

## 2020-04-21 DIAGNOSIS — Z833 Family history of diabetes mellitus: Secondary | ICD-10-CM

## 2020-04-21 DIAGNOSIS — R05 Cough: Secondary | ICD-10-CM | POA: Diagnosis not present

## 2020-04-21 DIAGNOSIS — R569 Unspecified convulsions: Secondary | ICD-10-CM | POA: Diagnosis not present

## 2020-04-21 DIAGNOSIS — R112 Nausea with vomiting, unspecified: Secondary | ICD-10-CM | POA: Diagnosis not present

## 2020-04-21 DIAGNOSIS — N186 End stage renal disease: Secondary | ICD-10-CM | POA: Diagnosis present

## 2020-04-21 DIAGNOSIS — Z7901 Long term (current) use of anticoagulants: Secondary | ICD-10-CM

## 2020-04-21 DIAGNOSIS — I82C11 Acute embolism and thrombosis of right internal jugular vein: Secondary | ICD-10-CM | POA: Diagnosis present

## 2020-04-21 DIAGNOSIS — R4182 Altered mental status, unspecified: Secondary | ICD-10-CM | POA: Diagnosis not present

## 2020-04-21 DIAGNOSIS — M19041 Primary osteoarthritis, right hand: Secondary | ICD-10-CM | POA: Diagnosis present

## 2020-04-21 DIAGNOSIS — G9340 Encephalopathy, unspecified: Secondary | ICD-10-CM | POA: Diagnosis not present

## 2020-04-21 DIAGNOSIS — Z86718 Personal history of other venous thrombosis and embolism: Secondary | ICD-10-CM | POA: Diagnosis not present

## 2020-04-21 DIAGNOSIS — E860 Dehydration: Secondary | ICD-10-CM | POA: Diagnosis present

## 2020-04-21 DIAGNOSIS — D631 Anemia in chronic kidney disease: Secondary | ICD-10-CM | POA: Diagnosis present

## 2020-04-21 DIAGNOSIS — Z79899 Other long term (current) drug therapy: Secondary | ICD-10-CM

## 2020-04-21 DIAGNOSIS — E162 Hypoglycemia, unspecified: Secondary | ICD-10-CM | POA: Diagnosis not present

## 2020-04-21 DIAGNOSIS — I120 Hypertensive chronic kidney disease with stage 5 chronic kidney disease or end stage renal disease: Secondary | ICD-10-CM | POA: Diagnosis present

## 2020-04-21 DIAGNOSIS — I69349 Monoplegia of lower limb following cerebral infarction affecting unspecified side: Secondary | ICD-10-CM

## 2020-04-21 DIAGNOSIS — I76 Septic arterial embolism: Secondary | ICD-10-CM | POA: Diagnosis present

## 2020-04-21 DIAGNOSIS — E875 Hyperkalemia: Secondary | ICD-10-CM

## 2020-04-21 DIAGNOSIS — T383X5A Adverse effect of insulin and oral hypoglycemic [antidiabetic] drugs, initial encounter: Secondary | ICD-10-CM | POA: Diagnosis not present

## 2020-04-21 DIAGNOSIS — E8889 Other specified metabolic disorders: Secondary | ICD-10-CM | POA: Diagnosis present

## 2020-04-21 DIAGNOSIS — Z90710 Acquired absence of both cervix and uterus: Secondary | ICD-10-CM

## 2020-04-21 DIAGNOSIS — G40209 Localization-related (focal) (partial) symptomatic epilepsy and epileptic syndromes with complex partial seizures, not intractable, without status epilepticus: Secondary | ICD-10-CM | POA: Diagnosis present

## 2020-04-21 DIAGNOSIS — A0839 Other viral enteritis: Secondary | ICD-10-CM | POA: Diagnosis present

## 2020-04-21 DIAGNOSIS — Z8249 Family history of ischemic heart disease and other diseases of the circulatory system: Secondary | ICD-10-CM

## 2020-04-21 DIAGNOSIS — N184 Chronic kidney disease, stage 4 (severe): Secondary | ICD-10-CM | POA: Diagnosis not present

## 2020-04-21 DIAGNOSIS — I059 Rheumatic mitral valve disease, unspecified: Secondary | ICD-10-CM | POA: Diagnosis present

## 2020-04-21 DIAGNOSIS — U071 COVID-19: Secondary | ICD-10-CM | POA: Diagnosis present

## 2020-04-21 DIAGNOSIS — I129 Hypertensive chronic kidney disease with stage 1 through stage 4 chronic kidney disease, or unspecified chronic kidney disease: Secondary | ICD-10-CM | POA: Diagnosis not present

## 2020-04-21 DIAGNOSIS — R000 Tachycardia, unspecified: Secondary | ICD-10-CM | POA: Diagnosis not present

## 2020-04-21 LAB — CBG MONITORING, ED: Glucose-Capillary: 210 mg/dL — ABNORMAL HIGH (ref 70–99)

## 2020-04-21 LAB — CBC WITH DIFFERENTIAL/PLATELET
Abs Immature Granulocytes: 0.02 10*3/uL (ref 0.00–0.07)
Basophils Absolute: 0 10*3/uL (ref 0.0–0.1)
Basophils Relative: 0 %
Eosinophils Absolute: 0 10*3/uL (ref 0.0–0.5)
Eosinophils Relative: 0 %
HCT: 43.5 % (ref 36.0–46.0)
Hemoglobin: 13.4 g/dL (ref 12.0–15.0)
Immature Granulocytes: 0 %
Lymphocytes Relative: 10 %
Lymphs Abs: 1 10*3/uL (ref 0.7–4.0)
MCH: 27.5 pg (ref 26.0–34.0)
MCHC: 30.8 g/dL (ref 30.0–36.0)
MCV: 89.3 fL (ref 80.0–100.0)
Monocytes Absolute: 0.3 10*3/uL (ref 0.1–1.0)
Monocytes Relative: 3 %
Neutro Abs: 8.6 10*3/uL — ABNORMAL HIGH (ref 1.7–7.7)
Neutrophils Relative %: 87 %
Platelets: 170 10*3/uL (ref 150–400)
RBC: 4.87 MIL/uL (ref 3.87–5.11)
RDW: 14.1 % (ref 11.5–15.5)
WBC: 9.9 10*3/uL (ref 4.0–10.5)
nRBC: 0 % (ref 0.0–0.2)

## 2020-04-21 LAB — COMPREHENSIVE METABOLIC PANEL
ALT: 18 U/L (ref 0–44)
AST: 20 U/L (ref 15–41)
Albumin: 4 g/dL (ref 3.5–5.0)
Alkaline Phosphatase: 130 U/L — ABNORMAL HIGH (ref 38–126)
Anion gap: 15 (ref 5–15)
BUN: 42 mg/dL — ABNORMAL HIGH (ref 8–23)
CO2: 25 mmol/L (ref 22–32)
Calcium: 9.5 mg/dL (ref 8.9–10.3)
Chloride: 99 mmol/L (ref 98–111)
Creatinine, Ser: 8.55 mg/dL — ABNORMAL HIGH (ref 0.44–1.00)
GFR calc Af Amer: 5 mL/min — ABNORMAL LOW (ref >60)
GFR calc non Af Amer: 4 mL/min — ABNORMAL LOW (ref >60)
Glucose, Bld: 204 mg/dL — ABNORMAL HIGH (ref 70–99)
Potassium: 7.5 mmol/L (ref 3.5–5.1)
Sodium: 139 mmol/L (ref 135–145)
Total Bilirubin: 0.9 mg/dL (ref 0.3–1.2)
Total Protein: 8.5 g/dL — ABNORMAL HIGH (ref 6.5–8.1)

## 2020-04-21 LAB — TROPONIN I (HIGH SENSITIVITY): Troponin I (High Sensitivity): 8 ng/L (ref <18)

## 2020-04-21 LAB — LIPASE, BLOOD: Lipase: 32 U/L (ref 11–51)

## 2020-04-21 LAB — LACTIC ACID, PLASMA: Lactic Acid, Venous: 1.7 mmol/L (ref 0.5–1.9)

## 2020-04-21 LAB — SARS CORONAVIRUS 2 BY RT PCR (HOSPITAL ORDER, PERFORMED IN ~~LOC~~ HOSPITAL LAB): SARS Coronavirus 2: POSITIVE — AB

## 2020-04-21 MED ORDER — SODIUM CHLORIDE 0.9 % IV SOLN
1.0000 g | Freq: Once | INTRAVENOUS | Status: AC
  Administered 2020-04-21: 1 g via INTRAVENOUS
  Filled 2020-04-21: qty 10

## 2020-04-21 MED ORDER — SODIUM CHLORIDE 0.9 % IV BOLUS
500.0000 mL | Freq: Once | INTRAVENOUS | Status: AC
  Administered 2020-04-21: 500 mL via INTRAVENOUS

## 2020-04-21 MED ORDER — INSULIN ASPART 100 UNIT/ML IV SOLN
5.0000 [IU] | Freq: Once | INTRAVENOUS | Status: AC
  Administered 2020-04-21: 5 [IU] via INTRAVENOUS

## 2020-04-21 MED ORDER — DEXTROSE 50 % IV SOLN
1.0000 | Freq: Once | INTRAVENOUS | Status: AC
  Administered 2020-04-21: 50 mL via INTRAVENOUS
  Filled 2020-04-21: qty 50

## 2020-04-21 MED ORDER — ONDANSETRON HCL 4 MG/2ML IJ SOLN
4.0000 mg | Freq: Once | INTRAMUSCULAR | Status: AC
  Administered 2020-04-21: 4 mg via INTRAVENOUS
  Filled 2020-04-21: qty 2

## 2020-04-21 MED ORDER — METOCLOPRAMIDE HCL 5 MG/ML IJ SOLN
10.0000 mg | Freq: Once | INTRAMUSCULAR | Status: AC
  Administered 2020-04-21: 10 mg via INTRAVENOUS
  Filled 2020-04-21: qty 2

## 2020-04-21 NOTE — ED Notes (Signed)
Date and time results received: 04/21/20 22:52 (use smartphrase ".now" to insert current time)  Test: covid Critical Value: positive  Name of Provider Notified: Evalee Jefferson PA  Orders Received? Or Actions Taken?: see emr

## 2020-04-21 NOTE — ED Notes (Signed)
Date and time results received: 04/21/20 23:23 (use smartphrase ".now" to insert current time)  Test: potassium  Critical Value: 7.5  Name of Provider Notified: Dr Stark Jock  Orders Received? Or Actions Taken?: see emr

## 2020-04-21 NOTE — ED Triage Notes (Signed)
Pt comes in for emesis "all day"

## 2020-04-21 NOTE — ED Provider Notes (Signed)
[facility] EMERGENCY DEPARTMENT Provider Note   CSN: [id number] Arrival date & time: 04/21/20  2017     History Chief Complaint  Patient presents with  . Emesis    [name] is a 69 y.o. 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  She denies abdominal pain, but has had significant weakness and feels very dehydrated.  She has been unable to tolerate any p.o. intake today.  She reports generalized weakness and fatigue.  She denies fevers or chills, no shortness of breath, denies chest pain.  She does make urine.  She has had no medications prior to arrival and has found no alleviators for her symptoms.    HPI     Past Medical History:  Diagnosis Date  . Arthritis    hands  . Constipation   . Diabetes mellitus without complication (HCC)    Type II  . ESRD (end stage renal disease) ([place])     M/W/F- Hemodialysis  . GERD (gastroesophageal reflux disease)   . Headache   . Hyperlipidemia   . Hypertension   . Irregular heart rate   . Stroke [facility])    TIA - approx 2010- no residual    Patient Active Problem List   Diagnosis Date Noted  . History of bacterial endocarditis 03/21/2020  . History of embolic stroke 98/33/8250  . ESRD (end stage renal disease) on dialysis ([place]) 03/21/2020  . Mitral valve regurgitation due to infection 01/24/2020  . Acute deep vein thrombosis (DVT) of non-extremity vein   . Greater trochanteric bursitis of left hip   . Subtherapeutic international normalized ratio (INR)   . Pneumatosis coli   . Ogilvie's syndrome   . Decubitus ulcer of sacral region, unstageable ([place])   . Hemodialysis-associated hypotension   . Supratherapeutic INR   . Gastroesophageal reflux disease   .  Encephalopathy, hepatic ([place])   . Hyperammonemia ([place])   . Sleep disturbance   . Candidiasis   . Endocarditis of mitral valve   . Septic embolism ([place])   . Seizure ([place])   . Slow transit constipation   . Acute on chronic anemia   . Labile blood pressure   . Labile blood glucose   . Diabetes mellitus type 2 in nonobese (HCC)   . Bacteremia   . Cerebrovascular accident (CVA) of right basal ganglia ([place]) 11/01/2019  . Acute bacterial endocarditis   . Acute left-sided weakness   . Acute ischemic stroke ([place]) 10/20/2019  . Hyperkalemia 09/10/2019  . End-stage renal disease on hemodialysis ([place])   . Nausea vomiting and diarrhea   . Bacteremia due to Pseudomonas 07/20/2019  . ESRD needing dialysis ([place]) 05/15/2019  . Anemia of chronic renal failure 04/03/2018  . Essential hypertension 04/03/2018  . Type 2 diabetes mellitus with diabetic nephropathy ([place]) 04/03/2018    Past Surgical History:  Procedure Laterality Date  . ABDOMINAL HYSTERECTOMY    . AORTIC ARCH ANGIOGRAPHY N/A 03/28/2019   Procedure: AORTIC ARCH ANGIOGRAPHY;  Surgeon: Waynetta Sandy, MD;  Location: [place] CV LAB;  Service: Cardiovascular;  Laterality: N/A;  . AV FISTULA PLACEMENT Left 06/13/2018   Procedure: ARTERIOVENOUS (AV) FISTULA CREATION;  Surgeon: Rosetta Posner, MD;  Location: [place];  Service: Vascular;  Laterality: Left;  .  AV FISTULA PLACEMENT Left 08/18/2018   Procedure: INSERTION OF 4-7MM X 45CM ARTERIOVENOUS (AV) GORE-TEX GRAFT LEFT  FOREARM;  Surgeon: Rosetta Posner, MD;  Location: [place];  Service: Vascular;  Laterality: Left;  . AV FISTULA PLACEMENT Right 04/06/2019   Procedure: INSERTION OF ARTERIOVENOUS (AV) GORE-TEX GRAFT RIGHT UPPER ARM;  Surgeon: Waynetta Sandy, MD;  Location: [place];  Service: Vascular;  Laterality: Right;  . AV FISTULA PLACEMENT Right 04/13/2019   Procedure: INSERTION OF ARTERIOVENOUS (AV) BOVINE  ARTEGRAFT GRAFT RIGHT UPPER EXTREMITY;  Surgeon: Serafina Mitchell,  MD;  Location: [place];  Service: Vascular;  Laterality: Right;  . [place] REMOVAL Right 05/16/2019   Procedure: REMOVAL OF ARTERIOVENOUS GORETEX GRAFT ([place]) RIGHT ARM;  Surgeon: Angelia Mould, MD;  Location: [place];  Service: Vascular;  Laterality: Right;  . BASCILIC VEIN TRANSPOSITION Right 03/07/2019   Procedure: First Stage Bascilic Vein Transposition Right Arm;  Surgeon: Serafina Mitchell, MD;  Location: [place];  Service: Vascular;  Laterality: Right;  . CATARACT EXTRACTION Right 2005  . INSERTION OF DIALYSIS CATHETER N/A 08/18/2018   Procedure: INSERTION OF DIALYSIS CATHETER;  Surgeon: Rosetta Posner, MD;  Location: [place];  Service: Vascular;  Laterality: N/A;  . INSERTION OF DIALYSIS CATHETER Right 07/25/2019   Procedure: INSERTION OF DIALYSIS CATHETER RIGHT INTERNAL JUGULAR (TUNNELED);  Surgeon: Virl Cagey, MD;  Location: [facility];  Service: General;  Laterality: Right;  . IR FLUORO GUIDE CV LINE LEFT  10/26/2019  . IR FLUORO GUIDE CV LINE RIGHT  12/06/2018  . IR PTA ADDL CENTRAL DIALYSIS SEG THRU DIALY CIRCUIT RIGHT Right 12/06/2018  . IR REMOVAL TUN CV CATH W/O FL  10/23/2019  . IR US GUIDE VASC ACCESS LEFT  10/26/2019  . TEE WITHOUT CARDIOVERSION N/A 10/26/2019   Procedure: TRANSESOPHAGEAL ECHOCARDIOGRAM (TEE);  Surgeon: Lelon Perla, MD;  Location: [place];  Service: Cardiovascular;  Laterality: N/A;  . THROMBECTOMY AND REVISION OF ARTERIOVENTOUS (AV) GORETEX  GRAFT Left 09/29/2018   Procedure: INSERTION OF ARTERIOVENTOUS (AV) GORETEX  GRAFT ARM;  Surgeon: Waynetta Sandy, MD;  Location: [place];  Service: Vascular;  Laterality: Left;  . UPPER EXTREMITY ANGIOGRAPHY Right 03/28/2019   Procedure: UPPER EXTREMITY ANGIOGRAPHY;  Surgeon: Waynetta Sandy, MD;  Location: [place] [facility];  Service: Cardiovascular;  Laterality: Right;  . VENOGRAM  10/27/2018   Procedure: Venogram;  Surgeon: Marty Heck, MD;  Location: [place] [facility];  Service:  Cardiovascular;;  bilateral arm     OB History    Gravida  1   Para  1   Term  1   Preterm      AB      Living        SAB      TAB      Ectopic      Multiple      Live Births              Family History  Problem Relation Age of Onset  . Heart murmur Mother   . Heart failure Mother   . Diabetes Mother   . Cirrhosis Father   . Alcoholism Father     Social History   Tobacco Use  . Smoking status: Never Smoker  . Smokeless tobacco: Never Used  Substance Use Topics  . Alcohol use: Never  . Drug use: Never    Home Medications Prior to Admission medications   Medication Sig Start Date End Date  Taking? Authorizing Provider  acetaminophen (TYLENOL) 325 MG tablet Take 2 tablets (650 mg total) by mouth every 12 (twelve) hours as needed for mild pain (or temp > 37.5 C (99.5 F)). 12/04/19   Angiulli, Lavon Paganini, PA-C  apixaban (ELIQUIS) 5 MG TABS tablet Take 1 tablet (5 mg total) by mouth 2 (two) times daily. 12/28/19   Raulkar, Clide Deutscher, MD  calcium carbonate (TUMS - DOSED IN MG ELEMENTAL CALCIUM) 500 MG chewable tablet Chew 1 tablet (200 mg of elemental calcium total) by mouth 2 (two) times daily. With lunch & supper 12/19/19   Raulkar, Clide Deutscher, MD  carvedilol (COREG) 12.5 MG tablet Take 1 tablet (12.5 mg total) by mouth 2 (two) times daily. 12/19/19   Raulkar, Clide Deutscher, MD  collagenase (SANTYL) ointment Apply topically daily. Apply to affected area as directed 12/05/19   Angiulli, Lavon Paganini, PA-C  Darbepoetin Alfa (ARANESP) 100 MCG/0.5ML SOSY injection Inject 0.5 mLs (100 mcg total) into the vein every Monday with hemodialysis. 12/04/19   Angiulli, Lavon Paganini, PA-C  diclofenac Sodium (VOLTAREN) 1 % GEL Apply 2 g topically 4 (four) times daily. 03/14/20   [name], MD  doxercalciferol (HECTOROL) 4 MCG/2ML injection Inject 0.75 mLs (1.5 mcg total) into the vein every Monday, Wednesday, and Friday with hemodialysis. 11/01/19   Swayze, Ava, DO  ferric gluconate 125 mg  in sodium chloride 0.9 % 100 mL Inject 125 mg into the vein every Monday. 12/04/19   Angiulli, Lavon Paganini, PA-C  fluticasone (FLONASE) 50 MCG/ACT nasal spray Place 1 spray into both nostrils daily as needed for allergies or rhinitis.     [provider]  gabapentin (NEURONTIN) 300 MG capsule Take 1 capsule (300 mg total) by mouth 3 (three) times daily. 12/19/19   Raulkar, Clide Deutscher, MD  isosorbide mononitrate (IMDUR) 60 MG 24 hr tablet Take 1 tablet (60 mg total) by mouth daily. 12/28/19   Raulkar, Clide Deutscher, MD  lactulose (CHRONULAC) 10 GM/15ML solution Take 30 mLs (20 g total) by mouth 3 (three) times daily. 12/05/19   Angiulli, Lavon Paganini, PA-C  lidocaine (LIDODERM) 5 % Place 1 patch onto the skin daily. Remove & Discard patch within 12 hours or as directed by MD 12/04/19   Angiulli, Lavon Paganini, PA-C  meclizine (ANTIVERT) 25 MG tablet Take 1 tablet (25 mg total) by mouth every 8 (eight) hours as needed for dizziness. 12/19/19   Raulkar, Clide Deutscher, MD  Melatonin 3 MG TABS TAKE ONE TABLET BY MOUTH AT BEDTIME. 01/24/20   Raulkar, Clide Deutscher, MD  multivitamin (RENA-VIT) TABS tablet Take 1 tablet by mouth daily after lunch. 12/04/19   Angiulli, Lavon Paganini, PA-C  ondansetron (ZOFRAN) 4 MG tablet Take 1 tablet (4 mg total) by mouth every 8 (eight) hours as needed for nausea or vomiting. 12/28/19   Raulkar, Clide Deutscher, MD  oxyCODONE (OXY IR/ROXICODONE) 5 MG immediate release tablet Take 1 tablet (5 mg total) by mouth every 6 (six) hours as needed for moderate pain. 12/19/19   Raulkar, Clide Deutscher, MD  pantoprazole (PROTONIX) 40 MG tablet Take 1 tablet (40 mg total) by mouth daily. 12/05/19   Angiulli, Lavon Paganini, PA-C  saccharomyces boulardii (FLORASTOR) 250 MG capsule Take 1 capsule (250 mg total) by mouth 2 (two) times daily. 12/04/19   Angiulli, Lavon Paganini, PA-C    Allergies    Patient has no known allergies.  Review of Systems   Review of Systems  Constitutional: Positive for fatigue. Negative for chills and  fever.   HENT: Negative for congestion.   Eyes: Negative.   Respiratory: Negative for chest tightness and shortness of breath.   Cardiovascular: Negative for chest pain.  Gastrointestinal: Positive for diarrhea, nausea and vomiting. Negative for abdominal pain.  Genitourinary: Negative.   Musculoskeletal: Negative for arthralgias, joint swelling and neck pain.  Skin: Negative.  Negative for rash and wound.  Neurological: Positive for weakness. Negative for dizziness, light-headedness, numbness and headaches.  Psychiatric/Behavioral: Negative.     Physical Exam Updated Vital Signs BP (!) 136/92   Pulse (!) 114   Temp 98.3 F (36.8 C) (Oral)   Resp 20   Ht 5\' 7"  (1.702 m)   Wt 65.8 kg   SpO2 99%   BMI 22.72 kg/m   Physical Exam Vitals and nursing note reviewed.  Constitutional:      General: She is in acute distress.     Appearance: She is well-developed. She is ill-appearing. She is not diaphoretic.     Comments: Actively dry heaving upon first presentation.  Appears uncomfortable.  HENT:     Head: Normocephalic and atraumatic.     Mouth/Throat:     Mouth: Mucous membranes are dry.  Eyes:     Conjunctiva/sclera: Conjunctivae normal.  Cardiovascular:     Rate and Rhythm: Regular rhythm. Tachycardia present.     Pulses: Normal pulses.     Heart sounds: Normal heart sounds.  Pulmonary:     Effort: Pulmonary effort is normal. No respiratory distress.     Breath sounds: Normal breath sounds. No wheezing.  Abdominal:     General: Bowel sounds are normal.     Palpations: Abdomen is soft. There is no mass.     Tenderness: There is no abdominal tenderness. There is no guarding.  Musculoskeletal:        General: Normal range of motion.     Cervical back: Normal range of motion.  Skin:    General: Skin is warm and dry.  Neurological:     Mental Status: She is alert and oriented to person, place, and time.     ED Results / Procedures / Treatments   Labs (all labs ordered are  listed, but only abnormal results are displayed) Labs Reviewed  SARS CORONAVIRUS 2 BY RT PCR ([place], [place] [facility]) - Abnormal; Notable for the following components:      Result Value   SARS Coronavirus 2 POSITIVE (*)    All other components within normal limits  CBC WITH DIFFERENTIAL/PLATELET - Abnormal; Notable for the following components:   Neutro Abs 8.6 (*)    All other components within normal limits  COMPREHENSIVE METABOLIC PANEL - Abnormal; Notable for the following components:   Potassium 7.5 (*)    Glucose, Bld 204 (*)    BUN 42 (*)    Creatinine, Ser 8.55 (*)    Total Protein 8.5 (*)    Alkaline Phosphatase 130 (*)    GFR calc non Af Amer 4 (*)    GFR calc Af Amer 5 (*)    All other components within normal limits  CBG MONITORING, ED - Abnormal; Notable for the following components:   Glucose-Capillary 210 (*)    All other components within normal limits  LIPASE, BLOOD  LACTIC ACID, PLASMA  URINALYSIS, ROUTINE W REFLEX MICROSCOPIC  POTASSIUM  TROPONIN I (HIGH SENSITIVITY)    EKG EKG Interpretation  Date/Time:  Sunday Apr 21 2020 22:14:53 EDT Ventricular Rate:  109 PR Interval:  QRS Duration: 99 QT Interval:  359 QTC Calculation: 484 R Axis:   89 Text Interpretation: Sinus tachycardia Borderline right axis deviation Confirmed by Veryl Speak [phone]) on 04/21/2020 10:21:12 PM   Radiology DG Chest Port 1 View  Result Date: 04/21/2020 CLINICAL DATA:  Cough, wheezing, diarrhea. EXAM: PORTABLE CHEST 1 VIEW COMPARISON:  01/24/2020 FINDINGS: Right-sided dialysis catheter in place. The heart is normal in size. Unchanged mediastinal contours. Aortic atherosclerosis. No pulmonary edema. No focal airspace disease. No pleural fluid or pneumothorax. No acute osseous abnormalities are seen. IMPRESSION: No acute chest findings. Aortic Atherosclerosis (ICD10-I70.0). Electronically Signed   By: Keith Rake M.D.   On: 04/21/2020 22:27     Procedures Procedures (including critical care time)  CRITICAL CARE Performed by: Evalee Jefferson Total critical care time: 40 minutes Critical care time was exclusive of separately billable procedures and treating other patients. Critical care was necessary to treat or prevent imminent or life-threatening deterioration. Critical care was time spent personally by me on the following activities: development of treatment plan with patient and/or surrogate as well as nursing, discussions with consultants, evaluation of patient's response to treatment, examination of patient, obtaining history from patient or surrogate, ordering and performing treatments and interventions, ordering and review of laboratory studies, ordering and review of radiographic studies, pulse oximetry and re-evaluation of patient's condition.   Medications Ordered in ED Medications  sodium chloride 0.9 % bolus 500 mL (500 mLs Intravenous New Bag/Given 04/21/20 2147)  ondansetron (ZOFRAN) injection 4 mg (4 mg Intravenous Given 04/21/20 2147)  metoCLOPramide (REGLAN) injection 10 mg (10 mg Intravenous Given 04/21/20 2231)  calcium chloride 1 g in sodium chloride 0.9 % 100 mL IVPB (1 g Intravenous New Bag/Given 04/21/20 2340)  insulin aspart (novoLOG) injection 5 Units (5 Units Intravenous Given 04/21/20 2344)    And  dextrose 50 % solution 50 mL (50 mLs Intravenous Given 04/21/20 2343)    ED Course  I have reviewed the triage vital signs and the nursing notes.  Pertinent labs & imaging results that were available during my care of the patient were reviewed by me and considered in my medical decision making (see chart for details).    MDM Rules/Calculators/A&P                      Patient does appear significantly dehydrated.  She was given a 500 cc bolus of IV fluids.  She was also given Zofran which did not alleviate her vomiting.  IV Reglan resolved the vomiting.  Labs significant for an extremely elevated potassium level at  7.5.  She does not have any acute EKG changes reflecting this hyper kalemia.  She was given IV calcium, insulin and dextrose.  Repeat potassium level ordered.  Her Covid test is positive.  Patient has received both Covid vaccines however.  She has no respiratory complaints, I suspect she has the the GI Covid variant.  Discussed patient with Dr. Moshe Cipro of nephrology who confirmed she will need to be transferred to [facility] for dialysis.  Discussed with Dr. Darrick Meigs of the [facility] hospitalist service who accepts patient for transfer admission. Final Clinical Impression(s) / ED Diagnoses Final diagnoses:  Hyperkalemia  Nausea vomiting and diarrhea  COVID-19    Rx / DC Orders ED Discharge Orders    None       Landis Martins 04/22/20 0008    Veryl Speak, MD 04/22/20 1505

## 2020-04-22 DIAGNOSIS — E860 Dehydration: Secondary | ICD-10-CM | POA: Diagnosis present

## 2020-04-22 DIAGNOSIS — N186 End stage renal disease: Secondary | ICD-10-CM | POA: Diagnosis not present

## 2020-04-22 DIAGNOSIS — I69354 Hemiplegia and hemiparesis following cerebral infarction affecting left non-dominant side: Secondary | ICD-10-CM | POA: Diagnosis not present

## 2020-04-22 DIAGNOSIS — I69349 Monoplegia of lower limb following cerebral infarction affecting unspecified side: Secondary | ICD-10-CM | POA: Diagnosis not present

## 2020-04-22 DIAGNOSIS — E11649 Type 2 diabetes mellitus with hypoglycemia without coma: Secondary | ICD-10-CM | POA: Diagnosis present

## 2020-04-22 DIAGNOSIS — R112 Nausea with vomiting, unspecified: Secondary | ICD-10-CM

## 2020-04-22 DIAGNOSIS — E160 Drug-induced hypoglycemia without coma: Secondary | ICD-10-CM | POA: Diagnosis not present

## 2020-04-22 DIAGNOSIS — R197 Diarrhea, unspecified: Secondary | ICD-10-CM | POA: Diagnosis not present

## 2020-04-22 DIAGNOSIS — E8889 Other specified metabolic disorders: Secondary | ICD-10-CM | POA: Diagnosis present

## 2020-04-22 DIAGNOSIS — M19042 Primary osteoarthritis, left hand: Secondary | ICD-10-CM | POA: Diagnosis present

## 2020-04-22 DIAGNOSIS — G92 Toxic encephalopathy: Secondary | ICD-10-CM | POA: Diagnosis not present

## 2020-04-22 DIAGNOSIS — M19041 Primary osteoarthritis, right hand: Secondary | ICD-10-CM | POA: Diagnosis present

## 2020-04-22 DIAGNOSIS — E875 Hyperkalemia: Secondary | ICD-10-CM | POA: Diagnosis present

## 2020-04-22 DIAGNOSIS — K219 Gastro-esophageal reflux disease without esophagitis: Secondary | ICD-10-CM | POA: Diagnosis present

## 2020-04-22 DIAGNOSIS — D631 Anemia in chronic kidney disease: Secondary | ICD-10-CM | POA: Diagnosis present

## 2020-04-22 DIAGNOSIS — R4182 Altered mental status, unspecified: Secondary | ICD-10-CM | POA: Diagnosis not present

## 2020-04-22 DIAGNOSIS — E785 Hyperlipidemia, unspecified: Secondary | ICD-10-CM | POA: Diagnosis present

## 2020-04-22 DIAGNOSIS — G40209 Localization-related (focal) (partial) symptomatic epilepsy and epileptic syndromes with complex partial seizures, not intractable, without status epilepticus: Secondary | ICD-10-CM | POA: Diagnosis present

## 2020-04-22 DIAGNOSIS — G40A09 Absence epileptic syndrome, not intractable, without status epilepticus: Secondary | ICD-10-CM | POA: Diagnosis present

## 2020-04-22 DIAGNOSIS — I120 Hypertensive chronic kidney disease with stage 5 chronic kidney disease or end stage renal disease: Secondary | ICD-10-CM | POA: Diagnosis present

## 2020-04-22 DIAGNOSIS — Z992 Dependence on renal dialysis: Secondary | ICD-10-CM | POA: Diagnosis not present

## 2020-04-22 DIAGNOSIS — E1122 Type 2 diabetes mellitus with diabetic chronic kidney disease: Secondary | ICD-10-CM | POA: Diagnosis present

## 2020-04-22 DIAGNOSIS — T383X5A Adverse effect of insulin and oral hypoglycemic [antidiabetic] drugs, initial encounter: Secondary | ICD-10-CM | POA: Diagnosis not present

## 2020-04-22 DIAGNOSIS — E1151 Type 2 diabetes mellitus with diabetic peripheral angiopathy without gangrene: Secondary | ICD-10-CM | POA: Diagnosis present

## 2020-04-22 DIAGNOSIS — R569 Unspecified convulsions: Secondary | ICD-10-CM | POA: Diagnosis not present

## 2020-04-22 DIAGNOSIS — G9341 Metabolic encephalopathy: Secondary | ICD-10-CM | POA: Diagnosis not present

## 2020-04-22 DIAGNOSIS — U071 COVID-19: Secondary | ICD-10-CM | POA: Diagnosis present

## 2020-04-22 DIAGNOSIS — I059 Rheumatic mitral valve disease, unspecified: Secondary | ICD-10-CM | POA: Diagnosis present

## 2020-04-22 DIAGNOSIS — I76 Septic arterial embolism: Secondary | ICD-10-CM | POA: Diagnosis present

## 2020-04-22 DIAGNOSIS — A0839 Other viral enteritis: Secondary | ICD-10-CM | POA: Diagnosis present

## 2020-04-22 DIAGNOSIS — I82C11 Acute embolism and thrombosis of right internal jugular vein: Secondary | ICD-10-CM | POA: Diagnosis present

## 2020-04-22 LAB — GLUCOSE, CAPILLARY
Glucose-Capillary: 101 mg/dL — ABNORMAL HIGH (ref 70–99)
Glucose-Capillary: 86 mg/dL (ref 70–99)
Glucose-Capillary: 119 mg/dL — ABNORMAL HIGH (ref 70–99)
Glucose-Capillary: 95 mg/dL (ref 70–99)

## 2020-04-22 LAB — POTASSIUM: Potassium: 5.9 mmol/L — ABNORMAL HIGH (ref 3.5–5.1)

## 2020-04-22 MED ORDER — INSULIN ASPART 100 UNIT/ML ~~LOC~~ SOLN
0.0000 [IU] | Freq: Three times a day (TID) | SUBCUTANEOUS | Status: DC
  Administered 2020-04-24: 5 [IU] via SUBCUTANEOUS

## 2020-04-22 MED ORDER — SODIUM CHLORIDE 0.9% FLUSH: 3.0000 mL | INTRAVENOUS | Status: DC | PRN

## 2020-04-22 MED ORDER — CARVEDILOL 12.5 MG PO TABS
12.5000 mg | ORAL_TABLET | Freq: Two times a day (BID) | ORAL | Status: DC
  Administered 2020-04-22 – 2020-04-24 (×5): 12.5 mg via ORAL
  Filled 2020-04-22 (×5): qty 1

## 2020-04-22 MED ORDER — ONDANSETRON HCL 4 MG PO TABS: 4.0000 mg | ORAL_TABLET | Freq: Four times a day (QID) | ORAL | Status: DC | PRN

## 2020-04-22 MED ORDER — ISOSORBIDE MONONITRATE ER 60 MG PO TB24
60.0000 mg | ORAL_TABLET | Freq: Every day | ORAL | Status: DC
  Administered 2020-04-22 – 2020-04-26 (×5): 60 mg via ORAL
  Filled 2020-04-22 (×5): qty 1

## 2020-04-22 MED ORDER — SODIUM CHLORIDE 0.9 % IV SOLN: 250.0000 mL | INTRAVENOUS | Status: DC | PRN

## 2020-04-22 MED ORDER — HEPARIN SODIUM (PORCINE) 1000 UNIT/ML IJ SOLN
INTRAMUSCULAR | Status: AC
  Administered 2020-04-22: 3200 [IU]
  Filled 2020-04-22: qty 4

## 2020-04-22 MED ORDER — PANTOPRAZOLE SODIUM 40 MG PO TBEC
40.0000 mg | DELAYED_RELEASE_TABLET | Freq: Every day | ORAL | Status: DC
  Administered 2020-04-22 – 2020-04-26 (×5): 40 mg via ORAL
  Filled 2020-04-22 (×5): qty 1

## 2020-04-22 MED ORDER — ACETAMINOPHEN 650 MG RE SUPP: 650.0000 mg | Freq: Four times a day (QID) | RECTAL | Status: DC | PRN

## 2020-04-22 MED ORDER — CHLORHEXIDINE GLUCONATE CLOTH 2 % EX PADS
6.0000 | MEDICATED_PAD | Freq: Every day | CUTANEOUS | Status: DC
  Administered 2020-04-22 – 2020-04-24 (×3): 6 via TOPICAL

## 2020-04-22 MED ORDER — SODIUM CHLORIDE 0.9% FLUSH: 3.0000 mL | Freq: Two times a day (BID) | INTRAVENOUS | Status: DC

## 2020-04-22 MED ORDER — METOCLOPRAMIDE HCL 5 MG/ML IJ SOLN: 10.0000 mg | Freq: Four times a day (QID) | INTRAMUSCULAR | Status: DC | PRN

## 2020-04-22 MED ORDER — ACETAMINOPHEN 325 MG PO TABS: 650.0000 mg | ORAL_TABLET | Freq: Four times a day (QID) | ORAL | Status: DC | PRN

## 2020-04-22 MED ORDER — APIXABAN 5 MG PO TABS
5.0000 mg | ORAL_TABLET | Freq: Two times a day (BID) | ORAL | Status: DC
  Administered 2020-04-22 – 2020-04-26 (×9): 5 mg via ORAL
  Filled 2020-04-22 (×9): qty 1

## 2020-04-22 MED ORDER — HYDRALAZINE HCL 20 MG/ML IJ SOLN
10.0000 mg | Freq: Four times a day (QID) | INTRAMUSCULAR | Status: DC | PRN
  Filled 2020-04-22: qty 1

## 2020-04-22 MED ORDER — ONDANSETRON HCL 4 MG/2ML IJ SOLN
4.0000 mg | Freq: Four times a day (QID) | INTRAMUSCULAR | Status: DC | PRN
  Administered 2020-04-24: 4 mg via INTRAVENOUS
  Filled 2020-04-22: qty 2

## 2020-04-22 MED ORDER — AMLODIPINE BESYLATE 10 MG PO TABS
10.0000 mg | ORAL_TABLET | Freq: Every day | ORAL | Status: DC
  Administered 2020-04-22 – 2020-04-24 (×3): 10 mg via ORAL
  Filled 2020-04-22 (×3): qty 1

## 2020-04-22 NOTE — ED Notes (Signed)
Pt sister Lars Pinks called and asked to pick up pts wheelchair today. She states that she will come today to pick it up.

## 2020-04-22 NOTE — Progress Notes (Signed)
PROGRESS NOTE                                                                                                                                                                                                             Patient Demographics:    [name], is a 69 y.o. female, DOB - Aug 22, 1951, ZOX:[id number]  Outpatient Primary MD for the patient is Sasser, Silvestre Moment, MD    LOS - 0  Admit date - 04/21/2020    Chief Complaint  Patient presents with  . Emesis       Brief Narrative - 69 y.o. 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   Subjective:    [name] today has, No headache, No chest pain, No abdominal pain - No Nausea, currently no diarrhea, no new weakness tingling or numbness, no Cough - SOB.     Assessment  & Plan :     1. Acute COVID-19 infection in a patient who has been vaccinated - she seems to have no pulmonary symptoms, chest x-ray clear, minimal nausea vomiting and diarrhea likely due to COVID-19 gastroenteritis which seems to have improved already.  Will monitor closely including her inflammatory markers.  Encouraged the patient to sit up in chair in the daytime use I-S and flutter valve for pulmonary toiletry and then prone in bed when at night.  Will advance activity and titrate down oxygen as possible.  SpO2: 94 %  Recent Labs  Lab 04/21/20 2136  SARSCOV2NAA POSITIVE*    Hepatic Function Latest Ref Rng & Units 04/21/2020 12/04/2019  12/01/2019  Total Protein 6.5 - 8.1 g/dL 8.5(H) - -  Albumin 3.5 - 5.0 g/dL 4.0 1.9(L) 2.0(L)  AST 15 - 41 U/L 20 - -  ALT 0 - 44 U/L 18 - -  Alk Phosphatase 38 - 126 U/L 130(H) - -  Total Bilirubin 0.3 - 1.2 mg/dL 0.9 - -  2.  Severe hyperkalemia in a patient with ESRD on MWF dialysis schedule with last dialysis 2 days prior to ER visit.  Potassium is better after hyperkalemia protocol, urgent dialysis today, since she is COVID-19 positive the outpatient facility likely will not take her back per son, will have social work help with procuring outpatient HD appointment.  Nephrology on board.  3.  Mild hospital-acquired delirium with some element of metabolic encephalopathy.  Supportive care.  Minimize narcotics and benzodiazepines, according to the son this happened last admission as well.  Supportive care and monitor.  4.  Essential hypertension.  Home medications continued Norvasc added for better control.  5.  History of mitral valve endocarditis with septic embolism.  Supportive care.    6.  History of right IJ DVT.  On Eliquis continue.    7. CVA with minimal left-sided weakness.  Supportive care.    8. DM. type II.  Will place on sliding scale and monitor.  Lab Results  Component Value Date   HGBA1C 7.6 (H) 10/21/2019   CBG (last 3)  Recent Labs    04/21/20 2219 04/22/20 0639  GLUCAP 210* 101*     Condition - Fair  Family Communication  :  Son [name] [phone] on 04/22/20  Code Status :  Full  Consults  :  Renal  Procedures  :  HD  PUD Prophylaxis : PPI  Disposition Plan  :    Status is: Inpatient  Remains inpatient appropriate because:Unsafe d/c plan   Dispo: The patient is from: Home              Anticipated d/c is to: Home              Anticipated d/c date is: 3 days              Patient currently is not medically stable to d/c.  SRD with severe hyperkalemia, now COVID-19 positive and outpatient dialysis facility will not take her back  currently.   DVT Prophylaxis  :  Heparin    Lab Results  Component Value Date   PLT 170 04/21/2020    Diet :  Diet Order            Diet NPO time specified  Diet effective now               Inpatient Medications  Scheduled Meds: . amLODipine  10 mg Oral Daily  . apixaban  5 mg Oral BID  . carvedilol  12.5 mg Oral BID  . Chlorhexidine Gluconate Cloth  6 each Topical Q0600  . heparin sodium (porcine)      . isosorbide mononitrate  60 mg Oral Daily  . pantoprazole  40 mg Oral Daily   Continuous Infusions: PRN Meds:.acetaminophen **OR** [DISCONTINUED] acetaminophen, hydrALAZINE, metoCLOPramide (REGLAN) injection, [DISCONTINUED] ondansetron **OR** ondansetron (ZOFRAN) IV  Antibiotics  :    Anti-infectives (From admission, onward)   None       Time Spent in minutes  30   Lala Lund M.D on 04/22/2020 at 9:02 AM  To page go to [email or web address] - password [facility]  [facility] -  Office  [phone]     See all Orders from today for further details    Objective:   Vitals:   04/22/20 0622 04/22/20 0642 04/22/20 0800 04/22/20 0830  BP: (!) 161/103  (!) 202/118 (P) 129/80  Pulse: (!) 112  (!) 107 (!) (P) 116  Resp: 18  20   Temp: 98.2 F (36.8  C)  98.6 F (37 C)   TempSrc: Oral  Oral   SpO2: 99%  94%   Weight:  62.7 kg 62.7 kg   Height:  _0  (1.702 m)      Wt Readings from Last 3 Encounters:  04/22/20 62.7 kg  02/01/20 65.8 kg  01/24/20 65.8 kg    No intake or output data in the 24 hours ending 04/22/20 0902   Physical Exam  Awake, pleasantly confused, minimal left-sided weakness, Normal affect [place].AT,PERRAL Supple Neck,No JVD, No cervical lymphadenopathy appriciated.  Symmetrical Chest wall movement, Good air movement bilaterally, CTAB RRR,No Gallops,Rubs or new Murmurs, No Parasternal Heave +ve B.Sounds, Abd Soft, No tenderness, No organomegaly appriciated, No rebound - guarding or rigidity. No Cyanosis, Clubbing or edema, No new Rash  or bruise    Data Review:    CBC Recent Labs  Lab 04/21/20 2155  WBC 9.9  HGB 13.4  HCT 43.5  PLT 170  MCV 89.3  MCH 27.5  MCHC 30.8  RDW 14.1  LYMPHSABS 1.0  MONOABS 0.3  EOSABS 0.0  BASOSABS 0.0    Chemistries  Recent Labs  Lab 04/21/20 2155 04/22/20 0014  NA 139  --   K 7.5* 5.9*  CL 99  --   CO2 25  --   GLUCOSE 204*  --   BUN 42*  --   CREATININE 8.55*  --   CALCIUM 9.5  --   AST 20  --   ALT 18  --   ALKPHOS 130*  --   BILITOT 0.9  --      ------------------------------------------------------------------------------------------------------------------ No results for input(s): CHOL, HDL, LDLCALC, TRIG, CHOLHDL, LDLDIRECT in the last 72 hours.  Lab Results  Component Value Date   HGBA1C 7.6 (H) 10/21/2019   ------------------------------------------------------------------------------------------------------------------ No results for input(s): TSH, T4TOTAL, T3FREE, THYROIDAB in the last 72 hours.  Invalid input(s): FREET3  Cardiac Enzymes No results for input(s): CKMB, TROPONINI, MYOGLOBIN in the last 168 hours.  Invalid input(s): CK ------------------------------------------------------------------------------------------------------------------ No results found for: BNP  Micro Results Recent Results (from the past 240 hour(s))  SARS Coronavirus 2 by RT PCR (hospital order, performed in [facility] hospital lab) Nasopharyngeal Nasopharyngeal Swab     Status: Abnormal   Collection Time: 04/21/20  9:36 PM   Specimen: Nasopharyngeal Swab  Result Value Ref Range Status   SARS Coronavirus 2 POSITIVE (A) NEGATIVE Final    Comment: RESULT CALLED TO, READ BACK BY AND VERIFIED WITH: POINTDEXTER,M AT 2249 ON 04/21/2020 BY MOSLEY,J (NOTE) SARS-CoV-2 target nucleic acids are DETECTED SARS-CoV-2 RNA is generally detectable in upper respiratory specimens  during the acute phase of infection.  Positive results are indicative  of the presence of the  identified virus, but do not rule out bacterial infection or co-infection with other pathogens not detected by the test.  Clinical correlation with patient history and  other diagnostic information is necessary to determine patient infection status.  The expected result is negative. Fact Sheet for Patients:   StrictlyIdeas.no  Fact Sheet for Healthcare Providers:   BankingDealers.co.za   This test is not yet approved or cleared by the [place] FDA and  has been authorized for detection and/or diagnosis of SARS-CoV-2 by FDA under an Emergency Use Authorization (EUA).  This EUA will remain in effect (meaning thi s test can be used) for the duration of  the COVID-19 declaration under Section 564(b)(1) of the Act, 21 U.S.C. section 360-bbb-3(b)(1), unless the authorization is terminated or revoked sooner. Performed  at [facility], [address] [place], [place] [address]     Radiology Reports DG Chest [place] 1 View  Result Date: 04/21/2020 CLINICAL DATA:  Cough, wheezing, diarrhea. EXAM: PORTABLE CHEST 1 VIEW COMPARISON:  01/24/2020 FINDINGS: Right-sided dialysis catheter in place. The heart is normal in size. Unchanged mediastinal contours. Aortic atherosclerosis. No pulmonary edema. No focal airspace disease. No pleural fluid or pneumothorax. No acute osseous abnormalities are seen. IMPRESSION: No acute chest findings. Aortic Atherosclerosis (ICD10-I70.0). Electronically Signed   By: Keith Rake M.D.   On: 04/21/2020 22:27

## 2020-04-22 NOTE — H&P (Signed)
TRH H&P    Patient Demographics:    [name], is a 69 y.o. female  MRN: [id number]  DOB - 26-Oct-1951  Admit Date - 04/21/2020  Referring MD/NP/PA: Evalee Jefferson  Outpatient Primary MD for the patient is Sasser, Silvestre Moment, MD  Patient coming from: Home  Chief complaint-vomiting, diarrhea   HPI:    [name]  is a 69 y.o. 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  She denies abdominal pain.  Patient says she was unable to tolerate anything by mouth today.  She denies fever or chills.  No shortness of breath.  No coughing up any phlegm.  Denies chest pain.  She does make urine. In the ED, lab work revealed significant hyperkalemia with potassium 7.5.  Nephrology was consulted and recommended patient to be transferred to [facility] for dialysis.  Patient received IV insulin, 1 amp of D50, calcium gluconate.  Repeat potassium has improved to 5.9.  SARS-CoV-2 RT-PCR came back positive. Patient is not requiring oxygen, chest x-ray is unremarkable.    Review of systems:    In addition to the HPI above,   All other systems reviewed and are negative.    Past History of the following :    Past Medical History:  Diagnosis Date  . Arthritis    hands  . Constipation   . Diabetes mellitus without complication (HCC)    Type II  . ESRD (end stage renal disease) ([place])     M/W/F- Hemodialysis  . GERD (gastroesophageal reflux disease)   . Headache   . Hyperlipidemia   . Hypertension   . Irregular heart rate   . Stroke [facility])    TIA - approx 2010- no residual      Past Surgical History:  Procedure  Laterality Date  . ABDOMINAL HYSTERECTOMY    . AORTIC ARCH ANGIOGRAPHY N/A 03/28/2019   Procedure: AORTIC ARCH ANGIOGRAPHY;  Surgeon: Waynetta Sandy, MD;  Location: [place] CV LAB;  Service: Cardiovascular;  Laterality: N/A;  . AV FISTULA PLACEMENT Left 06/13/2018   Procedure: ARTERIOVENOUS (AV) FISTULA CREATION;  Surgeon: Rosetta Posner, MD;  Location: [place];  Service: Vascular;  Laterality: Left;  . AV FISTULA PLACEMENT Left 08/18/2018   Procedure: INSERTION OF 4-7MM X 45CM ARTERIOVENOUS (AV) GORE-TEX GRAFT LEFT  FOREARM;  Surgeon: Rosetta Posner, MD;  Location: [place];  Service: Vascular;  Laterality: Left;  . AV FISTULA PLACEMENT Right 04/06/2019   Procedure: INSERTION OF ARTERIOVENOUS (AV) GORE-TEX GRAFT RIGHT UPPER ARM;  Surgeon: Waynetta Sandy, MD;  Location: [place];  Service: Vascular;  Laterality: Right;  . AV FISTULA PLACEMENT Right 04/13/2019   Procedure: INSERTION OF ARTERIOVENOUS (AV) BOVINE  ARTEGRAFT GRAFT RIGHT UPPER EXTREMITY;  Surgeon: Serafina Mitchell, MD;  Location: [facility]  OR;  Service: Vascular;  Laterality: Right;  . [place] REMOVAL Right 05/16/2019   Procedure: REMOVAL OF ARTERIOVENOUS GORETEX GRAFT ([place]) RIGHT ARM;  Surgeon: Angelia Mould, MD;  Location: [place];  Service: Vascular;  Laterality: Right;  . BASCILIC VEIN TRANSPOSITION Right 03/07/2019   Procedure: First Stage Bascilic Vein Transposition Right Arm;  Surgeon: Serafina Mitchell, MD;  Location: [place];  Service: Vascular;  Laterality: Right;  . CATARACT EXTRACTION Right 2005  . INSERTION OF DIALYSIS CATHETER N/A 08/18/2018   Procedure: INSERTION OF DIALYSIS CATHETER;  Surgeon: Rosetta Posner, MD;  Location: [place];  Service: Vascular;  Laterality: N/A;  . INSERTION OF DIALYSIS CATHETER Right 07/25/2019   Procedure: INSERTION OF DIALYSIS CATHETER RIGHT INTERNAL JUGULAR (TUNNELED);  Surgeon: Virl Cagey, MD;  Location: [facility];  Service: General;  Laterality: Right;  . IR FLUORO GUIDE CV LINE LEFT   10/26/2019  . IR FLUORO GUIDE CV LINE RIGHT  12/06/2018  . IR PTA ADDL CENTRAL DIALYSIS SEG THRU DIALY CIRCUIT RIGHT Right 12/06/2018  . IR REMOVAL TUN CV CATH W/O FL  10/23/2019  . IR US GUIDE VASC ACCESS LEFT  10/26/2019  . TEE WITHOUT CARDIOVERSION N/A 10/26/2019   Procedure: TRANSESOPHAGEAL ECHOCARDIOGRAM (TEE);  Surgeon: Lelon Perla, MD;  Location: [place];  Service: Cardiovascular;  Laterality: N/A;  . THROMBECTOMY AND REVISION OF ARTERIOVENTOUS (AV) GORETEX  GRAFT Left 09/29/2018   Procedure: INSERTION OF ARTERIOVENTOUS (AV) GORETEX  GRAFT ARM;  Surgeon: Waynetta Sandy, MD;  Location: [place];  Service: Vascular;  Laterality: Left;  . UPPER EXTREMITY ANGIOGRAPHY Right 03/28/2019   Procedure: UPPER EXTREMITY ANGIOGRAPHY;  Surgeon: Waynetta Sandy, MD;  Location: [place] CV LAB;  Service: Cardiovascular;  Laterality: Right;  . VENOGRAM  10/27/2018   Procedure: Venogram;  Surgeon: Marty Heck, MD;  Location: [place] CV LAB;  Service: Cardiovascular;;  bilateral arm      Social History:      Social History   Tobacco Use  . Smoking status: Never Smoker  . Smokeless tobacco: Never Used  Substance Use Topics  . Alcohol use: Never       Family History :     Family History  Problem Relation Age of Onset  . Heart murmur Mother   . Heart failure Mother   . Diabetes Mother   . Cirrhosis Father   . Alcoholism Father       Home Medications:   Prior to Admission medications   Medication Sig Start Date End Date Taking? Authorizing Provider  acetaminophen (TYLENOL) 325 MG tablet Take 2 tablets (650 mg total) by mouth every 12 (twelve) hours as needed for mild pain (or temp > 37.5 C (99.5 F)). 12/04/19   Angiulli, Lavon Paganini, PA-C  apixaban (ELIQUIS) 5 MG TABS tablet Take 1 tablet (5 mg total) by mouth 2 (two) times daily. 12/28/19   Raulkar, Clide Deutscher, MD  calcium carbonate (TUMS - DOSED IN MG ELEMENTAL CALCIUM) 500 MG chewable tablet Chew 1  tablet (200 mg of elemental calcium total) by mouth 2 (two) times daily. With lunch & supper 12/19/19   Raulkar, Clide Deutscher, MD  carvedilol (COREG) 12.5 MG tablet Take 1 tablet (12.5 mg total) by mouth 2 (two) times daily. 12/19/19   Raulkar, Clide Deutscher, MD  collagenase (SANTYL) ointment Apply topically daily. Apply to affected area as directed 12/05/19   Angiulli, Lavon Paganini, PA-C  Darbepoetin Alfa (ARANESP) 100 MCG/0.5ML SOSY injection Inject 0.5 mLs (100  mcg total) into the vein every Monday with hemodialysis. 12/04/19   Angiulli, Lavon Paganini, PA-C  diclofenac Sodium (VOLTAREN) 1 % GEL Apply 2 g topically 4 (four) times daily. 03/14/20   [name], MD  doxercalciferol (HECTOROL) 4 MCG/2ML injection Inject 0.75 mLs (1.5 mcg total) into the vein every Monday, Wednesday, and Friday with hemodialysis. 11/01/19   Swayze, Ava, DO  ferric gluconate 125 mg in sodium chloride 0.9 % 100 mL Inject 125 mg into the vein every Monday. 12/04/19   Angiulli, Lavon Paganini, PA-C  fluticasone (FLONASE) 50 MCG/ACT nasal spray Place 1 spray into both nostrils daily as needed for allergies or rhinitis.     [provider]  gabapentin (NEURONTIN) 300 MG capsule Take 1 capsule (300 mg total) by mouth 3 (three) times daily. 12/19/19   Raulkar, Clide Deutscher, MD  isosorbide mononitrate (IMDUR) 60 MG 24 hr tablet Take 1 tablet (60 mg total) by mouth daily. 12/28/19   Raulkar, Clide Deutscher, MD  lactulose (CHRONULAC) 10 GM/15ML solution Take 30 mLs (20 g total) by mouth 3 (three) times daily. 12/05/19   Angiulli, Lavon Paganini, PA-C  lidocaine (LIDODERM) 5 % Place 1 patch onto the skin daily. Remove & Discard patch within 12 hours or as directed by MD 12/04/19   Angiulli, Lavon Paganini, PA-C  meclizine (ANTIVERT) 25 MG tablet Take 1 tablet (25 mg total) by mouth every 8 (eight) hours as needed for dizziness. 12/19/19   Raulkar, Clide Deutscher, MD  Melatonin 3 MG TABS TAKE ONE TABLET BY MOUTH AT BEDTIME. 01/24/20   Raulkar, Clide Deutscher, MD  multivitamin  (RENA-VIT) TABS tablet Take 1 tablet by mouth daily after lunch. 12/04/19   Angiulli, Lavon Paganini, PA-C  ondansetron (ZOFRAN) 4 MG tablet Take 1 tablet (4 mg total) by mouth every 8 (eight) hours as needed for nausea or vomiting. 12/28/19   Raulkar, Clide Deutscher, MD  oxyCODONE (OXY IR/ROXICODONE) 5 MG immediate release tablet Take 1 tablet (5 mg total) by mouth every 6 (six) hours as needed for moderate pain. 12/19/19   Raulkar, Clide Deutscher, MD  pantoprazole (PROTONIX) 40 MG tablet Take 1 tablet (40 mg total) by mouth daily. 12/05/19   Angiulli, Lavon Paganini, PA-C  saccharomyces boulardii (FLORASTOR) 250 MG capsule Take 1 capsule (250 mg total) by mouth 2 (two) times daily. 12/04/19   Angiulli, Lavon Paganini, PA-C     Allergies:    No Known Allergies   Physical Exam:   Vitals  Blood pressure 130/86, pulse (!) 114, temperature 98.3 F (36.8 C), temperature source Oral, resp. rate 12, height 5\' 7"  (1.702 m), weight 65.8 kg, SpO2 97 %.  1.  General: Appears in no acute distress  2. Psychiatric: Alert, oriented x3, intact insight and judgment  3. Neurologic: Cranial nerves II through XII grossly intact  4. HEENMT:  Atraumatic normocephalic, extraocular muscles are intact  5. Respiratory : Clear to auscultation bilaterally  6. Cardiovascular : S1-S2, regular, no murmur auscultated  7. Gastrointestinal:  Abdomen is soft, nontender, no organomegaly     Data Review:    CBC Recent Labs  Lab 04/21/20 2155  WBC 9.9  HGB 13.4  HCT 43.5  PLT 170  MCV 89.3  MCH 27.5  MCHC 30.8  RDW 14.1  LYMPHSABS 1.0  MONOABS 0.3  EOSABS 0.0  BASOSABS 0.0   ------------------------------------------------------------------------------------------------------------------  Results for orders placed or performed during the hospital encounter of 04/21/20 (from the past 48 hour(s))  SARS Coronavirus 2 by RT PCR (  hospital order, performed in [facility] hospital lab) Nasopharyngeal Nasopharyngeal Swab      Status: Abnormal   Collection Time: 04/21/20  9:36 PM   Specimen: Nasopharyngeal Swab  Result Value Ref Range   SARS Coronavirus 2 POSITIVE (A) NEGATIVE    Comment: RESULT CALLED TO, READ BACK BY AND VERIFIED WITH: POINTDEXTER,M AT 2249 ON 04/21/2020 BY MOSLEY,J (NOTE) SARS-CoV-2 target nucleic acids are DETECTED SARS-CoV-2 RNA is generally detectable in upper respiratory specimens  during the acute phase of infection.  Positive results are indicative  of the presence of the identified virus, but do not rule out bacterial infection or co-infection with other pathogens not detected by the test.  Clinical correlation with patient history and  other diagnostic information is necessary to determine patient infection status.  The expected result is negative. Fact Sheet for Patients:   StrictlyIdeas.no  Fact Sheet for Healthcare Providers:   BankingDealers.co.za   This test is not yet approved or cleared by the [place] FDA and  has been authorized for detection and/or diagnosis of SARS-CoV-2 by FDA under an Emergency Use Authorization (EUA).  This EUA will remain in effect (meaning thi s test can be used) for the duration of  the COVID-19 declaration under Section 564(b)(1) of the Act, 21 U.S.C. section 360-bbb-3(b)(1), unless the authorization is terminated or revoked sooner. Performed at [facility], [address], [place], [place] [address]   CBC with Differential     Status: Abnormal   Collection Time: 04/21/20  9:55 PM  Result Value Ref Range   WBC 9.9 4.0 - 10.5 K/uL   RBC 4.87 3.87 - 5.11 MIL/uL   Hemoglobin 13.4 12.0 - 15.0 g/dL   HCT 43.5 36.0 - 46.0 %   MCV 89.3 80.0 - 100.0 fL   MCH 27.5 26.0 - 34.0 pg   MCHC 30.8 30.0 - 36.0 g/dL   RDW 14.1 11.5 - 15.5 %   Platelets 170 150 - 400 K/uL   nRBC 0.0 0.0 - 0.2 %   Neutrophils Relative % 87 %   Neutro Abs 8.6 (H) 1.7 - 7.7 K/uL   Lymphocytes Relative 10 %   Lymphs Abs  1.0 0.7 - 4.0 K/uL   Monocytes Relative 3 %   Monocytes Absolute 0.3 0.1 - 1.0 K/uL   Eosinophils Relative 0 %   Eosinophils Absolute 0.0 0.0 - 0.5 K/uL   Basophils Relative 0 %   Basophils Absolute 0.0 0.0 - 0.1 K/uL   Immature Granulocytes 0 %   Abs Immature Granulocytes 0.02 0.00 - 0.07 K/uL    Comment: Performed at [facility], [address], [place], [place] [address]  Comprehensive metabolic panel     Status: Abnormal   Collection Time: 04/21/20  9:55 PM  Result Value Ref Range   Sodium 139 135 - 145 mmol/L   Potassium 7.5 (HH) 3.5 - 5.1 mmol/L    Comment: NO VISIBLE HEMOLYSIS CRITICAL RESULT CALLED TO, READ BACK BY AND VERIFIED WITH: POINTDEXTER,T AT 2312 ON 04/21/2020 BY MOSLEY,J    Chloride 99 98 - 111 mmol/L   CO2 25 22 - 32 mmol/L   Glucose, Bld 204 (H) 70 - 99 mg/dL    Comment: Glucose reference range applies only to samples taken after fasting for at least 8 hours.   BUN 42 (H) 8 - 23 mg/dL   Creatinine, Ser 8.55 (H) 0.44 - 1.00 mg/dL   Calcium 9.5 8.9 - 10.3 mg/dL   Total Protein 8.5 (H) 6.5 -  8.1 g/dL   Albumin 4.0 3.5 - 5.0 g/dL   AST 20 15 - 41 U/L   ALT 18 0 - 44 U/L   Alkaline Phosphatase 130 (H) 38 - 126 U/L   Total Bilirubin 0.9 0.3 - 1.2 mg/dL   GFR calc non Af Amer 4 (L) >60 mL/min   GFR calc Af Amer 5 (L) >60 mL/min   Anion gap 15 5 - 15    Comment: Performed at [facility], [address], [place], [place] [address]  Lipase, blood     Status: None   Collection Time: 04/21/20  9:55 PM  Result Value Ref Range   Lipase 32 11 - 51 U/L    Comment: Performed at [facility], [address] [place], [place] [address]  Troponin I (High Sensitivity)     Status: None   Collection Time: 04/21/20  9:55 PM  Result Value Ref Range   Troponin I (High Sensitivity) 8 <18 ng/L    Comment: (NOTE) Elevated high sensitivity troponin I (hsTnI) values and significant  changes across serial measurements may suggest ACS but many other  chronic and acute conditions are  known to elevate hsTnI results.  Refer to the Links section for chest pain algorithms and additional  guidance. Performed at [facility], [address], [place], [place] [address]   Lactic acid, plasma     Status: None   Collection Time: 04/21/20  9:56 PM  Result Value Ref Range   Lactic Acid, Venous 1.7 0.5 - 1.9 mmol/L    Comment: Performed at [facility], [address], [place] [address]  POC CBG, ED     Status: Abnormal   Collection Time: 04/21/20 10:19 PM  Result Value Ref Range   Glucose-Capillary 210 (H) 70 - 99 mg/dL    Comment: Glucose reference range applies only to samples taken after fasting for at least 8 hours.  Potassium     Status: Abnormal   Collection Time: 04/22/20 12:14 AM  Result Value Ref Range   Potassium 5.9 (H) 3.5 - 5.1 mmol/L    Comment: DELTA CHECK NOTED Performed at [facility], [address], [place], [place] [address]     Chemistries  Recent Labs  Lab 04/21/20 2155 04/22/20 0014  NA 139  --   K 7.5* 5.9*  CL 99  --   CO2 25  --   GLUCOSE 204*  --   BUN 42*  --   CREATININE 8.55*  --   CALCIUM 9.5  --   AST 20  --   ALT 18  --   ALKPHOS 130*  --   BILITOT 0.9  --    ------------------------------------------------------------------------------------------------------------------  ------------------------------------------------------------------------------------------------------------------ GFR: Estimated Creatinine Clearance: 6 mL/min (A) (by C-G formula based on SCr of 8.55 mg/dL (H)). Liver Function Tests: Recent Labs  Lab 04/21/20 2155  AST 20  ALT 18  ALKPHOS 130*  BILITOT 0.9  PROT 8.5*  ALBUMIN 4.0   Recent Labs  Lab 04/21/20 2155  LIPASE 32   No results for input(s): AMMONIA in the last 168 hours. Coagulation Profile: No results for input(s): INR, PROTIME in the last 168 hours. Cardiac Enzymes: No results for input(s): CKTOTAL, CKMB, CKMBINDEX, TROPONINI in the last 168 hours. BNP (last 3  results) No results for input(s): PROBNP in the last 8760 hours. HbA1C: No results for input(s): HGBA1C in the last 72 hours. CBG: Recent Labs  Lab 04/21/20 2219  GLUCAP 210*   Lipid Profile: No  results for input(s): CHOL, HDL, LDLCALC, TRIG, CHOLHDL, LDLDIRECT in the last 72 hours. Thyroid Function Tests: No results for input(s): TSH, T4TOTAL, FREET4, T3FREE, THYROIDAB in the last 72 hours. Anemia Panel: No results for input(s): VITAMINB12, FOLATE, FERRITIN, TIBC, IRON, RETICCTPCT in the last 72 hours.  --------------------------------------------------------------------------------------------------------------- Urine analysis:    Component Value Date/Time   COLORURINE YELLOW 11/06/2019 1030   APPEARANCEUR CLOUDY (A) 11/06/2019 1030   LABSPEC 1.018 11/06/2019 1030   PHURINE 5.0 11/06/2019 1030   GLUCOSEU 50 (A) 11/06/2019 1030   HGBUR NEGATIVE 11/06/2019 1030   BILIRUBINUR NEGATIVE 11/06/2019 1030   KETONESUR NEGATIVE 11/06/2019 1030   PROTEINUR 100 (A) 11/06/2019 1030   NITRITE NEGATIVE 11/06/2019 1030   LEUKOCYTESUR SMALL (A) 11/06/2019 1030      Imaging Results:    DG Chest Port 1 View  Result Date: 04/21/2020 CLINICAL DATA:  Cough, wheezing, diarrhea. EXAM: PORTABLE CHEST 1 VIEW COMPARISON:  01/24/2020 FINDINGS: Right-sided dialysis catheter in place. The heart is normal in size. Unchanged mediastinal contours. Aortic atherosclerosis. No pulmonary edema. No focal airspace disease. No pleural fluid or pneumothorax. No acute osseous abnormalities are seen. IMPRESSION: No acute chest findings. Aortic Atherosclerosis (ICD10-I70.0). Electronically Signed   By: Keith Rake M.D.   On: 04/21/2020 22:27    My personal review of EKG: Rhythm NSR, tall T waves   Assessment & Plan:    Active Problems:   Hyperkalemia   1. Hyperkalemia-patient presented with potassium of 7.5, improved after she received IV insulin, D50, 1 g calcium chloride.  Repeat potassium is 5.9.   Patient will transfer to [facility] for hemodialysis. 2. COVID-19 infection-patient has SARS-CoV-2 RT-PCR positive, she does not have respiratory symptoms.  Not requiring oxygen.  Only has GI symptoms.  Chest x-ray shows no acute abnormality.  Will not initiate remdesivir or Decadron at this time.  Obtain CRP, ferritin, D-dimer.  Patient is already on Eliquis for right IJ DVT.  She will be continued on Eliquis. 3. Nausea and vomiting-likely from COVID-19 infection, improved with Reglan.  Continue Reglan 10 mg IV every 6 hours as needed. 4. ESRD on hemodialysis-patient is on hemodialysis Monday Wednesday Friday.  Last dialysis was on Friday.  At this time she will get dialysis at [place], [place]. 6. Right IJ DVT-patient is on Eliquis which will be continued.    DVT Prophylaxis-   apixaban  AM Labs Ordered, also please review Full Orders  Family Communication: Admission, patients condition and plan of care including tests being ordered have been discussed with the patient  who indicate understanding and agree with the plan and Code Status.  Code Status: Full code  Admission status: Observation/Inpatient :The appropriate admission status for this patient is INPATIENT. Inpatient status is judged to be reasonable and necessary in order to provide the required intensity of service to ensure the patient's safety. The patient's presenting symptoms, physical exam findings, and initial radiographic and laboratory data in the context of their chronic comorbidities is felt to place them at high risk for further clinical deterioration. Furthermore, it is not anticipated that the patient will be medically stable for discharge from the hospital within 2 midnights of admission. The following factors support the admission status of inpatient.     The patient's presenting symptoms include vomiting, diarrhea. The worrisome physical exam findings include  hyperkalemia. The initial radiographic and laboratory data are worrisome because of hyperkalemia, positive SARS-CoV-2 RT-PCR. The chronic co-morbidities include ESRD on hemodialysis.       *  I certify that at the point of admission it is my clinical judgment that the patient will require inpatient hospital care spanning beyond 2 midnights from the point of admission due to high intensity of service, high risk for further deterioration and high frequency of surveillance required.*  Time spent in minutes : 60 minutes   Allante Beane S Kaidyn Hernandes M.D

## 2020-04-22 NOTE — Progress Notes (Signed)
[facility] Progress Note  Subjective: 1.5 L off w/ HD today. K+ 7.5 overnight and 5.9 this am pre HD.   Vitals:   04/22/20 1030 04/22/20 1100 04/22/20 1130 04/22/20 1140  BP: 121/90 (!) 134/104 (!) 136/97 125/81  Pulse: 98 97 (!) 107 (!) 101  Resp:    17  Temp:    98 F (36.7 C)  TempSrc:    Oral  SpO2:    100%  Weight:    60.9 kg  Height:        Exam:  Patient not examined today directly given COVID-19 + status, utilizing data taken from chart +/- discussions w/ providers and staff.      OP HD: [facility]     From Jan 2021 > 3.5h  TDC (multiple AVF attempts failed)  62.5kg  2/2.5 bath  400/600  Summary: 69 yo w/ hx DM, CVA w/ residual L sided weakness, ESRD on HD. Here in Jan 2021 w/ CVA/ endocarditis and deconditioning. Last HD Friday. Presented to [facility] w/ n/v/d and K+ 7.5, found to be COVID+. Transferred to [facility] for dialysis and has now completed HD this am (5/31).   Assessment/ Plan: 1 Hyperkalemia- on HD this am, this should correct this 2 ESRD: normally MWF, suspect [facility]. HD today as above  3 HTN/ vol : no home BP meds noted on chart.  BP is okay, CXR clear.  4. Anemia of ESRD: does not appear to be an issue at this time, no ESA required  5. Metabolic Bone Disease: does not know home meds for this.  Eventually will obtain from her home unit  6. COVID pos-  In isolation.  No resp symptoms and no O2 req-  Per hosps 7 N/V/D- likely due to COVID- supportive care     Rob Shabria Egley 04/22/2020, 1:14 PM   Recent Labs  Lab 04/21/20 2155 04/22/20 0014  K 7.5* 5.9*  BUN 42*  --   CREATININE 8.55*  --   CALCIUM 9.5  --   HGB 13.4  --    Inpatient medications: . amLODipine  10 mg Oral Daily  . apixaban  5 mg Oral BID  . carvedilol  12.5 mg Oral BID  . Chlorhexidine Gluconate Cloth  6 each Topical Q0600  . insulin aspart  0-9 Units Subcutaneous TID WC  . isosorbide mononitrate  60 mg Oral Daily  . pantoprazole  40 mg Oral Daily     acetaminophen **OR** [DISCONTINUED] acetaminophen, hydrALAZINE, metoCLOPramide (REGLAN) injection, [DISCONTINUED] ondansetron **OR** ondansetron (ZOFRAN) IV

## 2020-04-22 NOTE — Progress Notes (Signed)
Was called by the ER regarding this dialysis patient who presented to [facility] with a potassium level of 7.5.  Has been treated with acute medications-  Was told the EKG is without changes.  There is a HD nurse in house performing HD at [facility].  Patient will be able to get HD faster at [facility]-  given the extremely high K -  feel this is most appropriate.   The HD nurse at [facility] was informed of this pts name-  As soon as she gets to [facility] should be able to go up to HD unit for treatment, HD orders have been written.  Louis Meckel

## 2020-04-22 NOTE — ED Notes (Signed)
Report given to Jasa, RN [facility]. 

## 2020-04-22 NOTE — Progress Notes (Addendum)
Patient arrived to floor at 0610 from [facility].  Dialysis called and notified that patient had arrived to floor as instructed by charge nurse.  Dialysis stated they would call back once they looked at the schedule.  Tele applied to patient and verified.  CHG bath given.  Patient oriented to unit.  Limb restrictions and falls bracelet applied to patient.  Vital signs obtained.    Dialysis called back for report.  Report given but assessment has not been completed as patient had just arrived to floor and immediate necessities have been taken thus far.   Blood sugar obtained. [place]

## 2020-04-22 NOTE — Consult Note (Signed)
Reason for Consult: To manage dialysis and dialysis related needs Referring Physician: Tracia Lacomb is an 69 y.o. female with DM, hx of CVA with residual left sided weakness, ESRD- we think at [facility], hospitalization in January of 2021 with endocarditis/CVA/deconditioning.  History is obtained from the chart as patient was determined to be COVID positive.   Her last HD was Friday at her OP unit.  She presented to the [facility] last night with c/o N/V/D.  Eval in the ER found K of 7.5-  Treated medically and latest K was 5.9.    She was then determined to be COVID positive.  Transferred to [facility] for emergent HD early this AM  As last we knew in Jan 2021  HD Outpatient: [facility] MWF  3.5h  TDC (mult AVF attempts failed)  62.5kg  2/2.5 bath 400/600    Past Medical History:  Diagnosis Date  . Arthritis    hands  . Constipation   . Diabetes mellitus without complication (HCC)    Type II  . ESRD (end stage renal disease) ([place])     M/W/F- Hemodialysis  . GERD (gastroesophageal reflux disease)   . Headache   . Hyperlipidemia   . Hypertension   . Irregular heart rate   . Stroke [facility])    TIA - approx 2010- no residual    Past Surgical History:  Procedure Laterality Date  . ABDOMINAL HYSTERECTOMY    . AORTIC ARCH ANGIOGRAPHY N/A 03/28/2019   Procedure: AORTIC ARCH ANGIOGRAPHY;  Surgeon: Waynetta Sandy, MD;  Location: [place] [facility];  Service: Cardiovascular;  Laterality: N/A;  . AV FISTULA PLACEMENT Left 06/13/2018   Procedure: ARTERIOVENOUS (AV) FISTULA CREATION;  Surgeon: Rosetta Posner, MD;  Location: [place];  Service: Vascular;  Laterality: Left;  . AV FISTULA PLACEMENT Left 08/18/2018   Procedure: INSERTION OF 4-7MM X 45CM ARTERIOVENOUS (AV) GORE-TEX GRAFT LEFT  FOREARM;  Surgeon: Rosetta Posner, MD;  Location: [place];  Service: Vascular;  Laterality: Left;  . AV FISTULA PLACEMENT Right 04/06/2019   Procedure: INSERTION OF ARTERIOVENOUS (AV)  GORE-TEX GRAFT RIGHT UPPER ARM;  Surgeon: Waynetta Sandy, MD;  Location: [place];  Service: Vascular;  Laterality: Right;  . AV FISTULA PLACEMENT Right 04/13/2019   Procedure: INSERTION OF ARTERIOVENOUS (AV) BOVINE  ARTEGRAFT GRAFT RIGHT UPPER EXTREMITY;  Surgeon: Serafina Mitchell, MD;  Location: [place];  Service: Vascular;  Laterality: Right;  . [place] REMOVAL Right 05/16/2019   Procedure: REMOVAL OF ARTERIOVENOUS GORETEX GRAFT ([place]) RIGHT ARM;  Surgeon: Angelia Mould, MD;  Location: [place];  Service: Vascular;  Laterality: Right;  . BASCILIC VEIN TRANSPOSITION Right 03/07/2019   Procedure: First Stage Bascilic Vein Transposition Right Arm;  Surgeon: Serafina Mitchell, MD;  Location: [place];  Service: Vascular;  Laterality: Right;  . CATARACT EXTRACTION Right 2005  . INSERTION OF DIALYSIS CATHETER N/A 08/18/2018   Procedure: INSERTION OF DIALYSIS CATHETER;  Surgeon: Rosetta Posner, MD;  Location: [place];  Service: Vascular;  Laterality: N/A;  . INSERTION OF DIALYSIS CATHETER Right 07/25/2019   Procedure: INSERTION OF DIALYSIS CATHETER RIGHT INTERNAL JUGULAR (TUNNELED);  Surgeon: Virl Cagey, MD;  Location: [facility];  Service: General;  Laterality: Right;  . IR FLUORO GUIDE CV LINE LEFT  10/26/2019  . IR FLUORO GUIDE CV LINE RIGHT  12/06/2018  . IR PTA ADDL CENTRAL DIALYSIS SEG THRU DIALY CIRCUIT RIGHT Right 12/06/2018  . IR REMOVAL TUN CV  CATH W/O FL  10/23/2019  . IR US GUIDE VASC ACCESS LEFT  10/26/2019  . TEE WITHOUT CARDIOVERSION N/A 10/26/2019   Procedure: TRANSESOPHAGEAL ECHOCARDIOGRAM (TEE);  Surgeon: Lelon Perla, MD;  Location: [place];  Service: Cardiovascular;  Laterality: N/A;  . THROMBECTOMY AND REVISION OF ARTERIOVENTOUS (AV) GORETEX  GRAFT Left 09/29/2018   Procedure: INSERTION OF ARTERIOVENTOUS (AV) GORETEX  GRAFT ARM;  Surgeon: Waynetta Sandy, MD;  Location: [place];  Service: Vascular;  Laterality: Left;  . UPPER EXTREMITY ANGIOGRAPHY Right 03/28/2019    Procedure: UPPER EXTREMITY ANGIOGRAPHY;  Surgeon: Waynetta Sandy, MD;  Location: [place] [facility];  Service: Cardiovascular;  Laterality: Right;  . VENOGRAM  10/27/2018   Procedure: Venogram;  Surgeon: Marty Heck, MD;  Location: [place] [facility];  Service: Cardiovascular;;  bilateral arm    Family History  Problem Relation Age of Onset  . Heart murmur Mother   . Heart failure Mother   . Diabetes Mother   . Cirrhosis Father   . Alcoholism Father     Social History:  reports that she has never smoked. She has never used smokeless tobacco. She reports that she does not drink alcohol or use drugs.  Allergies: No Known Allergies  Medications: I have reviewed the patient's current medications.  Results for orders placed or performed during the hospital encounter of 04/21/20 (from the past 48 hour(s))  SARS Coronavirus 2 by RT PCR (hospital order, performed in [facility] hospital lab) Nasopharyngeal Nasopharyngeal Swab     Status: Abnormal   Collection Time: 04/21/20  9:36 PM   Specimen: Nasopharyngeal Swab  Result Value Ref Range   SARS Coronavirus 2 POSITIVE (A) NEGATIVE    Comment: RESULT CALLED TO, READ BACK BY AND VERIFIED WITH: POINTDEXTER,M AT 2249 ON 04/21/2020 BY MOSLEY,J (NOTE) SARS-CoV-2 target nucleic acids are DETECTED SARS-CoV-2 RNA is generally detectable in upper respiratory specimens  during the acute phase of infection.  Positive results are indicative  of the presence of the identified virus, but do not rule out bacterial infection or co-infection with other pathogens not detected by the test.  Clinical correlation with patient history and  other diagnostic information is necessary to determine patient infection status.  The expected result is negative. Fact Sheet for Patients:   StrictlyIdeas.no  Fact Sheet for Healthcare Providers:   BankingDealers.co.za   This test is not yet approved or  cleared by the [place] FDA and  has been authorized for detection and/or diagnosis of SARS-CoV-2 by FDA under an Emergency Use Authorization (EUA).  This EUA will remain in effect (meaning thi s test can be used) for the duration of  the COVID-19 declaration under Section 564(b)(1) of the Act, 21 U.S.C. section 360-bbb-3(b)(1), unless the authorization is terminated or revoked sooner. Performed at [facility], [address] [place], [place] [address]   CBC with Differential     Status: Abnormal   Collection Time: 04/21/20  9:55 PM  Result Value Ref Range   WBC 9.9 4.0 - 10.5 K/uL   RBC 4.87 3.87 - 5.11 MIL/uL   Hemoglobin 13.4 12.0 - 15.0 g/dL   HCT 43.5 36.0 - 46.0 %   MCV 89.3 80.0 - 100.0 fL   MCH 27.5 26.0 - 34.0 pg   MCHC 30.8 30.0 - 36.0 g/dL   RDW 14.1 11.5 - 15.5 %   Platelets 170 150 - 400 K/uL   nRBC 0.0 0.0 - 0.2 %   Neutrophils Relative %  87 %   Neutro Abs 8.6 (H) 1.7 - 7.7 K/uL   Lymphocytes Relative 10 %   Lymphs Abs 1.0 0.7 - 4.0 K/uL   Monocytes Relative 3 %   Monocytes Absolute 0.3 0.1 - 1.0 K/uL   Eosinophils Relative 0 %   Eosinophils Absolute 0.0 0.0 - 0.5 K/uL   Basophils Relative 0 %   Basophils Absolute 0.0 0.0 - 0.1 K/uL   Immature Granulocytes 0 %   Abs Immature Granulocytes 0.02 0.00 - 0.07 K/uL    Comment: Performed at [facility], [address], [place], [place] [address]  Comprehensive metabolic panel     Status: Abnormal   Collection Time: 04/21/20  9:55 PM  Result Value Ref Range   Sodium 139 135 - 145 mmol/L   Potassium 7.5 (HH) 3.5 - 5.1 mmol/L    Comment: NO VISIBLE HEMOLYSIS CRITICAL RESULT CALLED TO, READ BACK BY AND VERIFIED WITH: POINTDEXTER,T AT 2312 ON 04/21/2020 BY MOSLEY,J    Chloride 99 98 - 111 mmol/L   CO2 25 22 - 32 mmol/L   Glucose, Bld 204 (H) 70 - 99 mg/dL    Comment: Glucose reference range applies only to samples taken after fasting for at least 8 hours.   BUN 42 (H) 8 - 23 mg/dL   Creatinine, Ser 8.55 (H)  0.44 - 1.00 mg/dL   Calcium 9.5 8.9 - 10.3 mg/dL   Total Protein 8.5 (H) 6.5 - 8.1 g/dL   Albumin 4.0 3.5 - 5.0 g/dL   AST 20 15 - 41 U/L   ALT 18 0 - 44 U/L   Alkaline Phosphatase 130 (H) 38 - 126 U/L   Total Bilirubin 0.9 0.3 - 1.2 mg/dL   GFR calc non Af Amer 4 (L) >60 mL/min   GFR calc Af Amer 5 (L) >60 mL/min   Anion gap 15 5 - 15    Comment: Performed at [facility], [address], [place], [place] [address]  Lipase, blood     Status: None   Collection Time: 04/21/20  9:55 PM  Result Value Ref Range   Lipase 32 11 - 51 U/L    Comment: Performed at [facility], [address] [place], [place] [address]  Troponin I (High Sensitivity)     Status: None   Collection Time: 04/21/20  9:55 PM  Result Value Ref Range   Troponin I (High Sensitivity) 8 <18 ng/L    Comment: (NOTE) Elevated high sensitivity troponin I (hsTnI) values and significant  changes across serial measurements may suggest ACS but many other  chronic and acute conditions are known to elevate hsTnI results.  Refer to the Links section for chest pain algorithms and additional  guidance. Performed at [facility], [address], [place], [place] [address]   Lactic acid, plasma     Status: None   Collection Time: 04/21/20  9:56 PM  Result Value Ref Range   Lactic Acid, Venous 1.7 0.5 - 1.9 mmol/L    Comment: Performed at [facility], [address], [place],  [address]  POC CBG, ED     Status: Abnormal   Collection Time: 04/21/20 10:19 PM  Result Value Ref Range   Glucose-Capillary 210 (H) 70 - 99 mg/dL    Comment: Glucose reference range applies only to samples taken after fasting for at least 8 hours.  Potassium     Status: Abnormal   Collection Time: 04/22/20 12:14 AM  Result Value Ref Range   Potassium 5.9 (  H) 3.5 - 5.1 mmol/L    Comment: DELTA CHECK NOTED Performed at [facility], [address], [place], [place] [address]     DG Chest Port 1 View  Result Date: 04/21/2020 CLINICAL DATA:   Cough, wheezing, diarrhea. EXAM: PORTABLE CHEST 1 VIEW COMPARISON:  01/24/2020 FINDINGS: Right-sided dialysis catheter in place. The heart is normal in size. Unchanged mediastinal contours. Aortic atherosclerosis. No pulmonary edema. No focal airspace disease. No pleural fluid or pneumothorax. No acute osseous abnormalities are seen. IMPRESSION: No acute chest findings. Aortic Atherosclerosis (ICD10-I70.0). Electronically Signed   By: Keith Rake M.D.   On: 04/21/2020 22:27    ROS: not obtained from the patient-  Per chart positive for N/V/D Blood pressure (!) 142/72, pulse (!) 114, temperature 98.7 F (37.1 C), temperature source Oral, resp. rate 18, height 5\' 7"  (1.702 m), weight 65.8 kg, SpO2 97 %. PE- not obtained secondary to COVID positive status  Assessment/Plan: 69 year old BF with ESRD presenting with N/V/D and found to have a K of 7.5 1 Hyperkalemia-  ESRD-  Did get HD on schedule last on Monday.  Will plan for emergent HD this AM to bring K down.  Did come down some with medical management overnight  2 ESRD: normally MWF-  We think at [facility] will be continued on schedule  3 Hypertension: no home BP meds noted on chart-  BP is reasonable-  No mention of volume overload on exam-  Minimal UF 4. Anemia of ESRD: does not appear to be an issue at this time, no ESA required  5. Metabolic Bone Disease: do not know home meds for this.  Eventually will obtain from her home unit  6. COVID pos-  In isolation.  No resp symptoms and no O2 req-  Per hosps 7 N/V/D- likely due to COVID- supportive care    Louis Meckel 04/22/2020, 6:27 AM

## 2020-04-23 ENCOUNTER — Inpatient Hospital Stay (HOSPITAL_COMMUNITY): Payer: Medicare PPO

## 2020-04-23 DIAGNOSIS — G9341 Metabolic encephalopathy: Secondary | ICD-10-CM

## 2020-04-23 LAB — HEMOGLOBIN A1C
Hgb A1c MFr Bld: 6.9 % — ABNORMAL HIGH (ref 4.8–5.6)
Mean Plasma Glucose: 151.33 mg/dL

## 2020-04-23 LAB — BRAIN NATRIURETIC PEPTIDE: B Natriuretic Peptide: 220.1 pg/mL — ABNORMAL HIGH (ref 0.0–100.0)

## 2020-04-23 LAB — BASIC METABOLIC PANEL
Anion gap: 19 — ABNORMAL HIGH (ref 5–15)
BUN: 26 mg/dL — ABNORMAL HIGH (ref 8–23)
CO2: 23 mmol/L (ref 22–32)
Calcium: 9.5 mg/dL (ref 8.9–10.3)
Chloride: 95 mmol/L — ABNORMAL LOW (ref 98–111)
Creatinine, Ser: 6.57 mg/dL — ABNORMAL HIGH (ref 0.44–1.00)
GFR calc Af Amer: 7 mL/min — ABNORMAL LOW (ref >60)
GFR calc non Af Amer: 6 mL/min — ABNORMAL LOW (ref >60)
Glucose, Bld: 89 mg/dL (ref 70–99)
Potassium: 6.8 mmol/L (ref 3.5–5.1)
Sodium: 137 mmol/L (ref 135–145)

## 2020-04-23 LAB — POTASSIUM: Potassium: 7 mmol/L (ref 3.5–5.1)

## 2020-04-23 LAB — CBC
HCT: 43.3 % (ref 36.0–46.0)
Hemoglobin: 13.4 g/dL (ref 12.0–15.0)
MCH: 27.6 pg (ref 26.0–34.0)
MCHC: 30.9 g/dL (ref 30.0–36.0)
MCV: 89.1 fL (ref 80.0–100.0)
Platelets: 116 10*3/uL — ABNORMAL LOW (ref 150–400)
RBC: 4.86 MIL/uL (ref 3.87–5.11)
RDW: 14.1 % (ref 11.5–15.5)
WBC: 8.6 10*3/uL (ref 4.0–10.5)
nRBC: 0 % (ref 0.0–0.2)

## 2020-04-23 LAB — GLUCOSE, CAPILLARY
Glucose-Capillary: 77 mg/dL (ref 70–99)
Glucose-Capillary: 133 mg/dL — ABNORMAL HIGH (ref 70–99)
Glucose-Capillary: 120 mg/dL — ABNORMAL HIGH (ref 70–99)
Glucose-Capillary: 101 mg/dL — ABNORMAL HIGH (ref 70–99)

## 2020-04-23 LAB — VITAMIN B12: Vitamin B-12: 568 pg/mL (ref 180–914)

## 2020-04-23 LAB — MAGNESIUM: Magnesium: 1.9 mg/dL (ref 1.7–2.4)

## 2020-04-23 LAB — TSH: TSH: 0.736 u[IU]/mL (ref 0.350–4.500)

## 2020-04-23 LAB — AMMONIA: Ammonia: 32 umol/L (ref 9–35)

## 2020-04-23 LAB — C-REACTIVE PROTEIN: CRP: 0.6 mg/dL (ref <1.0)

## 2020-04-23 MED ORDER — CALCIUM GLUCONATE-NACL 1-0.675 GM/50ML-% IV SOLN
1.0000 g | Freq: Once | INTRAVENOUS | Status: DC
  Filled 2020-04-23: qty 50

## 2020-04-23 MED ORDER — DEXTROSE 50 % IV SOLN
1.0000 | Freq: Once | INTRAVENOUS | Status: DC
  Filled 2020-04-23: qty 50

## 2020-04-23 MED ORDER — SODIUM POLYSTYRENE SULFONATE 15 GM/60ML PO SUSP
30.0000 g | Freq: Once | ORAL | Status: AC
  Administered 2020-04-23: 30 g via ORAL
  Filled 2020-04-23: qty 120

## 2020-04-23 MED ORDER — HEPARIN SODIUM (PORCINE) 1000 UNIT/ML IJ SOLN
INTRAMUSCULAR | Status: AC
  Filled 2020-04-23: qty 10

## 2020-04-23 MED ORDER — SODIUM BICARBONATE 8.4 % IV SOLN
50.0000 meq | Freq: Once | INTRAVENOUS | Status: AC
  Administered 2020-04-23: 50 meq via INTRAVENOUS
  Filled 2020-04-23: qty 50

## 2020-04-23 MED ORDER — CALCIUM GLUCONATE 10 % IV SOLN: 1.0000 g | Freq: Once | INTRAVENOUS | Status: DC

## 2020-04-23 MED ORDER — ALBUTEROL SULFATE (2.5 MG/3ML) 0.083% IN NEBU: 10.0000 mg | INHALATION_SOLUTION | Freq: Once | RESPIRATORY_TRACT | Status: DC

## 2020-04-23 MED ORDER — INSULIN ASPART 100 UNIT/ML IV SOLN: 10.0000 [IU] | Freq: Once | INTRAVENOUS | Status: DC

## 2020-04-23 MED ORDER — CHLORHEXIDINE GLUCONATE CLOTH 2 % EX PADS
6.0000 | MEDICATED_PAD | Freq: Every day | CUTANEOUS | Status: DC
  Administered 2020-04-23 – 2020-04-24 (×2): 6 via TOPICAL

## 2020-04-23 MED ORDER — SODIUM CHLORIDE 0.9 % IV SOLN
1.0000 g | INTRAVENOUS | Status: DC
  Administered 2020-04-24 – 2020-04-25 (×3): 1 g via INTRAVENOUS
  Filled 2020-04-23 (×3): qty 10

## 2020-04-23 NOTE — Progress Notes (Addendum)
UA ordered.  In and out cath attempted twice.  Unable to collect specimen.  Both times the catheter clogged before urine drained with a thick white substance.  Dr. Sidney Ace notified.  2340- Dr. Mitchell Heir requested another attempt for a UA to be tried.  Will Bonnet, Agricultural consultant, notified and will attempt to collect the UA.  New orders to start IV antibiotics. See MAR.  0200- Charge RN attempted to collect the UA.  Unsuccessful.  Same issue as described above with the catheter being occluded with thick white pus. MD notified.  Order for MRI still in and radiology aware.  Spoke with them at the beginning of shift. It was not a STAT order so it will be done as soon as they can get to patient.

## 2020-04-23 NOTE — Evaluation (Signed)
Physical Therapy Evaluation Patient Details Name: [name] MRN: [id number] DOB: 1951/10/18 Today's Date: 04/23/2020   History of Present Illness  Pt adm with nausea, vomiting,and diarrhea and found to have Covid 19. PMH - ESRD on HD, rt CVA 09/2020, htn, endocarditis, DM  Clinical Impression  Pt admitted with above diagnosis and presents to PT with functional limitations due to deficits listed below (See PT problem list). Pt needs skilled PT to maximize independence and safety to allow discharge to home with family. Today pt's confusion limiting her amb distance.      Follow Up Recommendations Home health PT;Supervision/Assistance - 24 hour    Equipment Recommendations  None recommended by PT    Recommendations for Other Services       Precautions / Restrictions Precautions Precautions: Fall Restrictions Weight Bearing Restrictions: No      Mobility  Bed Mobility Overal bed mobility: Needs Assistance Bed Mobility: Sidelying to Sit;Sit to Sidelying   Sidelying to sit: Min assist     Sit to sidelying: Supervision General bed mobility comments: Assist to initiate bringing legs off of bed and elevating trunk into sitting.   Transfers Overall transfer level: Needs assistance Equipment used: Rolling walker (2 wheeled) Transfers: Sit to/from Stand Sit to Stand: Min assist         General transfer comment: Assist to bring hips up and for balance  Ambulation/Gait Ambulation/Gait assistance: Min assist Gait Distance (Feet): 3 Feet Assistive device: Rolling walker (2 wheeled) Gait Pattern/deviations: Step-to pattern;Decreased step length - right;Decreased step length - left;Trunk flexed Gait velocity: decr Gait velocity interpretation: <1.31 ft/sec, indicative of household ambulator General Gait Details: Assist for balance and support. Pt side stepped up side of bed. After sitting down pt returned to supine spontaneously.  Stairs            Wheelchair  Mobility    Modified Rankin (Stroke Patients Only)       Balance Overall balance assessment: Needs assistance Sitting-balance support: No upper extremity supported;Feet supported Sitting balance-Leahy Scale: Fair     Standing balance support: Bilateral upper extremity supported Standing balance-Leahy Scale: Poor Standing balance comment: walker and min assist for static standing                             Pertinent Vitals/Pain Pain Assessment: No/denies pain    Home Living Family/patient expects to be discharged to:: Private residence Living Arrangements: Children Available Help at Discharge: Family;Available 24 hours/day Type of Home: House Home Access: Stairs to enter Entrance Stairs-Rails: Right;Left;Can reach both Entrance Stairs-Number of Steps: 3 Home Layout: One level Home Equipment: Wheelchair - manual;Cane - single point;Walker - 2 wheels      Prior Function Level of Independence: Needs assistance   Gait / Transfers Assistance Needed: Uses w/c. Amb with rolling walker with assist     Comments: Info from prior encounter     Hand Dominance   Dominant Hand: Right    Extremity/Trunk Assessment   Upper Extremity Assessment Upper Extremity Assessment: Defer to OT evaluation    Lower Extremity Assessment Lower Extremity Assessment: Generalized weakness       Communication   Communication: No difficulties  Cognition Arousal/Alertness: Awake/alert Behavior During Therapy: WFL for tasks assessed/performed Overall Cognitive Status: No family/caregiver present to determine baseline cognitive functioning Area of Impairment: Orientation;Attention;Memory;Following commands;Safety/judgement;Problem solving                 Orientation Level: Disoriented to;Time;Situation Current  Attention Level: Sustained Memory: Decreased short-term memory Following Commands: Follows one step commands inconsistently Safety/Judgement: Decreased awareness  of safety;Decreased awareness of deficits   Problem Solving: Requires verbal cues;Requires tactile cues        General Comments      Exercises     Assessment/Plan    PT Assessment Patient needs continued PT services  PT Problem List Decreased strength;Decreased activity tolerance;Decreased balance;Decreased mobility;Decreased cognition       PT Treatment Interventions DME instruction;Gait training;Functional mobility training;Therapeutic activities;Therapeutic exercise;Balance training;Patient/family education    PT Goals (Current goals can be found in the Care Plan section)  Acute Rehab PT Goals Patient Stated Goal: not stated PT Goal Formulation: With patient Time For Goal Achievement: 05/07/20 Potential to Achieve Goals: Good    Frequency Min 3X/week   Barriers to discharge        Co-evaluation               AM-PAC PT "6 Clicks" Mobility  Outcome Measure Help needed turning from your back to your side while in a flat bed without using bedrails?: None Help needed moving from lying on your back to sitting on the side of a flat bed without using bedrails?: A Little Help needed moving to and from a bed to a chair (including a wheelchair)?: A Little Help needed standing up from a chair using your arms (e.g., wheelchair or bedside chair)?: A Little Help needed to walk in hospital room?: A Little Help needed climbing 3-5 steps with a railing? : A Lot 6 Click Score: 18    End of Session   Activity Tolerance: Patient tolerated treatment well Patient left: in bed;with call bell/phone within reach;with bed alarm set;with nursing/sitter in room Nurse Communication: Mobility status PT Visit Diagnosis: Other abnormalities of gait and mobility (R26.89);Muscle weakness (generalized) (M62.81)    Time: 6811-5726 PT Time Calculation (min) (ACUTE ONLY): 11 min   Charges:   PT Evaluation $PT Eval Low Complexity: [facility] Pager [phone] Office [place] 04/23/2020, 2:10 PM

## 2020-04-23 NOTE — Progress Notes (Deleted)
[facility] Progress Note  Subjective: K+ 6.9 this am, pt drowsy and disoriented, no c/o's.   Vitals:   04/23/20 0443 04/23/20 0957 04/23/20 0959 04/23/20 1000  BP: (!) 151/95 121/84 121/84 116/86  Pulse: 94 (!) 101 (!) 101 100  Resp: 18 11 19 15   Temp: 98 F (36.7 C) 99.4 F (37.4 C) 99.4 F (37.4 C)   TempSrc: Oral Axillary Axillary   SpO2: 100% 97% 96% 100%  Weight:      Height:        Exam:   Drowsy, awakens, O x1, responsvie  no jvd  Chest cta bilat  Cor reg no RG  Abd soft ntnd no ascites   Ext no LE edema   Alert, NF, Ox 1  RIJ TDC      OP HD: [facility]     From Jan 2021 > 3.5h  TDC (multiple AVF attempts failed)  62.5kg  2/2.5 bath  400/600  Summary: 69 yo w/ hx DM, CVA w/ residual L sided weakness, ESRD on HD. Here in Jan 2021 w/ CVA/ endocarditis and deconditioning. Last HD Friday. Presented to [facility] w/ n/v/d and K+ 7.5, found to be COVID+. Transferred to [facility] for dialysis and has now completed HD this am (5/31).   Assessment/ Plan: 1 Hyperkalemia- refractory to HD yest. Is on non-renal diet. Plan HD again today w/ low K+ bath. Change to renal diet (low K diet).  2 ESRD: normally MWF. Extra HD as above today 3 HTN/ vol : no home BP meds noted on chart.  BP okay, CXR clear.  4. Anemia of ESRD: does not appear to be an issue at this time, no ESA required  5. MBD ckd: does not know home meds for this.  Eventually will obtain from her home unit  6. COVID pos-  In isolation.  No resp symptoms and no O2 req-  Per hosps 7 N/V/D- likely due to COVID- supportive care     Rob Kamonte Mcmichen 04/23/2020, 11:04 AM   Recent Labs  Lab 04/21/20 2155 04/21/20 2155 04/22/20 0014 04/23/20 0541  K 7.5*   < > 5.9* 6.8*  BUN 42*  --   --  26*  CREATININE 8.55*  --   --  6.57*  CALCIUM 9.5  --   --  9.5  HGB 13.4  --   --  13.4   < > = values in this interval not displayed.   Inpatient medications: . amLODipine  10 mg Oral Daily  . apixaban  5 mg Oral BID   . carvedilol  12.5 mg Oral BID  . Chlorhexidine Gluconate Cloth  6 each Topical Q0600  . Chlorhexidine Gluconate Cloth  6 each Topical Q0600  . insulin aspart  0-9 Units Subcutaneous TID WC  . isosorbide mononitrate  60 mg Oral Daily  . pantoprazole  40 mg Oral Daily    acetaminophen **OR** [DISCONTINUED] acetaminophen, hydrALAZINE, metoCLOPramide (REGLAN) injection, [DISCONTINUED] ondansetron **OR** ondansetron (ZOFRAN) IV

## 2020-04-23 NOTE — Progress Notes (Addendum)
PROGRESS NOTE                                                                                                                                                                                                             Patient Demographics:    [name], is a 69 y.o. female, DOB - October 25, 1951, JJO:[id number]  Outpatient Primary MD for the patient is Sasser, Silvestre Moment, MD    LOS - 1  Admit date - 04/21/2020    Chief Complaint  Patient presents with  . Emesis       Brief Narrative - 69 y.o. 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   Subjective:   Patient in bed, appears comfortable, denies any headache, no fever, no chest pain or pressure, no shortness of breath , no abdominal pain. No focal weakness.  Minimally confused much improved than yesterday.   Assessment  & Plan :     1. Acute COVID-19 infection in a patient who has been vaccinated - she seems to have no pulmonary symptoms, chest x-ray clear, minimal nausea vomiting and diarrhea likely due to COVID-19 gastroenteritis which seems to have improved already.  Will monitor closely including her inflammatory markers.  Encouraged the patient to sit up in chair in the daytime use I-S and flutter valve for pulmonary toiletry and then prone in bed when at night.  Will advance activity and titrate down oxygen as possible.  SpO2: 100 %  Recent Labs  Lab 04/21/20 2136  SARSCOV2NAA POSITIVE*    Hepatic Function  Latest Ref Rng & Units 04/21/2020 12/04/2019 12/01/2019  Total Protein 6.5 - 8.1 g/dL 8.5(H) - -  Albumin 3.5 - 5.0 g/dL 4.0 1.9(L) 2.0(L)  AST 15 - 41 U/L 20 - -  ALT 0 - 44 U/L 18 - -  Alk Phosphatase 38 - 126 U/L 130(H) - -  Total Bilirubin 0.3 - 1.2 mg/dL 0.9 - -  2.  Severe hyperkalemia in a patient with ESRD on MWF - Potassium is high again after dialysis on 04/22/2020, IV bicarb, Lokelma and Kayexalate administered, will defer HD adjustments to renal who is following, repeat potassium in 4 hours, continue telemetry.  3.  Mild hospital-acquired delirium with some element of metabolic encephalopathy.  Supportive care.  Minimize narcotics and benzodiazepines, according to the son this happened last admission as well.  Supportive care and monitor.  Better today than yesterday.  Addendum - at 5 PM during urgent dialysis session which was initiated for hyperkalemia patient was reportedly less responsive, came and saw the patient, she is able to answer all questions and follow basic commands but is slightly more drowsy and then this morning, she is hemodynamically stable, CBGs stable, keep head of the bed at 35 degrees, clear liquid diet with feeding assistance aspiration precautions, clearly maintaining her airway but will be monitored closely, since dialysis is urgently needed we will finished dialysis after do a head CT, EEG, ammonia added to a.m. blood draw, ABG draw has failed after multiple attempts but I do not think patient is retaining CO2, no narcotics or sedative medications administered.  Will request neurology input to and monitor.   4.  Essential hypertension.  Home medications continued Norvasc added for better control.  5.  History of mitral valve endocarditis with septic embolism.  Supportive care.    6.  History of right IJ DVT.  On Eliquis continue.    7. CVA with minimal left-sided weakness.  Supportive care.    8. DM. type II.  Will place on sliding scale and monitor.  Lab  Results  Component Value Date   HGBA1C 6.9 (H) 04/23/2020   CBG (last 3)  Recent Labs    04/22/20 1700 04/22/20 2006 04/23/20 0746  GLUCAP 119* 95 77     Condition - Fair  Family Communication  :  Son [name] [phone] on 04/22/20, 04/23/20   Code Status :  Full  Consults  :  Renal  Procedures  :  HD  PUD Prophylaxis : PPI  Disposition Plan  :    Status is: Inpatient  Remains inpatient appropriate because:Unsafe d/c plan   Dispo: The patient is from: Home              Anticipated d/c is to: Home              Anticipated d/c date is: 1-2 days              Patient currently is not medically stable to d/c.  SRD with severe hyperkalemia, now COVID-19 positive and outpatient dialysis facility will not take her back currently.   DVT Prophylaxis  :  Heparin    Lab Results  Component Value Date   PLT 170 04/21/2020    Diet :  Diet Order            DIET SOFT Room service appropriate? Yes; Fluid consistency: Thin  Diet effective now               Inpatient Medications  Scheduled Meds: . amLODipine  10 mg Oral Daily  . apixaban  5 mg Oral BID  . carvedilol  12.5 mg Oral BID  . Chlorhexidine Gluconate Cloth  6 each Topical Q0600  . insulin aspart  0-9 Units Subcutaneous TID WC  . isosorbide mononitrate  60 mg Oral Daily  . pantoprazole  40 mg Oral Daily  . sodium bicarbonate  50 mEq  Intravenous Once  . sodium polystyrene  30 g Oral Once   Continuous Infusions: PRN Meds:.acetaminophen **OR** [DISCONTINUED] acetaminophen, hydrALAZINE, metoCLOPramide (REGLAN) injection, [DISCONTINUED] ondansetron **OR** ondansetron (ZOFRAN) IV  Antibiotics  :    Anti-infectives (From admission, onward)   None       Time Spent in minutes  30   Lala Lund M.D on 04/23/2020 at 9:20 AM  To page go to [email or web address] - password [facility]  [facility] -  Office  [phone]     See all Orders from today for further details    Objective:   Vitals:   04/22/20  1140 04/22/20 1449 04/22/20 1946 04/23/20 0443  BP: 125/81 (!) 111/91 115/82 (!) 151/95  Pulse: (!) 101 (!) 104 (!) 105 94  Resp: _0 Temp: 98 F (36.7 C) 97.9 F (36.6 C) 98.9 F (37.2 C) 98 F (36.7 C)  TempSrc: Oral Oral Oral Oral  SpO2: 100% 100% 100% 100%  Weight: 60.9 kg     Height:        Wt Readings from Last 3 Encounters:  04/22/20 60.9 kg  02/01/20 65.8 kg  01/24/20 65.8 kg     Intake/Output Summary (Last 24 hours) at 04/23/2020 0920 Last data filed at 04/22/2020 1140 Gross per 24 hour  Intake --  Output 1516 ml  Net -1516 ml     Physical Exam  Awake, minimally confused, minimal left-sided weakness, Normal affect [place].AT,PERRAL Supple Neck,No JVD, No cervical lymphadenopathy appriciated.  Symmetrical Chest wall movement, Good air movement bilaterally, CTAB RRR,No Gallops, Rubs or new Murmurs, No Parasternal Heave +ve B.Sounds, Abd Soft, No tenderness, No organomegaly appriciated, No rebound - guarding or rigidity. No Cyanosis, Clubbing or edema, No new Rash or bruise    Data Review:    CBC Recent Labs  Lab 04/21/20 2155  WBC 9.9  HGB 13.4  HCT 43.5  PLT 170  MCV 89.3  MCH 27.5  MCHC 30.8  RDW 14.1  LYMPHSABS 1.0  MONOABS 0.3  EOSABS 0.0  BASOSABS 0.0    Chemistries  Recent Labs  Lab 04/21/20 2155 04/22/20 0014 04/23/20 0541  NA 139  --  137  K 7.5* 5.9* 6.8*  CL 99  --  95*  CO2 25  --  23  GLUCOSE 204*  --  89  BUN 42*  --  26*  CREATININE 8.55*  --  6.57*  CALCIUM 9.5  --  9.5  AST 20  --   --   ALT 18  --   --   ALKPHOS 130*  --   --   BILITOT 0.9  --   --   MG  --   --  1.9  HGBA1C  --   --  6.9*     ------------------------------------------------------------------------------------------------------------------ No results for input(s): CHOL, HDL, LDLCALC, TRIG, CHOLHDL, LDLDIRECT in the last 72 hours.  Lab Results  Component Value Date   HGBA1C 6.9 (H) 04/23/2020    ------------------------------------------------------------------------------------------------------------------ No results for input(s): TSH, T4TOTAL, T3FREE, THYROIDAB in the last 72 hours.  Invalid input(s): FREET3  Cardiac Enzymes No results for input(s): CKMB, TROPONINI, MYOGLOBIN in the last 168 hours.  Invalid input(s): CK ------------------------------------------------------------------------------------------------------------------ No results found for: BNP  Micro Results Recent Results (from the past 240 hour(s))  SARS Coronavirus 2 by RT PCR (hospital order, performed in [facility] hospital lab) Nasopharyngeal Nasopharyngeal Swab     Status: Abnormal   Collection Time: 04/21/20  9:36 PM   Specimen: Nasopharyngeal Swab  Result Value Ref Range Status   SARS Coronavirus 2 POSITIVE (A) NEGATIVE Final    Comment: RESULT CALLED TO, READ BACK BY AND VERIFIED WITH: POINTDEXTER,M AT 2249 ON 04/21/2020 BY MOSLEY,J (NOTE) SARS-CoV-2 target nucleic acids are DETECTED SARS-CoV-2 RNA is generally detectable in upper respiratory specimens  during the acute phase of infection.  Positive results are indicative  of the presence of the identified virus, but do not rule out bacterial infection or co-infection with other pathogens not detected by the test.  Clinical correlation with patient history and  other diagnostic information is necessary to determine patient infection status.  The expected result is negative. Fact Sheet for Patients:   StrictlyIdeas.no  Fact Sheet for Healthcare Providers:   BankingDealers.co.za   This test is not yet approved or cleared by the [place] FDA and  has been authorized for detection and/or diagnosis of SARS-CoV-2 by FDA under an Emergency Use Authorization (EUA).  This EUA will remain in effect (meaning thi s test can be used) for the duration of  the COVID-19 declaration under Section  564(b)(1) of the Act, 21 U.S.C. section 360-bbb-3(b)(1), unless the authorization is terminated or revoked sooner. Performed at [facility], [address] [place], [place] [address]     Radiology Reports DG Chest [place] 1 View  Result Date: 04/21/2020 CLINICAL DATA:  Cough, wheezing, diarrhea. EXAM: PORTABLE CHEST 1 VIEW COMPARISON:  01/24/2020 FINDINGS: Right-sided dialysis catheter in place. The heart is normal in size. Unchanged mediastinal contours. Aortic atherosclerosis. No pulmonary edema. No focal airspace disease. No pleural fluid or pneumothorax. No acute osseous abnormalities are seen. IMPRESSION: No acute chest findings. Aortic Atherosclerosis (ICD10-I70.0). Electronically Signed   By: Keith Rake M.D.   On: 04/21/2020 22:27

## 2020-04-23 NOTE — Progress Notes (Signed)
SLP Cancellation Note  Patient Details Name: [name] MRN: [id number] DOB: 11-Aug-1951   Cancelled treatment:        Pt currently in dialysis. Will reattempt tomorrow for swallow assessment.    Houston Siren 04/23/2020, 3:23 PM   Orbie Pyo Colvin Caroli.Ed Risk analyst [phone] Office [phone]

## 2020-04-23 NOTE — Consult Note (Signed)
Neurology Consultation  Reason for Consult: Altered mental status Referring Physician: Dr. Lala Lund, [facility]  CC: Altered mental status, less responsive, blank stare-sudden change from baseline  History is obtained from: Chart review  HPI: [name] is a 69 y.o. female past medical history of diabetes, ESRD Monday Wednesday Friday hemodialysis, hyperlipidemia, hypertension, stroke with residual mild left hemiparesis per chart review, history of endocarditis of the mitral valve with septic emboli, right IJ DVT on apixaban, admitted back 2 days ago for evaluation of nausea vomiting and diarrhea.  She was unable to tolerate anything p.o.  She was found to be hyperkalemic with potassium 7.5 and nephrology was consulted for dialysis and was transferred to [facility]  She was also positive for SARS-CoV-2 with no respiratory symptoms and not requiring oxygen support.  Chest x-ray unremarkable. Today, sometime at 4 PM, she was noted to be less responsive with a blank stare while she was in dialysis. Waited but the primary hospitalist as well as rapid response team.  CBG was 120.  Oral suction was set up, ABG was attempted without success multiple times. CT head and EEG were ordered and a neurological consultation was obtained. Patient denies any headache. She remains lethargic but is able to answer all questions.  LKW: 4 PM today tpa given?: no, nonfocal examination as well as on apixaban Premorbid modified Rankin scale (mRS): 4-uses wheelchair at baseline  ROS: Unable to reliably obtain due to mental status  Past Medical History:  Diagnosis Date   Arthritis    hands   Constipation    Diabetes mellitus without complication (HCC)    Type II   ESRD (end stage renal disease) ([place])     M/W/F- Hemodialysis   GERD (gastroesophageal reflux disease)    Headache    Hyperlipidemia    Hypertension    Irregular heart rate    Stroke (HCC)    TIA - approx  2010- no residual    Family History  Problem Relation Age of Onset   Heart murmur Mother    Heart failure Mother    Diabetes Mother    Cirrhosis Father    Alcoholism Father    Social History:   reports that she has never smoked. She has never used smokeless tobacco. She reports that she does not drink alcohol or use drugs.  Medications  Current Facility-Administered Medications:    acetaminophen (TYLENOL) tablet 650 mg, 650 mg, Oral, Q6H PRN **OR** [DISCONTINUED] acetaminophen (TYLENOL) suppository 650 mg, 650 mg, Rectal, Q6H PRN, Darrick Meigs, Marge Duncans, MD   albuterol (PROVENTIL) (2.5 MG/3ML) 0.083% nebulizer solution 10 mg, 10 mg, Nebulization, Once, Thurnell Lose, MD, Stopped at 04/23/20 1512   amLODipine (NORVASC) tablet 10 mg, 10 mg, Oral, Daily, Lala Lund K, MD, 10 mg at 04/23/20 1000   apixaban (ELIQUIS) tablet 5 mg, 5 mg, Oral, BID, Darrick Meigs, Gagan S, MD, 5 mg at 04/23/20 1000   calcium gluconate 1 g/ 50 mL sodium chloride IVPB, 1 g, Intravenous, Once, Lala Lund K, MD   carvedilol (COREG) tablet 12.5 mg, 12.5 mg, Oral, BID, Darrick Meigs, Gagan S, MD, 12.5 mg at 04/23/20 1000   Chlorhexidine Gluconate Cloth 2 % PADS 6 each, 6 each, Topical, Q0600, Oswald Hillock, MD, 6 each at 04/23/20 0520   Chlorhexidine Gluconate Cloth 2 % PADS 6 each, 6 each, Topical, Q0600, Roney Jaffe, MD, 6 each at 04/23/20 1155   insulin aspart (novoLOG) injection 10 Units, 10 Units, Intravenous, Once **AND** dextrose 50 %  solution 50 mL, 1 ampule, Intravenous, Once, Candiss Norse, Margaree Mackintosh, MD   heparin sodium (porcine) 1000 UNIT/ML injection, , , ,    hydrALAZINE (APRESOLINE) injection 10 mg, 10 mg, Intravenous, Q6H PRN, Candiss Norse, Margaree Mackintosh, MD   insulin aspart (novoLOG) injection 0-9 Units, 0-9 Units, Subcutaneous, TID WC, Candiss Norse, Margaree Mackintosh, MD   isosorbide mononitrate (IMDUR) 24 hr tablet 60 mg, 60 mg, Oral, Daily, Darrick Meigs, Gagan S, MD, 60 mg at 04/23/20 1000   metoCLOPramide (REGLAN) injection  10 mg, 10 mg, Intravenous, Q6H PRN, Darrick Meigs, Marge Duncans, MD   [DISCONTINUED] ondansetron (ZOFRAN) tablet 4 mg, 4 mg, Oral, Q6H PRN **OR** ondansetron (ZOFRAN) injection 4 mg, 4 mg, Intravenous, Q6H PRN, Darrick Meigs, Marge Duncans, MD   pantoprazole (PROTONIX) EC tablet 40 mg, 40 mg, Oral, Daily, Darrick Meigs, Marge Duncans, MD, 40 mg at 04/23/20 1000  Exam: Current vital signs: BP 118/86 (BP Location: Left Arm)    Pulse (!) 107    Temp 98.2 F (36.8 C) (Oral)    Resp 17    Ht 5\' 7"  (1.702 m)    Wt 60.9 kg    SpO2 96%    BMI 21.03 kg/m  Vital signs in last 24 hours: Temp:  [98 F (36.7 C)-99.4 F (37.4 C)] 98.2 F (36.8 C) (06/01 1928) Pulse Rate:  [79-107] 107 (06/01 1928) Resp:  [11-20] 17 (06/01 1928) BP: (95-158)/(62-96) 118/86 (06/01 1928) SpO2:  [96 %-100 %] 96 % (06/01 1928) Weight:  [60.9 kg] 60.9 kg (06/01 1445) General: Sleeping, awakens to voice and cooperates with exam, no distress HEENT: Cephalic atraumatic Lungs clear to auscultation Cardiovascular: Regular rhythm Abdomen nondistended nontender Extremities warm well perfused without edema Neurological exam Sleeping, awakens to voice, slow to respond to questions, appears a little lethargic. Speech is clear with no evidence of aphasia or dysarthria but has poor attention concentration. Cranial nerves: Pupils equal round react light, extra movements intact, visual fields full, face appears symmetric, articulatory appears intact conversational voice tongue and palate are midline. Motor exam: She is antigravity in all 4 extremities without any drift but left leg slightly weaker than the right. Sensory exam: Intact light touch Coordination: Difficult to assess but no obvious dysmetria NIH stroke scale-1 for LOC   Labs I have reviewed labs in epic and the results pertinent to this consultation are:  CBC    Component Value Date/Time   WBC 8.6 04/23/2020 0541   RBC 4.86 04/23/2020 0541   HGB 13.4 04/23/2020 0541   HCT 43.3 04/23/2020 0541   PLT  116 (L) 04/23/2020 0541   MCV 89.1 04/23/2020 0541   MCH 27.6 04/23/2020 0541   MCHC 30.9 04/23/2020 0541   RDW 14.1 04/23/2020 0541   LYMPHSABS 1.0 04/21/2020 2155   MONOABS 0.3 04/21/2020 2155   EOSABS 0.0 04/21/2020 2155   BASOSABS 0.0 04/21/2020 2155    CMP     Component Value Date/Time   NA 137 04/23/2020 0541   K 7.0 (HH) 04/23/2020 1222   CL 95 (L) 04/23/2020 0541   CO2 23 04/23/2020 0541   GLUCOSE 89 04/23/2020 0541   BUN 26 (H) 04/23/2020 0541   CREATININE 6.57 (H) 04/23/2020 0541   CALCIUM 9.5 04/23/2020 0541   PROT 8.5 (H) 04/21/2020 2155   ALBUMIN 4.0 04/21/2020 2155   AST 20 04/21/2020 2155   ALT 18 04/21/2020 2155   ALKPHOS 130 (H) 04/21/2020 2155   BILITOT 0.9 04/21/2020 2155   GFRNONAA 6 (L) 04/23/2020 0541   GFRAA 7 (L)  04/23/2020 0541   Imaging I have reviewed the images obtained:  CT-scan of the brain-no acute changes.  Chronic appearing lacunar infarcts on the right.  Chronic white matter changes.  Official reading pending at this time.  Assessment: 69 year old with above past medical history with sudden onset of decreased level of consciousness and a blank stare, no seizure activity noted. Appears more drowsy and lethargic but is able to answer all questions appropriately and is nonfocal on the exam otherwise. I suspect that her current symptoms are related to dialysis disequilibrium versus toxic metabolic encephalopathy in the setting of acute metabolic derangements as well as underlying SARS-CoV-2 infection. Given her history of strokes and also DVT, although she is anticoagulated, an MRI would be prudent to rule out stroke. Not a candidate for TPA due to being on apixaban and exam being nonfocal.  Not a candidate for thrombectomy due to no evidence of LVO on exam. CT head shows no evidence of bleed.  Impression: -Multifactorial toxic metabolic encephalopathy including  Dialysis disequilibrium syndrome -Evaluate for ischemic stroke -Less likely,  but also evaluate for seizure  Recommendations: Maintain seizure precautions, but  no antiepileptics for now Obtain EEG when able MRI of the brain without contrast when able Correction of toxic metabolic derangements per primary team as you are Check UA, B12, TSH, NH3, ABG All recommended tests have been ordered. Neurology will follow results and imaging studies  -- Amie Portland, MD [facility] Pager: [phone] If 7pm to 7am, please call on call as listed on AMION.

## 2020-04-23 NOTE — Significant Event (Addendum)
Rapid Response Event Note  Overview: Time Called: 1610 Arrival Time: 3845 Event Type: Neurologic While in HD pt became less responsive with a blank stare.   Initial Focused Assessment: Pt lying in bed, delayed in responding. Pt is able to follow commands. Left facial droop and mild left sided weakness assessed- per MD this is chronic from hx of CVA. Speech is clear, whispered. Pt is able to answer some orientation questions, she is oriented to person and place. Pt noted to have intermittent episode of apnea, supplemental oxygen applied. Lung sounds are clear, diminished. Abdomen is soft.   Interventions: -CBG 120 -3L [place] -Oral suction set-up -NPO  -Dr. Jonnie Finner updated via telephone -Dr. Candiss Norse at bedside to evaluate pt. -Per MD, continue dialysis treatment. CT head follow HD. EEG ordered. ABG attempted without success.   Plan of Care (if not transferred): -Follow-up with provider results of tests ordered -Frequent neuro exams -Wean supplemental oxygen to maintain oxygen satuation >92% -Aspiration precautions  Rn to call rapid response for additional needs  Event Summary: Name of Physician Notified: Dr. Candiss Norse at 1620 Outcome: Stayed in room and stabalized Event End Time: [place]

## 2020-04-23 NOTE — Progress Notes (Signed)
[facility] Progress Note  Subjective: K+ 6.9 this am, pt drowsy and disoriented, no c/o's.   Vitals:   04/23/20 0957 04/23/20 0959 04/23/20 1000 04/23/20 1215  BP: 121/84 121/84 116/86 (!) 147/92  Pulse: (!) 101 (!) 101 100 (!) 101  Resp: 11 19 15 20   Temp: 99.4 F (37.4 C) 99.4 F (37.4 C)  98.9 F (37.2 C)  TempSrc: Axillary Axillary  Oral  SpO2: 97% 96% 100% 100%  Weight:      Height:        Exam:   Drowsy, awakens, O x1, responsvie  no jvd  Chest cta bilat  Cor reg no RG  Abd soft ntnd no ascites   Ext no LE edema   Alert, NF, Ox 1  RIJ TDC      OP HD: [facility]     From Jan 2021 > 3.5h  TDC (multiple AVF attempts failed)  62.5kg  2/2.5 bath  400/600  Summary: 69 yo w/ hx DM, CVA w/ residual L sided weakness, ESRD on HD. Here in Jan 2021 w/ CVA/ endocarditis and deconditioning. Last HD Friday. Presented to [facility] w/ n/v/d and K+ 7.5, found to be COVID+. Transferred to [facility] for dialysis and has now completed HD this am (5/31).   Assessment/ Plan: 1 Hyperkalemia- refractory to HD yest. Is on non-renal diet. Plan HD again today w/ low K+ bath. Change to renal diet (low K diet).  2 ESRD: normally MWF. Extra HD as above today. May keep on TTS this wk d/t imbalanced schedule 3 HTN/ vol : no home BP meds noted on chart.  BP okay, CXR clear.  4. Anemia of ESRD: does not appear to be an issue at this time, no ESA required  5. MBD ckd: does not know home meds for this.  Eventually will obtain from her home unit  6. COVID pos-  In isolation.  No resp symptoms and no O2 req-  Per hosps 7 N/V/D- likely due to COVID- supportive care     Rob Luara Faye 04/23/2020, 12:40 PM   Recent Labs  Lab 04/21/20 2155 04/21/20 2155 04/22/20 0014 04/23/20 0541  K 7.5*   < > 5.9* 6.8*  BUN 42*  --   --  26*  CREATININE 8.55*  --   --  6.57*  CALCIUM 9.5  --   --  9.5  HGB 13.4  --   --  13.4   < > = values in this interval not displayed.   Inpatient medications: .  amLODipine  10 mg Oral Daily  . apixaban  5 mg Oral BID  . carvedilol  12.5 mg Oral BID  . Chlorhexidine Gluconate Cloth  6 each Topical Q0600  . Chlorhexidine Gluconate Cloth  6 each Topical Q0600  . insulin aspart  0-9 Units Subcutaneous TID WC  . isosorbide mononitrate  60 mg Oral Daily  . pantoprazole  40 mg Oral Daily    acetaminophen **OR** [DISCONTINUED] acetaminophen, hydrALAZINE, metoCLOPramide (REGLAN) injection, [DISCONTINUED] ondansetron **OR** ondansetron (ZOFRAN) IV

## 2020-04-23 NOTE — Progress Notes (Signed)
Renal Navigator notified patient's OP HD clinic/[facility] of her positive COVID 19 test result on 04/21/20 and has asked for an update on their plan for isolation treatment at discharge. Navigator faxed COVID 19 lab result to clinic. Navigator will follow closely.  Alphonzo Cruise, [place] [facility]al Navigator [phone]

## 2020-04-24 ENCOUNTER — Inpatient Hospital Stay (HOSPITAL_COMMUNITY): Payer: Medicare PPO

## 2020-04-24 DIAGNOSIS — E160 Drug-induced hypoglycemia without coma: Secondary | ICD-10-CM

## 2020-04-24 DIAGNOSIS — R4182 Altered mental status, unspecified: Secondary | ICD-10-CM

## 2020-04-24 DIAGNOSIS — T383X5A Adverse effect of insulin and oral hypoglycemic [antidiabetic] drugs, initial encounter: Secondary | ICD-10-CM

## 2020-04-24 LAB — CBC WITH DIFFERENTIAL/PLATELET
Abs Immature Granulocytes: 0.01 10*3/uL (ref 0.00–0.07)
Basophils Absolute: 0 10*3/uL (ref 0.0–0.1)
Basophils Relative: 1 %
Eosinophils Absolute: 0 10*3/uL (ref 0.0–0.5)
Eosinophils Relative: 1 %
HCT: 38.1 % (ref 36.0–46.0)
Hemoglobin: 12.1 g/dL (ref 12.0–15.0)
Immature Granulocytes: 0 %
Lymphocytes Relative: 38 %
Lymphs Abs: 1.9 10*3/uL (ref 0.7–4.0)
MCH: 27.9 pg (ref 26.0–34.0)
MCHC: 31.8 g/dL (ref 30.0–36.0)
MCV: 88 fL (ref 80.0–100.0)
Monocytes Absolute: 0.5 10*3/uL (ref 0.1–1.0)
Monocytes Relative: 9 %
Neutro Abs: 2.7 10*3/uL (ref 1.7–7.7)
Neutrophils Relative %: 51 %
Platelets: 104 10*3/uL — ABNORMAL LOW (ref 150–400)
RBC: 4.33 MIL/uL (ref 3.87–5.11)
RDW: 13.8 % (ref 11.5–15.5)
WBC: 5.1 10*3/uL (ref 4.0–10.5)
nRBC: 0 % (ref 0.0–0.2)

## 2020-04-24 LAB — COMPREHENSIVE METABOLIC PANEL
ALT: 12 U/L (ref 0–44)
AST: 20 U/L (ref 15–41)
Albumin: 3.2 g/dL — ABNORMAL LOW (ref 3.5–5.0)
Alkaline Phosphatase: 106 U/L (ref 38–126)
Anion gap: 13 (ref 5–15)
BUN: 21 mg/dL (ref 8–23)
CO2: 25 mmol/L (ref 22–32)
Calcium: 8.5 mg/dL — ABNORMAL LOW (ref 8.9–10.3)
Chloride: 98 mmol/L (ref 98–111)
Creatinine, Ser: 5.15 mg/dL — ABNORMAL HIGH (ref 0.44–1.00)
GFR calc Af Amer: 9 mL/min — ABNORMAL LOW (ref >60)
GFR calc non Af Amer: 8 mL/min — ABNORMAL LOW (ref >60)
Glucose, Bld: 130 mg/dL — ABNORMAL HIGH (ref 70–99)
Potassium: 3.9 mmol/L (ref 3.5–5.1)
Sodium: 136 mmol/L (ref 135–145)
Total Bilirubin: 0.6 mg/dL (ref 0.3–1.2)
Total Protein: 7 g/dL (ref 6.5–8.1)

## 2020-04-24 LAB — C-REACTIVE PROTEIN: CRP: 0.6 mg/dL (ref <1.0)

## 2020-04-24 LAB — GLUCOSE, CAPILLARY
Glucose-Capillary: 164 mg/dL — ABNORMAL HIGH (ref 70–99)
Glucose-Capillary: 223 mg/dL — ABNORMAL HIGH (ref 70–99)
Glucose-Capillary: 24 mg/dL — CL (ref 70–99)
Glucose-Capillary: 240 mg/dL — ABNORMAL HIGH (ref 70–99)
Glucose-Capillary: 262 mg/dL — ABNORMAL HIGH (ref 70–99)
Glucose-Capillary: 28 mg/dL — CL (ref 70–99)
Glucose-Capillary: 280 mg/dL — ABNORMAL HIGH (ref 70–99)
Glucose-Capillary: 384 mg/dL — ABNORMAL HIGH (ref 70–99)
Glucose-Capillary: 474 mg/dL — ABNORMAL HIGH (ref 70–99)
Glucose-Capillary: 85 mg/dL (ref 70–99)

## 2020-04-24 LAB — D-DIMER, QUANTITATIVE: D-Dimer, Quant: 0.51 ug/mL-FEU — ABNORMAL HIGH (ref 0.00–0.50)

## 2020-04-24 LAB — BRAIN NATRIURETIC PEPTIDE: B Natriuretic Peptide: 101.6 pg/mL — ABNORMAL HIGH (ref 0.0–100.0)

## 2020-04-24 LAB — CORTISOL: Cortisol, Plasma: 19.4 ug/dL

## 2020-04-24 MED ORDER — CHLORHEXIDINE GLUCONATE CLOTH 2 % EX PADS
6.0000 | MEDICATED_PAD | Freq: Every day | CUTANEOUS | Status: DC
  Administered 2020-04-25: 6 via TOPICAL

## 2020-04-24 MED ORDER — METHYLPREDNISOLONE SODIUM SUCC 125 MG IJ SOLR: 80.0000 mg | Freq: Once | INTRAMUSCULAR | Status: DC

## 2020-04-24 MED ORDER — DIVALPROEX SODIUM 250 MG PO DR TAB
250.0000 mg | DELAYED_RELEASE_TABLET | Freq: Four times a day (QID) | ORAL | Status: AC
  Administered 2020-04-24: 250 mg via ORAL
  Filled 2020-04-24: qty 1

## 2020-04-24 MED ORDER — DIVALPROEX SODIUM 250 MG PO DR TAB
250.0000 mg | DELAYED_RELEASE_TABLET | Freq: Three times a day (TID) | ORAL | Status: DC
  Administered 2020-04-25 – 2020-04-26 (×5): 250 mg via ORAL
  Filled 2020-04-24 (×5): qty 1

## 2020-04-24 MED ORDER — DEXTROSE 5 % IV SOLN: INTRAVENOUS | Status: DC

## 2020-04-24 MED ORDER — GLUCOSE 40 % PO GEL
ORAL | Status: AC
  Administered 2020-04-24: 37.5 g
  Filled 2020-04-24: qty 1

## 2020-04-24 MED ORDER — DEXTROSE 50 % IV SOLN
INTRAVENOUS | Status: AC
  Administered 2020-04-24: 50 mL
  Filled 2020-04-24: qty 50

## 2020-04-24 NOTE — Progress Notes (Signed)
Occupational Therapy Evaluation Patient Details Name: [name] MRN: [id number] DOB: Feb 09, 1951 Today's Date: 04/24/2020    History of Present Illness Pt adm with nausea, vomiting,and diarrhea and found to have Covid 19. PMH - ESRD on HD, rt CVA 09/2020, htn, endocarditis, DM   Clinical Impression   Patient lives at home with son and has sister available to help whenever needed.  She states she is typically independent with devices but son helps with IADLs.  She uses a walker for mobility around the home and a w/c for community mobility.  Today patient seemed foggy/dazed, though able to answer all questions.  She had difficulty following simple commands and required verbal and tactile cueing.  Patient needed min assist to stand and walk with RW and cueing for proper walker use.  Patient fatigued quickly and needed to rest after 39ft walk.  Will continue to follow with OT acutely to address the deficits listed below.      Follow Up Recommendations  Home health OT;Supervision/Assistance - 24 hour    Equipment Recommendations  None recommended by OT    Recommendations for Other Services       Precautions / Restrictions Precautions Precautions: Fall Restrictions Weight Bearing Restrictions: No      Mobility Bed Mobility Overal bed mobility: Needs Assistance Bed Mobility: Sidelying to Sit;Sit to Sidelying   Sidelying to sit: Min assist     Sit to sidelying: Supervision    Transfers Overall transfer level: Needs assistance Equipment used: Rolling walker (2 wheeled) Transfers: Sit to/from Stand Sit to Stand: Min assist         General transfer comment: Assist to bring hips up and for balance    Balance Overall balance assessment: Needs assistance Sitting-balance support: No upper extremity supported;Feet supported Sitting balance-Leahy Scale: Fair     Standing balance support: Bilateral upper extremity supported Standing balance-Leahy Scale: Poor Standing  balance comment: walker and min assist for static standing                           ADL either performed or assessed with clinical judgement   ADL Overall ADL's : Needs assistance/impaired Eating/Feeding: Set up;Sitting   Grooming: Minimal assistance;Standing   Upper Body Bathing: Minimal assistance;Sitting   Lower Body Bathing: Moderate assistance;Sit to/from stand   Upper Body Dressing : Minimal assistance;Sitting   Lower Body Dressing: Moderate assistance;Sit to/from stand   Toilet Transfer: Minimal assistance;Ambulation;RW           Functional mobility during ADLs: Minimal assistance;Rolling walker       Vision         Perception     Praxis      Pertinent Vitals/Pain Pain Assessment: No/denies pain     Hand Dominance Right   Extremity/Trunk Assessment Upper Extremity Assessment Upper Extremity Assessment: Generalized weakness           Communication Communication Communication: No difficulties   Cognition Arousal/Alertness: Awake/alert Behavior During Therapy: WFL for tasks assessed/performed Overall Cognitive Status: No family/caregiver present to determine baseline cognitive functioning Area of Impairment: Orientation;Attention;Memory;Following commands;Safety/judgement;Problem solving                 Orientation Level: Disoriented to;Time;Situation Current Attention Level: Sustained Memory: Decreased short-term memory Following Commands: Follows one step commands inconsistently Safety/Judgement: Decreased awareness of safety;Decreased awareness of deficits   Problem Solving: Decreased initiation;Slow processing;Difficulty sequencing;Requires tactile cues;Requires verbal cues General Comments: Able to answer questions about home and follow  some one step commands, though slow processing and looking very dazed   General Comments  RA and SpO2 at 90 during session    Exercises     Shoulder Instructions      Home Living  Family/patient expects to be discharged to:: Private residence Living Arrangements: Children Available Help at Discharge: Family;Available 24 hours/day Type of Home: House Home Access: Stairs to enter [employer] of Steps: 3 Entrance Stairs-Rails: Right;Left;Can reach both Home Layout: One level     Bathroom Shower/Tub: Occupational psychologist: Standard     Home Equipment: Clinical cytogeneticist - 2 wheels;Cane - single point;Wheelchair - manual   Additional Comments: Pt lives with son who works from [employer] lives next door but spends the night every night and will be available 24/7      Prior Functioning/Environment Level of Independence: Needs assistance  Gait / Transfers Assistance Needed: Uses w/c. Amb with rolling walker with assist ADL's / Homemaking Assistance Needed: Help with IADLs, states she completes ADLs independently (with devices)            OT Problem List: Decreased strength;Decreased activity tolerance;Impaired balance (sitting and/or standing);Decreased cognition;Decreased safety awareness;Decreased knowledge of use of DME or AE;Cardiopulmonary status limiting activity      OT Treatment/Interventions: Self-care/ADL training;Therapeutic exercise;Energy conservation;DME and/or AE instruction;Therapeutic activities;Cognitive remediation/compensation;Patient/family education;Balance training    OT Goals(Current goals can be found in the care plan section) Acute Rehab OT Goals Patient Stated Goal: to go home OT Goal Formulation: With patient Time For Goal Achievement: 05/08/20 Potential to Achieve Goals: Good  OT Frequency: Min 2X/week   Barriers to D/C:            Co-evaluation              AM-PAC OT "6 Clicks" Daily Activity     Outcome Measure Help from another person eating meals?: A Little Help from another person taking care of personal grooming?: A Little Help from another person toileting, which includes using toliet,  bedpan, or urinal?: A Little Help from another person bathing (including washing, rinsing, drying)?: A Little Help from another person to put on and taking off regular upper body clothing?: A Little Help from another person to put on and taking off regular lower body clothing?: A Little 6 Click Score: 18   End of Session Equipment Utilized During Treatment: Gait belt;Rolling walker Nurse Communication: Mobility status  Activity Tolerance: Patient tolerated treatment well;Patient limited by fatigue Patient left: in bed;with call bell/phone within reach;with bed alarm set  OT Visit Diagnosis: Unsteadiness on feet (R26.81);Muscle weakness (generalized) (M62.81);Other symptoms and signs involving cognitive function                Time: 1050-1113 OT Time Calculation (min): 23 min Charges:  OT General Charges $OT Visit: 1 Visit OT Evaluation $OT Eval Moderate Complexity: 1 Mod OT Treatments $Self Care/Home Management : 8-22 mins  August Luz, OTR/L   Phylliss Bob 04/24/2020, 1:32 PM

## 2020-04-24 NOTE — Progress Notes (Signed)
At approximately 1555 pt found to be confused and unresponsive to basic commands. Skin cool to touch and sweating profusely. CBG checked at 1558 resulted in critical low of 24. Dr. Waldron Labs notified. d50 ml ordered and given. Dr. Waldron Labs at bedside shortly afterward. D50 50ml given again at 1612 and again at 1615. Pt awake but still unable to speak or follow commands. Glutose15 oral glucose gel given simultaenously x1. External Jugular IV placed by Dr. Waldron Labs with order for D10 137mL bolus to be given. CBG checked again at 1622 - 474. D10 bolus rate changed to 49ml/hr. Pt becoming more alert and oriented. Able to answer basic questions.  1717: Pt now alert and oriented but cool to touch. Rectal temp taken resulted in 93.7. Bear Hugger placed on patient.

## 2020-04-24 NOTE — Progress Notes (Addendum)
[facility] Progress Note  Subjective: K+ down 3.9 this am. Had RR yest for dec'd MS.    Patient not examined today directly given COVID-19 + status, utilizing data taken from chart +/- discussions w/ providers and staff.    Vitals:   04/23/20 1928 04/24/20 0500 04/24/20 0510 04/24/20 1410  BP: 118/86  104/72 95/60  Pulse: (!) 107  87 80  Resp: 17  13 16   Temp: 98.2 F (36.8 C)  98.4 F (36.9 C) 98.2 F (36.8 C)  TempSrc: Oral  Oral Oral  SpO2: 96%  100% 95%  Weight:  60.3 kg    Height:        Exam:   Patient not examined today directly given COVID-19 + status, utilizing data taken from chart +/- discussions w/ providers and staff.     OP HD: [facility]     From Jan 2021 > 3.5h  TDC (multiple AVF attempts failed)  62.5kg  2/2.5 bath  400/600  Summary: 69 yo w/ hx DM, CVA w/ residual L sided weakness, ESRD on HD. Here in Jan 2021 w/ CVA/ endocarditis and deconditioning. Last HD Friday. Presented to [facility] w/ n/v/d and K+ 7.5, found to be COVID+. Transferred to [facility] for dialysis and has now completed HD this am (5/31).   Assessment/ Plan: 1 Hyperkalemia- was on non-renal diet. Changed to renal diet (low K diet). K+ better w/ extra HD yest and renal diet.  2. AMS - seen by neuro, suspected metabolic/ uremia, etc.   3. ESRD: normally MWF. Got extra HD yest for ^K+.  Will keep on TTS this wk d/t imbalanced schedule. HD tomorrow.  4. HTN/ vol : getting coreg and norvasc, BP's soft to low normal. Will hold BP lowering meds for now.  5. Anemia of ESRD: does not appear to be an issue at this time, no ESA required  6. MBD ckd: does not know home meds for this.  Eventually will obtain from her home unit  7. COVID pos-  In isolation.  No resp symptoms and no O2 req-  Per hosps 8. N/V/D- likely due to COVID- supportive care     Rob Eion Timbrook 04/24/2020, 3:41 PM   Recent Labs  Lab 04/23/20 0541 04/23/20 0541 04/23/20 1222 04/24/20 1131  K 6.8*   < > 7.0* 3.9  BUN 26*   --   --  21  CREATININE 6.57*  --   --  5.15*  CALCIUM 9.5  --   --  8.5*  HGB 13.4  --   --  12.1   < > = values in this interval not displayed.   Inpatient medications: . albuterol  10 mg Nebulization Once  . amLODipine  10 mg Oral Daily  . apixaban  5 mg Oral BID  . carvedilol  12.5 mg Oral BID  . Chlorhexidine Gluconate Cloth  6 each Topical Q0600  . Chlorhexidine Gluconate Cloth  6 each Topical Q0600  . insulin aspart  10 Units Intravenous Once   And  . dextrose  1 ampule Intravenous Once  . insulin aspart  0-9 Units Subcutaneous TID WC  . isosorbide mononitrate  60 mg Oral Daily  . pantoprazole  40 mg Oral Daily   . calcium gluconate    . cefTRIAXone (ROCEPHIN)  IV Stopped (04/24/20 0400)   acetaminophen **OR** [DISCONTINUED] acetaminophen, hydrALAZINE, metoCLOPramide (REGLAN) injection, [DISCONTINUED] ondansetron **OR** ondansetron (ZOFRAN) IV

## 2020-04-24 NOTE — Progress Notes (Signed)
SLP Cancellation Note  Patient Details Name: [name] MRN: [id number] DOB: 1951-02-11   Cancelled treatment:       Reason Eval/Treat Not Completed: SLP swallow eval ordered as part on concern for code stroke. Since that time, CT negative but MRI pending. Pt passed [facility] swallow screen and has tolerated diet. SLP screened, no needs identified, will sign off   Marea Reasner, Katherene Ponto 04/24/2020, 10:30 AM

## 2020-04-24 NOTE — Progress Notes (Addendum)
Physical Therapy Treatment Patient Details Name: [name] MRN: [id number] DOB: Feb 06, 1951 Today's Date: 04/24/2020    History of Present Illness 69 y.o. female admitted with N/V/D dx with COVID 19, severe hyperkalemia, mild hospital aquired delirium, EEG completed due to episodes of less responsiveness which showed L temporal region slowing.  Neurology following.  MRI and CT were negative for acute events.  Pt with significant PMH of stroke with L sided weakness, HTN, ESRD on HD, DM, s/p aortic arch angiography.      PT Comments    Pt with very different presentation than what is described in yesterday's PT note and in today's OT note.  Pt able to participate at min assist level and stand EOB with assist both yesterday and earlier today with OT.  This PM, PT came into room, pt moving restlessly around the bed, incontinent of urine, not speaking (grunting), vitals were all stable on RA.  She was unable to follow any commands and required total assist to get to EOB and back into bed.  I reported my concerns to RN, Jenny Reichmann and he reports the concerns are warranted, but with discussion with pt's son, he reports this is normal for her to have these "episodes" where she stares and becomes less lucid.  Neuro workup thus far is uneventful.   Family continues to be on board with taking her home.  PT will attempt in AM tomorrow to see if this makes a difference in her presentation.    Follow Up Recommendations  Home health PT;Supervision/Assistance - 24 hour(per RN family reports her presentation today is normal )     Equipment Recommendations  None recommended by PT    Recommendations for Other Services   NA     Precautions / Restrictions Precautions Precautions: Fall    Mobility  Bed Mobility Overal bed mobility: Needs Assistance Bed Mobility: Rolling;Supine to Sit;Sit to Supine Rolling: Total assist Sidelying to sit: Total assist   Sit to supine: Total assist   General bed mobility  comments: Pt sweaty, incontinent of urine, restless in the bed moving R UE and LE more briskly than L UE and LE, but still spontaneously moving all 4 extremities, just not to command.  total assist to roll for peri care, gown change and bed linen change.  Pt not participating in helping move.   Transfers                 General transfer comment: Unable to safely, limp and weak in supported sitting EOB.  Even with two person assist today I would not have attempted.  She stood yesterday with PT and earlier with OT.          Balance Overall balance assessment: Needs assistance Sitting-balance support: Feet supported;No upper extremity supported;Bilateral upper extremity supported Sitting balance-Leahy Scale: Zero Sitting balance - Comments: Pt is limp and sweaty EOB, unable to hold herself up, nearly folded in half like a rag doll.         Standing balance comment: Unable                            Cognition Arousal/Alertness: Awake/alert Behavior During Therapy: Restless;Impulsive;Flat affect Overall Cognitive Status: Impaired/Different from baseline Area of Impairment: Orientation;Attention;Memory;Following commands;Safety/judgement;Awareness;Problem solving                   Current Attention Level: Focused Memory: Decreased short-term memory Following Commands: (did not follow any commands for me)  Safety/Judgement: Decreased awareness of deficits;Decreased awareness of safety Awareness: Intellectual Problem Solving: Slow processing;Difficulty sequencing;Requires verbal cues;Requires tactile cues General Comments: Pt not speaking at all during my session, only moaning, restless in the bed, nearly flipping herself over the bed rail and moving down to the base.  I spoke with RN and said what a significant change this was from her last session, VSS and he confirmed that this information has been relayed and that family reports that she "gets like this" and then  comes back around later.  CT and MRI were clear for acute events.          General Comments General comments (skin integrity, edema, etc.): VSS on RA throughout BPs in 110s/70s, HR 70s, O2 sats 100%      Pertinent Vitals/Pain Pain Assessment: Faces Faces Pain Scale: No hurt           PT Goals (current goals can now be found in the care plan section) Acute Rehab PT Goals Patient Stated Goal: to go home Progress towards PT goals: Not progressing toward goals - comment(very weak this PM)    Frequency    Min 3X/week      PT Plan Current plan remains appropriate       AM-PAC PT "6 Clicks" Mobility   Outcome Measure  Help needed turning from your back to your side while in a flat bed without using bedrails?: Total Help needed moving from lying on your back to sitting on the side of a flat bed without using bedrails?: Total Help needed moving to and from a bed to a chair (including a wheelchair)?: Total Help needed standing up from a chair using your arms (e.g., wheelchair or bedside chair)?: Total Help needed to walk in hospital room?: Total Help needed climbing 3-5 steps with a railing? : Total 6 Click Score: 6    End of Session   Activity Tolerance: Patient limited by fatigue Patient left: in bed;with bed alarm set;with call bell/phone within reach Nurse Communication: Mobility status;Other (comment)(change in presentation vs previous session) PT Visit Diagnosis: Other abnormalities of gait and mobility (R26.89);Muscle weakness (generalized) (M62.81)     Time: 0272-5366 PT Time Calculation (min) (ACUTE ONLY): 22 min  Charges:  $Therapeutic Activity: 8-22 mins                    Verdene Lennert, PT, DPT  Acute Rehabilitation [phone] pager #[phone] office     04/24/2020, 3:58 PM

## 2020-04-24 NOTE — Procedures (Addendum)
Patient Name: [name]  MRN: [id number]  Epilepsy Attending: Lora Havens  Referring Physician/Provider: Dr. Lala Lund Date: 04/24/2020 Duration: 25.59 minutes  Patient history: 69 year old female presented with altered mental status.  EEG evaluate for seizures.  Level of alertness: Awake, asleep  AEDs during EEG study: None  Technical aspects: This EEG study was done with scalp electrodes positioned according to the 10-20 International system of electrode placement. Electrical activity was acquired at a sampling rate of 500Hz  and reviewed with a high frequency filter of 70Hz  and a low frequency filter of 1Hz . EEG data were recorded continuously and digitally stored.   Description: T no clear posterior dominant rhythm was seen.  Sleep was characterized by vertex waves, sleep spindles (12 to 14 Hz), maximal frontocentral region.  EEG showed intermittent generalized and maximal left temporal region 3 to 5 Hz theta-delta slowing.  Hyperventilation and photic stimulation were not performed.     ABNORMALITY -Intermittent slow, generalized and maximal left temporal region  IMPRESSION: This study is suggestive of nonspecific cortical dysfunction in left temporal region as well as moderate diffuse encephalopathy.  No seizures or epileptiform discharges were seen throughout the recording.  Dariann Huckaba [name]

## 2020-04-24 NOTE — Progress Notes (Signed)
PROGRESS NOTE                                                                                                                                                                                                             Patient Demographics:    Nanie Dunkleberger, is a 69 y.o. female, DOB - Oct 11, 1951, CLE:[id number]  Outpatient Primary MD for the patient is Sasser, Silvestre Moment, MD    LOS - 2  Admit date - 04/21/2020    Chief Complaint  Patient presents with  . Emesis       Brief Narrative - 69 y.o. female, with medical history of hypertension, endocarditis of mitral valve with septic embolism, ESRD on hemodialysis on MWF schedule and last dialysis was 2 days prior to ER visit, type 2 diabetes mellitus, right IJ DVT on apixaban, CVA with residual left leg weakness currently in wheelchair bound and lives at home with her son, presented to the [facility] ER with nausea vomiting diarrhea, was diagnosed with COVID-19 pneumonia, severe hypokalemia and was transferred to [facility] for urgent dialysis.   Subjective:   Patient in bed, is comfortable, she denies any complaints, the afternoon patient had an episode she was totally unresponsive, I open, lasted for a minute to minute and a half, where she was slightly confused after that her mentation was back to baseline , during that episode patient had CBG at 262. -Later in the afternoon patient had a hypoglycemic episode where she was altered(this was different than episode earlier in the day), poor mentation went back to baseline after receiving D50.  Assessment  & Plan :    Acute COVID-19 infection in a patient who has been vaccinated  -Concurrent family she did receive a DuoNeb vaccine x2 in March. -Appears to be asymptomatic, no pulmonary symptoms, chest x-ray clear, minimal nausea vomiting and diarrhea likely due to COVID-19 gastroenteritis which seems to have improved already.  Will  monitor closely including her inflammatory markers.  Encouraged the patient to sit up in chair in the daytime use I-S and flutter valve for pulmonary toiletry and then prone in bed when at night.  Will advance activity and titrate down oxygen as possible.  SpO2: 95 % O2 Flow Rate (L/min): 3 L/min  Recent Labs  Lab 04/21/20 2136 04/23/20 0541 04/24/20 1131  CRP  --  0.6 0.6  DDIMER  --   --  0.51*  BNP  --  220.1* 101.6*  SARSCOV2NAA POSITIVE*  --   --     Hepatic Function Latest Ref Rng & Units 04/24/2020 04/21/2020 12/04/2019  Total Protein 6.5 - 8.1 g/dL 7.0 8.5(H) -  Albumin 3.5 - 5.0 g/dL 3.2(L) 4.0 1.9(L)  AST 15 - 41 U/L 20 20 -  ALT 0 - 44 U/L 12 18 -  Alk Phosphatase 38 - 126 U/L 106 130(H) -  Total Bilirubin 0.3 - 1.2 mg/dL 0.6 0.9 -    Severe hyperkalemia in a patient with ESRD on MWF  -Pneumonia on diet, she is currently back to renal diet, as well has been adjusted with hemodialysis,.  Acute metabolic encephalopathy -Patient had an episode of encephalopathic, please see discussion below under seizures and risk of hypoglycemia. -As well patient with some mild hospital-acquired delirium.   Seizures -Patient has been having transient episodes of during, where she is unresponsive and confused, her EEG was with no evidence of seizure activity, but as discussed with Dr. Cheral Marker, this appears to be highly suspicious for seizures, so recommendation was to start patient on Dilantin .  This mellitus, type II with hyperglycemia -Her A1c is 6.9, she was on low-dose insulin sliding scale, her diabetes mellitus appears to be very brittle, where she was 262 , but in the afternoon it was as low as 24, where she was significantly encephalopathic, she was treated with D50 x3, and D10 drip. -I will discontinue her sliding scale, and would rather have her to run on the hyperglycemic side and hypoglycemic side  Essential hypertension.  Home medications continued Norvasc added for better  control.  History of mitral valve endocarditis with septic embolism.  Supportive care.    History of right IJ DVT.  On Eliquis continue.    CVA with minimal left-sided weakness.  Supportive care.     Lab Results  Component Value Date   HGBA1C 6.9 (H) 04/23/2020   CBG (last 3)  Recent Labs    04/24/20 1622 04/24/20 1632 04/24/20 1708  GLUCAP 474* 240* 223*     Condition - Fair  Family Communication  :  Son [name] [phone] on 04/22/20, 04/23/20   Code Status :  Full  Consults  :  Renal  Procedures  :  HD  PUD Prophylaxis : PPI  Disposition Plan  :    Status is: Inpatient  Remains inpatient appropriate because:Unsafe d/c plan   Dispo: The patient is from: Home              Anticipated d/c is to: Home              Anticipated d/c date is: 1-2 days              Patient currently is not medically stable to d/c.  ESRD, with significant electrolyte abnormalities including hyperkalemia, and severe hypoglycemia, will need further adjustment and close monitoring .  DVT Prophylaxis  :  Heparin    Lab Results  Component Value Date   PLT 104 (L) 04/24/2020    Diet :  Diet Order            Diet renal with fluid restriction Fluid restriction: 1200 mL Fluid; Room service appropriate? Yes; Fluid consistency: Thin  Diet effective now               Inpatient Medications  Scheduled Meds: . albuterol  10 mg Nebulization Once  .  apixaban  5 mg Oral BID  . Chlorhexidine Gluconate Cloth  6 each Topical Q0600  . Chlorhexidine Gluconate Cloth  6 each Topical Q0600  . [START ON 04/25/2020] Chlorhexidine Gluconate Cloth  6 each Topical Q0600  . dextrose      . dextrose      . insulin aspart  10 Units Intravenous Once   And  . dextrose  1 ampule Intravenous Once  . dextrose      . dextrose      . insulin aspart  0-9 Units Subcutaneous TID WC  . isosorbide mononitrate  60 mg Oral Daily  . pantoprazole  40 mg Oral Daily   Continuous Infusions: . calcium gluconate      . cefTRIAXone (ROCEPHIN)  IV Stopped (04/24/20 0400)   PRN Meds:.acetaminophen **OR** [DISCONTINUED] acetaminophen, hydrALAZINE, metoCLOPramide (REGLAN) injection, [DISCONTINUED] ondansetron **OR** ondansetron (ZOFRAN) IV  Antibiotics  :    Anti-infectives (From admission, onward)   Start     Dose/Rate Route Frequency Ordered Stop   04/23/20 2345  cefTRIAXone (ROCEPHIN) 1 g in sodium chloride 0.9 % 100 mL IVPB     1 g 200 mL/hr over 30 Minutes Intravenous Every 24 hours 04/23/20 2333         Time Spent in minutes  63   Phillips Climes M.D on 04/24/2020 at 5:11 PM   [facility] -  Office  [phone]     See all Orders from today for further details    Objective:   Vitals:   04/23/20 1928 04/24/20 0500 04/24/20 0510 04/24/20 1410  BP: 118/86  104/72 95/60  Pulse: (!) 107  87 80  Resp: _0 Temp: 98.2 F (36.8 C)  98.4 F (36.9 C) 98.2 F (36.8 C)  TempSrc: Oral  Oral Oral  SpO2: 96%  100% 95%  Weight:  60.3 kg    Height:        Wt Readings from Last 3 Encounters:  04/24/20 60.3 kg  02/01/20 65.8 kg  01/24/20 65.8 kg     Intake/Output Summary (Last 24 hours) at 04/24/2020 1711 Last data filed at 04/24/2020 1100 Gross per 24 hour  Intake 340 ml  Output -416 ml  Net 756 ml     Physical Exam Patient this morning was awake alert x3, pleasant, and appropriate (but later in the day she had some episode of significant encephalopathic, please see discussion above ) symmetrical Chest wall movement, Good air movement bilaterally, CTAB RRR,No Gallops,Rubs or new Murmurs, No Parasternal Heave +ve B.Sounds, Abd Soft, No tenderness, No rebound - guarding or rigidity. No Cyanosis, Clubbing or edema, No new Rash or bruise      Data Review:    CBC Recent Labs  Lab 04/21/20 2155 04/23/20 0541 04/24/20 1131  WBC 9.9 8.6 5.1  HGB 13.4 13.4 12.1  HCT 43.5 43.3 38.1  PLT 170 116* 104*  MCV 89.3 89.1 88.0  MCH 27.5 27.6 27.9  MCHC 30.8 30.9 31.8   RDW 14.1 14.1 13.8  LYMPHSABS 1.0  --  1.9  MONOABS 0.3  --  0.5  EOSABS 0.0  --  0.0  BASOSABS 0.0  --  0.0    Chemistries  Recent Labs  Lab 04/21/20 2155 04/22/20 0014 04/23/20 0541 04/23/20 1222 04/23/20 2129 04/24/20 1131  NA 139  --  137  --   --  136  K 7.5* 5.9* 6.8* 7.0*  --  3.9  CL 99  --  95*  --   --  98  CO2 25  --  23  --   --  25  GLUCOSE 204*  --  89  --   --  130*  BUN 42*  --  26*  --   --  21  CREATININE 8.55*  --  6.57*  --   --  5.15*  CALCIUM 9.5  --  9.5  --   --  8.5*  AST 20  --   --   --   --  20  ALT 18  --   --   --   --  12  ALKPHOS 130*  --   --   --   --  106  BILITOT 0.9  --   --   --   --  0.6  MG  --   --  1.9  --   --   --   AMMONIA  --   --   --   --  32  --   TSH  --   --   --   --  0.736  --   HGBA1C  --   --  6.9*  --   --   --      ------------------------------------------------------------------------------------------------------------------ No results for input(s): CHOL, HDL, LDLCALC, TRIG, CHOLHDL, LDLDIRECT in the last 72 hours.  Lab Results  Component Value Date   HGBA1C 6.9 (H) 04/23/2020   ------------------------------------------------------------------------------------------------------------------ Recent Labs    04/23/20 2129  TSH 0.736    Cardiac Enzymes No results for input(s): CKMB, TROPONINI, MYOGLOBIN in the last 168 hours.  Invalid input(s): CK ------------------------------------------------------------------------------------------------------------------    Component Value Date/Time   BNP 101.6 (H) 04/24/2020 1131    Micro Results Recent Results (from the past 240 hour(s))  SARS Coronavirus 2 by RT PCR (hospital order, performed in [facility] hospital lab) Nasopharyngeal Nasopharyngeal Swab     Status: Abnormal   Collection Time: 04/21/20  9:36 PM   Specimen: Nasopharyngeal Swab  Result Value Ref Range Status   SARS Coronavirus 2 POSITIVE (A) NEGATIVE Final    Comment: RESULT CALLED TO,  READ BACK BY AND VERIFIED WITH: POINTDEXTER,M AT 2249 ON 04/21/2020 BY MOSLEY,J (NOTE) SARS-CoV-2 target nucleic acids are DETECTED SARS-CoV-2 RNA is generally detectable in upper respiratory specimens  during the acute phase of infection.  Positive results are indicative  of the presence of the identified virus, but do not rule out bacterial infection or co-infection with other pathogens not detected by the test.  Clinical correlation with patient history and  other diagnostic information is necessary to determine patient infection status.  The expected result is negative. Fact Sheet for Patients:   StrictlyIdeas.no  Fact Sheet for Healthcare Providers:   BankingDealers.co.za   This test is not yet approved or cleared by the [place] FDA and  has been authorized for detection and/or diagnosis of SARS-CoV-2 by FDA under an Emergency Use Authorization (EUA).  This EUA will remain in effect (meaning thi s test can be used) for the duration of  the COVID-19 declaration under Section 564(b)(1) of the Act, 21 U.S.C. section 360-bbb-3(b)(1), unless the authorization is terminated or revoked sooner. Performed at [facility], [address], [place], [place] [address]     Radiology Reports CT HEAD WO CONTRAST  Result Date: 04/23/2020 CLINICAL DATA:  Encephalopathy. EXAM: CT HEAD WITHOUT CONTRAST TECHNIQUE: Contiguous axial images were obtained from the base of the skull through the vertex without intravenous contrast. COMPARISON:  Brain MR 11/14/2019.  03/07/2020 from  [facility]. FINDINGS: Brain: Cerebral atrophy. Mild low density in the periventricular white matter likely related to small vessel disease. No mass lesion, hemorrhage, hydrocephalus, acute infarct, intra-axial, or extra-axial fluid collection. Vascular: Intracranial atherosclerosis. Skull: Normal skull. Sinuses/Orbits: Normal imaged portions of the orbits and globes. Clear  paranasal sinuses and mastoid air cells. Other: None. IMPRESSION: 1. No acute intracranial abnormality. 2. Cerebral atrophy and small vessel ischemic change. Electronically Signed   By: Abigail Miyamoto M.D.   On: 04/23/2020 20:39   MR BRAIN WO CONTRAST  Result Date: 04/24/2020 CLINICAL DATA:  Episode of decreased responsiveness EXAM: MRI HEAD WITHOUT CONTRAST TECHNIQUE: Multiplanar, multiecho pulse sequences of the brain and surrounding structures were obtained without intravenous contrast. COMPARISON:  11/14/2019 FINDINGS: Motion artifact is present. Brain: There is no acute infarction or intracranial hemorrhage. Chronic infarcts of the corona radiata and centrum semiovale. Unclear why diffusion hyperintensity versus several months later but extent is unchanged. There is wallerian degeneration along the cerebral peduncles. Additional chronic infarcts of left thalamus and cerebellum. Other patchy T2 hyperintensity in the supratentorial white matter likely reflects stable chronic microvascular ischemic changes. Ventricles are stable in size. There is no intracranial mass, mass effect, or edema. There is no hydrocephalus or extra-axial fluid collection. Minimal focus of susceptibility the identified with chronic microhemorrhage or mineralization. Vascular: Major vessel flow voids at the skull base are preserved. Skull and upper cervical spine: Normal marrow signal is preserved. Sinuses/Orbits: Paranasal sinuses are aerated. Orbits are unremarkable. Other: Sella is unremarkable.  Mastoid air cells are clear. IMPRESSION: No acute infarction, hemorrhage, or mass. Chronic infarcts detailed above. Electronically Signed   By: Macy Mis M.D.   On: 04/24/2020 14:32   DG Chest Port 1 View  Result Date: 04/21/2020 CLINICAL DATA:  Cough, wheezing, diarrhea. EXAM: PORTABLE CHEST 1 VIEW COMPARISON:  01/24/2020 FINDINGS: Right-sided dialysis catheter in place. The heart is normal in size. Unchanged mediastinal contours.  Aortic atherosclerosis. No pulmonary edema. No focal airspace disease. No pleural fluid or pneumothorax. No acute osseous abnormalities are seen. IMPRESSION: No acute chest findings. Aortic Atherosclerosis (ICD10-I70.0). Electronically Signed   By: Keith Rake M.D.   On: 04/21/2020 22:27   EEG adult  Result Date: 04/24/2020 Lora Havens, MD     04/24/2020  9:53 AM Patient Name: Arlette Schaad MRN: [id number] Epilepsy Attending: Lora Havens Referring Physician/Provider: Dr. Lala Lund Date: 04/24/2020 Duration: 25.59 minutes Patient history: 69 year old female presented with altered mental status.  EEG evaluate for seizures. Level of alertness: Awake, asleep AEDs during EEG study: None Technical aspects: This EEG study was done with scalp electrodes positioned according to the 10-20 International system of electrode placement. Electrical activity was acquired at a sampling rate of _0  and reviewed with a high frequency filter of _1  and a low frequency filter of _2 . EEG data were recorded continuously and digitally stored. Description: T no clear posterior dominant rhythm was seen.  Sleep was characterized by vertex waves, sleep spindles (12 to 14 Hz), maximal frontocentral region.  EEG showed intermittent generalized and maximal left temporal region 3 to 5 Hz theta-delta slowing.  Hyperventilation and photic stimulation were not performed.   ABNORMALITY -Intermittent slow, generalized and maximal left temporal region IMPRESSION: This study is suggestive of nonspecific cortical dysfunction in left temporal region as well as moderate diffuse encephalopathy.  No seizures or epileptiform discharges were seen throughout the recording. Priyanka [name]

## 2020-04-24 NOTE — Progress Notes (Signed)
STAT EEG complete - results pending. ? ?

## 2020-04-24 NOTE — TOC Initial Note (Signed)
Transition of Care [facility]) - Initial/Assessment Note    Patient Details  Name: [name] MRN: [id number] Date of Birth: 1951-07-09  [facility] CM/SW Contact:    Maryclare Labrador, RN Phone Number: 04/24/2020, 9:01 AM  Clinical Narrative:     Pt confirms she is independent from home with her sister and her son.  Pt informed CM that she will have 24/7 supervision as recommended at discharge.  Pt confirms she has a PCP and denied barriers with paying for medications.   Pt is ESRD on outpt HD - pt stated her son will continue to transport her to and from HD.  Pt politely declined [facility] as recommended.  CM informed pt that her PCP can also arrange [facility] if she deems it beneficial post discharge.                Expected Discharge Plan: Home/Self Care     Patient Goals and CMS Choice        Expected Discharge Plan and Services Expected Discharge Plan: Home/Self Care       Living arrangements for the past 2 months: Single Family Home                                      Prior Living Arrangements/Services Living arrangements for the past 2 months: Single Family Home Lives with:: Siblings, Adult Children Patient language and need for interpreter reviewed:: Yes Do you feel safe going back to the place where you live?: Yes      Need for Family Participation in Patient Care: Yes (Comment) Care giver support system in place?: Yes (comment)   Criminal Activity/Legal Involvement Pertinent to Current Situation/Hospitalization: No - Comment as needed  Activities of Daily Living Home Assistive Devices/Equipment: Wheelchair ADL Screening (condition at time of admission) Patient's cognitive ability adequate to safely complete daily activities?: Yes Is the patient deaf or have difficulty hearing?: No Does the patient have difficulty seeing, even when wearing glasses/contacts?: No Does the patient have difficulty concentrating, remembering, or making decisions?:  Yes Patient able to express need for assistance with ADLs?: Yes Does the patient have difficulty dressing or bathing?: Yes Independently performs ADLs?: No Communication: Independent Dressing (OT): Needs assistance Is this a change from baseline?: Pre-admission baseline Grooming: Needs assistance Is this a change from baseline?: Pre-admission baseline Feeding: Independent Bathing: Needs assistance Is this a change from baseline?: Pre-admission baseline In/Out Bed: Needs assistance Is this a change from baseline?: Pre-admission baseline Walks in Home: Needs assistance Is this a change from baseline?: Pre-admission baseline Does the patient have difficulty walking or climbing stairs?: Yes Weakness of Legs: Both Weakness of Arms/Hands: Left  Permission Sought/Granted                  Emotional Assessment   Attitude/Demeanor/Rapport: Self-Confident, Engaged Affect (typically observed): Calm Orientation: : Oriented to Self, Oriented to Place, Oriented to  Time, Oriented to Situation   Psych Involvement: No (comment)  Admission diagnosis:  Hyperkalemia [E87.5] Nausea vomiting and diarrhea [R11.2, R19.7] COVID-19 [U07.1] Patient Active Problem List   Diagnosis Date Noted  . History of bacterial endocarditis 03/21/2020  . History of embolic stroke 03/70/4888  . ESRD (end stage renal disease) on dialysis ([place]) 03/21/2020  . Mitral valve regurgitation due to infection 01/24/2020  . Acute deep vein thrombosis (DVT) of non-extremity vein   . Greater trochanteric bursitis of left hip   .  Subtherapeutic international normalized ratio (INR)   . Pneumatosis coli   . Ogilvie's syndrome   . Decubitus ulcer of sacral region, unstageable ([place])   . Hemodialysis-associated hypotension   . Supratherapeutic INR   . Gastroesophageal reflux disease   . Encephalopathy, hepatic ([place])   . Hyperammonemia ([place])   . Sleep disturbance   . Candidiasis   . Endocarditis of mitral valve   .  Septic embolism ([place])   . Seizure ([place])   . Slow transit constipation   . Acute on chronic anemia   . Labile blood pressure   . Labile blood glucose   . Diabetes mellitus type 2 in nonobese (HCC)   . Bacteremia   . Cerebrovascular accident (CVA) of right basal ganglia ([place]) 11/01/2019  . Acute bacterial endocarditis   . Acute left-sided weakness   . Acute ischemic stroke ([place]) 10/20/2019  . Hyperkalemia 09/10/2019  . End-stage renal disease on hemodialysis ([place])   . Nausea vomiting and diarrhea   . Bacteremia due to Pseudomonas 07/20/2019  . ESRD needing dialysis ([place]) 05/15/2019  . Anemia of chronic renal failure 04/03/2018  . Essential hypertension 04/03/2018  . Type 2 diabetes mellitus with diabetic nephropathy ([place]) 04/03/2018   PCP:  Manon Hilding, MD Pharmacy:   [facility], [place] [address] Phone: [phone] Fax: [phone]     Social Determinants of Health (SDOH) Interventions    Readmission Risk Interventions Readmission Risk Prevention Plan 09/12/2019 09/11/2019 07/25/2019  Transportation Screening - Complete -  PCP or Specialist Appt within 3-5 Days - Complete Not Complete  Not Complete comments - - Message left for patient PCP office requesting follow up appointment. Return call not received.  [place] or Home Care Consult Not Complete Complete -  HRI or Home Care Consult comments patient declined - -  Social Work Consult for [place] Planning/Counseling - Complete -  Palliative Care Screening - Not Applicable -  Medication Review (RN Care Manager) - Complete -  Some recent data might be hidden

## 2020-04-25 ENCOUNTER — Ambulatory Visit: Payer: Medicare PPO | Admitting: Adult Health

## 2020-04-25 ENCOUNTER — Telehealth: Payer: Self-pay | Admitting: *Deleted

## 2020-04-25 DIAGNOSIS — R569 Unspecified convulsions: Secondary | ICD-10-CM

## 2020-04-25 LAB — CBC WITH DIFFERENTIAL/PLATELET
Abs Immature Granulocytes: 0.01 10*3/uL (ref 0.00–0.07)
Basophils Absolute: 0 10*3/uL (ref 0.0–0.1)
Basophils Relative: 1 %
Eosinophils Absolute: 0 10*3/uL (ref 0.0–0.5)
Eosinophils Relative: 1 %
HCT: 39.6 % (ref 36.0–46.0)
Hemoglobin: 12.6 g/dL (ref 12.0–15.0)
Immature Granulocytes: 0 %
Lymphocytes Relative: 39 %
Lymphs Abs: 2.4 10*3/uL (ref 0.7–4.0)
MCH: 27.8 pg (ref 26.0–34.0)
MCHC: 31.8 g/dL (ref 30.0–36.0)
MCV: 87.2 fL (ref 80.0–100.0)
Monocytes Absolute: 0.7 10*3/uL (ref 0.1–1.0)
Monocytes Relative: 10 %
Neutro Abs: 3.1 10*3/uL (ref 1.7–7.7)
Neutrophils Relative %: 49 %
Platelets: 98 10*3/uL — ABNORMAL LOW (ref 150–400)
RBC: 4.54 MIL/uL (ref 3.87–5.11)
RDW: 13.8 % (ref 11.5–15.5)
WBC: 6.2 10*3/uL (ref 4.0–10.5)
nRBC: 0 % (ref 0.0–0.2)

## 2020-04-25 LAB — COMPREHENSIVE METABOLIC PANEL
ALT: 14 U/L (ref 0–44)
AST: 21 U/L (ref 15–41)
Albumin: 3.1 g/dL — ABNORMAL LOW (ref 3.5–5.0)
Alkaline Phosphatase: 97 U/L (ref 38–126)
Anion gap: 15 (ref 5–15)
BUN: 33 mg/dL — ABNORMAL HIGH (ref 8–23)
CO2: 23 mmol/L (ref 22–32)
Calcium: 8.3 mg/dL — ABNORMAL LOW (ref 8.9–10.3)
Chloride: 98 mmol/L (ref 98–111)
Creatinine, Ser: 6.71 mg/dL — ABNORMAL HIGH (ref 0.44–1.00)
GFR calc Af Amer: 7 mL/min — ABNORMAL LOW (ref >60)
GFR calc non Af Amer: 6 mL/min — ABNORMAL LOW (ref >60)
Glucose, Bld: 86 mg/dL (ref 70–99)
Potassium: 4.1 mmol/L (ref 3.5–5.1)
Sodium: 136 mmol/L (ref 135–145)
Total Bilirubin: 0.6 mg/dL (ref 0.3–1.2)
Total Protein: 6.9 g/dL (ref 6.5–8.1)

## 2020-04-25 LAB — GLUCOSE, CAPILLARY
Glucose-Capillary: 115 mg/dL — ABNORMAL HIGH (ref 70–99)
Glucose-Capillary: 109 mg/dL — ABNORMAL HIGH (ref 70–99)
Glucose-Capillary: 92 mg/dL (ref 70–99)
Glucose-Capillary: 85 mg/dL (ref 70–99)
Glucose-Capillary: 95 mg/dL (ref 70–99)
Glucose-Capillary: 181 mg/dL — ABNORMAL HIGH (ref 70–99)
Glucose-Capillary: 235 mg/dL — ABNORMAL HIGH (ref 70–99)
Glucose-Capillary: 169 mg/dL — ABNORMAL HIGH (ref 70–99)
Glucose-Capillary: 141 mg/dL — ABNORMAL HIGH (ref 70–99)

## 2020-04-25 LAB — BRAIN NATRIURETIC PEPTIDE: B Natriuretic Peptide: 136.3 pg/mL — ABNORMAL HIGH (ref 0.0–100.0)

## 2020-04-25 LAB — CBC
HCT: 39.5 % (ref 36.0–46.0)
Hemoglobin: 12.5 g/dL (ref 12.0–15.0)
MCH: 27.7 pg (ref 26.0–34.0)
MCHC: 31.6 g/dL (ref 30.0–36.0)
MCV: 87.6 fL (ref 80.0–100.0)
Platelets: 102 10*3/uL — ABNORMAL LOW (ref 150–400)
RBC: 4.51 MIL/uL (ref 3.87–5.11)
RDW: 13.6 % (ref 11.5–15.5)
WBC: 6.3 10*3/uL (ref 4.0–10.5)
nRBC: 0 % (ref 0.0–0.2)

## 2020-04-25 LAB — HEPATITIS B SURFACE ANTIGEN: Hepatitis B Surface Ag: NONREACTIVE

## 2020-04-25 LAB — D-DIMER, QUANTITATIVE: D-Dimer, Quant: 0.47 ug/mL-FEU (ref 0.00–0.50)

## 2020-04-25 LAB — C-REACTIVE PROTEIN: CRP: 0.5 mg/dL (ref <1.0)

## 2020-04-25 LAB — PHOSPHORUS: Phosphorus: 5.3 mg/dL — ABNORMAL HIGH (ref 2.5–4.6)

## 2020-04-25 MED ORDER — HEPARIN SODIUM (PORCINE) 1000 UNIT/ML IJ SOLN: 1000.0000 [IU] | INTRAMUSCULAR | Status: DC

## 2020-04-25 MED ORDER — CHLORHEXIDINE GLUCONATE CLOTH 2 % EX PADS: 6.0000 | MEDICATED_PAD | Freq: Every day | CUTANEOUS | Status: DC

## 2020-04-25 MED ORDER — HEPARIN SODIUM (PORCINE) 1000 UNIT/ML IJ SOLN
INTRAMUSCULAR | Status: AC
  Administered 2020-04-25: 3200 [IU] via INTRAVENOUS
  Filled 2020-04-25: qty 4
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
## 2020-04-25 NOTE — Progress Notes (Signed)
[facility] Progress Note  Subjective: looks remarkably better today. EEG showed active seizures L side. Started on depakote 250 tid   Vitals:   04/25/20 1149 04/25/20 1225 04/25/20 1432 04/25/20 1600  BP: 131/83 (!) 126/96 94/67   Pulse: 86 88 100   Resp: 18 13 14    Temp: 97.8 F (36.6 C) 98 F (36.7 C) 97.9 F (36.6 C) 98 F (36.7 C)  TempSrc: Oral Oral Oral Oral  SpO2: 100% 96% 97%   Weight: 60.5 kg     Height:        Exam:   Alert, pleasant, mostly oriented  no jvd  Chest cta bilat  Cor reg no RG  Abd soft ntnd no ascites   Ext no LE edema   Alert, NF, Ox 1  RIJ TDC    OP HD: [facility] (?)   From Jan 2021 > 3.5h  TDC (multiple AVF attempts failed)  62.5kg  2/2.5 bath  400/600  Summary: 69 yo w/ hx DM, CVA w/ residual L sided weakness, ESRD on HD. Here in Jan 2021 w/ CVA/ endocarditis and deconditioning. Last HD Friday. Presented to [facility] w/ n/v/d and K+ 7.5, found to be COVID+. Transferred to [facility] for dialysis and has now completed HD this am (5/31).   Assessment/ Plan: 1. AMS/ new onset seizures  - nonconvulsive seizures picked up on EEG. Depakote started, no further staring spells and pt is alert and interactive today. 2. Hyperkalemia - resolved 3. ESRD: normally MWF. HD today off schedule. HD Friday to get back on .  4. HTN/ vol : getting coreg and norvasc, BP's soft to low normal. Will hold BP lowering meds for now.  5. Anemia of ESRD: does not appear to be an issue at this time, no ESA required  6. MBD ckd: does not know home meds for this.  Eventually will obtain from her home unit  7. COVID pos-  In isolation.  No resp symptoms and no O2 req-  Per hosps 8. N/V/D- likely due to COVID, resolved for the most part 9. Dispo - possible dc tomorrow after HD    Rob Cadince Hilscher 04/25/2020, 4:13 PM   Recent Labs  Lab 04/24/20 1131 04/24/20 1131 04/25/20 0533 04/25/20 0915  K 3.9  --  4.1  --   BUN 21  --  33*  --   CREATININE 5.15*  --  6.71*   --   CALCIUM 8.5*  --  8.3*  --   PHOS  --   --  5.3*  --   HGB 12.1   < > 12.6 12.5   < > = values in this interval not displayed.   Inpatient medications: . albuterol  10 mg Nebulization Once  . apixaban  5 mg Oral BID  . Chlorhexidine Gluconate Cloth  6 each Topical Q0600  . insulin aspart  10 Units Intravenous Once   And  . dextrose  1 ampule Intravenous Once  . divalproex  250 mg Oral Q8H  . heparin sodium (porcine)  1,000 Units Intravenous Q T,Th,Sa-HD  . isosorbide mononitrate  60 mg Oral Daily  . pantoprazole  40 mg Oral Daily   . calcium gluconate    . cefTRIAXone (ROCEPHIN)  IV 1 g (04/25/20 0002)   acetaminophen **OR** [DISCONTINUED] acetaminophen, hydrALAZINE, metoCLOPramide (REGLAN) injection, [DISCONTINUED] ondansetron **OR** ondansetron (ZOFRAN) IV

## 2020-04-25 NOTE — Progress Notes (Addendum)
PROGRESS NOTE                                                                                                                                                                                                             Patient Demographics:    [name], is a 69 y.o. female, DOB - 05/22/1951, WHQ:[id number]  Outpatient Primary MD for the patient is Sasser, Silvestre Moment, MD    LOS - 3  Admit date - 04/21/2020    Chief Complaint  Patient presents with  . Emesis       Brief Narrative - 69 y.o. 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   Subjective:   Patient in bed, there is no significant events overnight, today she denies any complaints .   Assessment  & Plan :    Acute COVID-19 infection in a patient who has been vaccinated  -Concurrent family she did receive a DuoNeb vaccine x2 in March. -Appears to be asymptomatic, no pulmonary symptoms, chest x-ray clear, minimal nausea vomiting and diarrhea likely due to COVID-19 gastroenteritis which seems to have improved already.  Will monitor closely including her inflammatory markers.  Encouraged the patient to sit up in chair in the daytime use I-S and flutter valve for pulmonary toiletry and then prone in bed when at night.  Will advance activity and titrate down oxygen as possible.  SpO2: 97 % O2 Flow Rate (L/min): 3 L/min  Recent Labs  Lab 04/21/20 2136 04/23/20 0541 04/24/20 1131 04/25/20 0533  CRP  --  0.6 0.6  0.5  DDIMER  --   --  0.51* 0.47  BNP  --  220.1* 101.6* 136.3*  SARSCOV2NAA POSITIVE*  --   --   --     Hepatic Function Latest Ref Rng & Units 04/25/2020 04/24/2020 04/21/2020  Total Protein 6.5 - 8.1 g/dL 6.9 7.0 8.5(H)  Albumin 3.5 - 5.0 g/dL 3.1(L) 3.2(L)  4.0  AST 15 - 41 U/L _0 ALT 0 - 44 U/L _1 Alk Phosphatase 38 - 126 U/L 97 106 130(H)  Total Bilirubin 0.3 - 1.2 mg/dL 0.6 0.6 0.9    Severe hyperkalemia in a patient with ESRD on MWF  -Pneumonia on diet, she is currently back to renal diet, as well has been adjusted with hemodialysis,.  Acute metabolic encephalopathy -Patient had an episode of encephalopathic, please see discussion below under seizures and risk of hypoglycemia. -As well patient with some mild hospital-acquired delirium.  Seizures -Patient has been having transient episodes of during, where she is unresponsive and confused, her EEG was with no evidence of seizure activity, but as discussed with Dr. Cheral Marker, this appears to be highly suspicious for seizures, so recommendation was to start patient on Depakote.  Diabetes mellitus, type II , uncontrolled with hypoglycemia . -Her A1c is 6.9, she was on low-dose insulin sliding scale, her diabetes mellitus appears to be very brittle, where she was 262 >>,24. -Did require D5 drip overnight, I will go ahead and discontinue, will watch her CBG closely coverage.  Essential hypertension.  Home medications continued Norvasc added for better control.  History of mitral valve endocarditis with septic embolism.  Supportive care.    History of right IJ DVT.  On Eliquis continue.    CVA with minimal left-sided weakness.  Supportive care.     Lab Results  Component Value Date   HGBA1C 6.9 (H) 04/23/2020   CBG (last 3)  Recent Labs    04/25/20 0746 04/25/20 1229 04/25/20 1430  GLUCAP 85 95 181*     Condition - Fair  Family Communication  : Discussed with son via phone  Code Status :   Full  Consults  :  Renal  Procedures  :  HD  PUD Prophylaxis : PPI  Disposition Plan  :    Status is: Inpatient  Remains inpatient appropriate because:Unsafe d/c plan   Dispo: The patient is from: Home              Anticipated d/c is to: Home              Anticipated d/c date is: 1 days              Patient currently is not medically stable to d/c.  ESRD, seizures.  DVT Prophylaxis  :  Heparin    Lab Results  Component Value Date   PLT 102 (L) 04/25/2020    Diet :  Diet Order            Diet renal with fluid restriction Fluid restriction: 1200 mL Fluid; Room service appropriate? Yes; Fluid consistency: Thin  Diet effective now               Inpatient Medications  Scheduled Meds: . albuterol  10 mg Nebulization Once  . apixaban  5 mg Oral BID  . Chlorhexidine Gluconate Cloth  6 each Topical Q0600  . insulin aspart  10 Units Intravenous Once   And  . dextrose  1 ampule Intravenous Once  . divalproex  250 mg Oral Q8H  . heparin sodium (porcine)  1,000 Units Intravenous Q T,Th,Sa-HD  . isosorbide mononitrate  60 mg Oral Daily  . methylPREDNISolone (SOLU-MEDROL) injection  80 mg Intravenous Once  . pantoprazole  40 mg Oral Daily   Continuous Infusions: . calcium gluconate    . cefTRIAXone (ROCEPHIN)  IV 1 g (04/25/20 0002)  .  dextrose 10 mL/hr at 04/24/20 2232   PRN Meds:.acetaminophen **OR** [DISCONTINUED] acetaminophen, hydrALAZINE, metoCLOPramide (REGLAN) injection, [DISCONTINUED] ondansetron **OR** ondansetron (ZOFRAN) IV  Antibiotics  :    Anti-infectives (From admission, onward)   Start     Dose/Rate Route Frequency Ordered Stop   04/23/20 2345  cefTRIAXone (ROCEPHIN) 1 g in sodium chloride 0.9 % 100 mL IVPB     1 g 200 mL/hr over 30 Minutes Intravenous Every 24 hours 04/23/20 2333          Phillips Climes M.D on 04/25/2020 at 3:17 PM   [facility] -  Office  [phone]     See all Orders from today for further details     Objective:   Vitals:   04/25/20 1130 04/25/20 1149 04/25/20 1225 04/25/20 1432  BP: 121/84 131/83 (!) 126/96 94/67  Pulse: 92 86 88 100  Resp: _0 Temp:  97.8 F (36.6 C) 98 F (36.7 C) 97.9 F (36.6 C)  TempSrc:  Oral Oral Oral  SpO2:  100% 96% 97%  Weight:  60.5 kg    Height:        Wt Readings from Last 3 Encounters:  04/25/20 60.5 kg  02/01/20 65.8 kg  01/24/20 65.8 kg     Intake/Output Summary (Last 24 hours) at 04/25/2020 1517 Last data filed at 04/25/2020 1500 Gross per 24 hour  Intake 832.17 ml  Output 30 ml  Net 802.17 ml     Physical Exam  Awake Alert, Oriented X 3, No new F.N deficits, Normal affect Symmetrical Chest wall movement, Good air movement bilaterally, CTAB RRR,No Gallops,Rubs or new Murmurs, No Parasternal Heave +ve B.Sounds, Abd Soft, No tenderness, No rebound - guarding or rigidity. No Cyanosis, Clubbing or edema, No new Rash or bruise       Data Review:    CBC Recent Labs  Lab 04/21/20 2155 04/23/20 0541 04/24/20 1131 04/25/20 0533 04/25/20 0915  WBC 9.9 8.6 5.1 6.2 6.3  HGB 13.4 13.4 12.1 12.6 12.5  HCT 43.5 43.3 38.1 39.6 39.5  PLT 170 116* 104* 98* 102*  MCV 89.3 89.1 88.0 87.2 87.6  MCH 27.5 27.6 27.9 27.8 27.7  MCHC 30.8 30.9 31.8 31.8 31.6  RDW 14.1 14.1 13.8 13.8 13.6  LYMPHSABS 1.0  --  1.9 2.4  --   MONOABS 0.3  --  0.5 0.7  --   EOSABS 0.0  --  0.0 0.0  --   BASOSABS 0.0  --  0.0 0.0  --     Chemistries  Recent Labs  Lab 04/21/20 2155 04/21/20 2155 04/22/20 0014 04/23/20 0541 04/23/20 1222 04/23/20 2129 04/24/20 1131 04/25/20 0533  NA 139  --   --  137  --   --  136 136  K 7.5*   < > 5.9* 6.8* 7.0*  --  3.9 4.1  CL 99  --   --  95*  --   --  98 98  CO2 25  --   --  23  --   --  25 23  GLUCOSE 204*  --   --  89  --   --  130* 86  BUN 42*  --   --  26*  --   --  21 33*  CREATININE 8.55*  --   --  6.57*  --   --  5.15* 6.71*  CALCIUM 9.5  --   --  9.5  --   --  8.5* 8.3*  AST 20  --   --   --    --   --  20 21  ALT 18  --   --   --   --   --  12 14  ALKPHOS 130*  --   --   --   --   --  106 97  BILITOT 0.9  --   --   --   --   --  0.6 0.6  MG  --   --   --  1.9  --   --   --   --   AMMONIA  --   --   --   --   --  32  --   --   TSH  --   --   --   --   --  0.736  --   --   HGBA1C  --   --   --  6.9*  --   --   --   --    < > = values in this interval not displayed.     ------------------------------------------------------------------------------------------------------------------ No results for input(s): CHOL, HDL, LDLCALC, TRIG, CHOLHDL, LDLDIRECT in the last 72 hours.  Lab Results  Component Value Date   HGBA1C 6.9 (H) 04/23/2020   ------------------------------------------------------------------------------------------------------------------ Recent Labs    04/23/20 2129  TSH 0.736    Cardiac Enzymes No results for input(s): CKMB, TROPONINI, MYOGLOBIN in the last 168 hours.  Invalid input(s): CK ------------------------------------------------------------------------------------------------------------------    Component Value Date/Time   BNP 136.3 (H) 04/25/2020 0533    Micro Results Recent Results (from the past 240 hour(s))  SARS Coronavirus 2 by RT PCR (hospital order, performed in [facility] hospital lab) Nasopharyngeal Nasopharyngeal Swab     Status: Abnormal   Collection Time: 04/21/20  9:36 PM   Specimen: Nasopharyngeal Swab  Result Value Ref Range Status   SARS Coronavirus 2 POSITIVE (A) NEGATIVE Final    Comment: RESULT CALLED TO, READ BACK BY AND VERIFIED WITH: POINTDEXTER,M AT 2249 ON 04/21/2020 BY MOSLEY,J (NOTE) SARS-CoV-2 target nucleic acids are DETECTED SARS-CoV-2 RNA is generally detectable in upper respiratory specimens  during the acute phase of infection.  Positive results are indicative  of the presence of the identified virus, but do not rule out bacterial infection or co-infection with other pathogens not detected by the test.   Clinical correlation with patient history and  other diagnostic information is necessary to determine patient infection status.  The expected result is negative. Fact Sheet for Patients:   StrictlyIdeas.no  Fact Sheet for Healthcare Providers:   BankingDealers.co.za   This test is not yet approved or cleared by the [place] FDA and  has been authorized for detection and/or diagnosis of SARS-CoV-2 by FDA under an Emergency Use Authorization (EUA).  This EUA will remain in effect (meaning thi s test can be used) for the duration of  the COVID-19 declaration under Section 564(b)(1) of the Act, 21 U.S.C. section 360-bbb-3(b)(1), unless the authorization is terminated or revoked sooner. Performed at [facility], [address] [place], [place] [address]     Radiology Reports CT HEAD WO CONTRAST  Result Date: 04/23/2020 CLINICAL DATA:  Encephalopathy. EXAM: CT HEAD WITHOUT CONTRAST TECHNIQUE: Contiguous axial images were obtained from the base of the skull through the vertex without intravenous contrast. COMPARISON:  Brain MR 11/14/2019.  03/07/2020 from [facility]. FINDINGS: Brain: Cerebral atrophy. Mild low density in the periventricular white matter likely related to small vessel disease.  No mass lesion, hemorrhage, hydrocephalus, acute infarct, intra-axial, or extra-axial fluid collection. Vascular: Intracranial atherosclerosis. Skull: Normal skull. Sinuses/Orbits: Normal imaged portions of the orbits and globes. Clear paranasal sinuses and mastoid air cells. Other: None. IMPRESSION: 1. No acute intracranial abnormality. 2. Cerebral atrophy and small vessel ischemic change. Electronically Signed   By: Abigail Miyamoto M.D.   On: 04/23/2020 20:39   MR BRAIN WO CONTRAST  Result Date: 04/24/2020 CLINICAL DATA:  Episode of decreased responsiveness EXAM: MRI HEAD WITHOUT CONTRAST TECHNIQUE: Multiplanar, multiecho pulse sequences of the brain and  surrounding structures were obtained without intravenous contrast. COMPARISON:  11/14/2019 FINDINGS: Motion artifact is present. Brain: There is no acute infarction or intracranial hemorrhage. Chronic infarcts of the corona radiata and centrum semiovale. Unclear why diffusion hyperintensity versus several months later but extent is unchanged. There is wallerian degeneration along the cerebral peduncles. Additional chronic infarcts of left thalamus and cerebellum. Other patchy T2 hyperintensity in the supratentorial white matter likely reflects stable chronic microvascular ischemic changes. Ventricles are stable in size. There is no intracranial mass, mass effect, or edema. There is no hydrocephalus or extra-axial fluid collection. Minimal focus of susceptibility the identified with chronic microhemorrhage or mineralization. Vascular: Major vessel flow voids at the skull base are preserved. Skull and upper cervical spine: Normal marrow signal is preserved. Sinuses/Orbits: Paranasal sinuses are aerated. Orbits are unremarkable. Other: Sella is unremarkable.  Mastoid air cells are clear. IMPRESSION: No acute infarction, hemorrhage, or mass. Chronic infarcts detailed above. Electronically Signed   By: Macy Mis M.D.   On: 04/24/2020 14:32   DG Chest Port 1 View  Result Date: 04/21/2020 CLINICAL DATA:  Cough, wheezing, diarrhea. EXAM: PORTABLE CHEST 1 VIEW COMPARISON:  01/24/2020 FINDINGS: Right-sided dialysis catheter in place. The heart is normal in size. Unchanged mediastinal contours. Aortic atherosclerosis. No pulmonary edema. No focal airspace disease. No pleural fluid or pneumothorax. No acute osseous abnormalities are seen. IMPRESSION: No acute chest findings. Aortic Atherosclerosis (ICD10-I70.0). Electronically Signed   By: Keith Rake M.D.   On: 04/21/2020 22:27   EEG adult  Result Date: 04/24/2020 Lora Havens, MD     04/24/2020  9:53 AM Patient Name: Malkia Nippert MRN: [id number]  Epilepsy Attending: Lora Havens Referring Physician/Provider: Dr. Lala Lund Date: 04/24/2020 Duration: 25.59 minutes Patient history: 69 year old female presented with altered mental status.  EEG evaluate for seizures. Level of alertness: Awake, asleep AEDs during EEG study: None Technical aspects: This EEG study was done with scalp electrodes positioned according to the 10-20 International system of electrode placement. Electrical activity was acquired at a sampling rate of _0  and reviewed with a high frequency filter of _1  and a low frequency filter of _2 . EEG data were recorded continuously and digitally stored. Description: T no clear posterior dominant rhythm was seen.  Sleep was characterized by vertex waves, sleep spindles (12 to 14 Hz), maximal frontocentral region.  EEG showed intermittent generalized and maximal left temporal region 3 to 5 Hz theta-delta slowing.  Hyperventilation and photic stimulation were not performed.   ABNORMALITY -Intermittent slow, generalized and maximal left temporal region IMPRESSION: This study is suggestive of nonspecific cortical dysfunction in left temporal region as well as moderate diffuse encephalopathy.  No seizures or epileptiform discharges were seen throughout the recording. Priyanka [name]

## 2020-04-25 NOTE — Progress Notes (Signed)
PT Cancellation Note  Patient Details Name: [name] MRN: [id number] DOB: 03/08/1951   Cancelled Treatment:    Reason Eval/Treat Not Completed: Patient at procedure or test/unavailable. Pt in HD.    [place] 04/25/2020, 11:14 AM [place] Pager [phone] Office [phone]

## 2020-04-25 NOTE — Progress Notes (Addendum)
NEUROLOGY PROGRESS NOTE   Subjective: Patient currently in dialysis.  Resting comfortably.  Able to answer questions without difficulty.  States that she does not feel she has had another staring spell.  Has no complaints about side effects of Depakote, which she was started on yesterday.   Exam: Vitals:   04/25/20 0825 04/25/20 0845  BP: (!) 153/98 (!) 131/95  Pulse: 89 91  Resp: 20 18  Temp:    SpO2:       Neuro:  Mental Status: Alert, oriented, thought content appropriate.  Speech fluent without evidence of aphasia.  Able to follow simple commands without difficulty. Cranial Nerves: II:  Visual fields grossly normal, PERRL.  III,IV, VI: ptosis not present, EOMI V,VII: smile symmetric VIII: hearing normal bilaterally Motor: Moving all extremities antigravity Sensory: Pinprick and light touch intact throughout, bilaterally     Medications:  Scheduled: . albuterol  10 mg Nebulization Once  . apixaban  5 mg Oral BID  . Chlorhexidine Gluconate Cloth  6 each Topical Q0600  . insulin aspart  10 Units Intravenous Once   And  . dextrose  1 ampule Intravenous Once  . divalproex  250 mg Oral Q8H  . heparin sodium (porcine)      . isosorbide mononitrate  60 mg Oral Daily  . methylPREDNISolone (SOLU-MEDROL) injection  80 mg Intravenous Once  . pantoprazole  40 mg Oral Daily   Continuous: . calcium gluconate    . cefTRIAXone (ROCEPHIN)  IV 1 g (04/25/20 0002)  . dextrose 10 mL/hr at 04/24/20 2232   BDZ:[id number] **OR** [DISCONTINUED] acetaminophen, hydrALAZINE, metoCLOPramide (REGLAN) injection, [DISCONTINUED] ondansetron **OR** ondansetron (ZOFRAN) IV  Pertinent Labs/Diagnostics:  B12-568 TSH-0.736 NH3-32  CT HEAD WO CONTRAST Result Date: 04/23/2020  IMPRESSION: 1. No acute intracranial abnormality. 2. Cerebral atrophy and small vessel ischemic change. Electronically Signed   By: Abigail Miyamoto M.D.   On: 04/23/2020 20:39   MR BRAIN WO CONTRAST Result Date:  04/24/2020  IMPRESSION: No acute infarction, hemorrhage, or mass. Chronic infarcts detailed above. Electronically Signed   By: Macy Mis M.D.   On: 04/24/2020 14:32   EEG adult Result Date: 04/24/2020  IMPRESSION: This study is suggestive of nonspecific cortical dysfunction in left temporal region as well as moderate diffuse encephalopathy.  No seizures or epileptiform discharges were seen throughout the recording. Priyanka Cipriano Mile PA-C [facility] [phone]  Assessment: 69 year old female who was brought in secondary to sudden onset of decreased level of consciousness and blank stare. Initially thought to be possible dialysis disequilibrium.  However, patient's EEG does suggest nonspecific cortical dysfunction in left temporal region as well as moderate diffuse encephalopathy.  She also had recurrent abrupt spells of staring with decreased level of consciousness on 6/2 with confusion afterwards, suggestive of partial complex seizures.  1. The patient was started on Depakote 250 mg every 8 hours for treatment of probable complex partial seizures. Renal impairment limits the use of Keppra.  2. Labs ordered which included ammonia, TSH and B12 were within normal limits.   3. On exam today, the patient is awake, alert and able to follow all commands. She has had no further staring spells.  Recommendations: 1. Seizure precautions 2. Continue Depakote 250 mg PO every 8 hours 3. Per [facility] statutes, patients with seizures are not allowed to drive until  they have been seizure-free for six months. Use caution when using heavy equipment or power tools. Avoid working on ladders or at heights.  Take showers instead of baths. Ensure the water temperature is not too high on the home water heater. Do not go swimming alone. When caring for infants or small children, sit down when holding, feeding, or changing them to minimize risk of injury to the child in the event you  have a seizure. Also, Maintain good sleep hygiene. Avoid alcohol.   Electronically signed: Dr. Kerney Elbe 04/25/2020, 8:56 AM

## 2020-04-25 NOTE — Telephone Encounter (Signed)
I cancelled appt today.  I LMVM on pts phone but could not LM as pts VM full.   DPR only LM on home #.  Son came in with pt at visit.  I called son and made appt for 05-06-20 at 1515.

## 2020-04-26 DIAGNOSIS — G8190 Hemiplegia, unspecified affecting unspecified side: Secondary | ICD-10-CM | POA: Diagnosis not present

## 2020-04-26 DIAGNOSIS — E1122 Type 2 diabetes mellitus with diabetic chronic kidney disease: Secondary | ICD-10-CM | POA: Diagnosis not present

## 2020-04-26 DIAGNOSIS — K219 Gastro-esophageal reflux disease without esophagitis: Secondary | ICD-10-CM | POA: Diagnosis not present

## 2020-04-26 DIAGNOSIS — N186 End stage renal disease: Secondary | ICD-10-CM | POA: Diagnosis not present

## 2020-04-26 DIAGNOSIS — R569 Unspecified convulsions: Secondary | ICD-10-CM | POA: Diagnosis not present

## 2020-04-26 DIAGNOSIS — Z86718 Personal history of other venous thrombosis and embolism: Secondary | ICD-10-CM | POA: Diagnosis not present

## 2020-04-26 DIAGNOSIS — E162 Hypoglycemia, unspecified: Secondary | ICD-10-CM | POA: Diagnosis not present

## 2020-04-26 LAB — CBC WITH DIFFERENTIAL/PLATELET
Abs Immature Granulocytes: 0.02 10*3/uL (ref 0.00–0.07)
Basophils Absolute: 0 10*3/uL (ref 0.0–0.1)
Basophils Relative: 1 %
Eosinophils Absolute: 0.1 10*3/uL (ref 0.0–0.5)
Eosinophils Relative: 1 %
HCT: 40.1 % (ref 36.0–46.0)
Hemoglobin: 12.5 g/dL (ref 12.0–15.0)
Immature Granulocytes: 1 %
Lymphocytes Relative: 45 %
Lymphs Abs: 1.9 10*3/uL (ref 0.7–4.0)
MCH: 27.4 pg (ref 26.0–34.0)
MCHC: 31.2 g/dL (ref 30.0–36.0)
MCV: 87.9 fL (ref 80.0–100.0)
Monocytes Absolute: 0.3 10*3/uL (ref 0.1–1.0)
Monocytes Relative: 8 %
Neutro Abs: 1.9 10*3/uL (ref 1.7–7.7)
Neutrophils Relative %: 44 %
Platelets: 108 10*3/uL — ABNORMAL LOW (ref 150–400)
RBC: 4.56 MIL/uL (ref 3.87–5.11)
RDW: 13.4 % (ref 11.5–15.5)
WBC: 4.2 10*3/uL (ref 4.0–10.5)
nRBC: 0 % (ref 0.0–0.2)

## 2020-04-26 LAB — GLUCOSE, CAPILLARY
Glucose-Capillary: 112 mg/dL — ABNORMAL HIGH (ref 70–99)
Glucose-Capillary: 126 mg/dL — ABNORMAL HIGH (ref 70–99)
Glucose-Capillary: 155 mg/dL — ABNORMAL HIGH (ref 70–99)
Glucose-Capillary: 98 mg/dL (ref 70–99)

## 2020-04-26 LAB — COMPREHENSIVE METABOLIC PANEL
ALT: 16 U/L (ref 0–44)
AST: 21 U/L (ref 15–41)
Albumin: 3.1 g/dL — ABNORMAL LOW (ref 3.5–5.0)
Alkaline Phosphatase: 98 U/L (ref 38–126)
Anion gap: 11 (ref 5–15)
BUN: 7 mg/dL — ABNORMAL LOW (ref 8–23)
CO2: 28 mmol/L (ref 22–32)
Calcium: 8.1 mg/dL — ABNORMAL LOW (ref 8.9–10.3)
Chloride: 99 mmol/L (ref 98–111)
Creatinine, Ser: 3.01 mg/dL — ABNORMAL HIGH (ref 0.44–1.00)
GFR calc Af Amer: 18 mL/min — ABNORMAL LOW (ref >60)
GFR calc non Af Amer: 15 mL/min — ABNORMAL LOW (ref >60)
Glucose, Bld: 141 mg/dL — ABNORMAL HIGH (ref 70–99)
Potassium: 3.6 mmol/L (ref 3.5–5.1)
Sodium: 138 mmol/L (ref 135–145)
Total Bilirubin: 0.7 mg/dL (ref 0.3–1.2)
Total Protein: 6.8 g/dL (ref 6.5–8.1)

## 2020-04-26 MED ORDER — HEPARIN SODIUM (PORCINE) 1000 UNIT/ML IJ SOLN
INTRAMUSCULAR | Status: AC
  Filled 2020-04-26: qty 4

## 2020-04-26 MED ORDER — GABAPENTIN 300 MG PO CAPS
300.0000 mg | ORAL_CAPSULE | Freq: Every day | ORAL | Status: DC | PRN
Start: 2020-04-26 — End: 2023-02-08

## 2020-04-26 MED ORDER — DIVALPROEX SODIUM 250 MG PO DR TAB: 250.0000 mg | DELAYED_RELEASE_TABLET | Freq: Three times a day (TID) | ORAL | 0 refills | Status: DC

## 2020-04-26 NOTE — Progress Notes (Signed)
Discharge records faxed to OP HD clinic/[facility] to provide continuity of care.  Alphonzo Cruise, [place] Renal Navigator [phone]

## 2020-04-26 NOTE — TOC Transition Note (Signed)
Transition of Care [facility]) - CM/SW Discharge Note   Patient Details  Name: [name] MRN: [id number] Date of Birth: Oct 23, 1951  Transition of Care [facility]) CM/SW Contact:  Benard Halsted, LCSW Phone Number: 04/26/2020, 1:58 PM   Clinical Narrative:    CSW spoke with patient again regarding home health and discharge plan for today. She again declined home health services and stated she has all equipment at home. Her son will pick her up and transport her to dialysis sessions. She is aware her medications have been sent to her pharmacy. No other needs identified at this time.    Final next level of care: Home/Self Care Barriers to Discharge: No Barriers Identified   Patient Goals and CMS Choice Patient states their goals for this hospitalization and ongoing recovery are:: Return home CMS Medicare.gov Compare Post Acute Care list provided to:: Patient Choice offered to / list presented to : Patient  Discharge Placement                Patient to be transferred to facility by: car   Patient and family notified of of transfer: 04/26/20  Discharge Plan and Services                          HH Arranged: Refused [facility]          Social Determinants of Health (SDOH) Interventions     Readmission Risk Interventions Readmission Risk Prevention Plan 09/12/2019 09/11/2019 07/25/2019  Transportation Screening - Complete -  PCP or Specialist Appt within 3-5 Days - Complete Not Complete  Not Complete comments - - Message left for patient PCP office requesting follow up appointment. Return call not received.  Long or Home Care Consult Not Complete Complete -  HRI or Home Care Consult comments patient declined - -  Social Work Consult for [place] Planning/Counseling - Complete -  Palliative Care Screening - Not Applicable -  Medication Review (RN Care Manager) - Complete -  Some recent data might be hidden

## 2020-04-26 NOTE — Progress Notes (Signed)
Patient discharge teaching given, including activity, diet, follow-up appoints, and medications. Reviewed with son as well.  Patient verbalized understanding of all discharge instructions. IV access was d/c'd. Vitals are stable. Skin is intact except as charted in most recent assessments. Pt to be escorted out by NT, to be driven home by family.

## 2020-04-26 NOTE — Progress Notes (Signed)
Renal Navigator received message from Attending that patient may be ready for discharge tomorrow, 04/26/20. Navigator spoke with patient's OP HD Clinic Manager/B. Deloria Lair, who states they will be prepared to take patient back on a 3rd shift, MWF schedule in order to treat patient in isolation due to her COVID positive status, on Monday, 04/29/20, and therefore, patient needs to have HD in the hospital tomorrow to get her back on OP HD schedule prior to discharge.  Navigator called patient's son/Chad-listed in Epic ER contacts-left generic message to request a call back. Navigator called patient in her room, who states she is certain that her son and or numerous other family members will transport her to/from HD, even on a 3rd shift schedule. She appreciated call from Navigator and understands that she will start new schedule at OP HD clinic on Monday. Per Clinic Manager, she needs to arrive at 4:15pm and call when she gets there, prior to entering the building.  Patient's son returned Navigator's call and states he will continue to transport patient on 3rd shift schedule and thanked Navigator for the information. Patient is cleared for discharge from OP HD standpoint.  Alphonzo Cruise, [place] Renal Navigator [phone]

## 2020-04-26 NOTE — Progress Notes (Signed)
PT Cancellation Note  Patient Details Name: [name] MRN: [id number] DOB: [birth year]   Cancelled Treatment:    Reason Eval/Treat Not Completed: Patient at procedure or test/unavailable. Pt at HD this AM and now preparing for dc home.   Shary Decamp [place] 04/26/2020, 4:09 PM [place] Pager [phone] Office [phone]

## 2020-04-26 NOTE — Progress Notes (Signed)
[facility] Progress Note  Subjective:  Patient not examined today directly given COVID-19 + status, utilizing data taken from chart +/- discussions w/ providers and staff.     Vitals:   04/26/20 1100 04/26/20 1113 04/26/20 1135 04/26/20 1200  BP: (!) 117/102 (!) 133/94 (!) 127/93   Pulse: 98 96 97   Resp:  18 18   Temp:  97.7 F (36.5 C) 97.7 F (36.5 C) 97.6 F (36.4 C)  TempSrc:  Oral Oral Oral  SpO2:  100% 98%   Weight:  62.7 kg    Height:        Exam:   Patient not examined today directly given COVID-19 + status, utilizing data taken from chart +/- discussions w/ providers and staff.      OP HD: [facility] (?)   From Jan 2021 > 3.5h  TDC (multiple AVF attempts failed)  62.5kg  2/2.5 bath  400/600  Summary: 69 yo w/ hx DM, CVA w/ residual L sided weakness, ESRD on HD. Here in Jan 2021 w/ CVA/ endocarditis and deconditioning. Last HD Friday. Presented to [facility] w/ n/v/d and K+ 7.5, found to be COVID+. Transferred to [facility] for dialysis and has now completed HD this am (5/31).   Assessment/ Plan: 1. AMS/ new onset seizures  - nonconvulsive seizures on EEG explaining sudden LOC/ staring spells (= post-ictal and active absence seizures). Depakote started, no further staring spells and pt is alert and interactive now. For dc today 2. ESRD: normally MWF. HD today/ Friday.  3. HTN/ vol : holding coreg and norvasc, BP's on the low side. Resume prn.   4. Anemia of ESRD: does not appear to be an issue at this time, no ESA required  5. MBD ckd: does not know home meds for this.  Eventually will obtain from her home unit  6. COVID pos-  In isolation.  No resp symptoms and no O2 req-  Per hosps 7. N/V/D- likely due to COVID, resolved for the most part 8. Dispo - for dc reportedly today    Rob Nehemie Casserly 04/26/2020, 4:00 PM   Recent Labs  Lab 04/25/20 0533 04/25/20 0533 04/25/20 0915 04/26/20 1404  K 4.1  --   --  3.6  BUN 33*  --   --  7*  CREATININE 6.71*  --   --   3.01*  CALCIUM 8.3*  --   --  8.1*  PHOS 5.3*  --   --   --   HGB 12.6   < > 12.5 12.5   < > = values in this interval not displayed.   Inpatient medications: . albuterol  10 mg Nebulization Once  . apixaban  5 mg Oral BID  . Chlorhexidine Gluconate Cloth  6 each Topical Q0600  . Chlorhexidine Gluconate Cloth  6 each Topical Q0600  . insulin aspart  10 Units Intravenous Once   And  . dextrose  1 ampule Intravenous Once  . divalproex  250 mg Oral Q8H  . heparin sodium (porcine)      . heparin sodium (porcine)  1,000 Units Intravenous Q T,Th,Sa-HD  . isosorbide mononitrate  60 mg Oral Daily  . pantoprazole  40 mg Oral Daily   . calcium gluconate    . cefTRIAXone (ROCEPHIN)  IV 1 g (04/25/20 2359)   acetaminophen **OR** [DISCONTINUED] acetaminophen, hydrALAZINE, metoCLOPramide (REGLAN) injection, [DISCONTINUED] ondansetron **OR** ondansetron (ZOFRAN) IV

## 2020-04-26 NOTE — Discharge Summary (Signed)
[name], is a 69 y.o. female  DOB 1950-11-25  MRN [id number].  Admission date:  04/21/2020  Admitting Physician  Oswald Hillock, MD  Discharge Date:  04/26/2020   Primary MD  Quintin Alto, Silvestre Moment, MD  Recommendations for primary care physician for things to follow:  - please check CBC, CMP during next visit.   Admission Diagnosis  Hyperkalemia [E87.5] Nausea vomiting and diarrhea [R11.2, R19.7] COVID-19 [U07.1]   Discharge Diagnosis  Hyperkalemia [E87.5] Nausea vomiting and diarrhea [R11.2, R19.7] COVID-19 [U07.1]    Active Problems:   Hyperkalemia      Past Medical History:  Diagnosis Date  . Arthritis    hands  . Constipation   . Diabetes mellitus without complication (HCC)    Type II  . ESRD (end stage renal disease) ([place])     M/W/F- Hemodialysis  . GERD (gastroesophageal reflux disease)   . Headache   . Hyperlipidemia   . Hypertension   . Irregular heart rate   . Stroke [facility])    TIA - approx 2010- no residual    Past Surgical History:  Procedure Laterality Date  . ABDOMINAL HYSTERECTOMY    . AORTIC ARCH ANGIOGRAPHY N/A 03/28/2019   Procedure: AORTIC ARCH ANGIOGRAPHY;  Surgeon: Waynetta Sandy, MD;  Location: [place] [facility];  Service: Cardiovascular;  Laterality: N/A;  . AV FISTULA PLACEMENT Left 06/13/2018   Procedure: ARTERIOVENOUS (AV) FISTULA CREATION;  Surgeon: Rosetta Posner, MD;  Location: [place];  Service: Vascular;  Laterality: Left;  . AV FISTULA PLACEMENT Left 08/18/2018   Procedure: INSERTION OF 4-7MM X 45CM ARTERIOVENOUS (AV) GORE-TEX GRAFT LEFT  FOREARM;  Surgeon: Rosetta Posner, MD;  Location: [place];  Service: Vascular;  Laterality: Left;  . AV FISTULA PLACEMENT Right 04/06/2019   Procedure: INSERTION OF ARTERIOVENOUS (AV) GORE-TEX GRAFT RIGHT UPPER ARM;  Surgeon: Waynetta Sandy, MD;  Location: [place];  Service: Vascular;  Laterality: Right;  .  AV FISTULA PLACEMENT Right 04/13/2019   Procedure: INSERTION OF ARTERIOVENOUS (AV) BOVINE  ARTEGRAFT GRAFT RIGHT UPPER EXTREMITY;  Surgeon: Serafina Mitchell, MD;  Location: [place];  Service: Vascular;  Laterality: Right;  . [place] REMOVAL Right 05/16/2019   Procedure: REMOVAL OF ARTERIOVENOUS GORETEX GRAFT ([place]) RIGHT ARM;  Surgeon: Angelia Mould, MD;  Location: [place];  Service: Vascular;  Laterality: Right;  . BASCILIC VEIN TRANSPOSITION Right 03/07/2019   Procedure: First Stage Bascilic Vein Transposition Right Arm;  Surgeon: Serafina Mitchell, MD;  Location: [place];  Service: Vascular;  Laterality: Right;  . CATARACT EXTRACTION Right 2005  . INSERTION OF DIALYSIS CATHETER N/A 08/18/2018   Procedure: INSERTION OF DIALYSIS CATHETER;  Surgeon: Rosetta Posner, MD;  Location: [place];  Service: Vascular;  Laterality: N/A;  . INSERTION OF DIALYSIS CATHETER Right 07/25/2019   Procedure: INSERTION OF DIALYSIS CATHETER RIGHT INTERNAL JUGULAR (TUNNELED);  Surgeon: Virl Cagey, MD;  Location: [facility];  Service: General;  Laterality: Right;  . IR FLUORO GUIDE CV LINE LEFT  10/26/2019  . IR FLUORO GUIDE CV LINE RIGHT  12/06/2018  . IR PTA ADDL CENTRAL DIALYSIS SEG THRU DIALY CIRCUIT RIGHT Right 12/06/2018  . IR REMOVAL TUN CV CATH W/O FL  10/23/2019  . IR US GUIDE VASC ACCESS LEFT  10/26/2019  . TEE WITHOUT CARDIOVERSION N/A 10/26/2019   Procedure: TRANSESOPHAGEAL ECHOCARDIOGRAM (TEE);  Surgeon: Lelon Perla, MD;  Location: [place];  Service: Cardiovascular;  Laterality: N/A;  . THROMBECTOMY AND REVISION OF ARTERIOVENTOUS (AV) GORETEX  GRAFT Left 09/29/2018   Procedure: INSERTION OF ARTERIOVENTOUS (AV) GORETEX  GRAFT ARM;  Surgeon: Waynetta Sandy, MD;  Location: [place];  Service: Vascular;  Laterality: Left;  . UPPER EXTREMITY ANGIOGRAPHY Right 03/28/2019   Procedure: UPPER EXTREMITY ANGIOGRAPHY;  Surgeon: Waynetta Sandy, MD;  Location: [place] [facility];  Service:  Cardiovascular;  Laterality: Right;  . VENOGRAM  10/27/2018   Procedure: Venogram;  Surgeon: Marty Heck, MD;  Location: [place] [facility];  Service: Cardiovascular;;  bilateral arm       History of present illness and  Hospital Course:     Kindly see H&P for history of present illness and admission details, please review complete Labs, Consult reports and Test reports for all details in brief  HPI  from the history and physical done on the day of admission 04/21/2020  [name]  is a 69 y.o. 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  She denies abdominal pain.  Patient says she was unable to tolerate anything by mouth today.  She denies fever or chills.  No shortness of breath.  No coughing up any phlegm.  Denies chest pain.  She does make urine. In the ED, lab work revealed significant hyperkalemia with potassium 7.5.  Nephrology was consulted and recommended patient to be transferred to [facility] for dialysis.  Patient received IV insulin, 1 amp of D50, calcium gluconate.  Repeat potassium has improved to 5.9.  SARS-CoV-2 RT-PCR came back positive. Patient is not requiring oxygen, chest x-ray is unremarkable.  Hospital Course   COVID-19 infection in a patient who has been vaccinated  -Confirmed with patient and family , she did receive both vaccine shots in March of this year, (patient reports it was maternal ). -Appears to be asymptomatic, no pulmonary symptoms, chest x-ray clear, minimal nausea vomiting and diarrhea likely due to COVID-19 gastroenteritis which seems to have improved already.     Severe hyperkalemia in a patient with ESRD on MWF  -Has improved with dialysis  adjustment, as well she was transitioned back to renal  his diet .  Acute metabolic encephalopathy, likely due to seizures. -Please see discussion below under seizures -TSH, B12 and ammonia within normal limits.  Seizures -Patient has been having transient episodes of of staring spells, unresponsive, followed by brief area of confusion, looks like postictal, neurology input greatly appreciated, even though her EEG with no evidence of seizure activity, but not done during these episodes, this is suggestive of partial complex seizures, she was started on Depakote to 50 mg every 8 hours, she had no recurrence of these episodes after it was started.  Diabetes mellitus, type II , uncontrolled with hypoglycemia . -Her A1c is 6.9, she was  on low-dose insulin sliding scale, her diabetes mellitus appears to be very brittle, where she was 262 >>,24. -Overall she was with no significant insulin requirement during hospital stay, she is not on any home medications for diabetes at home.  Essential hypertension.   Continue with home medications  History of mitral valve endocarditis with septic embolism.  Supportive care.    History of right IJ DVT.  On Eliquis continue.    CVA with minimal left-sided weakness.  Supportive care.     Discharge Condition: Stable   Follow UP  Follow-up Information    Buford Dresser, MD .   Specialty: Cardiology Contact information: [address] [place] [address] [place] [address] [phone]             Discharge Instructions  and  Discharge Medications    Discharge Instructions    Discharge instructions   Complete by: As directed    Follow with Primary MD Quintin Alto, Silvestre Moment, MD in 7 days   Get CBC, CMP,  checked  by Primary MD next visit.    Activity: As tolerated with Full fall precautions use walker/cane & assistance as needed   Disposition Home    Diet: renal modified with 1200cc fluid restriction , with feeding assistance and  aspiration precautions.  For Heart failure patients - Check your Weight same time everyday, if you gain over 2 pounds, or you develop in leg swelling, experience more shortness of breath or chest pain, call your Primary MD immediately. Follow Cardiac Low Salt Diet and 1.5 lit/day fluid restriction.   On your next visit with your primary care physician please Get Medicines reviewed and adjusted.   Please request your Prim.MD to go over all Hospital Tests and Procedure/Radiological results at the follow up, please get all Hospital records sent to your Prim MD by signing hospital release before you go home.   If you experience worsening of your admission symptoms, develop shortness of breath, life threatening emergency, suicidal or homicidal thoughts you must seek medical attention immediately by calling 911 or calling your MD immediately  if symptoms less severe.  You Must read complete instructions/literature along with all the possible adverse reactions/side effects for all the Medicines you take and that have been prescribed to you. Take any new Medicines after you have completely understood and accpet all the possible adverse reactions/side effects.   Do not drive, operating heavy machinery, perform activities at heights, swimming or participation in water activities or provide baby sitting services if your were admitted for syncope or siezures until you have seen by Primary MD or a Neurologist and advised to do so again.  Do not drive when taking Pain medications.    Do not take more than prescribed Pain, Sleep and Anxiety Medications  Special Instructions: If you have smoked or chewed Tobacco  in the last 2 yrs please stop smoking, stop any regular Alcohol  and or any Recreational drug use.  Wear Seat belts while driving.   Please note  You were cared for by a hospitalist during your hospital stay. If you have any questions about your discharge medications or the care you received  while you were in the hospital after you are discharged, you can call the unit and asked to speak with the hospitalist on call if the hospitalist that took care of you is not available. Once you are discharged, your primary care physician will handle any further medical issues. Please note that NO REFILLS for any discharge medications will  be authorized once you are discharged, as it is imperative that you return to your primary care physician (or establish a relationship with a primary care physician if you do not have one) for your aftercare needs so that they can reassess your need for medications and monitor your lab values.   Increase activity slowly   Complete by: As directed      Allergies as of 04/26/2020   No Known Allergies     Medication List    STOP taking these medications   calcium carbonate 500 MG chewable tablet Commonly known as: TUMS - dosed in mg elemental calcium   collagenase ointment Commonly known as: SANTYL   lactulose 10 GM/15ML solution Commonly known as: CHRONULAC   lidocaine 5 % Commonly known as: LIDODERM   meclizine 25 MG tablet Commonly known as: ANTIVERT   melatonin 3 MG Tabs tablet   ondansetron 4 MG tablet Commonly known as: Zofran   oxyCODONE 5 MG immediate release tablet Commonly known as: Oxy IR/ROXICODONE   saccharomyces boulardii 250 MG capsule Commonly known as: FLORASTOR     TAKE these medications   acetaminophen 325 MG tablet Commonly known as: TYLENOL Take 2 tablets (650 mg total) by mouth every 12 (twelve) hours as needed for mild pain (or temp > 37.5 C (99.5 F)).   apixaban 5 MG Tabs tablet Commonly known as: ELIQUIS Take 1 tablet (5 mg total) by mouth 2 (two) times daily.   carvedilol 12.5 MG tablet Commonly known as: COREG Take 1 tablet (12.5 mg total) by mouth 2 (two) times daily.   Darbepoetin Alfa 100 MCG/0.5ML Sosy injection Commonly known as: ARANESP Inject 0.5 mLs (100 mcg total) into the vein every Monday with  hemodialysis.   diclofenac Sodium 1 % Gel Commonly known as: VOLTAREN Apply 2 g topically 4 (four) times daily.   divalproex 250 MG DR tablet Commonly known as: DEPAKOTE Take 1 tablet (250 mg total) by mouth every 8 (eight) hours.   doxercalciferol 4 MCG/2ML injection Commonly known as: HECTOROL Inject 0.75 mLs (1.5 mcg total) into the vein every Monday, Wednesday, and Friday with hemodialysis.   ferric gluconate 125 mg in sodium chloride 0.9 % 100 mL Inject 125 mg into the vein every Monday.   gabapentin 300 MG capsule Commonly known as: Neurontin Take 1 capsule (300 mg total) by mouth daily as needed (nerve pain).   isosorbide mononitrate 60 MG 24 hr tablet Commonly known as: IMDUR Take 1 tablet (60 mg total) by mouth daily.   multivitamin Tabs tablet Take 1 tablet by mouth daily after lunch.   pantoprazole 40 MG tablet Commonly known as: PROTONIX Take 1 tablet (40 mg total) by mouth daily.         Diet and Activity recommendation: See Discharge Instructions above   Consults obtained -  Renal Neurology   Major procedures and Radiology Reports - PLEASE review detailed and final reports for all details, in brief -     CT HEAD WO CONTRAST  Result Date: 04/23/2020 CLINICAL DATA:  Encephalopathy. EXAM: CT HEAD WITHOUT CONTRAST TECHNIQUE: Contiguous axial images were obtained from the base of the skull through the vertex without intravenous contrast. COMPARISON:  Brain MR 11/14/2019.  03/07/2020 from [facility] FINDINGS: Brain: Cerebral atrophy. Mild low density in the periventricular white matter likely related to small vessel disease. No mass lesion, hemorrhage, hydrocephalus, acute infarct, intra-axial, or extra-axial fluid collection. Vascular: Intracranial atherosclerosis. Skull: Normal skull. Sinuses/Orbits: Normal imaged portions of the orbits and  globes. Clear paranasal sinuses and mastoid air cells. Other: None. IMPRESSION: 1. No acute intracranial  abnormality. 2. Cerebral atrophy and small vessel ischemic change. Electronically Signed   By: Abigail Miyamoto M.D.   On: 04/23/2020 20:39   MR BRAIN WO CONTRAST  Result Date: 04/24/2020 CLINICAL DATA:  Episode of decreased responsiveness EXAM: MRI HEAD WITHOUT CONTRAST TECHNIQUE: Multiplanar, multiecho pulse sequences of the brain and surrounding structures were obtained without intravenous contrast. COMPARISON:  11/14/2019 FINDINGS: Motion artifact is present. Brain: There is no acute infarction or intracranial hemorrhage. Chronic infarcts of the corona radiata and centrum semiovale. Unclear why diffusion hyperintensity versus several months later but extent is unchanged. There is wallerian degeneration along the cerebral peduncles. Additional chronic infarcts of left thalamus and cerebellum. Other patchy T2 hyperintensity in the supratentorial white matter likely reflects stable chronic microvascular ischemic changes. Ventricles are stable in size. There is no intracranial mass, mass effect, or edema. There is no hydrocephalus or extra-axial fluid collection. Minimal focus of susceptibility the identified with chronic microhemorrhage or mineralization. Vascular: Major vessel flow voids at the skull base are preserved. Skull and upper cervical spine: Normal marrow signal is preserved. Sinuses/Orbits: Paranasal sinuses are aerated. Orbits are unremarkable. Other: Sella is unremarkable.  Mastoid air cells are clear. IMPRESSION: No acute infarction, hemorrhage, or mass. Chronic infarcts detailed above. Electronically Signed   By: Macy Mis M.D.   On: 04/24/2020 14:32   DG Chest Port 1 View  Result Date: 04/21/2020 CLINICAL DATA:  Cough, wheezing, diarrhea. EXAM: PORTABLE CHEST 1 VIEW COMPARISON:  01/24/2020 FINDINGS: Right-sided dialysis catheter in place. The heart is normal in size. Unchanged mediastinal contours. Aortic atherosclerosis. No pulmonary edema. No focal airspace disease. No pleural fluid or  pneumothorax. No acute osseous abnormalities are seen. IMPRESSION: No acute chest findings. Aortic Atherosclerosis (ICD10-I70.0). Electronically Signed   By: Keith Rake M.D.   On: 04/21/2020 22:27   EEG adult  Result Date: 04/24/2020 Lora Havens, MD     04/24/2020  9:53 AM Patient Name: Toi Stelly MRN: [id number] Epilepsy Attending: Lora Havens Referring Physician/Provider: Dr. Lala Lund Date: 04/24/2020 Duration: 25.59 minutes Patient history: 69 year old female presented with altered mental status.  EEG evaluate for seizures. Level of alertness: Awake, asleep AEDs during EEG study: None Technical aspects: This EEG study was done with scalp electrodes positioned according to the 10-20 International system of electrode placement. Electrical activity was acquired at a sampling rate of 500Hz  and reviewed with a high frequency filter of 70Hz  and a low frequency filter of 1Hz . EEG data were recorded continuously and digitally stored. Description: T no clear posterior dominant rhythm was seen.  Sleep was characterized by vertex waves, sleep spindles (12 to 14 Hz), maximal frontocentral region.  EEG showed intermittent generalized and maximal left temporal region 3 to 5 Hz theta-delta slowing.  Hyperventilation and photic stimulation were not performed.   ABNORMALITY -Intermittent slow, generalized and maximal left temporal region IMPRESSION: This study is suggestive of nonspecific cortical dysfunction in left temporal region as well as moderate diffuse encephalopathy.  No seizures or epileptiform discharges were seen throughout the recording. Priyanka [name]    Micro Results     Recent Results (from the past 240 hour(s))  SARS Coronavirus 2 by RT PCR (hospital order, performed in [facility] hospital lab) Nasopharyngeal Nasopharyngeal Swab     Status: Abnormal   Collection Time: 04/21/20  9:36 PM   Specimen: Nasopharyngeal Swab  Result Value Ref Range Status  SARS Coronavirus 2  POSITIVE (A) NEGATIVE Final    Comment: RESULT CALLED TO, READ BACK BY AND VERIFIED WITH: POINTDEXTER,M AT 2249 ON 04/21/2020 BY MOSLEY,J (NOTE) SARS-CoV-2 target nucleic acids are DETECTED SARS-CoV-2 RNA is generally detectable in upper respiratory specimens  during the acute phase of infection.  Positive results are indicative  of the presence of the identified virus, but do not rule out bacterial infection or co-infection with other pathogens not detected by the test.  Clinical correlation with patient history and  other diagnostic information is necessary to determine patient infection status.  The expected result is negative. Fact Sheet for Patients:   StrictlyIdeas.no  Fact Sheet for Healthcare Providers:   BankingDealers.co.za   This test is not yet approved or cleared by the [place] FDA and  has been authorized for detection and/or diagnosis of SARS-CoV-2 by FDA under an Emergency Use Authorization (EUA).  This EUA will remain in effect (meaning thi s test can be used) for the duration of  the COVID-19 declaration under Section 564(b)(1) of the Act, 21 U.S.C. section 360-bbb-3(b)(1), unless the authorization is terminated or revoked sooner. Performed at [facility], [address], [place], [place] [address]        Today   Subjective:   Adah Stoneberg today has no headache,no chest or abdominal pain,no new weakness tingling or numbness, feels much better wants to go home today, was no further any of the staring spells since she was started on Depakote.  Objective:   Blood pressure (!) 117/102, pulse 98, temperature 98.5 F (36.9 C), temperature source Oral, resp. rate 18, height 5\' 7"  (1.702 m), weight 63.1 kg, SpO2 100 %.   Intake/Output Summary (Last 24 hours) at 04/26/2020 1125 Last data filed at 04/26/2020 1287 Gross per 24 hour  Intake 832.17 ml  Output 40 ml  Net 792.17 ml    Exam Awake Alert, Oriented x  3, No new F.N deficits, Normal affect Symmetrical Chest wall movement, Good air movement bilaterally, CTAB RRR,No Gallops,Rubs or new Murmurs, No Parasternal Heave +ve B.Sounds, Abd Soft, Non tender, No rebound -guarding or rigidity. No Cyanosis, Clubbing or edema, No new Rash or bruise  Data Review   CBC w Diff:  Lab Results  Component Value Date   WBC 6.3 04/25/2020   HGB 12.5 04/25/2020   HCT 39.5 04/25/2020   PLT 102 (L) 04/25/2020   LYMPHOPCT 39 04/25/2020   MONOPCT 10 04/25/2020   EOSPCT 1 04/25/2020   BASOPCT 1 04/25/2020    CMP:  Lab Results  Component Value Date   NA 136 04/25/2020   K 4.1 04/25/2020   CL 98 04/25/2020   CO2 23 04/25/2020   BUN 33 (H) 04/25/2020   CREATININE 6.71 (H) 04/25/2020   PROT 6.9 04/25/2020   ALBUMIN 3.1 (L) 04/25/2020   BILITOT 0.6 04/25/2020   ALKPHOS 97 04/25/2020   AST 21 04/25/2020   ALT 14 04/25/2020  .   Total Time in preparing paper work, data evaluation and todays exam - 23 minutes  Phillips Climes M.D on 04/26/2020 at 11:25 AM  [facility]   Office  [phone]

## 2020-04-30 DIAGNOSIS — G8190 Hemiplegia, unspecified affecting unspecified side: Secondary | ICD-10-CM | POA: Diagnosis not present

## 2020-04-30 DIAGNOSIS — R569 Unspecified convulsions: Secondary | ICD-10-CM | POA: Diagnosis not present

## 2020-04-30 DIAGNOSIS — N186 End stage renal disease: Secondary | ICD-10-CM | POA: Diagnosis not present

## 2020-04-30 DIAGNOSIS — E162 Hypoglycemia, unspecified: Secondary | ICD-10-CM | POA: Diagnosis not present

## 2020-04-30 DIAGNOSIS — Z86718 Personal history of other venous thrombosis and embolism: Secondary | ICD-10-CM | POA: Diagnosis not present

## 2020-04-30 DIAGNOSIS — E1122 Type 2 diabetes mellitus with diabetic chronic kidney disease: Secondary | ICD-10-CM | POA: Diagnosis not present

## 2020-04-30 DIAGNOSIS — K219 Gastro-esophageal reflux disease without esophagitis: Secondary | ICD-10-CM | POA: Diagnosis not present

## 2020-05-01 DIAGNOSIS — N186 End stage renal disease: Secondary | ICD-10-CM | POA: Diagnosis not present

## 2020-05-01 DIAGNOSIS — Z992 Dependence on renal dialysis: Secondary | ICD-10-CM | POA: Diagnosis not present

## 2020-05-03 DIAGNOSIS — N186 End stage renal disease: Secondary | ICD-10-CM | POA: Diagnosis not present

## 2020-05-03 DIAGNOSIS — Z992 Dependence on renal dialysis: Secondary | ICD-10-CM | POA: Diagnosis not present

## 2020-05-05 DIAGNOSIS — G8194 Hemiplegia, unspecified affecting left nondominant side: Secondary | ICD-10-CM | POA: Diagnosis not present

## 2020-05-05 DIAGNOSIS — I639 Cerebral infarction, unspecified: Secondary | ICD-10-CM | POA: Diagnosis not present

## 2020-05-05 DIAGNOSIS — N186 End stage renal disease: Secondary | ICD-10-CM | POA: Diagnosis not present

## 2020-05-05 DIAGNOSIS — I330 Acute and subacute infective endocarditis: Secondary | ICD-10-CM | POA: Diagnosis not present

## 2020-05-06 ENCOUNTER — Ambulatory Visit: Payer: Medicare PPO | Admitting: Adult Health

## 2020-05-06 ENCOUNTER — Encounter: Payer: Self-pay | Admitting: Adult Health

## 2020-05-06 VITALS — BP 128/76 | HR 96 | Ht 67.0 in | Wt 139.0 lb

## 2020-05-06 DIAGNOSIS — I639 Cerebral infarction, unspecified: Secondary | ICD-10-CM

## 2020-05-06 DIAGNOSIS — I10 Essential (primary) hypertension: Secondary | ICD-10-CM | POA: Diagnosis not present

## 2020-05-06 DIAGNOSIS — N186 End stage renal disease: Secondary | ICD-10-CM | POA: Diagnosis not present

## 2020-05-06 DIAGNOSIS — I6381 Other cerebral infarction due to occlusion or stenosis of small artery: Secondary | ICD-10-CM

## 2020-05-06 DIAGNOSIS — E119 Type 2 diabetes mellitus without complications: Secondary | ICD-10-CM

## 2020-05-06 DIAGNOSIS — G40209 Localization-related (focal) (partial) symptomatic epilepsy and epileptic syndromes with complex partial seizures, not intractable, without status epilepticus: Secondary | ICD-10-CM

## 2020-05-06 DIAGNOSIS — Z992 Dependence on renal dialysis: Secondary | ICD-10-CM | POA: Diagnosis not present

## 2020-05-06 MED ORDER — DIVALPROEX SODIUM 250 MG PO DR TAB: 250.0000 mg | DELAYED_RELEASE_TABLET | Freq: Two times a day (BID) | ORAL | 3 refills | Status: DC

## 2020-05-06 NOTE — Progress Notes (Signed)
[facility] [address] [place]. [place] [address] [phone]       STROKE FOLLOW UP NOTE  [name] Date of Birth:  10/15/1951 Medical Record Number:  [id number]   Reason for Referral: stroke follow up    CHIEF COMPLAINT:  Chief Complaint  Patient presents with  . Follow-up    tx rm here for a stroke f/u. Pt said she is having no new sx. Pt is with her friend.    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  She is accompanied by a friend who does not participate in conversation.  Stable from a stroke standpoint with residual left sided weakness. She also continues to experience blurred vision but has not been seen by ophthalmology yet.  Denies worsening or new stroke/TIA symptoms.  She remains nonambulatory due to chronic pain.  Recently seen in ED on 04/21/2020 with nausea, vomiting and diarrhea with significant hyperkalemia with potassium 7.5 as well as reported transient episodes of staring spells, unresponsiveness followed by brief period of confusion.  EEG no evidence of seizure activity.  Questionable partial complex seizures with initiation of Depakote 250 mg twice daily (prescribed every 8 hours but only taking twice daily).  She denies reoccurring episodes and tolerating Depakote without side effects. She has continued on eliquis without bleeding or bruising for MV thrombus and R IJ DVT.  She does have follow-up with cardiology with repeat echo on 05/09/2020.  Blood pressure today 128/76.  No concerns at this time.   History provided for reference purposes only Initial visit 01/23/2020 JM: [name] is a 69 year old female who is being seen today for hospital follow-up after prolonged hospitalization from 10/20/2019 -12/05/2019.  Residual stroke deficits of left-sided weakness and recently transition to  outpatient physical therapy.  She has made great improvement in regards to LUE but continues to have weakness LLE and difficulty with recovery due to left-sided sciatica.  She is nonambulatory at this time and transfers via wheelchair.  Continues on Eliquis without bleeding or bruising for acute DVT and due to lack of insurance coverage, will be transitioning to warfarin for additional 4 to 6 weeks for total 54-month duration.  INR levels planned to be monitored by PCP.  Blood pressure today 154/96.  Continues to be followed by physical medicine and rehab Dr. Ranell Patrick for rehab needs and management of left-sided sciatica felt secondary to neurogenic claudication with impingement of L5 nerve root.  Continues on gabapentin 300 mg twice daily prescribed by physical medicine and rehab.  Complains of left eye blurred vision and dizziness with horizontal and vertical movements.  She has been referred to ophthalmology but has not scheduled appointment at this time.  She does have appointment scheduled tomorrow with ID for hospital follow-up.  No further concerns at this time.  Stroke admission 10/20/2019: Diana McCorkleis a 69 y.o.femalewith history of ESRD ( on dialysis M-W-F), L3-4 discitis/osteomyelitis and Pseudomonas bacteremia (06/2019), TIA 2010, HTN, HLD, Ha, DM2who presented to [facility] on 10/20/2019 with c/o dizziness, left arm weakness and left leg numbness.Shedid not receive IV t-PA due to late presentation (>4.5 hours from time of onset).  Shortly transferred to [facility] ED and evaluated by stroke team and Dr. Erlinda Hong with stroke work-up revealing acute right BG, CR and caudate scattered infarcts embolic pattern secondary to MVA vegetation/endocarditis.  On 12/4, found to have altered mental status with repeat CT  head showing new small frontal lobe white matter infarct new from prior imaging.  Bilateral upper extremity venous Doppler showed right IJ acute DVT, and right brachial vein age  indeterminate DVT.  Lower extremity venous Doppler negative.  TEE showed large vegetation of anterior MV leaflet.  Previously on aspirin and Plavix and transition to warfarin recommended by primary team.  Right IJ acute DVT felt to be related to dialysis catheter with removal of catheter for line holiday.  Previously right arm AV graft removed 04/2019 therefore had tunneled catheter placed to continue HD treatments.  Blood cultures positive for Pseudomonas and initiated on cefepime with prior Pseudomonas infection 06/2019 and repeat blood culture 08/2019.  Also noted for MV endocarditis, placed on antibiotics per ID recommendations.  History of HTN stable.  LDL 65.  DM type II with A1c 7.6.  Other stroke risk factors include advanced age and prior history of TIA 2010.  Residual deficits of left hemiparesis and discharged to CIR on 11/01/2019 for ongoing therapy needs.  During CIR admission, difficulty managing INR levels therefore transition to Eliquis which was recommended for total duration of 3 months for right IJ acute DVT.  Also noted questionable seizure, possibly related to antibiotics.  She continued on both Cipro and Fortaz through 12/06/2019.  Also noted to have steady progression of L3-4 discitis/osteomyelitis with further imaging showing destruction of L3 and L4 vertebral bodies evaluated by neurosurgery without recommended interventions.  She was discharged on 12/05/2019 home with recommendation of home health therapies.    ROS:   14 system review of systems performed and negative with exception of pain, weakness, decreased vision  PMH:  Past Medical History:  Diagnosis Date  . Arthritis    hands  . Constipation   . Diabetes mellitus without complication (HCC)    Type II  . ESRD (end stage renal disease) ([place])     M/W/F- Hemodialysis  . GERD (gastroesophageal reflux disease)   . Headache   . Hyperlipidemia   . Hypertension   . Irregular heart rate   . Stroke [facility])    TIA - approx 2010-  no residual    PSH:  Past Surgical History:  Procedure Laterality Date  . ABDOMINAL HYSTERECTOMY    . AORTIC ARCH ANGIOGRAPHY N/A 03/28/2019   Procedure: AORTIC ARCH ANGIOGRAPHY;  Surgeon: Waynetta Sandy, MD;  Location: [place] [facility];  Service: Cardiovascular;  Laterality: N/A;  . AV FISTULA PLACEMENT Left 06/13/2018   Procedure: ARTERIOVENOUS (AV) FISTULA CREATION;  Surgeon: Rosetta Posner, MD;  Location: [place];  Service: Vascular;  Laterality: Left;  . AV FISTULA PLACEMENT Left 08/18/2018   Procedure: INSERTION OF 4-7MM X 45CM ARTERIOVENOUS (AV) GORE-TEX GRAFT LEFT  FOREARM;  Surgeon: Rosetta Posner, MD;  Location: [place];  Service: Vascular;  Laterality: Left;  . AV FISTULA PLACEMENT Right 04/06/2019   Procedure: INSERTION OF ARTERIOVENOUS (AV) GORE-TEX GRAFT RIGHT UPPER ARM;  Surgeon: Waynetta Sandy, MD;  Location: [place];  Service: Vascular;  Laterality: Right;  . AV FISTULA PLACEMENT Right 04/13/2019   Procedure: INSERTION OF ARTERIOVENOUS (AV) BOVINE  ARTEGRAFT GRAFT RIGHT UPPER EXTREMITY;  Surgeon: Serafina Mitchell, MD;  Location: [place];  Service: Vascular;  Laterality: Right;  . [place] REMOVAL Right 05/16/2019   Procedure: REMOVAL OF ARTERIOVENOUS GORETEX GRAFT ([place]) RIGHT ARM;  Surgeon: Angelia Mould, MD;  Location: [place];  Service: Vascular;  Laterality: Right;  . BASCILIC VEIN TRANSPOSITION Right 03/07/2019   Procedure: First Stage Bascilic  Vein Transposition Right Arm;  Surgeon: Serafina Mitchell, MD;  Location: [facility] OR;  Service: Vascular;  Laterality: Right;  . CATARACT EXTRACTION Right 2005  . INSERTION OF DIALYSIS CATHETER N/A 08/18/2018   Procedure: INSERTION OF DIALYSIS CATHETER;  Surgeon: Rosetta Posner, MD;  Location: [place];  Service: Vascular;  Laterality: N/A;  . INSERTION OF DIALYSIS CATHETER Right 07/25/2019   Procedure: INSERTION OF DIALYSIS CATHETER RIGHT INTERNAL JUGULAR (TUNNELED);  Surgeon: Virl Cagey, MD;  Location: [facility];  Service:  General;  Laterality: Right;  . IR FLUORO GUIDE CV LINE LEFT  10/26/2019  . IR FLUORO GUIDE CV LINE RIGHT  12/06/2018  . IR PTA ADDL CENTRAL DIALYSIS SEG THRU DIALY CIRCUIT RIGHT Right 12/06/2018  . IR REMOVAL TUN CV CATH W/O FL  10/23/2019  . IR US GUIDE VASC ACCESS LEFT  10/26/2019  . TEE WITHOUT CARDIOVERSION N/A 10/26/2019   Procedure: TRANSESOPHAGEAL ECHOCARDIOGRAM (TEE);  Surgeon: Lelon Perla, MD;  Location: [place];  Service: Cardiovascular;  Laterality: N/A;  . THROMBECTOMY AND REVISION OF ARTERIOVENTOUS (AV) GORETEX  GRAFT Left 09/29/2018   Procedure: INSERTION OF ARTERIOVENTOUS (AV) GORETEX  GRAFT ARM;  Surgeon: Waynetta Sandy, MD;  Location: [place];  Service: Vascular;  Laterality: Left;  . UPPER EXTREMITY ANGIOGRAPHY Right 03/28/2019   Procedure: UPPER EXTREMITY ANGIOGRAPHY;  Surgeon: Waynetta Sandy, MD;  Location: [place] [facility];  Service: Cardiovascular;  Laterality: Right;  . VENOGRAM  10/27/2018   Procedure: Venogram;  Surgeon: Marty Heck, MD;  Location: [place] [facility];  Service: Cardiovascular;;  bilateral arm    Social History:  Social History   Socioeconomic History  . Marital status: Single    Spouse name: Not on file  . Number of children: Not on file  . Years of education: Not on file  . Highest education level: Not on file  Occupational History  . Not on file  Tobacco Use  . Smoking status: Never Smoker  . Smokeless tobacco: Never Used  Vaping Use  . Vaping Use: Never used  Substance and Sexual Activity  . Alcohol use: Never  . Drug use: Never  . Sexual activity: Not on file  Other Topics Concern  . Not on file  Social History Narrative  . Not on file   Social Determinants of Health   Financial Resource Strain:   . Difficulty of Paying Living Expenses:   Food Insecurity:   . Worried About Charity fundraiser in the Last Year:   . Arboriculturist in the Last Year:   Transportation Needs:   . Lexicographer (Medical):   Marland Kitchen Lack of Transportation (Non-Medical):   Physical Activity:   . Days of Exercise per Week:   . Minutes of Exercise per Session:   Stress:   . Feeling of Stress :   Social Connections:   . Frequency of Communication with Friends and Family:   . Frequency of Social Gatherings with Friends and Family:   . Attends Religious Services:   . Active Member of Clubs or Organizations:   . Attends Archivist Meetings:   Marland Kitchen Marital Status:   Intimate Partner Violence:   . Fear of Current or Ex-Partner:   . Emotionally Abused:   Marland Kitchen Physically Abused:   . Sexually Abused:     Family History:  Family History  Problem Relation Age of Onset  . Heart murmur Mother   . Heart failure Mother   .  Diabetes Mother   . Cirrhosis Father   . Alcoholism Father     Medications:   Current Outpatient Medications on File Prior to Visit  Medication Sig Dispense Refill  . acetaminophen (TYLENOL) 325 MG tablet Take 2 tablets (650 mg total) by mouth every 12 (twelve) hours as needed for mild pain (or temp > 37.5 C (99.5 F)).    Marland Kitchen apixaban (ELIQUIS) 5 MG TABS tablet Take 1 tablet (5 mg total) by mouth 2 (two) times daily. 60 tablet 1  . carvedilol (COREG) 12.5 MG tablet Take 1 tablet (12.5 mg total) by mouth 2 (two) times daily. 60 tablet 1  . Darbepoetin Alfa (ARANESP) 100 MCG/0.5ML SOSY injection Inject 0.5 mLs (100 mcg total) into the vein every Monday with hemodialysis. 4.2 mL   . diclofenac Sodium (VOLTAREN) 1 % GEL Apply 2 g topically 4 (four) times daily. 300 g 1  . divalproex (DEPAKOTE) 250 MG DR tablet Take 1 tablet (250 mg total) by mouth every 8 (eight) hours. 90 tablet 0  . doxercalciferol (HECTOROL) 4 MCG/2ML injection Inject 0.75 mLs (1.5 mcg total) into the vein every Monday, Wednesday, and Friday with hemodialysis. 2 mL 0  . ferric gluconate 125 mg in sodium chloride 0.9 % 100 mL Inject 125 mg into the vein every Monday.    . gabapentin (NEURONTIN) 300 MG  capsule Take 1 capsule (300 mg total) by mouth daily as needed (nerve pain).    . isosorbide mononitrate (IMDUR) 60 MG 24 hr tablet Take 1 tablet (60 mg total) by mouth daily. 60 tablet 1  . multivitamin (RENA-VIT) TABS tablet Take 1 tablet by mouth daily after lunch. 30 tablet 0  . pantoprazole (PROTONIX) 40 MG tablet Take 1 tablet (40 mg total) by mouth daily. 30 tablet 0   No current facility-administered medications on file prior to visit.    Allergies:  No Known Allergies   Physical Exam  Vitals:   05/06/20 1505  BP: 128/76  Pulse: 96  Weight: 139 lb (63 kg)  Height: 5\' 7"  (1.702 m)   Body mass index is 21.77 kg/m. No exam data present   General: Frail pleasant elderly African-American female, seated, in no apparent distress Head: head normocephalic and atraumatic.   Neck: supple with no carotid or supraclavicular bruits Cardiovascular: regular rate and rhythm, no murmurs Musculoskeletal: no deformity Skin:  no rash/petichiae Vascular:  Normal pulses all extremities   Neurologic Exam Mental Status: Awake and fully alert.   Normal speech and language.  Oriented to place and time. Recent and remote memory intact. Attention span, concentration and fund of knowledge appropriate. Mood and affect appropriate.  Cranial Nerves: Pupils equal, briskly reactive to light. Extraocular movements full without nystagmus. Visual fields full to confrontation. Hearing intact. Facial sensation intact.  Facial symmetry noted. Motor: Normal bulk and tone.  LUE: 5/5 with decreased left hand dexterity  LLE: 4/5 hip flexor; 5/5 distal Full strength right upper and lower extremity Sensory.: intact to touch , pinprick , position and vibratory sensation.  Coordination: Rapid alternating movements normal in all extremities except slightly decreased left hand. Finger-to-nose performed accurately bilaterally and heel-to-shin performed accurately on right side but unable to do on the left side due to  back pain Gait and Station: Deferred as patient nonambulatory Reflexes: 1+ and symmetric. Toes downgoing.          ASSESSMENT: [name] is a 69 y.o. year old female presented with dizziness, left arm weakness and left leg numbness on  10/20/2019 with stroke work-up revealing acute right BG, CR, and caudate scattered infarcts embolic pattern secondary to MV vegetation/endocarditis as evidenced on TEE. Vascular risk factors include acute IJ DVT, right brachial vein age indeterminate DVT, ESRD on HD, recurrent Pseudomonas bacteremia, MV endocarditis, HTN, and history of TIA in 2010.  Residual stroke deficits mild left hemiparesis with ongoing improvement.  Possible partial complex seizure during recent admission for hyperkalemia with reported staring spells, loss of consciousness and confusion with resolution of symptoms on Depakote    PLAN:  1. Embolic stroke: Continue Eliquis for acute IJ DVT for secondary stroke prevention.  Advised ongoing follow-up with cardiology to determine further duration and need of Eliquis.  Maintain strict control of hypertension with blood pressure goal below 130/90, diabetes with hemoglobin A1c goal below 6.5% and cholesterol with LDL cholesterol (bad cholesterol) goal below 70 mg/dL.  I also advised the patient to eat a healthy diet with plenty of whole grains, cereals, fruits and vegetables, exercise regularly with at least 30 minutes of continuous activity daily and maintain ideal body weight. 2. Partial complex seizure: Continuation of Depakote 250 mg twice daily for seizure prophylaxis.  Refill provided. 3. HTN: Advised to continue current treatment regimen. Advised to continue to monitor at home along with continued follow-up with PCP for management 4. DMII: Advised to continue to monitor glucose levels at home along with continued follow-up with PCP for management and monitoring 5. Acute IJ DVT: Remains on Eliquis at this time despite 14-month  recommendation.  Advised to follow-up with cardiology in regards to ongoing use of Eliquis.  If discontinued, would recommend initiating aspirin 81 mg daily for secondary stroke prevention 6. MV endocarditis/vegetation: Continue to follow with cardiology with repeat echocardiogram on 05/09/2020    Follow up in 6 months or call earlier if needed   I spent 35 minutes of face-to-face and non-face-to-face time with patient.  This included previsit chart review, lab review, study review, order entry, electronic health record documentation, patient education    Frann Rider, [facility]  [facility] [address] [place], [place] [address]  Phone [phone] Fax [phone] Note: This document was prepared with digital dictation and possible smart phrase technology. Any transcriptional errors that result from this process are unintentional.

## 2020-05-06 NOTE — Patient Instructions (Signed)
Continue Eliquis (apixaban) daily for secondary stroke prevention  Please ensure follow-up in the near future with cardiology for further recommendations regarding ongoing use of Eliquis - if they discontinue eliquis, would recommend use of aspirin for secondary stroke prevention  Continue to follow up with PCP regarding blood pressure and diabetes management   Continue on depakote 250mg  twice daily for seizure prevention  Maintain strict control of hypertension with blood pressure goal below 130/90, diabetes with hemoglobin A1c goal below 6.5% and cholesterol with LDL cholesterol (bad cholesterol) goal below 70 mg/dL. I also advised the patient to eat a healthy diet with plenty of whole grains, cereals, fruits and vegetables, exercise regularly and maintain ideal body weight.  Followup in the future with me in 6 months or call earlier if needed       Thank you for coming to see [name] at [facility]. I hope we have been able to provide you high quality care today.  You may receive a patient satisfaction survey over the next few weeks. We would appreciate your feedback and comments so that we may continue to improve ourselves and the health of our patients.

## 2020-05-08 DIAGNOSIS — N186 End stage renal disease: Secondary | ICD-10-CM | POA: Diagnosis not present

## 2020-05-08 DIAGNOSIS — Z992 Dependence on renal dialysis: Secondary | ICD-10-CM | POA: Diagnosis not present

## 2020-05-09 ENCOUNTER — Ambulatory Visit (HOSPITAL_COMMUNITY)
Admission: RE | Admit: 2020-05-09 | Discharge: 2020-05-09 | Disposition: A | Discharge status: Home / Self Care | Payer: Medicare PPO | Source: Ambulatory Visit | Attending: Cardiology | Admitting: Cardiology

## 2020-05-09 ENCOUNTER — Other Ambulatory Visit: Payer: Self-pay

## 2020-05-09 DIAGNOSIS — I340 Nonrheumatic mitral (valve) insufficiency: Secondary | ICD-10-CM | POA: Diagnosis not present

## 2020-05-09 NOTE — Progress Notes (Signed)
*  PRELIMINARY RESULTS* Echocardiogram 2D Echocardiogram has been performed.  [name] 05/09/2020, 12:57 PM

## 2020-05-10 DIAGNOSIS — N186 End stage renal disease: Secondary | ICD-10-CM | POA: Diagnosis not present

## 2020-05-10 DIAGNOSIS — Z992 Dependence on renal dialysis: Secondary | ICD-10-CM | POA: Diagnosis not present

## 2020-05-10 NOTE — Progress Notes (Signed)
I agree with the above plan 

## 2020-05-13 DIAGNOSIS — Z992 Dependence on renal dialysis: Secondary | ICD-10-CM | POA: Diagnosis not present

## 2020-05-13 DIAGNOSIS — N186 End stage renal disease: Secondary | ICD-10-CM | POA: Diagnosis not present

## 2020-05-15 DIAGNOSIS — Z992 Dependence on renal dialysis: Secondary | ICD-10-CM | POA: Diagnosis not present

## 2020-05-15 DIAGNOSIS — N186 End stage renal disease: Secondary | ICD-10-CM | POA: Diagnosis not present

## 2020-05-17 DIAGNOSIS — Z992 Dependence on renal dialysis: Secondary | ICD-10-CM | POA: Diagnosis not present

## 2020-05-17 DIAGNOSIS — N186 End stage renal disease: Secondary | ICD-10-CM | POA: Diagnosis not present

## 2020-05-22 DIAGNOSIS — N184 Chronic kidney disease, stage 4 (severe): Secondary | ICD-10-CM | POA: Diagnosis not present

## 2020-05-22 DIAGNOSIS — N186 End stage renal disease: Secondary | ICD-10-CM | POA: Diagnosis not present

## 2020-05-22 DIAGNOSIS — I129 Hypertensive chronic kidney disease with stage 1 through stage 4 chronic kidney disease, or unspecified chronic kidney disease: Secondary | ICD-10-CM | POA: Diagnosis not present

## 2020-05-22 DIAGNOSIS — E1122 Type 2 diabetes mellitus with diabetic chronic kidney disease: Secondary | ICD-10-CM | POA: Diagnosis not present

## 2020-05-22 DIAGNOSIS — Z794 Long term (current) use of insulin: Secondary | ICD-10-CM | POA: Diagnosis not present

## 2020-05-22 DIAGNOSIS — Z992 Dependence on renal dialysis: Secondary | ICD-10-CM | POA: Diagnosis not present

## 2020-05-22 DIAGNOSIS — E119 Type 2 diabetes mellitus without complications: Secondary | ICD-10-CM | POA: Diagnosis not present

## 2020-05-23 DIAGNOSIS — Z992 Dependence on renal dialysis: Secondary | ICD-10-CM | POA: Diagnosis not present

## 2020-05-23 DIAGNOSIS — N186 End stage renal disease: Secondary | ICD-10-CM | POA: Diagnosis not present

## 2020-05-24 DIAGNOSIS — Z992 Dependence on renal dialysis: Secondary | ICD-10-CM | POA: Diagnosis not present

## 2020-05-24 DIAGNOSIS — N186 End stage renal disease: Secondary | ICD-10-CM | POA: Diagnosis not present

## 2020-05-26 DIAGNOSIS — G8190 Hemiplegia, unspecified affecting unspecified side: Secondary | ICD-10-CM | POA: Diagnosis not present

## 2020-05-26 DIAGNOSIS — E1122 Type 2 diabetes mellitus with diabetic chronic kidney disease: Secondary | ICD-10-CM | POA: Diagnosis not present

## 2020-05-26 DIAGNOSIS — R569 Unspecified convulsions: Secondary | ICD-10-CM | POA: Diagnosis not present

## 2020-05-26 DIAGNOSIS — E162 Hypoglycemia, unspecified: Secondary | ICD-10-CM | POA: Diagnosis not present

## 2020-05-26 DIAGNOSIS — N186 End stage renal disease: Secondary | ICD-10-CM | POA: Diagnosis not present

## 2020-05-26 DIAGNOSIS — Z86718 Personal history of other venous thrombosis and embolism: Secondary | ICD-10-CM | POA: Diagnosis not present

## 2020-05-26 DIAGNOSIS — K219 Gastro-esophageal reflux disease without esophagitis: Secondary | ICD-10-CM | POA: Diagnosis not present

## 2020-05-27 DIAGNOSIS — Z992 Dependence on renal dialysis: Secondary | ICD-10-CM | POA: Diagnosis not present

## 2020-05-27 DIAGNOSIS — N186 End stage renal disease: Secondary | ICD-10-CM | POA: Diagnosis not present

## 2020-05-29 DIAGNOSIS — N186 End stage renal disease: Secondary | ICD-10-CM | POA: Diagnosis not present

## 2020-05-29 DIAGNOSIS — Z992 Dependence on renal dialysis: Secondary | ICD-10-CM | POA: Diagnosis not present

## 2020-05-31 ENCOUNTER — Other Ambulatory Visit: Payer: Self-pay

## 2020-05-31 ENCOUNTER — Emergency Department (HOSPITAL_COMMUNITY)
Admission: EM | Admit: 2020-05-31 | Discharge: 2020-05-31 | Disposition: A | Discharge status: Home / Self Care | Payer: Medicare PPO | Attending: Emergency Medicine | Admitting: Emergency Medicine

## 2020-05-31 ENCOUNTER — Encounter (HOSPITAL_COMMUNITY): Payer: Self-pay

## 2020-05-31 ENCOUNTER — Emergency Department (HOSPITAL_COMMUNITY): Payer: Medicare PPO

## 2020-05-31 DIAGNOSIS — I120 Hypertensive chronic kidney disease with stage 5 chronic kidney disease or end stage renal disease: Secondary | ICD-10-CM | POA: Insufficient documentation

## 2020-05-31 DIAGNOSIS — E1122 Type 2 diabetes mellitus with diabetic chronic kidney disease: Secondary | ICD-10-CM | POA: Diagnosis not present

## 2020-05-31 DIAGNOSIS — Z7901 Long term (current) use of anticoagulants: Secondary | ICD-10-CM | POA: Diagnosis not present

## 2020-05-31 DIAGNOSIS — E875 Hyperkalemia: Secondary | ICD-10-CM

## 2020-05-31 DIAGNOSIS — R569 Unspecified convulsions: Secondary | ICD-10-CM

## 2020-05-31 DIAGNOSIS — R5383 Other fatigue: Secondary | ICD-10-CM | POA: Insufficient documentation

## 2020-05-31 DIAGNOSIS — R0789 Other chest pain: Secondary | ICD-10-CM | POA: Diagnosis not present

## 2020-05-31 DIAGNOSIS — R519 Headache, unspecified: Secondary | ICD-10-CM | POA: Insufficient documentation

## 2020-05-31 DIAGNOSIS — R079 Chest pain, unspecified: Secondary | ICD-10-CM | POA: Insufficient documentation

## 2020-05-31 DIAGNOSIS — Z86718 Personal history of other venous thrombosis and embolism: Secondary | ICD-10-CM | POA: Insufficient documentation

## 2020-05-31 DIAGNOSIS — E876 Hypokalemia: Secondary | ICD-10-CM | POA: Diagnosis not present

## 2020-05-31 DIAGNOSIS — Z8673 Personal history of transient ischemic attack (TIA), and cerebral infarction without residual deficits: Secondary | ICD-10-CM | POA: Insufficient documentation

## 2020-05-31 DIAGNOSIS — Z992 Dependence on renal dialysis: Secondary | ICD-10-CM | POA: Diagnosis not present

## 2020-05-31 DIAGNOSIS — N186 End stage renal disease: Secondary | ICD-10-CM | POA: Insufficient documentation

## 2020-05-31 DIAGNOSIS — R112 Nausea with vomiting, unspecified: Secondary | ICD-10-CM | POA: Diagnosis not present

## 2020-05-31 DIAGNOSIS — R0602 Shortness of breath: Secondary | ICD-10-CM | POA: Insufficient documentation

## 2020-05-31 DIAGNOSIS — G40909 Epilepsy, unspecified, not intractable, without status epilepticus: Secondary | ICD-10-CM | POA: Diagnosis not present

## 2020-05-31 DIAGNOSIS — Z79899 Other long term (current) drug therapy: Secondary | ICD-10-CM | POA: Insufficient documentation

## 2020-05-31 LAB — BASIC METABOLIC PANEL
Anion gap: 14 (ref 5–15)
BUN: 26 mg/dL — ABNORMAL HIGH (ref 8–23)
CO2: 27 mmol/L (ref 22–32)
Calcium: 8.6 mg/dL — ABNORMAL LOW (ref 8.9–10.3)
Chloride: 99 mmol/L (ref 98–111)
Creatinine, Ser: 4.63 mg/dL — ABNORMAL HIGH (ref 0.44–1.00)
GFR calc Af Amer: 10 mL/min — ABNORMAL LOW (ref >60)
GFR calc non Af Amer: 9 mL/min — ABNORMAL LOW (ref >60)
Glucose, Bld: 102 mg/dL — ABNORMAL HIGH (ref 70–99)
Potassium: 5.9 mmol/L — ABNORMAL HIGH (ref 3.5–5.1)
Sodium: 140 mmol/L (ref 135–145)

## 2020-05-31 LAB — CBC WITH DIFFERENTIAL/PLATELET
Abs Immature Granulocytes: 0.06 10*3/uL (ref 0.00–0.07)
Basophils Absolute: 0 10*3/uL (ref 0.0–0.1)
Basophils Relative: 1 %
Eosinophils Absolute: 0.1 10*3/uL (ref 0.0–0.5)
Eosinophils Relative: 1 %
HCT: 38.7 % (ref 36.0–46.0)
Hemoglobin: 12 g/dL (ref 12.0–15.0)
Immature Granulocytes: 1 %
Lymphocytes Relative: 21 %
Lymphs Abs: 1.2 10*3/uL (ref 0.7–4.0)
MCH: 28.2 pg (ref 26.0–34.0)
MCHC: 31 g/dL (ref 30.0–36.0)
MCV: 91.1 fL (ref 80.0–100.0)
Monocytes Absolute: 0.4 10*3/uL (ref 0.1–1.0)
Monocytes Relative: 7 %
Neutro Abs: 3.9 10*3/uL (ref 1.7–7.7)
Neutrophils Relative %: 69 %
Platelets: 162 10*3/uL (ref 150–400)
RBC: 4.25 MIL/uL (ref 3.87–5.11)
RDW: 17 % — ABNORMAL HIGH (ref 11.5–15.5)
WBC: 5.6 10*3/uL (ref 4.0–10.5)
nRBC: 0 % (ref 0.0–0.2)

## 2020-05-31 LAB — CBG MONITORING, ED: Glucose-Capillary: 92 mg/dL (ref 70–99)

## 2020-05-31 LAB — TROPONIN I (HIGH SENSITIVITY)
Troponin I (High Sensitivity): 8 ng/L (ref <18)
Troponin I (High Sensitivity): 7 ng/L (ref <18)

## 2020-05-31 LAB — VALPROIC ACID LEVEL: Valproic Acid Lvl: 13 ug/mL — ABNORMAL LOW (ref 50.0–100.0)

## 2020-05-31 MED ORDER — ONDANSETRON HCL 4 MG/2ML IJ SOLN
4.0000 mg | Freq: Once | INTRAMUSCULAR | Status: AC
  Administered 2020-05-31: 4 mg via INTRAVENOUS
  Filled 2020-05-31: qty 2

## 2020-05-31 MED ORDER — SODIUM ZIRCONIUM CYCLOSILICATE 5 G PO PACK
10.0000 g | PACK | Freq: Once | ORAL | Status: AC
  Administered 2020-05-31: 10 g via ORAL
  Filled 2020-05-31: qty 2

## 2020-05-31 MED ORDER — LORAZEPAM 2 MG/ML IJ SOLN
0.5000 mg | INTRAMUSCULAR | Status: DC | PRN
  Administered 2020-05-31: 0.5 mg via INTRAVENOUS
  Filled 2020-05-31: qty 1

## 2020-05-31 MED ORDER — DIVALPROEX SODIUM 250 MG PO DR TAB
500.0000 mg | DELAYED_RELEASE_TABLET | Freq: Once | ORAL | Status: AC
  Administered 2020-05-31: 500 mg via ORAL
  Filled 2020-05-31: qty 2

## 2020-05-31 NOTE — ED Provider Notes (Signed)
[facility] EMERGENCY DEPARTMENT Provider Note   CSN: [id number] Arrival date & time: 05/31/20  1208     History Chief Complaint  Patient presents with  . Seizures    [name] is a 69 y.o. female with a history of diabetes, end-stage renal disease who completed dialysis this morning, GERD, hyperlipidemia, hypertension history of CVA also with new onset of seizures with her first episode occurring early May currently on Depakote, presenting with 2 complaints, the first being a seizure around 1230 today lasting for several minutes per her son at the bedside who witnessed the event.  He describes he was wheeling her out to the vehicle when she cried out, then her head went back and her body stiffened and she had a seizure lasting 1 to 2 minutes, secondly she has complaint of chest pain described as left-sided pressure along with some shortness of breath which started during dialysis this morning, prior to 9:30 AM but is currently resolved.  She is currently fatigued, more so than his typical post dialysis.  She has a mild headache, she was nauseated and vomited x1 after her seizure but does not remain nauseated.  She denies urinary or fecal incontinence, denies abdominal pain, no fevers, chills, no neck pain or stiffness.  She has not missed any doses of her Depakote.  Of note, pt is mostly wheelchair bound but walks short distances with a walker.   The history is provided by the patient and a relative.       Past Medical History:  Diagnosis Date  . Arthritis    hands  . Constipation   . Diabetes mellitus without complication (HCC)    Type II  . ESRD (end stage renal disease) ([place])     M/W/F- Hemodialysis  . GERD (gastroesophageal reflux disease)   . Headache   . Hyperlipidemia   . Hypertension   . Irregular heart rate   . Stroke [facility])    TIA - approx 2010- no residual    Patient Active Problem List   Diagnosis Date Noted  . History of bacterial endocarditis 03/21/2020    . History of embolic stroke 93/81/0175  . ESRD (end stage renal disease) on dialysis ([place]) 03/21/2020  . Mitral valve regurgitation due to infection 01/24/2020  . Acute deep vein thrombosis (DVT) of non-extremity vein   . Greater trochanteric bursitis of left hip   . Subtherapeutic international normalized ratio (INR)   . Pneumatosis coli   . Ogilvie's syndrome   . Decubitus ulcer of sacral region, unstageable ([place])   . Hemodialysis-associated hypotension   . Supratherapeutic INR   . Gastroesophageal reflux disease   . Encephalopathy, hepatic ([place])   . Hyperammonemia ([place])   . Sleep disturbance   . Candidiasis   . Endocarditis of mitral valve   . Septic embolism ([place])   . Seizure ([place])   . Slow transit constipation   . Acute on chronic anemia   . Labile blood pressure   . Labile blood glucose   . Diabetes mellitus type 2 in nonobese (HCC)   . Bacteremia   . Cerebrovascular accident (CVA) of right basal ganglia ([place]) 11/01/2019  . Acute bacterial endocarditis   . Acute left-sided weakness   . Acute ischemic stroke ([place]) 10/20/2019  . Hyperkalemia 09/10/2019  . End-stage renal disease on hemodialysis ([place])   . Nausea vomiting and diarrhea   . Bacteremia due to Pseudomonas 07/20/2019  . ESRD needing dialysis ([place]) 05/15/2019  . Anemia of chronic renal  failure 04/03/2018  . Essential hypertension 04/03/2018  . Type 2 diabetes mellitus with diabetic nephropathy ([place]) 04/03/2018    Past Surgical History:  Procedure Laterality Date  . ABDOMINAL HYSTERECTOMY    . AORTIC ARCH ANGIOGRAPHY N/A 03/28/2019   Procedure: AORTIC ARCH ANGIOGRAPHY;  Surgeon: Waynetta Sandy, MD;  Location: [place] CV LAB;  Service: Cardiovascular;  Laterality: N/A;  . AV FISTULA PLACEMENT Left 06/13/2018   Procedure: ARTERIOVENOUS (AV) FISTULA CREATION;  Surgeon: Rosetta Posner, MD;  Location: [place];  Service: Vascular;  Laterality: Left;  . AV FISTULA PLACEMENT Left 08/18/2018   Procedure:  INSERTION OF 4-7MM X 45CM ARTERIOVENOUS (AV) GORE-TEX GRAFT LEFT  FOREARM;  Surgeon: Rosetta Posner, MD;  Location: [place];  Service: Vascular;  Laterality: Left;  . AV FISTULA PLACEMENT Right 04/06/2019   Procedure: INSERTION OF ARTERIOVENOUS (AV) GORE-TEX GRAFT RIGHT UPPER ARM;  Surgeon: Waynetta Sandy, MD;  Location: [place];  Service: Vascular;  Laterality: Right;  . AV FISTULA PLACEMENT Right 04/13/2019   Procedure: INSERTION OF ARTERIOVENOUS (AV) BOVINE  ARTEGRAFT GRAFT RIGHT UPPER EXTREMITY;  Surgeon: Serafina Mitchell, MD;  Location: [place];  Service: Vascular;  Laterality: Right;  . [place] REMOVAL Right 05/16/2019   Procedure: REMOVAL OF ARTERIOVENOUS GORETEX GRAFT ([place]) RIGHT ARM;  Surgeon: Angelia Mould, MD;  Location: [place];  Service: Vascular;  Laterality: Right;  . BASCILIC VEIN TRANSPOSITION Right 03/07/2019   Procedure: First Stage Bascilic Vein Transposition Right Arm;  Surgeon: Serafina Mitchell, MD;  Location: [place];  Service: Vascular;  Laterality: Right;  . CATARACT EXTRACTION Right 2005  . INSERTION OF DIALYSIS CATHETER N/A 08/18/2018   Procedure: INSERTION OF DIALYSIS CATHETER;  Surgeon: Rosetta Posner, MD;  Location: [place];  Service: Vascular;  Laterality: N/A;  . INSERTION OF DIALYSIS CATHETER Right 07/25/2019   Procedure: INSERTION OF DIALYSIS CATHETER RIGHT INTERNAL JUGULAR (TUNNELED);  Surgeon: Virl Cagey, MD;  Location: [facility];  Service: General;  Laterality: Right;  . IR FLUORO GUIDE CV LINE LEFT  10/26/2019  . IR FLUORO GUIDE CV LINE RIGHT  12/06/2018  . IR PTA ADDL CENTRAL DIALYSIS SEG THRU DIALY CIRCUIT RIGHT Right 12/06/2018  . IR REMOVAL TUN CV CATH W/O FL  10/23/2019  . IR US GUIDE VASC ACCESS LEFT  10/26/2019  . TEE WITHOUT CARDIOVERSION N/A 10/26/2019   Procedure: TRANSESOPHAGEAL ECHOCARDIOGRAM (TEE);  Surgeon: Lelon Perla, MD;  Location: [place];  Service: Cardiovascular;  Laterality: N/A;  . THROMBECTOMY AND REVISION OF ARTERIOVENTOUS  (AV) GORETEX  GRAFT Left 09/29/2018   Procedure: INSERTION OF ARTERIOVENTOUS (AV) GORETEX  GRAFT ARM;  Surgeon: Waynetta Sandy, MD;  Location: [place];  Service: Vascular;  Laterality: Left;  . UPPER EXTREMITY ANGIOGRAPHY Right 03/28/2019   Procedure: UPPER EXTREMITY ANGIOGRAPHY;  Surgeon: Waynetta Sandy, MD;  Location: [place] CV LAB;  Service: Cardiovascular;  Laterality: Right;  . VENOGRAM  10/27/2018   Procedure: Venogram;  Surgeon: Marty Heck, MD;  Location: [place] CV LAB;  Service: Cardiovascular;;  bilateral arm     OB History    Gravida  1   Para  1   Term  1   Preterm      AB      Living        SAB      TAB      Ectopic      Multiple      Live Births  Family History  Problem Relation Age of Onset  . Heart murmur Mother   . Heart failure Mother   . Diabetes Mother   . Cirrhosis Father   . Alcoholism Father     Social History   Tobacco Use  . Smoking status: Never Smoker  . Smokeless tobacco: Never Used  Vaping Use  . Vaping Use: Never used  Substance Use Topics  . Alcohol use: Never  . Drug use: Never    Home Medications Prior to Admission medications   Medication Sig Start Date End Date Taking? Authorizing Provider  acetaminophen (TYLENOL) 325 MG tablet Take 2 tablets (650 mg total) by mouth every 12 (twelve) hours as needed for mild pain (or temp > 37.5 C (99.5 F)). 12/04/19  Yes Angiulli, Lavon Paganini, PA-C  apixaban (ELIQUIS) 5 MG TABS tablet Take 1 tablet (5 mg total) by mouth 2 (two) times daily. 12/28/19  Yes Raulkar, Clide Deutscher, MD  carvedilol (COREG) 12.5 MG tablet Take 1 tablet (12.5 mg total) by mouth 2 (two) times daily. 12/19/19  Yes Raulkar, Clide Deutscher, MD  Darbepoetin Alfa (ARANESP) 100 MCG/0.5ML SOSY injection Inject 0.5 mLs (100 mcg total) into the vein every Monday with hemodialysis. 12/04/19  Yes Angiulli, Lavon Paganini, PA-C  diclofenac Sodium (VOLTAREN) 1 % GEL Apply 2 g topically 4 (four)  times daily. 03/14/20  Yes Raulkar, Clide Deutscher, MD  divalproex (DEPAKOTE) 250 MG DR tablet Take 1 tablet (250 mg total) by mouth 2 (two) times daily. 05/06/20  Yes Frann Rider, NP  doxercalciferol (HECTOROL) 4 MCG/2ML injection Inject 0.75 mLs (1.5 mcg total) into the vein every Monday, Wednesday, and Friday with hemodialysis. 11/01/19  Yes Swayze, Ava, DO  ferric gluconate 125 mg in sodium chloride 0.9 % 100 mL Inject 125 mg into the vein every Monday. 12/04/19  Yes Angiulli, Lavon Paganini, PA-C  gabapentin (NEURONTIN) 300 MG capsule Take 1 capsule (300 mg total) by mouth daily as needed (nerve pain). 04/26/20  Yes Elgergawy, Silver Huguenin, MD  isosorbide mononitrate (IMDUR) 60 MG 24 hr tablet Take 1 tablet (60 mg total) by mouth daily. 12/28/19  Yes Raulkar, Clide Deutscher, MD  multivitamin (RENA-VIT) TABS tablet Take 1 tablet by mouth daily after lunch. 12/04/19  Yes Angiulli, Lavon Paganini, PA-C  pantoprazole (PROTONIX) 40 MG tablet Take 1 tablet (40 mg total) by mouth daily. Patient not taking: Reported on 05/31/2020 12/05/19   Angiulli, Lavon Paganini, PA-C    Allergies    Patient has no known allergies.  Review of Systems   Review of Systems  Constitutional: Positive for fatigue. Negative for chills and fever.  HENT: Negative for congestion and sore throat.   Eyes: Negative.   Respiratory: Positive for shortness of breath. Negative for chest tightness.   Cardiovascular: Positive for chest pain.  Gastrointestinal: Positive for nausea and vomiting. Negative for abdominal pain.  Genitourinary: Negative.   Musculoskeletal: Negative for arthralgias, joint swelling and neck pain.  Skin: Negative.  Negative for rash and wound.  Neurological: Positive for seizures and headaches. Negative for dizziness, weakness, light-headedness and numbness.  Psychiatric/Behavioral: Negative.     Physical Exam Updated Vital Signs BP (!) 148/97   Pulse 96   Temp 97.8 F (36.6 C) (Oral)   Resp 15   Ht 5\' 7"  (1.702 m)   Wt 63 kg    SpO2 100%   BMI 21.77 kg/m   Physical Exam Vitals and nursing note reviewed.  Constitutional:      General: She is not  in acute distress.    Appearance: Normal appearance. She is well-developed.     Comments: Patient is awake and alert but does appear fatigued.  HENT:     Head: Normocephalic and atraumatic.     Mouth/Throat:     Mouth: Mucous membranes are moist.     Pharynx: Oropharynx is clear.  Eyes:     Extraocular Movements: Extraocular movements intact.     Conjunctiva/sclera: Conjunctivae normal.     Pupils: Pupils are equal, round, and reactive to light.  Cardiovascular:     Rate and Rhythm: Normal rate and regular rhythm.     Heart sounds: Normal heart sounds.  Pulmonary:     Effort: Pulmonary effort is normal.     Breath sounds: Normal breath sounds. No wheezing.  Abdominal:     General: Bowel sounds are normal.     Palpations: Abdomen is soft.     Tenderness: There is no abdominal tenderness.  Musculoskeletal:        General: Normal range of motion.     Cervical back: Normal range of motion.  Skin:    General: Skin is warm and dry.  Neurological:     General: No focal deficit present.     Mental Status: She is alert and oriented to person, place, and time.     Cranial Nerves: No cranial nerve deficit.     Sensory: No sensory deficit.  Psychiatric:        Attention and Perception: Attention normal.        Mood and Affect: Mood normal.        Speech: Speech normal.     ED Results / Procedures / Treatments   Labs (all labs ordered are listed, but only abnormal results are displayed) Labs Reviewed  BASIC METABOLIC PANEL - Abnormal; Notable for the following components:      Result Value   Potassium 5.9 (*)    Glucose, Bld 102 (*)    BUN 26 (*)    Creatinine, Ser 4.63 (*)    Calcium 8.6 (*)    GFR calc non Af Amer 9 (*)    GFR calc Af Amer 10 (*)    All other components within normal limits  CBC WITH DIFFERENTIAL/PLATELET - Abnormal; Notable for the  following components:   RDW 17.0 (*)    All other components within normal limits  VALPROIC ACID LEVEL - Abnormal; Notable for the following components:   Valproic Acid Lvl 13 (*)    All other components within normal limits  CBG MONITORING, ED  TROPONIN I (HIGH SENSITIVITY)  TROPONIN I (HIGH SENSITIVITY)    EKG EKG Interpretation  Date/Time:  Friday May 31 2020 13:06:56 EDT Ventricular Rate:  81 PR Interval:    QRS Duration: 82 QT Interval:  394 QTC Calculation: 458 R Axis:   52 Text Interpretation: Sinus rhythm Confirmed by Virgel Manifold [phone]) on 05/31/2020 2:17:56 PM   Radiology DG Chest Port 1 View  Result Date: 05/31/2020 CLINICAL DATA:  Chest pain EXAM: PORTABLE CHEST 1 VIEW COMPARISON:  None. FINDINGS: Lung volumes are small, burr symmetric, and are stable since prior examination. Lungs are clear. No pneumothorax or pleural effusion. Right internal jugular hemodialysis catheter is in place with its tip within the right atrium. Cardiac size within normal limits. No acute bone abnormality. IMPRESSION: No active disease. Electronically Signed   By: Fidela Salisbury MD   On: 05/31/2020 14:52    Procedures Procedures (including critical care time)  Medications  Ordered in ED Medications  divalproex (DEPAKOTE) DR tablet 500 mg (500 mg Oral Given 05/31/20 1650)  sodium zirconium cyclosilicate (LOKELMA) packet 10 g (10 g Oral Given 05/31/20 1650)  ondansetron (ZOFRAN) injection 4 mg (4 mg Intravenous Given 05/31/20 1650)    ED Course  I have reviewed the triage vital signs and the nursing notes.  Pertinent labs & imaging results that were available during my care of the patient were reviewed by me and considered in my medical decision making (see chart for details).    MDM Rules/Calculators/A&P                          Pt with seizure prior to arrival, after dialysis.  New onset since early May, currently on depakote.  Per review of chart, had normal EEG, felt to be partial  complex seizures.  Her depakote level is subtherapeutic, discussed with pharmacy - this medicine level should not be affected by dialysis.  She was prescribed 250 mg q 8 hours, but is only taking bid.  Advised to increase her dosing to tid.  She is hyperkalemic today with potassium of 5.9. no sig ekg changes.  She was given a dose of Lokelma here for tx of this elevated level.  Pt is awake, alert, feeling improved and ready to be discharged home.  Son at bedside aware and also agrees with plan.  Return precautions outlined.    The patient appears reasonably screened and/or stabilized for discharge and I doubt any other medical condition or other [facility] requiring further screening, evaluation, or treatment in the ED at this time prior to discharge.  Final Clinical Impression(s) / ED Diagnoses Final diagnoses:  Seizure [facility])  Hyperkalemia    Rx / DC Orders ED Discharge Orders    None       Landis Martins 06/01/20 0049    Virgel Manifold, MD 06/01/20 [phone]

## 2020-05-31 NOTE — ED Notes (Signed)
Pt called out asking for an emesis bag because of nausea; ativan given per order

## 2020-05-31 NOTE — Discharge Instructions (Addendum)
It is recommended that you increase your Depakote medication taking 1 tablet 3 times daily, every 8 hours.  Follow-up with your primary doctor as you are planning.  Return here if you have any new or worsening symptoms in the interim.  Your potassium level was mildly elevated today and you were given a medication that should resolve this problem.

## 2020-05-31 NOTE — ED Triage Notes (Signed)
Pt to er, son states that pt has a seizure today after dialysis.  States that she had a normal run today.  States that her seizure lasted about one minute.  States that she has had this after dialysis before.

## 2020-06-03 DIAGNOSIS — N186 End stage renal disease: Secondary | ICD-10-CM | POA: Diagnosis not present

## 2020-06-03 DIAGNOSIS — Z992 Dependence on renal dialysis: Secondary | ICD-10-CM | POA: Diagnosis not present

## 2020-06-04 DIAGNOSIS — G8194 Hemiplegia, unspecified affecting left nondominant side: Secondary | ICD-10-CM | POA: Diagnosis not present

## 2020-06-04 DIAGNOSIS — I330 Acute and subacute infective endocarditis: Secondary | ICD-10-CM | POA: Diagnosis not present

## 2020-06-04 DIAGNOSIS — I639 Cerebral infarction, unspecified: Secondary | ICD-10-CM | POA: Diagnosis not present

## 2020-06-04 DIAGNOSIS — N186 End stage renal disease: Secondary | ICD-10-CM | POA: Diagnosis not present

## 2020-06-05 DIAGNOSIS — Z992 Dependence on renal dialysis: Secondary | ICD-10-CM | POA: Diagnosis not present

## 2020-06-05 DIAGNOSIS — N186 End stage renal disease: Secondary | ICD-10-CM | POA: Diagnosis not present

## 2020-06-07 DIAGNOSIS — Z992 Dependence on renal dialysis: Secondary | ICD-10-CM | POA: Diagnosis not present

## 2020-06-07 DIAGNOSIS — N186 End stage renal disease: Secondary | ICD-10-CM | POA: Diagnosis not present

## 2020-06-10 DIAGNOSIS — N186 End stage renal disease: Secondary | ICD-10-CM | POA: Diagnosis not present

## 2020-06-10 DIAGNOSIS — Z992 Dependence on renal dialysis: Secondary | ICD-10-CM | POA: Diagnosis not present

## 2020-06-12 DIAGNOSIS — N186 End stage renal disease: Secondary | ICD-10-CM | POA: Diagnosis not present

## 2020-06-12 DIAGNOSIS — Z992 Dependence on renal dialysis: Secondary | ICD-10-CM | POA: Diagnosis not present

## 2020-06-14 DIAGNOSIS — Z992 Dependence on renal dialysis: Secondary | ICD-10-CM | POA: Diagnosis not present

## 2020-06-14 DIAGNOSIS — Z794 Long term (current) use of insulin: Secondary | ICD-10-CM | POA: Diagnosis not present

## 2020-06-14 DIAGNOSIS — E109 Type 1 diabetes mellitus without complications: Secondary | ICD-10-CM | POA: Diagnosis not present

## 2020-06-14 DIAGNOSIS — H524 Presbyopia: Secondary | ICD-10-CM | POA: Diagnosis not present

## 2020-06-14 DIAGNOSIS — Z961 Presence of intraocular lens: Secondary | ICD-10-CM | POA: Diagnosis not present

## 2020-06-14 DIAGNOSIS — H25812 Combined forms of age-related cataract, left eye: Secondary | ICD-10-CM | POA: Diagnosis not present

## 2020-06-14 DIAGNOSIS — N186 End stage renal disease: Secondary | ICD-10-CM | POA: Diagnosis not present

## 2020-06-17 DIAGNOSIS — Z992 Dependence on renal dialysis: Secondary | ICD-10-CM | POA: Diagnosis not present

## 2020-06-17 DIAGNOSIS — N186 End stage renal disease: Secondary | ICD-10-CM | POA: Diagnosis not present

## 2020-06-17 DIAGNOSIS — Z6823 Body mass index (BMI) 23.0-23.9, adult: Secondary | ICD-10-CM | POA: Diagnosis not present

## 2020-06-17 DIAGNOSIS — R569 Unspecified convulsions: Secondary | ICD-10-CM | POA: Diagnosis not present

## 2020-06-17 DIAGNOSIS — R079 Chest pain, unspecified: Secondary | ICD-10-CM | POA: Diagnosis not present

## 2020-06-17 DIAGNOSIS — R55 Syncope and collapse: Secondary | ICD-10-CM | POA: Diagnosis not present

## 2020-06-19 DIAGNOSIS — Z992 Dependence on renal dialysis: Secondary | ICD-10-CM | POA: Diagnosis not present

## 2020-06-19 DIAGNOSIS — N186 End stage renal disease: Secondary | ICD-10-CM | POA: Diagnosis not present

## 2020-06-21 DIAGNOSIS — K219 Gastro-esophageal reflux disease without esophagitis: Secondary | ICD-10-CM | POA: Diagnosis not present

## 2020-06-21 DIAGNOSIS — E1122 Type 2 diabetes mellitus with diabetic chronic kidney disease: Secondary | ICD-10-CM | POA: Diagnosis not present

## 2020-06-21 DIAGNOSIS — E7801 Familial hypercholesterolemia: Secondary | ICD-10-CM | POA: Diagnosis not present

## 2020-06-21 DIAGNOSIS — D638 Anemia in other chronic diseases classified elsewhere: Secondary | ICD-10-CM | POA: Diagnosis not present

## 2020-06-21 DIAGNOSIS — N186 End stage renal disease: Secondary | ICD-10-CM | POA: Diagnosis not present

## 2020-06-21 DIAGNOSIS — R55 Syncope and collapse: Secondary | ICD-10-CM | POA: Diagnosis not present

## 2020-06-21 DIAGNOSIS — Z992 Dependence on renal dialysis: Secondary | ICD-10-CM | POA: Diagnosis not present

## 2020-06-21 DIAGNOSIS — K2100 Gastro-esophageal reflux disease with esophagitis, without bleeding: Secondary | ICD-10-CM | POA: Diagnosis not present

## 2020-06-21 DIAGNOSIS — N184 Chronic kidney disease, stage 4 (severe): Secondary | ICD-10-CM | POA: Diagnosis not present

## 2020-06-21 DIAGNOSIS — I10 Essential (primary) hypertension: Secondary | ICD-10-CM | POA: Diagnosis not present

## 2020-06-22 DIAGNOSIS — N186 End stage renal disease: Secondary | ICD-10-CM | POA: Diagnosis not present

## 2020-06-22 DIAGNOSIS — Z992 Dependence on renal dialysis: Secondary | ICD-10-CM | POA: Diagnosis not present

## 2020-06-24 DIAGNOSIS — N186 End stage renal disease: Secondary | ICD-10-CM | POA: Diagnosis not present

## 2020-06-24 DIAGNOSIS — Z992 Dependence on renal dialysis: Secondary | ICD-10-CM | POA: Diagnosis not present

## 2020-06-25 DIAGNOSIS — H4312 Vitreous hemorrhage, left eye: Secondary | ICD-10-CM | POA: Diagnosis not present

## 2020-06-25 DIAGNOSIS — E113391 Type 2 diabetes mellitus with moderate nonproliferative diabetic retinopathy without macular edema, right eye: Secondary | ICD-10-CM | POA: Diagnosis not present

## 2020-06-25 DIAGNOSIS — Z01818 Encounter for other preprocedural examination: Secondary | ICD-10-CM | POA: Diagnosis not present

## 2020-06-25 DIAGNOSIS — Z961 Presence of intraocular lens: Secondary | ICD-10-CM | POA: Diagnosis not present

## 2020-06-25 DIAGNOSIS — H268 Other specified cataract: Secondary | ICD-10-CM | POA: Diagnosis not present

## 2020-06-25 NOTE — Progress Notes (Signed)
Forms from [facility] were reviewed today and will be sent to [facility] to be filled out.

## 2020-06-26 DIAGNOSIS — Z992 Dependence on renal dialysis: Secondary | ICD-10-CM | POA: Diagnosis not present

## 2020-06-26 DIAGNOSIS — N186 End stage renal disease: Secondary | ICD-10-CM | POA: Diagnosis not present

## 2020-06-28 DIAGNOSIS — N186 End stage renal disease: Secondary | ICD-10-CM | POA: Diagnosis not present

## 2020-06-28 DIAGNOSIS — Z992 Dependence on renal dialysis: Secondary | ICD-10-CM | POA: Diagnosis not present

## 2020-07-01 ENCOUNTER — Telehealth: Payer: Self-pay

## 2020-07-01 DIAGNOSIS — N186 End stage renal disease: Secondary | ICD-10-CM | POA: Diagnosis not present

## 2020-07-01 DIAGNOSIS — Z992 Dependence on renal dialysis: Secondary | ICD-10-CM | POA: Diagnosis not present

## 2020-07-01 NOTE — Telephone Encounter (Signed)
   Primary Cardiologist: Buford Dresser, MD  Chart reviewed as part of pre-operative protocol coverage. Cataract extractions are recognized in guidelines as low risk surgeries that do not typically require specific preoperative testing or holding of blood thinner therapy. Therefore, given past medical history and time since last visit, based on ACC/AHA guidelines, Davi Rotan would be at acceptable risk for the planned procedure without further cardiovascular testing.   I will route this recommendation to the requesting party via Epic fax function and remove from pre-op pool.  Please call with questions.  Deberah Pelton, NP 07/01/2020, 12:11 PM

## 2020-07-01 NOTE — Telephone Encounter (Signed)
   [facility] Pre-operative Risk Assessment     Request for surgical clearance:  1. What type of surgery is being performed? Cataract Extraction By PE, IOL,-Left   2. When is this surgery scheduled? 08/01/2020  3. What type of clearance is required (medical clearance vs. Pharmacy clearance to hold med vs. Both)? Both  4. Are there any medications that need to be held prior to surgery and how long? Patient is currently on Eliquis- per requesting office no need to hold  5. Practice name and name of physician performing surgery? [facility] Dr. Marca Ancona. [place]  6. What is the office phone number? [phone] Ext: [phone]   2.   What is the office fax number? [phone]  8.   Anesthesia type (None, local, MAC, general) ? IV sedation   Cara Thaxton B Cloud Graham 07/01/2020, 11:56 AM  _________________________________________________________________   (provider comments below)

## 2020-07-02 NOTE — Progress Notes (Signed)
Clearance form faxed to [phone] with fax confirmation received.   (then to MR)

## 2020-07-03 DIAGNOSIS — N186 End stage renal disease: Secondary | ICD-10-CM | POA: Diagnosis not present

## 2020-07-03 DIAGNOSIS — Z992 Dependence on renal dialysis: Secondary | ICD-10-CM | POA: Diagnosis not present

## 2020-07-05 DIAGNOSIS — I639 Cerebral infarction, unspecified: Secondary | ICD-10-CM | POA: Diagnosis not present

## 2020-07-05 DIAGNOSIS — Z992 Dependence on renal dialysis: Secondary | ICD-10-CM | POA: Diagnosis not present

## 2020-07-05 DIAGNOSIS — N186 End stage renal disease: Secondary | ICD-10-CM | POA: Diagnosis not present

## 2020-07-05 DIAGNOSIS — G8194 Hemiplegia, unspecified affecting left nondominant side: Secondary | ICD-10-CM | POA: Diagnosis not present

## 2020-07-05 DIAGNOSIS — I330 Acute and subacute infective endocarditis: Secondary | ICD-10-CM | POA: Diagnosis not present

## 2020-07-08 DIAGNOSIS — N186 End stage renal disease: Secondary | ICD-10-CM | POA: Diagnosis not present

## 2020-07-08 DIAGNOSIS — Z992 Dependence on renal dialysis: Secondary | ICD-10-CM | POA: Diagnosis not present

## 2020-07-10 DIAGNOSIS — N186 End stage renal disease: Secondary | ICD-10-CM | POA: Diagnosis not present

## 2020-07-10 DIAGNOSIS — Z992 Dependence on renal dialysis: Secondary | ICD-10-CM | POA: Diagnosis not present

## 2020-07-12 DIAGNOSIS — N186 End stage renal disease: Secondary | ICD-10-CM | POA: Diagnosis not present

## 2020-07-12 DIAGNOSIS — Z992 Dependence on renal dialysis: Secondary | ICD-10-CM | POA: Diagnosis not present

## 2020-07-15 DIAGNOSIS — N186 End stage renal disease: Secondary | ICD-10-CM | POA: Diagnosis not present

## 2020-07-15 DIAGNOSIS — Z992 Dependence on renal dialysis: Secondary | ICD-10-CM | POA: Diagnosis not present

## 2020-07-17 ENCOUNTER — Ambulatory Visit: Payer: Medicare PPO | Admitting: Cardiothoracic Surgery

## 2020-07-17 ENCOUNTER — Other Ambulatory Visit: Payer: Self-pay

## 2020-07-17 ENCOUNTER — Encounter: Payer: Self-pay | Admitting: Cardiothoracic Surgery

## 2020-07-17 VITALS — BP 87/58 | HR 100 | Temp 97.7°F | Resp 16 | Ht 67.0 in | Wt 147.4 lb

## 2020-07-17 DIAGNOSIS — I340 Nonrheumatic mitral (valve) insufficiency: Secondary | ICD-10-CM | POA: Diagnosis not present

## 2020-07-17 DIAGNOSIS — Z8679 Personal history of other diseases of the circulatory system: Secondary | ICD-10-CM | POA: Diagnosis not present

## 2020-07-17 DIAGNOSIS — B999 Unspecified infectious disease: Secondary | ICD-10-CM

## 2020-07-17 DIAGNOSIS — N186 End stage renal disease: Secondary | ICD-10-CM | POA: Diagnosis not present

## 2020-07-17 DIAGNOSIS — Z992 Dependence on renal dialysis: Secondary | ICD-10-CM

## 2020-07-17 NOTE — Progress Notes (Signed)
PCP is Sasser, Silvestre Moment, MD Referring Provider is Karie Kirks, DO  Chief Complaint  Patient presents with  . Mitral Regurgitation    f/u endocarditis, echocardiogram 05/09/20, ambulating with aide of a walker    HPI: Patient presents for post hospital visit and follow-up echocardiogram.  December 2020 she developed Pseudomonas bacteremia, mitral valve endocarditis and had moderate mitral regurgitation.  She is on chronic hemodialysis and a percutaneous catheter was felt to be the source of the bacteremia.  She was treated with a course of IV antibiotics and clinically improved.  She had a follow-up echocardiogram in June, 6 months after finishing her antibiotics and I personally reviewed the images.  There is now only trace mitral vegetation and vegetations have resolved.  Overall biventricular function is satisfactory.  Clinically the patient is feeling better.  She uses a rolling walker.  Her weight is stable or slightly positive.  She denies shortness of breath or edema.  She is in sinus rhythm.   Past Medical History:  Diagnosis Date  . Arthritis    hands  . Constipation   . Diabetes mellitus without complication (HCC)    Type II  . ESRD (end stage renal disease) ([place])     M/W/F- Hemodialysis  . GERD (gastroesophageal reflux disease)   . Headache   . Hyperlipidemia   . Hypertension   . Irregular heart rate   . Stroke [facility])    TIA - approx 2010- no residual    Past Surgical History:  Procedure Laterality Date  . ABDOMINAL HYSTERECTOMY    . AORTIC ARCH ANGIOGRAPHY N/A 03/28/2019   Procedure: AORTIC ARCH ANGIOGRAPHY;  Surgeon: Waynetta Sandy, MD;  Location: [place] [facility];  Service: Cardiovascular;  Laterality: N/A;  . AV FISTULA PLACEMENT Left 06/13/2018   Procedure: ARTERIOVENOUS (AV) FISTULA CREATION;  Surgeon: Rosetta Posner, MD;  Location: [place];  Service: Vascular;  Laterality: Left;  . AV FISTULA PLACEMENT Left 08/18/2018   Procedure: INSERTION OF 4-7MM X 45CM  ARTERIOVENOUS (AV) GORE-TEX GRAFT LEFT  FOREARM;  Surgeon: Rosetta Posner, MD;  Location: [place];  Service: Vascular;  Laterality: Left;  . AV FISTULA PLACEMENT Right 04/06/2019   Procedure: INSERTION OF ARTERIOVENOUS (AV) GORE-TEX GRAFT RIGHT UPPER ARM;  Surgeon: Waynetta Sandy, MD;  Location: [place];  Service: Vascular;  Laterality: Right;  . AV FISTULA PLACEMENT Right 04/13/2019   Procedure: INSERTION OF ARTERIOVENOUS (AV) BOVINE  ARTEGRAFT GRAFT RIGHT UPPER EXTREMITY;  Surgeon: Serafina Mitchell, MD;  Location: [place];  Service: Vascular;  Laterality: Right;  . [place] REMOVAL Right 05/16/2019   Procedure: REMOVAL OF ARTERIOVENOUS GORETEX GRAFT ([place]) RIGHT ARM;  Surgeon: Angelia Mould, MD;  Location: [place];  Service: Vascular;  Laterality: Right;  . BASCILIC VEIN TRANSPOSITION Right 03/07/2019   Procedure: First Stage Bascilic Vein Transposition Right Arm;  Surgeon: Serafina Mitchell, MD;  Location: [place];  Service: Vascular;  Laterality: Right;  . CATARACT EXTRACTION Right 2005  . INSERTION OF DIALYSIS CATHETER N/A 08/18/2018   Procedure: INSERTION OF DIALYSIS CATHETER;  Surgeon: Rosetta Posner, MD;  Location: [place];  Service: Vascular;  Laterality: N/A;  . INSERTION OF DIALYSIS CATHETER Right 07/25/2019   Procedure: INSERTION OF DIALYSIS CATHETER RIGHT INTERNAL JUGULAR (TUNNELED);  Surgeon: Virl Cagey, MD;  Location: [facility];  Service: General;  Laterality: Right;  . IR FLUORO GUIDE CV LINE LEFT  10/26/2019  . IR FLUORO GUIDE CV LINE RIGHT  12/06/2018  .  IR PTA ADDL CENTRAL DIALYSIS SEG THRU DIALY CIRCUIT RIGHT Right 12/06/2018  . IR REMOVAL TUN CV CATH W/O FL  10/23/2019  . IR US GUIDE VASC ACCESS LEFT  10/26/2019  . TEE WITHOUT CARDIOVERSION N/A 10/26/2019   Procedure: TRANSESOPHAGEAL ECHOCARDIOGRAM (TEE);  Surgeon: Lelon Perla, MD;  Location: [place];  Service: Cardiovascular;  Laterality: N/A;  . THROMBECTOMY AND REVISION OF ARTERIOVENTOUS (AV) GORETEX  GRAFT Left  09/29/2018   Procedure: INSERTION OF ARTERIOVENTOUS (AV) GORETEX  GRAFT ARM;  Surgeon: Waynetta Sandy, MD;  Location: [place];  Service: Vascular;  Laterality: Left;  . UPPER EXTREMITY ANGIOGRAPHY Right 03/28/2019   Procedure: UPPER EXTREMITY ANGIOGRAPHY;  Surgeon: Waynetta Sandy, MD;  Location: [place] [facility];  Service: Cardiovascular;  Laterality: Right;  . VENOGRAM  10/27/2018   Procedure: Venogram;  Surgeon: Marty Heck, MD;  Location: [place] [facility];  Service: Cardiovascular;;  bilateral arm    Family History  Problem Relation Age of Onset  . Heart murmur Mother   . Heart failure Mother   . Diabetes Mother   . Cirrhosis Father   . Alcoholism Father     Social History Social History   Tobacco Use  . Smoking status: Never Smoker  . Smokeless tobacco: Never Used  Vaping Use  . Vaping Use: Never used  Substance Use Topics  . Alcohol use: Never  . Drug use: Never    Current Outpatient Medications  Medication Sig Dispense Refill  . acetaminophen (TYLENOL) 325 MG tablet Take 2 tablets (650 mg total) by mouth every 12 (twelve) hours as needed for mild pain (or temp > 37.5 C (99.5 F)).    Marland Kitchen apixaban (ELIQUIS) 5 MG TABS tablet Take 1 tablet (5 mg total) by mouth 2 (two) times daily. 60 tablet 1  . carvedilol (COREG) 12.5 MG tablet Take 1 tablet (12.5 mg total) by mouth 2 (two) times daily. 60 tablet 1  . Darbepoetin Alfa (ARANESP) 100 MCG/0.5ML SOSY injection Inject 0.5 mLs (100 mcg total) into the vein every Monday with hemodialysis. 4.2 mL   . diclofenac Sodium (VOLTAREN) 1 % GEL Apply 2 g topically 4 (four) times daily. 300 g 1  . divalproex (DEPAKOTE) 250 MG DR tablet Take 1 tablet (250 mg total) by mouth 2 (two) times daily. 180 tablet 3  . doxercalciferol (HECTOROL) 4 MCG/2ML injection Inject 0.75 mLs (1.5 mcg total) into the vein every Monday, Wednesday, and Friday with hemodialysis. 2 mL 0  . ferric gluconate 125 mg in sodium chloride 0.9 %  100 mL Inject 125 mg into the vein every Monday.    . gabapentin (NEURONTIN) 300 MG capsule Take 1 capsule (300 mg total) by mouth daily as needed (nerve pain).    . isosorbide mononitrate (IMDUR) 60 MG 24 hr tablet Take 1 tablet (60 mg total) by mouth daily. 60 tablet 1  . multivitamin (RENA-VIT) TABS tablet Take 1 tablet by mouth daily after lunch. 30 tablet 0  . pantoprazole (PROTONIX) 40 MG tablet Take 1 tablet (40 mg total) by mouth daily. (Patient not taking: Reported on 05/31/2020) 30 tablet 0   No current facility-administered medications for this visit.    No Known Allergies  Review of Systems   Patient states she has cephalic diaphoresis and eye watering when she eats. No bleeding problems from her Eliquis No cough no shortness of breath loss of taste or smell No ankle edema  BP (!) 87/58 (BP Location: Right Arm, Patient Position:  Sitting, Cuff Size: Normal) Comment: HAD DIALYSIS TODAY  Pulse 100   Temp 97.7 F (36.5 C)   Resp 16   Ht 5\' 7"  (1.702 m)   Wt 147 lb 6.4 oz (66.9 kg)   SpO2 99% Comment: RA  BMI 23.09 kg/m  Physical Exam Chronically ill-appearing female but alert responsive and appropriate Lungs clear Heart rate regular without murmur Hemodialysis catheter secured in chest wall Abdomen nontender nondistended Walks independently with a rolling walker  Diagnostic Tests: Echocardiogram performed June 2021 shows resolution of mitral valve endocarditis, resolution of mitral valve regurgitation  Impression: Patient has resolved her endocarditis and would not benefit from surgery.  She is encouraged to practice good oral hygiene of her remaining lower teeth.  Plan: Evaluate surgical issues as needed.  Len Childs, MD [facility] [phone]

## 2020-07-19 DIAGNOSIS — Z992 Dependence on renal dialysis: Secondary | ICD-10-CM | POA: Diagnosis not present

## 2020-07-19 DIAGNOSIS — N186 End stage renal disease: Secondary | ICD-10-CM | POA: Diagnosis not present

## 2020-07-22 DIAGNOSIS — Z992 Dependence on renal dialysis: Secondary | ICD-10-CM | POA: Diagnosis not present

## 2020-07-22 DIAGNOSIS — N186 End stage renal disease: Secondary | ICD-10-CM | POA: Diagnosis not present

## 2020-07-23 DIAGNOSIS — I129 Hypertensive chronic kidney disease with stage 1 through stage 4 chronic kidney disease, or unspecified chronic kidney disease: Secondary | ICD-10-CM | POA: Diagnosis not present

## 2020-07-23 DIAGNOSIS — Z992 Dependence on renal dialysis: Secondary | ICD-10-CM | POA: Diagnosis not present

## 2020-07-23 DIAGNOSIS — N184 Chronic kidney disease, stage 4 (severe): Secondary | ICD-10-CM | POA: Diagnosis not present

## 2020-07-23 DIAGNOSIS — N186 End stage renal disease: Secondary | ICD-10-CM | POA: Diagnosis not present

## 2020-07-23 DIAGNOSIS — E1122 Type 2 diabetes mellitus with diabetic chronic kidney disease: Secondary | ICD-10-CM | POA: Diagnosis not present

## 2020-07-24 DIAGNOSIS — N186 End stage renal disease: Secondary | ICD-10-CM | POA: Diagnosis not present

## 2020-07-24 DIAGNOSIS — Z992 Dependence on renal dialysis: Secondary | ICD-10-CM | POA: Diagnosis not present

## 2020-07-26 DIAGNOSIS — Z992 Dependence on renal dialysis: Secondary | ICD-10-CM | POA: Diagnosis not present

## 2020-07-26 DIAGNOSIS — N186 End stage renal disease: Secondary | ICD-10-CM | POA: Diagnosis not present

## 2020-07-29 DIAGNOSIS — N186 End stage renal disease: Secondary | ICD-10-CM | POA: Diagnosis not present

## 2020-07-29 DIAGNOSIS — Z992 Dependence on renal dialysis: Secondary | ICD-10-CM | POA: Diagnosis not present

## 2020-07-31 DIAGNOSIS — N186 End stage renal disease: Secondary | ICD-10-CM | POA: Diagnosis not present

## 2020-07-31 DIAGNOSIS — Z992 Dependence on renal dialysis: Secondary | ICD-10-CM | POA: Diagnosis not present

## 2020-08-01 DIAGNOSIS — H268 Other specified cataract: Secondary | ICD-10-CM | POA: Diagnosis not present

## 2020-08-01 DIAGNOSIS — H2512 Age-related nuclear cataract, left eye: Secondary | ICD-10-CM | POA: Diagnosis not present

## 2020-08-01 IMAGING — CT CT RENAL STONE PROTOCOL
2 of 4 series · 14 of 46 positions shown, 16 images · non-contrast
Comparison: None

CLINICAL DATA: Flank pain, also recent fall

EXAM:
CT ABDOMEN AND PELVIS WITHOUT CONTRAST
TECHNIQUE: Multidetector CT imaging of the abdomen and pelvis was performed
following the standard protocol without IV contrast.

[Series 2: axial st · axial · 0.80mm/px · z∈[-742,-332]mm · 11 of 94 slices shown, 13 images]
[im 6/94  soft-tissue]
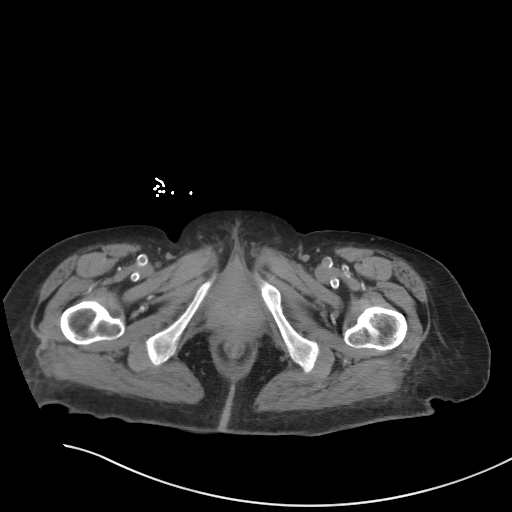
[im 6/94  bone]
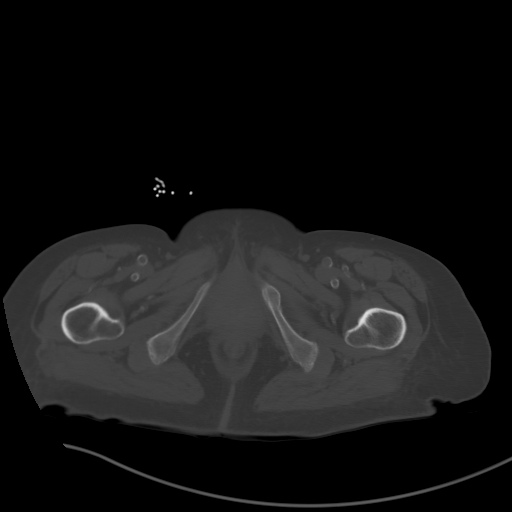
[im 17/94  soft-tissue]
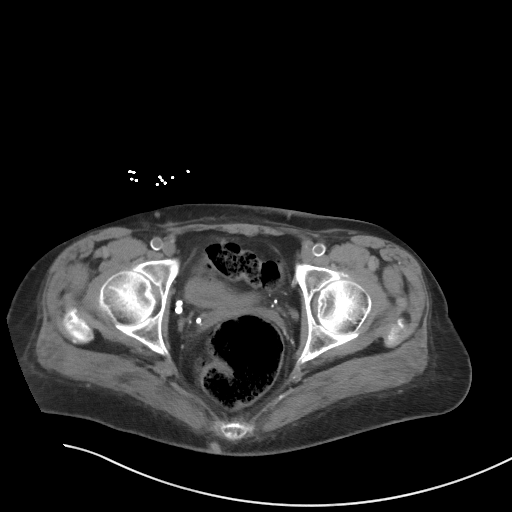
[im 22/94  soft-tissue]
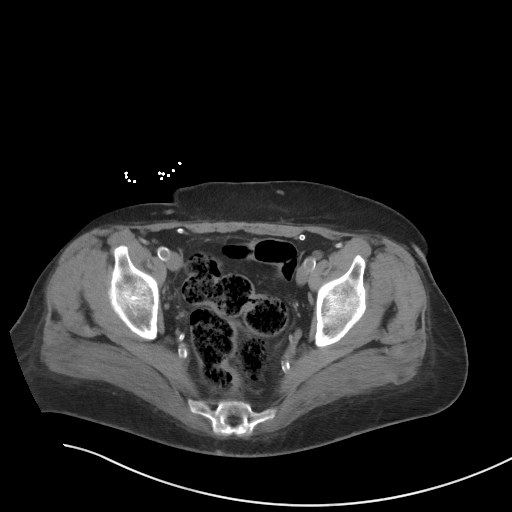
[im 33/94  soft-tissue]
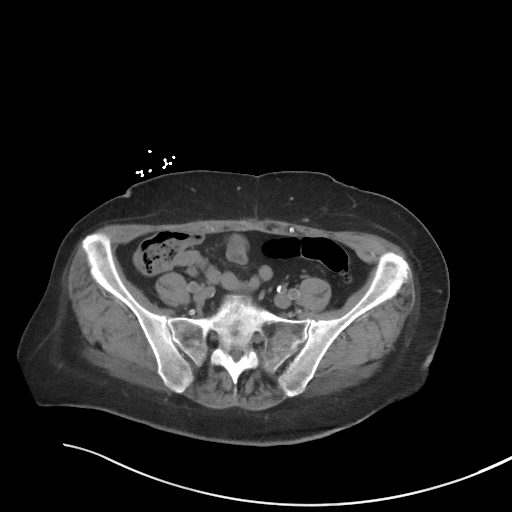
[im 39/94  soft-tissue]
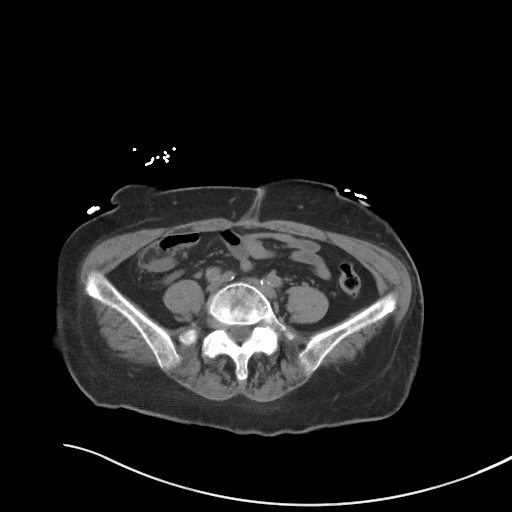
[im 50/94  soft-tissue]
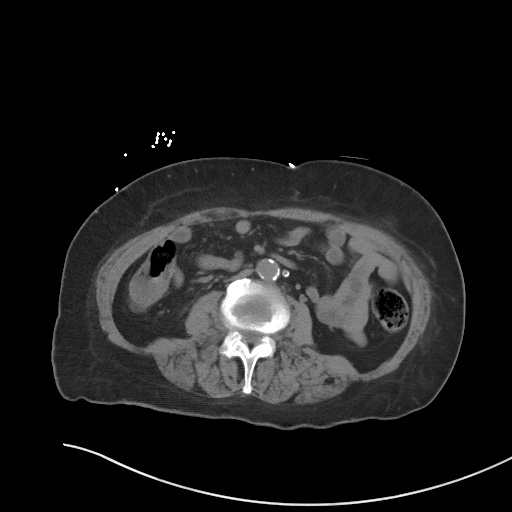
[im 55/94  soft-tissue]
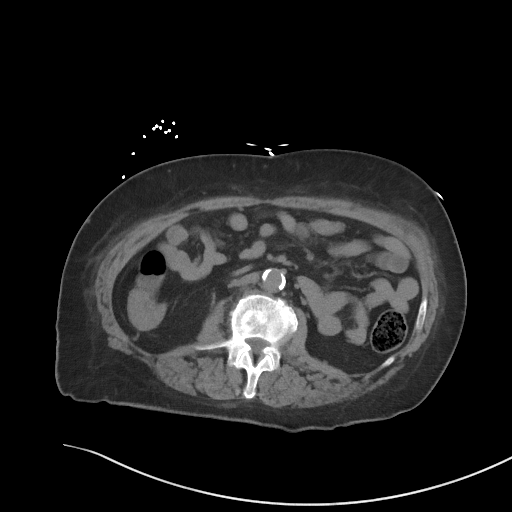
[im 61/94  soft-tissue]
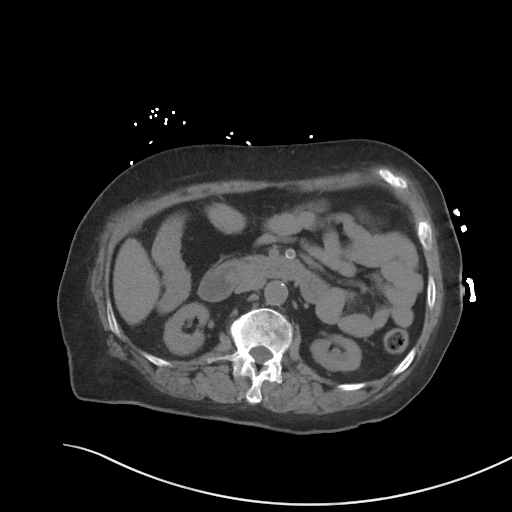
[im 72/94  soft-tissue]
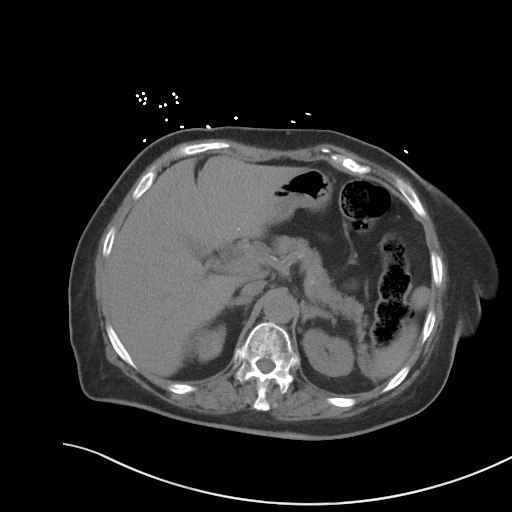
[im 72/94  bone]
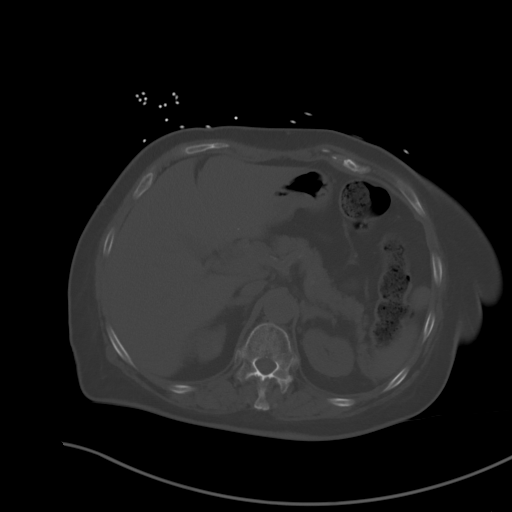
[im 77/94  soft-tissue]
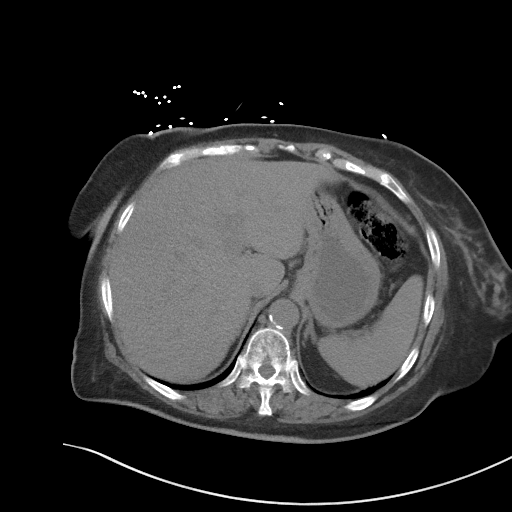
[im 88/94  soft-tissue]
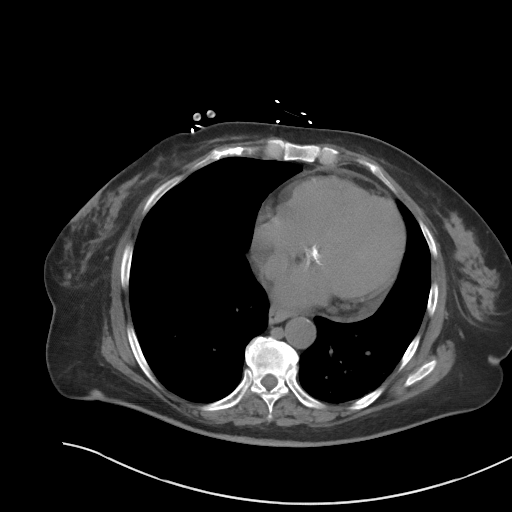

[Series 5: coronal st · coronal · 0.78mm/px · 3 of 91 slices shown]
[im 31/91  soft-tissue]
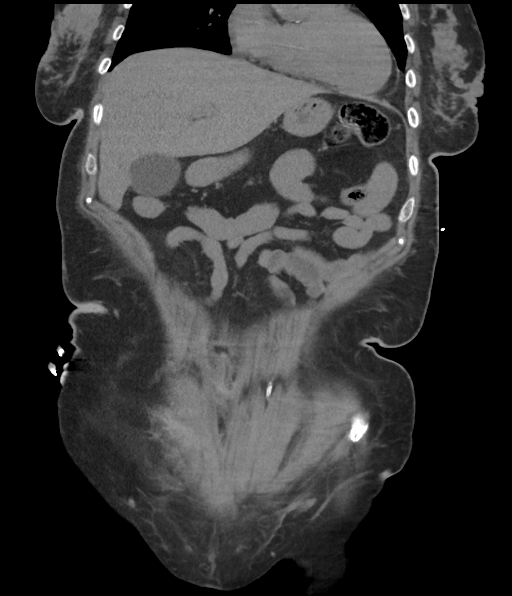
[im 41/91  soft-tissue]
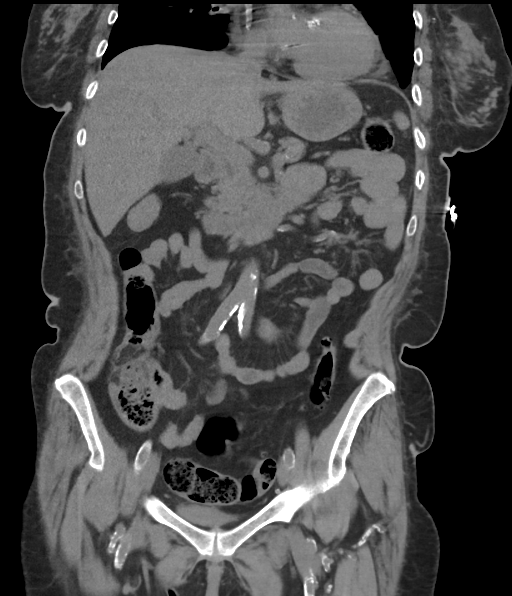
[im 51/91  soft-tissue]
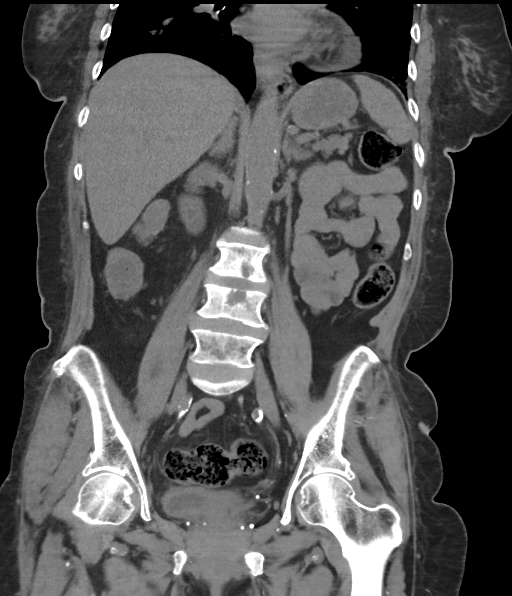

[14 of 46 positions shown; findings below may reference images not displayed]

FINDINGS: Lower chest: Dependent ground-glass opacity in the lung bases likely
reflective of atelectasis. Borderline cardiomegaly with trace
pericardial effusion. Extensive atherosclerotic calcification of the
coronary arteries. Calcification of the aortic leaflets. A dialysis
catheter tip is partially visualized at the level of the right
atrium.

Hepatobiliary: No focal liver abnormality is seen. No gallstones,
gallbladder wall thickening, or biliary dilatation.

Pancreas: Unremarkable. No pancreatic ductal dilatation or
surrounding inflammatory changes.

Spleen: Normal in size without focal abnormality.

Adrenals/Urinary Tract: Normal appearance of the adrenal glands.
Both kidneys appear atrophic. A 14 13 mm fluid attenuation cysts is
seen in the interpolar region right kidney. A smaller 5 mm
hyperattenuating exophytic, potentially cystic lesion arising from
the left upper pole. Similar 3 mm hyperattenuating lesion is seen in
the posterior right upper pole. The smaller lesion is indeterminate
on this noncontrast enhanced study. No visible urolithiasis or
hydronephrosis. Mild left urothelial thickening at the renal pelvis
is noted. Bladder is decompressed with circumferential wall
thickening though this is somewhat greater than expected with mild
perivesicular haze.

Stomach/Bowel: Moderate hiatal hernia. Distal stomach and duodenum
are unremarkable. No small bowel dilatation or wall thickening.
There is borderline dilation up to 8 mm of a fluid-filled appendix
seen in the right lower quadrant without significant periappendiceal
fat stranding or pericecal inflammation. Scattered colonic
diverticula without focal pericolonic inflammation to suggest
diverticulitis.

Vascular/Lymphatic: Extensive atherosclerotic calcification is seen
of the aorta and branch vessels. Evaluation for luminal stenosis is
limited in absence of contrast media. No suspicious or enlarged
lymph nodes in the included lymphatic chains.

Reproductive: Uterus is surgically absent. No concerning adnexal
lesions.

Other: No abdominopelvic free fluid or free gas. No bowel containing
hernias. Postsurgical changes of the anterior abdomen.

Musculoskeletal: There is diffusely includes sclerosis of the L3 and
L4 vertebral bodies with multilobulated lytic appearing lesions
involving centered upon the disc space which also demonstrates
central hypoattenuation. Please soft tissue thickening anteriorly at
this level as well. Concerning lead there is an additional lucent
lesion within the left sacral ala though this could reflect a
separate process as the appearance is discs similar and is likely
more compatible with irregular marrow heterogeneity in the setting
of dialysis dependence.
IMPRESSION: 1. Sclerosis of L3 and 4 with irregular lucencies in the end plates
and hypoattenuation of the intervertebral disc space with adjacent
hazy stranding. Findings are concerning for discitis/osteomyelitis.
Recommend evaluation with a noncontrast MRI of the lumbar spine for
further evaluation given patient's renal compromise.
2. Presence of an additional lucent lesion in the left sacral ala
however the appearance is dissimilar to the aforementioned lucency
within L3 and L4. Finding could reflect irregular marrow
heterogeneity in the setting of dialysis dependence the malignancy
is excluded. Will be better evaluated on MR as well.
3. Mild left urothelial thickening at the left renal pelvis and
questionable thickening of the urinary bladder, nonspecific, but can
be seen with ascending infection. Correlate with urinalysis.
4. Borderline dilation of a fluid-filled appendix up to 8 mm without
significant periappendiceal fat stranding or pericecal inflammation.
Most likely normal for this patient unless additional clinical
findings are present to suggest appendicitis.
5. Moderate hiatal hernia.
6. Two subcentimeter, hyperattenuating, potentially cystic lesion
arises from the upper pole of both kidneys. Consider further
evaluation with nonemergent renal ultrasound for further evaluation.
7. Aortic Atherosclerosis ([id number]).

These results and recommendations were called by telephone at the
time of interpretation on 07/19/2019 at [DATE] to Dr. ANNETTA JELKS ,
who verbally acknowledged these results.

## 2020-08-01 IMAGING — CT CT HEAD WITHOUT CONTRAST
4 series · 17 of 47 positions shown, 19 images · non-contrast
Comparison: Brain MRI 03/03/2018.  Head CT 08/22/2010.

CLINICAL DATA: 67-year-old female with altered mental status after
dialysis this morning.

EXAM:
CT HEAD WITHOUT CONTRAST
TECHNIQUE: Contiguous axial images were obtained from the base of the skull
through the vertex without intravenous contrast.

[Series 2: head w o · axial · 0.41mm/px · z∈[-51,+64]mm · 7 of 31 slices shown, 9 images]
[im 4/31  brain]
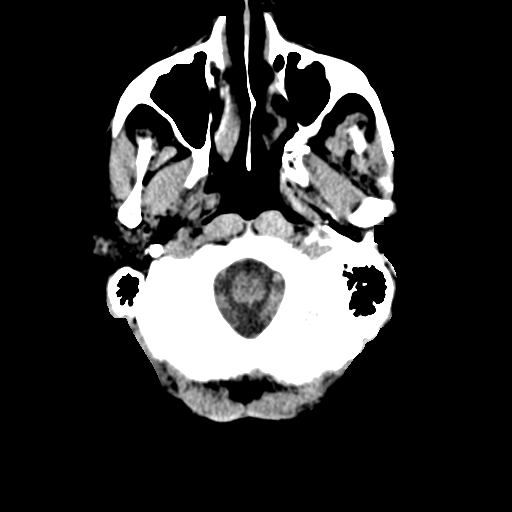
[im 4/31  bone]
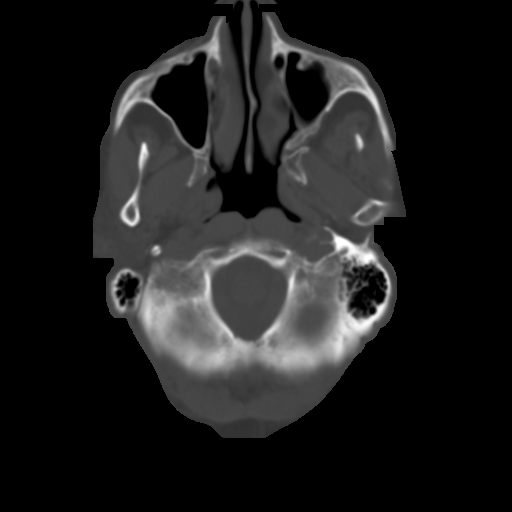
[im 8/31  brain]
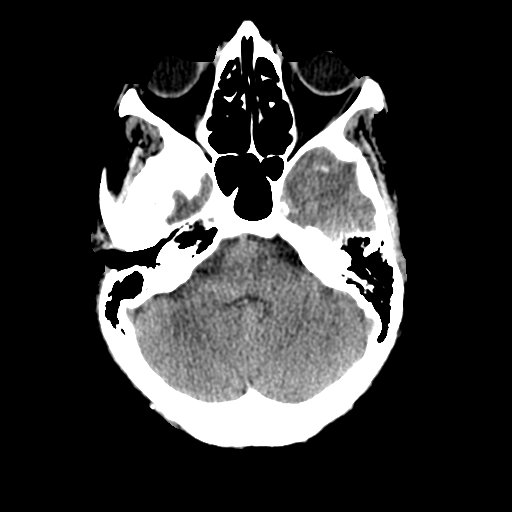
[im 12/31  brain]
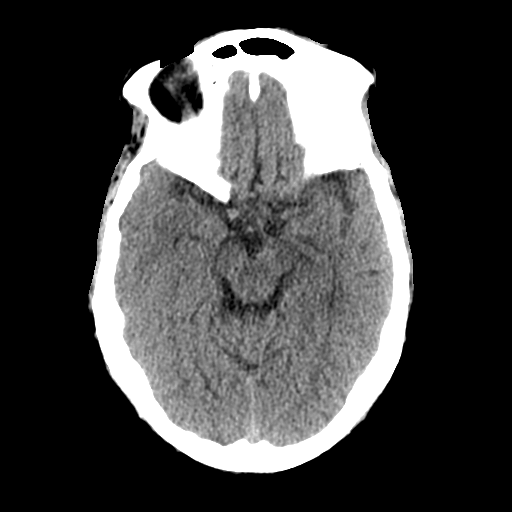
[im 16/31  brain]
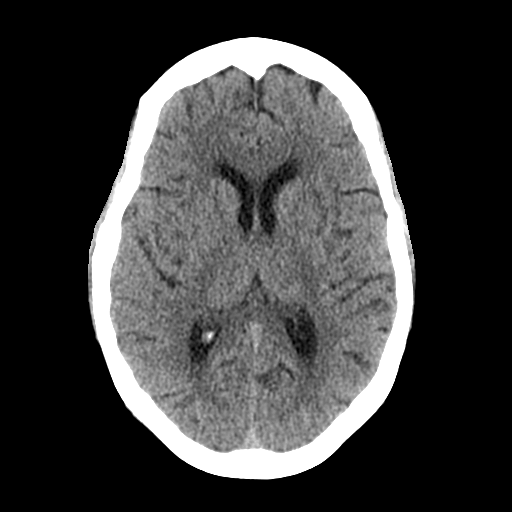
[im 19/31  brain]
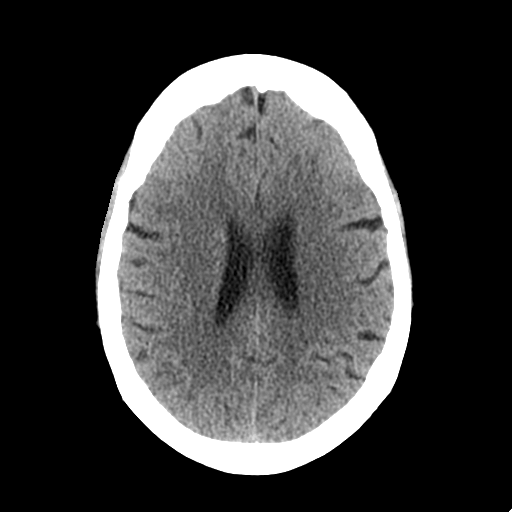
[im 19/31  bone]
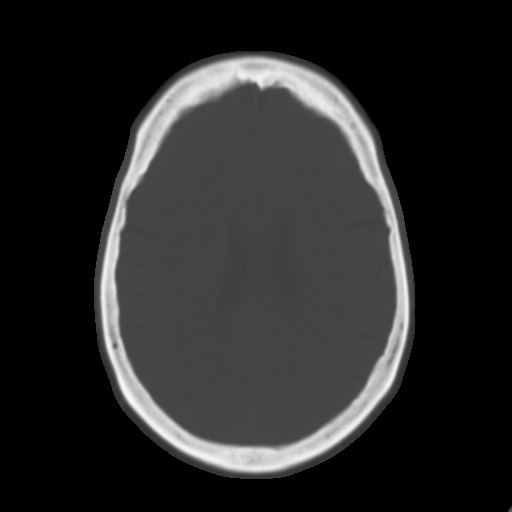
[im 23/31  brain]
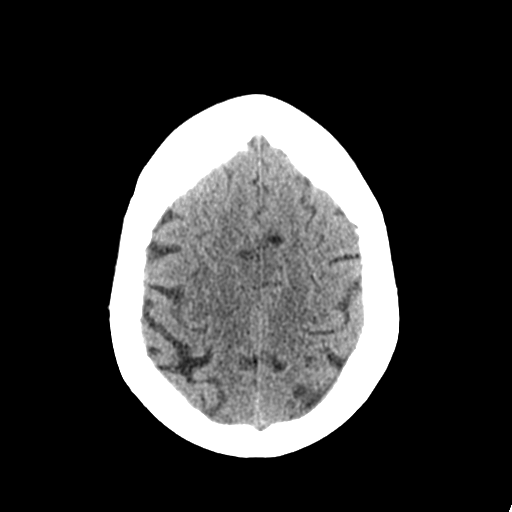
[im 27/31  brain]
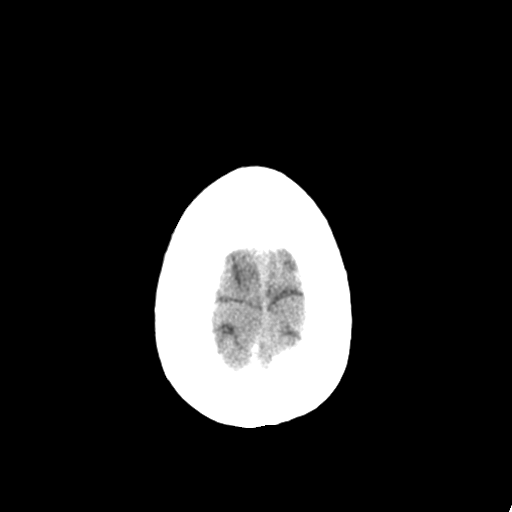

[Series 3: head bone · axial · 0.41mm/px · z∈[-52,+2]mm · 4 of 78 slices shown]
[im 8/78  bone]
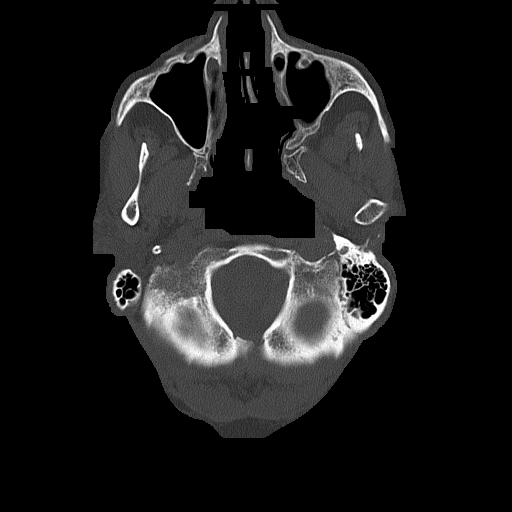
[im 16/78  bone]
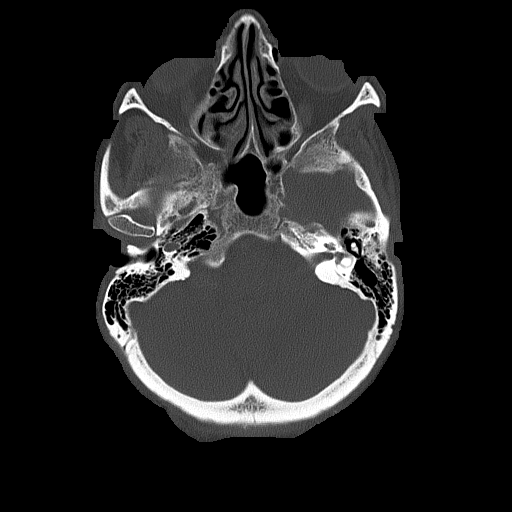
[im 24/78  bone]
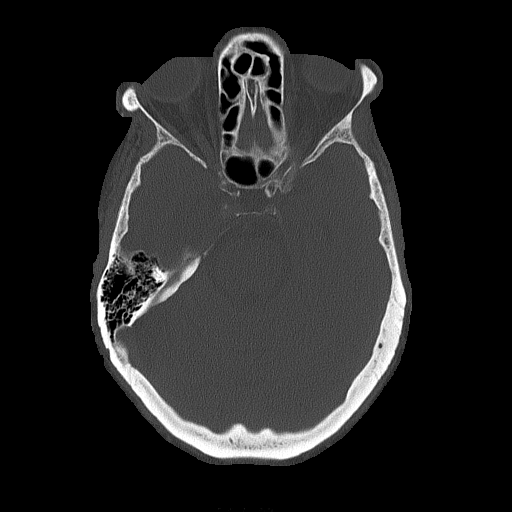
[im 35/78  bone]
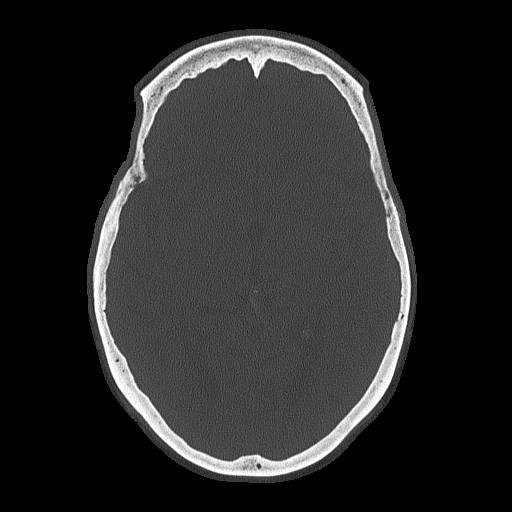

[Series 4: coronal soft · coronal · 0.31mm/px · 3 of 71 slices shown]
[im 24/71  brain]
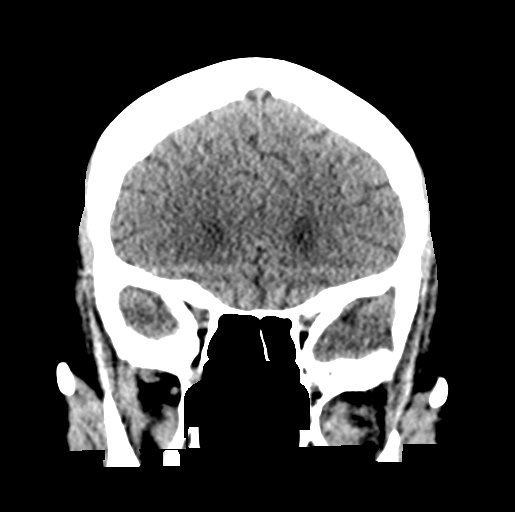
[im 32/71  brain]
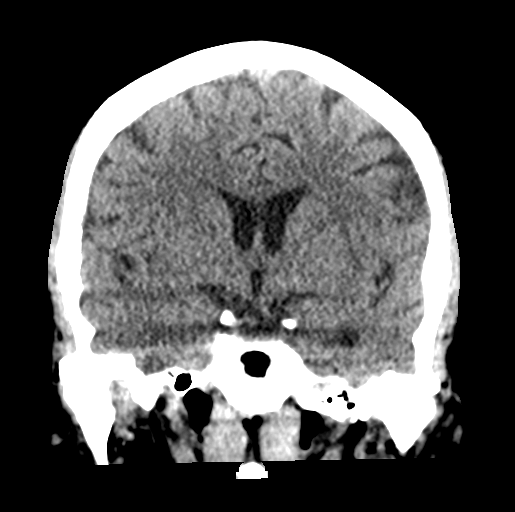
[im 39/71  brain]
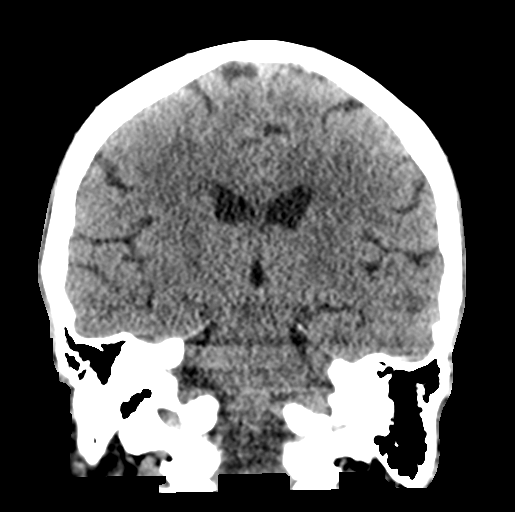

[Series 5: sagittal soft · sagittal · 0.36mm/px · 3 of 55 slices shown]
[im 19/55  brain]
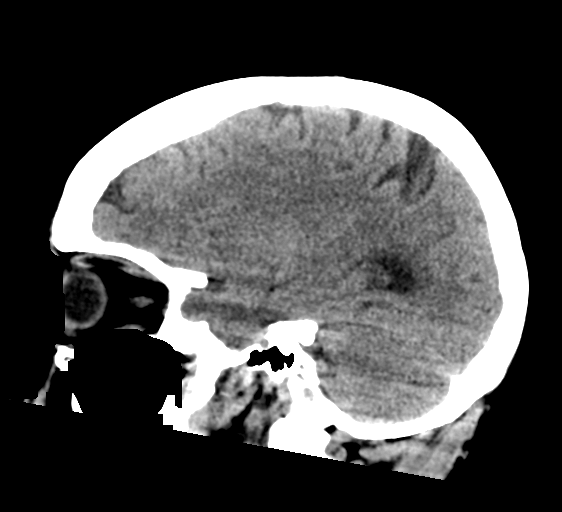
[im 28/55  brain]
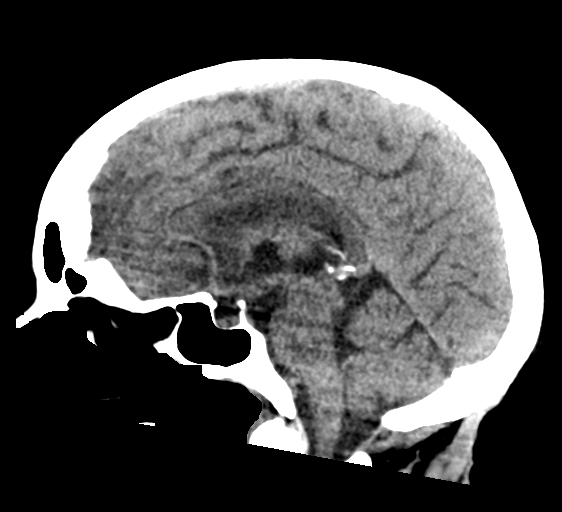
[im 37/55  brain]
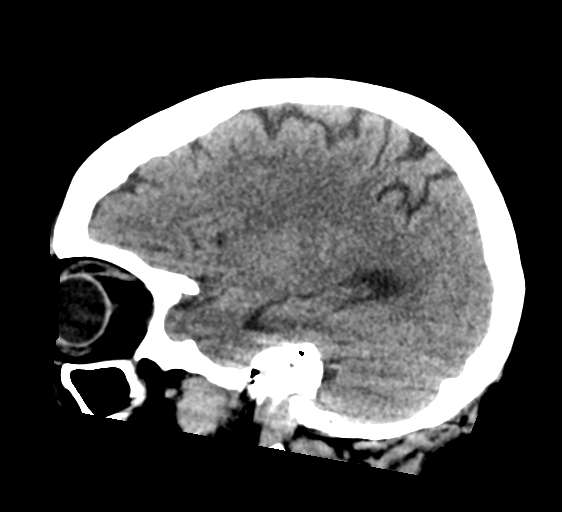

[17 of 47 positions shown; findings below may reference images not displayed]

FINDINGS: Brain: Generalized cerebral volume loss since the 7166 CT. No
ventriculomegaly. No midline shift, mass effect, or evidence of
intracranial mass lesion. No acute intracranial hemorrhage
identified.

Stable small chronic infarcts in the left cerebellum and left
thalamus. No definite cortical encephalomalacia. No cortically based
acute infarct identified.

Vascular: Extensive Calcified atherosclerosis at the skull base. No
suspicious intracranial vascular hyperdensity.

Skull: No acute osseous abnormality identified.

Sinuses/Orbits: Visualized paranasal sinuses and mastoids are stable
and well pneumatized.

Other: Stable orbit and scalp soft tissues.
IMPRESSION: No acute intracranial abnormality identified. Chronic small vessel
disease appears stable since the 6293 MRI.

## 2020-08-02 DIAGNOSIS — Z992 Dependence on renal dialysis: Secondary | ICD-10-CM | POA: Diagnosis not present

## 2020-08-02 DIAGNOSIS — N186 End stage renal disease: Secondary | ICD-10-CM | POA: Diagnosis not present

## 2020-08-02 IMAGING — DX CHEST - 2 VIEW
2 series · 2 of 2 positions shown · non-contrast
Comparison: Chest radiograph 07/19/2019, 08/18/2018

CLINICAL DATA: Abnormal labs, suspected sepsis. Sob.

EXAM:
CHEST - 2 VIEW

[chest pa]
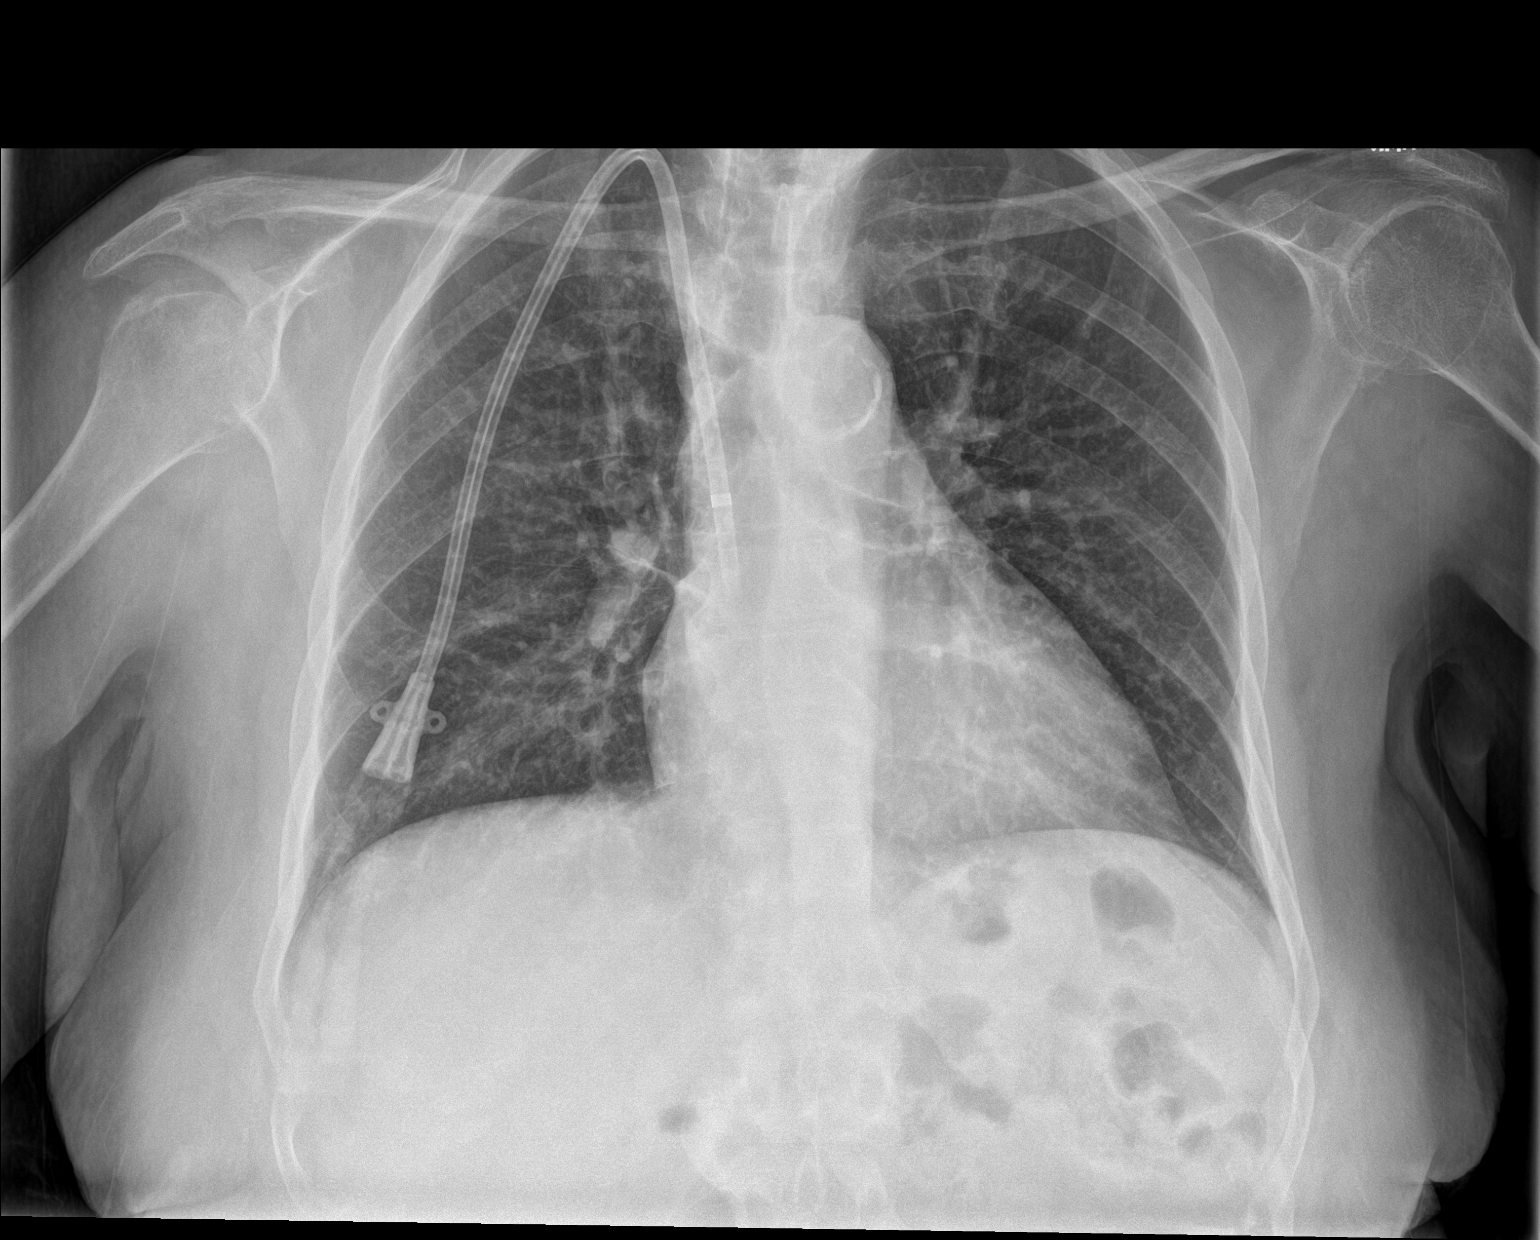

[chest lat]
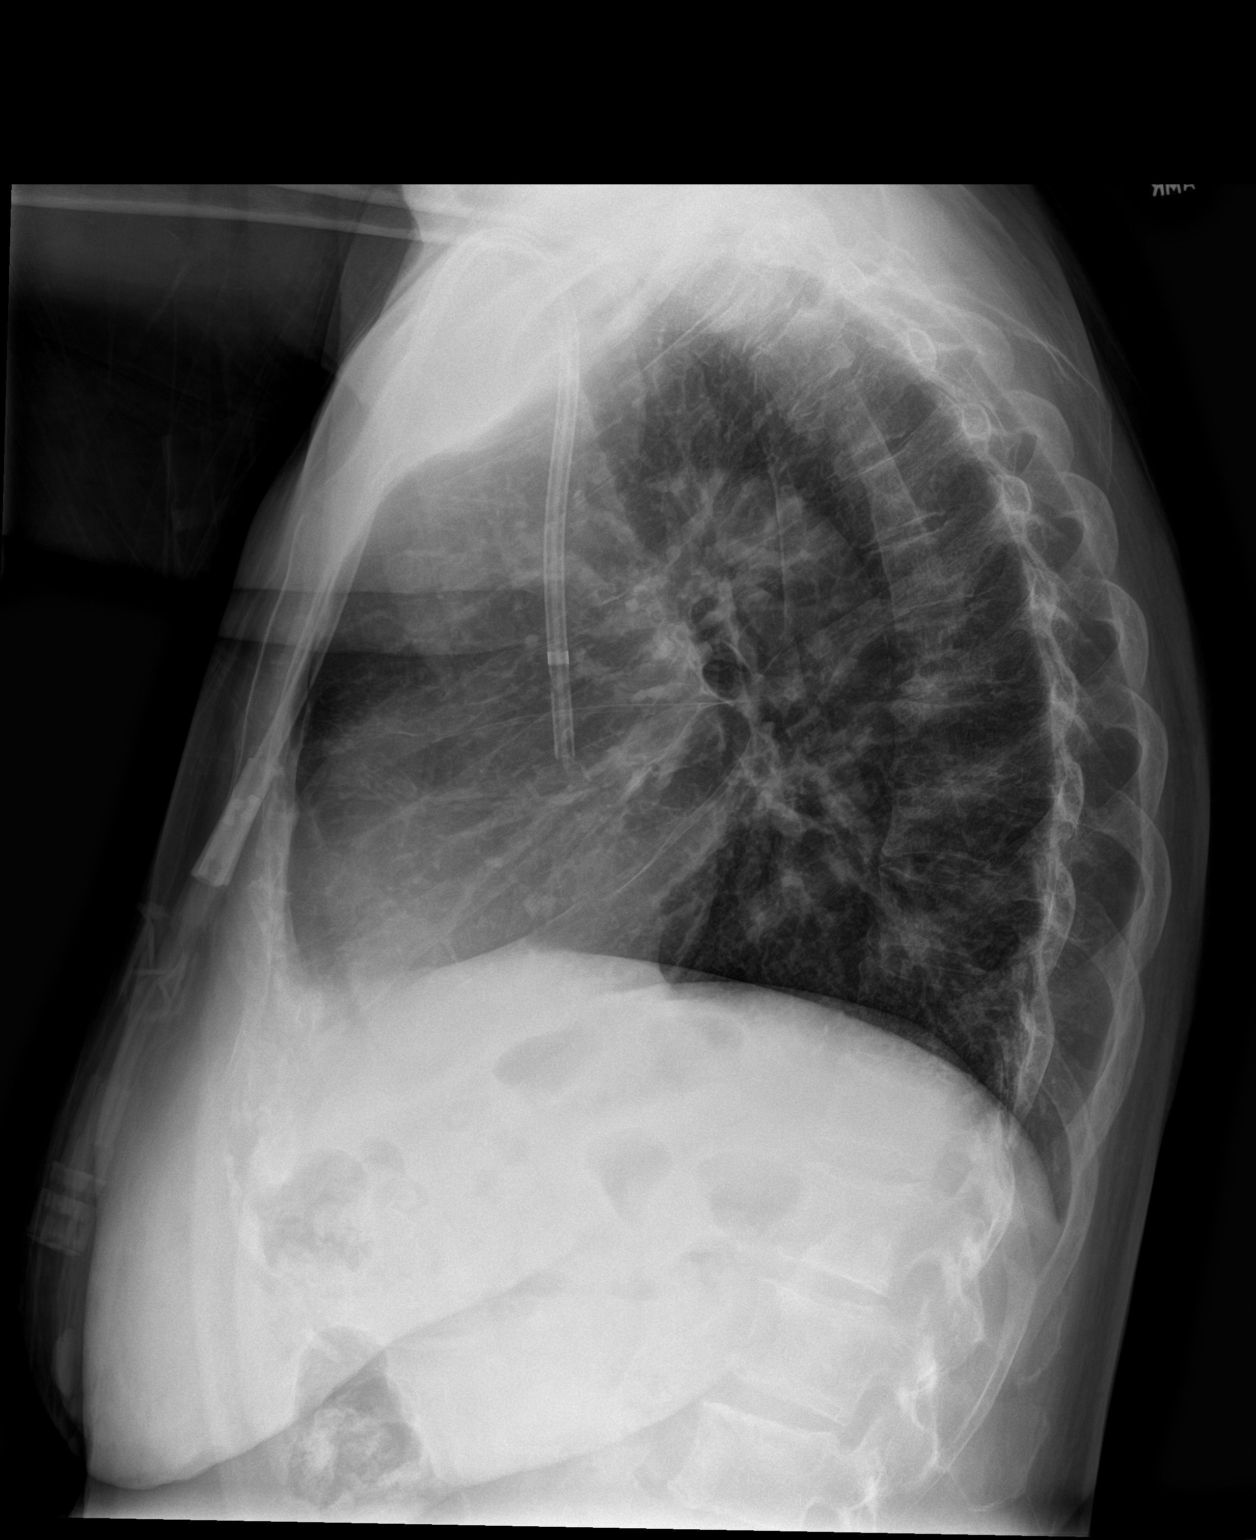

[2 of 2 positions shown; findings below may reference images not displayed]

FINDINGS: Stable cardiomediastinal contours. Atherosclerotic calcification in
the aortic arch. Stable position of a right central venous catheter.
Possible developing infiltrate in the right lower lobe. No
pneumothorax or pleural effusion. No acute abnormality in the
visualized skeleton or upper abdomen.
IMPRESSION: Possible developing infiltrate in the right lower lobe.

## 2020-08-05 DIAGNOSIS — G8194 Hemiplegia, unspecified affecting left nondominant side: Secondary | ICD-10-CM | POA: Diagnosis not present

## 2020-08-05 DIAGNOSIS — I639 Cerebral infarction, unspecified: Secondary | ICD-10-CM | POA: Diagnosis not present

## 2020-08-05 DIAGNOSIS — N186 End stage renal disease: Secondary | ICD-10-CM | POA: Diagnosis not present

## 2020-08-05 DIAGNOSIS — I330 Acute and subacute infective endocarditis: Secondary | ICD-10-CM | POA: Diagnosis not present

## 2020-08-05 DIAGNOSIS — Z992 Dependence on renal dialysis: Secondary | ICD-10-CM | POA: Diagnosis not present

## 2020-08-06 ENCOUNTER — Ambulatory Visit: Payer: Medicare PPO | Admitting: Cardiology

## 2020-08-06 ENCOUNTER — Other Ambulatory Visit: Payer: Self-pay

## 2020-08-06 ENCOUNTER — Encounter: Payer: Self-pay | Admitting: Cardiology

## 2020-08-06 VITALS — BP 110/60 | HR 72 | Temp 95.9°F | Ht 67.0 in | Wt 153.8 lb

## 2020-08-06 DIAGNOSIS — I340 Nonrheumatic mitral (valve) insufficiency: Secondary | ICD-10-CM | POA: Diagnosis not present

## 2020-08-06 DIAGNOSIS — Z8673 Personal history of transient ischemic attack (TIA), and cerebral infarction without residual deficits: Secondary | ICD-10-CM | POA: Diagnosis not present

## 2020-08-06 DIAGNOSIS — R0989 Other specified symptoms and signs involving the circulatory and respiratory systems: Secondary | ICD-10-CM

## 2020-08-06 DIAGNOSIS — Z992 Dependence on renal dialysis: Secondary | ICD-10-CM | POA: Diagnosis not present

## 2020-08-06 DIAGNOSIS — Z8679 Personal history of other diseases of the circulatory system: Secondary | ICD-10-CM

## 2020-08-06 DIAGNOSIS — N186 End stage renal disease: Secondary | ICD-10-CM

## 2020-08-06 DIAGNOSIS — E119 Type 2 diabetes mellitus without complications: Secondary | ICD-10-CM | POA: Diagnosis not present

## 2020-08-06 NOTE — Progress Notes (Signed)
Cardiology Office Note:    Date:  08/06/2020   ID:  [name], DOB August 10, 1951, MRN [id number]  PCP:  Diana Hilding, MD  Cardiologist:  Diana Dresser, MD  Referring MD: Diana Hilding, MD   CC: follow up  History of Present Illness:    [name] is a 69 y.o. 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 I initially met her 02/01/20 as a new consult at the request of [name], Diana Moment, MD for the evaluation and management of mitral valve regurgitation.  Note from Dr. Prescott Romero from 01/24/20 reviewed. Also reviewed prolonged hospitalization records from 10/2019-12/05/19 for recurrent pseudomonas bacteremia (initiallly 06/2019), mitral valve endocarditis, embolic stroke. She received 6 week course of IV antibiotics. Also noted to have left IJ DVT, recommended for three months of anticoagulation with apixaban (initially on coumadin due to ESRD, but labile INRs were a challenge, transitioned to DOAC). She is chronically nonambulatory and uses a wheelchair. Felt to be a very poor surgical candidate, recommended for medical management of her MR.  Today: Doing well overall. Reviewed recent echo, reassuring. Recently seen by Dr. Prescott Romero.  Occasional chest pain, feels like a tightness. None very recently. Sometimes with dialysis, sometimes not. Blood pressure drops with dialysis, sometimes uses oxygen during sessions as well for shortness of breath.   No PND, orthopnea, LE edema or unexpected weight gain. No syncope or palpitations.  Stays cold all the time, but no fevers.   Just had cataracts surgery, being monitored for bleeding in the eye. Apixaban on hold given recent procedure. She is on apixaban for chronic DVT of the R IJ.   Past Medical History:  Diagnosis Date  . Arthritis    hands  . Constipation   . Diabetes mellitus without complication (HCC)     Type II  . ESRD (end stage renal disease) ([place])     M/W/F- Hemodialysis  . GERD (gastroesophageal reflux disease)   . Headache   . Hyperlipidemia   . Hypertension   . Irregular heart rate   . Stroke [facility])    TIA - approx 2010- no residual    Past Surgical History:  Procedure Laterality Date  . ABDOMINAL HYSTERECTOMY    . AORTIC ARCH ANGIOGRAPHY N/A 03/28/2019   Procedure: AORTIC ARCH ANGIOGRAPHY;  Surgeon: Diana Sandy, MD;  Location: [place] [facility];  Service: Cardiovascular;  Laterality: N/A;  . AV FISTULA PLACEMENT Left 06/13/2018   Procedure: ARTERIOVENOUS (AV) FISTULA CREATION;  Surgeon: Diana Posner, MD;  Location: [place];  Service: Vascular;  Laterality: Left;  . AV FISTULA PLACEMENT Left 08/18/2018   Procedure: INSERTION OF 4-7MM X 45CM ARTERIOVENOUS (AV) GORE-TEX GRAFT LEFT  FOREARM;  Surgeon: Diana Posner, MD;  Location: [place];  Service: Vascular;  Laterality: Left;  . AV FISTULA PLACEMENT Right 04/06/2019   Procedure: INSERTION OF ARTERIOVENOUS (AV) GORE-TEX GRAFT RIGHT UPPER ARM;  Surgeon: Diana Sandy, MD;  Location: [place];  Service: Vascular;  Laterality: Right;  . AV FISTULA PLACEMENT Right 04/13/2019   Procedure: INSERTION OF ARTERIOVENOUS (AV) BOVINE  ARTEGRAFT GRAFT RIGHT UPPER EXTREMITY;  Surgeon: Diana Mitchell, MD;  Location: [place];  Service: Vascular;  Laterality: Right;  . [place] REMOVAL Right 05/16/2019   Procedure: REMOVAL OF ARTERIOVENOUS GORETEX GRAFT ([place]) RIGHT ARM;  Surgeon: Diana Mould, MD;  Location: [place];  Service:  Vascular;  Laterality: Right;  . BASCILIC VEIN TRANSPOSITION Right 03/07/2019   Procedure: First Stage Bascilic Vein Transposition Right Arm;  Surgeon: Diana Mitchell, MD;  Location: [place];  Service: Vascular;  Laterality: Right;  . CATARACT EXTRACTION Right 2005  . INSERTION OF DIALYSIS CATHETER N/A 08/18/2018   Procedure: INSERTION OF DIALYSIS CATHETER;  Surgeon: Diana Posner, MD;  Location: [place];   Service: Vascular;  Laterality: N/A;  . INSERTION OF DIALYSIS CATHETER Right 07/25/2019   Procedure: INSERTION OF DIALYSIS CATHETER RIGHT INTERNAL JUGULAR (TUNNELED);  Surgeon: Diana Cagey, MD;  Location: [facility];  Service: General;  Laterality: Right;  . IR FLUORO GUIDE CV LINE LEFT  10/26/2019  . IR FLUORO GUIDE CV LINE RIGHT  12/06/2018  . IR PTA ADDL CENTRAL DIALYSIS SEG THRU DIALY CIRCUIT RIGHT Right 12/06/2018  . IR REMOVAL TUN CV CATH W/O FL  10/23/2019  . IR US GUIDE VASC ACCESS LEFT  10/26/2019  . TEE WITHOUT CARDIOVERSION N/A 10/26/2019   Procedure: TRANSESOPHAGEAL ECHOCARDIOGRAM (TEE);  Surgeon: Diana Perla, MD;  Location: [place];  Service: Cardiovascular;  Laterality: N/A;  . THROMBECTOMY AND REVISION OF ARTERIOVENTOUS (AV) GORETEX  GRAFT Left 09/29/2018   Procedure: INSERTION OF ARTERIOVENTOUS (AV) GORETEX  GRAFT ARM;  Surgeon: Diana Sandy, MD;  Location: [place];  Service: Vascular;  Laterality: Left;  . UPPER EXTREMITY ANGIOGRAPHY Right 03/28/2019   Procedure: UPPER EXTREMITY ANGIOGRAPHY;  Surgeon: Diana Sandy, MD;  Location: [place] [facility];  Service: Cardiovascular;  Laterality: Right;  . VENOGRAM  10/27/2018   Procedure: Venogram;  Surgeon: Diana Heck, MD;  Location: [place] [facility];  Service: Cardiovascular;;  bilateral arm    Current Medications: Current Outpatient Medications on File Prior to Visit  Medication Sig  . acetaminophen (TYLENOL) 325 MG tablet Take 2 tablets (650 mg total) by mouth every 12 (twelve) hours as needed for mild pain (or temp > 37.5 C (99.5 F)).  Marland Kitchen apixaban (ELIQUIS) 5 MG TABS tablet Take 1 tablet (5 mg total) by mouth 2 (two) times daily.  . carvedilol (COREG) 12.5 MG tablet Take 1 tablet (12.5 mg total) by mouth 2 (two) times daily.  . Darbepoetin Alfa (ARANESP) 100 MCG/0.5ML SOSY injection Inject 0.5 mLs (100 mcg total) into the vein every Monday with hemodialysis.  Marland Kitchen diclofenac Sodium (VOLTAREN)  1 % GEL Apply 2 g topically 4 (four) times daily.  . divalproex (DEPAKOTE) 250 MG DR tablet Take 1 tablet (250 mg total) by mouth 2 (two) times daily.  Marland Kitchen doxercalciferol (HECTOROL) 4 MCG/2ML injection Inject 0.75 mLs (1.5 mcg total) into the vein every Monday, Wednesday, and Friday with hemodialysis.  . ferric gluconate 125 mg in sodium chloride 0.9 % 100 mL Inject 125 mg into the vein every Monday.  . gabapentin (NEURONTIN) 300 MG capsule Take 1 capsule (300 mg total) by mouth daily as needed (nerve pain).  . isosorbide mononitrate (IMDUR) 60 MG 24 hr tablet Take 1 tablet (60 mg total) by mouth daily.  . multivitamin (RENA-VIT) TABS tablet Take 1 tablet by mouth daily after lunch.  . pantoprazole (PROTONIX) 40 MG tablet Take 1 tablet (40 mg total) by mouth daily.   No current facility-administered medications on file prior to visit.     Allergies:   Patient has no known allergies.   Social History   Tobacco Use  . Smoking status: Never Smoker  . Smokeless tobacco: Never Used  Vaping Use  . Vaping Use:  Never used  Substance Use Topics  . Alcohol use: Never  . Drug use: Never    Family History: family history includes Alcoholism in her father; Cirrhosis in her father; Diabetes in her mother; Heart failure in her mother; Heart murmur in her mother.  ROS:   Please see the history of present illness.  Additional pertinent ROS otherwise unremarkable.  EKGs/Labs/Other Studies Reviewed:    The following studies were reviewed today: TTE 2020/05/21 1. Left ventricular ejection fraction, by estimation, is 55 to 60%. The  left ventricle has normal function. The left ventricle has no regional  wall motion abnormalities. There is moderate left ventricular hypertrophy.  Left ventricular diastolic  parameters are consistent with Grade I diastolic dysfunction (impaired  relaxation).  2. Right ventricular systolic function is normal. The right ventricular  size is normal. There is normal  pulmonary artery systolic pressure.  3. Left atrial size was mildly dilated.  4. Exuberant MAC and MV calcification and thickening Mobile calcification  at the base of the anterior leaflet likely from MAC but cannot r/o  vegetation. The mitral valve is degenerative. Mild mitral valve  regurgitation. No evidence of mitral stenosis.  5. The aortic valve is tricuspid. Aortic valve regurgitation is not  visualized. severe sclerosis without stenosis.  6. The inferior vena cava is normal in size with greater than 50%  respiratory variability, suggesting right atrial pressure of 3 mmHg.   TTE 11/20/19 1. Left ventricular ejection fraction, by visual estimation, is 55 to  60%. The left ventricle has normal function. Left ventricular septal wall  thickness was severely increased. Moderately increased left ventricular  posterior wall thickness.  2. Elevated left atrial and left ventricular end-diastolic pressures.  3. Left ventricular diastolic parameters are consistent with Grade I  diastolic dysfunction (impaired relaxation).  4. Moderate thickening of the anterior mitral valve leaflet(s).  5. Moderate vegetation on the mitral valve.  6. The mitral valve is abnormal. Moderate to severe mitral valve  regurgitation.  7. Tricuspid valve regurgitation moderate.  8. Tricuspid valve regurgitation moderate.  9. Moderate thickening.  10. Normal pulmonary artery systolic pressure.  11. A prior study was performed on 10/26/2019.  12. Changes from prior study are noted.  13. Compared to the prior TEE, moderate AMVL endocarditis persists with a  mobile mass noted on the anterior leaflet ~0.5 cm in diameter, may be  vegetation or ruptured apparatus. There is moderate to severe, posteriorly  directed MR. Clinical correlation  with CHF symptoms is recommended.   TEE 10/26/19 1. Left ventricular ejection fraction, by visual estimation, is 55 to  60%. The left ventricle has normal function.  There is mildly increased  left ventricular hypertrophy.  2. Global right ventricle has normal systolic function.The right  ventricular size is normal.  3. Left atrial size was normal.  4. Right atrial size was normal.  5. Trivial pericardial effusion is present.  6. Large vegetation on the mitral valve.  7. The mitral valve is abnormal. Moderate mitral valve regurgitation.  8. The tricuspid valve is grossly normal. Tricuspid valve regurgitation  is mild.  9. The aortic valve is tricuspid. Aortic valve regurgitation is trivial.  Mild aortic valve sclerosis without stenosis.  10. The pulmonic valve was grossly normal. Pulmonic valve regurgitation is  mild.  11. Moderate plaque invoving the descending aorta.  12. Normal LV systolic function; large vegetation on anterior MV leaflet  with moderate, eccentric, posterolaterally directed MR; mild TR.   EKG:  EKG is personally  reviewed.  The ekg ordered 06/03/20 demonstrates sinus rhythm at 81 bpm.  Recent Labs: 04/23/2020: Magnesium 1.9; TSH 0.736 04/25/2020: B Natriuretic Peptide 136.3 04/26/2020: ALT 16 05/31/2020: BUN 26; Creatinine, Ser 4.63; Hemoglobin 12.0; Platelets 162; Potassium 5.9; Sodium 140  Recent Lipid Panel    Component Value Date/Time   CHOL 120 11/15/2019 0718   TRIG 177 (H) 11/15/2019 0718   HDL 28 (L) 11/15/2019 0718   CHOLHDL 4.3 11/15/2019 0718   VLDL 35 11/15/2019 0718   LDLCALC 57 11/15/2019 0718    Physical Exam:    VS:  BP 110/60   Pulse 72   Temp (!) 95.9 F (35.5 C)   Ht _0  (1.702 m)   Wt 153 lb 12.8 oz (69.8 kg)   SpO2 96%   BMI 24.09 kg/m     Wt Readings from Last 3 Encounters:  08/06/20 153 lb 12.8 oz (69.8 kg)  07/17/20 147 lb 6.4 oz (66.9 kg)  05/31/20 139 lb (63 kg)    GEN: frail, thin woman in no acute distress HEENT: Normal, moist mucous membranes NECK: No JVD CARDIAC: regular rhythm, normal S1 and S2, no rubs or gallops. 2/6 HSM VASCULAR: Radial and DP pulses 2+ bilaterally. No  carotid bruits RESPIRATORY:  Clear to auscultation without rales, wheezing or rhonchi  ABDOMEN: Soft, non-tender, non-distended MUSCULOSKELETAL:  Ambulates independently SKIN: Warm and dry, no significant edema NEUROLOGIC:  Alert and oriented x 3. No focal neuro deficits noted. PSYCHIATRIC:  Normal affect   ASSESSMENT:    1. Nonrheumatic mitral valve regurgitation   2. History of bacterial endocarditis   3. History of embolic stroke   4. ESRD on dialysis ([place])   5. Labile blood pressure   6. Type 2 diabetes mellitus without obesity (HCC)    PLAN:    mitral regurgitation, secondary to endocarditis of the anterior mitral valve: -has completed antibiotic treatment -not a surgical candidate given frailty/comorbidities -no active heart failure symptoms -volume management per HD -recent echo rechecked, with improvement in MR. Would continue with afterload reduction and volume management  Multiple embolic strokes, presumed from mitral valve endocarditis: -follows with neurology  ESRD, on dialysis: -has intermittent hypotension with dialysis. Would continue afterload reduction if blood pressure allows, but may may ned to scale back on carvedilol/imdur in the future if hypotension worsens  Hypertension: labile blood pressure -as above, swings between hypertension and hypotension with dialysis -on carvedilol 12.5 mg BID -on imdur 60 mg daily -would consider holding these medications the morning of dialysis, but will defer to her nephrology team  Type II diabetes: last A1c 6.9 -on no current diabetes meds  Right IJ DVT: -on apixaban given labile INRs. On hold with recent surgery. Defer to surgeon on restart of apixaban  Plan for follow up: 1 year or sooner as needed  Diana Dresser, MD, PhD [place]  [facility]    Medication Adjustments/Labs and Tests Ordered: Current medicines are reviewed at length with the patient today.  Concerns regarding medicines are  outlined above.  No orders of the defined types were placed in this encounter.  No orders of the defined types were placed in this encounter.   Patient Instructions  Medication Instructions:  Your Physician recommend you continue on your current medication as directed.    *If you need a refill on your cardiac medications before your next appointment, please call your pharmacy*   Lab Work: None ordered   Testing/Procedures: None ordered    Follow-Up: At [facility],  you and your health needs are our priority.  As part of our continuing mission to provide you with exceptional heart care, we have created designated Provider Care Teams.  These Care Teams include your primary Cardiologist (physician) and Advanced Practice Providers (APPs -  Physician Assistants and Nurse Practitioners) who all work together to provide you with the care you need, when you need it.  We recommend signing up for the patient portal called "MyChart".  Sign up information is provided on this After Visit Summary.  MyChart is used to connect with patients for Virtual Visits (Telemedicine).  Patients are able to view lab/test results, encounter notes, upcoming appointments, etc.  Non-urgent messages can be sent to your provider as well.   To learn more about what you can do with MyChart, go to [email or web address].    Your next appointment:   1 year(s)  The format for your next appointment:   In Person  Provider:   Buford Dresser, MD       Signed, Diana Dresser, MD PhD 08/06/2020     [facility]

## 2020-08-06 NOTE — Patient Instructions (Signed)

## 2020-08-07 DIAGNOSIS — N186 End stage renal disease: Secondary | ICD-10-CM | POA: Diagnosis not present

## 2020-08-07 DIAGNOSIS — Z992 Dependence on renal dialysis: Secondary | ICD-10-CM | POA: Diagnosis not present

## 2020-08-07 IMAGING — CR DG CHEST 1V PORT
1 series · 1 of 1 positions shown · non-contrast
Comparison: 07/20/2019

CLINICAL DATA: Hemodialysis catheter insertion

EXAM:
PORTABLE CHEST 1 VIEW

[portable]
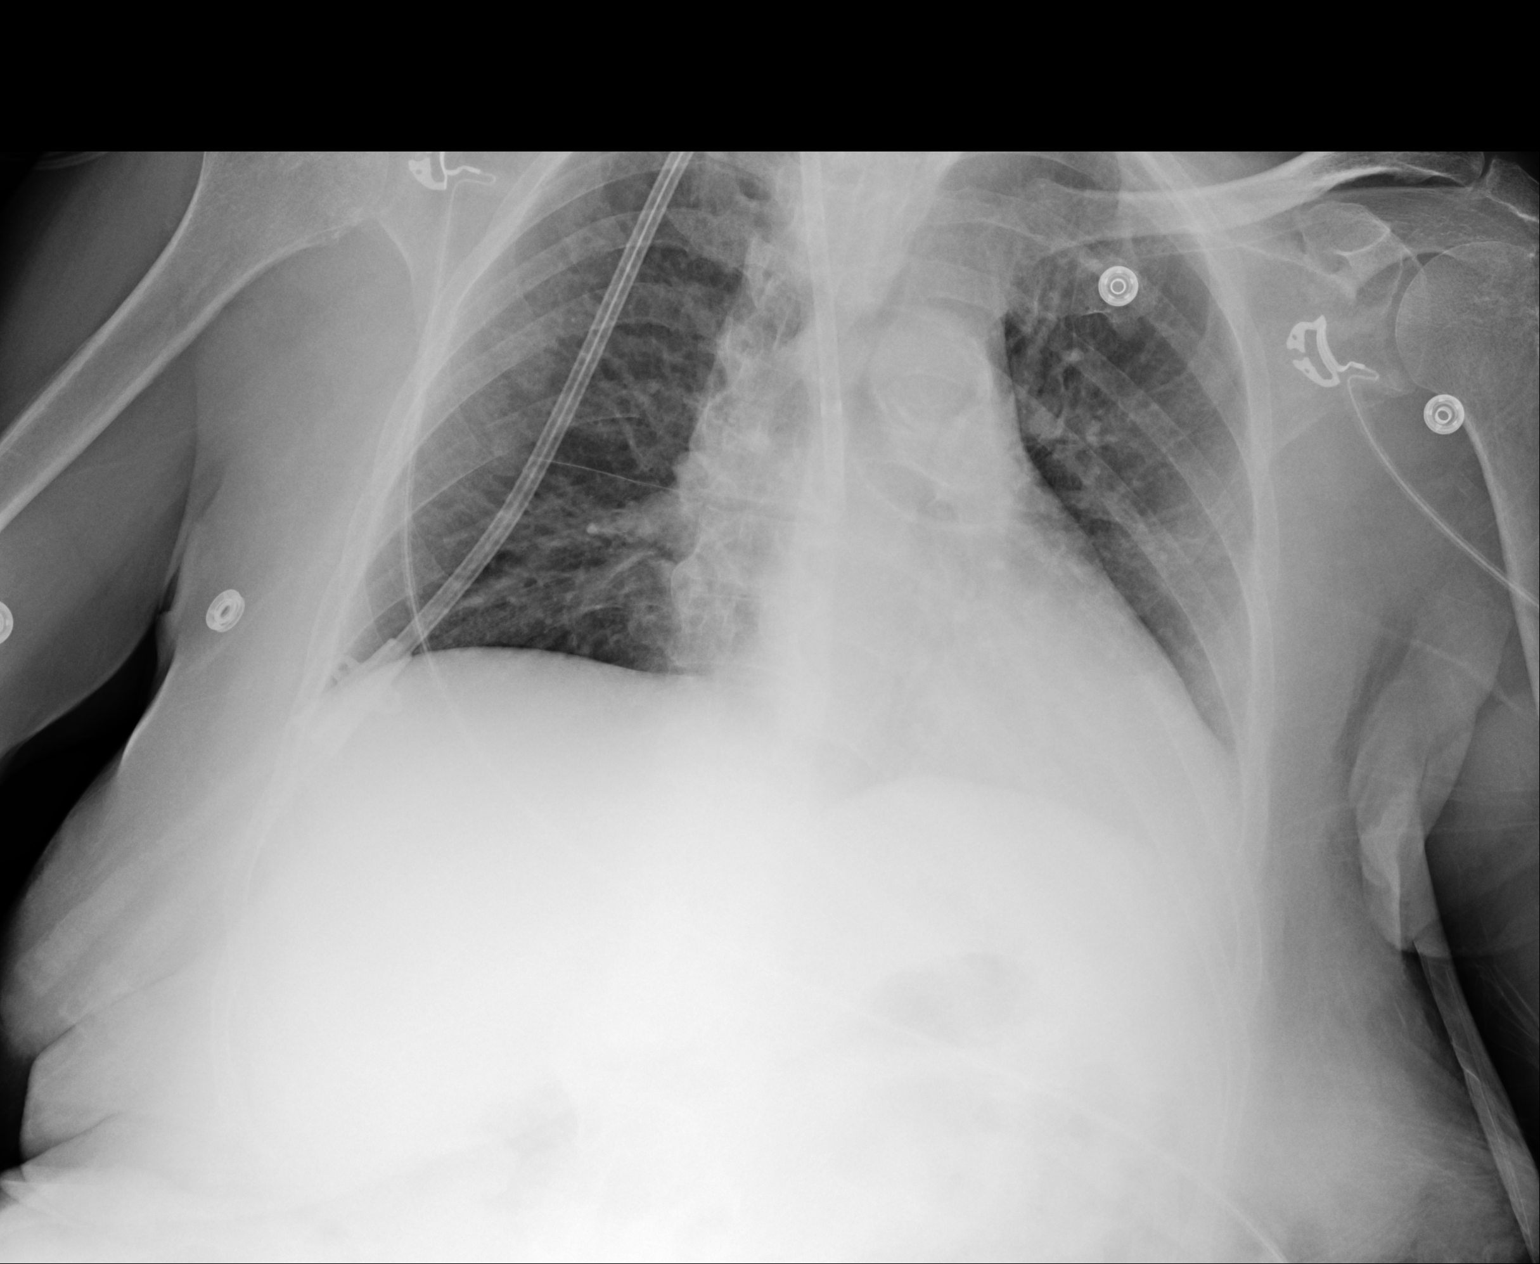

[1 of 1 positions shown; findings below may reference images not displayed]

FINDINGS: Right jugular central venous catheter tip at the cavoatrial junction
unchanged in position. No pneumothorax.

Mild left lower lobe atelectasis has developed in the interval. No
edema or effusion. Right lung clear
IMPRESSION: Central venous catheter tip at the cavoatrial junction unchanged.

Interval development of mild left lower lobe atelectasis.  No edema.

## 2020-08-08 ENCOUNTER — Encounter: Payer: Self-pay | Admitting: Cardiology

## 2020-08-09 DIAGNOSIS — E113293 Type 2 diabetes mellitus with mild nonproliferative diabetic retinopathy without macular edema, bilateral: Secondary | ICD-10-CM | POA: Diagnosis not present

## 2020-08-09 DIAGNOSIS — Z992 Dependence on renal dialysis: Secondary | ICD-10-CM | POA: Diagnosis not present

## 2020-08-09 DIAGNOSIS — N186 End stage renal disease: Secondary | ICD-10-CM | POA: Diagnosis not present

## 2020-08-12 DIAGNOSIS — N186 End stage renal disease: Secondary | ICD-10-CM | POA: Diagnosis not present

## 2020-08-12 DIAGNOSIS — Z992 Dependence on renal dialysis: Secondary | ICD-10-CM | POA: Diagnosis not present

## 2020-08-14 DIAGNOSIS — N186 End stage renal disease: Secondary | ICD-10-CM | POA: Diagnosis not present

## 2020-08-14 DIAGNOSIS — Z992 Dependence on renal dialysis: Secondary | ICD-10-CM | POA: Diagnosis not present

## 2020-08-16 DIAGNOSIS — Z992 Dependence on renal dialysis: Secondary | ICD-10-CM | POA: Diagnosis not present

## 2020-08-16 DIAGNOSIS — N186 End stage renal disease: Secondary | ICD-10-CM | POA: Diagnosis not present

## 2020-08-19 DIAGNOSIS — E119 Type 2 diabetes mellitus without complications: Secondary | ICD-10-CM | POA: Diagnosis not present

## 2020-08-19 DIAGNOSIS — N186 End stage renal disease: Secondary | ICD-10-CM | POA: Diagnosis not present

## 2020-08-19 DIAGNOSIS — Z992 Dependence on renal dialysis: Secondary | ICD-10-CM | POA: Diagnosis not present

## 2020-08-19 DIAGNOSIS — Z794 Long term (current) use of insulin: Secondary | ICD-10-CM | POA: Diagnosis not present

## 2020-08-21 DIAGNOSIS — Z992 Dependence on renal dialysis: Secondary | ICD-10-CM | POA: Diagnosis not present

## 2020-08-21 DIAGNOSIS — N186 End stage renal disease: Secondary | ICD-10-CM | POA: Diagnosis not present

## 2020-08-22 DIAGNOSIS — Z992 Dependence on renal dialysis: Secondary | ICD-10-CM | POA: Diagnosis not present

## 2020-08-22 DIAGNOSIS — E1122 Type 2 diabetes mellitus with diabetic chronic kidney disease: Secondary | ICD-10-CM | POA: Diagnosis not present

## 2020-08-22 DIAGNOSIS — N184 Chronic kidney disease, stage 4 (severe): Secondary | ICD-10-CM | POA: Diagnosis not present

## 2020-08-22 DIAGNOSIS — N186 End stage renal disease: Secondary | ICD-10-CM | POA: Diagnosis not present

## 2020-08-22 DIAGNOSIS — I129 Hypertensive chronic kidney disease with stage 1 through stage 4 chronic kidney disease, or unspecified chronic kidney disease: Secondary | ICD-10-CM | POA: Diagnosis not present

## 2020-08-23 DIAGNOSIS — N186 End stage renal disease: Secondary | ICD-10-CM | POA: Diagnosis not present

## 2020-08-23 DIAGNOSIS — Z992 Dependence on renal dialysis: Secondary | ICD-10-CM | POA: Diagnosis not present

## 2020-08-26 DIAGNOSIS — Z992 Dependence on renal dialysis: Secondary | ICD-10-CM | POA: Diagnosis not present

## 2020-08-26 DIAGNOSIS — N186 End stage renal disease: Secondary | ICD-10-CM | POA: Diagnosis not present

## 2020-08-28 DIAGNOSIS — Z992 Dependence on renal dialysis: Secondary | ICD-10-CM | POA: Diagnosis not present

## 2020-08-28 DIAGNOSIS — N186 End stage renal disease: Secondary | ICD-10-CM | POA: Diagnosis not present

## 2020-08-30 DIAGNOSIS — N186 End stage renal disease: Secondary | ICD-10-CM | POA: Diagnosis not present

## 2020-08-30 DIAGNOSIS — Z992 Dependence on renal dialysis: Secondary | ICD-10-CM | POA: Diagnosis not present

## 2020-09-02 DIAGNOSIS — N186 End stage renal disease: Secondary | ICD-10-CM | POA: Diagnosis not present

## 2020-09-02 DIAGNOSIS — Z992 Dependence on renal dialysis: Secondary | ICD-10-CM | POA: Diagnosis not present

## 2020-09-04 DIAGNOSIS — I639 Cerebral infarction, unspecified: Secondary | ICD-10-CM | POA: Diagnosis not present

## 2020-09-04 DIAGNOSIS — I330 Acute and subacute infective endocarditis: Secondary | ICD-10-CM | POA: Diagnosis not present

## 2020-09-04 DIAGNOSIS — G8194 Hemiplegia, unspecified affecting left nondominant side: Secondary | ICD-10-CM | POA: Diagnosis not present

## 2020-09-04 DIAGNOSIS — Z992 Dependence on renal dialysis: Secondary | ICD-10-CM | POA: Diagnosis not present

## 2020-09-04 DIAGNOSIS — N186 End stage renal disease: Secondary | ICD-10-CM | POA: Diagnosis not present

## 2020-09-06 DIAGNOSIS — N186 End stage renal disease: Secondary | ICD-10-CM | POA: Diagnosis not present

## 2020-09-06 DIAGNOSIS — Z992 Dependence on renal dialysis: Secondary | ICD-10-CM | POA: Diagnosis not present

## 2020-09-09 DIAGNOSIS — Z992 Dependence on renal dialysis: Secondary | ICD-10-CM | POA: Diagnosis not present

## 2020-09-09 DIAGNOSIS — N186 End stage renal disease: Secondary | ICD-10-CM | POA: Diagnosis not present

## 2020-09-11 DIAGNOSIS — Z992 Dependence on renal dialysis: Secondary | ICD-10-CM | POA: Diagnosis not present

## 2020-09-11 DIAGNOSIS — N186 End stage renal disease: Secondary | ICD-10-CM | POA: Diagnosis not present

## 2020-09-13 DIAGNOSIS — N186 End stage renal disease: Secondary | ICD-10-CM | POA: Diagnosis not present

## 2020-09-13 DIAGNOSIS — Z992 Dependence on renal dialysis: Secondary | ICD-10-CM | POA: Diagnosis not present

## 2020-09-16 DIAGNOSIS — Z992 Dependence on renal dialysis: Secondary | ICD-10-CM | POA: Diagnosis not present

## 2020-09-16 DIAGNOSIS — N186 End stage renal disease: Secondary | ICD-10-CM | POA: Diagnosis not present

## 2020-09-18 DIAGNOSIS — N186 End stage renal disease: Secondary | ICD-10-CM | POA: Diagnosis not present

## 2020-09-18 DIAGNOSIS — Z992 Dependence on renal dialysis: Secondary | ICD-10-CM | POA: Diagnosis not present

## 2020-09-20 DIAGNOSIS — E785 Hyperlipidemia, unspecified: Secondary | ICD-10-CM | POA: Diagnosis not present

## 2020-09-20 DIAGNOSIS — Z992 Dependence on renal dialysis: Secondary | ICD-10-CM | POA: Diagnosis not present

## 2020-09-20 DIAGNOSIS — N186 End stage renal disease: Secondary | ICD-10-CM | POA: Diagnosis not present

## 2020-09-21 DIAGNOSIS — N184 Chronic kidney disease, stage 4 (severe): Secondary | ICD-10-CM | POA: Diagnosis not present

## 2020-09-21 DIAGNOSIS — I129 Hypertensive chronic kidney disease with stage 1 through stage 4 chronic kidney disease, or unspecified chronic kidney disease: Secondary | ICD-10-CM | POA: Diagnosis not present

## 2020-09-21 DIAGNOSIS — E1122 Type 2 diabetes mellitus with diabetic chronic kidney disease: Secondary | ICD-10-CM | POA: Diagnosis not present

## 2020-09-22 DIAGNOSIS — Z992 Dependence on renal dialysis: Secondary | ICD-10-CM | POA: Diagnosis not present

## 2020-09-22 DIAGNOSIS — N186 End stage renal disease: Secondary | ICD-10-CM | POA: Diagnosis not present

## 2020-09-23 DIAGNOSIS — Z992 Dependence on renal dialysis: Secondary | ICD-10-CM | POA: Diagnosis not present

## 2020-09-23 DIAGNOSIS — N186 End stage renal disease: Secondary | ICD-10-CM | POA: Diagnosis not present

## 2020-09-25 DIAGNOSIS — Z992 Dependence on renal dialysis: Secondary | ICD-10-CM | POA: Diagnosis not present

## 2020-09-25 DIAGNOSIS — N186 End stage renal disease: Secondary | ICD-10-CM | POA: Diagnosis not present

## 2020-09-27 DIAGNOSIS — Z992 Dependence on renal dialysis: Secondary | ICD-10-CM | POA: Diagnosis not present

## 2020-09-27 DIAGNOSIS — N186 End stage renal disease: Secondary | ICD-10-CM | POA: Diagnosis not present

## 2020-09-30 DIAGNOSIS — Z992 Dependence on renal dialysis: Secondary | ICD-10-CM | POA: Diagnosis not present

## 2020-09-30 DIAGNOSIS — N186 End stage renal disease: Secondary | ICD-10-CM | POA: Diagnosis not present

## 2020-10-02 DIAGNOSIS — Z992 Dependence on renal dialysis: Secondary | ICD-10-CM | POA: Diagnosis not present

## 2020-10-02 DIAGNOSIS — N186 End stage renal disease: Secondary | ICD-10-CM | POA: Diagnosis not present

## 2020-10-04 DIAGNOSIS — Z992 Dependence on renal dialysis: Secondary | ICD-10-CM | POA: Diagnosis not present

## 2020-10-04 DIAGNOSIS — N186 End stage renal disease: Secondary | ICD-10-CM | POA: Diagnosis not present

## 2020-10-05 DIAGNOSIS — N186 End stage renal disease: Secondary | ICD-10-CM | POA: Diagnosis not present

## 2020-10-05 DIAGNOSIS — I330 Acute and subacute infective endocarditis: Secondary | ICD-10-CM | POA: Diagnosis not present

## 2020-10-05 DIAGNOSIS — G8194 Hemiplegia, unspecified affecting left nondominant side: Secondary | ICD-10-CM | POA: Diagnosis not present

## 2020-10-05 DIAGNOSIS — I639 Cerebral infarction, unspecified: Secondary | ICD-10-CM | POA: Diagnosis not present

## 2020-10-07 DIAGNOSIS — N186 End stage renal disease: Secondary | ICD-10-CM | POA: Diagnosis not present

## 2020-10-07 DIAGNOSIS — Z992 Dependence on renal dialysis: Secondary | ICD-10-CM | POA: Diagnosis not present

## 2020-10-09 DIAGNOSIS — N186 End stage renal disease: Secondary | ICD-10-CM | POA: Diagnosis not present

## 2020-10-09 DIAGNOSIS — Z992 Dependence on renal dialysis: Secondary | ICD-10-CM | POA: Diagnosis not present

## 2020-10-11 DIAGNOSIS — Z992 Dependence on renal dialysis: Secondary | ICD-10-CM | POA: Diagnosis not present

## 2020-10-11 DIAGNOSIS — N186 End stage renal disease: Secondary | ICD-10-CM | POA: Diagnosis not present

## 2020-10-14 DIAGNOSIS — Z992 Dependence on renal dialysis: Secondary | ICD-10-CM | POA: Diagnosis not present

## 2020-10-14 DIAGNOSIS — N186 End stage renal disease: Secondary | ICD-10-CM | POA: Diagnosis not present

## 2020-10-16 DIAGNOSIS — Z992 Dependence on renal dialysis: Secondary | ICD-10-CM | POA: Diagnosis not present

## 2020-10-16 DIAGNOSIS — N186 End stage renal disease: Secondary | ICD-10-CM | POA: Diagnosis not present

## 2020-10-18 DIAGNOSIS — Z992 Dependence on renal dialysis: Secondary | ICD-10-CM | POA: Diagnosis not present

## 2020-10-18 DIAGNOSIS — N186 End stage renal disease: Secondary | ICD-10-CM | POA: Diagnosis not present

## 2020-10-21 DIAGNOSIS — Z992 Dependence on renal dialysis: Secondary | ICD-10-CM | POA: Diagnosis not present

## 2020-10-21 DIAGNOSIS — N186 End stage renal disease: Secondary | ICD-10-CM | POA: Diagnosis not present

## 2020-10-22 DIAGNOSIS — N184 Chronic kidney disease, stage 4 (severe): Secondary | ICD-10-CM | POA: Diagnosis not present

## 2020-10-22 DIAGNOSIS — I129 Hypertensive chronic kidney disease with stage 1 through stage 4 chronic kidney disease, or unspecified chronic kidney disease: Secondary | ICD-10-CM | POA: Diagnosis not present

## 2020-10-22 DIAGNOSIS — Z992 Dependence on renal dialysis: Secondary | ICD-10-CM | POA: Diagnosis not present

## 2020-10-22 DIAGNOSIS — N186 End stage renal disease: Secondary | ICD-10-CM | POA: Diagnosis not present

## 2020-10-22 DIAGNOSIS — E1122 Type 2 diabetes mellitus with diabetic chronic kidney disease: Secondary | ICD-10-CM | POA: Diagnosis not present

## 2020-10-23 DIAGNOSIS — N186 End stage renal disease: Secondary | ICD-10-CM | POA: Diagnosis not present

## 2020-10-23 DIAGNOSIS — Z992 Dependence on renal dialysis: Secondary | ICD-10-CM | POA: Diagnosis not present

## 2020-10-25 DIAGNOSIS — Z992 Dependence on renal dialysis: Secondary | ICD-10-CM | POA: Diagnosis not present

## 2020-10-25 DIAGNOSIS — N186 End stage renal disease: Secondary | ICD-10-CM | POA: Diagnosis not present

## 2020-10-26 DIAGNOSIS — Z20822 Contact with and (suspected) exposure to covid-19: Secondary | ICD-10-CM | POA: Diagnosis not present

## 2020-10-26 DIAGNOSIS — R531 Weakness: Secondary | ICD-10-CM | POA: Diagnosis not present

## 2020-10-26 DIAGNOSIS — Z992 Dependence on renal dialysis: Secondary | ICD-10-CM | POA: Diagnosis not present

## 2020-10-26 DIAGNOSIS — N186 End stage renal disease: Secondary | ICD-10-CM | POA: Diagnosis not present

## 2020-10-26 DIAGNOSIS — I120 Hypertensive chronic kidney disease with stage 5 chronic kidney disease or end stage renal disease: Secondary | ICD-10-CM | POA: Diagnosis not present

## 2020-10-26 DIAGNOSIS — T50B95A Adverse effect of other viral vaccines, initial encounter: Secondary | ICD-10-CM | POA: Diagnosis not present

## 2020-10-26 DIAGNOSIS — E1122 Type 2 diabetes mellitus with diabetic chronic kidney disease: Secondary | ICD-10-CM | POA: Diagnosis not present

## 2020-10-26 DIAGNOSIS — R112 Nausea with vomiting, unspecified: Secondary | ICD-10-CM | POA: Diagnosis not present

## 2020-10-26 DIAGNOSIS — Z8673 Personal history of transient ischemic attack (TIA), and cerebral infarction without residual deficits: Secondary | ICD-10-CM | POA: Diagnosis not present

## 2020-10-28 DIAGNOSIS — Z992 Dependence on renal dialysis: Secondary | ICD-10-CM | POA: Diagnosis not present

## 2020-10-28 DIAGNOSIS — N186 End stage renal disease: Secondary | ICD-10-CM | POA: Diagnosis not present

## 2020-10-30 DIAGNOSIS — N186 End stage renal disease: Secondary | ICD-10-CM | POA: Diagnosis not present

## 2020-10-30 DIAGNOSIS — Z992 Dependence on renal dialysis: Secondary | ICD-10-CM | POA: Diagnosis not present

## 2020-11-01 DIAGNOSIS — Z992 Dependence on renal dialysis: Secondary | ICD-10-CM | POA: Diagnosis not present

## 2020-11-01 DIAGNOSIS — N186 End stage renal disease: Secondary | ICD-10-CM | POA: Diagnosis not present

## 2020-11-04 DIAGNOSIS — N186 End stage renal disease: Secondary | ICD-10-CM | POA: Diagnosis not present

## 2020-11-04 DIAGNOSIS — I330 Acute and subacute infective endocarditis: Secondary | ICD-10-CM | POA: Diagnosis not present

## 2020-11-04 DIAGNOSIS — G8194 Hemiplegia, unspecified affecting left nondominant side: Secondary | ICD-10-CM | POA: Diagnosis not present

## 2020-11-04 DIAGNOSIS — I639 Cerebral infarction, unspecified: Secondary | ICD-10-CM | POA: Diagnosis not present

## 2020-11-04 DIAGNOSIS — Z992 Dependence on renal dialysis: Secondary | ICD-10-CM | POA: Diagnosis not present

## 2020-11-06 DIAGNOSIS — Z992 Dependence on renal dialysis: Secondary | ICD-10-CM | POA: Diagnosis not present

## 2020-11-06 DIAGNOSIS — N186 End stage renal disease: Secondary | ICD-10-CM | POA: Diagnosis not present

## 2020-11-07 ENCOUNTER — Ambulatory Visit: Payer: Medicare PPO | Admitting: Adult Health

## 2020-11-08 DIAGNOSIS — N186 End stage renal disease: Secondary | ICD-10-CM | POA: Diagnosis not present

## 2020-11-08 DIAGNOSIS — Z992 Dependence on renal dialysis: Secondary | ICD-10-CM | POA: Diagnosis not present

## 2020-11-11 DIAGNOSIS — N186 End stage renal disease: Secondary | ICD-10-CM | POA: Diagnosis not present

## 2020-11-11 DIAGNOSIS — Z992 Dependence on renal dialysis: Secondary | ICD-10-CM | POA: Diagnosis not present

## 2020-11-13 ENCOUNTER — Encounter: Payer: Self-pay | Admitting: Adult Health

## 2020-11-13 ENCOUNTER — Ambulatory Visit: Payer: Medicare PPO | Admitting: Adult Health

## 2020-11-13 DIAGNOSIS — Z992 Dependence on renal dialysis: Secondary | ICD-10-CM | POA: Diagnosis not present

## 2020-11-13 DIAGNOSIS — N186 End stage renal disease: Secondary | ICD-10-CM | POA: Diagnosis not present

## 2020-11-13 NOTE — Progress Notes (Deleted)
[facility] [address] [place]. [place] [address] [phone]       STROKE FOLLOW UP NOTE  [name] Date of Birth:  1951-02-23 Medical Record Number:  [id number]   Reason for Referral: stroke follow up    CHIEF COMPLAINT:  No chief complaint on file.   HPI:  Today, 11/13/2020, [name] returns for 49-month stroke and seizure follow-up.  Stable from stroke standpoint with residual left hemiparesis.  Denies new or worsening stroke/TIA symptoms.  At prior visit, she remained on Depakote 250 mg twice daily although prescribed to take 3 times daily after questionable seizure activity on 04/21/2020.  As she has been stable without any recurrence, continue twice daily dosing but unfortunately had additional seizure on 05/31/2020 after HD session with subtherapeutic Depakote level therefore advised to take Depakote 250 mg 3 times daily as previously prescribed.  She has not had any additional seizure activity since that time and remains on Depakote 200 mg 3 times daily without side effects.  She has remained on Eliquis for chronic DVT of the R IJ and denies bleeding or bruising.  Routine follow-up with cardiology and cardiothoracic surgery with 2D echo 04/28/2020 showed trace mitral vegetation and vegetations have resolved.  She has remained on Eliquis per cardiology recommendations.  Blood pressure today ***.      History provided for reference purposes only Update 05/06/2020 JM: 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  She is accompanied by a friend who does not participate in conversation.  Stable from a stroke standpoint with residual left sided weakness. She also continues to experience blurred vision but has not been seen by ophthalmology yet.  Denies worsening or new stroke/TIA symptoms.  She remains nonambulatory due to chronic pain.   Recently seen in ED on 04/21/2020 with nausea, vomiting and diarrhea with significant hyperkalemia with potassium 7.5 as well as reported transient episodes of staring spells, unresponsiveness followed by brief period of confusion.  EEG no evidence of seizure activity.  Questionable partial complex seizures with initiation of Depakote 250 mg twice daily (prescribed every 8 hours but only taking twice daily).  She denies reoccurring episodes and tolerating Depakote without side effects. She has continued on eliquis without bleeding or bruising for MV thrombus and R IJ DVT.  She does have follow-up with cardiology with repeat echo on 05/09/2020.  Blood pressure today 128/76.  No concerns at this time.  Initial visit 01/23/2020 JM: [name] is a 69 year old female who is being seen today for hospital follow-up after prolonged hospitalization from 10/20/2019 -12/05/2019.  Residual stroke deficits of left-sided weakness and recently transition to outpatient physical therapy.  She has made great improvement in regards to LUE but continues to have weakness LLE and difficulty with recovery due to left-sided sciatica.  She is nonambulatory at this time and transfers via wheelchair.  Continues on Eliquis without bleeding or bruising for acute DVT and due to lack of insurance coverage, will be transitioning to warfarin for additional 4 to 6 weeks for total 14-month duration.  INR levels planned to be monitored by PCP.  Blood pressure today 154/96.  Continues to be followed by physical medicine and rehab Dr. Ranell Patrick for rehab needs and management of left-sided sciatica felt secondary to neurogenic claudication with impingement of L5 nerve root.  Continues on gabapentin 300 mg twice daily prescribed by physical medicine and rehab.  Complains of left eye blurred  vision and dizziness with horizontal and vertical movements.  She has been referred to ophthalmology but has not scheduled appointment at this time.  She does have  appointment scheduled tomorrow with ID for hospital follow-up.  No further concerns at this time.  Stroke admission 10/20/2019: Ms.[name] a 69 y.o.femalewith history of ESRD ( on dialysis M-W-F), L3-4 discitis/osteomyelitis and Pseudomonas bacteremia (06/2019), TIA 2010, HTN, HLD, Ha, DM2who presented to [facility] on 10/20/2019 with c/o dizziness, left arm weakness and left leg numbness.Shedid not receive IV t-PA due to late presentation (>4.5 hours from time of onset).  Shortly transferred to [facility] ED and evaluated by stroke team and Dr. Erlinda Hong with stroke work-up revealing acute right BG, CR and caudate scattered infarcts embolic pattern secondary to MVA vegetation/endocarditis.  On 12/4, found to have altered mental status with repeat CT head showing new small frontal lobe white matter infarct new from prior imaging.  Bilateral upper extremity venous Doppler showed right IJ acute DVT, and right brachial vein age indeterminate DVT.  Lower extremity venous Doppler negative.  TEE showed large vegetation of anterior MV leaflet.  Previously on aspirin and Plavix and transition to warfarin recommended by primary team.  Right IJ acute DVT felt to be related to dialysis catheter with removal of catheter for line holiday.  Previously right arm AV graft removed 04/2019 therefore had tunneled catheter placed to continue HD treatments.  Blood cultures positive for Pseudomonas and initiated on cefepime with prior Pseudomonas infection 06/2019 and repeat blood culture 08/2019.  Also noted for MV endocarditis, placed on antibiotics per ID recommendations.  History of HTN stable.  LDL 65.  DM type II with A1c 7.6.  Other stroke risk factors include advanced age and prior history of TIA 2010.  Residual deficits of left hemiparesis and discharged to CIR on 11/01/2019 for ongoing therapy needs.  During CIR admission, difficulty managing INR levels therefore transition to Eliquis which was recommended for total  duration of 3 months for right IJ acute DVT.  Also noted questionable seizure, possibly related to antibiotics.  She continued on both Cipro and Fortaz through 12/06/2019.  Also noted to have steady progression of L3-4 discitis/osteomyelitis with further imaging showing destruction of L3 and L4 vertebral bodies evaluated by neurosurgery without recommended interventions.  She was discharged on 12/05/2019 home with recommendation of home health therapies.    ROS:   14 system review of systems performed and negative with exception of pain, weakness, decreased vision  PMH:  Past Medical History:  Diagnosis Date  . Arthritis    hands  . Constipation   . Diabetes mellitus without complication (HCC)    Type II  . ESRD (end stage renal disease) ()     M/W/F- Hemodialysis  . GERD (gastroesophageal reflux disease)   . Headache   . Hyperlipidemia   . Hypertension   . Irregular heart rate   . Stroke [facility])    TIA - approx 2010- no residual    PSH:  Past Surgical History:  Procedure Laterality Date  . ABDOMINAL HYSTERECTOMY    . AORTIC ARCH ANGIOGRAPHY N/A 03/28/2019   Procedure: AORTIC ARCH ANGIOGRAPHY;  Surgeon: Waynetta Sandy, MD;  Location: [place] [facility];  Service: Cardiovascular;  Laterality: N/A;  . AV FISTULA PLACEMENT Left 06/13/2018   Procedure: ARTERIOVENOUS (AV) FISTULA CREATION;  Surgeon: Rosetta Posner, MD;  Location: [facility] OR;  Service: Vascular;  Laterality: Left;  . AV FISTULA PLACEMENT Left 08/18/2018   Procedure: INSERTION  OF 4-7MM X 45CM ARTERIOVENOUS (AV) GORE-TEX GRAFT LEFT  FOREARM;  Surgeon: Rosetta Posner, MD;  Location: [place];  Service: Vascular;  Laterality: Left;  . AV FISTULA PLACEMENT Right 04/06/2019   Procedure: INSERTION OF ARTERIOVENOUS (AV) GORE-TEX GRAFT RIGHT UPPER ARM;  Surgeon: Waynetta Sandy, MD;  Location: [place];  Service: Vascular;  Laterality: Right;  . AV FISTULA PLACEMENT Right 04/13/2019   Procedure: INSERTION OF ARTERIOVENOUS  (AV) BOVINE  ARTEGRAFT GRAFT RIGHT UPPER EXTREMITY;  Surgeon: Serafina Mitchell, MD;  Location: [place];  Service: Vascular;  Laterality: Right;  . [place] REMOVAL Right 05/16/2019   Procedure: REMOVAL OF ARTERIOVENOUS GORETEX GRAFT ([place]) RIGHT ARM;  Surgeon: Angelia Mould, MD;  Location: [place];  Service: Vascular;  Laterality: Right;  . BASCILIC VEIN TRANSPOSITION Right 03/07/2019   Procedure: First Stage Bascilic Vein Transposition Right Arm;  Surgeon: Serafina Mitchell, MD;  Location: [place];  Service: Vascular;  Laterality: Right;  . CATARACT EXTRACTION Right 2005  . INSERTION OF DIALYSIS CATHETER N/A 08/18/2018   Procedure: INSERTION OF DIALYSIS CATHETER;  Surgeon: Rosetta Posner, MD;  Location: [place];  Service: Vascular;  Laterality: N/A;  . INSERTION OF DIALYSIS CATHETER Right 07/25/2019   Procedure: INSERTION OF DIALYSIS CATHETER RIGHT INTERNAL JUGULAR (TUNNELED);  Surgeon: Virl Cagey, MD;  Location: [facility];  Service: General;  Laterality: Right;  . IR FLUORO GUIDE CV LINE LEFT  10/26/2019  . IR FLUORO GUIDE CV LINE RIGHT  12/06/2018  . IR PTA ADDL CENTRAL DIALYSIS SEG THRU DIALY CIRCUIT RIGHT Right 12/06/2018  . IR REMOVAL TUN CV CATH W/O FL  10/23/2019  . IR US GUIDE VASC ACCESS LEFT  10/26/2019  . TEE WITHOUT CARDIOVERSION N/A 10/26/2019   Procedure: TRANSESOPHAGEAL ECHOCARDIOGRAM (TEE);  Surgeon: Lelon Perla, MD;  Location: [place];  Service: Cardiovascular;  Laterality: N/A;  . THROMBECTOMY AND REVISION OF ARTERIOVENTOUS (AV) GORETEX  GRAFT Left 09/29/2018   Procedure: INSERTION OF ARTERIOVENTOUS (AV) GORETEX  GRAFT ARM;  Surgeon: Waynetta Sandy, MD;  Location: [place];  Service: Vascular;  Laterality: Left;  . UPPER EXTREMITY ANGIOGRAPHY Right 03/28/2019   Procedure: UPPER EXTREMITY ANGIOGRAPHY;  Surgeon: Waynetta Sandy, MD;  Location: [place] [facility];  Service: Cardiovascular;  Laterality: Right;  . VENOGRAM  10/27/2018   Procedure: Venogram;   Surgeon: Marty Heck, MD;  Location: [place] [facility];  Service: Cardiovascular;;  bilateral arm    Social History:  Social History   Socioeconomic History  . Marital status: Single    Spouse name: Not on file  . Number of children: Not on file  . Years of education: Not on file  . Highest education level: Not on file  Occupational History  . Not on file  Tobacco Use  . Smoking status: Never Smoker  . Smokeless tobacco: Never Used  Vaping Use  . Vaping Use: Never used  Substance and Sexual Activity  . Alcohol use: Never  . Drug use: Never  . Sexual activity: Not on file  Other Topics Concern  . Not on file  Social History Narrative  . Not on file   Social Determinants of Health   Financial Resource Strain: Not on file  Food Insecurity: Not on file  Transportation Needs: Not on file  Physical Activity: Not on file  Stress: Not on file  Social Connections: Not on file  Intimate Partner Violence: Not on file    Family History:  Family History  Problem Relation Age of Onset  . Heart murmur Mother   . Heart failure Mother   . Diabetes Mother   . Cirrhosis Father   . Alcoholism Father     Medications:   Current Outpatient Medications on File Prior to Visit  Medication Sig Dispense Refill  . acetaminophen (TYLENOL) 325 MG tablet Take 2 tablets (650 mg total) by mouth every 12 (twelve) hours as needed for mild pain (or temp > 37.5 C (99.5 F)).    Marland Kitchen apixaban (ELIQUIS) 5 MG TABS tablet Take 1 tablet (5 mg total) by mouth 2 (two) times daily. 60 tablet 1  . carvedilol (COREG) 12.5 MG tablet Take 1 tablet (12.5 mg total) by mouth 2 (two) times daily. 60 tablet 1  . Darbepoetin Alfa (ARANESP) 100 MCG/0.5ML SOSY injection Inject 0.5 mLs (100 mcg total) into the vein every Monday with hemodialysis. 4.2 mL   . diclofenac Sodium (VOLTAREN) 1 % GEL Apply 2 g topically 4 (four) times daily. 300 g 1  . divalproex (DEPAKOTE) 250 MG DR tablet Take 1 tablet (250 mg total)  by mouth 2 (two) times daily. 180 tablet 3  . doxercalciferol (HECTOROL) 4 MCG/2ML injection Inject 0.75 mLs (1.5 mcg total) into the vein every Monday, Wednesday, and Friday with hemodialysis. 2 mL 0  . ferric gluconate 125 mg in sodium chloride 0.9 % 100 mL Inject 125 mg into the vein every Monday.    . gabapentin (NEURONTIN) 300 MG capsule Take 1 capsule (300 mg total) by mouth daily as needed (nerve pain).    . isosorbide mononitrate (IMDUR) 60 MG 24 hr tablet Take 1 tablet (60 mg total) by mouth daily. 60 tablet 1  . multivitamin (RENA-VIT) TABS tablet Take 1 tablet by mouth daily after lunch. 30 tablet 0  . pantoprazole (PROTONIX) 40 MG tablet Take 1 tablet (40 mg total) by mouth daily. 30 tablet 0   No current facility-administered medications on file prior to visit.    Allergies:  No Known Allergies   Physical Exam  There were no vitals filed for this visit. There is no height or weight on file to calculate BMI. No exam data present   General: Frail pleasant elderly African-American female, seated, in no apparent distress Head: head normocephalic and atraumatic.   Neck: supple with no carotid or supraclavicular bruits Cardiovascular: regular rate and rhythm, no murmurs Musculoskeletal: no deformity Skin:  no rash/petichiae Vascular:  Normal pulses all extremities   Neurologic Exam Mental Status: Awake and fully alert.   Normal speech and language.  Oriented to place and time. Recent and remote memory intact. Attention span, concentration and fund of knowledge appropriate. Mood and affect appropriate.  Cranial Nerves: Pupils equal, briskly reactive to light. Extraocular movements full without nystagmus. Visual fields full to confrontation. Hearing intact. Facial sensation intact.  Facial symmetry noted. Motor: Normal bulk and tone.  LUE: 5/5 with decreased left hand dexterity  LLE: 4/5 hip flexor; 5/5 distal Full strength right upper and lower extremity Sensory.: intact to  touch , pinprick , position and vibratory sensation.  Coordination: Rapid alternating movements normal in all extremities except slightly decreased left hand. Finger-to-nose performed accurately bilaterally and heel-to-shin performed accurately on right side but unable to do on the left side due to back pain Gait and Station: Deferred as patient nonambulatory Reflexes: 1+ and symmetric. Toes downgoing.          ASSESSMENT: [name] is a 69 y.o. year old female presented with dizziness, left  arm weakness and left leg numbness on 10/20/2019 with stroke work-up revealing acute right BG, CR, and caudate scattered infarcts embolic pattern secondary to MV vegetation/endocarditis as evidenced on TEE. Vascular risk factors include acute IJ DVT, right brachial vein age indeterminate DVT, ESRD on HD, recurrent Pseudomonas bacteremia, MV endocarditis, HTN, and history of TIA in 2010.      PLAN:  1. Embolic stroke: Residual deficits: Mild left hemiparesis.  Continue Eliquis for acute IJ DVT for secondary stroke prevention.  Advised ongoing follow-up with cardiology to determine further duration and need of Eliquis.  Discussed secondary stroke prevention measures and importance of close PCP follow-up for aggressive stroke risk factor management 2. Partial complex seizure: Initial onset 04/21/2020.  Seizure event 05/31/2020 after HD session and advised to increase Depakote dosage 250 mg from twice daily dosing to 3 times daily.  3. HTN: BP goal<130/90.  Stable on carvedilol and Imdur 4. DMII: A1c goal<7.0.  Stable on nonpharmacological management 5. Acute IJ DVT: Remains on Eliquis at this time despite 57-month recommendation.  Advised to follow-up with cardiology in regards to ongoing use of Eliquis.  If discontinued, would recommend initiating aspirin 81 mg daily for secondary stroke prevention 6. MV endocarditis/vegetation: Continue to follow with cardiology with repeat echocardiogram on  05/09/2020    Follow up in 6 months or call earlier if needed   I spent 35 minutes of face-to-face and non-face-to-face time with patient.  This included previsit chart review, lab review, study review, order entry, electronic health record documentation, patient education    Frann Rider, [facility]  [facility] [address] [place], [place] [address]  Phone [phone] Fax [phone] Note: This document was prepared with digital dictation and possible smart phrase technology. Any transcriptional errors that result from this process are unintentional.

## 2020-11-15 DIAGNOSIS — N186 End stage renal disease: Secondary | ICD-10-CM | POA: Diagnosis not present

## 2020-11-15 DIAGNOSIS — Z992 Dependence on renal dialysis: Secondary | ICD-10-CM | POA: Diagnosis not present

## 2020-11-18 DIAGNOSIS — Z992 Dependence on renal dialysis: Secondary | ICD-10-CM | POA: Diagnosis not present

## 2020-11-18 DIAGNOSIS — N186 End stage renal disease: Secondary | ICD-10-CM | POA: Diagnosis not present

## 2020-11-18 DIAGNOSIS — E119 Type 2 diabetes mellitus without complications: Secondary | ICD-10-CM | POA: Diagnosis not present

## 2020-11-18 DIAGNOSIS — Z794 Long term (current) use of insulin: Secondary | ICD-10-CM | POA: Diagnosis not present

## 2020-11-20 DIAGNOSIS — Z992 Dependence on renal dialysis: Secondary | ICD-10-CM | POA: Diagnosis not present

## 2020-11-20 DIAGNOSIS — N186 End stage renal disease: Secondary | ICD-10-CM | POA: Diagnosis not present

## 2020-11-22 DIAGNOSIS — Z992 Dependence on renal dialysis: Secondary | ICD-10-CM | POA: Diagnosis not present

## 2020-11-22 DIAGNOSIS — N186 End stage renal disease: Secondary | ICD-10-CM | POA: Diagnosis not present

## 2020-11-22 DIAGNOSIS — E1122 Type 2 diabetes mellitus with diabetic chronic kidney disease: Secondary | ICD-10-CM | POA: Diagnosis not present

## 2020-11-22 DIAGNOSIS — N184 Chronic kidney disease, stage 4 (severe): Secondary | ICD-10-CM | POA: Diagnosis not present

## 2020-11-22 DIAGNOSIS — I129 Hypertensive chronic kidney disease with stage 1 through stage 4 chronic kidney disease, or unspecified chronic kidney disease: Secondary | ICD-10-CM | POA: Diagnosis not present

## 2020-11-25 DIAGNOSIS — N186 End stage renal disease: Secondary | ICD-10-CM | POA: Diagnosis not present

## 2020-11-25 DIAGNOSIS — Z992 Dependence on renal dialysis: Secondary | ICD-10-CM | POA: Diagnosis not present

## 2020-11-27 DIAGNOSIS — N186 End stage renal disease: Secondary | ICD-10-CM | POA: Diagnosis not present

## 2020-11-27 DIAGNOSIS — Z992 Dependence on renal dialysis: Secondary | ICD-10-CM | POA: Diagnosis not present

## 2020-11-29 DIAGNOSIS — Z992 Dependence on renal dialysis: Secondary | ICD-10-CM | POA: Diagnosis not present

## 2020-11-29 DIAGNOSIS — N186 End stage renal disease: Secondary | ICD-10-CM | POA: Diagnosis not present

## 2020-12-02 DIAGNOSIS — N186 End stage renal disease: Secondary | ICD-10-CM | POA: Diagnosis not present

## 2020-12-02 DIAGNOSIS — Z992 Dependence on renal dialysis: Secondary | ICD-10-CM | POA: Diagnosis not present

## 2020-12-04 DIAGNOSIS — Z992 Dependence on renal dialysis: Secondary | ICD-10-CM | POA: Diagnosis not present

## 2020-12-04 DIAGNOSIS — N186 End stage renal disease: Secondary | ICD-10-CM | POA: Diagnosis not present

## 2020-12-05 DIAGNOSIS — G8194 Hemiplegia, unspecified affecting left nondominant side: Secondary | ICD-10-CM | POA: Diagnosis not present

## 2020-12-05 DIAGNOSIS — N186 End stage renal disease: Secondary | ICD-10-CM | POA: Diagnosis not present

## 2020-12-05 DIAGNOSIS — I639 Cerebral infarction, unspecified: Secondary | ICD-10-CM | POA: Diagnosis not present

## 2020-12-05 DIAGNOSIS — I330 Acute and subacute infective endocarditis: Secondary | ICD-10-CM | POA: Diagnosis not present

## 2020-12-06 DIAGNOSIS — Z992 Dependence on renal dialysis: Secondary | ICD-10-CM | POA: Diagnosis not present

## 2020-12-06 DIAGNOSIS — N186 End stage renal disease: Secondary | ICD-10-CM | POA: Diagnosis not present

## 2020-12-09 DIAGNOSIS — Z992 Dependence on renal dialysis: Secondary | ICD-10-CM | POA: Diagnosis not present

## 2020-12-09 DIAGNOSIS — N186 End stage renal disease: Secondary | ICD-10-CM | POA: Diagnosis not present

## 2020-12-11 DIAGNOSIS — N186 End stage renal disease: Secondary | ICD-10-CM | POA: Diagnosis not present

## 2020-12-11 DIAGNOSIS — Z992 Dependence on renal dialysis: Secondary | ICD-10-CM | POA: Diagnosis not present

## 2020-12-13 DIAGNOSIS — Z992 Dependence on renal dialysis: Secondary | ICD-10-CM | POA: Diagnosis not present

## 2020-12-13 DIAGNOSIS — N186 End stage renal disease: Secondary | ICD-10-CM | POA: Diagnosis not present

## 2020-12-16 DIAGNOSIS — Z992 Dependence on renal dialysis: Secondary | ICD-10-CM | POA: Diagnosis not present

## 2020-12-16 DIAGNOSIS — N186 End stage renal disease: Secondary | ICD-10-CM | POA: Diagnosis not present

## 2020-12-18 DIAGNOSIS — Z992 Dependence on renal dialysis: Secondary | ICD-10-CM | POA: Diagnosis not present

## 2020-12-18 DIAGNOSIS — N186 End stage renal disease: Secondary | ICD-10-CM | POA: Diagnosis not present

## 2020-12-20 DIAGNOSIS — N186 End stage renal disease: Secondary | ICD-10-CM | POA: Diagnosis not present

## 2020-12-20 DIAGNOSIS — Z992 Dependence on renal dialysis: Secondary | ICD-10-CM | POA: Diagnosis not present

## 2020-12-23 DIAGNOSIS — Z992 Dependence on renal dialysis: Secondary | ICD-10-CM | POA: Diagnosis not present

## 2020-12-23 DIAGNOSIS — N186 End stage renal disease: Secondary | ICD-10-CM | POA: Diagnosis not present

## 2020-12-25 DIAGNOSIS — Z992 Dependence on renal dialysis: Secondary | ICD-10-CM | POA: Diagnosis not present

## 2020-12-25 DIAGNOSIS — N186 End stage renal disease: Secondary | ICD-10-CM | POA: Diagnosis not present

## 2020-12-27 DIAGNOSIS — N186 End stage renal disease: Secondary | ICD-10-CM | POA: Diagnosis not present

## 2020-12-27 DIAGNOSIS — Z992 Dependence on renal dialysis: Secondary | ICD-10-CM | POA: Diagnosis not present

## 2020-12-30 DIAGNOSIS — Z992 Dependence on renal dialysis: Secondary | ICD-10-CM | POA: Diagnosis not present

## 2020-12-30 DIAGNOSIS — G4700 Insomnia, unspecified: Secondary | ICD-10-CM | POA: Diagnosis not present

## 2020-12-30 DIAGNOSIS — M179 Osteoarthritis of knee, unspecified: Secondary | ICD-10-CM | POA: Diagnosis not present

## 2020-12-30 DIAGNOSIS — J302 Other seasonal allergic rhinitis: Secondary | ICD-10-CM | POA: Diagnosis not present

## 2020-12-30 DIAGNOSIS — N2581 Secondary hyperparathyroidism of renal origin: Secondary | ICD-10-CM | POA: Diagnosis not present

## 2020-12-30 DIAGNOSIS — H9193 Unspecified hearing loss, bilateral: Secondary | ICD-10-CM | POA: Diagnosis not present

## 2020-12-30 DIAGNOSIS — N186 End stage renal disease: Secondary | ICD-10-CM | POA: Diagnosis not present

## 2021-01-01 DIAGNOSIS — N186 End stage renal disease: Secondary | ICD-10-CM | POA: Diagnosis not present

## 2021-01-01 DIAGNOSIS — Z992 Dependence on renal dialysis: Secondary | ICD-10-CM | POA: Diagnosis not present

## 2021-01-03 DIAGNOSIS — N186 End stage renal disease: Secondary | ICD-10-CM | POA: Diagnosis not present

## 2021-01-03 DIAGNOSIS — Z992 Dependence on renal dialysis: Secondary | ICD-10-CM | POA: Diagnosis not present

## 2021-01-06 DIAGNOSIS — N186 End stage renal disease: Secondary | ICD-10-CM | POA: Diagnosis not present

## 2021-01-06 DIAGNOSIS — Z992 Dependence on renal dialysis: Secondary | ICD-10-CM | POA: Diagnosis not present

## 2021-01-08 DIAGNOSIS — Z992 Dependence on renal dialysis: Secondary | ICD-10-CM | POA: Diagnosis not present

## 2021-01-08 DIAGNOSIS — N186 End stage renal disease: Secondary | ICD-10-CM | POA: Diagnosis not present

## 2021-01-09 DIAGNOSIS — E1122 Type 2 diabetes mellitus with diabetic chronic kidney disease: Secondary | ICD-10-CM | POA: Diagnosis not present

## 2021-01-09 DIAGNOSIS — K219 Gastro-esophageal reflux disease without esophagitis: Secondary | ICD-10-CM | POA: Diagnosis not present

## 2021-01-09 DIAGNOSIS — Z1321 Encounter for screening for nutritional disorder: Secondary | ICD-10-CM | POA: Diagnosis not present

## 2021-01-09 DIAGNOSIS — E7800 Pure hypercholesterolemia, unspecified: Secondary | ICD-10-CM | POA: Diagnosis not present

## 2021-01-09 DIAGNOSIS — E1165 Type 2 diabetes mellitus with hyperglycemia: Secondary | ICD-10-CM | POA: Diagnosis not present

## 2021-01-09 DIAGNOSIS — I10 Essential (primary) hypertension: Secondary | ICD-10-CM | POA: Diagnosis not present

## 2021-01-09 DIAGNOSIS — E7801 Familial hypercholesterolemia: Secondary | ICD-10-CM | POA: Diagnosis not present

## 2021-01-09 DIAGNOSIS — E559 Vitamin D deficiency, unspecified: Secondary | ICD-10-CM | POA: Diagnosis not present

## 2021-01-10 DIAGNOSIS — N186 End stage renal disease: Secondary | ICD-10-CM | POA: Diagnosis not present

## 2021-01-10 DIAGNOSIS — Z992 Dependence on renal dialysis: Secondary | ICD-10-CM | POA: Diagnosis not present

## 2021-01-13 DIAGNOSIS — Z992 Dependence on renal dialysis: Secondary | ICD-10-CM | POA: Diagnosis not present

## 2021-01-13 DIAGNOSIS — N186 End stage renal disease: Secondary | ICD-10-CM | POA: Diagnosis not present

## 2021-01-14 DIAGNOSIS — Z86718 Personal history of other venous thrombosis and embolism: Secondary | ICD-10-CM | POA: Diagnosis not present

## 2021-01-14 DIAGNOSIS — E1122 Type 2 diabetes mellitus with diabetic chronic kidney disease: Secondary | ICD-10-CM | POA: Diagnosis not present

## 2021-01-14 DIAGNOSIS — N186 End stage renal disease: Secondary | ICD-10-CM | POA: Diagnosis not present

## 2021-01-14 DIAGNOSIS — G8190 Hemiplegia, unspecified affecting unspecified side: Secondary | ICD-10-CM | POA: Diagnosis not present

## 2021-01-14 DIAGNOSIS — E559 Vitamin D deficiency, unspecified: Secondary | ICD-10-CM | POA: Diagnosis not present

## 2021-01-14 DIAGNOSIS — R569 Unspecified convulsions: Secondary | ICD-10-CM | POA: Diagnosis not present

## 2021-01-14 DIAGNOSIS — E162 Hypoglycemia, unspecified: Secondary | ICD-10-CM | POA: Diagnosis not present

## 2021-01-14 DIAGNOSIS — K219 Gastro-esophageal reflux disease without esophagitis: Secondary | ICD-10-CM | POA: Diagnosis not present

## 2021-01-15 DIAGNOSIS — N186 End stage renal disease: Secondary | ICD-10-CM | POA: Diagnosis not present

## 2021-01-15 DIAGNOSIS — Z992 Dependence on renal dialysis: Secondary | ICD-10-CM | POA: Diagnosis not present

## 2021-01-17 DIAGNOSIS — Z992 Dependence on renal dialysis: Secondary | ICD-10-CM | POA: Diagnosis not present

## 2021-01-17 DIAGNOSIS — N186 End stage renal disease: Secondary | ICD-10-CM | POA: Diagnosis not present

## 2021-01-20 DIAGNOSIS — N184 Chronic kidney disease, stage 4 (severe): Secondary | ICD-10-CM | POA: Diagnosis not present

## 2021-01-20 DIAGNOSIS — Z992 Dependence on renal dialysis: Secondary | ICD-10-CM | POA: Diagnosis not present

## 2021-01-20 DIAGNOSIS — N186 End stage renal disease: Secondary | ICD-10-CM | POA: Diagnosis not present

## 2021-01-20 DIAGNOSIS — I129 Hypertensive chronic kidney disease with stage 1 through stage 4 chronic kidney disease, or unspecified chronic kidney disease: Secondary | ICD-10-CM | POA: Diagnosis not present

## 2021-01-20 DIAGNOSIS — E1122 Type 2 diabetes mellitus with diabetic chronic kidney disease: Secondary | ICD-10-CM | POA: Diagnosis not present

## 2021-01-22 DIAGNOSIS — N186 End stage renal disease: Secondary | ICD-10-CM | POA: Diagnosis not present

## 2021-01-22 DIAGNOSIS — Z992 Dependence on renal dialysis: Secondary | ICD-10-CM | POA: Diagnosis not present

## 2021-01-24 DIAGNOSIS — N186 End stage renal disease: Secondary | ICD-10-CM | POA: Diagnosis not present

## 2021-01-24 DIAGNOSIS — Z992 Dependence on renal dialysis: Secondary | ICD-10-CM | POA: Diagnosis not present

## 2021-01-27 DIAGNOSIS — N186 End stage renal disease: Secondary | ICD-10-CM | POA: Diagnosis not present

## 2021-01-27 DIAGNOSIS — Z992 Dependence on renal dialysis: Secondary | ICD-10-CM | POA: Diagnosis not present

## 2021-01-29 DIAGNOSIS — N186 End stage renal disease: Secondary | ICD-10-CM | POA: Diagnosis not present

## 2021-01-29 DIAGNOSIS — Z992 Dependence on renal dialysis: Secondary | ICD-10-CM | POA: Diagnosis not present

## 2021-01-31 DIAGNOSIS — N186 End stage renal disease: Secondary | ICD-10-CM | POA: Diagnosis not present

## 2021-01-31 DIAGNOSIS — Z992 Dependence on renal dialysis: Secondary | ICD-10-CM | POA: Diagnosis not present

## 2021-02-03 DIAGNOSIS — Z992 Dependence on renal dialysis: Secondary | ICD-10-CM | POA: Diagnosis not present

## 2021-02-03 DIAGNOSIS — N186 End stage renal disease: Secondary | ICD-10-CM | POA: Diagnosis not present

## 2021-02-05 DIAGNOSIS — N186 End stage renal disease: Secondary | ICD-10-CM | POA: Diagnosis not present

## 2021-02-05 DIAGNOSIS — Z992 Dependence on renal dialysis: Secondary | ICD-10-CM | POA: Diagnosis not present

## 2021-02-07 DIAGNOSIS — N186 End stage renal disease: Secondary | ICD-10-CM | POA: Diagnosis not present

## 2021-02-07 DIAGNOSIS — Z992 Dependence on renal dialysis: Secondary | ICD-10-CM | POA: Diagnosis not present

## 2021-02-10 DIAGNOSIS — Z992 Dependence on renal dialysis: Secondary | ICD-10-CM | POA: Diagnosis not present

## 2021-02-10 DIAGNOSIS — N186 End stage renal disease: Secondary | ICD-10-CM | POA: Diagnosis not present

## 2021-02-12 DIAGNOSIS — Z992 Dependence on renal dialysis: Secondary | ICD-10-CM | POA: Diagnosis not present

## 2021-02-12 DIAGNOSIS — N186 End stage renal disease: Secondary | ICD-10-CM | POA: Diagnosis not present

## 2021-02-14 DIAGNOSIS — N186 End stage renal disease: Secondary | ICD-10-CM | POA: Diagnosis not present

## 2021-02-14 DIAGNOSIS — Z992 Dependence on renal dialysis: Secondary | ICD-10-CM | POA: Diagnosis not present

## 2021-02-17 DIAGNOSIS — Z794 Long term (current) use of insulin: Secondary | ICD-10-CM | POA: Diagnosis not present

## 2021-02-17 DIAGNOSIS — N186 End stage renal disease: Secondary | ICD-10-CM | POA: Diagnosis not present

## 2021-02-17 DIAGNOSIS — Z992 Dependence on renal dialysis: Secondary | ICD-10-CM | POA: Diagnosis not present

## 2021-02-17 DIAGNOSIS — E119 Type 2 diabetes mellitus without complications: Secondary | ICD-10-CM | POA: Diagnosis not present

## 2021-02-19 DIAGNOSIS — N186 End stage renal disease: Secondary | ICD-10-CM | POA: Diagnosis not present

## 2021-02-19 DIAGNOSIS — Z992 Dependence on renal dialysis: Secondary | ICD-10-CM | POA: Diagnosis not present

## 2021-02-20 DIAGNOSIS — N186 End stage renal disease: Secondary | ICD-10-CM | POA: Diagnosis not present

## 2021-02-20 DIAGNOSIS — Z992 Dependence on renal dialysis: Secondary | ICD-10-CM | POA: Diagnosis not present

## 2021-02-21 DIAGNOSIS — N186 End stage renal disease: Secondary | ICD-10-CM | POA: Diagnosis not present

## 2021-02-21 DIAGNOSIS — Z992 Dependence on renal dialysis: Secondary | ICD-10-CM | POA: Diagnosis not present

## 2021-02-24 DIAGNOSIS — N186 End stage renal disease: Secondary | ICD-10-CM | POA: Diagnosis not present

## 2021-02-24 DIAGNOSIS — Z992 Dependence on renal dialysis: Secondary | ICD-10-CM | POA: Diagnosis not present

## 2021-02-26 DIAGNOSIS — Z992 Dependence on renal dialysis: Secondary | ICD-10-CM | POA: Diagnosis not present

## 2021-02-26 DIAGNOSIS — N186 End stage renal disease: Secondary | ICD-10-CM | POA: Diagnosis not present

## 2021-02-28 DIAGNOSIS — N186 End stage renal disease: Secondary | ICD-10-CM | POA: Diagnosis not present

## 2021-02-28 DIAGNOSIS — Z992 Dependence on renal dialysis: Secondary | ICD-10-CM | POA: Diagnosis not present

## 2021-03-03 DIAGNOSIS — N186 End stage renal disease: Secondary | ICD-10-CM | POA: Diagnosis not present

## 2021-03-03 DIAGNOSIS — Z992 Dependence on renal dialysis: Secondary | ICD-10-CM | POA: Diagnosis not present

## 2021-03-05 DIAGNOSIS — N186 End stage renal disease: Secondary | ICD-10-CM | POA: Diagnosis not present

## 2021-03-05 DIAGNOSIS — Z992 Dependence on renal dialysis: Secondary | ICD-10-CM | POA: Diagnosis not present

## 2021-03-07 DIAGNOSIS — Z992 Dependence on renal dialysis: Secondary | ICD-10-CM | POA: Diagnosis not present

## 2021-03-07 DIAGNOSIS — N186 End stage renal disease: Secondary | ICD-10-CM | POA: Diagnosis not present

## 2021-03-10 DIAGNOSIS — Z992 Dependence on renal dialysis: Secondary | ICD-10-CM | POA: Diagnosis not present

## 2021-03-10 DIAGNOSIS — N186 End stage renal disease: Secondary | ICD-10-CM | POA: Diagnosis not present

## 2021-03-11 ENCOUNTER — Other Ambulatory Visit: Payer: Self-pay | Admitting: Adult Health

## 2021-03-12 DIAGNOSIS — Z992 Dependence on renal dialysis: Secondary | ICD-10-CM | POA: Diagnosis not present

## 2021-03-12 DIAGNOSIS — N186 End stage renal disease: Secondary | ICD-10-CM | POA: Diagnosis not present

## 2021-03-14 DIAGNOSIS — Z992 Dependence on renal dialysis: Secondary | ICD-10-CM | POA: Diagnosis not present

## 2021-03-14 DIAGNOSIS — N186 End stage renal disease: Secondary | ICD-10-CM | POA: Diagnosis not present

## 2021-03-17 DIAGNOSIS — N186 End stage renal disease: Secondary | ICD-10-CM | POA: Diagnosis not present

## 2021-03-17 DIAGNOSIS — Z992 Dependence on renal dialysis: Secondary | ICD-10-CM | POA: Diagnosis not present

## 2021-03-19 DIAGNOSIS — N186 End stage renal disease: Secondary | ICD-10-CM | POA: Diagnosis not present

## 2021-03-19 DIAGNOSIS — Z992 Dependence on renal dialysis: Secondary | ICD-10-CM | POA: Diagnosis not present

## 2021-03-21 DIAGNOSIS — N186 End stage renal disease: Secondary | ICD-10-CM | POA: Diagnosis not present

## 2021-03-21 DIAGNOSIS — Z992 Dependence on renal dialysis: Secondary | ICD-10-CM | POA: Diagnosis not present

## 2021-03-22 DIAGNOSIS — N186 End stage renal disease: Secondary | ICD-10-CM | POA: Diagnosis not present

## 2021-03-22 DIAGNOSIS — Z992 Dependence on renal dialysis: Secondary | ICD-10-CM | POA: Diagnosis not present

## 2021-03-24 DIAGNOSIS — Z992 Dependence on renal dialysis: Secondary | ICD-10-CM | POA: Diagnosis not present

## 2021-03-24 DIAGNOSIS — N186 End stage renal disease: Secondary | ICD-10-CM | POA: Diagnosis not present

## 2021-03-26 DIAGNOSIS — Z992 Dependence on renal dialysis: Secondary | ICD-10-CM | POA: Diagnosis not present

## 2021-03-26 DIAGNOSIS — N186 End stage renal disease: Secondary | ICD-10-CM | POA: Diagnosis not present

## 2021-03-26 DIAGNOSIS — M545 Low back pain, unspecified: Secondary | ICD-10-CM | POA: Diagnosis not present

## 2021-03-26 DIAGNOSIS — Z6825 Body mass index (BMI) 25.0-25.9, adult: Secondary | ICD-10-CM | POA: Diagnosis not present

## 2021-03-26 DIAGNOSIS — I700 Atherosclerosis of aorta: Secondary | ICD-10-CM | POA: Diagnosis not present

## 2021-03-28 DIAGNOSIS — N186 End stage renal disease: Secondary | ICD-10-CM | POA: Diagnosis not present

## 2021-03-28 DIAGNOSIS — Z992 Dependence on renal dialysis: Secondary | ICD-10-CM | POA: Diagnosis not present

## 2021-03-31 DIAGNOSIS — M545 Low back pain, unspecified: Secondary | ICD-10-CM | POA: Diagnosis not present

## 2021-03-31 DIAGNOSIS — N186 End stage renal disease: Secondary | ICD-10-CM | POA: Diagnosis not present

## 2021-03-31 DIAGNOSIS — Z992 Dependence on renal dialysis: Secondary | ICD-10-CM | POA: Diagnosis not present

## 2021-04-02 DIAGNOSIS — Z992 Dependence on renal dialysis: Secondary | ICD-10-CM | POA: Diagnosis not present

## 2021-04-02 DIAGNOSIS — N186 End stage renal disease: Secondary | ICD-10-CM | POA: Diagnosis not present

## 2021-04-04 DIAGNOSIS — Z992 Dependence on renal dialysis: Secondary | ICD-10-CM | POA: Diagnosis not present

## 2021-04-04 DIAGNOSIS — N186 End stage renal disease: Secondary | ICD-10-CM | POA: Diagnosis not present

## 2021-04-07 DIAGNOSIS — M25512 Pain in left shoulder: Secondary | ICD-10-CM | POA: Diagnosis not present

## 2021-04-07 DIAGNOSIS — Z992 Dependence on renal dialysis: Secondary | ICD-10-CM | POA: Diagnosis not present

## 2021-04-07 DIAGNOSIS — I120 Hypertensive chronic kidney disease with stage 5 chronic kidney disease or end stage renal disease: Secondary | ICD-10-CM | POA: Diagnosis not present

## 2021-04-07 DIAGNOSIS — N186 End stage renal disease: Secondary | ICD-10-CM | POA: Diagnosis not present

## 2021-04-07 DIAGNOSIS — W1839XA Other fall on same level, initial encounter: Secondary | ICD-10-CM | POA: Diagnosis not present

## 2021-04-07 DIAGNOSIS — Z9889 Other specified postprocedural states: Secondary | ICD-10-CM | POA: Diagnosis not present

## 2021-04-07 DIAGNOSIS — S42212A Unspecified displaced fracture of surgical neck of left humerus, initial encounter for closed fracture: Secondary | ICD-10-CM | POA: Diagnosis not present

## 2021-04-07 DIAGNOSIS — E1122 Type 2 diabetes mellitus with diabetic chronic kidney disease: Secondary | ICD-10-CM | POA: Diagnosis not present

## 2021-04-07 DIAGNOSIS — R410 Disorientation, unspecified: Secondary | ICD-10-CM | POA: Diagnosis not present

## 2021-04-07 DIAGNOSIS — M25522 Pain in left elbow: Secondary | ICD-10-CM | POA: Diagnosis not present

## 2021-04-07 DIAGNOSIS — R29898 Other symptoms and signs involving the musculoskeletal system: Secondary | ICD-10-CM | POA: Diagnosis not present

## 2021-04-07 DIAGNOSIS — M79672 Pain in left foot: Secondary | ICD-10-CM | POA: Diagnosis not present

## 2021-04-09 DIAGNOSIS — Z992 Dependence on renal dialysis: Secondary | ICD-10-CM | POA: Diagnosis not present

## 2021-04-09 DIAGNOSIS — N186 End stage renal disease: Secondary | ICD-10-CM | POA: Diagnosis not present

## 2021-04-11 DIAGNOSIS — N186 End stage renal disease: Secondary | ICD-10-CM | POA: Diagnosis not present

## 2021-04-11 DIAGNOSIS — Z992 Dependence on renal dialysis: Secondary | ICD-10-CM | POA: Diagnosis not present

## 2021-04-14 DIAGNOSIS — Z992 Dependence on renal dialysis: Secondary | ICD-10-CM | POA: Diagnosis not present

## 2021-04-14 DIAGNOSIS — N186 End stage renal disease: Secondary | ICD-10-CM | POA: Diagnosis not present

## 2021-04-16 DIAGNOSIS — N186 End stage renal disease: Secondary | ICD-10-CM | POA: Diagnosis not present

## 2021-04-16 DIAGNOSIS — Z992 Dependence on renal dialysis: Secondary | ICD-10-CM | POA: Diagnosis not present

## 2021-04-18 DIAGNOSIS — N186 End stage renal disease: Secondary | ICD-10-CM | POA: Diagnosis not present

## 2021-04-18 DIAGNOSIS — Z79899 Other long term (current) drug therapy: Secondary | ICD-10-CM | POA: Diagnosis not present

## 2021-04-18 DIAGNOSIS — Z5181 Encounter for therapeutic drug level monitoring: Secondary | ICD-10-CM | POA: Diagnosis not present

## 2021-04-18 DIAGNOSIS — Z992 Dependence on renal dialysis: Secondary | ICD-10-CM | POA: Diagnosis not present

## 2021-04-21 DIAGNOSIS — Z992 Dependence on renal dialysis: Secondary | ICD-10-CM | POA: Diagnosis not present

## 2021-04-21 DIAGNOSIS — N186 End stage renal disease: Secondary | ICD-10-CM | POA: Diagnosis not present

## 2021-04-22 DIAGNOSIS — Z992 Dependence on renal dialysis: Secondary | ICD-10-CM | POA: Diagnosis not present

## 2021-04-22 DIAGNOSIS — N186 End stage renal disease: Secondary | ICD-10-CM | POA: Diagnosis not present

## 2021-04-23 DIAGNOSIS — N186 End stage renal disease: Secondary | ICD-10-CM | POA: Diagnosis not present

## 2021-04-23 DIAGNOSIS — Z992 Dependence on renal dialysis: Secondary | ICD-10-CM | POA: Diagnosis not present

## 2021-04-25 DIAGNOSIS — N186 End stage renal disease: Secondary | ICD-10-CM | POA: Diagnosis not present

## 2021-04-25 DIAGNOSIS — Z992 Dependence on renal dialysis: Secondary | ICD-10-CM | POA: Diagnosis not present

## 2021-04-28 DIAGNOSIS — N186 End stage renal disease: Secondary | ICD-10-CM | POA: Diagnosis not present

## 2021-04-28 DIAGNOSIS — Z992 Dependence on renal dialysis: Secondary | ICD-10-CM | POA: Diagnosis not present

## 2021-04-30 DIAGNOSIS — N186 End stage renal disease: Secondary | ICD-10-CM | POA: Diagnosis not present

## 2021-04-30 DIAGNOSIS — Z992 Dependence on renal dialysis: Secondary | ICD-10-CM | POA: Diagnosis not present

## 2021-05-02 DIAGNOSIS — N186 End stage renal disease: Secondary | ICD-10-CM | POA: Diagnosis not present

## 2021-05-02 DIAGNOSIS — Z992 Dependence on renal dialysis: Secondary | ICD-10-CM | POA: Diagnosis not present

## 2021-05-05 ENCOUNTER — Other Ambulatory Visit: Payer: Self-pay

## 2021-05-05 ENCOUNTER — Encounter: Payer: Self-pay | Admitting: Internal Medicine

## 2021-05-05 ENCOUNTER — Ambulatory Visit (INDEPENDENT_AMBULATORY_CARE_PROVIDER_SITE_OTHER): Payer: Medicare PPO | Admitting: Internal Medicine

## 2021-05-05 VITALS — BP 141/87 | HR 102 | Wt 157.6 lb

## 2021-05-05 DIAGNOSIS — M4640 Discitis, unspecified, site unspecified: Secondary | ICD-10-CM

## 2021-05-05 DIAGNOSIS — Z992 Dependence on renal dialysis: Secondary | ICD-10-CM | POA: Diagnosis not present

## 2021-05-05 DIAGNOSIS — N186 End stage renal disease: Secondary | ICD-10-CM | POA: Diagnosis not present

## 2021-05-05 NOTE — Progress Notes (Signed)
RFV: follow up on osteomyelitis  Patient ID: [name], female   DOB: Jul 15, 1951, 70 y.o.   MRN: [id number]  HPI  Has hx of pseudomonas bacteremia and lumbar discitis in fall 2020. Now has worsening back pain in the last 2 months. She had plain xrays that found to have destruction disc space on L2-L3, thought to be related to remote infection. (Mri imaging in fall 2020 also noted) Patient came back to clinic to determine if needs treatment for osteomyelitis  Has significant back pain -which she takes muscle relaxant and pain.  Outpatient Encounter Medications as of 05/05/2021  Medication Sig   acetaminophen (TYLENOL) 325 MG tablet Take 2 tablets (650 mg total) by mouth every 12 (twelve) hours as needed for mild pain (or temp > 37.5 C (99.5 F)).   apixaban (ELIQUIS) 5 MG TABS tablet Take 1 tablet (5 mg total) by mouth 2 (two) times daily.   carvedilol (COREG) 12.5 MG tablet Take 1 tablet (12.5 mg total) by mouth 2 (two) times daily.   Darbepoetin Alfa (ARANESP) 100 MCG/0.5ML SOSY injection Inject 0.5 mLs (100 mcg total) into the vein every Monday with hemodialysis.   divalproex (DEPAKOTE) 250 MG DR tablet Take 1 tablet (250 mg total) by mouth 2 (two) times daily.   doxercalciferol (HECTOROL) 4 MCG/2ML injection Inject 0.75 mLs (1.5 mcg total) into the vein every Monday, Wednesday, and Friday with hemodialysis.   ferric gluconate 125 mg in sodium chloride 0.9 % 100 mL Inject 125 mg into the vein every Monday.   gabapentin (NEURONTIN) 300 MG capsule Take 1 capsule (300 mg total) by mouth daily as needed (nerve pain).   insulin degludec (TRESIBA FLEXTOUCH) 100 UNIT/ML FlexTouch Pen INJECT TEN UNITS SUBCUTANEOUSLY EVERY NIGHT AT BEDTIME   isosorbide mononitrate (IMDUR) 60 MG 24 hr tablet Take 1 tablet (60 mg total) by mouth daily.   multivitamin (RENA-VIT) TABS tablet Take 1 tablet by mouth daily after lunch.   pantoprazole (PROTONIX) 40 MG tablet Take 1 tablet (40 mg total) by mouth  daily.   diclofenac Sodium (VOLTAREN) 1 % GEL Apply 2 g topically 4 (four) times daily.   No facility-administered encounter medications on file as of 05/05/2021.     Patient Active Problem List   Diagnosis Date Noted   History of SBE (subacute bacterial endocarditis) 03/21/2020   History of embolic stroke 123456   ESRD (end stage renal disease) on dialysis ([place]) 03/21/2020   Mitral valve regurgitation due to infection 01/24/2020   Acute deep vein thrombosis (DVT) of non-extremity vein    Greater trochanteric bursitis of left hip    Subtherapeutic international normalized ratio (INR)    Pneumatosis coli    Ogilvie's syndrome    Decubitus ulcer of sacral region, unstageable ([place])    Hemodialysis-associated hypotension    Supratherapeutic INR    Gastroesophageal reflux disease    Encephalopathy, hepatic (HCC)    Hyperammonemia (HCC)    Sleep disturbance    Candidiasis    Endocarditis of mitral valve    Septic embolism (HCC)    Seizure (HCC)    Slow transit constipation    Acute on chronic anemia    Labile blood pressure    Labile blood glucose    Diabetes mellitus type 2 in nonobese ([place])    Bacteremia    Cerebrovascular accident (CVA) of right basal ganglia ([place]) 11/01/2019   Acute bacterial endocarditis    Acute left-sided weakness    Acute ischemic stroke ([place]) 10/20/2019   Hyperkalemia  09/10/2019   End-stage renal disease on hemodialysis (HCC)    Nausea vomiting and diarrhea    Bacteremia due to Pseudomonas 07/20/2019   ESRD needing dialysis ([place]) 05/15/2019   Anemia of chronic renal failure 04/03/2018   Essential hypertension 04/03/2018   Type 2 diabetes mellitus with diabetic nephropathy ([place]) 04/03/2018     Health Maintenance Due  Topic Date Due   COVID-19 Vaccine (1) Never done   FOOT EXAM  Never done   OPHTHALMOLOGY EXAM  Never done   URINE MICROALBUMIN  Never done   Hepatitis C Screening  Never done   TETANUS/TDAP  Never done   Zoster Vaccines-  Shingrix (1 of 2) Never done   COLONOSCOPY (Pts 45-13yr Insurance coverage will need to be confirmed)  Never done   MAMMOGRAM  Never done   DEXA SCAN  Never done   PNA vac Low Risk Adult (2 of 2 - PCV13) 07/20/2020   HEMOGLOBIN A1C  10/23/2020     Review of Systems Review of Systems  Constitutional: Negative for fever, chills, diaphoresis, activity change, appetite change, fatigue and unexpected weight change.  HENT: Negative for congestion, sore throat, rhinorrhea, sneezing, trouble swallowing and sinus pressure.  Eyes: Negative for photophobia and visual disturbance.  Respiratory: Negative for cough, chest tightness, shortness of breath, wheezing and stridor.  Cardiovascular: Negative for chest pain, palpitations and leg swelling.  Gastrointestinal: Negative for nausea, vomiting, abdominal pain, diarrhea, constipation, blood in stool, abdominal distention and anal bleeding.  Genitourinary: Negative for dysuria, hematuria, flank pain and difficulty urinating.  Musculoskeletal: +back pain Skin: Negative for color change, pallor, rash and wound.  Neurological: Negative for dizziness, tremors, weakness and light-headedness.  Hematological: Negative for adenopathy. Does not bruise/bleed easily.  Psychiatric/Behavioral: Negative for behavioral problems, confusion, sleep disturbance, dysphoric mood, decreased concentration and agitation.   Physical Exam   BP (!) 141/87   Pulse (!) 102   Wt 157 lb 10.1 oz (71.5 kg)   BMI 24.69 kg/m    Physical Exam  Constitutional:  oriented to person, place, and time. appears well-developed and well-nourished. No distress.  HENT: [place]/AT, PERRLA, no scleral icterus Mouth/Throat: Oropharynx is clear and moist. No oropharyngeal exudate.  Cardiovascular: Normal rate, regular rhythm and normal heart sounds. Exam reveals no gallop and no friction rub.  No murmur heard.  Pulmonary/Chest: [place] normal and breath sounds normal. No respiratory distress.  has  no wheezes.  Neck = supple, no nuchal rigidity Abdominal: Soft. Bowel sounds are normal.  exhibits no distension. There is no tenderness.  Lymphadenopathy: no cervical adenopathy. No axillary adenopathy Neurological: alert and oriented to person, place, and time.  Skin: Skin is warm and dry. No rash noted. No erythema.  Psychiatric: a normal mood and affect.  behavior is normal.    CBC Lab Results  Component Value Date   WBC 5.6 05/31/2020   RBC 4.25 05/31/2020   HGB 12.0 05/31/2020   HCT 38.7 05/31/2020   PLT 162 05/31/2020   MCV 91.1 05/31/2020   MCH 28.2 05/31/2020   MCHC 31.0 05/31/2020   RDW 17.0 (H) 05/31/2020   LYMPHSABS 1.2 05/31/2020   MONOABS 0.4 05/31/2020   EOSABS 0.1 05/31/2020    BMET Lab Results  Component Value Date   NA 140 05/31/2020   K 5.9 (H) 05/31/2020   CL 99 05/31/2020   CO2 27 05/31/2020   GLUCOSE 102 (H) 05/31/2020   BUN 26 (H) 05/31/2020   CREATININE 4.63 (H) 05/31/2020   CALCIUM 8.6 (L)  05/31/2020   GFRNONAA 9 (L) 05/31/2020   GFRAA 10 (L) 05/31/2020    Lab Results  Component Value Date   CRP 2.9 05/05/2021      Assessment and Plan  History of discitis but now worsening back pain = unclear if ongoing infection vs. Sequelae of infection. Will get inflammatory markers, and Repeat mri to see findings. Also would benefit from referral to neurosurgery for input on imaging and clinical picture to improve her state of health

## 2021-05-06 LAB — CBC WITH DIFFERENTIAL/PLATELET
Absolute Monocytes: 380 cells/uL (ref 200–950)
Basophils Absolute: 37 cells/uL (ref 0–200)
Basophils Relative: 0.5 %
Eosinophils Absolute: 88 cells/uL (ref 15–500)
Eosinophils Relative: 1.2 %
HCT: 37.7 % (ref 35.0–45.0)
Hemoglobin: 12 g/dL (ref 11.7–15.5)
Lymphs Abs: 1365 cells/uL (ref 850–3900)
MCH: 29.2 pg (ref 27.0–33.0)
MCHC: 31.8 g/dL — ABNORMAL LOW (ref 32.0–36.0)
MCV: 91.7 fL (ref 80.0–100.0)
MPV: 11.1 fL (ref 7.5–12.5)
Monocytes Relative: 5.2 %
Neutro Abs: 5431 cells/uL (ref 1500–7800)
Neutrophils Relative %: 74.4 %
Platelets: 171 10*3/uL (ref 140–400)
RBC: 4.11 10*6/uL (ref 3.80–5.10)
RDW: 13.1 % (ref 11.0–15.0)
Total Lymphocyte: 18.7 %
WBC: 7.3 10*3/uL (ref 3.8–10.8)

## 2021-05-06 LAB — BASIC METABOLIC PANEL
BUN/Creatinine Ratio: 3 (calc) — ABNORMAL LOW (ref 6–22)
BUN: 16 mg/dL (ref 7–25)
CO2: 28 mmol/L (ref 20–32)
Calcium: 9.1 mg/dL (ref 8.6–10.4)
Chloride: 95 mmol/L — ABNORMAL LOW (ref 98–110)
Creat: 5.3 mg/dL — ABNORMAL HIGH (ref 0.60–0.93)
Glucose, Bld: 181 mg/dL — ABNORMAL HIGH (ref 65–99)
Potassium: 4.3 mmol/L (ref 3.5–5.3)
Sodium: 138 mmol/L (ref 135–146)

## 2021-05-06 LAB — C-REACTIVE PROTEIN: CRP: 2.9 mg/L (ref <8.0)

## 2021-05-07 DIAGNOSIS — N186 End stage renal disease: Secondary | ICD-10-CM | POA: Diagnosis not present

## 2021-05-07 DIAGNOSIS — Z992 Dependence on renal dialysis: Secondary | ICD-10-CM | POA: Diagnosis not present

## 2021-05-09 DIAGNOSIS — Z992 Dependence on renal dialysis: Secondary | ICD-10-CM | POA: Diagnosis not present

## 2021-05-09 DIAGNOSIS — N186 End stage renal disease: Secondary | ICD-10-CM | POA: Diagnosis not present

## 2021-05-12 DIAGNOSIS — N186 End stage renal disease: Secondary | ICD-10-CM | POA: Diagnosis not present

## 2021-05-12 DIAGNOSIS — Z992 Dependence on renal dialysis: Secondary | ICD-10-CM | POA: Diagnosis not present

## 2021-05-13 DIAGNOSIS — E1122 Type 2 diabetes mellitus with diabetic chronic kidney disease: Secondary | ICD-10-CM | POA: Diagnosis not present

## 2021-05-13 DIAGNOSIS — D638 Anemia in other chronic diseases classified elsewhere: Secondary | ICD-10-CM | POA: Diagnosis not present

## 2021-05-13 DIAGNOSIS — R569 Unspecified convulsions: Secondary | ICD-10-CM | POA: Diagnosis not present

## 2021-05-13 DIAGNOSIS — K219 Gastro-esophageal reflux disease without esophagitis: Secondary | ICD-10-CM | POA: Diagnosis not present

## 2021-05-13 DIAGNOSIS — E7801 Familial hypercholesterolemia: Secondary | ICD-10-CM | POA: Diagnosis not present

## 2021-05-13 DIAGNOSIS — I6930 Unspecified sequelae of cerebral infarction: Secondary | ICD-10-CM | POA: Diagnosis not present

## 2021-05-13 DIAGNOSIS — I10 Essential (primary) hypertension: Secondary | ICD-10-CM | POA: Diagnosis not present

## 2021-05-13 DIAGNOSIS — I700 Atherosclerosis of aorta: Secondary | ICD-10-CM | POA: Diagnosis not present

## 2021-05-13 DIAGNOSIS — E7800 Pure hypercholesterolemia, unspecified: Secondary | ICD-10-CM | POA: Diagnosis not present

## 2021-05-13 DIAGNOSIS — E1165 Type 2 diabetes mellitus with hyperglycemia: Secondary | ICD-10-CM | POA: Diagnosis not present

## 2021-05-13 DIAGNOSIS — Z0001 Encounter for general adult medical examination with abnormal findings: Secondary | ICD-10-CM | POA: Diagnosis not present

## 2021-05-13 DIAGNOSIS — K2100 Gastro-esophageal reflux disease with esophagitis, without bleeding: Secondary | ICD-10-CM | POA: Diagnosis not present

## 2021-05-14 DIAGNOSIS — Z992 Dependence on renal dialysis: Secondary | ICD-10-CM | POA: Diagnosis not present

## 2021-05-14 DIAGNOSIS — N186 End stage renal disease: Secondary | ICD-10-CM | POA: Diagnosis not present

## 2021-05-15 ENCOUNTER — Other Ambulatory Visit: Payer: Self-pay

## 2021-05-15 ENCOUNTER — Ambulatory Visit (HOSPITAL_COMMUNITY)
Admission: RE | Admit: 2021-05-15 | Discharge: 2021-05-15 | Disposition: A | Discharge status: Home / Self Care | Payer: Medicare PPO | Source: Ambulatory Visit | Attending: Internal Medicine | Admitting: Internal Medicine

## 2021-05-15 DIAGNOSIS — M4640 Discitis, unspecified, site unspecified: Secondary | ICD-10-CM | POA: Insufficient documentation

## 2021-05-15 DIAGNOSIS — M545 Low back pain, unspecified: Secondary | ICD-10-CM | POA: Diagnosis not present

## 2021-05-16 DIAGNOSIS — E1122 Type 2 diabetes mellitus with diabetic chronic kidney disease: Secondary | ICD-10-CM | POA: Diagnosis not present

## 2021-05-16 DIAGNOSIS — Z992 Dependence on renal dialysis: Secondary | ICD-10-CM | POA: Diagnosis not present

## 2021-05-16 DIAGNOSIS — I700 Atherosclerosis of aorta: Secondary | ICD-10-CM | POA: Diagnosis not present

## 2021-05-16 DIAGNOSIS — D638 Anemia in other chronic diseases classified elsewhere: Secondary | ICD-10-CM | POA: Diagnosis not present

## 2021-05-16 DIAGNOSIS — M545 Low back pain, unspecified: Secondary | ICD-10-CM | POA: Diagnosis not present

## 2021-05-16 DIAGNOSIS — I10 Essential (primary) hypertension: Secondary | ICD-10-CM | POA: Diagnosis not present

## 2021-05-16 DIAGNOSIS — N186 End stage renal disease: Secondary | ICD-10-CM | POA: Diagnosis not present

## 2021-05-16 DIAGNOSIS — Z6825 Body mass index (BMI) 25.0-25.9, adult: Secondary | ICD-10-CM | POA: Diagnosis not present

## 2021-05-19 DIAGNOSIS — E119 Type 2 diabetes mellitus without complications: Secondary | ICD-10-CM | POA: Diagnosis not present

## 2021-05-19 DIAGNOSIS — Z992 Dependence on renal dialysis: Secondary | ICD-10-CM | POA: Diagnosis not present

## 2021-05-19 DIAGNOSIS — N186 End stage renal disease: Secondary | ICD-10-CM | POA: Diagnosis not present

## 2021-05-19 DIAGNOSIS — Z794 Long term (current) use of insulin: Secondary | ICD-10-CM | POA: Diagnosis not present

## 2021-05-21 DIAGNOSIS — N186 End stage renal disease: Secondary | ICD-10-CM | POA: Diagnosis not present

## 2021-05-21 DIAGNOSIS — Z992 Dependence on renal dialysis: Secondary | ICD-10-CM | POA: Diagnosis not present

## 2021-05-22 DIAGNOSIS — E1122 Type 2 diabetes mellitus with diabetic chronic kidney disease: Secondary | ICD-10-CM | POA: Diagnosis not present

## 2021-05-22 DIAGNOSIS — Z992 Dependence on renal dialysis: Secondary | ICD-10-CM | POA: Diagnosis not present

## 2021-05-22 DIAGNOSIS — I129 Hypertensive chronic kidney disease with stage 1 through stage 4 chronic kidney disease, or unspecified chronic kidney disease: Secondary | ICD-10-CM | POA: Diagnosis not present

## 2021-05-22 DIAGNOSIS — N184 Chronic kidney disease, stage 4 (severe): Secondary | ICD-10-CM | POA: Diagnosis not present

## 2021-05-22 DIAGNOSIS — N186 End stage renal disease: Secondary | ICD-10-CM | POA: Diagnosis not present

## 2021-05-23 DIAGNOSIS — N186 End stage renal disease: Secondary | ICD-10-CM | POA: Diagnosis not present

## 2021-05-23 DIAGNOSIS — Z992 Dependence on renal dialysis: Secondary | ICD-10-CM | POA: Diagnosis not present

## 2021-05-26 DIAGNOSIS — N186 End stage renal disease: Secondary | ICD-10-CM | POA: Diagnosis not present

## 2021-05-26 DIAGNOSIS — Z992 Dependence on renal dialysis: Secondary | ICD-10-CM | POA: Diagnosis not present

## 2021-05-28 DIAGNOSIS — N186 End stage renal disease: Secondary | ICD-10-CM | POA: Diagnosis not present

## 2021-05-28 DIAGNOSIS — Z992 Dependence on renal dialysis: Secondary | ICD-10-CM | POA: Diagnosis not present

## 2021-05-30 DIAGNOSIS — N186 End stage renal disease: Secondary | ICD-10-CM | POA: Diagnosis not present

## 2021-05-30 DIAGNOSIS — Z992 Dependence on renal dialysis: Secondary | ICD-10-CM | POA: Diagnosis not present

## 2021-06-02 DIAGNOSIS — N186 End stage renal disease: Secondary | ICD-10-CM | POA: Diagnosis not present

## 2021-06-02 DIAGNOSIS — Z992 Dependence on renal dialysis: Secondary | ICD-10-CM | POA: Diagnosis not present

## 2021-06-04 DIAGNOSIS — Z992 Dependence on renal dialysis: Secondary | ICD-10-CM | POA: Diagnosis not present

## 2021-06-04 DIAGNOSIS — N186 End stage renal disease: Secondary | ICD-10-CM | POA: Diagnosis not present

## 2021-06-06 DIAGNOSIS — N186 End stage renal disease: Secondary | ICD-10-CM | POA: Diagnosis not present

## 2021-06-06 DIAGNOSIS — Z992 Dependence on renal dialysis: Secondary | ICD-10-CM | POA: Diagnosis not present

## 2021-06-09 DIAGNOSIS — N186 End stage renal disease: Secondary | ICD-10-CM | POA: Diagnosis not present

## 2021-06-09 DIAGNOSIS — Z992 Dependence on renal dialysis: Secondary | ICD-10-CM | POA: Diagnosis not present

## 2021-06-11 ENCOUNTER — Other Ambulatory Visit: Payer: Self-pay | Admitting: Adult Health

## 2021-06-11 DIAGNOSIS — N186 End stage renal disease: Secondary | ICD-10-CM | POA: Diagnosis not present

## 2021-06-11 DIAGNOSIS — Z992 Dependence on renal dialysis: Secondary | ICD-10-CM | POA: Diagnosis not present

## 2021-06-13 DIAGNOSIS — Z992 Dependence on renal dialysis: Secondary | ICD-10-CM | POA: Diagnosis not present

## 2021-06-13 DIAGNOSIS — N186 End stage renal disease: Secondary | ICD-10-CM | POA: Diagnosis not present

## 2021-06-16 DIAGNOSIS — N186 End stage renal disease: Secondary | ICD-10-CM | POA: Diagnosis not present

## 2021-06-16 DIAGNOSIS — Z992 Dependence on renal dialysis: Secondary | ICD-10-CM | POA: Diagnosis not present

## 2021-06-18 DIAGNOSIS — N186 End stage renal disease: Secondary | ICD-10-CM | POA: Diagnosis not present

## 2021-06-18 DIAGNOSIS — Z992 Dependence on renal dialysis: Secondary | ICD-10-CM | POA: Diagnosis not present

## 2021-06-20 DIAGNOSIS — N186 End stage renal disease: Secondary | ICD-10-CM | POA: Diagnosis not present

## 2021-06-20 DIAGNOSIS — Z992 Dependence on renal dialysis: Secondary | ICD-10-CM | POA: Diagnosis not present

## 2021-06-22 DIAGNOSIS — N184 Chronic kidney disease, stage 4 (severe): Secondary | ICD-10-CM | POA: Diagnosis not present

## 2021-06-22 DIAGNOSIS — I129 Hypertensive chronic kidney disease with stage 1 through stage 4 chronic kidney disease, or unspecified chronic kidney disease: Secondary | ICD-10-CM | POA: Diagnosis not present

## 2021-06-22 DIAGNOSIS — Z992 Dependence on renal dialysis: Secondary | ICD-10-CM | POA: Diagnosis not present

## 2021-06-22 DIAGNOSIS — E1122 Type 2 diabetes mellitus with diabetic chronic kidney disease: Secondary | ICD-10-CM | POA: Diagnosis not present

## 2021-06-22 DIAGNOSIS — N186 End stage renal disease: Secondary | ICD-10-CM | POA: Diagnosis not present

## 2021-06-23 DIAGNOSIS — N186 End stage renal disease: Secondary | ICD-10-CM | POA: Diagnosis not present

## 2021-06-23 DIAGNOSIS — Z992 Dependence on renal dialysis: Secondary | ICD-10-CM | POA: Diagnosis not present

## 2021-06-25 DIAGNOSIS — N186 End stage renal disease: Secondary | ICD-10-CM | POA: Diagnosis not present

## 2021-06-25 DIAGNOSIS — Z992 Dependence on renal dialysis: Secondary | ICD-10-CM | POA: Diagnosis not present

## 2021-06-27 DIAGNOSIS — Z992 Dependence on renal dialysis: Secondary | ICD-10-CM | POA: Diagnosis not present

## 2021-06-27 DIAGNOSIS — N186 End stage renal disease: Secondary | ICD-10-CM | POA: Diagnosis not present

## 2021-06-30 DIAGNOSIS — Z992 Dependence on renal dialysis: Secondary | ICD-10-CM | POA: Diagnosis not present

## 2021-06-30 DIAGNOSIS — N186 End stage renal disease: Secondary | ICD-10-CM | POA: Diagnosis not present

## 2021-07-02 DIAGNOSIS — Z992 Dependence on renal dialysis: Secondary | ICD-10-CM | POA: Diagnosis not present

## 2021-07-02 DIAGNOSIS — N186 End stage renal disease: Secondary | ICD-10-CM | POA: Diagnosis not present

## 2021-07-04 DIAGNOSIS — Z992 Dependence on renal dialysis: Secondary | ICD-10-CM | POA: Diagnosis not present

## 2021-07-04 DIAGNOSIS — N186 End stage renal disease: Secondary | ICD-10-CM | POA: Diagnosis not present

## 2021-07-07 DIAGNOSIS — Z992 Dependence on renal dialysis: Secondary | ICD-10-CM | POA: Diagnosis not present

## 2021-07-07 DIAGNOSIS — N186 End stage renal disease: Secondary | ICD-10-CM | POA: Diagnosis not present

## 2021-07-09 DIAGNOSIS — N186 End stage renal disease: Secondary | ICD-10-CM | POA: Diagnosis not present

## 2021-07-09 DIAGNOSIS — Z992 Dependence on renal dialysis: Secondary | ICD-10-CM | POA: Diagnosis not present

## 2021-07-11 ENCOUNTER — Other Ambulatory Visit: Payer: Self-pay | Admitting: Adult Health

## 2021-07-11 DIAGNOSIS — Z992 Dependence on renal dialysis: Secondary | ICD-10-CM | POA: Diagnosis not present

## 2021-07-11 DIAGNOSIS — N186 End stage renal disease: Secondary | ICD-10-CM | POA: Diagnosis not present

## 2021-07-14 DIAGNOSIS — N186 End stage renal disease: Secondary | ICD-10-CM | POA: Diagnosis not present

## 2021-07-14 DIAGNOSIS — Z992 Dependence on renal dialysis: Secondary | ICD-10-CM | POA: Diagnosis not present

## 2021-07-16 DIAGNOSIS — Z992 Dependence on renal dialysis: Secondary | ICD-10-CM | POA: Diagnosis not present

## 2021-07-16 DIAGNOSIS — N186 End stage renal disease: Secondary | ICD-10-CM | POA: Diagnosis not present

## 2021-07-18 DIAGNOSIS — N186 End stage renal disease: Secondary | ICD-10-CM | POA: Diagnosis not present

## 2021-07-18 DIAGNOSIS — Z992 Dependence on renal dialysis: Secondary | ICD-10-CM | POA: Diagnosis not present

## 2021-07-21 DIAGNOSIS — N186 End stage renal disease: Secondary | ICD-10-CM | POA: Diagnosis not present

## 2021-07-21 DIAGNOSIS — Z992 Dependence on renal dialysis: Secondary | ICD-10-CM | POA: Diagnosis not present

## 2021-07-23 DIAGNOSIS — Z992 Dependence on renal dialysis: Secondary | ICD-10-CM | POA: Diagnosis not present

## 2021-07-23 DIAGNOSIS — N186 End stage renal disease: Secondary | ICD-10-CM | POA: Diagnosis not present

## 2021-07-25 DIAGNOSIS — Z992 Dependence on renal dialysis: Secondary | ICD-10-CM | POA: Diagnosis not present

## 2021-07-25 DIAGNOSIS — N186 End stage renal disease: Secondary | ICD-10-CM | POA: Diagnosis not present

## 2021-07-28 DIAGNOSIS — Z992 Dependence on renal dialysis: Secondary | ICD-10-CM | POA: Diagnosis not present

## 2021-07-28 DIAGNOSIS — N186 End stage renal disease: Secondary | ICD-10-CM | POA: Diagnosis not present

## 2021-07-30 DIAGNOSIS — N186 End stage renal disease: Secondary | ICD-10-CM | POA: Diagnosis not present

## 2021-07-30 DIAGNOSIS — Z992 Dependence on renal dialysis: Secondary | ICD-10-CM | POA: Diagnosis not present

## 2021-08-01 DIAGNOSIS — Z992 Dependence on renal dialysis: Secondary | ICD-10-CM | POA: Diagnosis not present

## 2021-08-01 DIAGNOSIS — N186 End stage renal disease: Secondary | ICD-10-CM | POA: Diagnosis not present

## 2021-08-04 ENCOUNTER — Ambulatory Visit (INDEPENDENT_AMBULATORY_CARE_PROVIDER_SITE_OTHER): Payer: Medicare PPO | Admitting: Cardiology

## 2021-08-04 ENCOUNTER — Encounter (HOSPITAL_BASED_OUTPATIENT_CLINIC_OR_DEPARTMENT_OTHER): Payer: Self-pay | Admitting: Cardiology

## 2021-08-04 ENCOUNTER — Other Ambulatory Visit: Payer: Self-pay

## 2021-08-04 VITALS — BP 92/62 | HR 87 | Ht 67.0 in | Wt 158.4 lb

## 2021-08-04 DIAGNOSIS — I340 Nonrheumatic mitral (valve) insufficiency: Secondary | ICD-10-CM | POA: Diagnosis not present

## 2021-08-04 DIAGNOSIS — Z992 Dependence on renal dialysis: Secondary | ICD-10-CM

## 2021-08-04 DIAGNOSIS — E119 Type 2 diabetes mellitus without complications: Secondary | ICD-10-CM | POA: Diagnosis not present

## 2021-08-04 DIAGNOSIS — Z86718 Personal history of other venous thrombosis and embolism: Secondary | ICD-10-CM

## 2021-08-04 DIAGNOSIS — R0989 Other specified symptoms and signs involving the circulatory and respiratory systems: Secondary | ICD-10-CM | POA: Diagnosis not present

## 2021-08-04 DIAGNOSIS — N186 End stage renal disease: Secondary | ICD-10-CM | POA: Diagnosis not present

## 2021-08-04 DIAGNOSIS — Z8673 Personal history of transient ischemic attack (TIA), and cerebral infarction without residual deficits: Secondary | ICD-10-CM | POA: Diagnosis not present

## 2021-08-04 DIAGNOSIS — Z8679 Personal history of other diseases of the circulatory system: Secondary | ICD-10-CM

## 2021-08-04 NOTE — Progress Notes (Signed)
Cardiology Office Note:    Date:  08/04/2021   ID:  [name], DOB 02/08/1951, MRN [id number]  PCP:  Manon Hilding, MD  Cardiologist:  Buford Dresser, MD  Referring MD: Manon Hilding, MD   CC: follow up  History of Present Illness:    [name] is a 70 y.o. 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 I initially met her 02/01/20 as a new consult at the request of Sasser, Silvestre Moment, MD for the evaluation and management of mitral valve regurgitation.  Note from Dr. Prescott Gum from 01/24/20 reviewed. Also reviewed prolonged hospitalization records from 10/2019-12/05/19 for recurrent pseudomonas bacteremia (initiallly 06/2019), mitral valve endocarditis, embolic stroke. She received 6 week course of IV antibiotics. Also noted to have left IJ DVT, recommended for three months of anticoagulation with apixaban (initially on coumadin due to ESRD, but labile INRs were a challenge, transitioned to DOAC). She is chronically nonambulatory and uses a wheelchair. Felt to be a very poor surgical candidate, recommended for medical management of her MR.  Today: She is accompanied by her sister. Overall, she has been feeling well, and has no new complaints.  Every once in a while she may be short of breath, but generally this has improved.  Of note, she had dialysis today and was given medication to keep her blood pressure up. Hypotension is a chronic problem with dialysis. Sometimes she feels like she needs oxygen during dialysis, but she has not asked for this lately. After leaving dialysis, she feels very fatigued.  Recently she noticed herself stuttering more frequently when trying to speak. She will follow-up with her neurologist.  She endorses bruising easily. She denies any palpitations, or chest pain. No lightheadedness, headaches, syncope, orthopnea, or PND. Also  has no lower extremity edema or exertional symptoms.  She denies any other history of DVT/clot. She currently has a tunneled R HD catheter in her chest. She has not been told how long she needs to remain on anticoagulation. Per the available notes, this was planned to continue for 3 mos post her DVT noted 10/21/2019. However, DVT was still present in R IJ on 11/22/19 despite anticoagulation with coumadin. She was switched to apixaban in 11/2019 due to labile INRS, with plans for 3 mos of anticoagulation.  When I last saw her in 07/2020, she reported that she was still on anticoagulation for her DVT, with a temporary hold for recent eye surgery. One year later, she reports that no one has told her if or when she can stop anticoagulation.   Past Medical History:  Diagnosis Date   Arthritis    hands   Constipation    Diabetes mellitus without complication (HCC)    Type II   ESRD (end stage renal disease) ([place])     M/W/F- Hemodialysis   GERD (gastroesophageal reflux disease)    Headache    Hyperlipidemia    Hypertension    Irregular heart rate    Stroke ([place])    TIA - approx 2010- no residual    Past Surgical History:  Procedure Laterality Date   ABDOMINAL HYSTERECTOMY     AORTIC ARCH ANGIOGRAPHY N/A 03/28/2019   Procedure: AORTIC ARCH ANGIOGRAPHY;  Surgeon: Waynetta Sandy, MD;  Location: [facility];  Service: Cardiovascular;  Laterality: N/A;   AV FISTULA PLACEMENT Left 06/13/2018   Procedure: ARTERIOVENOUS (AV) FISTULA CREATION;  Surgeon: Rosetta Posner, MD;  Location: [facility];  Service: Vascular;  Laterality: Left;   AV FISTULA PLACEMENT Left 08/18/2018   Procedure: INSERTION OF 4-7MM X 45CM ARTERIOVENOUS (AV) GORE-TEX GRAFT LEFT  FOREARM;  Surgeon: Rosetta Posner, MD;  Location: [facility];  Service: Vascular;  Laterality: Left;   AV FISTULA PLACEMENT Right 04/06/2019   Procedure: INSERTION OF ARTERIOVENOUS (AV) GORE-TEX GRAFT RIGHT UPPER ARM;  Surgeon: Waynetta Sandy,  MD;  Location: [place];  Service: Vascular;  Laterality: Right;   AV FISTULA PLACEMENT Right 04/13/2019   Procedure: INSERTION OF ARTERIOVENOUS (AV) BOVINE  ARTEGRAFT GRAFT RIGHT UPPER EXTREMITY;  Surgeon: Serafina Mitchell, MD;  Location: [place];  Service: Vascular;  Laterality: Right;   [place] REMOVAL Right 05/16/2019   Procedure: REMOVAL OF ARTERIOVENOUS GORETEX GRAFT ([place]) RIGHT ARM;  Surgeon: Angelia Mould, MD;  Location: [place];  Service: Vascular;  Laterality: Right;   [place] Right 03/07/2019   Procedure: First Stage Bascilic Vein Transposition Right Arm;  Surgeon: Serafina Mitchell, MD;  Location: [facility];  Service: Vascular;  Laterality: Right;   CATARACT EXTRACTION Right 2005   INSERTION OF DIALYSIS CATHETER N/A 08/18/2018   Procedure: INSERTION OF DIALYSIS CATHETER;  Surgeon: Rosetta Posner, MD;  [facility];  Service: Vascular;  Laterality: N/A;   INSERTION OF DIALYSIS CATHETER Right 07/25/2019   Procedure: INSERTION OF DIALYSIS CATHETER RIGHT INTERNAL JUGULAR (TUNNELED);  Surgeon: Virl Cagey, MD;  Location: [facility];  Service: General;  Laterality: Right;   IR FLUORO GUIDE CV LINE LEFT  10/26/2019   IR FLUORO GUIDE CV LINE RIGHT  12/06/2018   IR PTA ADDL CENTRAL DIALYSIS SEG THRU DIALY CIRCUIT RIGHT Right 12/06/2018   IR REMOVAL TUN CV CATH W/O FL  10/23/2019   IR US GUIDE VASC ACCESS LEFT  10/26/2019   TEE WITHOUT CARDIOVERSION N/A 10/26/2019   Procedure: TRANSESOPHAGEAL ECHOCARDIOGRAM (TEE);  Surgeon: Lelon Perla, MD;  [facility];  Service: Cardiovascular;  Laterality: N/A;   THROMBECTOMY AND REVISION OF ARTERIOVENTOUS (AV) GORETEX  GRAFT Left 09/29/2018   Procedure: INSERTION OF ARTERIOVENTOUS (AV) GORETEX  GRAFT ARM;  Surgeon: Waynetta Sandy, MD;  Location: [place];  Service: Vascular;  Laterality: Left;   UPPER EXTREMITY ANGIOGRAPHY Right 03/28/2019   Procedure: UPPER EXTREMITY ANGIOGRAPHY;  Surgeon: Waynetta Sandy, MD;   Location: [place] [facility];  Service: Cardiovascular;  Laterality: Right;   VENOGRAM  10/27/2018   Procedure: Venogram;  Surgeon: Marty Heck, MD;  Location: [facility];  Service: Cardiovascular;;  bilateral arm    Current Medications: Current Outpatient Medications on File Prior to Visit  Medication Sig   acetaminophen (TYLENOL) 325 MG tablet Take 2 tablets (650 mg total) by mouth every 12 (twelve) hours as needed for mild pain (or temp > 37.5 C (99.5 F)).   apixaban (ELIQUIS) 5 MG TABS tablet Take 1 tablet (5 mg total) by mouth 2 (two) times daily.   carvedilol (COREG) 12.5 MG tablet Take 1 tablet (12.5 mg total) by mouth 2 (two) times daily.   Darbepoetin Alfa (ARANESP) 100 MCG/0.5ML SOSY injection Inject 0.5 mLs (100 mcg total) into the vein every Monday with hemodialysis.   diclofenac Sodium (VOLTAREN) 1 % GEL Apply 2 g topically 4 (four) times daily.   divalproex (DEPAKOTE) 250 MG DR tablet TAKE ONE TABLET BY MOUTH TWICE DAILY (MORNING,EVENING)   doxercalciferol (HECTOROL) 4 MCG/2ML injection Inject 0.75 mLs (1.5 mcg total)  into the vein every Monday, Wednesday, and Friday with hemodialysis.   ferric gluconate 125 mg in sodium chloride 0.9 % 100 mL Inject 125 mg into the vein every Monday.   gabapentin (NEURONTIN) 300 MG capsule Take 1 capsule (300 mg total) by mouth daily as needed (nerve pain).   insulin degludec (TRESIBA FLEXTOUCH) 100 UNIT/ML FlexTouch Pen INJECT TEN UNITS SUBCUTANEOUSLY EVERY NIGHT AT BEDTIME   isosorbide mononitrate (IMDUR) 60 MG 24 hr tablet Take 1 tablet (60 mg total) by mouth daily.   multivitamin (RENA-VIT) TABS tablet Take 1 tablet by mouth daily after lunch.   pantoprazole (PROTONIX) 40 MG tablet Take 1 tablet (40 mg total) by mouth daily.   No current facility-administered medications on file prior to visit.     Allergies:   Patient has no known allergies.   Social History   Tobacco Use   Smoking status: Never   Smokeless tobacco:  Never  Vaping Use   Vaping Use: Never used  Substance Use Topics   Alcohol use: Never   Drug use: Never    Family History: family history includes Alcoholism in her father; Cirrhosis in her father; Diabetes in her mother; Heart failure in her mother; Heart murmur in her mother.  ROS:   Please see the history of present illness.   (+) Shortness of breath (+) Fatigue (+) Stuttering Additional pertinent ROS otherwise unremarkable.  EKGs/Labs/Other Studies Reviewed:    The following studies were reviewed today:  TTE 05/09/20 1. Left ventricular ejection fraction, by estimation, is 55 to 60%. The  left ventricle has normal function. The left ventricle has no regional  wall motion abnormalities. There is moderate left ventricular hypertrophy.  Left ventricular diastolic parameters are consistent with Grade I diastolic dysfunction (impaired relaxation).   2. Right ventricular systolic function is normal. The right ventricular  size is normal. There is normal pulmonary artery systolic pressure.   3. Left atrial size was mildly dilated.   4. Exuberant MAC and MV calcification and thickening Mobile calcification at the base of the anterior leaflet likely from MAC but cannot r/o vegetation. The mitral valve is degenerative. Mild mitral valve  regurgitation. No evidence of mitral stenosis.   5. The aortic valve is tricuspid. Aortic valve regurgitation is not  visualized. severe sclerosis without stenosis.   6. The inferior vena cava is normal in size with greater than 50%  respiratory variability, suggesting right atrial pressure of 3 mmHg.   TTE 11/20/19 1. Left ventricular ejection fraction, by visual estimation, is 55 to  60%. The left ventricle has normal function. Left ventricular septal wall  thickness was severely increased. Moderately increased left ventricular  posterior wall thickness.   2. Elevated left atrial and left ventricular end-diastolic pressures.   3. Left  ventricular diastolic parameters are consistent with Grade I  diastolic dysfunction (impaired relaxation).   4. Moderate thickening of the anterior mitral valve leaflet(s).   5. Moderate vegetation on the mitral valve.   6. The mitral valve is abnormal. Moderate to severe mitral valve  regurgitation.   7. Tricuspid valve regurgitation moderate.   8. Tricuspid valve regurgitation moderate.   9. Moderate thickening.  10. Normal pulmonary artery systolic pressure.  11. A prior study was performed on 10/26/2019.  12. Changes from prior study are noted.  13. Compared to the prior TEE, moderate AMVL endocarditis persists with a mobile mass noted on the anterior leaflet ~0.5 cm in diameter, may be  vegetation or ruptured apparatus. There is  moderate to severe, posteriorly  directed MR. Clinical correlation with CHF symptoms is recommended.   TEE 10/26/19  1. Left ventricular ejection fraction, by visual estimation, is 55 to  60%. The left ventricle has normal function. There is mildly increased  left ventricular hypertrophy.   2. Global right ventricle has normal systolic function.The right  ventricular size is normal.   3. Left atrial size was normal.   4. Right atrial size was normal.   5. Trivial pericardial effusion is present.   6. Large vegetation on the mitral valve.   7. The mitral valve is abnormal. Moderate mitral valve regurgitation.   8. The tricuspid valve is grossly normal. Tricuspid valve regurgitation  is mild.   9. The aortic valve is tricuspid. Aortic valve regurgitation is trivial.  Mild aortic valve sclerosis without stenosis.  10. The pulmonic valve was grossly normal. Pulmonic valve regurgitation is mild.  11. Moderate plaque invoving the descending aorta.  12. Normal LV systolic function; large vegetation on anterior MV leaflet  with moderate, eccentric, posterolaterally directed MR; mild TR.   EKG:  EKG is personally reviewed.   08/04/2020: NSR at 87 bpm 06/03/2020:  sinus rhythm at 81 bpm.  Recent Labs: 05/05/2021: BUN 16; Creat 5.30; Hemoglobin 12.0; Platelets 171; Potassium 4.3; Sodium 138  Recent Lipid Panel    Component Value Date/Time   CHOL 120 11/15/2019 0718   TRIG 177 (H) 11/15/2019 0718   HDL 28 (L) 11/15/2019 0718   CHOLHDL 4.3 11/15/2019 0718   VLDL 35 11/15/2019 0718   LDLCALC 57 11/15/2019 0718    Physical Exam:    VS:  BP 92/62   Pulse 87   Ht _0  (1.702 m)   Wt 158 lb 6.4 oz (71.8 kg)   BMI 24.81 kg/m     Wt Readings from Last 3 Encounters:  08/04/21 158 lb 6.4 oz (71.8 kg)  05/05/21 157 lb 10.1 oz (71.5 kg)  08/06/20 153 lb 12.8 oz (69.8 kg)    GEN: frail appearing, but in no acute distress HEENT: Normal, moist mucous membranes NECK: No JVD CARDIAC: regular rhythm, normal S1 and S2, no rubs or gallops. 2.6 holosystolic murmur. VASCULAR: Radial and DP pulses 2+ bilaterally. No carotid bruits RESPIRATORY:  Clear to auscultation without rales, wheezing or rhonchi  ABDOMEN: Soft, non-tender, non-distended MUSCULOSKELETAL:  Ambulates independently SKIN: Warm and dry, no edema NEUROLOGIC:  Alert and oriented x 3. No focal neuro deficits noted. PSYCHIATRIC:  Normal affect    ASSESSMENT:    1. Nonrheumatic mitral valve regurgitation   2. History of bacterial endocarditis   3. History of embolic stroke   4. ESRD on dialysis ([place])   5. Labile blood pressure   6. Type 2 diabetes mellitus without obesity ([place])   7. History of DVT (deep vein thrombosis)     PLAN:    mitral regurgitation, secondary to endocarditis of the anterior mitral valve: -has completed antibiotic treatment -not a surgical candidate given frailty/comorbidities -no active heart failure symptoms -volume management per HD -recent echo rechecked, with improvement in MR. Would continue with afterload reduction and volume management  Multiple embolic strokes, presumed from mitral valve endocarditis: -follows with neurology  ESRD, on  dialysis: -has intermittent hypotension with dialysis. Would continue afterload reduction if blood pressure allows, but may may ned to scale back on carvedilol/imdur in the future if hypotension worsens  Hypertension: labile blood pressure -as above, swings between hypertension and hypotension with dialysis -on carvedilol 12.5 mg BID -on  imdur 60 mg daily -would consider holding these medications the morning of dialysis, but will defer to her nephrology team  Type II diabetes: -on insulin  Right IJ DVT: -unclear as to the long term management of this. She is unsure of the plan. She does have a tunneled catheter on the right side. Per notes, she has had difficult access and unable to get a fistula in arms -she denies any other history of clot to me -will send to her [facility] dialysis team in [place]. Unsure of who her primary nephrologist is.  -if there is another long term indication (ie concern with long term tunneled cath/risk for clot), could consider indefinite anticoagulation. Otherwise, would consider rechecking IJ for clot and discontinuing apixaban if no other indication. She denies bleeding issues currently, she is comfortable continuing apixaban. Defer to her PCP and nephrology on other indications for anticoagulation -had labile INRs on coumadin  Plan for follow up: 1 year or sooner as needed  Buford Dresser, MD, PhD [place]  [facility]    Medication Adjustments/Labs and Tests Ordered: Current medicines are reviewed at length with the patient today.  Concerns regarding medicines are outlined above.  Orders Placed This Encounter  Procedures   EKG 12-Lead    No orders of the defined types were placed in this encounter.   Patient Instructions  Medication Instructions:  Your physician recommends that you continue on your current medications as directed. Please refer to the Current Medication list given to you today.   *If you need a refill on your cardiac  medications before your next appointment, please call your pharmacy*  Lab Work: NONE   Testing/Procedures: NONE  Follow-Up: At [facility], you and your health needs are our priority.  As part of our continuing mission to provide you with exceptional heart care, we have created designated Provider Care Teams.  These Care Teams include your primary Cardiologist (physician) and Advanced Practice Providers (APPs -  Physician Assistants and Nurse Practitioners) who all work together to provide you with the care you need, when you need it.  We recommend signing up for the patient portal called "MyChart".  Sign up information is provided on this After Visit Summary.  MyChart is used to connect with patients for Virtual Visits (Telemedicine).  Patients are able to view lab/test results, encounter notes, upcoming appointments, etc.  Non-urgent messages can be sent to your provider as well.   To learn more about what you can do with MyChart, go to [email or web address].    Your next appointment:   12 month(s)  The format for your next appointment:   In Person  Provider:   Buford Dresser, MD      [facility] [place],acting as a scribe for Buford Dresser, MD.,have documented all relevant documentation on the behalf of Buford Dresser, MD,as directed by  Buford Dresser, MD while in the presence of Buford Dresser, MD.  I, Buford Dresser, MD, have reviewed all documentation for this visit. The documentation on 08/04/21 for the exam, diagnosis, procedures, and orders are all accurate and complete.   Signed, Buford Dresser, MD PhD 08/04/2021     [place]

## 2021-08-04 NOTE — Patient Instructions (Signed)
Medication Instructions:  Your physician recommends that you continue on your current medications as directed. Please refer to the Current Medication list given to you today.   *If you need a refill on your cardiac medications before your next appointment, please call your pharmacy*  Lab Work: NONE  Testing/Procedures: NONE  Follow-Up: At [facility], you and your health needs are our priority.  As part of our continuing mission to provide you with exceptional heart care, we have created designated Provider Care Teams.  These Care Teams include your primary Cardiologist (physician) and Advanced Practice Providers (APPs -  Physician Assistants and Nurse Practitioners) who all work together to provide you with the care you need, when you need it.  We recommend signing up for the patient portal called "MyChart".  Sign up information is provided on this After Visit Summary.  MyChart is used to connect with patients for Virtual Visits (Telemedicine).  Patients are able to view lab/test results, encounter notes, upcoming appointments, etc.  Non-urgent messages can be sent to your provider as well.   To learn more about what you can do with MyChart, go to [email or web address].    Your next appointment:   12 month(s)  The format for your next appointment:   In Person  Provider:   Bridgette Christopher, MD     

## 2021-08-06 DIAGNOSIS — N186 End stage renal disease: Secondary | ICD-10-CM | POA: Diagnosis not present

## 2021-08-06 DIAGNOSIS — Z992 Dependence on renal dialysis: Secondary | ICD-10-CM | POA: Diagnosis not present

## 2021-08-08 DIAGNOSIS — Z992 Dependence on renal dialysis: Secondary | ICD-10-CM | POA: Diagnosis not present

## 2021-08-08 DIAGNOSIS — N186 End stage renal disease: Secondary | ICD-10-CM | POA: Diagnosis not present

## 2021-08-11 DIAGNOSIS — N186 End stage renal disease: Secondary | ICD-10-CM | POA: Diagnosis not present

## 2021-08-11 DIAGNOSIS — Z992 Dependence on renal dialysis: Secondary | ICD-10-CM | POA: Diagnosis not present

## 2021-08-13 DIAGNOSIS — Z992 Dependence on renal dialysis: Secondary | ICD-10-CM | POA: Diagnosis not present

## 2021-08-13 DIAGNOSIS — N186 End stage renal disease: Secondary | ICD-10-CM | POA: Diagnosis not present

## 2021-08-15 DIAGNOSIS — Z992 Dependence on renal dialysis: Secondary | ICD-10-CM | POA: Diagnosis not present

## 2021-08-15 DIAGNOSIS — N186 End stage renal disease: Secondary | ICD-10-CM | POA: Diagnosis not present

## 2021-08-18 DIAGNOSIS — N186 End stage renal disease: Secondary | ICD-10-CM | POA: Diagnosis not present

## 2021-08-18 DIAGNOSIS — Z992 Dependence on renal dialysis: Secondary | ICD-10-CM | POA: Diagnosis not present

## 2021-08-20 DIAGNOSIS — Z992 Dependence on renal dialysis: Secondary | ICD-10-CM | POA: Diagnosis not present

## 2021-08-20 DIAGNOSIS — M545 Low back pain, unspecified: Secondary | ICD-10-CM | POA: Diagnosis not present

## 2021-08-20 DIAGNOSIS — Z6825 Body mass index (BMI) 25.0-25.9, adult: Secondary | ICD-10-CM | POA: Diagnosis not present

## 2021-08-20 DIAGNOSIS — N186 End stage renal disease: Secondary | ICD-10-CM | POA: Diagnosis not present

## 2021-08-21 DIAGNOSIS — R300 Dysuria: Secondary | ICD-10-CM | POA: Diagnosis not present

## 2021-08-22 DIAGNOSIS — Z992 Dependence on renal dialysis: Secondary | ICD-10-CM | POA: Diagnosis not present

## 2021-08-22 DIAGNOSIS — E1122 Type 2 diabetes mellitus with diabetic chronic kidney disease: Secondary | ICD-10-CM | POA: Diagnosis not present

## 2021-08-22 DIAGNOSIS — N186 End stage renal disease: Secondary | ICD-10-CM | POA: Diagnosis not present

## 2021-08-22 DIAGNOSIS — N184 Chronic kidney disease, stage 4 (severe): Secondary | ICD-10-CM | POA: Diagnosis not present

## 2021-08-22 DIAGNOSIS — I129 Hypertensive chronic kidney disease with stage 1 through stage 4 chronic kidney disease, or unspecified chronic kidney disease: Secondary | ICD-10-CM | POA: Diagnosis not present

## 2021-08-25 DIAGNOSIS — Z992 Dependence on renal dialysis: Secondary | ICD-10-CM | POA: Diagnosis not present

## 2021-08-25 DIAGNOSIS — N186 End stage renal disease: Secondary | ICD-10-CM | POA: Diagnosis not present

## 2021-08-27 DIAGNOSIS — N186 End stage renal disease: Secondary | ICD-10-CM | POA: Diagnosis not present

## 2021-08-27 DIAGNOSIS — Z992 Dependence on renal dialysis: Secondary | ICD-10-CM | POA: Diagnosis not present

## 2021-08-29 DIAGNOSIS — N186 End stage renal disease: Secondary | ICD-10-CM | POA: Diagnosis not present

## 2021-08-29 DIAGNOSIS — Z992 Dependence on renal dialysis: Secondary | ICD-10-CM | POA: Diagnosis not present

## 2021-09-01 DIAGNOSIS — Z992 Dependence on renal dialysis: Secondary | ICD-10-CM | POA: Diagnosis not present

## 2021-09-01 DIAGNOSIS — Z794 Long term (current) use of insulin: Secondary | ICD-10-CM | POA: Diagnosis not present

## 2021-09-01 DIAGNOSIS — E119 Type 2 diabetes mellitus without complications: Secondary | ICD-10-CM | POA: Diagnosis not present

## 2021-09-01 DIAGNOSIS — N186 End stage renal disease: Secondary | ICD-10-CM | POA: Diagnosis not present

## 2021-09-02 DIAGNOSIS — Z1231 Encounter for screening mammogram for malignant neoplasm of breast: Secondary | ICD-10-CM | POA: Diagnosis not present

## 2021-09-03 DIAGNOSIS — N186 End stage renal disease: Secondary | ICD-10-CM | POA: Diagnosis not present

## 2021-09-03 DIAGNOSIS — Z992 Dependence on renal dialysis: Secondary | ICD-10-CM | POA: Diagnosis not present

## 2021-09-05 DIAGNOSIS — N186 End stage renal disease: Secondary | ICD-10-CM | POA: Diagnosis not present

## 2021-09-05 DIAGNOSIS — Z992 Dependence on renal dialysis: Secondary | ICD-10-CM | POA: Diagnosis not present

## 2021-09-08 DIAGNOSIS — N186 End stage renal disease: Secondary | ICD-10-CM | POA: Diagnosis not present

## 2021-09-08 DIAGNOSIS — Z992 Dependence on renal dialysis: Secondary | ICD-10-CM | POA: Diagnosis not present

## 2021-09-10 DIAGNOSIS — N186 End stage renal disease: Secondary | ICD-10-CM | POA: Diagnosis not present

## 2021-09-10 DIAGNOSIS — Z992 Dependence on renal dialysis: Secondary | ICD-10-CM | POA: Diagnosis not present

## 2021-09-12 DIAGNOSIS — N186 End stage renal disease: Secondary | ICD-10-CM | POA: Diagnosis not present

## 2021-09-12 DIAGNOSIS — Z992 Dependence on renal dialysis: Secondary | ICD-10-CM | POA: Diagnosis not present

## 2021-09-15 DIAGNOSIS — Z992 Dependence on renal dialysis: Secondary | ICD-10-CM | POA: Diagnosis not present

## 2021-09-15 DIAGNOSIS — N186 End stage renal disease: Secondary | ICD-10-CM | POA: Diagnosis not present

## 2021-09-17 DIAGNOSIS — N186 End stage renal disease: Secondary | ICD-10-CM | POA: Diagnosis not present

## 2021-09-17 DIAGNOSIS — I259 Chronic ischemic heart disease, unspecified: Secondary | ICD-10-CM | POA: Diagnosis not present

## 2021-09-17 DIAGNOSIS — Z992 Dependence on renal dialysis: Secondary | ICD-10-CM | POA: Diagnosis not present

## 2021-09-19 DIAGNOSIS — R922 Inconclusive mammogram: Secondary | ICD-10-CM | POA: Diagnosis not present

## 2021-09-19 DIAGNOSIS — Z992 Dependence on renal dialysis: Secondary | ICD-10-CM | POA: Diagnosis not present

## 2021-09-19 DIAGNOSIS — N186 End stage renal disease: Secondary | ICD-10-CM | POA: Diagnosis not present

## 2021-09-19 DIAGNOSIS — N6489 Other specified disorders of breast: Secondary | ICD-10-CM | POA: Diagnosis not present

## 2021-09-22 DIAGNOSIS — Z992 Dependence on renal dialysis: Secondary | ICD-10-CM | POA: Diagnosis not present

## 2021-09-22 DIAGNOSIS — N186 End stage renal disease: Secondary | ICD-10-CM | POA: Diagnosis not present

## 2021-09-24 DIAGNOSIS — N186 End stage renal disease: Secondary | ICD-10-CM | POA: Diagnosis not present

## 2021-09-24 DIAGNOSIS — Z992 Dependence on renal dialysis: Secondary | ICD-10-CM | POA: Diagnosis not present

## 2021-09-26 DIAGNOSIS — Z992 Dependence on renal dialysis: Secondary | ICD-10-CM | POA: Diagnosis not present

## 2021-09-26 DIAGNOSIS — N186 End stage renal disease: Secondary | ICD-10-CM | POA: Diagnosis not present

## 2021-09-29 DIAGNOSIS — N186 End stage renal disease: Secondary | ICD-10-CM | POA: Diagnosis not present

## 2021-09-29 DIAGNOSIS — Z992 Dependence on renal dialysis: Secondary | ICD-10-CM | POA: Diagnosis not present

## 2021-10-01 DIAGNOSIS — N186 End stage renal disease: Secondary | ICD-10-CM | POA: Diagnosis not present

## 2021-10-01 DIAGNOSIS — Z992 Dependence on renal dialysis: Secondary | ICD-10-CM | POA: Diagnosis not present

## 2021-10-03 DIAGNOSIS — Z992 Dependence on renal dialysis: Secondary | ICD-10-CM | POA: Diagnosis not present

## 2021-10-03 DIAGNOSIS — N186 End stage renal disease: Secondary | ICD-10-CM | POA: Diagnosis not present

## 2021-10-06 DIAGNOSIS — N186 End stage renal disease: Secondary | ICD-10-CM | POA: Diagnosis not present

## 2021-10-06 DIAGNOSIS — Z992 Dependence on renal dialysis: Secondary | ICD-10-CM | POA: Diagnosis not present

## 2021-10-08 DIAGNOSIS — Z992 Dependence on renal dialysis: Secondary | ICD-10-CM | POA: Diagnosis not present

## 2021-10-08 DIAGNOSIS — N186 End stage renal disease: Secondary | ICD-10-CM | POA: Diagnosis not present

## 2021-10-10 DIAGNOSIS — N186 End stage renal disease: Secondary | ICD-10-CM | POA: Diagnosis not present

## 2021-10-10 DIAGNOSIS — Z992 Dependence on renal dialysis: Secondary | ICD-10-CM | POA: Diagnosis not present

## 2021-10-13 DIAGNOSIS — Z992 Dependence on renal dialysis: Secondary | ICD-10-CM | POA: Diagnosis not present

## 2021-10-13 DIAGNOSIS — N186 End stage renal disease: Secondary | ICD-10-CM | POA: Diagnosis not present

## 2021-10-15 DIAGNOSIS — N186 End stage renal disease: Secondary | ICD-10-CM | POA: Diagnosis not present

## 2021-10-15 DIAGNOSIS — Z992 Dependence on renal dialysis: Secondary | ICD-10-CM | POA: Diagnosis not present

## 2021-10-17 DIAGNOSIS — Z992 Dependence on renal dialysis: Secondary | ICD-10-CM | POA: Diagnosis not present

## 2021-10-17 DIAGNOSIS — N186 End stage renal disease: Secondary | ICD-10-CM | POA: Diagnosis not present

## 2021-10-20 DIAGNOSIS — Z992 Dependence on renal dialysis: Secondary | ICD-10-CM | POA: Diagnosis not present

## 2021-10-20 DIAGNOSIS — N186 End stage renal disease: Secondary | ICD-10-CM | POA: Diagnosis not present

## 2021-10-22 DIAGNOSIS — N186 End stage renal disease: Secondary | ICD-10-CM | POA: Diagnosis not present

## 2021-10-22 DIAGNOSIS — Z992 Dependence on renal dialysis: Secondary | ICD-10-CM | POA: Diagnosis not present

## 2021-10-24 DIAGNOSIS — Z992 Dependence on renal dialysis: Secondary | ICD-10-CM | POA: Diagnosis not present

## 2021-10-24 DIAGNOSIS — N2581 Secondary hyperparathyroidism of renal origin: Secondary | ICD-10-CM | POA: Diagnosis not present

## 2021-10-24 DIAGNOSIS — N186 End stage renal disease: Secondary | ICD-10-CM | POA: Diagnosis not present

## 2021-10-27 DIAGNOSIS — N186 End stage renal disease: Secondary | ICD-10-CM | POA: Diagnosis not present

## 2021-10-27 DIAGNOSIS — N2581 Secondary hyperparathyroidism of renal origin: Secondary | ICD-10-CM | POA: Diagnosis not present

## 2021-10-27 DIAGNOSIS — Z992 Dependence on renal dialysis: Secondary | ICD-10-CM | POA: Diagnosis not present

## 2021-10-29 DIAGNOSIS — N2581 Secondary hyperparathyroidism of renal origin: Secondary | ICD-10-CM | POA: Diagnosis not present

## 2021-10-29 DIAGNOSIS — N186 End stage renal disease: Secondary | ICD-10-CM | POA: Diagnosis not present

## 2021-10-29 DIAGNOSIS — Z992 Dependence on renal dialysis: Secondary | ICD-10-CM | POA: Diagnosis not present

## 2021-10-31 DIAGNOSIS — N186 End stage renal disease: Secondary | ICD-10-CM | POA: Diagnosis not present

## 2021-10-31 DIAGNOSIS — N2581 Secondary hyperparathyroidism of renal origin: Secondary | ICD-10-CM | POA: Diagnosis not present

## 2021-10-31 DIAGNOSIS — Z992 Dependence on renal dialysis: Secondary | ICD-10-CM | POA: Diagnosis not present

## 2021-11-03 DIAGNOSIS — N2581 Secondary hyperparathyroidism of renal origin: Secondary | ICD-10-CM | POA: Diagnosis not present

## 2021-11-03 DIAGNOSIS — Z992 Dependence on renal dialysis: Secondary | ICD-10-CM | POA: Diagnosis not present

## 2021-11-03 DIAGNOSIS — N186 End stage renal disease: Secondary | ICD-10-CM | POA: Diagnosis not present

## 2021-11-05 DIAGNOSIS — Z992 Dependence on renal dialysis: Secondary | ICD-10-CM | POA: Diagnosis not present

## 2021-11-05 DIAGNOSIS — N186 End stage renal disease: Secondary | ICD-10-CM | POA: Diagnosis not present

## 2021-11-05 DIAGNOSIS — N2581 Secondary hyperparathyroidism of renal origin: Secondary | ICD-10-CM | POA: Diagnosis not present

## 2021-11-07 DIAGNOSIS — N186 End stage renal disease: Secondary | ICD-10-CM | POA: Diagnosis not present

## 2021-11-07 DIAGNOSIS — Z992 Dependence on renal dialysis: Secondary | ICD-10-CM | POA: Diagnosis not present

## 2021-11-07 DIAGNOSIS — N2581 Secondary hyperparathyroidism of renal origin: Secondary | ICD-10-CM | POA: Diagnosis not present

## 2021-11-10 DIAGNOSIS — N186 End stage renal disease: Secondary | ICD-10-CM | POA: Diagnosis not present

## 2021-11-10 DIAGNOSIS — Z992 Dependence on renal dialysis: Secondary | ICD-10-CM | POA: Diagnosis not present

## 2021-11-10 DIAGNOSIS — N2581 Secondary hyperparathyroidism of renal origin: Secondary | ICD-10-CM | POA: Diagnosis not present

## 2021-11-12 DIAGNOSIS — Z794 Long term (current) use of insulin: Secondary | ICD-10-CM | POA: Diagnosis not present

## 2021-11-12 DIAGNOSIS — N2581 Secondary hyperparathyroidism of renal origin: Secondary | ICD-10-CM | POA: Diagnosis not present

## 2021-11-12 DIAGNOSIS — E119 Type 2 diabetes mellitus without complications: Secondary | ICD-10-CM | POA: Diagnosis not present

## 2021-11-12 DIAGNOSIS — N186 End stage renal disease: Secondary | ICD-10-CM | POA: Diagnosis not present

## 2021-11-12 DIAGNOSIS — Z992 Dependence on renal dialysis: Secondary | ICD-10-CM | POA: Diagnosis not present

## 2021-11-14 DIAGNOSIS — Z992 Dependence on renal dialysis: Secondary | ICD-10-CM | POA: Diagnosis not present

## 2021-11-14 DIAGNOSIS — N186 End stage renal disease: Secondary | ICD-10-CM | POA: Diagnosis not present

## 2021-11-14 DIAGNOSIS — N2581 Secondary hyperparathyroidism of renal origin: Secondary | ICD-10-CM | POA: Diagnosis not present

## 2021-11-17 DIAGNOSIS — N186 End stage renal disease: Secondary | ICD-10-CM | POA: Diagnosis not present

## 2021-11-17 DIAGNOSIS — N2581 Secondary hyperparathyroidism of renal origin: Secondary | ICD-10-CM | POA: Diagnosis not present

## 2021-11-17 DIAGNOSIS — Z992 Dependence on renal dialysis: Secondary | ICD-10-CM | POA: Diagnosis not present

## 2021-11-19 DIAGNOSIS — N2581 Secondary hyperparathyroidism of renal origin: Secondary | ICD-10-CM | POA: Diagnosis not present

## 2021-11-19 DIAGNOSIS — Z992 Dependence on renal dialysis: Secondary | ICD-10-CM | POA: Diagnosis not present

## 2021-11-19 DIAGNOSIS — N186 End stage renal disease: Secondary | ICD-10-CM | POA: Diagnosis not present

## 2021-11-21 DIAGNOSIS — Z992 Dependence on renal dialysis: Secondary | ICD-10-CM | POA: Diagnosis not present

## 2021-11-21 DIAGNOSIS — N2581 Secondary hyperparathyroidism of renal origin: Secondary | ICD-10-CM | POA: Diagnosis not present

## 2021-11-21 DIAGNOSIS — N186 End stage renal disease: Secondary | ICD-10-CM | POA: Diagnosis not present

## 2021-11-22 DIAGNOSIS — N186 End stage renal disease: Secondary | ICD-10-CM | POA: Diagnosis not present

## 2021-11-22 DIAGNOSIS — Z992 Dependence on renal dialysis: Secondary | ICD-10-CM | POA: Diagnosis not present

## 2021-11-24 DIAGNOSIS — N186 End stage renal disease: Secondary | ICD-10-CM | POA: Diagnosis not present

## 2021-11-24 DIAGNOSIS — N2581 Secondary hyperparathyroidism of renal origin: Secondary | ICD-10-CM | POA: Diagnosis not present

## 2021-11-24 DIAGNOSIS — Z992 Dependence on renal dialysis: Secondary | ICD-10-CM | POA: Diagnosis not present

## 2021-11-26 DIAGNOSIS — N2581 Secondary hyperparathyroidism of renal origin: Secondary | ICD-10-CM | POA: Diagnosis not present

## 2021-11-26 DIAGNOSIS — Z992 Dependence on renal dialysis: Secondary | ICD-10-CM | POA: Diagnosis not present

## 2021-11-26 DIAGNOSIS — N186 End stage renal disease: Secondary | ICD-10-CM | POA: Diagnosis not present

## 2021-11-28 DIAGNOSIS — Z992 Dependence on renal dialysis: Secondary | ICD-10-CM | POA: Diagnosis not present

## 2021-11-28 DIAGNOSIS — N2581 Secondary hyperparathyroidism of renal origin: Secondary | ICD-10-CM | POA: Diagnosis not present

## 2021-11-28 DIAGNOSIS — N186 End stage renal disease: Secondary | ICD-10-CM | POA: Diagnosis not present

## 2021-12-01 DIAGNOSIS — N2581 Secondary hyperparathyroidism of renal origin: Secondary | ICD-10-CM | POA: Diagnosis not present

## 2021-12-01 DIAGNOSIS — Z992 Dependence on renal dialysis: Secondary | ICD-10-CM | POA: Diagnosis not present

## 2021-12-01 DIAGNOSIS — N186 End stage renal disease: Secondary | ICD-10-CM | POA: Diagnosis not present

## 2021-12-03 DIAGNOSIS — Z992 Dependence on renal dialysis: Secondary | ICD-10-CM | POA: Diagnosis not present

## 2021-12-03 DIAGNOSIS — N2581 Secondary hyperparathyroidism of renal origin: Secondary | ICD-10-CM | POA: Diagnosis not present

## 2021-12-03 DIAGNOSIS — N186 End stage renal disease: Secondary | ICD-10-CM | POA: Diagnosis not present

## 2021-12-05 DIAGNOSIS — N2581 Secondary hyperparathyroidism of renal origin: Secondary | ICD-10-CM | POA: Diagnosis not present

## 2021-12-05 DIAGNOSIS — Z992 Dependence on renal dialysis: Secondary | ICD-10-CM | POA: Diagnosis not present

## 2021-12-05 DIAGNOSIS — N186 End stage renal disease: Secondary | ICD-10-CM | POA: Diagnosis not present

## 2021-12-08 DIAGNOSIS — N186 End stage renal disease: Secondary | ICD-10-CM | POA: Diagnosis not present

## 2021-12-08 DIAGNOSIS — Z992 Dependence on renal dialysis: Secondary | ICD-10-CM | POA: Diagnosis not present

## 2021-12-08 DIAGNOSIS — N2581 Secondary hyperparathyroidism of renal origin: Secondary | ICD-10-CM | POA: Diagnosis not present

## 2021-12-10 DIAGNOSIS — N2581 Secondary hyperparathyroidism of renal origin: Secondary | ICD-10-CM | POA: Diagnosis not present

## 2021-12-10 DIAGNOSIS — N186 End stage renal disease: Secondary | ICD-10-CM | POA: Diagnosis not present

## 2021-12-10 DIAGNOSIS — Z992 Dependence on renal dialysis: Secondary | ICD-10-CM | POA: Diagnosis not present

## 2021-12-12 DIAGNOSIS — N2581 Secondary hyperparathyroidism of renal origin: Secondary | ICD-10-CM | POA: Diagnosis not present

## 2021-12-12 DIAGNOSIS — Z992 Dependence on renal dialysis: Secondary | ICD-10-CM | POA: Diagnosis not present

## 2021-12-12 DIAGNOSIS — N186 End stage renal disease: Secondary | ICD-10-CM | POA: Diagnosis not present

## 2021-12-15 DIAGNOSIS — Z992 Dependence on renal dialysis: Secondary | ICD-10-CM | POA: Diagnosis not present

## 2021-12-15 DIAGNOSIS — N2581 Secondary hyperparathyroidism of renal origin: Secondary | ICD-10-CM | POA: Diagnosis not present

## 2021-12-15 DIAGNOSIS — N186 End stage renal disease: Secondary | ICD-10-CM | POA: Diagnosis not present

## 2021-12-17 DIAGNOSIS — N186 End stage renal disease: Secondary | ICD-10-CM | POA: Diagnosis not present

## 2021-12-17 DIAGNOSIS — Z992 Dependence on renal dialysis: Secondary | ICD-10-CM | POA: Diagnosis not present

## 2021-12-17 DIAGNOSIS — N2581 Secondary hyperparathyroidism of renal origin: Secondary | ICD-10-CM | POA: Diagnosis not present

## 2021-12-18 DIAGNOSIS — M810 Age-related osteoporosis without current pathological fracture: Secondary | ICD-10-CM | POA: Diagnosis not present

## 2021-12-19 DIAGNOSIS — N186 End stage renal disease: Secondary | ICD-10-CM | POA: Diagnosis not present

## 2021-12-19 DIAGNOSIS — N2581 Secondary hyperparathyroidism of renal origin: Secondary | ICD-10-CM | POA: Diagnosis not present

## 2021-12-19 DIAGNOSIS — Z992 Dependence on renal dialysis: Secondary | ICD-10-CM | POA: Diagnosis not present

## 2021-12-22 DIAGNOSIS — N186 End stage renal disease: Secondary | ICD-10-CM | POA: Diagnosis not present

## 2021-12-22 DIAGNOSIS — Z992 Dependence on renal dialysis: Secondary | ICD-10-CM | POA: Diagnosis not present

## 2021-12-22 DIAGNOSIS — N2581 Secondary hyperparathyroidism of renal origin: Secondary | ICD-10-CM | POA: Diagnosis not present

## 2021-12-23 DIAGNOSIS — Z992 Dependence on renal dialysis: Secondary | ICD-10-CM | POA: Diagnosis not present

## 2021-12-23 DIAGNOSIS — N186 End stage renal disease: Secondary | ICD-10-CM | POA: Diagnosis not present

## 2021-12-24 DIAGNOSIS — N186 End stage renal disease: Secondary | ICD-10-CM | POA: Diagnosis not present

## 2021-12-24 DIAGNOSIS — Z992 Dependence on renal dialysis: Secondary | ICD-10-CM | POA: Diagnosis not present

## 2021-12-24 DIAGNOSIS — N2581 Secondary hyperparathyroidism of renal origin: Secondary | ICD-10-CM | POA: Diagnosis not present

## 2021-12-26 DIAGNOSIS — N186 End stage renal disease: Secondary | ICD-10-CM | POA: Diagnosis not present

## 2021-12-26 DIAGNOSIS — N2581 Secondary hyperparathyroidism of renal origin: Secondary | ICD-10-CM | POA: Diagnosis not present

## 2021-12-26 DIAGNOSIS — Z992 Dependence on renal dialysis: Secondary | ICD-10-CM | POA: Diagnosis not present

## 2021-12-29 DIAGNOSIS — Z992 Dependence on renal dialysis: Secondary | ICD-10-CM | POA: Diagnosis not present

## 2021-12-29 DIAGNOSIS — N2581 Secondary hyperparathyroidism of renal origin: Secondary | ICD-10-CM | POA: Diagnosis not present

## 2021-12-29 DIAGNOSIS — N186 End stage renal disease: Secondary | ICD-10-CM | POA: Diagnosis not present

## 2021-12-31 DIAGNOSIS — Z992 Dependence on renal dialysis: Secondary | ICD-10-CM | POA: Diagnosis not present

## 2021-12-31 DIAGNOSIS — N186 End stage renal disease: Secondary | ICD-10-CM | POA: Diagnosis not present

## 2021-12-31 DIAGNOSIS — N2581 Secondary hyperparathyroidism of renal origin: Secondary | ICD-10-CM | POA: Diagnosis not present

## 2022-01-02 DIAGNOSIS — Z992 Dependence on renal dialysis: Secondary | ICD-10-CM | POA: Diagnosis not present

## 2022-01-02 DIAGNOSIS — N186 End stage renal disease: Secondary | ICD-10-CM | POA: Diagnosis not present

## 2022-01-02 DIAGNOSIS — N2581 Secondary hyperparathyroidism of renal origin: Secondary | ICD-10-CM | POA: Diagnosis not present

## 2022-01-05 DIAGNOSIS — N2581 Secondary hyperparathyroidism of renal origin: Secondary | ICD-10-CM | POA: Diagnosis not present

## 2022-01-05 DIAGNOSIS — N186 End stage renal disease: Secondary | ICD-10-CM | POA: Diagnosis not present

## 2022-01-05 DIAGNOSIS — Z992 Dependence on renal dialysis: Secondary | ICD-10-CM | POA: Diagnosis not present

## 2022-01-07 DIAGNOSIS — Z992 Dependence on renal dialysis: Secondary | ICD-10-CM | POA: Diagnosis not present

## 2022-01-07 DIAGNOSIS — N186 End stage renal disease: Secondary | ICD-10-CM | POA: Diagnosis not present

## 2022-01-07 DIAGNOSIS — N2581 Secondary hyperparathyroidism of renal origin: Secondary | ICD-10-CM | POA: Diagnosis not present

## 2022-01-09 DIAGNOSIS — N186 End stage renal disease: Secondary | ICD-10-CM | POA: Diagnosis not present

## 2022-01-09 DIAGNOSIS — N2581 Secondary hyperparathyroidism of renal origin: Secondary | ICD-10-CM | POA: Diagnosis not present

## 2022-01-09 DIAGNOSIS — Z992 Dependence on renal dialysis: Secondary | ICD-10-CM | POA: Diagnosis not present

## 2022-01-12 DIAGNOSIS — N186 End stage renal disease: Secondary | ICD-10-CM | POA: Diagnosis not present

## 2022-01-12 DIAGNOSIS — Z992 Dependence on renal dialysis: Secondary | ICD-10-CM | POA: Diagnosis not present

## 2022-01-12 DIAGNOSIS — N2581 Secondary hyperparathyroidism of renal origin: Secondary | ICD-10-CM | POA: Diagnosis not present

## 2022-01-14 DIAGNOSIS — N2581 Secondary hyperparathyroidism of renal origin: Secondary | ICD-10-CM | POA: Diagnosis not present

## 2022-01-14 DIAGNOSIS — Z992 Dependence on renal dialysis: Secondary | ICD-10-CM | POA: Diagnosis not present

## 2022-01-14 DIAGNOSIS — N186 End stage renal disease: Secondary | ICD-10-CM | POA: Diagnosis not present

## 2022-01-16 DIAGNOSIS — N186 End stage renal disease: Secondary | ICD-10-CM | POA: Diagnosis not present

## 2022-01-16 DIAGNOSIS — N2581 Secondary hyperparathyroidism of renal origin: Secondary | ICD-10-CM | POA: Diagnosis not present

## 2022-01-16 DIAGNOSIS — Z992 Dependence on renal dialysis: Secondary | ICD-10-CM | POA: Diagnosis not present

## 2022-01-19 DIAGNOSIS — Z992 Dependence on renal dialysis: Secondary | ICD-10-CM | POA: Diagnosis not present

## 2022-01-19 DIAGNOSIS — N2581 Secondary hyperparathyroidism of renal origin: Secondary | ICD-10-CM | POA: Diagnosis not present

## 2022-01-19 DIAGNOSIS — N186 End stage renal disease: Secondary | ICD-10-CM | POA: Diagnosis not present

## 2022-01-20 DIAGNOSIS — I129 Hypertensive chronic kidney disease with stage 1 through stage 4 chronic kidney disease, or unspecified chronic kidney disease: Secondary | ICD-10-CM | POA: Diagnosis not present

## 2022-01-20 DIAGNOSIS — Z992 Dependence on renal dialysis: Secondary | ICD-10-CM | POA: Diagnosis not present

## 2022-01-20 DIAGNOSIS — N186 End stage renal disease: Secondary | ICD-10-CM | POA: Diagnosis not present

## 2022-01-20 DIAGNOSIS — N184 Chronic kidney disease, stage 4 (severe): Secondary | ICD-10-CM | POA: Diagnosis not present

## 2022-01-20 DIAGNOSIS — E1122 Type 2 diabetes mellitus with diabetic chronic kidney disease: Secondary | ICD-10-CM | POA: Diagnosis not present

## 2022-01-21 DIAGNOSIS — N186 End stage renal disease: Secondary | ICD-10-CM | POA: Diagnosis not present

## 2022-01-21 DIAGNOSIS — N2581 Secondary hyperparathyroidism of renal origin: Secondary | ICD-10-CM | POA: Diagnosis not present

## 2022-01-21 DIAGNOSIS — Z992 Dependence on renal dialysis: Secondary | ICD-10-CM | POA: Diagnosis not present

## 2022-01-23 DIAGNOSIS — N186 End stage renal disease: Secondary | ICD-10-CM | POA: Diagnosis not present

## 2022-01-23 DIAGNOSIS — N2581 Secondary hyperparathyroidism of renal origin: Secondary | ICD-10-CM | POA: Diagnosis not present

## 2022-01-23 DIAGNOSIS — Z992 Dependence on renal dialysis: Secondary | ICD-10-CM | POA: Diagnosis not present

## 2022-01-26 DIAGNOSIS — N186 End stage renal disease: Secondary | ICD-10-CM | POA: Diagnosis not present

## 2022-01-26 DIAGNOSIS — Z992 Dependence on renal dialysis: Secondary | ICD-10-CM | POA: Diagnosis not present

## 2022-01-26 DIAGNOSIS — N2581 Secondary hyperparathyroidism of renal origin: Secondary | ICD-10-CM | POA: Diagnosis not present

## 2022-01-28 DIAGNOSIS — N2581 Secondary hyperparathyroidism of renal origin: Secondary | ICD-10-CM | POA: Diagnosis not present

## 2022-01-28 DIAGNOSIS — Z992 Dependence on renal dialysis: Secondary | ICD-10-CM | POA: Diagnosis not present

## 2022-01-28 DIAGNOSIS — N186 End stage renal disease: Secondary | ICD-10-CM | POA: Diagnosis not present

## 2022-01-30 DIAGNOSIS — Z992 Dependence on renal dialysis: Secondary | ICD-10-CM | POA: Diagnosis not present

## 2022-01-30 DIAGNOSIS — N2581 Secondary hyperparathyroidism of renal origin: Secondary | ICD-10-CM | POA: Diagnosis not present

## 2022-01-30 DIAGNOSIS — N186 End stage renal disease: Secondary | ICD-10-CM | POA: Diagnosis not present

## 2022-02-02 DIAGNOSIS — Z992 Dependence on renal dialysis: Secondary | ICD-10-CM | POA: Diagnosis not present

## 2022-02-02 DIAGNOSIS — N186 End stage renal disease: Secondary | ICD-10-CM | POA: Diagnosis not present

## 2022-02-02 DIAGNOSIS — N2581 Secondary hyperparathyroidism of renal origin: Secondary | ICD-10-CM | POA: Diagnosis not present

## 2022-02-04 DIAGNOSIS — N2581 Secondary hyperparathyroidism of renal origin: Secondary | ICD-10-CM | POA: Diagnosis not present

## 2022-02-04 DIAGNOSIS — Z992 Dependence on renal dialysis: Secondary | ICD-10-CM | POA: Diagnosis not present

## 2022-02-04 DIAGNOSIS — N186 End stage renal disease: Secondary | ICD-10-CM | POA: Diagnosis not present

## 2022-02-06 DIAGNOSIS — N2581 Secondary hyperparathyroidism of renal origin: Secondary | ICD-10-CM | POA: Diagnosis not present

## 2022-02-06 DIAGNOSIS — Z992 Dependence on renal dialysis: Secondary | ICD-10-CM | POA: Diagnosis not present

## 2022-02-06 DIAGNOSIS — N186 End stage renal disease: Secondary | ICD-10-CM | POA: Diagnosis not present

## 2022-02-09 DIAGNOSIS — N186 End stage renal disease: Secondary | ICD-10-CM | POA: Diagnosis not present

## 2022-02-09 DIAGNOSIS — N2581 Secondary hyperparathyroidism of renal origin: Secondary | ICD-10-CM | POA: Diagnosis not present

## 2022-02-09 DIAGNOSIS — Z992 Dependence on renal dialysis: Secondary | ICD-10-CM | POA: Diagnosis not present

## 2022-02-11 DIAGNOSIS — N2581 Secondary hyperparathyroidism of renal origin: Secondary | ICD-10-CM | POA: Diagnosis not present

## 2022-02-11 DIAGNOSIS — N186 End stage renal disease: Secondary | ICD-10-CM | POA: Diagnosis not present

## 2022-02-11 DIAGNOSIS — Z992 Dependence on renal dialysis: Secondary | ICD-10-CM | POA: Diagnosis not present

## 2022-02-13 DIAGNOSIS — N186 End stage renal disease: Secondary | ICD-10-CM | POA: Diagnosis not present

## 2022-02-13 DIAGNOSIS — N2581 Secondary hyperparathyroidism of renal origin: Secondary | ICD-10-CM | POA: Diagnosis not present

## 2022-02-13 DIAGNOSIS — Z992 Dependence on renal dialysis: Secondary | ICD-10-CM | POA: Diagnosis not present

## 2022-02-16 DIAGNOSIS — N186 End stage renal disease: Secondary | ICD-10-CM | POA: Diagnosis not present

## 2022-02-16 DIAGNOSIS — N2581 Secondary hyperparathyroidism of renal origin: Secondary | ICD-10-CM | POA: Diagnosis not present

## 2022-02-16 DIAGNOSIS — Z992 Dependence on renal dialysis: Secondary | ICD-10-CM | POA: Diagnosis not present

## 2022-02-16 DIAGNOSIS — Z794 Long term (current) use of insulin: Secondary | ICD-10-CM | POA: Diagnosis not present

## 2022-02-16 DIAGNOSIS — E119 Type 2 diabetes mellitus without complications: Secondary | ICD-10-CM | POA: Diagnosis not present

## 2022-02-18 DIAGNOSIS — N186 End stage renal disease: Secondary | ICD-10-CM | POA: Diagnosis not present

## 2022-02-18 DIAGNOSIS — N2581 Secondary hyperparathyroidism of renal origin: Secondary | ICD-10-CM | POA: Diagnosis not present

## 2022-02-18 DIAGNOSIS — Z992 Dependence on renal dialysis: Secondary | ICD-10-CM | POA: Diagnosis not present

## 2022-02-20 DIAGNOSIS — N2581 Secondary hyperparathyroidism of renal origin: Secondary | ICD-10-CM | POA: Diagnosis not present

## 2022-02-20 DIAGNOSIS — N186 End stage renal disease: Secondary | ICD-10-CM | POA: Diagnosis not present

## 2022-02-20 DIAGNOSIS — Z992 Dependence on renal dialysis: Secondary | ICD-10-CM | POA: Diagnosis not present

## 2022-02-20 DIAGNOSIS — E1122 Type 2 diabetes mellitus with diabetic chronic kidney disease: Secondary | ICD-10-CM | POA: Diagnosis not present

## 2022-02-20 DIAGNOSIS — I129 Hypertensive chronic kidney disease with stage 1 through stage 4 chronic kidney disease, or unspecified chronic kidney disease: Secondary | ICD-10-CM | POA: Diagnosis not present

## 2022-02-20 DIAGNOSIS — N184 Chronic kidney disease, stage 4 (severe): Secondary | ICD-10-CM | POA: Diagnosis not present

## 2022-02-23 DIAGNOSIS — N250 Renal osteodystrophy: Secondary | ICD-10-CM | POA: Diagnosis not present

## 2022-02-23 DIAGNOSIS — Z992 Dependence on renal dialysis: Secondary | ICD-10-CM | POA: Diagnosis not present

## 2022-02-23 DIAGNOSIS — E559 Vitamin D deficiency, unspecified: Secondary | ICD-10-CM | POA: Diagnosis not present

## 2022-02-23 DIAGNOSIS — N2581 Secondary hyperparathyroidism of renal origin: Secondary | ICD-10-CM | POA: Diagnosis not present

## 2022-02-23 DIAGNOSIS — N186 End stage renal disease: Secondary | ICD-10-CM | POA: Diagnosis not present

## 2022-02-25 DIAGNOSIS — N2581 Secondary hyperparathyroidism of renal origin: Secondary | ICD-10-CM | POA: Diagnosis not present

## 2022-02-25 DIAGNOSIS — N186 End stage renal disease: Secondary | ICD-10-CM | POA: Diagnosis not present

## 2022-02-25 DIAGNOSIS — Z992 Dependence on renal dialysis: Secondary | ICD-10-CM | POA: Diagnosis not present

## 2022-02-25 DIAGNOSIS — E559 Vitamin D deficiency, unspecified: Secondary | ICD-10-CM | POA: Diagnosis not present

## 2022-02-25 DIAGNOSIS — N250 Renal osteodystrophy: Secondary | ICD-10-CM | POA: Diagnosis not present

## 2022-02-27 DIAGNOSIS — N186 End stage renal disease: Secondary | ICD-10-CM | POA: Diagnosis not present

## 2022-02-27 DIAGNOSIS — N2581 Secondary hyperparathyroidism of renal origin: Secondary | ICD-10-CM | POA: Diagnosis not present

## 2022-02-27 DIAGNOSIS — E559 Vitamin D deficiency, unspecified: Secondary | ICD-10-CM | POA: Diagnosis not present

## 2022-02-27 DIAGNOSIS — N250 Renal osteodystrophy: Secondary | ICD-10-CM | POA: Diagnosis not present

## 2022-02-27 DIAGNOSIS — Z992 Dependence on renal dialysis: Secondary | ICD-10-CM | POA: Diagnosis not present

## 2022-03-02 DIAGNOSIS — Z992 Dependence on renal dialysis: Secondary | ICD-10-CM | POA: Diagnosis not present

## 2022-03-02 DIAGNOSIS — N250 Renal osteodystrophy: Secondary | ICD-10-CM | POA: Diagnosis not present

## 2022-03-02 DIAGNOSIS — N2581 Secondary hyperparathyroidism of renal origin: Secondary | ICD-10-CM | POA: Diagnosis not present

## 2022-03-02 DIAGNOSIS — N186 End stage renal disease: Secondary | ICD-10-CM | POA: Diagnosis not present

## 2022-03-02 DIAGNOSIS — E559 Vitamin D deficiency, unspecified: Secondary | ICD-10-CM | POA: Diagnosis not present

## 2022-03-04 DIAGNOSIS — N186 End stage renal disease: Secondary | ICD-10-CM | POA: Diagnosis not present

## 2022-03-04 DIAGNOSIS — N250 Renal osteodystrophy: Secondary | ICD-10-CM | POA: Diagnosis not present

## 2022-03-04 DIAGNOSIS — E559 Vitamin D deficiency, unspecified: Secondary | ICD-10-CM | POA: Diagnosis not present

## 2022-03-04 DIAGNOSIS — N2581 Secondary hyperparathyroidism of renal origin: Secondary | ICD-10-CM | POA: Diagnosis not present

## 2022-03-04 DIAGNOSIS — Z992 Dependence on renal dialysis: Secondary | ICD-10-CM | POA: Diagnosis not present

## 2022-03-06 DIAGNOSIS — N2581 Secondary hyperparathyroidism of renal origin: Secondary | ICD-10-CM | POA: Diagnosis not present

## 2022-03-06 DIAGNOSIS — E559 Vitamin D deficiency, unspecified: Secondary | ICD-10-CM | POA: Diagnosis not present

## 2022-03-06 DIAGNOSIS — N250 Renal osteodystrophy: Secondary | ICD-10-CM | POA: Diagnosis not present

## 2022-03-06 DIAGNOSIS — Z992 Dependence on renal dialysis: Secondary | ICD-10-CM | POA: Diagnosis not present

## 2022-03-06 DIAGNOSIS — N186 End stage renal disease: Secondary | ICD-10-CM | POA: Diagnosis not present

## 2022-03-09 DIAGNOSIS — E559 Vitamin D deficiency, unspecified: Secondary | ICD-10-CM | POA: Diagnosis not present

## 2022-03-09 DIAGNOSIS — Z992 Dependence on renal dialysis: Secondary | ICD-10-CM | POA: Diagnosis not present

## 2022-03-09 DIAGNOSIS — N186 End stage renal disease: Secondary | ICD-10-CM | POA: Diagnosis not present

## 2022-03-09 DIAGNOSIS — N2581 Secondary hyperparathyroidism of renal origin: Secondary | ICD-10-CM | POA: Diagnosis not present

## 2022-03-09 DIAGNOSIS — N250 Renal osteodystrophy: Secondary | ICD-10-CM | POA: Diagnosis not present

## 2022-03-11 DIAGNOSIS — N2581 Secondary hyperparathyroidism of renal origin: Secondary | ICD-10-CM | POA: Diagnosis not present

## 2022-03-11 DIAGNOSIS — E559 Vitamin D deficiency, unspecified: Secondary | ICD-10-CM | POA: Diagnosis not present

## 2022-03-11 DIAGNOSIS — N250 Renal osteodystrophy: Secondary | ICD-10-CM | POA: Diagnosis not present

## 2022-03-11 DIAGNOSIS — N186 End stage renal disease: Secondary | ICD-10-CM | POA: Diagnosis not present

## 2022-03-11 DIAGNOSIS — Z992 Dependence on renal dialysis: Secondary | ICD-10-CM | POA: Diagnosis not present

## 2022-03-13 DIAGNOSIS — N186 End stage renal disease: Secondary | ICD-10-CM | POA: Diagnosis not present

## 2022-03-13 DIAGNOSIS — N250 Renal osteodystrophy: Secondary | ICD-10-CM | POA: Diagnosis not present

## 2022-03-13 DIAGNOSIS — E559 Vitamin D deficiency, unspecified: Secondary | ICD-10-CM | POA: Diagnosis not present

## 2022-03-13 DIAGNOSIS — Z992 Dependence on renal dialysis: Secondary | ICD-10-CM | POA: Diagnosis not present

## 2022-03-13 DIAGNOSIS — N2581 Secondary hyperparathyroidism of renal origin: Secondary | ICD-10-CM | POA: Diagnosis not present

## 2022-03-16 DIAGNOSIS — N186 End stage renal disease: Secondary | ICD-10-CM | POA: Diagnosis not present

## 2022-03-16 DIAGNOSIS — N250 Renal osteodystrophy: Secondary | ICD-10-CM | POA: Diagnosis not present

## 2022-03-16 DIAGNOSIS — E559 Vitamin D deficiency, unspecified: Secondary | ICD-10-CM | POA: Diagnosis not present

## 2022-03-16 DIAGNOSIS — Z992 Dependence on renal dialysis: Secondary | ICD-10-CM | POA: Diagnosis not present

## 2022-03-16 DIAGNOSIS — N2581 Secondary hyperparathyroidism of renal origin: Secondary | ICD-10-CM | POA: Diagnosis not present

## 2022-03-18 DIAGNOSIS — N2581 Secondary hyperparathyroidism of renal origin: Secondary | ICD-10-CM | POA: Diagnosis not present

## 2022-03-18 DIAGNOSIS — E559 Vitamin D deficiency, unspecified: Secondary | ICD-10-CM | POA: Diagnosis not present

## 2022-03-18 DIAGNOSIS — N186 End stage renal disease: Secondary | ICD-10-CM | POA: Diagnosis not present

## 2022-03-18 DIAGNOSIS — Z992 Dependence on renal dialysis: Secondary | ICD-10-CM | POA: Diagnosis not present

## 2022-03-18 DIAGNOSIS — N250 Renal osteodystrophy: Secondary | ICD-10-CM | POA: Diagnosis not present

## 2022-03-20 DIAGNOSIS — Z992 Dependence on renal dialysis: Secondary | ICD-10-CM | POA: Diagnosis not present

## 2022-03-20 DIAGNOSIS — N186 End stage renal disease: Secondary | ICD-10-CM | POA: Diagnosis not present

## 2022-03-20 DIAGNOSIS — N250 Renal osteodystrophy: Secondary | ICD-10-CM | POA: Diagnosis not present

## 2022-03-20 DIAGNOSIS — N2581 Secondary hyperparathyroidism of renal origin: Secondary | ICD-10-CM | POA: Diagnosis not present

## 2022-03-20 DIAGNOSIS — E559 Vitamin D deficiency, unspecified: Secondary | ICD-10-CM | POA: Diagnosis not present

## 2022-03-22 DIAGNOSIS — Z992 Dependence on renal dialysis: Secondary | ICD-10-CM | POA: Diagnosis not present

## 2022-03-22 DIAGNOSIS — N186 End stage renal disease: Secondary | ICD-10-CM | POA: Diagnosis not present

## 2022-03-23 DIAGNOSIS — N186 End stage renal disease: Secondary | ICD-10-CM | POA: Diagnosis not present

## 2022-03-23 DIAGNOSIS — E559 Vitamin D deficiency, unspecified: Secondary | ICD-10-CM | POA: Diagnosis not present

## 2022-03-23 DIAGNOSIS — N2581 Secondary hyperparathyroidism of renal origin: Secondary | ICD-10-CM | POA: Diagnosis not present

## 2022-03-23 DIAGNOSIS — Z992 Dependence on renal dialysis: Secondary | ICD-10-CM | POA: Diagnosis not present

## 2022-03-23 DIAGNOSIS — N250 Renal osteodystrophy: Secondary | ICD-10-CM | POA: Diagnosis not present

## 2022-03-25 DIAGNOSIS — Z992 Dependence on renal dialysis: Secondary | ICD-10-CM | POA: Diagnosis not present

## 2022-03-25 DIAGNOSIS — N186 End stage renal disease: Secondary | ICD-10-CM | POA: Diagnosis not present

## 2022-03-25 DIAGNOSIS — N250 Renal osteodystrophy: Secondary | ICD-10-CM | POA: Diagnosis not present

## 2022-03-25 DIAGNOSIS — E559 Vitamin D deficiency, unspecified: Secondary | ICD-10-CM | POA: Diagnosis not present

## 2022-03-25 DIAGNOSIS — N2581 Secondary hyperparathyroidism of renal origin: Secondary | ICD-10-CM | POA: Diagnosis not present

## 2022-03-27 DIAGNOSIS — N2581 Secondary hyperparathyroidism of renal origin: Secondary | ICD-10-CM | POA: Diagnosis not present

## 2022-03-27 DIAGNOSIS — E559 Vitamin D deficiency, unspecified: Secondary | ICD-10-CM | POA: Diagnosis not present

## 2022-03-27 DIAGNOSIS — N186 End stage renal disease: Secondary | ICD-10-CM | POA: Diagnosis not present

## 2022-03-27 DIAGNOSIS — Z992 Dependence on renal dialysis: Secondary | ICD-10-CM | POA: Diagnosis not present

## 2022-03-27 DIAGNOSIS — N250 Renal osteodystrophy: Secondary | ICD-10-CM | POA: Diagnosis not present

## 2022-03-30 DIAGNOSIS — N250 Renal osteodystrophy: Secondary | ICD-10-CM | POA: Diagnosis not present

## 2022-03-30 DIAGNOSIS — Z992 Dependence on renal dialysis: Secondary | ICD-10-CM | POA: Diagnosis not present

## 2022-03-30 DIAGNOSIS — N186 End stage renal disease: Secondary | ICD-10-CM | POA: Diagnosis not present

## 2022-03-30 DIAGNOSIS — N2581 Secondary hyperparathyroidism of renal origin: Secondary | ICD-10-CM | POA: Diagnosis not present

## 2022-03-30 DIAGNOSIS — E559 Vitamin D deficiency, unspecified: Secondary | ICD-10-CM | POA: Diagnosis not present

## 2022-04-01 DIAGNOSIS — E559 Vitamin D deficiency, unspecified: Secondary | ICD-10-CM | POA: Diagnosis not present

## 2022-04-01 DIAGNOSIS — N2581 Secondary hyperparathyroidism of renal origin: Secondary | ICD-10-CM | POA: Diagnosis not present

## 2022-04-01 DIAGNOSIS — N250 Renal osteodystrophy: Secondary | ICD-10-CM | POA: Diagnosis not present

## 2022-04-01 DIAGNOSIS — Z992 Dependence on renal dialysis: Secondary | ICD-10-CM | POA: Diagnosis not present

## 2022-04-01 DIAGNOSIS — N186 End stage renal disease: Secondary | ICD-10-CM | POA: Diagnosis not present

## 2022-04-03 DIAGNOSIS — Z992 Dependence on renal dialysis: Secondary | ICD-10-CM | POA: Diagnosis not present

## 2022-04-03 DIAGNOSIS — E559 Vitamin D deficiency, unspecified: Secondary | ICD-10-CM | POA: Diagnosis not present

## 2022-04-03 DIAGNOSIS — N250 Renal osteodystrophy: Secondary | ICD-10-CM | POA: Diagnosis not present

## 2022-04-03 DIAGNOSIS — N2581 Secondary hyperparathyroidism of renal origin: Secondary | ICD-10-CM | POA: Diagnosis not present

## 2022-04-03 DIAGNOSIS — N186 End stage renal disease: Secondary | ICD-10-CM | POA: Diagnosis not present

## 2022-04-06 DIAGNOSIS — N250 Renal osteodystrophy: Secondary | ICD-10-CM | POA: Diagnosis not present

## 2022-04-06 DIAGNOSIS — N2581 Secondary hyperparathyroidism of renal origin: Secondary | ICD-10-CM | POA: Diagnosis not present

## 2022-04-06 DIAGNOSIS — N186 End stage renal disease: Secondary | ICD-10-CM | POA: Diagnosis not present

## 2022-04-06 DIAGNOSIS — Z992 Dependence on renal dialysis: Secondary | ICD-10-CM | POA: Diagnosis not present

## 2022-04-06 DIAGNOSIS — E559 Vitamin D deficiency, unspecified: Secondary | ICD-10-CM | POA: Diagnosis not present

## 2022-04-08 DIAGNOSIS — N2581 Secondary hyperparathyroidism of renal origin: Secondary | ICD-10-CM | POA: Diagnosis not present

## 2022-04-08 DIAGNOSIS — N250 Renal osteodystrophy: Secondary | ICD-10-CM | POA: Diagnosis not present

## 2022-04-08 DIAGNOSIS — N186 End stage renal disease: Secondary | ICD-10-CM | POA: Diagnosis not present

## 2022-04-08 DIAGNOSIS — Z992 Dependence on renal dialysis: Secondary | ICD-10-CM | POA: Diagnosis not present

## 2022-04-08 DIAGNOSIS — E559 Vitamin D deficiency, unspecified: Secondary | ICD-10-CM | POA: Diagnosis not present

## 2022-04-10 DIAGNOSIS — N250 Renal osteodystrophy: Secondary | ICD-10-CM | POA: Diagnosis not present

## 2022-04-10 DIAGNOSIS — Z992 Dependence on renal dialysis: Secondary | ICD-10-CM | POA: Diagnosis not present

## 2022-04-10 DIAGNOSIS — N186 End stage renal disease: Secondary | ICD-10-CM | POA: Diagnosis not present

## 2022-04-10 DIAGNOSIS — E559 Vitamin D deficiency, unspecified: Secondary | ICD-10-CM | POA: Diagnosis not present

## 2022-04-10 DIAGNOSIS — N2581 Secondary hyperparathyroidism of renal origin: Secondary | ICD-10-CM | POA: Diagnosis not present

## 2022-04-13 DIAGNOSIS — Z992 Dependence on renal dialysis: Secondary | ICD-10-CM | POA: Diagnosis not present

## 2022-04-13 DIAGNOSIS — E559 Vitamin D deficiency, unspecified: Secondary | ICD-10-CM | POA: Diagnosis not present

## 2022-04-13 DIAGNOSIS — N2581 Secondary hyperparathyroidism of renal origin: Secondary | ICD-10-CM | POA: Diagnosis not present

## 2022-04-13 DIAGNOSIS — N250 Renal osteodystrophy: Secondary | ICD-10-CM | POA: Diagnosis not present

## 2022-04-13 DIAGNOSIS — N186 End stage renal disease: Secondary | ICD-10-CM | POA: Diagnosis not present

## 2022-04-15 DIAGNOSIS — N2581 Secondary hyperparathyroidism of renal origin: Secondary | ICD-10-CM | POA: Diagnosis not present

## 2022-04-15 DIAGNOSIS — N186 End stage renal disease: Secondary | ICD-10-CM | POA: Diagnosis not present

## 2022-04-15 DIAGNOSIS — E559 Vitamin D deficiency, unspecified: Secondary | ICD-10-CM | POA: Diagnosis not present

## 2022-04-15 DIAGNOSIS — N250 Renal osteodystrophy: Secondary | ICD-10-CM | POA: Diagnosis not present

## 2022-04-15 DIAGNOSIS — Z992 Dependence on renal dialysis: Secondary | ICD-10-CM | POA: Diagnosis not present

## 2022-04-17 DIAGNOSIS — Z992 Dependence on renal dialysis: Secondary | ICD-10-CM | POA: Diagnosis not present

## 2022-04-17 DIAGNOSIS — E559 Vitamin D deficiency, unspecified: Secondary | ICD-10-CM | POA: Diagnosis not present

## 2022-04-17 DIAGNOSIS — N250 Renal osteodystrophy: Secondary | ICD-10-CM | POA: Diagnosis not present

## 2022-04-17 DIAGNOSIS — N186 End stage renal disease: Secondary | ICD-10-CM | POA: Diagnosis not present

## 2022-04-17 DIAGNOSIS — N2581 Secondary hyperparathyroidism of renal origin: Secondary | ICD-10-CM | POA: Diagnosis not present

## 2022-04-20 DIAGNOSIS — Z992 Dependence on renal dialysis: Secondary | ICD-10-CM | POA: Diagnosis not present

## 2022-04-20 DIAGNOSIS — E559 Vitamin D deficiency, unspecified: Secondary | ICD-10-CM | POA: Diagnosis not present

## 2022-04-20 DIAGNOSIS — N250 Renal osteodystrophy: Secondary | ICD-10-CM | POA: Diagnosis not present

## 2022-04-20 DIAGNOSIS — N186 End stage renal disease: Secondary | ICD-10-CM | POA: Diagnosis not present

## 2022-04-20 DIAGNOSIS — N2581 Secondary hyperparathyroidism of renal origin: Secondary | ICD-10-CM | POA: Diagnosis not present

## 2022-04-22 DIAGNOSIS — N184 Chronic kidney disease, stage 4 (severe): Secondary | ICD-10-CM | POA: Diagnosis not present

## 2022-04-22 DIAGNOSIS — Z992 Dependence on renal dialysis: Secondary | ICD-10-CM | POA: Diagnosis not present

## 2022-04-22 DIAGNOSIS — E559 Vitamin D deficiency, unspecified: Secondary | ICD-10-CM | POA: Diagnosis not present

## 2022-04-22 DIAGNOSIS — N2581 Secondary hyperparathyroidism of renal origin: Secondary | ICD-10-CM | POA: Diagnosis not present

## 2022-04-22 DIAGNOSIS — N250 Renal osteodystrophy: Secondary | ICD-10-CM | POA: Diagnosis not present

## 2022-04-22 DIAGNOSIS — N186 End stage renal disease: Secondary | ICD-10-CM | POA: Diagnosis not present

## 2022-04-22 DIAGNOSIS — I129 Hypertensive chronic kidney disease with stage 1 through stage 4 chronic kidney disease, or unspecified chronic kidney disease: Secondary | ICD-10-CM | POA: Diagnosis not present

## 2022-04-22 DIAGNOSIS — E1122 Type 2 diabetes mellitus with diabetic chronic kidney disease: Secondary | ICD-10-CM | POA: Diagnosis not present

## 2022-04-24 DIAGNOSIS — N186 End stage renal disease: Secondary | ICD-10-CM | POA: Diagnosis not present

## 2022-04-24 DIAGNOSIS — N250 Renal osteodystrophy: Secondary | ICD-10-CM | POA: Diagnosis not present

## 2022-04-24 DIAGNOSIS — Z992 Dependence on renal dialysis: Secondary | ICD-10-CM | POA: Diagnosis not present

## 2022-04-24 DIAGNOSIS — N2581 Secondary hyperparathyroidism of renal origin: Secondary | ICD-10-CM | POA: Diagnosis not present

## 2022-04-24 DIAGNOSIS — E559 Vitamin D deficiency, unspecified: Secondary | ICD-10-CM | POA: Diagnosis not present

## 2022-04-27 DIAGNOSIS — N2581 Secondary hyperparathyroidism of renal origin: Secondary | ICD-10-CM | POA: Diagnosis not present

## 2022-04-27 DIAGNOSIS — N186 End stage renal disease: Secondary | ICD-10-CM | POA: Diagnosis not present

## 2022-04-27 DIAGNOSIS — Z992 Dependence on renal dialysis: Secondary | ICD-10-CM | POA: Diagnosis not present

## 2022-04-27 DIAGNOSIS — E559 Vitamin D deficiency, unspecified: Secondary | ICD-10-CM | POA: Diagnosis not present

## 2022-04-27 DIAGNOSIS — N250 Renal osteodystrophy: Secondary | ICD-10-CM | POA: Diagnosis not present

## 2022-04-29 DIAGNOSIS — Z992 Dependence on renal dialysis: Secondary | ICD-10-CM | POA: Diagnosis not present

## 2022-04-29 DIAGNOSIS — N2581 Secondary hyperparathyroidism of renal origin: Secondary | ICD-10-CM | POA: Diagnosis not present

## 2022-04-29 DIAGNOSIS — E559 Vitamin D deficiency, unspecified: Secondary | ICD-10-CM | POA: Diagnosis not present

## 2022-04-29 DIAGNOSIS — N250 Renal osteodystrophy: Secondary | ICD-10-CM | POA: Diagnosis not present

## 2022-04-29 DIAGNOSIS — N186 End stage renal disease: Secondary | ICD-10-CM | POA: Diagnosis not present

## 2022-05-01 DIAGNOSIS — N250 Renal osteodystrophy: Secondary | ICD-10-CM | POA: Diagnosis not present

## 2022-05-01 DIAGNOSIS — E559 Vitamin D deficiency, unspecified: Secondary | ICD-10-CM | POA: Diagnosis not present

## 2022-05-01 DIAGNOSIS — N186 End stage renal disease: Secondary | ICD-10-CM | POA: Diagnosis not present

## 2022-05-01 DIAGNOSIS — Z992 Dependence on renal dialysis: Secondary | ICD-10-CM | POA: Diagnosis not present

## 2022-05-01 DIAGNOSIS — N2581 Secondary hyperparathyroidism of renal origin: Secondary | ICD-10-CM | POA: Diagnosis not present

## 2022-05-04 DIAGNOSIS — E559 Vitamin D deficiency, unspecified: Secondary | ICD-10-CM | POA: Diagnosis not present

## 2022-05-04 DIAGNOSIS — N2581 Secondary hyperparathyroidism of renal origin: Secondary | ICD-10-CM | POA: Diagnosis not present

## 2022-05-04 DIAGNOSIS — Z992 Dependence on renal dialysis: Secondary | ICD-10-CM | POA: Diagnosis not present

## 2022-05-04 DIAGNOSIS — N250 Renal osteodystrophy: Secondary | ICD-10-CM | POA: Diagnosis not present

## 2022-05-04 DIAGNOSIS — N186 End stage renal disease: Secondary | ICD-10-CM | POA: Diagnosis not present

## 2022-05-06 DIAGNOSIS — N2581 Secondary hyperparathyroidism of renal origin: Secondary | ICD-10-CM | POA: Diagnosis not present

## 2022-05-06 DIAGNOSIS — Z992 Dependence on renal dialysis: Secondary | ICD-10-CM | POA: Diagnosis not present

## 2022-05-06 DIAGNOSIS — N186 End stage renal disease: Secondary | ICD-10-CM | POA: Diagnosis not present

## 2022-05-06 DIAGNOSIS — E559 Vitamin D deficiency, unspecified: Secondary | ICD-10-CM | POA: Diagnosis not present

## 2022-05-06 DIAGNOSIS — N250 Renal osteodystrophy: Secondary | ICD-10-CM | POA: Diagnosis not present

## 2022-05-08 DIAGNOSIS — N186 End stage renal disease: Secondary | ICD-10-CM | POA: Diagnosis not present

## 2022-05-08 DIAGNOSIS — N2581 Secondary hyperparathyroidism of renal origin: Secondary | ICD-10-CM | POA: Diagnosis not present

## 2022-05-08 DIAGNOSIS — N250 Renal osteodystrophy: Secondary | ICD-10-CM | POA: Diagnosis not present

## 2022-05-08 DIAGNOSIS — E559 Vitamin D deficiency, unspecified: Secondary | ICD-10-CM | POA: Diagnosis not present

## 2022-05-08 DIAGNOSIS — Z992 Dependence on renal dialysis: Secondary | ICD-10-CM | POA: Diagnosis not present

## 2022-05-11 DIAGNOSIS — E559 Vitamin D deficiency, unspecified: Secondary | ICD-10-CM | POA: Diagnosis not present

## 2022-05-11 DIAGNOSIS — N186 End stage renal disease: Secondary | ICD-10-CM | POA: Diagnosis not present

## 2022-05-11 DIAGNOSIS — Z992 Dependence on renal dialysis: Secondary | ICD-10-CM | POA: Diagnosis not present

## 2022-05-11 DIAGNOSIS — N2581 Secondary hyperparathyroidism of renal origin: Secondary | ICD-10-CM | POA: Diagnosis not present

## 2022-05-11 DIAGNOSIS — N250 Renal osteodystrophy: Secondary | ICD-10-CM | POA: Diagnosis not present

## 2022-05-13 DIAGNOSIS — E559 Vitamin D deficiency, unspecified: Secondary | ICD-10-CM | POA: Diagnosis not present

## 2022-05-13 DIAGNOSIS — N250 Renal osteodystrophy: Secondary | ICD-10-CM | POA: Diagnosis not present

## 2022-05-13 DIAGNOSIS — N2581 Secondary hyperparathyroidism of renal origin: Secondary | ICD-10-CM | POA: Diagnosis not present

## 2022-05-13 DIAGNOSIS — Z992 Dependence on renal dialysis: Secondary | ICD-10-CM | POA: Diagnosis not present

## 2022-05-13 DIAGNOSIS — N186 End stage renal disease: Secondary | ICD-10-CM | POA: Diagnosis not present

## 2022-05-15 DIAGNOSIS — N186 End stage renal disease: Secondary | ICD-10-CM | POA: Diagnosis not present

## 2022-05-15 DIAGNOSIS — E559 Vitamin D deficiency, unspecified: Secondary | ICD-10-CM | POA: Diagnosis not present

## 2022-05-15 DIAGNOSIS — Z992 Dependence on renal dialysis: Secondary | ICD-10-CM | POA: Diagnosis not present

## 2022-05-15 DIAGNOSIS — N250 Renal osteodystrophy: Secondary | ICD-10-CM | POA: Diagnosis not present

## 2022-05-15 DIAGNOSIS — N2581 Secondary hyperparathyroidism of renal origin: Secondary | ICD-10-CM | POA: Diagnosis not present

## 2022-05-18 DIAGNOSIS — N250 Renal osteodystrophy: Secondary | ICD-10-CM | POA: Diagnosis not present

## 2022-05-18 DIAGNOSIS — E559 Vitamin D deficiency, unspecified: Secondary | ICD-10-CM | POA: Diagnosis not present

## 2022-05-18 DIAGNOSIS — N2581 Secondary hyperparathyroidism of renal origin: Secondary | ICD-10-CM | POA: Diagnosis not present

## 2022-05-18 DIAGNOSIS — Z992 Dependence on renal dialysis: Secondary | ICD-10-CM | POA: Diagnosis not present

## 2022-05-18 DIAGNOSIS — N186 End stage renal disease: Secondary | ICD-10-CM | POA: Diagnosis not present

## 2022-05-20 DIAGNOSIS — N2581 Secondary hyperparathyroidism of renal origin: Secondary | ICD-10-CM | POA: Diagnosis not present

## 2022-05-20 DIAGNOSIS — N186 End stage renal disease: Secondary | ICD-10-CM | POA: Diagnosis not present

## 2022-05-20 DIAGNOSIS — N250 Renal osteodystrophy: Secondary | ICD-10-CM | POA: Diagnosis not present

## 2022-05-20 DIAGNOSIS — Z992 Dependence on renal dialysis: Secondary | ICD-10-CM | POA: Diagnosis not present

## 2022-05-20 DIAGNOSIS — E559 Vitamin D deficiency, unspecified: Secondary | ICD-10-CM | POA: Diagnosis not present

## 2022-05-22 DIAGNOSIS — E1122 Type 2 diabetes mellitus with diabetic chronic kidney disease: Secondary | ICD-10-CM | POA: Diagnosis not present

## 2022-05-22 DIAGNOSIS — N2581 Secondary hyperparathyroidism of renal origin: Secondary | ICD-10-CM | POA: Diagnosis not present

## 2022-05-22 DIAGNOSIS — N250 Renal osteodystrophy: Secondary | ICD-10-CM | POA: Diagnosis not present

## 2022-05-22 DIAGNOSIS — E559 Vitamin D deficiency, unspecified: Secondary | ICD-10-CM | POA: Diagnosis not present

## 2022-05-22 DIAGNOSIS — N186 End stage renal disease: Secondary | ICD-10-CM | POA: Diagnosis not present

## 2022-05-22 DIAGNOSIS — N184 Chronic kidney disease, stage 4 (severe): Secondary | ICD-10-CM | POA: Diagnosis not present

## 2022-05-22 DIAGNOSIS — Z992 Dependence on renal dialysis: Secondary | ICD-10-CM | POA: Diagnosis not present

## 2022-05-25 DIAGNOSIS — N2581 Secondary hyperparathyroidism of renal origin: Secondary | ICD-10-CM | POA: Diagnosis not present

## 2022-05-25 DIAGNOSIS — N250 Renal osteodystrophy: Secondary | ICD-10-CM | POA: Diagnosis not present

## 2022-05-25 DIAGNOSIS — N186 End stage renal disease: Secondary | ICD-10-CM | POA: Diagnosis not present

## 2022-05-25 DIAGNOSIS — Z992 Dependence on renal dialysis: Secondary | ICD-10-CM | POA: Diagnosis not present

## 2022-05-25 DIAGNOSIS — E559 Vitamin D deficiency, unspecified: Secondary | ICD-10-CM | POA: Diagnosis not present

## 2022-05-27 DIAGNOSIS — N250 Renal osteodystrophy: Secondary | ICD-10-CM | POA: Diagnosis not present

## 2022-05-27 DIAGNOSIS — E559 Vitamin D deficiency, unspecified: Secondary | ICD-10-CM | POA: Diagnosis not present

## 2022-05-27 DIAGNOSIS — N2581 Secondary hyperparathyroidism of renal origin: Secondary | ICD-10-CM | POA: Diagnosis not present

## 2022-05-27 DIAGNOSIS — N186 End stage renal disease: Secondary | ICD-10-CM | POA: Diagnosis not present

## 2022-05-27 DIAGNOSIS — Z992 Dependence on renal dialysis: Secondary | ICD-10-CM | POA: Diagnosis not present

## 2022-05-29 DIAGNOSIS — Z992 Dependence on renal dialysis: Secondary | ICD-10-CM | POA: Diagnosis not present

## 2022-05-29 DIAGNOSIS — N250 Renal osteodystrophy: Secondary | ICD-10-CM | POA: Diagnosis not present

## 2022-05-29 DIAGNOSIS — N186 End stage renal disease: Secondary | ICD-10-CM | POA: Diagnosis not present

## 2022-05-29 DIAGNOSIS — N2581 Secondary hyperparathyroidism of renal origin: Secondary | ICD-10-CM | POA: Diagnosis not present

## 2022-05-29 DIAGNOSIS — E559 Vitamin D deficiency, unspecified: Secondary | ICD-10-CM | POA: Diagnosis not present

## 2022-06-01 DIAGNOSIS — N2581 Secondary hyperparathyroidism of renal origin: Secondary | ICD-10-CM | POA: Diagnosis not present

## 2022-06-01 DIAGNOSIS — Z992 Dependence on renal dialysis: Secondary | ICD-10-CM | POA: Diagnosis not present

## 2022-06-01 DIAGNOSIS — E559 Vitamin D deficiency, unspecified: Secondary | ICD-10-CM | POA: Diagnosis not present

## 2022-06-01 DIAGNOSIS — N186 End stage renal disease: Secondary | ICD-10-CM | POA: Diagnosis not present

## 2022-06-01 DIAGNOSIS — N250 Renal osteodystrophy: Secondary | ICD-10-CM | POA: Diagnosis not present

## 2022-06-03 DIAGNOSIS — E559 Vitamin D deficiency, unspecified: Secondary | ICD-10-CM | POA: Diagnosis not present

## 2022-06-03 DIAGNOSIS — N250 Renal osteodystrophy: Secondary | ICD-10-CM | POA: Diagnosis not present

## 2022-06-03 DIAGNOSIS — N186 End stage renal disease: Secondary | ICD-10-CM | POA: Diagnosis not present

## 2022-06-03 DIAGNOSIS — Z992 Dependence on renal dialysis: Secondary | ICD-10-CM | POA: Diagnosis not present

## 2022-06-03 DIAGNOSIS — N2581 Secondary hyperparathyroidism of renal origin: Secondary | ICD-10-CM | POA: Diagnosis not present

## 2022-06-05 DIAGNOSIS — E559 Vitamin D deficiency, unspecified: Secondary | ICD-10-CM | POA: Diagnosis not present

## 2022-06-05 DIAGNOSIS — N2581 Secondary hyperparathyroidism of renal origin: Secondary | ICD-10-CM | POA: Diagnosis not present

## 2022-06-05 DIAGNOSIS — N250 Renal osteodystrophy: Secondary | ICD-10-CM | POA: Diagnosis not present

## 2022-06-05 DIAGNOSIS — Z992 Dependence on renal dialysis: Secondary | ICD-10-CM | POA: Diagnosis not present

## 2022-06-05 DIAGNOSIS — N186 End stage renal disease: Secondary | ICD-10-CM | POA: Diagnosis not present

## 2022-06-08 DIAGNOSIS — N186 End stage renal disease: Secondary | ICD-10-CM | POA: Diagnosis not present

## 2022-06-08 DIAGNOSIS — Z992 Dependence on renal dialysis: Secondary | ICD-10-CM | POA: Diagnosis not present

## 2022-06-08 DIAGNOSIS — E559 Vitamin D deficiency, unspecified: Secondary | ICD-10-CM | POA: Diagnosis not present

## 2022-06-08 DIAGNOSIS — N2581 Secondary hyperparathyroidism of renal origin: Secondary | ICD-10-CM | POA: Diagnosis not present

## 2022-06-08 DIAGNOSIS — N250 Renal osteodystrophy: Secondary | ICD-10-CM | POA: Diagnosis not present

## 2022-06-10 DIAGNOSIS — E559 Vitamin D deficiency, unspecified: Secondary | ICD-10-CM | POA: Diagnosis not present

## 2022-06-10 DIAGNOSIS — N186 End stage renal disease: Secondary | ICD-10-CM | POA: Diagnosis not present

## 2022-06-10 DIAGNOSIS — Z992 Dependence on renal dialysis: Secondary | ICD-10-CM | POA: Diagnosis not present

## 2022-06-10 DIAGNOSIS — N2581 Secondary hyperparathyroidism of renal origin: Secondary | ICD-10-CM | POA: Diagnosis not present

## 2022-06-10 DIAGNOSIS — N250 Renal osteodystrophy: Secondary | ICD-10-CM | POA: Diagnosis not present

## 2022-06-12 DIAGNOSIS — N186 End stage renal disease: Secondary | ICD-10-CM | POA: Diagnosis not present

## 2022-06-12 DIAGNOSIS — Z992 Dependence on renal dialysis: Secondary | ICD-10-CM | POA: Diagnosis not present

## 2022-06-12 DIAGNOSIS — N2581 Secondary hyperparathyroidism of renal origin: Secondary | ICD-10-CM | POA: Diagnosis not present

## 2022-06-12 DIAGNOSIS — E559 Vitamin D deficiency, unspecified: Secondary | ICD-10-CM | POA: Diagnosis not present

## 2022-06-12 DIAGNOSIS — N250 Renal osteodystrophy: Secondary | ICD-10-CM | POA: Diagnosis not present

## 2022-06-15 DIAGNOSIS — Z992 Dependence on renal dialysis: Secondary | ICD-10-CM | POA: Diagnosis not present

## 2022-06-15 DIAGNOSIS — N250 Renal osteodystrophy: Secondary | ICD-10-CM | POA: Diagnosis not present

## 2022-06-15 DIAGNOSIS — E559 Vitamin D deficiency, unspecified: Secondary | ICD-10-CM | POA: Diagnosis not present

## 2022-06-15 DIAGNOSIS — N186 End stage renal disease: Secondary | ICD-10-CM | POA: Diagnosis not present

## 2022-06-15 DIAGNOSIS — N2581 Secondary hyperparathyroidism of renal origin: Secondary | ICD-10-CM | POA: Diagnosis not present

## 2022-06-17 DIAGNOSIS — N186 End stage renal disease: Secondary | ICD-10-CM | POA: Diagnosis not present

## 2022-06-17 DIAGNOSIS — E559 Vitamin D deficiency, unspecified: Secondary | ICD-10-CM | POA: Diagnosis not present

## 2022-06-17 DIAGNOSIS — Z992 Dependence on renal dialysis: Secondary | ICD-10-CM | POA: Diagnosis not present

## 2022-06-17 DIAGNOSIS — N250 Renal osteodystrophy: Secondary | ICD-10-CM | POA: Diagnosis not present

## 2022-06-17 DIAGNOSIS — N2581 Secondary hyperparathyroidism of renal origin: Secondary | ICD-10-CM | POA: Diagnosis not present

## 2022-06-19 DIAGNOSIS — N250 Renal osteodystrophy: Secondary | ICD-10-CM | POA: Diagnosis not present

## 2022-06-19 DIAGNOSIS — Z992 Dependence on renal dialysis: Secondary | ICD-10-CM | POA: Diagnosis not present

## 2022-06-19 DIAGNOSIS — N2581 Secondary hyperparathyroidism of renal origin: Secondary | ICD-10-CM | POA: Diagnosis not present

## 2022-06-19 DIAGNOSIS — E559 Vitamin D deficiency, unspecified: Secondary | ICD-10-CM | POA: Diagnosis not present

## 2022-06-19 DIAGNOSIS — N186 End stage renal disease: Secondary | ICD-10-CM | POA: Diagnosis not present

## 2022-06-22 DIAGNOSIS — N186 End stage renal disease: Secondary | ICD-10-CM | POA: Diagnosis not present

## 2022-06-22 DIAGNOSIS — E559 Vitamin D deficiency, unspecified: Secondary | ICD-10-CM | POA: Diagnosis not present

## 2022-06-22 DIAGNOSIS — N2581 Secondary hyperparathyroidism of renal origin: Secondary | ICD-10-CM | POA: Diagnosis not present

## 2022-06-22 DIAGNOSIS — Z992 Dependence on renal dialysis: Secondary | ICD-10-CM | POA: Diagnosis not present

## 2022-06-22 DIAGNOSIS — N250 Renal osteodystrophy: Secondary | ICD-10-CM | POA: Diagnosis not present

## 2022-06-24 DIAGNOSIS — N186 End stage renal disease: Secondary | ICD-10-CM | POA: Diagnosis not present

## 2022-06-24 DIAGNOSIS — Z992 Dependence on renal dialysis: Secondary | ICD-10-CM | POA: Diagnosis not present

## 2022-06-26 DIAGNOSIS — Z992 Dependence on renal dialysis: Secondary | ICD-10-CM | POA: Diagnosis not present

## 2022-06-26 DIAGNOSIS — N186 End stage renal disease: Secondary | ICD-10-CM | POA: Diagnosis not present

## 2022-06-29 DIAGNOSIS — N186 End stage renal disease: Secondary | ICD-10-CM | POA: Diagnosis not present

## 2022-06-29 DIAGNOSIS — Z992 Dependence on renal dialysis: Secondary | ICD-10-CM | POA: Diagnosis not present

## 2022-07-01 DIAGNOSIS — E7801 Familial hypercholesterolemia: Secondary | ICD-10-CM | POA: Diagnosis not present

## 2022-07-01 DIAGNOSIS — E7800 Pure hypercholesterolemia, unspecified: Secondary | ICD-10-CM | POA: Diagnosis not present

## 2022-07-01 DIAGNOSIS — K2100 Gastro-esophageal reflux disease with esophagitis, without bleeding: Secondary | ICD-10-CM | POA: Diagnosis not present

## 2022-07-01 DIAGNOSIS — N184 Chronic kidney disease, stage 4 (severe): Secondary | ICD-10-CM | POA: Diagnosis not present

## 2022-07-01 DIAGNOSIS — Z992 Dependence on renal dialysis: Secondary | ICD-10-CM | POA: Diagnosis not present

## 2022-07-01 DIAGNOSIS — N186 End stage renal disease: Secondary | ICD-10-CM | POA: Diagnosis not present

## 2022-07-01 DIAGNOSIS — I6930 Unspecified sequelae of cerebral infarction: Secondary | ICD-10-CM | POA: Diagnosis not present

## 2022-07-01 DIAGNOSIS — E1122 Type 2 diabetes mellitus with diabetic chronic kidney disease: Secondary | ICD-10-CM | POA: Diagnosis not present

## 2022-07-01 DIAGNOSIS — I10 Essential (primary) hypertension: Secondary | ICD-10-CM | POA: Diagnosis not present

## 2022-07-01 DIAGNOSIS — E1165 Type 2 diabetes mellitus with hyperglycemia: Secondary | ICD-10-CM | POA: Diagnosis not present

## 2022-07-03 DIAGNOSIS — N186 End stage renal disease: Secondary | ICD-10-CM | POA: Diagnosis not present

## 2022-07-03 DIAGNOSIS — Z992 Dependence on renal dialysis: Secondary | ICD-10-CM | POA: Diagnosis not present

## 2022-07-06 DIAGNOSIS — Z992 Dependence on renal dialysis: Secondary | ICD-10-CM | POA: Diagnosis not present

## 2022-07-06 DIAGNOSIS — N186 End stage renal disease: Secondary | ICD-10-CM | POA: Diagnosis not present

## 2022-07-08 DIAGNOSIS — N186 End stage renal disease: Secondary | ICD-10-CM | POA: Diagnosis not present

## 2022-07-08 DIAGNOSIS — Z992 Dependence on renal dialysis: Secondary | ICD-10-CM | POA: Diagnosis not present

## 2022-07-10 DIAGNOSIS — Z992 Dependence on renal dialysis: Secondary | ICD-10-CM | POA: Diagnosis not present

## 2022-07-10 DIAGNOSIS — N186 End stage renal disease: Secondary | ICD-10-CM | POA: Diagnosis not present

## 2022-07-13 DIAGNOSIS — Z992 Dependence on renal dialysis: Secondary | ICD-10-CM | POA: Diagnosis not present

## 2022-07-13 DIAGNOSIS — N186 End stage renal disease: Secondary | ICD-10-CM | POA: Diagnosis not present

## 2022-07-15 DIAGNOSIS — Z992 Dependence on renal dialysis: Secondary | ICD-10-CM | POA: Diagnosis not present

## 2022-07-15 DIAGNOSIS — N186 End stage renal disease: Secondary | ICD-10-CM | POA: Diagnosis not present

## 2022-07-17 DIAGNOSIS — N186 End stage renal disease: Secondary | ICD-10-CM | POA: Diagnosis not present

## 2022-07-17 DIAGNOSIS — Z992 Dependence on renal dialysis: Secondary | ICD-10-CM | POA: Diagnosis not present

## 2022-07-20 DIAGNOSIS — N186 End stage renal disease: Secondary | ICD-10-CM | POA: Diagnosis not present

## 2022-07-20 DIAGNOSIS — Z992 Dependence on renal dialysis: Secondary | ICD-10-CM | POA: Diagnosis not present

## 2022-07-22 DIAGNOSIS — N186 End stage renal disease: Secondary | ICD-10-CM | POA: Diagnosis not present

## 2022-07-22 DIAGNOSIS — Z992 Dependence on renal dialysis: Secondary | ICD-10-CM | POA: Diagnosis not present

## 2022-07-23 DIAGNOSIS — Z992 Dependence on renal dialysis: Secondary | ICD-10-CM | POA: Diagnosis not present

## 2022-07-23 DIAGNOSIS — N186 End stage renal disease: Secondary | ICD-10-CM | POA: Diagnosis not present

## 2022-07-24 DIAGNOSIS — Z992 Dependence on renal dialysis: Secondary | ICD-10-CM | POA: Diagnosis not present

## 2022-07-24 DIAGNOSIS — N186 End stage renal disease: Secondary | ICD-10-CM | POA: Diagnosis not present

## 2022-07-24 DIAGNOSIS — N2581 Secondary hyperparathyroidism of renal origin: Secondary | ICD-10-CM | POA: Diagnosis not present

## 2022-07-27 DIAGNOSIS — Z992 Dependence on renal dialysis: Secondary | ICD-10-CM | POA: Diagnosis not present

## 2022-07-27 DIAGNOSIS — N186 End stage renal disease: Secondary | ICD-10-CM | POA: Diagnosis not present

## 2022-07-27 DIAGNOSIS — N2581 Secondary hyperparathyroidism of renal origin: Secondary | ICD-10-CM | POA: Diagnosis not present

## 2022-07-29 DIAGNOSIS — Z992 Dependence on renal dialysis: Secondary | ICD-10-CM | POA: Diagnosis not present

## 2022-07-29 DIAGNOSIS — N186 End stage renal disease: Secondary | ICD-10-CM | POA: Diagnosis not present

## 2022-07-29 DIAGNOSIS — N2581 Secondary hyperparathyroidism of renal origin: Secondary | ICD-10-CM | POA: Diagnosis not present

## 2022-07-31 DIAGNOSIS — N2581 Secondary hyperparathyroidism of renal origin: Secondary | ICD-10-CM | POA: Diagnosis not present

## 2022-07-31 DIAGNOSIS — Z992 Dependence on renal dialysis: Secondary | ICD-10-CM | POA: Diagnosis not present

## 2022-07-31 DIAGNOSIS — N186 End stage renal disease: Secondary | ICD-10-CM | POA: Diagnosis not present

## 2022-08-03 DIAGNOSIS — N2581 Secondary hyperparathyroidism of renal origin: Secondary | ICD-10-CM | POA: Diagnosis not present

## 2022-08-03 DIAGNOSIS — Z992 Dependence on renal dialysis: Secondary | ICD-10-CM | POA: Diagnosis not present

## 2022-08-03 DIAGNOSIS — N186 End stage renal disease: Secondary | ICD-10-CM | POA: Diagnosis not present

## 2022-08-05 DIAGNOSIS — Z992 Dependence on renal dialysis: Secondary | ICD-10-CM | POA: Diagnosis not present

## 2022-08-05 DIAGNOSIS — N2581 Secondary hyperparathyroidism of renal origin: Secondary | ICD-10-CM | POA: Diagnosis not present

## 2022-08-05 DIAGNOSIS — N186 End stage renal disease: Secondary | ICD-10-CM | POA: Diagnosis not present

## 2022-08-07 DIAGNOSIS — N186 End stage renal disease: Secondary | ICD-10-CM | POA: Diagnosis not present

## 2022-08-07 DIAGNOSIS — N2581 Secondary hyperparathyroidism of renal origin: Secondary | ICD-10-CM | POA: Diagnosis not present

## 2022-08-07 DIAGNOSIS — Z992 Dependence on renal dialysis: Secondary | ICD-10-CM | POA: Diagnosis not present

## 2022-08-10 DIAGNOSIS — N2581 Secondary hyperparathyroidism of renal origin: Secondary | ICD-10-CM | POA: Diagnosis not present

## 2022-08-10 DIAGNOSIS — Z992 Dependence on renal dialysis: Secondary | ICD-10-CM | POA: Diagnosis not present

## 2022-08-10 DIAGNOSIS — N186 End stage renal disease: Secondary | ICD-10-CM | POA: Diagnosis not present

## 2022-08-12 DIAGNOSIS — S0990XA Unspecified injury of head, initial encounter: Secondary | ICD-10-CM | POA: Diagnosis not present

## 2022-08-12 DIAGNOSIS — S60219A Contusion of unspecified wrist, initial encounter: Secondary | ICD-10-CM | POA: Diagnosis not present

## 2022-08-12 DIAGNOSIS — I6782 Cerebral ischemia: Secondary | ICD-10-CM | POA: Diagnosis not present

## 2022-08-12 DIAGNOSIS — S8001XA Contusion of right knee, initial encounter: Secondary | ICD-10-CM | POA: Diagnosis not present

## 2022-08-12 DIAGNOSIS — R102 Pelvic and perineal pain: Secondary | ICD-10-CM | POA: Diagnosis not present

## 2022-08-12 DIAGNOSIS — W1839XA Other fall on same level, initial encounter: Secondary | ICD-10-CM | POA: Diagnosis not present

## 2022-08-12 DIAGNOSIS — E119 Type 2 diabetes mellitus without complications: Secondary | ICD-10-CM | POA: Diagnosis not present

## 2022-08-12 DIAGNOSIS — M25531 Pain in right wrist: Secondary | ICD-10-CM | POA: Diagnosis not present

## 2022-08-12 DIAGNOSIS — M25532 Pain in left wrist: Secondary | ICD-10-CM | POA: Diagnosis not present

## 2022-08-12 DIAGNOSIS — S60211A Contusion of right wrist, initial encounter: Secondary | ICD-10-CM | POA: Diagnosis not present

## 2022-08-12 DIAGNOSIS — M25561 Pain in right knee: Secondary | ICD-10-CM | POA: Diagnosis not present

## 2022-08-12 DIAGNOSIS — I10 Essential (primary) hypertension: Secondary | ICD-10-CM | POA: Diagnosis not present

## 2022-08-14 DIAGNOSIS — Z992 Dependence on renal dialysis: Secondary | ICD-10-CM | POA: Diagnosis not present

## 2022-08-14 DIAGNOSIS — N186 End stage renal disease: Secondary | ICD-10-CM | POA: Diagnosis not present

## 2022-08-14 DIAGNOSIS — N2581 Secondary hyperparathyroidism of renal origin: Secondary | ICD-10-CM | POA: Diagnosis not present

## 2022-08-17 DIAGNOSIS — N186 End stage renal disease: Secondary | ICD-10-CM | POA: Diagnosis not present

## 2022-08-17 DIAGNOSIS — Z992 Dependence on renal dialysis: Secondary | ICD-10-CM | POA: Diagnosis not present

## 2022-08-17 DIAGNOSIS — N2581 Secondary hyperparathyroidism of renal origin: Secondary | ICD-10-CM | POA: Diagnosis not present

## 2022-08-19 DIAGNOSIS — Z992 Dependence on renal dialysis: Secondary | ICD-10-CM | POA: Diagnosis not present

## 2022-08-19 DIAGNOSIS — N186 End stage renal disease: Secondary | ICD-10-CM | POA: Diagnosis not present

## 2022-08-19 DIAGNOSIS — N2581 Secondary hyperparathyroidism of renal origin: Secondary | ICD-10-CM | POA: Diagnosis not present

## 2022-08-21 DIAGNOSIS — N186 End stage renal disease: Secondary | ICD-10-CM | POA: Diagnosis not present

## 2022-08-21 DIAGNOSIS — Z992 Dependence on renal dialysis: Secondary | ICD-10-CM | POA: Diagnosis not present

## 2022-08-21 DIAGNOSIS — N2581 Secondary hyperparathyroidism of renal origin: Secondary | ICD-10-CM | POA: Diagnosis not present

## 2022-08-22 DIAGNOSIS — I10 Essential (primary) hypertension: Secondary | ICD-10-CM | POA: Diagnosis not present

## 2022-08-22 DIAGNOSIS — K219 Gastro-esophageal reflux disease without esophagitis: Secondary | ICD-10-CM | POA: Diagnosis not present

## 2022-08-22 DIAGNOSIS — E1165 Type 2 diabetes mellitus with hyperglycemia: Secondary | ICD-10-CM | POA: Diagnosis not present

## 2022-08-22 DIAGNOSIS — Z992 Dependence on renal dialysis: Secondary | ICD-10-CM | POA: Diagnosis not present

## 2022-08-22 DIAGNOSIS — N186 End stage renal disease: Secondary | ICD-10-CM | POA: Diagnosis not present

## 2022-08-24 DIAGNOSIS — Z992 Dependence on renal dialysis: Secondary | ICD-10-CM | POA: Diagnosis not present

## 2022-08-24 DIAGNOSIS — N2581 Secondary hyperparathyroidism of renal origin: Secondary | ICD-10-CM | POA: Diagnosis not present

## 2022-08-24 DIAGNOSIS — N186 End stage renal disease: Secondary | ICD-10-CM | POA: Diagnosis not present

## 2022-08-26 DIAGNOSIS — Z992 Dependence on renal dialysis: Secondary | ICD-10-CM | POA: Diagnosis not present

## 2022-08-26 DIAGNOSIS — N186 End stage renal disease: Secondary | ICD-10-CM | POA: Diagnosis not present

## 2022-08-26 DIAGNOSIS — N2581 Secondary hyperparathyroidism of renal origin: Secondary | ICD-10-CM | POA: Diagnosis not present

## 2022-08-28 DIAGNOSIS — E1165 Type 2 diabetes mellitus with hyperglycemia: Secondary | ICD-10-CM | POA: Diagnosis not present

## 2022-08-28 DIAGNOSIS — N186 End stage renal disease: Secondary | ICD-10-CM | POA: Diagnosis not present

## 2022-08-28 DIAGNOSIS — Z0001 Encounter for general adult medical examination with abnormal findings: Secondary | ICD-10-CM | POA: Diagnosis not present

## 2022-08-28 DIAGNOSIS — I059 Rheumatic mitral valve disease, unspecified: Secondary | ICD-10-CM | POA: Diagnosis not present

## 2022-08-28 DIAGNOSIS — R809 Proteinuria, unspecified: Secondary | ICD-10-CM | POA: Diagnosis not present

## 2022-08-28 DIAGNOSIS — N2581 Secondary hyperparathyroidism of renal origin: Secondary | ICD-10-CM | POA: Diagnosis not present

## 2022-08-28 DIAGNOSIS — E1122 Type 2 diabetes mellitus with diabetic chronic kidney disease: Secondary | ICD-10-CM | POA: Diagnosis not present

## 2022-08-28 DIAGNOSIS — Z992 Dependence on renal dialysis: Secondary | ICD-10-CM | POA: Diagnosis not present

## 2022-08-28 DIAGNOSIS — I43 Cardiomyopathy in diseases classified elsewhere: Secondary | ICD-10-CM | POA: Diagnosis not present

## 2022-08-28 DIAGNOSIS — I10 Essential (primary) hypertension: Secondary | ICD-10-CM | POA: Diagnosis not present

## 2022-08-28 DIAGNOSIS — E1129 Type 2 diabetes mellitus with other diabetic kidney complication: Secondary | ICD-10-CM | POA: Diagnosis not present

## 2022-08-31 DIAGNOSIS — Z992 Dependence on renal dialysis: Secondary | ICD-10-CM | POA: Diagnosis not present

## 2022-08-31 DIAGNOSIS — N2581 Secondary hyperparathyroidism of renal origin: Secondary | ICD-10-CM | POA: Diagnosis not present

## 2022-08-31 DIAGNOSIS — N186 End stage renal disease: Secondary | ICD-10-CM | POA: Diagnosis not present

## 2022-09-02 DIAGNOSIS — Z992 Dependence on renal dialysis: Secondary | ICD-10-CM | POA: Diagnosis not present

## 2022-09-02 DIAGNOSIS — N2581 Secondary hyperparathyroidism of renal origin: Secondary | ICD-10-CM | POA: Diagnosis not present

## 2022-09-02 DIAGNOSIS — N186 End stage renal disease: Secondary | ICD-10-CM | POA: Diagnosis not present

## 2022-09-04 DIAGNOSIS — Z992 Dependence on renal dialysis: Secondary | ICD-10-CM | POA: Diagnosis not present

## 2022-09-04 DIAGNOSIS — N186 End stage renal disease: Secondary | ICD-10-CM | POA: Diagnosis not present

## 2022-09-04 DIAGNOSIS — N2581 Secondary hyperparathyroidism of renal origin: Secondary | ICD-10-CM | POA: Diagnosis not present

## 2022-09-07 DIAGNOSIS — N186 End stage renal disease: Secondary | ICD-10-CM | POA: Diagnosis not present

## 2022-09-07 DIAGNOSIS — N2581 Secondary hyperparathyroidism of renal origin: Secondary | ICD-10-CM | POA: Diagnosis not present

## 2022-09-07 DIAGNOSIS — Z992 Dependence on renal dialysis: Secondary | ICD-10-CM | POA: Diagnosis not present

## 2022-09-09 DIAGNOSIS — Z992 Dependence on renal dialysis: Secondary | ICD-10-CM | POA: Diagnosis not present

## 2022-09-09 DIAGNOSIS — N186 End stage renal disease: Secondary | ICD-10-CM | POA: Diagnosis not present

## 2022-09-09 DIAGNOSIS — N2581 Secondary hyperparathyroidism of renal origin: Secondary | ICD-10-CM | POA: Diagnosis not present

## 2022-09-11 DIAGNOSIS — Z992 Dependence on renal dialysis: Secondary | ICD-10-CM | POA: Diagnosis not present

## 2022-09-11 DIAGNOSIS — N2581 Secondary hyperparathyroidism of renal origin: Secondary | ICD-10-CM | POA: Diagnosis not present

## 2022-09-11 DIAGNOSIS — N186 End stage renal disease: Secondary | ICD-10-CM | POA: Diagnosis not present

## 2022-09-14 DIAGNOSIS — Z992 Dependence on renal dialysis: Secondary | ICD-10-CM | POA: Diagnosis not present

## 2022-09-14 DIAGNOSIS — N2581 Secondary hyperparathyroidism of renal origin: Secondary | ICD-10-CM | POA: Diagnosis not present

## 2022-09-14 DIAGNOSIS — N186 End stage renal disease: Secondary | ICD-10-CM | POA: Diagnosis not present

## 2022-09-16 DIAGNOSIS — Z992 Dependence on renal dialysis: Secondary | ICD-10-CM | POA: Diagnosis not present

## 2022-09-16 DIAGNOSIS — N186 End stage renal disease: Secondary | ICD-10-CM | POA: Diagnosis not present

## 2022-09-16 DIAGNOSIS — N2581 Secondary hyperparathyroidism of renal origin: Secondary | ICD-10-CM | POA: Diagnosis not present

## 2022-09-17 ENCOUNTER — Ambulatory Visit (INDEPENDENT_AMBULATORY_CARE_PROVIDER_SITE_OTHER): Payer: Medicare PPO | Admitting: Cardiology

## 2022-09-17 ENCOUNTER — Encounter (HOSPITAL_BASED_OUTPATIENT_CLINIC_OR_DEPARTMENT_OTHER): Payer: Self-pay | Admitting: Cardiology

## 2022-09-17 VITALS — BP 132/70 | HR 98 | Ht 67.0 in | Wt 157.0 lb

## 2022-09-17 DIAGNOSIS — Z1231 Encounter for screening mammogram for malignant neoplasm of breast: Secondary | ICD-10-CM | POA: Diagnosis not present

## 2022-09-17 DIAGNOSIS — I340 Nonrheumatic mitral (valve) insufficiency: Secondary | ICD-10-CM

## 2022-09-17 DIAGNOSIS — Z8673 Personal history of transient ischemic attack (TIA), and cerebral infarction without residual deficits: Secondary | ICD-10-CM | POA: Diagnosis not present

## 2022-09-17 DIAGNOSIS — I10 Essential (primary) hypertension: Secondary | ICD-10-CM

## 2022-09-17 DIAGNOSIS — Z8679 Personal history of other diseases of the circulatory system: Secondary | ICD-10-CM

## 2022-09-17 DIAGNOSIS — Z992 Dependence on renal dialysis: Secondary | ICD-10-CM

## 2022-09-17 DIAGNOSIS — R0989 Other specified symptoms and signs involving the circulatory and respiratory systems: Secondary | ICD-10-CM

## 2022-09-17 DIAGNOSIS — N186 End stage renal disease: Secondary | ICD-10-CM

## 2022-09-17 NOTE — Patient Instructions (Signed)
Medication Instructions:  Your Physician recommend you continue on your current medication as directed.    *If you need a refill on your cardiac medications before your next appointment, please call your pharmacy*   Lab Work: None ordered today   Testing/Procedures: None ordered today   Follow-Up: At [facility], you and your health needs are our priority.  As part of our continuing mission to provide you with exceptional heart care, we have created designated Provider Care Teams.  These Care Teams include your primary Cardiologist (physician) and Advanced Practice Providers (APPs -  Physician Assistants and Nurse Practitioners) who all work together to provide you with the care you need, when you need it.  We recommend signing up for the patient portal called "MyChart".  Sign up information is provided on this After Visit Summary.  MyChart is used to connect with patients for Virtual Visits (Telemedicine).  Patients are able to view lab/test results, encounter notes, upcoming appointments, etc.  Non-urgent messages can be sent to your provider as well.   To learn more about what you can do with MyChart, go to [email or web address].    Your next appointment:   1 year(s)  The format for your next appointment:   In Person  Provider:   Bridgette Christopher, MD          

## 2022-09-17 NOTE — Progress Notes (Signed)
Cardiology Office Note:    Date:  09/17/2022   ID:  [name], DOB [birth year], MRN [id number]  PCP:  Manon Hilding, MD  Cardiologist:  Buford Dresser, MD  Referring MD: Manon Hilding, MD   CC: follow up  History of Present Illness:    [name] is a 71 y.o. 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 I initially met her 02/01/20 as a new consult at the request of Sasser, Silvestre Moment, MD for the evaluation and management of mitral valve regurgitation.  Note from Dr. Prescott Gum from 01/24/20 reviewed. Also reviewed prolonged hospitalization records from 10/2019-12/05/19 for recurrent pseudomonas bacteremia (initiallly 06/2019), mitral valve endocarditis, embolic stroke. She received 6 week course of IV antibiotics. Also noted to have left IJ DVT, recommended for three months of anticoagulation with apixaban (initially on coumadin due to ESRD, but labile INRs were a challenge, transitioned to DOAC). She is chronically nonambulatory and uses a wheelchair. Felt to be a very poor surgical candidate, recommended for medical management of her MR.  At her last visit she reported doing well. She noted occasional shortness of breath but this was improving. She had dialysis prior to that visit and was given medication to keep her blood pressure up. Hypotension is a chronic problem with her dialysis. Sometimes she feels like she needs oxygen during dialysis, but she has not asked for this lately. After leaving dialysis, she felt very fatigued. She noticed herself stuttering more frequently when trying to speak. She reported some easy bruisability as well. She denied any other history of DVT/clot. She had a tunneled R HD catheter in her chest. She was not told how long she needed to remain on anticoagulation. Per the available notes, this was planned to continue for 3 mos post  her DVT noted 10/21/2019. However, DVT was still present in R IJ on 11/22/19 despite anticoagulation with coumadin. She was switched to apixaban in 11/2019 due to labile INRS, with plans for 3 mos of anticoagulation.  When I saw her in 07/2020, she reported that she was still on anticoagulation for her DVT, with a temporary hold for recent eye surgery. One year later, she reported that no one has told her if or when she can stop anticoagulation.  Today: She states that she is doing very well. Her dialysis is going much better and overall she feels great. Her last dialysis treatment was yesterday and she was actually able to reduce her treatment time by 15 minutes. She has not had any further issues of hypotension with dialysis runs while on Midodrine. They have not had to reduce how much fluid they are removing at dialysis due to any negative symptoms.  She denies any palpitations, chest heaviness, or chest pain. No lightheadedness, headaches, syncope, orthopnea, or PND. Also has no lower extremity edema or exertional symptoms. She states that her breathing has been doing very well.  She is still on the Eliquis. She denies any bleeding issues including bloody stools or hematuria. As noted above, she has only been diagnosed with 1 prior DVT.   Past Medical History:  Diagnosis Date   Arthritis    hands   Constipation    Diabetes mellitus without complication ([place])    Type II   ESRD (end stage renal disease) ([place])     M/W/F- Hemodialysis   GERD (gastroesophageal reflux disease)  Headache    Hyperlipidemia    Hypertension    Irregular heart rate    Stroke ([place])    TIA - approx 2010- no residual    Past Surgical History:  Procedure Laterality Date   ABDOMINAL HYSTERECTOMY     AORTIC ARCH ANGIOGRAPHY N/A 03/28/2019   Procedure: AORTIC ARCH ANGIOGRAPHY;  Surgeon: Waynetta Sandy, MD;  Location: [place] [facility];  Service: Cardiovascular;  Laterality: N/A;   AV FISTULA PLACEMENT  Left 06/13/2018   Procedure: ARTERIOVENOUS (AV) FISTULA CREATION;  Surgeon: Rosetta Posner, MD;  Location: [place];  Service: Vascular;  Laterality: Left;   AV FISTULA PLACEMENT Left 08/18/2018   Procedure: INSERTION OF 4-7MM X 45CM ARTERIOVENOUS (AV) GORE-TEX GRAFT LEFT  FOREARM;  Surgeon: Rosetta Posner, MD;  Location: [place];  Service: Vascular;  Laterality: Left;   AV FISTULA PLACEMENT Right 04/06/2019   Procedure: INSERTION OF ARTERIOVENOUS (AV) GORE-TEX GRAFT RIGHT UPPER ARM;  Surgeon: Waynetta Sandy, MD;  Location: [place];  Service: Vascular;  Laterality: Right;   AV FISTULA PLACEMENT Right 04/13/2019   Procedure: INSERTION OF ARTERIOVENOUS (AV) BOVINE  ARTEGRAFT GRAFT RIGHT UPPER EXTREMITY;  Surgeon: Serafina Mitchell, MD;  Location: [place];  Service: Vascular;  Laterality: Right;   [place] REMOVAL Right 05/16/2019   Procedure: REMOVAL OF ARTERIOVENOUS GORETEX GRAFT ([place]) RIGHT ARM;  Surgeon: Angelia Mould, MD;  Location: [place];  Service: Vascular;  Laterality: Right;   [place] Right 03/07/2019   Procedure: First Stage Bascilic Vein Transposition Right Arm;  Surgeon: Serafina Mitchell, MD;  Location: [place];  Service: Vascular;  Laterality: Right;   CATARACT EXTRACTION Right 2005   INSERTION OF DIALYSIS CATHETER N/A 08/18/2018   Procedure: INSERTION OF DIALYSIS CATHETER;  Surgeon: Rosetta Posner, MD;  [facility];  Service: Vascular;  Laterality: N/A;   INSERTION OF DIALYSIS CATHETER Right 07/25/2019   Procedure: INSERTION OF DIALYSIS CATHETER RIGHT INTERNAL JUGULAR (TUNNELED);  Surgeon: Virl Cagey, MD;  Location: [facility];  Service: General;  Laterality: Right;   IR FLUORO GUIDE CV LINE LEFT  10/26/2019   IR FLUORO GUIDE CV LINE RIGHT  12/06/2018   IR PTA ADDL CENTRAL DIALYSIS SEG THRU DIALY CIRCUIT RIGHT Right 12/06/2018   IR REMOVAL TUN CV CATH W/O FL  10/23/2019   IR US GUIDE VASC ACCESS LEFT  10/26/2019   TEE WITHOUT CARDIOVERSION N/A 10/26/2019   Procedure:  TRANSESOPHAGEAL ECHOCARDIOGRAM (TEE);  Surgeon: Lelon Perla, MD;  Location: [facility];  Service: Cardiovascular;  Laterality: N/A;   THROMBECTOMY AND REVISION OF ARTERIOVENTOUS (AV) GORETEX  GRAFT Left 09/29/2018   Procedure: INSERTION OF ARTERIOVENTOUS (AV) GORETEX  GRAFT ARM;  Surgeon: Waynetta Sandy, MD;  Location: [place];  Service: Vascular;  Laterality: Left;   UPPER EXTREMITY ANGIOGRAPHY Right 03/28/2019   Procedure: UPPER EXTREMITY ANGIOGRAPHY;  Surgeon: Waynetta Sandy, MD;  Location: [place] [facility];  Service: Cardiovascular;  Laterality: Right;   VENOGRAM  10/27/2018   Procedure: Venogram;  Surgeon: Marty Heck, MD;  Location: [place];  Service: Cardiovascular;;  bilateral arm    Current Medications: Current Outpatient Medications on File Prior to Visit  Medication Sig   acetaminophen (TYLENOL) 325 MG tablet Take 2 tablets (650 mg total) by mouth every 12 (twelve) hours as needed for mild pain (or temp > 37.5 C (99.5 F)).   apixaban (ELIQUIS) 5 MG TABS tablet Take 1 tablet (5 mg total) by  mouth 2 (two) times daily.   carvedilol (COREG) 12.5 MG tablet Take 1 tablet (12.5 mg total) by mouth 2 (two) times daily.   Darbepoetin Alfa (ARANESP) 100 MCG/0.5ML SOSY injection Inject 0.5 mLs (100 mcg total) into the vein every Monday with hemodialysis.   diclofenac Sodium (VOLTAREN) 1 % GEL Apply 2 g topically 4 (four) times daily.   divalproex (DEPAKOTE) 250 MG DR tablet TAKE ONE TABLET BY MOUTH TWICE DAILY (MORNING,EVENING)   doxercalciferol (HECTOROL) 4 MCG/2ML injection Inject 0.75 mLs (1.5 mcg total) into the vein every Monday, Wednesday, and Friday with hemodialysis.   ferric gluconate 125 mg in sodium chloride 0.9 % 100 mL Inject 125 mg into the vein every Monday.   gabapentin (NEURONTIN) 300 MG capsule Take 1 capsule (300 mg total) by mouth daily as needed (nerve pain).   isosorbide mononitrate (IMDUR) 60 MG 24 hr tablet Take 1 tablet (60 mg  total) by mouth daily.   multivitamin (RENA-VIT) TABS tablet Take 1 tablet by mouth daily after lunch.   pantoprazole (PROTONIX) 40 MG tablet Take 1 tablet (40 mg total) by mouth daily.   No current facility-administered medications on file prior to visit.     Allergies:   Patient has no known allergies.   Social History   Tobacco Use   Smoking status: Never   Smokeless tobacco: Never  Vaping Use   Vaping Use: Never used  Substance Use Topics   Alcohol use: Never   Drug use: Never    Family History: family history includes Alcoholism in her father; Cirrhosis in her father; Diabetes in her mother; Heart failure in her mother; Heart murmur in her mother.  ROS:   Please see the history of present illness.   Additional pertinent ROS otherwise unremarkable.  EKGs/Labs/Other Studies Reviewed:    The following studies were reviewed today:  TTE 05/09/20 1. Left ventricular ejection fraction, by estimation, is 55 to 60%. The  left ventricle has normal function. The left ventricle has no regional  wall motion abnormalities. There is moderate left ventricular hypertrophy.  Left ventricular diastolic parameters are consistent with Grade I diastolic dysfunction (impaired relaxation).   2. Right ventricular systolic function is normal. The right ventricular  size is normal. There is normal pulmonary artery systolic pressure.   3. Left atrial size was mildly dilated.   4. Exuberant MAC and MV calcification and thickening Mobile calcification at the base of the anterior leaflet likely from MAC but cannot r/o vegetation. The mitral valve is degenerative. Mild mitral valve  regurgitation. No evidence of mitral stenosis.   5. The aortic valve is tricuspid. Aortic valve regurgitation is not  visualized. severe sclerosis without stenosis.   6. The inferior vena cava is normal in size with greater than 50%  respiratory variability, suggesting right atrial pressure of 3 mmHg.   TTE  11/20/19 1. Left ventricular ejection fraction, by visual estimation, is 55 to  60%. The left ventricle has normal function. Left ventricular septal wall  thickness was severely increased. Moderately increased left ventricular  posterior wall thickness.   2. Elevated left atrial and left ventricular end-diastolic pressures.   3. Left ventricular diastolic parameters are consistent with Grade I  diastolic dysfunction (impaired relaxation).   4. Moderate thickening of the anterior mitral valve leaflet(s).   5. Moderate vegetation on the mitral valve.   6. The mitral valve is abnormal. Moderate to severe mitral valve  regurgitation.   7. Tricuspid valve regurgitation moderate.   8. Tricuspid  valve regurgitation moderate.   9. Moderate thickening.  10. Normal pulmonary artery systolic pressure.  11. A prior study was performed on 10/26/2019.  12. Changes from prior study are noted.  13. Compared to the prior TEE, moderate AMVL endocarditis persists with a mobile mass noted on the anterior leaflet ~0.5 cm in diameter, may be  vegetation or ruptured apparatus. There is moderate to severe, posteriorly  directed MR. Clinical correlation with CHF symptoms is recommended.   TEE 10/26/19  1. Left ventricular ejection fraction, by visual estimation, is 55 to  60%. The left ventricle has normal function. There is mildly increased  left ventricular hypertrophy.   2. Global right ventricle has normal systolic function.The right  ventricular size is normal.   3. Left atrial size was normal.   4. Right atrial size was normal.   5. Trivial pericardial effusion is present.   6. Large vegetation on the mitral valve.   7. The mitral valve is abnormal. Moderate mitral valve regurgitation.   8. The tricuspid valve is grossly normal. Tricuspid valve regurgitation  is mild.   9. The aortic valve is tricuspid. Aortic valve regurgitation is trivial.  Mild aortic valve sclerosis without stenosis.  10. The  pulmonic valve was grossly normal. Pulmonic valve regurgitation is mild.  11. Moderate plaque invoving the descending aorta.  12. Normal LV systolic function; large vegetation on anterior MV leaflet  with moderate, eccentric, posterolaterally directed MR; mild TR.   EKG:  EKG is personally reviewed.   09/17/2022: NSR at 98 bpm with PACs 08/04/2020: NSR at 87 bpm 06/03/2020: sinus rhythm at 81 bpm.  Recent Labs: No results found for requested labs within last 365 days.  Recent Lipid Panel    Component Value Date/Time   CHOL 120 11/15/2019 0718   TRIG 177 (H) 11/15/2019 0718   HDL 28 (L) 11/15/2019 0718   CHOLHDL 4.3 11/15/2019 0718   VLDL 35 11/15/2019 0718   LDLCALC 57 11/15/2019 0718    Physical Exam:    VS:  BP 132/70   Pulse 98   Ht _0  (1.702 m)   Wt 157 lb (71.2 kg)   BMI 24.59 kg/m     Wt Readings from Last 3 Encounters:  09/17/22 157 lb (71.2 kg)  08/04/21 158 lb 6.4 oz (71.8 kg)  05/05/21 157 lb 10.1 oz (71.5 kg)    GEN: frail appearing, but in no acute distress HEENT: Normal, moist mucous membranes NECK: No JVD CARDIAC: regular rhythm, normal S1 and S2, no rubs or gallops. 2/6 holosystolic murmur. VASCULAR: Radial and DP pulses 2+ bilaterally. No carotid bruits RESPIRATORY:  Clear to auscultation without rales, wheezing or rhonchi  ABDOMEN: Soft, non-tender, non-distended MUSCULOSKELETAL:  Ambulates independently SKIN: Warm and dry, no edema NEUROLOGIC:  Alert and oriented x 3. No focal neuro deficits noted. PSYCHIATRIC:  Normal affect    ASSESSMENT:    1. Essential hypertension   2. Nonrheumatic mitral valve regurgitation   3. History of bacterial endocarditis   4. History of embolic stroke   5. ESRD on dialysis [facility])    PLAN:    mitral regurgitation, secondary to endocarditis of the anterior mitral valve: -has completed antibiotic treatment -not a surgical candidate given frailty/comorbidities -no active heart failure symptoms -volume  management per HD -echo 2021 with improvement in MR. Would continue with afterload reduction and volume management  Multiple embolic strokes, presumed from mitral valve endocarditis: -follows with neurology  ESRD, on dialysis: -Reports improved tolerance to dialysis.  Would continue afterload reduction if blood pressure allows, but may may ned to scale back on carvedilol/imdur in the future if hypotension worsens  Hypertension: labile blood pressure -as above, swings between hypertension and hypotension with dialysis. Improved recently -on carvedilol 12.5 mg BID -on imdur 60 mg daily  Type II diabetes: -on insulin  Right IJ DVT: -2/2 tunneled catheter -if there is another long term indication (ie concern with long term tunneled cath/risk for clot), could consider indefinite anticoagulation. Otherwise, would consider rechecking IJ for clot and discontinuing apixaban if no other indication. She denies bleeding issues currently, she is comfortable continuing apixaban. Defer to her PCP and nephrology on other indications for anticoagulation -had labile INRs on coumadin  Plan for follow up:  1 year  Medication Adjustments/Labs and Tests Ordered: Current medicines are reviewed at length with the patient today.  Concerns regarding medicines are outlined above.  Orders Placed This Encounter  Procedures   EKG 12-Lead   No orders of the defined types were placed in this encounter.  Patient Instructions  Medication Instructions:  Your Physician recommend you continue on your current medication as directed.    *If you need a refill on your cardiac medications before your next appointment, please call your pharmacy*   Lab Work: None ordered today   Testing/Procedures: None ordered today   Follow-Up: At [facility], you and your health needs are our priority.  As part of our continuing mission to provide you with exceptional heart care, we have created designated Provider  Care Teams.  These Care Teams include your primary Cardiologist (physician) and Advanced Practice Providers (APPs -  Physician Assistants and Nurse Practitioners) who all work together to provide you with the care you need, when you need it.  We recommend signing up for the patient portal called "MyChart".  Sign up information is provided on this After Visit Summary.  MyChart is used to connect with patients for Virtual Visits (Telemedicine).  Patients are able to view lab/test results, encounter notes, upcoming appointments, etc.  Non-urgent messages can be sent to your provider as well.   To learn more about what you can do with MyChart, go to [email or web address].    Your next appointment:   1 year(s)  The format for your next appointment:   In Person  Provider:   Buford Dresser, MD              I,Alexis Herring,acting as a scribe for Buford Dresser, MD.,have documented all relevant documentation on the behalf of Buford Dresser, MD,as directed by  Buford Dresser, MD while in the presence of Buford Dresser, MD.  I, Buford Dresser, MD, have reviewed all documentation for this visit. The documentation on 12/15/22 for the exam, diagnosis, procedures, and orders are all accurate and complete.    Signed, Buford Dresser, MD PhD 09/17/2022     [place]

## 2022-09-18 DIAGNOSIS — N186 End stage renal disease: Secondary | ICD-10-CM | POA: Diagnosis not present

## 2022-09-18 DIAGNOSIS — N2581 Secondary hyperparathyroidism of renal origin: Secondary | ICD-10-CM | POA: Diagnosis not present

## 2022-09-18 DIAGNOSIS — Z992 Dependence on renal dialysis: Secondary | ICD-10-CM | POA: Diagnosis not present

## 2022-09-21 DIAGNOSIS — N2581 Secondary hyperparathyroidism of renal origin: Secondary | ICD-10-CM | POA: Diagnosis not present

## 2022-09-21 DIAGNOSIS — Z992 Dependence on renal dialysis: Secondary | ICD-10-CM | POA: Diagnosis not present

## 2022-09-21 DIAGNOSIS — N186 End stage renal disease: Secondary | ICD-10-CM | POA: Diagnosis not present

## 2022-09-22 DIAGNOSIS — N186 End stage renal disease: Secondary | ICD-10-CM | POA: Diagnosis not present

## 2022-09-22 DIAGNOSIS — Z992 Dependence on renal dialysis: Secondary | ICD-10-CM | POA: Diagnosis not present

## 2022-09-23 DIAGNOSIS — N186 End stage renal disease: Secondary | ICD-10-CM | POA: Diagnosis not present

## 2022-09-23 DIAGNOSIS — N2581 Secondary hyperparathyroidism of renal origin: Secondary | ICD-10-CM | POA: Diagnosis not present

## 2022-09-23 DIAGNOSIS — Z992 Dependence on renal dialysis: Secondary | ICD-10-CM | POA: Diagnosis not present

## 2022-09-25 DIAGNOSIS — N186 End stage renal disease: Secondary | ICD-10-CM | POA: Diagnosis not present

## 2022-09-25 DIAGNOSIS — Z992 Dependence on renal dialysis: Secondary | ICD-10-CM | POA: Diagnosis not present

## 2022-09-25 DIAGNOSIS — N2581 Secondary hyperparathyroidism of renal origin: Secondary | ICD-10-CM | POA: Diagnosis not present

## 2022-09-28 DIAGNOSIS — N186 End stage renal disease: Secondary | ICD-10-CM | POA: Diagnosis not present

## 2022-09-28 DIAGNOSIS — Z992 Dependence on renal dialysis: Secondary | ICD-10-CM | POA: Diagnosis not present

## 2022-09-28 DIAGNOSIS — N2581 Secondary hyperparathyroidism of renal origin: Secondary | ICD-10-CM | POA: Diagnosis not present

## 2022-09-30 DIAGNOSIS — Z992 Dependence on renal dialysis: Secondary | ICD-10-CM | POA: Diagnosis not present

## 2022-09-30 DIAGNOSIS — N186 End stage renal disease: Secondary | ICD-10-CM | POA: Diagnosis not present

## 2022-09-30 DIAGNOSIS — N2581 Secondary hyperparathyroidism of renal origin: Secondary | ICD-10-CM | POA: Diagnosis not present

## 2022-10-02 DIAGNOSIS — Z992 Dependence on renal dialysis: Secondary | ICD-10-CM | POA: Diagnosis not present

## 2022-10-02 DIAGNOSIS — N186 End stage renal disease: Secondary | ICD-10-CM | POA: Diagnosis not present

## 2022-10-02 DIAGNOSIS — N2581 Secondary hyperparathyroidism of renal origin: Secondary | ICD-10-CM | POA: Diagnosis not present

## 2022-10-05 DIAGNOSIS — N186 End stage renal disease: Secondary | ICD-10-CM | POA: Diagnosis not present

## 2022-10-05 DIAGNOSIS — N2581 Secondary hyperparathyroidism of renal origin: Secondary | ICD-10-CM | POA: Diagnosis not present

## 2022-10-05 DIAGNOSIS — Z992 Dependence on renal dialysis: Secondary | ICD-10-CM | POA: Diagnosis not present

## 2022-10-07 DIAGNOSIS — N186 End stage renal disease: Secondary | ICD-10-CM | POA: Diagnosis not present

## 2022-10-07 DIAGNOSIS — N2581 Secondary hyperparathyroidism of renal origin: Secondary | ICD-10-CM | POA: Diagnosis not present

## 2022-10-07 DIAGNOSIS — Z992 Dependence on renal dialysis: Secondary | ICD-10-CM | POA: Diagnosis not present

## 2022-10-09 DIAGNOSIS — N186 End stage renal disease: Secondary | ICD-10-CM | POA: Diagnosis not present

## 2022-10-09 DIAGNOSIS — N2581 Secondary hyperparathyroidism of renal origin: Secondary | ICD-10-CM | POA: Diagnosis not present

## 2022-10-09 DIAGNOSIS — Z992 Dependence on renal dialysis: Secondary | ICD-10-CM | POA: Diagnosis not present

## 2022-10-12 DIAGNOSIS — N2581 Secondary hyperparathyroidism of renal origin: Secondary | ICD-10-CM | POA: Diagnosis not present

## 2022-10-12 DIAGNOSIS — Z992 Dependence on renal dialysis: Secondary | ICD-10-CM | POA: Diagnosis not present

## 2022-10-12 DIAGNOSIS — N186 End stage renal disease: Secondary | ICD-10-CM | POA: Diagnosis not present

## 2022-10-14 ENCOUNTER — Ambulatory Visit (INDEPENDENT_AMBULATORY_CARE_PROVIDER_SITE_OTHER): Payer: Medicare PPO | Admitting: Vascular Surgery

## 2022-10-14 ENCOUNTER — Encounter: Payer: Self-pay | Admitting: Vascular Surgery

## 2022-10-14 VITALS — BP 177/85 | HR 91 | Temp 98.0°F | Ht 67.0 in | Wt 156.0 lb

## 2022-10-14 DIAGNOSIS — N186 End stage renal disease: Secondary | ICD-10-CM | POA: Diagnosis not present

## 2022-10-14 DIAGNOSIS — Z992 Dependence on renal dialysis: Secondary | ICD-10-CM | POA: Diagnosis not present

## 2022-10-14 DIAGNOSIS — N2581 Secondary hyperparathyroidism of renal origin: Secondary | ICD-10-CM | POA: Diagnosis not present

## 2022-10-14 NOTE — Progress Notes (Signed)
[facility] [place]  Patient name: [name] MRN: [id number] DOB: 1951/07/25 Sex: female  REASON FOR VISIT: Discuss access options for hemodialysis  HPI: [name] is a 71 y.o. female here today for discussion of access options.  She currently has a right IJ tunneled catheter and reports that she has been dialyzing via this for 3 to 4 years.  She had an extensive past history of bilateral upper extremity access.  She had Silas Sedam failure of multiple of these.  Most recently she had an infected Artegraft which was removed in June 2020.  In her last office visit with Dr. Scot Dock, recommendation was for a thigh graft which she declined.  She reports that she has had 1 episode of infection in her catheter in the last 3 years and had to have a catheter replacement.  She reports that she is attempting to be considered for transplant.  Past Medical History:  Diagnosis Date   Arthritis    hands   Constipation    Diabetes mellitus without complication ([place])    Type II   ESRD (end stage renal disease) ([place])     M/W/F- Hemodialysis   GERD (gastroesophageal reflux disease)    Headache    Hyperlipidemia    Hypertension    Irregular heart rate    Stroke ([facility])    TIA - approx 2010- no residual    Family History  Problem Relation Age of Onset   Heart murmur Mother    Heart failure Mother    Diabetes Mother    Cirrhosis Father    Alcoholism Father     SOCIAL HISTORY: Social History   Tobacco Use   Smoking status: Never   Smokeless tobacco: Never  Substance Use Topics   Alcohol use: Never    No Known Allergies  Current Outpatient Medications  Medication Sig Dispense Refill   acetaminophen (TYLENOL) 325 MG tablet Take 2 tablets (650 mg total) by mouth every 12 (twelve) hours as needed for mild pain (or temp > 37.5 C (99.5 F)).     apixaban (ELIQUIS) 5 MG TABS tablet Take 1 tablet (5 mg total) by mouth 2 (two)  times daily. 60 tablet 1   carvedilol (COREG) 12.5 MG tablet Take 1 tablet (12.5 mg total) by mouth 2 (two) times daily. 60 tablet 1   Darbepoetin Alfa (ARANESP) 100 MCG/0.5ML SOSY injection Inject 0.5 mLs (100 mcg total) into the vein every Monday with hemodialysis. 4.2 mL    diclofenac Sodium (VOLTAREN) 1 % GEL Apply 2 g topically 4 (four) times daily. 300 g 1   divalproex (DEPAKOTE) 250 MG DR tablet TAKE ONE TABLET BY MOUTH TWICE DAILY (MORNING,EVENING) 60 tablet 0   doxercalciferol (HECTOROL) 4 MCG/2ML injection Inject 0.75 mLs (1.5 mcg total) into the vein every Monday, Wednesday, and Friday with hemodialysis. 2 mL 0   ferric gluconate 125 mg in sodium chloride 0.9 % 100 mL Inject 125 mg into the vein every Monday.     gabapentin (NEURONTIN) 300 MG capsule Take 1 capsule (300 mg total) by mouth daily as needed (nerve pain).     isosorbide mononitrate (IMDUR) 60 MG 24 hr tablet Take 1 tablet (60 mg total) by mouth daily. 60 tablet 1   multivitamin (RENA-VIT) TABS tablet Take 1 tablet by mouth daily after lunch. 30 tablet 0   pantoprazole (PROTONIX) 40 MG tablet Take 1 tablet (40 mg total) by mouth daily. 30 tablet 0   No current facility-administered medications  for this visit.    REVIEW OF SYSTEMS:  [X]  denotes positive finding, [ ]  denotes negative finding Cardiac  Comments:  Chest pain or chest pressure:    Shortness of breath upon exertion:    Short of breath when lying flat:    Irregular heart rhythm:        Vascular    Pain in calf, thigh, or hip brought on by ambulation:    Pain in feet at night that wakes you up from your sleep:     Blood clot in your veins:    Leg swelling:           PHYSICAL EXAM: Vitals:   10/14/22 1014  BP: (!) 177/85  Pulse: 91  Temp: 98 F (36.7 C)  SpO2: 98%  Weight: 156 lb (70.8 kg)  Height: 5\' 7"  (1.702 m)    GENERAL: The patient is a well-nourished female, in no acute distress. The vital signs are documented above. CARDIOVASCULAR:  Palpable radial pulses bilaterally.  1+ posterior tibial pulses bilaterally. PULMONARY: There is good air exchange  MUSCULOSKELETAL: There are no major deformities or cyanosis. NEUROLOGIC: No focal weakness or paresthesias are detected. SKIN: There are no ulcers or rashes noted. PSYCHIATRIC: The patient has a normal affect.  DATA:  None  MEDICAL ISSUES: Again had a long discussion with the patient regarding options for hemodialysis.  I do not feel that she has any upper extremity options as these have been exhausted in the past.  I did discuss the option for femoral loop graft.  Explained that this would arise from her femoral artery in the groin and terminate in the femoral vein.  She is not willing to consider this option.  I did again discussed the risk of ongoing catheter use and the potential for severe infection.  She understands and will continue with catheter at this time.  You are available if she wished to proceed with femoral loop graft in the future    Rosetta Posner, MD FACS [facility] Tel [phone]  Note: Portions of this report may have been transcribed using voice recognition software.  Every effort has been made to ensure accuracy; however, inadvertent computerized transcription errors may still be present.

## 2022-10-16 DIAGNOSIS — Z992 Dependence on renal dialysis: Secondary | ICD-10-CM | POA: Diagnosis not present

## 2022-10-16 DIAGNOSIS — N186 End stage renal disease: Secondary | ICD-10-CM | POA: Diagnosis not present

## 2022-10-16 DIAGNOSIS — N2581 Secondary hyperparathyroidism of renal origin: Secondary | ICD-10-CM | POA: Diagnosis not present

## 2022-10-19 DIAGNOSIS — N2581 Secondary hyperparathyroidism of renal origin: Secondary | ICD-10-CM | POA: Diagnosis not present

## 2022-10-19 DIAGNOSIS — Z992 Dependence on renal dialysis: Secondary | ICD-10-CM | POA: Diagnosis not present

## 2022-10-19 DIAGNOSIS — N186 End stage renal disease: Secondary | ICD-10-CM | POA: Diagnosis not present

## 2022-10-21 DIAGNOSIS — N186 End stage renal disease: Secondary | ICD-10-CM | POA: Diagnosis not present

## 2022-10-21 DIAGNOSIS — N2581 Secondary hyperparathyroidism of renal origin: Secondary | ICD-10-CM | POA: Diagnosis not present

## 2022-10-21 DIAGNOSIS — Z992 Dependence on renal dialysis: Secondary | ICD-10-CM | POA: Diagnosis not present

## 2022-10-22 DIAGNOSIS — N2581 Secondary hyperparathyroidism of renal origin: Secondary | ICD-10-CM | POA: Diagnosis not present

## 2022-10-22 DIAGNOSIS — N186 End stage renal disease: Secondary | ICD-10-CM | POA: Diagnosis not present

## 2022-10-22 DIAGNOSIS — Z992 Dependence on renal dialysis: Secondary | ICD-10-CM | POA: Diagnosis not present

## 2022-10-27 DIAGNOSIS — N2581 Secondary hyperparathyroidism of renal origin: Secondary | ICD-10-CM | POA: Diagnosis not present

## 2022-10-27 DIAGNOSIS — Z992 Dependence on renal dialysis: Secondary | ICD-10-CM | POA: Diagnosis not present

## 2022-10-27 DIAGNOSIS — N186 End stage renal disease: Secondary | ICD-10-CM | POA: Diagnosis not present

## 2022-10-28 DIAGNOSIS — N2581 Secondary hyperparathyroidism of renal origin: Secondary | ICD-10-CM | POA: Diagnosis not present

## 2022-10-28 DIAGNOSIS — Z992 Dependence on renal dialysis: Secondary | ICD-10-CM | POA: Diagnosis not present

## 2022-10-28 DIAGNOSIS — N186 End stage renal disease: Secondary | ICD-10-CM | POA: Diagnosis not present

## 2022-10-30 DIAGNOSIS — N186 End stage renal disease: Secondary | ICD-10-CM | POA: Diagnosis not present

## 2022-10-30 DIAGNOSIS — N2581 Secondary hyperparathyroidism of renal origin: Secondary | ICD-10-CM | POA: Diagnosis not present

## 2022-10-30 DIAGNOSIS — Z992 Dependence on renal dialysis: Secondary | ICD-10-CM | POA: Diagnosis not present

## 2022-11-02 DIAGNOSIS — N186 End stage renal disease: Secondary | ICD-10-CM | POA: Diagnosis not present

## 2022-11-02 DIAGNOSIS — N2581 Secondary hyperparathyroidism of renal origin: Secondary | ICD-10-CM | POA: Diagnosis not present

## 2022-11-02 DIAGNOSIS — Z992 Dependence on renal dialysis: Secondary | ICD-10-CM | POA: Diagnosis not present

## 2022-11-04 DIAGNOSIS — N2581 Secondary hyperparathyroidism of renal origin: Secondary | ICD-10-CM | POA: Diagnosis not present

## 2022-11-04 DIAGNOSIS — N186 End stage renal disease: Secondary | ICD-10-CM | POA: Diagnosis not present

## 2022-11-04 DIAGNOSIS — Z992 Dependence on renal dialysis: Secondary | ICD-10-CM | POA: Diagnosis not present

## 2022-11-06 DIAGNOSIS — N186 End stage renal disease: Secondary | ICD-10-CM | POA: Diagnosis not present

## 2022-11-06 DIAGNOSIS — Z992 Dependence on renal dialysis: Secondary | ICD-10-CM | POA: Diagnosis not present

## 2022-11-06 DIAGNOSIS — N2581 Secondary hyperparathyroidism of renal origin: Secondary | ICD-10-CM | POA: Diagnosis not present

## 2022-11-09 DIAGNOSIS — Z992 Dependence on renal dialysis: Secondary | ICD-10-CM | POA: Diagnosis not present

## 2022-11-09 DIAGNOSIS — N186 End stage renal disease: Secondary | ICD-10-CM | POA: Diagnosis not present

## 2022-11-09 DIAGNOSIS — N2581 Secondary hyperparathyroidism of renal origin: Secondary | ICD-10-CM | POA: Diagnosis not present

## 2022-11-11 DIAGNOSIS — N186 End stage renal disease: Secondary | ICD-10-CM | POA: Diagnosis not present

## 2022-11-11 DIAGNOSIS — N2581 Secondary hyperparathyroidism of renal origin: Secondary | ICD-10-CM | POA: Diagnosis not present

## 2022-11-11 DIAGNOSIS — Z992 Dependence on renal dialysis: Secondary | ICD-10-CM | POA: Diagnosis not present

## 2022-11-14 DIAGNOSIS — N2581 Secondary hyperparathyroidism of renal origin: Secondary | ICD-10-CM | POA: Diagnosis not present

## 2022-11-14 DIAGNOSIS — Z992 Dependence on renal dialysis: Secondary | ICD-10-CM | POA: Diagnosis not present

## 2022-11-14 DIAGNOSIS — N186 End stage renal disease: Secondary | ICD-10-CM | POA: Diagnosis not present

## 2022-11-17 DIAGNOSIS — N186 End stage renal disease: Secondary | ICD-10-CM | POA: Diagnosis not present

## 2022-11-17 DIAGNOSIS — N2581 Secondary hyperparathyroidism of renal origin: Secondary | ICD-10-CM | POA: Diagnosis not present

## 2022-11-17 DIAGNOSIS — Z992 Dependence on renal dialysis: Secondary | ICD-10-CM | POA: Diagnosis not present

## 2022-11-20 DIAGNOSIS — N2581 Secondary hyperparathyroidism of renal origin: Secondary | ICD-10-CM | POA: Diagnosis not present

## 2022-11-20 DIAGNOSIS — N186 End stage renal disease: Secondary | ICD-10-CM | POA: Diagnosis not present

## 2022-11-20 DIAGNOSIS — Z992 Dependence on renal dialysis: Secondary | ICD-10-CM | POA: Diagnosis not present

## 2022-11-22 DIAGNOSIS — Z992 Dependence on renal dialysis: Secondary | ICD-10-CM | POA: Diagnosis not present

## 2022-11-22 DIAGNOSIS — N186 End stage renal disease: Secondary | ICD-10-CM | POA: Diagnosis not present

## 2022-11-23 DIAGNOSIS — Z992 Dependence on renal dialysis: Secondary | ICD-10-CM | POA: Diagnosis not present

## 2022-11-23 DIAGNOSIS — N186 End stage renal disease: Secondary | ICD-10-CM | POA: Diagnosis not present

## 2022-11-23 DIAGNOSIS — N2581 Secondary hyperparathyroidism of renal origin: Secondary | ICD-10-CM | POA: Diagnosis not present

## 2022-11-25 DIAGNOSIS — Z992 Dependence on renal dialysis: Secondary | ICD-10-CM | POA: Diagnosis not present

## 2022-11-25 DIAGNOSIS — N2581 Secondary hyperparathyroidism of renal origin: Secondary | ICD-10-CM | POA: Diagnosis not present

## 2022-11-25 DIAGNOSIS — N186 End stage renal disease: Secondary | ICD-10-CM | POA: Diagnosis not present

## 2022-11-27 DIAGNOSIS — Z992 Dependence on renal dialysis: Secondary | ICD-10-CM | POA: Diagnosis not present

## 2022-11-27 DIAGNOSIS — N2581 Secondary hyperparathyroidism of renal origin: Secondary | ICD-10-CM | POA: Diagnosis not present

## 2022-11-27 DIAGNOSIS — N186 End stage renal disease: Secondary | ICD-10-CM | POA: Diagnosis not present

## 2022-11-30 DIAGNOSIS — Z992 Dependence on renal dialysis: Secondary | ICD-10-CM | POA: Diagnosis not present

## 2022-11-30 DIAGNOSIS — N2581 Secondary hyperparathyroidism of renal origin: Secondary | ICD-10-CM | POA: Diagnosis not present

## 2022-11-30 DIAGNOSIS — N186 End stage renal disease: Secondary | ICD-10-CM | POA: Diagnosis not present

## 2022-12-02 DIAGNOSIS — Z992 Dependence on renal dialysis: Secondary | ICD-10-CM | POA: Diagnosis not present

## 2022-12-02 DIAGNOSIS — N2581 Secondary hyperparathyroidism of renal origin: Secondary | ICD-10-CM | POA: Diagnosis not present

## 2022-12-02 DIAGNOSIS — N186 End stage renal disease: Secondary | ICD-10-CM | POA: Diagnosis not present

## 2022-12-04 DIAGNOSIS — N2581 Secondary hyperparathyroidism of renal origin: Secondary | ICD-10-CM | POA: Diagnosis not present

## 2022-12-04 DIAGNOSIS — Z992 Dependence on renal dialysis: Secondary | ICD-10-CM | POA: Diagnosis not present

## 2022-12-04 DIAGNOSIS — N186 End stage renal disease: Secondary | ICD-10-CM | POA: Diagnosis not present

## 2022-12-07 DIAGNOSIS — N2581 Secondary hyperparathyroidism of renal origin: Secondary | ICD-10-CM | POA: Diagnosis not present

## 2022-12-07 DIAGNOSIS — Z992 Dependence on renal dialysis: Secondary | ICD-10-CM | POA: Diagnosis not present

## 2022-12-07 DIAGNOSIS — N186 End stage renal disease: Secondary | ICD-10-CM | POA: Diagnosis not present

## 2022-12-09 DIAGNOSIS — Z992 Dependence on renal dialysis: Secondary | ICD-10-CM | POA: Diagnosis not present

## 2022-12-09 DIAGNOSIS — N186 End stage renal disease: Secondary | ICD-10-CM | POA: Diagnosis not present

## 2022-12-09 DIAGNOSIS — N2581 Secondary hyperparathyroidism of renal origin: Secondary | ICD-10-CM | POA: Diagnosis not present

## 2022-12-11 DIAGNOSIS — Z992 Dependence on renal dialysis: Secondary | ICD-10-CM | POA: Diagnosis not present

## 2022-12-11 DIAGNOSIS — N2581 Secondary hyperparathyroidism of renal origin: Secondary | ICD-10-CM | POA: Diagnosis not present

## 2022-12-11 DIAGNOSIS — N186 End stage renal disease: Secondary | ICD-10-CM | POA: Diagnosis not present

## 2022-12-14 DIAGNOSIS — N2581 Secondary hyperparathyroidism of renal origin: Secondary | ICD-10-CM | POA: Diagnosis not present

## 2022-12-14 DIAGNOSIS — Z992 Dependence on renal dialysis: Secondary | ICD-10-CM | POA: Diagnosis not present

## 2022-12-14 DIAGNOSIS — N186 End stage renal disease: Secondary | ICD-10-CM | POA: Diagnosis not present

## 2022-12-15 ENCOUNTER — Encounter (HOSPITAL_BASED_OUTPATIENT_CLINIC_OR_DEPARTMENT_OTHER): Payer: Self-pay | Admitting: Cardiology

## 2022-12-16 DIAGNOSIS — Z992 Dependence on renal dialysis: Secondary | ICD-10-CM | POA: Diagnosis not present

## 2022-12-16 DIAGNOSIS — N186 End stage renal disease: Secondary | ICD-10-CM | POA: Diagnosis not present

## 2022-12-16 DIAGNOSIS — N2581 Secondary hyperparathyroidism of renal origin: Secondary | ICD-10-CM | POA: Diagnosis not present

## 2022-12-18 DIAGNOSIS — N2581 Secondary hyperparathyroidism of renal origin: Secondary | ICD-10-CM | POA: Diagnosis not present

## 2022-12-18 DIAGNOSIS — N186 End stage renal disease: Secondary | ICD-10-CM | POA: Diagnosis not present

## 2022-12-18 DIAGNOSIS — Z992 Dependence on renal dialysis: Secondary | ICD-10-CM | POA: Diagnosis not present

## 2022-12-21 DIAGNOSIS — Z992 Dependence on renal dialysis: Secondary | ICD-10-CM | POA: Diagnosis not present

## 2022-12-21 DIAGNOSIS — N186 End stage renal disease: Secondary | ICD-10-CM | POA: Diagnosis not present

## 2022-12-21 DIAGNOSIS — N2581 Secondary hyperparathyroidism of renal origin: Secondary | ICD-10-CM | POA: Diagnosis not present

## 2022-12-23 DIAGNOSIS — N2581 Secondary hyperparathyroidism of renal origin: Secondary | ICD-10-CM | POA: Diagnosis not present

## 2022-12-23 DIAGNOSIS — Z992 Dependence on renal dialysis: Secondary | ICD-10-CM | POA: Diagnosis not present

## 2022-12-23 DIAGNOSIS — N186 End stage renal disease: Secondary | ICD-10-CM | POA: Diagnosis not present

## 2022-12-25 DIAGNOSIS — N186 End stage renal disease: Secondary | ICD-10-CM | POA: Diagnosis not present

## 2022-12-25 DIAGNOSIS — N2581 Secondary hyperparathyroidism of renal origin: Secondary | ICD-10-CM | POA: Diagnosis not present

## 2022-12-25 DIAGNOSIS — Z992 Dependence on renal dialysis: Secondary | ICD-10-CM | POA: Diagnosis not present

## 2022-12-28 DIAGNOSIS — N186 End stage renal disease: Secondary | ICD-10-CM | POA: Diagnosis not present

## 2022-12-28 DIAGNOSIS — Z992 Dependence on renal dialysis: Secondary | ICD-10-CM | POA: Diagnosis not present

## 2022-12-28 DIAGNOSIS — N2581 Secondary hyperparathyroidism of renal origin: Secondary | ICD-10-CM | POA: Diagnosis not present

## 2022-12-30 DIAGNOSIS — N2581 Secondary hyperparathyroidism of renal origin: Secondary | ICD-10-CM | POA: Diagnosis not present

## 2022-12-30 DIAGNOSIS — Z992 Dependence on renal dialysis: Secondary | ICD-10-CM | POA: Diagnosis not present

## 2022-12-30 DIAGNOSIS — N186 End stage renal disease: Secondary | ICD-10-CM | POA: Diagnosis not present

## 2023-01-01 DIAGNOSIS — Z992 Dependence on renal dialysis: Secondary | ICD-10-CM | POA: Diagnosis not present

## 2023-01-01 DIAGNOSIS — N2581 Secondary hyperparathyroidism of renal origin: Secondary | ICD-10-CM | POA: Diagnosis not present

## 2023-01-01 DIAGNOSIS — N186 End stage renal disease: Secondary | ICD-10-CM | POA: Diagnosis not present

## 2023-01-04 DIAGNOSIS — Z992 Dependence on renal dialysis: Secondary | ICD-10-CM | POA: Diagnosis not present

## 2023-01-04 DIAGNOSIS — N186 End stage renal disease: Secondary | ICD-10-CM | POA: Diagnosis not present

## 2023-01-04 DIAGNOSIS — N2581 Secondary hyperparathyroidism of renal origin: Secondary | ICD-10-CM | POA: Diagnosis not present

## 2023-01-06 DIAGNOSIS — N186 End stage renal disease: Secondary | ICD-10-CM | POA: Diagnosis not present

## 2023-01-06 DIAGNOSIS — Z992 Dependence on renal dialysis: Secondary | ICD-10-CM | POA: Diagnosis not present

## 2023-01-06 DIAGNOSIS — N2581 Secondary hyperparathyroidism of renal origin: Secondary | ICD-10-CM | POA: Diagnosis not present

## 2023-01-08 DIAGNOSIS — N186 End stage renal disease: Secondary | ICD-10-CM | POA: Diagnosis not present

## 2023-01-08 DIAGNOSIS — N2581 Secondary hyperparathyroidism of renal origin: Secondary | ICD-10-CM | POA: Diagnosis not present

## 2023-01-08 DIAGNOSIS — Z992 Dependence on renal dialysis: Secondary | ICD-10-CM | POA: Diagnosis not present

## 2023-01-11 DIAGNOSIS — Z992 Dependence on renal dialysis: Secondary | ICD-10-CM | POA: Diagnosis not present

## 2023-01-11 DIAGNOSIS — N186 End stage renal disease: Secondary | ICD-10-CM | POA: Diagnosis not present

## 2023-01-11 DIAGNOSIS — N2581 Secondary hyperparathyroidism of renal origin: Secondary | ICD-10-CM | POA: Diagnosis not present

## 2023-01-13 DIAGNOSIS — N186 End stage renal disease: Secondary | ICD-10-CM | POA: Diagnosis not present

## 2023-01-13 DIAGNOSIS — N2581 Secondary hyperparathyroidism of renal origin: Secondary | ICD-10-CM | POA: Diagnosis not present

## 2023-01-13 DIAGNOSIS — Z992 Dependence on renal dialysis: Secondary | ICD-10-CM | POA: Diagnosis not present

## 2023-01-18 DIAGNOSIS — N2581 Secondary hyperparathyroidism of renal origin: Secondary | ICD-10-CM | POA: Diagnosis not present

## 2023-01-18 DIAGNOSIS — N186 End stage renal disease: Secondary | ICD-10-CM | POA: Diagnosis not present

## 2023-01-18 DIAGNOSIS — Z992 Dependence on renal dialysis: Secondary | ICD-10-CM | POA: Diagnosis not present

## 2023-01-20 DIAGNOSIS — Z992 Dependence on renal dialysis: Secondary | ICD-10-CM | POA: Diagnosis not present

## 2023-01-20 DIAGNOSIS — N186 End stage renal disease: Secondary | ICD-10-CM | POA: Diagnosis not present

## 2023-01-20 DIAGNOSIS — N2581 Secondary hyperparathyroidism of renal origin: Secondary | ICD-10-CM | POA: Diagnosis not present

## 2023-01-21 DIAGNOSIS — Z992 Dependence on renal dialysis: Secondary | ICD-10-CM | POA: Diagnosis not present

## 2023-01-21 DIAGNOSIS — N186 End stage renal disease: Secondary | ICD-10-CM | POA: Diagnosis not present

## 2023-01-21 DIAGNOSIS — R531 Weakness: Secondary | ICD-10-CM | POA: Diagnosis not present

## 2023-01-22 DIAGNOSIS — Z992 Dependence on renal dialysis: Secondary | ICD-10-CM | POA: Diagnosis not present

## 2023-01-22 DIAGNOSIS — N2581 Secondary hyperparathyroidism of renal origin: Secondary | ICD-10-CM | POA: Diagnosis not present

## 2023-01-22 DIAGNOSIS — N186 End stage renal disease: Secondary | ICD-10-CM | POA: Diagnosis not present

## 2023-01-25 DIAGNOSIS — N2581 Secondary hyperparathyroidism of renal origin: Secondary | ICD-10-CM | POA: Diagnosis not present

## 2023-01-25 DIAGNOSIS — N186 End stage renal disease: Secondary | ICD-10-CM | POA: Diagnosis not present

## 2023-01-25 DIAGNOSIS — Z992 Dependence on renal dialysis: Secondary | ICD-10-CM | POA: Diagnosis not present

## 2023-01-26 DIAGNOSIS — R531 Weakness: Secondary | ICD-10-CM | POA: Diagnosis not present

## 2023-01-27 DIAGNOSIS — Z992 Dependence on renal dialysis: Secondary | ICD-10-CM | POA: Diagnosis not present

## 2023-01-27 DIAGNOSIS — N186 End stage renal disease: Secondary | ICD-10-CM | POA: Diagnosis not present

## 2023-01-27 DIAGNOSIS — N2581 Secondary hyperparathyroidism of renal origin: Secondary | ICD-10-CM | POA: Diagnosis not present

## 2023-01-28 DIAGNOSIS — R531 Weakness: Secondary | ICD-10-CM | POA: Diagnosis not present

## 2023-02-01 DIAGNOSIS — Z992 Dependence on renal dialysis: Secondary | ICD-10-CM | POA: Diagnosis not present

## 2023-02-01 DIAGNOSIS — N2581 Secondary hyperparathyroidism of renal origin: Secondary | ICD-10-CM | POA: Diagnosis not present

## 2023-02-01 DIAGNOSIS — N186 End stage renal disease: Secondary | ICD-10-CM | POA: Diagnosis not present

## 2023-02-02 DIAGNOSIS — R531 Weakness: Secondary | ICD-10-CM | POA: Diagnosis not present

## 2023-02-03 DIAGNOSIS — N2581 Secondary hyperparathyroidism of renal origin: Secondary | ICD-10-CM | POA: Diagnosis not present

## 2023-02-03 DIAGNOSIS — Z992 Dependence on renal dialysis: Secondary | ICD-10-CM | POA: Diagnosis not present

## 2023-02-03 DIAGNOSIS — N186 End stage renal disease: Secondary | ICD-10-CM | POA: Diagnosis not present

## 2023-02-04 DIAGNOSIS — R531 Weakness: Secondary | ICD-10-CM | POA: Diagnosis not present

## 2023-02-05 DIAGNOSIS — Z992 Dependence on renal dialysis: Secondary | ICD-10-CM | POA: Diagnosis not present

## 2023-02-05 DIAGNOSIS — N2581 Secondary hyperparathyroidism of renal origin: Secondary | ICD-10-CM | POA: Diagnosis not present

## 2023-02-05 DIAGNOSIS — N186 End stage renal disease: Secondary | ICD-10-CM | POA: Diagnosis not present

## 2023-02-08 ENCOUNTER — Other Ambulatory Visit: Payer: Self-pay

## 2023-02-08 ENCOUNTER — Emergency Department (HOSPITAL_COMMUNITY)
Admission: EM | Admit: 2023-02-08 | Discharge: 2023-02-08 | Disposition: A | Discharge status: Home / Self Care | Payer: Medicare PPO | Attending: Emergency Medicine | Admitting: Emergency Medicine

## 2023-02-08 ENCOUNTER — Encounter (HOSPITAL_COMMUNITY): Payer: Self-pay

## 2023-02-08 ENCOUNTER — Emergency Department (HOSPITAL_COMMUNITY): Payer: Medicare PPO

## 2023-02-08 DIAGNOSIS — N186 End stage renal disease: Secondary | ICD-10-CM | POA: Diagnosis not present

## 2023-02-08 DIAGNOSIS — Z7901 Long term (current) use of anticoagulants: Secondary | ICD-10-CM | POA: Diagnosis not present

## 2023-02-08 DIAGNOSIS — N2581 Secondary hyperparathyroidism of renal origin: Secondary | ICD-10-CM | POA: Diagnosis not present

## 2023-02-08 DIAGNOSIS — Z992 Dependence on renal dialysis: Secondary | ICD-10-CM | POA: Insufficient documentation

## 2023-02-08 DIAGNOSIS — R42 Dizziness and giddiness: Secondary | ICD-10-CM | POA: Insufficient documentation

## 2023-02-08 DIAGNOSIS — E1122 Type 2 diabetes mellitus with diabetic chronic kidney disease: Secondary | ICD-10-CM | POA: Diagnosis not present

## 2023-02-08 DIAGNOSIS — Z79899 Other long term (current) drug therapy: Secondary | ICD-10-CM | POA: Diagnosis not present

## 2023-02-08 DIAGNOSIS — R519 Headache, unspecified: Secondary | ICD-10-CM | POA: Diagnosis not present

## 2023-02-08 LAB — CBC
HCT: 29.2 % — ABNORMAL LOW (ref 36.0–46.0)
Hemoglobin: 9.1 g/dL — ABNORMAL LOW (ref 12.0–15.0)
MCH: 27.4 pg (ref 26.0–34.0)
MCHC: 31.2 g/dL (ref 30.0–36.0)
MCV: 88 fL (ref 80.0–100.0)
Platelets: 165 10*3/uL (ref 150–400)
RBC: 3.32 MIL/uL — ABNORMAL LOW (ref 3.87–5.11)
RDW: 15.9 % — ABNORMAL HIGH (ref 11.5–15.5)
WBC: 6.3 10*3/uL (ref 4.0–10.5)
nRBC: 0 % (ref 0.0–0.2)

## 2023-02-08 LAB — COMPREHENSIVE METABOLIC PANEL
ALT: 9 U/L (ref 0–44)
AST: 16 U/L (ref 15–41)
Albumin: 3.8 g/dL (ref 3.5–5.0)
Alkaline Phosphatase: 57 U/L (ref 38–126)
Anion gap: 9 (ref 5–15)
BUN: 13 mg/dL (ref 8–23)
CO2: 30 mmol/L (ref 22–32)
Calcium: 8.4 mg/dL — ABNORMAL LOW (ref 8.9–10.3)
Chloride: 97 mmol/L — ABNORMAL LOW (ref 98–111)
Creatinine, Ser: 5.02 mg/dL — ABNORMAL HIGH (ref 0.44–1.00)
GFR, Estimated: 9 mL/min — ABNORMAL LOW (ref >60)
Glucose, Bld: 113 mg/dL — ABNORMAL HIGH (ref 70–99)
Potassium: 3.5 mmol/L (ref 3.5–5.1)
Sodium: 136 mmol/L (ref 135–145)
Total Bilirubin: 0.7 mg/dL (ref 0.3–1.2)
Total Protein: 7.3 g/dL (ref 6.5–8.1)

## 2023-02-08 NOTE — ED Notes (Signed)
Per patient and family headache is minimally present at the time

## 2023-02-08 NOTE — ED Triage Notes (Signed)
Pt reports headache x 2 days that tylenol will not relieve.  Pt just came from dialysis and headache did not improve.  Pt is hypertensive in [place] but did not take her meds prior to dialysis.

## 2023-02-08 NOTE — ED Provider Notes (Signed)
[facility] Provider Note   CSN: [id number] Arrival date & time: 02/08/23  1613     History  Chief Complaint  Patient presents with   Headache    [name] is a 71 y.o. female.   Headache  This patient is a 72 year old female, she has end-stage renal disease on dialysis, she is on Eliquis, she has her dialysis on Monday Wednesdays and Fridays and was able to complete a complete dialysis session today.  According to the patient and her son who was at the bedside she has had some difficulty with her intermittent confusion recently.  This been going on over the last week, she also states she has had an associated headache associated with this but denies any visual changes or nausea vomiting or diarrhea, no chest pain or cough or shortness of breath.  No difficulty using the arms of the legs.  She reports that it is a "woozy" feeling, sometimes positional when she stands up.  She denies any other symptoms at all.  She has had no medications for this.    Home Medications Prior to Admission medications   Medication Sig Start Date End Date Taking? Authorizing Provider  acetaminophen (TYLENOL) 325 MG tablet Take 2 tablets (650 mg total) by mouth every 12 (twelve) hours as needed for mild pain (or temp > 37.5 C (99.5 F)). Patient taking differently: Take 1,300 mg by mouth every 12 (twelve) hours as needed for mild pain (or temp > 37.5 C (99.5 F)). 12/04/19  Yes Angiulli, Lavon Paganini, PA-C  alendronate (FOSAMAX) 70 MG tablet Take 70 mg by mouth once a week. 12/16/22  Yes [provider]  amLODipine (NORVASC) 10 MG tablet Take by mouth. 10/12/19  Yes [provider]  apixaban (ELIQUIS) 5 MG TABS tablet Take 1 tablet (5 mg total) by mouth 2 (two) times daily. 12/28/19  Yes Raulkar, Clide Deutscher, MD  carvedilol (COREG) 12.5 MG tablet Take 1 tablet (12.5 mg total) by mouth 2 (two) times daily. 12/19/19  Yes Raulkar, Clide Deutscher, MD   diclofenac Sodium (VOLTAREN) 1 % GEL Apply 2 g topically 4 (four) times daily. 03/14/20  Yes Raulkar, Clide Deutscher, MD  isosorbide mononitrate (IMDUR) 60 MG 24 hr tablet Take 1 tablet (60 mg total) by mouth daily. 12/28/19  Yes Raulkar, Clide Deutscher, MD  midodrine (PROAMATINE) 10 MG tablet Take 10 mg by mouth daily. 12/16/22  Yes [provider]  pantoprazole (PROTONIX) 40 MG tablet Take 1 tablet (40 mg total) by mouth daily. 12/05/19  Yes Angiulli, Lavon Paganini, PA-C      Allergies    Patient has no known allergies.    Review of Systems   Review of Systems  Neurological:  Positive for headaches.  All other systems reviewed and are negative.   Physical Exam Updated Vital Signs BP (!) 175/89   Pulse 87   Temp 98.4 F (36.9 C) (Oral)   Resp 16   Ht 1.702 m (5\' 7" )   Wt 69.9 kg   SpO2 96%   BMI 24.12 kg/m  Physical Exam Vitals and nursing note reviewed.  Constitutional:      General: She is not in acute distress.    Appearance: She is well-developed.  HENT:     Head: Normocephalic and atraumatic.     Mouth/Throat:     Pharynx: No oropharyngeal exudate.  Eyes:     General: No scleral icterus.       Right eye:  No discharge.        Left eye: No discharge.     Conjunctiva/sclera: Conjunctivae normal.     Pupils: Pupils are equal, round, and reactive to light.  Neck:     Thyroid: No thyromegaly.     Vascular: No JVD.  Cardiovascular:     Rate and Rhythm: Normal rate and regular rhythm.     Heart sounds: Murmur heard.     No friction rub. No gallop.     Comments: Dialysis catheter right upper chest Pulmonary:     Effort: Pulmonary effort is normal. No respiratory distress.     Breath sounds: Normal breath sounds. No wheezing or rales.  Abdominal:     General: Bowel sounds are normal. There is no distension.     Palpations: Abdomen is soft. There is no mass.     Tenderness: There is no abdominal tenderness.  Musculoskeletal:        General: No tenderness. Normal range of  motion.     Cervical back: Normal range of motion and neck supple.     Right lower leg: No edema.     Left lower leg: No edema.  Lymphadenopathy:     Cervical: No cervical adenopathy.  Skin:    General: Skin is warm and dry.     Findings: No erythema or rash.  Neurological:     Mental Status: She is alert.     Coordination: Coordination normal.     Comments: Answers my questions, moves all 4 extremities per normal, no facial droop, clear speech  Psychiatric:        Behavior: Behavior normal.     ED Results / Procedures / Treatments   Labs (all labs ordered are listed, but only abnormal results are displayed) Labs Reviewed  CBC - Abnormal; Notable for the following components:      Result Value   RBC 3.32 (*)    Hemoglobin 9.1 (*)    HCT 29.2 (*)    RDW 15.9 (*)    All other components within normal limits  COMPREHENSIVE METABOLIC PANEL - Abnormal; Notable for the following components:   Chloride 97 (*)    Glucose, Bld 113 (*)    Creatinine, Ser 5.02 (*)    Calcium 8.4 (*)    GFR, Estimated 9 (*)    All other components within normal limits  CBG MONITORING, ED    EKG EKG Interpretation  Date/Time:  Monday February 08 2023 18:07:54 EDT Ventricular Rate:  87 PR Interval:  174 QRS Duration: 84 QT Interval:  408 QTC Calculation: 491 R Axis:   67 Text Interpretation: Sinus rhythm Anteroseptal infarct, age indeterminate Confirmed by Noemi Chapel [phone]) on 02/08/2023 6:21:35 PM  Radiology CT Head Wo Contrast  Result Date: 02/08/2023 CLINICAL DATA:  Headache for the past 2 days. EXAM: CT HEAD WITHOUT CONTRAST TECHNIQUE: Contiguous axial images were obtained from the base of the skull through the vertex without intravenous contrast. RADIATION DOSE REDUCTION: This exam was performed according to the departmental dose-optimization program which includes automated exposure control, adjustment of the mA and/or kV according to patient size and/or use of iterative reconstruction  technique. COMPARISON:  CT head dated August 12, 2022. FINDINGS: Brain: No evidence of acute infarction, hemorrhage, hydrocephalus, extra-axial collection or mass lesion/mass effect. Stable mild chronic microvascular ischemic changes. Vascular: Calcified atherosclerosis at the skull base. No hyperdense vessel. Skull: Normal. Negative for fracture or focal lesion. Sinuses/Orbits: No acute finding. Other: None. IMPRESSION: 1. No acute intracranial  abnormality. Stable mild chronic microvascular ischemic changes. Electronically Signed   By: Titus Dubin M.D.   On: 02/08/2023 17:35    Procedures Procedures    Medications Ordered in ED Medications - No data to display  ED Course/ Medical Decision Making/ A&P                             Medical Decision Making Amount and/or Complexity of Data Reviewed Labs: ordered. Radiology: ordered. ECG/medicine tests: ordered.   This patient presents to the ED for concern of dizziness and headache, this involves an extensive number of treatment options, and is a complaint that carries with it a high risk of complications and morbidity.  The differential diagnosis includes tumor mass stroke hemorrhage electrolyte abnormality seem less likely given recent dialysis, this been going on for several days.  She does not have any signs of fever, she is not tachycardic febrile or hypotensive.  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   Co morbidities that complicate the patient evaluation  Diabetes, end-stage renal disease   Additional history obtained:  Additional history obtained from electronic medical record External records from outside source obtained  and reviewed including cardiology notes and echocardiograms   Lab Tests:  I Ordered, and personally interpreted labs.  The pertinent results include: BC and metabolic panel unremarkable   Imaging Studies ordered:  I ordered imaging studies including CT scan of the brain I independently visualized and interpreted imaging which showed no acute findings I agree with the radiologist interpretation   Cardiac Monitoring: / EKG:  The patient was maintained on a cardiac monitor.  I personally viewed and interpreted the cardiac monitored which showed an underlying rhythm of: Sinus rhythm   Problem List / ED Course / Critical interventions / Medication management  Patient with mild dizziness, no acute findings on exam, vital signs with mild hypertension compared to baseline, they have follow-up in the morning, nothing to acutely treat, doubt stroke, neurologically intact I have reviewed the patients home medicines and have made adjustments as needed   Social Determinants of Health:  End-stage renal disease on dialysis   Test / Admission - Considered:  Considered admission but no signs of stroke or other acute findings         Final Clinical Impression(s) / ED Diagnoses Final diagnoses:  Dizziness    Rx / DC Orders ED Discharge Orders     None         Noemi Chapel, MD 02/08/23 2018

## 2023-02-08 NOTE — Discharge Instructions (Signed)
Your testing has been reassuring, there is no acute findings to suggest a cause of your symptoms.  Your blood pressure is a little bit elevated but this should come down when you take your nighttime medicine tonight.  You may return to the ER for severe worsening symptoms otherwise see your doctor in the office tomorrow at your appointment at 10:30 in the morning

## 2023-02-09 DIAGNOSIS — R030 Elevated blood-pressure reading, without diagnosis of hypertension: Secondary | ICD-10-CM | POA: Diagnosis not present

## 2023-02-09 DIAGNOSIS — N186 End stage renal disease: Secondary | ICD-10-CM | POA: Diagnosis not present

## 2023-02-09 DIAGNOSIS — Z6823 Body mass index (BMI) 23.0-23.9, adult: Secondary | ICD-10-CM | POA: Diagnosis not present

## 2023-02-09 DIAGNOSIS — R519 Headache, unspecified: Secondary | ICD-10-CM | POA: Diagnosis not present

## 2023-02-09 DIAGNOSIS — R5383 Other fatigue: Secondary | ICD-10-CM | POA: Diagnosis not present

## 2023-02-09 DIAGNOSIS — E1122 Type 2 diabetes mellitus with diabetic chronic kidney disease: Secondary | ICD-10-CM | POA: Diagnosis not present

## 2023-02-10 ENCOUNTER — Emergency Department (HOSPITAL_COMMUNITY)
Admission: EM | Admit: 2023-02-10 | Discharge: 2023-02-10 | Discharge status: Against Medical Advice | Payer: Medicare PPO | Attending: Emergency Medicine | Admitting: Emergency Medicine

## 2023-02-10 ENCOUNTER — Other Ambulatory Visit: Payer: Self-pay

## 2023-02-10 ENCOUNTER — Encounter (HOSPITAL_COMMUNITY): Payer: Self-pay | Admitting: *Deleted

## 2023-02-10 DIAGNOSIS — N186 End stage renal disease: Secondary | ICD-10-CM | POA: Diagnosis not present

## 2023-02-10 DIAGNOSIS — R519 Headache, unspecified: Secondary | ICD-10-CM | POA: Diagnosis present

## 2023-02-10 DIAGNOSIS — Z992 Dependence on renal dialysis: Secondary | ICD-10-CM | POA: Insufficient documentation

## 2023-02-10 DIAGNOSIS — I10 Essential (primary) hypertension: Secondary | ICD-10-CM | POA: Insufficient documentation

## 2023-02-10 DIAGNOSIS — Z5321 Procedure and treatment not carried out due to patient leaving prior to being seen by health care provider: Secondary | ICD-10-CM | POA: Insufficient documentation

## 2023-02-10 DIAGNOSIS — N2581 Secondary hyperparathyroidism of renal origin: Secondary | ICD-10-CM | POA: Diagnosis not present

## 2023-02-10 NOTE — ED Triage Notes (Signed)
Pt with continued HA and hypertension.  Pt states she had her dialysis done today.

## 2023-02-15 DIAGNOSIS — N2581 Secondary hyperparathyroidism of renal origin: Secondary | ICD-10-CM | POA: Diagnosis not present

## 2023-02-15 DIAGNOSIS — Z992 Dependence on renal dialysis: Secondary | ICD-10-CM | POA: Diagnosis not present

## 2023-02-15 DIAGNOSIS — N186 End stage renal disease: Secondary | ICD-10-CM | POA: Diagnosis not present

## 2023-02-17 DIAGNOSIS — N186 End stage renal disease: Secondary | ICD-10-CM | POA: Diagnosis not present

## 2023-02-17 DIAGNOSIS — N2581 Secondary hyperparathyroidism of renal origin: Secondary | ICD-10-CM | POA: Diagnosis not present

## 2023-02-17 DIAGNOSIS — Z992 Dependence on renal dialysis: Secondary | ICD-10-CM | POA: Diagnosis not present

## 2023-02-19 DIAGNOSIS — Z992 Dependence on renal dialysis: Secondary | ICD-10-CM | POA: Diagnosis not present

## 2023-02-19 DIAGNOSIS — N2581 Secondary hyperparathyroidism of renal origin: Secondary | ICD-10-CM | POA: Diagnosis not present

## 2023-02-19 DIAGNOSIS — N186 End stage renal disease: Secondary | ICD-10-CM | POA: Diagnosis not present

## 2023-02-21 DIAGNOSIS — E1165 Type 2 diabetes mellitus with hyperglycemia: Secondary | ICD-10-CM | POA: Diagnosis not present

## 2023-02-21 DIAGNOSIS — K219 Gastro-esophageal reflux disease without esophagitis: Secondary | ICD-10-CM | POA: Diagnosis not present

## 2023-02-21 DIAGNOSIS — Z992 Dependence on renal dialysis: Secondary | ICD-10-CM | POA: Diagnosis not present

## 2023-02-21 DIAGNOSIS — N186 End stage renal disease: Secondary | ICD-10-CM | POA: Diagnosis not present

## 2023-02-21 DIAGNOSIS — I10 Essential (primary) hypertension: Secondary | ICD-10-CM | POA: Diagnosis not present

## 2023-02-22 DIAGNOSIS — N2581 Secondary hyperparathyroidism of renal origin: Secondary | ICD-10-CM | POA: Diagnosis not present

## 2023-02-22 DIAGNOSIS — N186 End stage renal disease: Secondary | ICD-10-CM | POA: Diagnosis not present

## 2023-02-22 DIAGNOSIS — Z992 Dependence on renal dialysis: Secondary | ICD-10-CM | POA: Diagnosis not present

## 2023-02-23 DIAGNOSIS — Z961 Presence of intraocular lens: Secondary | ICD-10-CM | POA: Diagnosis not present

## 2023-02-23 DIAGNOSIS — H524 Presbyopia: Secondary | ICD-10-CM | POA: Diagnosis not present

## 2023-02-23 DIAGNOSIS — E113393 Type 2 diabetes mellitus with moderate nonproliferative diabetic retinopathy without macular edema, bilateral: Secondary | ICD-10-CM | POA: Diagnosis not present

## 2023-02-24 DIAGNOSIS — Z992 Dependence on renal dialysis: Secondary | ICD-10-CM | POA: Diagnosis not present

## 2023-02-24 DIAGNOSIS — N186 End stage renal disease: Secondary | ICD-10-CM | POA: Diagnosis not present

## 2023-02-24 DIAGNOSIS — N2581 Secondary hyperparathyroidism of renal origin: Secondary | ICD-10-CM | POA: Diagnosis not present

## 2023-02-26 DIAGNOSIS — Z992 Dependence on renal dialysis: Secondary | ICD-10-CM | POA: Diagnosis not present

## 2023-02-26 DIAGNOSIS — N186 End stage renal disease: Secondary | ICD-10-CM | POA: Diagnosis not present

## 2023-02-26 DIAGNOSIS — N2581 Secondary hyperparathyroidism of renal origin: Secondary | ICD-10-CM | POA: Diagnosis not present

## 2023-03-01 ENCOUNTER — Encounter (HOSPITAL_COMMUNITY): Payer: Self-pay

## 2023-03-01 ENCOUNTER — Other Ambulatory Visit: Payer: Self-pay

## 2023-03-01 ENCOUNTER — Emergency Department (HOSPITAL_COMMUNITY)
Admission: EM | Admit: 2023-03-01 | Discharge: 2023-03-01 | Disposition: A | Discharge status: Home / Self Care | Payer: Medicare PPO | Attending: Emergency Medicine | Admitting: Emergency Medicine

## 2023-03-01 DIAGNOSIS — Z992 Dependence on renal dialysis: Secondary | ICD-10-CM | POA: Diagnosis not present

## 2023-03-01 DIAGNOSIS — N2581 Secondary hyperparathyroidism of renal origin: Secondary | ICD-10-CM | POA: Diagnosis not present

## 2023-03-01 DIAGNOSIS — R519 Headache, unspecified: Secondary | ICD-10-CM | POA: Insufficient documentation

## 2023-03-01 DIAGNOSIS — N186 End stage renal disease: Secondary | ICD-10-CM | POA: Diagnosis not present

## 2023-03-01 DIAGNOSIS — I120 Hypertensive chronic kidney disease with stage 5 chronic kidney disease or end stage renal disease: Secondary | ICD-10-CM | POA: Diagnosis not present

## 2023-03-01 LAB — BASIC METABOLIC PANEL
Anion gap: 13 (ref 5–15)
BUN: 32 mg/dL — ABNORMAL HIGH (ref 8–23)
CO2: 23 mmol/L (ref 22–32)
Calcium: 8.9 mg/dL (ref 8.9–10.3)
Chloride: 100 mmol/L (ref 98–111)
Creatinine, Ser: 8.55 mg/dL — ABNORMAL HIGH (ref 0.44–1.00)
GFR, Estimated: 5 mL/min — ABNORMAL LOW (ref >60)
Glucose, Bld: 155 mg/dL — ABNORMAL HIGH (ref 70–99)
Potassium: 4.1 mmol/L (ref 3.5–5.1)
Sodium: 136 mmol/L (ref 135–145)

## 2023-03-01 LAB — CBC WITH DIFFERENTIAL/PLATELET
Abs Immature Granulocytes: 0.02 10*3/uL (ref 0.00–0.07)
Basophils Absolute: 0 10*3/uL (ref 0.0–0.1)
Basophils Relative: 0 %
Eosinophils Absolute: 0.1 10*3/uL (ref 0.0–0.5)
Eosinophils Relative: 2 %
HCT: 28.6 % — ABNORMAL LOW (ref 36.0–46.0)
Hemoglobin: 9 g/dL — ABNORMAL LOW (ref 12.0–15.0)
Immature Granulocytes: 0 %
Lymphocytes Relative: 18 %
Lymphs Abs: 1 10*3/uL (ref 0.7–4.0)
MCH: 27.7 pg (ref 26.0–34.0)
MCHC: 31.5 g/dL (ref 30.0–36.0)
MCV: 88 fL (ref 80.0–100.0)
Monocytes Absolute: 0.4 10*3/uL (ref 0.1–1.0)
Monocytes Relative: 7 %
Neutro Abs: 4.2 10*3/uL (ref 1.7–7.7)
Neutrophils Relative %: 73 %
Platelets: 150 10*3/uL (ref 150–400)
RBC: 3.25 MIL/uL — ABNORMAL LOW (ref 3.87–5.11)
RDW: 17 % — ABNORMAL HIGH (ref 11.5–15.5)
WBC: 5.8 10*3/uL (ref 4.0–10.5)
nRBC: 0 % (ref 0.0–0.2)

## 2023-03-01 MED ORDER — PROCHLORPERAZINE EDISYLATE 10 MG/2ML IJ SOLN
10.0000 mg | Freq: Once | INTRAMUSCULAR | Status: AC
  Administered 2023-03-01: 10 mg via INTRAVENOUS
  Filled 2023-03-01: qty 2

## 2023-03-01 NOTE — ED Triage Notes (Signed)
Pt arrived from home via POV c/o headache 10/10 that started this afternoon accompanied with n/v that began after pt went to bed.

## 2023-03-01 NOTE — ED Provider Notes (Signed)
[place] EMERGENCY DEPARTMENT AT [facility]  Provider Note  CSN: [id number] Arrival date & time: 03/01/23 0430  History Chief Complaint  Patient presents with   Headache    [name] is a 72 y.o. female with history of ESRD on HD MWF reports intermittent headache for the last 2 weeks, seen in the ED on 3/18 for same, had negative workup including CT head and ultimately discharged. She was back in the ED on 3/20 but left after triage. She repots some headache yesterday afternoon that had improved before she went to bed but she woke up with sharp left sided headache, associated with visual disturbance and nausea/vomiting. She is scheduled for dialysis later this morning. No fevers recently.    Home Medications Prior to Admission medications   Medication Sig Start Date End Date Taking? Authorizing Provider  acetaminophen (TYLENOL) 325 MG tablet Take 2 tablets (650 mg total) by mouth every 12 (twelve) hours as needed for mild pain (or temp > 37.5 C (99.5 F)). Patient taking differently: Take 1,300 mg by mouth every 12 (twelve) hours as needed for mild pain (or temp > 37.5 C (99.5 F)). 12/04/19   Angiulli, Mcarthur Rossetti, PA-C  alendronate (FOSAMAX) 70 MG tablet Take 70 mg by mouth once a week. 12/16/22   [provider]  amLODipine (NORVASC) 10 MG tablet Take by mouth. 10/12/19   [provider]  apixaban (ELIQUIS) 5 MG TABS tablet Take 1 tablet (5 mg total) by mouth 2 (two) times daily. 12/28/19   Raulkar, Drema Pry, MD  carvedilol (COREG) 12.5 MG tablet Take 1 tablet (12.5 mg total) by mouth 2 (two) times daily. 12/19/19   Raulkar, Drema Pry, MD  diclofenac Sodium (VOLTAREN) 1 % GEL Apply 2 g topically 4 (four) times daily. 03/14/20   Raulkar, Drema Pry, MD  isosorbide mononitrate (IMDUR) 60 MG 24 hr tablet Take 1 tablet (60 mg total) by mouth daily. 12/28/19   Raulkar, Drema Pry, MD  midodrine (PROAMATINE) 10 MG tablet Take 10 mg by mouth daily. 12/16/22   [provider]  pantoprazole (PROTONIX) 40 MG tablet Take 1 tablet (40 mg total) by mouth daily. 12/05/19   Angiulli, Mcarthur Rossetti, PA-C     Allergies    Patient has no known allergies.   Review of Systems   Review of Systems Please see HPI for pertinent positives and negatives  Physical Exam BP (!) 146/69   Pulse 94   Temp 98.2 F (36.8 C) (Oral)   Resp 18   SpO2 95%   Physical Exam Vitals and nursing note reviewed.  Constitutional:      Appearance: Normal appearance.  HENT:     Head: Normocephalic and atraumatic.     Nose: Nose normal.     Mouth/Throat:     Mouth: Mucous membranes are moist.  Eyes:     Extraocular Movements: Extraocular movements intact.     Conjunctiva/sclera: Conjunctivae normal.  Cardiovascular:     Rate and Rhythm: Normal rate.  Pulmonary:     Effort: Pulmonary effort is normal.     Breath sounds: Normal breath sounds.     Comments: Dialysis catheter in R upper chest without infection Abdominal:     General: Abdomen is flat.     Palpations: Abdomen is soft.     Tenderness: There is no abdominal tenderness.  Musculoskeletal:        General: No swelling. Normal range of motion.     Cervical back: Neck  supple.  Skin:    General: Skin is warm and dry.  Neurological:     General: No focal deficit present.     Mental Status: She is alert and oriented to person, place, and time.     Cranial Nerves: No cranial nerve deficit.     Sensory: No sensory deficit.     Motor: No weakness.     Coordination: Coordination normal.  Psychiatric:        Mood and Affect: Mood normal.     ED Results / Procedures / Treatments   EKG None  Procedures Procedures  Medications Ordered in the ED Medications  prochlorperazine (COMPAZINE) injection 10 mg (10 mg Intravenous Given 03/01/23 0521)    Initial Impression and Plan  Patient here with persistent and recurrent headache. Sounds migrainous although she denies history of same. Will check labs, give a dose  of Compazine and reassess. Avoid NSAIDs due to CKD.  ED Course   Clinical Course as of 03/01/23 0607  Mon Mar 01, 2023  0549 BMP consistent with ESRD, no acidosis or hyperkalemia.  [CS]  0556 CBC with mild anemia, at baseline. Otherwise unremarkable.  [CS]  0604 Patient feeling better and eager to be discharged so she can make her usual dialysis time slot. Advised to discuss long term management with PCP.  [CS]    Clinical Course User Index [CS] Pollyann Savoy, MD     MDM Rules/Calculators/A&P Medical Decision Making Problems Addressed: ESRD on hemodialysis: acute illness or injury Nonintractable episodic headache, unspecified headache type: acute illness or injury  Amount and/or Complexity of Data Reviewed Labs: ordered. Decision-making details documented in ED Course.  Risk Prescription drug management.     Final Clinical Impression(s) / ED Diagnoses Final diagnoses:  Nonintractable episodic headache, unspecified headache type  ESRD on hemodialysis    Rx / DC Orders ED Discharge Orders     None        Pollyann Savoy, MD 03/01/23 [phone]

## 2023-03-02 DIAGNOSIS — R030 Elevated blood-pressure reading, without diagnosis of hypertension: Secondary | ICD-10-CM | POA: Diagnosis not present

## 2023-03-02 DIAGNOSIS — M810 Age-related osteoporosis without current pathological fracture: Secondary | ICD-10-CM | POA: Diagnosis not present

## 2023-03-02 DIAGNOSIS — G43909 Migraine, unspecified, not intractable, without status migrainosus: Secondary | ICD-10-CM | POA: Diagnosis not present

## 2023-03-02 DIAGNOSIS — Z6823 Body mass index (BMI) 23.0-23.9, adult: Secondary | ICD-10-CM | POA: Diagnosis not present

## 2023-03-02 DIAGNOSIS — E1122 Type 2 diabetes mellitus with diabetic chronic kidney disease: Secondary | ICD-10-CM | POA: Diagnosis not present

## 2023-03-02 DIAGNOSIS — N186 End stage renal disease: Secondary | ICD-10-CM | POA: Diagnosis not present

## 2023-03-03 DIAGNOSIS — Z992 Dependence on renal dialysis: Secondary | ICD-10-CM | POA: Diagnosis not present

## 2023-03-03 DIAGNOSIS — N2581 Secondary hyperparathyroidism of renal origin: Secondary | ICD-10-CM | POA: Diagnosis not present

## 2023-03-03 DIAGNOSIS — N186 End stage renal disease: Secondary | ICD-10-CM | POA: Diagnosis not present

## 2023-03-04 DIAGNOSIS — I3139 Other pericardial effusion (noninflammatory): Secondary | ICD-10-CM | POA: Diagnosis not present

## 2023-03-04 DIAGNOSIS — Z043 Encounter for examination and observation following other accident: Secondary | ICD-10-CM | POA: Diagnosis not present

## 2023-03-04 DIAGNOSIS — S199XXA Unspecified injury of neck, initial encounter: Secondary | ICD-10-CM | POA: Diagnosis not present

## 2023-03-04 DIAGNOSIS — W1830XA Fall on same level, unspecified, initial encounter: Secondary | ICD-10-CM | POA: Diagnosis not present

## 2023-03-04 DIAGNOSIS — W1839XA Other fall on same level, initial encounter: Secondary | ICD-10-CM | POA: Diagnosis not present

## 2023-03-04 DIAGNOSIS — Z471 Aftercare following joint replacement surgery: Secondary | ICD-10-CM | POA: Diagnosis not present

## 2023-03-04 DIAGNOSIS — D649 Anemia, unspecified: Secondary | ICD-10-CM | POA: Diagnosis not present

## 2023-03-04 DIAGNOSIS — S72002A Fracture of unspecified part of neck of left femur, initial encounter for closed fracture: Secondary | ICD-10-CM | POA: Diagnosis not present

## 2023-03-04 DIAGNOSIS — S72032A Displaced midcervical fracture of left femur, initial encounter for closed fracture: Secondary | ICD-10-CM | POA: Diagnosis not present

## 2023-03-04 DIAGNOSIS — N179 Acute kidney failure, unspecified: Secondary | ICD-10-CM | POA: Diagnosis not present

## 2023-03-04 DIAGNOSIS — I083 Combined rheumatic disorders of mitral, aortic and tricuspid valves: Secondary | ICD-10-CM | POA: Diagnosis not present

## 2023-03-04 DIAGNOSIS — I82C22 Chronic embolism and thrombosis of left internal jugular vein: Secondary | ICD-10-CM | POA: Diagnosis not present

## 2023-03-04 DIAGNOSIS — R918 Other nonspecific abnormal finding of lung field: Secondary | ICD-10-CM | POA: Diagnosis not present

## 2023-03-04 DIAGNOSIS — R079 Chest pain, unspecified: Secondary | ICD-10-CM | POA: Diagnosis not present

## 2023-03-04 DIAGNOSIS — N2581 Secondary hyperparathyroidism of renal origin: Secondary | ICD-10-CM | POA: Diagnosis not present

## 2023-03-04 DIAGNOSIS — K869 Disease of pancreas, unspecified: Secondary | ICD-10-CM | POA: Diagnosis not present

## 2023-03-04 DIAGNOSIS — D631 Anemia in chronic kidney disease: Secondary | ICD-10-CM | POA: Diagnosis not present

## 2023-03-04 DIAGNOSIS — E871 Hypo-osmolality and hyponatremia: Secondary | ICD-10-CM | POA: Diagnosis not present

## 2023-03-04 DIAGNOSIS — R0602 Shortness of breath: Secondary | ICD-10-CM | POA: Diagnosis not present

## 2023-03-04 DIAGNOSIS — N185 Chronic kidney disease, stage 5: Secondary | ICD-10-CM | POA: Diagnosis not present

## 2023-03-04 DIAGNOSIS — N186 End stage renal disease: Secondary | ICD-10-CM | POA: Diagnosis not present

## 2023-03-04 DIAGNOSIS — Z992 Dependence on renal dialysis: Secondary | ICD-10-CM | POA: Diagnosis not present

## 2023-03-04 DIAGNOSIS — Z96642 Presence of left artificial hip joint: Secondary | ICD-10-CM | POA: Diagnosis not present

## 2023-03-04 DIAGNOSIS — M8000XA Age-related osteoporosis with current pathological fracture, unspecified site, initial encounter for fracture: Secondary | ICD-10-CM | POA: Diagnosis not present

## 2023-03-04 DIAGNOSIS — Z79899 Other long term (current) drug therapy: Secondary | ICD-10-CM | POA: Diagnosis not present

## 2023-03-04 DIAGNOSIS — E8770 Fluid overload, unspecified: Secondary | ICD-10-CM | POA: Diagnosis not present

## 2023-03-04 DIAGNOSIS — I340 Nonrheumatic mitral (valve) insufficiency: Secondary | ICD-10-CM | POA: Diagnosis not present

## 2023-03-04 DIAGNOSIS — M545 Low back pain, unspecified: Secondary | ICD-10-CM | POA: Diagnosis not present

## 2023-03-04 DIAGNOSIS — Z01810 Encounter for preprocedural cardiovascular examination: Secondary | ICD-10-CM | POA: Diagnosis not present

## 2023-03-04 DIAGNOSIS — K8689 Other specified diseases of pancreas: Secondary | ICD-10-CM | POA: Diagnosis not present

## 2023-03-04 DIAGNOSIS — I491 Atrial premature depolarization: Secondary | ICD-10-CM | POA: Diagnosis not present

## 2023-03-04 DIAGNOSIS — E119 Type 2 diabetes mellitus without complications: Secondary | ICD-10-CM | POA: Diagnosis not present

## 2023-03-04 DIAGNOSIS — T1490XA Injury, unspecified, initial encounter: Secondary | ICD-10-CM | POA: Diagnosis not present

## 2023-03-04 DIAGNOSIS — J90 Pleural effusion, not elsewhere classified: Secondary | ICD-10-CM | POA: Diagnosis not present

## 2023-03-04 DIAGNOSIS — E1122 Type 2 diabetes mellitus with diabetic chronic kidney disease: Secondary | ICD-10-CM | POA: Diagnosis not present

## 2023-03-04 DIAGNOSIS — E041 Nontoxic single thyroid nodule: Secondary | ICD-10-CM | POA: Diagnosis not present

## 2023-03-04 DIAGNOSIS — M25562 Pain in left knee: Secondary | ICD-10-CM | POA: Diagnosis not present

## 2023-03-04 DIAGNOSIS — I120 Hypertensive chronic kidney disease with stage 5 chronic kidney disease or end stage renal disease: Secondary | ICD-10-CM | POA: Diagnosis not present

## 2023-03-04 DIAGNOSIS — R9431 Abnormal electrocardiogram [ECG] [EKG]: Secondary | ICD-10-CM | POA: Diagnosis not present

## 2023-03-04 DIAGNOSIS — I10 Essential (primary) hypertension: Secondary | ICD-10-CM | POA: Diagnosis not present

## 2023-03-04 DIAGNOSIS — S72092A Other fracture of head and neck of left femur, initial encounter for closed fracture: Secondary | ICD-10-CM | POA: Diagnosis not present

## 2023-03-04 DIAGNOSIS — E785 Hyperlipidemia, unspecified: Secondary | ICD-10-CM | POA: Diagnosis not present

## 2023-03-04 DIAGNOSIS — I700 Atherosclerosis of aorta: Secondary | ICD-10-CM | POA: Diagnosis not present

## 2023-03-04 DIAGNOSIS — M79651 Pain in right thigh: Secondary | ICD-10-CM | POA: Diagnosis not present

## 2023-03-06 DIAGNOSIS — S72002A Fracture of unspecified part of neck of left femur, initial encounter for closed fracture: Secondary | ICD-10-CM | POA: Diagnosis not present

## 2023-03-10 DIAGNOSIS — N2581 Secondary hyperparathyroidism of renal origin: Secondary | ICD-10-CM | POA: Diagnosis not present

## 2023-03-10 DIAGNOSIS — N186 End stage renal disease: Secondary | ICD-10-CM | POA: Diagnosis not present

## 2023-03-10 DIAGNOSIS — Z992 Dependence on renal dialysis: Secondary | ICD-10-CM | POA: Diagnosis not present

## 2023-03-11 DIAGNOSIS — I1311 Hypertensive heart and chronic kidney disease without heart failure, with stage 5 chronic kidney disease, or end stage renal disease: Secondary | ICD-10-CM | POA: Diagnosis not present

## 2023-03-11 DIAGNOSIS — S72032D Displaced midcervical fracture of left femur, subsequent encounter for closed fracture with routine healing: Secondary | ICD-10-CM | POA: Diagnosis not present

## 2023-03-11 DIAGNOSIS — I82C22 Chronic embolism and thrombosis of left internal jugular vein: Secondary | ICD-10-CM | POA: Diagnosis not present

## 2023-03-11 DIAGNOSIS — N186 End stage renal disease: Secondary | ICD-10-CM | POA: Diagnosis not present

## 2023-03-11 DIAGNOSIS — Z992 Dependence on renal dialysis: Secondary | ICD-10-CM | POA: Diagnosis not present

## 2023-03-11 DIAGNOSIS — E1122 Type 2 diabetes mellitus with diabetic chronic kidney disease: Secondary | ICD-10-CM | POA: Diagnosis not present

## 2023-03-12 DIAGNOSIS — Z6823 Body mass index (BMI) 23.0-23.9, adult: Secondary | ICD-10-CM | POA: Diagnosis not present

## 2023-03-12 DIAGNOSIS — N2581 Secondary hyperparathyroidism of renal origin: Secondary | ICD-10-CM | POA: Diagnosis not present

## 2023-03-12 DIAGNOSIS — N186 End stage renal disease: Secondary | ICD-10-CM | POA: Diagnosis not present

## 2023-03-12 DIAGNOSIS — R030 Elevated blood-pressure reading, without diagnosis of hypertension: Secondary | ICD-10-CM | POA: Diagnosis not present

## 2023-03-12 DIAGNOSIS — M25559 Pain in unspecified hip: Secondary | ICD-10-CM | POA: Diagnosis not present

## 2023-03-12 DIAGNOSIS — Z992 Dependence on renal dialysis: Secondary | ICD-10-CM | POA: Diagnosis not present

## 2023-03-13 DIAGNOSIS — N186 End stage renal disease: Secondary | ICD-10-CM | POA: Diagnosis not present

## 2023-03-13 DIAGNOSIS — I1311 Hypertensive heart and chronic kidney disease without heart failure, with stage 5 chronic kidney disease, or end stage renal disease: Secondary | ICD-10-CM | POA: Diagnosis not present

## 2023-03-13 DIAGNOSIS — Z992 Dependence on renal dialysis: Secondary | ICD-10-CM | POA: Diagnosis not present

## 2023-03-13 DIAGNOSIS — E1122 Type 2 diabetes mellitus with diabetic chronic kidney disease: Secondary | ICD-10-CM | POA: Diagnosis not present

## 2023-03-13 DIAGNOSIS — I82C22 Chronic embolism and thrombosis of left internal jugular vein: Secondary | ICD-10-CM | POA: Diagnosis not present

## 2023-03-13 DIAGNOSIS — S72032D Displaced midcervical fracture of left femur, subsequent encounter for closed fracture with routine healing: Secondary | ICD-10-CM | POA: Diagnosis not present

## 2023-03-15 DIAGNOSIS — Z992 Dependence on renal dialysis: Secondary | ICD-10-CM | POA: Diagnosis not present

## 2023-03-15 DIAGNOSIS — N2581 Secondary hyperparathyroidism of renal origin: Secondary | ICD-10-CM | POA: Diagnosis not present

## 2023-03-15 DIAGNOSIS — I160 Hypertensive urgency: Secondary | ICD-10-CM | POA: Diagnosis not present

## 2023-03-15 DIAGNOSIS — R4182 Altered mental status, unspecified: Secondary | ICD-10-CM | POA: Diagnosis not present

## 2023-03-15 DIAGNOSIS — I6381 Other cerebral infarction due to occlusion or stenosis of small artery: Secondary | ICD-10-CM | POA: Diagnosis not present

## 2023-03-15 DIAGNOSIS — N186 End stage renal disease: Secondary | ICD-10-CM | POA: Diagnosis not present

## 2023-03-15 DIAGNOSIS — S0990XA Unspecified injury of head, initial encounter: Secondary | ICD-10-CM | POA: Diagnosis not present

## 2023-03-16 DIAGNOSIS — I517 Cardiomegaly: Secondary | ICD-10-CM | POA: Diagnosis not present

## 2023-03-16 DIAGNOSIS — J810 Acute pulmonary edema: Secondary | ICD-10-CM | POA: Diagnosis not present

## 2023-03-16 DIAGNOSIS — N186 End stage renal disease: Secondary | ICD-10-CM | POA: Diagnosis not present

## 2023-03-16 DIAGNOSIS — I160 Hypertensive urgency: Secondary | ICD-10-CM | POA: Diagnosis not present

## 2023-03-16 DIAGNOSIS — E041 Nontoxic single thyroid nodule: Secondary | ICD-10-CM | POA: Diagnosis not present

## 2023-03-16 DIAGNOSIS — I82C22 Chronic embolism and thrombosis of left internal jugular vein: Secondary | ICD-10-CM | POA: Diagnosis not present

## 2023-03-16 DIAGNOSIS — Z992 Dependence on renal dialysis: Secondary | ICD-10-CM | POA: Diagnosis not present

## 2023-03-16 DIAGNOSIS — I120 Hypertensive chronic kidney disease with stage 5 chronic kidney disease or end stage renal disease: Secondary | ICD-10-CM | POA: Diagnosis not present

## 2023-03-16 DIAGNOSIS — F05 Delirium due to known physiological condition: Secondary | ICD-10-CM | POA: Diagnosis not present

## 2023-03-16 DIAGNOSIS — R4182 Altered mental status, unspecified: Secondary | ICD-10-CM | POA: Diagnosis not present

## 2023-03-16 DIAGNOSIS — D631 Anemia in chronic kidney disease: Secondary | ICD-10-CM | POA: Diagnosis not present

## 2023-03-16 DIAGNOSIS — E1122 Type 2 diabetes mellitus with diabetic chronic kidney disease: Secondary | ICD-10-CM | POA: Diagnosis not present

## 2023-03-16 DIAGNOSIS — Z7901 Long term (current) use of anticoagulants: Secondary | ICD-10-CM | POA: Diagnosis not present

## 2023-03-16 DIAGNOSIS — G9340 Encephalopathy, unspecified: Secondary | ICD-10-CM | POA: Diagnosis not present

## 2023-03-16 DIAGNOSIS — G9341 Metabolic encephalopathy: Secondary | ICD-10-CM | POA: Diagnosis not present

## 2023-03-19 DIAGNOSIS — I82C22 Chronic embolism and thrombosis of left internal jugular vein: Secondary | ICD-10-CM | POA: Diagnosis not present

## 2023-03-19 DIAGNOSIS — N2581 Secondary hyperparathyroidism of renal origin: Secondary | ICD-10-CM | POA: Diagnosis not present

## 2023-03-19 DIAGNOSIS — N186 End stage renal disease: Secondary | ICD-10-CM | POA: Diagnosis not present

## 2023-03-19 DIAGNOSIS — E1122 Type 2 diabetes mellitus with diabetic chronic kidney disease: Secondary | ICD-10-CM | POA: Diagnosis not present

## 2023-03-19 DIAGNOSIS — Z992 Dependence on renal dialysis: Secondary | ICD-10-CM | POA: Diagnosis not present

## 2023-03-19 DIAGNOSIS — I1311 Hypertensive heart and chronic kidney disease without heart failure, with stage 5 chronic kidney disease, or end stage renal disease: Secondary | ICD-10-CM | POA: Diagnosis not present

## 2023-03-19 DIAGNOSIS — S72032D Displaced midcervical fracture of left femur, subsequent encounter for closed fracture with routine healing: Secondary | ICD-10-CM | POA: Diagnosis not present

## 2023-03-22 DIAGNOSIS — Z96642 Presence of left artificial hip joint: Secondary | ICD-10-CM | POA: Diagnosis not present

## 2023-03-22 DIAGNOSIS — I82C22 Chronic embolism and thrombosis of left internal jugular vein: Secondary | ICD-10-CM | POA: Diagnosis not present

## 2023-03-22 DIAGNOSIS — S72032D Displaced midcervical fracture of left femur, subsequent encounter for closed fracture with routine healing: Secondary | ICD-10-CM | POA: Diagnosis not present

## 2023-03-22 DIAGNOSIS — M810 Age-related osteoporosis without current pathological fracture: Secondary | ICD-10-CM | POA: Diagnosis not present

## 2023-03-22 DIAGNOSIS — I1311 Hypertensive heart and chronic kidney disease without heart failure, with stage 5 chronic kidney disease, or end stage renal disease: Secondary | ICD-10-CM | POA: Diagnosis not present

## 2023-03-22 DIAGNOSIS — N2581 Secondary hyperparathyroidism of renal origin: Secondary | ICD-10-CM | POA: Diagnosis not present

## 2023-03-22 DIAGNOSIS — Z992 Dependence on renal dialysis: Secondary | ICD-10-CM | POA: Diagnosis not present

## 2023-03-22 DIAGNOSIS — E1122 Type 2 diabetes mellitus with diabetic chronic kidney disease: Secondary | ICD-10-CM | POA: Diagnosis not present

## 2023-03-22 DIAGNOSIS — N186 End stage renal disease: Secondary | ICD-10-CM | POA: Diagnosis not present

## 2023-03-23 DIAGNOSIS — Z992 Dependence on renal dialysis: Secondary | ICD-10-CM | POA: Diagnosis not present

## 2023-03-23 DIAGNOSIS — I1311 Hypertensive heart and chronic kidney disease without heart failure, with stage 5 chronic kidney disease, or end stage renal disease: Secondary | ICD-10-CM | POA: Diagnosis not present

## 2023-03-23 DIAGNOSIS — S72032D Displaced midcervical fracture of left femur, subsequent encounter for closed fracture with routine healing: Secondary | ICD-10-CM | POA: Diagnosis not present

## 2023-03-23 DIAGNOSIS — E1122 Type 2 diabetes mellitus with diabetic chronic kidney disease: Secondary | ICD-10-CM | POA: Diagnosis not present

## 2023-03-23 DIAGNOSIS — I82C22 Chronic embolism and thrombosis of left internal jugular vein: Secondary | ICD-10-CM | POA: Diagnosis not present

## 2023-03-23 DIAGNOSIS — N186 End stage renal disease: Secondary | ICD-10-CM | POA: Diagnosis not present

## 2023-03-24 DIAGNOSIS — Z992 Dependence on renal dialysis: Secondary | ICD-10-CM | POA: Diagnosis not present

## 2023-03-24 DIAGNOSIS — N2581 Secondary hyperparathyroidism of renal origin: Secondary | ICD-10-CM | POA: Diagnosis not present

## 2023-03-24 DIAGNOSIS — N186 End stage renal disease: Secondary | ICD-10-CM | POA: Diagnosis not present

## 2023-03-26 DIAGNOSIS — N2581 Secondary hyperparathyroidism of renal origin: Secondary | ICD-10-CM | POA: Diagnosis not present

## 2023-03-26 DIAGNOSIS — I82C22 Chronic embolism and thrombosis of left internal jugular vein: Secondary | ICD-10-CM | POA: Diagnosis not present

## 2023-03-26 DIAGNOSIS — I1311 Hypertensive heart and chronic kidney disease without heart failure, with stage 5 chronic kidney disease, or end stage renal disease: Secondary | ICD-10-CM | POA: Diagnosis not present

## 2023-03-26 DIAGNOSIS — Z992 Dependence on renal dialysis: Secondary | ICD-10-CM | POA: Diagnosis not present

## 2023-03-26 DIAGNOSIS — E1122 Type 2 diabetes mellitus with diabetic chronic kidney disease: Secondary | ICD-10-CM | POA: Diagnosis not present

## 2023-03-26 DIAGNOSIS — S72032D Displaced midcervical fracture of left femur, subsequent encounter for closed fracture with routine healing: Secondary | ICD-10-CM | POA: Diagnosis not present

## 2023-03-26 DIAGNOSIS — N186 End stage renal disease: Secondary | ICD-10-CM | POA: Diagnosis not present

## 2023-03-29 DIAGNOSIS — Z992 Dependence on renal dialysis: Secondary | ICD-10-CM | POA: Diagnosis not present

## 2023-03-29 DIAGNOSIS — N2581 Secondary hyperparathyroidism of renal origin: Secondary | ICD-10-CM | POA: Diagnosis not present

## 2023-03-29 DIAGNOSIS — N186 End stage renal disease: Secondary | ICD-10-CM | POA: Diagnosis not present

## 2023-03-30 DIAGNOSIS — E1122 Type 2 diabetes mellitus with diabetic chronic kidney disease: Secondary | ICD-10-CM | POA: Diagnosis not present

## 2023-03-30 DIAGNOSIS — N186 End stage renal disease: Secondary | ICD-10-CM | POA: Diagnosis not present

## 2023-03-30 DIAGNOSIS — I1311 Hypertensive heart and chronic kidney disease without heart failure, with stage 5 chronic kidney disease, or end stage renal disease: Secondary | ICD-10-CM | POA: Diagnosis not present

## 2023-03-30 DIAGNOSIS — I82C22 Chronic embolism and thrombosis of left internal jugular vein: Secondary | ICD-10-CM | POA: Diagnosis not present

## 2023-03-30 DIAGNOSIS — S72032D Displaced midcervical fracture of left femur, subsequent encounter for closed fracture with routine healing: Secondary | ICD-10-CM | POA: Diagnosis not present

## 2023-03-30 DIAGNOSIS — Z992 Dependence on renal dialysis: Secondary | ICD-10-CM | POA: Diagnosis not present

## 2023-03-31 DIAGNOSIS — N186 End stage renal disease: Secondary | ICD-10-CM | POA: Diagnosis not present

## 2023-03-31 DIAGNOSIS — N2581 Secondary hyperparathyroidism of renal origin: Secondary | ICD-10-CM | POA: Diagnosis not present

## 2023-03-31 DIAGNOSIS — Z992 Dependence on renal dialysis: Secondary | ICD-10-CM | POA: Diagnosis not present

## 2023-04-02 DIAGNOSIS — N2581 Secondary hyperparathyroidism of renal origin: Secondary | ICD-10-CM | POA: Diagnosis not present

## 2023-04-02 DIAGNOSIS — Z992 Dependence on renal dialysis: Secondary | ICD-10-CM | POA: Diagnosis not present

## 2023-04-02 DIAGNOSIS — N186 End stage renal disease: Secondary | ICD-10-CM | POA: Diagnosis not present

## 2023-04-05 DIAGNOSIS — N186 End stage renal disease: Secondary | ICD-10-CM | POA: Diagnosis not present

## 2023-04-05 DIAGNOSIS — N2581 Secondary hyperparathyroidism of renal origin: Secondary | ICD-10-CM | POA: Diagnosis not present

## 2023-04-05 DIAGNOSIS — Z992 Dependence on renal dialysis: Secondary | ICD-10-CM | POA: Diagnosis not present

## 2023-04-06 DIAGNOSIS — N186 End stage renal disease: Secondary | ICD-10-CM | POA: Diagnosis not present

## 2023-04-06 DIAGNOSIS — S72032D Displaced midcervical fracture of left femur, subsequent encounter for closed fracture with routine healing: Secondary | ICD-10-CM | POA: Diagnosis not present

## 2023-04-06 DIAGNOSIS — Z992 Dependence on renal dialysis: Secondary | ICD-10-CM | POA: Diagnosis not present

## 2023-04-06 DIAGNOSIS — I82C22 Chronic embolism and thrombosis of left internal jugular vein: Secondary | ICD-10-CM | POA: Diagnosis not present

## 2023-04-06 DIAGNOSIS — E1122 Type 2 diabetes mellitus with diabetic chronic kidney disease: Secondary | ICD-10-CM | POA: Diagnosis not present

## 2023-04-06 DIAGNOSIS — I1311 Hypertensive heart and chronic kidney disease without heart failure, with stage 5 chronic kidney disease, or end stage renal disease: Secondary | ICD-10-CM | POA: Diagnosis not present

## 2023-04-07 DIAGNOSIS — N2581 Secondary hyperparathyroidism of renal origin: Secondary | ICD-10-CM | POA: Diagnosis not present

## 2023-04-07 DIAGNOSIS — N186 End stage renal disease: Secondary | ICD-10-CM | POA: Diagnosis not present

## 2023-04-07 DIAGNOSIS — Z992 Dependence on renal dialysis: Secondary | ICD-10-CM | POA: Diagnosis not present

## 2023-04-08 DIAGNOSIS — S72002D Fracture of unspecified part of neck of left femur, subsequent encounter for closed fracture with routine healing: Secondary | ICD-10-CM | POA: Diagnosis not present

## 2023-04-08 DIAGNOSIS — I340 Nonrheumatic mitral (valve) insufficiency: Secondary | ICD-10-CM | POA: Diagnosis not present

## 2023-04-08 DIAGNOSIS — M810 Age-related osteoporosis without current pathological fracture: Secondary | ICD-10-CM | POA: Diagnosis not present

## 2023-04-08 DIAGNOSIS — Z992 Dependence on renal dialysis: Secondary | ICD-10-CM | POA: Diagnosis not present

## 2023-04-08 DIAGNOSIS — I10 Essential (primary) hypertension: Secondary | ICD-10-CM | POA: Diagnosis not present

## 2023-04-08 DIAGNOSIS — E1121 Type 2 diabetes mellitus with diabetic nephropathy: Secondary | ICD-10-CM | POA: Diagnosis not present

## 2023-04-08 DIAGNOSIS — S72002A Fracture of unspecified part of neck of left femur, initial encounter for closed fracture: Secondary | ICD-10-CM | POA: Diagnosis not present

## 2023-04-08 DIAGNOSIS — N186 End stage renal disease: Secondary | ICD-10-CM | POA: Diagnosis not present

## 2023-04-09 DIAGNOSIS — Z992 Dependence on renal dialysis: Secondary | ICD-10-CM | POA: Diagnosis not present

## 2023-04-09 DIAGNOSIS — N2581 Secondary hyperparathyroidism of renal origin: Secondary | ICD-10-CM | POA: Diagnosis not present

## 2023-04-09 DIAGNOSIS — N186 End stage renal disease: Secondary | ICD-10-CM | POA: Diagnosis not present

## 2023-04-10 DIAGNOSIS — R9431 Abnormal electrocardiogram [ECG] [EKG]: Secondary | ICD-10-CM | POA: Diagnosis not present

## 2023-04-12 DIAGNOSIS — N2581 Secondary hyperparathyroidism of renal origin: Secondary | ICD-10-CM | POA: Diagnosis not present

## 2023-04-12 DIAGNOSIS — Z992 Dependence on renal dialysis: Secondary | ICD-10-CM | POA: Diagnosis not present

## 2023-04-12 DIAGNOSIS — N186 End stage renal disease: Secondary | ICD-10-CM | POA: Diagnosis not present

## 2023-04-13 DIAGNOSIS — I1311 Hypertensive heart and chronic kidney disease without heart failure, with stage 5 chronic kidney disease, or end stage renal disease: Secondary | ICD-10-CM | POA: Diagnosis not present

## 2023-04-13 DIAGNOSIS — N186 End stage renal disease: Secondary | ICD-10-CM | POA: Diagnosis not present

## 2023-04-13 DIAGNOSIS — Z992 Dependence on renal dialysis: Secondary | ICD-10-CM | POA: Diagnosis not present

## 2023-04-13 DIAGNOSIS — I82C22 Chronic embolism and thrombosis of left internal jugular vein: Secondary | ICD-10-CM | POA: Diagnosis not present

## 2023-04-13 DIAGNOSIS — E1122 Type 2 diabetes mellitus with diabetic chronic kidney disease: Secondary | ICD-10-CM | POA: Diagnosis not present

## 2023-04-13 DIAGNOSIS — S72032D Displaced midcervical fracture of left femur, subsequent encounter for closed fracture with routine healing: Secondary | ICD-10-CM | POA: Diagnosis not present

## 2023-04-14 DIAGNOSIS — N186 End stage renal disease: Secondary | ICD-10-CM | POA: Diagnosis not present

## 2023-04-14 DIAGNOSIS — Z992 Dependence on renal dialysis: Secondary | ICD-10-CM | POA: Diagnosis not present

## 2023-04-14 DIAGNOSIS — Z4789 Encounter for other orthopedic aftercare: Secondary | ICD-10-CM | POA: Diagnosis not present

## 2023-04-14 DIAGNOSIS — N2581 Secondary hyperparathyroidism of renal origin: Secondary | ICD-10-CM | POA: Diagnosis not present

## 2023-04-15 DIAGNOSIS — N186 End stage renal disease: Secondary | ICD-10-CM | POA: Diagnosis not present

## 2023-04-15 DIAGNOSIS — Z992 Dependence on renal dialysis: Secondary | ICD-10-CM | POA: Diagnosis not present

## 2023-04-15 DIAGNOSIS — E1122 Type 2 diabetes mellitus with diabetic chronic kidney disease: Secondary | ICD-10-CM | POA: Diagnosis not present

## 2023-04-15 DIAGNOSIS — I1311 Hypertensive heart and chronic kidney disease without heart failure, with stage 5 chronic kidney disease, or end stage renal disease: Secondary | ICD-10-CM | POA: Diagnosis not present

## 2023-04-15 DIAGNOSIS — S72032D Displaced midcervical fracture of left femur, subsequent encounter for closed fracture with routine healing: Secondary | ICD-10-CM | POA: Diagnosis not present

## 2023-04-15 DIAGNOSIS — I82C22 Chronic embolism and thrombosis of left internal jugular vein: Secondary | ICD-10-CM | POA: Diagnosis not present

## 2023-04-16 DIAGNOSIS — Z992 Dependence on renal dialysis: Secondary | ICD-10-CM | POA: Diagnosis not present

## 2023-04-16 DIAGNOSIS — N186 End stage renal disease: Secondary | ICD-10-CM | POA: Diagnosis not present

## 2023-04-16 DIAGNOSIS — N2581 Secondary hyperparathyroidism of renal origin: Secondary | ICD-10-CM | POA: Diagnosis not present

## 2023-04-17 DIAGNOSIS — S72009A Fracture of unspecified part of neck of unspecified femur, initial encounter for closed fracture: Secondary | ICD-10-CM | POA: Diagnosis not present

## 2023-04-17 DIAGNOSIS — N186 End stage renal disease: Secondary | ICD-10-CM | POA: Diagnosis not present

## 2023-04-17 DIAGNOSIS — R4182 Altered mental status, unspecified: Secondary | ICD-10-CM | POA: Diagnosis not present

## 2023-04-17 DIAGNOSIS — D638 Anemia in other chronic diseases classified elsewhere: Secondary | ICD-10-CM | POA: Diagnosis not present

## 2023-04-19 DIAGNOSIS — N186 End stage renal disease: Secondary | ICD-10-CM | POA: Diagnosis not present

## 2023-04-19 DIAGNOSIS — Z992 Dependence on renal dialysis: Secondary | ICD-10-CM | POA: Diagnosis not present

## 2023-04-19 DIAGNOSIS — N2581 Secondary hyperparathyroidism of renal origin: Secondary | ICD-10-CM | POA: Diagnosis not present

## 2023-04-21 DIAGNOSIS — N2581 Secondary hyperparathyroidism of renal origin: Secondary | ICD-10-CM | POA: Diagnosis not present

## 2023-04-21 DIAGNOSIS — N186 End stage renal disease: Secondary | ICD-10-CM | POA: Diagnosis not present

## 2023-04-21 DIAGNOSIS — Z992 Dependence on renal dialysis: Secondary | ICD-10-CM | POA: Diagnosis not present

## 2023-04-22 DIAGNOSIS — I1311 Hypertensive heart and chronic kidney disease without heart failure, with stage 5 chronic kidney disease, or end stage renal disease: Secondary | ICD-10-CM | POA: Diagnosis not present

## 2023-04-22 DIAGNOSIS — E1122 Type 2 diabetes mellitus with diabetic chronic kidney disease: Secondary | ICD-10-CM | POA: Diagnosis not present

## 2023-04-22 DIAGNOSIS — N186 End stage renal disease: Secondary | ICD-10-CM | POA: Diagnosis not present

## 2023-04-22 DIAGNOSIS — I82C22 Chronic embolism and thrombosis of left internal jugular vein: Secondary | ICD-10-CM | POA: Diagnosis not present

## 2023-04-22 DIAGNOSIS — Z992 Dependence on renal dialysis: Secondary | ICD-10-CM | POA: Diagnosis not present

## 2023-04-22 DIAGNOSIS — S72032D Displaced midcervical fracture of left femur, subsequent encounter for closed fracture with routine healing: Secondary | ICD-10-CM | POA: Diagnosis not present

## 2023-04-23 DIAGNOSIS — N2581 Secondary hyperparathyroidism of renal origin: Secondary | ICD-10-CM | POA: Diagnosis not present

## 2023-04-23 DIAGNOSIS — Z992 Dependence on renal dialysis: Secondary | ICD-10-CM | POA: Diagnosis not present

## 2023-04-23 DIAGNOSIS — N186 End stage renal disease: Secondary | ICD-10-CM | POA: Diagnosis not present

## 2023-04-24 DIAGNOSIS — N186 End stage renal disease: Secondary | ICD-10-CM | POA: Diagnosis not present

## 2023-04-24 DIAGNOSIS — Z992 Dependence on renal dialysis: Secondary | ICD-10-CM | POA: Diagnosis not present

## 2023-04-26 DIAGNOSIS — Z992 Dependence on renal dialysis: Secondary | ICD-10-CM | POA: Diagnosis not present

## 2023-04-26 DIAGNOSIS — N186 End stage renal disease: Secondary | ICD-10-CM | POA: Diagnosis not present

## 2023-04-26 DIAGNOSIS — N2581 Secondary hyperparathyroidism of renal origin: Secondary | ICD-10-CM | POA: Diagnosis not present

## 2023-04-28 DIAGNOSIS — N186 End stage renal disease: Secondary | ICD-10-CM | POA: Diagnosis not present

## 2023-04-28 DIAGNOSIS — Z992 Dependence on renal dialysis: Secondary | ICD-10-CM | POA: Diagnosis not present

## 2023-04-28 DIAGNOSIS — N2581 Secondary hyperparathyroidism of renal origin: Secondary | ICD-10-CM | POA: Diagnosis not present

## 2023-04-29 DIAGNOSIS — S72032D Displaced midcervical fracture of left femur, subsequent encounter for closed fracture with routine healing: Secondary | ICD-10-CM | POA: Diagnosis not present

## 2023-04-29 DIAGNOSIS — Z992 Dependence on renal dialysis: Secondary | ICD-10-CM | POA: Diagnosis not present

## 2023-04-29 DIAGNOSIS — I82C22 Chronic embolism and thrombosis of left internal jugular vein: Secondary | ICD-10-CM | POA: Diagnosis not present

## 2023-04-29 DIAGNOSIS — N186 End stage renal disease: Secondary | ICD-10-CM | POA: Diagnosis not present

## 2023-04-29 DIAGNOSIS — E1122 Type 2 diabetes mellitus with diabetic chronic kidney disease: Secondary | ICD-10-CM | POA: Diagnosis not present

## 2023-04-29 DIAGNOSIS — I1311 Hypertensive heart and chronic kidney disease without heart failure, with stage 5 chronic kidney disease, or end stage renal disease: Secondary | ICD-10-CM | POA: Diagnosis not present

## 2023-04-30 DIAGNOSIS — N2581 Secondary hyperparathyroidism of renal origin: Secondary | ICD-10-CM | POA: Diagnosis not present

## 2023-04-30 DIAGNOSIS — N186 End stage renal disease: Secondary | ICD-10-CM | POA: Diagnosis not present

## 2023-04-30 DIAGNOSIS — Z992 Dependence on renal dialysis: Secondary | ICD-10-CM | POA: Diagnosis not present

## 2023-05-03 DIAGNOSIS — G4489 Other headache syndrome: Secondary | ICD-10-CM | POA: Diagnosis not present

## 2023-05-03 DIAGNOSIS — E1121 Type 2 diabetes mellitus with diabetic nephropathy: Secondary | ICD-10-CM | POA: Diagnosis not present

## 2023-05-03 DIAGNOSIS — R112 Nausea with vomiting, unspecified: Secondary | ICD-10-CM | POA: Diagnosis not present

## 2023-05-03 DIAGNOSIS — R251 Tremor, unspecified: Secondary | ICD-10-CM | POA: Diagnosis not present

## 2023-05-03 DIAGNOSIS — F8081 Childhood onset fluency disorder: Secondary | ICD-10-CM | POA: Diagnosis not present

## 2023-05-04 DIAGNOSIS — R112 Nausea with vomiting, unspecified: Secondary | ICD-10-CM | POA: Diagnosis not present

## 2023-05-05 DIAGNOSIS — N2581 Secondary hyperparathyroidism of renal origin: Secondary | ICD-10-CM | POA: Diagnosis not present

## 2023-05-05 DIAGNOSIS — N186 End stage renal disease: Secondary | ICD-10-CM | POA: Diagnosis not present

## 2023-05-05 DIAGNOSIS — Z992 Dependence on renal dialysis: Secondary | ICD-10-CM | POA: Diagnosis not present

## 2023-05-06 DIAGNOSIS — I82C22 Chronic embolism and thrombosis of left internal jugular vein: Secondary | ICD-10-CM | POA: Diagnosis not present

## 2023-05-06 DIAGNOSIS — Z992 Dependence on renal dialysis: Secondary | ICD-10-CM | POA: Diagnosis not present

## 2023-05-06 DIAGNOSIS — E1122 Type 2 diabetes mellitus with diabetic chronic kidney disease: Secondary | ICD-10-CM | POA: Diagnosis not present

## 2023-05-06 DIAGNOSIS — I1311 Hypertensive heart and chronic kidney disease without heart failure, with stage 5 chronic kidney disease, or end stage renal disease: Secondary | ICD-10-CM | POA: Diagnosis not present

## 2023-05-06 DIAGNOSIS — N186 End stage renal disease: Secondary | ICD-10-CM | POA: Diagnosis not present

## 2023-05-06 DIAGNOSIS — S72032D Displaced midcervical fracture of left femur, subsequent encounter for closed fracture with routine healing: Secondary | ICD-10-CM | POA: Diagnosis not present

## 2023-05-07 DIAGNOSIS — Z992 Dependence on renal dialysis: Secondary | ICD-10-CM | POA: Diagnosis not present

## 2023-05-07 DIAGNOSIS — N186 End stage renal disease: Secondary | ICD-10-CM | POA: Diagnosis not present

## 2023-05-07 DIAGNOSIS — N2581 Secondary hyperparathyroidism of renal origin: Secondary | ICD-10-CM | POA: Diagnosis not present

## 2023-05-10 DIAGNOSIS — N186 End stage renal disease: Secondary | ICD-10-CM | POA: Diagnosis not present

## 2023-05-10 DIAGNOSIS — Z992 Dependence on renal dialysis: Secondary | ICD-10-CM | POA: Diagnosis not present

## 2023-05-10 DIAGNOSIS — N2581 Secondary hyperparathyroidism of renal origin: Secondary | ICD-10-CM | POA: Diagnosis not present

## 2023-05-12 DIAGNOSIS — Z79899 Other long term (current) drug therapy: Secondary | ICD-10-CM | POA: Diagnosis not present

## 2023-05-12 DIAGNOSIS — I10 Essential (primary) hypertension: Secondary | ICD-10-CM | POA: Diagnosis not present

## 2023-05-12 DIAGNOSIS — R102 Pelvic and perineal pain: Secondary | ICD-10-CM | POA: Diagnosis not present

## 2023-05-12 DIAGNOSIS — R051 Acute cough: Secondary | ICD-10-CM | POA: Diagnosis not present

## 2023-05-12 DIAGNOSIS — N186 End stage renal disease: Secondary | ICD-10-CM | POA: Diagnosis not present

## 2023-05-12 DIAGNOSIS — R110 Nausea: Secondary | ICD-10-CM | POA: Diagnosis not present

## 2023-05-12 DIAGNOSIS — R519 Headache, unspecified: Secondary | ICD-10-CM | POA: Diagnosis not present

## 2023-05-12 DIAGNOSIS — Z992 Dependence on renal dialysis: Secondary | ICD-10-CM | POA: Diagnosis not present

## 2023-05-12 DIAGNOSIS — Z5321 Procedure and treatment not carried out due to patient leaving prior to being seen by health care provider: Secondary | ICD-10-CM | POA: Diagnosis not present

## 2023-05-12 DIAGNOSIS — R69 Illness, unspecified: Secondary | ICD-10-CM | POA: Diagnosis not present

## 2023-05-12 DIAGNOSIS — R1030 Lower abdominal pain, unspecified: Secondary | ICD-10-CM | POA: Diagnosis not present

## 2023-05-12 DIAGNOSIS — E119 Type 2 diabetes mellitus without complications: Secondary | ICD-10-CM | POA: Diagnosis not present

## 2023-05-12 DIAGNOSIS — N2581 Secondary hyperparathyroidism of renal origin: Secondary | ICD-10-CM | POA: Diagnosis not present

## 2023-05-14 DIAGNOSIS — Z992 Dependence on renal dialysis: Secondary | ICD-10-CM | POA: Diagnosis not present

## 2023-05-14 DIAGNOSIS — N2581 Secondary hyperparathyroidism of renal origin: Secondary | ICD-10-CM | POA: Diagnosis not present

## 2023-05-14 DIAGNOSIS — N186 End stage renal disease: Secondary | ICD-10-CM | POA: Diagnosis not present

## 2023-05-15 DIAGNOSIS — E119 Type 2 diabetes mellitus without complications: Secondary | ICD-10-CM | POA: Diagnosis not present

## 2023-05-15 DIAGNOSIS — G44209 Tension-type headache, unspecified, not intractable: Secondary | ICD-10-CM | POA: Diagnosis not present

## 2023-05-15 DIAGNOSIS — I10 Essential (primary) hypertension: Secondary | ICD-10-CM | POA: Diagnosis not present

## 2023-05-15 DIAGNOSIS — Z79899 Other long term (current) drug therapy: Secondary | ICD-10-CM | POA: Diagnosis not present

## 2023-05-17 DIAGNOSIS — N2581 Secondary hyperparathyroidism of renal origin: Secondary | ICD-10-CM | POA: Diagnosis not present

## 2023-05-17 DIAGNOSIS — Z992 Dependence on renal dialysis: Secondary | ICD-10-CM | POA: Diagnosis not present

## 2023-05-17 DIAGNOSIS — N186 End stage renal disease: Secondary | ICD-10-CM | POA: Diagnosis not present

## 2023-05-17 DIAGNOSIS — R519 Headache, unspecified: Secondary | ICD-10-CM | POA: Diagnosis not present

## 2023-05-17 DIAGNOSIS — I10 Essential (primary) hypertension: Secondary | ICD-10-CM | POA: Diagnosis not present

## 2023-05-17 DIAGNOSIS — Z6822 Body mass index (BMI) 22.0-22.9, adult: Secondary | ICD-10-CM | POA: Diagnosis not present

## 2023-05-17 DIAGNOSIS — I700 Atherosclerosis of aorta: Secondary | ICD-10-CM | POA: Diagnosis not present

## 2023-05-19 DIAGNOSIS — N186 End stage renal disease: Secondary | ICD-10-CM | POA: Diagnosis not present

## 2023-05-19 DIAGNOSIS — Z992 Dependence on renal dialysis: Secondary | ICD-10-CM | POA: Diagnosis not present

## 2023-05-19 DIAGNOSIS — N2581 Secondary hyperparathyroidism of renal origin: Secondary | ICD-10-CM | POA: Diagnosis not present

## 2023-05-21 DIAGNOSIS — N186 End stage renal disease: Secondary | ICD-10-CM | POA: Diagnosis not present

## 2023-05-21 DIAGNOSIS — N2581 Secondary hyperparathyroidism of renal origin: Secondary | ICD-10-CM | POA: Diagnosis not present

## 2023-05-21 DIAGNOSIS — Z992 Dependence on renal dialysis: Secondary | ICD-10-CM | POA: Diagnosis not present

## 2023-05-23 DIAGNOSIS — K219 Gastro-esophageal reflux disease without esophagitis: Secondary | ICD-10-CM | POA: Diagnosis not present

## 2023-05-23 DIAGNOSIS — N186 End stage renal disease: Secondary | ICD-10-CM | POA: Diagnosis not present

## 2023-05-23 DIAGNOSIS — E1122 Type 2 diabetes mellitus with diabetic chronic kidney disease: Secondary | ICD-10-CM | POA: Diagnosis not present

## 2023-05-23 DIAGNOSIS — E782 Mixed hyperlipidemia: Secondary | ICD-10-CM | POA: Diagnosis not present

## 2023-05-24 DIAGNOSIS — Z992 Dependence on renal dialysis: Secondary | ICD-10-CM | POA: Diagnosis not present

## 2023-05-24 DIAGNOSIS — N186 End stage renal disease: Secondary | ICD-10-CM | POA: Diagnosis not present

## 2023-05-26 DIAGNOSIS — N186 End stage renal disease: Secondary | ICD-10-CM | POA: Diagnosis not present

## 2023-05-26 DIAGNOSIS — Z992 Dependence on renal dialysis: Secondary | ICD-10-CM | POA: Diagnosis not present

## 2023-05-28 DIAGNOSIS — Z992 Dependence on renal dialysis: Secondary | ICD-10-CM | POA: Diagnosis not present

## 2023-05-28 DIAGNOSIS — N186 End stage renal disease: Secondary | ICD-10-CM | POA: Diagnosis not present

## 2023-05-29 DIAGNOSIS — I10 Essential (primary) hypertension: Secondary | ICD-10-CM | POA: Diagnosis not present

## 2023-05-29 DIAGNOSIS — R519 Headache, unspecified: Secondary | ICD-10-CM | POA: Diagnosis not present

## 2023-05-29 DIAGNOSIS — E119 Type 2 diabetes mellitus without complications: Secondary | ICD-10-CM | POA: Diagnosis not present

## 2023-05-29 DIAGNOSIS — Z7902 Long term (current) use of antithrombotics/antiplatelets: Secondary | ICD-10-CM | POA: Diagnosis not present

## 2023-05-29 DIAGNOSIS — G8929 Other chronic pain: Secondary | ICD-10-CM | POA: Diagnosis not present

## 2023-05-29 DIAGNOSIS — Z7901 Long term (current) use of anticoagulants: Secondary | ICD-10-CM | POA: Diagnosis not present

## 2023-05-29 DIAGNOSIS — Z79899 Other long term (current) drug therapy: Secondary | ICD-10-CM | POA: Diagnosis not present

## 2023-05-31 DIAGNOSIS — Z992 Dependence on renal dialysis: Secondary | ICD-10-CM | POA: Diagnosis not present

## 2023-05-31 DIAGNOSIS — N186 End stage renal disease: Secondary | ICD-10-CM | POA: Diagnosis not present

## 2023-06-02 DIAGNOSIS — N186 End stage renal disease: Secondary | ICD-10-CM | POA: Diagnosis not present

## 2023-06-02 DIAGNOSIS — Z992 Dependence on renal dialysis: Secondary | ICD-10-CM | POA: Diagnosis not present

## 2023-06-04 DIAGNOSIS — N186 End stage renal disease: Secondary | ICD-10-CM | POA: Diagnosis not present

## 2023-06-04 DIAGNOSIS — Z992 Dependence on renal dialysis: Secondary | ICD-10-CM | POA: Diagnosis not present

## 2023-06-06 ENCOUNTER — Other Ambulatory Visit: Payer: Self-pay

## 2023-06-06 ENCOUNTER — Emergency Department (HOSPITAL_COMMUNITY): Payer: Medicare PPO

## 2023-06-06 ENCOUNTER — Encounter (HOSPITAL_COMMUNITY): Payer: Self-pay | Admitting: Emergency Medicine

## 2023-06-06 ENCOUNTER — Emergency Department (HOSPITAL_COMMUNITY)
Admission: EM | Admit: 2023-06-06 | Discharge: 2023-06-06 | Disposition: A | Discharge status: Home / Self Care | Payer: Medicare PPO | Attending: Emergency Medicine | Admitting: Emergency Medicine

## 2023-06-06 DIAGNOSIS — N186 End stage renal disease: Secondary | ICD-10-CM | POA: Diagnosis not present

## 2023-06-06 DIAGNOSIS — I1A0 Resistant hypertension: Secondary | ICD-10-CM | POA: Diagnosis not present

## 2023-06-06 DIAGNOSIS — I120 Hypertensive chronic kidney disease with stage 5 chronic kidney disease or end stage renal disease: Secondary | ICD-10-CM | POA: Insufficient documentation

## 2023-06-06 DIAGNOSIS — I700 Atherosclerosis of aorta: Secondary | ICD-10-CM | POA: Diagnosis not present

## 2023-06-06 DIAGNOSIS — R011 Cardiac murmur, unspecified: Secondary | ICD-10-CM | POA: Diagnosis not present

## 2023-06-06 DIAGNOSIS — R4182 Altered mental status, unspecified: Secondary | ICD-10-CM | POA: Diagnosis not present

## 2023-06-06 DIAGNOSIS — Z7901 Long term (current) use of anticoagulants: Secondary | ICD-10-CM | POA: Diagnosis not present

## 2023-06-06 DIAGNOSIS — Z20822 Contact with and (suspected) exposure to covid-19: Secondary | ICD-10-CM | POA: Insufficient documentation

## 2023-06-06 DIAGNOSIS — R410 Disorientation, unspecified: Secondary | ICD-10-CM | POA: Insufficient documentation

## 2023-06-06 DIAGNOSIS — Z79899 Other long term (current) drug therapy: Secondary | ICD-10-CM | POA: Insufficient documentation

## 2023-06-06 DIAGNOSIS — Z992 Dependence on renal dialysis: Secondary | ICD-10-CM | POA: Insufficient documentation

## 2023-06-06 DIAGNOSIS — R059 Cough, unspecified: Secondary | ICD-10-CM | POA: Diagnosis not present

## 2023-06-06 LAB — COMPREHENSIVE METABOLIC PANEL
ALT: 11 U/L (ref 0–44)
AST: 15 U/L (ref 15–41)
Albumin: 3.5 g/dL (ref 3.5–5.0)
Alkaline Phosphatase: 65 U/L (ref 38–126)
Anion gap: 14 (ref 5–15)
BUN: 38 mg/dL — ABNORMAL HIGH (ref 8–23)
CO2: 22 mmol/L (ref 22–32)
Calcium: 8.3 mg/dL — ABNORMAL LOW (ref 8.9–10.3)
Chloride: 102 mmol/L (ref 98–111)
Creatinine, Ser: 8.09 mg/dL — ABNORMAL HIGH (ref 0.44–1.00)
GFR, Estimated: 5 mL/min — ABNORMAL LOW (ref >60)
Glucose, Bld: 141 mg/dL — ABNORMAL HIGH (ref 70–99)
Potassium: 5.2 mmol/L — ABNORMAL HIGH (ref 3.5–5.1)
Sodium: 138 mmol/L (ref 135–145)
Total Bilirubin: 0.8 mg/dL (ref 0.3–1.2)
Total Protein: 7.1 g/dL (ref 6.5–8.1)

## 2023-06-06 LAB — CBC
HCT: 32.7 % — ABNORMAL LOW (ref 36.0–46.0)
Hemoglobin: 10.4 g/dL — ABNORMAL LOW (ref 12.0–15.0)
MCH: 26.9 pg (ref 26.0–34.0)
MCHC: 31.8 g/dL (ref 30.0–36.0)
MCV: 84.5 fL (ref 80.0–100.0)
Platelets: 160 10*3/uL (ref 150–400)
RBC: 3.87 MIL/uL (ref 3.87–5.11)
RDW: 17.4 % — ABNORMAL HIGH (ref 11.5–15.5)
WBC: 6.8 10*3/uL (ref 4.0–10.5)
nRBC: 0 % (ref 0.0–0.2)

## 2023-06-06 LAB — URINALYSIS, ROUTINE W REFLEX MICROSCOPIC
Bilirubin Urine: NEGATIVE
Glucose, UA: 50 mg/dL — AB
Hgb urine dipstick: NEGATIVE
Ketones, ur: NEGATIVE mg/dL
Leukocytes,Ua: NEGATIVE
Nitrite: NEGATIVE
Protein, ur: >=300 mg/dL — AB
Specific Gravity, Urine: 1.014 (ref 1.005–1.030)
Squamous Epithelial / HPF: >50 /HPF (ref 0–5)
Trans Epithel, UA: 1
pH: 8 (ref 5.0–8.0)

## 2023-06-06 LAB — CBG MONITORING, ED: Glucose-Capillary: 114 mg/dL — ABNORMAL HIGH (ref 70–99)

## 2023-06-06 LAB — ETHANOL: Alcohol, Ethyl (B): <10 mg/dL (ref <10)

## 2023-06-06 LAB — PROTIME-INR
INR: 1.2 (ref 0.8–1.2)
Prothrombin Time: 14.9 seconds (ref 11.4–15.2)

## 2023-06-06 LAB — AMMONIA: Ammonia: <10 umol/L (ref 9–35)

## 2023-06-06 LAB — SARS CORONAVIRUS 2 BY RT PCR: SARS Coronavirus 2 by RT PCR: NEGATIVE

## 2023-06-06 MED ORDER — ISOSORBIDE MONONITRATE ER 60 MG PO TB24
60.0000 mg | ORAL_TABLET | Freq: Every day | ORAL | Status: DC
  Administered 2023-06-06: 60 mg via ORAL
  Filled 2023-06-06: qty 1

## 2023-06-06 MED ORDER — CARVEDILOL 12.5 MG PO TABS
12.5000 mg | ORAL_TABLET | Freq: Two times a day (BID) | ORAL | Status: DC
  Filled 2023-06-06: qty 1

## 2023-06-06 NOTE — ED Provider Notes (Signed)
[place] EMERGENCY DEPARTMENT AT [facility] Provider Note   CSN: [id number] Arrival date & time: 06/06/23  1421     History  Chief Complaint  Patient presents with   Altered Mental Status    [name] is a 72 y.o. female.  She is brought in by her son for evaluation of altered mental status.  Level 5 caveat secondary to altered mental status.  She has a history of end-stage renal disease dialysis Monday Wednesday Friday, dialysis, stroke hypertension.  He said he does not think she has been taking her meds for over a week maybe longer.  Has been going to dialysis and last went on Friday.  She has been having ongoing headaches for the last few months.  He said she has been getting progressively confused and is now wandering around the house does not know what she is doing.  She has had a nonproductive cough and has vomited a few times.  She was last in the hospital about a week ago for headache without clear etiology identified.  The history is provided by the patient and a relative.  Altered Mental Status Presenting symptoms: confusion and disorientation   Severity:  Moderate Most recent episode:  More than 2 days ago Episode history:  Continuous Timing:  Constant Progression:  Unchanged Chronicity:  New Context: not taking medications as prescribed   Associated symptoms: headaches, nausea and vomiting   Associated symptoms: no abdominal pain, no difficulty breathing and no fever        Home Medications Prior to Admission medications   Medication Sig Start Date End Date Taking? Authorizing Provider  acetaminophen (TYLENOL) 325 MG tablet Take 2 tablets (650 mg total) by mouth every 12 (twelve) hours as needed for mild pain (or temp > 37.5 C (99.5 F)). Patient taking differently: Take 1,300 mg by mouth every 12 (twelve) hours as needed for mild pain (or temp > 37.5 C (99.5 F)). 12/04/19   Angiulli, Mcarthur Rossetti, PA-C  alendronate (FOSAMAX) 70 MG tablet Take 70 mg by  mouth once a week. 12/16/22   [provider]  amLODipine (NORVASC) 10 MG tablet Take by mouth. 10/12/19   [provider]  apixaban (ELIQUIS) 5 MG TABS tablet Take 1 tablet (5 mg total) by mouth 2 (two) times daily. 12/28/19   Raulkar, Drema Pry, MD  carvedilol (COREG) 12.5 MG tablet Take 1 tablet (12.5 mg total) by mouth 2 (two) times daily. 12/19/19   Raulkar, Drema Pry, MD  diclofenac Sodium (VOLTAREN) 1 % GEL Apply 2 g topically 4 (four) times daily. 03/14/20   Raulkar, Drema Pry, MD  isosorbide mononitrate (IMDUR) 60 MG 24 hr tablet Take 1 tablet (60 mg total) by mouth daily. 12/28/19   Raulkar, Drema Pry, MD  midodrine (PROAMATINE) 10 MG tablet Take 10 mg by mouth daily. 12/16/22   [provider]  pantoprazole (PROTONIX) 40 MG tablet Take 1 tablet (40 mg total) by mouth daily. 12/05/19   Angiulli, Mcarthur Rossetti, PA-C      Allergies    Patient has no known allergies.    Review of Systems   Review of Systems  Constitutional:  Negative for fever.  Eyes:  Negative for visual disturbance.  Respiratory:  Positive for cough. Negative for shortness of breath.   Cardiovascular:  Negative for chest pain.  Gastrointestinal:  Positive for nausea and vomiting. Negative for abdominal pain.  Genitourinary:  Negative for dysuria.  Neurological:  Positive for headaches.  Psychiatric/Behavioral:  Positive  for confusion.     Physical Exam Updated Vital Signs BP (!) 165/111 (BP Location: Right Arm)   Pulse 87   Temp 99 F (37.2 C) (Oral)   Resp 20   Ht 5\' 7"  (1.702 m)   Wt 70.3 kg   SpO2 98%   BMI 24.28 kg/m  Physical Exam Vitals and nursing note reviewed.  Constitutional:      General: She is not in acute distress.    Appearance: Normal appearance. She is well-developed.  HENT:     Head: Normocephalic and atraumatic.  Eyes:     Conjunctiva/sclera: Conjunctivae normal.  Cardiovascular:     Rate and Rhythm: Normal rate and regular rhythm.     Heart sounds: Murmur  heard.     Comments: She has dialysis catheter right upper chest without surrounding erythema Pulmonary:     Effort: Pulmonary effort is normal. No respiratory distress.     Breath sounds: Normal breath sounds.  Abdominal:     Palpations: Abdomen is soft.     Tenderness: There is no abdominal tenderness. There is no guarding or rebound.  Musculoskeletal:        General: No deformity. Normal range of motion.     Cervical back: Neck supple.  Skin:    General: Skin is warm and dry.     Capillary Refill: Capillary refill takes less than 2 seconds.  Neurological:     General: No focal deficit present.     Mental Status: She is alert. She is disoriented.     Sensory: No sensory deficit.     Motor: No weakness.     ED Results / Procedures / Treatments   Labs (all labs ordered are listed, but only abnormal results are displayed) Labs Reviewed  COMPREHENSIVE METABOLIC PANEL - Abnormal; Notable for the following components:      Result Value   Potassium 5.2 (*)    Glucose, Bld 141 (*)    BUN 38 (*)    Creatinine, Ser 8.09 (*)    Calcium 8.3 (*)    GFR, Estimated 5 (*)    All other components within normal limits  CBC - Abnormal; Notable for the following components:   Hemoglobin 10.4 (*)    HCT 32.7 (*)    RDW 17.4 (*)    All other components within normal limits  URINALYSIS, ROUTINE W REFLEX MICROSCOPIC - Abnormal; Notable for the following components:   APPearance CLOUDY (*)    Glucose, UA 50 (*)    Protein, ur >=300 (*)    Bacteria, UA RARE (*)    All other components within normal limits  CBG MONITORING, ED - Abnormal; Notable for the following components:   Glucose-Capillary 114 (*)    All other components within normal limits  SARS CORONAVIRUS 2 BY RT PCR  PROTIME-INR  ETHANOL  AMMONIA    EKG EKG Interpretation Date/Time:  Sunday June 06 2023 15:15:28 EDT Ventricular Rate:  93 PR Interval:  176 QRS Duration:  81 QT Interval:  372 QTC Calculation: 463 R  Axis:   100  Text Interpretation: Sinus rhythm Right axis deviation Low voltage, precordial leads Baseline wander in lead(s) V1 No significant change since prior 3/24 Confirmed by Meridee Score [phone]) on 06/06/2023 3:23:16 PM  Radiology CT Head Wo Contrast  Result Date: 06/06/2023 CLINICAL DATA:  Altered mental status EXAM: CT HEAD WITHOUT CONTRAST TECHNIQUE: Contiguous axial images were obtained from the base of the skull through the vertex without intravenous contrast. RADIATION  DOSE REDUCTION: This exam was performed according to the departmental dose-optimization program which includes automated exposure control, adjustment of the mA and/or kV according to patient size and/or use of iterative reconstruction technique. COMPARISON:  05/29/2023 FINDINGS: Brain: No acute intracranial findings are seen. There are no signs of bleeding within the cranium. Cortical sulci are prominent. There is decreased density in periventricular and subcortical white matter. Vascular: Unremarkable. Skull: No acute findings are seen. Sinuses/Orbits: There is mild mucosal thickening in ethmoid sinus. Other: There is increased amount of CSF in sella suggesting partially empty sella. IMPRESSION: No acute intracranial findings are seen in noncontrast CT brain. Atrophy. Small vessel disease. Electronically Signed   By: Ernie Avena M.D.   On: 06/06/2023 19:40   DG Chest Port 1 View  Result Date: 06/06/2023 CLINICAL DATA:  Cough and disorientation EXAM: PORTABLE CHEST 1 VIEW COMPARISON:  Radiographs 05/31/2020 FINDINGS: Stable cardiomediastinal silhouette. Aortic atherosclerotic calcification. Right IJ CVC tip in the right atrium. Bilateral interstitial coarsening may be due to mild edema or atypical infection. No pleural effusion or pneumothorax. IMPRESSION: Bilateral interstitial coarsening may be due to mild edema or atypical infection. Electronically Signed   By: Minerva Fester M.D.   On: 06/06/2023 18:21     Procedures Procedures    Medications Ordered in ED Medications - No data to display  ED Course/ Medical Decision Making/ A&P Clinical Course as of 06/07/23 0841  Sun Jun 06, 2023  1814 Chest x-ray interpreted by me as probable interstitial edema no gross infiltrate.  Awaiting radiology reading. [MB]  2016 Reviewed results with her son.  He said he would rather take her home.  She still has not given a urine sample and she does make urine so we will try to check that.  He will bring her to dialysis tomorrow and he is already got her set up for outpatient MRI.  He feels she is back to her baseline at present. [MB]    Clinical Course User Index [MB] Terrilee Files, MD                             Medical Decision Making Amount and/or Complexity of Data Reviewed Labs: ordered. Radiology: ordered.   This patient complains of elevated blood pressure headache confusion not taking her medicines; this involves an extensive number of treatment Options and is a complaint that carries with it a high risk of complications and morbidity. The differential includes sepsis, Sirs, stroke, bleed, infection, metabolic derangement,  I ordered, reviewed and interpreted labs, which included CBC with normal white count stable hemoglobin, chemistries with mildly elevated potassium function consistent with end-stage renal disease, urinalysis without clear signs of infection, COVID-negative, ammonia negative I ordered imaging studies which included chest x-ray head CT and I independently    visualized and interpreted imaging which showed possible mild edema Additional history obtained from patient's son Previous records obtained and reviewed in epic including recent ED visits to [facility]  Cardiac monitoring reviewed, normal sinus rhythm Social determinants considered, no significant barriers Critical Interventions: None  After the interventions stated above, I reevaluated the patient and found  patient to be more awake alert in no distress.  Blood pressure remains high.  Ordered some of her home blood pressure medicines. Admission and further testing considered, felt patient would benefit from mission the hospital for further workup.  Patient's son states he would rather take her home.  His doctor has been working  on getting her an MRI and he is going to encourage patient to take her blood pressure medicine.  He understands to bring her back if there is any worsening of the condition or any new concerns.         Final Clinical Impression(s) / ED Diagnoses Final diagnoses:  Confusion  Resistant hypertension  End stage renal disease (HCC)    Rx / DC Orders ED Discharge Orders     None         Terrilee Files, MD 06/07/23 [phone]

## 2023-06-06 NOTE — Progress Notes (Signed)
EDP was called regarding consult on this patient.  However, family was going to take patient home and patient does not need to be admitted at this time per EDP.

## 2023-06-06 NOTE — Discharge Instructions (Signed)
You were seen in the emergency department for episodes of confusion, elevated blood pressure not taking her medications.  You had a CAT scan of your head along with blood work EKG.  You were given blood pressure medications.  Please take your medication and follow-up with your primary care doctor.  We discussed admission at the hospital and you and your son felt you were okay to follow-up as an outpatient.  Please get dialysis tomorrow as scheduled.  Return to the emergency department if any worsening or concerning symptoms

## 2023-06-06 NOTE — ED Triage Notes (Signed)
Pt via POV with son who reports that pt has been very disoriented since last night and has not been taking her medications for at least a week. Pt has been confused, wandering around the house, and just not acting normal. LKW yesterday morning.

## 2023-06-06 NOTE — ED Notes (Signed)
Pt ambulated to the bathroom with 1 man assist. Pt attempting to provide a urine sample at this time

## 2023-06-07 DIAGNOSIS — N186 End stage renal disease: Secondary | ICD-10-CM | POA: Diagnosis not present

## 2023-06-07 DIAGNOSIS — Z992 Dependence on renal dialysis: Secondary | ICD-10-CM | POA: Diagnosis not present

## 2023-06-09 DIAGNOSIS — Z992 Dependence on renal dialysis: Secondary | ICD-10-CM | POA: Diagnosis not present

## 2023-06-09 DIAGNOSIS — N186 End stage renal disease: Secondary | ICD-10-CM | POA: Diagnosis not present

## 2023-06-10 DIAGNOSIS — R519 Headache, unspecified: Secondary | ICD-10-CM | POA: Diagnosis not present

## 2023-06-10 DIAGNOSIS — I700 Atherosclerosis of aorta: Secondary | ICD-10-CM | POA: Diagnosis not present

## 2023-06-10 DIAGNOSIS — Z6821 Body mass index (BMI) 21.0-21.9, adult: Secondary | ICD-10-CM | POA: Diagnosis not present

## 2023-06-10 DIAGNOSIS — N186 End stage renal disease: Secondary | ICD-10-CM | POA: Diagnosis not present

## 2023-06-10 DIAGNOSIS — I10 Essential (primary) hypertension: Secondary | ICD-10-CM | POA: Diagnosis not present

## 2023-06-11 DIAGNOSIS — Z992 Dependence on renal dialysis: Secondary | ICD-10-CM | POA: Diagnosis not present

## 2023-06-11 DIAGNOSIS — N186 End stage renal disease: Secondary | ICD-10-CM | POA: Diagnosis not present

## 2023-06-14 DIAGNOSIS — Z992 Dependence on renal dialysis: Secondary | ICD-10-CM | POA: Diagnosis not present

## 2023-06-14 DIAGNOSIS — N186 End stage renal disease: Secondary | ICD-10-CM | POA: Diagnosis not present

## 2023-06-16 DIAGNOSIS — N186 End stage renal disease: Secondary | ICD-10-CM | POA: Diagnosis not present

## 2023-06-16 DIAGNOSIS — Z992 Dependence on renal dialysis: Secondary | ICD-10-CM | POA: Diagnosis not present

## 2023-06-18 DIAGNOSIS — Z992 Dependence on renal dialysis: Secondary | ICD-10-CM | POA: Diagnosis not present

## 2023-06-18 DIAGNOSIS — N186 End stage renal disease: Secondary | ICD-10-CM | POA: Diagnosis not present

## 2023-06-21 DIAGNOSIS — Z992 Dependence on renal dialysis: Secondary | ICD-10-CM | POA: Diagnosis not present

## 2023-06-21 DIAGNOSIS — N186 End stage renal disease: Secondary | ICD-10-CM | POA: Diagnosis not present

## 2023-06-23 DIAGNOSIS — N186 End stage renal disease: Secondary | ICD-10-CM | POA: Diagnosis not present

## 2023-06-23 DIAGNOSIS — Z992 Dependence on renal dialysis: Secondary | ICD-10-CM | POA: Diagnosis not present

## 2023-06-24 DIAGNOSIS — Z992 Dependence on renal dialysis: Secondary | ICD-10-CM | POA: Diagnosis not present

## 2023-06-24 DIAGNOSIS — N186 End stage renal disease: Secondary | ICD-10-CM | POA: Diagnosis not present

## 2023-06-25 DIAGNOSIS — N2581 Secondary hyperparathyroidism of renal origin: Secondary | ICD-10-CM | POA: Diagnosis not present

## 2023-06-25 DIAGNOSIS — Z992 Dependence on renal dialysis: Secondary | ICD-10-CM | POA: Diagnosis not present

## 2023-06-25 DIAGNOSIS — N186 End stage renal disease: Secondary | ICD-10-CM | POA: Diagnosis not present

## 2023-06-28 DIAGNOSIS — N2581 Secondary hyperparathyroidism of renal origin: Secondary | ICD-10-CM | POA: Diagnosis not present

## 2023-06-28 DIAGNOSIS — N186 End stage renal disease: Secondary | ICD-10-CM | POA: Diagnosis not present

## 2023-06-28 DIAGNOSIS — Z992 Dependence on renal dialysis: Secondary | ICD-10-CM | POA: Diagnosis not present

## 2023-06-30 DIAGNOSIS — N186 End stage renal disease: Secondary | ICD-10-CM | POA: Diagnosis not present

## 2023-06-30 DIAGNOSIS — N2581 Secondary hyperparathyroidism of renal origin: Secondary | ICD-10-CM | POA: Diagnosis not present

## 2023-06-30 DIAGNOSIS — Z992 Dependence on renal dialysis: Secondary | ICD-10-CM | POA: Diagnosis not present

## 2023-07-02 DIAGNOSIS — N186 End stage renal disease: Secondary | ICD-10-CM | POA: Diagnosis not present

## 2023-07-02 DIAGNOSIS — N2581 Secondary hyperparathyroidism of renal origin: Secondary | ICD-10-CM | POA: Diagnosis not present

## 2023-07-02 DIAGNOSIS — Z992 Dependence on renal dialysis: Secondary | ICD-10-CM | POA: Diagnosis not present

## 2023-07-05 DIAGNOSIS — Z992 Dependence on renal dialysis: Secondary | ICD-10-CM | POA: Diagnosis not present

## 2023-07-05 DIAGNOSIS — N2581 Secondary hyperparathyroidism of renal origin: Secondary | ICD-10-CM | POA: Diagnosis not present

## 2023-07-05 DIAGNOSIS — N186 End stage renal disease: Secondary | ICD-10-CM | POA: Diagnosis not present

## 2023-07-07 DIAGNOSIS — N186 End stage renal disease: Secondary | ICD-10-CM | POA: Diagnosis not present

## 2023-07-07 DIAGNOSIS — Z992 Dependence on renal dialysis: Secondary | ICD-10-CM | POA: Diagnosis not present

## 2023-07-07 DIAGNOSIS — N2581 Secondary hyperparathyroidism of renal origin: Secondary | ICD-10-CM | POA: Diagnosis not present

## 2023-07-09 DIAGNOSIS — N186 End stage renal disease: Secondary | ICD-10-CM | POA: Diagnosis not present

## 2023-07-09 DIAGNOSIS — N2581 Secondary hyperparathyroidism of renal origin: Secondary | ICD-10-CM | POA: Diagnosis not present

## 2023-07-09 DIAGNOSIS — Z992 Dependence on renal dialysis: Secondary | ICD-10-CM | POA: Diagnosis not present

## 2023-07-12 DIAGNOSIS — Z992 Dependence on renal dialysis: Secondary | ICD-10-CM | POA: Diagnosis not present

## 2023-07-12 DIAGNOSIS — N186 End stage renal disease: Secondary | ICD-10-CM | POA: Diagnosis not present

## 2023-07-12 DIAGNOSIS — N2581 Secondary hyperparathyroidism of renal origin: Secondary | ICD-10-CM | POA: Diagnosis not present

## 2023-07-14 DIAGNOSIS — N2581 Secondary hyperparathyroidism of renal origin: Secondary | ICD-10-CM | POA: Diagnosis not present

## 2023-07-14 DIAGNOSIS — N186 End stage renal disease: Secondary | ICD-10-CM | POA: Diagnosis not present

## 2023-07-14 DIAGNOSIS — Z992 Dependence on renal dialysis: Secondary | ICD-10-CM | POA: Diagnosis not present

## 2023-07-16 DIAGNOSIS — N2581 Secondary hyperparathyroidism of renal origin: Secondary | ICD-10-CM | POA: Diagnosis not present

## 2023-07-16 DIAGNOSIS — N186 End stage renal disease: Secondary | ICD-10-CM | POA: Diagnosis not present

## 2023-07-16 DIAGNOSIS — Z992 Dependence on renal dialysis: Secondary | ICD-10-CM | POA: Diagnosis not present

## 2023-07-19 DIAGNOSIS — N186 End stage renal disease: Secondary | ICD-10-CM | POA: Diagnosis not present

## 2023-07-19 DIAGNOSIS — N2581 Secondary hyperparathyroidism of renal origin: Secondary | ICD-10-CM | POA: Diagnosis not present

## 2023-07-19 DIAGNOSIS — Z992 Dependence on renal dialysis: Secondary | ICD-10-CM | POA: Diagnosis not present

## 2023-07-20 DIAGNOSIS — E113393 Type 2 diabetes mellitus with moderate nonproliferative diabetic retinopathy without macular edema, bilateral: Secondary | ICD-10-CM | POA: Diagnosis not present

## 2023-07-21 DIAGNOSIS — Z992 Dependence on renal dialysis: Secondary | ICD-10-CM | POA: Diagnosis not present

## 2023-07-21 DIAGNOSIS — N186 End stage renal disease: Secondary | ICD-10-CM | POA: Diagnosis not present

## 2023-07-21 DIAGNOSIS — N2581 Secondary hyperparathyroidism of renal origin: Secondary | ICD-10-CM | POA: Diagnosis not present

## 2023-07-23 DIAGNOSIS — N186 End stage renal disease: Secondary | ICD-10-CM | POA: Diagnosis not present

## 2023-07-23 DIAGNOSIS — M810 Age-related osteoporosis without current pathological fracture: Secondary | ICD-10-CM | POA: Diagnosis not present

## 2023-07-23 DIAGNOSIS — E1122 Type 2 diabetes mellitus with diabetic chronic kidney disease: Secondary | ICD-10-CM | POA: Diagnosis not present

## 2023-07-23 DIAGNOSIS — I10 Essential (primary) hypertension: Secondary | ICD-10-CM | POA: Diagnosis not present

## 2023-07-23 DIAGNOSIS — N2581 Secondary hyperparathyroidism of renal origin: Secondary | ICD-10-CM | POA: Diagnosis not present

## 2023-07-23 DIAGNOSIS — K219 Gastro-esophageal reflux disease without esophagitis: Secondary | ICD-10-CM | POA: Diagnosis not present

## 2023-07-23 DIAGNOSIS — Z86718 Personal history of other venous thrombosis and embolism: Secondary | ICD-10-CM | POA: Diagnosis not present

## 2023-07-23 DIAGNOSIS — E782 Mixed hyperlipidemia: Secondary | ICD-10-CM | POA: Diagnosis not present

## 2023-07-23 DIAGNOSIS — Z992 Dependence on renal dialysis: Secondary | ICD-10-CM | POA: Diagnosis not present

## 2023-07-25 DIAGNOSIS — N186 End stage renal disease: Secondary | ICD-10-CM | POA: Diagnosis not present

## 2023-07-25 DIAGNOSIS — Z992 Dependence on renal dialysis: Secondary | ICD-10-CM | POA: Diagnosis not present

## 2023-07-26 DIAGNOSIS — N2581 Secondary hyperparathyroidism of renal origin: Secondary | ICD-10-CM | POA: Diagnosis not present

## 2023-07-26 DIAGNOSIS — Z992 Dependence on renal dialysis: Secondary | ICD-10-CM | POA: Diagnosis not present

## 2023-07-26 DIAGNOSIS — N186 End stage renal disease: Secondary | ICD-10-CM | POA: Diagnosis not present

## 2023-07-28 DIAGNOSIS — Z992 Dependence on renal dialysis: Secondary | ICD-10-CM | POA: Diagnosis not present

## 2023-07-28 DIAGNOSIS — N186 End stage renal disease: Secondary | ICD-10-CM | POA: Diagnosis not present

## 2023-07-28 DIAGNOSIS — N2581 Secondary hyperparathyroidism of renal origin: Secondary | ICD-10-CM | POA: Diagnosis not present

## 2023-07-30 DIAGNOSIS — Z992 Dependence on renal dialysis: Secondary | ICD-10-CM | POA: Diagnosis not present

## 2023-07-30 DIAGNOSIS — N2581 Secondary hyperparathyroidism of renal origin: Secondary | ICD-10-CM | POA: Diagnosis not present

## 2023-07-30 DIAGNOSIS — N186 End stage renal disease: Secondary | ICD-10-CM | POA: Diagnosis not present

## 2023-08-02 DIAGNOSIS — N2581 Secondary hyperparathyroidism of renal origin: Secondary | ICD-10-CM | POA: Diagnosis not present

## 2023-08-02 DIAGNOSIS — Z992 Dependence on renal dialysis: Secondary | ICD-10-CM | POA: Diagnosis not present

## 2023-08-02 DIAGNOSIS — N186 End stage renal disease: Secondary | ICD-10-CM | POA: Diagnosis not present

## 2023-08-03 DIAGNOSIS — I340 Nonrheumatic mitral (valve) insufficiency: Secondary | ICD-10-CM | POA: Diagnosis not present

## 2023-08-03 DIAGNOSIS — I10 Essential (primary) hypertension: Secondary | ICD-10-CM | POA: Diagnosis not present

## 2023-08-03 DIAGNOSIS — G8929 Other chronic pain: Secondary | ICD-10-CM | POA: Diagnosis not present

## 2023-08-03 DIAGNOSIS — M25552 Pain in left hip: Secondary | ICD-10-CM | POA: Diagnosis not present

## 2023-08-03 DIAGNOSIS — Z992 Dependence on renal dialysis: Secondary | ICD-10-CM | POA: Diagnosis not present

## 2023-08-03 DIAGNOSIS — N186 End stage renal disease: Secondary | ICD-10-CM | POA: Diagnosis not present

## 2023-08-04 DIAGNOSIS — N2581 Secondary hyperparathyroidism of renal origin: Secondary | ICD-10-CM | POA: Diagnosis not present

## 2023-08-04 DIAGNOSIS — Z992 Dependence on renal dialysis: Secondary | ICD-10-CM | POA: Diagnosis not present

## 2023-08-04 DIAGNOSIS — N186 End stage renal disease: Secondary | ICD-10-CM | POA: Diagnosis not present

## 2023-08-06 DIAGNOSIS — N186 End stage renal disease: Secondary | ICD-10-CM | POA: Diagnosis not present

## 2023-08-06 DIAGNOSIS — Z992 Dependence on renal dialysis: Secondary | ICD-10-CM | POA: Diagnosis not present

## 2023-08-06 DIAGNOSIS — N2581 Secondary hyperparathyroidism of renal origin: Secondary | ICD-10-CM | POA: Diagnosis not present

## 2023-08-09 DIAGNOSIS — Z992 Dependence on renal dialysis: Secondary | ICD-10-CM | POA: Diagnosis not present

## 2023-08-09 DIAGNOSIS — N186 End stage renal disease: Secondary | ICD-10-CM | POA: Diagnosis not present

## 2023-08-09 DIAGNOSIS — N2581 Secondary hyperparathyroidism of renal origin: Secondary | ICD-10-CM | POA: Diagnosis not present

## 2023-08-11 DIAGNOSIS — Z992 Dependence on renal dialysis: Secondary | ICD-10-CM | POA: Diagnosis not present

## 2023-08-11 DIAGNOSIS — N2581 Secondary hyperparathyroidism of renal origin: Secondary | ICD-10-CM | POA: Diagnosis not present

## 2023-08-11 DIAGNOSIS — N186 End stage renal disease: Secondary | ICD-10-CM | POA: Diagnosis not present

## 2023-08-13 DIAGNOSIS — N186 End stage renal disease: Secondary | ICD-10-CM | POA: Diagnosis not present

## 2023-08-13 DIAGNOSIS — N2581 Secondary hyperparathyroidism of renal origin: Secondary | ICD-10-CM | POA: Diagnosis not present

## 2023-08-13 DIAGNOSIS — Z992 Dependence on renal dialysis: Secondary | ICD-10-CM | POA: Diagnosis not present

## 2023-08-16 DIAGNOSIS — N2581 Secondary hyperparathyroidism of renal origin: Secondary | ICD-10-CM | POA: Diagnosis not present

## 2023-08-16 DIAGNOSIS — Z992 Dependence on renal dialysis: Secondary | ICD-10-CM | POA: Diagnosis not present

## 2023-08-16 DIAGNOSIS — E1122 Type 2 diabetes mellitus with diabetic chronic kidney disease: Secondary | ICD-10-CM | POA: Diagnosis not present

## 2023-08-16 DIAGNOSIS — N186 End stage renal disease: Secondary | ICD-10-CM | POA: Diagnosis not present

## 2023-08-18 DIAGNOSIS — N2581 Secondary hyperparathyroidism of renal origin: Secondary | ICD-10-CM | POA: Diagnosis not present

## 2023-08-18 DIAGNOSIS — Z992 Dependence on renal dialysis: Secondary | ICD-10-CM | POA: Diagnosis not present

## 2023-08-18 DIAGNOSIS — N186 End stage renal disease: Secondary | ICD-10-CM | POA: Diagnosis not present

## 2023-08-20 DIAGNOSIS — Z992 Dependence on renal dialysis: Secondary | ICD-10-CM | POA: Diagnosis not present

## 2023-08-20 DIAGNOSIS — N2581 Secondary hyperparathyroidism of renal origin: Secondary | ICD-10-CM | POA: Diagnosis not present

## 2023-08-20 DIAGNOSIS — N186 End stage renal disease: Secondary | ICD-10-CM | POA: Diagnosis not present

## 2023-08-23 DIAGNOSIS — N186 End stage renal disease: Secondary | ICD-10-CM | POA: Diagnosis not present

## 2023-08-23 DIAGNOSIS — N2581 Secondary hyperparathyroidism of renal origin: Secondary | ICD-10-CM | POA: Diagnosis not present

## 2023-08-23 DIAGNOSIS — Z992 Dependence on renal dialysis: Secondary | ICD-10-CM | POA: Diagnosis not present

## 2023-08-24 DIAGNOSIS — Z452 Encounter for adjustment and management of vascular access device: Secondary | ICD-10-CM | POA: Diagnosis not present

## 2023-08-24 DIAGNOSIS — N186 End stage renal disease: Secondary | ICD-10-CM | POA: Diagnosis not present

## 2023-08-24 DIAGNOSIS — Z992 Dependence on renal dialysis: Secondary | ICD-10-CM | POA: Diagnosis not present

## 2023-08-25 DIAGNOSIS — Z992 Dependence on renal dialysis: Secondary | ICD-10-CM | POA: Diagnosis not present

## 2023-08-25 DIAGNOSIS — N2581 Secondary hyperparathyroidism of renal origin: Secondary | ICD-10-CM | POA: Diagnosis not present

## 2023-08-25 DIAGNOSIS — N186 End stage renal disease: Secondary | ICD-10-CM | POA: Diagnosis not present

## 2023-08-27 DIAGNOSIS — N2581 Secondary hyperparathyroidism of renal origin: Secondary | ICD-10-CM | POA: Diagnosis not present

## 2023-08-27 DIAGNOSIS — Z992 Dependence on renal dialysis: Secondary | ICD-10-CM | POA: Diagnosis not present

## 2023-08-27 DIAGNOSIS — N186 End stage renal disease: Secondary | ICD-10-CM | POA: Diagnosis not present

## 2023-08-30 ENCOUNTER — Emergency Department (HOSPITAL_COMMUNITY)
Admission: EM | Admit: 2023-08-30 | Discharge: 2023-08-30 | Disposition: A | Discharge status: Home / Self Care | Payer: Medicare PPO | Attending: Emergency Medicine | Admitting: Emergency Medicine

## 2023-08-30 ENCOUNTER — Other Ambulatory Visit: Payer: Self-pay

## 2023-08-30 ENCOUNTER — Emergency Department (HOSPITAL_COMMUNITY): Payer: Medicare PPO

## 2023-08-30 DIAGNOSIS — N186 End stage renal disease: Secondary | ICD-10-CM | POA: Diagnosis not present

## 2023-08-30 DIAGNOSIS — R519 Headache, unspecified: Secondary | ICD-10-CM | POA: Insufficient documentation

## 2023-08-30 DIAGNOSIS — Z5321 Procedure and treatment not carried out due to patient leaving prior to being seen by health care provider: Secondary | ICD-10-CM | POA: Diagnosis not present

## 2023-08-30 DIAGNOSIS — R918 Other nonspecific abnormal finding of lung field: Secondary | ICD-10-CM | POA: Diagnosis not present

## 2023-08-30 DIAGNOSIS — R079 Chest pain, unspecified: Secondary | ICD-10-CM | POA: Diagnosis not present

## 2023-08-30 DIAGNOSIS — I517 Cardiomegaly: Secondary | ICD-10-CM | POA: Diagnosis not present

## 2023-08-30 DIAGNOSIS — R531 Weakness: Secondary | ICD-10-CM | POA: Diagnosis not present

## 2023-08-30 DIAGNOSIS — Z992 Dependence on renal dialysis: Secondary | ICD-10-CM | POA: Insufficient documentation

## 2023-08-30 DIAGNOSIS — I10 Essential (primary) hypertension: Secondary | ICD-10-CM | POA: Diagnosis not present

## 2023-08-30 DIAGNOSIS — N2581 Secondary hyperparathyroidism of renal origin: Secondary | ICD-10-CM | POA: Diagnosis not present

## 2023-08-30 LAB — BASIC METABOLIC PANEL
Anion gap: 12 (ref 5–15)
BUN: 25 mg/dL — ABNORMAL HIGH (ref 8–23)
CO2: 25 mmol/L (ref 22–32)
Calcium: 8.2 mg/dL — ABNORMAL LOW (ref 8.9–10.3)
Chloride: 98 mmol/L (ref 98–111)
Creatinine, Ser: 5.91 mg/dL — ABNORMAL HIGH (ref 0.44–1.00)
GFR, Estimated: 7 mL/min — ABNORMAL LOW (ref >60)
Glucose, Bld: 109 mg/dL — ABNORMAL HIGH (ref 70–99)
Potassium: 4.1 mmol/L (ref 3.5–5.1)
Sodium: 135 mmol/L (ref 135–145)

## 2023-08-30 LAB — CBC
HCT: 37.3 % (ref 36.0–46.0)
Hemoglobin: 12 g/dL (ref 12.0–15.0)
MCH: 26.8 pg (ref 26.0–34.0)
MCHC: 32.2 g/dL (ref 30.0–36.0)
MCV: 83.4 fL (ref 80.0–100.0)
Platelets: 129 10*3/uL — ABNORMAL LOW (ref 150–400)
RBC: 4.47 MIL/uL (ref 3.87–5.11)
RDW: 17 % — ABNORMAL HIGH (ref 11.5–15.5)
WBC: 4.5 10*3/uL (ref 4.0–10.5)
nRBC: 0 % (ref 0.0–0.2)

## 2023-08-30 LAB — TROPONIN I (HIGH SENSITIVITY): Troponin I (High Sensitivity): 19 ng/L — ABNORMAL HIGH (ref <18)

## 2023-08-30 NOTE — ED Triage Notes (Signed)
Pt comes in with c/o chest pain, HA, hypertension. CP is left sided and radiates to right arm. CP started this morning while pt was in dialysis. Pt finished dialysis. Pt was also hypertensive during dialysis and when taking BO at home it was in the 200s. Pt describes CP as dull. Denies SOB

## 2023-08-31 DIAGNOSIS — E119 Type 2 diabetes mellitus without complications: Secondary | ICD-10-CM | POA: Diagnosis not present

## 2023-08-31 DIAGNOSIS — Z79899 Other long term (current) drug therapy: Secondary | ICD-10-CM | POA: Diagnosis not present

## 2023-08-31 DIAGNOSIS — Z7902 Long term (current) use of antithrombotics/antiplatelets: Secondary | ICD-10-CM | POA: Diagnosis not present

## 2023-08-31 DIAGNOSIS — G43809 Other migraine, not intractable, without status migrainosus: Secondary | ICD-10-CM | POA: Diagnosis not present

## 2023-08-31 DIAGNOSIS — G43909 Migraine, unspecified, not intractable, without status migrainosus: Secondary | ICD-10-CM | POA: Diagnosis not present

## 2023-08-31 DIAGNOSIS — I10 Essential (primary) hypertension: Secondary | ICD-10-CM | POA: Diagnosis not present

## 2023-08-31 DIAGNOSIS — Z8673 Personal history of transient ischemic attack (TIA), and cerebral infarction without residual deficits: Secondary | ICD-10-CM | POA: Diagnosis not present

## 2023-08-31 DIAGNOSIS — Z7901 Long term (current) use of anticoagulants: Secondary | ICD-10-CM | POA: Diagnosis not present

## 2023-08-31 DIAGNOSIS — R0789 Other chest pain: Secondary | ICD-10-CM | POA: Diagnosis not present

## 2023-09-01 DIAGNOSIS — N2581 Secondary hyperparathyroidism of renal origin: Secondary | ICD-10-CM | POA: Diagnosis not present

## 2023-09-01 DIAGNOSIS — Z992 Dependence on renal dialysis: Secondary | ICD-10-CM | POA: Diagnosis not present

## 2023-09-01 DIAGNOSIS — N186 End stage renal disease: Secondary | ICD-10-CM | POA: Diagnosis not present

## 2023-09-03 DIAGNOSIS — N186 End stage renal disease: Secondary | ICD-10-CM | POA: Diagnosis not present

## 2023-09-03 DIAGNOSIS — N2581 Secondary hyperparathyroidism of renal origin: Secondary | ICD-10-CM | POA: Diagnosis not present

## 2023-09-03 DIAGNOSIS — Z992 Dependence on renal dialysis: Secondary | ICD-10-CM | POA: Diagnosis not present

## 2023-09-07 DIAGNOSIS — N186 End stage renal disease: Secondary | ICD-10-CM | POA: Diagnosis not present

## 2023-09-07 DIAGNOSIS — Z992 Dependence on renal dialysis: Secondary | ICD-10-CM | POA: Diagnosis not present

## 2023-09-07 DIAGNOSIS — N2581 Secondary hyperparathyroidism of renal origin: Secondary | ICD-10-CM | POA: Diagnosis not present

## 2023-09-10 DIAGNOSIS — Z992 Dependence on renal dialysis: Secondary | ICD-10-CM | POA: Diagnosis not present

## 2023-09-10 DIAGNOSIS — N2581 Secondary hyperparathyroidism of renal origin: Secondary | ICD-10-CM | POA: Diagnosis not present

## 2023-09-10 DIAGNOSIS — N186 End stage renal disease: Secondary | ICD-10-CM | POA: Diagnosis not present

## 2023-09-13 DIAGNOSIS — N2581 Secondary hyperparathyroidism of renal origin: Secondary | ICD-10-CM | POA: Diagnosis not present

## 2023-09-13 DIAGNOSIS — N186 End stage renal disease: Secondary | ICD-10-CM | POA: Diagnosis not present

## 2023-09-13 DIAGNOSIS — Z992 Dependence on renal dialysis: Secondary | ICD-10-CM | POA: Diagnosis not present

## 2023-09-15 DIAGNOSIS — N2581 Secondary hyperparathyroidism of renal origin: Secondary | ICD-10-CM | POA: Diagnosis not present

## 2023-09-15 DIAGNOSIS — Z992 Dependence on renal dialysis: Secondary | ICD-10-CM | POA: Diagnosis not present

## 2023-09-15 DIAGNOSIS — N186 End stage renal disease: Secondary | ICD-10-CM | POA: Diagnosis not present

## 2023-09-17 DIAGNOSIS — N186 End stage renal disease: Secondary | ICD-10-CM | POA: Diagnosis not present

## 2023-09-17 DIAGNOSIS — Z992 Dependence on renal dialysis: Secondary | ICD-10-CM | POA: Diagnosis not present

## 2023-09-17 DIAGNOSIS — N2581 Secondary hyperparathyroidism of renal origin: Secondary | ICD-10-CM | POA: Diagnosis not present

## 2023-09-20 DIAGNOSIS — Z992 Dependence on renal dialysis: Secondary | ICD-10-CM | POA: Diagnosis not present

## 2023-09-20 DIAGNOSIS — N2581 Secondary hyperparathyroidism of renal origin: Secondary | ICD-10-CM | POA: Diagnosis not present

## 2023-09-20 DIAGNOSIS — N186 End stage renal disease: Secondary | ICD-10-CM | POA: Diagnosis not present

## 2023-09-22 DIAGNOSIS — N2581 Secondary hyperparathyroidism of renal origin: Secondary | ICD-10-CM | POA: Diagnosis not present

## 2023-09-22 DIAGNOSIS — N186 End stage renal disease: Secondary | ICD-10-CM | POA: Diagnosis not present

## 2023-09-22 DIAGNOSIS — Z992 Dependence on renal dialysis: Secondary | ICD-10-CM | POA: Diagnosis not present

## 2023-09-24 DIAGNOSIS — N2581 Secondary hyperparathyroidism of renal origin: Secondary | ICD-10-CM | POA: Diagnosis not present

## 2023-09-24 DIAGNOSIS — N186 End stage renal disease: Secondary | ICD-10-CM | POA: Diagnosis not present

## 2023-09-24 DIAGNOSIS — Z992 Dependence on renal dialysis: Secondary | ICD-10-CM | POA: Diagnosis not present

## 2023-09-27 DIAGNOSIS — Z992 Dependence on renal dialysis: Secondary | ICD-10-CM | POA: Diagnosis not present

## 2023-09-27 DIAGNOSIS — N186 End stage renal disease: Secondary | ICD-10-CM | POA: Diagnosis not present

## 2023-09-27 DIAGNOSIS — N2581 Secondary hyperparathyroidism of renal origin: Secondary | ICD-10-CM | POA: Diagnosis not present

## 2023-09-29 DIAGNOSIS — N186 End stage renal disease: Secondary | ICD-10-CM | POA: Diagnosis not present

## 2023-09-29 DIAGNOSIS — Z992 Dependence on renal dialysis: Secondary | ICD-10-CM | POA: Diagnosis not present

## 2023-09-29 DIAGNOSIS — N2581 Secondary hyperparathyroidism of renal origin: Secondary | ICD-10-CM | POA: Diagnosis not present

## 2023-10-01 DIAGNOSIS — N186 End stage renal disease: Secondary | ICD-10-CM | POA: Diagnosis not present

## 2023-10-01 DIAGNOSIS — N2581 Secondary hyperparathyroidism of renal origin: Secondary | ICD-10-CM | POA: Diagnosis not present

## 2023-10-01 DIAGNOSIS — Z992 Dependence on renal dialysis: Secondary | ICD-10-CM | POA: Diagnosis not present

## 2023-10-04 DIAGNOSIS — N186 End stage renal disease: Secondary | ICD-10-CM | POA: Diagnosis not present

## 2023-10-04 DIAGNOSIS — Z992 Dependence on renal dialysis: Secondary | ICD-10-CM | POA: Diagnosis not present

## 2023-10-04 DIAGNOSIS — N2581 Secondary hyperparathyroidism of renal origin: Secondary | ICD-10-CM | POA: Diagnosis not present

## 2023-10-06 DIAGNOSIS — N2581 Secondary hyperparathyroidism of renal origin: Secondary | ICD-10-CM | POA: Diagnosis not present

## 2023-10-06 DIAGNOSIS — Z992 Dependence on renal dialysis: Secondary | ICD-10-CM | POA: Diagnosis not present

## 2023-10-06 DIAGNOSIS — N186 End stage renal disease: Secondary | ICD-10-CM | POA: Diagnosis not present

## 2023-10-08 DIAGNOSIS — Z992 Dependence on renal dialysis: Secondary | ICD-10-CM | POA: Diagnosis not present

## 2023-10-08 DIAGNOSIS — N186 End stage renal disease: Secondary | ICD-10-CM | POA: Diagnosis not present

## 2023-10-08 DIAGNOSIS — N2581 Secondary hyperparathyroidism of renal origin: Secondary | ICD-10-CM | POA: Diagnosis not present

## 2023-10-11 DIAGNOSIS — Z992 Dependence on renal dialysis: Secondary | ICD-10-CM | POA: Diagnosis not present

## 2023-10-11 DIAGNOSIS — N186 End stage renal disease: Secondary | ICD-10-CM | POA: Diagnosis not present

## 2023-10-11 DIAGNOSIS — N2581 Secondary hyperparathyroidism of renal origin: Secondary | ICD-10-CM | POA: Diagnosis not present

## 2023-10-13 DIAGNOSIS — N2581 Secondary hyperparathyroidism of renal origin: Secondary | ICD-10-CM | POA: Diagnosis not present

## 2023-10-13 DIAGNOSIS — N186 End stage renal disease: Secondary | ICD-10-CM | POA: Diagnosis not present

## 2023-10-13 DIAGNOSIS — Z992 Dependence on renal dialysis: Secondary | ICD-10-CM | POA: Diagnosis not present

## 2023-10-15 DIAGNOSIS — Z992 Dependence on renal dialysis: Secondary | ICD-10-CM | POA: Diagnosis not present

## 2023-10-15 DIAGNOSIS — N2581 Secondary hyperparathyroidism of renal origin: Secondary | ICD-10-CM | POA: Diagnosis not present

## 2023-10-15 DIAGNOSIS — N186 End stage renal disease: Secondary | ICD-10-CM | POA: Diagnosis not present

## 2023-10-18 DIAGNOSIS — N186 End stage renal disease: Secondary | ICD-10-CM | POA: Diagnosis not present

## 2023-10-18 DIAGNOSIS — Z992 Dependence on renal dialysis: Secondary | ICD-10-CM | POA: Diagnosis not present

## 2023-10-18 DIAGNOSIS — N2581 Secondary hyperparathyroidism of renal origin: Secondary | ICD-10-CM | POA: Diagnosis not present

## 2023-10-20 DIAGNOSIS — N186 End stage renal disease: Secondary | ICD-10-CM | POA: Diagnosis not present

## 2023-10-20 DIAGNOSIS — N2581 Secondary hyperparathyroidism of renal origin: Secondary | ICD-10-CM | POA: Diagnosis not present

## 2023-10-20 DIAGNOSIS — Z992 Dependence on renal dialysis: Secondary | ICD-10-CM | POA: Diagnosis not present

## 2023-10-22 DIAGNOSIS — I10 Essential (primary) hypertension: Secondary | ICD-10-CM | POA: Diagnosis not present

## 2023-10-22 DIAGNOSIS — G43909 Migraine, unspecified, not intractable, without status migrainosus: Secondary | ICD-10-CM | POA: Diagnosis not present

## 2023-10-22 DIAGNOSIS — K219 Gastro-esophageal reflux disease without esophagitis: Secondary | ICD-10-CM | POA: Diagnosis not present

## 2023-10-22 DIAGNOSIS — Z86718 Personal history of other venous thrombosis and embolism: Secondary | ICD-10-CM | POA: Diagnosis not present

## 2023-10-22 DIAGNOSIS — N2581 Secondary hyperparathyroidism of renal origin: Secondary | ICD-10-CM | POA: Diagnosis not present

## 2023-10-22 DIAGNOSIS — Z992 Dependence on renal dialysis: Secondary | ICD-10-CM | POA: Diagnosis not present

## 2023-10-22 DIAGNOSIS — N186 End stage renal disease: Secondary | ICD-10-CM | POA: Diagnosis not present

## 2023-10-24 DIAGNOSIS — N186 End stage renal disease: Secondary | ICD-10-CM | POA: Diagnosis not present

## 2023-10-24 DIAGNOSIS — Z992 Dependence on renal dialysis: Secondary | ICD-10-CM | POA: Diagnosis not present

## 2023-10-25 DIAGNOSIS — N186 End stage renal disease: Secondary | ICD-10-CM | POA: Diagnosis not present

## 2023-10-25 DIAGNOSIS — Z992 Dependence on renal dialysis: Secondary | ICD-10-CM | POA: Diagnosis not present

## 2023-10-26 DIAGNOSIS — Z23 Encounter for immunization: Secondary | ICD-10-CM | POA: Diagnosis not present

## 2023-10-26 DIAGNOSIS — R030 Elevated blood-pressure reading, without diagnosis of hypertension: Secondary | ICD-10-CM | POA: Diagnosis not present

## 2023-10-26 DIAGNOSIS — M810 Age-related osteoporosis without current pathological fracture: Secondary | ICD-10-CM | POA: Diagnosis not present

## 2023-10-26 DIAGNOSIS — G43909 Migraine, unspecified, not intractable, without status migrainosus: Secondary | ICD-10-CM | POA: Diagnosis not present

## 2023-10-26 DIAGNOSIS — R5383 Other fatigue: Secondary | ICD-10-CM | POA: Diagnosis not present

## 2023-10-26 DIAGNOSIS — Z6823 Body mass index (BMI) 23.0-23.9, adult: Secondary | ICD-10-CM | POA: Diagnosis not present

## 2023-10-26 DIAGNOSIS — E1122 Type 2 diabetes mellitus with diabetic chronic kidney disease: Secondary | ICD-10-CM | POA: Diagnosis not present

## 2023-10-26 DIAGNOSIS — N186 End stage renal disease: Secondary | ICD-10-CM | POA: Diagnosis not present

## 2023-10-27 DIAGNOSIS — N186 End stage renal disease: Secondary | ICD-10-CM | POA: Diagnosis not present

## 2023-10-27 DIAGNOSIS — Z992 Dependence on renal dialysis: Secondary | ICD-10-CM | POA: Diagnosis not present

## 2023-10-29 DIAGNOSIS — Z992 Dependence on renal dialysis: Secondary | ICD-10-CM | POA: Diagnosis not present

## 2023-10-29 DIAGNOSIS — N186 End stage renal disease: Secondary | ICD-10-CM | POA: Diagnosis not present

## 2023-11-01 DIAGNOSIS — N186 End stage renal disease: Secondary | ICD-10-CM | POA: Diagnosis not present

## 2023-11-01 DIAGNOSIS — Z992 Dependence on renal dialysis: Secondary | ICD-10-CM | POA: Diagnosis not present

## 2023-11-03 DIAGNOSIS — Z992 Dependence on renal dialysis: Secondary | ICD-10-CM | POA: Diagnosis not present

## 2023-11-03 DIAGNOSIS — N186 End stage renal disease: Secondary | ICD-10-CM | POA: Diagnosis not present

## 2023-11-04 DIAGNOSIS — T503X5A Adverse effect of electrolytic, caloric and water-balance agents, initial encounter: Secondary | ICD-10-CM | POA: Diagnosis not present

## 2023-11-04 DIAGNOSIS — I120 Hypertensive chronic kidney disease with stage 5 chronic kidney disease or end stage renal disease: Secondary | ICD-10-CM | POA: Diagnosis not present

## 2023-11-04 DIAGNOSIS — I952 Hypotension due to drugs: Secondary | ICD-10-CM | POA: Diagnosis not present

## 2023-11-04 DIAGNOSIS — Z7982 Long term (current) use of aspirin: Secondary | ICD-10-CM | POA: Diagnosis not present

## 2023-11-04 DIAGNOSIS — Z8673 Personal history of transient ischemic attack (TIA), and cerebral infarction without residual deficits: Secondary | ICD-10-CM | POA: Diagnosis not present

## 2023-11-04 DIAGNOSIS — I340 Nonrheumatic mitral (valve) insufficiency: Secondary | ICD-10-CM | POA: Diagnosis not present

## 2023-11-04 DIAGNOSIS — Z992 Dependence on renal dialysis: Secondary | ICD-10-CM | POA: Diagnosis not present

## 2023-11-04 DIAGNOSIS — N186 End stage renal disease: Secondary | ICD-10-CM | POA: Diagnosis not present

## 2023-11-05 DIAGNOSIS — N186 End stage renal disease: Secondary | ICD-10-CM | POA: Diagnosis not present

## 2023-11-05 DIAGNOSIS — Z992 Dependence on renal dialysis: Secondary | ICD-10-CM | POA: Diagnosis not present

## 2023-11-08 DIAGNOSIS — N186 End stage renal disease: Secondary | ICD-10-CM | POA: Diagnosis not present

## 2023-11-08 DIAGNOSIS — Z992 Dependence on renal dialysis: Secondary | ICD-10-CM | POA: Diagnosis not present

## 2023-11-10 DIAGNOSIS — Z992 Dependence on renal dialysis: Secondary | ICD-10-CM | POA: Diagnosis not present

## 2023-11-10 DIAGNOSIS — N186 End stage renal disease: Secondary | ICD-10-CM | POA: Diagnosis not present

## 2023-11-12 DIAGNOSIS — N186 End stage renal disease: Secondary | ICD-10-CM | POA: Diagnosis not present

## 2023-11-12 DIAGNOSIS — Z992 Dependence on renal dialysis: Secondary | ICD-10-CM | POA: Diagnosis not present

## 2023-11-15 DIAGNOSIS — Z992 Dependence on renal dialysis: Secondary | ICD-10-CM | POA: Diagnosis not present

## 2023-11-15 DIAGNOSIS — N186 End stage renal disease: Secondary | ICD-10-CM | POA: Diagnosis not present

## 2023-11-18 DIAGNOSIS — N186 End stage renal disease: Secondary | ICD-10-CM | POA: Diagnosis not present

## 2023-11-18 DIAGNOSIS — Z992 Dependence on renal dialysis: Secondary | ICD-10-CM | POA: Diagnosis not present

## 2023-11-19 DIAGNOSIS — Z992 Dependence on renal dialysis: Secondary | ICD-10-CM | POA: Diagnosis not present

## 2023-11-19 DIAGNOSIS — N186 End stage renal disease: Secondary | ICD-10-CM | POA: Diagnosis not present

## 2023-11-19 DIAGNOSIS — E1122 Type 2 diabetes mellitus with diabetic chronic kidney disease: Secondary | ICD-10-CM | POA: Diagnosis not present

## 2023-11-22 DIAGNOSIS — Z992 Dependence on renal dialysis: Secondary | ICD-10-CM | POA: Diagnosis not present

## 2023-11-22 DIAGNOSIS — N186 End stage renal disease: Secondary | ICD-10-CM | POA: Diagnosis not present

## 2023-11-24 DIAGNOSIS — Z992 Dependence on renal dialysis: Secondary | ICD-10-CM | POA: Diagnosis not present

## 2023-11-24 DIAGNOSIS — N186 End stage renal disease: Secondary | ICD-10-CM | POA: Diagnosis not present

## 2023-11-26 DIAGNOSIS — N186 End stage renal disease: Secondary | ICD-10-CM | POA: Diagnosis not present

## 2023-11-26 DIAGNOSIS — Z992 Dependence on renal dialysis: Secondary | ICD-10-CM | POA: Diagnosis not present

## 2023-11-29 ENCOUNTER — Other Ambulatory Visit (HOSPITAL_COMMUNITY)
Admission: RE | Admit: 2023-11-29 | Discharge: 2023-11-29 | Disposition: A | Discharge status: Home / Self Care | Payer: Medicare PPO | Source: Ambulatory Visit | Attending: Nephrology | Admitting: Nephrology

## 2023-11-29 DIAGNOSIS — Z992 Dependence on renal dialysis: Secondary | ICD-10-CM | POA: Diagnosis not present

## 2023-11-29 DIAGNOSIS — N186 End stage renal disease: Secondary | ICD-10-CM | POA: Diagnosis not present

## 2023-11-29 DIAGNOSIS — E875 Hyperkalemia: Secondary | ICD-10-CM | POA: Insufficient documentation

## 2023-11-29 LAB — COMPREHENSIVE METABOLIC PANEL
ALT: 11 U/L (ref 0–44)
AST: 16 U/L (ref 15–41)
Albumin: 3.9 g/dL (ref 3.5–5.0)
Alkaline Phosphatase: 86 U/L (ref 38–126)
Anion gap: 8 (ref 5–15)
BUN: 27 mg/dL — ABNORMAL HIGH (ref 8–23)
CO2: 27 mmol/L (ref 22–32)
Calcium: 8.8 mg/dL — ABNORMAL LOW (ref 8.9–10.3)
Chloride: 98 mmol/L (ref 98–111)
Creatinine, Ser: 5.4 mg/dL — ABNORMAL HIGH (ref 0.44–1.00)
GFR, Estimated: 8 mL/min — ABNORMAL LOW (ref >60)
Glucose, Bld: 119 mg/dL — ABNORMAL HIGH (ref 70–99)
Potassium: 5 mmol/L (ref 3.5–5.1)
Sodium: 133 mmol/L — ABNORMAL LOW (ref 135–145)
Total Bilirubin: 0.6 mg/dL (ref 0.0–1.2)
Total Protein: 7.9 g/dL (ref 6.5–8.1)

## 2023-11-30 ENCOUNTER — Other Ambulatory Visit (HOSPITAL_COMMUNITY): Admission: RE | Admit: 2023-11-30 | Payer: Medicare PPO | Source: Ambulatory Visit

## 2023-11-30 ENCOUNTER — Encounter (HOSPITAL_COMMUNITY): Payer: Self-pay

## 2023-11-30 ENCOUNTER — Emergency Department (HOSPITAL_COMMUNITY)
Admission: EM | Admit: 2023-11-30 | Discharge: 2023-11-30 | Disposition: A | Discharge status: Home / Self Care | Payer: Medicare PPO | Attending: Emergency Medicine | Admitting: Emergency Medicine

## 2023-11-30 DIAGNOSIS — E1122 Type 2 diabetes mellitus with diabetic chronic kidney disease: Secondary | ICD-10-CM | POA: Insufficient documentation

## 2023-11-30 DIAGNOSIS — Z7901 Long term (current) use of anticoagulants: Secondary | ICD-10-CM | POA: Insufficient documentation

## 2023-11-30 DIAGNOSIS — Z8673 Personal history of transient ischemic attack (TIA), and cerebral infarction without residual deficits: Secondary | ICD-10-CM | POA: Diagnosis not present

## 2023-11-30 DIAGNOSIS — E1165 Type 2 diabetes mellitus with hyperglycemia: Secondary | ICD-10-CM | POA: Insufficient documentation

## 2023-11-30 DIAGNOSIS — R7989 Other specified abnormal findings of blood chemistry: Secondary | ICD-10-CM | POA: Insufficient documentation

## 2023-11-30 DIAGNOSIS — N186 End stage renal disease: Secondary | ICD-10-CM | POA: Diagnosis not present

## 2023-11-30 DIAGNOSIS — Z992 Dependence on renal dialysis: Secondary | ICD-10-CM | POA: Insufficient documentation

## 2023-11-30 DIAGNOSIS — Z79899 Other long term (current) drug therapy: Secondary | ICD-10-CM | POA: Insufficient documentation

## 2023-11-30 DIAGNOSIS — I120 Hypertensive chronic kidney disease with stage 5 chronic kidney disease or end stage renal disease: Secondary | ICD-10-CM | POA: Insufficient documentation

## 2023-11-30 DIAGNOSIS — I10 Essential (primary) hypertension: Secondary | ICD-10-CM

## 2023-11-30 DIAGNOSIS — R42 Dizziness and giddiness: Secondary | ICD-10-CM | POA: Diagnosis present

## 2023-11-30 LAB — COMPREHENSIVE METABOLIC PANEL
ALT: 11 U/L (ref 0–44)
AST: 16 U/L (ref 15–41)
Albumin: 4.1 g/dL (ref 3.5–5.0)
Alkaline Phosphatase: 87 U/L (ref 38–126)
Anion gap: 13 (ref 5–15)
BUN: 39 mg/dL — ABNORMAL HIGH (ref 8–23)
CO2: 24 mmol/L (ref 22–32)
Calcium: 9.1 mg/dL (ref 8.9–10.3)
Chloride: 101 mmol/L (ref 98–111)
Creatinine, Ser: 7.41 mg/dL — ABNORMAL HIGH (ref 0.44–1.00)
GFR, Estimated: 5 mL/min — ABNORMAL LOW (ref >60)
Glucose, Bld: 140 mg/dL — ABNORMAL HIGH (ref 70–99)
Potassium: 5 mmol/L (ref 3.5–5.1)
Sodium: 138 mmol/L (ref 135–145)
Total Bilirubin: 0.4 mg/dL (ref 0.0–1.2)
Total Protein: 8 g/dL (ref 6.5–8.1)

## 2023-11-30 LAB — CBC WITH DIFFERENTIAL/PLATELET
Abs Immature Granulocytes: 0.01 10*3/uL (ref 0.00–0.07)
Basophils Absolute: 0 10*3/uL (ref 0.0–0.1)
Basophils Relative: 1 %
Eosinophils Absolute: 0.1 10*3/uL (ref 0.0–0.5)
Eosinophils Relative: 1 %
HCT: 39.5 % (ref 36.0–46.0)
Hemoglobin: 12 g/dL (ref 12.0–15.0)
Immature Granulocytes: 0 %
Lymphocytes Relative: 24 %
Lymphs Abs: 1.2 10*3/uL (ref 0.7–4.0)
MCH: 26.4 pg (ref 26.0–34.0)
MCHC: 30.4 g/dL (ref 30.0–36.0)
MCV: 86.8 fL (ref 80.0–100.0)
Monocytes Absolute: 0.5 10*3/uL (ref 0.1–1.0)
Monocytes Relative: 11 %
Neutro Abs: 3.3 10*3/uL (ref 1.7–7.7)
Neutrophils Relative %: 63 %
Platelets: 164 10*3/uL (ref 150–400)
RBC: 4.55 MIL/uL (ref 3.87–5.11)
RDW: 19.1 % — ABNORMAL HIGH (ref 11.5–15.5)
WBC: 5.2 10*3/uL (ref 4.0–10.5)
nRBC: 0 % (ref 0.0–0.2)

## 2023-11-30 LAB — MAGNESIUM: Magnesium: 2.1 mg/dL (ref 1.7–2.4)

## 2023-11-30 LAB — CBG MONITORING, ED: Glucose-Capillary: 143 mg/dL — ABNORMAL HIGH (ref 70–99)

## 2023-11-30 MED ORDER — CARVEDILOL 12.5 MG PO TABS
12.5000 mg | ORAL_TABLET | Freq: Once | ORAL | Status: AC
  Administered 2023-11-30: 12.5 mg via ORAL
  Filled 2023-11-30: qty 4

## 2023-11-30 MED ORDER — AMLODIPINE BESYLATE 5 MG PO TABS
10.0000 mg | ORAL_TABLET | Freq: Once | ORAL | Status: AC
  Administered 2023-11-30: 10 mg via ORAL
  Filled 2023-11-30: qty 2

## 2023-11-30 NOTE — Discharge Instructions (Signed)
 Your lab work is reassuring today.  Your blood counts are all normal.  Your electrolytes are normal.  Your kidney function is elevated, but around your baseline.  Please attend your dialysis session tomorrow as scheduled.  You are given a dose of your home amlodipine  and carvedilol  here today.  Return to the ER for any chest pain, shortness of breath, loss of consciousness, severe dizziness, changes in vision, any other new or concerning symptoms.

## 2023-11-30 NOTE — ED Triage Notes (Addendum)
 Pt presents w/ dizziness x2-3 days and potassium of 5.0 during a blood draw yesterday.  Pt is a dialysis Pt.  Sts she has not missed any treatments.  Also, Pt mentioned her BP has been high x2-3 days.

## 2023-11-30 NOTE — ED Provider Notes (Signed)
 [place] EMERGENCY DEPARTMENT AT [facility] Provider Note   CSN: [id number] Arrival date & time: 11/30/23  1122     History  Chief Complaint  Patient presents with   Abnormal Lab   Dizziness    [name] is a 73 y.o. female with history of diabetes, stroke, hypertension, hyperlipidemia, end-stage renal disease on dialysis, presents because she states she was told to come to the ER for a high potassium value.  Also reports some feelings of dizziness intermittently over the past couple of days.  No room spinning, changes in vision, loss of consciousness.  States that usually happens when she stands up, and then when she sits down and she feels better.  Denies any chest pain or shortness of breath.  States she feels otherwise at her baseline and wants to go home.  Last dialysis session was yesterday, and has a session scheduled for tomorrow.  She did not take her blood pressure medications this morning.   Abnormal Lab Dizziness      Home Medications Prior to Admission medications   Medication Sig Start Date End Date Taking? Authorizing Provider  acetaminophen  (TYLENOL ) 325 MG tablet Take 2 tablets (650 mg total) by mouth every 12 (twelve) hours as needed for mild pain (or temp > 37.5 C (99.5 F)). Patient taking differently: Take 1,300 mg by mouth every 12 (twelve) hours as needed for mild pain (or temp > 37.5 C (99.5 F)). 12/04/19   Angiulli, Toribio PARAS, PA-C  alendronate (FOSAMAX) 70 MG tablet Take 70 mg by mouth once a week. 12/16/22   [provider]  amLODipine  (NORVASC ) 10 MG tablet Take by mouth. 10/12/19   [provider]  apixaban  (ELIQUIS ) 5 MG TABS tablet Take 1 tablet (5 mg total) by mouth 2 (two) times daily. 12/28/19   Raulkar, Sven SQUIBB, MD  carvedilol  (COREG ) 12.5 MG tablet Take 1 tablet (12.5 mg total) by mouth 2 (two) times daily. 12/19/19   Raulkar, Sven SQUIBB, MD  diclofenac  Sodium (VOLTAREN ) 1 % GEL Apply 2 g topically 4 (four) times  daily. 03/14/20   Raulkar, Sven SQUIBB, MD  isosorbide  mononitrate (IMDUR ) 60 MG 24 hr tablet Take 1 tablet (60 mg total) by mouth daily. 12/28/19   Raulkar, Sven SQUIBB, MD  midodrine (PROAMATINE) 10 MG tablet Take 10 mg by mouth daily. 12/16/22   [provider]  pantoprazole  (PROTONIX ) 40 MG tablet Take 1 tablet (40 mg total) by mouth daily. 12/05/19   Angiulli, Toribio PARAS, PA-C      Allergies    Patient has no known allergies.    Review of Systems   Review of Systems  Neurological:  Positive for dizziness.    Physical Exam Updated Vital Signs BP (!) 185/98   Pulse 80   Temp 98.8 F (37.1 C) (Oral)   Resp 18   Ht 5' 7 (1.702 m)   Wt 68 kg   SpO2 98%   BMI 23.49 kg/m  Physical Exam Vitals and nursing note reviewed.  Constitutional:      General: She is not in acute distress.    Appearance: She is well-developed.  HENT:     Head: Normocephalic and atraumatic.  Eyes:     Extraocular Movements: Extraocular movements intact.     Conjunctiva/sclera: Conjunctivae normal.     Pupils: Pupils are equal, round, and reactive to light.  Cardiovascular:     Rate and Rhythm: Normal rate and regular rhythm.     Heart sounds: No  murmur heard. Pulmonary:     Effort: Pulmonary effort is normal. No respiratory distress.     Breath sounds: Normal breath sounds.  Abdominal:     Palpations: Abdomen is soft.     Tenderness: There is no abdominal tenderness.  Musculoskeletal:        General: No swelling.     Cervical back: Neck supple.  Skin:    General: Skin is warm and dry.     Capillary Refill: Capillary refill takes less than 2 seconds.  Neurological:     General: No focal deficit present.     Mental Status: She is alert.  Psychiatric:        Mood and Affect: Mood normal.     ED Results / Procedures / Treatments   Labs (all labs ordered are listed, but only abnormal results are displayed) Labs Reviewed  CBC WITH DIFFERENTIAL/PLATELET - Abnormal; Notable for the following  components:      Result Value   RDW 19.1 (*)    All other components within normal limits  COMPREHENSIVE METABOLIC PANEL - Abnormal; Notable for the following components:   Glucose, Bld 140 (*)    BUN 39 (*)    Creatinine, Ser 7.41 (*)    GFR, Estimated 5 (*)    All other components within normal limits  CBG MONITORING, ED - Abnormal; Notable for the following components:   Glucose-Capillary 143 (*)    All other components within normal limits  MAGNESIUM    EKG EKG Interpretation Date/Time:  Tuesday November 30 2023 11:44:44 EST Ventricular Rate:  92 PR Interval:  185 QRS Duration:  84 QT Interval:  376 QTC Calculation: 466 R Axis:   91  Text Interpretation: Sinus rhythm Right axis deviation Anteroseptal infarct, old Confirmed by Cleotilde Rogue ([id number]) on 11/30/2023 12:21:04 PM  Radiology No results found.  Procedures Procedures    Medications Ordered in ED Medications  carvedilol  (COREG ) tablet 12.5 mg (12.5 mg Oral Given 11/30/23 1353)  amLODipine  (NORVASC ) tablet 10 mg (10 mg Oral Given 11/30/23 1353)    ED Course/ Medical Decision Making/ A&P                                 Medical Decision Making Amount and/or Complexity of Data Reviewed Labs: ordered.  Risk Prescription drug management.     Differential diagnosis includes but is not limited to external abnormality, orthostatic hypotension, hypoglycemia, anemia, arrhythmia  ED Course:  Patient well-appearing, stable vitals except for a elevated blood pressure at 185/98 upon arrival.  States she has not taken her home blood pressure medications this morning.  Her home amlodipine  and carvedilol  ordered and given prior to discharge.  States she is told to come in for an abnormal lab value.  Her CBC is unremarkable.  CMP also unremarkable.  Potassium within normal limits.  Creatinine is elevated, but consistent with end-stage renal disease.  No indication for emergent dialysis today. CBG 143, no concern for  hypoglycemia at this time.  Magnesium within normal limits.   She also mentioned having some dizziness over the past couple days, mostly when she goes to stand up.  Orthostatics were performed which showed no orthostatic hypotension here today. Denies any chest pain or shortness of breath, changes in vision, room spinning.  Indication for further workup today.  Suspect this may be due to too much fluid being taken for dialysis.  Patient states she feels at  baseline and requests discharge at this time.  Feel patient is stable and appropriate for discharge at this time.  Has dialysis session scheduled for tomorrow.  Encouraged her to attend this appointment.    Impression: Patient sent in for lab repeat  Disposition:  The patient was discharged home with instructions to attend her dialysis session tomorrow. Return precautions given.  Cardiac Monitoring: / EKG: The patient was maintained on a cardiac monitor.  I personally viewed and interpreted the cardiac monitored which showed an underlying rhythm of: Normal sinus rhythm                 Final Clinical Impression(s) / ED Diagnoses Final diagnoses:  Hypertension, unspecified type    Rx / DC Orders ED Discharge Orders     None         Veta Palma, DEVONNA 11/30/23 1358    Cleotilde Rogue, MD 11/30/23 1924

## 2023-11-30 NOTE — ED Notes (Addendum)
 Pt left her [employer] bag in Room 15 I tried calling her sons mobile 3 times, and her mobile 3 times Voice mails could not be left tagged with her MRN and label and locked up with security Left with Officer Rosalynn with [employer]. Black fanny pack

## 2023-12-01 DIAGNOSIS — N186 End stage renal disease: Secondary | ICD-10-CM | POA: Diagnosis not present

## 2023-12-01 DIAGNOSIS — Z992 Dependence on renal dialysis: Secondary | ICD-10-CM | POA: Diagnosis not present

## 2023-12-03 DIAGNOSIS — Z992 Dependence on renal dialysis: Secondary | ICD-10-CM | POA: Diagnosis not present

## 2023-12-03 DIAGNOSIS — N186 End stage renal disease: Secondary | ICD-10-CM | POA: Diagnosis not present

## 2023-12-06 DIAGNOSIS — N186 End stage renal disease: Secondary | ICD-10-CM | POA: Diagnosis not present

## 2023-12-06 DIAGNOSIS — Z992 Dependence on renal dialysis: Secondary | ICD-10-CM | POA: Diagnosis not present

## 2023-12-08 DIAGNOSIS — Z992 Dependence on renal dialysis: Secondary | ICD-10-CM | POA: Diagnosis not present

## 2023-12-08 DIAGNOSIS — N186 End stage renal disease: Secondary | ICD-10-CM | POA: Diagnosis not present

## 2023-12-10 DIAGNOSIS — Z992 Dependence on renal dialysis: Secondary | ICD-10-CM | POA: Diagnosis not present

## 2023-12-10 DIAGNOSIS — N186 End stage renal disease: Secondary | ICD-10-CM | POA: Diagnosis not present

## 2023-12-12 DIAGNOSIS — N186 End stage renal disease: Secondary | ICD-10-CM | POA: Diagnosis not present

## 2023-12-12 DIAGNOSIS — Z992 Dependence on renal dialysis: Secondary | ICD-10-CM | POA: Diagnosis not present

## 2023-12-13 DIAGNOSIS — Z992 Dependence on renal dialysis: Secondary | ICD-10-CM | POA: Diagnosis not present

## 2023-12-13 DIAGNOSIS — N186 End stage renal disease: Secondary | ICD-10-CM | POA: Diagnosis not present

## 2023-12-15 DIAGNOSIS — N186 End stage renal disease: Secondary | ICD-10-CM | POA: Diagnosis not present

## 2023-12-15 DIAGNOSIS — Z992 Dependence on renal dialysis: Secondary | ICD-10-CM | POA: Diagnosis not present

## 2023-12-17 DIAGNOSIS — N186 End stage renal disease: Secondary | ICD-10-CM | POA: Diagnosis not present

## 2023-12-17 DIAGNOSIS — Z992 Dependence on renal dialysis: Secondary | ICD-10-CM | POA: Diagnosis not present

## 2023-12-20 DIAGNOSIS — Z992 Dependence on renal dialysis: Secondary | ICD-10-CM | POA: Diagnosis not present

## 2023-12-20 DIAGNOSIS — N186 End stage renal disease: Secondary | ICD-10-CM | POA: Diagnosis not present

## 2023-12-22 DIAGNOSIS — N186 End stage renal disease: Secondary | ICD-10-CM | POA: Diagnosis not present

## 2023-12-22 DIAGNOSIS — Z992 Dependence on renal dialysis: Secondary | ICD-10-CM | POA: Diagnosis not present

## 2023-12-24 DIAGNOSIS — N186 End stage renal disease: Secondary | ICD-10-CM | POA: Diagnosis not present

## 2023-12-24 DIAGNOSIS — Z992 Dependence on renal dialysis: Secondary | ICD-10-CM | POA: Diagnosis not present

## 2023-12-25 DIAGNOSIS — Z992 Dependence on renal dialysis: Secondary | ICD-10-CM | POA: Diagnosis not present

## 2023-12-25 DIAGNOSIS — N186 End stage renal disease: Secondary | ICD-10-CM | POA: Diagnosis not present

## 2023-12-27 DIAGNOSIS — Z992 Dependence on renal dialysis: Secondary | ICD-10-CM | POA: Diagnosis not present

## 2023-12-27 DIAGNOSIS — N186 End stage renal disease: Secondary | ICD-10-CM | POA: Diagnosis not present

## 2023-12-29 DIAGNOSIS — N186 End stage renal disease: Secondary | ICD-10-CM | POA: Diagnosis not present

## 2023-12-29 DIAGNOSIS — Z992 Dependence on renal dialysis: Secondary | ICD-10-CM | POA: Diagnosis not present

## 2023-12-31 DIAGNOSIS — Z992 Dependence on renal dialysis: Secondary | ICD-10-CM | POA: Diagnosis not present

## 2023-12-31 DIAGNOSIS — N186 End stage renal disease: Secondary | ICD-10-CM | POA: Diagnosis not present

## 2024-01-03 DIAGNOSIS — Z992 Dependence on renal dialysis: Secondary | ICD-10-CM | POA: Diagnosis not present

## 2024-01-03 DIAGNOSIS — N186 End stage renal disease: Secondary | ICD-10-CM | POA: Diagnosis not present

## 2024-01-05 DIAGNOSIS — N186 End stage renal disease: Secondary | ICD-10-CM | POA: Diagnosis not present

## 2024-01-05 DIAGNOSIS — Z992 Dependence on renal dialysis: Secondary | ICD-10-CM | POA: Diagnosis not present

## 2024-01-07 DIAGNOSIS — Z992 Dependence on renal dialysis: Secondary | ICD-10-CM | POA: Diagnosis not present

## 2024-01-07 DIAGNOSIS — N186 End stage renal disease: Secondary | ICD-10-CM | POA: Diagnosis not present

## 2024-01-10 DIAGNOSIS — Z992 Dependence on renal dialysis: Secondary | ICD-10-CM | POA: Diagnosis not present

## 2024-01-10 DIAGNOSIS — N186 End stage renal disease: Secondary | ICD-10-CM | POA: Diagnosis not present

## 2024-01-12 DIAGNOSIS — Z992 Dependence on renal dialysis: Secondary | ICD-10-CM | POA: Diagnosis not present

## 2024-01-12 DIAGNOSIS — N186 End stage renal disease: Secondary | ICD-10-CM | POA: Diagnosis not present

## 2024-01-14 DIAGNOSIS — Z992 Dependence on renal dialysis: Secondary | ICD-10-CM | POA: Diagnosis not present

## 2024-01-14 DIAGNOSIS — N186 End stage renal disease: Secondary | ICD-10-CM | POA: Diagnosis not present

## 2024-01-17 DIAGNOSIS — Z992 Dependence on renal dialysis: Secondary | ICD-10-CM | POA: Diagnosis not present

## 2024-01-17 DIAGNOSIS — N186 End stage renal disease: Secondary | ICD-10-CM | POA: Diagnosis not present

## 2024-01-19 DIAGNOSIS — N186 End stage renal disease: Secondary | ICD-10-CM | POA: Diagnosis not present

## 2024-01-19 DIAGNOSIS — Z992 Dependence on renal dialysis: Secondary | ICD-10-CM | POA: Diagnosis not present

## 2024-01-21 DIAGNOSIS — N186 End stage renal disease: Secondary | ICD-10-CM | POA: Diagnosis not present

## 2024-01-21 DIAGNOSIS — Z992 Dependence on renal dialysis: Secondary | ICD-10-CM | POA: Diagnosis not present

## 2024-01-22 DIAGNOSIS — N186 End stage renal disease: Secondary | ICD-10-CM | POA: Diagnosis not present

## 2024-01-22 DIAGNOSIS — Z992 Dependence on renal dialysis: Secondary | ICD-10-CM | POA: Diagnosis not present

## 2024-01-24 DIAGNOSIS — Z992 Dependence on renal dialysis: Secondary | ICD-10-CM | POA: Diagnosis not present

## 2024-01-24 DIAGNOSIS — N186 End stage renal disease: Secondary | ICD-10-CM | POA: Diagnosis not present

## 2024-01-25 DIAGNOSIS — G4486 Cervicogenic headache: Secondary | ICD-10-CM | POA: Diagnosis not present

## 2024-01-25 DIAGNOSIS — Z8673 Personal history of transient ischemic attack (TIA), and cerebral infarction without residual deficits: Secondary | ICD-10-CM | POA: Diagnosis not present

## 2024-01-25 DIAGNOSIS — R251 Tremor, unspecified: Secondary | ICD-10-CM | POA: Diagnosis not present

## 2024-01-25 DIAGNOSIS — G4440 Drug-induced headache, not elsewhere classified, not intractable: Secondary | ICD-10-CM | POA: Diagnosis not present

## 2024-01-25 DIAGNOSIS — F8081 Childhood onset fluency disorder: Secondary | ICD-10-CM | POA: Diagnosis not present

## 2024-01-25 DIAGNOSIS — G4489 Other headache syndrome: Secondary | ICD-10-CM | POA: Diagnosis not present

## 2024-01-25 DIAGNOSIS — I10 Essential (primary) hypertension: Secondary | ICD-10-CM | POA: Diagnosis not present

## 2024-01-25 DIAGNOSIS — G43909 Migraine, unspecified, not intractable, without status migrainosus: Secondary | ICD-10-CM | POA: Diagnosis not present

## 2024-01-26 DIAGNOSIS — N186 End stage renal disease: Secondary | ICD-10-CM | POA: Diagnosis not present

## 2024-01-26 DIAGNOSIS — Z992 Dependence on renal dialysis: Secondary | ICD-10-CM | POA: Diagnosis not present

## 2024-01-27 DIAGNOSIS — E875 Hyperkalemia: Secondary | ICD-10-CM | POA: Diagnosis not present

## 2024-01-28 DIAGNOSIS — N186 End stage renal disease: Secondary | ICD-10-CM | POA: Diagnosis not present

## 2024-01-28 DIAGNOSIS — Z992 Dependence on renal dialysis: Secondary | ICD-10-CM | POA: Diagnosis not present

## 2024-01-31 DIAGNOSIS — Z992 Dependence on renal dialysis: Secondary | ICD-10-CM | POA: Diagnosis not present

## 2024-01-31 DIAGNOSIS — N186 End stage renal disease: Secondary | ICD-10-CM | POA: Diagnosis not present

## 2024-02-02 DIAGNOSIS — N186 End stage renal disease: Secondary | ICD-10-CM | POA: Diagnosis not present

## 2024-02-02 DIAGNOSIS — Z992 Dependence on renal dialysis: Secondary | ICD-10-CM | POA: Diagnosis not present

## 2024-02-04 DIAGNOSIS — Z992 Dependence on renal dialysis: Secondary | ICD-10-CM | POA: Diagnosis not present

## 2024-02-04 DIAGNOSIS — N186 End stage renal disease: Secondary | ICD-10-CM | POA: Diagnosis not present

## 2024-02-07 DIAGNOSIS — Z992 Dependence on renal dialysis: Secondary | ICD-10-CM | POA: Diagnosis not present

## 2024-02-07 DIAGNOSIS — N186 End stage renal disease: Secondary | ICD-10-CM | POA: Diagnosis not present

## 2024-02-09 DIAGNOSIS — Z992 Dependence on renal dialysis: Secondary | ICD-10-CM | POA: Diagnosis not present

## 2024-02-09 DIAGNOSIS — N186 End stage renal disease: Secondary | ICD-10-CM | POA: Diagnosis not present

## 2024-02-11 DIAGNOSIS — N186 End stage renal disease: Secondary | ICD-10-CM | POA: Diagnosis not present

## 2024-02-11 DIAGNOSIS — Z992 Dependence on renal dialysis: Secondary | ICD-10-CM | POA: Diagnosis not present

## 2024-02-14 DIAGNOSIS — E1122 Type 2 diabetes mellitus with diabetic chronic kidney disease: Secondary | ICD-10-CM | POA: Diagnosis not present

## 2024-02-14 DIAGNOSIS — N186 End stage renal disease: Secondary | ICD-10-CM | POA: Diagnosis not present

## 2024-02-14 DIAGNOSIS — Z992 Dependence on renal dialysis: Secondary | ICD-10-CM | POA: Diagnosis not present

## 2024-02-16 DIAGNOSIS — Z992 Dependence on renal dialysis: Secondary | ICD-10-CM | POA: Diagnosis not present

## 2024-02-16 DIAGNOSIS — N186 End stage renal disease: Secondary | ICD-10-CM | POA: Diagnosis not present

## 2024-02-17 DIAGNOSIS — N186 End stage renal disease: Secondary | ICD-10-CM | POA: Diagnosis not present

## 2024-02-17 DIAGNOSIS — Z992 Dependence on renal dialysis: Secondary | ICD-10-CM | POA: Diagnosis not present

## 2024-02-22 DIAGNOSIS — Z992 Dependence on renal dialysis: Secondary | ICD-10-CM | POA: Diagnosis not present

## 2024-02-22 DIAGNOSIS — N186 End stage renal disease: Secondary | ICD-10-CM | POA: Diagnosis not present

## 2024-02-23 DIAGNOSIS — Z992 Dependence on renal dialysis: Secondary | ICD-10-CM | POA: Diagnosis not present

## 2024-02-23 DIAGNOSIS — N186 End stage renal disease: Secondary | ICD-10-CM | POA: Diagnosis not present

## 2024-02-25 DIAGNOSIS — N186 End stage renal disease: Secondary | ICD-10-CM | POA: Diagnosis not present

## 2024-02-25 DIAGNOSIS — Z992 Dependence on renal dialysis: Secondary | ICD-10-CM | POA: Diagnosis not present

## 2024-02-28 DIAGNOSIS — N186 End stage renal disease: Secondary | ICD-10-CM | POA: Diagnosis not present

## 2024-02-28 DIAGNOSIS — Z992 Dependence on renal dialysis: Secondary | ICD-10-CM | POA: Diagnosis not present

## 2024-02-29 DIAGNOSIS — N186 End stage renal disease: Secondary | ICD-10-CM | POA: Diagnosis not present

## 2024-02-29 DIAGNOSIS — R569 Unspecified convulsions: Secondary | ICD-10-CM | POA: Diagnosis not present

## 2024-02-29 DIAGNOSIS — G43909 Migraine, unspecified, not intractable, without status migrainosus: Secondary | ICD-10-CM | POA: Diagnosis not present

## 2024-02-29 DIAGNOSIS — Z6823 Body mass index (BMI) 23.0-23.9, adult: Secondary | ICD-10-CM | POA: Diagnosis not present

## 2024-02-29 DIAGNOSIS — E1122 Type 2 diabetes mellitus with diabetic chronic kidney disease: Secondary | ICD-10-CM | POA: Diagnosis not present

## 2024-03-01 DIAGNOSIS — N186 End stage renal disease: Secondary | ICD-10-CM | POA: Diagnosis not present

## 2024-03-01 DIAGNOSIS — Z992 Dependence on renal dialysis: Secondary | ICD-10-CM | POA: Diagnosis not present

## 2024-03-03 DIAGNOSIS — Z992 Dependence on renal dialysis: Secondary | ICD-10-CM | POA: Diagnosis not present

## 2024-03-03 DIAGNOSIS — N186 End stage renal disease: Secondary | ICD-10-CM | POA: Diagnosis not present

## 2024-03-06 DIAGNOSIS — Z992 Dependence on renal dialysis: Secondary | ICD-10-CM | POA: Diagnosis not present

## 2024-03-06 DIAGNOSIS — N186 End stage renal disease: Secondary | ICD-10-CM | POA: Diagnosis not present

## 2024-03-08 DIAGNOSIS — N186 End stage renal disease: Secondary | ICD-10-CM | POA: Diagnosis not present

## 2024-03-08 DIAGNOSIS — Z992 Dependence on renal dialysis: Secondary | ICD-10-CM | POA: Diagnosis not present

## 2024-03-10 DIAGNOSIS — N186 End stage renal disease: Secondary | ICD-10-CM | POA: Diagnosis not present

## 2024-03-10 DIAGNOSIS — Z992 Dependence on renal dialysis: Secondary | ICD-10-CM | POA: Diagnosis not present

## 2024-03-13 DIAGNOSIS — Z992 Dependence on renal dialysis: Secondary | ICD-10-CM | POA: Diagnosis not present

## 2024-03-13 DIAGNOSIS — N186 End stage renal disease: Secondary | ICD-10-CM | POA: Diagnosis not present

## 2024-03-15 DIAGNOSIS — N186 End stage renal disease: Secondary | ICD-10-CM | POA: Diagnosis not present

## 2024-03-15 DIAGNOSIS — Z992 Dependence on renal dialysis: Secondary | ICD-10-CM | POA: Diagnosis not present

## 2024-03-20 DIAGNOSIS — N186 End stage renal disease: Secondary | ICD-10-CM | POA: Diagnosis not present

## 2024-03-20 DIAGNOSIS — Z992 Dependence on renal dialysis: Secondary | ICD-10-CM | POA: Diagnosis not present

## 2024-03-22 DIAGNOSIS — N186 End stage renal disease: Secondary | ICD-10-CM | POA: Diagnosis not present

## 2024-03-22 DIAGNOSIS — Z992 Dependence on renal dialysis: Secondary | ICD-10-CM | POA: Diagnosis not present

## 2024-03-22 DIAGNOSIS — D638 Anemia in other chronic diseases classified elsewhere: Secondary | ICD-10-CM | POA: Diagnosis not present

## 2024-03-22 DIAGNOSIS — E1122 Type 2 diabetes mellitus with diabetic chronic kidney disease: Secondary | ICD-10-CM | POA: Diagnosis not present

## 2024-03-22 DIAGNOSIS — R600 Localized edema: Secondary | ICD-10-CM | POA: Diagnosis not present

## 2024-03-22 DIAGNOSIS — Z6823 Body mass index (BMI) 23.0-23.9, adult: Secondary | ICD-10-CM | POA: Diagnosis not present

## 2024-03-22 DIAGNOSIS — M7989 Other specified soft tissue disorders: Secondary | ICD-10-CM | POA: Diagnosis not present

## 2024-03-23 DIAGNOSIS — Z992 Dependence on renal dialysis: Secondary | ICD-10-CM | POA: Diagnosis not present

## 2024-03-23 DIAGNOSIS — N186 End stage renal disease: Secondary | ICD-10-CM | POA: Diagnosis not present

## 2024-03-24 DIAGNOSIS — N186 End stage renal disease: Secondary | ICD-10-CM | POA: Diagnosis not present

## 2024-03-24 DIAGNOSIS — Z992 Dependence on renal dialysis: Secondary | ICD-10-CM | POA: Diagnosis not present

## 2024-03-27 DIAGNOSIS — Z992 Dependence on renal dialysis: Secondary | ICD-10-CM | POA: Diagnosis not present

## 2024-03-27 DIAGNOSIS — N186 End stage renal disease: Secondary | ICD-10-CM | POA: Diagnosis not present

## 2024-03-28 DIAGNOSIS — I081 Rheumatic disorders of both mitral and tricuspid valves: Secondary | ICD-10-CM | POA: Diagnosis not present

## 2024-03-28 DIAGNOSIS — R600 Localized edema: Secondary | ICD-10-CM | POA: Diagnosis not present

## 2024-03-28 DIAGNOSIS — R011 Cardiac murmur, unspecified: Secondary | ICD-10-CM | POA: Diagnosis not present

## 2024-03-28 DIAGNOSIS — N186 End stage renal disease: Secondary | ICD-10-CM | POA: Diagnosis not present

## 2024-03-28 DIAGNOSIS — Z6824 Body mass index (BMI) 24.0-24.9, adult: Secondary | ICD-10-CM | POA: Diagnosis not present

## 2024-03-28 DIAGNOSIS — I3139 Other pericardial effusion (noninflammatory): Secondary | ICD-10-CM | POA: Diagnosis not present

## 2024-03-28 DIAGNOSIS — I272 Pulmonary hypertension, unspecified: Secondary | ICD-10-CM | POA: Diagnosis not present

## 2024-03-28 DIAGNOSIS — E1122 Type 2 diabetes mellitus with diabetic chronic kidney disease: Secondary | ICD-10-CM | POA: Diagnosis not present

## 2024-03-28 DIAGNOSIS — R040 Epistaxis: Secondary | ICD-10-CM | POA: Diagnosis not present

## 2024-03-28 DIAGNOSIS — I371 Nonrheumatic pulmonary valve insufficiency: Secondary | ICD-10-CM | POA: Diagnosis not present

## 2024-03-28 DIAGNOSIS — I351 Nonrheumatic aortic (valve) insufficiency: Secondary | ICD-10-CM | POA: Diagnosis not present

## 2024-03-28 DIAGNOSIS — I361 Nonrheumatic tricuspid (valve) insufficiency: Secondary | ICD-10-CM | POA: Diagnosis not present

## 2024-03-29 DIAGNOSIS — Z992 Dependence on renal dialysis: Secondary | ICD-10-CM | POA: Diagnosis not present

## 2024-03-29 DIAGNOSIS — N186 End stage renal disease: Secondary | ICD-10-CM | POA: Diagnosis not present

## 2024-03-31 DIAGNOSIS — Z992 Dependence on renal dialysis: Secondary | ICD-10-CM | POA: Diagnosis not present

## 2024-03-31 DIAGNOSIS — N186 End stage renal disease: Secondary | ICD-10-CM | POA: Diagnosis not present

## 2024-04-03 DIAGNOSIS — Z992 Dependence on renal dialysis: Secondary | ICD-10-CM | POA: Diagnosis not present

## 2024-04-03 DIAGNOSIS — N186 End stage renal disease: Secondary | ICD-10-CM | POA: Diagnosis not present

## 2024-04-05 DIAGNOSIS — Z992 Dependence on renal dialysis: Secondary | ICD-10-CM | POA: Diagnosis not present

## 2024-04-05 DIAGNOSIS — N186 End stage renal disease: Secondary | ICD-10-CM | POA: Diagnosis not present

## 2024-04-07 DIAGNOSIS — N186 End stage renal disease: Secondary | ICD-10-CM | POA: Diagnosis not present

## 2024-04-07 DIAGNOSIS — Z992 Dependence on renal dialysis: Secondary | ICD-10-CM | POA: Diagnosis not present

## 2024-04-10 DIAGNOSIS — N186 End stage renal disease: Secondary | ICD-10-CM | POA: Diagnosis not present

## 2024-04-10 DIAGNOSIS — Z992 Dependence on renal dialysis: Secondary | ICD-10-CM | POA: Diagnosis not present

## 2024-04-12 DIAGNOSIS — N186 End stage renal disease: Secondary | ICD-10-CM | POA: Diagnosis not present

## 2024-04-12 DIAGNOSIS — Z992 Dependence on renal dialysis: Secondary | ICD-10-CM | POA: Diagnosis not present

## 2024-04-14 DIAGNOSIS — Z992 Dependence on renal dialysis: Secondary | ICD-10-CM | POA: Diagnosis not present

## 2024-04-14 DIAGNOSIS — N186 End stage renal disease: Secondary | ICD-10-CM | POA: Diagnosis not present

## 2024-04-17 DIAGNOSIS — N186 End stage renal disease: Secondary | ICD-10-CM | POA: Diagnosis not present

## 2024-04-17 DIAGNOSIS — Z992 Dependence on renal dialysis: Secondary | ICD-10-CM | POA: Diagnosis not present

## 2024-04-19 DIAGNOSIS — N186 End stage renal disease: Secondary | ICD-10-CM | POA: Diagnosis not present

## 2024-04-19 DIAGNOSIS — Z992 Dependence on renal dialysis: Secondary | ICD-10-CM | POA: Diagnosis not present

## 2024-04-21 DIAGNOSIS — Z992 Dependence on renal dialysis: Secondary | ICD-10-CM | POA: Diagnosis not present

## 2024-04-21 DIAGNOSIS — N186 End stage renal disease: Secondary | ICD-10-CM | POA: Diagnosis not present

## 2024-04-23 DIAGNOSIS — N186 End stage renal disease: Secondary | ICD-10-CM | POA: Diagnosis not present

## 2024-04-23 DIAGNOSIS — Z992 Dependence on renal dialysis: Secondary | ICD-10-CM | POA: Diagnosis not present

## 2024-04-24 DIAGNOSIS — Z992 Dependence on renal dialysis: Secondary | ICD-10-CM | POA: Diagnosis not present

## 2024-04-24 DIAGNOSIS — N186 End stage renal disease: Secondary | ICD-10-CM | POA: Diagnosis not present

## 2024-04-26 DIAGNOSIS — N186 End stage renal disease: Secondary | ICD-10-CM | POA: Diagnosis not present

## 2024-04-26 DIAGNOSIS — Z992 Dependence on renal dialysis: Secondary | ICD-10-CM | POA: Diagnosis not present

## 2024-04-28 DIAGNOSIS — N186 End stage renal disease: Secondary | ICD-10-CM | POA: Diagnosis not present

## 2024-04-28 DIAGNOSIS — Z992 Dependence on renal dialysis: Secondary | ICD-10-CM | POA: Diagnosis not present

## 2024-05-01 DIAGNOSIS — N186 End stage renal disease: Secondary | ICD-10-CM | POA: Diagnosis not present

## 2024-05-01 DIAGNOSIS — Z992 Dependence on renal dialysis: Secondary | ICD-10-CM | POA: Diagnosis not present

## 2024-05-03 DIAGNOSIS — N186 End stage renal disease: Secondary | ICD-10-CM | POA: Diagnosis not present

## 2024-05-03 DIAGNOSIS — Z992 Dependence on renal dialysis: Secondary | ICD-10-CM | POA: Diagnosis not present

## 2024-05-05 DIAGNOSIS — Z992 Dependence on renal dialysis: Secondary | ICD-10-CM | POA: Diagnosis not present

## 2024-05-05 DIAGNOSIS — N186 End stage renal disease: Secondary | ICD-10-CM | POA: Diagnosis not present

## 2024-05-08 DIAGNOSIS — Z992 Dependence on renal dialysis: Secondary | ICD-10-CM | POA: Diagnosis not present

## 2024-05-08 DIAGNOSIS — N186 End stage renal disease: Secondary | ICD-10-CM | POA: Diagnosis not present

## 2024-05-10 DIAGNOSIS — Z992 Dependence on renal dialysis: Secondary | ICD-10-CM | POA: Diagnosis not present

## 2024-05-10 DIAGNOSIS — N186 End stage renal disease: Secondary | ICD-10-CM | POA: Diagnosis not present

## 2024-05-11 DIAGNOSIS — Z992 Dependence on renal dialysis: Secondary | ICD-10-CM | POA: Diagnosis not present

## 2024-05-11 DIAGNOSIS — N186 End stage renal disease: Secondary | ICD-10-CM | POA: Diagnosis not present

## 2024-05-16 DIAGNOSIS — Z992 Dependence on renal dialysis: Secondary | ICD-10-CM | POA: Diagnosis not present

## 2024-05-16 DIAGNOSIS — N186 End stage renal disease: Secondary | ICD-10-CM | POA: Diagnosis not present

## 2024-05-17 DIAGNOSIS — Z992 Dependence on renal dialysis: Secondary | ICD-10-CM | POA: Diagnosis not present

## 2024-05-17 DIAGNOSIS — E1122 Type 2 diabetes mellitus with diabetic chronic kidney disease: Secondary | ICD-10-CM | POA: Diagnosis not present

## 2024-05-17 DIAGNOSIS — N186 End stage renal disease: Secondary | ICD-10-CM | POA: Diagnosis not present

## 2024-05-19 DIAGNOSIS — N186 End stage renal disease: Secondary | ICD-10-CM | POA: Diagnosis not present

## 2024-05-19 DIAGNOSIS — Z992 Dependence on renal dialysis: Secondary | ICD-10-CM | POA: Diagnosis not present

## 2024-05-22 DIAGNOSIS — Z992 Dependence on renal dialysis: Secondary | ICD-10-CM | POA: Diagnosis not present

## 2024-05-22 DIAGNOSIS — N186 End stage renal disease: Secondary | ICD-10-CM | POA: Diagnosis not present

## 2024-05-23 DIAGNOSIS — Z992 Dependence on renal dialysis: Secondary | ICD-10-CM | POA: Diagnosis not present

## 2024-05-23 DIAGNOSIS — N186 End stage renal disease: Secondary | ICD-10-CM | POA: Diagnosis not present

## 2024-05-24 DIAGNOSIS — N186 End stage renal disease: Secondary | ICD-10-CM | POA: Diagnosis not present

## 2024-05-24 DIAGNOSIS — Z992 Dependence on renal dialysis: Secondary | ICD-10-CM | POA: Diagnosis not present

## 2024-05-26 DIAGNOSIS — N186 End stage renal disease: Secondary | ICD-10-CM | POA: Diagnosis not present

## 2024-05-26 DIAGNOSIS — Z992 Dependence on renal dialysis: Secondary | ICD-10-CM | POA: Diagnosis not present

## 2024-05-29 DIAGNOSIS — Z1159 Encounter for screening for other viral diseases: Secondary | ICD-10-CM | POA: Diagnosis not present

## 2024-05-29 DIAGNOSIS — Z992 Dependence on renal dialysis: Secondary | ICD-10-CM | POA: Diagnosis not present

## 2024-05-29 DIAGNOSIS — Z114 Encounter for screening for human immunodeficiency virus [HIV]: Secondary | ICD-10-CM | POA: Diagnosis not present

## 2024-05-29 DIAGNOSIS — N186 End stage renal disease: Secondary | ICD-10-CM | POA: Diagnosis not present

## 2024-05-31 DIAGNOSIS — N186 End stage renal disease: Secondary | ICD-10-CM | POA: Diagnosis not present

## 2024-05-31 DIAGNOSIS — Z992 Dependence on renal dialysis: Secondary | ICD-10-CM | POA: Diagnosis not present

## 2024-06-01 DIAGNOSIS — R269 Unspecified abnormalities of gait and mobility: Secondary | ICD-10-CM | POA: Diagnosis not present

## 2024-06-01 DIAGNOSIS — M6281 Muscle weakness (generalized): Secondary | ICD-10-CM | POA: Diagnosis not present

## 2024-06-02 DIAGNOSIS — N186 End stage renal disease: Secondary | ICD-10-CM | POA: Diagnosis not present

## 2024-06-02 DIAGNOSIS — Z992 Dependence on renal dialysis: Secondary | ICD-10-CM | POA: Diagnosis not present

## 2024-06-05 DIAGNOSIS — N186 End stage renal disease: Secondary | ICD-10-CM | POA: Diagnosis not present

## 2024-06-05 DIAGNOSIS — Z992 Dependence on renal dialysis: Secondary | ICD-10-CM | POA: Diagnosis not present

## 2024-06-06 DIAGNOSIS — M25552 Pain in left hip: Secondary | ICD-10-CM | POA: Diagnosis not present

## 2024-06-06 DIAGNOSIS — M6281 Muscle weakness (generalized): Secondary | ICD-10-CM | POA: Diagnosis not present

## 2024-06-07 DIAGNOSIS — N186 End stage renal disease: Secondary | ICD-10-CM | POA: Diagnosis not present

## 2024-06-07 DIAGNOSIS — Z992 Dependence on renal dialysis: Secondary | ICD-10-CM | POA: Diagnosis not present

## 2024-06-08 DIAGNOSIS — M6281 Muscle weakness (generalized): Secondary | ICD-10-CM | POA: Diagnosis not present

## 2024-06-09 DIAGNOSIS — N186 End stage renal disease: Secondary | ICD-10-CM | POA: Diagnosis not present

## 2024-06-09 DIAGNOSIS — Z992 Dependence on renal dialysis: Secondary | ICD-10-CM | POA: Diagnosis not present

## 2024-06-12 DIAGNOSIS — Z992 Dependence on renal dialysis: Secondary | ICD-10-CM | POA: Diagnosis not present

## 2024-06-12 DIAGNOSIS — N186 End stage renal disease: Secondary | ICD-10-CM | POA: Diagnosis not present

## 2024-06-13 DIAGNOSIS — M6281 Muscle weakness (generalized): Secondary | ICD-10-CM | POA: Diagnosis not present

## 2024-06-14 DIAGNOSIS — N186 End stage renal disease: Secondary | ICD-10-CM | POA: Diagnosis not present

## 2024-06-14 DIAGNOSIS — Z992 Dependence on renal dialysis: Secondary | ICD-10-CM | POA: Diagnosis not present

## 2024-06-15 DIAGNOSIS — M6281 Muscle weakness (generalized): Secondary | ICD-10-CM | POA: Diagnosis not present

## 2024-06-16 DIAGNOSIS — N186 End stage renal disease: Secondary | ICD-10-CM | POA: Diagnosis not present

## 2024-06-16 DIAGNOSIS — Z992 Dependence on renal dialysis: Secondary | ICD-10-CM | POA: Diagnosis not present

## 2024-06-19 DIAGNOSIS — Z992 Dependence on renal dialysis: Secondary | ICD-10-CM | POA: Diagnosis not present

## 2024-06-19 DIAGNOSIS — N186 End stage renal disease: Secondary | ICD-10-CM | POA: Diagnosis not present

## 2024-06-20 DIAGNOSIS — M6281 Muscle weakness (generalized): Secondary | ICD-10-CM | POA: Diagnosis not present

## 2024-06-21 DIAGNOSIS — N186 End stage renal disease: Secondary | ICD-10-CM | POA: Diagnosis not present

## 2024-06-21 DIAGNOSIS — Z992 Dependence on renal dialysis: Secondary | ICD-10-CM | POA: Diagnosis not present

## 2024-06-22 DIAGNOSIS — M6281 Muscle weakness (generalized): Secondary | ICD-10-CM | POA: Diagnosis not present

## 2024-06-22 DIAGNOSIS — N186 End stage renal disease: Secondary | ICD-10-CM | POA: Diagnosis not present

## 2024-06-22 DIAGNOSIS — Z992 Dependence on renal dialysis: Secondary | ICD-10-CM | POA: Diagnosis not present

## 2024-06-23 DIAGNOSIS — Z992 Dependence on renal dialysis: Secondary | ICD-10-CM | POA: Diagnosis not present

## 2024-06-23 DIAGNOSIS — N186 End stage renal disease: Secondary | ICD-10-CM | POA: Diagnosis not present

## 2024-06-26 DIAGNOSIS — N186 End stage renal disease: Secondary | ICD-10-CM | POA: Diagnosis not present

## 2024-06-26 DIAGNOSIS — Z992 Dependence on renal dialysis: Secondary | ICD-10-CM | POA: Diagnosis not present

## 2024-06-27 DIAGNOSIS — M6281 Muscle weakness (generalized): Secondary | ICD-10-CM | POA: Diagnosis not present

## 2024-06-28 DIAGNOSIS — Z992 Dependence on renal dialysis: Secondary | ICD-10-CM | POA: Diagnosis not present

## 2024-06-28 DIAGNOSIS — N186 End stage renal disease: Secondary | ICD-10-CM | POA: Diagnosis not present

## 2024-06-29 DIAGNOSIS — K2100 Gastro-esophageal reflux disease with esophagitis, without bleeding: Secondary | ICD-10-CM | POA: Diagnosis not present

## 2024-06-29 DIAGNOSIS — E1129 Type 2 diabetes mellitus with other diabetic kidney complication: Secondary | ICD-10-CM | POA: Diagnosis not present

## 2024-06-29 DIAGNOSIS — E7849 Other hyperlipidemia: Secondary | ICD-10-CM | POA: Diagnosis not present

## 2024-06-29 DIAGNOSIS — M6281 Muscle weakness (generalized): Secondary | ICD-10-CM | POA: Diagnosis not present

## 2024-06-29 DIAGNOSIS — N184 Chronic kidney disease, stage 4 (severe): Secondary | ICD-10-CM | POA: Diagnosis not present

## 2024-06-30 DIAGNOSIS — Z992 Dependence on renal dialysis: Secondary | ICD-10-CM | POA: Diagnosis not present

## 2024-06-30 DIAGNOSIS — N186 End stage renal disease: Secondary | ICD-10-CM | POA: Diagnosis not present

## 2024-07-03 DIAGNOSIS — Z1331 Encounter for screening for depression: Secondary | ICD-10-CM | POA: Diagnosis not present

## 2024-07-03 DIAGNOSIS — Z992 Dependence on renal dialysis: Secondary | ICD-10-CM | POA: Diagnosis not present

## 2024-07-03 DIAGNOSIS — N186 End stage renal disease: Secondary | ICD-10-CM | POA: Diagnosis not present

## 2024-07-03 DIAGNOSIS — E782 Mixed hyperlipidemia: Secondary | ICD-10-CM | POA: Diagnosis not present

## 2024-07-03 DIAGNOSIS — Z86718 Personal history of other venous thrombosis and embolism: Secondary | ICD-10-CM | POA: Diagnosis not present

## 2024-07-03 DIAGNOSIS — Z0001 Encounter for general adult medical examination with abnormal findings: Secondary | ICD-10-CM | POA: Diagnosis not present

## 2024-07-03 DIAGNOSIS — Z23 Encounter for immunization: Secondary | ICD-10-CM | POA: Diagnosis not present

## 2024-07-03 DIAGNOSIS — E1122 Type 2 diabetes mellitus with diabetic chronic kidney disease: Secondary | ICD-10-CM | POA: Diagnosis not present

## 2024-07-03 DIAGNOSIS — Z1389 Encounter for screening for other disorder: Secondary | ICD-10-CM | POA: Diagnosis not present

## 2024-07-03 DIAGNOSIS — Z0000 Encounter for general adult medical examination without abnormal findings: Secondary | ICD-10-CM | POA: Diagnosis not present

## 2024-07-05 DIAGNOSIS — N186 End stage renal disease: Secondary | ICD-10-CM | POA: Diagnosis not present

## 2024-07-05 DIAGNOSIS — Z992 Dependence on renal dialysis: Secondary | ICD-10-CM | POA: Diagnosis not present

## 2024-07-07 DIAGNOSIS — Z992 Dependence on renal dialysis: Secondary | ICD-10-CM | POA: Diagnosis not present

## 2024-07-07 DIAGNOSIS — N186 End stage renal disease: Secondary | ICD-10-CM | POA: Diagnosis not present

## 2024-07-10 DIAGNOSIS — Z992 Dependence on renal dialysis: Secondary | ICD-10-CM | POA: Diagnosis not present

## 2024-07-10 DIAGNOSIS — N186 End stage renal disease: Secondary | ICD-10-CM | POA: Diagnosis not present

## 2024-07-12 DIAGNOSIS — Z992 Dependence on renal dialysis: Secondary | ICD-10-CM | POA: Diagnosis not present

## 2024-07-12 DIAGNOSIS — N186 End stage renal disease: Secondary | ICD-10-CM | POA: Diagnosis not present

## 2024-07-14 DIAGNOSIS — N186 End stage renal disease: Secondary | ICD-10-CM | POA: Diagnosis not present

## 2024-07-14 DIAGNOSIS — Z992 Dependence on renal dialysis: Secondary | ICD-10-CM | POA: Diagnosis not present

## 2024-07-17 DIAGNOSIS — N186 End stage renal disease: Secondary | ICD-10-CM | POA: Diagnosis not present

## 2024-07-17 DIAGNOSIS — Z992 Dependence on renal dialysis: Secondary | ICD-10-CM | POA: Diagnosis not present

## 2024-07-18 DIAGNOSIS — M6281 Muscle weakness (generalized): Secondary | ICD-10-CM | POA: Diagnosis not present

## 2024-07-19 DIAGNOSIS — Z992 Dependence on renal dialysis: Secondary | ICD-10-CM | POA: Diagnosis not present

## 2024-07-19 DIAGNOSIS — N186 End stage renal disease: Secondary | ICD-10-CM | POA: Diagnosis not present

## 2024-07-20 DIAGNOSIS — M6281 Muscle weakness (generalized): Secondary | ICD-10-CM | POA: Diagnosis not present

## 2024-07-21 DIAGNOSIS — Z992 Dependence on renal dialysis: Secondary | ICD-10-CM | POA: Diagnosis not present

## 2024-07-21 DIAGNOSIS — N186 End stage renal disease: Secondary | ICD-10-CM | POA: Diagnosis not present

## 2024-07-24 DIAGNOSIS — N186 End stage renal disease: Secondary | ICD-10-CM | POA: Diagnosis not present

## 2024-07-24 DIAGNOSIS — Z992 Dependence on renal dialysis: Secondary | ICD-10-CM | POA: Diagnosis not present

## 2024-07-25 DIAGNOSIS — M6281 Muscle weakness (generalized): Secondary | ICD-10-CM | POA: Diagnosis not present

## 2024-07-26 DIAGNOSIS — N186 End stage renal disease: Secondary | ICD-10-CM | POA: Diagnosis not present

## 2024-07-26 DIAGNOSIS — Z992 Dependence on renal dialysis: Secondary | ICD-10-CM | POA: Diagnosis not present

## 2024-07-28 DIAGNOSIS — N186 End stage renal disease: Secondary | ICD-10-CM | POA: Diagnosis not present

## 2024-07-28 DIAGNOSIS — Z992 Dependence on renal dialysis: Secondary | ICD-10-CM | POA: Diagnosis not present

## 2024-07-31 DIAGNOSIS — N186 End stage renal disease: Secondary | ICD-10-CM | POA: Diagnosis not present

## 2024-07-31 DIAGNOSIS — Z992 Dependence on renal dialysis: Secondary | ICD-10-CM | POA: Diagnosis not present

## 2024-08-02 DIAGNOSIS — N186 End stage renal disease: Secondary | ICD-10-CM | POA: Diagnosis not present

## 2024-08-02 DIAGNOSIS — Z992 Dependence on renal dialysis: Secondary | ICD-10-CM | POA: Diagnosis not present

## 2024-08-04 DIAGNOSIS — Z992 Dependence on renal dialysis: Secondary | ICD-10-CM | POA: Diagnosis not present

## 2024-08-04 DIAGNOSIS — N186 End stage renal disease: Secondary | ICD-10-CM | POA: Diagnosis not present

## 2024-08-07 DIAGNOSIS — I160 Hypertensive urgency: Secondary | ICD-10-CM | POA: Diagnosis not present

## 2024-08-07 DIAGNOSIS — R519 Headache, unspecified: Secondary | ICD-10-CM | POA: Diagnosis not present

## 2024-08-07 DIAGNOSIS — Z7189 Other specified counseling: Secondary | ICD-10-CM | POA: Diagnosis not present

## 2024-08-07 DIAGNOSIS — N186 End stage renal disease: Secondary | ICD-10-CM | POA: Diagnosis not present

## 2024-08-07 DIAGNOSIS — Z992 Dependence on renal dialysis: Secondary | ICD-10-CM | POA: Diagnosis not present

## 2024-08-08 DIAGNOSIS — M6281 Muscle weakness (generalized): Secondary | ICD-10-CM | POA: Diagnosis not present

## 2024-08-09 DIAGNOSIS — Z992 Dependence on renal dialysis: Secondary | ICD-10-CM | POA: Diagnosis not present

## 2024-08-09 DIAGNOSIS — N186 End stage renal disease: Secondary | ICD-10-CM | POA: Diagnosis not present

## 2024-08-10 DIAGNOSIS — M6281 Muscle weakness (generalized): Secondary | ICD-10-CM | POA: Diagnosis not present

## 2024-08-11 DIAGNOSIS — N186 End stage renal disease: Secondary | ICD-10-CM | POA: Diagnosis not present

## 2024-08-11 DIAGNOSIS — Z992 Dependence on renal dialysis: Secondary | ICD-10-CM | POA: Diagnosis not present

## 2024-08-14 DIAGNOSIS — N186 End stage renal disease: Secondary | ICD-10-CM | POA: Diagnosis not present

## 2024-08-14 DIAGNOSIS — E119 Type 2 diabetes mellitus without complications: Secondary | ICD-10-CM | POA: Diagnosis not present

## 2024-08-14 DIAGNOSIS — Z992 Dependence on renal dialysis: Secondary | ICD-10-CM | POA: Diagnosis not present

## 2024-08-14 DIAGNOSIS — E1122 Type 2 diabetes mellitus with diabetic chronic kidney disease: Secondary | ICD-10-CM | POA: Diagnosis not present

## 2024-08-16 DIAGNOSIS — Z992 Dependence on renal dialysis: Secondary | ICD-10-CM | POA: Diagnosis not present

## 2024-08-16 DIAGNOSIS — N186 End stage renal disease: Secondary | ICD-10-CM | POA: Diagnosis not present

## 2024-08-18 DIAGNOSIS — Z992 Dependence on renal dialysis: Secondary | ICD-10-CM | POA: Diagnosis not present

## 2024-08-18 DIAGNOSIS — N186 End stage renal disease: Secondary | ICD-10-CM | POA: Diagnosis not present

## 2024-08-21 DIAGNOSIS — Z992 Dependence on renal dialysis: Secondary | ICD-10-CM | POA: Diagnosis not present

## 2024-08-21 DIAGNOSIS — N186 End stage renal disease: Secondary | ICD-10-CM | POA: Diagnosis not present

## 2024-08-22 DIAGNOSIS — M6281 Muscle weakness (generalized): Secondary | ICD-10-CM | POA: Diagnosis not present

## 2024-08-23 DIAGNOSIS — N186 End stage renal disease: Secondary | ICD-10-CM | POA: Diagnosis not present

## 2024-08-23 DIAGNOSIS — E8779 Other fluid overload: Secondary | ICD-10-CM | POA: Diagnosis not present

## 2024-08-23 DIAGNOSIS — Z992 Dependence on renal dialysis: Secondary | ICD-10-CM | POA: Diagnosis not present

## 2024-08-24 DIAGNOSIS — M6281 Muscle weakness (generalized): Secondary | ICD-10-CM | POA: Diagnosis not present

## 2024-08-28 DIAGNOSIS — E8779 Other fluid overload: Secondary | ICD-10-CM | POA: Diagnosis not present

## 2024-08-28 DIAGNOSIS — N186 End stage renal disease: Secondary | ICD-10-CM | POA: Diagnosis not present

## 2024-08-28 DIAGNOSIS — Z992 Dependence on renal dialysis: Secondary | ICD-10-CM | POA: Diagnosis not present

## 2024-08-29 DIAGNOSIS — I340 Nonrheumatic mitral (valve) insufficiency: Secondary | ICD-10-CM | POA: Diagnosis not present

## 2024-08-29 DIAGNOSIS — Z992 Dependence on renal dialysis: Secondary | ICD-10-CM | POA: Diagnosis not present

## 2024-08-29 DIAGNOSIS — Z8673 Personal history of transient ischemic attack (TIA), and cerebral infarction without residual deficits: Secondary | ICD-10-CM | POA: Diagnosis not present

## 2024-08-29 DIAGNOSIS — I499 Cardiac arrhythmia, unspecified: Secondary | ICD-10-CM | POA: Diagnosis not present

## 2024-08-29 DIAGNOSIS — I10 Essential (primary) hypertension: Secondary | ICD-10-CM | POA: Diagnosis not present

## 2024-08-29 DIAGNOSIS — N186 End stage renal disease: Secondary | ICD-10-CM | POA: Diagnosis not present

## 2024-09-01 DIAGNOSIS — N186 End stage renal disease: Secondary | ICD-10-CM | POA: Diagnosis not present

## 2024-09-05 DIAGNOSIS — M6281 Muscle weakness (generalized): Secondary | ICD-10-CM | POA: Diagnosis not present

## 2024-09-08 DIAGNOSIS — Z992 Dependence on renal dialysis: Secondary | ICD-10-CM | POA: Diagnosis not present

## 2024-09-08 DIAGNOSIS — E8779 Other fluid overload: Secondary | ICD-10-CM | POA: Diagnosis not present

## 2024-09-11 DIAGNOSIS — N186 End stage renal disease: Secondary | ICD-10-CM | POA: Diagnosis not present

## 2024-09-12 DIAGNOSIS — I3139 Other pericardial effusion (noninflammatory): Secondary | ICD-10-CM | POA: Diagnosis not present

## 2024-09-12 DIAGNOSIS — I083 Combined rheumatic disorders of mitral, aortic and tricuspid valves: Secondary | ICD-10-CM | POA: Diagnosis not present

## 2024-09-12 DIAGNOSIS — I272 Pulmonary hypertension, unspecified: Secondary | ICD-10-CM | POA: Diagnosis not present

## 2024-09-13 DIAGNOSIS — Z992 Dependence on renal dialysis: Secondary | ICD-10-CM | POA: Diagnosis not present

## 2024-09-14 DIAGNOSIS — M6281 Muscle weakness (generalized): Secondary | ICD-10-CM | POA: Diagnosis not present

## 2024-09-18 DIAGNOSIS — N186 End stage renal disease: Secondary | ICD-10-CM | POA: Diagnosis not present

## 2024-09-18 DIAGNOSIS — Z992 Dependence on renal dialysis: Secondary | ICD-10-CM | POA: Diagnosis not present

## 2024-09-19 DIAGNOSIS — M6281 Muscle weakness (generalized): Secondary | ICD-10-CM | POA: Diagnosis not present

## 2024-09-20 DIAGNOSIS — E8779 Other fluid overload: Secondary | ICD-10-CM | POA: Diagnosis not present

## 2024-09-20 DIAGNOSIS — Z992 Dependence on renal dialysis: Secondary | ICD-10-CM | POA: Diagnosis not present

## 2024-09-20 DIAGNOSIS — N186 End stage renal disease: Secondary | ICD-10-CM | POA: Diagnosis not present

## 2024-09-23 DIAGNOSIS — E8779 Other fluid overload: Secondary | ICD-10-CM | POA: Diagnosis not present

## 2024-09-23 DIAGNOSIS — N186 End stage renal disease: Secondary | ICD-10-CM | POA: Diagnosis not present

## 2024-09-23 DIAGNOSIS — Z992 Dependence on renal dialysis: Secondary | ICD-10-CM | POA: Diagnosis not present

## 2024-09-25 DIAGNOSIS — N186 End stage renal disease: Secondary | ICD-10-CM | POA: Diagnosis not present

## 2024-09-26 DIAGNOSIS — M6281 Muscle weakness (generalized): Secondary | ICD-10-CM | POA: Diagnosis not present

## 2024-09-27 DIAGNOSIS — N186 End stage renal disease: Secondary | ICD-10-CM | POA: Diagnosis not present

## 2024-09-27 DIAGNOSIS — Z992 Dependence on renal dialysis: Secondary | ICD-10-CM | POA: Diagnosis not present

## 2024-09-27 DIAGNOSIS — E8779 Other fluid overload: Secondary | ICD-10-CM | POA: Diagnosis not present

## 2024-09-28 DIAGNOSIS — M6281 Muscle weakness (generalized): Secondary | ICD-10-CM | POA: Diagnosis not present

## 2024-10-02 DIAGNOSIS — N186 End stage renal disease: Secondary | ICD-10-CM | POA: Diagnosis not present

## 2024-10-04 DIAGNOSIS — N186 End stage renal disease: Secondary | ICD-10-CM | POA: Diagnosis not present

## 2024-10-05 DIAGNOSIS — N186 End stage renal disease: Secondary | ICD-10-CM | POA: Diagnosis not present

## 2024-10-05 DIAGNOSIS — Z992 Dependence on renal dialysis: Secondary | ICD-10-CM | POA: Diagnosis not present

## 2024-10-08 DIAGNOSIS — N186 End stage renal disease: Secondary | ICD-10-CM | POA: Diagnosis not present

## 2024-10-09 DIAGNOSIS — E8779 Other fluid overload: Secondary | ICD-10-CM | POA: Diagnosis not present

## 2024-10-09 DIAGNOSIS — Z992 Dependence on renal dialysis: Secondary | ICD-10-CM | POA: Diagnosis not present

## 2024-10-09 DIAGNOSIS — N186 End stage renal disease: Secondary | ICD-10-CM | POA: Diagnosis not present

## 2024-10-10 DIAGNOSIS — M6281 Muscle weakness (generalized): Secondary | ICD-10-CM | POA: Diagnosis not present

## 2024-10-11 DIAGNOSIS — Z992 Dependence on renal dialysis: Secondary | ICD-10-CM | POA: Diagnosis not present

## 2024-10-11 DIAGNOSIS — N186 End stage renal disease: Secondary | ICD-10-CM | POA: Diagnosis not present

## 2024-10-16 DIAGNOSIS — Z992 Dependence on renal dialysis: Secondary | ICD-10-CM | POA: Diagnosis not present

## 2024-10-16 DIAGNOSIS — N186 End stage renal disease: Secondary | ICD-10-CM | POA: Diagnosis not present

## 2024-10-18 DIAGNOSIS — N186 End stage renal disease: Secondary | ICD-10-CM | POA: Diagnosis not present

## 2024-10-23 DIAGNOSIS — E785 Hyperlipidemia, unspecified: Secondary | ICD-10-CM | POA: Diagnosis not present

## 2024-10-23 DIAGNOSIS — Z992 Dependence on renal dialysis: Secondary | ICD-10-CM | POA: Diagnosis not present

## 2024-10-23 DIAGNOSIS — E1122 Type 2 diabetes mellitus with diabetic chronic kidney disease: Secondary | ICD-10-CM | POA: Diagnosis not present

## 2024-10-23 DIAGNOSIS — I1311 Hypertensive heart and chronic kidney disease without heart failure, with stage 5 chronic kidney disease, or end stage renal disease: Secondary | ICD-10-CM | POA: Diagnosis not present

## 2024-10-23 DIAGNOSIS — E1142 Type 2 diabetes mellitus with diabetic polyneuropathy: Secondary | ICD-10-CM | POA: Diagnosis not present

## 2024-10-23 DIAGNOSIS — I4891 Unspecified atrial fibrillation: Secondary | ICD-10-CM | POA: Diagnosis not present

## 2024-10-23 DIAGNOSIS — D6869 Other thrombophilia: Secondary | ICD-10-CM | POA: Diagnosis not present

## 2024-10-23 DIAGNOSIS — N186 End stage renal disease: Secondary | ICD-10-CM | POA: Diagnosis not present

## 2024-10-23 DIAGNOSIS — I951 Orthostatic hypotension: Secondary | ICD-10-CM | POA: Diagnosis not present

## 2024-11-21 ENCOUNTER — Other Ambulatory Visit (HOSPITAL_COMMUNITY): Payer: Self-pay | Admitting: Internal Medicine

## 2024-11-21 DIAGNOSIS — K862 Cyst of pancreas: Secondary | ICD-10-CM

## 2024-11-28 ENCOUNTER — Other Ambulatory Visit (HOSPITAL_COMMUNITY): Payer: Self-pay | Admitting: Internal Medicine

## 2024-11-28 ENCOUNTER — Ambulatory Visit (HOSPITAL_COMMUNITY)
Admission: RE | Admit: 2024-11-28 | Discharge: 2024-11-28 | Disposition: A | Discharge status: Home / Self Care | Source: Ambulatory Visit | Attending: Internal Medicine | Admitting: Internal Medicine

## 2024-11-28 DIAGNOSIS — K862 Cyst of pancreas: Secondary | ICD-10-CM

## 2024-12-08 ENCOUNTER — Encounter: Payer: Self-pay | Admitting: Internal Medicine

## 2024-12-28 ENCOUNTER — Ambulatory Visit: Admitting: Internal Medicine
